# Patient Record
Sex: Female | Born: 1945
Health system: Southern US, Community
[De-identification: ages and names within clinical notes are randomized; demographics above are authoritative.]

## PROBLEM LIST (undated history)

## (undated) DIAGNOSIS — M1990 Unspecified osteoarthritis, unspecified site: Secondary | ICD-10-CM

## (undated) DIAGNOSIS — Z5189 Encounter for other specified aftercare: Secondary | ICD-10-CM

## (undated) DIAGNOSIS — J189 Pneumonia, unspecified organism: Secondary | ICD-10-CM

## (undated) DIAGNOSIS — F419 Anxiety disorder, unspecified: Secondary | ICD-10-CM

## (undated) DIAGNOSIS — K5730 Diverticulosis of large intestine without perforation or abscess without bleeding: Secondary | ICD-10-CM

## (undated) DIAGNOSIS — H04129 Dry eye syndrome of unspecified lacrimal gland: Secondary | ICD-10-CM

## (undated) DIAGNOSIS — G510 Bell's palsy: Secondary | ICD-10-CM

## (undated) DIAGNOSIS — Z780 Asymptomatic menopausal state: Secondary | ICD-10-CM

## (undated) DIAGNOSIS — I2510 Atherosclerotic heart disease of native coronary artery without angina pectoris: Secondary | ICD-10-CM

## (undated) DIAGNOSIS — N3010 Interstitial cystitis (chronic) without hematuria: Secondary | ICD-10-CM

## (undated) DIAGNOSIS — G4730 Sleep apnea, unspecified: Secondary | ICD-10-CM

## (undated) DIAGNOSIS — K219 Gastro-esophageal reflux disease without esophagitis: Secondary | ICD-10-CM

## (undated) DIAGNOSIS — K117 Disturbances of salivary secretion: Secondary | ICD-10-CM

## (undated) DIAGNOSIS — T7840XA Allergy, unspecified, initial encounter: Secondary | ICD-10-CM

## (undated) DIAGNOSIS — E119 Type 2 diabetes mellitus without complications: Secondary | ICD-10-CM

## (undated) DIAGNOSIS — M459 Ankylosing spondylitis of unspecified sites in spine: Secondary | ICD-10-CM

## (undated) DIAGNOSIS — N200 Calculus of kidney: Secondary | ICD-10-CM

## (undated) DIAGNOSIS — M7500 Adhesive capsulitis of unspecified shoulder: Secondary | ICD-10-CM

## (undated) DIAGNOSIS — M8580 Other specified disorders of bone density and structure, unspecified site: Secondary | ICD-10-CM

## (undated) DIAGNOSIS — Z90710 Acquired absence of both cervix and uterus: Secondary | ICD-10-CM

## (undated) DIAGNOSIS — I77810 Thoracic aortic ectasia: Secondary | ICD-10-CM

## (undated) DIAGNOSIS — Z87442 Personal history of urinary calculi: Secondary | ICD-10-CM

## (undated) DIAGNOSIS — I10 Essential (primary) hypertension: Secondary | ICD-10-CM

## (undated) DIAGNOSIS — I471 Supraventricular tachycardia, unspecified: Secondary | ICD-10-CM

## (undated) DIAGNOSIS — R7303 Prediabetes: Secondary | ICD-10-CM

## (undated) DIAGNOSIS — Z90722 Acquired absence of ovaries, bilateral: Secondary | ICD-10-CM

## (undated) DIAGNOSIS — Z860100 Personal history of colon polyps, unspecified: Secondary | ICD-10-CM

## (undated) DIAGNOSIS — J329 Chronic sinusitis, unspecified: Secondary | ICD-10-CM

## (undated) DIAGNOSIS — H269 Unspecified cataract: Secondary | ICD-10-CM

## (undated) DIAGNOSIS — G5600 Carpal tunnel syndrome, unspecified upper limb: Secondary | ICD-10-CM

## (undated) DIAGNOSIS — R32 Unspecified urinary incontinence: Secondary | ICD-10-CM

## (undated) DIAGNOSIS — Z1589 Genetic susceptibility to other disease: Secondary | ICD-10-CM

## (undated) DIAGNOSIS — I351 Nonrheumatic aortic (valve) insufficiency: Secondary | ICD-10-CM

## (undated) DIAGNOSIS — E785 Hyperlipidemia, unspecified: Secondary | ICD-10-CM

## (undated) DIAGNOSIS — R682 Dry mouth, unspecified: Secondary | ICD-10-CM

## (undated) DIAGNOSIS — K8681 Exocrine pancreatic insufficiency: Secondary | ICD-10-CM

## (undated) DIAGNOSIS — D126 Benign neoplasm of colon, unspecified: Secondary | ICD-10-CM

## (undated) DIAGNOSIS — E039 Hypothyroidism, unspecified: Secondary | ICD-10-CM

## (undated) DIAGNOSIS — K648 Other hemorrhoids: Secondary | ICD-10-CM

## (undated) DIAGNOSIS — N3090 Cystitis, unspecified without hematuria: Secondary | ICD-10-CM

## (undated) DIAGNOSIS — H5310 Unspecified subjective visual disturbances: Principal | ICD-10-CM

## (undated) DIAGNOSIS — Z9079 Acquired absence of other genital organ(s): Secondary | ICD-10-CM

## (undated) DIAGNOSIS — K5792 Diverticulitis of intestine, part unspecified, without perforation or abscess without bleeding: Secondary | ICD-10-CM

## (undated) HISTORY — DX: Supraventricular tachycardia, unspecified: I47.10

## (undated) HISTORY — PX: SHOULDER ARTHROSCOPY: SHX128

## (undated) HISTORY — DX: Interstitial cystitis (chronic) without hematuria: N30.10

## (undated) HISTORY — DX: Exocrine pancreatic insufficiency: K86.81

## (undated) HISTORY — DX: Type 2 diabetes mellitus without complications: E11.9

## (undated) HISTORY — DX: Atherosclerotic heart disease of native coronary artery without angina pectoris: I25.10

## (undated) HISTORY — DX: Anxiety disorder, unspecified: F41.9

## (undated) HISTORY — DX: Personal history of colon polyps, unspecified: Z86.0100

## (undated) HISTORY — DX: Hyperlipidemia, unspecified: E78.5

## (undated) HISTORY — DX: Essential (primary) hypertension: I10

## (undated) HISTORY — PX: TOTAL ABDOMINAL HYSTERECTOMY W/ BILATERAL SALPINGOOPHORECTOMY: SHX83

## (undated) HISTORY — DX: Allergy, unspecified, initial encounter: T78.40XA

## (undated) HISTORY — DX: Diverticulosis of large intestine without perforation or abscess without bleeding: K57.30

## (undated) HISTORY — DX: Personal history of colon polyps: Z86.010

## (undated) HISTORY — DX: Sleep apnea, unspecified: G47.30

## (undated) HISTORY — DX: Supraventricular tachycardia: I47.1

## (undated) HISTORY — DX: Encounter for other specified aftercare: Z51.89

## (undated) HISTORY — DX: Acquired absence of ovaries, bilateral: Z90.722

## (undated) HISTORY — DX: Acquired absence of both cervix and uterus: Z90.710

## (undated) HISTORY — DX: Acquired absence of other genital organ(s): Z90.79

## (undated) HISTORY — DX: Other hemorrhoids: K64.8

## (undated) HISTORY — DX: Carpal tunnel syndrome, unspecified upper limb: G56.00

## (undated) HISTORY — DX: Unspecified subjective visual disturbances: H53.10

## (undated) HISTORY — DX: Other specified disorders of bone density and structure, unspecified site: M85.80

## (undated) HISTORY — DX: Benign neoplasm of colon, unspecified: D12.6

## (undated) HISTORY — DX: Thoracic aortic ectasia: I77.810

## (undated) HISTORY — DX: Disturbances of salivary secretion: K11.7

## (undated) HISTORY — PX: PARTIAL HYSTERECTOMY: SHX80

## (undated) HISTORY — DX: Chronic sinusitis, unspecified: J32.9

## (undated) HISTORY — PX: COLONOSCOPY W/ POLYPECTOMY: SHX1380

## (undated) HISTORY — PX: OTHER SURGICAL HISTORY: SHX169

## (undated) HISTORY — DX: Unspecified cataract: H26.9

## (undated) HISTORY — PX: BLADDER SURGERY: SHX569

## (undated) HISTORY — DX: Gastro-esophageal reflux disease without esophagitis: K21.9

## (undated) HISTORY — DX: Asymptomatic menopausal state: Z78.0

## (undated) HISTORY — DX: Dry mouth, unspecified: R68.2

## (undated) HISTORY — DX: Nonrheumatic aortic (valve) insufficiency: I35.1

---

## 1998-08-03 ENCOUNTER — Encounter: Payer: Self-pay | Admitting: *Deleted

## 1998-08-03 ENCOUNTER — Ambulatory Visit (HOSPITAL_COMMUNITY): Admission: RE | Admit: 1998-08-03 | Discharge: 1998-08-03 | Payer: Self-pay | Admitting: *Deleted

## 1999-11-08 ENCOUNTER — Encounter: Payer: Self-pay | Admitting: Family Medicine

## 1999-11-08 ENCOUNTER — Ambulatory Visit (HOSPITAL_COMMUNITY): Admission: RE | Admit: 1999-11-08 | Discharge: 1999-11-08 | Payer: Self-pay | Admitting: Family Medicine

## 2000-01-25 ENCOUNTER — Encounter (INDEPENDENT_AMBULATORY_CARE_PROVIDER_SITE_OTHER): Payer: Self-pay | Admitting: *Deleted

## 2000-01-25 ENCOUNTER — Ambulatory Visit (HOSPITAL_BASED_OUTPATIENT_CLINIC_OR_DEPARTMENT_OTHER): Admission: RE | Admit: 2000-01-25 | Discharge: 2000-01-25 | Payer: Self-pay | Admitting: Orthopedic Surgery

## 2002-07-31 HISTORY — PX: FINGER SURGERY: SHX640

## 2003-08-03 ENCOUNTER — Encounter: Admission: RE | Admit: 2003-08-03 | Discharge: 2003-08-03 | Payer: Self-pay | Admitting: Family Medicine

## 2003-12-08 ENCOUNTER — Encounter: Payer: Self-pay | Admitting: Internal Medicine

## 2004-02-16 ENCOUNTER — Encounter: Admission: RE | Admit: 2004-02-16 | Discharge: 2004-02-16 | Payer: Self-pay | Admitting: Internal Medicine

## 2004-02-22 ENCOUNTER — Ambulatory Visit (HOSPITAL_COMMUNITY): Admission: RE | Admit: 2004-02-22 | Discharge: 2004-02-22 | Payer: Self-pay | Admitting: Gastroenterology

## 2004-03-04 ENCOUNTER — Other Ambulatory Visit: Admission: RE | Admit: 2004-03-04 | Discharge: 2004-03-04 | Payer: Self-pay | Admitting: Obstetrics and Gynecology

## 2004-04-01 ENCOUNTER — Encounter (INDEPENDENT_AMBULATORY_CARE_PROVIDER_SITE_OTHER): Payer: Self-pay | Admitting: *Deleted

## 2004-04-01 ENCOUNTER — Ambulatory Visit (HOSPITAL_COMMUNITY): Admission: RE | Admit: 2004-04-01 | Discharge: 2004-04-01 | Payer: Self-pay | Admitting: Urology

## 2004-04-01 ENCOUNTER — Ambulatory Visit (HOSPITAL_BASED_OUTPATIENT_CLINIC_OR_DEPARTMENT_OTHER): Admission: RE | Admit: 2004-04-01 | Discharge: 2004-04-01 | Payer: Self-pay | Admitting: Urology

## 2004-04-29 ENCOUNTER — Emergency Department (HOSPITAL_COMMUNITY): Admission: EM | Admit: 2004-04-29 | Discharge: 2004-04-29 | Payer: Self-pay | Admitting: Emergency Medicine

## 2004-05-01 ENCOUNTER — Emergency Department (HOSPITAL_COMMUNITY): Admission: EM | Admit: 2004-05-01 | Discharge: 2004-05-01 | Payer: Self-pay | Admitting: Emergency Medicine

## 2004-05-02 ENCOUNTER — Inpatient Hospital Stay (HOSPITAL_COMMUNITY): Admission: EM | Admit: 2004-05-02 | Discharge: 2004-05-06 | Payer: Self-pay | Admitting: Urology

## 2004-06-09 ENCOUNTER — Ambulatory Visit: Payer: Self-pay | Admitting: Internal Medicine

## 2004-08-13 ENCOUNTER — Ambulatory Visit: Payer: Self-pay | Admitting: Family Medicine

## 2004-10-01 ENCOUNTER — Ambulatory Visit: Payer: Self-pay | Admitting: Internal Medicine

## 2004-11-01 ENCOUNTER — Ambulatory Visit: Payer: Self-pay | Admitting: Internal Medicine

## 2004-11-22 ENCOUNTER — Ambulatory Visit: Payer: Self-pay | Admitting: Internal Medicine

## 2005-02-20 ENCOUNTER — Encounter: Admission: RE | Admit: 2005-02-20 | Discharge: 2005-02-20 | Payer: Self-pay | Admitting: Internal Medicine

## 2005-03-14 ENCOUNTER — Ambulatory Visit: Payer: Self-pay | Admitting: Internal Medicine

## 2005-03-16 ENCOUNTER — Other Ambulatory Visit: Admission: RE | Admit: 2005-03-16 | Discharge: 2005-03-16 | Payer: Self-pay | Admitting: Obstetrics and Gynecology

## 2005-03-17 ENCOUNTER — Ambulatory Visit: Payer: Self-pay | Admitting: Internal Medicine

## 2005-04-13 ENCOUNTER — Encounter: Admission: RE | Admit: 2005-04-13 | Discharge: 2005-07-12 | Payer: Self-pay | Admitting: Internal Medicine

## 2005-05-02 ENCOUNTER — Encounter: Payer: Self-pay | Admitting: Internal Medicine

## 2005-05-05 ENCOUNTER — Ambulatory Visit: Payer: Self-pay | Admitting: Internal Medicine

## 2005-05-15 ENCOUNTER — Ambulatory Visit: Payer: Self-pay | Admitting: Internal Medicine

## 2005-08-31 ENCOUNTER — Ambulatory Visit: Payer: Self-pay | Admitting: Internal Medicine

## 2005-11-21 ENCOUNTER — Ambulatory Visit: Payer: Self-pay | Admitting: Internal Medicine

## 2006-02-28 ENCOUNTER — Encounter: Admission: RE | Admit: 2006-02-28 | Discharge: 2006-02-28 | Payer: Self-pay | Admitting: Internal Medicine

## 2006-03-23 ENCOUNTER — Ambulatory Visit: Payer: Self-pay | Admitting: Internal Medicine

## 2006-05-23 ENCOUNTER — Ambulatory Visit: Payer: Self-pay | Admitting: Internal Medicine

## 2006-06-07 ENCOUNTER — Ambulatory Visit: Payer: Self-pay | Admitting: Internal Medicine

## 2006-06-19 ENCOUNTER — Ambulatory Visit: Payer: Self-pay | Admitting: Internal Medicine

## 2006-07-03 ENCOUNTER — Ambulatory Visit: Payer: Self-pay | Admitting: Internal Medicine

## 2006-07-23 ENCOUNTER — Ambulatory Visit: Payer: Self-pay | Admitting: Internal Medicine

## 2006-07-23 LAB — CONVERTED CEMR LAB
ALT: 32 units/L (ref 0–40)
AST: 27 units/L (ref 0–37)
BUN: 14 mg/dL (ref 6–23)
Calcium: 8.9 mg/dL (ref 8.4–10.5)
Cholesterol: 193 mg/dL (ref 0–200)
Glucose, Bld: 99 mg/dL (ref 70–99)
HCT: 39.7 % (ref 36.0–46.0)
Hemoglobin: 13.2 g/dL (ref 12.0–15.0)
Hgb A1c MFr Bld: 5.8 % (ref 4.6–6.0)
Lymphocytes Relative: 34.5 % (ref 12.0–46.0)
MCHC: 33.2 g/dL (ref 30.0–36.0)
MCV: 89.5 fL (ref 78.0–100.0)
Microalb Creat Ratio: 3.8 mg/g (ref 0.0–30.0)
Monocytes Absolute: 0.4 10*3/uL (ref 0.2–0.7)
Monocytes Relative: 9 % (ref 3.0–11.0)
Neutrophils Relative %: 52.5 % (ref 43.0–77.0)
Potassium: 4 meq/L (ref 3.5–5.1)
RDW: 12 % (ref 11.5–14.6)
Sodium: 144 meq/L (ref 135–145)
Total Bilirubin: 0.7 mg/dL (ref 0.3–1.2)
WBC: 4.8 10*3/uL (ref 4.5–10.5)

## 2006-08-16 ENCOUNTER — Ambulatory Visit: Payer: Self-pay | Admitting: Internal Medicine

## 2006-12-08 ENCOUNTER — Ambulatory Visit: Payer: Self-pay | Admitting: Family Medicine

## 2006-12-20 ENCOUNTER — Encounter: Payer: Self-pay | Admitting: Internal Medicine

## 2006-12-20 ENCOUNTER — Ambulatory Visit: Payer: Self-pay | Admitting: Internal Medicine

## 2007-01-25 ENCOUNTER — Ambulatory Visit: Payer: Self-pay | Admitting: Internal Medicine

## 2007-01-25 DIAGNOSIS — M5416 Radiculopathy, lumbar region: Secondary | ICD-10-CM | POA: Insufficient documentation

## 2007-01-28 ENCOUNTER — Encounter: Admission: RE | Admit: 2007-01-28 | Discharge: 2007-01-28 | Payer: Self-pay | Admitting: Internal Medicine

## 2007-01-28 ENCOUNTER — Telehealth (INDEPENDENT_AMBULATORY_CARE_PROVIDER_SITE_OTHER): Payer: Self-pay | Admitting: *Deleted

## 2007-02-04 ENCOUNTER — Encounter: Payer: Self-pay | Admitting: Internal Medicine

## 2007-02-05 ENCOUNTER — Encounter (INDEPENDENT_AMBULATORY_CARE_PROVIDER_SITE_OTHER): Payer: Self-pay | Admitting: *Deleted

## 2007-02-08 ENCOUNTER — Telehealth: Payer: Self-pay | Admitting: Internal Medicine

## 2007-03-05 ENCOUNTER — Encounter: Admission: RE | Admit: 2007-03-05 | Discharge: 2007-03-05 | Payer: Self-pay | Admitting: Internal Medicine

## 2007-04-05 ENCOUNTER — Encounter: Admission: RE | Admit: 2007-04-05 | Discharge: 2007-04-05 | Payer: Self-pay | Admitting: Obstetrics and Gynecology

## 2007-05-29 ENCOUNTER — Ambulatory Visit: Payer: Self-pay | Admitting: Internal Medicine

## 2007-06-03 ENCOUNTER — Telehealth (INDEPENDENT_AMBULATORY_CARE_PROVIDER_SITE_OTHER): Payer: Self-pay | Admitting: *Deleted

## 2007-06-05 ENCOUNTER — Telehealth (INDEPENDENT_AMBULATORY_CARE_PROVIDER_SITE_OTHER): Payer: Self-pay | Admitting: *Deleted

## 2007-06-26 ENCOUNTER — Ambulatory Visit: Payer: Self-pay | Admitting: Internal Medicine

## 2007-07-10 ENCOUNTER — Ambulatory Visit: Payer: Self-pay | Admitting: Internal Medicine

## 2007-07-10 LAB — CONVERTED CEMR LAB
Blood in Urine, dipstick: NEGATIVE
Ketones, urine, test strip: NEGATIVE
Nitrite: NEGATIVE
Protein, U semiquant: NEGATIVE
Urobilinogen, UA: 0.2

## 2007-08-21 ENCOUNTER — Ambulatory Visit: Payer: Self-pay | Admitting: Internal Medicine

## 2007-08-21 DIAGNOSIS — E1169 Type 2 diabetes mellitus with other specified complication: Secondary | ICD-10-CM | POA: Insufficient documentation

## 2007-08-21 DIAGNOSIS — I10 Essential (primary) hypertension: Secondary | ICD-10-CM | POA: Insufficient documentation

## 2007-08-21 DIAGNOSIS — M2550 Pain in unspecified joint: Secondary | ICD-10-CM | POA: Insufficient documentation

## 2007-08-21 DIAGNOSIS — I152 Hypertension secondary to endocrine disorders: Secondary | ICD-10-CM | POA: Insufficient documentation

## 2007-08-21 DIAGNOSIS — E785 Hyperlipidemia, unspecified: Secondary | ICD-10-CM | POA: Insufficient documentation

## 2007-08-21 DIAGNOSIS — E782 Mixed hyperlipidemia: Secondary | ICD-10-CM

## 2007-08-22 ENCOUNTER — Encounter (INDEPENDENT_AMBULATORY_CARE_PROVIDER_SITE_OTHER): Payer: Self-pay | Admitting: *Deleted

## 2007-09-03 ENCOUNTER — Encounter (INDEPENDENT_AMBULATORY_CARE_PROVIDER_SITE_OTHER): Payer: Self-pay | Admitting: *Deleted

## 2007-09-03 ENCOUNTER — Ambulatory Visit: Payer: Self-pay | Admitting: Internal Medicine

## 2007-09-25 ENCOUNTER — Ambulatory Visit: Payer: Self-pay | Admitting: Internal Medicine

## 2008-01-10 ENCOUNTER — Inpatient Hospital Stay (HOSPITAL_COMMUNITY): Admission: EM | Admit: 2008-01-10 | Discharge: 2008-01-10 | Payer: Self-pay | Admitting: Emergency Medicine

## 2008-01-10 ENCOUNTER — Ambulatory Visit: Payer: Self-pay | Admitting: Internal Medicine

## 2008-01-15 ENCOUNTER — Ambulatory Visit: Payer: Self-pay | Admitting: Internal Medicine

## 2008-01-15 DIAGNOSIS — M940 Chondrocostal junction syndrome [Tietze]: Secondary | ICD-10-CM | POA: Insufficient documentation

## 2008-03-05 ENCOUNTER — Encounter: Admission: RE | Admit: 2008-03-05 | Discharge: 2008-03-05 | Payer: Self-pay | Admitting: Obstetrics and Gynecology

## 2008-04-03 ENCOUNTER — Telehealth: Payer: Self-pay | Admitting: Internal Medicine

## 2008-04-03 ENCOUNTER — Telehealth: Payer: Self-pay | Admitting: Gastroenterology

## 2008-04-15 ENCOUNTER — Ambulatory Visit: Payer: Self-pay | Admitting: Internal Medicine

## 2008-04-21 ENCOUNTER — Ambulatory Visit: Payer: Self-pay | Admitting: Gastroenterology

## 2008-05-08 ENCOUNTER — Telehealth: Payer: Self-pay | Admitting: Gastroenterology

## 2008-05-12 ENCOUNTER — Encounter: Payer: Self-pay | Admitting: Gastroenterology

## 2008-05-12 ENCOUNTER — Ambulatory Visit: Payer: Self-pay | Admitting: Gastroenterology

## 2008-05-12 LAB — HM COLONOSCOPY

## 2008-05-14 ENCOUNTER — Encounter: Payer: Self-pay | Admitting: Gastroenterology

## 2008-05-15 ENCOUNTER — Telehealth (INDEPENDENT_AMBULATORY_CARE_PROVIDER_SITE_OTHER): Payer: Self-pay | Admitting: *Deleted

## 2008-06-02 ENCOUNTER — Telehealth: Payer: Self-pay | Admitting: Gastroenterology

## 2008-11-27 ENCOUNTER — Encounter: Payer: Self-pay | Admitting: Internal Medicine

## 2009-01-11 ENCOUNTER — Encounter: Payer: Self-pay | Admitting: Internal Medicine

## 2009-02-03 ENCOUNTER — Telehealth (INDEPENDENT_AMBULATORY_CARE_PROVIDER_SITE_OTHER): Payer: Self-pay | Admitting: *Deleted

## 2009-03-08 ENCOUNTER — Encounter: Admission: RE | Admit: 2009-03-08 | Discharge: 2009-03-08 | Payer: Self-pay | Admitting: Internal Medicine

## 2009-03-24 ENCOUNTER — Encounter: Payer: Self-pay | Admitting: Internal Medicine

## 2009-04-12 ENCOUNTER — Encounter: Payer: Self-pay | Admitting: Internal Medicine

## 2009-04-15 ENCOUNTER — Encounter: Payer: Self-pay | Admitting: Internal Medicine

## 2009-04-30 ENCOUNTER — Ambulatory Visit: Payer: Self-pay | Admitting: Internal Medicine

## 2009-04-30 DIAGNOSIS — K219 Gastro-esophageal reflux disease without esophagitis: Secondary | ICD-10-CM | POA: Insufficient documentation

## 2009-04-30 DIAGNOSIS — Z86010 Personal history of colonic polyps: Secondary | ICD-10-CM | POA: Insufficient documentation

## 2009-04-30 DIAGNOSIS — M8580 Other specified disorders of bone density and structure, unspecified site: Secondary | ICD-10-CM | POA: Insufficient documentation

## 2009-04-30 DIAGNOSIS — K5730 Diverticulosis of large intestine without perforation or abscess without bleeding: Secondary | ICD-10-CM | POA: Insufficient documentation

## 2009-05-04 ENCOUNTER — Encounter (INDEPENDENT_AMBULATORY_CARE_PROVIDER_SITE_OTHER): Payer: Self-pay | Admitting: *Deleted

## 2009-05-06 ENCOUNTER — Encounter: Admission: RE | Admit: 2009-05-06 | Discharge: 2009-05-06 | Payer: Self-pay | Admitting: Internal Medicine

## 2009-05-06 ENCOUNTER — Encounter: Payer: Self-pay | Admitting: Internal Medicine

## 2009-05-07 ENCOUNTER — Encounter (INDEPENDENT_AMBULATORY_CARE_PROVIDER_SITE_OTHER): Payer: Self-pay | Admitting: *Deleted

## 2009-05-11 ENCOUNTER — Ambulatory Visit: Payer: Self-pay | Admitting: Internal Medicine

## 2009-05-24 ENCOUNTER — Encounter: Payer: Self-pay | Admitting: Internal Medicine

## 2009-05-25 ENCOUNTER — Ambulatory Visit: Payer: Self-pay | Admitting: Internal Medicine

## 2009-05-25 DIAGNOSIS — R109 Unspecified abdominal pain: Secondary | ICD-10-CM | POA: Insufficient documentation

## 2009-05-27 ENCOUNTER — Telehealth (INDEPENDENT_AMBULATORY_CARE_PROVIDER_SITE_OTHER): Payer: Self-pay | Admitting: *Deleted

## 2009-05-27 ENCOUNTER — Encounter (INDEPENDENT_AMBULATORY_CARE_PROVIDER_SITE_OTHER): Payer: Self-pay | Admitting: *Deleted

## 2009-05-27 LAB — CONVERTED CEMR LAB
Basophils Relative: 0.9 % (ref 0.0–3.0)
HCT: 40.3 % (ref 36.0–46.0)
Hemoglobin: 13.8 g/dL (ref 12.0–15.0)
Lymphocytes Relative: 33.3 % (ref 12.0–46.0)
MCHC: 34.3 g/dL (ref 30.0–36.0)
MCV: 90.8 fL (ref 78.0–100.0)
Neutro Abs: 3.6 10*3/uL (ref 1.4–7.7)
RDW: 12.2 % (ref 11.5–14.6)

## 2009-05-31 ENCOUNTER — Encounter (INDEPENDENT_AMBULATORY_CARE_PROVIDER_SITE_OTHER): Payer: Self-pay | Admitting: *Deleted

## 2009-05-31 ENCOUNTER — Ambulatory Visit: Payer: Self-pay | Admitting: Internal Medicine

## 2009-05-31 LAB — CONVERTED CEMR LAB
OCCULT 2: NEGATIVE
OCCULT 3: NEGATIVE

## 2009-06-25 ENCOUNTER — Ambulatory Visit: Payer: Self-pay | Admitting: Internal Medicine

## 2009-07-01 ENCOUNTER — Telehealth: Payer: Self-pay | Admitting: Internal Medicine

## 2009-07-05 ENCOUNTER — Encounter: Payer: Self-pay | Admitting: Internal Medicine

## 2009-07-12 ENCOUNTER — Ambulatory Visit: Payer: Self-pay | Admitting: Internal Medicine

## 2009-07-12 DIAGNOSIS — M542 Cervicalgia: Secondary | ICD-10-CM | POA: Insufficient documentation

## 2009-07-13 ENCOUNTER — Encounter (INDEPENDENT_AMBULATORY_CARE_PROVIDER_SITE_OTHER): Payer: Self-pay | Admitting: *Deleted

## 2009-07-20 ENCOUNTER — Encounter: Payer: Self-pay | Admitting: Internal Medicine

## 2009-07-20 ENCOUNTER — Encounter: Admission: RE | Admit: 2009-07-20 | Discharge: 2009-07-29 | Payer: Self-pay | Admitting: Internal Medicine

## 2009-07-29 ENCOUNTER — Encounter: Admission: RE | Admit: 2009-07-29 | Discharge: 2009-09-09 | Payer: Self-pay | Admitting: Geriatric Medicine

## 2009-08-16 ENCOUNTER — Ambulatory Visit: Payer: Self-pay | Admitting: Internal Medicine

## 2009-08-16 DIAGNOSIS — M545 Low back pain, unspecified: Secondary | ICD-10-CM | POA: Insufficient documentation

## 2009-08-16 LAB — CONVERTED CEMR LAB
Blood in Urine, dipstick: NEGATIVE
Glucose, Urine, Semiquant: NEGATIVE
Protein, U semiquant: NEGATIVE
Specific Gravity, Urine: 1.02
Urobilinogen, UA: 0.2
pH: 6

## 2009-08-24 ENCOUNTER — Encounter: Admission: RE | Admit: 2009-08-24 | Discharge: 2009-09-09 | Payer: Self-pay | Admitting: Internal Medicine

## 2009-09-02 ENCOUNTER — Encounter: Payer: Self-pay | Admitting: Internal Medicine

## 2009-10-27 ENCOUNTER — Encounter: Payer: Self-pay | Admitting: Internal Medicine

## 2009-11-03 ENCOUNTER — Encounter: Payer: Self-pay | Admitting: Internal Medicine

## 2009-11-12 ENCOUNTER — Encounter: Admission: RE | Admit: 2009-11-12 | Discharge: 2009-11-12 | Payer: Self-pay | Admitting: Obstetrics and Gynecology

## 2009-11-12 ENCOUNTER — Ambulatory Visit: Payer: Self-pay | Admitting: Internal Medicine

## 2009-11-15 LAB — CONVERTED CEMR LAB: AST: 30 units/L (ref 0–37)

## 2009-12-03 ENCOUNTER — Ambulatory Visit: Payer: Self-pay | Admitting: Internal Medicine

## 2009-12-03 DIAGNOSIS — B353 Tinea pedis: Secondary | ICD-10-CM | POA: Insufficient documentation

## 2009-12-13 ENCOUNTER — Encounter: Payer: Self-pay | Admitting: Internal Medicine

## 2010-02-04 ENCOUNTER — Telehealth (INDEPENDENT_AMBULATORY_CARE_PROVIDER_SITE_OTHER): Payer: Self-pay | Admitting: *Deleted

## 2010-03-16 ENCOUNTER — Encounter: Admission: RE | Admit: 2010-03-16 | Discharge: 2010-03-16 | Payer: Self-pay | Admitting: Obstetrics and Gynecology

## 2010-04-28 ENCOUNTER — Encounter: Payer: Self-pay | Admitting: Internal Medicine

## 2010-05-03 ENCOUNTER — Ambulatory Visit: Payer: Self-pay | Admitting: Internal Medicine

## 2010-06-01 ENCOUNTER — Encounter: Payer: Self-pay | Admitting: Internal Medicine

## 2010-06-27 ENCOUNTER — Encounter: Payer: Self-pay | Admitting: Internal Medicine

## 2010-06-27 ENCOUNTER — Ambulatory Visit: Payer: Self-pay | Admitting: Internal Medicine

## 2010-06-27 DIAGNOSIS — K117 Disturbances of salivary secretion: Secondary | ICD-10-CM | POA: Insufficient documentation

## 2010-06-27 DIAGNOSIS — E559 Vitamin D deficiency, unspecified: Secondary | ICD-10-CM | POA: Insufficient documentation

## 2010-06-27 DIAGNOSIS — H04129 Dry eye syndrome of unspecified lacrimal gland: Secondary | ICD-10-CM | POA: Insufficient documentation

## 2010-06-27 DIAGNOSIS — M25519 Pain in unspecified shoulder: Secondary | ICD-10-CM | POA: Insufficient documentation

## 2010-06-27 LAB — CONVERTED CEMR LAB
Cholesterol, target level: 200 mg/dL
HDL goal, serum: 40 mg/dL
LDL Goal: 130 mg/dL

## 2010-06-29 LAB — CONVERTED CEMR LAB
ALT: 35 units/L (ref 0–35)
Alkaline Phosphatase: 80 units/L (ref 39–117)
BUN: 22 mg/dL (ref 6–23)
Bilirubin, Direct: 0.1 mg/dL (ref 0.0–0.3)
CO2: 28 meq/L (ref 19–32)
Calcium: 9.7 mg/dL (ref 8.4–10.5)
Hemoglobin: 12.9 g/dL (ref 12.0–15.0)
Lymphocytes Relative: 32.5 % (ref 12.0–46.0)
MCHC: 34.6 g/dL (ref 30.0–36.0)
Monocytes Absolute: 0.5 10*3/uL (ref 0.1–1.0)
Monocytes Relative: 7.6 % (ref 3.0–12.0)
Neutrophils Relative %: 56.4 % (ref 43.0–77.0)
Sed Rate: 15 mm/hr (ref 0–22)
Sodium: 143 meq/L (ref 135–145)
TSH: 3.08 microintl units/mL (ref 0.35–5.50)
Total Bilirubin: 0.6 mg/dL (ref 0.3–1.2)

## 2010-08-17 ENCOUNTER — Encounter: Payer: Self-pay | Admitting: Internal Medicine

## 2010-08-28 LAB — CONVERTED CEMR LAB
ALT: 63 units/L — ABNORMAL HIGH (ref 0–35)
AST: 45 units/L — ABNORMAL HIGH (ref 0–37)
Albumin: 4 g/dL (ref 3.5–5.2)
Albumin: 4.4 g/dL (ref 3.5–5.2)
Alkaline Phosphatase: 90 units/L (ref 39–117)
BUN: 15 mg/dL (ref 6–23)
BUN: 16 mg/dL (ref 6–23)
Bilirubin, Direct: 0.1 mg/dL (ref 0.0–0.3)
CO2: 29 meq/L (ref 19–32)
Calcium: 9.6 mg/dL (ref 8.4–10.5)
Chloride: 107 meq/L (ref 96–112)
Cholesterol: 209 mg/dL (ref 0–200)
Creatinine, Ser: 1.1 mg/dL (ref 0.4–1.2)
Direct LDL: 122.9 mg/dL
Eosinophils Absolute: 0.2 10*3/uL (ref 0.0–0.6)
Eosinophils Absolute: 0.2 10*3/uL (ref 0.0–0.7)
Eosinophils Relative: 2.5 % (ref 0.0–5.0)
GFR calc Af Amer: 81 mL/min
Glucose, Bld: 100 mg/dL — ABNORMAL HIGH (ref 70–99)
Glucose, Bld: 94 mg/dL (ref 70–99)
HCT: 39.3 % (ref 36.0–46.0)
HDL: 44.9 mg/dL (ref >39.0)
Hemoglobin: 13.1 g/dL (ref 12.0–15.0)
Hgb A1c MFr Bld: 5.8 % (ref 4.6–6.5)
Hgb A1c MFr Bld: 6.2 % — ABNORMAL HIGH (ref 4.6–6.0)
MCHC: 34.2 g/dL (ref 30.0–36.0)
Monocytes Absolute: 0.4 10*3/uL (ref 0.1–1.0)
Monocytes Relative: 6.6 % (ref 3.0–11.0)
Monocytes Relative: 7.1 % (ref 3.0–12.0)
Neutro Abs: 3.4 10*3/uL (ref 1.4–7.7)
Neutro Abs: 3.6 10*3/uL (ref 1.4–7.7)
Neutrophils Relative %: 61.6 % (ref 43.0–77.0)
Platelets: 260 10*3/uL (ref 150.0–400.0)
RDW: 11.9 % (ref 11.5–14.6)
RDW: 11.9 % (ref 11.5–14.6)
Rhuematoid fact SerPl-aCnc: 49.1 intl units/mL — ABNORMAL HIGH (ref 0.0–20.0)
TSH: 2.09 microintl units/mL (ref 0.35–5.50)
Total CHOL/HDL Ratio: 4.7
Total Protein: 7.1 g/dL (ref 6.0–8.3)
Total Protein: 7.9 g/dL (ref 6.0–8.3)
Triglycerides: 273 mg/dL (ref 0–149)
VLDL: 55 mg/dL — ABNORMAL HIGH (ref 0–40)

## 2010-09-01 NOTE — Letter (Signed)
Summary: [facility]   Imported By: Lanelle Bal 12/08/2009 14:03:28  _____________________________________________________________________  External Attachment:    Type:   Image     Comment:   External Document

## 2010-09-01 NOTE — Letter (Signed)
Summary: CMN for TENS Unit/[employer]  CMN for TENS Unit/Medical [facility]   Imported By: Lanelle Bal 12/21/2009 12:02:01  _____________________________________________________________________  External Attachment:    Type:   Image     Comment:   External Document

## 2010-09-01 NOTE — Letter (Signed)
Summary: [facility]   Imported By: Lanelle Bal 12/08/2009 13:11:35  _____________________________________________________________________  External Attachment:    Type:   Image     Comment:   External Document

## 2010-09-01 NOTE — Assessment & Plan Note (Signed)
Summary: YEARLY EXAM----WILL TRY TO FAST///SPH   Vital Signs:  Patient profile:   65 year old female Height:      62.5 inches Weight:      172.4 pounds BMI:     31.14 Temp:     97.6 degrees F oral Pulse rate:   60 / minute Resp:     14 per minute BP sitting:   128 / 82  (left arm) Cuff size:   large  Vitals Entered By: Shonna Chock CMA (June 27, 2010 1:07 PM) CC: CPX with fasting labs , Lipid Management, Shoulder pain   Primary Care Cadan Maggart:  Marga Melnick, MD  CC:  CPX with fasting labs , Lipid Management, and Shoulder pain.  History of Present Illness:      Here for Medicare AWV: 1.Risk factors based on Past M, S, F history:HTN; Dyslipidemia ; GERD; Osteopenia ( chart updated). New issue is  shoulder pain( actually recurring issue). 2.Physical Activities:walking irregularly  3.Depression/mood: positive responses to screening questions ( see scanned document) 4.Hearing::  intermittent issues especially in crowds(see responses) 5.ADL's: no limitations 6.Fall Risk: none 7.Home Safety: no issues 8.Height, weight, &visual acuity:wall chart read @ 6 ft 9.Counseling: POA & Living Will discussed 10.Labs ordered based on risk factors: see Orders 11. Referral Coordination: Audiology referral discussed; re-evaluation by Derm for eyelid & feet issues recommended 12.Care Plan: see Instructions 13. Cognitive Assessment: Orient ed X 3; memory & recall  excellent ;"WORLD " spelled backwards; mood & affect normal.       Hyperlipidemia Follow-Up   :The patient reports muscle aches, flushing, and fatigue, but denies itching, constipation, and diarrhea.  Dietary compliance has been good  to  fair ( "emotional eater").  Adjunctive measures currently used by the patient include ASA.  Lipids 04/28/2010  from Gyn reviewed  : LDL 119 (NMR goal = < 130. Shoulder Pain      The patient also presents with Shoulder pain, exacerbated in past month ; but originally in 1999  immediately post  oophorectomy & bladder repair.  The patient reports numbness, weakness, &  tingling in both hands and impaired  L shouder ROM, but denies locking, stiffness, swelling, and redness.   The pain began acutely and without injury other than possibly positioning for surgery.  The patient describes the pain as constant and aching.  The pain is better with NSAIDS.  The pain is worse with activity, specifically reaching in certain directions..    Lipid Management History:      Positive NCEP/ATP III risk factors include female age 71 years old or older and hypertension.  Negative NCEP/ATP III risk factors include no history of early menopause without estrogen hormone replacement, non-diabetic, no family history for ischemic heart disease, non-tobacco-user status, no ASHD (atherosclerotic heart disease), no prior stroke/TIA, no peripheral vascular disease, and no history of aortic aneurysm.     Preventive Screening-Counseling & Management  Alcohol-Tobacco     Alcohol drinks/day: 0     Smoking Status: never  Caffeine-Diet-Exercise     Caffeine use/day: tea 4 cups / day, usuay decaf     Does Patient Exercise: no  Hep-HIV-STD-Contraception     Dental Visit-last 6 months yes     Sun Exposure-Excessive: no  Safety-Violence-Falls     Seat Belt Use: yes     Smoke Detectors: yes      Blood Transfusions:  after 2001 and post cystoscopy UTI with severe hematuria 2005.        Travel History:  [place] 1990.    Current Medications (verified): 1)  Singulair 10 Mg Tabs (Montelukast Sodium) .... Take One Tablet By Mouth Daily 2)  Toprol Xl 50 Mg Tb24 (Metoprolol Succinate) .... Take One Half Tablet By Mouth Daily 3)  Azelastine Hcl 137 Mcg/spray Soln (Azelastine Hcl) .Marland Kitchen.. 1-2 Sprays Once Daily 4)  Aspirin Ec 81 Mg Tbec (Aspirin) .... Take 1 Tablet By Mouth Every Morning 5)  Furosemide 40 Mg  Tabs (Furosemide) .... 1/2 Tablet Daily 6)  Advil 200 Mg Caps (Ibuprofen) .... As Needed 7)  Citracal/vit D 1000units  .... 2 By Mouth Once Daily 8)  Vagifem 10 Mcg Tabs (Estradiol) .Marland Kitchen.. 1-2 X Weekly 9)  Align  Caps (Probiotic Product) .Marland Kitchen.. 1 By Mouth Once Daily As Needed 10)  Lidoderm 5 % Ptch (Lidocaine) .Marland Kitchen.. 12 Hours On, 12 Hours Off As Needed 11)  Protopic 0.1 % Oint (Tacrolimus) .... Apply To Eyelids As Needed 12)  Vitamin E 1000 Unit Caps (Vitamin E) .Marland Kitchen.. 1 By Mouth Once Daily 13)  Ketoconazole 2 % Crea (Ketoconazole) .... Apply Two Times A Day To Feet  Allergies: 1)  ! Phenergan 2)  ! * Nitro 3)  ! * Lisinopril 4)  ! * Elmiron 5)  ! Septra  Past History:  Past Medical History: Hypertension back pain with radiculopathy, PMH of + RA factor 2004 , Dr Jimmy Footman, Rheu allergies GERD Hyperlipidemia: NMR Lipoprofile 2005: LDL 138( 1393/501), HDL 49, TG 168. LDL goal = < 130.  [place] Study LDL goal = < 130. Osteopenia Colonic polyps, PMH  of Diverticulosis, colon Interstitial Cystitis ; DDD LS spine; Xerostomia  & Xeroophthalmia ; Vitamin D deficiency, Dr Arelia Sneddon  Past Surgical History: Hysterectomy with bilateral oophorectomy for dysfunctional menses bladder surgery for incontinence gravida  one para one colonoscopy: tics 10/2002, interstitial cystitis with clot in catheter elevated LFTs  with elmiron Colon polypectomy, adenomatous poyp   04/2008, Dr  Wendall Papa  Endoscopy 04/2008 : negative  Family History: Father: cirrhosis (non alcoholic),DM Mother: CAD,HTN, osteoporosis,asthma,dementia, thyroid disease,mini CVAs Siblings: sister colon cancer;2  P aunts:  renal cancer;CAD in maternal FH  Social History: Never Smoked No  specific diet married, one daughter, retired Runner, broadcasting/film/video, does not drink alcohol. Regular exercise-no Does Patient Exercise:  no Caffeine use/day:  tea 4 cups / day, usuay decaf Dental Care w/in 6 mos.:  yes Sun Exposure-Excessive:  no Seat Belt Use:  yes Blood Transfusions:  after 2001, post cystoscopy UTI with severe hematuria 2005  Review of Systems        The patient complains of incontinence.  The patient denies anorexia, fever, vision loss, decreased hearing, chest pain, syncope, dyspnea on exertion, peripheral edema, headaches, hemoptysis, abdominal pain, melena, hematochezia, severe indigestion/heartburn, hematuria, suspicious skin lesions, unusual weight change, abnormal bleeding, enlarged lymph nodes, and angioedema.         Occasional hoarseness. Weight varies ; "i'm an emtional eater". Dry  nocturanal cough as per husband. Xerostomia & xero-ophthalmia. Ophth exam negative.  Physical Exam  General:  ,well-nourished; alert,appropriate and cooperative throughout examination Head:  Normocephalic and atraumatic without obvious abnormalities. No  alopecia . Eyes:  No corneal or conjunctival inflammation noted. Perrla. Funduscopic exam benign, without hemorrhages, exudates or papilledema.  Ears:  External ear exam shows no significant lesions or deformities.  Otoscopic examination reveals clear canals, tympanic membranes are intact bilaterally without bulging, retraction, inflammation or discharge. Hearing is grossly normal bilaterally. Nose:  External nasal examination shows no deformity or inflammation. Nasal mucosa are pink  and moist without lesions or exudates. Mouth:  Oral mucosa and oropharynx without lesions or exudates.  Teeth in good repair. Neck:  No deformities, masses, or tenderness noted. Lungs:  Normal respiratory effort, chest expands symmetrically. Lungs are clear to auscultation, no crackles or wheezes. Heart:  normal rate, regular rhythm, no gallop, no rub, no JVD, no HJR, and grade 1 /6 systolic murmur.   Abdomen:  Bowel sounds positive,abdomen soft and non-tender without masses, organomegaly or hernias noted. Genitalia:  Julio Sicks, NP / Dr Arelia Sneddon( [facility] of [facility]) Msk:  No deformity or scoliosis noted of thoracic or lumbar spine.   Pulses:  R and L carotid,radial,dorsalis pedis and posterior tibial pulses are full and equal  bilaterally. Venous spiders Extremities:  No clubbing, cyanosis, edema, or deformity noted .  Pain with certain passive LUE positioning, especially elevation & posterior rotation Neurologic:  alert & oriented X3 and DTRs symmetrical and normal.   Skin:  Intact without suspicious lesions or rashes. Exfoliative changes R great toe Cervical Nodes:  No lymphadenopathy noted Axillary Nodes:  No palpable lymphadenopathy Psych:  memory intact for recent and remote, normally interactive, good eye contact, and not depressed appearing ( immaculate & well groomed)     Impression & Recommendations:  Problem # 1:  PREVENTIVE HEALTH CARE (ICD-V70.0)  Orders: Medicare -1st Annual Wellness Visit [phone])  Problem # 2:  SHOULDER PAIN, LEFT (ICD-719.41)  recurrence The following medications were removed from the medication list:    Tramadol Hcl 50 Mg Tabs (Tramadol hcl) .Marland Kitchen... 1 by mouth every 6 hours as needed Her updated medication list for this problem includes:    Aspirin Ec 81 Mg Tbec (Aspirin) .Marland Kitchen... Take 1 tablet by mouth every morning    Advil 200 Mg Caps (Ibuprofen) .Marland Kitchen... As needed  Orders: Orthopedic Referral (Ortho)  Problem # 3:  HYPERTENSION, ESSENTIAL NOS (ICD-401.9)  controlled Her updated medication list for this problem includes:    Toprol Xl 50 Mg Tb24 (Metoprolol succinate) .Marland Kitchen... Take one half tablet by mouth daily    Furosemide 40 Mg Tabs (Furosemide) .Marland Kitchen... 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 Take one tablet by mouth daily 2)  Toprol Xl 50 Mg Tb24 (Metoprolol succinate) .... Take one half tablet by mouth daily 3)  Azelastine Hcl 137 Mcg/spray Soln (Azelastine hcl) .Marland Kitchen.. 1-2 sprays once daily 4)  Aspirin Ec 81 Mg Tbec (Aspirin) .... Take 1 tablet by mouth every morning 5)  Furosemide 40 Mg Tabs (Furosemide) .... 1/2 tablet daily 6)  Advil 200 Mg Caps (Ibuprofen) .... As needed 7)  Citracal/vit D 1000units  .... 2 by mouth once daily 8)  Vagifem 10 Mcg Tabs (Estradiol) .Marland Kitchen.. 1-2 x weekly 9)  Align Caps (Probiotic product) .Marland Kitchen.. 1 by mouth once daily as needed 10)  Lidoderm 5 % Ptch (Lidocaine) .Marland Kitchen.. 12 hours on, 12 hours off as needed 11)  Protopic 0.1 % Oint (Tacrolimus) .... Apply to eyelids as needed 12)  Vitamin E 1000 Unit Caps (Vitamin e) .Marland KitchenMarland KitchenMarland Kitchen 1  by mouth once daily 13)  Ketoconazole 2 % Crea (Ketoconazole) .... Apply two times a day to feet  Other Orders: TLB-Hepatic/Liver Function Pnl (80076-HEPATIC) TLB-TSH (Thyroid Stimulating Hormone) (84443-TSH) Tdap => 35yrs IM (84696) Admin 1st Vaccine (29528)  Lipid Assessment/Plan:      Based on NCEP/ATP III, the patient's risk factor category is "0-1 risk factors".  The patient's lipid goals are as follows: Total cholesterol goal is 200; LDL cholesterol goal is 130; HDL cholesterol goal is 40; Triglyceride goal is 150.    Patient Instructions: 1)  Discuss your dry  eyes with Ophthalmologist. Verify status your POA & Living Will are in place. See Dermatologist  concerning  foot rash & eyelid changes. Consider Audiology evaluation. 2)  Check your Blood Pressure regularly. If it is above: 135/85 ON AVERAGE  you should make an appointment. Prescriptions: AZELASTINE HCL 137 MCG/SPRAY SOLN (AZELASTINE HCL) 1-2 sprays once daily  #3 months x 3   Entered and Authorized by:   Marga Melnick MD   Signed by:    Marga Melnick MD on 06/27/2010   Method used:   Print then Give to Patient   RxID:   [id number]    Orders Added: 1)  Medicare -1st Annual Wellness Visit [G0438] 2)  Est. Patient Level III [44034] 3)  Venipuncture [36415] 4)  TLB-BMP (Basic Metabolic Panel-BMET) [80048-METABOL] 5)  TLB-CBC Platelet - w/Differential [85025-CBCD] 6)  TLB-Hepatic/Liver Function Pnl [80076-HEPATIC] 7)  TLB-TSH (Thyroid Stimulating Hormone) [84443-TSH] 8)  TLB-Sedimentation Rate (ESR) [85652-ESR] 9)  EKG w/ Interpretation [93000] 10)  Specimen Handling [99000] 11)  Orthopedic Referral [Ortho] 12)  Tdap => 73yrs IM [90715] 13)  Admin 1st Vaccine [74259]   Immunizations Administered:  Tetanus Vaccine:    Vaccine Type: Tdap    Site: right deltoid    Mfr: GlaxoSmithKline    Dose: 0.5 ml    Route: [place]    Given by: Shonna Chock CMA    Exp. Date: 05/19/2012    Lot #: [id number]    VIS given: 06/17/08 version given June 27, 2010.   Immunizations Administered:  Tetanus Vaccine:    Vaccine Type: Tdap    Site: right deltoid    Mfr: GlaxoSmithKline    Dose: 0.5 ml    Route: [place]    Given by: Shonna Chock CMA    Exp. Date: 05/19/2012    Lot #: [id number]    VIS given: 06/17/08 version given June 27, 2010.

## 2010-09-01 NOTE — Progress Notes (Signed)
Summary: GENERIC REQUEST  Phone Note From Pharmacy   Caller: [facility] [address] Summary of Call: PHARMACY SENDS A FAX STATING THAT A NEW GENERIC IS AVAILABLE FOR ASTELIN [place] 137 MCG SPR MEDA. THEY ARE REQUESTING TO USE THE GENERIC. PLEASE ADVISE. PHONE [phone] / FAX [phone]. Initial call taken by: Lavell Islam,  February 04, 2010 3:18 PM  Follow-up for Phone Call        Generic OK X 1 year Follow-up by: Marga Melnick MD,  February 06, 2010 2:07 PM  Additional Follow-up for Phone Call Additional follow up Details #1::        I called the pharmacy and indicated per Dr.Hopper ok to fill generic x 1 year Additional Follow-up by: Shonna Chock,  February 07, 2010 3:26 PM    New/Updated Medications: AZELASTINE HCL 137 MCG/SPRAY SOLN (AZELASTINE HCL) 1-2 sprays once daily Prescriptions: AZELASTINE HCL 137 MCG/SPRAY SOLN (AZELASTINE HCL) 1-2 sprays once daily  #1 x 11   Entered by:   Shonna Chock   Authorized by:   Marga Melnick MD   Signed by:   Shonna Chock on 02/07/2010   Method used:   Historical   RxID:   [id number]

## 2010-09-01 NOTE — Letter (Signed)
Summary: CMN for TENS/[employer]  CMN for TENS/[employer]   Imported By: Lanelle Bal 09/16/2009 09:37:19  _____________________________________________________________________  External Attachment:    Type:   Image     Comment:   External Document

## 2010-09-01 NOTE — Letter (Signed)
Summary: [facility]   Imported By: Lanelle Bal 12/08/2009 14:00:42  _____________________________________________________________________  External Attachment:    Type:   Image     Comment:   External Document

## 2010-09-01 NOTE — Letter (Signed)
Summary: [facility]  [facility]   Imported By: Lanelle Bal 11/02/2009 09:39:08  _____________________________________________________________________  External Attachment:    Type:   Image     Comment:   External Document

## 2010-09-01 NOTE — Assessment & Plan Note (Signed)
Summary: FLU SHOT///SPH   Nurse Visit  CC: Flu shot./kb   Allergies: 1)  ! Phenergan 2)  ! * Nitro 3)  ! * Lisinopril 4)  ! * Elmiron 5)  ! Septra  Orders Added: 1)  Flu Vaccine 1yrs + MEDICARE PATIENTS [Q2039] 2)  Administration Flu vaccine - MCR [G0008]          Flu Vaccine Consent Questions     Do you have a history of severe allergic reactions to this vaccine? no    Any prior history of allergic reactions to egg and/or gelatin? no    Do you have a sensitivity to the preservative Thimersol? no    Do you have a past history of Guillan-Barre Syndrome? no    Do you currently have an acute febrile illness? no    Have you ever had a severe reaction to latex? no    Vaccine information given and explained to patient? yes    Are you currently pregnant? no    Lot Number:[id number]   Exp Date:01/28/2011   Site Given  Left Deltoid IM1

## 2010-09-01 NOTE — Letter (Signed)
Summary: Letter with Pap Results/[facility]  Letter with Pap Results/[facility]   Imported By: Lanelle Bal 07/02/2010 11:40:42  _____________________________________________________________________  External Attachment:    Type:   Image     Comment:   External Document

## 2010-09-01 NOTE — Letter (Signed)
Summary: [facility]   Imported By: Lanelle Bal 12/08/2009 14:02:13  _____________________________________________________________________  External Attachment:    Type:   Image     Comment:   External Document

## 2010-09-01 NOTE — Letter (Signed)
Summary: [facility]   Imported By: Lanelle Bal 12/08/2009 14:04:12  _____________________________________________________________________  External Attachment:    Type:   Image     Comment:   External Document

## 2010-09-01 NOTE — Letter (Signed)
Summary: [facility]  [facility]   Imported By: Lanelle Bal 11/12/2009 08:46:10  _____________________________________________________________________  External Attachment:    Type:   Image     Comment:   External Document

## 2010-09-01 NOTE — Letter (Signed)
Summary: Patient Questionnaire  Patient Questionnaire   Imported By: Lanelle Bal 08/19/2010 10:14:23  _____________________________________________________________________  External Attachment:    Type:   Image     Comment:   External Document

## 2010-09-01 NOTE — Assessment & Plan Note (Signed)
Summary: discuss TENS/cbs   Vital Signs:  Patient profile:   65 year old female Weight:      170.4 pounds Pulse rate:   60 / minute Resp:     15 per minute BP sitting:   124 / 70  (left arm) Cuff size:   large  Vitals Entered By: Shonna Chock (Dec 03, 2009 11:47 AM) CC: 1.) Discuss TENS  2.) Discuss Skin concerns-look at right foot Comments REVIEWED MED LIST, PATIENT AGREED DOSE AND INSTRUCTION CORRECT    Primary Care Provider:  Marga Melnick, MD  CC:  1.) Discuss TENS  2.) Discuss Skin concerns-look at right foot.  History of Present Illness: The TENS Unit has been effective , in that pain severity is decreased , "? 50 %".Home  TENS was recommended by Physical Therapy after assessment of response in  [facility]. She has seen Dr Ethelene Hal, PT(as noted) & Dr Avie Echevaria. Intermittent back pain to 8 level; but daily "twinges " @ 1 level.  Allergies: 1)  ! Phenergan 2)  ! * Nitro 3)  ! * Lisinopril 4)  ! * Elmiron 5)  ! Septra  Review of Systems GU:  Denies incontinence. Derm:  Complains of rash; Eyelid dermatitis treated by [facility]; separate rash on foot since 03/2009. Neuro:  Denies brief paralysis, numbness, tingling, and weakness.  Physical Exam  General:  well-nourished,in no acute distress; alert,appropriate and cooperative throughout examination Msk:  Sat up w/o help Pulses:  R and L dorsalis pedis and posterior tibial pulses are full and equal bilaterally Extremities:  No clubbing, cyanosis, edema. Good nail health.Negative SLR Neurologic:  strength normal in all extremities and DTRs symmetrical and normal.   Skin:  Mild fungal changes R foot   Impression & Recommendations:  Problem # 1:  LOW BACK PAIN, CHRONIC (ICD-724.2) TENS effective The following medications were removed from the medication list:    Cyclobenzaprine Hcl 5 Mg Tabs (Cyclobenzaprine hcl) .Marland Kitchen... 1 two times a day as needed & 1-2 at bedtime as needed for back Her updated  medication list for this problem includes:    Aspirin Ec 81 Mg Tbec (Aspirin) .Marland Kitchen... Take 1 tablet by mouth every morning    Advil 200 Mg Caps (Ibuprofen) .Marland Kitchen... As needed    Tramadol Hcl 50 Mg Tabs (Tramadol hcl) .Marland Kitchen... 1 by mouth every 6 hours as needed  Problem # 2:  DERMATOPHYTOSIS OF FOOT (ICD-110.4)  Complete Medication List: 1)  Singulair 10 Mg Tabs (Montelukast sodium) .... Take one tablet by mouth daily 2)  Toprol Xl 50 Mg Tb24 (Metoprolol succinate) .... Take one half tablet by mouth daily 3)  Astelin 137 Mcg/spray Soln (Azelastine hcl) .Marland Kitchen.. 1-2 sprays once daily 4)  Aspirin Ec 81 Mg Tbec (Aspirin) .... Take 1 tablet by mouth every morning 5)  Furosemide 40 Mg Tabs (Furosemide) .... 1/2 tablet daily 6)  Advil 200 Mg Caps (Ibuprofen) .... As needed 7)  Calcium 1200 1200-1000 Mg-unit Chew (Calcium carbonate-vit d-min) .Marland Kitchen.. 1 by mouth two times a day 8)  Estrace 0.1 Mg/gm Crea (Estradiol) .Marland Kitchen.. 1 gram twice weekly 9)  Vitamin D 1000 Unit Tabs (Cholecalciferol) .Marland Kitchen.. 1 by mouth once daily 10)  Tramadol Hcl 50 Mg Tabs (Tramadol hcl) .Marland Kitchen.. 1 by mouth every 6 hours as needed 11)  Align Caps (Probiotic product) .Marland Kitchen.. 1 by mouth once daily as needed 12)  Lidoderm 5 % Ptch (Lidocaine) .Marland Kitchen.. 12 hours on, 12 hours off  Patient Instructions: 1)  Use Nizoral  cream two times a day to rash & blow dry with hairdrier

## 2010-09-01 NOTE — Assessment & Plan Note (Signed)
Summary: backpain/kdc   Vital Signs:  Patient profile:   65 year old female Weight:      175.8 pounds Temp:     98.7 degrees F oral Pulse rate:   72 / minute Resp:     16 per minute BP sitting:   120 / 78  (left arm) Cuff size:   large  Vitals Entered By: Shonna Chock (August 16, 2009 2:08 PM) CC: Ongoing Back pain since Thanksgiving. More intense x 1 week Comments REVIEWED MED LIST, PATIENT AGREED DOSE AND INSTRUCTION CORRECT    Primary Care Provider:  Marga Melnick, MD  CC:  Ongoing Back pain since Thanksgiving. More intense x 1 week.  History of Present Illness: She is seeing Amy , a Physical Therapist , @ [facility] [address] [address] for neck pain with benefit. Two back exercises she Rxed  could not be started due to pain. Dr Christell Faith treated her 08/12/2009 w/o benefit, "actually worse". "Crooked " spine with spurs on Xrays @ Chiropracter's office. Chiropractry 2005 helped.  She  presents with throbbing ,constant  back pain since 08/10/2009. Dr Leta Jungling 06/25/2009 note reviewed; back pain improved with steroids by mouth but  intermittently recurrent  since 05/2009.  The patient reports occasional chills and rest pain, but denies fever, weakness, loss of sensation, fecal incontinence, urinary incontinence, urinary retention, and dysuria.  The pain is located in the right low back.  The pain began gradually in 05/2009 w/o injury or trauma.  The pain radiates to the left LS area.  The pain is made worse by exercises Rxed by Dr Christell Faith.  The pain is made better by NSAID medications & Tramadol.  Risk factors for serious underlying conditions include recurrence, duration of pain > 1 month, bedrest with no relief, age >= 50 years, and history of osteopenia.  Allergies: 1)  ! Phenergan 2)  ! * Nitro 3)  ! * Lisinopril 4)  ! * Elmiron 5)  ! Septra  Review of Systems GU:  Denies discharge, dysuria, and hematuria. Derm:  Denies lesion(s) and rash. Neuro:  Denies brief paralysis, numbness, tingling, and  weakness.  Physical Exam  General:  well-nourished,in no acute distress; alert,appropriate and cooperative throughout examination;mildly uncomfortable-appearing.   Abdomen:  Bowel sounds positive,abdomen soft and non-tender without masses, organomegaly or hernias noted. Msk:  Crawls up & down exam table Pulses:  R and L dorsalis pedis and posterior tibial pulses are full and equal bilaterally Extremities:  No clubbing, cyanosis, edema, or deformity noted with normal full range of motion of all joints.  Neg SLR to 90 degrees bilaterally Neurologic:  alert & oriented X3, strength normal in all extremities,heel & toe  gait normal, and DTR decreased @ R knee Skin:  Intact without suspicious lesions or rashes Cervical Nodes:  No lymphadenopathy noted Axillary Nodes:  No palpable lymphadenopathy   Impression & Recommendations:  Problem # 1:  LOW BACK PAIN, CHRONIC (ICD-724.2)  The following medications were removed from the medication list:    Flexeril 10 Mg Tabs (Cyclobenzaprine hcl) .Marland Kitchen... 1 by mouth two times a day as needed pain(never started)    Vicodin 5-500 Mg Tabs (Hydrocodone-acetaminophen) .Marland Kitchen... 1 by mouth q 4 hours as needed pain (never started) Her updated medication list for this problem includes:    Aspirin Ec 81 Mg Tbec (Aspirin) .Marland Kitchen... Take 1 tablet by mouth every morning    Advil 200 Mg Caps (Ibuprofen) .Marland Kitchen... As needed    Tramadol Hcl 50 Mg Tabs (Tramadol hcl) .Marland Kitchen... 1 by  mouth every 6 hours as needed    Cyclobenzaprine Hcl 5 Mg Tabs (Cyclobenzaprine hcl) .Marland Kitchen... 1 two times a day as needed & 1-2 at bedtime as needed for back  Orders: Orthopedic Referral (Ortho)  Problem # 2:  OSTEOPENIA (ICD-733.90) PMH of  Complete Medication List: 1)  Singulair 10 Mg Tabs (Montelukast sodium) .... Take one tablet by mouth daily 2)  Toprol Xl 50 Mg Tb24 (Metoprolol succinate) .... Take one half tablet by mouth daily 3)  Astelin 137 Mcg/spray Soln (Azelastine hcl) .Marland Kitchen.. 1-2 sprays once  daily 4)  Aspirin Ec 81 Mg Tbec (Aspirin) .... Take 1 tablet by mouth every morning 5)  Furosemide 40 Mg Tabs (Furosemide) .... 1/2 tablet daily 6)  Advil 200 Mg Caps (Ibuprofen) .... As needed 7)  Womens [employer] (multiple Vitamins-minerals)  .... Take 1 tablet by mouth once a day 8)  Calcium 1200 1200-1000 Mg-unit Chew (Calcium carbonate-vit d-min) .Marland Kitchen.. 1 by mouth two times a day 9)  Estrace 0.1 Mg/gm Crea (Estradiol) .Marland Kitchen.. 1 gram twice weekly 10)  Vitamin D 1000 Unit Tabs (Cholecalciferol) .Marland Kitchen.. 1 by mouth once daily 11)  Tramadol Hcl 50 Mg Tabs (Tramadol hcl) .Marland Kitchen.. 1 by mouth every 6 hours as needed 12)  Biotin 2500mg   .... 1 by mouth once daily 13)  Align Caps (Probiotic product) .Marland Kitchen.. 1 by mouth once daily as needed 14)  Cyclobenzaprine Hcl 5 Mg Tabs (Cyclobenzaprine hcl) .Marland Kitchen.. 1 two times a day as needed & 1-2 at bedtime as needed for back  Other Orders: UA Dipstick w/o Micro (manual) ([address])  Patient Instructions: 1)  Hold back exercises until seen. Prescriptions: TRAMADOL HCL 50 MG TABS (TRAMADOL HCL) 1 by mouth every 6 hours as needed  #60 x 1   Entered and Authorized by:   Marga Melnick MD   Signed by:   Marga Melnick MD on 08/16/2009   Method used:   Faxed to ...       [facility] (retail)       [address]       [place], [place]  [address]       Ph: [phone]       Fax: [phone]   RxID:   [id number] CYCLOBENZAPRINE HCL 5 MG TABS (CYCLOBENZAPRINE HCL) 1 two times a day as needed & 1-2 at bedtime as needed for back  #30 x 0   Entered and Authorized by:   Marga Melnick MD   Signed by:   Marga Melnick MD on 08/16/2009   Method used:   Faxed to ...       [facility] (retail)       [address] [place] [address]       [place], [place]  [address]       Ph: [phone]       Fax: [phone]   RxID:   [id number]   Laboratory Results   Urine Tests    Routine Urinalysis   Color: yellow Appearance: Clear Glucose: negative   (Normal  Range: Negative) Bilirubin: negative   (Normal Range: Negative) Ketone: negative   (Normal Range: Negative) Spec. Gravity: 1.020   (Normal Range: 1.003-1.035) Blood: negative   (Normal Range: Negative) pH: 6.0   (Normal Range: 5.0-8.0) Protein: negative   (Normal Range: Negative) Urobilinogen: 0.2   (Normal Range: 0-1) Nitrite: negative   (Normal Range: Negative) Leukocyte Esterace: negative   (Normal Range: Negative)

## 2010-09-01 NOTE — Letter (Signed)
Summary: [facility]   Imported By: Lanelle Bal 12/08/2009 14:01:30  _____________________________________________________________________  External Attachment:    Type:   Image     Comment:   External Document

## 2010-09-06 ENCOUNTER — Encounter: Payer: Self-pay | Admitting: Internal Medicine

## 2010-09-16 ENCOUNTER — Encounter: Payer: Self-pay | Admitting: Internal Medicine

## 2010-09-21 ENCOUNTER — Ambulatory Visit (INDEPENDENT_AMBULATORY_CARE_PROVIDER_SITE_OTHER): Payer: Medicare Other | Admitting: Internal Medicine

## 2010-09-21 ENCOUNTER — Encounter: Payer: Self-pay | Admitting: Internal Medicine

## 2010-09-21 DIAGNOSIS — H04129 Dry eye syndrome of unspecified lacrimal gland: Secondary | ICD-10-CM

## 2010-09-27 NOTE — Assessment & Plan Note (Signed)
Summary: ? about eye exam/cbs   Vital Signs:  Patient profile:   65 year old female Weight:      174 pounds Temp:     98.7 degrees F oral Pulse rate:   64 / minute Resp:     14 per minute BP sitting:   124 / 80  (left arm) Cuff size:   large  Vitals Entered By: Shonna Chock CMA (September 21, 2010 11:52 AM) CC: Patient was to to f/u with dermatology and eye doctor and then come back to see Dr.Lourine Alberico, per Dr.Kathyleen Radice   Primary Care Provider:  Marga Melnick, MD  CC:  Patient was to to f/u with dermatology and eye doctor and then come back to see Dr.Shaneese Tait and per Dr.Eagle Pitta.  History of Present Illness:    Dr Doristine Section, Optician  @ [facility] Rxed Loteprednol (topical steroid)  &  a regimen  for dry eyes ; this has not been started. 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 Both Rxed Doxycycline but @ doses of 50 mg & 100 mg. No PMH of Rosacea ,but Dr Stefanie Libel, Derm had Rxed multiple agents for "eyelid  dermatitis or eczema".  Current Medications (verified): 1)  Singulair 10 Mg Tabs (Montelukast Sodium) .... Take One Tablet By Mouth Daily 2)  Toprol Xl 50 Mg Tb24 (Metoprolol Succinate) .... Take One Half Tablet By Mouth Daily 3)  Aspirin Ec 81 Mg Tbec (Aspirin) .... Take 1 Tablet By Mouth Every Morning 4)  Furosemide 40 Mg  Tabs (Furosemide) .... 1/2 Tablet Daily 5)  Citracal/vit D 1000units .... 2 By Mouth Once Daily 6)  Vagifem 10 Mcg Tabs (Estradiol) .Marland Kitchen.. 1-2 X Weekly 7)  Align  Caps (Probiotic Product) .Marland Kitchen.. 1 By Mouth Once Daily As Needed 8)  Vitamin E 1000 Unit Caps (Vitamin E) .Marland Kitchen.. 1 By Mouth Once Daily 9)  Vitamin D3 2000 Unit Caps (Cholecalciferol) .Marland Kitchen.. 1 By Mouth Once Daily  Allergies: 1)  ! Phenergan 2)  ! * Nitro 3)  ! * Lisinopril 4)  ! * Elmiron 5)  ! Septra  Review of Systems General:  Denies chills, fever, sweats, and weight loss. Eyes:  Denies  discharge, double vision, eye pain, red eye, and vision loss-both eyes. MS:  Denies joint redness and joint swelling; Frozen L shoulder.  Physical Exam  General:  well-nourished,in no acute distress; alert,appropriate and cooperative throughout examination Eyes:  No corneal or conjunctival inflammation noted. EOMI. Perrla. Minimal nystagmus  with superior gaze. Vision grossly normal. 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 Take one tablet by mouth daily 2)  Toprol Xl 50 Mg Tb24 (Metoprolol succinate) .... Take one half tablet by mouth daily 3)  Aspirin Ec 81 Mg Tbec (Aspirin) .... Take 1 tablet by mouth every morning 4)  Furosemide 40 Mg Tabs (Furosemide) .... 1/2 tablet daily 5)  Citracal/vit D 1000units  .... 2 by mouth once daily 6)  Vagifem 10 Mcg Tabs (Estradiol) .Marland Kitchen.. 1-2 x weekly 7)  Align Caps (Probiotic product) .Marland Kitchen.. 1 by mouth once daily as needed 8)  Vitamin E 1000 Unit Caps (Vitamin e) .Marland Kitchen.. 1 by  mouth once daily 9)  Vitamin D3 2000 Unit Caps (Cholecalciferol) .Marland Kitchen.. 1 by mouth once daily  Patient Instructions: 1)  Try Doxycycline @ dose of 50 mg once daily ; increase to 100 mg once daily if response is partial.  Avoid significant sun exposure.Assess response to Lotemax 0.5%.   Orders Added: 1)  Est. Patient Level III [[address]109]

## 2010-10-06 ENCOUNTER — Encounter: Payer: Self-pay | Admitting: Internal Medicine

## 2010-10-06 ENCOUNTER — Ambulatory Visit (INDEPENDENT_AMBULATORY_CARE_PROVIDER_SITE_OTHER): Payer: Medicare Other | Admitting: Internal Medicine

## 2010-10-06 DIAGNOSIS — J019 Acute sinusitis, unspecified: Secondary | ICD-10-CM | POA: Insufficient documentation

## 2010-10-06 NOTE — Letter (Signed)
Summary: Geralynn Rile  [facility] [place] [facility]   Imported By: Maryln Gottron 09/27/2010 12:31:29  _____________________________________________________________________  External Attachment:    Type:   Image     Comment:   External Document

## 2010-10-06 NOTE — Letter (Signed)
Summary: Eye Exam/[facility]  Eye Exam/[facility]   Imported By: Maryln Gottron 09/27/2010 12:33:27  _____________________________________________________________________  External Attachment:    Type:   Image     Comment:   External Document

## 2010-10-11 NOTE — Assessment & Plan Note (Signed)
Summary: sinus inf since sunday///sph   Vital Signs:  Patient profile:   64 year old female Weight:      176.8 pounds BMI:     31.94 Temp:     98.2 degrees F oral Pulse rate:   64 / minute Resp:     13  per minute BP sitting:   130 / 82  (left arm) Cuff size:   large  Vitals Entered By: Shonna Chock CMA (October 06, 2010 3:12 PM) CC: 1.)  Sinus infection since Sunday  2.) Examine right foot , URI symptoms   Primary Care Provider:  Marga Melnick, MD  CC:  1.)  Sinus infection since Sunday  2.) Examine right foot  and URI symptoms.  History of Present Illness:    Onset 10/02/2010 as rhinitis alternating with congestion; she now  reports purulent nasal discharge and sore throat, but denies productive cough and earache.  The patient denies fever, dyspnea, and wheezing.  The patient also reports frontal  headache,bilateral facial pain and upper teeth  pain.  The patient denies the following risk factors for Strep sinusitis: tender adenopathy.  Rx: Neti pot , Singulair.  Current Medications (verified): 1)  Singulair 10 Mg Tabs (Montelukast Sodium) .... Take One Tablet By Mouth Daily 2)  Toprol Xl 50 Mg Tb24 (Metoprolol Succinate) .... Take One Half Tablet By Mouth Daily 3)  Aspirin Ec 81 Mg Tbec (Aspirin) .... Take 1 Tablet By Mouth Every Morning 4)  Furosemide 40 Mg  Tabs (Furosemide) .... 1/2 Tablet Daily 5)  Citracal/vit D 1000units .... 2 By Mouth Once Daily 6)  Vagifem 10 Mcg Tabs (Estradiol) .Marland Kitchen.. 1-2 X Weekly 7)  Align  Caps (Probiotic Product) .Marland Kitchen.. 1 By Mouth Once Daily As Needed 8)  Vitamin E 1000 Unit Caps (Vitamin E) .Marland Kitchen.. 1 By Mouth Once Daily 9)  Vitamin D3 2000 Unit Caps (Cholecalciferol) .Marland Kitchen.. 1 By Mouth Once Daily 10)  Doxycycline Hyclate 50 Mg Caps (Doxycycline Hyclate) .Marland Kitchen.. 1 By Mouth Once Daily 11)  Lotemax 0.5 % Susp (Loteprednol Etabonate) .Marland Kitchen.. 1 Drop in Each Eye 4 Times Daily 12)  Systane 0.4-0.3 % Soln (Polyethyl Glycol-Propyl Glycol) .Marland Kitchen.. 1 Drop in Both Eyes 4 X  Daily  Allergies: 1)  ! Phenergan 2)  ! * Nitro 3)  ! * Lisinopril 4)  ! * Elmiron 5)  ! Septra  Physical Exam  General:  well-nourished,in no acute distress; alert,appropriate and cooperative throughout examination Ears:  External ear exam shows no significant lesions or deformities.  Otoscopic examination reveals clear canals, tympanic membranes are intact bilaterally without bulging, retraction, inflammation or discharge. Hearing is grossly normal bilaterally. Nose:  External nasal examination shows no deformity or inflammation. Nasal mucosa are faintly erythematous without lesions or exudates. Hyponasal speech Mouth:  Oral mucosa and oropharynx without lesions or exudates.  Teeth in good repair. Lungs:  Normal respiratory effort, chest expands symmetrically. Lungs are clear to auscultation, no crackles or wheezes. Cervical Nodes:  No lymphadenopathy noted Axillary Nodes:  No palpable lymphadenopathy   Impression & Recommendations:  Problem # 1:  SINUSITIS- ACUTE-NOS (ICD-461.9)  Her updated medication list for this problem includes:    Doxycycline Hyclate 50 Mg Caps (Doxycycline hyclate) .Marland Kitchen... 1 by mouth once daily    Amoxicillin 500 Mg Caps (Amoxicillin) .Marland Kitchen... 1 three times a day    Fluticasone Propionate 50 Mcg/act Susp (Fluticasone propionate) .Marland Kitchen... 1 spray two times a day as needed  Orders: Prescription Created Electronically [id number])  Complete Medication List: 1)  Singulair 10 Mg Tabs (Montelukast sodium) .... Take one tablet by mouth daily 2)  Toprol Xl 50 Mg Tb24 (Metoprolol succinate) .... Take one half tablet by mouth daily 3)  Aspirin Ec 81 Mg Tbec (Aspirin) .... Take 1 tablet by mouth every morning 4)  Furosemide 40 Mg Tabs (Furosemide) .... 1/2 tablet daily 5)  Citracal/vit D 1000units  .... 2 by mouth once daily 6)  Vagifem 10 Mcg Tabs (Estradiol) .Marland Kitchen.. 1-2 x weekly 7)  Align Caps (Probiotic product) .Marland Kitchen.. 1 by mouth once daily as needed 8)  Vitamin E 1000 Unit  Caps (Vitamin e) .Marland Kitchen.. 1 by mouth once daily 9)  Vitamin D3 2000 Unit Caps (Cholecalciferol) .Marland Kitchen.. 1 by mouth once daily 10)  Doxycycline Hyclate 50 Mg Caps (Doxycycline hyclate) .Marland Kitchen.. 1 by mouth once daily 11)  Lotemax 0.5 % Susp (Loteprednol etabonate) .Marland Kitchen.. 1 drop in each eye 4 times daily 12)  Systane 0.4-0.3 % Soln (Polyethyl glycol-propyl glycol) .Marland Kitchen.. 1 drop in both eyes 4 x daily 13)  Amoxicillin 500 Mg Caps (Amoxicillin) .Marland Kitchen.. 1 three times a day 14)  Bactroban 2 % Oint (Mupirocin) .... Apply two times a day as needed 15)  Ketoconazole 2 % Crea (Ketoconazole) .... Apply once daily as needed 16)  Fluticasone Propionate 50 Mcg/act Susp (Fluticasone propionate) .Marland Kitchen.. 1 spray two times a day as needed  Patient Instructions: 1)  Drink as much fluid as you can tolerate for the next few days. Prescriptions: FLUTICASONE PROPIONATE 50 MCG/ACT SUSP (FLUTICASONE PROPIONATE) 1 spray two times a day as needed  #1 x 5   Entered and Authorized by:   Marga Melnick MD   Signed by:   Marga Melnick MD on 10/06/2010   Method used:   Electronically to        [facility] (retail)       [address]       [place], [place]  [address]       Ph: [phone]       Fax: [phone]   RxID:   [id number] KETOCONAZOLE 2 % CREA (KETOCONAZOLE) apply once daily as needed  #30 grams x 1   Entered and Authorized by:   Marga Melnick MD   Signed by:   Marga Melnick MD on 10/06/2010   Method used:   Electronically to        [facility] (retail)       [address]       [place], [place]  [address]       Ph: [phone]       Fax: [phone]   RxID:   [id number] BACTROBAN 2 % OINT (MUPIROCIN) apply two times a day as needed  #15 mg x 0   Entered and Authorized by:   Marga Melnick MD   Signed by:   Marga Melnick MD on 10/06/2010   Method used:   Electronically to        [facility] (retail)       [address]       [place], [place]  [address]        Ph: [phone]       Fax: [phone]   RxID:   [id number] AMOXICILLIN 500 MG CAPS (AMOXICILLIN) 1 three times a day  #30 x 0   Entered and Authorized by:   Marga Melnick MD   Signed by:   Marga Melnick MD on 10/06/2010  Method used:   Electronically to        [facility] (retail)       [address]       [place], [place]  [address]       Ph: [phone]       Fax: [phone]   RxID:   [id number]    Orders Added: 1)  Est. Patient Level III [[address]] 2)  Prescription Created Electronically [phone]

## 2010-10-18 NOTE — Letter (Signed)
Summary: [facility]   Imported By: Maryln Gottron 10/13/2010 10:35:37  _____________________________________________________________________  External Attachment:    Type:   Image     Comment:   External Document

## 2010-12-13 NOTE — Assessment & Plan Note (Signed)
[facility]                         GASTROENTEROLOGY OFFICE NOTE   NAME:[name], [name] AVERITT                       MRN:          [id number]  DATE:09/25/2007                            DOB:          1945-10-16    REFERRING PHYSICIAN:  Titus Dubin. Alwyn Ren, MD,FACP,FCCP   CHIEF COMPLAINT:  Dysphagia.   ASSESSMENT:  A 65 year old white woman who has regurgitation and what  sounds like possible dysphagia.  This is a problem that is more  frequent.  She has clearly had regurgitation and reflux problems in the  past.  She is not having a lot of classic heartburn, however.   Other problems include a history of colonoscopy 5 years ago with  diverticulosis and external hemorrhoids.  She had a somewhat fixed colon  on the left side, thought secondary to prior hysterectomy.  She has no  family history of colon cancer and no history of polyps.   She also has urgent defecation due to a lax sphincter at times, i.e.,  the incontinence would be an issue, as she has a sphincter defect.   RECOMMENDATIONS AND PLAN:  1. Schedule upper GI endoscopy and possible esophageal dilation to      evaluate the dysphagia.  2. Reschedule colonoscopy recall for 2014.  Current standard is to      have an every 10 year colonoscopy in a normal risk person, and she      fits that.  She did recently have Hemoccult testing that was      negative and  this could be continued annually, and when the immune      fecal occult blood testing starts, would to that annually.  3. She was told by me, as she was previously by Dr. Corinda Gubler, that a      colorectal surgeon would be the place to start if she wanted to      consider any type of operative repair of her lax anal sphincter.      She would need an anorectal manometry, etc., workup.  It is not      clear where this came from, though.  She was supposed to have  a      rectocele repair when she had a hysterectomy, but she developed      some sort of  femoral nerve injury and they had to delay the      rectocele repair for a year, and perhaps it was related to one of      those surgeries.  It seems possible.   History as above.  See my medical history form for full details.  She  also complains of gas and bloating and irritable bowel problems.  She  has occasional bright red blood on the tissue paper from known  hemorrhoids.  This is not new and it is rare.  Appetite and weight have  both increased.  She recently stopped Boniva due to a lot of aches and  pains, but no GI symptoms.  She had a CBC that was normal on January 21.  Her LFTs have been okay.   PAST MEDICAL  HISTORY:  1. Hypertension.  2. Dyslipidemia.  3. Sinusitis problems.  4. Allergies.  5. Abnormal LFTs thought secondary to medication in the past.      Question related to Elmiron.  This is resolved.  6. Obesity.  7. Prior hysterectomy.  8. Prior rectocele repair.  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  10.Diverticulosis and hemorrhoids on colonoscopy 2004.  11.Osteoporosis.   MEDICATIONS:  Listed and reviewed in the chart.  You can see that for  doses.  These include aspirin, Astelin nasal spray, Singulair; calcium  magnesium and zinc supplement; metoprolol, furosemide.  She is not on a  proton pump inhibitor at this time.   ALLERGIES/SIDE EFFECTS TO MEDICATIONS:  1. PHENERGAN.  2. NITROGLYCERIN.  3. LISINOPRIL.  4. ELMIRON.   FAMILY HISTORY:  Father had diabetes and liver disease.  Otherwise all  review is negative.  No colon cancer.   SOCIAL HISTORY:  She is married.  She lives with her husband.  He is a  retired Midwife in [employer].  She is teaching at [employer].  Occasional wine.  No tobacco or drugs.   REVIEW OF SYSTEMS:  Positive for fatigue.  She has some urinary  incontinence, pedal edema, cough, allergy problems.   PHYSICAL EXAMINATION:  Reveals a pleasant, well developed, overweight to  obese white woman.  She is  173 pounds.  Her pulse is 60 and regular.  Blood pressure 126/78.  Respirations are 18.  EYES:  Anicteric.  ENT:  Normal mouth, posterior pharynx.  NECK:  Supple without mass.  CHEST:  Clear and resonant.  HEART:  S1, S2.  No murmurs or gallops.  ABDOMEN:  Obese, soft and she had some slight intermittent, but not  persistent, right lower quadrant tenderness.  LYMPHATIC:  No neck adenopathy.  SKIN:  Warm, dry.  No acute rash.  PSYCHIATRIC:  She is alert and oriented x3.  LOWER EXTREMITIES:  Free of edema.  No cyanosis or clubbing.   I appreciate the opportunity to care for this patient.  I have reviewed  the electronic medical record from Dr. Frederik Pear office plus our previous  records as well.     Iva Boop, MD,FACG  Electronically Signed    CEG/MedQ  DD: 09/25/2007  DT: 09/26/2007  Job #: [id number]   cc:   Titus Dubin. Alwyn Ren, MD,FACP,FCCP

## 2010-12-13 NOTE — H&P (Signed)
[name], [name]                ACCOUNT NO.:  [id number]   MEDICAL RECORD NO.:  [id number]          PATIENT TYPE:  EMS   LOCATION:  ED                           FACILITY:  [facility]   PHYSICIAN:  Hollice Espy, M.D.DATE OF BIRTH:  1946/04/10   DATE OF ADMISSION:  01/09/2008  DATE OF DISCHARGE:                              HISTORY & PHYSICAL   PRIMARY CARE PHYSICIAN:  Titus Dubin. Alwyn Ren, MD,FACP,FCCP.   CHIEF COMPLAINTS:  Chest pain.   HISTORY OF PRESENT ILLNESS:  The patient is a 65 year old white female  with past medical history of hypertension who presents to the emergency  room after having 1 day episode of chest pain.  She had been previously  well until today when she started noticing some chest pressure and  aching over her left breast radiating to her back.  She also noted that  when she turned, she could feel it go up to her go up to her left  shoulder somewhat.  She became concerned when the symptoms persisted and  she came into the emergency room.  In the emergency room she got some  morphine which made her symptoms resolve.  Because of her age and  history of hypertension, it was felt best she come in for further  evaluation.   REVIEW OF SYSTEMS:  She otherwise denies any headaches, vision changes,  dysphagia, chest pain, palpitations, shortness of breath, wheeze, cough,  abdominal pain, hematuria, dysuria, constipation, diarrhea, focal  extremity numbness, weakness or pain.  Review of systems otherwise  negative.   PAST MEDICAL HISTORY:  Hypertension and seasonal rhinitis.   MEDICATIONS:  She is on:  1. Nasal spray.  2. Singulair 10.  3. Metoprolol 25.  4. Lasix 40.  5. Aspirin 81.   ALLERGIES:  She has allergies to PHENERGAN, NITROGLYCERIN, LISINOPRIL,  BACTRIM and ELMIRON.   SOCIAL HISTORY:  No tobacco, alcohol or drug use.   FAMILY HISTORY:  Noted for mom with CAD in her 14s.   PHYSICAL EXAMINATION:  VITAL SIGNS:  Temperature 98.1, heart rate 54,  blood pressure 115/63, respirations 16, oxygen saturation 98%.  GENERAL:  She is alert and oriented x3, in no apparent distress.  HEENT:  Normocephalic atraumatic.  Mucous membranes are moist.  NECK:  She has no carotid bruits.  HEART:  Regular rate and rhythm.  S1 and S2.  LUNGS:  Clear to auscultation bilaterally.  ABDOMEN:  Soft, nontender, nondistended.  Positive bowel sounds.  EXTREMITIES:  No clubbing, cyanosis or edema.   LABORATORY DATA:  Chest x-ray shows borderline heart size.  No active  disease.  CPK 84.4, MB 5.2, troponin 0.05.  D-dimer has been ordered  which is pending.  Second set of cardiac markers similar.   ASSESSMENT/PLAN:  1. Chest pain, multiple risk factors.  Check EKG.  Check two more sets      of enzymes to rule out and then will defer to primary as far as      other stress test, inpatient versus outpatient.  2. Hypertension.  Continue meds.  Hold beta blocker.  Hollice Espy, M.D.  Electronically Signed     SKK/MEDQ  D:  01/10/2008  T:  01/10/2008  Job:  [id number]   cc:   Titus Dubin. Alwyn Ren, MD,FACP,FCCP  [phone] [place]  [place] [address]

## 2010-12-13 NOTE — Discharge Summary (Signed)
[name], [name]                ACCOUNT NO.:  [id number]   MEDICAL RECORD NO.:  [id number]          PATIENT TYPE:  INP   LOCATION:  1410                         FACILITY:  [name]   PHYSICIAN:  Valerie A. Felicity [name], MDDATE OF BIRTH:  Mar 07, 1946   DATE OF ADMISSION:  01/09/2008  DATE OF DISCHARGE:  01/10/2008                               DISCHARGE SUMMARY   DISCHARGE DIAGNOSES:  1. Chest wall pain.  2. Hypertriglyceridemia.  3. Hypertension.   HISTORY OF PRESENT ILLNESS:  [name] is a very pleasant 65 year old  white female with past medical history of hypertension who presented to  the emergency room on day of admission reporting chest pain.  The  patient reports onset of pain that day described as chest pressure and  aching over left breast and left scapular area.  The patient also  reported pain worse upon movement with radiation to left shoulder with  any movement.  The patient did receive IV morphine during emergency room  stay which did relieve her pain.  Due to patient's age and history of  hypertension the patient was admitted for further evaluation of chest  pain and to rule out cardiac etiology.   PAST MEDICAL HISTORY:  1. Hypertension.  2. Hyperlipidemia.  3. Allergic rhinitis.   COURSE OF HOSPITALIZATION:  1. Chest wall pain.  The patient admitted for further cardiac workup      with risk factors including age and history of hypertension and      hyperlipidemia.  Cardiac point of care markers obtained in the      emergency room were negative.  Initial set of CK-MB and troponin      revealed a total CK of 255; otherwise, negative findings at time of      dictation.  Second set of cardiac enzymes are pending.  However,      there is a very low suspicion for cardiac etiology of pain as this      nurse practitioner is able to easily reproduce pain on exam      specifically in intercostal space concerning for costochondritis.      The patient denies any recent  shortness of breath or dyspnea on      exertion.  The pain she describes is a constant ache in left      anterior chest and in left scapular region only with movement and      deep inspiration.  At this time we will wait for second set of      cardiac enzymes.  However, if these enzymes are negative the      patient will be discharged home with outpatient followup with      primary care physician.  We will also try anti-inflammatory      treatment for suspected costochondritis.  2. Hypertriglyceridemia.  We will defer treatment of this problem to      the patient's primary care physician as lab work obtained in      January 2009 not repeated during this admission.   DISCHARGE MEDICATIONS:  1. Metoprolol 50 mg tab 1/2 tab p.o.  daily.  2. Singulair 10 mg p.o. daily.  3. Lasix 40 mg 1/2 tablet p.o. daily.  4. Aspirin 81 mg p.o. daily.  5. Astelin nasal spray p.o. daily.  6. Celebrex 200 mg p.o. b.i.d. x10 days.   DISPOSITION:  The patient is felt medically stable for discharge home.  Should second set of cardiac enzymes be negative the patient is  scheduled to follow up with her primary care physician, Dr. Marga Melnick, on June 17 at 10:45 a.m. at which time the patient to be  reassessed to determine need for cardiology referral.  The patient to  continue on low dose aspirin daily as prior to this admission.      Cordelia Pen, NP      [name] Rover. Felicity Coyer, MD  Electronically Signed    LE/MEDQ  D:  01/10/2008  T:  01/10/2008  Job:  [id number]   cc:   Titus Dubin. Alwyn Ren, MD,FACP,FCCP  [phone] [place]  [place] [address]

## 2010-12-16 NOTE — Consult Note (Signed)
[name], [name]                ACCOUNT NO.:  [id number]   MEDICAL RECORD NO.:  [id number]          PATIENT TYPE:  INP   LOCATION:  0347                         FACILITY:  [facility]   PHYSICIAN:  Iva Boop, M.D. LHCDATE OF BIRTH:  Jul 20, 1946   DATE OF CONSULTATION:  05/04/2004  DATE OF DISCHARGE:                                   CONSULTATION   REQUESTING PHYSICIAN:  Jamison Neighbor, M.D.   REASON FOR CONSULTATION:  Abnormal LFTs.   HISTORY:  This is a pleasant 65 year old white female who was admitted to  the hospital because of clot retention after a cystoscopy/bladder analysis  for interstitial cystitis.  She was found to have abnormal liver chemistries  this admission.  We are asked to evaluate.   She has a history of hypertension.  She has had a hysterectomy and bladder  suspension.  She has reflux disease.  Some constipation, irritable bowel  syndrome as well as diverticulosis and a fixed colon on colonoscopy in  April, 2004, performed by Dr. Victorino Dike.  Abdominal ultrasound in July,  2004 demonstrated moderate increased hepatic echodensity with slightly  uneven texture and a right renal cyst.  She has had some mildly elevated  liver chemistries throughout the last year or so, at least.  In February,  2005.  She had an ALT of 50 and an AST of 34.  In May, 2005, ALT was 51, AST  34.  Bilirubin and alkaline phosphatase have been normal.  In September,  2004, she had an ALT of 59 and an AST of 38.  Patient does not really have  any biliary colic symptoms or anything like that.  She had been started on  Elmiron within the last month ago for a diagnosis of interstitial cystitis.  Otherwise, she was on hydroxyzine, amitriptyline, aspirin, Toprol XL,  Micardis, Urocit, Astelin, folic acid, Citracal, and Boniva.   She is allergic to LISINOPRIL in that it causes a cough.  She has had  hypotension with NITROGLYCERIN.  She has had excessive sedation with  PHENERGAN.   SOCIAL  HISTORY:  The patient moved here after her husband retired from [employer] within the past year or so.  She has been around the world with her  husband, as he was stationed.  She had been in the [place].  She does  not really drink alcohol.  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   FAMILY HISTORY:  Father died of cirrhosis.  He had diabetes.  The patient  knows that it was non-alcoholic cirrhosis.  There is no other family  history of liver disease.  The patient has no obvious liver disease risk  factors otherwise.   REVIEW OF SYSTEMS:  Her energy level has been poor.  She has had the  bleeding, difficulty urinating, and a lot of pelvic pain and some  arthralgias.  All other systems appear negative at this time other than that  mentioned above.  Appetite is off somewhat.  There is some postprandial  abdominal pain at times but at times more  like reflux symptomatology with  epigastric pain and radiation up into the chest.   PHYSICAL EXAMINATION:  VITAL SIGNS:  Temperature 98.3, blood pressure 91/50,  pulse 60.  GENERAL:  A pleasant middle-aged woman in no acute distress.  HEENT:  Eyes anicteric.  Mouth:  Posterior pharynx is free of lesions.  NECK:  Supple.  No mass or thyromegaly.  LUNGS:  Clear.  HEART:  S1 and S2.  No gallops.  ABDOMEN:  Soft with some minimal lower quadrant tenderness and suprapubic  tenderness.  There is no hepatomegaly or mass.  No splenomegaly.  SKIN:  Without stigmata of chronic liver disease.   LAB DATA:  ALT is 292.  AST 191.  Alkaline phosphatase 181.  Albumin is 2.5.  BUN 5, creatinine 1.  Platelets 234.  Hemoglobin 8.9, hematocrit 26.3, white  count 3.7.  That was today.  Yesterday, alkaline phosphatase 182, ALT 339,  AST 274.   ASSESSMENT:  Transaminitis with mild elevation of alkaline phosphatase.  Most likely due to Elmiron.  I think she has underlying steatosis or  steatohepatitis causing her mild baseline abnormal  transaminases.  The other  possibilities include other medications, which seems unlikely.  Perhaps an  element of shock liver, although her creatinine has remained normal.  She  did have some mild hypotension.   RECOMMENDATIONS/PLAN:  1.  Ultrasound.  2.  Serologic testing with ANA, antismooth muscle antibody, ferritin, iron,      TIBC, coags.  Check an alpha-1 antitrypsin level and antimitochondrial      antibody.  Chronic hepatitis B and C serology as well.  Further plans pending these results.   At this point, unless any of these labs turn up anything, clinical followup  is advised.  She has indicated that she would like to follow up with me in  the office, and I would be happy to do so.  We will follow her while she is  in the hospital.  She will look in to whether or not she can determine  exactly what the cause of her father's death was, although it certainly  sounds like it was due to non-alcoholic fatty liver disease and cirrhosis  secondary to that.   I appreciated the opportunity to care for this patient.      CEG/MEDQ  D:  05/04/2004  T:  05/04/2004  Job:  [id number]   cc:   Jamison Neighbor, M.D.  [address]  [place]  [place] [address]  Fax: [phone]   Titus Dubin. Alwyn Ren, M.D. [facility]

## 2010-12-16 NOTE — H&P (Signed)
[name], [name]                ACCOUNT NO.:  [id number]   MEDICAL RECORD NO.:  [id number]          PATIENT TYPE:  INP   LOCATION:  0347                         FACILITY:  [facility]   PHYSICIAN:  Jamison Neighbor, M.D.  DATE OF BIRTH:  [birth year]   DATE OF ADMISSION:  05/02/2004  DATE OF DISCHARGE:                                HISTORY & PHYSICAL   HISTORY:  This 65 year old female had a recent cystoscopy and  hydrodistention on April 01, 2004. The patient had diagnosis of  interstitial cystitis made at that time. The patient did well and was seen  in the postoperative period approximately 3 weeks out and was told that she  had IC, and she was started on Elmiron, Atarax, and Elavil. The patient  developed a urinary tract infection and was given antibiotic coverage with  Septra. This past weekend, she ended up presenting to the emergency room  because of clot retention. We noted that she had had an episode of  catheterization x1. She called our office and told us she wanted to remove  the catheter and did not call back to tell us that she was not urinating,  and subsequently presented to the emergency room a second time, catheter was  inserted but she was never irrigated. I saw the patient this Monday on  May 02, 2004, and she was in clot retention. I changed her to a 24-French  __________ catheter, irrigated the bladder, but could not get all of the  clots due to the severity of her pain. In addition, we had concern about the  decrease in her blood pressure with systolic pressures of under 100. The  patient is being admitted for IV fluids, transfusions if necessary, clot  evacuation if necessary, and management of her hematuria. Past medical  history is remarkable for the recent diagnosis of interstitial cystitis. She  is also known to have hypertension.   MEDICATIONS:  1.  Hydroxyzine 25 mg at night.  2.  Amitriptyline 25 mg at night.  3.  Aspirin 1 daily that was  discontinued.  4.  Toprol-XL 25 mg 1/2 tablet daily.  5.  Micardis 80/12.5 1/2 tablet daily.  6.  Urised on a p.r.n. basis.  7.  Elmiron 2 b.i.d.  8.  Astelin nasal spray 2 spray each nostril daily.  9.  Folic acid 80 mg daily.  10. Citracal Plus D 1 to 2 tablets daily.  11. Boniva 150 mg once a month.   PAST MEDICAL HISTORY:  Past medical history is otherwise quite unremarkable  as noted above. She does have hypertension.   PAST SURGICAL HISTORY:  Her previous surgery includes the cystoscopy,  previous hysterectomy, bladder suspension, and excision for a biopsy of the  left index finger.   FAMILY HISTORY:  Noncontributory.   SOCIAL HISTORY:  Unremarkable. She does not use tobacco. Drinks very modest  amounts of alcohol.   REVIEW OF SYSTEMS:  Positive for constipation and diarrhea intermittently  because of probable irritable bowel syndrome, and of course, she has  urgency, frequency, and chronic pelvic pain.   PHYSICAL EXAMINATION:  VITAL SIGNS:  Temperature 99.5, pulse 65,  respirations 20, blood pressure 88/43.  GENERAL:  Well-developed, well-nourished female who is highly anxious,  somewhat pale, and clearly nervous about this hematuria.  HEENT:  Normocephalic, atraumatic. Cranial nerves II-XII grossly intact.  NECK:  Supple with no adenopathy or thyromegaly.  LUNGS:  Clear.  HEART:  Regular rate and rhythm with no murmurs, thrills, gallops, rubs, or  heaves.  ABDOMEN:  Soft. The patient has a little bit of tenderness just above the  pubic bone. She is not in retention at this time.  VAGINAL EXAMINATION:  Not performed.  EXTREMITIES:  No clubbing, cyanosis, or edema.  NEUROLOGICAL:  Appears grossly intact.   IMPRESSION:  Clot retention.   PLAN:  Plan is to admit for catheterization and possible clot evacuation in  the OR plus antibiotic therapy to treat her urinary tract infection.      RJE/MEDQ  D:  05/04/2004  T:  05/04/2004  Job:  [id number]

## 2010-12-16 NOTE — Op Note (Signed)
[place]  Patient:    [name], PIEHL                       MRN: [id number] Proc. Date: 01/25/00 Adm. Date:  [id number] Attending:  Milly Jakob Dictator:   Harvie Junior, M.D. CC:         Harvie Junior, M.D.                           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  PROCEDURE:  Excisional biopsy of lesion, left index finger.  SURGEON:  Dr. Luiz Blare.  ANESTHESIA:  MAC with a digital block.  HISTORY OF PRESENT ILLNESS:  This is a 65 year old female with a long history of having developed a lesion over her left index finger.  It was several times broke open in blood and blood fairly significantly, and she was able to control the bleeding, but because of non-disappearance of the lesion she came to be evaluated.  At that time we followed it conservatively for about six weeks.  There was no change in the lesion, and felt that excisional biopsy was most appropriate.  She is brought to the operating room for this procedure.  DESCRIPTION OF PROCEDURE:  The patient was brought to the operating room, and after adequate anesthesia was obtained with a MAC and a digital block, the patient was placed on the operating table.  The left hand was then prepped and draped in the usual sterile fashion.  Following this, the finger was exsanguinated and a tourniquet was applied to the finger.  The lesion was then ellipsed, care being taken to get down to the base of the lesion, and the skin was then undermined on both sides of this, and the skin was closed with two stitches.  The wound was then copiously irrigated and suctioned dry prior to closure, and a sterile compressive dressing was then applied, as well as a Coband dressing, and the patient  was taken to the recovery room where she was noted to be in satisfactory condition.  The lesion was sent to pathology for evaluation.  ESTIMATED BLOOD LOSS:  None. DD:  01/25/00 TD:  01/26/00 Job: [id number]

## 2010-12-16 NOTE — Discharge Summary (Signed)
[name], [name]                ACCOUNT NO.:  [id number]   MEDICAL RECORD NO.:  [id number]          PATIENT TYPE:  INP   LOCATION:  0347                         [facility]ACILITY[facility]  [facility]   PHYSICIAN:  Jamison Neighbor, M.D.  DATE OF BIRTH:  [birth year]   DATE OF ADMISSION:  05/02/2004  DATE OF DISCHARGE:  05/06/2004                                 DISCHARGE SUMMARY   DISCHARGE DIAGNOSES:  1.  Hemorrhagic cystitis.  2.  Interstitial cystitis.  3.  Hypotension.   PRINCIPAL PROCEDURE:  Cystoscopy and clot evacuation.   HISTORY OF PRESENT ILLNESS:  This is a 65 year old female status post  cystoscopy and hydrodistension in September of 2005.  The patient had  diagnosis of interstitial cystitis made at that time.  The patient recently  developed a problem with urinary tract infection and was started on Septra.  She developed clot retention and was admitted to the hospital for further  evaluation.   MEDICATIONS ON ADMISSION:  1.  Hydroxizine.  2.  Amitriptyline.  3.  Aspirin which has been discontinued.  4.  Toprol XL.  5.  Micardis.  6.  Urised.  7.  Elmiron.  8.  __________.  9.  Folic acid.  10. Citracal.  11. Boniva.   Her past medical history, past surgical history, family history, social  history and review of systems are well ___________ in the hospital chart.   The patient was found to have abnormal liver function test thought to  possibly be due to Elmiron, although the etiology of this remains unclear.  GI consultation was sought.  They felt the patient shoulder undergo  ultrasound and serologic testing with ANA, antismooth muscle antibodies,  ferritin, I&T, TIBC and coags along with Alpha I antitrypsin level and  antimitochondrial antibody as well as chronic hepatitis B and chronic C  serology.  They felt that the most likely reason that the patient had  elevation of her transaminase was due to Elmiron and this certainly is a  possibility.  We felt that while the patient  was in the hospital, that her  blood pressure was low probably because of blood loss.  We did check her mag  and it was only 29.3 but once we made the decision that the patient was  truly symptomatic, we felt that she should undergo a transfusion.  The  patient had low blood pressure, fatigue and low hematocrit.  She underwent  transfusion.  She did feel better afterwards.  She was continued on Cipro  for her UTI.  GI consultation was obtained and completed.  The patient did  have cystoscopy and clot evacuation and felt much better after the clots had  been removed.  The patient was ready for discharge on May 06, 2004, and  we felt she should go home on Cipro.  She does not need to be kept on  Elmiron until the GI situation is stable.  We may do installation therapy.  We did allow the patient to resume her Atarax and Elavil.  We thought that  she might want to talk to Dr. Alwyn Ren about  holding her antihypertensives.   The patient will follow up with [place] in two weeks' time and decision about  installation therapy will be made at that time.      RJE/MEDQ  D:  05/23/2004  T:  05/23/2004  Job:  [id number]

## 2010-12-16 NOTE — Op Note (Signed)
[name], [name]                ACCOUNT NO.:  [id number]   MEDICAL RECORD NO.:  [id number]          PATIENT TYPE:  INP   LOCATION:  0347                         FACILITY:  [facility]   PHYSICIAN:  Jamison Neighbor, M.D.  DATE OF BIRTH:  1946-06-09   DATE OF PROCEDURE:  05/04/2004  DATE OF DISCHARGE:                                 OPERATIVE REPORT   PREOPERATIVE DIAGNOSIS:  Clot retention.   POSTOPERATIVE DIAGNOSIS:  Clot retention.   PROCEDURE:  Cystoscopy and clot evacuation.   SURGEON:  Dr. Logan Bores.   ANESTHESIA:  General.   COMPLICATIONS:  None.   DRAINS:  None.   BRIEF HISTORY:  This 65 year old female underwent cystoscopy looking for  enterocele cystitis on April 01, 2004. The patient did quite well in the  early postoperative period and then was started on a combination medication  including Elmiron, Atarax, and Elavil.   This past weekend, the patient presented to the emergency room in extreme  pain. A day or two before that, he had been found to have an abnormal  urinalysis and positive culture and was started on Septra. When the patient  presented to the emergency room this past Saturday, she was not able to  urinate. A Foley catheter was inserted, and she was sent home. She  subsequently ended up presenting to our office because of an inability to  urinate. It should be noted that the patient had taken her catheter out one  time and did not contact us about inability to empty and returned to the  emergency room a second time. When the patient presented to my office to  formally see me this past Monday, we noted that she was in clot retention.  She could not tolerate clot evacuation in the office, even though we changed  her out to a 24-French catheter and were unable to successfully remove a  large number of clot. The patient has been in the hospital. She has had clot  evacuation done on the floor and did not tolerate this, and still has dark  bloody urine with  what appears to be primarily all old clot. We decided to  go ahead and remove the clot here in the operating room to allow her to get  rid of her catheter and hopefully stop her pain. The patient understands the  risks and benefits of the procedure and gave full informed consent.   PROCEDURE:  After successful induction of general anesthesia, the patient  was placed in a dorsal lithotomy position, prepped with Betadine, draped in  the usual sterile fashion. The resectoscope sheath was inserted, clot  evacuation was performed. The patient then underwent a cystoscopic  examination. This showed no bleeding from the bladder. There were no  bleeding sites. There was nothing to require fulguration. It clearly  appeared that this was old blood that had developed during the initial  episodes of hemorrhagic cystitis and had not clear adequately. The  Foley catheter was left out. We discussed with the patient the potential  benefit of getting some blood transfusion. She has agreed to go ahead  and do  this because she has been somewhat hypotensive. She will remain in the  hospital until tomorrow at which time she will likely be discharged.      RJE/MEDQ  D:  05/04/2004  T:  05/04/2004  Job:  [id number]   cc:   Titus Dubin. Alwyn Ren, M.D. [facility]

## 2010-12-16 NOTE — Op Note (Signed)
NAME:  [name], [name]                          ACCOUNT NO.:  [id number]   MEDICAL RECORD NO.:  [id number]                   PATIENT TYPE:  AMB   LOCATION:  [facility]                                 FACILITY:  [facility]   PHYSICIAN:  Jamison Neighbor, M.D.               DATE OF BIRTH:  1946/03/07   DATE OF PROCEDURE:  04/01/2004  DATE OF DISCHARGE:                                 OPERATIVE REPORT   SERVICE:  Urology.   PREOPERATIVE DIAGNOSES:  Interstitial cystitis.   POSTOPERATIVE DIAGNOSES:  Interstitial cystitis.   PROCEDURE:  Cystoscopy, urethral calibration, hydrodistention of the  bladder, bladder biopsy, Marcaine and Pyridium installation, Marcaine and  Kenalog injection.   SURGEON:  Jamison Neighbor, M.D.   ANESTHESIA:  General.   COMPLICATIONS:  None.   DRAINS:  None.   BRIEF HISTORY:  This 65 year old female has had problems with chronic pelvic  pain. She was referred for evaluation for possible interstitial cystitis.  The patient is status post bladder suspension surgery a number of years ago  in [facility] and also had a hysterectomy.  The patient has done nicely from  the standpoint of the stress incontinence but has had ongoing problems with  bladder pain and/or vaginal pain. The patient has longstanding dyspareunia  and because of the __________ of urgency, frequency, pain and dyspareunia,  it was thought that she needed to be evaluated for IC.  The patient  understands the risks and benefits of the procedure and gave full and  informed consent.   DESCRIPTION OF PROCEDURE:  After successful induction of general anesthesia,  the patient was placed in the dorsal lithotomy position, prepped with  Betadine and draped in the usual sterile fashion.  Careful bimanual  examination revealed an unremarkable bladder base, there was no evidence of  any over correction. She did not have any cystocele, rectocele or enterocele  to speak of. The urethra was calibrated and accepted a  53 [place] female  urethral sound with no evidence of stenosis or stricture. The cystoscope was  inserted, the bladder was carefully inspected and was free of any tumor or  stones. Both ureteral orifices were normal in configuration and location.  The patient did have a significant degree of squamous metaplasia but the  bladder was otherwise unremarkable. The bladder was distended at a pressure  of 100 cmH2O for 5 minutes and when the bladder was drained the bladder  capacity was only 600 mL. This compares unfavorably to a normal bladder  capacity of 1150 or an average for interstitial cystitis of 575. There were  glomerulations seen throughout the bladder consistent with this condition.  A biopsy was taken and sent for mast cell analysis. The biopsy site was  cauterized as was some of the squamous metaplasia in the adjacent area. The  bladder was drained, a mixture of Marcaine and Pyridium was left within the  bladder, Marcaine and  Kenalog were injected periurethrally. The patient  tolerated the procedure well and was taken to the recovery room in good  condition.  She was given intraoperative Toradol, Zofran as well as a B&O  suppository. She will be sent home with Lorcet 10, Pyridium plus and  doxycycline and return to [place] in followup. At  that point if she has not had a good response, she will be started on  installation therapy and if she has had a good therapeutic response will  start her on a combination of Elmiron and Atarax along with possible  additional medications.                                               Jamison Neighbor, M.D.    RJE/MEDQ  D:  04/01/2004  T:  04/02/2004  Job:  [id number]

## 2011-01-13 ENCOUNTER — Other Ambulatory Visit: Payer: Self-pay | Admitting: Internal Medicine

## 2011-02-13 ENCOUNTER — Encounter: Payer: Self-pay | Admitting: Internal Medicine

## 2011-02-13 ENCOUNTER — Telehealth: Payer: Self-pay | Admitting: *Deleted

## 2011-02-13 NOTE — Telephone Encounter (Signed)
PA has been initiated, awaiting fax form.

## 2011-02-15 NOTE — Telephone Encounter (Signed)
Form received and pending MD completion.

## 2011-02-16 ENCOUNTER — Telehealth: Payer: Self-pay | Admitting: *Deleted

## 2011-02-16 NOTE — Telephone Encounter (Signed)
Pt notes that this med was given years ago by [facility] for allergies. Pt notes that since she has been out of the med she has been taking Zyrtec. I asked the pt if she would like the pa done now or if she would like to continue Zyrtec. Pt notes that her sister sees an allergist at [facility] and she has the information to become a pt there. Pt thinks maybe she should have her allergy testing done again. Pt notes that she would like to wait until after her physical with [facility]. PA will not be done at this time.

## 2011-02-22 ENCOUNTER — Other Ambulatory Visit: Payer: Self-pay | Admitting: Geriatric Medicine

## 2011-02-22 DIAGNOSIS — Z1231 Encounter for screening mammogram for malignant neoplasm of breast: Secondary | ICD-10-CM

## 2011-03-22 ENCOUNTER — Other Ambulatory Visit: Payer: Self-pay | Admitting: Internal Medicine

## 2011-03-22 ENCOUNTER — Ambulatory Visit
Admission: RE | Admit: 2011-03-22 | Discharge: 2011-03-22 | Disposition: A | Discharge status: Home / Self Care | Payer: Medicare Other | Source: Ambulatory Visit | Attending: Geriatric Medicine | Admitting: Geriatric Medicine

## 2011-03-22 DIAGNOSIS — Z1231 Encounter for screening mammogram for malignant neoplasm of breast: Secondary | ICD-10-CM

## 2011-04-12 ENCOUNTER — Encounter: Payer: Self-pay | Admitting: Internal Medicine

## 2011-04-12 ENCOUNTER — Ambulatory Visit (INDEPENDENT_AMBULATORY_CARE_PROVIDER_SITE_OTHER): Payer: Medicare Other | Admitting: Internal Medicine

## 2011-04-12 VITALS — BP 118/84 | HR 58 | Temp 98.8°F | Wt 177.0 lb

## 2011-04-12 DIAGNOSIS — R109 Unspecified abdominal pain: Secondary | ICD-10-CM

## 2011-04-12 DIAGNOSIS — N390 Urinary tract infection, site not specified: Secondary | ICD-10-CM

## 2011-04-12 LAB — POCT URINALYSIS DIPSTICK
Ketones, UA: NEGATIVE
Nitrite, UA: POSITIVE
Urobilinogen, UA: 0.2
pH, UA: 5

## 2011-04-12 MED ORDER — NITROFURANTOIN MONOHYD MACRO 100 MG PO CAPS: 100.0000 mg | ORAL_CAPSULE | Freq: Two times a day (BID) | ORAL | Status: AC

## 2011-04-12 MED ORDER — TRAMADOL HCL 50 MG PO TABS: 50.0000 mg | ORAL_TABLET | Freq: Four times a day (QID) | ORAL | Status: DC | PRN

## 2011-04-12 NOTE — Patient Instructions (Signed)
Drink as  much water as you  can over the next 2- 3  days. Avoid spicy foods or alcohol.

## 2011-04-12 NOTE — Progress Notes (Signed)
  Subjective:    Patient ID: [name], female    DOB: [birth year], 65 y.o.   MRN: [id number]  HPI DYSURIA: Onset: 9/10    Worsening: yes  Symptoms Urgency: yes  Frequency: yes  Hesitancy: no  Hematuria: no  Flank Pain: yes  Fever:     Nausea/Vomiting: no  Discharge: no IRed Flags  : (Risk Factors for Complicated UTI) Recent Antibiotic Usage (last 30 days): yes, for root canal    More than 3 UTI's last 12 months: no  PMH of   Renal Disease/Calculi: no  Urinary Tract Abnormality: no   Instrumentation/Trauma: yes, Interstitial Cystitis diagnosed by  cystoscopy 2005  Review of Systems     Objective:   Physical Exam   She appears healthy well nourished; she is uncomfortable but in no acute distress  Skin is warm and dry no significant lesions.  She has no lymphadenopathy about the neck or axilla  She has no organomegaly or masses; there is no tenderness to palpation  She is able to lie back and sit up without help. Straight leg raising was normal bilaterally. She can extend her leg past 90.  Strength and deep tendon reflexes are within normal limits          Assessment & Plan:  #1 urinary tract infection with a small amount of blood and leukocytes; culture pending  Plan: See orders

## 2011-04-13 ENCOUNTER — Ambulatory Visit: Payer: Medicare Other | Admitting: Internal Medicine

## 2011-04-14 LAB — URINE CULTURE

## 2011-04-27 LAB — POCT I-STAT, CHEM 8
Creatinine, Ser: 1.2
Glucose, Bld: 96
Hemoglobin: 12.9
Sodium: 141
TCO2: 23

## 2011-04-27 LAB — POCT CARDIAC MARKERS
CKMB, poc: 3.3
Myoglobin, poc: 98.1
Troponin i, poc: <0.05

## 2011-04-27 LAB — TROPONIN I: Troponin I: <0.01

## 2011-04-27 LAB — CK TOTAL AND CKMB (NOT AT ARMC): Total CK: 255 — ABNORMAL HIGH

## 2011-04-27 LAB — D-DIMER, QUANTITATIVE: D-Dimer, Quant: 0.41

## 2011-05-09 ENCOUNTER — Ambulatory Visit (INDEPENDENT_AMBULATORY_CARE_PROVIDER_SITE_OTHER): Payer: Medicare Other

## 2011-05-09 DIAGNOSIS — Z23 Encounter for immunization: Secondary | ICD-10-CM

## 2011-07-11 ENCOUNTER — Ambulatory Visit (INDEPENDENT_AMBULATORY_CARE_PROVIDER_SITE_OTHER): Payer: Medicare Other | Admitting: Internal Medicine

## 2011-07-11 ENCOUNTER — Encounter: Payer: Self-pay | Admitting: Internal Medicine

## 2011-07-11 VITALS — BP 124/66 | HR 64 | Temp 98.4°F | Resp 14 | Ht 62.75 in | Wt 172.0 lb

## 2011-07-11 DIAGNOSIS — I10 Essential (primary) hypertension: Secondary | ICD-10-CM

## 2011-07-11 DIAGNOSIS — M899 Disorder of bone, unspecified: Secondary | ICD-10-CM

## 2011-07-11 DIAGNOSIS — M2550 Pain in unspecified joint: Secondary | ICD-10-CM

## 2011-07-11 DIAGNOSIS — E559 Vitamin D deficiency, unspecified: Secondary | ICD-10-CM

## 2011-07-11 DIAGNOSIS — Z0000 Encounter for general adult medical examination without abnormal findings: Secondary | ICD-10-CM

## 2011-07-11 DIAGNOSIS — Z86010 Personal history of colonic polyps: Secondary | ICD-10-CM

## 2011-07-11 DIAGNOSIS — R109 Unspecified abdominal pain: Secondary | ICD-10-CM

## 2011-07-11 DIAGNOSIS — M949 Disorder of cartilage, unspecified: Secondary | ICD-10-CM

## 2011-07-11 DIAGNOSIS — B369 Superficial mycosis, unspecified: Secondary | ICD-10-CM

## 2011-07-11 DIAGNOSIS — E782 Mixed hyperlipidemia: Secondary | ICD-10-CM

## 2011-07-11 LAB — CBC WITH DIFFERENTIAL/PLATELET
Basophils Relative: 0.9 % (ref 0.0–3.0)
Eosinophils Absolute: 0.2 10*3/uL (ref 0.0–0.7)
Eosinophils Relative: 3.5 % (ref 0.0–5.0)
Hemoglobin: 13 g/dL (ref 12.0–15.0)
Lymphocytes Relative: 31.4 % (ref 12.0–46.0)
MCHC: 34.5 g/dL (ref 30.0–36.0)
MCV: 87.6 fl (ref 78.0–100.0)
Monocytes Absolute: 0.4 10*3/uL (ref 0.1–1.0)
Neutro Abs: 2.8 10*3/uL (ref 1.4–7.7)
RBC: 4.32 Mil/uL (ref 3.87–5.11)

## 2011-07-11 LAB — BASIC METABOLIC PANEL
CO2: 28 mEq/L (ref 19–32)
Chloride: 104 mEq/L (ref 96–112)
Sodium: 140 mEq/L (ref 135–145)

## 2011-07-11 LAB — HEPATIC FUNCTION PANEL
AST: 36 U/L (ref 0–37)
Total Bilirubin: 0.7 mg/dL (ref 0.3–1.2)

## 2011-07-11 LAB — LIPID PANEL
HDL: 50.9 mg/dL (ref >39.00)
Total CHOL/HDL Ratio: 4

## 2011-07-11 LAB — TSH: TSH: 4.62 u[IU]/mL (ref 0.35–5.50)

## 2011-07-11 NOTE — Patient Instructions (Signed)
Preventive Health Care: Exercise  30-45  minutes a day, 3-4 days a week. Walking is especially valuable in preventing Osteoporosis. Eat a low-fat diet with lots of fruits and vegetables, up to 7-9 servings per day. Consume less than 30 grams of sugar per day from foods & drinks with High Fructose Corn Syrup as #1,2,3 or #4 on label. Eye Doctor - have an eye exam @ least annually

## 2011-07-11 NOTE — Progress Notes (Signed)
Subjective:    Patient ID: [name], female    DOB: 1946/01/24, 65 y.o.   MRN: [id number]  HPI Medicare Wellness Visit:  The following psychosocial & medical history were reviewed as required by Medicare.   Social history: caffeine: 4 cups tea intermittently , alcohol:  Rare wine ,  tobacco use : never  & exercise : not yet.   Home & personal  safety / fall risk: no issues, activities of daily living: no limitations , seatbelt use : yes , and smoke alarm employment : yes .  Power of Attorney/Living Will status : in place  Vision ( as recorded per Nurse) & Hearing  evaluation :  Ophth exam due 2/13; Restasis for dry eyes.Wall chart read & whisper heard @ 6 ft Orientation :oriented X 3 , memory & recall :good, spelling  testing: good ,and mood & affect : normal . Depression / anxiety: occasionally anxious Travel history : [place] , immunization status :Shingles needed , transfusion history:  2005 post bleeding from UTI post cystoscopy for interstitial cystitis (Dr Logan Bores) , and preventive health surveillance ( colonoscopies, BMD , etc as per protocol/ SOC):colonoscopy due ? (adenomatouspolyp 2009) , Dental care:  Seen every 6 mos . Chart reviewed &  Updated. Active issues reviewed & addressed.       Review of Systems HYPERTENSION: Disease Monitoring: Blood pressure range-120-137/?  Chest pain, palpitations- occasional minor palpitations      Dyspnea- some DOE, not exercising Medications: Compliance- yes  Lightheadedness,Syncope- occasional lightheadedness    Edema- no  FASTING HYPERGLYCEMIA, PMH of: Disease Monitoring: Blood Sugar ranges-not monitored  Polyuria/phagia/dipsia- no       Visual problems- no other than above Diet: no plan  HYPERLIPIDEMIA: Disease Monitoring: See symptoms for Hypertension Medications: Compliance- no meds     ROS:Abd pain, bowel changes- daily cramping lower  abd pain for years; Gyn has evaluated this. Dx: "bladder or colon stuff" . Last  colonoscopy 2009.  Muscle aches- no.  She is followed by rheumatologist, Dr. Dow Adolph. There's been no definite  diagnosis made. There is some question of possible chest x-ray change. Records will be requested from Dr. Nickola Major. Labs were performed at that office several months ago; apparently there was a transitory rise in liver function tests. She is unaware of exact details.  She has intermittent numbness of the right hand and a C6/7 distribution after sleeping.     Objective:   Physical Exam Gen.: Healthy and well-nourished in appearance. Alert, appropriate and cooperative throughout exam. Head: Normocephalic without obvious abnormalities  Eyes: No corneal or conjunctival inflammation noted. Pupils equal round reactive to light and accommodation.  Extraocular motion intact. Minor vertical nystagmus Ears: External  ear exam reveals no significant lesions or deformities. Canals clear .TMs normal. Nose: External nasal exam reveals no deformity or inflammation. Nasal mucosa are pink and moist. No lesions or exudates noted. Mouth: Oral mucosa and oropharynx reveal no lesions or exudates. Teeth in good repair. Neck: No deformities, masses, or tenderness noted. Range of motion &  Thyroid  normal. Lungs: Normal respiratory effort; chest expands symmetrically. Lungs are clear to auscultation without rales, wheezes, or increased work of breathing. Heart: Slow rate ; regular rhythm. Normal S1 and S2. No gallop, click, or rub. S 4 w/o murmur. Abdomen: Bowel sounds normal; abdomen soft and nontender. No masses, organomegaly or hernias noted. Genitalia: Julio Sicks NP/ Dr Arelia Sneddon   .  Musculoskeletal/extremities: Minor lordosis noted of  the thoracic  spine. No clubbing, cyanosis, edema, or deformity noted. Range of motion  normal .Tone & strength  normal.Joints normal. Nail health  ; fungal changes toenails Vascular: Carotid,  radial artery, dorsalis pedis and  posterior tibial pulses are full and equal. No bruits present. Neurologic: Alert and oriented x3. Deep tendon reflexes symmetrical and normal.          Skin: Intact without suspicious lesions or rashes. Lymph: No cervical, axillary lymphadenopathy present. Psych: Mood and affect are normal. Normally interactive                                                                                    Assessment & Plan:  #1 Medicare Wellness Exam; criteria met ; data entered #2 Problem List reviewed ; Assessment/ Recommendations made  She describes chronic lower abdominal pain for years; this may be related to interstitial cystitis or less likely irritable bowel. Urologic followup should be considered.  An adenomatous polyp was found in 2009; there is a family history of colon cancer. Follow up with gastroenterologist is recommended because of the type of polyp and family history.  There is a vague history of chest x-ray abnormality; this should be clarified with the rheumatologist.  Bone density should be monitored every 25 months.  C6/7 probable cervical radiculopathy related to sleeping. A memory foam cervical pillow would be recommended. If symptoms persist or progress nerve conduction/EMG could be performed.   Plan: see Orders

## 2011-07-12 ENCOUNTER — Encounter: Payer: Self-pay | Admitting: Gastroenterology

## 2011-07-12 LAB — VITAMIN D 25 HYDROXY (VIT D DEFICIENCY, FRACTURES): Vit D, 25-Hydroxy: 44 ng/mL (ref 30–89)

## 2011-07-13 NOTE — Assessment & Plan Note (Signed)
EKG revealed no significant hypertensive changes or ischemia. Computer EKG could not populated into Epic. EKG to be scanned scan into Epic

## 2011-07-14 ENCOUNTER — Other Ambulatory Visit: Payer: Self-pay | Admitting: Internal Medicine

## 2011-07-14 ENCOUNTER — Telehealth: Payer: Self-pay | Admitting: Internal Medicine

## 2011-07-14 DIAGNOSIS — M8580 Other specified disorders of bone density and structure, unspecified site: Secondary | ICD-10-CM

## 2011-07-14 NOTE — Telephone Encounter (Signed)
Patient calling, asks if her Bone Density has been scheduled.  There is no order entered, but she is due.  Her last was 04/2009 at [facility].  Will you please enter an Order, thank you.

## 2011-07-19 ENCOUNTER — Other Ambulatory Visit: Payer: Self-pay | Admitting: Internal Medicine

## 2011-07-19 DIAGNOSIS — M8580 Other specified disorders of bone density and structure, unspecified site: Secondary | ICD-10-CM

## 2011-07-20 NOTE — Telephone Encounter (Signed)
PATIENT APPT FOR DEXA IS 08-03-2011, PT IS AWARE.

## 2011-07-27 ENCOUNTER — Encounter: Payer: Self-pay | Admitting: Internal Medicine

## 2011-08-02 DIAGNOSIS — M7500 Adhesive capsulitis of unspecified shoulder: Secondary | ICD-10-CM | POA: Diagnosis not present

## 2011-08-02 DIAGNOSIS — M25519 Pain in unspecified shoulder: Secondary | ICD-10-CM | POA: Diagnosis not present

## 2011-08-03 ENCOUNTER — Encounter: Payer: Self-pay | Admitting: Internal Medicine

## 2011-08-03 ENCOUNTER — Ambulatory Visit
Admission: RE | Admit: 2011-08-03 | Discharge: 2011-08-03 | Disposition: A | Discharge status: Home / Self Care | Payer: Medicare Other | Source: Ambulatory Visit | Attending: Internal Medicine | Admitting: Internal Medicine

## 2011-08-03 ENCOUNTER — Ambulatory Visit (INDEPENDENT_AMBULATORY_CARE_PROVIDER_SITE_OTHER): Payer: Medicare Other | Admitting: Internal Medicine

## 2011-08-03 DIAGNOSIS — J209 Acute bronchitis, unspecified: Secondary | ICD-10-CM

## 2011-08-03 DIAGNOSIS — E782 Mixed hyperlipidemia: Secondary | ICD-10-CM

## 2011-08-03 DIAGNOSIS — J069 Acute upper respiratory infection, unspecified: Secondary | ICD-10-CM

## 2011-08-03 DIAGNOSIS — M899 Disorder of bone, unspecified: Secondary | ICD-10-CM | POA: Diagnosis not present

## 2011-08-03 DIAGNOSIS — R110 Nausea: Secondary | ICD-10-CM

## 2011-08-03 DIAGNOSIS — M8580 Other specified disorders of bone density and structure, unspecified site: Secondary | ICD-10-CM

## 2011-08-03 DIAGNOSIS — M949 Disorder of cartilage, unspecified: Secondary | ICD-10-CM | POA: Diagnosis not present

## 2011-08-03 DIAGNOSIS — Z780 Asymptomatic menopausal state: Secondary | ICD-10-CM | POA: Diagnosis not present

## 2011-08-03 MED ORDER — FLUTICASONE PROPIONATE 50 MCG/ACT NA SUSP: 1.0000 | Freq: Two times a day (BID) | NASAL | Status: DC | PRN

## 2011-08-03 MED ORDER — HYDROCODONE-HOMATROPINE 5-1.5 MG/5ML PO SYRP: 5.0000 mL | ORAL_SOLUTION | Freq: Four times a day (QID) | ORAL | Status: AC | PRN

## 2011-08-03 MED ORDER — TRIMETHOBENZAMIDE HCL 300 MG PO CAPS: 300.0000 mg | ORAL_CAPSULE | Freq: Three times a day (TID) | ORAL | Status: AC

## 2011-08-03 MED ORDER — DOXYCYCLINE HYCLATE 100 MG PO CAPS: 100.0000 mg | ORAL_CAPSULE | Freq: Two times a day (BID) | ORAL | Status: AC

## 2011-08-03 NOTE — Patient Instructions (Signed)
Stay on clear liquids for 48-72 hours or until nausea gone.This would include  jello, sherbert (NOT ice cream), Lipton's chicken noodle soup(NOT cream based soups),Gatorade Lite, flat Ginger ale (without High Fructose Corn Syrup),dry toast or crackers, baked potato.No milk , dairy or grease until nausea gone..Flonase 1 spray in each nostril twice a day as needed. Use the "crossover" technique as discussed

## 2011-08-03 NOTE — Progress Notes (Signed)
  Subjective:    Patient ID: [name], female    DOB: 1945/12/30, 66 y.o.   MRN: [id number]  HPI Respiratory tract infection Onset/symptoms:12/30 as progressive cough Exposures (illness/environmental/extrinsic):daughter ill Progression of symptoms:to head congestion with clear secretions Treatments/response:Neti pot Present symptoms: Fever/chills/sweats:chills, low grade fever @ night Frontal headache:yes Facial pain:yes  Sore throat:intermittent Dental pain:upper teeth Lymphadenopathy:no Wheezing/shortness of breath:some wheezing Cough/sputum/hemoptysis:NP            Review of Systems nausea with  present illness without vomiting     Objective:   Physical Exam General appearance:good health ;well nourished; no acute distress or increased work of breathing is present.  No  lymphadenopathy about the head, neck, or axilla noted.   Eyes: No conjunctival inflammation or lid edema is present. Ears:  External ear exam shows no significant lesions or deformities.  Otoscopic examination reveals clear canals, tympanic membranes are intact bilaterally without bulging, retraction, inflammation or discharge.  Nose:  External nasal examination shows no deformity or inflammation. Nasal mucosa are slightly edematous  without lesions or exudates. No septal dislocation or deviation.No obstruction to airflow.   Oral exam: Dental hygiene is good; lips and gums are healthy appearing.There is no oropharyngeal erythema or exudate noted.    Heart:  Normal rate and regular rhythm. S1 and S2 normal without gallop, murmur, click, rub or other extra sounds.   Lungs:Chest clear to auscultation; no wheezes, rhonchi,rales ,or rubs present.No increased work of breathing. Paroxysmal harsh coughing, nonproductive  Extremities:  No cyanosis, edema, or clubbing  noted    Skin: Warm & dry           Assessment & Plan:  #1 bronchitis, acute  #2 upper respiratory tract symptoms without  definitive rhinosinusitis  Plan: See recommendations and orders

## 2011-08-08 ENCOUNTER — Ambulatory Visit: Payer: Medicare Other | Admitting: Gastroenterology

## 2011-08-14 DIAGNOSIS — M25519 Pain in unspecified shoulder: Secondary | ICD-10-CM | POA: Diagnosis not present

## 2011-08-14 DIAGNOSIS — M7500 Adhesive capsulitis of unspecified shoulder: Secondary | ICD-10-CM | POA: Diagnosis not present

## 2011-08-17 DIAGNOSIS — M25519 Pain in unspecified shoulder: Secondary | ICD-10-CM | POA: Diagnosis not present

## 2011-08-17 DIAGNOSIS — M7500 Adhesive capsulitis of unspecified shoulder: Secondary | ICD-10-CM | POA: Diagnosis not present

## 2011-08-21 ENCOUNTER — Ambulatory Visit (INDEPENDENT_AMBULATORY_CARE_PROVIDER_SITE_OTHER): Payer: Medicare Other | Admitting: Gastroenterology

## 2011-08-21 ENCOUNTER — Encounter: Payer: Self-pay | Admitting: Gastroenterology

## 2011-08-21 DIAGNOSIS — Z800 Family history of malignant neoplasm of digestive organs: Secondary | ICD-10-CM

## 2011-08-21 DIAGNOSIS — R109 Unspecified abdominal pain: Secondary | ICD-10-CM | POA: Diagnosis not present

## 2011-08-21 NOTE — Progress Notes (Signed)
Review of pertinent gastrointestinal problems: 1. Sister had colon cancer 2. adenomatous colon polyps:  Colonoscopy 2004 was normal. Colonoscopy October 2009 found 3 polyps, only one of which was adenomatous. Recall colonoscopy at 5 year interval   HPI: This is a   very pleasant 66 year old woman whom I last saw over 3 years ago.  Whom I did colonoscopy 3 years ago.  Dr. Alwyn Ren told her to come here for lower abd cramping for about 2 years.  The cramping is low in the abd, intermittent.  Can last 2 days, can be mild at times.  Bilateral, across lower abd.  She does have interstical cystitis. Has been on align for constipation.  But for the past 2 weeks has been pretty loose.  Has not recently tried fiber supplements.  Does have to strain to move her bowels usually.  Usually gets annual stool cards but not this year.  She had hysterectomy in 1990s.  Vaginal.  Complicated by rectal bleeding.  Has very little anal tone.    Review of systems: Pertinent positive and negative review of systems were noted in the above HPI section. Complete review of systems was performed and was otherwise normal.    Past Medical History  Diagnosis Date  . Hypertension   . Allergy     seasonal  . GERD (gastroesophageal reflux disease)   . Hyperlipidemia     NMR 2005  . Osteopenia     [facility]  . Hx of colonic polyps   . Diverticulosis of colon   . Interstitial cystitis   . Vitamin D deficiency   . Xerostomia     and Xeroophthalmia    Past Surgical History  Procedure Date  . Total abdominal hysterectomy w/ bilateral salpingoophorectomy     dysfunctional menses  . Bladder surgery     for incontinence  . G 1 p 1   . Colonscopy     tics 10-2002  . Interstitial cystitis     with clot in catheter  . Elevated lfts     due to Elmiron  . Colonoscopy w/ polypectomy     adenomatous poyp 04-2008    Current Outpatient Prescriptions  Medication Sig Dispense Refill  . aspirin  81 MG tablet Take 81 mg by mouth daily.        . bifidobacterium infantis (ALIGN) capsule Take 1 capsule by mouth daily.        . Calcium Citrate-Vitamin D (CITRACAL + D PO) Take by mouth daily.        . cetirizine (ZYRTEC) 10 MG tablet Take 10 mg by mouth daily.        . Cholecalciferol (VITAMIN D3) 2000 UNITS TABS Take 2,000 Int'l Units by mouth daily.        . cycloSPORINE (RESTASIS) 0.05 % ophthalmic emulsion Place 1 drop into both eyes 2 (two) times daily.        . Estradiol (VAGIFEM) 10 MCG TABS Place vaginally. 2 x weekly       . fluticasone (FLONASE) 50 MCG/ACT nasal spray Place 1 spray into the nose 2 (two) times daily as needed for rhinitis.  16 g  2  . furosemide (LASIX) 40 MG tablet Take 40 mg by mouth daily. 1/2 tablet daily       . ibuprofen (ADVIL,MOTRIN) 800 MG tablet Take 800 mg by mouth. Up to three times daily       . ketoconazole (NIZORAL) 2 % cream Apply 1 application  topically daily.        . metoprolol (TOPROL-XL) 50 MG 24 hr tablet TAKE HALF TABLET BY MOUTH DAILY  45 tablet  1  . vitamin E 1000 UNIT capsule Take 1,000 Units by mouth daily.        . traMADol (ULTRAM) 50 MG tablet Take 1 tablet (50 mg total) by mouth every 6 (six) hours as needed for pain.  30 tablet  0    Allergies as of 08/21/2011 - Review Complete 08/21/2011  Allergen Reaction Noted  . Nitroglycerin Anaphylaxis 04/12/2011  . Elmiron  04/12/2011  . Pentosan polysulfate sodium    . Promethazine hcl    . Lisinopril    . Sulfamethoxazole w/trimethoprim      Family History  Problem Relation Age of Onset  . Coronary artery disease Mother   . Hypertension Mother   . Asthma Mother   . Osteoporosis Mother   . Dementia Mother   . Stroke Mother     mini cva  . Diabetes Father   . Cancer Sister     colon  . Cancer Paternal Aunt     2 aunts had renal cancer  . Coronary artery disease Maternal Grandfather   . Cirrhosis Father     non alcoholic  . Cancer Maternal Aunt     breast  . Diabetes  Paternal Aunt   . Diabetes Paternal Grandmother     History   Social History  . Marital Status: Married    Spouse Name: N/A    Number of Children: 1  . Years of Education: N/A   Occupational History  . retired    Social History Main Topics  . Smoking status: Never Smoker   . Smokeless tobacco: Not on file  . Alcohol Use: No  . Drug Use: No  . Sexually Active:    Other Topics Concern  . Not on file   Social History Narrative   Married       Physical Exam: BP 142/70  Pulse 72  Ht 5\' 2"  (1.575 m)  Wt 172 lb (78.019 kg)  BMI 31.46 kg/m2 Constitutional: generally well-appearing Psychiatric: alert and oriented x3 Eyes: extraocular movements intact Mouth: oral pharynx moist, no lesions Neck: supple no lymphadenopathy Cardiovascular: heart regular rate and rhythm Lungs: clear to auscultation bilaterally Abdomen: soft, nontender, nondistended, no obvious ascites, no peritoneal signs, normal bowel sounds Extremities: no lower extremity edema bilaterally Skin: no lesions on visible extremities    Assessment and plan: 66 y.o. female with  family history of colon cancer, personal history of colon polyps, lower abdomen cramping and irregular bowel habits  Her symptoms seem to be cramping from IBS-like trouble. She does have to strain to move her bowels. I recommended she try fiber supplements on a daily basis and call our office in 4 weeks to report on her symptoms. I do not think her symptoms are significant enough currently to warrant repeat colonoscopy which was last done about 3-1/2 years ago. She does not need further Hemoccult testing for screening purposes since she is on colonoscopy surveillance regimen.

## 2011-08-21 NOTE — Patient Instructions (Signed)
Please start taking citrucel (orange flavored) powder fiber supplement.  This may cause some bloating at first but that usually goes away. Begin with a small spoonful and work your way up to a large, heaping spoonful daily over a week. Call Dr. Christella Hartigan office in 4 weeks to report on your symptoms. You routine recall colonoscopy is to be 04/2013.

## 2011-08-22 ENCOUNTER — Encounter: Payer: Self-pay | Admitting: Internal Medicine

## 2011-08-22 DIAGNOSIS — M7500 Adhesive capsulitis of unspecified shoulder: Secondary | ICD-10-CM | POA: Diagnosis not present

## 2011-08-22 DIAGNOSIS — M25519 Pain in unspecified shoulder: Secondary | ICD-10-CM | POA: Diagnosis not present

## 2011-08-24 DIAGNOSIS — M25519 Pain in unspecified shoulder: Secondary | ICD-10-CM | POA: Diagnosis not present

## 2011-08-24 DIAGNOSIS — M7500 Adhesive capsulitis of unspecified shoulder: Secondary | ICD-10-CM | POA: Diagnosis not present

## 2011-08-28 DIAGNOSIS — M549 Dorsalgia, unspecified: Secondary | ICD-10-CM | POA: Diagnosis not present

## 2011-08-28 DIAGNOSIS — M2550 Pain in unspecified joint: Secondary | ICD-10-CM | POA: Diagnosis not present

## 2011-08-28 DIAGNOSIS — Z1589 Genetic susceptibility to other disease: Secondary | ICD-10-CM | POA: Diagnosis not present

## 2011-09-07 DIAGNOSIS — M7500 Adhesive capsulitis of unspecified shoulder: Secondary | ICD-10-CM | POA: Diagnosis not present

## 2011-09-07 DIAGNOSIS — M25519 Pain in unspecified shoulder: Secondary | ICD-10-CM | POA: Diagnosis not present

## 2011-09-11 ENCOUNTER — Other Ambulatory Visit: Payer: Self-pay | Admitting: Internal Medicine

## 2011-09-12 DIAGNOSIS — M25519 Pain in unspecified shoulder: Secondary | ICD-10-CM | POA: Diagnosis not present

## 2011-09-12 DIAGNOSIS — M7500 Adhesive capsulitis of unspecified shoulder: Secondary | ICD-10-CM | POA: Diagnosis not present

## 2011-09-14 DIAGNOSIS — M25519 Pain in unspecified shoulder: Secondary | ICD-10-CM | POA: Diagnosis not present

## 2011-09-14 DIAGNOSIS — M7500 Adhesive capsulitis of unspecified shoulder: Secondary | ICD-10-CM | POA: Diagnosis not present

## 2011-09-19 DIAGNOSIS — M7500 Adhesive capsulitis of unspecified shoulder: Secondary | ICD-10-CM | POA: Diagnosis not present

## 2011-09-19 DIAGNOSIS — M25519 Pain in unspecified shoulder: Secondary | ICD-10-CM | POA: Diagnosis not present

## 2011-09-21 ENCOUNTER — Other Ambulatory Visit: Payer: Self-pay | Admitting: Internal Medicine

## 2011-09-21 DIAGNOSIS — M25519 Pain in unspecified shoulder: Secondary | ICD-10-CM | POA: Diagnosis not present

## 2011-09-21 DIAGNOSIS — M7500 Adhesive capsulitis of unspecified shoulder: Secondary | ICD-10-CM | POA: Diagnosis not present

## 2011-09-21 NOTE — Telephone Encounter (Signed)
Left message to call office

## 2011-09-21 NOTE — Telephone Encounter (Signed)
Dr.Hopper please advise 

## 2011-09-21 NOTE — Telephone Encounter (Signed)
Options for respiratory  symptoms include the following:   Zicam Melts or Zinc lozenges ; vitamin C 2000 mg daily; & Echinacea for 4-7 days.   If an antibiotic is felt to be indicated; an appointment is necessary.  If antibiotics are prescribed inappropriately there is risk of resistant infection such as Staph and possible health  threatening condition such as colitis.These are the guidelines provided to physicians  by the American Medical Association an the  [facility]. Medical Board

## 2011-09-22 NOTE — Telephone Encounter (Signed)
Pt states that request was sent to wrong physician. Pt notes that she is on this med currently for another condition which require her to continue this med routinely.

## 2011-09-29 DIAGNOSIS — H04129 Dry eye syndrome of unspecified lacrimal gland: Secondary | ICD-10-CM | POA: Diagnosis not present

## 2011-09-29 DIAGNOSIS — H2510 Age-related nuclear cataract, unspecified eye: Secondary | ICD-10-CM | POA: Diagnosis not present

## 2011-10-01 ENCOUNTER — Encounter (HOSPITAL_BASED_OUTPATIENT_CLINIC_OR_DEPARTMENT_OTHER): Payer: Self-pay | Admitting: *Deleted

## 2011-10-01 ENCOUNTER — Emergency Department (INDEPENDENT_AMBULATORY_CARE_PROVIDER_SITE_OTHER): Payer: Medicare Other

## 2011-10-01 ENCOUNTER — Emergency Department (HOSPITAL_BASED_OUTPATIENT_CLINIC_OR_DEPARTMENT_OTHER)
Admission: EM | Admit: 2011-10-01 | Discharge: 2011-10-01 | Disposition: A | Discharge status: Home / Self Care | Payer: Medicare Other | Attending: Emergency Medicine | Admitting: Emergency Medicine

## 2011-10-01 DIAGNOSIS — M25439 Effusion, unspecified wrist: Secondary | ICD-10-CM | POA: Diagnosis not present

## 2011-10-01 DIAGNOSIS — S59919A Unspecified injury of unspecified forearm, initial encounter: Secondary | ICD-10-CM

## 2011-10-01 DIAGNOSIS — K219 Gastro-esophageal reflux disease without esophagitis: Secondary | ICD-10-CM | POA: Insufficient documentation

## 2011-10-01 DIAGNOSIS — M25539 Pain in unspecified wrist: Secondary | ICD-10-CM | POA: Diagnosis not present

## 2011-10-01 DIAGNOSIS — X58XXXA Exposure to other specified factors, initial encounter: Secondary | ICD-10-CM | POA: Diagnosis not present

## 2011-10-01 DIAGNOSIS — Z79899 Other long term (current) drug therapy: Secondary | ICD-10-CM | POA: Insufficient documentation

## 2011-10-01 DIAGNOSIS — S6990XA Unspecified injury of unspecified wrist, hand and finger(s), initial encounter: Secondary | ICD-10-CM

## 2011-10-01 DIAGNOSIS — M129 Arthropathy, unspecified: Secondary | ICD-10-CM | POA: Diagnosis not present

## 2011-10-01 DIAGNOSIS — S63509A Unspecified sprain of unspecified wrist, initial encounter: Secondary | ICD-10-CM | POA: Diagnosis not present

## 2011-10-01 DIAGNOSIS — S66919A Strain of unspecified muscle, fascia and tendon at wrist and hand level, unspecified hand, initial encounter: Secondary | ICD-10-CM

## 2011-10-01 DIAGNOSIS — E785 Hyperlipidemia, unspecified: Secondary | ICD-10-CM | POA: Insufficient documentation

## 2011-10-01 DIAGNOSIS — Z7982 Long term (current) use of aspirin: Secondary | ICD-10-CM | POA: Diagnosis not present

## 2011-10-01 DIAGNOSIS — I10 Essential (primary) hypertension: Secondary | ICD-10-CM | POA: Diagnosis not present

## 2011-10-01 DIAGNOSIS — S59909A Unspecified injury of unspecified elbow, initial encounter: Secondary | ICD-10-CM | POA: Diagnosis not present

## 2011-10-01 DIAGNOSIS — W230XXA Caught, crushed, jammed, or pinched between moving objects, initial encounter: Secondary | ICD-10-CM | POA: Insufficient documentation

## 2011-10-01 HISTORY — DX: Unspecified osteoarthritis, unspecified site: M19.90

## 2011-10-01 MED ORDER — HYDROCODONE-ACETAMINOPHEN 5-500 MG PO TABS: 1.0000 | ORAL_TABLET | Freq: Four times a day (QID) | ORAL | Status: AC | PRN

## 2011-10-01 NOTE — ED Provider Notes (Signed)
History     CSN: [id number]  Arrival date & time 10/01/11  1538   First MD Initiated Contact with Patient 10/01/11 1554      Chief Complaint  Patient presents with  . Wrist Pain    (Consider location/radiation/quality/duration/timing/severity/associated sxs/prior treatment) HPI Comments: Pt states that 2 days ago she got her wrist stuck between the couch and the wall and then yesterday it started to hurt with certain movement:pt denies any previous injury  Patient is a 66 y.o. female presenting with wrist pain. The history is provided by the patient. No language interpreter was used.  Wrist Pain This is a new problem. The current episode started yesterday. The problem occurs constantly. The problem has been unchanged. Pertinent negatives include no fever or joint swelling. The symptoms are aggravated by bending and twisting. She has tried immobilization for the symptoms.    Past Medical History  Diagnosis Date  . Hypertension   . Allergy     seasonal  . GERD (gastroesophageal reflux disease)   . Hyperlipidemia     NMR 2005  . Osteopenia     [facility]  . Hx of colonic polyps   . Diverticulosis of colon   . Interstitial cystitis   . Vitamin d deficiency   . Xerostomia     and Xeroophthalmia  . Arthritis     Past Surgical History  Procedure Date  . Total abdominal hysterectomy w/ bilateral salpingoophorectomy     dysfunctional menses  . Bladder surgery     for incontinence  . G 1 p 1   . Colonscopy     tics 10-2002  . Interstitial cystitis     with clot in catheter  . Elevated lfts     due to Elmiron  . 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 Coronary artery disease Mother   . Hypertension Mother   . Asthma Mother   . Osteoporosis Mother   . Dementia Mother   . Stroke Mother     mini cva  . Diabetes Father   . Cancer Sister     colon  . Cancer Paternal Aunt     2 aunts had renal cancer  . Coronary artery disease Maternal Grandfather   . Cirrhosis Father     non alcoholic  . Cancer Maternal Aunt     breast  . Diabetes Paternal Aunt   . Diabetes Paternal Grandmother     History  Substance Use Topics  . Smoking status: Never Smoker   . Smokeless tobacco: Not on file  . Alcohol Use: No    OB History    Grav Para Term Preterm Abortions TAB SAB Ect Mult Living                  Review of Systems  Constitutional: Negative for fever.  Musculoskeletal: Negative for joint swelling.  All other systems reviewed and are negative.    Allergies  Nitroglycerin; Elmiron; Pentosan polysulfate sodium; Promethazine hcl; Lisinopril; and Sulfamethoxazole w/trimethoprim  Home Medications   Current Outpatient Rx  Name Route Sig Dispense Refill  . ASPIRIN 81 MG PO TABS Oral Take 81 mg by mouth daily.      Marland Kitchen CITRACAL + D PO Oral Take 1 tablet by mouth 2 (two) times daily.     Marland Kitchen CETIRIZINE HCL 10 MG PO TABS Oral Take 10 mg by  mouth daily.      Marland Kitchen VITAMIN D3 2000 UNITS PO TABS Oral Take 2,000 Units by mouth daily.     . CYCLOSPORINE 0.05 % OP EMUL Both Eyes Place 1 drop into both eyes 2 (two) times daily.     Marland Kitchen DOXYCYCLINE MONOHYDRATE 50 MG PO CAPS Oral Take 50 mg by mouth daily.    Marland Kitchen ESTRADIOL 10 MCG VA TABS Vaginal Place 10 mcg vaginally 2 (two) times a week. Take on Monday and Thursday    . FUROSEMIDE 40 MG PO TABS Oral Take 20 mg by mouth daily.    . IBUPROFEN 800 MG PO TABS Oral Take 800 mg by mouth 3 (three) times daily as needed. For pain    . KETOCONAZOLE 2 % EX CREA Topical Apply 1 application topically daily.      Marland Kitchen METOPROLOL SUCCINATE ER 50 MG PO TB24 Oral Take 25 mg by mouth daily. Take with or immediately following a meal.    . VITAMIN E 1000 UNITS PO CAPS Oral Take 1,000 Units by mouth daily.        BP 141/76  Pulse 74  Temp(Src) 98.6 F (37 C) (Oral)  Resp 18  Ht 5\' 2"  (1.575 m)  Wt 170 lb (77.111 kg)  BMI 31.09 kg/m2  SpO2  97%  Physical Exam  Nursing note and vitals reviewed. Constitutional: She is oriented to person, place, and time. She appears well-developed and well-nourished.  HENT:  Head: Normocephalic and atraumatic.  Cardiovascular: Normal rate and regular rhythm.   Musculoskeletal: Normal range of motion.       Pt tender on the right lateral wrist with mild swelling noted:pt has full rom  Neurological: She is alert and oriented to person, place, and time.  Skin: Skin is warm and dry.  Psychiatric: She has a normal mood and affect.    ED Course  Procedures (including critical care time)  Labs Reviewed - No data to display Dg Wrist Complete Right  10/01/2011  *RADIOLOGY REPORT*  Clinical Data: Wrist pain.  Injury.  RIGHT WRIST - COMPLETE 3+ VIEW  Comparison: 11/29/2010  Findings: There are degenerative changes in the wrist, most notable at the first carpometacarpal joint.  Degenerative change is also seen at the interphalangeal joint of the thumb.  No evidence for acute fracture or subluxation.  No radiopaque foreign body or soft tissue gas.  IMPRESSION:  1.  Degenerative changes. 2. No evidence for acute  abnormality.  Original Report Authenticated By: Patterson Hammersmith, M.D.     1. Wrist strain       MDM  Pt splinted here for comfort:pt can treat symptomatically        Teressa Lower, NP 10/01/11 1719

## 2011-10-01 NOTE — ED Notes (Signed)
Pt states she got her right wrist stuck between the wall and the couch and could not get it out until she pulled on it forcefully. Now c/o pain to same. + radial pulse. Moves fingers. Feels touch. Cap refill < 3 sec.

## 2011-10-01 NOTE — Discharge Instructions (Signed)
Joint Sprain A sprain is a tear or stretch in the ligaments that hold a joint together. Severe sprains may need as long as 3-6 weeks of immobilization and/or exercises to heal completely. Sprained joints should be rested and protected. If not, they can become unstable and prone to re-injury. Proper treatment can reduce your pain, shorten the period of disability, and reduce the risk of repeated injuries. TREATMENT   Rest and elevate the injured joint to reduce pain and swelling.   Apply ice packs to the injury for 20-30 minutes every 2-3 hours for the next 2-3 days.   Keep the injury wrapped in a compression bandage or splint as long as the joint is painful or as instructed by your caregiver.   Do not use the injured joint until it is completely healed to prevent re-injury and chronic instability. Follow the instructions of your caregiver.   Long-term sprain management may require exercises and/or treatment by a physical therapist. Taping or special braces may help stabilize the joint until it is completely better.  SEEK MEDICAL CARE IF:   You develop increased pain or swelling of the joint.   You develop increasing redness and warmth of the joint.   You develop a fever.   It becomes stiff.   Your hand or foot gets cold or numb.  Document Released: 08/24/2004 Document Revised: 07/06/2011 Document Reviewed: 08/03/2008 [employer] Patient Information 2012 [employer]. 

## 2011-10-02 DIAGNOSIS — H1045 Other chronic allergic conjunctivitis: Secondary | ICD-10-CM | POA: Diagnosis not present

## 2011-10-02 DIAGNOSIS — H04129 Dry eye syndrome of unspecified lacrimal gland: Secondary | ICD-10-CM | POA: Diagnosis not present

## 2011-10-04 NOTE — ED Provider Notes (Signed)
Evaluation and management procedures were performed by the PA/NP under my supervision/collaboration.   [name] Vanderheiden D Alechia Lezama, MD 10/04/11 2035 

## 2011-10-10 ENCOUNTER — Other Ambulatory Visit: Payer: Self-pay | Admitting: Internal Medicine

## 2011-10-10 NOTE — Telephone Encounter (Signed)
Prescription sent to pharmacy.

## 2011-10-31 DIAGNOSIS — M2550 Pain in unspecified joint: Secondary | ICD-10-CM | POA: Diagnosis not present

## 2011-10-31 DIAGNOSIS — M3500 Sicca syndrome, unspecified: Secondary | ICD-10-CM | POA: Diagnosis not present

## 2011-10-31 DIAGNOSIS — M1990 Unspecified osteoarthritis, unspecified site: Secondary | ICD-10-CM | POA: Diagnosis not present

## 2011-10-31 DIAGNOSIS — Z79899 Other long term (current) drug therapy: Secondary | ICD-10-CM | POA: Diagnosis not present

## 2011-10-31 DIAGNOSIS — Z1589 Genetic susceptibility to other disease: Secondary | ICD-10-CM | POA: Diagnosis not present

## 2011-10-31 DIAGNOSIS — G5600 Carpal tunnel syndrome, unspecified upper limb: Secondary | ICD-10-CM | POA: Diagnosis not present

## 2011-11-01 DIAGNOSIS — N952 Postmenopausal atrophic vaginitis: Secondary | ICD-10-CM | POA: Diagnosis not present

## 2011-11-01 DIAGNOSIS — Z124 Encounter for screening for malignant neoplasm of cervix: Secondary | ICD-10-CM | POA: Diagnosis not present

## 2011-11-01 DIAGNOSIS — G4700 Insomnia, unspecified: Secondary | ICD-10-CM | POA: Diagnosis not present

## 2011-11-01 DIAGNOSIS — Z1212 Encounter for screening for malignant neoplasm of rectum: Secondary | ICD-10-CM | POA: Diagnosis not present

## 2011-11-15 DIAGNOSIS — G5600 Carpal tunnel syndrome, unspecified upper limb: Secondary | ICD-10-CM | POA: Diagnosis not present

## 2011-11-23 DIAGNOSIS — H2510 Age-related nuclear cataract, unspecified eye: Secondary | ICD-10-CM | POA: Diagnosis not present

## 2011-11-24 ENCOUNTER — Institutional Professional Consult (permissible substitution): Payer: Medicare Other | Admitting: Pulmonary Disease

## 2011-12-27 ENCOUNTER — Encounter: Payer: Self-pay | Admitting: Pulmonary Disease

## 2011-12-27 ENCOUNTER — Ambulatory Visit (INDEPENDENT_AMBULATORY_CARE_PROVIDER_SITE_OTHER): Payer: Medicare Other | Admitting: Pulmonary Disease

## 2011-12-27 VITALS — BP 120/78 | HR 49 | Temp 97.6°F | Ht 63.0 in | Wt 175.2 lb

## 2011-12-27 DIAGNOSIS — R0989 Other specified symptoms and signs involving the circulatory and respiratory systems: Secondary | ICD-10-CM

## 2011-12-27 DIAGNOSIS — R0609 Other forms of dyspnea: Secondary | ICD-10-CM | POA: Diagnosis not present

## 2011-12-27 DIAGNOSIS — R0683 Snoring: Secondary | ICD-10-CM

## 2011-12-27 NOTE — Progress Notes (Signed)
Chief Complaint  Patient presents with  . SLEEP CONSULT    Pt refered by Julio Sicks.    History of Present Illness: [name] is a 66 y.o. female for evaluation of sleep problems.  Her husband was recently diagnosed with sleep apnea.  He notices she has similar symptoms.  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  She snores, and her husband says she makes funny noises with her breathing.  She goes to bed at 12.  It can take some time for her to fall asleep.  She sleeps through the night after this.  She wakes up at 9 am.  She feels okay in the morning, but used to get better sleep before.  She is not using anything to help her sleep or stay awake.  She does get episodes of reflux at night.  She gets occasional allergies, and has been told she has a septal deviation of her nose.  She will also get an occasional croupy cough at night.  The patient denies sleep walking, sleep talking, bruxism, or nightmares.  There is no history of restless legs.  The patient denies sleep hallucinations, sleep paralysis, or cataplexy.  Epworth score is 3 out of 24.   Past Medical History  Diagnosis Date  . Hypertension   . Allergy     seasonal  . GERD (gastroesophageal reflux disease)   . Hyperlipidemia     NMR 2005  . Osteopenia     [facility]  . Hx of colonic polyps   . Diverticulosis of colon   . Interstitial cystitis   . Vitamin d deficiency   . Xerostomia     and Xeroophthalmia  . Arthritis     Past Surgical History  Procedure Date  . Total abdominal hysterectomy w/ bilateral salpingoophorectomy     dysfunctional menses  . Bladder surgery     for incontinence  . G 1 p 1   . Colonscopy     tics 10-2002  . Interstitial cystitis     with clot in catheter  . Elevated lfts     due to Elmiron  . 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 aspirin 81 MG tablet Take 81 mg by mouth daily.        . Calcium Citrate-Vitamin D (CITRACAL + D PO) Take 1 tablet by mouth 2 (two) times daily.       . cetirizine (ZYRTEC) 10 MG tablet Take 10 mg by mouth daily.        . Cholecalciferol (VITAMIN D3) 2000 UNITS TABS Take 2,000 Units by mouth daily.       . cycloSPORINE (RESTASIS) 0.05 % ophthalmic emulsion Place 1 drop into both eyes 2 (two) times daily.       Marland Kitchen doxycycline (MONODOX) 50 MG capsule Take 50 mg by mouth daily.      . Estradiol (VAGIFEM) 10 MCG TABS Place 10 mcg vaginally 2 (two) times a week. Take on Monday and Thursday      . furosemide (LASIX) 40 MG tablet Take 20 mg by mouth daily.      Marland Kitchen ibuprofen (ADVIL,MOTRIN) 800 MG tablet Take 800 mg by mouth 3 (three) times daily as needed. For pain      . ketoconazole (NIZORAL) 2 % cream Apply  1 application topically daily.        . metoprolol succinate (TOPROL-XL) 50 MG 24 hr tablet Take 25 mg by mouth daily. Take with or immediately following a meal.      . metoprolol succinate (TOPROL-XL) 50 MG 24 hr tablet TAKE HALF TABLET BY MOUTH DAILY  45 tablet  1  . vitamin E 1000 UNIT capsule Take 1,000 Units by mouth daily.          Allergies  Allergen Reactions  . Nitroglycerin Anaphylaxis    Hypotensive after NTG administration in ER during chest pain evaluation  . Pentosan Polysulfate Sodium     Elevated LFTs.......Marland Kitchen elmiron   . Pentosan Polysulfate Sodium   . Promethazine Hcl   . Lisinopril Cough  . Sulfamethoxazole W-Trimethoprim Nausea Only    family history includes Asthma in her mother; Cancer in her maternal aunt, paternal aunt, and sister; Cirrhosis in her father; Coronary artery disease in her maternal grandfather and mother; Dementia in her mother; Diabetes in her father, paternal aunt, and paternal grandmother; Hypertension in her mother; Osteoporosis in her mother; and Stroke in her mother.   reports that she has never smoked. She  has never used smokeless tobacco. She reports that she does not drink alcohol or use illicit drugs.  Review of Systems   Constitutional: Negative for fever and unexpected weight change.  HENT: Positive for sneezing, trouble swallowing and sinus pressure. Negative for ear pain, nosebleeds, congestion, sore throat, rhinorrhea, dental problem and postnasal drip.   Eyes: Negative for redness and itching.  Respiratory: Positive for shortness of breath. Negative for cough, chest tightness and wheezing.   Cardiovascular: Positive for leg swelling. Negative for palpitations.  Gastrointestinal: Negative for nausea and vomiting.  Genitourinary: Negative for dysuria.  Musculoskeletal: Positive for joint swelling.  Skin: Negative for rash.  Neurological: Negative for headaches.  Hematological: Does not bruise/bleed easily.  Psychiatric/Behavioral: Negative for dysphoric mood. The patient is not nervous/anxious.     Physical Exam: BP 120/78  Pulse 49  Temp(Src) 97.6 F (36.4 C) (Oral)  Ht 5\' 3"  (1.6 m)  Wt 175 lb 3.2 oz (79.47 kg)  BMI 31.04 kg/m2  SpO2 96% Body mass index is 31.04 kg/(m^2).   General - No distress HEENT - PERRLA, EOMI, deviated septum, no sinus tenderness, MP 4, enlarged tongue, no LAN Cardiac - s1s2 regular, no murmur Chest - no wheeze/rales Abdomen - soft, nontender Extremities - no edema Neurologic - normal strength, CN intact Skin - no rashes Psychiatric - normal mood, behavior  Assessment/Plan:  Outpatient Encounter Prescriptions as of 12/27/2011  Medication Sig Dispense Refill  . aspirin 81 MG tablet Take 81 mg by mouth daily.        . Calcium Citrate-Vitamin D (CITRACAL + D PO) Take 1 tablet by mouth 2 (two) times daily.       . cetirizine (ZYRTEC) 10 MG tablet Take 10 mg by mouth daily.        . Cholecalciferol (VITAMIN D3) 2000 UNITS TABS Take 2,000 Units by mouth daily.       . cycloSPORINE (RESTASIS) 0.05 % ophthalmic emulsion Place 1 drop into both eyes  2 (two) times daily.       Marland Kitchen doxycycline (MONODOX) 50 MG capsule Take 50 mg by mouth daily.      . Estradiol (VAGIFEM) 10 MCG TABS Place 10 mcg vaginally 2 (two) times a week. Take on Monday and Thursday      . furosemide (LASIX) 40 MG tablet  Take 20 mg by mouth daily.      Marland Kitchen ibuprofen (ADVIL,MOTRIN) 800 MG tablet Take 800 mg by mouth 3 (three) times daily as needed. For pain      . ketoconazole (NIZORAL) 2 % cream Apply 1 application topically daily.        . metoprolol succinate (TOPROL-XL) 50 MG 24 hr tablet Take 25 mg by mouth daily. Take with or immediately following a meal.      . metoprolol succinate (TOPROL-XL) 50 MG 24 hr tablet TAKE HALF TABLET BY MOUTH DAILY  45 tablet  1  . vitamin E 1000 UNIT capsule Take 1,000 Units by mouth daily.          Jacque Byron Pager:  [phone] 12/27/2011, 2:46 PM

## 2011-12-27 NOTE — Patient Instructions (Signed)
Will arrange for sleep study Will call to schedule follow up after sleep study reviewed 

## 2011-12-27 NOTE — Assessment & Plan Note (Signed)
She has snoring, sleep disruption, and daytime sleepiness.  She has history of HTN.  I am concerned she could have sleep apnea.  I have explained how sleep apnea can affect the patient's health.  Driving precautions and importance of weight loss were discussed.  Treatment options for sleep apnea were reviewed.  To further assess will arrange for in lab sleep study.   

## 2011-12-27 NOTE — Progress Notes (Deleted)
  Subjective:    Patient ID: AMIYRAH LAMERE, female    DOB: 07/11/1946, 66 y.o.   MRN: [id number]  HPI    Review of Systems  Constitutional: Negative for fever and unexpected weight change.  HENT: Positive for sneezing, trouble swallowing and sinus pressure. Negative for ear pain, nosebleeds, congestion, sore throat, rhinorrhea, dental problem and postnasal drip.   Eyes: Negative for redness and itching.  Respiratory: Positive for shortness of breath. Negative for cough, chest tightness and wheezing.   Cardiovascular: Positive for leg swelling. Negative for palpitations.  Gastrointestinal: Negative for nausea and vomiting.  Genitourinary: Negative for dysuria.  Musculoskeletal: Positive for joint swelling.  Skin: Negative for rash.  Neurological: Negative for headaches.  Hematological: Does not bruise/bleed easily.  Psychiatric/Behavioral: Negative for dysphoric mood. The patient is not nervous/anxious.        Objective:   Physical Exam        Assessment & Plan:

## 2012-01-02 ENCOUNTER — Encounter (HOSPITAL_BASED_OUTPATIENT_CLINIC_OR_DEPARTMENT_OTHER): Payer: Self-pay

## 2012-01-02 ENCOUNTER — Emergency Department (HOSPITAL_BASED_OUTPATIENT_CLINIC_OR_DEPARTMENT_OTHER)
Admission: EM | Admit: 2012-01-02 | Discharge: 2012-01-02 | Disposition: A | Discharge status: Home / Self Care | Payer: Medicare Other | Attending: Emergency Medicine | Admitting: Emergency Medicine

## 2012-01-02 ENCOUNTER — Emergency Department (HOSPITAL_BASED_OUTPATIENT_CLINIC_OR_DEPARTMENT_OTHER): Payer: Medicare Other

## 2012-01-02 DIAGNOSIS — R29810 Facial weakness: Secondary | ICD-10-CM | POA: Insufficient documentation

## 2012-01-02 DIAGNOSIS — I10 Essential (primary) hypertension: Secondary | ICD-10-CM | POA: Insufficient documentation

## 2012-01-02 DIAGNOSIS — E785 Hyperlipidemia, unspecified: Secondary | ICD-10-CM | POA: Insufficient documentation

## 2012-01-02 DIAGNOSIS — K219 Gastro-esophageal reflux disease without esophagitis: Secondary | ICD-10-CM | POA: Diagnosis not present

## 2012-01-02 DIAGNOSIS — M542 Cervicalgia: Secondary | ICD-10-CM | POA: Insufficient documentation

## 2012-01-02 DIAGNOSIS — R5383 Other fatigue: Secondary | ICD-10-CM | POA: Diagnosis not present

## 2012-01-02 DIAGNOSIS — G510 Bell's palsy: Secondary | ICD-10-CM | POA: Diagnosis not present

## 2012-01-02 DIAGNOSIS — R209 Unspecified disturbances of skin sensation: Secondary | ICD-10-CM | POA: Insufficient documentation

## 2012-01-02 DIAGNOSIS — R5381 Other malaise: Secondary | ICD-10-CM | POA: Diagnosis not present

## 2012-01-02 LAB — DIFFERENTIAL
Eosinophils Absolute: 0.1 10*3/uL (ref 0.0–0.7)
Eosinophils Relative: 1 % (ref 0–5)
Lymphs Abs: 1.6 10*3/uL (ref 0.7–4.0)
Monocytes Absolute: 0.6 10*3/uL (ref 0.1–1.0)
Monocytes Relative: 7 % (ref 3–12)

## 2012-01-02 LAB — BASIC METABOLIC PANEL
BUN: 17 mg/dL (ref 6–23)
Calcium: 10.4 mg/dL (ref 8.4–10.5)
Creatinine, Ser: 1.2 mg/dL — ABNORMAL HIGH (ref 0.50–1.10)
GFR calc non Af Amer: 46 mL/min — ABNORMAL LOW (ref >90)
Glucose, Bld: 111 mg/dL — ABNORMAL HIGH (ref 70–99)
Potassium: 3.8 mEq/L (ref 3.5–5.1)

## 2012-01-02 LAB — CBC
HCT: 38.1 % (ref 36.0–46.0)
Hemoglobin: 13.5 g/dL (ref 12.0–15.0)
MCH: 29.9 pg (ref 26.0–34.0)
MCV: 84.5 fL (ref 78.0–100.0)
RBC: 4.51 MIL/uL (ref 3.87–5.11)

## 2012-01-02 MED ORDER — PREDNISONE 10 MG PO TABS: 50.0000 mg | ORAL_TABLET | Freq: Every day | ORAL | Status: DC

## 2012-01-02 NOTE — ED Notes (Signed)
Pt reports onset of left sided facial droop and numbness this am at 0900.

## 2012-01-02 NOTE — ED Provider Notes (Addendum)
History     CSN: [id number]  Arrival date & time 01/02/12  1752   First MD Initiated Contact with Patient 01/02/12 1802      Chief Complaint  Patient presents with  . Facial Droop     HPI Patient awoke this morning and noticed she had left-sided facial weakness and numbness.  Patient denies speech problems or difficulty with extremity weakness or numbness.  Patient has no history of strokes in the past.  Patient does have cervical spine problems and has had some neck pain.  This pain is fairly chronic. Past Medical History  Diagnosis Date  . Hypertension   . Allergy     seasonal  . GERD (gastroesophageal reflux disease)   . Hyperlipidemia     NMR 2005  . Osteopenia     [facility]  . Hx of colonic polyps   . Diverticulosis of colon   . Interstitial cystitis   . Vitamin d deficiency   . Xerostomia     and Xeroophthalmia  . Arthritis     Past Surgical History  Procedure Date  . Total abdominal hysterectomy w/ bilateral salpingoophorectomy     dysfunctional menses  . Bladder surgery     for incontinence  . G 1 p 1   . Colonscopy     tics 10-2002  . Interstitial cystitis     with clot in catheter  . Elevated lfts     due to Elmiron  . 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 Coronary artery disease Mother   . Hypertension Mother   . Asthma Mother   . Osteoporosis Mother   . Dementia Mother   . Stroke Mother     mini cva  . Diabetes Father   . Cancer Sister     colon  . Cancer Paternal Aunt     2 aunts had renal cancer  . Coronary artery disease Maternal Grandfather   . Cirrhosis Father     non alcoholic  . Cancer Maternal Aunt     breast  . Diabetes Paternal Aunt   . Diabetes Paternal Grandmother     History  Substance Use Topics  . Smoking status: Never Smoker   . Smokeless tobacco: Never Used  . Alcohol Use: No    OB History    Grav Para Term Preterm  Abortions TAB SAB Ect Mult Living                  Review of Systems  All other systems reviewed and are negative.    Allergies  Nitroglycerin; Pentosan polysulfate sodium; Pentosan polysulfate sodium; Promethazine hcl; Lisinopril; and Sulfamethoxazole w-trimethoprim  Home Medications   Current Outpatient Rx  Name Route Sig Dispense Refill  . ASPIRIN 81 MG PO TABS Oral Take 81 mg by mouth daily.      Marland Kitchen CITRACAL + D PO Oral Take 1 tablet by mouth 2 (two) times daily.     Marland Kitchen CETIRIZINE HCL 10 MG PO TABS Oral Take 10 mg by mouth daily.      Marland Kitchen VITAMIN D3 2000 UNITS PO TABS Oral Take 2,000 Units by mouth daily.     . CYCLOSPORINE 0.05 % OP EMUL Both Eyes Place 1 drop into both eyes 2 (two) times daily.     Marland Kitchen DOXYCYCLINE MONOHYDRATE 50 MG PO CAPS Oral Take 50 mg by mouth daily.    Marland Kitchen  FUROSEMIDE 40 MG PO TABS Oral Take 20 mg by mouth daily.    Marland Kitchen KETOCONAZOLE 2 % EX CREA Topical Apply 1 application topically daily.      Marland Kitchen VITAMIN E 1000 UNITS PO CAPS Oral Take 1,000 Units by mouth daily.      Marland Kitchen ESTRADIOL 10 MCG VA TABS Vaginal Place 10 mcg vaginally 2 (two) times a week. Take on Monday and Thursday    . IBUPROFEN 800 MG PO TABS Oral Take 800 mg by mouth 3 (three) times daily as needed. For pain    . METOPROLOL SUCCINATE ER 50 MG PO TB24 Oral Take 25 mg by mouth daily. Take with or immediately following a meal.    . PREDNISONE 10 MG PO TABS Oral Take 5 tablets (50 mg total) by mouth daily. Take 5 tablets by mouth daily for 4 days.  Then 4 tablets for 2 days.  Then 3 tablets for 2 days.  Then 1 tablet for 2 days. 36 tablet 0    BP 174/82  Pulse 74  Temp(Src) 98.3 F (36.8 C) (Oral)  Resp 16  Ht 5\' 3"  (1.6 m)  Wt 174 lb (78.926 kg)  BMI 30.82 kg/m2  SpO2 100%  Physical Exam  Nursing note and vitals reviewed. Constitutional: She is oriented to person, place, and time. She appears well-developed and well-nourished. No distress.  HENT:  Head: Normocephalic and atraumatic.  Eyes: Pupils  are equal, round, and reactive to light.  Neck: Normal range of motion.  Cardiovascular: Normal rate and intact distal pulses.        Normal sinus rhythm Rate=76 Left axis deviation Nonspecific T wave abnormality Abnormal ECG Since last tracing Nonspecific T wave abnormality  Pulmonary/Chest: No respiratory distress.  Abdominal: Normal appearance. She exhibits no distension.  Musculoskeletal: Normal range of motion.  Neurological: She is alert and oriented to person, place, and time. She has normal strength. A cranial nerve deficit is present. She displays a negative Romberg sign. GCS eye subscore is 4. GCS verbal subscore is 5. GCS motor subscore is 6.       Pronator drift is negative.  No lower any weakness.   Skin: Skin is warm and dry. No rash noted.  Psychiatric: She has a normal mood and affect. Her behavior is normal.    ED Course  Procedures (including critical care time)   Labs Reviewed  CBC  DIFFERENTIAL  BASIC METABOLIC PANEL  SEDIMENTATION RATE  HEMOGLOBIN A1C   Ct Head Wo Contrast  01/02/2012  *RADIOLOGY REPORT*  Clinical Data: Headache and left-sided facial droop over the past 1 day.  CT HEAD WITHOUT CONTRAST  Technique:  Contiguous axial images were obtained from the base of the skull through the vertex without contrast.  Comparison: None.  Findings: Mild cortical and cerebellar atrophy. Ventricular system normal in size and appearance for age.  No mass lesion.  No midline shift.  No acute hemorrhage or hematoma.  No extra-axial fluid collections.  No evidence of acute infarction.  Physiologic calcifications in the basal ganglia bilaterally.  No skull fracture or other focal osseous abnormality involving the skull.  Visualized paranasal sinuses, mastoid air cells, and middle ear cavities well-aerated.  IMPRESSION:  1.  No acute intracranial abnormality. 2.  Mild cortical and cerebellar atrophy.  Original Report Authenticated By: Arnell Sieving, M.D.     1. Bell  palsy       MDM          Nelia Shi, MD  01/02/12 1902  Nelia Shi, MD 02/11/12 1447

## 2012-01-02 NOTE — ED Notes (Signed)
MD at bedside. 

## 2012-01-02 NOTE — Discharge Instructions (Signed)
Bell's Palsy  Bell's palsy is a condition in which the muscles on one side of the face cannot move (paralysis). This is because the nerves in the face are paralyzed. It is most often thought to be caused by a virus. The virus causes swelling of the nerve that controls movement on one side of the face. The nerve travels through a tight space surrounded by bone. When the nerve swells, it can be compressed by the bone. This results in damage to the protective covering around the nerve. This damage interferes with how the nerve communicates with the muscles of the face. As a result, it can cause weakness or paralysis of the facial muscles.   Injury (trauma), tumor, and surgery may cause Bell's palsy, but most of the time the cause is unknown. It is a relatively common condition. It starts suddenly (abrupt onset) with the paralysis usually ending within 2 days. Bell's palsy is not dangerous. But because the eye does not close properly, you may need care to keep the eye from getting dry. This can include splinting (to keep the eye shut) or moistening with artificial tears. Bell's palsy very seldom occurs on both sides of the face at the same time.  SYMPTOMS    Eyebrow sagging.   Drooping of the eyelid and corner of the mouth.   Inability to close one eye.   Loss of taste on the front of the tongue.   Sensitivity to loud noises.  TREATMENT   The treatment is usually non-surgical. If the patient is seen within the first 24 to 48 hours, a short course of steroids may be prescribed, in an attempt to shorten the length of the condition. Antiviral medicines may also be used with the steroids, but it is unclear if they are helpful.   You will need to protect your eye, if you cannot close it. The cornea (clear covering over your eye) will become dry and can be damaged. Artificial tears can be used to keep your eye moist. Glasses or an eye patch should be worn to protect your eye.  PROGNOSIS   Recovery is variable, ranging  from days to months. Although the problem usually goes away completely (about 80% of cases resolve), predicting the outcome is impossible. Most people improve within 3 weeks of when the symptoms began. Improvement may continue for 3 to 6 months. A small number of people have moderate to severe weakness that is permanent.   HOME CARE INSTRUCTIONS    If your caregiver prescribed medication to reduce swelling in the nerve, use as directed. Do not stop taking the medication unless directed by your caregiver.   Use moisturizing eye drops as needed to prevent drying of your eye, as directed by your caregiver.   Protect your eye, as directed by your caregiver.   Use facial massage and exercises, as directed by your caregiver.   Perform your normal activities, and get your normal rest.  SEEK IMMEDIATE MEDICAL CARE IF:    There is pain, redness or irritation in the eye.   You or your child has an oral temperature above 102 F (38.9 C), not controlled by medicine.  MAKE SURE YOU:    Understand these instructions.   Will watch your condition.   Will get help right away if you are not doing well or get worse.  Document Released: 07/17/2005 Document Revised: 07/06/2011 Document Reviewed: 07/26/2009  [employer] Patient Information 2012 [employer].

## 2012-01-02 NOTE — ED Notes (Signed)
Family at bedside. 

## 2012-01-03 LAB — HEMOGLOBIN A1C: Mean Plasma Glucose: 128 mg/dL — ABNORMAL HIGH (ref <117)

## 2012-01-09 ENCOUNTER — Encounter: Payer: Self-pay | Admitting: Internal Medicine

## 2012-01-09 ENCOUNTER — Ambulatory Visit (INDEPENDENT_AMBULATORY_CARE_PROVIDER_SITE_OTHER): Payer: Medicare Other | Admitting: Internal Medicine

## 2012-01-09 VITALS — BP 164/96 | HR 60 | Ht 62.5 in | Wt 171.0 lb

## 2012-01-09 DIAGNOSIS — I10 Essential (primary) hypertension: Secondary | ICD-10-CM | POA: Diagnosis not present

## 2012-01-09 DIAGNOSIS — G609 Hereditary and idiopathic neuropathy, unspecified: Secondary | ICD-10-CM | POA: Diagnosis not present

## 2012-01-09 DIAGNOSIS — G629 Polyneuropathy, unspecified: Secondary | ICD-10-CM

## 2012-01-09 DIAGNOSIS — R7309 Other abnormal glucose: Secondary | ICD-10-CM

## 2012-01-09 DIAGNOSIS — D649 Anemia, unspecified: Secondary | ICD-10-CM

## 2012-01-09 DIAGNOSIS — G589 Mononeuropathy, unspecified: Secondary | ICD-10-CM

## 2012-01-09 DIAGNOSIS — G510 Bell's palsy: Secondary | ICD-10-CM

## 2012-01-09 MED ORDER — AMLODIPINE BESYLATE 5 MG PO TABS: 5.0000 mg | ORAL_TABLET | Freq: Every day | ORAL | Status: DC

## 2012-01-09 NOTE — Patient Instructions (Signed)
Blood Pressure Goal  Ideally is an AVERAGE < 135/85. This AVERAGE should be calculated from @ least 5-7 BP readings taken @ different times of day on different days of week. You should not respond to isolated BP readings , but rather the AVERAGE for that week  Please try to go on My Chart within the next 24 hours to allow me to release the results directly to you.  

## 2012-01-09 NOTE — Progress Notes (Signed)
Subjective:    Patient ID: [name], female    DOB: 03-26-1946, 66 y.o.   MRN: [id number]  HPI  She awoke 01/02/12 and noted some facial asymmetry in the mirror. She had noted some sinus pressure on 6/2 and some sharp pain in left ear several days later.  She was seen in the emergency room and prednisone was prescribed. Labs were reviewed. Glucose was 128, creatinine 1.2, GFR 46. CBC and differential and sedimentation rate were normal. CAT scan revealed no acute intracranial abnormality.  Symptoms have remained essentially the same. She has been practicing I hygiene with hydration and nocturnal protection.  She has had some difficulty with enunciation and numbness in the tongue, but no other neurologic signs.  She has questions seen Dr. Angelyn Punt, Neurosurgeon at [facility] .    Review of Systems She attributes her blood pressure rise to concern over the Bell's palsy.  Blood pressure range: not checked  Chest pain: no  Dyspnea: no   Claudication:not usually   Medication compliance: no  Medication Side Effects  Lightheadedness: since steroids started  Urinary frequency: no   Edema: hands swell when walking   Preventitive Healthcare:  Exercise: walking 3X/ week 2 mpd   Diet Pattern: no  Salt Restriction: yes  She has a history of anemia and to B12 shots in college       Objective:   Physical Exam Gen.:  well-nourished in appearance. Alert, appropriate and cooperative throughout exam. Head: Normocephalic without obvious abnormalities  Eyes: No corneal or conjunctival inflammation noted. Pupils equal round reactive to light and accommodation.  Extraocular motion intact. Vision grossly normal.Asymmetry of was related to Bell's palsy. External  ear exam reveals no significant lesions or deformities. Canals clear .TMs normal. Hearing is grossly normal bilaterally. Nose: External nasal exam reveals no deformity or inflammation. Nasal mucosa are pink and moist. No lesions or  exudates noted.   Mouth: Oral mucosa and oropharynx reveal no lesions or exudates. Teeth in good repair. Neck: No deformities, masses, or tenderness noted.  Lungs: Normal respiratory effort; chest expands symmetrically. Lungs are clear to auscultation without rales, wheezes, or increased work of breathing. Heart: Normal rate and rhythm. Normal S1 and S2. No gallop, click, or rub. S 4 w/o  murmur. Abdomen: Bowel sounds normal; abdomen soft and nontender. No masses, organomegaly or hernias noted.                                                                       Musculoskeletal/extremities:  No clubbing, cyanosis, edema, or deformity noted. Tone & strength  normal.Joints normal. Nail health  good. Vascular: Carotid, radial artery, dorsalis pedis and  posterior tibial pulses are full and equal. No bruits present. Neurologic: Alert and oriented x3. Deep tendon reflexes symmetrical and normal. Classic stigmata of left-sided Bell's palsy.       Skin: Intact without suspicious lesions or rashes. Lymph: No cervical, axillary lymphadenopathy present. Psych: Mood and affect are normal. 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  #2 hypertension, uncontrolled. Probable exacerbation related to stress #1  Plan: Literature was reviewed; antivirals have not been shown to be of benefit when added to steroids. There is suggestion that acupuncture may be of benefit. A copy of the references was provided to her. Surgical decompression has not been shown to be of benefit. The literature suggests that B12 might have some benefit in treating Bell's palsy

## 2012-01-10 ENCOUNTER — Telehealth: Payer: Self-pay | Admitting: *Deleted

## 2012-01-10 ENCOUNTER — Other Ambulatory Visit: Payer: Self-pay | Admitting: Internal Medicine

## 2012-01-10 DIAGNOSIS — G510 Bell's palsy: Secondary | ICD-10-CM

## 2012-01-10 LAB — POTASSIUM: Potassium: 3.2 mEq/L — ABNORMAL LOW (ref 3.5–5.1)

## 2012-01-10 LAB — CREATININE, SERUM: Creatinine, Ser: 1.1 mg/dL (ref 0.4–1.2)

## 2012-01-10 NOTE — Telephone Encounter (Signed)
Pt came into the office & stated that she would like to be referred to Dr. Brooke Pace @ [facility] due to her Bell's Palsy. Please advise.

## 2012-01-12 DIAGNOSIS — G510 Bell's palsy: Secondary | ICD-10-CM | POA: Diagnosis not present

## 2012-01-12 DIAGNOSIS — G519 Disorder of facial nerve, unspecified: Secondary | ICD-10-CM | POA: Diagnosis not present

## 2012-01-16 DIAGNOSIS — H16109 Unspecified superficial keratitis, unspecified eye: Secondary | ICD-10-CM | POA: Diagnosis not present

## 2012-01-21 ENCOUNTER — Encounter (HOSPITAL_BASED_OUTPATIENT_CLINIC_OR_DEPARTMENT_OTHER): Payer: Medicare Other

## 2012-01-31 ENCOUNTER — Other Ambulatory Visit (INDEPENDENT_AMBULATORY_CARE_PROVIDER_SITE_OTHER): Payer: Medicare Other

## 2012-01-31 DIAGNOSIS — G510 Bell's palsy: Secondary | ICD-10-CM | POA: Diagnosis not present

## 2012-01-31 DIAGNOSIS — E876 Hypokalemia: Secondary | ICD-10-CM | POA: Diagnosis not present

## 2012-01-31 DIAGNOSIS — H903 Sensorineural hearing loss, bilateral: Secondary | ICD-10-CM | POA: Diagnosis not present

## 2012-01-31 DIAGNOSIS — G519 Disorder of facial nerve, unspecified: Secondary | ICD-10-CM | POA: Diagnosis not present

## 2012-02-23 ENCOUNTER — Other Ambulatory Visit: Payer: Self-pay | Admitting: Internal Medicine

## 2012-02-23 DIAGNOSIS — Z1231 Encounter for screening mammogram for malignant neoplasm of breast: Secondary | ICD-10-CM

## 2012-02-26 ENCOUNTER — Encounter: Payer: Self-pay | Admitting: Internal Medicine

## 2012-02-26 ENCOUNTER — Ambulatory Visit (INDEPENDENT_AMBULATORY_CARE_PROVIDER_SITE_OTHER): Payer: Medicare Other | Admitting: Internal Medicine

## 2012-02-26 VITALS — BP 136/78 | HR 54 | Temp 98.2°F | Wt 173.0 lb

## 2012-02-26 DIAGNOSIS — Z8669 Personal history of other diseases of the nervous system and sense organs: Secondary | ICD-10-CM | POA: Diagnosis not present

## 2012-02-26 DIAGNOSIS — I8310 Varicose veins of unspecified lower extremity with inflammation: Secondary | ICD-10-CM | POA: Diagnosis not present

## 2012-02-26 DIAGNOSIS — I872 Venous insufficiency (chronic) (peripheral): Secondary | ICD-10-CM

## 2012-02-26 DIAGNOSIS — F430 Acute stress reaction: Secondary | ICD-10-CM

## 2012-02-26 DIAGNOSIS — F439 Reaction to severe stress, unspecified: Secondary | ICD-10-CM

## 2012-02-26 MED ORDER — LORAZEPAM 1 MG PO TABS: ORAL_TABLET | ORAL | Status: DC

## 2012-02-26 MED ORDER — ZOSTER VACCINE LIVE 19400 UNT/0.65ML ~~LOC~~ SOLR: 0.6500 mL | Freq: Once | SUBCUTANEOUS | Status: DC

## 2012-02-26 NOTE — Patient Instructions (Addendum)
Apply Cort Aid OTC twice a day to the involved tissues.   Wear over the calf support hose if you are standing for prolonged periods of  time or working on your feet for prolonged periods of time.

## 2012-02-26 NOTE — Progress Notes (Signed)
  Subjective:    Patient ID: [name], female    DOB: 1945-12-24, 66 y.o.   MRN: [id number]  HPI  Her Bell's palsy has resolved completely following a course of acyclovir and prednisone from Dr. Suszanne Conners, ENT. She had been given a referral to [facility]; she did not pursue that course. She denies fever or sweats. She has occasional chills.  6 days ago she developed a nonpruritic rash at the left medial ankle after mowing the grass. 2 days ago a similar rash developed at the right medial ankle. Subjectively this has improved; she did use a cream twice    Review of Systems She denies significant headaches, numbness or tingling, limb weakness, or stool or urine incontinence. She has been under stress related to her husband's impending CNS surgery for an incidentally noted aneurysm for which craniotomy is planned this week.     Objective:   Physical Exam  She appears healthy and well-nourished  Pupils are small but equal. Extraocular motion is intact. She does have nonsustained vertical nystagmus. Light touch is normal bilaterally on the face. Field of vision is normal  She has no lymphadenopathy about the neck or axilla  Deep tendon reflexes are 1/2+ and equal at the knees and one half-1+ at the biceps tendon. Strength and tone are normal  Gait, Romberg testing, and finger-nose testing are all normal.  She has a faint, irregular, nonblanching rash at both ankles which appears bland . There is no evidence of cellulitis or vesicles.        Assessment & Plan:  #1 status post Bell's palsy; no residual neurologic deficit present  #2 medial malleolar area rash. This appears to be more vascular related than inflammatory.No clinical evidence of cellulitis  #3 exogenous stress  Plan: See orders & recommendations

## 2012-03-26 ENCOUNTER — Encounter: Payer: Self-pay | Admitting: Internal Medicine

## 2012-03-26 ENCOUNTER — Ambulatory Visit (INDEPENDENT_AMBULATORY_CARE_PROVIDER_SITE_OTHER): Payer: Medicare Other | Admitting: Internal Medicine

## 2012-03-26 VITALS — BP 130/72 | HR 70 | Temp 98.6°F | Wt 170.4 lb

## 2012-03-26 DIAGNOSIS — J3489 Other specified disorders of nose and nasal sinuses: Secondary | ICD-10-CM | POA: Diagnosis not present

## 2012-03-26 NOTE — Progress Notes (Signed)
  Subjective:    Patient ID: [name], female    DOB: 1946-05-24, 66 y.o.   MRN: [id number]  HPI Sinus Symptoms:                                                                                                                                      Onset: 03/21/12  as facial flushing with warmth to touch Chills as of 8/25 w/o fever, facial pain or frontal headache but nasal pain. Sore throat with cervical LA & dental pain.Clear nasal discharge. L ear pain which is chronic   Better with: NSAIDS  Her husband is @ [facility] post neurosurgical procedure; she is afraid of communicable illness      Review of Systems Cough: not significant  Change in vision: no        Objective:   Physical Exam General appearance:good health ;well nourished; no acute distress or increased work of breathing is present. Appears fatigued.  No  lymphadenopathy about the head, neck, or axilla noted.   Eyes: No conjunctival inflammation or lid edema is present. There is no scleral icterus. Vertical nystagmus  Ears:  External ear exam shows no significant lesions or deformities.  Otoscopic examination reveals clear canals, tympanic membranes are intact bilaterally without bulging, retraction, inflammation or discharge.  Nose:  External nasal examination shows no deformity or inflammation. Nasal mucosa are pink and moist without lesions or exudates. No septal dislocation or deviation.No obstruction to airflow.   Oral exam: Dental hygiene is good; lips and gums are healthy appearing.There is no oropharyngeal erythema or exudate noted.   Neck:  No deformities,  masses, or tenderness noted.   Supple with full range of motion without pain.   Heart:  Normal rate and regular rhythm.Accentuated S1 ; S2 normal without gallop, murmur, click, rub or other extra sounds.   Lungs:Chest clear to auscultation; no wheezes, rhonchi,rales ,or rubs present.No increased work of breathing.    Extremities:  No cyanosis, edema, or  clubbing  noted    Skin: Warm & dry w/o jaundice           Assessment & Plan:  #1 sinus pressure w/o rhinoconjunctivitis Plan: See orders and recommendations

## 2012-03-26 NOTE — Patient Instructions (Addendum)
Options for respiratory  symptoms include the following:   Zicam Melts or Zinc lozenges if throat sore ; vitamin C 2000 mg daily; & Echinacea for 4-7 days.   Plain Mucinex for thick secretions ;force NON dairy fluids . Use a Neti pot daily as needed for sinus congestion; going from open side to congested side . Nasal cleansing in the shower as discussed. Make sure that all residual soap is removed to prevent irritation. Nasonex  1 spray in each nostril twice a day as needed. Use the "crossover" technique as discussed. Plain Allegra 160 daily as needed for itchy eyes & sneezing.

## 2012-04-07 ENCOUNTER — Other Ambulatory Visit: Payer: Self-pay | Admitting: Internal Medicine

## 2012-04-08 ENCOUNTER — Ambulatory Visit: Payer: Medicare Other

## 2012-04-11 DIAGNOSIS — H00029 Hordeolum internum unspecified eye, unspecified eyelid: Secondary | ICD-10-CM | POA: Diagnosis not present

## 2012-04-12 ENCOUNTER — Other Ambulatory Visit: Payer: Self-pay | Admitting: Internal Medicine

## 2012-05-07 ENCOUNTER — Ambulatory Visit (INDEPENDENT_AMBULATORY_CARE_PROVIDER_SITE_OTHER): Payer: Medicare Other

## 2012-05-07 DIAGNOSIS — Z23 Encounter for immunization: Secondary | ICD-10-CM | POA: Diagnosis not present

## 2012-05-14 ENCOUNTER — Other Ambulatory Visit: Payer: Self-pay

## 2012-05-14 MED ORDER — MOMETASONE FUROATE 50 MCG/ACT NA SUSP: 2.0000 | Freq: Every day | NASAL | Status: DC

## 2012-05-14 NOTE — Telephone Encounter (Signed)
OK X 1; refill X 1 year

## 2012-05-14 NOTE — Telephone Encounter (Signed)
Pt states you gave her samples they worked great and she wants Rx. Last OV 03/26/12. PLz advise    MW

## 2012-05-16 ENCOUNTER — Ambulatory Visit
Admission: RE | Admit: 2012-05-16 | Discharge: 2012-05-16 | Disposition: A | Discharge status: Home / Self Care | Payer: Medicare Other | Source: Ambulatory Visit | Attending: Internal Medicine | Admitting: Internal Medicine

## 2012-05-16 DIAGNOSIS — Z1231 Encounter for screening mammogram for malignant neoplasm of breast: Secondary | ICD-10-CM | POA: Diagnosis not present

## 2012-05-21 DIAGNOSIS — G510 Bell's palsy: Secondary | ICD-10-CM | POA: Diagnosis not present

## 2012-05-21 DIAGNOSIS — J343 Hypertrophy of nasal turbinates: Secondary | ICD-10-CM | POA: Diagnosis not present

## 2012-05-21 DIAGNOSIS — J310 Chronic rhinitis: Secondary | ICD-10-CM | POA: Diagnosis not present

## 2012-05-27 ENCOUNTER — Ambulatory Visit (INDEPENDENT_AMBULATORY_CARE_PROVIDER_SITE_OTHER): Payer: Medicare Other | Admitting: Internal Medicine

## 2012-05-27 ENCOUNTER — Encounter: Payer: Self-pay | Admitting: Internal Medicine

## 2012-05-27 VITALS — BP 132/80 | HR 61 | Wt 171.6 lb

## 2012-05-27 DIAGNOSIS — Z23 Encounter for immunization: Secondary | ICD-10-CM

## 2012-05-27 DIAGNOSIS — F418 Other specified anxiety disorders: Secondary | ICD-10-CM

## 2012-05-27 DIAGNOSIS — F411 Generalized anxiety disorder: Secondary | ICD-10-CM | POA: Diagnosis not present

## 2012-05-27 DIAGNOSIS — I10 Essential (primary) hypertension: Secondary | ICD-10-CM | POA: Diagnosis not present

## 2012-05-27 LAB — TSH: TSH: 3.65 u[IU]/mL (ref 0.35–5.50)

## 2012-05-27 MED ORDER — ZOSTER VACCINE LIVE 19400 UNT/0.65ML ~~LOC~~ SOLR: 0.6500 mL | Freq: Once | SUBCUTANEOUS | Status: DC

## 2012-05-27 NOTE — Patient Instructions (Addendum)
Please review the mental health benefits available to you; this will be  on the back of your insurance card as a [id number].   Exercise  30-45  minutes a day, 3-4 days a week. Walking is especially valuable in preventing Osteoporosis. Blood Pressure Goal  Ideally is an AVERAGE < 135/85. This AVERAGE should be calculated from @ least 5-7 BP readings taken @ different times of day on different days of week. You should not respond to isolated BP readings , but rather the AVERAGE for that week.

## 2012-05-27 NOTE — Assessment & Plan Note (Signed)
Blood pressure monitor and encouraged; if the average is greater than 135/85 amlodipine 5 mg one half pill daily would be recommended

## 2012-05-27 NOTE — Addendum Note (Signed)
Addended by: Maurice Small on: 05/27/2012 09:21 AM   Modules accepted: Orders

## 2012-05-27 NOTE — Progress Notes (Signed)
Subjective:    Patient ID: [name], female    DOB: 05/11/1946, 66 y.o.   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 She did take lorazepam 1 mg one half every 8-12 hours as needed for the first 2 weeks on was hospitalized; but she felt it caused excessive drowsiness. During this time she was up for prolonged periods of time with markedly disturbed sleep pattern.   She is not depressed; she denies anhedonia per se. She is not having panic attacks. She does have some difficulty going to sleep at times and her sleep was disturbed as her husband gets up at least 4 times a night. She denies anorexia or excessive fatigue.  Review of Systems Neurologic signs/symptoms: No headache.Ongoing hand numbness and tingling w/o weakness. CTS had to be deelayed Endocrinologic signs and symptoms: No hoarseness, weight change, vision change,  bowel changes, skin/nail changes. Some increased hair loss.Some heat   Intolerance. No family history of mental health issues, alcoholism or drug abuse.  She describes some anxiety and adjustment issues after returning from being stationed in the [place] with her husband while he was in the service.  She has not been taking her amlodipine; blood pressures have been averaging in the 130s /?.         Objective:   Physical Exam Gen.: Healthy and well-nourished in appearance. Alert, appropriate and cooperative throughout exam.  Eyes: No corneal or conjunctival inflammation noted. No lid lag or proptosis Extraocular motion intact. Vision grossly normal.Slight unsustained vertical nystagmus  Neck: No deformities, masses, or tenderness noted.  Thyroid normal. Lungs: Normal respiratory effort; chest expands symmetrically. Lungs are clear to auscultation without rales, wheezes, or increased work of breathing. Heart: Normal rate and rhythm. Normal S1 and S2. No gallop, click, or rub. S4 w/o  murmur.                                                                                Musculoskeletal/extremities:  No clubbing, cyanosis, edema, or deformity noted. Tone & strength  normal.Joints normal. Nail health good; no onycholysis. Vascular: Carotid, radial artery, dorsalis pedis and  posterior tibial pulses are full and equal. Neurologic: Alert and oriented x3. Deep tendon reflexes symmetrical but 1/2+ @ knees.          Skin: Intact without suspicious lesions or rashes. Lymph: No cervical, axillary lymphadenopathy present. Psych: Mood and affect are normal. Normally interactive                                                                                         Assessment & Plan:  #1 anxiety, situational. Her response is appropriate. She has found lorazepam 0.5 mg excessively sedating. She'll be asked to take the fourth of the pill every  8-12 hours as needed for anxiety. Graduated exercise program recommended. I have given her the name of a counselor with whom to work

## 2012-06-17 DIAGNOSIS — G5600 Carpal tunnel syndrome, unspecified upper limb: Secondary | ICD-10-CM | POA: Diagnosis not present

## 2012-06-17 DIAGNOSIS — Z1589 Genetic susceptibility to other disease: Secondary | ICD-10-CM | POA: Diagnosis not present

## 2012-06-17 DIAGNOSIS — M2550 Pain in unspecified joint: Secondary | ICD-10-CM | POA: Diagnosis not present

## 2012-06-17 DIAGNOSIS — M1990 Unspecified osteoarthritis, unspecified site: Secondary | ICD-10-CM | POA: Diagnosis not present

## 2012-06-17 DIAGNOSIS — Z79899 Other long term (current) drug therapy: Secondary | ICD-10-CM | POA: Diagnosis not present

## 2012-07-01 DIAGNOSIS — H0019 Chalazion unspecified eye, unspecified eyelid: Secondary | ICD-10-CM | POA: Diagnosis not present

## 2012-07-19 ENCOUNTER — Ambulatory Visit (INDEPENDENT_AMBULATORY_CARE_PROVIDER_SITE_OTHER): Payer: Medicare Other

## 2012-07-19 DIAGNOSIS — Z2911 Encounter for prophylactic immunotherapy for respiratory syncytial virus (RSV): Secondary | ICD-10-CM | POA: Diagnosis not present

## 2012-07-19 DIAGNOSIS — Z0000 Encounter for general adult medical examination without abnormal findings: Secondary | ICD-10-CM

## 2012-09-24 ENCOUNTER — Other Ambulatory Visit: Payer: Self-pay | Admitting: Internal Medicine

## 2012-11-22 ENCOUNTER — Encounter: Payer: Self-pay | Admitting: Nurse Practitioner

## 2012-11-22 ENCOUNTER — Ambulatory Visit (INDEPENDENT_AMBULATORY_CARE_PROVIDER_SITE_OTHER): Payer: Medicare Other | Admitting: Nurse Practitioner

## 2012-11-22 ENCOUNTER — Ambulatory Visit (INDEPENDENT_AMBULATORY_CARE_PROVIDER_SITE_OTHER)
Admission: RE | Admit: 2012-11-22 | Discharge: 2012-11-22 | Disposition: A | Discharge status: Home / Self Care | Payer: Medicare Other | Source: Ambulatory Visit | Attending: Nurse Practitioner | Admitting: Nurse Practitioner

## 2012-11-22 VITALS — BP 140/80 | HR 72 | Resp 12

## 2012-11-22 DIAGNOSIS — S63509A Unspecified sprain of unspecified wrist, initial encounter: Secondary | ICD-10-CM | POA: Diagnosis not present

## 2012-11-22 DIAGNOSIS — S63502A Unspecified sprain of left wrist, initial encounter: Secondary | ICD-10-CM

## 2012-11-22 DIAGNOSIS — M25532 Pain in left wrist: Secondary | ICD-10-CM

## 2012-11-22 DIAGNOSIS — S59909A Unspecified injury of unspecified elbow, initial encounter: Secondary | ICD-10-CM | POA: Diagnosis not present

## 2012-11-22 DIAGNOSIS — M25539 Pain in unspecified wrist: Secondary | ICD-10-CM

## 2012-11-22 DIAGNOSIS — W010XXA Fall on same level from slipping, tripping and stumbling without subsequent striking against object, initial encounter: Secondary | ICD-10-CM

## 2012-11-22 NOTE — Progress Notes (Signed)
  Subjective:    Patient ID: [name], female    DOB: [birth year], 67 y.o.   MRN: [id number]  Wrist Pain  The pain is present in the left wrist. This is a new problem. The current episode started yesterday. There has been a history of trauma. Episode frequency: no pain, only bruising. The problem has been unchanged. The patient is experiencing no pain. Pertinent negatives include no numbness. Exacerbated by: tender to direct palpation over bruise. She has tried nothing for the symptoms. Improvement on treatment: using ice and elevation. osteopenia  Fall The accident occurred 12 to 24 hours ago. The fall occurred while walking. Distance fallen: while walking, tripped on unlevel ground. She landed on grass (slipped off concrete into grass). There was no blood loss. The point of impact was the left wrist, right wrist and right knee. The pain is present in the left wrist (scrape R knee). The patient is experiencing no pain. Pertinent negatives include no numbness. She has tried ice and elevation for the symptoms.      Review of Systems  Constitutional: Negative.   HENT: Negative for neck pain.        Pt has dry eye & mouth syndrome, follwed by opthalmologist and rheumatologist.  Musculoskeletal: Positive for joint swelling (slight swelling over L palm and 2" below wrist, volar aspect) and arthralgias (Pt has migratory arthralgis associated with inflammatory arthritis ).  Skin: Positive for color change (purple bruising noted over palm and 2"below wrist, volar aspect) and wound (scrape R knee, no active bleeding or laceration, mild abrasion L palm).  Neurological: Negative for tremors, weakness and numbness.  Hematological: Does not bruise/bleed easily.  Psychiatric/Behavioral: Negative.        Objective:   Physical Exam  Vitals reviewed. Constitutional: She is oriented to person, place, and time. She appears well-developed and well-nourished. No distress.  HENT:  Head: Normocephalic and  atraumatic.  Eyes: Conjunctivae are normal.  Cardiovascular: Normal rate and regular rhythm.   Musculoskeletal:       Left wrist: She exhibits swelling.       Arms:      Legs: No c/o R wrist, no bruising or abrasion  Neurological: She is alert and oriented to person, place, and time.  Skin: Skin is warm and dry.  Psychiatric: She has a normal mood and affect. Her behavior is normal. Thought content normal.          Assessment & Plan:  Soft tissue injury L wrist and hand. No fracture seen on xray, sent for over read. Thumb spica splint for 2-4 weeks as comfortable, ice for 10 mins several times daily for 1st 24 hours, then alternate heat and ice for comfort. Tylenol as directed on bottle for pain as needed not to exceed 3000 mg daily. Call for re-eval if increase in pain or swelling.

## 2012-11-22 NOTE — Patient Instructions (Signed)
Your xray of L wrist looks unremarkable for fracture. We will treat your wrist injury as a sprain or soft tissue injury. Soft tissue injury can take 4-8 weeks to heal completely. I recommend that you use your wrist splint for 2-4 weeks. You may use it for a shorter or longer period as is comfortable. Ice therapy in the 1st 24 hours is appropriate: 10-15 mins of ice 3-4 times daily. Then you may use alternate with heat therapy: 10 mins heat followed by 10 mins ice 3-4 times daily for several days or as provides comfort. Please call for re-evaluation if you experience increase in pain or swelling. You will notice change in color as the bruise heals.

## 2012-12-04 DIAGNOSIS — L723 Sebaceous cyst: Secondary | ICD-10-CM | POA: Diagnosis not present

## 2012-12-04 DIAGNOSIS — Z01419 Encounter for gynecological examination (general) (routine) without abnormal findings: Secondary | ICD-10-CM | POA: Diagnosis not present

## 2012-12-06 ENCOUNTER — Encounter (HOSPITAL_BASED_OUTPATIENT_CLINIC_OR_DEPARTMENT_OTHER): Payer: Self-pay

## 2012-12-06 ENCOUNTER — Emergency Department (HOSPITAL_BASED_OUTPATIENT_CLINIC_OR_DEPARTMENT_OTHER)
Admission: EM | Admit: 2012-12-06 | Discharge: 2012-12-06 | Disposition: A | Discharge status: Home / Self Care | Payer: Medicare Other | Attending: Emergency Medicine | Admitting: Emergency Medicine

## 2012-12-06 ENCOUNTER — Other Ambulatory Visit: Payer: Self-pay

## 2012-12-06 ENCOUNTER — Emergency Department (HOSPITAL_BASED_OUTPATIENT_CLINIC_OR_DEPARTMENT_OTHER): Payer: Medicare Other

## 2012-12-06 ENCOUNTER — Telehealth: Payer: Self-pay | Admitting: Internal Medicine

## 2012-12-06 DIAGNOSIS — Z79899 Other long term (current) drug therapy: Secondary | ICD-10-CM | POA: Diagnosis not present

## 2012-12-06 DIAGNOSIS — Z8669 Personal history of other diseases of the nervous system and sense organs: Secondary | ICD-10-CM | POA: Diagnosis not present

## 2012-12-06 DIAGNOSIS — M1990 Unspecified osteoarthritis, unspecified site: Secondary | ICD-10-CM | POA: Diagnosis not present

## 2012-12-06 DIAGNOSIS — R110 Nausea: Secondary | ICD-10-CM | POA: Diagnosis not present

## 2012-12-06 DIAGNOSIS — M129 Arthropathy, unspecified: Secondary | ICD-10-CM | POA: Diagnosis not present

## 2012-12-06 DIAGNOSIS — R079 Chest pain, unspecified: Secondary | ICD-10-CM | POA: Diagnosis not present

## 2012-12-06 DIAGNOSIS — Z860100 Personal history of colon polyps, unspecified: Secondary | ICD-10-CM | POA: Insufficient documentation

## 2012-12-06 DIAGNOSIS — Z8719 Personal history of other diseases of the digestive system: Secondary | ICD-10-CM | POA: Diagnosis not present

## 2012-12-06 DIAGNOSIS — I10 Essential (primary) hypertension: Secondary | ICD-10-CM | POA: Diagnosis not present

## 2012-12-06 DIAGNOSIS — M810 Age-related osteoporosis without current pathological fracture: Secondary | ICD-10-CM | POA: Insufficient documentation

## 2012-12-06 DIAGNOSIS — Z8739 Personal history of other diseases of the musculoskeletal system and connective tissue: Secondary | ICD-10-CM | POA: Diagnosis not present

## 2012-12-06 DIAGNOSIS — Z8639 Personal history of other endocrine, nutritional and metabolic disease: Secondary | ICD-10-CM | POA: Insufficient documentation

## 2012-12-06 DIAGNOSIS — Z87448 Personal history of other diseases of urinary system: Secondary | ICD-10-CM | POA: Diagnosis not present

## 2012-12-06 DIAGNOSIS — Z7982 Long term (current) use of aspirin: Secondary | ICD-10-CM | POA: Insufficient documentation

## 2012-12-06 DIAGNOSIS — R0789 Other chest pain: Secondary | ICD-10-CM | POA: Diagnosis not present

## 2012-12-06 DIAGNOSIS — Z862 Personal history of diseases of the blood and blood-forming organs and certain disorders involving the immune mechanism: Secondary | ICD-10-CM | POA: Insufficient documentation

## 2012-12-06 DIAGNOSIS — R0989 Other specified symptoms and signs involving the circulatory and respiratory systems: Secondary | ICD-10-CM | POA: Diagnosis not present

## 2012-12-06 HISTORY — DX: Unspecified osteoarthritis, unspecified site: M19.90

## 2012-12-06 HISTORY — DX: Bell's palsy: G51.0

## 2012-12-06 HISTORY — DX: Ankylosing spondylitis of unspecified sites in spine: M45.9

## 2012-12-06 HISTORY — DX: Dry eye syndrome of unspecified lacrimal gland: H04.129

## 2012-12-06 HISTORY — DX: Genetic susceptibility to other disease: Z15.89

## 2012-12-06 HISTORY — DX: Unspecified urinary incontinence: R32

## 2012-12-06 HISTORY — DX: Adhesive capsulitis of unspecified shoulder: M75.00

## 2012-12-06 HISTORY — DX: Cystitis, unspecified without hematuria: N30.90

## 2012-12-06 LAB — CBC
MCH: 30 pg (ref 26.0–34.0)
MCHC: 34.5 g/dL (ref 30.0–36.0)
MCV: 86.9 fL (ref 78.0–100.0)
Platelets: 225 10*3/uL (ref 150–400)
RBC: 4.13 MIL/uL (ref 3.87–5.11)

## 2012-12-06 LAB — BASIC METABOLIC PANEL
CO2: 26 mEq/L (ref 19–32)
Calcium: 9.7 mg/dL (ref 8.4–10.5)
Creatinine, Ser: 1 mg/dL (ref 0.50–1.10)
GFR calc non Af Amer: 57 mL/min — ABNORMAL LOW (ref >90)

## 2012-12-06 LAB — TROPONIN I: Troponin I: <0.3 ng/mL (ref <0.30)

## 2012-12-06 MED ORDER — IBUPROFEN 200 MG PO TABS
600.0000 mg | ORAL_TABLET | Freq: Once | ORAL | Status: AC
  Administered 2012-12-06: 600 mg via ORAL
  Filled 2012-12-06: qty 1

## 2012-12-06 MED ORDER — ASPIRIN 81 MG PO CHEW
324.0000 mg | CHEWABLE_TABLET | Freq: Once | ORAL | Status: DC
  Filled 2012-12-06: qty 4

## 2012-12-06 MED ORDER — ASPIRIN 81 MG PO CHEW
243.0000 mg | CHEWABLE_TABLET | Freq: Once | ORAL | Status: AC
  Administered 2012-12-06: 243 mg via ORAL

## 2012-12-06 MED ORDER — IBUPROFEN 600 MG PO TABS: 600.0000 mg | ORAL_TABLET | Freq: Three times a day (TID) | ORAL | Status: DC | PRN

## 2012-12-06 NOTE — ED Notes (Addendum)
Pt states she has left sided chest pain, denies SOB. Hx of HTN. Pts husband states she was working in the yard yesterday. Pt also states she had left sided HA yesterday. Pt states she has felt generalized weakness and some dizziness with sensation to vomit but was unable to vomit yesterday. Chest pain continues this morning. Took baby aspirin last night at 9:30pm.

## 2012-12-06 NOTE — ED Provider Notes (Signed)
History     CSN: [id number]  Arrival date & time 12/06/12  0932   First MD Initiated Contact with Patient 12/06/12 [phone]      Chief Complaint  Patient presents with  . Chest Pain    HPI Patient reports developing left-sided chest discomfort described as an ache which began several hours after working in the yard most of yesterday.  Had one episode of nausea and dry heaves.  No diaphoresis.  No significant shortness of breath.  No history of cardiac disease.  She does have a history of hypertension and questionable hyperlipidemia.  She does not smoke cigarettes.  No family history of heart disease.  The patient's pain and discomfort lasted most of the evening and she woke with ongoing pain all today.  Her pain was worse when she lays on her stomach last night when sleeping.  No pain with range of motion of her arms.  She denies new lower commit edema.  No new orthopnea.  She continues to sleep on one pillow.  She called her primary care physician for followup today who recommended that she come to the emergency department after a phone triage.   Past Medical History  Diagnosis Date  . Hypertension   . Allergy     seasonal  . GERD (gastroesophageal reflux disease)   . Hyperlipidemia     NMR 2005  . Osteopenia     [facility]  . Hx of colonic polyps   . Diverticulosis of colon   . Interstitial cystitis   . Vitamin D deficiency   . Xerostomia     and Xeroophthalmia  . Arthritis   . Dry eye syndrome   . Osteoarthritis   . Cystitis   . Incontinence   . Ankylosing spondylitis   . HLA B27 (HLA B27 positive)   . Frozen shoulder   . Bell palsy     Past Surgical History  Procedure Laterality Date  . Total abdominal hysterectomy w/ bilateral salpingoophorectomy      dysfunctional menses  . Bladder surgery      for incontinence  . G 1 p 1    . Colonscopy      tics 10-2002  . Interstitial cystitis      with clot in catheter  . Elevated lfts      due to  [place]  . Colonoscopy w/ polypectomy      adenomatous poyp 04-2008    Family History  Problem Relation Age of Onset  . Coronary artery disease Mother   . Hypertension Mother   . Asthma Mother   . Osteoporosis Mother   . Dementia Mother   . Stroke Mother     mini cva  . Diabetes Father   . Cancer Sister     colon  . Cancer Paternal Aunt     2 aunts had renal cancer  . Coronary artery disease Maternal Grandfather   . Cirrhosis Father     non alcoholic  . Cancer Maternal Aunt     breast  . Diabetes Paternal Aunt   . Diabetes Paternal Grandmother     History  Substance Use Topics  . Smoking status: Never Smoker   . Smokeless tobacco: Never Used  . Alcohol Use: No    OB History   Grav Para Term Preterm Abortions TAB SAB Ect Mult Living                  Review of Systems  All other systems reviewed and are negative.    Allergies  Nitroglycerin; Pentosan polysulfate sodium; Pentosan polysulfate sodium; Promethazine hcl; Sulfa antibiotics; Lisinopril; and Sulfamethoxazole w-trimethoprim  Home Medications   Current Outpatient Rx  Name  Route  Sig  Dispense  Refill  . aspirin 81 MG tablet   Oral   Take 81 mg by mouth daily.           . cetirizine (ZYRTEC) 10 MG tablet   Oral   Take 10 mg by mouth daily.           . cycloSPORINE (RESTASIS) 0.05 % ophthalmic emulsion   Both Eyes   Place 1 drop into both eyes 2 (two) times daily.          Marland Kitchen doxycycline (MONODOX) 50 MG capsule   Oral   Take 50 mg by mouth daily.         . furosemide (LASIX) 40 MG tablet   Oral   Take 20 mg by mouth daily.         . furosemide (LASIX) 40 MG tablet      TAKE HALF TABLET BY MOUTH DAILY   45 tablet   1   . ibuprofen (ADVIL,MOTRIN) 800 MG tablet   Oral   Take 800 mg by mouth 3 (three) times daily as needed. For pain         . ketoconazole (NIZORAL) 2 % cream      APPLY TO AFFECTED AREA EVERY DAY AS NEEDED   30 g   0   . LORazepam (ATIVAN) 1 MG  tablet      One half-1 every 8-12 hours as needed only for stress   20 tablet   1   . metoprolol succinate (TOPROL-XL) 50 MG 24 hr tablet      TAKE HALF TABLET BY MOUTH DAILY   45 tablet   1   . mometasone (NASONEX) 50 MCG/ACT nasal spray   Nasal   Place 2 sprays into the nose daily.   17 g   12   . zoster vaccine live, PF, (ZOSTAVAX) 40981 UNT/0.65ML injection   Subcutaneous   Inject 19,400 Units into the skin once.   1 each   0     BP 152/82  Pulse 62  Temp(Src) 98.7 F (37.1 C) (Oral)  Resp 18  SpO2 98%  Physical Exam  Nursing note and vitals reviewed. Constitutional: She is oriented to person, place, and time. She appears well-developed and well-nourished. No distress.  HENT:  Head: Normocephalic and atraumatic.  Eyes: EOM are normal.  Neck: Normal range of motion.  Cardiovascular: Normal rate, regular rhythm and normal heart sounds.   Pulmonary/Chest: Effort normal and breath sounds normal.  Abdominal: Soft. She exhibits no distension. There is no tenderness.  Musculoskeletal: Normal range of motion.  Neurological: She is alert and oriented to person, place, and time.  Skin: Skin is warm and dry.  Psychiatric: She has a normal mood and affect. Judgment normal.    ED Course  Procedures (including critical care time)  Date: 12/06/2012  Rate: 60  Rhythm: normal sinus rhythm  QRS Axis: normal  Intervals: normal  ST/T Wave abnormalities: normal  Conduction Disutrbances: none  Narrative Interpretation:   Old EKG Reviewed: No significant changes noted     Labs Reviewed  CBC - Abnormal; Notable for the following:    HCT 35.9 (*)    All other components within normal limits  BASIC METABOLIC PANEL - Abnormal; Notable  for the following:    Potassium 3.3 (*)    Glucose, Bld 110 (*)    GFR calc non Af Amer 57 (*)    GFR calc Af Amer 66 (*)    All other components within normal limits  TROPONIN I   Dg Chest Port 1 View  12/06/2012  *RADIOLOGY REPORT*   Clinical Data: Left-sided chest pain  PORTABLE CHEST - 1 VIEW  Comparison: Prior chest x-ray 11/29/2010  Findings: Lower inspiratory volumes results in mild crowding of the pulmonary vasculature.  There is mild pulmonary vascular congestion without overt edema.  Cardiac and mediastinal contours remain within normal limits.  No pneumothorax, focal consolidation or large pleural effusion.  No acute osseous abnormality.  IMPRESSION: Mild pulmonary vascular congestion without overt edema.   Original Report Authenticated By: Malachy Moan, M.D.    I personally reviewed the imaging tests through PACS system I reviewed available ER/hospitalization records through the EMR   1. Chest pain       MDM  Constant pain with a negative troponin normal EKG.  Doubt ACS.  Doubt pulmonary embolism.  Much of this sounds like it may be more musculoskeletal in nature after working in the yard.  She will be referred outpatient cardiology.  She understands return to the ER for new or worsening symptoms.         Lyanne Co, MD 12/06/12 [phone]

## 2012-12-06 NOTE — Telephone Encounter (Signed)
Noted per MD protocol. Ok to close encounter.

## 2012-12-06 NOTE — Telephone Encounter (Signed)
Patient Information:  Caller Name: Deborh  Phone: [phone]  Patient: [name]  Gender: Female  DOB: Feb 12, 1946  Age: 67 Years  PCP: Marga Melnick  Office Follow Up:  Does the office need to follow up with this patient?: No  Instructions For The Office: N/A   Symptoms  Reason For Call & Symptoms: Patient calling, had nausea that occured last night suddenly.  She was not able to vomit but did have a bm later.  Then developed a cold sweat and then chest pain, under the left breast.  Continues to have the intermittent chest pain.  She had difficulty describing how the pain feels "it is just there".  She did take a baby ASA last night.  Denies any other physical c/o's.  No shortness of breath.  Reviewed Health History In EMR: Yes  Reviewed Medications In EMR: Yes  Reviewed Allergies In EMR: Yes  Reviewed Surgeries / Procedures: Yes  Date of Onset of Symptoms: 12/05/2012  Treatments Tried: baby ASA  Treatments Tried Worked: No  Guideline(s) Used:  Chest Pain  Disposition Per Guideline:   Call EMS 911 Now  Reason For Disposition Reached:   Chest pain lasting longer than 5 minutes and ANY of the following:  Over 54 years old Over 62 years old and at least one cardiac risk factor (i.e., high blood pressure, diabetes, high cholesterol, obesity, smoker or strong family history of heart disease) Pain is crushing, pressure-like, or heavy  Took nitroglycerin and chest pain was not relieved History of heart disease (i.e., angina, heart attack, bypass surgery, angioplasty, CHF)  Advice Given:  N/A  Patient Refused Recommendation:  Patient Will Go To ED  Will have a neighbor drive her to the ED.

## 2012-12-10 ENCOUNTER — Encounter: Payer: Self-pay | Admitting: Internal Medicine

## 2012-12-10 ENCOUNTER — Ambulatory Visit (INDEPENDENT_AMBULATORY_CARE_PROVIDER_SITE_OTHER): Payer: Medicare Other | Admitting: Internal Medicine

## 2012-12-10 VITALS — BP 130/84 | HR 64 | Temp 97.6°F | Wt 174.0 lb

## 2012-12-10 DIAGNOSIS — R0789 Other chest pain: Secondary | ICD-10-CM | POA: Diagnosis not present

## 2012-12-10 DIAGNOSIS — E876 Hypokalemia: Secondary | ICD-10-CM

## 2012-12-10 DIAGNOSIS — D649 Anemia, unspecified: Secondary | ICD-10-CM | POA: Diagnosis not present

## 2012-12-10 LAB — CBC WITH DIFFERENTIAL/PLATELET
Basophils Absolute: 0 10*3/uL (ref 0.0–0.1)
HCT: 35.4 % — ABNORMAL LOW (ref 36.0–46.0)
Lymphs Abs: 1.8 10*3/uL (ref 0.7–4.0)
MCV: 86.6 fl (ref 78.0–100.0)
Monocytes Absolute: 0.4 10*3/uL (ref 0.1–1.0)
Monocytes Relative: 7.8 % (ref 3.0–12.0)
Platelets: 243 10*3/uL (ref 150.0–400.0)
RDW: 13 % (ref 11.5–14.6)

## 2012-12-10 LAB — BASIC METABOLIC PANEL
CO2: 26 mEq/L (ref 19–32)
Calcium: 9 mg/dL (ref 8.4–10.5)
Creatinine, Ser: 1 mg/dL (ref 0.4–1.2)
Glucose, Bld: 92 mg/dL (ref 70–99)

## 2012-12-10 LAB — IBC PANEL: Iron: 81 ug/dL (ref 42–145)

## 2012-12-10 NOTE — Progress Notes (Signed)
Subjective:    Patient ID: [name], female    DOB: Oct 11, 1945, 67 y.o.   MRN: [id number]  HPI   The emergency room notes 12/06/12 were reviewed. Her chest pain was atypical. She was not having chest pain with  high level of physical exercise she was doing in her yard but she did experience this later associated with nausea. She does describe profuse sweating while working outside.  Her potassium was 3.3; hematocrit 35.9.  EKG 01/02/12 was compared with the 12/06/12 EKG. Is essentially no change. There may be minor T. voltage changes in the V. leads. This may relate to the potassium.  She is on no medications which should cause low potassium.  Since 5/9 she's had intermittent chest discomfort at rest but less severe than that which occurred with exercise.  Remotely she had costochondritis after performing such activity.      Review of Systems She denies significant epistaxis, hemoptysis, hematuria, melena, or rectal bleeding.She has no unexplained weight loss; but she has chronic mid abdominal pain which has been evaluated in detail by Gyn. She has no abnormal bruising or bleeding. She has no difficulty stopping bleeding with injury.  She's also had some intermittent sensation that food is not going down well without frank dysphagia.  She describes increased stress related to family health issues and wonders if this is playing a role in her symptoms.     Objective:   Physical Exam Gen.: Healthy and well-nourished in appearance. Alert, appropriate and cooperative throughout exam. Eyes: No corneal or conjunctival inflammation noted. No icterus Mouth: Oral mucosa and oropharynx reveal no lesions or exudates. Teeth in good repair. Neck: No deformities, masses, or tenderness noted. Lungs: Normal respiratory effort; chest expands symmetrically. Lungs are clear to auscultation without rales, wheezes, or increased work of breathing.  Chest: There is no clinical costochondritis at this  time Heart: Normal rate and rhythm. Normal S1 and S2. No gallop, click, or rub. Grade 1/2 over 6 systolic murmur. Abdomen: Bowel sounds normal; abdomen soft and nontender. No masses, organomegaly or hernias noted. Musculoskeletal/extremities: No clubbing, cyanosis, edema, or significant extremity  deformity noted. Tone & strength  Normal. Joints normal . Nail health good. Able to lie down & sit up w/o help. Negative SLR bilaterally Vascular: Carotid, radial artery, dorsalis pedis and  posterior tibial pulses are full and equal. No bruits present. Neurologic: Alert and oriented x3.       Skin: Intact without suspicious lesions or rashes. Lymph: No cervical, axillary lymphadenopathy present. Psych: Mood and affect are normal. Normally interactive                                                                                        Assessment & Plan:  #1 atypical chest pain; she is continuing to have the discomfort intermittently at rest. The minor T changes may be related to #2  #2 hypokalemia  #3 mild anemia. This is in the context of possible dysphagia variant.  Plan: I do not recommend continuing exercise at this level without cardiology assessment.  Gatorade-Lite will be recommended along with other measures to maintain potassium.  Anemia workup will  be pursued. This could be related to silent reflux , gastritis& esophagitis.

## 2012-12-10 NOTE — Patient Instructions (Addendum)
Drink Gatorade Lite instead of water if having excessive sweating. This will prevent loss of essential  Electrolytes. Anemia is present; please complete stool cards ,B12 & folate levels will be checked.  Please review the record and make any corrections; share this with all medical staff seen.

## 2012-12-16 DIAGNOSIS — Z79899 Other long term (current) drug therapy: Secondary | ICD-10-CM | POA: Diagnosis not present

## 2012-12-16 DIAGNOSIS — M3500 Sicca syndrome, unspecified: Secondary | ICD-10-CM | POA: Diagnosis not present

## 2012-12-16 DIAGNOSIS — Z1589 Genetic susceptibility to other disease: Secondary | ICD-10-CM | POA: Diagnosis not present

## 2012-12-16 DIAGNOSIS — M1990 Unspecified osteoarthritis, unspecified site: Secondary | ICD-10-CM | POA: Diagnosis not present

## 2012-12-16 DIAGNOSIS — M2550 Pain in unspecified joint: Secondary | ICD-10-CM | POA: Diagnosis not present

## 2012-12-16 DIAGNOSIS — G5600 Carpal tunnel syndrome, unspecified upper limb: Secondary | ICD-10-CM | POA: Diagnosis not present

## 2012-12-25 ENCOUNTER — Encounter: Payer: Medicare Other | Admitting: Internal Medicine

## 2013-01-03 ENCOUNTER — Ambulatory Visit (INDEPENDENT_AMBULATORY_CARE_PROVIDER_SITE_OTHER): Payer: Medicare Other | Admitting: Cardiovascular Disease

## 2013-01-03 ENCOUNTER — Encounter: Payer: Self-pay | Admitting: Cardiovascular Disease

## 2013-01-03 VITALS — BP 152/90 | HR 53 | Ht 62.5 in | Wt 171.1 lb

## 2013-01-03 DIAGNOSIS — R079 Chest pain, unspecified: Secondary | ICD-10-CM

## 2013-01-03 DIAGNOSIS — E782 Mixed hyperlipidemia: Secondary | ICD-10-CM | POA: Diagnosis not present

## 2013-01-03 DIAGNOSIS — I10 Essential (primary) hypertension: Secondary | ICD-10-CM

## 2013-01-03 NOTE — Assessment & Plan Note (Signed)
Cholesterol is at goal.  Continue current dose of statin and diet Rx.  No myalgias or side effects.  F/U  LFT's in 6 months. Lab Results  Component Value Date   LDLCALC 112* 07/23/2006

## 2013-01-03 NOTE — Addendum Note (Signed)
**Note De-Identified [place] Obfuscation** Addended by: Demetrios Loll on: 01/03/2013 09:58 AM   Modules accepted: Orders

## 2013-01-03 NOTE — Progress Notes (Signed)
Patient ID: [name], female   DOB: 1945-10-22, 67 y.o.   MRN: [id number] Patient seen in ER 5/9.  Patient reports developing left-sided chest discomfort described as an ache which began several hours after working in the yard most of 5/8  Had one episode of nausea and dry heaves. No diaphoresis. No significant shortness of breath. No history of cardiac disease. She does have a history of hypertension and questionable hyperlipidemia. She does not smoke cigarettes. No family history of heart disease. The patient's pain and discomfort lasted most of the evening and she woke with ongoing pain all 5/9. Her pain was worse when she lays on her stomach last night when sleeping. No pain with range of motion of her arms. She denies new lower commit edema. No new orthopnea. She continues to sleep on one pillow. Primary care sent her to ER  R/O  CXR with ? Mild vascular congestion.  ECG no acute changes but abnormal  CRF;s HTN and elevated lipids    ROS: Denies fever, malais, weight loss, blurry vision, decreased visual acuity, cough, sputum, SOB, hemoptysis, pleuritic pain, palpitaitons, heartburn, abdominal pain, melena, lower extremity edema, claudication, or rash.  All other systems reviewed and negative   General: Affect appropriate Healthy:  appears stated age HEENT: normal Neck supple with no adenopathy JVP normal no bruits no thyromegaly Lungs clear with no wheezing and good diaphragmatic motion Heart:  S1/S2 no murmur,rub, gallop or click PMI normal Abdomen: benighn, BS positve, no tenderness, no AAA no bruit.  No HSM or HJR Distal pulses intact with no bruits No edema Neuro non-focal Skin warm and dry No muscular weakness  Medications Current Outpatient Prescriptions  Medication Sig Dispense Refill  . aspirin 81 MG tablet Take 81 mg by mouth daily.        . cetirizine (ZYRTEC) 10 MG tablet Take 10 mg by mouth daily.        . cycloSPORINE (RESTASIS) 0.05 % ophthalmic emulsion Place 1  drop into both eyes 2 (two) times daily.       Marland Kitchen doxycycline (MONODOX) 50 MG capsule Take 50 mg by mouth daily.      . furosemide (LASIX) 40 MG tablet TAKE HALF TABLET BY MOUTH DAILY  45 tablet  1  . ibuprofen (ADVIL,MOTRIN) 600 MG tablet Take 1 tablet (600 mg total) by mouth every 8 (eight) hours as needed for pain.  15 tablet  0  . ketoconazole (NIZORAL) 2 % cream APPLY TO AFFECTED AREA EVERY DAY AS NEEDED  30 g  0  . metoprolol succinate (TOPROL-XL) 50 MG 24 hr tablet TAKE HALF TABLET BY MOUTH DAILY  45 tablet  1  . mometasone (NASONEX) 50 MCG/ACT nasal spray Place 2 sprays into the nose daily.  17 g  12   No current facility-administered medications for this visit.    Allergies Nitroglycerin; Pentosan polysulfate sodium; Pentosan polysulfate sodium; Promethazine hcl; Sulfa antibiotics; Lisinopril; and Sulfamethoxazole w-trimethoprim  Family History: Family History  Problem Relation Age of Onset  . Coronary artery disease Mother   . Hypertension Mother   . Asthma Mother   . Osteoporosis Mother   . Dementia Mother   . Stroke Mother     mini cva  . Diabetes Father   . Cancer Sister     colon  . Cancer Paternal Aunt     2 aunts had renal cancer  . Coronary artery disease Maternal Grandfather   . Cirrhosis Father     non alcoholic  .  Cancer Maternal Aunt     breast  . Diabetes Paternal Aunt   . Diabetes Paternal Grandmother     Social History: History   Social History  . Marital Status: Married    Spouse Name: N/A    Number of Children: 1  . Years of Education: N/A   Occupational History  . retired    Social History Main Topics  . Smoking status: Never Smoker   . Smokeless tobacco: Never Used  . Alcohol Use: No  . Drug Use: No  . Sexually Active: Not on file   Other Topics Concern  . Not on file   Social History Narrative   Married          Electrocardiogram:  5/9  SR rate 60 poor R wave progression lateral T wave changes   Assessment and  Plan

## 2013-01-03 NOTE — Patient Instructions (Addendum)
Your physician has requested that you have en exercise stress myoview. For further information please visit [email or web address] Please follow instruction sheet, as given.  Your physician recommends that you schedule a follow-up appointment in: as needed, we will contact you with results of test.

## 2013-01-03 NOTE — Assessment & Plan Note (Signed)
Some white coat component K low in ER And Dr Alwyn Ren may change to K sparing diuretic

## 2013-01-03 NOTE — Assessment & Plan Note (Signed)
HTN with abnormal ECG and chest pain  F/U stress myovue.  Continue ASA

## 2013-01-07 ENCOUNTER — Encounter: Payer: Self-pay | Admitting: Cardiovascular Disease

## 2013-01-08 ENCOUNTER — Ambulatory Visit (HOSPITAL_COMMUNITY): Payer: Medicare Other | Attending: Cardiology | Admitting: Radiology

## 2013-01-08 VITALS — BP 148/77 | Ht 62.5 in | Wt 170.0 lb

## 2013-01-08 DIAGNOSIS — R5381 Other malaise: Secondary | ICD-10-CM | POA: Insufficient documentation

## 2013-01-08 DIAGNOSIS — R079 Chest pain, unspecified: Secondary | ICD-10-CM | POA: Diagnosis not present

## 2013-01-08 DIAGNOSIS — R002 Palpitations: Secondary | ICD-10-CM | POA: Insufficient documentation

## 2013-01-08 DIAGNOSIS — R0609 Other forms of dyspnea: Secondary | ICD-10-CM | POA: Insufficient documentation

## 2013-01-08 DIAGNOSIS — R0602 Shortness of breath: Secondary | ICD-10-CM | POA: Diagnosis not present

## 2013-01-08 DIAGNOSIS — R5383 Other fatigue: Secondary | ICD-10-CM | POA: Insufficient documentation

## 2013-01-08 DIAGNOSIS — R0989 Other specified symptoms and signs involving the circulatory and respiratory systems: Secondary | ICD-10-CM | POA: Insufficient documentation

## 2013-01-08 DIAGNOSIS — I10 Essential (primary) hypertension: Secondary | ICD-10-CM | POA: Diagnosis not present

## 2013-01-08 DIAGNOSIS — E785 Hyperlipidemia, unspecified: Secondary | ICD-10-CM | POA: Diagnosis not present

## 2013-01-08 DIAGNOSIS — R110 Nausea: Secondary | ICD-10-CM | POA: Diagnosis not present

## 2013-01-08 DIAGNOSIS — Z8249 Family history of ischemic heart disease and other diseases of the circulatory system: Secondary | ICD-10-CM | POA: Insufficient documentation

## 2013-01-08 DIAGNOSIS — R61 Generalized hyperhidrosis: Secondary | ICD-10-CM | POA: Insufficient documentation

## 2013-01-08 DIAGNOSIS — R0789 Other chest pain: Secondary | ICD-10-CM | POA: Diagnosis not present

## 2013-01-08 MED ORDER — TECHNETIUM TC 99M SESTAMIBI GENERIC - CARDIOLITE
10.0000 | Freq: Once | INTRAVENOUS | Status: AC | PRN
  Administered 2013-01-08: 10 via INTRAVENOUS

## 2013-01-08 MED ORDER — TECHNETIUM TC 99M SESTAMIBI GENERIC - CARDIOLITE
30.0000 | Freq: Once | INTRAVENOUS | Status: AC | PRN
  Administered 2013-01-08: 30 via INTRAVENOUS

## 2013-01-08 NOTE — Progress Notes (Signed)
[facility] SITE 3 NUCLEAR MED [address] [place], [place] [address] [phone]    Cardiology Nuclear Med Study  [name] is a 67 y.o. female     MRN : [id number]     DOB: 03/18/1946  Procedure Date: 01/08/2013  Nuclear Med Background Indication for Stress Test:  Evaluation for Ischemia and [facility] ED- 12/06/12 CP,Nausea;Enzymes negative History:  No prior known history of CAD, 10-2002 Myocardial Perfusion Study-poor exercise tolerance,no ischemia or ischemia, EF=73%,  Cardiac Risk Factors: Family History - CAD, Hypertension and Lipids  Symptoms: Chest Pain and Chest Pressure with/without exertion (last occurrence 4 to 5 days ago),  Diaphoresis, DOE, Fatigue, Nausea and Palpitations   Nuclear Pre-Procedure Caffeine/Decaff Intake:  None NPO After: 8:00pm   Lungs:  clear O2 Sat: 97%% on room air. IV 0.9% NS with Angio Cath:  22g  IV Site: R Antecubital  IV Started by:  Bonnita Levan, RN  Chest Size (in):  38 Cup Size: C  Height: 5' 2.5" (1.588 m)  Weight:  170 lb (77.111 kg)  BMI:  Body mass index is 30.58 kg/(m^2). Tech Comments:  Patient held Toprol x 24 hrs    Nuclear Med Study 1 or 2 day study: 1 day  Stress Test Type:  Stress  Reading MD: Willa Rough, MD  Order Authorizing Provider:  Charlton Haws, MD  Resting Radionuclide: Technetium 90m Sestamibi  Resting Radionuclide Dose: 11.0 mCi   Stress Radionuclide:  Technetium 56m Sestamibi  Stress Radionuclide Dose: 33.0 mCi           Stress Protocol Rest HR: 50 Stress HR: 131  Rest BP: 148/77 Stress BP: 188/75  Exercise Time (min): 5:01 METS: 7.0   Predicted Max HR: 152 bpm % Max HR: 86.18 bpm Rate Pressure Product: 38756   Dose of Adenosine (mg):  n/a Dose of Lexiscan: n/a mg  Dose of Atropine (mg): n/a Dose of Dobutamine: n/a mcg/kg/min (at max HR)  Stress Test Technologist: Irean Hong, RN  Nuclear Technologist:  Domenic Polite, CNMT     Rest Procedure:  Myocardial perfusion imaging was  performed at rest 45 minutes following the intravenous administration of Technetium 57m Sestamibi. Rest ECG: sinus bradycardia with nonspecific ST-T wave changes.  Stress Procedure:  The patient exercised on the treadmill utilizing the Bruce Protocol for 5:01 minutes, RPE=17. The patient stopped due to DOE and chest pain 2/10,second duration.  Technetium 64m Sestamibi was injected at peak exercise and myocardial perfusion imaging was performed after a brief delay. Stress ECG: No significant change from baseline ECG  QPS Raw Data Images:  Normal; no motion artifact; normal heart/lung ratio. Stress Images:  Normal homogeneous uptake in all areas of the myocardium. Rest Images:  Normal homogeneous uptake in all areas of the myocardium. Subtraction (SDS):  No evidence of ischemia. Transient Ischemic Dilatation (Normal <1.22):  1.03 Lung/Heart Ratio (Normal <0.45):  0.19  Quantitative Gated Spect Images QGS EDV:  69 ml QGS ESV:  21 ml  Impression Exercise Capacity:  There is a limited exercise capacity.  BP Response:  Normal blood pressure response. Clinical Symptoms:  Patient had shortness of breath with exercise. There was some chest discomfort in recovery. O2 sat was 95% in stage I and 94% in stage II of the treadmill. ECG Impression:  No significant ST segment change suggestive of ischemia. Comparison with Prior Nuclear Study: there is no change in the images since 2004  Overall Impression:  Normal stress nuclear study. There is no  scar or ischemia. The patient has limited exercise capacity.  LV Ejection Fraction: 70%.  LV Wall Motion:  Normal Wall Motion.  Willa Rough, MD

## 2013-01-13 ENCOUNTER — Encounter: Payer: Self-pay | Admitting: Cardiovascular Disease

## 2013-01-13 DIAGNOSIS — H2510 Age-related nuclear cataract, unspecified eye: Secondary | ICD-10-CM | POA: Diagnosis not present

## 2013-01-13 DIAGNOSIS — H04129 Dry eye syndrome of unspecified lacrimal gland: Secondary | ICD-10-CM | POA: Diagnosis not present

## 2013-01-14 ENCOUNTER — Telehealth: Payer: Self-pay | Admitting: Internal Medicine

## 2013-01-14 NOTE — Telephone Encounter (Signed)
Patient called stating she has sent stool cards twice and she would like to know if they have been received.

## 2013-01-14 NOTE — Telephone Encounter (Signed)
Per Sheena stool cards received.  Left message to call office to inform Pt

## 2013-01-15 ENCOUNTER — Other Ambulatory Visit (INDEPENDENT_AMBULATORY_CARE_PROVIDER_SITE_OTHER): Payer: Medicare Other

## 2013-01-15 DIAGNOSIS — Z1289 Encounter for screening for malignant neoplasm of other sites: Secondary | ICD-10-CM | POA: Diagnosis not present

## 2013-01-15 DIAGNOSIS — Z0000 Encounter for general adult medical examination without abnormal findings: Secondary | ICD-10-CM | POA: Diagnosis not present

## 2013-01-15 LAB — HEMOCCULT GUIAC POC 1CARD (OFFICE)

## 2013-01-15 NOTE — Telephone Encounter (Signed)
I spoke with Charlean Sanfilippo (lab tech)- report is being processed now, all slide negative. I called patient and left message informing her report was negative.

## 2013-01-16 ENCOUNTER — Telehealth: Payer: Self-pay | Admitting: Cardiovascular Disease

## 2013-01-16 NOTE — Telephone Encounter (Signed)
PT AWARE OF MYOVIEW RESULTS./CY 

## 2013-01-16 NOTE — Telephone Encounter (Signed)
Follow UP   Pt calling in following up on test results. Please call.

## 2013-01-22 ENCOUNTER — Telehealth: Payer: Self-pay | Admitting: Cardiovascular Disease

## 2013-01-22 NOTE — Telephone Encounter (Signed)
Pt Signed ROI,Stress Test from 2014 & 2004 Mailed to Pt Home Address  01/22/13/KM

## 2013-03-05 ENCOUNTER — Encounter: Payer: Self-pay | Admitting: Gastroenterology

## 2013-03-14 ENCOUNTER — Encounter: Payer: Self-pay | Admitting: Gastroenterology

## 2013-03-28 ENCOUNTER — Other Ambulatory Visit: Payer: Self-pay | Admitting: Internal Medicine

## 2013-03-28 NOTE — Telephone Encounter (Signed)
Rx sent to the pharmacy by e-script.//AB/CMA 

## 2013-04-07 ENCOUNTER — Other Ambulatory Visit: Payer: Self-pay

## 2013-04-07 DIAGNOSIS — Z1231 Encounter for screening mammogram for malignant neoplasm of breast: Secondary | ICD-10-CM

## 2013-05-01 ENCOUNTER — Other Ambulatory Visit (INDEPENDENT_AMBULATORY_CARE_PROVIDER_SITE_OTHER): Payer: Medicare Other | Admitting: *Deleted

## 2013-05-01 DIAGNOSIS — Z23 Encounter for immunization: Secondary | ICD-10-CM | POA: Diagnosis not present

## 2013-05-01 NOTE — Telephone Encounter (Signed)
Patient here with her spouse for appt to see provider and requested flu vaccine.

## 2013-05-15 ENCOUNTER — Other Ambulatory Visit: Payer: Self-pay | Admitting: Internal Medicine

## 2013-05-15 ENCOUNTER — Ambulatory Visit (HOSPITAL_BASED_OUTPATIENT_CLINIC_OR_DEPARTMENT_OTHER)
Admission: RE | Admit: 2013-05-15 | Discharge: 2013-05-15 | Disposition: A | Discharge status: Home / Self Care | Payer: Medicare Other | Source: Ambulatory Visit | Attending: Internal Medicine | Admitting: Internal Medicine

## 2013-05-15 ENCOUNTER — Encounter: Payer: Self-pay | Admitting: Internal Medicine

## 2013-05-15 ENCOUNTER — Ambulatory Visit (INDEPENDENT_AMBULATORY_CARE_PROVIDER_SITE_OTHER): Payer: Medicare Other | Admitting: Internal Medicine

## 2013-05-15 VITALS — BP 154/81 | HR 63 | Temp 98.2°F | Wt 169.8 lb

## 2013-05-15 DIAGNOSIS — R079 Chest pain, unspecified: Secondary | ICD-10-CM

## 2013-05-15 DIAGNOSIS — R918 Other nonspecific abnormal finding of lung field: Secondary | ICD-10-CM | POA: Diagnosis not present

## 2013-05-15 DIAGNOSIS — M79661 Pain in right lower leg: Secondary | ICD-10-CM

## 2013-05-15 DIAGNOSIS — I517 Cardiomegaly: Secondary | ICD-10-CM | POA: Insufficient documentation

## 2013-05-15 DIAGNOSIS — M79609 Pain in unspecified limb: Secondary | ICD-10-CM | POA: Insufficient documentation

## 2013-05-15 DIAGNOSIS — I10 Essential (primary) hypertension: Secondary | ICD-10-CM | POA: Diagnosis not present

## 2013-05-15 DIAGNOSIS — R5381 Other malaise: Secondary | ICD-10-CM | POA: Diagnosis not present

## 2013-05-15 DIAGNOSIS — J189 Pneumonia, unspecified organism: Secondary | ICD-10-CM | POA: Insufficient documentation

## 2013-05-15 DIAGNOSIS — F411 Generalized anxiety disorder: Secondary | ICD-10-CM | POA: Diagnosis not present

## 2013-05-15 LAB — BASIC METABOLIC PANEL
CO2: 28 mEq/L (ref 19–32)
Calcium: 9.8 mg/dL (ref 8.4–10.5)
Chloride: 102 mEq/L (ref 96–112)
Potassium: 3.8 mEq/L (ref 3.5–5.1)
Sodium: 140 mEq/L (ref 135–145)

## 2013-05-15 LAB — CBC WITH DIFFERENTIAL/PLATELET
Basophils Relative: 0.7 % (ref 0.0–3.0)
Eosinophils Absolute: 0.1 10*3/uL (ref 0.0–0.7)
Hemoglobin: 13.5 g/dL (ref 12.0–15.0)
Lymphocytes Relative: 32.2 % (ref 12.0–46.0)
MCHC: 34 g/dL (ref 30.0–36.0)
MCV: 87.2 fl (ref 78.0–100.0)
Neutro Abs: 3.8 10*3/uL (ref 1.4–7.7)
RBC: 4.56 Mil/uL (ref 3.87–5.11)

## 2013-05-15 LAB — D-DIMER, QUANTITATIVE: D-Dimer, Quant: 0.42 ug/mL-FEU (ref 0.00–0.48)

## 2013-05-15 MED ORDER — SERTRALINE HCL 50 MG PO TABS: ORAL_TABLET | ORAL | Status: DC

## 2013-05-15 MED ORDER — AZITHROMYCIN 250 MG PO TABS: ORAL_TABLET | ORAL | Status: DC

## 2013-05-15 NOTE — Patient Instructions (Signed)
Order for x-rays entered into  the computer; these will be performed at [facility]. No appointment is necessary.    

## 2013-05-15 NOTE — Progress Notes (Signed)
Subjective:    Patient ID: [name], female    DOB: 1946-06-27, 67 y.o.   MRN: [id number]  HPI Her symptom complex began 05/14/13 approximately 10 PM as a sharp pain in the right inferior calf while she was sitting. This lasted approximately 30 minutes. She took a baby aspirin at 10 PM and again at 11:15 PM.  After lying down she felt as if there were a weight on her chest without pressure. This was also associated with sweats. She has had sweats for months intermittently unlike prior hot flashes  Also last evening she had muscle weakness especially in the legs. She felt as if they would buckle were she to stand.  She is having blurred vision which is unrelated. She has an appointment to see Dr. Nile Riggs 10/21. She's also had some difficulty swallowing intermittently.  There's a question of excess snoring. She had a sleep apnea evaluation scheduled in 2013 but this was postponed because of her husbands health issues  She does admit to anxiety and stress as she is the primary caregiver for her husband who has had a complicated course since he had CNS surgery.    Review of Systems  She has not had chest pain per se, dyspnea, or hemoptysis. She's also had no syncope.  There has been no change in heart rate, edema, or paroxysmal nocturnal dyspnea.  She's noted no change in hair, skin or nails except fungal toenail changes. She also has no abnormal bruising or bleeding or an enlarged lymph nodes.  No melena or rectal bleeding     Objective:   Physical Exam Gen.: Healthy and well-nourished in appearance. Alert, appropriate and cooperative throughout exam.  Head: Normocephalic without obvious abnormalities Eyes: No corneal or conjunctival inflammation noted. Pupils equal round reactive to light and accommodation.  Extraocular motion intact. Vertical nystagmus  Nose: External nasal exam reveals no deformity or inflammation. Nasal mucosa are pink and moist. No lesions or exudates  noted.  Mouth: Oral mucosa and oropharynx reveal no lesions or exudates. Teeth in good repair. Neck: No deformities, masses, or tenderness noted. Range of motion & Thyroid normal Lungs: Normal respiratory effort; chest expands symmetrically. Lungs are clear to auscultation without rales, wheezes, or increased work of breathing. Heart: Normal rate and rhythm. Normal S1 and S2. No gallop, click, or rub.S3 w/o murmur. Abdomen: Bowel sounds normal; abdomen soft and nontender. No masses, organomegaly or hernias noted.                          Musculoskeletal/extremities:  No clubbing, cyanosis, edema, or significant extremity  deformity noted. Range of motion normal .Tone & strength  Normal. Joints normal . Nail health good. Able to lie down & sit up w/o help. Homan's ? + R calfVascular: Carotid, radial artery, dorsalis pedis and  posterior tibial pulses are full and equal. No bruits present. Neurologic: Alert and oriented x3. Deep tendon reflexes symmetrical and normal.          Skin: Intact without suspicious lesions or rashes. Lymph: No cervical, axillary lymphadenopathy present. Psych: Mood and affect are normal. Normally interactive  Assessment & Plan:  #1 right calf pain  #2 atypical chest pain; no ischemic EKG changes  #3 fatigue  #4 vertical nystagmus  Plan: See orders

## 2013-05-19 ENCOUNTER — Ambulatory Visit
Admission: RE | Admit: 2013-05-19 | Discharge: 2013-05-19 | Disposition: A | Discharge status: Home / Self Care | Payer: Medicare Other | Source: Ambulatory Visit

## 2013-05-19 DIAGNOSIS — Z1231 Encounter for screening mammogram for malignant neoplasm of breast: Secondary | ICD-10-CM | POA: Diagnosis not present

## 2013-05-19 DIAGNOSIS — H04129 Dry eye syndrome of unspecified lacrimal gland: Secondary | ICD-10-CM | POA: Diagnosis not present

## 2013-05-26 ENCOUNTER — Ambulatory Visit (HOSPITAL_BASED_OUTPATIENT_CLINIC_OR_DEPARTMENT_OTHER)
Admission: RE | Admit: 2013-05-26 | Discharge: 2013-05-26 | Disposition: A | Discharge status: Home / Self Care | Payer: Medicare Other | Source: Ambulatory Visit | Attending: Internal Medicine | Admitting: Internal Medicine

## 2013-05-26 DIAGNOSIS — J189 Pneumonia, unspecified organism: Secondary | ICD-10-CM | POA: Diagnosis not present

## 2013-05-26 DIAGNOSIS — I10 Essential (primary) hypertension: Secondary | ICD-10-CM | POA: Insufficient documentation

## 2013-06-02 ENCOUNTER — Other Ambulatory Visit: Payer: Self-pay | Admitting: Internal Medicine

## 2013-06-02 DIAGNOSIS — M1990 Unspecified osteoarthritis, unspecified site: Secondary | ICD-10-CM | POA: Diagnosis not present

## 2013-06-02 DIAGNOSIS — M2550 Pain in unspecified joint: Secondary | ICD-10-CM | POA: Diagnosis not present

## 2013-06-02 DIAGNOSIS — R894 Abnormal immunological findings in specimens from other organs, systems and tissues: Secondary | ICD-10-CM | POA: Diagnosis not present

## 2013-06-02 DIAGNOSIS — Z1589 Genetic susceptibility to other disease: Secondary | ICD-10-CM | POA: Diagnosis not present

## 2013-06-02 NOTE — Telephone Encounter (Signed)
Nasonex refill sent to pharmacy

## 2013-06-11 ENCOUNTER — Other Ambulatory Visit: Payer: Self-pay | Admitting: Internal Medicine

## 2013-06-11 NOTE — Telephone Encounter (Signed)
Furosemide refilled. 

## 2013-06-13 ENCOUNTER — Other Ambulatory Visit: Payer: Medicare Other | Admitting: Gastroenterology

## 2013-06-16 DIAGNOSIS — G5600 Carpal tunnel syndrome, unspecified upper limb: Secondary | ICD-10-CM | POA: Diagnosis not present

## 2013-06-19 ENCOUNTER — Ambulatory Visit (AMBULATORY_SURGERY_CENTER): Payer: Medicare Other | Admitting: *Deleted

## 2013-06-19 VITALS — Ht 62.5 in | Wt 174.4 lb

## 2013-06-19 DIAGNOSIS — Z86010 Personal history of colonic polyps: Secondary | ICD-10-CM

## 2013-06-19 MED ORDER — MOVIPREP 100 G PO SOLR: ORAL | Status: DC

## 2013-06-19 NOTE — Progress Notes (Signed)
No allergies to eggs or soy. No problems with anesthesia.  

## 2013-07-01 ENCOUNTER — Other Ambulatory Visit: Payer: Self-pay | Admitting: Orthopedic Surgery

## 2013-07-04 ENCOUNTER — Encounter: Payer: Self-pay | Admitting: Gastroenterology

## 2013-07-04 ENCOUNTER — Ambulatory Visit (AMBULATORY_SURGERY_CENTER): Payer: Medicare Other | Admitting: Gastroenterology

## 2013-07-04 VITALS — BP 141/64 | HR 51 | Temp 97.3°F | Resp 17 | Ht 62.0 in | Wt 174.0 lb

## 2013-07-04 DIAGNOSIS — Z86010 Personal history of colonic polyps: Secondary | ICD-10-CM

## 2013-07-04 DIAGNOSIS — Z800 Family history of malignant neoplasm of digestive organs: Secondary | ICD-10-CM | POA: Diagnosis not present

## 2013-07-04 DIAGNOSIS — K5730 Diverticulosis of large intestine without perforation or abscess without bleeding: Secondary | ICD-10-CM

## 2013-07-04 DIAGNOSIS — D126 Benign neoplasm of colon, unspecified: Secondary | ICD-10-CM

## 2013-07-04 MED ORDER — SODIUM CHLORIDE 0.9 % IV SOLN: 500.0000 mL | INTRAVENOUS | Status: DC

## 2013-07-04 NOTE — Progress Notes (Signed)
Patient did not experience any of the following events: a burn prior to discharge; a fall within the facility; wrong site/side/patient/procedure/implant event; or a hospital transfer or hospital admission upon discharge from the facility. (G8907) Patient did not have preoperative order for IV antibiotic SSI prophylaxis. (G8918)  

## 2013-07-04 NOTE — Op Note (Signed)
[facility] [address] [place], [address]   COLONOSCOPY PROCEDURE REPORT  PATIENT: [name], [name].  MR#: [id number] BIRTHDATE: 11/23/1945 , 67  yrs. old GENDER: Female ENDOSCOPIST: Rachael Fee, MD PROCEDURE DATE:  07/04/2013 PROCEDURE:   Colonoscopy with snare polypectomy First Screening Colonoscopy - Avg.  risk and is 50 yrs.  old or older - No.  Prior Negative Screening - Now for repeat screening. N/A  History of Adenoma - Now for follow-up colonoscopy & has been > or = to 3 yrs.  Yes hx of adenoma.  Has been 3 or more years since last colonoscopy.  Polyps Removed Today? Yes. ASA CLASS:   Class II INDICATIONS:adenomatous polyps removed 2009, also sister had colon cancer. MEDICATIONS: Fentanyl 50 mcg IV, Versed 7 mg IV, and These medications were titrated to patient response per physician's verbal order DESCRIPTION OF PROCEDURE:   After the risks benefits and alternatives of the procedure were thoroughly explained, informed consent was obtained.  A digital rectal exam revealed no abnormalities of the rectum.   The LB [id number]-H190 [id number]  endoscope was introduced through the anus and advanced to the cecum, which was identified by both the appendix and ileocecal valve. No adverse events experienced.   The quality of the prep was excellent.  The instrument was then slowly withdrawn as the colon was fully examined.  COLON FINDINGS: Nine polyps were found, removed and all were sent to pathology.  These were all sessile, located throughout the colon (ascending, transverse, descending and sigmoid), measuring 3-57mm across, all removed with cold snare.  There were a few diverticulum in the left colon.  The examination was otherwise normal. Retroflexed views revealed no abnormalities. The time to cecum=4 minutes 30 seconds.  Withdrawal time=13 minutes 56 seconds.  The scope was withdrawn and the procedure completed. COMPLICATIONS: There were no  complications.  ENDOSCOPIC IMPRESSION: Nine polyps were found, removed and all were sent to pathology. There were a few diverticulum in the left colon. The examination was otherwise normal.  RECOMMENDATIONS: If the polyp(s) removed today are proven to be adenomatous (pre-cancerous) polyps, you will need a colonoscopy in 3 years. You will receive a letter within 1-2 weeks with the results of your biopsy as well as final recommendations.  Please call my office if you have not received a letter after 3 weeks.   eSigned:  Rachael Fee, MD 07/04/2013 9:23 AM   cc: Marga Melnick, MD

## 2013-07-04 NOTE — Patient Instructions (Signed)
YOU HAD AN ENDOSCOPIC PROCEDURE TODAY AT THE [facility]: Refer to the procedure report that was given to you for any specific questions about what was found during the examination.  If the procedure report does not answer your questions, please call your gastroenterologist to clarify.  If you requested that your care partner not be given the details of your procedure findings, then the procedure report has been included in a sealed envelope for you to review at your convenience later.  YOU SHOULD EXPECT: Some feelings of bloating in the abdomen. Passage of more gas than usual.  Walking can help get rid of the air that was put into your GI tract during the procedure and reduce the bloating. If you had a lower endoscopy (such as a colonoscopy or flexible sigmoidoscopy) you may notice spotting of blood in your stool or on the toilet paper. If you underwent a bowel prep for your procedure, then you may not have a normal bowel movement for a few days.  DIET: Your first meal following the procedure should be a light meal and then it is ok to progress to your normal diet.  A half-sandwich or bowl of soup is an example of a good first meal.  Heavy or fried foods are harder to digest and may make you feel nauseous or bloated.  Likewise meals heavy in dairy and vegetables can cause extra gas to form and this can also increase the bloating.  Drink plenty of fluids but you should avoid alcoholic beverages for 24 hours.  ACTIVITY: Your care partner should take you home directly after the procedure.  You should plan to take it easy, moving slowly for the rest of the day.  You can resume normal activity the day after the procedure however you should NOT DRIVE or use heavy machinery for 24 hours (because of the sedation medicines used during the test).    SYMPTOMS TO REPORT IMMEDIATELY: A gastroenterologist can be reached at any hour.  During normal business hours, 8:30 AM to 5:00 PM Monday through Friday,  call [phone].  After hours and on weekends, please call the GI answering service at [phone] who will take a message and have the physician on call contact you.   Following lower endoscopy (colonoscopy or flexible sigmoidoscopy):  Excessive amounts of blood in the stool  Significant tenderness or worsening of abdominal pains  Swelling of the abdomen that is new, acute  Fever of 100F or higher  FOLLOW UP: If any biopsies were taken you will be contacted by phone or by letter within the next 1-3 weeks.  Call your gastroenterologist if you have not heard about the biopsies in 3 weeks.  Our staff will call the home number listed on your records the next business day following your procedure to check on you and address any questions or concerns that you may have at that time regarding the information given to you following your procedure. This is a courtesy call and so if there is no answer at the home number and we have not heard from you through the emergency physician on call, we will assume that you have returned to your regular daily activities without incident.  SIGNATURES/CONFIDENTIALITY: You and/or your care partner have signed paperwork which will be entered into your electronic medical record.  These signatures attest to the fact that that the information above on your After Visit Summary has been reviewed and is understood.  Full responsibility of the confidentiality of this   discharge information lies with you and/or your care-partner.   Information on polyps & diverticulosis given to you today    

## 2013-07-07 ENCOUNTER — Telehealth: Payer: Self-pay

## 2013-07-07 NOTE — Telephone Encounter (Signed)
  Follow up Call-  Call back number 07/04/2013  Post procedure Call Back phone  # [phone]  Permission to leave phone message Yes     Patient questions:  Do you have a fever, pain , or abdominal swelling? no Pain Score  0 *  Have you tolerated food without any problems? yes  Have you been able to return to your normal activities? yes  Do you have any questions about your discharge instructions: Diet   no Medications  no Follow up visit  no  Do you have questions or concerns about your Care? no  Actions: * If pain score is 4 or above: No action needed, pain <4.

## 2013-07-08 ENCOUNTER — Encounter (HOSPITAL_BASED_OUTPATIENT_CLINIC_OR_DEPARTMENT_OTHER): Payer: Self-pay | Admitting: *Deleted

## 2013-07-08 ENCOUNTER — Encounter (HOSPITAL_BASED_OUTPATIENT_CLINIC_OR_DEPARTMENT_OTHER)
Admission: RE | Admit: 2013-07-08 | Discharge: 2013-07-08 | Disposition: A | Discharge status: Home / Self Care | Payer: Medicare Other | Source: Ambulatory Visit | Attending: Orthopedic Surgery | Admitting: Orthopedic Surgery

## 2013-07-08 DIAGNOSIS — Z01812 Encounter for preprocedural laboratory examination: Secondary | ICD-10-CM | POA: Insufficient documentation

## 2013-07-08 DIAGNOSIS — Z01818 Encounter for other preprocedural examination: Secondary | ICD-10-CM | POA: Insufficient documentation

## 2013-07-08 LAB — BASIC METABOLIC PANEL
BUN: 12 mg/dL (ref 6–23)
CO2: 29 mEq/L (ref 19–32)
Calcium: 9.4 mg/dL (ref 8.4–10.5)
Chloride: 104 mEq/L (ref 96–112)
Creatinine, Ser: 0.9 mg/dL (ref 0.50–1.10)
GFR calc Af Amer: 74 mL/min — ABNORMAL LOW (ref >90)
GFR calc non Af Amer: 64 mL/min — ABNORMAL LOW (ref >90)
Glucose, Bld: 114 mg/dL — ABNORMAL HIGH (ref 70–99)
Potassium: 3.1 mEq/L — ABNORMAL LOW (ref 3.5–5.1)
Sodium: 143 mEq/L (ref 135–145)

## 2013-07-08 NOTE — Progress Notes (Signed)
Pt will come for bmet-she has been caring for husband at home post cva She is supposed to have a sleep study-her pcp is aware-not done yet

## 2013-07-09 ENCOUNTER — Other Ambulatory Visit: Payer: Self-pay | Admitting: *Deleted

## 2013-07-09 MED ORDER — MOMETASONE FUROATE 50 MCG/ACT NA SUSP: NASAL | Status: DC

## 2013-07-09 MED ORDER — METOPROLOL SUCCINATE ER 50 MG PO TB24: ORAL_TABLET | ORAL | Status: DC

## 2013-07-09 MED ORDER — FUROSEMIDE 40 MG PO TABS: ORAL_TABLET | ORAL | Status: DC

## 2013-07-10 ENCOUNTER — Ambulatory Visit (HOSPITAL_BASED_OUTPATIENT_CLINIC_OR_DEPARTMENT_OTHER): Admission: RE | Admit: 2013-07-10 | Payer: Medicare Other | Source: Ambulatory Visit | Admitting: Orthopedic Surgery

## 2013-07-10 SURGERY — CARPAL TUNNEL RELEASE
Anesthesia: General | Laterality: Left

## 2013-07-14 ENCOUNTER — Encounter: Payer: Self-pay | Admitting: Gastroenterology

## 2013-07-16 ENCOUNTER — Telehealth: Payer: Self-pay | Admitting: Internal Medicine

## 2013-07-16 ENCOUNTER — Other Ambulatory Visit: Payer: Self-pay | Admitting: *Deleted

## 2013-07-16 MED ORDER — MOMETASONE FUROATE 50 MCG/ACT NA SUSP: NASAL | Status: DC

## 2013-07-16 MED ORDER — FUROSEMIDE 40 MG PO TABS: ORAL_TABLET | ORAL | Status: DC

## 2013-07-16 MED ORDER — METOPROLOL SUCCINATE ER 50 MG PO TB24: ORAL_TABLET | ORAL | Status: DC

## 2013-07-16 NOTE — Telephone Encounter (Signed)
Patient called and stated that her prescriptions were suppose to [facility].  1)Metoprolol 2)Furosemide 3)Nasonex

## 2013-07-16 NOTE — Telephone Encounter (Signed)
Medication refills sent to [facility]. JG//CMA

## 2013-08-14 ENCOUNTER — Emergency Department (HOSPITAL_BASED_OUTPATIENT_CLINIC_OR_DEPARTMENT_OTHER): Payer: Medicare Other

## 2013-08-14 ENCOUNTER — Emergency Department (HOSPITAL_BASED_OUTPATIENT_CLINIC_OR_DEPARTMENT_OTHER)
Admission: EM | Admit: 2013-08-14 | Discharge: 2013-08-14 | Disposition: A | Discharge status: Home / Self Care | Payer: Medicare Other | Attending: Emergency Medicine | Admitting: Emergency Medicine

## 2013-08-14 ENCOUNTER — Encounter (HOSPITAL_BASED_OUTPATIENT_CLINIC_OR_DEPARTMENT_OTHER): Payer: Self-pay | Admitting: Emergency Medicine

## 2013-08-14 DIAGNOSIS — N201 Calculus of ureter: Secondary | ICD-10-CM | POA: Diagnosis not present

## 2013-08-14 DIAGNOSIS — IMO0002 Reserved for concepts with insufficient information to code with codable children: Secondary | ICD-10-CM | POA: Diagnosis not present

## 2013-08-14 DIAGNOSIS — Z860100 Personal history of colon polyps, unspecified: Secondary | ICD-10-CM | POA: Insufficient documentation

## 2013-08-14 DIAGNOSIS — Z8669 Personal history of other diseases of the nervous system and sense organs: Secondary | ICD-10-CM | POA: Insufficient documentation

## 2013-08-14 DIAGNOSIS — Z8719 Personal history of other diseases of the digestive system: Secondary | ICD-10-CM | POA: Insufficient documentation

## 2013-08-14 DIAGNOSIS — I10 Essential (primary) hypertension: Secondary | ICD-10-CM | POA: Insufficient documentation

## 2013-08-14 DIAGNOSIS — Z862 Personal history of diseases of the blood and blood-forming organs and certain disorders involving the immune mechanism: Secondary | ICD-10-CM | POA: Diagnosis not present

## 2013-08-14 DIAGNOSIS — N200 Calculus of kidney: Secondary | ICD-10-CM | POA: Diagnosis not present

## 2013-08-14 DIAGNOSIS — M1990 Unspecified osteoarthritis, unspecified site: Secondary | ICD-10-CM | POA: Insufficient documentation

## 2013-08-14 DIAGNOSIS — Z7982 Long term (current) use of aspirin: Secondary | ICD-10-CM | POA: Diagnosis not present

## 2013-08-14 DIAGNOSIS — Z8639 Personal history of other endocrine, nutritional and metabolic disease: Secondary | ICD-10-CM | POA: Insufficient documentation

## 2013-08-14 DIAGNOSIS — F411 Generalized anxiety disorder: Secondary | ICD-10-CM | POA: Insufficient documentation

## 2013-08-14 DIAGNOSIS — R300 Dysuria: Secondary | ICD-10-CM | POA: Insufficient documentation

## 2013-08-14 DIAGNOSIS — Z79899 Other long term (current) drug therapy: Secondary | ICD-10-CM | POA: Insufficient documentation

## 2013-08-14 LAB — URINE MICROSCOPIC-ADD ON

## 2013-08-14 LAB — URINALYSIS, ROUTINE W REFLEX MICROSCOPIC
Bilirubin Urine: NEGATIVE
GLUCOSE, UA: NEGATIVE mg/dL
KETONES UR: NEGATIVE mg/dL
Nitrite: NEGATIVE
PROTEIN: NEGATIVE mg/dL
Specific Gravity, Urine: 1.018 (ref 1.005–1.030)
UROBILINOGEN UA: 0.2 mg/dL (ref 0.0–1.0)
pH: 6 (ref 5.0–8.0)

## 2013-08-14 MED ORDER — TAMSULOSIN HCL 0.4 MG PO CAPS: 0.4000 mg | ORAL_CAPSULE | Freq: Every day | ORAL | Status: DC

## 2013-08-14 MED ORDER — HYDROCODONE-ACETAMINOPHEN 5-325 MG PO TABS: 1.0000 | ORAL_TABLET | ORAL | Status: DC | PRN

## 2013-08-14 MED ORDER — TAMSULOSIN HCL 0.4 MG PO CAPS
0.4000 mg | ORAL_CAPSULE | Freq: Once | ORAL | Status: AC
  Administered 2013-08-14: 0.4 mg via ORAL
  Filled 2013-08-14: qty 1

## 2013-08-14 MED ORDER — SODIUM CHLORIDE 0.9 % IV SOLN
INTRAVENOUS | Status: DC
  Administered 2013-08-14: 08:00:00 via INTRAVENOUS

## 2013-08-14 MED ORDER — NAPROXEN 250 MG PO TABS
500.0000 mg | ORAL_TABLET | Freq: Once | ORAL | Status: AC
  Administered 2013-08-14: 500 mg via ORAL
  Filled 2013-08-14: qty 2

## 2013-08-14 MED ORDER — NAPROXEN 500 MG PO TABS: 500.0000 mg | ORAL_TABLET | Freq: Two times a day (BID) | ORAL | Status: DC

## 2013-08-14 MED ORDER — ONDANSETRON HCL 4 MG PO TABS: 4.0000 mg | ORAL_TABLET | Freq: Four times a day (QID) | ORAL | Status: DC

## 2013-08-14 NOTE — ED Provider Notes (Signed)
CSN: [id number]     Arrival date & time 08/14/13  4196 History   First MD Initiated Contact with Patient 08/14/13 [phone]     Chief Complaint  Patient presents with  . Flank Pain   (Consider location/radiation/quality/duration/timing/severity/associated sxs/prior Treatment) HPI This is a 68 year old female with a two-day history of episodic left flank pain. The episodes typically last about 30 minutes. There is no trigger for them. Nothing makes the pain better or worse. Specifically the pain does not change with movement, palpation or breathing. She has been having some very mild burning with urination. She denies fever, chills, nausea, vomiting or hematuria.  Past Medical History  Diagnosis Date  . Hypertension   . Allergy     seasonal  . GERD (gastroesophageal reflux disease)   . Hyperlipidemia     NMR 2005  . Osteopenia     [facility]  . Hx of colonic polyps   . Diverticulosis of colon   . Interstitial cystitis   . Vitamin D deficiency   . Xerostomia     and Xeroophthalmia  . Arthritis   . Dry eye syndrome   . Osteoarthritis   . Cystitis   . Incontinence   . Ankylosing spondylitis   . HLA B27 (HLA B27 positive)   . Frozen shoulder   . Bell palsy   . Anxiety    Past Surgical History  Procedure Laterality Date  . Total abdominal hysterectomy w/ bilateral salpingoophorectomy      dysfunctional menses  . Bladder surgery      for incontinence  . G 1 p 1    . Colonscopy      tics 10-2002  . Interstitial cystitis      with clot in catheter  . Elevated lfts      due to [place]  . Colonoscopy w/ polypectomy      adenomatous poyp 04-2008  . Finger surgery Left 2004    2nd finger   Family History  Problem Relation Age of Onset  . Coronary artery disease Mother   . Hypertension Mother   . Asthma Mother   . Osteoporosis Mother   . Dementia Mother   . Stroke Mother     mini cva  . Diabetes Father   . Cirrhosis Father     non alcoholic  .  Cancer Sister     colon  . Colon cancer Sister 78  . Cancer Paternal Aunt     2 aunts had renal cancer  . Coronary artery disease Maternal Grandfather   . Cancer Maternal Aunt     breast  . Diabetes Paternal Aunt   . Diabetes Paternal Grandmother    History  Substance Use Topics  . Smoking status: Never Smoker   . Smokeless tobacco: Never Used  . Alcohol Use: No   OB History   Grav Para Term Preterm Abortions TAB SAB Ect Mult Living                 Review of Systems  All other systems reviewed and are negative.    Allergies  Nitroglycerin; Pentosan polysulfate sodium; Promethazine hcl; Lisinopril; and Sulfamethoxazole-trimethoprim  Home Medications   Current Outpatient Rx  Name  Route  Sig  Dispense  Refill  . aspirin 81 MG tablet   Oral   Take 81 mg by mouth daily.           . cetirizine (ZYRTEC) 10 MG tablet   Oral  Take 10 mg by mouth daily.           . Cholecalciferol (VITAMIN D-3) 5000 UNITS TABS   Oral   Take by mouth daily.         . cycloSPORINE (RESTASIS) 0.05 % ophthalmic emulsion   Both Eyes   Place 1 drop into both eyes 2 (two) times daily.          . furosemide (LASIX) 40 MG tablet      Take 1/2 tablet by mouth   every day   45 tablet   1   . ibuprofen (ADVIL,MOTRIN) 600 MG tablet   Oral   Take 1 tablet (600 mg total) by mouth every 8 (eight) hours as needed for pain.   15 tablet   0   . ketoconazole (NIZORAL) 2 % cream      APPLY TO AFFECTED AREA EVERY DAY AS NEEDED   30 g   0   . metoprolol succinate (TOPROL-XL) 50 MG 24 hr tablet      Take half tablet by mouth daily   45 tablet   1   . mometasone (NASONEX) 50 MCG/ACT nasal spray      Use 2 sprays each nostril daily   17 g   11   . Polyethyl Glycol-Propyl Glycol (SYSTANE OP)   Ophthalmic   Apply to eye as needed.         . vitamin E 400 UNIT capsule   Oral   Take 400 Units by mouth daily.          BP 131/76  Pulse 61  Temp(Src) 98.1 F (36.7 C)  (Oral)  Resp 19  SpO2 99%  Physical Exam General: Well-developed, well-nourished female in no acute distress; appearance consistent with age of record HENT: normocephalic; atraumatic Eyes: pupils equal, round and reactive to light; extraocular muscles intact Neck: supple Heart: regular rate and rhythm Lungs: clear to auscultation bilaterally Abdomen: soft; nondistended; nontender; no masses or hepatosplenomegaly; bowel sounds present GU: No CVA tenderness Extremities: No deformity; full range of motion; pulses normal Neurologic: Awake, alert and oriented; motor function intact in all extremities and symmetric; no facial droop Skin: Warm and dry Psychiatric: Normal mood and affect    ED Course  Procedures (including critical care time)    MDM   Nursing notes and vitals signs, including pulse oximetry, reviewed.  Summary of this visit's results, reviewed by myself:  Labs:  Results for orders placed during the hospital encounter of 08/14/13 (from the past 24 hour(s))  URINALYSIS, ROUTINE W REFLEX MICROSCOPIC     Status: Abnormal   Collection Time    08/14/13  7:22 AM      Result Value Range   Color, Urine YELLOW  YELLOW   APPearance CLOUDY (*) CLEAR   Specific Gravity, Urine 1.018  1.005 - 1.030   pH 6.0  5.0 - 8.0   Glucose, UA NEGATIVE  NEGATIVE mg/dL   Hgb urine dipstick LARGE (*) NEGATIVE   Bilirubin Urine NEGATIVE  NEGATIVE   Ketones, ur NEGATIVE  NEGATIVE mg/dL   Protein, ur NEGATIVE  NEGATIVE mg/dL   Urobilinogen, UA 0.2  0.0 - 1.0 mg/dL   Nitrite NEGATIVE  NEGATIVE   Leukocytes, UA SMALL (*) NEGATIVE  URINE MICROSCOPIC-ADD ON     Status: Abnormal   Collection Time    08/14/13  7:22 AM      Result Value Range   Squamous Epithelial / LPF FEW (*) RARE   WBC,  UA 3-6  <3 WBC/hpf   RBC / HPF TOO NUMEROUS TO COUNT  <3 RBC/hpf   Bacteria, UA MANY (*) RARE    7:58 AM Dr. Leonides Schanz to follow up on CT results and make disposition.      Wynetta Fines,  MD 08/14/13 [phone]

## 2013-08-14 NOTE — Discharge Instructions (Signed)
Kidney Stones Kidney stones (urolithiasis) are deposits that form inside your kidneys. The intense pain is caused by the stone moving through the urinary tract. When the stone moves, the ureter goes into spasm around the stone. The stone is usually passed in the urine.  CAUSES   A disorder that makes certain neck glands produce too much parathyroid hormone (primary hyperparathyroidism).  A buildup of uric acid crystals, similar to gout in your joints.  Narrowing (stricture) of the ureter.  A kidney obstruction present at birth (congenital obstruction).  Previous surgery on the kidney or ureters.  Numerous kidney infections. SYMPTOMS   Feeling sick to your stomach (nauseous).  Throwing up (vomiting).  Blood in the urine (hematuria).  Pain that usually spreads (radiates) to the groin.  Frequency or urgency of urination. DIAGNOSIS   Taking a history and physical exam.  Blood or urine tests.  CT scan.  Occasionally, an examination of the inside of the urinary bladder (cystoscopy) is performed. TREATMENT   Observation.  Increasing your fluid intake.  Extracorporeal shock wave lithotripsy This is a noninvasive procedure that uses shock waves to break up kidney stones.  Surgery may be needed if you have severe pain or persistent obstruction. There are various surgical procedures. Most of the procedures are performed with the use of small instruments. Only small incisions are needed to accommodate these instruments, so recovery time is minimized. The size, location, and chemical composition are all important variables that will determine the proper choice of action for you. Talk to your health care provider to better understand your situation so that you will minimize the risk of injury to yourself and your kidney.  HOME CARE INSTRUCTIONS   Drink enough water and fluids to keep your urine clear or pale yellow. This will help you to pass the stone or stone fragments.  Strain  all urine through the provided strainer. Keep all particulate matter and stones for your health care provider to see. The stone causing the pain may be as small as a grain of salt. It is very important to use the strainer each and every time you pass your urine. The collection of your stone will allow your health care provider to analyze it and verify that a stone has actually passed. The stone analysis will often identify what you can do to reduce the incidence of recurrences.  Only take over-the-counter or prescription medicines for pain, discomfort, or fever as directed by your health care provider.  Make a follow-up appointment with your health care provider as directed.  Get follow-up X-rays if required. The absence of pain does not always mean that the stone has passed. It may have only stopped moving. If the urine remains completely obstructed, it can cause loss of kidney function or even complete destruction of the kidney. It is your responsibility to make sure X-rays and follow-ups are completed. Ultrasounds of the kidney can show blockages and the status of the kidney. Ultrasounds are not associated with any radiation and can be performed easily in a matter of minutes. SEEK MEDICAL CARE IF:  You experience pain that is progressive and unresponsive to any pain medicine you have been prescribed. SEEK IMMEDIATE MEDICAL CARE IF:   Pain cannot be controlled with the prescribed medicine.  You have a fever or shaking chills.  The severity or intensity of pain increases over 18 hours and is not relieved by pain medicine.  You develop a new onset of abdominal pain.  You feel faint or pass  out.  You are unable to urinate. MAKE SURE YOU:   Understand these instructions.  Will watch your condition.  Will get help right away if you are not doing well or get worse. Document Released: 07/17/2005 Document Revised: 03/19/2013 Document Reviewed: 12/18/2012 [facility] Patient Information 2014  [place].  Diet for Kidney Stones Kidney stones are small, hard masses that form inside your kidneys. They are made up of salts and minerals and often form when high levels build up in the urine. The minerals can then start to build up, crystalize, and stick together to form stones. There are several different types of kidney stones. The following types of stones may be influenced by dietary factors:   Calcium Oxalate Stones. An oxalate is a salt found in certain foods. Within the body, calcium can combine with oxalates to form calcium oxalate stones, which can be excreted in the urine in high amounts. This is the most common type of kidney stone.  Calcium Phosphate Stones. These stones may occur when the pH of the urine becomes too high, or less acidic, from too much calcium being excreted in the urine. The pH is a measure of how acidic or basic a substance is.  Uric Acid Stones. This type of stone occurs when the pH of the urine becomes too low, or very acidic, because substances called purines build up in the urine. Purines are found in animal proteins. When the urine is highly concentrated with acid, uric acid kidney stones can form.  Other risk factors for kidney stones include genetics, environment, and being overweight. Your caregiver may ask you to follow specific diet guidelines based on the type of stone you have to lessen the chances of your body making more kidney stones.  GENERAL GUIDELINES FOR ALL TYPES OF STONES  Drink plenty of fluid. Drink 12 16 cups of fluid a day, drinking mainly water.This is the most important thing you can do to prevent the formation of future kidney stones.  Maintain a healthy weight. Your caregiver or dietitian can help you determine what a healthy weight is for you. If you are overweight, weight loss may help prevent the formation of future kidney stones.  Eat a diet adequate in animal protein. Too much animal protein can contribute to the formation  of stones. Your dietitian can help you determine how much protein you should be eating. Avoid low carbohydrate, high protein diets.  Follow a balanced eating approach. The DASH diet, which stands for "Dietary Approaches to Stop Hypertension," is an effective meal plan for reducing stone formation. This diet is high in fruits, vegetables, dairy, and whole grains and low in animal protein. Ask your caregiver or dietitian for information about the DASH diet. ADDITIONAL DIET GUIDELINES FOR CALCIUM STONES Avoid foods high in salt. This includes table salt, salt seasonings, MSG, soy sauce, cured and processed meats, salted crackers and snack foods, fast food, and canned soups and foods. Ask your caregiver or dietitian for information about reducing sodium in your diet or following the low sodium diet.  Ensure adequate calcium intake. Use the following table for calcium guidelines:  Men 81 years old and younger  1000 mg/day.  Men 46 years old and older  1500 mg/day.  Women 51 70 years old  1000 mg/day.  Women 50 years and older  1500 mg/day. Your dietitian can help you determine if you are getting enough calcium in your diet. Foods that are high in calcium include dairy products, broccoli, cheese, yogurt, and  pudding. If you need to take a calcium supplement, take it only in the form of calcium citrate.  Avoid foods high in oxalate. Be sure that any supplements you take do not contain more than 500 mg of vitamin C. Vitamin C is converted into oxalate in the body. You do not need to avoid fruits and vegetables high in vitamin C.   Grains: High-fiber or bran cereal, whole-wheat bread, grits, barley, buckwheat, amaranth, pretzels, and fruitcake.  Vegetables: Dried beans, wax beans, dark leafy greens, eggplant, leeks, okra, parsley, rutabaga, tomato paste, watercress, zucchini, and escarole.  Fruit: Dried apricots, red currants, figs, kiwi, and rhubarb.  Meat and Meat Substitutes: Soybeans and foods made  from soy (soyburger, miso), dried beans, peanut butter.  Milk: Chocolate milk mixes and soymilk.  Fats and Oils: Nuts (peanuts, almonds, pecans, cashews, hazelnuts) and nut butters, sesame seeds, and tDahini paste.  Condiments/Miscellaneous: Chocolate, carob, marmalade, poppy seeds, instant iced tea, and juice from high-oxalate fruits.  Document Released: 11/11/2010 Document Revised: 01/16/2012 Document Reviewed: 01/01/2012 [facility] Patient Information 2014 [place].  Urine Strainer This strainer is used to catch or filter out any stones found in your urine. Place the strainer under your urine stream. Save any stones or objects that you find in your urine. Place them in a plastic or glass container to show your caregiver. The stones vary in size - some can be very small, so make sure you check the strainer carefully. Your caregiver may send the stone to the lab. When the results are back, your caregiver may recommend medicines or diet changes.  Document Released: 04/21/2004 Document Revised: 10/09/2011 Document Reviewed: 05/29/2008 [facility] Patient Information 2014 [place].

## 2013-08-14 NOTE — ED Notes (Addendum)
Patient states she has been experiencing L flank pain since Tuesday & pain when urinating, comes in waves that will last for approx 30 minutes

## 2013-08-14 NOTE — ED Provider Notes (Signed)
8:27 AM  Assumed care from Dr. Florina Ou.  Pt is a 68yo F with flank pain that started yesterday. She does have a history of interstitial cystitis. She has too numerous to count RBCs in her urine and small leukocytes but also many squamous cells. Suspect dirty catch rather than true infection. Her CT scan shows a 2 mm left kidney stone at the UVJ. Patient has had her pain controlled with naproxen in the emergency department. She is hemodynamically stable. We'll discharge home with urology followup, prescription for pain medication and anti-inflammatories, antiemetics, Flomax. Have given strict return precautions. Patient and husband at bedside verbalize understanding and are comfortable and pleased with plan.  [place], DO 08/14/13 [phone]

## 2013-08-15 LAB — URINE CULTURE
CULTURE: NO GROWTH
Colony Count: NO GROWTH

## 2013-08-19 DIAGNOSIS — L739 Follicular disorder, unspecified: Secondary | ICD-10-CM | POA: Diagnosis not present

## 2013-08-19 DIAGNOSIS — L821 Other seborrheic keratosis: Secondary | ICD-10-CM | POA: Diagnosis not present

## 2013-08-19 DIAGNOSIS — D239 Other benign neoplasm of skin, unspecified: Secondary | ICD-10-CM | POA: Diagnosis not present

## 2013-08-26 ENCOUNTER — Telehealth: Payer: Self-pay | Admitting: Gastroenterology

## 2013-08-26 NOTE — Telephone Encounter (Signed)
Pt had colonoscopy on 07/04/13 and has since had problems with bleeding hemorrhoids.  Pt has not tried any OTC hemorrhoid meds, she thought they would go away on their own.  Pt will try prep H and sitz baths, she was advised to keep the area clean and dry and avoid constipation.  She will call back by Friday if she has no relief.  She wanted to know if she should try a prescription suppository instead of the OTC prep H.  I will forward to Dr Ardis Hughs for review

## 2013-08-26 NOTE — Telephone Encounter (Signed)
Pt aware and will call on Friday if no better

## 2013-08-26 NOTE — Telephone Encounter (Signed)
Definitely trial of OTC first.

## 2013-09-01 ENCOUNTER — Encounter (HOSPITAL_BASED_OUTPATIENT_CLINIC_OR_DEPARTMENT_OTHER): Payer: Self-pay | Admitting: Emergency Medicine

## 2013-09-01 ENCOUNTER — Encounter (HOSPITAL_COMMUNITY): Payer: Medicare Other | Admitting: Registered Nurse

## 2013-09-01 ENCOUNTER — Emergency Department (HOSPITAL_COMMUNITY): Payer: Medicare Other | Admitting: Registered Nurse

## 2013-09-01 ENCOUNTER — Ambulatory Visit (HOSPITAL_BASED_OUTPATIENT_CLINIC_OR_DEPARTMENT_OTHER)
Admission: EM | Admit: 2013-09-01 | Discharge: 2013-09-01 | Disposition: A | Discharge status: Home / Self Care | Payer: Medicare Other | Attending: Urology | Admitting: Urology

## 2013-09-01 ENCOUNTER — Encounter (HOSPITAL_COMMUNITY)
Admission: EM | Disposition: A | Discharge status: Home / Self Care | Payer: Self-pay | Source: Home / Self Care | Attending: Emergency Medicine

## 2013-09-01 ENCOUNTER — Emergency Department (HOSPITAL_BASED_OUTPATIENT_CLINIC_OR_DEPARTMENT_OTHER): Payer: Medicare Other

## 2013-09-01 DIAGNOSIS — M949 Disorder of cartilage, unspecified: Secondary | ICD-10-CM | POA: Diagnosis not present

## 2013-09-01 DIAGNOSIS — E785 Hyperlipidemia, unspecified: Secondary | ICD-10-CM | POA: Insufficient documentation

## 2013-09-01 DIAGNOSIS — K219 Gastro-esophageal reflux disease without esophagitis: Secondary | ICD-10-CM | POA: Insufficient documentation

## 2013-09-01 DIAGNOSIS — M459 Ankylosing spondylitis of unspecified sites in spine: Secondary | ICD-10-CM | POA: Insufficient documentation

## 2013-09-01 DIAGNOSIS — K560 Paralytic ileus: Secondary | ICD-10-CM

## 2013-09-01 DIAGNOSIS — N201 Calculus of ureter: Secondary | ICD-10-CM | POA: Diagnosis not present

## 2013-09-01 DIAGNOSIS — M129 Arthropathy, unspecified: Secondary | ICD-10-CM | POA: Insufficient documentation

## 2013-09-01 DIAGNOSIS — Z888 Allergy status to other drugs, medicaments and biological substances status: Secondary | ICD-10-CM | POA: Insufficient documentation

## 2013-09-01 DIAGNOSIS — M7500 Adhesive capsulitis of unspecified shoulder: Secondary | ICD-10-CM | POA: Insufficient documentation

## 2013-09-01 DIAGNOSIS — I10 Essential (primary) hypertension: Secondary | ICD-10-CM | POA: Diagnosis not present

## 2013-09-01 DIAGNOSIS — N135 Crossing vessel and stricture of ureter without hydronephrosis: Secondary | ICD-10-CM | POA: Diagnosis not present

## 2013-09-01 DIAGNOSIS — E559 Vitamin D deficiency, unspecified: Secondary | ICD-10-CM | POA: Diagnosis not present

## 2013-09-01 DIAGNOSIS — N200 Calculus of kidney: Secondary | ICD-10-CM

## 2013-09-01 DIAGNOSIS — M899 Disorder of bone, unspecified: Secondary | ICD-10-CM | POA: Diagnosis not present

## 2013-09-01 DIAGNOSIS — D72829 Elevated white blood cell count, unspecified: Secondary | ICD-10-CM | POA: Diagnosis not present

## 2013-09-01 DIAGNOSIS — R509 Fever, unspecified: Secondary | ICD-10-CM | POA: Diagnosis not present

## 2013-09-01 DIAGNOSIS — Z87440 Personal history of urinary (tract) infections: Secondary | ICD-10-CM | POA: Diagnosis not present

## 2013-09-01 DIAGNOSIS — N390 Urinary tract infection, site not specified: Secondary | ICD-10-CM

## 2013-09-01 DIAGNOSIS — R109 Unspecified abdominal pain: Secondary | ICD-10-CM | POA: Diagnosis not present

## 2013-09-01 HISTORY — PX: CYSTOSCOPY WITH RETROGRADE PYELOGRAM, URETEROSCOPY AND STENT PLACEMENT: SHX5789

## 2013-09-01 LAB — URINALYSIS, ROUTINE W REFLEX MICROSCOPIC
Bilirubin Urine: NEGATIVE
Glucose, UA: NEGATIVE mg/dL
Ketones, ur: 15 mg/dL — AB
Nitrite: POSITIVE — AB
PROTEIN: 100 mg/dL — AB
Specific Gravity, Urine: 1.019 (ref 1.005–1.030)
Urobilinogen, UA: 1 mg/dL (ref 0.0–1.0)
pH: 7 (ref 5.0–8.0)

## 2013-09-01 LAB — CBC WITH DIFFERENTIAL/PLATELET
BASOS ABS: 0 10*3/uL (ref 0.0–0.1)
BASOS PCT: 0 % (ref 0–1)
EOS ABS: 0 10*3/uL (ref 0.0–0.7)
Eosinophils Relative: 0 % (ref 0–5)
HCT: 35.6 % — ABNORMAL LOW (ref 36.0–46.0)
Hemoglobin: 11.8 g/dL — ABNORMAL LOW (ref 12.0–15.0)
Lymphocytes Relative: 4 % — ABNORMAL LOW (ref 12–46)
Lymphs Abs: 0.4 10*3/uL — ABNORMAL LOW (ref 0.7–4.0)
MCH: 29.9 pg (ref 26.0–34.0)
MCHC: 33.1 g/dL (ref 30.0–36.0)
MCV: 90.1 fL (ref 78.0–100.0)
MONOS PCT: 4 % (ref 3–12)
Monocytes Absolute: 0.5 10*3/uL (ref 0.1–1.0)
NEUTROS PCT: 92 % — AB (ref 43–77)
Neutro Abs: 10.7 10*3/uL — ABNORMAL HIGH (ref 1.7–7.7)
PLATELETS: 164 10*3/uL (ref 150–400)
RBC: 3.95 MIL/uL (ref 3.87–5.11)
RDW: 12.7 % (ref 11.5–15.5)
WBC: 11.6 10*3/uL — ABNORMAL HIGH (ref 4.0–10.5)

## 2013-09-01 LAB — URINE MICROSCOPIC-ADD ON

## 2013-09-01 LAB — BASIC METABOLIC PANEL
BUN: 18 mg/dL (ref 6–23)
CALCIUM: 9.3 mg/dL (ref 8.4–10.5)
CO2: 23 mEq/L (ref 19–32)
Chloride: 104 mEq/L (ref 96–112)
Creatinine, Ser: 1.3 mg/dL — ABNORMAL HIGH (ref 0.50–1.10)
GFR calc Af Amer: 47 mL/min — ABNORMAL LOW (ref >90)
GFR, EST NON AFRICAN AMERICAN: 41 mL/min — AB (ref >90)
GLUCOSE: 161 mg/dL — AB (ref 70–99)
Potassium: 4.1 mEq/L (ref 3.7–5.3)
Sodium: 141 mEq/L (ref 137–147)

## 2013-09-01 SURGERY — CYSTOURETEROSCOPY, WITH RETROGRADE PYELOGRAM AND STENT INSERTION
Anesthesia: General | Site: Ureter | Laterality: Left

## 2013-09-01 MED ORDER — ONDANSETRON HCL 4 MG/2ML IJ SOLN
INTRAMUSCULAR | Status: AC
  Filled 2013-09-01: qty 2

## 2013-09-01 MED ORDER — LIDOCAINE HCL (CARDIAC) 20 MG/ML IV SOLN
INTRAVENOUS | Status: AC
  Filled 2013-09-01: qty 5

## 2013-09-01 MED ORDER — PROPOFOL 10 MG/ML IV BOLUS
INTRAVENOUS | Status: DC | PRN
  Administered 2013-09-01: 160 mg via INTRAVENOUS

## 2013-09-01 MED ORDER — BELLADONNA ALKALOIDS-OPIUM 16.2-60 MG RE SUPP
RECTAL | Status: DC | PRN
  Administered 2013-09-01: 1 via RECTAL

## 2013-09-01 MED ORDER — BELLADONNA ALKALOIDS-OPIUM 16.2-60 MG RE SUPP
RECTAL | Status: AC
  Filled 2013-09-01: qty 1

## 2013-09-01 MED ORDER — ONDANSETRON HCL 4 MG/2ML IJ SOLN
INTRAMUSCULAR | Status: DC | PRN
  Administered 2013-09-01: 4 mg via INTRAVENOUS

## 2013-09-01 MED ORDER — LIDOCAINE HCL 2 % EX GEL
CUTANEOUS | Status: DC | PRN
  Administered 2013-09-01: 1 via URETHRAL

## 2013-09-01 MED ORDER — ONDANSETRON HCL 4 MG/2ML IJ SOLN
4.0000 mg | Freq: Once | INTRAMUSCULAR | Status: AC
  Administered 2013-09-01: 4 mg via INTRAVENOUS
  Filled 2013-09-01: qty 2

## 2013-09-01 MED ORDER — CIPROFLOXACIN IN D5W 400 MG/200ML IV SOLN
INTRAVENOUS | Status: AC
  Filled 2013-09-01: qty 200

## 2013-09-01 MED ORDER — SODIUM CHLORIDE 0.9 % IR SOLN
Status: DC | PRN
  Administered 2013-09-01: 3000 mL

## 2013-09-01 MED ORDER — PHENAZOPYRIDINE HCL 100 MG PO TABS: 100.0000 mg | ORAL_TABLET | Freq: Three times a day (TID) | ORAL | Status: DC | PRN

## 2013-09-01 MED ORDER — LACTATED RINGERS IV SOLN
INTRAVENOUS | Status: DC | PRN
  Administered 2013-09-01: 15:00:00 via INTRAVENOUS

## 2013-09-01 MED ORDER — CIPROFLOXACIN HCL 500 MG PO TABS: 500.0000 mg | ORAL_TABLET | Freq: Two times a day (BID) | ORAL | Status: DC

## 2013-09-01 MED ORDER — CEFTRIAXONE SODIUM 1 G IJ SOLR
INTRAMUSCULAR | Status: AC
  Administered 2013-09-01: 11:00:00
  Filled 2013-09-01: qty 10

## 2013-09-01 MED ORDER — MIDAZOLAM HCL 5 MG/5ML IJ SOLN
INTRAMUSCULAR | Status: DC | PRN
  Administered 2013-09-01: 1 mg via INTRAVENOUS

## 2013-09-01 MED ORDER — LACTATED RINGERS IV SOLN
INTRAVENOUS | Status: DC
  Administered 2013-09-01: 17:00:00 via INTRAVENOUS

## 2013-09-01 MED ORDER — CIPROFLOXACIN IN D5W 400 MG/200ML IV SOLN
400.0000 mg | Freq: Once | INTRAVENOUS | Status: DC
  Filled 2013-09-01: qty 200

## 2013-09-01 MED ORDER — FENTANYL CITRATE 0.05 MG/ML IJ SOLN
INTRAMUSCULAR | Status: AC
  Filled 2013-09-01: qty 2

## 2013-09-01 MED ORDER — MORPHINE SULFATE 2 MG/ML IJ SOLN
1.0000 mg | INTRAMUSCULAR | Status: DC | PRN
  Administered 2013-09-01: 2 mg via INTRAVENOUS
  Filled 2013-09-01: qty 1

## 2013-09-01 MED ORDER — LIDOCAINE HCL 2 % EX GEL
CUTANEOUS | Status: AC
  Filled 2013-09-01: qty 10

## 2013-09-01 MED ORDER — SENNOSIDES-DOCUSATE SODIUM 8.6-50 MG PO TABS: 1.0000 | ORAL_TABLET | Freq: Two times a day (BID) | ORAL | Status: DC

## 2013-09-01 MED ORDER — MIDAZOLAM HCL 2 MG/2ML IJ SOLN
INTRAMUSCULAR | Status: AC
Start: 2013-09-01 — End: 2013-09-01
  Filled 2013-09-01: qty 2

## 2013-09-01 MED ORDER — PROPOFOL 10 MG/ML IV BOLUS
INTRAVENOUS | Status: AC
  Filled 2013-09-01: qty 20

## 2013-09-01 MED ORDER — OXYBUTYNIN CHLORIDE 5 MG PO TABS: 5.0000 mg | ORAL_TABLET | Freq: Four times a day (QID) | ORAL | Status: DC | PRN

## 2013-09-01 MED ORDER — DEXAMETHASONE SODIUM PHOSPHATE 10 MG/ML IJ SOLN
INTRAMUSCULAR | Status: DC | PRN
  Administered 2013-09-01: 10 mg via INTRAVENOUS

## 2013-09-01 MED ORDER — SODIUM CHLORIDE 0.9 % IV BOLUS (SEPSIS)
500.0000 mL | Freq: Once | INTRAVENOUS | Status: AC
  Administered 2013-09-01: 500 mL via INTRAVENOUS

## 2013-09-01 MED ORDER — DEXAMETHASONE SODIUM PHOSPHATE 10 MG/ML IJ SOLN
INTRAMUSCULAR | Status: AC
  Filled 2013-09-01: qty 1

## 2013-09-01 MED ORDER — LIDOCAINE HCL (CARDIAC) 20 MG/ML IV SOLN
INTRAVENOUS | Status: DC | PRN
  Administered 2013-09-01: 50 mg via INTRAVENOUS

## 2013-09-01 MED ORDER — FENTANYL CITRATE 0.05 MG/ML IJ SOLN: 25.0000 ug | INTRAMUSCULAR | Status: DC | PRN

## 2013-09-01 MED ORDER — DEXTROSE 5 % IV SOLN
1.0000 g | Freq: Once | INTRAVENOUS | Status: AC
  Administered 2013-09-01: 1 g via INTRAVENOUS

## 2013-09-01 MED ORDER — OXYCODONE-ACETAMINOPHEN 5-325 MG PO TABS: 1.0000 | ORAL_TABLET | ORAL | Status: DC | PRN

## 2013-09-01 MED ORDER — IOHEXOL 300 MG/ML  SOLN
INTRAMUSCULAR | Status: DC | PRN
  Administered 2013-09-01: 5 mL via URETHRAL

## 2013-09-01 MED ORDER — SODIUM CHLORIDE 0.9 % IR SOLN
Status: DC | PRN
  Administered 2013-09-01: 1000 mL

## 2013-09-01 MED ORDER — HYOSCYAMINE SULFATE 0.125 MG PO TABS: 0.1250 mg | ORAL_TABLET | ORAL | Status: DC | PRN

## 2013-09-01 MED ORDER — MIDAZOLAM HCL 2 MG/2ML IJ SOLN
INTRAMUSCULAR | Status: AC
  Filled 2013-09-01: qty 2

## 2013-09-01 SURGICAL SUPPLY — 26 items
BAG URO CATCHER STRL LF (DRAPE) IMPLANT
BASKET LASER NITINOL 1.9FR (BASKET) IMPLANT
BASKET STNLS GEMINI 4WIRE 3FR (BASKET) IMPLANT
BASKET ZERO TIP NITINOL 2.4FR (BASKET) IMPLANT
CATH CLEAR GEL 3F BACKSTOP (CATHETERS) IMPLANT
CATH URET 5FR 28IN CONE TIP (BALLOONS)
CATH URET 5FR 28IN OPEN ENDED (CATHETERS) ×2 IMPLANT
CATH URET 5FR 70CM CONE TIP (BALLOONS) IMPLANT
CATH URET DUAL LUMEN 6-10FR 50 (CATHETERS) IMPLANT
CLOTH BEACON ORANGE TIMEOUT ST (SAFETY) ×2 IMPLANT
DRAPE CAMERA CLOSED 9X96 (DRAPES) ×2 IMPLANT
FIBER LASER FLEXIVA 200 (UROLOGICAL SUPPLIES) IMPLANT
FIBER LASER FLEXIVA 365 (UROLOGICAL SUPPLIES) IMPLANT
GLOVE BIOGEL M 7.0 STRL (GLOVE) ×2 IMPLANT
GOWN STRL REUS W/TWL LRG LVL3 (GOWN DISPOSABLE) ×2 IMPLANT
GUIDEWIRE ANG ZIPWIRE 038X150 (WIRE) IMPLANT
GUIDEWIRE STR DUAL SENSOR (WIRE) ×2 IMPLANT
IV NS IRRIG 3000ML ARTHROMATIC (IV SOLUTION) ×2 IMPLANT
PACK CYSTO (CUSTOM PROCEDURE TRAY) ×2 IMPLANT
SCRUB PCMX 4 OZ (MISCELLANEOUS) ×2 IMPLANT
SHEATH ACCESS URETERAL 38CM (SHEATH) IMPLANT
SHEATH URET ACCESS 12FR/35CM (UROLOGICAL SUPPLIES) IMPLANT
SHEATH URET ACCESS 12FR/55CM (UROLOGICAL SUPPLIES) IMPLANT
STENT POLARIS 5FRX22 (STENTS) ×2 IMPLANT
SYRINGE IRR TOOMEY STRL 70CC (SYRINGE) IMPLANT
TUBING CONNECTING 10 (TUBING) ×2 IMPLANT

## 2013-09-01 NOTE — ED Notes (Signed)
Pt reports nausea that started this morning.  States that she woke up with 'the shakes'.  A/O x 4.  No distress noted.  Denies pain.

## 2013-09-01 NOTE — ED Provider Notes (Signed)
CSN: [id number]     Arrival date & time 09/01/13  0809 History   First MD Initiated Contact with Patient 09/01/13 [phone]     Chief Complaint  Patient presents with  . Nausea    HPI Patient awoke this morning with chills followed by temperature 102.  Felt nauseated.  Recently treated for kidney stone.  Urine culture done at that time was negative.  Patient has previous history of interstitial cystitis. Past Medical History  Diagnosis Date  . Hypertension   . Allergy     seasonal  . GERD (gastroesophageal reflux disease)   . Hyperlipidemia     NMR 2005  . Osteopenia     [facility]  . Hx of colonic polyps   . Diverticulosis of colon   . Interstitial cystitis   . Vitamin D deficiency   . Xerostomia     and Xeroophthalmia  . Arthritis   . Dry eye syndrome   . Osteoarthritis   . Cystitis   . Incontinence   . Ankylosing spondylitis   . HLA B27 (HLA B27 positive)   . Frozen shoulder   . Bell palsy   . Anxiety    Past Surgical History  Procedure Laterality Date  . Total abdominal hysterectomy w/ bilateral salpingoophorectomy      dysfunctional menses  . Bladder surgery      for incontinence  . G 1 p 1    . Colonscopy      tics 10-2002  . Interstitial cystitis      with clot in catheter  . Elevated lfts      due to [place]  . Colonoscopy w/ polypectomy      adenomatous poyp 04-2008  . Finger surgery Left 2004    2nd finger   Family History  Problem Relation Age of Onset  . Coronary artery disease Mother   . Hypertension Mother   . Asthma Mother   . Osteoporosis Mother   . Dementia Mother   . Stroke Mother     mini cva  . Diabetes Father   . Cirrhosis Father     non alcoholic  . Cancer Sister     colon  . Colon cancer Sister 28  . Cancer Paternal Aunt     2 aunts had renal cancer  . Coronary artery disease Maternal Grandfather   . Cancer Maternal Aunt     breast  . Diabetes Paternal Aunt   . Diabetes Paternal Grandmother    History   Substance Use Topics  . Smoking status: Never Smoker   . Smokeless tobacco: Never Used  . Alcohol Use: No   OB History   Grav Para Term Preterm Abortions TAB SAB Ect Mult Living                 Review of Systems  Constitutional: Positive for fever and chills.  All other systems reviewed and are negative.    Allergies  Nitroglycerin; Pentosan polysulfate sodium; Promethazine hcl; Lisinopril; and Sulfamethoxazole-trimethoprim  Home Medications   Current Outpatient Rx  Name  Route  Sig  Dispense  Refill  . aspirin 81 MG tablet   Oral   Take 81 mg by mouth daily.           . cetirizine (ZYRTEC) 10 MG tablet   Oral   Take 10 mg by mouth daily.           . Cholecalciferol (VITAMIN D-3) 5000 UNITS TABS  Oral   Take by mouth daily.         . cycloSPORINE (RESTASIS) 0.05 % ophthalmic emulsion   Both Eyes   Place 1 drop into both eyes 2 (two) times daily.          . furosemide (LASIX) 40 MG tablet      Take 1/2 tablet by mouth   every day   45 tablet   1   . HYDROcodone-acetaminophen (NORCO/VICODIN) 5-325 MG per tablet   Oral   Take 1 tablet by mouth every 4 (four) hours as needed.   15 tablet   0   . ibuprofen (ADVIL,MOTRIN) 600 MG tablet   Oral   Take 1 tablet (600 mg total) by mouth every 8 (eight) hours as needed for pain.   15 tablet   0   . ketoconazole (NIZORAL) 2 % cream      APPLY TO AFFECTED AREA EVERY DAY AS NEEDED   30 g   0   . metoprolol succinate (TOPROL-XL) 50 MG 24 hr tablet      Take half tablet by mouth daily   45 tablet   1   . mometasone (NASONEX) 50 MCG/ACT nasal spray      Use 2 sprays each nostril daily   17 g   11   . naproxen (NAPROSYN) 500 MG tablet   Oral   Take 1 tablet (500 mg total) by mouth 2 (two) times daily with a meal. As needed for pain   30 tablet   0   . ondansetron (ZOFRAN) 4 MG tablet   Oral   Take 1 tablet (4 mg total) by mouth every 6 (six) hours.   12 tablet   0   . Polyethyl  Glycol-Propyl Glycol (SYSTANE OP)   Ophthalmic   Apply to eye as needed.         . tamsulosin (FLOMAX) 0.4 MG CAPS capsule   Oral   Take 1 capsule (0.4 mg total) by mouth daily.   30 capsule   0   . vitamin E 400 UNIT capsule   Oral   Take 400 Units by mouth daily.          BP 114/55  Pulse 88  Temp(Src) 97.4 F (36.3 C) (Oral)  Resp 18  Ht 5' 2"  (1.575 m)  Wt 175 lb (79.379 kg)  BMI 32.00 kg/m2  SpO2 98% Physical Exam  Nursing note and vitals reviewed. Constitutional: She is oriented to person, place, and time. She appears well-developed and well-nourished. No distress.  HENT:  Head: Normocephalic and atraumatic.  Eyes: Pupils are equal, round, and reactive to light.  Neck: Normal range of motion.  Cardiovascular: Normal rate and intact distal pulses.   Pulmonary/Chest: No respiratory distress.  Abdominal: Normal appearance. She exhibits no distension. There is no tenderness. There is no rebound and no guarding.  Musculoskeletal: Normal range of motion.  Neurological: She is alert and oriented to person, place, and time. No cranial nerve deficit.  Skin: Skin is warm and dry. No rash noted.  Psychiatric: She has a normal mood and affect. Her behavior is normal.    ED Course  Procedures (including critical care time) Medications  cefTRIAXone (ROCEPHIN) 1 g in dextrose 5 % 50 mL IVPB (1 g Intravenous New Bag/Given 09/01/13 0956)  cefTRIAXone (ROCEPHIN) 1 G injection (not administered)  sodium chloride 0.9 % bolus 500 mL (500 mLs Intravenous New Bag/Given 09/01/13 0849)  ondansetron (ZOFRAN) injection 4 mg (4  mg Intravenous Given 09/01/13 0849)    Labs Review Labs Reviewed  BASIC METABOLIC PANEL - Abnormal; Notable for the following:    Glucose, Bld 161 (*)    Creatinine, Ser 1.30 (*)    GFR calc non Af Amer 41 (*)    GFR calc Af Amer 47 (*)    All other components within normal limits  CBC WITH DIFFERENTIAL - Abnormal; Notable for the following:    WBC 11.6 (*)     Hemoglobin 11.8 (*)    HCT 35.6 (*)    Neutrophils Relative % 92 (*)    Neutro Abs 10.7 (*)    Lymphocytes Relative 4 (*)    Lymphs Abs 0.4 (*)    All other components within normal limits  URINALYSIS, ROUTINE W REFLEX MICROSCOPIC - Abnormal; Notable for the following:    APPearance CLOUDY (*)    Hgb urine dipstick MODERATE (*)    Ketones, ur 15 (*)    Protein, ur 100 (*)    Nitrite POSITIVE (*)    Leukocytes, UA LARGE (*)    All other components within normal limits  URINE MICROSCOPIC-ADD ON - Abnormal; Notable for the following:    Squamous Epithelial / LPF FEW (*)    Bacteria, UA MANY (*)    All other components within normal limits  URINE CULTURE   Imaging Review Dg Abd 1 View  09/01/2013   CLINICAL DATA:  Abdominal pain .  EXAM: ABDOMEN - 1 VIEW  COMPARISON:  CT 08/14/2013.  FINDINGS: Several nondilated air-filled loops of small bowel are noted. The colonic gas pattern is nonspecific. This is nonspecific gas pattern, developing adynamic ileus cannot be excluded. No free air. The tiny calcific densities in the pelvis most consistent with phleboliths. Punctate calcification left lower pelvis could represent previously identified left UVJ stone.   IMPRESSION:  1. Cannot exclude moderate developing adynamic ileus.  2. Tiny calcific densities in pelvis most consistent with phleboliths. Tiny punctate calcification left lower pelvis could represent the previously identified tiny left UVJ stone .    Electronically Signed   By: Marcello Moores  Register   On: 09/01/2013 09:03      MDM   1. Urinary tract infection   2. Nephrolithiasis   3. Adynamic ileus     I discussed the case with Dr. Jasmine December [facility].  He wants patient to come to [facility] emergency room and he will be paged when she gets a period  Dot Lanes, MD 09/01/13 [phone]

## 2013-09-01 NOTE — ED Notes (Signed)
Dr Jasmine December at bedside

## 2013-09-01 NOTE — ED Notes (Signed)
Patient placed on [facility]. Patient SPO2 88-90% on RA.

## 2013-09-01 NOTE — ED Notes (Signed)
Patient requested to change into hospital gown.

## 2013-09-01 NOTE — Preoperative (Addendum)
Beta Blockers   Reason not to administer Beta Blockers:Took metoprolol 08/31/2013 in am. Will treat if needed intraop after induction of general anesthesia.

## 2013-09-01 NOTE — Anesthesia Procedure Notes (Signed)
Procedure Name: LMA Insertion Date/Time: 09/01/2013 3:44 PM Performed by: Ofilia Neas Pre-anesthesia Checklist: Patient identified, Patient being monitored, Timeout performed, Emergency Drugs available and Suction available Patient Re-evaluated:Patient Re-evaluated prior to inductionOxygen Delivery Method: Circle system utilized Preoxygenation: Pre-oxygenation with 100% oxygen Intubation Type: IV induction LMA: LMA with gastric port inserted LMA Size: 4.0 Number of attempts: 1 Placement Confirmation: positive ETCO2 Tube secured with: Tape Dental Injury: Teeth and Oropharynx as per pre-operative assessment

## 2013-09-01 NOTE — ED Notes (Signed)
Exact stop times on below listed medications are unknown. Patient arrived to our facility with SL PIV

## 2013-09-01 NOTE — ED Notes (Signed)
Patient arrived with family POV from [facility]. Patient to be seen by Dr Jasmine December, who was paged on patient arrival. Patient c/o lower bad pain 3/10. VSS at this time. PIV in place on arrival, flushed without difficulty. Patient laying bed, eyes closed, NAD noted.

## 2013-09-01 NOTE — Transfer of Care (Signed)
Immediate Anesthesia Transfer of Care Note  Patient: [name]  Procedure(s) Performed: Procedure(s): CYSTOSCOPY WITH RETROGRADE PYELOGRAM, AND LEFT STENT PLACEMENT (Left)  Patient Location: PACU  Anesthesia Type:General  Level of Consciousness: awake, oriented, patient cooperative, lethargic and responds to stimulation  Airway & Oxygen Therapy: Patient Spontanous Breathing and Patient connected to face mask oxygen  Post-op Assessment: Report given to PACU RN, Post -op Vital signs reviewed and stable and Patient moving all extremities  Post vital signs: Reviewed and stable  Complications: No apparent anesthesia complications

## 2013-09-01 NOTE — Anesthesia Preprocedure Evaluation (Addendum)
Anesthesia Evaluation  Patient identified by MRN, date of birth, ID band Patient awake    Reviewed: Allergy & Precautions, H&P , NPO status , Patient's Chart, lab work & pertinent test results, reviewed documented beta blocker date and time   Airway Mallampati: II TM Distance: >3 FB Neck ROM: full    Dental no notable dental hx. (+) Teeth Intact and Dental Advisory Given   Pulmonary neg pulmonary ROS,  breath sounds clear to auscultation  Pulmonary exam normal       Cardiovascular Exercise Tolerance: Good hypertension, Pt. on home beta blockers Rhythm:regular Rate:Normal     Neuro/Psych negative neurological ROS  negative psych ROS   GI/Hepatic negative GI ROS, Neg liver ROS, GERD-  Controlled,  Endo/Other  negative endocrine ROS  Renal/GU negative Renal ROS  negative genitourinary   Musculoskeletal   Abdominal   Peds  Hematology negative hematology ROS (+)   Anesthesia Other Findings   Reproductive/Obstetrics negative OB ROS                          Anesthesia Physical Anesthesia Plan  ASA: II  Anesthesia Plan: General   Post-op Pain Management:    Induction: Intravenous  Airway Management Planned: LMA  Additional Equipment:   Intra-op Plan:   Post-operative Plan:   Informed Consent: I have reviewed the patients History and Physical, chart, labs and discussed the procedure including the risks, benefits and alternatives for the proposed anesthesia with the patient or authorized representative who has indicated his/her understanding and acceptance.   Dental Advisory Given  Plan Discussed with: CRNA and Surgeon  Anesthesia Plan Comments:         Anesthesia Quick Evaluation

## 2013-09-01 NOTE — Op Note (Signed)
Urology Operative Report  Date of Procedure: 09/01/13  Surgeon: Rolan Bucco, MD Assistant:  None  Preoperative Diagnosis: Left ureter stone. Postoperative Diagnosis:  Same  Procedure(s): Cystoscopy Left ureteroscopy Left retrograde pyelogram with interpretation Left ureter stent placement (5 x 24 Polaris without tethering string)  Estimated blood loss: Minimal  Specimen: None  Drains: None  Complications: None  Findings: Negative bladder tumors. Negative filling defects in the ureter. Negative hydronephrosis or hydroureter the left side. There was return of debris with placement of a retrograde pyelogram which could represent a small stone. Negative stone or obstruction on left ureteroscopy.  History of present illness: Patient presents to the ER in chamber 2015 with left flank pain. She's not have a left distal ureter stone. She presented again today with fever and continued discomfort. She elected to proceed to the operating room for left ureter stent placement and possible left ureteroscopy for stone.   Procedure in detail: After informed consent was obtained, the patient was taken to the operating room. They were placed in the supine position. SCDs were turned on and in place. IV antibiotics were infused, and general anesthesia was induced. A timeout was performed in which the correct patient, surgical site, and procedure were identified and agreed upon by the team.  The patient was placed in a dorsolithotomy position, making sure to pad all pertinent neurovascular pressure points. The genitals were prepped and draped in the usual sterile fashion.  A rigid cystoscope was made through the urethra and into the bladder. The bladder was drained. It was then fully distended and evaluated in a systematic fashion with a 12 and 70 lens to visualize the entire surface of the bladder. This was negative for bladder tumors. Attention was turned the right ureter orifice. Clear urine  was draining from the side. Attention was turned the left ureter orifice. Clear urine was draining from the side.  I then performed a left retrograde pyelogram by cannulating the left ureter orifice with a 5 French ureter catheter and injecting 5 cc of Omnipaque. This was negative for filling defects, hydroureter, or hydronephrosis. This side also drained well. There was return of some small amount of debris which could have represented a smaller stone seen on CT scan. To be sure that there was no shucking stone I then elected to perform a left ureteroscopy.  I placed a sensor wire through the semirigid ureteroscope and placed this up the left ureter and into the left renal pelvis. I then guided the the rigid ureteroscope into the left distal ureter. There was no obstruction or stone noted. I then withdrew the ureter scope. I elected to leave a left ureter stent.  I loaded the sensor wire through the cystoscope and then placed a 5 x 24 Polaris stent without the tethering strings. This was deployed with a good curl in the left renal pelvis and the loops in the bladder.  The bladder was drained, I placed 10 cc of lidocaine jelly to the urethra, and a B. and O. suppository into the rectum. She's placed back in a supine position and anesthesia was reversed. She was taken to the [facility] in stable condition.    All counts were correct at the end of the case.  She'll be discharged home on 10 days of ciprofloxacin. She will followup with me next week for cystoscopy and stent removal.

## 2013-09-01 NOTE — ED Notes (Addendum)
Contacted [facility] spoke with Marcello Moores at [phone]  for transport to [place] er per Dr. Audie Pinto  Request. JD

## 2013-09-01 NOTE — ED Notes (Signed)
Family contact # for during procedure [name] (daughter) [phone]

## 2013-09-01 NOTE — ED Notes (Signed)
Requested Dr Jasmine December (urology) be paged as per orders from Dr Audie Pinto.

## 2013-09-01 NOTE — Anesthesia Postprocedure Evaluation (Signed)
  Anesthesia Post-op Note  Patient: [name]  Procedure(s) Performed: Procedure(s) (LRB): CYSTOSCOPY WITH RETROGRADE PYELOGRAM, AND LEFT STENT PLACEMENT (Left)  Patient Location: PACU  Anesthesia Type: General  Level of Consciousness: awake and alert   Airway and Oxygen Therapy: Patient Spontanous Breathing  Post-op Pain: mild  Post-op Assessment: Post-op Vital signs reviewed, Patient's Cardiovascular Status Stable, Respiratory Function Stable, Patent Airway and No signs of Nausea or vomiting  Last Vitals:  Filed Vitals:   09/01/13 1738  BP: 111/62  Pulse: 58  Temp: 37.4 C  Resp: 12    Post-op Vital Signs: stable   Complications: No apparent anesthesia complications

## 2013-09-01 NOTE — Discharge Instructions (Signed)
Kidney Stones Kidney stones (urolithiasis) are solid masses that form inside your kidneys. The intense pain is caused by the stone moving through the kidney, ureter, bladder, and urethra (urinary tract). When the stone moves, the ureter starts to spasm around the stone. The stone is usually passed in your pee (urine).  HOME CARE  Drink enough fluids to keep your pee clear or pale yellow. This helps to get the stone out.  Strain all pee through the provided strainer. Do not pee without peeing through the strainer, not even once. If you pee the stone out, catch it in the strainer. The stone may be as small as a grain of salt. Take this to your doctor. This will help your doctor figure out what you can do to try to prevent more kidney stones.  Only take medicine as told by your doctor.  Follow up with your doctor as told.  Get follow-up X-rays as told by your doctor. GET HELP IF: You have pain that gets worse even if you have been taking pain medicine. GET HELP RIGHT AWAY IF:   Your pain does not get better with medicine.  You have a fever or shaking chills.  Your pain increases and gets worse over 18 hours.  You have new belly (abdominal) pain.  You feel faint or pass out.  You are unable to pee. MAKE SURE YOU:   Understand these instructions.  Will watch your condition.  Will get help right away if you are not doing well or get worse. Document Released: 01/03/2008 Document Revised: 03/19/2013 Document Reviewed: 12/18/2012 [employer] Patient Information 2014 [employer].  

## 2013-09-01 NOTE — H&P (Signed)
H&P  Requesting provider:  Dr. Leonard Schwartz  CC: Left ureter stone  HPI: 68 year old female previous patient of Dr. Amalia Hailey to establish care of Dr. gradient 2011 presents to the ER with left flank pain. She presented ER 08/14/13 where she was found to have a left distal ureter stone. At that time she did not have a urinary tract infection. Saturday she began to develop chills and abdominal pain. Earlier this morning she developed a fever to 101.9. She presented to the ER where she is found to have leukocytosis, but no significant fever. She a KUB which cannot rule out passage of stone versus retained stone. The stone was 2 mm in size. Located in the left distal ureter. Nothing makes it better or worse.  We discussed the risk of urine tract infection. We discussed management options including placement of ureter stent, ureteroscopy, or stent and an interval ureteroscopy. We have discussed the risk and benefits as well as side effects which are listed below. Today I discussed with the family that it is possible he could do ureteroscopy depending on what I see in the operating room. Discussed the side effects of stent discomfort.  Today we discussed the management of urinary stones. These options include observation, ureteroscopy, shockwave lithotripsy, and PCNL. We discussed which options are relevant to these particular stones. We discussed the natural history of stones as well as the complications of untreated stones and the impact on quality of life without treatment as well as with each of the above listed treatments. We also discussed the efficacy of each treatment in its ability to clear the stone burden. With any of these management options I discussed the signs and symptoms of infection and the need for emergent treatment should these be experienced. For each option we discussed the ability of each procedure to clear the patient of their stone burden.  For observation I described the risks which  include but are not limited to silent renal damage, life-threatening infection, need for emergent surgery, failure to pass stone, and pain.  For ureteroscopy I described the risks which include heart attack, stroke, pulmonary embolus, death, bleeding, infection, damage to contiguous structures, positioning injury, ureteral stricture, ureteral avulsion, ureteral injury, need for ureteral stent, inability to perform ureteroscopy, need for an interval procedure, inability to clear stone burden, stent discomfort and pain.  For shockwave lithotripsy I described the risks which include arrhythmia, kidney contusion, kidney hemorrhage, need for transfusion, long-term risk of diabetes or hypertension, back discomfort, flank ecchymosis, flank abrasion, inability to break up stone, inability to pass stone fragments, Steinstrasse, infection associated with obstructing stones, need for different surgical procedure, need for repeat shockwave lithotripsy, and death.      PMH: Past Medical History  Diagnosis Date  . Hypertension   . Allergy     seasonal  . GERD (gastroesophageal reflux disease)   . Hyperlipidemia     NMR 2005  . Osteopenia     [facility]  . Hx of colonic polyps   . Diverticulosis of colon   . Interstitial cystitis   . Vitamin D deficiency   . Xerostomia     and Xeroophthalmia  . Arthritis   . Dry eye syndrome   . Osteoarthritis   . Cystitis   . Incontinence   . Ankylosing spondylitis   . HLA B27 (HLA B27 positive)   . Frozen shoulder   . Bell palsy   . Anxiety  PSH: Past Surgical History  Procedure Laterality Date  . Total abdominal hysterectomy w/ bilateral salpingoophorectomy      dysfunctional menses  . Bladder surgery      for incontinence  . G 1 p 1    . Colonscopy      tics 10-2002  . Interstitial cystitis      with clot in catheter  . Elevated lfts      due to Elmiron  . Colonoscopy w/ polypectomy      adenomatous poyp 04-2008   . Finger surgery Left 2004    2nd finger    Allergies: Allergies  Allergen Reactions  . Nitroglycerin Anaphylaxis    Hypotensive after NTG administration in ER during chest pain evaluation  . Pentosan Polysulfate Sodium     Elevated LFTs.......Marland Kitchen elmiron   . Promethazine Hcl     "jittery on the inside"  . Lisinopril Cough  . Sulfamethoxazole-Trimethoprim Nausea Only    Medications:  (Not in a hospital admission)   Social History: History   Social History  . Marital Status: Married    Spouse Name: N/A    Number of Children: 1  . Years of Education: N/A   Occupational History  . retired    Social History Main Topics  . Smoking status: Never Smoker   . Smokeless tobacco: Never Used  . Alcohol Use: No  . Drug Use: No  . Sexual Activity: Not on file   Other Topics Concern  . Not on file   Social History Narrative   Married          Family History: Family History  Problem Relation Age of Onset  . Coronary artery disease Mother   . Hypertension Mother   . Asthma Mother   . Osteoporosis Mother   . Dementia Mother   . Stroke Mother     mini cva  . Diabetes Father   . Cirrhosis Father     non alcoholic  . Cancer Sister     colon  . Colon cancer Sister 79  . Cancer Paternal Aunt     2 aunts had renal cancer  . Coronary artery disease Maternal Grandfather   . Cancer Maternal Aunt     breast  . Diabetes Paternal Aunt   . Diabetes Paternal Grandmother     Review of Systems: Positive: Chills, urgency. Negative: SOB, chest pain, or itching.  A further 10 point review of systems was negative except what is listed in the HPI.  Physical Exam: Filed Vitals:   09/01/13 1407  BP: 95/44  Pulse: 63  Temp:   Resp:     General: No acute distress.  Awake. Head:  Normocephalic.  Atraumatic. ENT:  EOMI.  Mucous membranes moist Neck:  Supple.  No lymphadenopathy. CV:  S1 present. S2 present. Regular rate. Pulmonary: Equal effort bilaterally.  Clear to  auscultation bilaterally. Abdomen: Soft.  Non- tender to palpation. Skin:  Normal turgor.  No visible rash. Extremity: No gross deformity of bilateral upper extremities.  No gross deformity of    bilateral lower extremities. Neurologic: Alert. Appropriate mood.   Studies:  Recent Labs     09/01/13  0845  HGB  11.8*  WBC  11.6*  PLT  164    Recent Labs     09/01/13  0845  NA  141  K  4.1  CL  104  CO2  23  BUN  18  CREATININE  1.30*  CALCIUM  9.3  GFRNONAA  41*  GFRAA  47*     No results found for this basename: PT, INR, APTT,  in the last 72 hours   No components found with this basename: ABG,     Assessment:  Left distal ureter stone. Fever.  Plan: To OR for cystoscopy, possible left ureteroscopy with laser lithotripsy, possible left retrograde pyelogram, left ureter stent placement.    Pager: [phone]    CC: Dr. Audie Pinto

## 2013-09-02 ENCOUNTER — Encounter (HOSPITAL_COMMUNITY): Payer: Self-pay | Admitting: Urology

## 2013-09-02 ENCOUNTER — Telehealth: Payer: Self-pay | Admitting: *Deleted

## 2013-09-02 NOTE — Telephone Encounter (Signed)
Message copied by Harl Bowie on Tue Sep 02, 2013  4:41 PM ------      Message from: Hendricks Limes      Created: Tue Sep 02, 2013  8:17 AM       Please verify UTI has been treated ------

## 2013-09-02 NOTE — Telephone Encounter (Signed)
LMOM @ (4:42pm) asking the pt to RTC regarding message below.//AB/CMA

## 2013-09-03 LAB — URINE CULTURE: Colony Count: >=100000

## 2013-09-03 NOTE — Telephone Encounter (Signed)
Spoke with the pt and she stated that she has been treated for the UTI, and now she's been treated for kidney stones.//AB/CMA

## 2013-09-09 DIAGNOSIS — N201 Calculus of ureter: Secondary | ICD-10-CM | POA: Diagnosis not present

## 2013-09-11 ENCOUNTER — Ambulatory Visit (INDEPENDENT_AMBULATORY_CARE_PROVIDER_SITE_OTHER): Payer: Medicare Other | Admitting: Internal Medicine

## 2013-09-11 ENCOUNTER — Encounter: Payer: Self-pay | Admitting: Internal Medicine

## 2013-09-11 VITALS — BP 128/82 | HR 60 | Temp 98.4°F | Resp 14 | Ht 62.0 in | Wt 168.2 lb

## 2013-09-11 DIAGNOSIS — R109 Unspecified abdominal pain: Secondary | ICD-10-CM

## 2013-09-11 DIAGNOSIS — R102 Pelvic and perineal pain: Secondary | ICD-10-CM

## 2013-09-11 DIAGNOSIS — N3010 Interstitial cystitis (chronic) without hematuria: Secondary | ICD-10-CM | POA: Diagnosis not present

## 2013-09-11 NOTE — Progress Notes (Signed)
Pre visit review using our clinic review tool, if applicable. No additional management support is needed unless otherwise documented below in the visit note/SLS  

## 2013-09-11 NOTE — Progress Notes (Signed)
Subjective:    Patient ID: [name], female    DOB: [birth year], 68 y.o.   MRN: [id number]  HPI She presents with  constant abdominal cramping pain up to level 5 from the infraumbilical area to the suprapubic area since 08/14/13. Naproxen helped but Percocet was more beneficial for the pain  She had a urinary tract infection the context of renal calculi; stent was placed 09/01/13 in the left ureter. The pain was a level 9 while the stent was in place.That operative note was reviewed. The stent was removed 2/10. Pain has decreased back to level 5 with stent removal  She remains on Cipro; urine culture 09/01/13 revealed Escherichia coli over 100,000 organisms. This was indeed sensitive to Cipro.  She describes anorexia with these events  She has a past history of interstitial cystitis.The pain is similar to that with IC but worse in intensity    Review of Systems  She denies fever, chills, or sweats  She has no dysuria, pyuria, or hematuria.  She has no diarrhea, melena, rectal bleeding.  No rash or skin change @ site of pain.        Objective:   Physical Exam General appearance is one of good health and nourishment w/o distress.  Eyes: No conjunctival inflammation or scleral icterus is present.  Oral exam: Dental hygiene is good; lips and gums are healthy appearing.There is no oropharyngeal erythema or exudate noted.   Heart:  Normal rate and regular rhythm. S1 and S2 normal without gallop, murmur, click, rub or other extra sounds     Lungs:Chest clear to auscultation; no wheezes, rhonchi,rales ,or rubs present.No increased work of breathing.   Abdomen: bowel sounds normal, soft and non-tender without masses, organomegaly or hernias noted.  No guarding or rebound . No tenderness over the flanks to percussion  Musculoskeletal: Able to lie flat and sit up without help. Negative straight leg raising bilaterally. Gait normal  Skin:Warm & dry.  Intact without suspicious lesions  or rashes ; no jaundice or tenting  Lymphatic: No lymphadenopathy is noted about the head, neck, axilla  Assessment & Plan:  #1 suprapubic pain , probably IC as major component Plan: Urology F/U with repeat C&S after Cipro completed

## 2013-09-11 NOTE — Patient Instructions (Signed)
I recommend an  Interstitial Cystitis Specialist  consultation to determine optimal therapy if symptoms persist after Cipro completed.

## 2013-09-12 ENCOUNTER — Ambulatory Visit: Payer: Medicare Other | Admitting: Internal Medicine

## 2013-09-19 ENCOUNTER — Other Ambulatory Visit: Payer: Medicare Other

## 2013-09-19 ENCOUNTER — Other Ambulatory Visit: Payer: Self-pay | Admitting: *Deleted

## 2013-09-19 DIAGNOSIS — N390 Urinary tract infection, site not specified: Secondary | ICD-10-CM | POA: Diagnosis not present

## 2013-09-20 LAB — URINE CULTURE
Colony Count: NO GROWTH
ORGANISM ID, BACTERIA: NO GROWTH

## 2013-10-24 DIAGNOSIS — L600 Ingrowing nail: Secondary | ICD-10-CM | POA: Diagnosis not present

## 2013-10-24 DIAGNOSIS — B351 Tinea unguium: Secondary | ICD-10-CM | POA: Diagnosis not present

## 2013-10-24 DIAGNOSIS — B353 Tinea pedis: Secondary | ICD-10-CM | POA: Diagnosis not present

## 2013-11-05 DIAGNOSIS — D1801 Hemangioma of skin and subcutaneous tissue: Secondary | ICD-10-CM | POA: Diagnosis not present

## 2013-11-05 DIAGNOSIS — L821 Other seborrheic keratosis: Secondary | ICD-10-CM | POA: Diagnosis not present

## 2013-11-05 DIAGNOSIS — D239 Other benign neoplasm of skin, unspecified: Secondary | ICD-10-CM | POA: Diagnosis not present

## 2013-11-05 DIAGNOSIS — L739 Follicular disorder, unspecified: Secondary | ICD-10-CM | POA: Diagnosis not present

## 2013-11-05 DIAGNOSIS — L608 Other nail disorders: Secondary | ICD-10-CM | POA: Diagnosis not present

## 2013-11-12 ENCOUNTER — Encounter: Payer: Self-pay | Admitting: Pulmonary Disease

## 2013-11-12 ENCOUNTER — Ambulatory Visit (INDEPENDENT_AMBULATORY_CARE_PROVIDER_SITE_OTHER): Payer: Medicare Other | Admitting: Pulmonary Disease

## 2013-11-12 VITALS — BP 142/80 | HR 62 | Ht 62.5 in | Wt 168.0 lb

## 2013-11-12 DIAGNOSIS — G4733 Obstructive sleep apnea (adult) (pediatric): Secondary | ICD-10-CM

## 2013-11-12 DIAGNOSIS — R0609 Other forms of dyspnea: Secondary | ICD-10-CM | POA: Diagnosis not present

## 2013-11-12 DIAGNOSIS — R0683 Snoring: Secondary | ICD-10-CM

## 2013-11-12 DIAGNOSIS — R0989 Other specified symptoms and signs involving the circulatory and respiratory systems: Secondary | ICD-10-CM

## 2013-11-12 NOTE — Assessment & Plan Note (Signed)
She has snoring, sleep disruption, and daytime sleepiness.  She has history of HTN.  I am concerned she could have sleep apnea.  I have explained how sleep apnea can affect the patient's health.  Driving precautions and importance of weight loss were discussed.  Treatment options for sleep apnea were reviewed.  To further assess will arrange for in lab sleep study.

## 2013-11-12 NOTE — Patient Instructions (Signed)
Will arrange for sleep study Will call to arrange for follow up after sleep study reviewed 

## 2013-11-12 NOTE — Progress Notes (Deleted)
   Subjective:    Patient ID: [name], female    DOB: 10-29-1945, 68 y.o.   MRN: [id number]  HPI    Review of Systems  Constitutional: Negative for fever, chills, diaphoresis, activity change, appetite change, fatigue and unexpected weight change.  HENT: Negative for congestion, dental problem, ear discharge, ear pain, facial swelling, hearing loss, mouth sores, nosebleeds, postnasal drip, rhinorrhea, sinus pressure, sneezing, sore throat, tinnitus, trouble swallowing and voice change.   Eyes: Negative for photophobia, discharge, itching and visual disturbance.  Respiratory: Positive for cough and shortness of breath. Negative for apnea, choking, chest tightness, wheezing and stridor.   Cardiovascular: Negative for chest pain, palpitations and leg swelling.  Gastrointestinal: Positive for abdominal pain. Negative for nausea, vomiting, constipation, blood in stool and abdominal distention.  Genitourinary: Negative for dysuria, urgency, frequency, hematuria, flank pain, decreased urine volume and difficulty urinating.  Musculoskeletal: Negative for arthralgias, back pain, gait problem, joint swelling, myalgias, neck pain and neck stiffness.  Skin: Negative for color change, pallor and rash.  Neurological: Negative for dizziness, tremors, seizures, syncope, speech difficulty, weakness, light-headedness, numbness and headaches.  Hematological: Negative for adenopathy. Does not bruise/bleed easily.  Psychiatric/Behavioral: Negative for confusion, sleep disturbance and agitation. The patient is not nervous/anxious.        Objective:   Physical Exam        Assessment & Plan:

## 2013-11-12 NOTE — Progress Notes (Signed)
Chief Complaint  Patient presents with  . Sleep Consult    re-establish. Epworth Score: 5.    History of Present Illness: [name] is a 68 y.o. female for evaluation of sleep problems.  I last saw her in 2013.  She was to have sleep study then, but had to defer this because of her husbands health issues.  She continues to have sleep issues.  She feels tired all the time.  She still snores.  She is concerned she could have sleep apnea.  She also sometimes has trouble falling asleep.  She gets seasonal allergies, and this makes it more difficult for her to fall asleep.  She gets drowsy after eating, and can fall asleep when watching TV.  She will grind her teeth at night.  There was concern recently after surgery that she had trouble with her breathing while asleep.  She goes to sleep between 1030 and 1130 pm.  She falls asleep quickly on most nights.  She wakes up one time to use the bathroom.  She gets out of bed at 9 am.  She feels tired in the morning.  She denies morning headache.  She does not use anything to help her fall sleep or stay awake.  She denies sleep walking, sleep talking, or nightmares.  There is no history of restless legs.  She denies sleep hallucinations, sleep paralysis, or cataplexy.  The Epworth score is 5 out of 24.  [name]  has a past medical history of Hypertension; Allergy; GERD (gastroesophageal reflux disease); Hyperlipidemia; Osteopenia; colonic polyps; Diverticulosis of colon; Interstitial cystitis; Vitamin D deficiency; Xerostomia; Arthritis; Dry eye syndrome; Osteoarthritis; Cystitis; Incontinence; Ankylosing spondylitis; HLA B27 (HLA B27 positive); Frozen shoulder; Bell palsy; and Anxiety.  ANDRES ESCANDON  has past surgical history that includes Total abdominal hysterectomy w/ bilateral salpingoophorectomy; Bladder surgery; G 1 P 1; colonscopy; interstitial cystitis; elevated LFTs; Colonoscopy w/ polypectomy; Finger surgery (Left, 2004); and  Cystoscopy with retrograde pyelogram, ureteroscopy and stent placement (Left, 09/01/2013).  Prior to Admission medications   Medication Sig Start Date End Date Taking? Authorizing Provider  aspirin 81 MG tablet Take 81 mg by mouth daily.     Yes Historical Provider, MD  cetirizine (ZYRTEC) 10 MG tablet Take 10 mg by mouth daily.     Yes Historical Provider, MD  Cholecalciferol (VITAMIN D-3) 5000 UNITS TABS Take by mouth daily.   Yes Historical Provider, MD  ciprofloxacin (CIPRO) 500 MG tablet Take 1 tablet (500 mg total) by mouth 2 (two) times daily. 09/01/13  Yes Molli Hazard, MD  cycloSPORINE (RESTASIS) 0.05 % ophthalmic emulsion Place 1 drop into both eyes 2 (two) times daily.    Yes Historical Provider, MD  furosemide (LASIX) 40 MG tablet Take 1/2 tablet by mouth   every day 07/16/13  Yes Hendricks Limes, MD  ketoconazole (NIZORAL) 2 % cream APPLY TO AFFECTED AREA EVERY DAY AS NEEDED 04/12/12  Yes Hendricks Limes, MD  metoprolol succinate (TOPROL-XL) 50 MG 24 hr tablet Take half tablet by mouth daily 07/16/13  Yes Hendricks Limes, MD  mometasone (NASONEX) 50 MCG/ACT nasal spray Use 2 sprays each nostril daily 07/16/13  Yes Hendricks Limes, MD  naproxen (NAPROSYN) 500 MG tablet Take 1 tablet (500 mg total) by mouth 2 (two) times daily with a meal. As needed for pain 08/14/13  Yes Kristen N Ward, DO  ondansetron (ZOFRAN) 4 MG tablet Take 1 tablet (4 mg total) by mouth every 6 (  six) hours. 08/14/13  Yes Kristen N Ward, DO  Polyethyl Glycol-Propyl Glycol (SYSTANE OP) Apply to eye as needed.   Yes Historical Provider, MD  senna-docusate (SENOKOT S) 8.6-50 MG per tablet Take 1 tablet by mouth 2 (two) times daily. 09/01/13  Yes Molli Hazard, MD  tamsulosin (FLOMAX) 0.4 MG CAPS capsule Take 1 capsule (0.4 mg total) by mouth daily. 08/14/13  Yes Kristen N Ward, DO  vitamin E 400 UNIT capsule Take 400 Units by mouth daily.   Yes Historical Provider, MD    Allergies  Allergen Reactions  .  Nitroglycerin Anaphylaxis    Hypotensive after NTG administration in ER during chest pain evaluation  . Pentosan Polysulfate Sodium     Elevated LFTs.......Marland Kitchen elmiron   . Promethazine Hcl     "jittery on the inside"  . Lisinopril Cough  . Sulfamethoxazole-Trimethoprim Nausea Only    Her family history includes Asthma in her mother; Cancer in her maternal aunt, paternal aunt, and sister; Cirrhosis in her father; Colon cancer (age of onset: 13) in her sister; Coronary artery disease in her maternal grandfather and mother; Dementia in her mother; Diabetes in her father, paternal aunt, and paternal grandmother; Hypertension in her mother; Osteoporosis in her mother; Stroke in her mother.  She  reports that she has never smoked. She has never used smokeless tobacco. She reports that she does not drink alcohol or use illicit drugs.  Review of Systems  Constitutional: Negative for fever, chills, diaphoresis, activity change, appetite change, fatigue and unexpected weight change.  HENT: Negative for congestion, dental problem, ear discharge, ear pain, facial swelling, hearing loss, mouth sores, nosebleeds, postnasal drip, rhinorrhea, sinus pressure, sneezing, sore throat, tinnitus, trouble swallowing and voice change.   Eyes: Negative for photophobia, discharge, itching and visual disturbance.  Respiratory: Positive for cough and shortness of breath. Negative for apnea, choking, chest tightness, wheezing and stridor.   Cardiovascular: Negative for chest pain, palpitations and leg swelling.  Gastrointestinal: Positive for abdominal pain. Negative for nausea, vomiting, constipation, blood in stool and abdominal distention.  Genitourinary: Negative for dysuria, urgency, frequency, hematuria, flank pain, decreased urine volume and difficulty urinating.  Musculoskeletal: Negative for arthralgias, back pain, gait problem, joint swelling, myalgias, neck pain and neck stiffness.  Skin: Negative for color  change, pallor and rash.  Neurological: Negative for dizziness, tremors, seizures, syncope, speech difficulty, weakness, light-headedness, numbness and headaches.  Hematological: Negative for adenopathy. Does not bruise/bleed easily.  Psychiatric/Behavioral: Negative for confusion, sleep disturbance and agitation. The patient is not nervous/anxious.    Physical Exam:  General - No distress ENT - No sinus tenderness, no oral exudate, no LAN, no thyromegaly, TM clear, pupils equal/reactive Cardiac - s1s2 regular, no murmur, pulses symmetric Chest - No wheeze/rales/dullness, good air entry, normal respiratory excursion Back - No focal tenderness Abd - Soft, non-tender, no organomegaly, + bowel sounds Ext - No edema Neuro - Normal strength, cranial nerves intact Skin - No rashes Psych - Normal mood, and behavior  Assessment/plan:  Chesley Mires, M.D. Pager [phone]

## 2013-12-01 DIAGNOSIS — M25519 Pain in unspecified shoulder: Secondary | ICD-10-CM | POA: Diagnosis not present

## 2013-12-01 DIAGNOSIS — M19019 Primary osteoarthritis, unspecified shoulder: Secondary | ICD-10-CM | POA: Diagnosis not present

## 2013-12-01 DIAGNOSIS — M1990 Unspecified osteoarthritis, unspecified site: Secondary | ICD-10-CM | POA: Diagnosis not present

## 2013-12-01 DIAGNOSIS — M2550 Pain in unspecified joint: Secondary | ICD-10-CM | POA: Diagnosis not present

## 2013-12-01 DIAGNOSIS — R894 Abnormal immunological findings in specimens from other organs, systems and tissues: Secondary | ICD-10-CM | POA: Diagnosis not present

## 2013-12-01 DIAGNOSIS — Z1589 Genetic susceptibility to other disease: Secondary | ICD-10-CM | POA: Diagnosis not present

## 2013-12-04 ENCOUNTER — Other Ambulatory Visit: Payer: Medicare Other

## 2013-12-04 ENCOUNTER — Ambulatory Visit (INDEPENDENT_AMBULATORY_CARE_PROVIDER_SITE_OTHER): Payer: Medicare Other | Admitting: Internal Medicine

## 2013-12-04 ENCOUNTER — Encounter: Payer: Self-pay | Admitting: Internal Medicine

## 2013-12-04 VITALS — BP 148/90 | HR 62 | Ht 62.5 in | Wt 169.0 lb

## 2013-12-04 DIAGNOSIS — J309 Allergic rhinitis, unspecified: Secondary | ICD-10-CM | POA: Diagnosis not present

## 2013-12-04 DIAGNOSIS — H1045 Other chronic allergic conjunctivitis: Secondary | ICD-10-CM | POA: Diagnosis not present

## 2013-12-04 DIAGNOSIS — J302 Other seasonal allergic rhinitis: Secondary | ICD-10-CM

## 2013-12-04 DIAGNOSIS — H109 Unspecified conjunctivitis: Secondary | ICD-10-CM

## 2013-12-04 DIAGNOSIS — J3089 Other allergic rhinitis: Principal | ICD-10-CM

## 2013-12-04 DIAGNOSIS — H1010 Acute atopic conjunctivitis, unspecified eye: Secondary | ICD-10-CM

## 2013-12-04 NOTE — Patient Instructions (Signed)
Order lab- Allergy profile, Food IgE profile  Dx Allergic rhinitis and conjunctivitis

## 2013-12-04 NOTE — Progress Notes (Signed)
12/04/13- 67 yoF never smoker Self referral for allergy consult.  Pt has not had an allergy test since 1970's, is interested in both environmental and food allergy testing.   Being followed by Dr Halford Chessman for OSA She questions allergic rhinitis. Seasonal head congestion, eyes itching, sneezing and postnasal drip are worst in early spring and in the fall. House dust is also a trigger but she blames pollens. Allergy skin testing in the 1970s and was on allergy vaccine for one year at that time. No ENT surgery and never diagnosed with asthma although she has heard little wheezing a few times. Foods- beans and pork cause stomach ache. She eats mostly chicken and fish. 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 No skin allergy problems but she chooses hypoallergenic products. Insect stings cause large local reactions. No problems with latex, contrast dye or aspirin. No history of nasal polyps. Environment-house-no mold, no basement, no pets, no smokers. Family-2 sisters had asthma on allergy vaccine, mother had asthma, daughter has mild asthma.  Prior to Admission medications   Medication Sig Start Date End Date Taking? Authorizing Provider  aspirin 81 MG tablet Take 81 mg by mouth daily.     Yes Historical Provider, MD  cetirizine (ZYRTEC) 10 MG tablet Take 10 mg by mouth daily.     Yes Historical Provider, MD  Cholecalciferol (VITAMIN D-3) 5000 UNITS TABS Take by mouth daily.   Yes Historical Provider, MD  cycloSPORINE (RESTASIS) 0.05 % ophthalmic emulsion Place 1 drop into both eyes 2 (two) times daily.    Yes Historical Provider, MD  metoprolol succinate (TOPROL-XL) 50 MG 24 hr tablet Take half tablet by mouth daily 07/16/13  Yes Hendricks Limes, MD  vitamin E 400 UNIT capsule Take 400 Units by mouth daily.   Yes Historical Provider, MD  furosemide (LASIX) 40 MG tablet Take 1/2 tablet by mouth   every day 07/16/13   Hendricks Limes, MD  mometasone (NASONEX) 50 MCG/ACT  nasal spray Use 2 sprays each nostril daily 07/16/13   Hendricks Limes, MD   Past Medical History  Diagnosis Date  . Hypertension   . Allergy     seasonal  . GERD (gastroesophageal reflux disease)   . Hyperlipidemia     NMR 2005  . Osteopenia     [facility]  . Hx of colonic polyps   . Diverticulosis of colon   . Interstitial cystitis   . Vitamin D deficiency   . Xerostomia     and Xeroophthalmia  . Arthritis   . Dry eye syndrome   . Osteoarthritis   . Cystitis   . Incontinence   . Ankylosing spondylitis   . HLA B27 (HLA B27 positive)   . Frozen shoulder   . Bell palsy   . Anxiety    Past Surgical History  Procedure Laterality Date  . Total abdominal hysterectomy w/ bilateral salpingoophorectomy      dysfunctional menses  . Bladder surgery      for incontinence  . G 1 p 1    . Colonscopy      tics 10-2002  . Interstitial cystitis      with clot in catheter  . Elevated lfts      due to Elmiron  . Colonoscopy w/ polypectomy      adenomatous poyp 04-2008  . Finger surgery Left 2004    2nd finger  . Cystoscopy with retrograde pyelogram, ureteroscopy and  stent placement Left 09/01/2013    Procedure: CYSTOSCOPY WITH RETROGRADE PYELOGRAM, AND LEFT STENT PLACEMENT;  Surgeon: Molli Hazard, MD;  Location: [facility];  Service: Urology;  Laterality: Left;   Family History  Problem Relation Age of Onset  . Coronary artery disease Mother   . Hypertension Mother   . Asthma Mother   . Osteoporosis Mother   . Dementia Mother   . Stroke Mother     mini cva  . Diabetes Father   . Cirrhosis Father     non alcoholic  . Cancer Sister     colon  . Colon cancer Sister 27  . Cancer Paternal Aunt     2 aunts had renal cancer  . Coronary artery disease Maternal Grandfather   . Cancer Maternal Aunt     breast  . Diabetes Paternal Aunt   . Diabetes Paternal Grandmother    History   Social History  . Marital Status: Married    Spouse Name: N/A     Number of Children: 1  . Years of Education: N/A   Occupational History  . retired    Social History Main Topics  . Smoking status: Never Smoker   . Smokeless tobacco: Never Used  . Alcohol Use: No  . Drug Use: No  . Sexual Activity: Not on file   Other Topics Concern  . Not on file   Social History Narrative   Married         ROS-see HPI Constitutional:   No-   weight loss, night sweats, fevers, chills, fatigue, lassitude. HEENT:   No-  headaches, difficulty swallowing, tooth/dental problems, sore throat,       +sneezing,+ itching, ear ache, +nasal congestion, +post nasal drip,  CV:  No-   chest pain, orthopnea, PND, swelling in lower extremities, anasarca,                                              dizziness, palpitations Resp: No-   shortness of breath with exertion or at rest.              No-   productive cough,  No non-productive cough,  No- coughing up of blood.              No-   change in color of mucus.  No- wheezing.   Skin: No-   rash or lesions. GI:  No-   heartburn, indigestion, abdominal pain, nausea, vomiting, diarrhea,                 change in bowel habits, loss of appetite GU: No-   dysuria, change in color of urine, no urgency or frequency.  No- flank pain. MS:  No-   joint pain or swelling.  No- decreased range of motion.  No- back pain. Neuro-     nothing unusual Psych:  No- change in mood or affect. No depression or anxiety.  No memory loss.  OBJ- Physical Exam General- Alert, Oriented, Affect-appropriate, Distress- none acute Skin- rash-none, lesions- none, excoriation- none Lymphadenopathy- none Head- atraumatic            Eyes- Gross vision intact, PERRLA, conjunctivae and secretions clear            Ears- Hearing, canals-normal            Nose- +Turbinate edema, no-Septal dev, mucus,  polyps, erosion, perforation             Throat- Mallampati IV , mucosa clear , drainage- none, tonsils- atrophic Neck- flexible , trachea midline, no  stridor , thyroid nl, carotid no bruit Chest - symmetrical excursion , unlabored           Heart/CV- RRR , no murmur , no gallop  , no rub, nl s1 s2                           - JVD- none , edema- none, stasis changes- none, varices- none           Lung- clear to P&A, wheeze- none, cough- none , dullness-none, rub- none           Chest wall-  Abd- tender-no, distended-no, bowel sounds-present, HSM- no Br/ Gen/ Rectal- Not done, not indicated Extrem- cyanosis- none, clubbing, none, atrophy- none, strength- nl Neuro- grossly intact to observation

## 2013-12-05 LAB — ALLERGEN FOOD PROFILE SPECIFIC IGE
Apple: 0.44 kU/L — ABNORMAL HIGH
Chicken IgE: <0.1 kU/L
Egg White IgE: <0.1 kU/L
Fish Cod: <0.1 kU/L
IgE (Immunoglobulin E), Serum: 169.9 IU/mL (ref 0.0–180.0)
Milk IgE: <0.1 kU/L
Orange: <0.1 kU/L
Peanut IgE: 0.19 kU/L — ABNORMAL HIGH
Shrimp IgE: <0.1 kU/L
Tomato IgE: 0.13 kU/L — ABNORMAL HIGH
Tuna IgE: <0.1 kU/L

## 2013-12-05 LAB — ALLERGY FULL PROFILE
ALLERGEN, D PTERNOYSSINUS, D1: 5.86 kU/L — AB
Allergen,Goose feathers, e70: <0.1 kU/L
Aspergillus fumigatus, m3: <0.1 kU/L
BOX ELDER: 0.69 kU/L — AB
Bermuda Grass: <0.1 kU/L
CAT DANDER: 0.14 kU/L — AB
Candida Albicans: <0.1 kU/L
Common Ragweed: 0.48 kU/L — ABNORMAL HIGH
Curvularia lunata: <0.1 kU/L
D. farinae: 9.15 kU/L — ABNORMAL HIGH
Dog Dander: <0.1 kU/L
G005 Rye, Perennial: <0.1 kU/L
Goldenrod: <0.1 kU/L
HOUSE DUST HOLLISTER: 1.14 kU/L — AB
Helminthosporium halodes: <0.1 kU/L
Oak: 0.71 kU/L — ABNORMAL HIGH
Plantain: <0.1 kU/L
Stemphylium Botryosum: <0.1 kU/L
Timothy Grass: <0.1 kU/L

## 2013-12-06 DIAGNOSIS — H1010 Acute atopic conjunctivitis, unspecified eye: Secondary | ICD-10-CM | POA: Insufficient documentation

## 2013-12-06 DIAGNOSIS — J302 Other seasonal allergic rhinitis: Secondary | ICD-10-CM | POA: Insufficient documentation

## 2013-12-06 DIAGNOSIS — J3089 Other allergic rhinitis: Principal | ICD-10-CM

## 2013-12-06 NOTE — Assessment & Plan Note (Signed)
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 Treat with over-the-counter antihistamine as needed for now

## 2013-12-06 NOTE — Assessment & Plan Note (Signed)
Educated dry eye versus allergy Plan-systemic antihistamine, lubricant eyedrops

## 2013-12-08 DIAGNOSIS — R3915 Urgency of urination: Secondary | ICD-10-CM | POA: Diagnosis not present

## 2013-12-08 DIAGNOSIS — R32 Unspecified urinary incontinence: Secondary | ICD-10-CM | POA: Diagnosis not present

## 2013-12-08 DIAGNOSIS — R350 Frequency of micturition: Secondary | ICD-10-CM | POA: Diagnosis not present

## 2013-12-10 DIAGNOSIS — M62838 Other muscle spasm: Secondary | ICD-10-CM | POA: Diagnosis not present

## 2013-12-10 DIAGNOSIS — M6281 Muscle weakness (generalized): Secondary | ICD-10-CM | POA: Diagnosis not present

## 2013-12-10 DIAGNOSIS — R32 Unspecified urinary incontinence: Secondary | ICD-10-CM | POA: Diagnosis not present

## 2013-12-16 ENCOUNTER — Ambulatory Visit (INDEPENDENT_AMBULATORY_CARE_PROVIDER_SITE_OTHER): Payer: Medicare Other | Admitting: Internal Medicine

## 2013-12-16 ENCOUNTER — Other Ambulatory Visit (INDEPENDENT_AMBULATORY_CARE_PROVIDER_SITE_OTHER): Payer: Medicare Other

## 2013-12-16 ENCOUNTER — Encounter: Payer: Self-pay | Admitting: Internal Medicine

## 2013-12-16 VITALS — BP 138/88 | HR 63 | Temp 97.2°F | Wt 166.0 lb

## 2013-12-16 DIAGNOSIS — R799 Abnormal finding of blood chemistry, unspecified: Secondary | ICD-10-CM | POA: Diagnosis not present

## 2013-12-16 DIAGNOSIS — G43809 Other migraine, not intractable, without status migrainosus: Secondary | ICD-10-CM | POA: Diagnosis not present

## 2013-12-16 DIAGNOSIS — H04129 Dry eye syndrome of unspecified lacrimal gland: Secondary | ICD-10-CM | POA: Diagnosis not present

## 2013-12-16 DIAGNOSIS — H5340 Unspecified visual field defects: Secondary | ICD-10-CM | POA: Diagnosis not present

## 2013-12-16 DIAGNOSIS — H43819 Vitreous degeneration, unspecified eye: Secondary | ICD-10-CM | POA: Diagnosis not present

## 2013-12-16 DIAGNOSIS — R7309 Other abnormal glucose: Secondary | ICD-10-CM | POA: Insufficient documentation

## 2013-12-16 DIAGNOSIS — R7989 Other specified abnormal findings of blood chemistry: Secondary | ICD-10-CM | POA: Insufficient documentation

## 2013-12-16 LAB — BASIC METABOLIC PANEL
BUN: 18 mg/dL (ref 6–23)
CHLORIDE: 106 meq/L (ref 96–112)
CO2: 26 mEq/L (ref 19–32)
Calcium: 9.6 mg/dL (ref 8.4–10.5)
Creatinine, Ser: 1.1 mg/dL (ref 0.4–1.2)
GFR: 52.77 mL/min — ABNORMAL LOW (ref >60.00)
Glucose, Bld: 98 mg/dL (ref 70–99)
POTASSIUM: 4.1 meq/L (ref 3.5–5.1)
Sodium: 139 mEq/L (ref 135–145)

## 2013-12-16 LAB — CBC WITH DIFFERENTIAL/PLATELET
Basophils Absolute: 0 10*3/uL (ref 0.0–0.1)
Basophils Relative: 0.4 % (ref 0.0–3.0)
EOS PCT: 2.3 % (ref 0.0–5.0)
Eosinophils Absolute: 0.1 10*3/uL (ref 0.0–0.7)
HEMATOCRIT: 38.7 % (ref 36.0–46.0)
Hemoglobin: 13.1 g/dL (ref 12.0–15.0)
LYMPHS ABS: 2 10*3/uL (ref 0.7–4.0)
Lymphocytes Relative: 31.1 % (ref 12.0–46.0)
MCHC: 33.9 g/dL (ref 30.0–36.0)
MCV: 87.5 fl (ref 78.0–100.0)
MONOS PCT: 7.9 % (ref 3.0–12.0)
Monocytes Absolute: 0.5 10*3/uL (ref 0.1–1.0)
NEUTROS ABS: 3.7 10*3/uL (ref 1.4–7.7)
Neutrophils Relative %: 58.3 % (ref 43.0–77.0)
Platelets: 245 10*3/uL (ref 150.0–400.0)
RBC: 4.43 Mil/uL (ref 3.87–5.11)
RDW: 13.1 % (ref 11.5–15.5)
WBC: 6.4 10*3/uL (ref 4.0–10.5)

## 2013-12-16 LAB — HEMOGLOBIN A1C: Hgb A1c MFr Bld: 6 % (ref 4.6–6.5)

## 2013-12-16 LAB — SEDIMENTATION RATE: SED RATE: 10 mm/h (ref 0–22)

## 2013-12-16 NOTE — Progress Notes (Signed)
Pre visit review using our clinic review tool, if applicable. No additional management support is needed unless otherwise documented below in the visit note. 

## 2013-12-16 NOTE — Progress Notes (Signed)
Subjective:    Patient ID: [name], female    DOB: 09-27-1945, 68 y.o.   MRN: [id number]  HPI   Symptoms began 12/14/13 as blurring of vision in the lateral aspect of her left eye and in a quarter of the medial field of her right eye. This was described as a "cloud". Symptoms lasted 11:30 AM-1 PM. She was watching a sermon on TV and could not see the left side of minister's face  She also described a pattern of " < and> " symbols in areas of deficit.  There was no neurologic or cardiologic prodrome prior to vision changes  Since yesterday she says she has noted a sweet taste in her mouth  She has no history of migraines. She has been on 81 mg of aspirin prophylactically  In February of this year she did have mildly elevated creatinine of 1.3; a nonfasting glucose was 163. Her last A1c was 6.3% in   Review of Systems  She specifically denied were headache, limb weakness, tingling, or numbness prior to the event  She also noted no chest pain, dyspnea, or change in heart rhythm or rate.  She denies polyuria, polyphagia or polydipsia. There's been no significant change in weight.  She has no active constitutional symptoms.     Objective:   Physical Exam   Gen.: Healthy and well-nourished in appearance. Alert, appropriate and cooperative throughout exam. Appears younger than stated age  Head: Normocephalic without obvious abnormalities  Eyes: No corneal or conjunctival inflammation noted. Pupils equal round reactive to light and accommodation. Extraocular motion intact. Field of Vision grossly normal . Ears: External  ear exam reveals no significant lesions or deformities. Canals clear .TMs normal. Hearing is grossly normal bilaterally. Nose: External nasal exam reveals no deformity or inflammation. Nasal mucosa are pink and moist. No lesions or exudates noted.   Mouth: Oral mucosa and oropharynx reveal no lesions or exudates. Teeth in good repair. Neck: No deformities,  masses, or tenderness noted. Range of motion & Thyroid normal Lungs: Normal respiratory effort; chest expands symmetrically. Lungs are clear to auscultation without rales, wheezes, or increased work of breathing. Heart: Normal rate and rhythm. Normal S1 and S2. No gallop, click, or rub. No murmur. Abdomen: Bowel sounds normal; abdomen soft and nontender. No masses, organomegaly or hernias noted.                               Musculoskeletal/extremities: No deformity or scoliosis noted of  the thoracic or lumbar spine. No clubbing, cyanosis, edema, or significant extremity  deformity noted. Range of motion normal .Tone & strength normal. Hand joints reveal  minor PIP osteoarthritic changes.  Fingernail  health good. Able to lie down & sit up w/o help. Negative SLR bilaterally Vascular: Carotid, radial artery, dorsalis pedis and  posterior tibial pulses are full and equal. No bruits present. Neurologic: Alert and oriented x3. Deep tendon reflexes symmetrical and normal. She has unsustained nystagmus with lateral gaze in either direction as well as unsustained vertical nystagmus looking superiorly.  Gait normal  including heel & toe walking . Rhomberg & finger to nose  negative    Skin: Intact without suspicious lesions or rashes. Lymph: No cervical, axillary lymphadenopathy present. Psych: Mood and affect are normal. Normally interactive  Assessment & Plan:  #1 visual field deficit. No murmurs or carotid bruits. No neuro deficit. #2 hyperglycemia #3 elevated creatinine See orders

## 2013-12-16 NOTE — Patient Instructions (Signed)
Your next office appointment will be determined based upon review of your pending labs . Those instructions will be transmitted to you through My Chart .  Followup as needed for your acute issue. Please report any significant change in your symptoms. 

## 2013-12-17 ENCOUNTER — Telehealth: Payer: Self-pay | Admitting: Internal Medicine

## 2013-12-17 ENCOUNTER — Telehealth: Payer: Self-pay

## 2013-12-17 ENCOUNTER — Other Ambulatory Visit: Payer: Self-pay | Admitting: Internal Medicine

## 2013-12-17 DIAGNOSIS — R894 Abnormal immunological findings in specimens from other organs, systems and tissues: Secondary | ICD-10-CM | POA: Diagnosis not present

## 2013-12-17 DIAGNOSIS — H539 Unspecified visual disturbance: Secondary | ICD-10-CM

## 2013-12-17 DIAGNOSIS — Z1589 Genetic susceptibility to other disease: Secondary | ICD-10-CM | POA: Diagnosis not present

## 2013-12-17 DIAGNOSIS — M1990 Unspecified osteoarthritis, unspecified site: Secondary | ICD-10-CM | POA: Diagnosis not present

## 2013-12-17 DIAGNOSIS — M2550 Pain in unspecified joint: Secondary | ICD-10-CM | POA: Diagnosis not present

## 2013-12-17 NOTE — Telephone Encounter (Signed)
Pt saw Dr. Jerrye Bushy PA today for rheumatology.  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  She needs a referral to see Dr. Floyde Parkins.  That office has an appt on June 2 if she can get the referral.  [facility].

## 2013-12-17 NOTE — Telephone Encounter (Signed)
Lab results have been faxed.

## 2013-12-17 NOTE — Telephone Encounter (Signed)
Message copied by Shelly Coss on Wed Dec 17, 2013  8:19 AM ------      Message from: Hendricks Limes      Created: Wed Dec 17, 2013  6:04 AM       Please FAX lab results to Dr Rutherford Guys ------

## 2013-12-18 DIAGNOSIS — N3946 Mixed incontinence: Secondary | ICD-10-CM | POA: Diagnosis not present

## 2013-12-20 DIAGNOSIS — N390 Urinary tract infection, site not specified: Secondary | ICD-10-CM | POA: Diagnosis not present

## 2013-12-26 ENCOUNTER — Encounter (HOSPITAL_BASED_OUTPATIENT_CLINIC_OR_DEPARTMENT_OTHER): Payer: Medicare Other

## 2013-12-30 ENCOUNTER — Ambulatory Visit (INDEPENDENT_AMBULATORY_CARE_PROVIDER_SITE_OTHER): Payer: Medicare Other | Admitting: Neurology

## 2013-12-30 ENCOUNTER — Encounter: Payer: Self-pay | Admitting: Neurology

## 2013-12-30 VITALS — BP 175/81 | HR 54 | Ht 63.0 in | Wt 167.0 lb

## 2013-12-30 DIAGNOSIS — H5310 Unspecified subjective visual disturbances: Secondary | ICD-10-CM

## 2013-12-30 DIAGNOSIS — G459 Transient cerebral ischemic attack, unspecified: Secondary | ICD-10-CM | POA: Diagnosis not present

## 2013-12-30 HISTORY — DX: Unspecified subjective visual disturbances: H53.10

## 2013-12-30 NOTE — Progress Notes (Signed)
Reason for visit: Visual disturbance  [name] is a 68 y.o. female  History of present illness:  [name] is a 68 year old right-handed white female with a history of a transient visual disturbance that occurred on 12/14/2013. The patient indicates that she was watching TV at the time, and she had onset of blurring of vision of the left eye in the left homonymous visual field with some involvement of the right eye off to the left as well. The patient had some haziness, and a rainbow of colors that was subtle, but present. She also had geometric figures with the > and < signs that were within the visual field disturbance. This episode lasted about 45 minutes to 1 full hour, and then resolved, unassociated with any headache. The patient indicates that she does not have a history of migraine headache, and she has never had visual disturbances previously. The patient did not have any numbness or weakness of the face, arms, or legs, and no confusion or speech changes. This episode has not recurred since. She has been taking low-dose aspirin at the time of onset of symptoms. She is sent to this office for further evaluation.  Past Medical History  Diagnosis Date  . Hypertension   . Allergy     seasonal  . GERD (gastroesophageal reflux disease)   . Hyperlipidemia     NMR 2005  . Osteopenia     [facility]  . Hx of colonic polyps   . Diverticulosis of colon   . Interstitial cystitis   . Vitamin D deficiency   . Xerostomia     and Xeroophthalmia  . Arthritis   . Dry eye syndrome   . Osteoarthritis   . Cystitis   . Incontinence   . Ankylosing spondylitis   . HLA B27 (HLA B27 positive)   . Frozen shoulder   . Bell palsy   . Anxiety   . Osteopenia   . Subjective visual disturbance of both eyes 12/30/2013    Past Surgical History  Procedure Laterality Date  . Total abdominal hysterectomy w/ bilateral salpingoophorectomy      dysfunctional menses  .  Bladder surgery      for incontinence  . G 1 p 1    . Colonscopy      tics 10-2002  . Interstitial cystitis      with clot in catheter  . Elevated lfts      due to Elmiron  . Colonoscopy w/ polypectomy      adenomatous poyp 04-2008  . Finger surgery Left 2004    2nd finger  . Cystoscopy with retrograde pyelogram, ureteroscopy and stent placement Left 09/01/2013    Procedure: CYSTOSCOPY WITH RETROGRADE PYELOGRAM, AND LEFT STENT PLACEMENT;  Surgeon: Molli Hazard, MD;  Location: [facility];  Service: Urology;  Laterality: Left;    Family History  Problem Relation Age of Onset  . Coronary artery disease Mother   . Hypertension Mother   . Asthma Mother   . Osteoporosis Mother   . Dementia Mother   . Stroke Mother     mini cva  . Diabetes Father   . Cirrhosis Father     non alcoholic  . Cancer Sister     colon  . Colon cancer Sister 35  . Cancer Paternal Aunt     2 aunts had renal cancer  . Coronary artery disease Maternal Grandfather   . Cancer Maternal Aunt  breast  . Diabetes Paternal Aunt   . Diabetes Paternal Grandmother     Social history:  reports that she has never smoked. She has never used smokeless tobacco. She reports that she does not drink alcohol or use illicit drugs.  Medications:  Current Outpatient Prescriptions on File Prior to Visit  Medication Sig Dispense Refill  . aspirin 81 MG tablet Take 81 mg by mouth daily.        . cetirizine (ZYRTEC) 10 MG tablet Take 10 mg by mouth daily.        . Cholecalciferol (VITAMIN D-3) 5000 UNITS TABS Take by mouth daily.      . cycloSPORINE (RESTASIS) 0.05 % ophthalmic emulsion Place 1 drop into both eyes 2 (two) times daily.       . furosemide (LASIX) 40 MG tablet Take 1/2 tablet by mouth   every day  45 tablet  1  . metoprolol succinate (TOPROL-XL) 50 MG 24 hr tablet Take half tablet by mouth daily  45 tablet  1  . vitamin E 400 UNIT capsule Take 400 Units by mouth daily.       No current  facility-administered medications on file prior to visit.      Allergies  Allergen Reactions  . Nitroglycerin Anaphylaxis    Hypotensive after NTG administration in ER during chest pain evaluation  . Pentosan Polysulfate Sodium     Elevated LFTs.......Marland Kitchen elmiron   . Promethazine Hcl     "jittery on the inside"  . Lisinopril Cough  . Sulfamethoxazole-Trimethoprim Nausea Only    ROS:  Out of a complete 14 system review of symptoms, the patient complains only of the following symptoms, and all other reviewed systems are negative.  Weight gain  Hearing loss Blurred vision, double vision, loss of vision Snoring Incontinence Urination problems Feeling hot Joint pain, joint swelling, muscle cramps, achy muscles, arthritis Allergies, runny nose, skin sensitivity Numbness, dizziness Anxiety, insomnia, decreased energy Restless legs  Blood pressure 175/81, pulse 54, height $RemoveBe'5\' 3"'nrbExxxot$  (1.6 m), weight 167 lb (75.751 kg).  Physical Exam  General: The patient is alert and cooperative at the time of the examination.The patient is minimally obese.   Eyes: Pupils are equal, round, and reactive to light. Discs are flat bilaterally.  Neck: The neck is supple, no carotid bruits are noted.  Respiratory: The respiratory examination is clear.  Cardiovascular: The cardiovascular examination reveals a regular rate and rhythm, no obvious murmurs or rubs are noted.  Skin: Extremities are without significant edema.  Neurologic Exam  Mental status: The patient is alert and oriented x 3 at the time of the examination. The patient has apparent normal recent and remote memory, with an apparently normal attention span and concentration ability.  Cranial nerves: Facial symmetry is present. There is good sensation of the face to pinprick and soft touch bilaterally. The strength of the facial muscles and the muscles to head turning and shoulder shrug are normal bilaterally. Speech is well enunciated, no  aphasia or dysarthria is noted. Extraocular movements are full. Visual fields are full. The tongue is midline, and the patient has symmetric elevation of the soft palate. No obvious hearing deficits are noted.  Motor: The motor testing reveals 5 over 5 strength of all 4 extremities. Good symmetric motor tone is noted throughout.  Sensory: Sensory testing is intact to pinprick, soft touch, vibration sensation, and position sense on all 4 extremities. No evidence of extinction is noted.  Coordination: Cerebellar testing reveals good finger-nose-finger and  heel-to-shin bilaterally.  Gait and station: Gait is normal. Tandem gait is normal. Romberg is negative. No drift is seen.  Reflexes: Deep tendon reflexes are symmetric and normal bilaterally. Toes are downgoing bilaterally.   Assessment/Plan:  One. Transient subjective visual disturbance  The patient describes a left homonymous visual field event associated geometric shapes and colors. The patient likely suffered a migraine equivalent event, but she does not have a prior history of migraine. She will undergo MRI evaluation of the brain, and MRA of the head. A carotid doppler study will be done. If the above studies are unremarkable, no further workup will be undertaken. She is to continue aspirin therapy.   Jill Alexanders MD 12/30/2013 7:24 PM  [facility] [address] [place], [place] [address]  Phone [phone] Fax [phone]

## 2013-12-30 NOTE — Patient Instructions (Signed)
Visual Disturbances  You have had a disturbance in your vision. This may be caused by various conditions, such as:   Migraines. Migraine headaches are often preceded by a disturbance in vision. Blind spots or light flashes are followed by a headache. This type of visual disturbance is temporary. It does not damage the eye.   Glaucoma. This is caused by increased pressure in the eye. Symptoms include haziness, blurred vision, or seeing rainbow colored circles when looking at bright lights. Partial or complete visual loss can occur. You may or may not experience eye pain. Visual loss may be gradual or sudden and is irreversible. Glaucoma is the leading cause of blindness.   Retina problems. Vision will be reduced if the retina becomes detached or if there is a circulation problem as with diabetes, high blood pressure, or a mini-stroke. Symptoms include seeing "floaters," flashes of light, or shadows, as if a curtain has fallen over your eye.   Optic nerve problems. The main nerve in your eye can be damaged by redness, soreness, and swelling (inflammation), poor circulation, drugs, and toxins.  It is very important to have a complete exam done by a specialist to determine the exact cause of your eye problem. The specialist may recommend medicines or surgery, depending on the cause of the problem. This can help prevent further loss of vision or reduce the risk of having a stroke. Contact the caregiver to whom you have been referred and arrange for follow-up care right away.  SEEK IMMEDIATE MEDICAL CARE IF:    Your vision gets worse.   You develop severe headaches.   You have any weakness or numbness in the face, arms, or legs.   You have any trouble speaking or walking.  Document Released: 08/24/2004 Document Revised: 10/09/2011 Document Reviewed: 12/15/2009  [employer] Patient Information 2014 [employer].

## 2013-12-31 DIAGNOSIS — N390 Urinary tract infection, site not specified: Secondary | ICD-10-CM | POA: Diagnosis not present

## 2014-01-01 ENCOUNTER — Other Ambulatory Visit: Payer: Self-pay | Admitting: Internal Medicine

## 2014-01-02 ENCOUNTER — Ambulatory Visit (HOSPITAL_BASED_OUTPATIENT_CLINIC_OR_DEPARTMENT_OTHER): Payer: Medicare Other | Attending: Pulmonary Disease

## 2014-01-02 VITALS — Ht 63.0 in | Wt 165.0 lb

## 2014-01-02 DIAGNOSIS — G4733 Obstructive sleep apnea (adult) (pediatric): Secondary | ICD-10-CM | POA: Diagnosis not present

## 2014-01-02 DIAGNOSIS — Z79899 Other long term (current) drug therapy: Secondary | ICD-10-CM | POA: Insufficient documentation

## 2014-01-02 DIAGNOSIS — Z7982 Long term (current) use of aspirin: Secondary | ICD-10-CM | POA: Insufficient documentation

## 2014-01-02 DIAGNOSIS — R0989 Other specified symptoms and signs involving the circulatory and respiratory systems: Secondary | ICD-10-CM | POA: Insufficient documentation

## 2014-01-02 DIAGNOSIS — R0609 Other forms of dyspnea: Secondary | ICD-10-CM | POA: Diagnosis not present

## 2014-01-05 ENCOUNTER — Ambulatory Visit (INDEPENDENT_AMBULATORY_CARE_PROVIDER_SITE_OTHER): Payer: Medicare Other | Admitting: Internal Medicine

## 2014-01-05 ENCOUNTER — Encounter: Payer: Self-pay | Admitting: Internal Medicine

## 2014-01-05 VITALS — BP 112/70 | HR 62 | Ht 63.0 in | Wt 169.6 lb

## 2014-01-05 DIAGNOSIS — J309 Allergic rhinitis, unspecified: Secondary | ICD-10-CM | POA: Diagnosis not present

## 2014-01-05 DIAGNOSIS — J302 Other seasonal allergic rhinitis: Secondary | ICD-10-CM

## 2014-01-05 DIAGNOSIS — J3089 Other allergic rhinitis: Principal | ICD-10-CM

## 2014-01-05 DIAGNOSIS — H1045 Other chronic allergic conjunctivitis: Secondary | ICD-10-CM

## 2014-01-05 DIAGNOSIS — K219 Gastro-esophageal reflux disease without esophagitis: Secondary | ICD-10-CM

## 2014-01-05 DIAGNOSIS — G459 Transient cerebral ischemic attack, unspecified: Secondary | ICD-10-CM | POA: Diagnosis not present

## 2014-01-05 DIAGNOSIS — Z91018 Allergy to other foods: Secondary | ICD-10-CM | POA: Diagnosis not present

## 2014-01-05 DIAGNOSIS — H1010 Acute atopic conjunctivitis, unspecified eye: Secondary | ICD-10-CM

## 2014-01-05 MED ORDER — AZELASTINE-FLUTICASONE 137-50 MCG/ACT NA SUSP: 1.0000 | Freq: Every day | NASAL | Status: DC

## 2014-01-05 NOTE — Progress Notes (Signed)
12/04/13- 67 yoF never smoker Self referral for allergy consult.  Pt has not had an allergy test since 1970's, is interested in both environmental and food allergy testing.   Being followed by Dr Halford Chessman for OSA She questions allergic rhinitis. Seasonal head congestion, eyes itching, sneezing and postnasal drip are worst in early spring and in the fall. House dust is also a trigger but she blames pollens. Allergy skin testing in the 1970s and was on allergy vaccine for one year at that time. No ENT surgery and never diagnosed with asthma although she has heard little wheezing a few times. Foods- beans and pork cause stomach ache. She eats mostly chicken and fish. 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 No skin allergy problems but she chooses hypoallergenic products. Insect stings cause large local reactions. No problems with latex, contrast dye or aspirin. No history of nasal polyps. Environment-house-no mold, no basement, no pets, no smokers. Family-2 sisters had asthma /on allergy vaccine, mother had asthma, daughter has mild asthma.  01/05/14- 67 yoF never smoker Self referral for allergy consult.  Pt has not had an allergy test since 1970's, is interested in both environmental and food allergy testing.  Followed for Dr Halford Chessman for OSA FOLLOWS FOR: Pt states she has an increase in rhinorrhea, sneezing, cough and worsens at night when lying down. C/o mild midsternal chest discomfort. Denies SOB. Allergy Profile: 12/04/2013-total IgE 169.9 with increased levels for dust mites, cat and some grass weed and tree pollens Food allergy profile-elevated for peanut, tomato, apple  ROS-see HPI Constitutional:   No-   weight loss, night sweats, fevers, chills, fatigue, lassitude. HEENT:   No-  headaches, difficulty swallowing, tooth/dental problems, sore throat,       +sneezing,+ itching, ear ache, +nasal congestion, +post nasal drip,  CV:  No-   chest pain, orthopnea, PND,  swelling in lower extremities, anasarca,                                              dizziness, palpitations Resp: No-   shortness of breath with exertion or at rest.              No-   productive cough,  No non-productive cough,  No- coughing up of blood.              No-   change in color of mucus.  No- wheezing.   Skin: No-   rash or lesions. GI:  No-   heartburn, indigestion, abdominal pain, nausea, vomiting,  GU:  MS:  No-   joint pain or swelling.  . Neuro-     nothing unusual Psych:  No- change in mood or affect. No depression or anxiety.  No memory loss.  OBJ- Physical Exam General- Alert, Oriented, Affect-appropriate, Distress- none acute Skin- rash-none, lesions- none, excoriation- none Lymphadenopathy- none Head- atraumatic            Eyes- Gross vision intact, PERRLA, conjunctivae and secretions clear            Ears- Hearing, canals-normal            Nose- +Turbinate edema, Septal dev+ , mucus, polyps, erosion, perforation             Throat- Mallampati IV , mucosa clear , drainage- none, tonsils- atrophic Neck- flexible , trachea midline,  no stridor , thyroid nl, carotid no bruit Chest - symmetrical excursion , unlabored           Heart/CV- RRR , no murmur , no gallop  , no rub, nl s1 s2                           - JVD- none , edema- none, stasis changes- none, varices- none           Lung- clear to P&A, wheeze- none, cough- none , dullness-none, rub- none           Chest wall-  Abd- Br/ Gen/ Rectal- Not done, not indicated Extrem- cyanosis- none, clubbing, none, atrophy- none, strength- nl Neuro- grossly intact to observation

## 2014-01-05 NOTE — Patient Instructions (Signed)
Avoid foods that seem to cause problems. The food allergy tests can serve as a watch list  Sample Dymista nasal spray  1-2 puffs each nostril once daily at bedtime

## 2014-01-06 ENCOUNTER — Telehealth: Payer: Self-pay | Admitting: Pulmonary Disease

## 2014-01-06 DIAGNOSIS — G4733 Obstructive sleep apnea (adult) (pediatric): Secondary | ICD-10-CM | POA: Diagnosis not present

## 2014-01-06 NOTE — Telephone Encounter (Signed)
PSG 01/02/14 >> AHI 5.1 SpO2 low 77%.  REM AHI 19.  Will have my nurse schedule ROV to review results.

## 2014-01-06 NOTE — Sleep Study (Signed)
[place]  NAME: [name] DATE OF BIRTH:  November 20, 1945 MEDICAL RECORD NUMBER [id number]  LOCATION: [facility]  PHYSICIAN: Chesley Mires, M.D. DATE OF STUDY: 01/02/2014  SLEEP STUDY TYPE: Polysomnogram               REFERRING PHYSICIAN: Chesley Mires, MD  INDICATION FOR STUDY:  [name] is a 68 y.o. female who presents to the sleep lab for evaluation of hypersomnia with obstructive sleep apnea.  She reports snoring, sleep disruption, apnea, and daytime sleepiness.  EPWORTH SLEEPINESS SCORE: 8. HEIGHT: 5\' 3"  (160 cm)  WEIGHT: 165 lb (74.844 kg)    Body mass index is 29.24 kg/(m^2).  NECK SIZE: 14 in.  MEDICATIONS:  Current Outpatient Prescriptions on File Prior to Visit  Medication Sig Dispense Refill  . aspirin 81 MG tablet Take 81 mg by mouth daily.        . cetirizine (ZYRTEC) 10 MG tablet Take 10 mg by mouth daily.        . Cholecalciferol (VITAMIN D-3) 5000 UNITS TABS Take by mouth daily.      . cycloSPORINE (RESTASIS) 0.05 % ophthalmic emulsion Place 1 drop into both eyes 2 (two) times daily.       . furosemide (LASIX) 40 MG tablet TAKE ONE-HALF (1/2) TABLET EVERY DAY  45 tablet  1  . metoprolol succinate (TOPROL-XL) 50 MG 24 hr tablet Take half tablet by mouth daily  45 tablet  1  . vitamin E 400 UNIT capsule Take 400 Units by mouth daily.       No current facility-administered medications on file prior to visit.    SLEEP ARCHITECTURE:  Total recording time: 401 minutes.  Total sleep time was: 355 minutes.  Sleep efficiency: 88.5%.  Sleep latency: 7.5 minutes.  REM latency: 99 minutes.  Stage N1: 8.3%.  Stage N2: 74.8%.  Stage N3: 0%.  Stage R:  16.9%.  Supine sleep: 355 minutes.  Non-supine sleep: 0 minutes.  CARDIAC DATA:  Average heart rate: 66 beats per minute. Rhythm strip: sinus rhythm.  RESPIRATORY DATA: Average respiratory rate: 16. Snoring: loud. Average AHI: 5.1.   Apnea index: 1.9.  Hypopnea index:  3.2. Obstructive apnea index: 1.9.  Central apnea index: 0.  Mixed apnea index: 0. REM AHI: 19.  NREM AHI: 2.2. Supine AHI: 4.5. Non-supine AHI: 0.  MOVEMENT/PARASOMNIA:  Periodic limb movement: 0.  Period limb movements with arousals: 0. Restroom trips: one.  OXYGEN DATA:  Baseline oxygenation: 91%. Lowest SaO2: 77%. Time spent below SaO2 90%: 87.2 minutes. Supplemental oxygen used: none.  IMPRESSION/ RECOMMENDATION:   This study shows mild obstructive sleep apnea with an AHI of 5.1 and SaO2 low of 77%.  She did have significant REM effect.   Additional therapies include weight loss, CPAP, oral appliance, or surgical evaluation.   Chesley Mires, M.D. Diplomate, Tax adviser of Sleep Medicine  ELECTRONICALLY SIGNED ON:  01/06/2014, 5:14 PM [place] PH: [phone]   FX: [phone] [place]

## 2014-01-07 NOTE — Telephone Encounter (Signed)
Pt scheduled for appt with VS 01/14/14 at 1:30 to discuss sleep study results.   Nothing further needed.

## 2014-01-08 DIAGNOSIS — N3946 Mixed incontinence: Secondary | ICD-10-CM | POA: Diagnosis not present

## 2014-01-08 DIAGNOSIS — M62838 Other muscle spasm: Secondary | ICD-10-CM | POA: Diagnosis not present

## 2014-01-08 DIAGNOSIS — M6281 Muscle weakness (generalized): Secondary | ICD-10-CM | POA: Diagnosis not present

## 2014-01-08 DIAGNOSIS — R279 Unspecified lack of coordination: Secondary | ICD-10-CM | POA: Diagnosis not present

## 2014-01-10 ENCOUNTER — Ambulatory Visit
Admission: RE | Admit: 2014-01-10 | Discharge: 2014-01-10 | Disposition: A | Discharge status: Home / Self Care | Payer: Medicare Other | Source: Ambulatory Visit | Attending: Neurology | Admitting: Neurology

## 2014-01-10 DIAGNOSIS — H5310 Unspecified subjective visual disturbances: Secondary | ICD-10-CM

## 2014-01-10 DIAGNOSIS — G459 Transient cerebral ischemic attack, unspecified: Secondary | ICD-10-CM

## 2014-01-12 ENCOUNTER — Telehealth: Payer: Self-pay | Admitting: Neurology

## 2014-01-12 NOTE — Telephone Encounter (Signed)
I called patient. MRI the brain shows minimal small vessel changes, MRA of head is unremarkable. The patient is on low-dose aspirin. She is to stay on this. Carotid Doppler studies are pending, I will call when I get the results.   MRI head 01/11/14:  Impression   Abnormal MRI scan of the brain showing mild generalized  cerebral atrophy and changes of chronic microvascular ischemia.    MRA head 01/13/2014:  IMPRESSION: Normal MRA of the brain showing no significant stenosis of the medium and large size intracranial vessels. 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

## 2014-01-14 ENCOUNTER — Encounter: Payer: Self-pay | Admitting: Pulmonary Disease

## 2014-01-14 ENCOUNTER — Ambulatory Visit (INDEPENDENT_AMBULATORY_CARE_PROVIDER_SITE_OTHER): Payer: Medicare Other | Admitting: Pulmonary Disease

## 2014-01-14 VITALS — BP 138/86 | HR 64 | Ht 63.0 in | Wt 170.8 lb

## 2014-01-14 DIAGNOSIS — G4733 Obstructive sleep apnea (adult) (pediatric): Secondary | ICD-10-CM | POA: Diagnosis not present

## 2014-01-14 NOTE — Patient Instructions (Signed)
Will arrange for referral to Dr. Oneal Grout to get fitted for oral appliance to treat obstructive sleep apnea Follow up in 6 months

## 2014-01-14 NOTE — Assessment & Plan Note (Signed)
She has mild sleep apnea, but moderate in REM sleep.  I have reviewed the recent sleep study results with the patient.  We discussed how sleep apnea can affect various health problems including risks for hypertension, cardiovascular disease, and diabetes.  We also discussed how sleep disruption can increase risks for accident, such as while driving.  Weight loss as a means of improving sleep apnea was also reviewed.  Additional treatment options discussed were CPAP therapy, oral appliance, and surgical intervention.  She would like to pursue oral appliance.  Will arrange for referral to Dr. Oneal Grout.

## 2014-01-14 NOTE — Progress Notes (Signed)
Chief Complaint  Patient presents with  . Follow-up    Review sleep study.    History of Present Illness: [name] is a 68 y.o. female with OSA.  She is here to review her sleep study.  This showed mild sleep apnea.  TESTS: PSG 01/02/14 >> AHI 5.1 SpO2 low 77%. REM AHI 19.   [name]  has a past medical history of Hypertension; Allergy; GERD (gastroesophageal reflux disease); Hyperlipidemia; Osteopenia; colonic polyps; Diverticulosis of colon; Interstitial cystitis; Vitamin D deficiency; Xerostomia; Arthritis; Dry eye syndrome; Osteoarthritis; Cystitis; Incontinence; Ankylosing spondylitis; HLA B27 (HLA B27 positive); Frozen shoulder; Bell palsy; Anxiety; Osteopenia; and Subjective visual disturbance of both eyes (12/30/2013).  [name]  has past surgical history that includes Total abdominal hysterectomy w/ bilateral salpingoophorectomy; Bladder surgery; G 1 P 1; colonscopy; interstitial cystitis; elevated LFTs; Colonoscopy w/ polypectomy; Finger surgery (Left, 2004); and Cystoscopy with retrograde pyelogram, ureteroscopy and stent placement (Left, 09/01/2013).  Prior to Admission medications   Medication Sig Start Date End Date Taking? Authorizing Provider  aspirin 81 MG tablet Take 81 mg by mouth daily.     Yes Historical Provider, MD  cetirizine (ZYRTEC) 10 MG tablet Take 10 mg by mouth daily.     Yes Historical Provider, MD  Cholecalciferol (VITAMIN D-3) 5000 UNITS TABS Take by mouth daily.   Yes Historical Provider, MD  cycloSPORINE (RESTASIS) 0.05 % ophthalmic emulsion Place 1 drop into both eyes 2 (two) times daily.    Yes Historical Provider, MD  ibuprofen (ADVIL,MOTRIN) 800 MG tablet Take 800 mg by mouth 3 (three) times daily as needed.   Yes Historical Provider, MD  metoprolol succinate (TOPROL-XL) 50 MG 24 hr tablet Take half tablet by mouth daily 07/16/13  Yes Hendricks Limes, MD  vitamin E 400 UNIT capsule Take 400 Units by mouth daily.   Yes Historical  Provider, MD  furosemide (LASIX) 40 MG tablet TAKE ONE-HALF (1/2) TABLET EVERY DAY 01/01/14   Hendricks Limes, MD    Allergies  Allergen Reactions  . Nitroglycerin Anaphylaxis    Hypotensive after NTG administration in ER during chest pain evaluation  . Pentosan Polysulfate Sodium     Elevated LFTs.......Marland Kitchen elmiron   . Promethazine Hcl     "jittery on the inside"  . Lisinopril Cough  . Sulfamethoxazole-Trimethoprim Nausea Only     Physical Exam:  General - No distress ENT - No sinus tenderness, no oral exudate, no LAN Cardiac - s1s2 regular, no murmur Chest - No wheeze/rales/dullness Back - No focal tenderness Abd - Soft, non-tender Ext - No edema Neuro - Normal strength Skin - No rashes Psych - normal mood, and behavior   Assessment/Plan:  Chesley Mires, MD [place] [facility] Pager:  [phone]

## 2014-01-15 ENCOUNTER — Ambulatory Visit (INDEPENDENT_AMBULATORY_CARE_PROVIDER_SITE_OTHER): Payer: Medicare Other

## 2014-01-15 DIAGNOSIS — H5310 Unspecified subjective visual disturbances: Secondary | ICD-10-CM | POA: Diagnosis not present

## 2014-01-20 ENCOUNTER — Telehealth: Payer: Self-pay | Admitting: Neurology

## 2014-01-20 NOTE — Telephone Encounter (Signed)
I called the patient. The carotid doppler study is normal. I'll see the patient back if needed. The visual disturbance reported by the patient likely represent migraine.

## 2014-01-22 DIAGNOSIS — H2510 Age-related nuclear cataract, unspecified eye: Secondary | ICD-10-CM | POA: Diagnosis not present

## 2014-01-22 DIAGNOSIS — H26019 Infantile and juvenile cortical, lamellar, or zonular cataract, unspecified eye: Secondary | ICD-10-CM | POA: Diagnosis not present

## 2014-01-22 DIAGNOSIS — H04129 Dry eye syndrome of unspecified lacrimal gland: Secondary | ICD-10-CM | POA: Diagnosis not present

## 2014-01-24 ENCOUNTER — Other Ambulatory Visit: Payer: Self-pay | Admitting: Internal Medicine

## 2014-01-29 ENCOUNTER — Telehealth: Payer: Self-pay | Admitting: Neurology

## 2014-01-29 ENCOUNTER — Ambulatory Visit (INDEPENDENT_AMBULATORY_CARE_PROVIDER_SITE_OTHER): Payer: Medicare Other | Admitting: Internal Medicine

## 2014-01-29 ENCOUNTER — Encounter: Payer: Self-pay | Admitting: Internal Medicine

## 2014-01-29 VITALS — BP 134/90 | HR 90 | Temp 98.0°F | Wt 169.2 lb

## 2014-01-29 DIAGNOSIS — R609 Edema, unspecified: Secondary | ICD-10-CM

## 2014-01-29 DIAGNOSIS — M7580 Other shoulder lesions, unspecified shoulder: Secondary | ICD-10-CM

## 2014-01-29 DIAGNOSIS — M25819 Other specified joint disorders, unspecified shoulder: Secondary | ICD-10-CM | POA: Diagnosis not present

## 2014-01-29 DIAGNOSIS — N200 Calculus of kidney: Secondary | ICD-10-CM

## 2014-01-29 DIAGNOSIS — G459 Transient cerebral ischemic attack, unspecified: Secondary | ICD-10-CM

## 2014-01-29 DIAGNOSIS — I10 Essential (primary) hypertension: Secondary | ICD-10-CM | POA: Diagnosis not present

## 2014-01-29 DIAGNOSIS — M7542 Impingement syndrome of left shoulder: Secondary | ICD-10-CM

## 2014-01-29 MED ORDER — TRAMADOL HCL 50 MG PO TABS: 50.0000 mg | ORAL_TABLET | Freq: Three times a day (TID) | ORAL | Status: DC | PRN

## 2014-01-29 MED ORDER — METOPROLOL SUCCINATE ER 50 MG PO TB24: ORAL_TABLET | ORAL | Status: DC

## 2014-01-29 NOTE — Progress Notes (Signed)
Pre visit review using our clinic review tool, if applicable. No additional management support is needed unless otherwise documented below in the visit note. 

## 2014-01-29 NOTE — Telephone Encounter (Signed)
Spoke with patient and shared Dr Tobey Grim message(01/12/14) of MRI results, she verbalized understanding and said that she did not get his message unless it was on her cell VM, said that she will check and if she has additional questions, will call back

## 2014-01-29 NOTE — Progress Notes (Signed)
   Subjective:    Patient ID: [name], female    DOB: 10/08/1945, 68 y.o.   MRN: [id number]  HPI  She had renal calculi in January and she's been seeing a urologist; he will be leaving soon. She will establish with Dr. Risa Grill.  To date she has not actually passed a stone.  She stopped her furosemide questioning that this might increase her risk for renal calculi. Off furosemide she has had intermittent swelling of ankles. She does not avoid salt.  She's having daily "twinges" in her back which have variable duration & severity of 3-6 on 10 scale.  She's also continues to drink tea.  She's had some nausea the last 2 days intermittently.  She also needs a refill for her beta blocker.   Review of Systems  She denies dysuria, pyuria, hematuria, frequency, nocturia or polyuria. Unexplained weight loss, abdominal pain, significant dyspepsia, dysphagia, melena, rectal bleeding, or persistently small caliber stools are denied.   She has been taking 800 mg ibuprofen up to 3 times a day for her left shoulder pain. She questions whether this might have a negative impact on her kidney function.     Objective:   Physical Exam General appearance is one of good health and nourishment w/o distress.  Eyes: No conjunctival inflammation or scleral icterus is present.  Oral exam: Dental hygiene is good; lips and gums are healthy appearing.There is no oropharyngeal erythema or exudate noted.   Heart:  Normal rate and regular rhythm. S1 and S2 normal without gallop, murmur, click, rub or other extra sounds     Lungs:Chest clear to auscultation; no wheezes, rhonchi,rales ,or rubs present.No increased work of breathing.   Abdomen: bowel sounds normal, soft and non-tender without masses, organomegaly or hernias noted.  No guarding or rebound . Slight tenderness over the flanks to percussion  Musculoskeletal: Able to lie flat and sit up without help. Negative straight leg raising bilaterally.  Gait normal  Skin:Warm & dry.  Intact without suspicious lesions or rashes ; no jaundice or tenting  Lymphatic: No lymphadenopathy is noted about the head, neck, axilla             Assessment & Plan:  #1 renal calculi #2 edema #3 hypertension #4 shoulder impingement syndrome  Plan: Tramadol prescribed the shoulder pain to reduce potential long-term gastric or cardiac risk related to nonsteroidals . This was discussed

## 2014-01-29 NOTE — Progress Notes (Signed)
   Subjective:    Patient ID: [name], female    DOB: 03-06-1946, 68 y.o.   MRN: [id number]  HPI Patient is here today to follow-up on medications and discuss seeing a nephrologist. She had kidney stones back in Jan. And took her self off of furosemide because she was reading the labels. She has noticed some intermittent swelling of her ankles and wonders if she should restart. She is having some nausea that started last night after dinner and has persisted today. She has "twinges and squeezing pain" in bilateral flank areas.   She has not been avoiding salt or tea. She is having CVA tenderness on the left. She states the "squeezing" is 3-6/10 in her bilateral flanks.   Review of Systems Denies dysuria, hematuria, pyuria Denies diarrhea/vomiting, GI pain/tenderness/discomfort. Denies chest pains, SOB, DOE    Objective:   Physical Exam  Constitutional: She is oriented to person, place, and time. She appears well-nourished. No distress.  HENT:  Head: Normocephalic.  Mouth/Throat: No oropharyngeal exudate.  Eyes: Conjunctivae and EOM are normal. Pupils are equal, round, and reactive to light.  Neck: Normal range of motion. Neck supple. No thyromegaly present.  Cardiovascular: Regular rhythm, normal heart sounds and intact distal pulses.  Exam reveals no gallop and no friction rub.   No murmur heard. Pulmonary/Chest: Effort normal and breath sounds normal. No respiratory distress. She has no wheezes. She exhibits no tenderness.  Abdominal: Soft. Bowel sounds are normal. She exhibits no distension and no mass. There is no tenderness. There is no rebound and no guarding.  Musculoskeletal: Normal range of motion. She exhibits edema. She exhibits no tenderness.  Ankles bilaterally non-pitting mild  Neurological: She is alert and oriented to person, place, and time. She has normal reflexes. She displays normal reflexes. No cranial nerve deficit. She exhibits normal muscle tone. Coordination  normal.  Skin: Skin is warm and dry. No rash noted. She is not diaphoretic. No erythema. No pallor.  Psychiatric: She has a normal mood and affect. Her behavior is normal. Judgment and thought content normal.          Assessment & Plan:

## 2014-01-29 NOTE — Telephone Encounter (Signed)
Patient calling to state that she can view her MRI results online and got nervous because she saw the word "abnormal" and hasn't heard anything from Dr. Jannifer Franklin yet. Please return call to patient and advise.

## 2014-01-29 NOTE — Patient Instructions (Signed)
Use an anti-inflammatory cream such as Aspercreme or Zostrix cream twice a day to the affected area as needed. In lieu of this warm moist compresses or  hot water bottle can be used. Do not apply ice .  Avoid ingestion of  excess salt/sodium.Cook with pepper & other spices . Use the salt substitute "No Salt" OR the Mrs Deliah Boston products to season food @ the table. Avoid foods which taste salty or "vinegary" as their sodium content will be high.

## 2014-01-30 DIAGNOSIS — N200 Calculus of kidney: Secondary | ICD-10-CM | POA: Insufficient documentation

## 2014-02-02 MED ORDER — METOPROLOL SUCCINATE ER 50 MG PO TB24: ORAL_TABLET | ORAL | Status: DC

## 2014-02-02 NOTE — Addendum Note (Signed)
Addended by: Roma Schanz R on: 02/02/2014 08:13 AM   Modules accepted: Orders

## 2014-02-18 DIAGNOSIS — R1031 Right lower quadrant pain: Secondary | ICD-10-CM | POA: Diagnosis not present

## 2014-03-06 DIAGNOSIS — Z91018 Allergy to other foods: Secondary | ICD-10-CM | POA: Insufficient documentation

## 2014-03-06 NOTE — Assessment & Plan Note (Signed)
Compared dry eye with allergic symptoms. Discussed available eyedrop products

## 2014-03-06 NOTE — Assessment & Plan Note (Signed)
Plan-educated dust and pollen environmental precautions, sample Dymista

## 2014-03-06 NOTE — Assessment & Plan Note (Signed)
Strongly suggestive history that cough is worse lying down. Emphasis on reflux precautions

## 2014-03-06 NOTE — Assessment & Plan Note (Signed)
Watch relationship to her GERD symptoms and possible eosinophilic esophagitis Avoid only those foods which cause symptoms. We agreed to wait on EpiPen for now

## 2014-03-10 DIAGNOSIS — R109 Unspecified abdominal pain: Secondary | ICD-10-CM | POA: Diagnosis not present

## 2014-03-10 DIAGNOSIS — N3010 Interstitial cystitis (chronic) without hematuria: Secondary | ICD-10-CM | POA: Diagnosis not present

## 2014-03-10 DIAGNOSIS — N393 Stress incontinence (female) (male): Secondary | ICD-10-CM | POA: Diagnosis not present

## 2014-03-17 DIAGNOSIS — F411 Generalized anxiety disorder: Secondary | ICD-10-CM | POA: Diagnosis not present

## 2014-03-24 ENCOUNTER — Encounter: Payer: Self-pay | Admitting: Internal Medicine

## 2014-03-24 ENCOUNTER — Ambulatory Visit (INDEPENDENT_AMBULATORY_CARE_PROVIDER_SITE_OTHER): Payer: Medicare Other | Admitting: Internal Medicine

## 2014-03-24 VITALS — BP 138/88 | HR 69 | Temp 98.2°F | Wt 172.5 lb

## 2014-03-24 DIAGNOSIS — M899 Disorder of bone, unspecified: Secondary | ICD-10-CM | POA: Diagnosis not present

## 2014-03-24 DIAGNOSIS — M949 Disorder of cartilage, unspecified: Secondary | ICD-10-CM | POA: Diagnosis not present

## 2014-03-24 DIAGNOSIS — J310 Chronic rhinitis: Secondary | ICD-10-CM

## 2014-03-24 NOTE — Progress Notes (Signed)
Pre visit review using our clinic review tool, if applicable. No additional management support is needed unless otherwise documented below in the visit note. 

## 2014-03-24 NOTE — Patient Instructions (Signed)
Zicam Melts or Zinc lozenges as per package label for sore throat . Complementary options include  vitamin C 2000 mg daily; & Echinacea for 4-7 days. Report persistent or progressive fever; discolored nasal or chest secretions; or frontal headache or facial  pain.      Plain Mucinex (NOT D) for thick secretions ;force NON dairy fluids .   Nasal cleansing in the shower as discussed with lather of mild shampoo.After 10 seconds wash off lather while  exhaling through nostrils. Make sure that all residual soap is removed to prevent irritation.  Flonase OR Nasacort AQ 1 spray in each nostril twice a day as needed. Use the "crossover" technique into opposite nostril spraying toward opposite ear @ 45 degree angle, not straight up into nostril.  Use a Neti pot daily only  as needed for significant sinus congestion; going from open side to congested side . Plain Allegra (NOT D )  160 daily , Loratidine 10 mg , OR Zyrtec 10 mg @ bedtime  as needed for itchy eyes & sneezing.

## 2014-03-24 NOTE — Progress Notes (Signed)
   Subjective:    Patient ID: [name], female    DOB: 1946/06/26, 68 y.o.   MRN: [id number]  HPI   Symptoms began approximately one week ago as aching in her ears, bridge of the nose, and maxillary sinus areas. She describes popping in her ears and congestion. She's had some chills and sweats. She questions enlarged cervical lymphadenopathy.  She has minor cough which is nonproductive and not associated with wheezing shortness of breath.  She's had some nausea for which she took Zofran several times. She questions some food dysphagia.  She also questions whether she is due for bone density ; she believes her last study was in January 2013.    Review of Systems Frontal headache, nasal purulence, dental pain,  or otic discharge denied. No fever , chills or sweats.  She is not having significant extrinsic symptoms.       Objective:   Physical Exam General appearance:good health ;well nourished; no acute distress or increased work of breathing is present.  No  lymphadenopathy about the head, neck, or axilla noted.   Eyes: No conjunctival inflammation or lid edema is present. There is no scleral icterus.  Ears:  External ear exam shows no significant lesions or deformities.  Otoscopic examination reveals clear canals, tympanic membranes are intact bilaterally without bulging, retraction, inflammation or discharge.  Nose:  External nasal examination shows no deformity or inflammation. Nasal mucosa are pink and moist without lesions or exudates. No septal dislocation or deviation.No obstruction to airflow.   Oral exam: Dental hygiene is good; lips and gums are healthy appearing.There is no oropharyngeal erythema or exudate noted.   Neck:  No deformities, thyromegaly, masses, or tenderness noted.   Supple with full range of motion without pain.   Heart:  Normal rate and regular rhythm. S1 and S2 normal without gallop, murmur, click, rub or other extra sounds.   Lungs:Chest clear to  auscultation; no wheezes, rhonchi,rales ,or rubs present.No increased work of breathing.    Extremities:  No cyanosis, edema, or clubbing  noted    Skin: Warm & dry w/o jaundice or tenting.         Assessment & Plan:  #1 non allergic rhinitis The [facility] criteria for pharyngitis which include fever, pharyngeal exudate, tender cervical  lymphadenopathy , and absence of cough were negative  See orders & AVS

## 2014-04-08 ENCOUNTER — Encounter: Payer: Self-pay | Admitting: Internal Medicine

## 2014-04-08 ENCOUNTER — Ambulatory Visit (INDEPENDENT_AMBULATORY_CARE_PROVIDER_SITE_OTHER): Payer: Medicare Other | Admitting: Internal Medicine

## 2014-04-08 VITALS — BP 130/82 | HR 69 | Temp 98.7°F | Resp 15 | Ht 62.0 in | Wt 170.0 lb

## 2014-04-08 DIAGNOSIS — I10 Essential (primary) hypertension: Secondary | ICD-10-CM

## 2014-04-08 DIAGNOSIS — Z9079 Acquired absence of other genital organ(s): Secondary | ICD-10-CM

## 2014-04-08 DIAGNOSIS — G5603 Carpal tunnel syndrome, bilateral upper limbs: Secondary | ICD-10-CM

## 2014-04-08 DIAGNOSIS — Z90710 Acquired absence of both cervix and uterus: Secondary | ICD-10-CM

## 2014-04-08 DIAGNOSIS — N201 Calculus of ureter: Secondary | ICD-10-CM

## 2014-04-08 DIAGNOSIS — G5600 Carpal tunnel syndrome, unspecified upper limb: Secondary | ICD-10-CM

## 2014-04-08 DIAGNOSIS — J3089 Other allergic rhinitis: Secondary | ICD-10-CM

## 2014-04-08 DIAGNOSIS — R32 Unspecified urinary incontinence: Secondary | ICD-10-CM | POA: Diagnosis not present

## 2014-04-08 DIAGNOSIS — E782 Mixed hyperlipidemia: Secondary | ICD-10-CM

## 2014-04-08 DIAGNOSIS — J309 Allergic rhinitis, unspecified: Secondary | ICD-10-CM

## 2014-04-08 DIAGNOSIS — Z90722 Acquired absence of ovaries, bilateral: Secondary | ICD-10-CM

## 2014-04-08 DIAGNOSIS — J302 Other seasonal allergic rhinitis: Secondary | ICD-10-CM

## 2014-04-08 NOTE — Progress Notes (Signed)
Subjective:    Patient ID: [name], female    DOB: 05/19/1946, 68 y.o.   MRN: [id number]  HPI [name] is here new pt for first visit .   She is a retired Oncologist at [employer]  She has extensive PMH indluding,  HTN, hyperlipidemia, OSA, allergic rhintis, DJD,  GERD, bilateral  carpal tunnel,  Osteopenia, diverticulosis,  Right renal calculus,  Urinary incontinence and dry eye syndrome.    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   She is also wondering if she should take Zoloft .  Her gyn NP prescirbed Zoloft 25 mg but she has not filled this.   She deneis depressed mood.  She describes she has trouble sleeping through the night  Her husband has been very ill over last two years, had a an elective surgery complicated by MI and seizure disorder.     She describes herself as "worrying a lot" .   The reason she does not sleep well is that she is hyprvigilant, when her husbaned moves in the bed, she will wake up.   No S/H ideation /plan/ or intent.   She really does not want to take RX meds for this  She also intends to have the [place] vaginal treatment at [facility]    Allergies  Allergen Reactions  . Nitroglycerin Anaphylaxis    Hypotensive after NTG administration in ER during chest pain evaluation  . Pentosan Polysulfate Sodium     Elevated LFTs.......Marland Kitchen elmiron   . Promethazine Hcl     "jittery on the inside"  . Lisinopril Cough  . Sulfamethoxazole-Trimethoprim Nausea Only   Past Medical History  Diagnosis Date  . Hypertension   . Allergy     seasonal  . GERD (gastroesophageal reflux disease)   . Hyperlipidemia     NMR 2005  . Osteopenia     [facility]  . Hx of colonic polyps   . Diverticulosis of colon   . Interstitial cystitis   . Vitamin D deficiency   . Xerostomia     and  Xeroophthalmia  . Arthritis   . Dry eye syndrome   . Osteoarthritis   . Cystitis   . Incontinence   . Ankylosing spondylitis   . HLA B27 (HLA B27 positive)   . Frozen shoulder   . Bell palsy   . Anxiety   . Osteopenia   . Subjective visual disturbance of both eyes 12/30/2013  . Menopause   . Sleep apnea   . CTS (carpal tunnel syndrome)    Past Surgical History  Procedure Laterality Date  . Total abdominal hysterectomy w/ bilateral salpingoophorectomy      dysfunctional menses  . Bladder surgery      for incontinence  . G 1 p 1    . Colonscopy      tics 10-2002  . Interstitial cystitis      with clot in catheter  . Elevated lfts      due to Elmiron  . Colonoscopy w/ polypectomy      adenomatous poyp 04-2008  . Finger surgery Left 2004    2nd finger  . Cystoscopy with retrograde pyelogram, ureteroscopy and stent placement Left 09/01/2013    Procedure: CYSTOSCOPY WITH RETROGRADE PYELOGRAM, AND LEFT STENT PLACEMENT;  Surgeon:  Molli Hazard, MD;  Location: [facility];  Service: Urology;  Laterality: Left;  . Abdominal hysterectomy     History   Social History  . Marital Status: Married    Spouse Name: N/A    Number of Children: 1  . Years of Education: BS   Occupational History  . retired   . teacher     Kindergarten   Social History Main Topics  . Smoking status: Never Smoker   . Smokeless tobacco: Never Used  . Alcohol Use: No  . Drug Use: No  . Sexual Activity: Not on file   Other Topics Concern  . Not on file   Social History Narrative   Married         Family History  Problem Relation Age of Onset  . Coronary artery disease Mother   . Hypertension Mother   . Asthma Mother   . Osteoporosis Mother   . Dementia Mother   . Stroke Mother     mini cva  . Diabetes Father   . Cirrhosis Father     non alcoholic  . Cancer Sister     colon  . Colon cancer Sister 43  . Cancer Paternal Aunt     2 aunts had renal cancer  . Coronary artery disease  Maternal Grandfather   . Cancer Maternal Aunt     breast  . Diabetes Paternal Aunt   . Diabetes Paternal Grandmother    Patient Active Problem List   Diagnosis Date Noted  . Food allergy 03/06/2014  . Renal calculi 01/30/2014  . OSA (obstructive sleep apnea) 01/14/2014  . Subjective visual disturbance of both eyes 12/30/2013  . Visual changes 12/17/2013  . Elevated serum creatinine 12/16/2013  . Other abnormal glucose 12/16/2013  . Seasonal and perennial allergic rhinitis 12/06/2013  . Seasonal allergic conjunctivitis 12/06/2013  . Pneumonia, primary atypical 05/15/2013  . Chest pain 01/03/2013  . History of Bell's palsy 02/26/2012  . VITAMIN D DEFICIENCY 06/27/2010  . 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  . DRY MOUTH 06/27/2010  . SHOULDER PAIN, LEFT 06/27/2010  . LOW BACK PAIN, CHRONIC 08/16/2009  . ABDOMINAL PAIN, SUPRAPUBIC 05/25/2009  . GERD 04/30/2009  . DIVERTICULOSIS, COLON 04/30/2009  . OSTEOPENIA 04/30/2009  . COLONIC POLYPS, HX OF 04/30/2009  . COSTOCHONDRITIS 01/15/2008  . HYPERLIPIDEMIA 08/21/2007  . HYPERTENSION, ESSENTIAL NOS 08/21/2007  . ARTHRALGIA 08/21/2007  . BACK PAIN WITH RADICULOPATHY 01/25/2007   Current Outpatient Prescriptions on File Prior to Visit  Medication Sig Dispense Refill  . aspirin 81 MG tablet Take 81 mg by mouth daily.        Marland Kitchen BIOTIN PO Take 2,500 mg by mouth daily.      . Calcium Citrate (CITRACAL PO) Take by mouth. Citracal slow release 1200/D1000      . cetirizine (ZYRTEC) 10 MG tablet Take 10 mg by mouth daily.        . Cholecalciferol (VITAMIN D-3) 5000 UNITS TABS Take by mouth daily.      . cycloSPORINE (RESTASIS) 0.05 % ophthalmic emulsion Place 1 drop into both eyes 2 (two) times daily.       Marland Kitchen ibuprofen (ADVIL,MOTRIN) 800 MG tablet Take 800 mg by mouth 3 (three) times daily as needed.      . metoprolol succinate (TOPROL-XL) 50 MG 24 hr tablet TAKE ONE-HALF (1/2) TABLET DAILY  45 tablet  3  . traMADol (ULTRAM) 50 MG tablet Take 1  tablet (50 mg total) by mouth every 8 (eight) hours  as needed.  30 tablet  1  . vitamin E 400 UNIT capsule Take 400 Units by mouth daily.       No current facility-administered medications on file prior to visit.      Review of Systems    see HPI Objective:   Physical Exam Physical Exam  Nursing note and vitals reviewed.  Constitutional: She is oriented to person, place, and time. She appears well-developed and well-nourished.  HENT:  Head: Normocephalic and atraumatic.  Cardiovascular: Normal rate and regular rhythm. Exam reveals no gallop and no friction rub.  No murmur heard.  Pulmonary/Chest: Breath sounds normal. She has no wheezes. She has no rales.  Neurological: She is alert and oriented to person, place, and time.  Skin: Skin is warm and dry.  Psychiatric: She has a normal mood and affect. Her behavior is normal.              Assessment & Plan:  HTN:  Ok to stay off Lasix as it was used for edema only  Urinary incontinence/renal calculus  Followed by [facility]   Anxiety and sleep disturbance:  Her sleep/wake cycle is interrupted when her husband wakes up.   She really does not want RX meds.  Ok to try melatonin OTC  Can taper up to two 3 mg tablets.   TAke at dinner.   OSA/Allergic rhinitis  Followed by pulmonologist  Osteopenia    Elevated TG's   Will check at next lab draw  Carpal tunnel bilateral   GERD  On no meds now   Schedule CPE  Advised flu vaccine.  She declines today   See me prn

## 2014-04-12 ENCOUNTER — Encounter: Payer: Self-pay | Admitting: Internal Medicine

## 2014-04-12 DIAGNOSIS — N201 Calculus of ureter: Secondary | ICD-10-CM | POA: Insufficient documentation

## 2014-04-12 DIAGNOSIS — Z90710 Acquired absence of both cervix and uterus: Secondary | ICD-10-CM

## 2014-04-12 DIAGNOSIS — G5602 Carpal tunnel syndrome, left upper limb: Secondary | ICD-10-CM | POA: Insufficient documentation

## 2014-04-12 DIAGNOSIS — Z9079 Acquired absence of other genital organ(s): Secondary | ICD-10-CM | POA: Insufficient documentation

## 2014-04-12 DIAGNOSIS — Z90722 Acquired absence of ovaries, bilateral: Secondary | ICD-10-CM

## 2014-04-12 HISTORY — DX: Acquired absence of other genital organ(s): Z90.79

## 2014-04-12 HISTORY — DX: Acquired absence of both cervix and uterus: Z90.710

## 2014-04-20 ENCOUNTER — Other Ambulatory Visit: Payer: Self-pay

## 2014-04-20 DIAGNOSIS — Z1231 Encounter for screening mammogram for malignant neoplasm of breast: Secondary | ICD-10-CM

## 2014-04-28 ENCOUNTER — Ambulatory Visit
Admission: RE | Admit: 2014-04-28 | Discharge: 2014-04-28 | Disposition: A | Discharge status: Home / Self Care | Payer: Medicare Other | Source: Ambulatory Visit | Attending: Internal Medicine | Admitting: Internal Medicine

## 2014-04-28 DIAGNOSIS — Z780 Asymptomatic menopausal state: Secondary | ICD-10-CM | POA: Diagnosis not present

## 2014-04-28 DIAGNOSIS — M949 Disorder of cartilage, unspecified: Secondary | ICD-10-CM | POA: Diagnosis not present

## 2014-04-28 DIAGNOSIS — M899 Disorder of bone, unspecified: Secondary | ICD-10-CM

## 2014-04-30 ENCOUNTER — Encounter: Payer: Self-pay | Admitting: Gastroenterology

## 2014-05-07 ENCOUNTER — Ambulatory Visit: Payer: Medicare Other | Admitting: Internal Medicine

## 2014-05-13 ENCOUNTER — Encounter: Payer: Self-pay | Admitting: Internal Medicine

## 2014-05-20 ENCOUNTER — Ambulatory Visit
Admission: RE | Admit: 2014-05-20 | Discharge: 2014-05-20 | Disposition: A | Discharge status: Home / Self Care | Payer: Medicare Other | Source: Ambulatory Visit

## 2014-05-20 DIAGNOSIS — Z1231 Encounter for screening mammogram for malignant neoplasm of breast: Secondary | ICD-10-CM

## 2014-06-01 DIAGNOSIS — Z791 Long term (current) use of non-steroidal anti-inflammatories (NSAID): Secondary | ICD-10-CM | POA: Diagnosis not present

## 2014-06-01 DIAGNOSIS — M159 Polyosteoarthritis, unspecified: Secondary | ICD-10-CM | POA: Diagnosis not present

## 2014-06-01 DIAGNOSIS — M2550 Pain in unspecified joint: Secondary | ICD-10-CM | POA: Diagnosis not present

## 2014-06-01 DIAGNOSIS — R768 Other specified abnormal immunological findings in serum: Secondary | ICD-10-CM | POA: Diagnosis not present

## 2014-06-08 ENCOUNTER — Encounter: Payer: Self-pay | Admitting: *Deleted

## 2014-07-02 ENCOUNTER — Other Ambulatory Visit (INDEPENDENT_AMBULATORY_CARE_PROVIDER_SITE_OTHER): Payer: Medicare Other

## 2014-07-02 ENCOUNTER — Encounter: Payer: Self-pay | Admitting: Physician Assistant

## 2014-07-02 ENCOUNTER — Ambulatory Visit (INDEPENDENT_AMBULATORY_CARE_PROVIDER_SITE_OTHER): Payer: Medicare Other | Admitting: Physician Assistant

## 2014-07-02 ENCOUNTER — Telehealth: Payer: Self-pay | Admitting: Gastroenterology

## 2014-07-02 VITALS — BP 136/84 | HR 64 | Ht 62.0 in | Wt 170.5 lb

## 2014-07-02 DIAGNOSIS — G8929 Other chronic pain: Secondary | ICD-10-CM

## 2014-07-02 DIAGNOSIS — K5732 Diverticulitis of large intestine without perforation or abscess without bleeding: Secondary | ICD-10-CM

## 2014-07-02 DIAGNOSIS — R1031 Right lower quadrant pain: Secondary | ICD-10-CM

## 2014-07-02 DIAGNOSIS — K648 Other hemorrhoids: Secondary | ICD-10-CM

## 2014-07-02 LAB — URINALYSIS
Bilirubin Urine: NEGATIVE
Hgb urine dipstick: NEGATIVE
Ketones, ur: NEGATIVE
Leukocytes, UA: NEGATIVE
NITRITE: NEGATIVE
PH: 5.5 (ref 5.0–8.0)
Specific Gravity, Urine: <=1.005 — AB (ref 1.000–1.030)
TOTAL PROTEIN, URINE-UPE24: NEGATIVE
Urine Glucose: NEGATIVE
Urobilinogen, UA: 0.2 (ref 0.0–1.0)

## 2014-07-02 LAB — CBC WITH DIFFERENTIAL/PLATELET
BASOS PCT: 0.4 % (ref 0.0–3.0)
Basophils Absolute: 0 10*3/uL (ref 0.0–0.1)
EOS PCT: 2.6 % (ref 0.0–5.0)
Eosinophils Absolute: 0.2 10*3/uL (ref 0.0–0.7)
HCT: 38.7 % (ref 36.0–46.0)
Hemoglobin: 12.9 g/dL (ref 12.0–15.0)
Lymphocytes Relative: 31 % (ref 12.0–46.0)
Lymphs Abs: 2.3 10*3/uL (ref 0.7–4.0)
MCHC: 33.3 g/dL (ref 30.0–36.0)
MCV: 87.4 fl (ref 78.0–100.0)
Monocytes Absolute: 0.5 10*3/uL (ref 0.1–1.0)
Monocytes Relative: 7.3 % (ref 3.0–12.0)
NEUTROS PCT: 58.7 % (ref 43.0–77.0)
Neutro Abs: 4.4 10*3/uL (ref 1.4–7.7)
PLATELETS: 250 10*3/uL (ref 150.0–400.0)
RBC: 4.43 Mil/uL (ref 3.87–5.11)
RDW: 12.8 % (ref 11.5–15.5)
WBC: 7.5 10*3/uL (ref 4.0–10.5)

## 2014-07-02 MED ORDER — MENTHOL-ZINC OXIDE 0.44-20.6 % EX OINT: TOPICAL_OINTMENT | CUTANEOUS | Status: DC

## 2014-07-02 MED ORDER — METRONIDAZOLE 500 MG PO TABS: 500.0000 mg | ORAL_TABLET | Freq: Three times a day (TID) | ORAL | Status: DC

## 2014-07-02 MED ORDER — HYDROCORTISONE ACETATE 25 MG RE SUPP: RECTAL | Status: DC

## 2014-07-02 MED ORDER — CIPROFLOXACIN HCL 500 MG PO TABS: 500.0000 mg | ORAL_TABLET | Freq: Two times a day (BID) | ORAL | Status: DC

## 2014-07-02 NOTE — Progress Notes (Signed)
Patient ID: [name], female   DOB: May 10, 1946, 68 y.o.   MRN: [id number]     History of Present Illness: [name] is a 68 year old female with a history of hypertension, obstructive sleep apnea, seasonal and perennial allergic rhinitis, diverticulosis, GERD, carpal tunnel syndrome, lower back pain with radiculopathy, costochondritis, osteopenia, renal calculi, ankylosing spondylitis, who presents today with a 2 month history of vague lower abdominal pain that has become more pronounced over the past week and a half and is localized to the left lower quadrant and suprapubic area.  Initially her pain was throughout the lower abdomen. She has a history of interstitial cystitis and thought perhaps her pain was from that but she was not having a lot of urinary frequency or dysuria. Her pain was a constant ache that would occasionally cause her to [place] over and at night she would have to get in the fetal position to be comfortable. Her pain was sometimes exacerbated with ingestion of meals, and she has not noticed any alleviation of her pain with urination, defecation, or passage of gas. Since Thanksgiving her pain seems to be more in the suprapubic and left lower quadrant. Her stools have been loose and thin since Thanksgiving. She has had no bright red blood per rectum and no tarry stools but she does have some rectal discomfort. She often feels as if she has not completed a bowel movement and will have some anal seepage. Her appetite has been good. Her weight has been stable. She has no nausea or vomiting. She did feel as if she were warm for several nights last week but did not record a fever. Her pain does not radiate. She had a colonoscopy in December 2014 that revealed some left colon diverticuli.   Past Medical History  Diagnosis Date  . Hypertension   . Allergy     seasonal  . GERD (gastroesophageal reflux disease)   . Hyperlipidemia     NMR 2005  . Osteopenia     [facility]  . Hx of colonic polyps   . Diverticulosis of colon   . Interstitial cystitis   . Vitamin D deficiency   . Xerostomia     and Xeroophthalmia  . Arthritis   . Dry eye syndrome   . Osteoarthritis   . Cystitis   . Incontinence   . Ankylosing spondylitis   . HLA B27 (HLA B27 positive)   . Frozen shoulder   . Bell palsy   . Anxiety   . Osteopenia   . Subjective visual disturbance of both eyes 12/30/2013  . Menopause   . Sleep apnea   . CTS (carpal tunnel syndrome)     Past Surgical History  Procedure Laterality Date  . Total abdominal hysterectomy w/ bilateral salpingoophorectomy      dysfunctional menses  . Bladder surgery      for incontinence  . G 1 p 1    . Colonscopy      tics 10-2002  . Interstitial cystitis      with clot in catheter  . Elevated lfts      due to Elmiron  . Colonoscopy w/ polypectomy      adenomatous poyp 04-2008  . Finger surgery Left 2004    2nd finger  . Cystoscopy with retrograde pyelogram, ureteroscopy and stent placement Left 09/01/2013    Procedure: CYSTOSCOPY WITH RETROGRADE PYELOGRAM, AND LEFT STENT PLACEMENT;  Surgeon: Molli Hazard, MD;  Location: [facility];  Service: Urology;  Laterality: Left;  . Abdominal hysterectomy     Family History  Problem Relation Age of Onset  . Coronary artery disease Mother   . Hypertension Mother   . Asthma Mother   . Osteoporosis Mother   . Dementia Mother   . Stroke Mother     mini cva  . Diabetes Father   . Cirrhosis Father     non alcoholic  . Cancer Sister     colon  . Colon cancer Sister 55  . Cancer Paternal Aunt     2 aunts had renal cancer  . Coronary artery disease Maternal Grandfather   . Cancer Maternal Aunt     breast  . Diabetes Paternal Aunt   . Diabetes Paternal Grandmother    History  Substance Use Topics  . Smoking status: Never Smoker   . Smokeless tobacco: Never Used  . Alcohol Use: No   Current Outpatient Prescriptions  Medication Sig Dispense Refill    . aspirin 81 MG tablet Take 81 mg by mouth daily.      Marland Kitchen BIOTIN PO Take 2,500 mg by mouth daily.    . Calcium Citrate (CITRACAL PO) Take by mouth. Citracal slow release 1200/D1000    . cetirizine (ZYRTEC) 10 MG tablet Take 10 mg by mouth daily.      . Cholecalciferol (VITAMIN D-3) 5000 UNITS TABS Take by mouth daily.    . cycloSPORINE (RESTASIS) 0.05 % ophthalmic emulsion Place 1 drop into both eyes 2 (two) times daily.     . hyoscyamine (LEVSIN) 0.125 MG/5ML ELIX Take 0.125 mg by mouth. Pt taking generic form: Oscimin SL tabs 0.125 mg    . ibuprofen (ADVIL,MOTRIN) 800 MG tablet Take 800 mg by mouth 3 (three) times daily as needed.    . metoprolol succinate (TOPROL-XL) 50 MG 24 hr tablet TAKE ONE-HALF (1/2) TABLET DAILY 45 tablet 3  . Polyethyl Glycol-Propyl Glycol (SYSTANE OP) Apply 1 drop to eye as needed. Apply to both eyes as needed    . traMADol (ULTRAM) 50 MG tablet Take 1 tablet (50 mg total) by mouth every 8 (eight) hours as needed. 30 tablet 1  . vitamin E 400 UNIT capsule Take 400 Units by mouth daily.    . ciprofloxacin (CIPRO) 500 MG tablet Take 1 tablet (500 mg total) by mouth 2 (two) times daily. 14 tablet 0  . hydrocortisone (ANUSOL-HC) 25 MG suppository Use 1 suppository rectally twice a day for 10 days 24 suppository 0  . Menthol-Zinc Oxide (CALMOSEPTINE) 0.44-20.6 % OINT Apply 2-3 times daily to the perianal area for 2 weeks. 1 Tube 0  . metroNIDAZOLE (FLAGYL) 500 MG tablet Take 1 tablet (500 mg total) by mouth 3 (three) times daily. 21 tablet 0   No current facility-administered medications for this visit.   Allergies  Allergen Reactions  . Nitroglycerin Anaphylaxis    Hypotensive after NTG administration in ER during chest pain evaluation  . Pentosan Polysulfate Sodium     Elevated LFTs.......Marland Kitchen elmiron   . Bactrim [Sulfamethoxazole-Trimethoprim]   . Promethazine Hcl     "jittery on the inside"  . Lisinopril Cough  . Sulfamethoxazole-Trimethoprim Nausea Only       Review of Systems: Gen: Denies any fever, chills, sweats, anorexia, fatigue, weakness, malaise, weight loss, and sleep disorder CV: Denies chest pain, angina, palpitations, syncope, orthopnea, PND, peripheral edema, and claudication. Resp: Denies dyspnea at rest, dyspnea with exercise, cough, sputum, wheezing, coughing up blood, and pleurisy. GI: Denies vomiting blood,  jaundice, and fecal incontinence.   Denies dysphagia or odynophagia. GU : Denies urinary burning, blood in urine, urinary frequency, urinary hesitancy, nocturnal urination, and urinary incontinence. MS: Denies joint pain, limitation of movement, and swelling, stiffness, low back pain, extremity pain. Denies muscle weakness, cramps, atrophy.  Derm: Denies rash, itching, dry skin, hives, moles, warts, or unhealing ulcers.  Psych: Denies depression, anxiety, memory loss, suicidal ideation, hallucinations, paranoia, and confusion. Heme: Denies bruising, bleeding, and enlarged lymph nodes. Neuro:  Denies any headaches, dizziness, paresthesia Endo:  Denies any problems with DM, thyroid, adrenal   Studies:  Images     Show images for CT Abdomen Pelvis Wo Contrast     Study Result     CLINICAL DATA: Left flank pain.  EXAM: CT ABDOMEN AND PELVIS WITHOUT CONTRAST  TECHNIQUE: Multidetector CT imaging of the abdomen and pelvis was performed following the standard protocol without intravenous contrast.  COMPARISON: Pelvic MRI and 04/05/2007  FINDINGS: The lung bases are clear without focal nodule, mass, or airspace disease. The heart size is normal. No significant pleural or pericardial effusion is present.  The liver and spleen are within normal limits. The stomach, duodenum, and pancreas are within normal limits. Come bowel duct and gallbladder are unremarkable. The adrenal glands are normal bilaterally. The right kidney and ureter are normal. A 2 mm obstructing stone is evident near the left UVJ. The  ureter is slightly dilated above this level. The more distal ureter is difficult to discretely tract.  The urinary bladder is otherwise within normal limits. Focal densities below the urinary bladder are likely related to Bartholin's gland cysts. The rectosigmoid colon demonstrates mild diverticular change without evidence for inflammation to suggest diverticulitis. The more proximal colon is within normal limits. The appendix is visualized and normal. Small bowel is unremarkable. No significant adenopathy is present.  The bone windows are unremarkable.  IMPRESSION: 1. The least 1 obstructing stone in the left ureter is evident at the level of the pelvic inlet with mild dilation of more proximally ureter. 2. A 2 mm stone is present within the urinary bladder near the UVJ. This could represent a recently passed stone or persistent obstruction of the most distal ureter. 3. No other significant nephrolithiasis is evident. 4. Status post hysterectomy and probable bilateral oophorectomy.   Electronically Signed  By: Lawrence Santiago M.D.  On: 08/14/2013 08:15  PROCEDURES: Colonoscopy 07/04/2013 endoscopic impression: 9 polyps were found, removed and sent to pathology. There were a few diverticulum in the left colon.  Physical Exam: General: Pleasant, well developed , pleasant female in no acute distress Head: Normocephalic and atraumatic Eyes:  sclerae anicteric, conjunctiva pink  Ears: Normal auditory acuity Lungs: Clear throughout to auscultation Heart: Regular rate and rhythm Abdomen: Soft, non distended, tender LLQ and suprapubic area, no rebound or guarding No masses, no hepatomegaly. Normal bowel sounds Rectal:brown stool, hem neg. Internal hemorrhoids. Small crack in sckin/excoriated area in intergl;uteal fold with no s/s infection Musculoskeletal: Symmetrical with no gross deformities  Extremities: No edema  Neurological: Alert oriented x 4, grossly  nonfocal Psychological:  Alert and cooperative. Normal mood and affect  Assessment and Recommendations: #1 left lower quadrant abdominal pain. She has some mild left-sided diverticular disease noted on colonoscopy. She may have had an area of colon spasm started bothering her a couple months ago but since Thanksgiving her pain has intensified. We will empirically treat her for diverticulitis with Cipro 500 mg twice a day for 7 days along with Flagyl 500 mg  3 times a day for 7 days she will adhere to a low residue diet. A UA will be obtained today prior to her starting her antibiotics to be sure she does not have an underlying UTI. She has hyoscyamine at home that she was advised she can use on an as-needed basis for cramping.  #2. Hemorrhoids. She will be given a trial of Anusol HC suppositories 1 per rectum twice a day for 10 days. She has been advised to use Tucks wipes after bowel movements.  She will follow up in 5-6 weeks, sooner as needed. She's been instructed to call us if she fails to note any improvement or if she has any additional concerns.        Kaysha Parsell, Deloris Ping 07/02/2014, Pager [phone]

## 2014-07-02 NOTE — Telephone Encounter (Signed)
Pt states she has been having abdominal cramping below her belly button and that it is difficult to go to sleep sometimes. States that some nights it wakes her up. States it has been happening for about 2 months. Pt would like to be seen. Pt scheduled to see Lori Hvozdovic, PA-C 07/02/14@2 :15pm. Pt aware of appt.

## 2014-07-02 NOTE — Patient Instructions (Signed)
Your physician has requested that you go to the basement for the following lab work before leaving today:  CBC, UA  We have sent the following medications to your pharmacy for you to pick up at your convenience:  Anusol HC suppositories, Cipro, Flagyl, Calmoseptine ointment  You may use Tucks wipes.  As discussed with Cecille Rubin, try a calcium supplement with magnesium.  You have been given information on a low residue diet.  Please follow up with Cecille Rubin in 5-6 weeks.

## 2014-07-03 NOTE — Progress Notes (Signed)
I agree with the above note, plan 

## 2014-07-08 ENCOUNTER — Ambulatory Visit (INDEPENDENT_AMBULATORY_CARE_PROVIDER_SITE_OTHER): Payer: Medicare Other | Admitting: Internal Medicine

## 2014-07-08 ENCOUNTER — Other Ambulatory Visit (INDEPENDENT_AMBULATORY_CARE_PROVIDER_SITE_OTHER): Payer: Medicare Other

## 2014-07-08 ENCOUNTER — Encounter: Payer: Self-pay | Admitting: Internal Medicine

## 2014-07-08 VITALS — BP 110/84 | HR 70 | Temp 98.3°F | Wt 168.5 lb

## 2014-07-08 DIAGNOSIS — Z86010 Personal history of colonic polyps: Secondary | ICD-10-CM

## 2014-07-08 DIAGNOSIS — K5730 Diverticulosis of large intestine without perforation or abscess without bleeding: Secondary | ICD-10-CM | POA: Diagnosis not present

## 2014-07-08 DIAGNOSIS — N3010 Interstitial cystitis (chronic) without hematuria: Secondary | ICD-10-CM

## 2014-07-08 DIAGNOSIS — R1030 Lower abdominal pain, unspecified: Secondary | ICD-10-CM | POA: Diagnosis not present

## 2014-07-08 DIAGNOSIS — R5383 Other fatigue: Secondary | ICD-10-CM | POA: Diagnosis not present

## 2014-07-08 LAB — HEPATIC FUNCTION PANEL
ALBUMIN: 4.5 g/dL (ref 3.5–5.2)
ALT: 40 U/L — AB (ref 0–35)
AST: 38 U/L — ABNORMAL HIGH (ref 0–37)
Alkaline Phosphatase: 74 U/L (ref 39–117)
Bilirubin, Direct: 0.1 mg/dL (ref 0.0–0.3)
Total Bilirubin: 0.6 mg/dL (ref 0.2–1.2)
Total Protein: 7.9 g/dL (ref 6.0–8.3)

## 2014-07-08 LAB — TSH: TSH: 5.86 u[IU]/mL — ABNORMAL HIGH (ref 0.35–4.50)

## 2014-07-08 LAB — T4, FREE: Free T4: 0.77 ng/dL (ref 0.60–1.60)

## 2014-07-08 LAB — SEDIMENTATION RATE: SED RATE: 15 mm/h (ref 0–22)

## 2014-07-08 NOTE — Patient Instructions (Signed)
Your next office appointment will be determined based upon review of your pending labs . Those instructions will be transmitted to you through My Chart .  Followup as needed for your acute issue. Please report any significant change in your symptoms. 

## 2014-07-08 NOTE — Progress Notes (Signed)
Pre visit review using our clinic review tool, if applicable. No additional management support is needed unless otherwise documented below in the visit note. 

## 2014-07-08 NOTE — Progress Notes (Signed)
   Subjective:    Patient ID: CHIRSTY [name], female    DOB: Dec 13, 1945, 68 y.o.   MRN: [id number]  HPI She is here with the chronic abdominal pain which is described as cramping and present 24/7 since 2005. It suprapubic and nonradiating  She's had an extensive evaluation; her Gynecologist performed an ultrasound which was negative.  She saw gastroenterology 12/3. Cipro and metronidazole were recommended. She did not take these for fear of possible adverse effects, mainly seizures  Her colonoscopy in 2014 did reveal 9 polyps; she is to have follow-up in 2017.CBC & dif WNL this month.  She has a diagnosis of interstitial cystitis and has been followed by Dr. Amalia Hailey in the past. She subsequently saw his partners [facility]. She was placed on one agent which caused elevated liver function test. She was found to have renal calculi. Cystoscopy was apparently negative.  These symptoms are in the context of stress over the last 28 months related to her husband,s severe multi systems health problems.  She describes fatigue and sleepiness after meals.     Review of Systems    She questions having rosacea manifested as erythema of eyelids. She's questioning the need for doxycycline..  She does have some dyspepsia without dysphagia.  She has no anorexia or hematemesis.  Weight is stable.  She's had chills and sweats without fever. She does not have dysuria or hematuria. She also denies pyuria.       Objective:   Physical Exam General appearance is one of good health and nourishment w/o distress.  Eyes: No conjunctival inflammation or scleral icterus is present.  Oral exam: Dental hygiene is good; lips and gums are healthy appearing.There is no oropharyngeal erythema or exudate noted.   Heart:  Normal rate and regular rhythm. S1 and S2 normal without gallop, murmur, click, rub or other extra sounds     Lungs:Chest clear to auscultation; no wheezes, rhonchi,rales ,or rubs  present.No increased work of breathing.   Abdomen: bowel sounds normal, soft and non-tender without masses, organomegaly or hernias noted.  No guarding or rebound . No tenderness over the flanks to percussion  Musculoskeletal: Able to lie flat and sit up without help. Negative straight leg raising bilaterally. Minor crepitus L knee.Gait normal  Skin:Warm & dry.  Intact without suspicious lesions or rashes ; no jaundice or tenting  Lymphatic: No lymphadenopathy is noted about the head, neck, axilla                Assessment & Plan:  #1 chronic abdominal pain #2 IC #3 stress #4 fatigue See  Orders & AVS If labs WNL ;I recommend F/U with Dr Amalia Hailey and Psychology referral Marya Amsler)

## 2014-07-09 ENCOUNTER — Other Ambulatory Visit: Payer: Self-pay | Admitting: Internal Medicine

## 2014-07-09 DIAGNOSIS — R7989 Other specified abnormal findings of blood chemistry: Secondary | ICD-10-CM

## 2014-07-09 DIAGNOSIS — R945 Abnormal results of liver function studies: Principal | ICD-10-CM

## 2014-07-09 DIAGNOSIS — N3010 Interstitial cystitis (chronic) without hematuria: Secondary | ICD-10-CM | POA: Insufficient documentation

## 2014-07-09 MED ORDER — LEVOTHYROXINE SODIUM 25 MCG PO TABS: 25.0000 ug | ORAL_TABLET | Freq: Every day | ORAL | Status: DC

## 2014-07-10 ENCOUNTER — Encounter: Payer: Self-pay | Admitting: Internal Medicine

## 2014-07-10 ENCOUNTER — Other Ambulatory Visit: Payer: Self-pay | Admitting: Internal Medicine

## 2014-07-10 DIAGNOSIS — F4323 Adjustment disorder with mixed anxiety and depressed mood: Secondary | ICD-10-CM

## 2014-07-22 ENCOUNTER — Ambulatory Visit (INDEPENDENT_AMBULATORY_CARE_PROVIDER_SITE_OTHER): Payer: Medicare Other | Admitting: Licensed Clinical Social Worker

## 2014-07-22 DIAGNOSIS — F330 Major depressive disorder, recurrent, mild: Secondary | ICD-10-CM

## 2014-08-12 ENCOUNTER — Encounter: Payer: Self-pay | Admitting: Internal Medicine

## 2014-08-12 ENCOUNTER — Ambulatory Visit (INDEPENDENT_AMBULATORY_CARE_PROVIDER_SITE_OTHER): Payer: Medicare Other | Admitting: Licensed Clinical Social Worker

## 2014-08-12 DIAGNOSIS — F330 Major depressive disorder, recurrent, mild: Secondary | ICD-10-CM | POA: Diagnosis not present

## 2014-08-21 ENCOUNTER — Ambulatory Visit: Payer: Medicare Other | Admitting: Licensed Clinical Social Worker

## 2014-08-26 DIAGNOSIS — L308 Other specified dermatitis: Secondary | ICD-10-CM | POA: Diagnosis not present

## 2014-08-26 DIAGNOSIS — M545 Low back pain: Secondary | ICD-10-CM | POA: Diagnosis not present

## 2014-08-28 ENCOUNTER — Ambulatory Visit (INDEPENDENT_AMBULATORY_CARE_PROVIDER_SITE_OTHER): Payer: Medicare Other | Admitting: Family

## 2014-08-28 ENCOUNTER — Encounter: Payer: Self-pay | Admitting: Family

## 2014-08-28 VITALS — BP 150/92 | HR 54 | Temp 97.9°F | Resp 18 | Ht 62.5 in | Wt 171.0 lb

## 2014-08-28 DIAGNOSIS — S39012A Strain of muscle, fascia and tendon of lower back, initial encounter: Secondary | ICD-10-CM | POA: Diagnosis not present

## 2014-08-28 NOTE — Patient Instructions (Signed)
Thank you for choosing [facility].  Summary/Instructions:  If your symptoms worsen or fail to improve, please contact our office for further instruction, or in case of emergency go directly to the emergency room at the closest medical facility.   Low Back Strain with Rehab A strain is an injury in which a tendon or muscle is torn. The muscles and tendons of the lower back are vulnerable to strains. However, these muscles and tendons are very strong and require a great force to be injured. Strains are classified into three categories. Grade 1 strains cause pain, but the tendon is not lengthened. Grade 2 strains include a lengthened ligament, due to the ligament being stretched or partially ruptured. With grade 2 strains there is still function, although the function may be decreased. Grade 3 strains involve a complete tear of the tendon or muscle, and function is usually impaired. SYMPTOMS   Pain in the lower back.  Pain that affects one side more than the other.  Pain that gets worse with movement and may be felt in the hip, buttocks, or back of the thigh.  Muscle spasms of the muscles in the back.  Swelling along the muscles of the back.  Loss of strength of the back muscles.  Crackling sound (crepitation) when the muscles are touched. CAUSES  Lower back strains occur when a force is placed on the muscles or tendons that is greater than they can handle. Common causes of injury include:  Prolonged overuse of the muscle-tendon units in the lower back, usually from incorrect posture.  A single violent injury or force applied to the back. RISK INCREASES WITH:  Sports that involve twisting forces on the spine or a lot of bending at the waist (football, rugby, weightlifting, bowling, golf, tennis, speed skating, racquetball, swimming, running, gymnastics, diving).  Poor strength and flexibility.  Failure to warm up properly before activity.  Family history of lower back pain  or disk disorders.  Previous back injury or surgery (especially fusion).  Poor posture with lifting, especially heavy objects.  Prolonged sitting, especially with poor posture. PREVENTION   Learn and use proper posture when sitting or lifting (maintain proper posture when sitting, lift using the knees and legs, not at the waist).  Warm up and stretch properly before activity.  Allow for adequate recovery between workouts.  Maintain physical fitness:  Strength, flexibility, and endurance.  Cardiovascular fitness. PROGNOSIS  If treated properly, lower back strains usually heal within 6 weeks. RELATED COMPLICATIONS   Recurring symptoms, resulting in a chronic problem.  Chronic inflammation, scarring, and partial muscle-tendon tear.  Delayed healing or resolution of symptoms.  Prolonged disability. TREATMENT  Treatment first involves the use of ice and medicine, to reduce pain and inflammation. The use of strengthening and stretching exercises may help reduce pain with activity. These exercises may be performed at home or with a therapist. Severe injuries may require referral to a therapist for further evaluation and treatment, such as ultrasound. Your caregiver may advise that you wear a back brace or corset, to help reduce pain and discomfort. Often, prolonged bed rest results in greater harm then benefit. Corticosteroid injections may be recommended. However, these should be reserved for the most serious cases. It is important to avoid using your back when lifting objects. At night, sleep on your back on a firm mattress with a pillow placed under your knees. If non-surgical treatment is unsuccessful, surgery may be needed.  MEDICATION   If pain medicine is needed, nonsteroidal  anti-inflammatory medicines (aspirin and ibuprofen), or other minor pain relievers (acetaminophen), are often advised.  Do not take pain medicine for 7 days before surgery.  Prescription pain relievers may  be given, if your caregiver thinks they are needed. Use only as directed and only as much as you need.  Ointments applied to the skin may be helpful.  Corticosteroid injections may be given by your caregiver. These injections should be reserved for the most serious cases, because they may only be given a certain number of times. HEAT AND COLD  Cold treatment (icing) should be applied for 10 to 15 minutes every 2 to 3 hours for inflammation and pain, and immediately after activity that aggravates your symptoms. Use ice packs or an ice massage.  Heat treatment may be used before performing stretching and strengthening activities prescribed by your caregiver, physical therapist, or athletic trainer. Use a heat pack or a warm water soak. SEEK MEDICAL CARE IF:   Symptoms get worse or do not improve in 2 to 4 weeks, despite treatment.  You develop numbness, weakness, or loss of bowel or bladder function.  New, unexplained symptoms develop. (Drugs used in treatment may produce side effects.) EXERCISES  RANGE OF MOTION (ROM) AND STRETCHING EXERCISES - Low Back Strain Most people with lower back pain will find that their symptoms get worse with excessive bending forward (flexion) or arching at the lower back (extension). The exercises which will help resolve your symptoms will focus on the opposite motion.  Your physician, physical therapist or athletic trainer will help you determine which exercises will be most helpful to resolve your lower back pain. Do not complete any exercises without first consulting with your caregiver. Discontinue any exercises which make your symptoms worse until you speak to your caregiver.  If you have pain, numbness or tingling which travels down into your buttocks, leg or foot, the goal of the therapy is for these symptoms to move closer to your back and eventually resolve. Sometimes, these leg symptoms will get better, but your lower back pain may worsen. This is typically  an indication of progress in your rehabilitation. Be very alert to any changes in your symptoms and the activities in which you participated in the 24 hours prior to the change. Sharing this information with your caregiver will allow him/her to most efficiently treat your condition.  These exercises may help you when beginning to rehabilitate your injury. Your symptoms may resolve with or without further involvement from your physician, physical therapist or athletic trainer. While completing these exercises, remember:  Restoring tissue flexibility helps normal motion to return to the joints. This allows healthier, less painful movement and activity.  An effective stretch should be held for at least 30 seconds.  A stretch should never be painful. You should only feel a gentle lengthening or release in the stretched tissue. FLEXION RANGE OF MOTION AND STRETCHING EXERCISES: STRETCH - Flexion, Single Knee to Chest   Lie on a firm bed or floor with both legs extended in front of you.  Keeping one leg in contact with the floor, bring your opposite knee to your chest. Hold your leg in place by either grabbing behind your thigh or at your knee.  Pull until you feel a gentle stretch in your lower back. Hold __________ seconds.  Slowly release your grasp and repeat the exercise with the opposite side. Repeat __________ times. Complete this exercise __________ times per day.  STRETCH - Flexion, Double Knee to Chest  Lie on a firm bed or floor with both legs extended in front of you.  Keeping one leg in contact with the floor, bring your opposite knee to your chest.  Tense your stomach muscles to support your back and then lift your other knee to your chest. Hold your legs in place by either grabbing behind your thighs or at your knees.  Pull both knees toward your chest until you feel a gentle stretch in your lower back. Hold __________ seconds.  Tense your stomach muscles and slowly return one  leg at a time to the floor. Repeat __________ times. Complete this exercise __________ times per day.  STRETCH - Low Trunk Rotation  Lie on a firm bed or floor. Keeping your legs in front of you, bend your knees so they are both pointed toward the ceiling and your feet are flat on the floor.  Extend your arms out to the side. This will stabilize your upper body by keeping your shoulders in contact with the floor.  Gently and slowly drop both knees together to one side until you feel a gentle stretch in your lower back. Hold for __________ seconds.  Tense your stomach muscles to support your lower back as you bring your knees back to the starting position. Repeat the exercise to the other side. Repeat __________ times. Complete this exercise __________ times per day  EXTENSION RANGE OF MOTION AND FLEXIBILITY EXERCISES: STRETCH - Extension, Prone on Elbows   Lie on your stomach on the floor, a bed will be too soft. Place your palms about shoulder width apart and at the height of your head.  Place your elbows under your shoulders. If this is too painful, stack pillows under your chest.  Allow your body to relax so that your hips drop lower and make contact more completely with the floor.  Hold this position for __________ seconds.  Slowly return to lying flat on the floor. Repeat __________ times. Complete this exercise __________ times per day.  RANGE OF MOTION - Extension, Prone Press Ups  Lie on your stomach on the floor, a bed will be too soft. Place your palms about shoulder width apart and at the height of your head.  Keeping your back as relaxed as possible, slowly straighten your elbows while keeping your hips on the floor. You may adjust the placement of your hands to maximize your comfort. As you gain motion, your hands will come more underneath your shoulders.  Hold this position __________ seconds.  Slowly return to lying flat on the floor. Repeat __________ times. Complete  this exercise __________ times per day.  RANGE OF MOTION- Quadruped, Neutral Spine   Assume a hands and knees position on a firm surface. Keep your hands under your shoulders and your knees under your hips. You may place padding under your knees for comfort.  Drop your head and point your tail bone toward the ground below you. This will round out your lower back like an angry cat. Hold this position for __________ seconds.  Slowly lift your head and release your tail bone so that your back sags into a large arch, like an old horse.  Hold this position for __________ seconds.  Repeat this until you feel limber in your lower back.  Now, find your "sweet spot." This will be the most comfortable position somewhere between the two previous positions. This is your neutral spine. Once you have found this position, tense your stomach muscles to support your lower back.  Hold this position for __________ seconds. Repeat __________ times. Complete this exercise __________ times per day.  STRENGTHENING EXERCISES - Low Back Strain These exercises may help you when beginning to rehabilitate your injury. These exercises should be done near your "sweet spot." This is the neutral, low-back arch, somewhere between fully rounded and fully arched, that is your least painful position. When performed in this safe range of motion, these exercises can be used for people who have either a flexion or extension based injury. These exercises may resolve your symptoms with or without further involvement from your physician, physical therapist or athletic trainer. While completing these exercises, remember:   Muscles can gain both the endurance and the strength needed for everyday activities through controlled exercises.  Complete these exercises as instructed by your physician, physical therapist or athletic trainer. Increase the resistance and repetitions only as guided.  You may experience muscle soreness or fatigue,  but the pain or discomfort you are trying to eliminate should never worsen during these exercises. If this pain does worsen, stop and make certain you are following the directions exactly. If the pain is still present after adjustments, discontinue the exercise until you can discuss the trouble with your caregiver. STRENGTHENING - Deep Abdominals, Pelvic Tilt  Lie on a firm bed or floor. Keeping your legs in front of you, bend your knees so they are both pointed toward the ceiling and your feet are flat on the floor.  Tense your lower abdominal muscles to press your lower back into the floor. This motion will rotate your pelvis so that your tail bone is scooping upwards rather than pointing at your feet or into the floor.  With a gentle tension and even breathing, hold this position for __________ seconds. Repeat __________ times. Complete this exercise __________ times per day.  STRENGTHENING - Abdominals, Crunches   Lie on a firm bed or floor. Keeping your legs in front of you, bend your knees so they are both pointed toward the ceiling and your feet are flat on the floor. Cross your arms over your chest.  Slightly tip your chin down without bending your neck.  Tense your abdominals and slowly lift your trunk high enough to just clear your shoulder blades. Lifting higher can put excessive stress on the lower back and does not further strengthen your abdominal muscles.  Control your return to the starting position. Repeat __________ times. Complete this exercise __________ times per day.  STRENGTHENING - Quadruped, Opposite UE/LE Lift   Assume a hands and knees position on a firm surface. Keep your hands under your shoulders and your knees under your hips. You may place padding under your knees for comfort.  Find your neutral spine and gently tense your abdominal muscles so that you can maintain this position. Your shoulders and hips should form a rectangle that is parallel with the floor and  is not twisted.  Keeping your trunk steady, lift your right hand no higher than your shoulder and then your left leg no higher than your hip. Make sure you are not holding your breath. Hold this position __________ seconds.  Continuing to keep your abdominal muscles tense and your back steady, slowly return to your starting position. Repeat with the opposite arm and leg. Repeat __________ times. Complete this exercise __________ times per day.  STRENGTHENING - Lower Abdominals, Double Knee Lift  Lie on a firm bed or floor. Keeping your legs in front of you, bend your knees so they are both  pointed toward the ceiling and your feet are flat on the floor.  Tense your abdominal muscles to brace your lower back and slowly lift both of your knees until they come over your hips. Be certain not to hold your breath.  Hold __________ seconds. Using your abdominal muscles, return to the starting position in a slow and controlled manner. Repeat __________ times. Complete this exercise __________ times per day.  POSTURE AND BODY MECHANICS CONSIDERATIONS - Low Back Strain Keeping correct posture when sitting, standing or completing your activities will reduce the stress put on different body tissues, allowing injured tissues a chance to heal and limiting painful experiences. The following are general guidelines for improved posture. Your physician or physical therapist will provide you with any instructions specific to your needs. While reading these guidelines, remember:  The exercises prescribed by your provider will help you have the flexibility and strength to maintain correct postures.  The correct posture provides the best environment for your joints to work. All of your joints have less wear and tear when properly supported by a spine with good posture. This means you will experience a healthier, less painful body.  Correct posture must be practiced with all of your activities, especially prolonged  sitting and standing. Correct posture is as important when doing repetitive low-stress activities (typing) as it is when doing a single heavy-load activity (lifting). RESTING POSITIONS Consider which positions are most painful for you when choosing a resting position. If you have pain with flexion-based activities (sitting, bending, stooping, squatting), choose a position that allows you to rest in a less flexed posture. You would want to avoid curling into a fetal position on your side. If your pain worsens with extension-based activities (prolonged standing, working overhead), avoid resting in an extended position such as sleeping on your stomach. Most people will find more comfort when they rest with their spine in a more neutral position, neither too rounded nor too arched. Lying on a non-sagging bed on your side with a pillow between your knees, or on your back with a pillow under your knees will often provide some relief. Keep in mind, being in any one position for a prolonged period of time, no matter how correct your posture, can still lead to stiffness. PROPER SITTING POSTURE In order to minimize stress and discomfort on your spine, you must sit with correct posture. Sitting with good posture should be effortless for a healthy body. Returning to good posture is a gradual process. Many people can work toward this most comfortably by using various supports until they have the flexibility and strength to maintain this posture on their own. When sitting with proper posture, your ears will fall over your shoulders and your shoulders will fall over your hips. You should use the back of the chair to support your upper back. Your lower back will be in a neutral position, just slightly arched. You may place a small pillow or folded towel at the base of your lower back for support.  When working at a desk, create an environment that supports good, upright posture. Without extra support, muscles tire, which  leads to excessive strain on joints and other tissues. Keep these recommendations in mind: CHAIR:  A chair should be able to slide under your desk when your back makes contact with the back of the chair. This allows you to work closely.  The chair's height should allow your eyes to be level with the upper part of your monitor and your  hands to be slightly lower than your elbows. BODY POSITION  Your feet should make contact with the floor. If this is not possible, use a foot rest.  Keep your ears over your shoulders. This will reduce stress on your neck and lower back. INCORRECT SITTING POSTURES  If you are feeling tired and unable to assume a healthy sitting posture, do not slouch or slump. This puts excessive strain on your back tissues, causing more damage and pain. Healthier options include:  Using more support, like a lumbar pillow.  Switching tasks to something that requires you to be upright or walking.  Talking a brief walk.  Lying down to rest in a neutral-spine position. PROLONGED STANDING WHILE SLIGHTLY LEANING FORWARD  When completing a task that requires you to lean forward while standing in one place for a long time, place either foot up on a stationary 2-4 inch high object to help maintain the best posture. When both feet are on the ground, the lower back tends to lose its slight inward curve. If this curve flattens (or becomes too large), then the back and your other joints will experience too much stress, tire more quickly, and can cause pain. CORRECT STANDING POSTURES Proper standing posture should be assumed with all daily activities, even if they only take a few moments, like when brushing your teeth. As in sitting, your ears should fall over your shoulders and your shoulders should fall over your hips. You should keep a slight tension in your abdominal muscles to brace your spine. Your tailbone should point down to the ground, not behind your body, resulting in an  over-extended swayback posture.  INCORRECT STANDING POSTURES  Common incorrect standing postures include a forward head, locked knees and/or an excessive swayback. WALKING Walk with an upright posture. Your ears, shoulders and hips should all line-up. PROLONGED ACTIVITY IN A FLEXED POSITION When completing a task that requires you to bend forward at your waist or lean over a low surface, try to find a way to stabilize 3 out of 4 of your limbs. You can place a hand or elbow on your thigh or rest a knee on the surface you are reaching across. This will provide you more stability so that your muscles do not fatigue as quickly. By keeping your knees relaxed, or slightly bent, you will also reduce stress across your lower back. CORRECT LIFTING TECHNIQUES DO :   Assume a wide stance. This will provide you more stability and the opportunity to get as close as possible to the object which you are lifting.  Tense your abdominals to brace your spine. Bend at the knees and hips. Keeping your back locked in a neutral-spine position, lift using your leg muscles. Lift with your legs, keeping your back straight.  Test the weight of unknown objects before attempting to lift them.  Try to keep your elbows locked down at your sides in order get the best strength from your shoulders when carrying an object.  Always ask for help when lifting heavy or awkward objects. INCORRECT LIFTING TECHNIQUES DO NOT:   Lock your knees when lifting, even if it is a small object.  Bend and twist. Pivot at your feet or move your feet when needing to change directions.  Assume that you can safely pick up even a paper clip without proper posture. Document Released: 07/17/2005 Document Revised: 10/09/2011 Document Reviewed: 10/29/2008 [facility] Patient Information 2015 [place], [place]. This information is not intended to replace advice given to you by  your health care provider. Make sure you discuss any questions you have with  your health care provider.

## 2014-08-28 NOTE — Progress Notes (Signed)
Subjective:    Patient ID: [name], female    DOB: [birth year], 69 y.o.   MRN: [id number]  Chief Complaint  Patient presents with  . Back Pain    left lower back pain x1 week, yesterday and the day before it was worse it hurt when breathing and she has been checked for kidney stones and none were found    HPI:   [name] is a 69 y.o. female who presents today for an office visit.   Associated symptoms of left lower back pain have been going on for a week. Was seen yesterday in urology and x-ray results showed no kidney stone. She was given an pain injection as well. Pain at worst is rated 9/10 but is currently 4/10. Movement exacerbates the pain and resting improves it.  Has taken ibuprofen 800 mg last evening with unknown effect because she fell asleep. Denies any changes in bowel or bladder habits.    Allergies  Allergen Reactions  . Nitroglycerin Anaphylaxis    Hypotensive after NTG administration in ER during chest pain evaluation  . Pentosan Polysulfate Sodium     Elevated LFTs.......Marland Kitchen elmiron   . Bactrim [Sulfamethoxazole-Trimethoprim]   . Promethazine Hcl     "jittery on the inside"  . Lisinopril Cough  . Sulfamethoxazole-Trimethoprim Nausea Only    Current Outpatient Prescriptions on File Prior to Visit  Medication Sig Dispense Refill  . aspirin 81 MG tablet Take 81 mg by mouth daily.      Marland Kitchen BIOTIN PO Take 2,500 mg by mouth daily.    . Calcium Citrate (CITRACAL PO) Take by mouth. Citracal slow release 1200/D1000    . cetirizine (ZYRTEC) 10 MG tablet Take 10 mg by mouth daily.      . Cholecalciferol (VITAMIN D-3) 5000 UNITS TABS Take by mouth daily.    . cycloSPORINE (RESTASIS) 0.05 % ophthalmic emulsion Place 1 drop into both eyes 2 (two) times daily.     . hyoscyamine (LEVSIN) 0.125 MG/5ML ELIX Take 0.125 mg by mouth. Pt taking generic form: Oscimin SL tabs 0.125 mg    . ibuprofen (ADVIL,MOTRIN) 800 MG tablet Take 800 mg by mouth 3 (three) times daily as  needed.    Marland Kitchen levothyroxine (LEVOTHROID) 25 MCG tablet Take 1 tablet (25 mcg total) by mouth daily before breakfast. 90 tablet 0  . Menthol-Zinc Oxide (CALMOSEPTINE) 0.44-20.6 % OINT Apply 2-3 times daily to the perianal area for 2 weeks. 1 Tube 0  . metoprolol succinate (TOPROL-XL) 50 MG 24 hr tablet TAKE ONE-HALF (1/2) TABLET DAILY 45 tablet 3  . Polyethyl Glycol-Propyl Glycol (SYSTANE OP) Apply 1 drop to eye as needed. Apply to both eyes as needed    . traMADol (ULTRAM) 50 MG tablet Take 1 tablet (50 mg total) by mouth every 8 (eight) hours as needed. 30 tablet 1  . vitamin E 400 UNIT capsule Take 400 Units by mouth daily.     No current facility-administered medications on file prior to visit.    Review of Systems  Constitutional: Negative for fever and chills.  Genitourinary: Negative for dysuria, urgency, frequency and hematuria.  Musculoskeletal: Positive for back pain.  Neurological: Negative for numbness.      Objective:    BP 150/92 mmHg  Pulse 54  Temp(Src) 97.9 F (36.6 C) (Oral)  Resp 18  Ht 5' 2.5" (1.588 m)  Wt 171 lb (77.565 kg)  BMI 30.76 kg/m2  SpO2 96% Nursing note and vital signs reviewed.  Physical  Exam  Constitutional: She is oriented to person, place, and time. She appears well-developed and well-nourished. No distress.  Cardiovascular: Normal rate, regular rhythm, normal heart sounds and intact distal pulses.   Pulmonary/Chest: Effort normal and breath sounds normal.  Musculoskeletal:  No obvious deformity, discoloration, or edema noted of lower back. No palpable tenderness elicited. Patient indicates pain with forward flexion in the left paraspinal musculature. Range of motion is full and appropriate. Distal pulses and sensation are intact and appropriate. Negative straight leg raise. Reflexes are intact and appropriate.  Neurological: She is alert and oriented to person, place, and time.  Skin: Skin is warm and dry.  Psychiatric: She has a normal mood  and affect. Her behavior is normal. Judgment and thought content normal.       Assessment & Plan:

## 2014-08-28 NOTE — Assessment & Plan Note (Addendum)
Symptoms and exam consistent with low back strain. Continue previously prescribed 800 mg ibuprofen for pain relief. Recommend heating 2-3 times per day for 20 minutes as needed. May also use OTC products like thermacare and icy/hot for symptom relief. Stretch in all directions multiple times daily as discussed. Follow-up if symptoms fail to improve.

## 2014-08-28 NOTE — Progress Notes (Signed)
Pre visit review using our clinic review tool, if applicable. No additional management support is needed unless otherwise documented below in the visit note. 

## 2014-09-01 ENCOUNTER — Ambulatory Visit (INDEPENDENT_AMBULATORY_CARE_PROVIDER_SITE_OTHER): Payer: Medicare Other | Admitting: Licensed Clinical Social Worker

## 2014-09-01 DIAGNOSIS — F330 Major depressive disorder, recurrent, mild: Secondary | ICD-10-CM | POA: Diagnosis not present

## 2014-09-03 ENCOUNTER — Ambulatory Visit: Payer: Medicare Other | Admitting: Licensed Clinical Social Worker

## 2014-09-03 DIAGNOSIS — H2513 Age-related nuclear cataract, bilateral: Secondary | ICD-10-CM | POA: Diagnosis not present

## 2014-09-03 DIAGNOSIS — H524 Presbyopia: Secondary | ICD-10-CM | POA: Diagnosis not present

## 2014-09-03 DIAGNOSIS — H52223 Regular astigmatism, bilateral: Secondary | ICD-10-CM | POA: Diagnosis not present

## 2014-09-03 DIAGNOSIS — H5213 Myopia, bilateral: Secondary | ICD-10-CM | POA: Diagnosis not present

## 2014-09-09 ENCOUNTER — Encounter: Payer: Self-pay | Admitting: Internal Medicine

## 2014-09-11 ENCOUNTER — Ambulatory Visit (INDEPENDENT_AMBULATORY_CARE_PROVIDER_SITE_OTHER): Payer: Medicare Other | Admitting: Licensed Clinical Social Worker

## 2014-09-11 DIAGNOSIS — F330 Major depressive disorder, recurrent, mild: Secondary | ICD-10-CM | POA: Diagnosis not present

## 2014-09-18 ENCOUNTER — Other Ambulatory Visit (INDEPENDENT_AMBULATORY_CARE_PROVIDER_SITE_OTHER): Payer: Medicare Other

## 2014-09-18 DIAGNOSIS — R7989 Other specified abnormal findings of blood chemistry: Secondary | ICD-10-CM | POA: Diagnosis not present

## 2014-09-18 DIAGNOSIS — R946 Abnormal results of thyroid function studies: Secondary | ICD-10-CM

## 2014-09-18 DIAGNOSIS — R945 Abnormal results of liver function studies: Secondary | ICD-10-CM

## 2014-09-18 LAB — HEPATIC FUNCTION PANEL
ALT: 29 U/L (ref 0–35)
AST: 25 U/L (ref 0–37)
Albumin: 4 g/dL (ref 3.5–5.2)
Alkaline Phosphatase: 87 U/L (ref 39–117)
BILIRUBIN TOTAL: 0.4 mg/dL (ref 0.2–1.2)
Bilirubin, Direct: 0.1 mg/dL (ref 0.0–0.3)
Total Protein: 7 g/dL (ref 6.0–8.3)

## 2014-09-18 LAB — TSH: TSH: 2.05 u[IU]/mL (ref 0.35–4.50)

## 2014-09-22 ENCOUNTER — Encounter: Payer: Self-pay | Admitting: Internal Medicine

## 2014-09-22 ENCOUNTER — Ambulatory Visit (INDEPENDENT_AMBULATORY_CARE_PROVIDER_SITE_OTHER): Payer: Medicare Other | Admitting: Internal Medicine

## 2014-09-22 VITALS — BP 140/88 | HR 65 | Temp 98.2°F | Resp 16 | Ht 63.0 in | Wt 172.8 lb

## 2014-09-22 DIAGNOSIS — R946 Abnormal results of thyroid function studies: Secondary | ICD-10-CM

## 2014-09-22 DIAGNOSIS — M7542 Impingement syndrome of left shoulder: Secondary | ICD-10-CM

## 2014-09-22 DIAGNOSIS — G5601 Carpal tunnel syndrome, right upper limb: Secondary | ICD-10-CM

## 2014-09-22 DIAGNOSIS — R7989 Other specified abnormal findings of blood chemistry: Secondary | ICD-10-CM

## 2014-09-22 DIAGNOSIS — T300 Burn of unspecified body region, unspecified degree: Secondary | ICD-10-CM

## 2014-09-22 DIAGNOSIS — R945 Abnormal results of liver function studies: Secondary | ICD-10-CM

## 2014-09-22 DIAGNOSIS — H11441 Conjunctival cysts, right eye: Secondary | ICD-10-CM | POA: Diagnosis not present

## 2014-09-22 DIAGNOSIS — H05811 Cyst of right orbit: Secondary | ICD-10-CM | POA: Diagnosis not present

## 2014-09-22 MED ORDER — LEVOTHYROXINE SODIUM 25 MCG PO TABS: 25.0000 ug | ORAL_TABLET | Freq: Every day | ORAL | Status: DC

## 2014-09-22 NOTE — Assessment & Plan Note (Addendum)
No change in L thyroxine dose ; recheck TSH  in 6 mos

## 2014-09-22 NOTE — Assessment & Plan Note (Signed)
Trial of wrist splint; surgery if no better

## 2014-09-22 NOTE — Patient Instructions (Signed)
Minimal Blood Pressure Goal= AVERAGE < 140/90;  Ideal is an AVERAGE < 135/85. This AVERAGE should be calculated from @ least 5-7 BP readings taken @ different times of day on different days of week. You should not respond to isolated BP readings , but rather the AVERAGE for that week .Please bring your  blood pressure cuff to office visits to verify that it is reliable.It  can also be checked against the blood pressure device at the pharmacy. Finger or wrist cuffs are not dependable; an arm cuff is.  Please see Dr. Gershon Crane if the mucocele fails to resolve or if it increases in size or is associated with redness or purulence.  Wear a wrist splint overnight on the R hand as we discussed

## 2014-09-22 NOTE — Assessment & Plan Note (Signed)
Resolved

## 2014-09-22 NOTE — Assessment & Plan Note (Signed)
Continue drops Ophth F/U if no better

## 2014-09-22 NOTE — Progress Notes (Signed)
Pre visit review using our clinic review tool, if applicable. No additional management support is needed unless otherwise documented below in the visit note. 
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
## 2014-09-23 ENCOUNTER — Encounter: Payer: Medicare Other | Admitting: Internal Medicine

## 2014-09-23 ENCOUNTER — Telehealth: Payer: Self-pay

## 2014-09-23 NOTE — Assessment & Plan Note (Signed)
Follow up with Orthopedist after assessing response of CTS to wrist splint trial

## 2014-09-23 NOTE — Telephone Encounter (Signed)
Dr Linna Darner,   Is it Dr Marya Amsler social worker with [facility]?  When I look up that name that is what I am bringing up?

## 2014-09-23 NOTE — Telephone Encounter (Signed)
-----   Message from Hendricks Limes, MD sent at 09/23/2014  6:19 AM EST ----- Please FAX office visit 09/22/14 results to Dr Marya Amsler, Psychology

## 2014-09-23 NOTE — Telephone Encounter (Signed)
Recent office note and labs have been faxed to [phone]

## 2014-09-23 NOTE — Telephone Encounter (Signed)
yes

## 2014-09-25 ENCOUNTER — Ambulatory Visit (INDEPENDENT_AMBULATORY_CARE_PROVIDER_SITE_OTHER): Payer: Medicare Other | Admitting: Licensed Clinical Social Worker

## 2014-09-25 DIAGNOSIS — F330 Major depressive disorder, recurrent, mild: Secondary | ICD-10-CM | POA: Diagnosis not present

## 2014-10-12 ENCOUNTER — Ambulatory Visit (INDEPENDENT_AMBULATORY_CARE_PROVIDER_SITE_OTHER): Payer: Medicare Other | Admitting: Licensed Clinical Social Worker

## 2014-10-12 DIAGNOSIS — F330 Major depressive disorder, recurrent, mild: Secondary | ICD-10-CM | POA: Diagnosis not present

## 2014-10-19 ENCOUNTER — Encounter: Payer: Self-pay | Admitting: Internal Medicine

## 2014-10-26 ENCOUNTER — Other Ambulatory Visit: Payer: Self-pay

## 2014-10-26 ENCOUNTER — Ambulatory Visit (INDEPENDENT_AMBULATORY_CARE_PROVIDER_SITE_OTHER): Payer: Medicare Other | Admitting: Licensed Clinical Social Worker

## 2014-10-26 DIAGNOSIS — R7989 Other specified abnormal findings of blood chemistry: Secondary | ICD-10-CM

## 2014-10-26 DIAGNOSIS — F330 Major depressive disorder, recurrent, mild: Secondary | ICD-10-CM | POA: Diagnosis not present

## 2014-10-26 MED ORDER — LEVOTHYROXINE SODIUM 25 MCG PO TABS: 25.0000 ug | ORAL_TABLET | Freq: Every day | ORAL | Status: DC

## 2014-10-30 ENCOUNTER — Telehealth: Payer: Self-pay | Admitting: Gastroenterology

## 2014-10-30 NOTE — Telephone Encounter (Signed)
Lower abd pain, she is having loose stools, no blood or mucous, does not relieve the pain.  Sore to touch in the lower abd area.  Pt last saw Cecille Rubin and does not want to wait to see Dr Ardis Hughs so she request to see Cecille Rubin again.  Appt scheduled for 11/02/14

## 2014-11-02 ENCOUNTER — Encounter: Payer: Self-pay | Admitting: Physician Assistant

## 2014-11-02 ENCOUNTER — Ambulatory Visit (INDEPENDENT_AMBULATORY_CARE_PROVIDER_SITE_OTHER): Payer: Medicare Other | Admitting: Physician Assistant

## 2014-11-02 ENCOUNTER — Other Ambulatory Visit (INDEPENDENT_AMBULATORY_CARE_PROVIDER_SITE_OTHER): Payer: Medicare Other

## 2014-11-02 VITALS — BP 112/64 | HR 70 | Ht 62.5 in | Wt 169.0 lb

## 2014-11-02 DIAGNOSIS — K529 Noninfective gastroenteritis and colitis, unspecified: Secondary | ICD-10-CM

## 2014-11-02 DIAGNOSIS — R197 Diarrhea, unspecified: Secondary | ICD-10-CM

## 2014-11-02 LAB — CBC WITH DIFFERENTIAL/PLATELET
Basophils Absolute: 0 10*3/uL (ref 0.0–0.1)
Basophils Relative: 0.5 % (ref 0.0–3.0)
EOS ABS: 0.2 10*3/uL (ref 0.0–0.7)
Eosinophils Relative: 2.7 % (ref 0.0–5.0)
HEMATOCRIT: 35.9 % — AB (ref 36.0–46.0)
Hemoglobin: 12.4 g/dL (ref 12.0–15.0)
LYMPHS PCT: 22.7 % (ref 12.0–46.0)
Lymphs Abs: 1.7 10*3/uL (ref 0.7–4.0)
MCHC: 34.4 g/dL (ref 30.0–36.0)
MCV: 85.5 fl (ref 78.0–100.0)
MONOS PCT: 7.1 % (ref 3.0–12.0)
Monocytes Absolute: 0.5 10*3/uL (ref 0.1–1.0)
Neutro Abs: 5.1 10*3/uL (ref 1.4–7.7)
Neutrophils Relative %: 67 % (ref 43.0–77.0)
Platelets: 280 10*3/uL (ref 150.0–400.0)
RBC: 4.2 Mil/uL (ref 3.87–5.11)
RDW: 13 % (ref 11.5–15.5)
WBC: 7.6 10*3/uL (ref 4.0–10.5)

## 2014-11-02 MED ORDER — SACCHAROMYCES BOULARDII 250 MG PO CAPS: 250.0000 mg | ORAL_CAPSULE | Freq: Two times a day (BID) | ORAL | Status: DC

## 2014-11-02 NOTE — Progress Notes (Signed)
Patient ID: [name], female   DOB: 1945/09/03, 69 y.o.   MRN: [id number]     History of Present Illness: [name] is a pleasant 69 year old female with a history of obstructive sleep apnea, allergic rhinitis, hypertension, diverticulosis, GERD, carpal tunnel syndrome, lower back pain with radiculopathy, osteopenia, costochondritis, renal calculi, ankylosing spondylitis, and IBS. She was last seen here in December with left lower quadrant abdominal pain. She had had a colonoscopy in December 2014 that revealed some left colon diverticuli .At her last visit she was empirically given treatment for diverticulitis with Cipro and Flagyl however she never filled the prescriptions because she read on the package insert that Cipro can in rare instances causeseizures and she didn't want to have seizure. Her pain seemed to get better on its own and she felt fairly well until Wednesday, March 30 when she ate at the [employer] in [place]. She had multiple fried foods macaroni and cheese amongst other healthy items and 30 minutes thereafter began to develop gas, cramping, lower abdominal pain and diarrhea. That night she felt chills and felt clammy. Thursday she had to stay in bed all day and had lower abdominal cramps and diarrhea. Friday she did not feel well and continued to have loose bowels over the weekend she continued to have intermittent lower abdominal cramping with loose bowels. She has had no bright red blood per rectum or melena. This morning she had a very thin half inch in diameter long soft streaming stool per her description. She continues to have lower abdominal pain that she rates as a 6 on a scale of 1-10. She has no nausea or vomiting. She has had no measurable fever.   Past Medical History  Diagnosis Date  . Hypertension   . Allergy     seasonal  . GERD (gastroesophageal reflux disease)   . Hyperlipidemia     NMR 2005  . Osteopenia     [facility]  . Hx of colonic polyps   . Diverticulosis of colon   . Interstitial cystitis   . Vitamin D deficiency   . Xerostomia     and Xeroophthalmia  . Arthritis   . Dry eye syndrome   . Osteoarthritis   . Cystitis   . Incontinence   . Ankylosing spondylitis   . HLA B27 (HLA B27 positive)   . Frozen shoulder   . Bell palsy   . Anxiety   . Osteopenia   . Subjective visual disturbance of both eyes 12/30/2013  . Menopause   . Sleep apnea   . CTS (carpal tunnel syndrome)     Past Surgical History  Procedure Laterality Date  . Total abdominal hysterectomy w/ bilateral salpingoophorectomy      dysfunctional menses  . Bladder surgery      for incontinence  . G 1 p 1    . Colonscopy      tics 10-2002  . Interstitial cystitis      with clot in catheter  . Elevated lfts      due to Elmiron  . Colonoscopy w/ polypectomy      adenomatous poyp 04-2008  . Finger surgery Left 2004    2nd finger  . Cystoscopy with retrograde pyelogram, ureteroscopy and stent placement Left 09/01/2013    Procedure: CYSTOSCOPY WITH RETROGRADE PYELOGRAM, AND LEFT STENT PLACEMENT;  Surgeon: Molli Hazard, MD;  Location: [facility];  Service: Urology;  Laterality: Left;  . Abdominal  hysterectomy     Family History  Problem Relation Age of Onset  . Coronary artery disease Mother   . Hypertension Mother   . Asthma Mother   . Osteoporosis Mother   . Dementia Mother   . Stroke Mother     mini cva  . Diabetes Father   . Cirrhosis Father     non alcoholic  . Cancer Sister     colon  . Colon cancer Sister 71  . Cancer Paternal Aunt     2 aunts had renal cancer  . Coronary artery disease Maternal Grandfather   . Cancer Maternal Aunt     breast  . Diabetes Paternal Aunt   . Diabetes Paternal Grandmother    History  Substance Use Topics  . Smoking status: Never Smoker   . Smokeless tobacco: Never Used  . Alcohol Use: No   Current Outpatient Prescriptions  Medication Sig Dispense Refill    . aspirin 81 MG tablet Take 81 mg by mouth daily.      Marland Kitchen BIOTIN PO Take 2,500 mg by mouth daily.    . cetirizine (ZYRTEC) 10 MG tablet Take 10 mg by mouth daily.      . Cholecalciferol (VITAMIN D-3) 5000 UNITS TABS Take 5,000 Units by mouth 2 (two) times daily.     . cycloSPORINE (RESTASIS) 0.05 % ophthalmic emulsion Place 1 drop into both eyes 2 (two) times daily.     . hyoscyamine (LEVSIN) 0.125 MG/5ML ELIX Take 0.125 mg by mouth every 4 (four) hours as needed. Pt taking generic form: Oscimin SL tabs 0.125 mg    . ibuprofen (ADVIL,MOTRIN) 800 MG tablet Take 800 mg by mouth 3 (three) times daily as needed.    Marland Kitchen levothyroxine (LEVOTHROID) 25 MCG tablet Take 1 tablet (25 mcg total) by mouth daily before breakfast. 90 tablet 1  . metoprolol succinate (TOPROL-XL) 50 MG 24 hr tablet TAKE ONE-HALF (1/2) TABLET DAILY 45 tablet 3  . Polyethyl Glycol-Propyl Glycol (SYSTANE OP) Apply 1 drop to eye as needed. Apply to both eyes as needed    . vitamin E 400 UNIT capsule Take 400 Units by mouth daily.     No current facility-administered medications for this visit.   Allergies  Allergen Reactions  . Nitroglycerin Anaphylaxis    Hypotensive after NTG administration in ER during chest pain evaluation  . Pentosan Polysulfate Sodium     Elevated LFTs.......Marland Kitchen elmiron   . Bactrim [Sulfamethoxazole-Trimethoprim]   . Promethazine Hcl     "jittery on the inside"  . Lisinopril Cough  . Sulfamethoxazole-Trimethoprim Nausea Only      Review of Systems: Gen: Denies any fever, chills, sweats, anorexia, fatigue, weakness, malaise, weight loss, and sleep disorder CV: Denies chest pain, angina, palpitations, syncope, orthopnea, PND, peripheral edema, and claudication. Resp: Denies dyspnea at rest, dyspnea with exercise, cough, sputum, wheezing, coughing up blood, and pleurisy. GI: Denies vomiting blood, jaundice, and fecal incontinence.   Denies dysphagia or odynophagia. GU : Denies urinary burning, blood in  urine, urinary frequency, urinary hesitancy, nocturnal urination, and urinary incontinence. MS: Denies joint pain, limitation of movement, and swelling, stiffness, low back pain, extremity pain. Denies muscle weakness, cramps, atrophy.  Derm: Denies rash, itching, dry skin, hives, moles, warts, or unhealing ulcers.  Psych: Denies depression, anxiety, memory loss, suicidal ideation, hallucinations, paranoia, and confusion. Heme: Denies bruising, bleeding, and enlarged lymph nodes. Neuro:  Denies any headaches, dizziness, paresthesia Endo:  Denies any problems with DM, thyroid, adrenal  Physical Exam: General: Pleasant, well developed  female in no acute distress Head: Normocephalic and atraumatic Eyes:  sclerae anicteric, conjunctiva pink  Ears: Normal auditory acuity Lungs: Clear throughout to auscultation Heart: Regular rate and rhythm Abdomen: Soft, non distended, mild to moderate tenderness LLQ and suprapubic area. No masses, no hepatomegaly. Normal bowel sounds, no rebound, no guarding, no CVAT Musculoskeletal: Symmetrical with no gross deformities  Extremities: No edema  Neurological: Alert oriented x 4, grossly nonfocal Psychological:  Alert and cooperative. Normal mood and affect  Assessment and Recommendations: 69 year old female with a 5 day history of lower abdominal pain after eating at a buffet's diarrheal restaurant here for evaluation. She may have a gastroenteritis that has been prominent recently in the community, or possibly poisoning. A stool culture, and GI pathogen panel will be obtained. She has been instructed to adhere to a clear liquid diet for 24 hours and then a brat diet for 48 hours then advance slowly as tolerated she will try Flora store 250 mg 1 by mouth twice a day 28 days. A CBC will be obtained today she has been instructed to call us in 3 days if her symptoms are not improving. We have offered an empiric trial of an antibiotic however the patient is  afraid that any antibiotic she may given will cause seizures so she prefers not to take any antibiotics.        Llewellyn Choplin, Deloris Ping 11/02/2014,

## 2014-11-02 NOTE — Patient Instructions (Addendum)
Your physician has requested that you go to the basement for the lab work before leaving today.  Today you have been given a clear liquid diet booklet to use for 24 hours, then use the BRAT diet (bananas, rice, applesauce, toast) for 48 hours then advance slowly as tolerated.   Purchase Florastor 250 mg and take one pill twice a day for 28 days.  Also sent rx in which might save you some money.  Please purchase benefiber and take 1 tablespoon daily.  Call us in 3 days if not improving please.     I appreciate the opportunity to care for you.

## 2014-11-03 NOTE — Progress Notes (Signed)
i agree with the above note, plan 

## 2014-11-04 ENCOUNTER — Other Ambulatory Visit: Payer: Medicare Other

## 2014-11-04 DIAGNOSIS — K529 Noninfective gastroenteritis and colitis, unspecified: Secondary | ICD-10-CM

## 2014-11-04 DIAGNOSIS — R197 Diarrhea, unspecified: Secondary | ICD-10-CM | POA: Diagnosis not present

## 2014-11-05 LAB — OVA AND PARASITE EXAMINATION: OP: NONE SEEN

## 2014-11-06 LAB — GASTROINTESTINAL PATHOGEN PANEL PCR
C. difficile Tox A/B, PCR: NEGATIVE
Campylobacter, PCR: NEGATIVE
Cryptosporidium, PCR: NEGATIVE
E coli (ETEC) LT/ST PCR: NEGATIVE
E coli (STEC) stx1/stx2, PCR: NEGATIVE
E coli 0157, PCR: NEGATIVE
GIARDIA LAMBLIA, PCR: NEGATIVE
Norovirus, PCR: NEGATIVE
Rotavirus A, PCR: NEGATIVE
SALMONELLA, PCR: NEGATIVE
SHIGELLA, PCR: NEGATIVE

## 2014-11-08 LAB — STOOL CULTURE

## 2014-11-09 ENCOUNTER — Ambulatory Visit (INDEPENDENT_AMBULATORY_CARE_PROVIDER_SITE_OTHER): Payer: Medicare Other | Admitting: Licensed Clinical Social Worker

## 2014-11-09 DIAGNOSIS — F330 Major depressive disorder, recurrent, mild: Secondary | ICD-10-CM

## 2014-11-27 ENCOUNTER — Ambulatory Visit (INDEPENDENT_AMBULATORY_CARE_PROVIDER_SITE_OTHER): Payer: Medicare Other | Admitting: Nurse Practitioner

## 2014-11-27 ENCOUNTER — Encounter: Payer: Self-pay | Admitting: Nurse Practitioner

## 2014-11-27 VITALS — BP 142/78 | HR 63 | Temp 97.8°F | Ht 62.5 in | Wt 168.0 lb

## 2014-11-27 DIAGNOSIS — R110 Nausea: Secondary | ICD-10-CM | POA: Diagnosis not present

## 2014-11-27 DIAGNOSIS — J029 Acute pharyngitis, unspecified: Secondary | ICD-10-CM | POA: Diagnosis not present

## 2014-11-27 LAB — POCT RAPID STREP A (OFFICE): Rapid Strep A Screen: NEGATIVE

## 2014-11-27 MED ORDER — ONDANSETRON 8 MG PO TBDP: 8.0000 mg | ORAL_TABLET | Freq: Two times a day (BID) | ORAL | Status: DC

## 2014-11-27 NOTE — Progress Notes (Signed)
Pre visit review using our clinic review tool, if applicable. No additional management support is needed unless otherwise documented below in the visit note. 

## 2014-11-27 NOTE — Patient Instructions (Addendum)
You seem to have a flu-like illness. The average duration is 5-10 days. Treatment is largely symptom management. If your throat culture is pos, I will start antibiotic.  For sinus congestion, start daily sinus rinses (neilmed Sinus Rinse).  For sore throat use benzocaine throat lozenges or spray.  For aches & fever take ibuprophen as directed, as needed. Avoid tylenol/acetaminophenproducts as your liver enzymes have been transiently elevated in past. For nausea take ondansetron as needed. Sip fluids every hour. Rest. If you are not feeling better in 10 days or develop chest pain, call us for re-evaluation. Feel better!    Influenza A (H1N1) H1N1 formerly called "swine flu" is a new influenza virus causing sickness in people. The H1N1 virus is different from seasonal influenza viruses. However, the H1N1 symptoms are similar to seasonal influenza and it is spread from person to person. You may be at higher risk for serious problems if you have underlying serious medical conditions. The CDC and the [facility] are following reported cases around the world. CAUSES   The flu is thought to spread mainly person-to-person through coughing or sneezing of infected people.  A person may become infected by touching something with the virus on it and then touching their mouth or nose. SYMPTOMS   Fever.  Headache.  Tiredness.  Cough.  Sore throat.  Runny or stuffy nose.  Body aches.  Diarrhea and vomiting These symptoms are referred to as "flu-like symptoms." A lot of different illnesses, including the common cold, may have similar symptoms. DIAGNOSIS   There are tests that can tell if you have the H1N1 virus.  Confirmed cases of H1N1 will be reported to the state or local health department.  A doctor's exam may be needed to tell whether you have an infection that is a complication of the flu. HOME CARE INSTRUCTIONS   Stay informed. Visit the [facility] website for current  recommendations. Visit [employer]. You may also call 1-800-CDC-INFO (682) 498-3636).  Get help early if you develop any of the above symptoms.  If you are at high risk from complications of the flu, talk to your caregiver as soon as you develop flu-like symptoms. Those at higher risk for complications include:  People 65 years or older.  People with chronic medical conditions.  Pregnant women.  Young children.  Your caregiver may recommend antiviral medicine to help treat the flu.  If you get the flu, get plenty of rest, drink enough water and fluids to keep your urine clear or pale yellow, and avoid using alcohol or tobacco.  You may take over-the-counter medicine to relieve the symptoms of the flu if your caregiver approves. (Never give aspirin to children or teenagers who have flu-like symptoms, particularly fever). TREATMENT  If you do get sick, antiviral drugs are available. These drugs can make your illness milder and make you feel better faster. Treatment should start soon after illness starts. It is only effective if taken within the first day of becoming ill. Only your caregiver can prescribe antiviral medication.  PREVENTION   Cover your nose and mouth with a tissue or your arm when you cough or sneeze. Throw the tissue away.  Wash your hands often with soap and warm water, especially after you cough or sneeze. Alcohol-based cleaners are also effective against germs.  Avoid touching your eyes, nose or mouth. This is one way germs spread.  Try to avoid contact with sick people. Follow public health advice regarding school closures. Avoid crowds.  Stay home  if you get sick. Limit contact with others to keep from infecting them. People infected with the H1N1 virus may be able to infect others anywhere from 1 day before feeling sick to 5-7 days after getting flu symptoms.  An H1N1 vaccine is available to help protect against the virus. In addition to the H1N1  vaccine, you will need to be vaccinated for seasonal influenza. The H1N1 and seasonal vaccines may be given on the same day. The CDC especially recommends the H1N1 vaccine for:  Pregnant women.  People who live with or care for children younger than 16 months of age.  Health care and emergency services personnel.  Persons between the ages of 89 months through 40 years of age.  People from ages 18 through 78 years who are at higher risk for H1N1 because of chronic health disorders or immune system problems. FACEMASKS In community and home settings, the use of facemasks and N95 respirators are not normally recommended. In certain circumstances, a facemask or N95 respirator may be used for persons at increased risk of severe illness from influenza. Your caregiver can give additional recommendations for facemask use. IN CHILDREN, EMERGENCY WARNING SIGNS THAT NEED URGENT MEDICAL CARE:  Fast breathing or trouble breathing.  Bluish skin color.  Not drinking enough fluids.  Not waking up or not interacting normally.  Being so fussy that the child does not want to be held.  Your child has an oral temperature above 102 F (38.9 C), not controlled by medicine.  Your baby is older than 3 months with a rectal temperature of 102 F (38.9 C) or higher.  Your baby is 41 months old or younger with a rectal temperature of 100.4 F (38 C) or higher.  Flu-like symptoms improve but then return with fever and worse cough. IN ADULTS, EMERGENCY WARNING SIGNS THAT NEED URGENT MEDICAL CARE:  Difficulty breathing or shortness of breath.  Pain or pressure in the chest or abdomen.  Sudden dizziness.  Confusion.  Severe or persistent vomiting.  Bluish color.  You have a oral temperature above 102 F (38.9 C), not controlled by medicine.  Flu-like symptoms improve but return with fever and worse cough. SEEK IMMEDIATE MEDICAL CARE IF:  You or someone you know is experiencing any of the above  symptoms. When you arrive at the emergency center, report that you think you have the flu. You may be asked to wear a mask and/or sit in a secluded area to protect others from getting sick. MAKE SURE YOU:   Understand these instructions.  Will watch your condition.  Will get help right away if you are not doing well or get worse. Some of this information courtesy of the CDC.  Document Released: 01/03/2008 Document Revised: 10/09/2011 Document Reviewed: 01/03/2008 [facility] Patient Information 2014 [place], [place].You likely have flu. The average duration is 5-10 days. Start tamiflu. Treatment symptoms as follows: For sinus congestion, start daily sinus rinses (neilmed Sinus Rinse) & 30 mg to 60 mg pseudoephedrine twice daily.  For sore throat use benzocaine throat lozenges or spray.  For aches & fever alternate tylenol & ibuprophen every 4-6 hours.  For cough, you may use a spoonful of honey thinned with lemon juice or hot tea. Use cough syrup at night.  Sip fluids every hour. Rest. If you are not feeling better in 2 weeks or develop fever or chest pain, call us for re-evaluation. Feel better!   Influenza A (H1N1) H1N1 formerly called "swine flu" is a new influenza virus  causing sickness in people. The H1N1 virus is different from seasonal influenza viruses. However, the H1N1 symptoms are similar to seasonal influenza and it is spread from person to person. You may be at higher risk for serious problems if you have underlying serious medical conditions. The CDC and the [facility] are following reported cases around the world. CAUSES   The flu is thought to spread mainly person-to-person through coughing or sneezing of infected people.  A person may become infected by touching something with the virus on it and then touching their mouth or nose. SYMPTOMS   Fever.  Headache.  Tiredness.  Cough.  Sore throat.  Runny or stuffy nose.  Body aches.  Diarrhea and  vomiting These symptoms are referred to as "flu-like symptoms." A lot of different illnesses, including the common cold, may have similar symptoms. DIAGNOSIS   There are tests that can tell if you have the H1N1 virus.  Confirmed cases of H1N1 will be reported to the state or local health department.  A doctor's exam may be needed to tell whether you have an infection that is a complication of the flu. HOME CARE INSTRUCTIONS   Stay informed. Visit the [facility] website for current recommendations. Visit [employer]. You may also call 1-800-CDC-INFO 403-282-6285).  Get help early if you develop any of the above symptoms.  If you are at high risk from complications of the flu, talk to your caregiver as soon as you develop flu-like symptoms. Those at higher risk for complications include:  People 65 years or older.  People with chronic medical conditions.  Pregnant women.  Young children.  Your caregiver may recommend antiviral medicine to help treat the flu.  If you get the flu, get plenty of rest, drink enough water and fluids to keep your urine clear or pale yellow, and avoid using alcohol or tobacco.  You may take over-the-counter medicine to relieve the symptoms of the flu if your caregiver approves. (Never give aspirin to children or teenagers who have flu-like symptoms, particularly fever). TREATMENT  If you do get sick, antiviral drugs are available. These drugs can make your illness milder and make you feel better faster. Treatment should start soon after illness starts. It is only effective if taken within the first day of becoming ill. Only your caregiver can prescribe antiviral medication.  PREVENTION   Cover your nose and mouth with a tissue or your arm when you cough or sneeze. Throw the tissue away.  Wash your hands often with soap and warm water, especially after you cough or sneeze. Alcohol-based cleaners are also effective against germs.  Avoid touching your  eyes, nose or mouth. This is one way germs spread.  Try to avoid contact with sick people. Follow public health advice regarding school closures. Avoid crowds.  Stay home if you get sick. Limit contact with others to keep from infecting them. People infected with the H1N1 virus may be able to infect others anywhere from 1 day before feeling sick to 5-7 days after getting flu symptoms.  An H1N1 vaccine is available to help protect against the virus. In addition to the H1N1 vaccine, you will need to be vaccinated for seasonal influenza. The H1N1 and seasonal vaccines may be given on the same day. The CDC especially recommends the H1N1 vaccine for:  Pregnant women.  People who live with or care for children younger than 32 months of age.  Health care and emergency services personnel.  Persons between the ages of  6 months through 15 years of age.  People from ages 54 through 76 years who are at higher risk for H1N1 because of chronic health disorders or immune system problems. FACEMASKS In community and home settings, the use of facemasks and N95 respirators are not normally recommended. In certain circumstances, a facemask or N95 respirator may be used for persons at increased risk of severe illness from influenza. Your caregiver can give additional recommendations for facemask use. IN CHILDREN, EMERGENCY WARNING SIGNS THAT NEED URGENT MEDICAL CARE:  Fast breathing or trouble breathing.  Bluish skin color.  Not drinking enough fluids.  Not waking up or not interacting normally.  Being so fussy that the child does not want to be held.  Your child has an oral temperature above 102 F (38.9 C), not controlled by medicine.  Your baby is older than 3 months with a rectal temperature of 102 F (38.9 C) or higher.  Your baby is 46 months old or younger with a rectal temperature of 100.4 F (38 C) or higher.  Flu-like symptoms improve but then return with fever and worse cough. IN ADULTS,  EMERGENCY WARNING SIGNS THAT NEED URGENT MEDICAL CARE:  Difficulty breathing or shortness of breath.  Pain or pressure in the chest or abdomen.  Sudden dizziness.  Confusion.  Severe or persistent vomiting.  Bluish color.  You have a oral temperature above 102 F (38.9 C), not controlled by medicine.  Flu-like symptoms improve but return with fever and worse cough. SEEK IMMEDIATE MEDICAL CARE IF:  You or someone you know is experiencing any of the above symptoms. When you arrive at the emergency center, report that you think you have the flu. You may be asked to wear a mask and/or sit in a secluded area to protect others from getting sick. MAKE SURE YOU:   Understand these instructions.  Will watch your condition.  Will get help right away if you are not doing well or get worse. Some of this information courtesy of the CDC.  Document Released: 01/03/2008 Document Revised: 10/09/2011 Document Reviewed: 01/03/2008 [facility] Patient Information 2014 [place], [place].

## 2014-11-27 NOTE — Progress Notes (Signed)
   Subjective:    Patient ID: [name], female    DOB: October 13, 1945, 69 y.o.   MRN: [id number]  Sinusitis This is a new problem. The current episode started in the past 7 days. The problem has been gradually improving since onset. There has been no fever (chills & diaphroesis). She is experiencing no pain. Associated symptoms include chills, congestion, ear pain, sinus pressure and a sore throat. Pertinent negatives include no coughing, headaches, hoarse voice or sneezing. (Nausea) Past treatments include acetaminophen. The treatment provided mild relief.      Review of Systems  Constitutional: Positive for chills.  HENT: Positive for congestion, ear pain, sinus pressure and sore throat. Negative for hoarse voice and sneezing.   Respiratory: Negative for cough and wheezing.   Neurological: Negative for headaches.       Objective:   Physical Exam  Constitutional: She is oriented to person, place, and time. She appears well-developed and well-nourished. No distress.  HENT:  Head: Normocephalic and atraumatic.  Right Ear: External ear normal.  Left Ear: External ear normal.  Mouth/Throat: Oropharynx is clear and moist. No oropharyngeal exudate.  Eyes: Conjunctivae are normal. Right eye exhibits no discharge. Left eye exhibits no discharge.  Neck: Neck supple. No thyromegaly present.  Cardiovascular: Normal rate, regular rhythm and normal heart sounds.   No murmur heard. Pulmonary/Chest: Effort normal and breath sounds normal.  Lymphadenopathy:    She has cervical adenopathy (tender, but no discrete nodules).  Neurological: She is alert and oriented to person, place, and time.  Skin: Skin is warm and dry.  Psychiatric: She has a normal mood and affect. Her behavior is normal. Thought content normal.  Vitals reviewed.         Assessment & Plan:  1. Sore throat Likely viral - Upper Respiratory Culture - POCT rapid strep A NEG Symptom management  2. Nausea without  vomiting - ondansetron (ZOFRAN-ODT) 8 MG disintegrating tablet; Take 1 tablet (8 mg total) by mouth every 12 (twelve) hours.  Dispense: 20 tablet; Refill: 0  F/u PRN

## 2014-11-30 ENCOUNTER — Telehealth: Payer: Self-pay | Admitting: Nurse Practitioner

## 2014-11-30 ENCOUNTER — Ambulatory Visit (INDEPENDENT_AMBULATORY_CARE_PROVIDER_SITE_OTHER): Payer: Medicare Other | Admitting: Licensed Clinical Social Worker

## 2014-11-30 DIAGNOSIS — F330 Major depressive disorder, recurrent, mild: Secondary | ICD-10-CM

## 2014-11-30 DIAGNOSIS — M2550 Pain in unspecified joint: Secondary | ICD-10-CM | POA: Diagnosis not present

## 2014-11-30 DIAGNOSIS — R768 Other specified abnormal immunological findings in serum: Secondary | ICD-10-CM | POA: Diagnosis not present

## 2014-11-30 DIAGNOSIS — M159 Polyosteoarthritis, unspecified: Secondary | ICD-10-CM | POA: Diagnosis not present

## 2014-11-30 DIAGNOSIS — Z791 Long term (current) use of non-steroidal anti-inflammatories (NSAID): Secondary | ICD-10-CM | POA: Diagnosis not present

## 2014-11-30 LAB — CULTURE, UPPER RESPIRATORY: ORGANISM ID, BACTERIA: NORMAL

## 2014-11-30 NOTE — Telephone Encounter (Signed)
LMOVM for patient to return call concerning lab results.  

## 2014-11-30 NOTE — Telephone Encounter (Signed)
pls call pt: Strep culture neg. Viral respiratory/sore throat illness. Continue with comfort measures as discussed. 

## 2014-11-30 NOTE — Telephone Encounter (Signed)
Patient called back and I informed her of results.

## 2014-12-04 DIAGNOSIS — G5602 Carpal tunnel syndrome, left upper limb: Secondary | ICD-10-CM | POA: Diagnosis not present

## 2014-12-04 DIAGNOSIS — M1811 Unilateral primary osteoarthritis of first carpometacarpal joint, right hand: Secondary | ICD-10-CM | POA: Diagnosis not present

## 2014-12-04 DIAGNOSIS — M47812 Spondylosis without myelopathy or radiculopathy, cervical region: Secondary | ICD-10-CM | POA: Diagnosis not present

## 2014-12-04 DIAGNOSIS — G5601 Carpal tunnel syndrome, right upper limb: Secondary | ICD-10-CM | POA: Diagnosis not present

## 2014-12-04 DIAGNOSIS — M1812 Unilateral primary osteoarthritis of first carpometacarpal joint, left hand: Secondary | ICD-10-CM | POA: Diagnosis not present

## 2014-12-11 DIAGNOSIS — G5602 Carpal tunnel syndrome, left upper limb: Secondary | ICD-10-CM | POA: Diagnosis not present

## 2014-12-11 DIAGNOSIS — G5601 Carpal tunnel syndrome, right upper limb: Secondary | ICD-10-CM | POA: Diagnosis not present

## 2014-12-11 DIAGNOSIS — M47812 Spondylosis without myelopathy or radiculopathy, cervical region: Secondary | ICD-10-CM | POA: Diagnosis not present

## 2014-12-14 ENCOUNTER — Other Ambulatory Visit: Payer: Self-pay | Admitting: Orthopedic Surgery

## 2014-12-17 ENCOUNTER — Encounter (HOSPITAL_BASED_OUTPATIENT_CLINIC_OR_DEPARTMENT_OTHER): Payer: Self-pay | Admitting: *Deleted

## 2014-12-17 ENCOUNTER — Encounter: Payer: Self-pay | Admitting: Internal Medicine

## 2014-12-17 ENCOUNTER — Ambulatory Visit (INDEPENDENT_AMBULATORY_CARE_PROVIDER_SITE_OTHER): Payer: Medicare Other | Admitting: Internal Medicine

## 2014-12-17 ENCOUNTER — Telehealth: Payer: Self-pay | Admitting: Internal Medicine

## 2014-12-17 ENCOUNTER — Other Ambulatory Visit (INDEPENDENT_AMBULATORY_CARE_PROVIDER_SITE_OTHER): Payer: Medicare Other

## 2014-12-17 VITALS — BP 138/80 | HR 64 | Temp 97.8°F | Wt 170.5 lb

## 2014-12-17 DIAGNOSIS — D649 Anemia, unspecified: Secondary | ICD-10-CM

## 2014-12-17 DIAGNOSIS — R002 Palpitations: Secondary | ICD-10-CM

## 2014-12-17 DIAGNOSIS — Z638 Other specified problems related to primary support group: Secondary | ICD-10-CM

## 2014-12-17 DIAGNOSIS — R7989 Other specified abnormal findings of blood chemistry: Secondary | ICD-10-CM | POA: Diagnosis not present

## 2014-12-17 DIAGNOSIS — F439 Reaction to severe stress, unspecified: Secondary | ICD-10-CM

## 2014-12-17 LAB — CBC WITH DIFFERENTIAL/PLATELET
BASOS PCT: 0.7 % (ref 0.0–3.0)
Basophils Absolute: 0 10*3/uL (ref 0.0–0.1)
EOS PCT: 3.8 % (ref 0.0–5.0)
Eosinophils Absolute: 0.2 10*3/uL (ref 0.0–0.7)
HEMATOCRIT: 36.4 % (ref 36.0–46.0)
HEMOGLOBIN: 12.5 g/dL (ref 12.0–15.0)
Lymphocytes Relative: 32.8 % (ref 12.0–46.0)
Lymphs Abs: 1.8 10*3/uL (ref 0.7–4.0)
MCHC: 34.3 g/dL (ref 30.0–36.0)
MCV: 86 fl (ref 78.0–100.0)
MONO ABS: 0.5 10*3/uL (ref 0.1–1.0)
Monocytes Relative: 9.7 % (ref 3.0–12.0)
NEUTROS ABS: 2.9 10*3/uL (ref 1.4–7.7)
Neutrophils Relative %: 53 % (ref 43.0–77.0)
Platelets: 245 10*3/uL (ref 150.0–400.0)
RBC: 4.23 Mil/uL (ref 3.87–5.11)
RDW: 12.9 % (ref 11.5–15.5)
WBC: 5.6 10*3/uL (ref 4.0–10.5)

## 2014-12-17 LAB — BASIC METABOLIC PANEL
BUN: 16 mg/dL (ref 6–23)
CO2: 30 meq/L (ref 19–32)
CREATININE: 1.04 mg/dL (ref 0.40–1.20)
Calcium: 9.7 mg/dL (ref 8.4–10.5)
Chloride: 105 mEq/L (ref 96–112)
GFR: 55.54 mL/min — ABNORMAL LOW (ref >60.00)
Glucose, Bld: 106 mg/dL — ABNORMAL HIGH (ref 70–99)
Potassium: 3.8 mEq/L (ref 3.5–5.1)
Sodium: 140 mEq/L (ref 135–145)

## 2014-12-17 LAB — MAGNESIUM: Magnesium: 2 mg/dL (ref 1.5–2.5)

## 2014-12-17 LAB — IBC PANEL
Iron: 89 ug/dL (ref 42–145)
SATURATION RATIOS: 25.3 % (ref 20.0–50.0)
TRANSFERRIN: 251 mg/dL (ref 212.0–360.0)

## 2014-12-17 NOTE — Telephone Encounter (Signed)
[place] [facility]  Need last labs Faxed over for pt appts   And a the current EKG if she has had one  Fax number [phone] Lattie Haw number [phone]

## 2014-12-17 NOTE — Patient Instructions (Addendum)
  Please stay off the thyroid and recheck full thyroid function test in 10-12 weeks. Avoid stimulants :decongestants, diet pills, nicotine, caffeine( coffee, tea,cola, chocolate) to excess to prevent  palpitations

## 2014-12-17 NOTE — Progress Notes (Signed)
Requested Labs and EKG from Dr. Gwyndolyn Saxon Hopper's office. Bring all medications

## 2014-12-17 NOTE — Telephone Encounter (Signed)
Labs faxed, ekg was in 2014, did not fax ekg

## 2014-12-17 NOTE — Progress Notes (Signed)
   Subjective:    Patient ID: [name], female    DOB: Sep 25, 1945, 69 y.o.   MRN: [id number]  HPI She has had intermittent "fluttering" in her throat & chest which may occur 2-3 times per week. She says she can feel and hear her heartbeat. She does have some associated nausea but no pain.  With the symptoms she feels as if she is not getting enough air. She thought the 25 g Synthroid living causing her symptoms and stopped it 2 days ago. She did not have any of these symptoms yesterday.  TSH was 5.86 on 07/08/14; on the low-dose thyroid 25 g the TSH was 2.05 on 09/18/14.    At times she felt that she hds trouble with articulation of words.She "looked up symptoms of stroke and heart attack". She is under great stress related to her husband's mental status and change in moods since his neurosurgery.We have discussed seeing a Psychologist or taking an SSRI ;but she has been resistant to this.   On 11/02/14 she had mild reduction in the hematocrit to 35.9. Indices were normochromic, normocytic.No bleeding dyscrasias reported.  Review of Systems She also describes diffuse arthralgias & also cramping in her thigh.. She also describes difficulty getting to sleep. She is concerned about possible growth over the right eye. Epistaxis, hemoptysis, hematuria, melena, or rectal bleeding denied. No unexplained weight loss, significant dyspepsia,dysphagia, or abdominal pain.  There is no abnormal bruising , bleeding, or difficulty stopping bleeding with injury.     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  She has minimal lid lag.  She has unsustained vertical nystagmus with extraocular motion.  She has minimal crepitus of the knees. Deep tendon reflexes of the knees are 0-1/2+.  General appearance :adequately nourished; in no distress. Eyes: No conjunctival inflammation or scleral icterus is  present. Oral exam:  Lips and gums are healthy appearing.There is no oropharyngeal erythema or exudate noted. Dental hygiene is good. Heart:  Normal rate and regular rhythm. S1 and S2 normal without gallop, murmur, click, rub or other extra sounds   Lungs:Chest clear to auscultation; no wheezes, rhonchi,rales ,or rubs present.No increased work of breathing.  Abdomen: bowel sounds normal, soft and non-tender without masses, organomegaly or hernias noted.  No guarding or rebound.  Vascular : all pulses equal ; no bruits present. Skin:Warm & dry.  Intact without suspicious lesions or rashes ; no tenting or jaundice  Lymphatic: No lymphadenopathy is noted about the head, neck, axilla Neuro: Strength, tone & DTRs normal.        Assessment & Plan:  #1 palpitations  #2 anemia  #3 exogenous stress  Plan: See orders

## 2014-12-17 NOTE — Progress Notes (Signed)
Pre visit review using our clinic review tool, if applicable. No additional management support is needed unless otherwise documented below in the visit note. 

## 2014-12-18 ENCOUNTER — Other Ambulatory Visit (INDEPENDENT_AMBULATORY_CARE_PROVIDER_SITE_OTHER): Payer: Medicare Other

## 2014-12-18 ENCOUNTER — Telehealth: Payer: Self-pay

## 2014-12-18 DIAGNOSIS — R739 Hyperglycemia, unspecified: Secondary | ICD-10-CM

## 2014-12-18 LAB — HEMOGLOBIN A1C: HEMOGLOBIN A1C: 5.9 % (ref 4.6–6.5)

## 2014-12-18 NOTE — Telephone Encounter (Signed)
Request to add a1c faxed to lab

## 2014-12-18 NOTE — Telephone Encounter (Signed)
-----   Message from Hendricks Limes, MD sent at 12/18/2014  6:57 AM EDT ----- Please add A1c (R73.9)

## 2014-12-21 ENCOUNTER — Ambulatory Visit (INDEPENDENT_AMBULATORY_CARE_PROVIDER_SITE_OTHER): Payer: Medicare Other | Admitting: Licensed Clinical Social Worker

## 2014-12-21 DIAGNOSIS — F330 Major depressive disorder, recurrent, mild: Secondary | ICD-10-CM | POA: Diagnosis not present

## 2014-12-22 ENCOUNTER — Ambulatory Visit (HOSPITAL_BASED_OUTPATIENT_CLINIC_OR_DEPARTMENT_OTHER): Payer: Medicare Other | Admitting: Anesthesiology

## 2014-12-22 ENCOUNTER — Encounter (HOSPITAL_BASED_OUTPATIENT_CLINIC_OR_DEPARTMENT_OTHER): Payer: Self-pay | Admitting: Orthopedic Surgery

## 2014-12-22 ENCOUNTER — Ambulatory Visit (HOSPITAL_BASED_OUTPATIENT_CLINIC_OR_DEPARTMENT_OTHER)
Admission: RE | Admit: 2014-12-22 | Discharge: 2014-12-22 | Disposition: A | Discharge status: Home / Self Care | Payer: Medicare Other | Source: Ambulatory Visit | Attending: Orthopedic Surgery | Admitting: Orthopedic Surgery

## 2014-12-22 ENCOUNTER — Encounter (HOSPITAL_BASED_OUTPATIENT_CLINIC_OR_DEPARTMENT_OTHER)
Admission: RE | Disposition: A | Discharge status: Home / Self Care | Payer: Self-pay | Source: Ambulatory Visit | Attending: Orthopedic Surgery

## 2014-12-22 DIAGNOSIS — M47892 Other spondylosis, cervical region: Secondary | ICD-10-CM | POA: Insufficient documentation

## 2014-12-22 DIAGNOSIS — M13841 Other specified arthritis, right hand: Secondary | ICD-10-CM | POA: Insufficient documentation

## 2014-12-22 DIAGNOSIS — M13842 Other specified arthritis, left hand: Secondary | ICD-10-CM | POA: Diagnosis not present

## 2014-12-22 DIAGNOSIS — G4730 Sleep apnea, unspecified: Secondary | ICD-10-CM | POA: Diagnosis not present

## 2014-12-22 DIAGNOSIS — G5602 Carpal tunnel syndrome, left upper limb: Secondary | ICD-10-CM | POA: Diagnosis not present

## 2014-12-22 DIAGNOSIS — E039 Hypothyroidism, unspecified: Secondary | ICD-10-CM | POA: Insufficient documentation

## 2014-12-22 DIAGNOSIS — Z79899 Other long term (current) drug therapy: Secondary | ICD-10-CM | POA: Diagnosis not present

## 2014-12-22 DIAGNOSIS — G510 Bell's palsy: Secondary | ICD-10-CM | POA: Insufficient documentation

## 2014-12-22 DIAGNOSIS — M109 Gout, unspecified: Secondary | ICD-10-CM | POA: Diagnosis not present

## 2014-12-22 DIAGNOSIS — H04129 Dry eye syndrome of unspecified lacrimal gland: Secondary | ICD-10-CM | POA: Diagnosis not present

## 2014-12-22 DIAGNOSIS — M8580 Other specified disorders of bone density and structure, unspecified site: Secondary | ICD-10-CM | POA: Diagnosis not present

## 2014-12-22 DIAGNOSIS — K219 Gastro-esophageal reflux disease without esophagitis: Secondary | ICD-10-CM | POA: Insufficient documentation

## 2014-12-22 DIAGNOSIS — E559 Vitamin D deficiency, unspecified: Secondary | ICD-10-CM | POA: Diagnosis not present

## 2014-12-22 DIAGNOSIS — G5601 Carpal tunnel syndrome, right upper limb: Secondary | ICD-10-CM | POA: Insufficient documentation

## 2014-12-22 DIAGNOSIS — I10 Essential (primary) hypertension: Secondary | ICD-10-CM | POA: Diagnosis not present

## 2014-12-22 DIAGNOSIS — Z7982 Long term (current) use of aspirin: Secondary | ICD-10-CM | POA: Insufficient documentation

## 2014-12-22 HISTORY — DX: Hypothyroidism, unspecified: E03.9

## 2014-12-22 HISTORY — DX: Calculus of kidney: N20.0

## 2014-12-22 HISTORY — PX: CARPAL TUNNEL RELEASE: SHX101

## 2014-12-22 LAB — POCT HEMOGLOBIN-HEMACUE: HEMOGLOBIN: 12.9 g/dL (ref 12.0–15.0)

## 2014-12-22 SURGERY — CARPAL TUNNEL RELEASE
Anesthesia: Regional | Site: Wrist | Laterality: Left

## 2014-12-22 MED ORDER — PROPOFOL INFUSION 10 MG/ML OPTIME
INTRAVENOUS | Status: DC | PRN
  Administered 2014-12-22: 50 ug/kg/min via INTRAVENOUS

## 2014-12-22 MED ORDER — LIDOCAINE HCL (PF) 0.5 % IJ SOLN
INTRAMUSCULAR | Status: DC | PRN
  Administered 2014-12-22: 30 mL via INTRAVENOUS

## 2014-12-22 MED ORDER — CHLORHEXIDINE GLUCONATE 4 % EX LIQD: 60.0000 mL | Freq: Once | CUTANEOUS | Status: DC

## 2014-12-22 MED ORDER — 0.9 % SODIUM CHLORIDE (POUR BTL) OPTIME
TOPICAL | Status: DC | PRN
  Administered 2014-12-22: 300 mL

## 2014-12-22 MED ORDER — CEFAZOLIN SODIUM-DEXTROSE 2-3 GM-% IV SOLR: 2.0000 g | INTRAVENOUS | Status: DC

## 2014-12-22 MED ORDER — GLYCOPYRROLATE 0.2 MG/ML IJ SOLN: 0.2000 mg | Freq: Once | INTRAMUSCULAR | Status: DC | PRN

## 2014-12-22 MED ORDER — FENTANYL CITRATE (PF) 100 MCG/2ML IJ SOLN: 50.0000 ug | INTRAMUSCULAR | Status: DC | PRN

## 2014-12-22 MED ORDER — LACTATED RINGERS IV SOLN
INTRAVENOUS | Status: DC
  Administered 2014-12-22: 11:00:00 via INTRAVENOUS

## 2014-12-22 MED ORDER — ONDANSETRON HCL 4 MG/2ML IJ SOLN
INTRAMUSCULAR | Status: DC | PRN
  Administered 2014-12-22: 4 mg via INTRAVENOUS

## 2014-12-22 MED ORDER — FENTANYL CITRATE (PF) 100 MCG/2ML IJ SOLN
INTRAMUSCULAR | Status: AC
  Filled 2014-12-22: qty 2

## 2014-12-22 MED ORDER — MIDAZOLAM HCL 2 MG/2ML IJ SOLN: 1.0000 mg | INTRAMUSCULAR | Status: DC | PRN

## 2014-12-22 MED ORDER — TRAMADOL HCL 50 MG PO TABS: 50.0000 mg | ORAL_TABLET | Freq: Four times a day (QID) | ORAL | Status: DC | PRN

## 2014-12-22 MED ORDER — FENTANYL CITRATE (PF) 100 MCG/2ML IJ SOLN
INTRAMUSCULAR | Status: DC | PRN
  Administered 2014-12-22: 100 ug via INTRAVENOUS

## 2014-12-22 MED ORDER — BUPIVACAINE HCL (PF) 0.25 % IJ SOLN
INTRAMUSCULAR | Status: DC | PRN
  Administered 2014-12-22: 7 mL

## 2014-12-22 MED ORDER — CEFAZOLIN SODIUM-DEXTROSE 2-3 GM-% IV SOLR
2.0000 g | INTRAVENOUS | Status: AC
  Administered 2014-12-22: 2 g via INTRAVENOUS

## 2014-12-22 SURGICAL SUPPLY — 36 items
BLADE SURG 15 STRL LF DISP TIS (BLADE) ×1 IMPLANT
BLADE SURG 15 STRL SS (BLADE) ×1
BNDG COHESIVE 3X5 TAN STRL LF (GAUZE/BANDAGES/DRESSINGS) ×2 IMPLANT
BNDG ESMARK 4X9 LF (GAUZE/BANDAGES/DRESSINGS) IMPLANT
BNDG GAUZE ELAST 4 BULKY (GAUZE/BANDAGES/DRESSINGS) ×2 IMPLANT
CHLORAPREP W/TINT 26ML (MISCELLANEOUS) ×2 IMPLANT
CORDS BIPOLAR (ELECTRODE) ×2 IMPLANT
COVER BACK TABLE 60X90IN (DRAPES) ×2 IMPLANT
COVER MAYO STAND STRL (DRAPES) ×2 IMPLANT
CUFF TOURNIQUET SINGLE 18IN (TOURNIQUET CUFF) ×2 IMPLANT
DRAPE EXTREMITY T 121X128X90 (DRAPE) ×2 IMPLANT
DRAPE SURG 17X23 STRL (DRAPES) ×2 IMPLANT
DRSG PAD ABDOMINAL 8X10 ST (GAUZE/BANDAGES/DRESSINGS) ×2 IMPLANT
GAUZE SPONGE 4X4 12PLY STRL (GAUZE/BANDAGES/DRESSINGS) ×2 IMPLANT
GAUZE XEROFORM 1X8 LF (GAUZE/BANDAGES/DRESSINGS) ×2 IMPLANT
GLOVE BIO SURGEON STRL SZ8.5 (GLOVE) ×2 IMPLANT
GLOVE BIOGEL PI IND STRL 7.0 (GLOVE) ×2 IMPLANT
GLOVE BIOGEL PI IND STRL 8.5 (GLOVE) ×2 IMPLANT
GLOVE BIOGEL PI INDICATOR 7.0 (GLOVE) ×2
GLOVE BIOGEL PI INDICATOR 8.5 (GLOVE) ×2
GLOVE ECLIPSE 6.5 STRL STRAW (GLOVE) ×4 IMPLANT
GLOVE SURG ORTHO 8.0 STRL STRW (GLOVE) ×2 IMPLANT
GOWN STRL REUS W/ TWL LRG LVL3 (GOWN DISPOSABLE) ×1 IMPLANT
GOWN STRL REUS W/ TWL XL LVL3 (GOWN DISPOSABLE) ×1 IMPLANT
GOWN STRL REUS W/TWL LRG LVL3 (GOWN DISPOSABLE) ×1
GOWN STRL REUS W/TWL XL LVL3 (GOWN DISPOSABLE) ×3 IMPLANT
NEEDLE PRECISIONGLIDE 27X1.5 (NEEDLE) ×2 IMPLANT
NS IRRIG 1000ML POUR BTL (IV SOLUTION) ×2 IMPLANT
PACK BASIN DAY SURGERY FS (CUSTOM PROCEDURE TRAY) ×2 IMPLANT
STOCKINETTE 4X48 STRL (DRAPES) ×2 IMPLANT
SUT ETHILON 4 0 PS 2 18 (SUTURE) ×2 IMPLANT
SUT VICRYL 4-0 PS2 18IN ABS (SUTURE) IMPLANT
SYR BULB 3OZ (MISCELLANEOUS) ×2 IMPLANT
SYR CONTROL 10ML LL (SYRINGE) ×2 IMPLANT
TOWEL OR 17X24 6PK STRL BLUE (TOWEL DISPOSABLE) ×2 IMPLANT
UNDERPAD 30X30 (UNDERPADS AND DIAPERS) ×2 IMPLANT

## 2014-12-22 NOTE — Anesthesia Postprocedure Evaluation (Signed)
  Anesthesia Post-op Note  Patient: [name]  Procedure(s) Performed: Procedure(s) (LRB): LEFT CARPAL TUNNEL RELEASE (Left)  Patient Location: PACU  Anesthesia Type: Bier block  Level of Consciousness: awake and alert   Airway and Oxygen Therapy: Patient Spontanous Breathing  Post-op Pain: mild  Post-op Assessment: Post-op Vital signs reviewed, Patient's Cardiovascular Status Stable, Respiratory Function Stable, Patent Airway and No signs of Nausea or vomiting  Last Vitals:  Filed Vitals:   12/22/14 1310  BP: 139/69  Pulse: 53  Temp: 36.5 C  Resp: 16    Post-op Vital Signs: stable   Complications: No apparent anesthesia complications

## 2014-12-22 NOTE — Transfer of Care (Signed)
Immediate Anesthesia Transfer of Care Note  Patient: [name]  Procedure(s) Performed: Procedure(s): LEFT CARPAL TUNNEL RELEASE (Left)  Patient Location: PACU  Anesthesia Type:Bier block  Level of Consciousness: awake, alert , oriented and patient cooperative  Airway & Oxygen Therapy: Patient Spontanous Breathing and Patient connected to face mask oxygen  Post-op Assessment: Report given to RN and Post -op Vital signs reviewed and stable  Post vital signs: Reviewed and stable  Last Vitals:  Filed Vitals:   12/22/14 1027  BP: 163/77  Pulse: 58  Temp: 36.6 C  Resp: 20    Complications: No apparent anesthesia complications

## 2014-12-22 NOTE — Brief Op Note (Signed)
12/22/2014  12:06 PM  PATIENT:  [name]  69 y.o. female  PRE-OPERATIVE DIAGNOSIS:  LEFT CARPAL TUNNEL SYNDROME  POST-OPERATIVE DIAGNOSIS:  LEFT CARPAL TUNNEL SYNDROME  PROCEDURE:  Procedure(s): LEFT CARPAL TUNNEL RELEASE (Left)  SURGEON:  Surgeon(s) and Role:    * Daryll Brod, MD - Primary  PHYSICIAN ASSISTANT:   ASSISTANTS: none   ANESTHESIA:   local and regional  EBL:  Total I/O In: 500 [I.V.:500] Out: -   BLOOD ADMINISTERED:none  DRAINS: none   LOCAL MEDICATIONS USED:  BUPIVICAINE   SPECIMEN:  No Specimen  DISPOSITION OF SPECIMEN:  N/A  COUNTS:  YES  TOURNIQUET:   Total Tourniquet Time Documented: Forearm (Left) - 20 minutes Total: Forearm (Left) - 20 minutes   DICTATION: .Other Dictation: Dictation Number [id number]  PLAN OF CARE: Discharge to home after PACU  PATIENT DISPOSITION:  PACU - hemodynamically stable.

## 2014-12-22 NOTE — Op Note (Signed)
Dictation Number [id number]

## 2014-12-22 NOTE — Discharge Instructions (Addendum)

## 2014-12-22 NOTE — Anesthesia Preprocedure Evaluation (Signed)
Anesthesia Evaluation  Patient identified by MRN, date of birth, ID band Patient awake    Reviewed: Allergy & Precautions, NPO status , Patient's Chart, lab work & pertinent test results  Airway Mallampati: II  TM Distance: >3 FB Neck ROM: Full    Dental no notable dental hx.    Pulmonary sleep apnea ,  breath sounds clear to auscultation  Pulmonary exam normal       Cardiovascular hypertension, Pt. on medications and Pt. on home beta blockers Normal cardiovascular examRhythm:Regular Rate:Normal     Neuro/Psych negative neurological ROS  negative psych ROS   GI/Hepatic negative GI ROS, Neg liver ROS,   Endo/Other  Hypothyroidism   Renal/GU negative Renal ROS  negative genitourinary   Musculoskeletal negative musculoskeletal ROS (+)   Abdominal   Peds negative pediatric ROS (+)  Hematology negative hematology ROS (+)   Anesthesia Other Findings   Reproductive/Obstetrics negative OB ROS                             Anesthesia Physical Anesthesia Plan  ASA: II  Anesthesia Plan: Bier Block   Post-op Pain Management:    Induction: Intravenous  Airway Management Planned:   Additional Equipment:   Intra-op Plan:   Post-operative Plan: Extubation in OR  Informed Consent: I have reviewed the patients History and Physical, chart, labs and discussed the procedure including the risks, benefits and alternatives for the proposed anesthesia with the patient or authorized representative who has indicated his/her understanding and acceptance.   Dental advisory given  Plan Discussed with: CRNA and Surgeon  Anesthesia Plan Comments:         Anesthesia Quick Evaluation

## 2014-12-22 NOTE — H&P (Signed)
[name] is a 69 year-old right-hand dominant female who has bilateral carpal tunnel syndromes. She had nerve conductions done three years ago revealing a motor delay of 6.1/left and 7.5/right;  sensory delay of 3/left and 4.6 right with an amplitude diminution to 10/left and 6/right.  She continues to complain of numbness and tingling, right much greater than left, virtually constant.  Driving increase this along with doing her hair and wearing a wrist splint.  She has no history of injury to the hand or neck. She is awakened several nights a week. She has history of thyroid problems, borderline diabetes, history of arthritis and history of gout. She complains of a constant, aching pain, feels that she would like to proceed to have these released.  She was originally scheduled, but her husband had a stroke necessitating her delaying any surgical intervention.She has had her nerve conductions done by Dr. Zebedee Iba and this reveals a significant deterioration on her studies to a motor delay of 7.6 on the left, 9.6 on the right, sensory delay of 2.8 on the left and 4.1 on the right, amplitude diminution to 11 on the left and 5.3 on the right.   ALLERGIES:     Phenergan, Elmiron, nitroglycerin, Lisinopril and AMZ/TMP DS 800/160. MEDICATIONS:      Bayer aspirin, metoprolol succinate, levothyroxine sodium, Restasis eye drops,      Zyrtec, Oscimin Sensation is intact tabs, biotin, vitamin E, vitamin D, Systane eye      drops and Florastar. SURGICAL HISTORY:     Hysterectomy, bladder repair. FAMILY MEDICAL HISTORY:   Positive for diabetes, high blood pressure and arthritis. SOCIAL HISTORY:     She does not smoke or drink.   She is married, retired Pharmacist, hospital. REVIEW OF SYSTEMS:      Positive for glasses, cataracts, high blood pressure, pneumonia, kidney disease,      nervousness, otherwise negative.    She does have history of Bell's palsy and easy      Bleeding. [name] is an 69 y.o. female.   Chief  Complaint: CTS left HPI: see above  Past Medical History  Diagnosis Date  . Hypertension   . Allergy     seasonal  . GERD (gastroesophageal reflux disease)   . Hyperlipidemia     NMR 2005  . Osteopenia     [facility]  . Hx of colonic polyps   . Diverticulosis of colon   . Interstitial cystitis   . Vitamin D deficiency   . Xerostomia     and Xeroophthalmia  . Arthritis   . Dry eye syndrome   . Osteoarthritis   . Cystitis   . Incontinence   . Ankylosing spondylitis   . HLA B27 (HLA B27 positive)   . Frozen shoulder   . Bell palsy   . Anxiety   . Osteopenia   . Subjective visual disturbance of both eyes 12/30/2013  . Menopause   . Sleep apnea   . CTS (carpal tunnel syndrome)   . Hypothyroidism   . Kidney stone     Past Surgical History  Procedure Laterality Date  . Total abdominal hysterectomy w/ bilateral salpingoophorectomy      dysfunctional menses  . Bladder surgery      for incontinence  . G 1 p 1    . Colonscopy      tics 10-2002  . Interstitial cystitis      with clot in catheter  . Elevated lfts  due to Elmiron  . Colonoscopy w/ polypectomy      adenomatous poyp 04-2008  . Finger surgery Left 2004    2nd finger  . Cystoscopy with retrograde pyelogram, ureteroscopy and stent placement Left 09/01/2013    Procedure: CYSTOSCOPY WITH RETROGRADE PYELOGRAM, AND LEFT STENT PLACEMENT;  Surgeon: Molli Hazard, MD;  Location: [facility];  Service: Urology;  Laterality: Left;  . Abdominal hysterectomy      Family History  Problem Relation Age of Onset  . Coronary artery disease Mother   . Hypertension Mother   . Asthma Mother   . Osteoporosis Mother   . Dementia Mother   . Stroke Mother     mini cva  . Diabetes Father   . Cirrhosis Father     non alcoholic  . Cancer Sister     colon  . Colon cancer Sister 15  . Cancer Paternal Aunt     2 aunts had renal cancer  . Coronary artery disease Maternal Grandfather   . Cancer  Maternal Aunt     breast  . Diabetes Paternal Aunt   . Diabetes Paternal Grandmother    Social History:  reports that she has never smoked. She has never used smokeless tobacco. She reports that she drinks alcohol. She reports that she does not use illicit drugs.  Allergies:  Allergies  Allergen Reactions  . Nitroglycerin Anaphylaxis    Hypotensive after NTG administration in ER during chest pain evaluation  . Pentosan Polysulfate Sodium     Elevated LFTs.......Marland Kitchen elmiron   . Bactrim [Sulfamethoxazole-Trimethoprim]   . Promethazine Hcl     "jittery on the inside"  . Lisinopril Cough  . Sulfamethoxazole-Trimethoprim Nausea Only    No prescriptions prior to admission    No results found for this or any previous visit (from the past 48 hour(s)).  No results found.   Pertinent items are noted in HPI.  Height _0  (1.575 m), weight 76.204 kg (168 lb).  General appearance: alert, cooperative and appears stated age Head: Normocephalic, without obvious abnormality Neck: no JVD Resp: clear to auscultation bilaterally Cardio: regular rate and rhythm, S1, S2 normal, no murmur, click, rub or gallop GI: soft, non-tender; bowel sounds normal; no masses,  no organomegaly Extremities: numbness left hand Pulses: 2+ and symmetric Skin: Skin color, texture, turgor normal. No rashes or lesions Neurologic: Grossly normal Incision/Wound: na  Assessment/Plan RADIOGRAPHS:   X-rays of her cervical spine reveal degenerative changes at multiple levels, foraminal encroachment multiple levels.  X-rays of her hand reveal Eaton stage IV CMC arthritis bilaterally.    DIAGNOSIS:      CMC arthritis, cervical spondylosis, carpal tunnel syndrome.     PLAN:We have discussed the possibility of surgical intervention with her which she would like to proceed to have done. Pre, peri and post op care are discussed along with risks and complications. Patient is aware there is no guarantee with surgery,  possibility of infection, injury to arteries, nerves, and tendons, incomplete relief and dystrophy. She is scheduled for left carpal tunnel release as an outpatient under regional anesthesia at her request.   Alpheus Stiff R 12/22/2014, 9:46 AM

## 2014-12-23 ENCOUNTER — Encounter (HOSPITAL_BASED_OUTPATIENT_CLINIC_OR_DEPARTMENT_OTHER): Payer: Self-pay | Admitting: Orthopedic Surgery

## 2014-12-23 NOTE — Op Note (Signed)
[name], [name]               ACCOUNT NO.:  [id number]  MEDICAL RECORD NO.:  [id number]  LOCATION:                                 FACILITY:  PHYSICIAN:  Daryll Brod, M.D.            DATE OF BIRTH:  DATE OF PROCEDURE:  12/22/2014 DATE OF DISCHARGE:                              OPERATIVE REPORT   PREOPERATIVE DIAGNOSIS:  Carpal tunnel syndrome, left hand.  POSTOPERATIVE DIAGNOSIS:  Carpal tunnel syndrome, left hand.  PROCEDURE:  Decompression, left median nerve.  SURGEON:  Daryll Brod, MD  ANESTHESIA:  Forearm-based IV regional with local infiltration.  ANESTHESIOLOGIST:  Cleon Dew. Rose, MD  HISTORY:  The patient is a 69 year old female with a history of bilateral carpal tunnel syndrome.  Nerve conduction is positive.  She has not responded to conservative treatment.  She has elected to undergo surgical release.  Pre, peri, and postoperative course have been discussed along with risks and complications.  She is aware there is no guarantee with surgery, possibility of infection, recurrence of injury to arteries, nerves, tendons, incomplete relief of symptoms, and dystrophy.  In the preoperative area, the patient is seen, the extremity marked by both patient and surgeon.  Antibiotic given.  PROCEDURE IN DETAIL:  The patient was brought to the operating room, where forearm-based IV regional anesthetic was carried out without difficulty.  She was prepped using ChloraPrep, supine position, left arm free.  A 3-minute dry time was allowed.  Time-out taken, confirming the patient and procedure.  A longitudinal incision was made in the left palm, carried down through subcutaneous tissues.  Bleeders were electrocauterized.  Palmar fascia was split.  Superficial palmar arch identified.  A large hypothenar musculature was noted.  This had to be dissected free by blunt and sharp dissection.  The dissection was carried proximally through the flexor retinaculum protecting the  median nerve and ulnar nerve radially and ulnarly.  A right angle and Sewell retractor were placed between the skin and forearm fascia.  The fascia was released for approximately 3 cm proximal to the wrist crease under direct vision.  Canal was explored.  Air compression to the nerve was apparent.  Motor branch entered into muscle.  No further lesions were identified.  The wound was copiously irrigated with saline, and the skin closed with interrupted 4-0 nylon sutures.  Local infiltration with 0.25% bupivacaine without epinephrine was given, approximately 7 mL was used.  Sterile compressive dressing with the fingers free was applied.  On deflation of the tourniquet, all fingers immediately pinked.  She was taken to the recovery room for observation in satisfactory condition.  She will be discharged home to return to the [facility] in 1 week on Ultram.          ______________________________ Daryll Brod, M.D.     GK/MEDQ  D:  12/22/2014  T:  12/23/2014  Job:  [id number]

## 2015-01-05 DIAGNOSIS — G5602 Carpal tunnel syndrome, left upper limb: Secondary | ICD-10-CM | POA: Diagnosis not present

## 2015-01-11 ENCOUNTER — Ambulatory Visit (INDEPENDENT_AMBULATORY_CARE_PROVIDER_SITE_OTHER): Payer: Medicare Other | Admitting: Licensed Clinical Social Worker

## 2015-01-11 DIAGNOSIS — F330 Major depressive disorder, recurrent, mild: Secondary | ICD-10-CM

## 2015-01-12 DIAGNOSIS — M6281 Muscle weakness (generalized): Secondary | ICD-10-CM | POA: Diagnosis not present

## 2015-01-12 DIAGNOSIS — M25632 Stiffness of left wrist, not elsewhere classified: Secondary | ICD-10-CM | POA: Diagnosis not present

## 2015-01-12 DIAGNOSIS — G5602 Carpal tunnel syndrome, left upper limb: Secondary | ICD-10-CM | POA: Diagnosis not present

## 2015-01-12 DIAGNOSIS — M79642 Pain in left hand: Secondary | ICD-10-CM | POA: Diagnosis not present

## 2015-01-20 ENCOUNTER — Encounter: Payer: Self-pay | Admitting: Internal Medicine

## 2015-01-21 DIAGNOSIS — M25632 Stiffness of left wrist, not elsewhere classified: Secondary | ICD-10-CM | POA: Diagnosis not present

## 2015-01-21 DIAGNOSIS — M79642 Pain in left hand: Secondary | ICD-10-CM | POA: Diagnosis not present

## 2015-01-21 DIAGNOSIS — M6281 Muscle weakness (generalized): Secondary | ICD-10-CM | POA: Diagnosis not present

## 2015-01-21 DIAGNOSIS — G5602 Carpal tunnel syndrome, left upper limb: Secondary | ICD-10-CM | POA: Diagnosis not present

## 2015-01-28 DIAGNOSIS — G5602 Carpal tunnel syndrome, left upper limb: Secondary | ICD-10-CM | POA: Diagnosis not present

## 2015-01-28 DIAGNOSIS — M6281 Muscle weakness (generalized): Secondary | ICD-10-CM | POA: Diagnosis not present

## 2015-01-28 DIAGNOSIS — M79642 Pain in left hand: Secondary | ICD-10-CM | POA: Diagnosis not present

## 2015-01-28 DIAGNOSIS — M25632 Stiffness of left wrist, not elsewhere classified: Secondary | ICD-10-CM | POA: Diagnosis not present

## 2015-02-03 ENCOUNTER — Ambulatory Visit (INDEPENDENT_AMBULATORY_CARE_PROVIDER_SITE_OTHER): Payer: Medicare Other | Admitting: Licensed Clinical Social Worker

## 2015-02-03 DIAGNOSIS — F330 Major depressive disorder, recurrent, mild: Secondary | ICD-10-CM

## 2015-02-04 DIAGNOSIS — M25632 Stiffness of left wrist, not elsewhere classified: Secondary | ICD-10-CM | POA: Diagnosis not present

## 2015-02-04 DIAGNOSIS — G5602 Carpal tunnel syndrome, left upper limb: Secondary | ICD-10-CM | POA: Diagnosis not present

## 2015-02-04 DIAGNOSIS — M79642 Pain in left hand: Secondary | ICD-10-CM | POA: Diagnosis not present

## 2015-02-04 DIAGNOSIS — M6281 Muscle weakness (generalized): Secondary | ICD-10-CM | POA: Diagnosis not present

## 2015-02-11 DIAGNOSIS — H25013 Cortical age-related cataract, bilateral: Secondary | ICD-10-CM | POA: Diagnosis not present

## 2015-02-11 DIAGNOSIS — H04123 Dry eye syndrome of bilateral lacrimal glands: Secondary | ICD-10-CM | POA: Diagnosis not present

## 2015-02-11 DIAGNOSIS — H2513 Age-related nuclear cataract, bilateral: Secondary | ICD-10-CM | POA: Diagnosis not present

## 2015-02-12 ENCOUNTER — Other Ambulatory Visit: Payer: Self-pay | Admitting: Internal Medicine

## 2015-02-16 DIAGNOSIS — M19012 Primary osteoarthritis, left shoulder: Secondary | ICD-10-CM | POA: Diagnosis not present

## 2015-02-16 DIAGNOSIS — M25519 Pain in unspecified shoulder: Secondary | ICD-10-CM | POA: Insufficient documentation

## 2015-02-16 DIAGNOSIS — M25512 Pain in left shoulder: Secondary | ICD-10-CM | POA: Diagnosis not present

## 2015-02-23 DIAGNOSIS — F419 Anxiety disorder, unspecified: Secondary | ICD-10-CM | POA: Diagnosis not present

## 2015-02-23 DIAGNOSIS — N952 Postmenopausal atrophic vaginitis: Secondary | ICD-10-CM | POA: Diagnosis not present

## 2015-02-23 DIAGNOSIS — Z6831 Body mass index (BMI) 31.0-31.9, adult: Secondary | ICD-10-CM | POA: Diagnosis not present

## 2015-02-23 DIAGNOSIS — Z01419 Encounter for gynecological examination (general) (routine) without abnormal findings: Secondary | ICD-10-CM | POA: Diagnosis not present

## 2015-02-24 ENCOUNTER — Ambulatory Visit (INDEPENDENT_AMBULATORY_CARE_PROVIDER_SITE_OTHER): Payer: Medicare Other | Admitting: Licensed Clinical Social Worker

## 2015-02-24 DIAGNOSIS — F330 Major depressive disorder, recurrent, mild: Secondary | ICD-10-CM

## 2015-02-25 ENCOUNTER — Ambulatory Visit (INDEPENDENT_AMBULATORY_CARE_PROVIDER_SITE_OTHER): Payer: Medicare Other | Admitting: Internal Medicine

## 2015-02-25 ENCOUNTER — Encounter: Payer: Self-pay | Admitting: Internal Medicine

## 2015-02-25 ENCOUNTER — Other Ambulatory Visit (INDEPENDENT_AMBULATORY_CARE_PROVIDER_SITE_OTHER): Payer: Medicare Other

## 2015-02-25 VITALS — BP 142/90 | HR 61 | Temp 97.9°F | Resp 16 | Wt 170.0 lb

## 2015-02-25 DIAGNOSIS — R5382 Chronic fatigue, unspecified: Secondary | ICD-10-CM

## 2015-02-25 DIAGNOSIS — M791 Myalgia: Secondary | ICD-10-CM | POA: Diagnosis not present

## 2015-02-25 DIAGNOSIS — M609 Myositis, unspecified: Secondary | ICD-10-CM

## 2015-02-25 DIAGNOSIS — IMO0001 Reserved for inherently not codable concepts without codable children: Secondary | ICD-10-CM

## 2015-02-25 DIAGNOSIS — E162 Hypoglycemia, unspecified: Secondary | ICD-10-CM | POA: Diagnosis not present

## 2015-02-25 DIAGNOSIS — Z23 Encounter for immunization: Secondary | ICD-10-CM | POA: Diagnosis not present

## 2015-02-25 DIAGNOSIS — R42 Dizziness and giddiness: Secondary | ICD-10-CM | POA: Diagnosis not present

## 2015-02-25 DIAGNOSIS — F4323 Adjustment disorder with mixed anxiety and depressed mood: Secondary | ICD-10-CM

## 2015-02-25 DIAGNOSIS — E161 Other hypoglycemia: Secondary | ICD-10-CM

## 2015-02-25 DIAGNOSIS — R609 Edema, unspecified: Secondary | ICD-10-CM

## 2015-02-25 DIAGNOSIS — R61 Generalized hyperhidrosis: Secondary | ICD-10-CM

## 2015-02-25 LAB — HEPATIC FUNCTION PANEL
ALBUMIN: 4.4 g/dL (ref 3.5–5.2)
ALT: 32 U/L (ref 0–35)
AST: 21 U/L (ref 0–37)
Alkaline Phosphatase: 93 U/L (ref 39–117)
BILIRUBIN DIRECT: 0.1 mg/dL (ref 0.0–0.3)
TOTAL PROTEIN: 7.6 g/dL (ref 6.0–8.3)
Total Bilirubin: 0.5 mg/dL (ref 0.2–1.2)

## 2015-02-25 LAB — TSH: TSH: 2.91 u[IU]/mL (ref 0.35–4.50)

## 2015-02-25 LAB — CK: Total CK: 141 U/L (ref 7–177)

## 2015-02-25 NOTE — Progress Notes (Signed)
Pre visit review using our clinic review tool, if applicable. No additional management support is needed unless otherwise documented below in the visit note. 

## 2015-02-25 NOTE — Progress Notes (Signed)
   Subjective:    Patient ID: [name], female    DOB: [birth year], 69 y.o.   MRN: [id number]  HPI She describes essentially constant lightheadedness and dizziness. When she closes her eyes she describes a sensation of floating backwards. Upon standing she feels as if she is moving in a circle. She denies specific symptoms of benign positional vertigo or postural hypotension. There is no associated vertigo.   She has paroxysmal profuse sweating over the head and neck. She describes her scalp as actually wet. She had a total abdominal hysterectomy and bilateral salpingo-oophorectomy in her early 54s. She was on hormonal replacement therapy but this was discontinued She is unsure why.  She describes edema of her hands, legs, ankles, and eyes. She describes generalized fatigue.  Within 30-60 minutes of eating she feels sleepy and w/o energy. She has some nausea but not any vomiting.  Her Gyn Rxed Sertraline; she is seeing a Retail banker. She is under a great deal of stress due to her husband's co-morbidities.  She recently had carpal to surgery on the left hand. She's been treated for left shoulder impingement syndrome. Glucosamine supplement was added. She also had a steroid shot for glenohumeral arthritis.  She had extensive labs 12/17/14. Glucose was 106; A1c was 5.9%. GFR was slightly reduced at 55.54. The remainder the chemistries, electrolytes, and CBC were normal. TSH was 2.05 in February of this year. At that time liver function tests were normal.She is requesting TSH recheck.  Review of Systems  Denied were any change in heart rhythm or rate prior to or with the symptoms. There was no associated chest pain or shortness of breath .  Also specifically denied were headache, limb weakness, tingling, or numbness. No seizure activity noted.  Unexplained weight loss, abdominal pain, significant dyspepsia, dysphagia, melena, rectal bleeding, or persistently small caliber stools are  denied.  She describes pain and cramping thighs and calves.     Objective:   Physical Exam   Pertinent or positive findings include:Neurologic exam : Cn 2-7 intact Strength equal & normal in upper & lower extremities Able to walk on heels and toes.   Balance normal  Romberg normal, finger to nose normal. Deep tendon reflexes are 1/2+ at the knees. She has minor crepitus of the knees.  General appearance :adequately nourished; in no distress.  Eyes: No conjunctival inflammation or scleral icterus is present.  Oral exam:  Lips and gums are healthy appearing.There is no oropharyngeal erythema or exudate noted. Dental hygiene is good.  Heart:  Normal rate and regular rhythm. S1 and S2 normal without gallop, murmur, click, rub or other extra sounds    Lungs:Chest clear to auscultation; no wheezes, rhonchi,rales ,or rubs present.No increased work of breathing.   Abdomen: bowel sounds normal, soft and non-tender without masses, organomegaly or hernias noted.  No guarding or rebound.   Vascular : all pulses equal ; no bruits present.  Skin:Warm & dry.  Intact without suspicious lesions or rashes ; no tenting or jaundice   Lymphatic: No lymphadenopathy is noted about the head, neck, axilla.         Assessment & Plan:  #1 generalized dizziness  #2 possible postprandial hypoglycemia  #3 fatigue  #4 edema, not documented  #5 myalgias  #6 paroxysmal diaphoresis  #7anxiety; seeing counsellor & on SSRI.The pathophysiology of neurotransmitter deficiency was discussed along with the benefits and potential adverse effects of SSRI therapy.  See orders and after visit summary

## 2015-02-25 NOTE — Patient Instructions (Addendum)
Perform isometric exercise of calves  ( while seated go up on toes to count of 5 & then onto heels for 5 count). Repeat  4- 5 times prior to standing if you've been seated or supine for any significant period of time as BP drops with such positions.   Your post meal symptoms could indicate "reactive hypoglycemia", low sugars due to the pancreas excreting excess insulin in response to glucose elevations from " hyperglycemic carbs"  in diet. Eat a low-fat diet with lots of fruits and vegetables, up to 7-9 servings per day. Consume less than 30 grams of sugar per day from foods & drinks with High Fructose Corn Sugar as #1,2,3 or # 4 on label.  She wanted to

## 2015-02-26 ENCOUNTER — Telehealth: Payer: Self-pay | Admitting: Emergency Medicine

## 2015-02-26 NOTE — Telephone Encounter (Signed)
Informed pt to come get 24 hr urine container today, if she is unable to she can come Monday. Pt was instructed to start specimen collection after sweating episode

## 2015-02-26 NOTE — Telephone Encounter (Signed)
-----   Message from Hendricks Limes, MD sent at 02/26/2015  5:48 AM EDT ----- Please ask her to pick up receptacle to collect 24 hour urine for stress hormones the next time she has severe sweating . We need ALL urine for 24 hours after the sweating.

## 2015-03-01 ENCOUNTER — Other Ambulatory Visit: Payer: Medicare Other

## 2015-03-01 ENCOUNTER — Telehealth: Payer: Self-pay | Admitting: Emergency Medicine

## 2015-03-01 DIAGNOSIS — R61 Generalized hyperhidrosis: Secondary | ICD-10-CM | POA: Diagnosis not present

## 2015-03-01 DIAGNOSIS — R42 Dizziness and giddiness: Secondary | ICD-10-CM | POA: Diagnosis not present

## 2015-03-01 DIAGNOSIS — F4323 Adjustment disorder with mixed anxiety and depressed mood: Secondary | ICD-10-CM

## 2015-03-01 NOTE — Telephone Encounter (Signed)
Spoke with pt, instructed pt to call pharm and find out the the price of Zoloft brand rx.

## 2015-03-05 LAB — CATECHOLAMINES, FRACTIONATED, URINE, 24 HOUR
CALCULATED TOTAL (E+ NE): 70 ug/(24.h) (ref 26–121)
Creatinine, Urine mg/day-CATEUR: 1.09 g/(24.h) (ref 0.63–2.50)
DOPAMINE, 24 HR URINE: 187 ug/(24.h) (ref 52–480)
NOREPINEPHRINE, 24 HR UR: 70 ug/(24.h) (ref 15–100)
Total Volume - CF 24Hr U: 900 mL

## 2015-03-05 LAB — METANEPHRINES, URINE, 24 HOUR
METANEPH TOTAL UR: 373 ug/(24.h) (ref 224–832)
Metanephrines, Ur: 30 mcg/24 h — ABNORMAL LOW (ref 90–315)
Normetanephrine, 24H Ur: 343 mcg/24 h (ref 122–676)

## 2015-03-09 ENCOUNTER — Encounter: Payer: Self-pay | Admitting: Internal Medicine

## 2015-03-17 ENCOUNTER — Ambulatory Visit: Payer: Medicare Other | Admitting: Licensed Clinical Social Worker

## 2015-03-24 ENCOUNTER — Ambulatory Visit (INDEPENDENT_AMBULATORY_CARE_PROVIDER_SITE_OTHER): Payer: Medicare Other | Admitting: Licensed Clinical Social Worker

## 2015-03-24 DIAGNOSIS — F330 Major depressive disorder, recurrent, mild: Secondary | ICD-10-CM

## 2015-04-22 DIAGNOSIS — R319 Hematuria, unspecified: Secondary | ICD-10-CM | POA: Diagnosis not present

## 2015-04-22 DIAGNOSIS — R350 Frequency of micturition: Secondary | ICD-10-CM | POA: Diagnosis not present

## 2015-04-22 DIAGNOSIS — I10 Essential (primary) hypertension: Secondary | ICD-10-CM | POA: Diagnosis not present

## 2015-04-27 DIAGNOSIS — R102 Pelvic and perineal pain: Secondary | ICD-10-CM | POA: Diagnosis not present

## 2015-04-27 DIAGNOSIS — R350 Frequency of micturition: Secondary | ICD-10-CM | POA: Diagnosis not present

## 2015-04-28 ENCOUNTER — Ambulatory Visit (INDEPENDENT_AMBULATORY_CARE_PROVIDER_SITE_OTHER): Payer: Medicare Other | Admitting: Licensed Clinical Social Worker

## 2015-04-28 DIAGNOSIS — F330 Major depressive disorder, recurrent, mild: Secondary | ICD-10-CM

## 2015-04-30 ENCOUNTER — Other Ambulatory Visit: Payer: Self-pay | Admitting: Internal Medicine

## 2015-05-06 ENCOUNTER — Telehealth: Payer: Self-pay

## 2015-05-06 NOTE — Telephone Encounter (Signed)
This is your patient and she is requesting to change providers. She wants to see Dr Hilarie Fredrickson as her husband is also trying to establish with Dr Hilarie Fredrickson. Please advise. Thank you.

## 2015-05-07 NOTE — Telephone Encounter (Signed)
That is ok with me if it is OK with Dr. Hilarie Fredrickson

## 2015-05-07 NOTE — Telephone Encounter (Signed)
Dr Hilarie Fredrickson would you like to except this pt? She is a current Dr Ardis Hughs pt. Dr Ardis Hughs has reviewed chart and has left it up to you. Please advise. Thank-you.

## 2015-05-10 NOTE — Telephone Encounter (Signed)
ok 

## 2015-05-12 NOTE — Telephone Encounter (Signed)
Pt notified and aware. Will call after the first of the year to schedule an appointment to establish care.

## 2015-05-18 ENCOUNTER — Encounter: Payer: Self-pay | Admitting: Internal Medicine

## 2015-05-18 ENCOUNTER — Telehealth: Payer: Self-pay | Admitting: Internal Medicine

## 2015-05-18 NOTE — Telephone Encounter (Signed)
Pt is due for a hep c screening, can the order be put in?  She is wanting to come on Friday?

## 2015-05-18 NOTE — Telephone Encounter (Signed)
Please advise 

## 2015-05-19 ENCOUNTER — Other Ambulatory Visit: Payer: Self-pay | Admitting: Internal Medicine

## 2015-05-19 DIAGNOSIS — Z1159 Encounter for screening for other viral diseases: Secondary | ICD-10-CM

## 2015-05-19 NOTE — Telephone Encounter (Signed)
Spoke with pt to inform.  

## 2015-05-19 NOTE — Telephone Encounter (Signed)
Order entered

## 2015-05-20 DIAGNOSIS — M7592 Shoulder lesion, unspecified, left shoulder: Secondary | ICD-10-CM | POA: Diagnosis not present

## 2015-05-20 DIAGNOSIS — M19019 Primary osteoarthritis, unspecified shoulder: Secondary | ICD-10-CM | POA: Diagnosis not present

## 2015-05-21 ENCOUNTER — Other Ambulatory Visit: Payer: Medicare Other

## 2015-05-21 ENCOUNTER — Telehealth: Payer: Self-pay

## 2015-05-21 ENCOUNTER — Ambulatory Visit (INDEPENDENT_AMBULATORY_CARE_PROVIDER_SITE_OTHER): Payer: Medicare Other

## 2015-05-21 DIAGNOSIS — Z23 Encounter for immunization: Secondary | ICD-10-CM | POA: Diagnosis not present

## 2015-05-21 DIAGNOSIS — Z1159 Encounter for screening for other viral diseases: Secondary | ICD-10-CM | POA: Diagnosis not present

## 2015-05-21 NOTE — Telephone Encounter (Signed)
Patient called to educate on Medicare Wellness apt. LVM for the patient to call back to educate and schedule for wellness visit prior to exam date of 11/2 at 1:30pm

## 2015-05-22 LAB — HEPATITIS C ANTIBODY: HCV Ab: NEGATIVE

## 2015-05-24 NOTE — Telephone Encounter (Signed)
fup regarding AWV and [name] stated that her spouse was admitted to the hospital for test and otherwise if he is fine she can come but does not know now. Told her it was optional but had time on Friday if she can come but otherwise can just see Dr. Linna Darner this year.

## 2015-06-02 ENCOUNTER — Encounter: Payer: Self-pay | Admitting: Internal Medicine

## 2015-06-02 ENCOUNTER — Ambulatory Visit (INDEPENDENT_AMBULATORY_CARE_PROVIDER_SITE_OTHER): Payer: Medicare Other | Admitting: Internal Medicine

## 2015-06-02 VITALS — BP 138/98 | HR 65 | Ht 63.0 in | Wt 173.0 lb

## 2015-06-02 DIAGNOSIS — R42 Dizziness and giddiness: Secondary | ICD-10-CM

## 2015-06-02 DIAGNOSIS — E559 Vitamin D deficiency, unspecified: Secondary | ICD-10-CM

## 2015-06-02 DIAGNOSIS — I10 Essential (primary) hypertension: Secondary | ICD-10-CM

## 2015-06-02 DIAGNOSIS — Z86010 Personal history of colonic polyps: Secondary | ICD-10-CM | POA: Diagnosis not present

## 2015-06-02 DIAGNOSIS — E782 Mixed hyperlipidemia: Secondary | ICD-10-CM

## 2015-06-02 NOTE — Patient Instructions (Addendum)
Minimal Blood Pressure Goal= AVERAGE < 140/90;  Ideal is an AVERAGE < 135/85. This AVERAGE should be calculated from @ least 5-7 BP readings taken @ different times of day on different days of week. You should not respond to isolated BP readings , but rather the AVERAGE for that week .Please bring your  blood pressure cuff to office visits to verify that it is reliable.It  can also be checked against the blood pressure device at the pharmacy. Finger or wrist cuffs are not dependable; an arm cuff is.   Your next office appointment will be determined based upon review of your pending labs . Those written interpretation of the lab results and instructions will be transmitted to you by My Chart Critical results will be called.   Perform isometric exercise of calves  ( while seated go up on toes to count of 5 & then onto heels for 5 count). Repeat  4- 5 times prior to standing if you've been seated or supine for any significant period of time as BP drops with such positions.   Followup as needed for any active or acute issue. Please report any significant change in your symptoms.

## 2015-06-02 NOTE — Assessment & Plan Note (Signed)
Blood pressure goals reviewed. BMET 

## 2015-06-02 NOTE — Progress Notes (Signed)
Pre visit review using our clinic review tool, if applicable. No additional management support is needed unless otherwise documented below in the visit note. 

## 2015-06-02 NOTE — Assessment & Plan Note (Signed)
CBC

## 2015-06-02 NOTE — Progress Notes (Signed)
   Subjective:    Patient ID: [name], female    DOB: 1945-12-02, 69 y.o.   MRN: [id number]  HPI The patient is here to assess status of active health conditions.  PMH, FH, & Social History reviewed & updated.No change in [place] as recorded.  She is on a modified heart healthy diet. She does not add salt at the table. She is not exercising. She's been compliant with her beta blocker. Blood pressures range 130 to 140 over the 80s.  She does describe intermittent lightheadedness without any specific cardiac or neurologic prodrome. She denies benign positional vertigo or definite postural hypotension.  She's stayed off her thyroid since her TSH was 2.91 on 02/25/15 off thyroid for at least 2 months.  Colonoscopy was in 2014. She states these are done every 3 years as she's had adenomatous polyps and her sisters had colon cancer.  Review of Systems She continues to have urinary incontinence. She is followed by [facility].  She's had some "peaking" of her nails.  Chest pain, palpitations, tachycardia, exertional dyspnea, paroxysmal nocturnal dyspnea, claudication or edema are absent. No unexplained weight loss, abdominal pain, significant dyspepsia, dysphagia, melena, rectal bleeding, or persistently small caliber stools. Dysuria, pyuria, hematuria, frequency, nocturia or polyuria are denied. Change in hair or skin denied. No bowel changes of constipation or diarrhea. No intolerance to heat or cold.     Objective:   Physical Exam  Pertinent or positive findings include: She has unsustained nystagmus with extraocular motion laterally. She has minor crepitus of the knees. General appearance :adequately nourished; in no distress.  Eyes: No conjunctival inflammation or scleral icterus is present.  Oral exam:  Lips and gums are healthy appearing.There is no oropharyngeal erythema or exudate noted. Dental hygiene is good.  Heart:  Normal rate and regular rhythm. S1 and S2 normal  without gallop, murmur, click, rub or other extra sounds    Lungs:Chest clear to auscultation; no wheezes, rhonchi,rales ,or rubs present.No increased work of breathing.   Abdomen: bowel sounds normal, soft and non-tender without masses, organomegaly or hernias noted.  No guarding or rebound.   Vascular : all pulses equal ; no bruits present.  Skin:Warm & dry.  Intact without suspicious lesions or rashes ; no tenting or jaundice   Lymphatic: No lymphadenopathy is noted about the head, neck, axilla.   Neuro: Strength, tone & DTRs normal.      Assessment & Plan:  #1 See Current Assessment & Plan in Problem List under specific Diagnosis.  #2 lightheadedness without cardiac or neurologic prodrome. No definite postural hypotension or benign positional vertigo by history.

## 2015-06-02 NOTE — Assessment & Plan Note (Signed)
Lipids, LFTs, TSH  

## 2015-06-02 NOTE — Assessment & Plan Note (Signed)
Vitamin D level 

## 2015-06-03 ENCOUNTER — Other Ambulatory Visit (INDEPENDENT_AMBULATORY_CARE_PROVIDER_SITE_OTHER): Payer: Medicare Other

## 2015-06-03 DIAGNOSIS — I10 Essential (primary) hypertension: Secondary | ICD-10-CM | POA: Diagnosis not present

## 2015-06-03 DIAGNOSIS — E782 Mixed hyperlipidemia: Secondary | ICD-10-CM | POA: Diagnosis not present

## 2015-06-03 DIAGNOSIS — Z86010 Personal history of colonic polyps: Secondary | ICD-10-CM

## 2015-06-03 DIAGNOSIS — E559 Vitamin D deficiency, unspecified: Secondary | ICD-10-CM | POA: Diagnosis not present

## 2015-06-03 LAB — LIPID PANEL
CHOL/HDL RATIO: 4
Cholesterol: 180 mg/dL (ref 0–200)
HDL: 50.7 mg/dL (ref >39.00)
LDL CALC: 98 mg/dL (ref 0–99)
NONHDL: 128.93
TRIGLYCERIDES: 154 mg/dL — AB (ref 0.0–149.0)
VLDL: 30.8 mg/dL (ref 0.0–40.0)

## 2015-06-03 LAB — CBC WITH DIFFERENTIAL/PLATELET
BASOS ABS: 0 10*3/uL (ref 0.0–0.1)
BASOS PCT: 0.5 % (ref 0.0–3.0)
EOS ABS: 0.2 10*3/uL (ref 0.0–0.7)
Eosinophils Relative: 3.3 % (ref 0.0–5.0)
HEMATOCRIT: 38.8 % (ref 36.0–46.0)
Hemoglobin: 13 g/dL (ref 12.0–15.0)
LYMPHS PCT: 28.3 % (ref 12.0–46.0)
Lymphs Abs: 1.7 10*3/uL (ref 0.7–4.0)
MCHC: 33.4 g/dL (ref 30.0–36.0)
MCV: 87.7 fl (ref 78.0–100.0)
MONO ABS: 0.5 10*3/uL (ref 0.1–1.0)
Monocytes Relative: 8.5 % (ref 3.0–12.0)
NEUTROS ABS: 3.5 10*3/uL (ref 1.4–7.7)
Neutrophils Relative %: 59.4 % (ref 43.0–77.0)
PLATELETS: 255 10*3/uL (ref 150.0–400.0)
RBC: 4.43 Mil/uL (ref 3.87–5.11)
RDW: 12.8 % (ref 11.5–15.5)
WBC: 5.9 10*3/uL (ref 4.0–10.5)

## 2015-06-03 LAB — HEPATIC FUNCTION PANEL
ALK PHOS: 84 U/L (ref 39–117)
ALT: 28 U/L (ref 0–35)
AST: 27 U/L (ref 0–37)
Albumin: 4.1 g/dL (ref 3.5–5.2)
BILIRUBIN DIRECT: 0.1 mg/dL (ref 0.0–0.3)
BILIRUBIN TOTAL: 0.7 mg/dL (ref 0.2–1.2)
TOTAL PROTEIN: 7.3 g/dL (ref 6.0–8.3)

## 2015-06-03 LAB — BASIC METABOLIC PANEL
BUN: 21 mg/dL (ref 6–23)
CHLORIDE: 107 meq/L (ref 96–112)
CO2: 27 mEq/L (ref 19–32)
Calcium: 9.8 mg/dL (ref 8.4–10.5)
Creatinine, Ser: 0.97 mg/dL (ref 0.40–1.20)
GFR: 60.11 mL/min (ref >60.00)
Glucose, Bld: 102 mg/dL — ABNORMAL HIGH (ref 70–99)
POTASSIUM: 4.2 meq/L (ref 3.5–5.1)
SODIUM: 141 meq/L (ref 135–145)

## 2015-06-03 LAB — TSH: TSH: 1.54 u[IU]/mL (ref 0.35–4.50)

## 2015-06-03 LAB — VITAMIN D 25 HYDROXY (VIT D DEFICIENCY, FRACTURES): VITD: 77.84 ng/mL (ref 30.00–100.00)

## 2015-06-30 DIAGNOSIS — M94212 Chondromalacia, left shoulder: Secondary | ICD-10-CM | POA: Diagnosis not present

## 2015-06-30 DIAGNOSIS — M19012 Primary osteoarthritis, left shoulder: Secondary | ICD-10-CM | POA: Diagnosis not present

## 2015-06-30 DIAGNOSIS — M7502 Adhesive capsulitis of left shoulder: Secondary | ICD-10-CM | POA: Diagnosis not present

## 2015-06-30 DIAGNOSIS — G8918 Other acute postprocedural pain: Secondary | ICD-10-CM | POA: Diagnosis not present

## 2015-06-30 DIAGNOSIS — S46112A Strain of muscle, fascia and tendon of long head of biceps, left arm, initial encounter: Secondary | ICD-10-CM | POA: Diagnosis not present

## 2015-06-30 DIAGNOSIS — M7542 Impingement syndrome of left shoulder: Secondary | ICD-10-CM | POA: Diagnosis not present

## 2015-06-30 DIAGNOSIS — M24112 Other articular cartilage disorders, left shoulder: Secondary | ICD-10-CM | POA: Diagnosis not present

## 2015-06-30 DIAGNOSIS — S43492A Other sprain of left shoulder joint, initial encounter: Secondary | ICD-10-CM | POA: Diagnosis not present

## 2015-07-02 DIAGNOSIS — M25612 Stiffness of left shoulder, not elsewhere classified: Secondary | ICD-10-CM | POA: Diagnosis not present

## 2015-07-02 DIAGNOSIS — Z9889 Other specified postprocedural states: Secondary | ICD-10-CM | POA: Diagnosis not present

## 2015-07-06 ENCOUNTER — Encounter: Payer: Self-pay | Admitting: Neurology

## 2015-07-06 ENCOUNTER — Ambulatory Visit (INDEPENDENT_AMBULATORY_CARE_PROVIDER_SITE_OTHER): Payer: Medicare Other | Admitting: Neurology

## 2015-07-06 ENCOUNTER — Encounter: Payer: Self-pay | Admitting: Internal Medicine

## 2015-07-06 VITALS — BP 142/72 | HR 66 | Resp 16 | Ht 63.0 in | Wt 175.0 lb

## 2015-07-06 DIAGNOSIS — H5310 Unspecified subjective visual disturbances: Secondary | ICD-10-CM | POA: Diagnosis not present

## 2015-07-06 DIAGNOSIS — M25612 Stiffness of left shoulder, not elsewhere classified: Secondary | ICD-10-CM | POA: Diagnosis not present

## 2015-07-06 DIAGNOSIS — Z9889 Other specified postprocedural states: Secondary | ICD-10-CM | POA: Insufficient documentation

## 2015-07-06 NOTE — Progress Notes (Signed)
Reason for visit: Visual disturbance  [name] is an 69 y.o. female  History of present illness:  [name] is a 69 year old right-handed white female with a history of a transient visual disturbance that occurred in June 2015 associated with a left homonymous visual field disturbance associated with bright colors, and geometric figures that came on for about an hour, then disappeared, and was unassociated with headache. The patient underwent MRI evaluation of the brain that showed minimal white matter changes, and mild atrophy. The patient underwent a carotid Doppler study that was unremarkable. She has been on aspirin therapy. She has not had any recurrence of symptoms until 06/25/2015. The patient had another typical event that was associated with the left visual field disturbance. This was very similar to what she had described previously. She had a third episode on 07/01/2015. The patient has had shoulder surgery on the left on November 30, and she had taken hydrocodone for pain on the day of the event. She got up to go the bathroom, felt slightly unsteady, and staggered backwards. She then noted that the left leg felt heavy, she was able to get back to bed, but she felt that she was dragging the left leg. The left leg felt weak for about 1 hour, then cleared. The patient had another visual field event with a left homonymous visual field again associated with geometric figures and bright colored lights. This cleared after about 1 hour, the patient has had no residual deficits. She comes back to the office today for an evaluation.  Past Medical History  Diagnosis Date  . Hypertension   . Allergy     seasonal  . GERD (gastroesophageal reflux disease)   . Hyperlipidemia     NMR 2005  . Osteopenia     [facility]  . Hx of colonic polyps   . Diverticulosis of colon   . Interstitial cystitis   . Vitamin D deficiency   . Xerostomia     and Xeroophthalmia  .  Arthritis   . Dry eye syndrome   . Osteoarthritis   . Cystitis   . Incontinence   . Ankylosing spondylitis ([place])   . HLA B27 (HLA B27 positive)   . Frozen shoulder   . Bell palsy   . Anxiety   . Osteopenia   . Subjective visual disturbance of both eyes 12/30/2013  . Menopause   . Sleep apnea   . CTS (carpal tunnel syndrome)   . Hypothyroidism   . Kidney stone     Past Surgical History  Procedure Laterality Date  . Total abdominal hysterectomy w/ bilateral salpingoophorectomy      dysfunctional menses  . Bladder surgery      for incontinence  . G 1 p 1    . Colonscopy      tics 10-2002  . Interstitial cystitis      with clot in catheter  . Elevated lfts      due to Elmiron  . Colonoscopy w/ polypectomy      adenomatous poyp 04-2008  . Finger surgery Left 2004    2nd finger  . Cystoscopy with retrograde pyelogram, ureteroscopy and stent placement Left 09/01/2013    Procedure: CYSTOSCOPY WITH RETROGRADE PYELOGRAM, AND LEFT STENT PLACEMENT;  Surgeon: Molli Hazard, MD;  Location: [facility];  Service: Urology;  Laterality: Left;  . Abdominal hysterectomy    . Carpal tunnel release Left 12/22/2014    Procedure: LEFT  CARPAL TUNNEL RELEASE;  Surgeon: Daryll Brod, MD;  Location: [facility];  Service: Orthopedics;  Laterality: Left;    Family History  Problem Relation Age of Onset  . Coronary artery disease Mother   . Hypertension Mother   . Asthma Mother   . Osteoporosis Mother   . Dementia Mother   . Stroke Mother     mini cva  . Diabetes Father   . Cirrhosis Father     non alcoholic  . Cancer Sister     colon  . Colon cancer Sister 66  . Cancer Paternal Aunt     2 aunts had renal cancer  . Coronary artery disease Maternal Grandfather   . Cancer Maternal Aunt     breast  . Diabetes Paternal Aunt   . Diabetes Paternal Grandmother     Social history:  reports that she has never smoked. She has never used smokeless tobacco. She reports that she  drinks alcohol. She reports that she does not use illicit drugs.    Allergies  Allergen Reactions  . Nitroglycerin Anaphylaxis    Hypotensive after NTG administration in ER during chest pain evaluation  . Pentosan Polysulfate Sodium     Elevated LFTs.......Marland Kitchen elmiron   . Bactrim [Sulfamethoxazole-Trimethoprim]   . Promethazine Hcl     "jittery on the inside"  . Lisinopril Cough  . Sulfamethoxazole-Trimethoprim Nausea Only    Medications:  Prior to Admission medications   Medication Sig Start Date End Date Taking? Authorizing Provider  aspirin 81 MG tablet Take 81 mg by mouth daily.     Yes Historical Provider, MD  BIOTIN PO Take 2,500 mg by mouth daily.   Yes Historical Provider, MD  cetirizine (ZYRTEC) 10 MG tablet Take 10 mg by mouth daily.     Yes Historical Provider, MD  Cholecalciferol (VITAMIN D-3) 5000 UNITS TABS Take 5,000 Units by mouth 2 (two) times daily.    Yes Historical Provider, MD  hyoscyamine (LEVSIN) 0.125 MG/5ML ELIX Take 0.125 mg by mouth every 4 (four) hours as needed. Pt taking generic form: Oscimin SL tabs 0.125 mg   Yes Historical Provider, MD  metoprolol succinate (TOPROL-XL) 50 MG 24 hr tablet TAKE ONE-HALF (1/2) TABLET DAILY 02/12/15  Yes Hendricks Limes, MD  Misc Natural Products (COSAMIN ASU ADVANCED FORMULA PO) Take 2 each by mouth daily.   Yes Historical Provider, MD  Naproxen Sodium (ALEVE) 220 MG CAPS Take 1 each by mouth as needed.   Yes Historical Provider, MD  Polyethyl Glycol-Propyl Glycol (SYSTANE OP) Apply 1 drop to eye as needed. Apply to both eyes as needed   Yes Historical Provider, MD  vitamin E 400 UNIT capsule Take 400 Units by mouth daily.   Yes Historical Provider, MD  levothyroxine (SYNTHROID, LEVOTHROID) 25 MCG tablet TAKE 1 TABLET DAILY BEFORE BREAKFAST Patient not taking: Reported on 06/02/2015 04/30/15   Hendricks Limes, MD  ondansetron (ZOFRAN-ODT) 8 MG disintegrating tablet Take 1 tablet (8 mg total) by mouth every 12 (twelve)  hours. Patient not taking: Reported on 06/02/2015 11/27/14   Irene Pap, NP    ROS:  Out of a complete 14 system review of symptoms, the patient complains only of the following symptoms, and all other reviewed systems are negative.  Visual disturbance Leg weakness  Blood pressure 142/72, pulse 66, resp. rate 16, height 5' 3"  (1.6 m), weight 175 lb (79.379 kg).  Physical Exam  General: The patient is alert and cooperative at the time of  the examination.  Skin: No significant peripheral edema is noted.   Neurologic Exam  Mental status: The patient is alert and oriented x 3 at the time of the examination. The patient has apparent normal recent and remote memory, with an apparently normal attention span and concentration ability.   Cranial nerves: Facial symmetry is present. Speech is normal, no aphasia or dysarthria is noted. Extraocular movements are full. Visual fields are full.  Motor: The patient has good strength in all 4 extremities.  Sensory examination: Soft touch sensation is symmetric on the face, arms, and legs.  Coordination: The patient has good finger-nose-finger and heel-to-shin bilaterally.  Gait and station: The patient has a normal gait. Tandem gait is normal. Romberg is negative. No drift is seen.  Reflexes: Deep tendon reflexes are symmetric.   Assessment/Plan:  1. Left visual field disturbance, likely migrainous  The patient reports 2 recent episodes of her typical left visual field disturbance. However, with the last event she had some left leg weakness lasting about one hour. We will recheck a MRI of the brain, compared to the prior study done in June 2015. The patient is to remain on aspirin for now. She will follow-up through this office on an as-needed basis. The episodes of visual disturbance again likely are related to migraine equivalent.  Jill Alexanders MD 07/06/2015 6:52 PM  [facility] [address] [place], [place] [address]  Phone [phone] Fax [phone]

## 2015-07-06 NOTE — Patient Instructions (Addendum)
   We will recheck MRI of the brain.   Visual Disturbances You have had a disturbance in your vision. This may be caused by various conditions, such as:  Migraines. Migraine headaches are often preceded by a disturbance in vision. Blind spots or light flashes are followed by a headache. This type of visual disturbance is temporary. It does not damage the eye.  Glaucoma. This is caused by increased pressure in the eye. Symptoms include haziness, blurred vision, or seeing rainbow colored circles when looking at bright lights. Partial or complete visual loss can occur. You may or may not experience eye pain. Visual loss may be gradual or sudden and is irreversible. Glaucoma is the leading cause of blindness.  Retina problems. Vision will be reduced if the retina becomes detached or if there is a circulation problem as with diabetes, high blood pressure, or a mini-stroke. Symptoms include seeing "floaters," flashes of light, or shadows, as if a curtain has fallen over your eye.  Optic nerve problems. The main nerve in your eye can be damaged by redness, soreness, and swelling (inflammation), poor circulation, drugs, and toxins. It is very important to have a complete exam done by a specialist to determine the exact cause of your eye problem. The specialist may recommend medicines or surgery, depending on the cause of the problem. This can help prevent further loss of vision or reduce the risk of having a stroke. Contact the caregiver to whom you have been referred and arrange for follow-up care right away. SEEK IMMEDIATE MEDICAL CARE IF:   Your vision gets worse.  You develop severe headaches.  You have any weakness or numbness in the face, arms, or legs.  You have any trouble speaking or walking.   This information is not intended to replace advice given to you by your health care provider. Make sure you discuss any questions you have with your health care provider.   Document Released:  08/24/2004 Document Revised: 10/09/2011 Document Reviewed: 12/24/2013 Elsevier Interactive Patient Education [employer].

## 2015-07-08 ENCOUNTER — Other Ambulatory Visit: Payer: Self-pay

## 2015-07-08 DIAGNOSIS — Z1231 Encounter for screening mammogram for malignant neoplasm of breast: Secondary | ICD-10-CM

## 2015-07-08 NOTE — Telephone Encounter (Signed)
FYI: for you Hop - Pt spouse contacted, instructions given to titrate down of prednisone and to have labs redrawn. Pt and spouse have appts to continue care with Dr. Quay Burow.

## 2015-07-09 DIAGNOSIS — Z9889 Other specified postprocedural states: Secondary | ICD-10-CM | POA: Diagnosis not present

## 2015-07-09 DIAGNOSIS — M25612 Stiffness of left shoulder, not elsewhere classified: Secondary | ICD-10-CM | POA: Diagnosis not present

## 2015-07-12 DIAGNOSIS — M25612 Stiffness of left shoulder, not elsewhere classified: Secondary | ICD-10-CM | POA: Diagnosis not present

## 2015-07-12 DIAGNOSIS — Z9889 Other specified postprocedural states: Secondary | ICD-10-CM | POA: Diagnosis not present

## 2015-07-15 DIAGNOSIS — M25612 Stiffness of left shoulder, not elsewhere classified: Secondary | ICD-10-CM | POA: Diagnosis not present

## 2015-07-15 DIAGNOSIS — Z9889 Other specified postprocedural states: Secondary | ICD-10-CM | POA: Diagnosis not present

## 2015-07-17 ENCOUNTER — Ambulatory Visit
Admission: RE | Admit: 2015-07-17 | Discharge: 2015-07-17 | Disposition: A | Discharge status: Home / Self Care | Payer: Medicare Other | Source: Ambulatory Visit | Attending: Neurology | Admitting: Neurology

## 2015-07-17 DIAGNOSIS — H5310 Unspecified subjective visual disturbances: Secondary | ICD-10-CM | POA: Diagnosis not present

## 2015-07-18 ENCOUNTER — Telehealth: Payer: Self-pay | Admitting: Neurology

## 2015-07-18 NOTE — Telephone Encounter (Signed)
I called the patient. MRI of the brain shows minimal WM changes. No change from 01/11/15.   MRI brain 07/17/15:  IMPRESSION: This MRI of the brain without contrast shows the following: 1. Mild generalized cortical atrophy and scattered T2 hyperintense foci consistent with mild chronic microvascular ischemic change 2. There are no acute findings.  No change for 01/10/14 scan.

## 2015-07-19 DIAGNOSIS — M25612 Stiffness of left shoulder, not elsewhere classified: Secondary | ICD-10-CM | POA: Diagnosis not present

## 2015-07-19 DIAGNOSIS — Z9889 Other specified postprocedural states: Secondary | ICD-10-CM | POA: Diagnosis not present

## 2015-07-22 DIAGNOSIS — M25612 Stiffness of left shoulder, not elsewhere classified: Secondary | ICD-10-CM | POA: Diagnosis not present

## 2015-07-22 DIAGNOSIS — Z9889 Other specified postprocedural states: Secondary | ICD-10-CM | POA: Diagnosis not present

## 2015-07-28 DIAGNOSIS — Z9889 Other specified postprocedural states: Secondary | ICD-10-CM | POA: Diagnosis not present

## 2015-07-28 DIAGNOSIS — M25612 Stiffness of left shoulder, not elsewhere classified: Secondary | ICD-10-CM | POA: Diagnosis not present

## 2015-07-30 DIAGNOSIS — Z9889 Other specified postprocedural states: Secondary | ICD-10-CM | POA: Diagnosis not present

## 2015-07-30 DIAGNOSIS — M25612 Stiffness of left shoulder, not elsewhere classified: Secondary | ICD-10-CM | POA: Diagnosis not present

## 2015-08-03 DIAGNOSIS — M7502 Adhesive capsulitis of left shoulder: Secondary | ICD-10-CM | POA: Diagnosis not present

## 2015-08-03 DIAGNOSIS — M19012 Primary osteoarthritis, left shoulder: Secondary | ICD-10-CM | POA: Insufficient documentation

## 2015-08-03 DIAGNOSIS — Z9889 Other specified postprocedural states: Secondary | ICD-10-CM | POA: Diagnosis not present

## 2015-08-04 DIAGNOSIS — M7502 Adhesive capsulitis of left shoulder: Secondary | ICD-10-CM | POA: Diagnosis not present

## 2015-08-04 DIAGNOSIS — G8918 Other acute postprocedural pain: Secondary | ICD-10-CM | POA: Diagnosis not present

## 2015-08-05 DIAGNOSIS — Z9889 Other specified postprocedural states: Secondary | ICD-10-CM | POA: Diagnosis not present

## 2015-08-05 DIAGNOSIS — M25612 Stiffness of left shoulder, not elsewhere classified: Secondary | ICD-10-CM | POA: Diagnosis not present

## 2015-08-06 ENCOUNTER — Ambulatory Visit: Payer: Self-pay | Admitting: Internal Medicine

## 2015-08-06 DIAGNOSIS — M25612 Stiffness of left shoulder, not elsewhere classified: Secondary | ICD-10-CM | POA: Diagnosis not present

## 2015-08-06 DIAGNOSIS — Z9889 Other specified postprocedural states: Secondary | ICD-10-CM | POA: Diagnosis not present

## 2015-08-12 ENCOUNTER — Ambulatory Visit
Admission: RE | Admit: 2015-08-12 | Discharge: 2015-08-12 | Disposition: A | Discharge status: Home / Self Care | Payer: Medicare Other | Source: Ambulatory Visit

## 2015-08-12 DIAGNOSIS — Z1231 Encounter for screening mammogram for malignant neoplasm of breast: Secondary | ICD-10-CM

## 2015-08-12 DIAGNOSIS — M25612 Stiffness of left shoulder, not elsewhere classified: Secondary | ICD-10-CM | POA: Diagnosis not present

## 2015-08-12 DIAGNOSIS — Z9889 Other specified postprocedural states: Secondary | ICD-10-CM | POA: Diagnosis not present

## 2015-08-13 DIAGNOSIS — M25612 Stiffness of left shoulder, not elsewhere classified: Secondary | ICD-10-CM | POA: Diagnosis not present

## 2015-08-13 DIAGNOSIS — Z9889 Other specified postprocedural states: Secondary | ICD-10-CM | POA: Diagnosis not present

## 2015-08-13 MED FILL — ONDANSETRON HCL 4 MG TABLET: 4 | 5 days supply | Qty: 20 | Fill #0

## 2015-08-13 MED FILL — traMADol HCL 50 MG TABS: 50 | 23 days supply | Qty: 90 | Fill #0

## 2015-08-17 DIAGNOSIS — M25612 Stiffness of left shoulder, not elsewhere classified: Secondary | ICD-10-CM | POA: Diagnosis not present

## 2015-08-17 DIAGNOSIS — M7502 Adhesive capsulitis of left shoulder: Secondary | ICD-10-CM | POA: Diagnosis not present

## 2015-08-17 DIAGNOSIS — Z9889 Other specified postprocedural states: Secondary | ICD-10-CM | POA: Diagnosis not present

## 2015-08-19 DIAGNOSIS — M25612 Stiffness of left shoulder, not elsewhere classified: Secondary | ICD-10-CM | POA: Diagnosis not present

## 2015-08-19 DIAGNOSIS — M7502 Adhesive capsulitis of left shoulder: Secondary | ICD-10-CM | POA: Diagnosis not present

## 2015-08-19 DIAGNOSIS — Z9889 Other specified postprocedural states: Secondary | ICD-10-CM | POA: Diagnosis not present

## 2015-08-20 DIAGNOSIS — Z9889 Other specified postprocedural states: Secondary | ICD-10-CM | POA: Diagnosis not present

## 2015-08-20 DIAGNOSIS — M25612 Stiffness of left shoulder, not elsewhere classified: Secondary | ICD-10-CM | POA: Diagnosis not present

## 2015-08-23 DIAGNOSIS — M7502 Adhesive capsulitis of left shoulder: Secondary | ICD-10-CM | POA: Diagnosis not present

## 2015-08-23 DIAGNOSIS — Z9889 Other specified postprocedural states: Secondary | ICD-10-CM | POA: Diagnosis not present

## 2015-08-23 DIAGNOSIS — M25612 Stiffness of left shoulder, not elsewhere classified: Secondary | ICD-10-CM | POA: Diagnosis not present

## 2015-08-25 DIAGNOSIS — M25612 Stiffness of left shoulder, not elsewhere classified: Secondary | ICD-10-CM | POA: Diagnosis not present

## 2015-08-25 DIAGNOSIS — M7502 Adhesive capsulitis of left shoulder: Secondary | ICD-10-CM | POA: Diagnosis not present

## 2015-08-25 DIAGNOSIS — Z9889 Other specified postprocedural states: Secondary | ICD-10-CM | POA: Diagnosis not present

## 2015-08-27 DIAGNOSIS — M7502 Adhesive capsulitis of left shoulder: Secondary | ICD-10-CM | POA: Diagnosis not present

## 2015-08-27 DIAGNOSIS — Z9889 Other specified postprocedural states: Secondary | ICD-10-CM | POA: Diagnosis not present

## 2015-08-27 DIAGNOSIS — M25612 Stiffness of left shoulder, not elsewhere classified: Secondary | ICD-10-CM | POA: Diagnosis not present

## 2015-08-31 DIAGNOSIS — M25512 Pain in left shoulder: Secondary | ICD-10-CM | POA: Diagnosis not present

## 2015-08-31 DIAGNOSIS — M25612 Stiffness of left shoulder, not elsewhere classified: Secondary | ICD-10-CM | POA: Diagnosis not present

## 2015-08-31 DIAGNOSIS — M7502 Adhesive capsulitis of left shoulder: Secondary | ICD-10-CM | POA: Diagnosis not present

## 2015-08-31 DIAGNOSIS — Z9889 Other specified postprocedural states: Secondary | ICD-10-CM | POA: Diagnosis not present

## 2015-08-31 DIAGNOSIS — M19012 Primary osteoarthritis, left shoulder: Secondary | ICD-10-CM | POA: Diagnosis not present

## 2015-08-31 DIAGNOSIS — G8929 Other chronic pain: Secondary | ICD-10-CM | POA: Diagnosis not present

## 2015-09-01 ENCOUNTER — Ambulatory Visit: Payer: Medicare Other | Admitting: Licensed Clinical Social Worker

## 2015-09-02 DIAGNOSIS — M7502 Adhesive capsulitis of left shoulder: Secondary | ICD-10-CM | POA: Diagnosis not present

## 2015-09-02 DIAGNOSIS — M25612 Stiffness of left shoulder, not elsewhere classified: Secondary | ICD-10-CM | POA: Diagnosis not present

## 2015-09-02 DIAGNOSIS — M19012 Primary osteoarthritis, left shoulder: Secondary | ICD-10-CM | POA: Diagnosis not present

## 2015-09-02 DIAGNOSIS — Z9889 Other specified postprocedural states: Secondary | ICD-10-CM | POA: Diagnosis not present

## 2015-09-03 DIAGNOSIS — M25612 Stiffness of left shoulder, not elsewhere classified: Secondary | ICD-10-CM | POA: Diagnosis not present

## 2015-09-03 DIAGNOSIS — Z9889 Other specified postprocedural states: Secondary | ICD-10-CM | POA: Diagnosis not present

## 2015-09-03 DIAGNOSIS — M19012 Primary osteoarthritis, left shoulder: Secondary | ICD-10-CM | POA: Diagnosis not present

## 2015-09-03 DIAGNOSIS — M7502 Adhesive capsulitis of left shoulder: Secondary | ICD-10-CM | POA: Diagnosis not present

## 2015-09-07 DIAGNOSIS — Z9889 Other specified postprocedural states: Secondary | ICD-10-CM | POA: Diagnosis not present

## 2015-09-07 DIAGNOSIS — M19012 Primary osteoarthritis, left shoulder: Secondary | ICD-10-CM | POA: Diagnosis not present

## 2015-09-07 DIAGNOSIS — M25612 Stiffness of left shoulder, not elsewhere classified: Secondary | ICD-10-CM | POA: Diagnosis not present

## 2015-09-08 ENCOUNTER — Encounter: Payer: Self-pay | Admitting: Nurse Practitioner

## 2015-09-08 ENCOUNTER — Ambulatory Visit (INDEPENDENT_AMBULATORY_CARE_PROVIDER_SITE_OTHER): Payer: Medicare Other | Admitting: Nurse Practitioner

## 2015-09-08 VITALS — BP 150/82 | HR 65 | Temp 98.1°F | Ht 63.0 in | Wt 169.8 lb

## 2015-09-08 DIAGNOSIS — J019 Acute sinusitis, unspecified: Secondary | ICD-10-CM | POA: Insufficient documentation

## 2015-09-08 DIAGNOSIS — J0140 Acute pansinusitis, unspecified: Secondary | ICD-10-CM

## 2015-09-08 MED ORDER — PREDNISONE 10 MG PO TABS: ORAL_TABLET | ORAL | Status: DC

## 2015-09-08 MED FILL — predniSONE 10 MG TABS: 10 | 6 days supply | Qty: 21 | Fill #0

## 2015-09-08 NOTE — Progress Notes (Signed)
Patient ID: [name], female    DOB: 02-Oct-1945  Age: 70 y.o. MRN: [id number]  CC: Nasal Congestion; Cough; and Ear Fullness   HPI [name] presents for CC of nasal congestion, cough, and ear fullness x 1.25 months.   1) Pt reports persistent since Jan. Rhinorrhea- clear, wheezing, non-productive cough Ear pressure L>R ear  Maxillary and frontal pressure, L> R side slightly more   Treatment to date: Tylenol 500 mg two tablets every 6 hrs Amoxicillin from dentist  Neti-pot   Sick contacts: Denies   History Nile has a past medical history of Hypertension; Allergy; GERD (gastroesophageal reflux disease); Hyperlipidemia; Osteopenia; colonic polyps; Diverticulosis of colon; Interstitial cystitis; Vitamin D deficiency; Xerostomia; Arthritis; Dry eye syndrome; Osteoarthritis; Cystitis; Incontinence; Ankylosing spondylitis ([place]); HLA B27 (HLA B27 positive); Frozen shoulder; Bell palsy; Anxiety; Osteopenia; Subjective visual disturbance of both eyes (12/30/2013); Menopause; Sleep apnea; CTS (carpal tunnel syndrome); Hypothyroidism; and Kidney stone.   She has past surgical history that includes Total abdominal hysterectomy w/ bilateral salpingoophorectomy; Bladder surgery; G 1 P 1; colonscopy; interstitial cystitis; elevated LFTs; Colonoscopy w/ polypectomy; Finger surgery (Left, 2004); Cystoscopy with retrograde pyelogram, ureteroscopy and stent placement (Left, 09/01/2013); Abdominal hysterectomy; and Carpal tunnel release (Left, 12/22/2014).   Her family history includes Asthma in her mother; Cancer in her maternal aunt, paternal aunt, and sister; Cirrhosis in her father; Colon cancer (age of onset: 21) in her sister; Coronary artery disease in her maternal grandfather and mother; Dementia in her mother; Diabetes in her father, paternal aunt, and paternal grandmother; Hypertension in her mother; Osteoporosis in her mother; Stroke in her mother.She reports that she has never smoked. She has  never used smokeless tobacco. She reports that she drinks alcohol. She reports that she does not use illicit drugs.  Outpatient Prescriptions Prior to Visit  Medication Sig Dispense Refill  . aspirin 81 MG tablet Take 81 mg by mouth daily.      Marland Kitchen BIOTIN PO Take 2,500 mg by mouth daily.    . cetirizine (ZYRTEC) 10 MG tablet Take 10 mg by mouth daily.      . Cholecalciferol (VITAMIN D-3) 5000 UNITS TABS Take 5,000 Units by mouth 2 (two) times daily.     . hyoscyamine (LEVSIN) 0.125 MG/5ML ELIX Take 0.125 mg by mouth every 4 (four) hours as needed. Pt taking generic form: Oscimin SL tabs 0.125 mg    . metoprolol succinate (TOPROL-XL) 50 MG 24 hr tablet TAKE ONE-HALF (1/2) TABLET DAILY 45 tablet 2  . Misc Natural Products (COSAMIN ASU ADVANCED FORMULA PO) Take 2 each by mouth daily.    . Naproxen Sodium (ALEVE) 220 MG CAPS Take 1 each by mouth as needed.    . ondansetron (ZOFRAN-ODT) 8 MG disintegrating tablet Take 1 tablet (8 mg total) by mouth every 12 (twelve) hours. 20 tablet 0  . Polyethyl Glycol-Propyl Glycol (SYSTANE OP) Apply 1 drop to eye as needed. Apply to both eyes as needed    . vitamin E 400 UNIT capsule Take 400 Units by mouth daily.    Marland Kitchen levothyroxine (SYNTHROID, LEVOTHROID) 25 MCG tablet TAKE 1 TABLET DAILY BEFORE BREAKFAST 90 tablet 1   No facility-administered medications prior to visit.    ROS Review of Systems  Constitutional: Negative for fever, chills, diaphoresis and fatigue.  HENT: Positive for congestion, ear pain, postnasal drip, rhinorrhea, sinus pressure and sneezing. Negative for ear discharge and sore throat.   Eyes: Negative for visual disturbance.  Respiratory: Positive for cough. Negative  for chest tightness, shortness of breath and wheezing.   Cardiovascular: Negative for chest pain, palpitations and leg swelling.  Gastrointestinal: Negative for nausea, vomiting, diarrhea and rectal pain.  Skin: Negative for rash.  Neurological: Positive for headaches.  Negative for dizziness.    Objective:  BP 150/82 mmHg  Pulse 65  Temp(Src) 98.1 F (36.7 C) (Oral)  Ht 5' 3"  (1.6 m)  Wt 169 lb 12 oz (76.998 kg)  BMI 30.08 kg/m2  SpO2 98%  Physical Exam  Constitutional: She is oriented to person, place, and time. She appears well-developed and well-nourished. No distress.  HENT:  Head: Normocephalic and atraumatic.  Right Ear: External ear normal.  Left Ear: External ear normal.  Mouth/Throat: Oropharynx is clear and moist. No oropharyngeal exudate.  TM's bulging slightly, but clear   Eyes: EOM are normal. Pupils are equal, round, and reactive to light. Right eye exhibits no discharge. Left eye exhibits no discharge. No scleral icterus.  Neck: Normal range of motion. Neck supple.  Cardiovascular: Normal rate, regular rhythm and normal heart sounds.  Exam reveals no gallop and no friction rub.   No murmur heard. Pulmonary/Chest: Effort normal and breath sounds normal. No respiratory distress. She has no wheezes. She has no rales. She exhibits no tenderness.  Lymphadenopathy:    She has no cervical adenopathy.  Neurological: She is alert and oriented to person, place, and time.  Skin: Skin is warm and dry. No rash noted. She is not diaphoretic.  Psychiatric: She has a normal mood and affect. Her behavior is normal. Judgment and thought content normal.   Assessment & Plan:   There are no diagnoses linked to this encounter. I have discontinued Ms. Tadesse's levothyroxine. I am also having her start on predniSONE. Additionally, I am having her maintain her aspirin, cetirizine, Vitamin D-3, vitamin E, BIOTIN PO, hyoscyamine, Polyethyl Glycol-Propyl Glycol (SYSTANE OP), ondansetron, metoprolol succinate, Misc Natural Products (COSAMIN ASU ADVANCED FORMULA PO), Naproxen Sodium, OSCIMIN, and ibuprofen.  Meds ordered this encounter  Medications  . OSCIMIN 0.125 MG SL tablet    Sig:   . ibuprofen (ADVIL,MOTRIN) 800 MG tablet    Sig: Take 800 mg by  mouth.  . predniSONE (DELTASONE) 10 MG tablet    Sig: Take 6 tablets by mouth on day 1 then decrease by 1 tablet each day until gone.    Dispense:  21 tablet    Refill:  0    Order Specific Question:  Supervising Provider    Answer:  Crecencio Mc [[id number]]     Follow-up: Return if symptoms worsen or fail to improve.

## 2015-09-08 NOTE — Assessment & Plan Note (Signed)
New onset No sign of bacterial infection Prednisone taper to decrease inflammation given  Instructions for taking done verbally and on AVS Delsym or Robitussin plain and benadryl at night encouraged  FU prn worsening/failure to improve.

## 2015-09-08 NOTE — Patient Instructions (Addendum)
Prednisone with breakfast or lunch at the latest.  6 tablets on day 1, 5 tablets on day 2, 4 tablets on day 3, 3 tablets on day 4, 2 tablets day 5, 1 tablet on day 6...done! Take tablets all together not spaced out Don't take with NSAIDs (Ibuprofen, Aleve, Naproxen, Meloxicam ect...)  Follow up with your new doctor if not helpful in 7 days.

## 2015-09-09 ENCOUNTER — Ambulatory Visit (INDEPENDENT_AMBULATORY_CARE_PROVIDER_SITE_OTHER): Payer: Medicare Other | Admitting: Licensed Clinical Social Worker

## 2015-09-09 DIAGNOSIS — Z9889 Other specified postprocedural states: Secondary | ICD-10-CM | POA: Diagnosis not present

## 2015-09-09 DIAGNOSIS — F330 Major depressive disorder, recurrent, mild: Secondary | ICD-10-CM | POA: Diagnosis not present

## 2015-09-09 DIAGNOSIS — M25612 Stiffness of left shoulder, not elsewhere classified: Secondary | ICD-10-CM | POA: Diagnosis not present

## 2015-09-09 DIAGNOSIS — M19012 Primary osteoarthritis, left shoulder: Secondary | ICD-10-CM | POA: Diagnosis not present

## 2015-09-15 DIAGNOSIS — M19012 Primary osteoarthritis, left shoulder: Secondary | ICD-10-CM | POA: Diagnosis not present

## 2015-09-15 DIAGNOSIS — Z9889 Other specified postprocedural states: Secondary | ICD-10-CM | POA: Diagnosis not present

## 2015-09-15 DIAGNOSIS — M25612 Stiffness of left shoulder, not elsewhere classified: Secondary | ICD-10-CM | POA: Diagnosis not present

## 2015-09-16 ENCOUNTER — Ambulatory Visit (INDEPENDENT_AMBULATORY_CARE_PROVIDER_SITE_OTHER): Payer: Medicare Other | Admitting: Licensed Clinical Social Worker

## 2015-09-16 DIAGNOSIS — F330 Major depressive disorder, recurrent, mild: Secondary | ICD-10-CM

## 2015-09-17 ENCOUNTER — Ambulatory Visit (INDEPENDENT_AMBULATORY_CARE_PROVIDER_SITE_OTHER): Payer: Medicare Other | Admitting: Nurse Practitioner

## 2015-09-17 ENCOUNTER — Encounter: Payer: Self-pay | Admitting: Nurse Practitioner

## 2015-09-17 VITALS — BP 182/102 | HR 62 | Temp 98.0°F | Resp 16 | Wt 169.0 lb

## 2015-09-17 DIAGNOSIS — I10 Essential (primary) hypertension: Secondary | ICD-10-CM | POA: Diagnosis not present

## 2015-09-17 MED ORDER — AMLODIPINE BESYLATE 2.5 MG PO TABS: 2.5000 mg | ORAL_TABLET | Freq: Every day | ORAL | Status: DC

## 2015-09-17 MED ORDER — METOPROLOL SUCCINATE ER 50 MG PO TB24: 50.0000 mg | ORAL_TABLET | Freq: Every day | ORAL | Status: DC

## 2015-09-17 MED FILL — AMLODIPINE BESYLATE 2.5 MG: 2.5 | 30 days supply | Qty: 30 | Fill #0

## 2015-09-17 MED FILL — METOPROLOL SUCC ER 50 MG TA: 50 | 30 days supply | Qty: 30 | Fill #0

## 2015-09-17 NOTE — Progress Notes (Signed)
Pre visit review using our clinic review tool, if applicable. No additional management support is needed unless otherwise documented below in the visit note. 

## 2015-09-17 NOTE — Patient Instructions (Addendum)
Things to write down: Blood pressures while on Metoprolol XL 50 mg (whole tablet) Add the amlodipine 2.5 mg 1 tablet after 3 days on Metoprolol XL if your blood pressure is above or at 140/90.  Pay attention to vision, headaches, feet swelling  Follow up with me next week

## 2015-09-17 NOTE — Assessment & Plan Note (Signed)
BP Readings from Last 3 Encounters:  09/17/15 182/102  09/08/15 150/82  07/06/15 142/72   Increasing Advised to write down BP Called in brand metoprolol XL 50 mg (was on 25 mg) Try this for a few days then add Amlodipine 2.5 mg 1 tablet daily for BP > or = to 140/90. FU next week

## 2015-09-17 NOTE — Progress Notes (Signed)
Patient ID: [name], female    DOB: 1946/05/02  Age: 69 y.o. MRN: [id number]  CC: Hypertension   HPI [name] presents for CC of HA, BP elevated, shoulder pain x 10 days.   1) HA and blurred vision (see this in her history) Eye exam recently  Saw Dr. Jannifer [name] in Dec.  Constant shoulder pain after a procedure in Nov. 2016 (left shoulder arthroscopy done by Dr. Ronnie [name]) completed PT  Shoulder pain- achy and constant   BP medication- Toprol-XL and allergic to Lisinopril    Pt reports she has pain meds- but it knocks her out and she has to help her husband  History [name] has a past medical history of Hypertension; Allergy; GERD (gastroesophageal reflux disease); Hyperlipidemia; Osteopenia; colonic polyps; Diverticulosis of colon; Interstitial cystitis; Vitamin D deficiency; Xerostomia; Arthritis; Dry eye syndrome; Osteoarthritis; Cystitis; Incontinence; Ankylosing spondylitis ([place]); HLA B27 (HLA B27 positive); Frozen shoulder; Bell palsy; Anxiety; Osteopenia; Subjective visual disturbance of both eyes (12/30/2013); Menopause; Sleep apnea; CTS (carpal tunnel syndrome); Hypothyroidism; and Kidney stone.   She has past surgical history that includes Total abdominal hysterectomy w/ bilateral salpingoophorectomy; Bladder surgery; G 1 P 1; colonscopy; interstitial cystitis; elevated LFTs; Colonoscopy w/ polypectomy; Finger surgery (Left, 2004); Cystoscopy with retrograde pyelogram, ureteroscopy and stent placement (Left, 09/01/2013); Abdominal hysterectomy; Carpal tunnel release (Left, 12/22/2014); and Shoulder arthroscopy (Left).   Her family history includes Asthma in her mother; Cancer in her maternal aunt, paternal aunt, and sister; Cirrhosis in her father; Colon cancer (age of onset: 75) in her sister; Coronary artery disease in her maternal grandfather and mother; Dementia in her mother; Diabetes in her father, paternal aunt, and paternal grandmother; Hypertension in her mother; Osteoporosis in  her mother; Stroke in her mother.She reports that she has never smoked. She has never used smokeless tobacco. She reports that she drinks alcohol. She reports that she does not use illicit drugs.  Outpatient Prescriptions Prior to Visit  Medication Sig Dispense Refill  . aspirin 81 MG tablet Take 81 mg by mouth daily.      . cetirizine (ZYRTEC) 10 MG tablet Take 10 mg by mouth daily.      . Cholecalciferol (VITAMIN D-3) 5000 UNITS TABS Take 5,000 Units by mouth 2 (two) times daily.     Marland Kitchen ibuprofen (ADVIL,MOTRIN) 800 MG tablet Take 800 mg by mouth.    . Misc Natural Products (COSAMIN ASU ADVANCED FORMULA PO) Take 2 each by mouth daily.    . Naproxen Sodium (ALEVE) 220 MG CAPS Take 1 each by mouth as needed.    . ondansetron (ZOFRAN-ODT) 8 MG disintegrating tablet Take 1 tablet (8 mg total) by mouth every 12 (twelve) hours. 20 tablet 0  . OSCIMIN 0.125 MG SL tablet     . Polyethyl Glycol-Propyl Glycol (SYSTANE OP) Apply 1 drop to eye as needed. Apply to both eyes as needed    . vitamin E 400 UNIT capsule Take 400 Units by mouth daily.    . metoprolol succinate (TOPROL-XL) 50 MG 24 hr tablet TAKE ONE-HALF (1/2) TABLET DAILY 45 tablet 2  . BIOTIN PO Take 2,500 mg by mouth daily.    . hyoscyamine (LEVSIN) 0.125 MG/5ML ELIX Take 0.125 mg by mouth every 4 (four) hours as needed. Pt taking generic form: Oscimin SL tabs 0.125 mg    . predniSONE (DELTASONE) 10 MG tablet Take 6 tablets by mouth on day 1 then decrease by 1 tablet each day until gone. 21 tablet 0  No facility-administered medications prior to visit.    ROS Review of Systems  Constitutional: Negative for fever, chills, diaphoresis and fatigue.  Eyes: Positive for visual disturbance. Negative for photophobia, pain, discharge, redness and itching.  Respiratory: Negative for chest tightness, shortness of breath and wheezing.   Cardiovascular: Negative for chest pain, palpitations and leg swelling.  Gastrointestinal: Negative for nausea,  vomiting and diarrhea.  Musculoskeletal: Positive for arthralgias.  Skin: Negative for rash.  Neurological: Positive for headaches. Negative for dizziness, facial asymmetry and light-headedness.    Objective:  BP 182/102 mmHg  Pulse 62  Temp(Src) 98 F (36.7 C) (Oral)  Resp 16  Wt 169 lb (76.658 kg)  SpO2 97%  Physical Exam  Constitutional: She is oriented to person, place, and time. She appears well-developed and well-nourished. No distress.  HENT:  Head: Normocephalic and atraumatic.  Right Ear: External ear normal.  Left Ear: External ear normal.  Eyes: Conjunctivae and EOM are normal. Pupils are equal, round, and reactive to light. Right eye exhibits no discharge. Left eye exhibits no discharge. No scleral icterus.  Cardiovascular: Normal rate, regular rhythm and normal heart sounds.  Exam reveals no gallop and no friction rub.   No murmur heard. Pulmonary/Chest: Effort normal and breath sounds normal. No respiratory distress. She has no wheezes. She has no rales. She exhibits no tenderness.  Neurological: She is alert and oriented to person, place, and time.  Skin: Skin is warm and dry. No rash noted. She is not diaphoretic.  Psychiatric: She has a normal mood and affect. Her behavior is normal. Judgment and thought content normal.   Assessment & Plan:   There are no diagnoses linked to this encounter. I have discontinued [name]'s BIOTIN PO, hyoscyamine, metoprolol succinate, and predniSONE. I am also having her start on metoprolol succinate and amLODipine. Additionally, I am having her maintain her aspirin, cetirizine, Vitamin D-3, vitamin E, Polyethyl Glycol-Propyl Glycol (SYSTANE OP), ondansetron, Misc Natural Products (COSAMIN ASU ADVANCED FORMULA PO), Naproxen Sodium, OSCIMIN, and ibuprofen.  Meds ordered this encounter  Medications  . metoprolol succinate (TOPROL-XL) 50 MG 24 hr tablet    Sig: Take 1 tablet (50 mg total) by mouth daily. Take with or immediately  following a meal.    Dispense:  30 tablet    Refill:  0    BRAND NAME per pt    Order Specific Question:  Supervising Provider    Answer:  [name] [[id number]]  . amLODipine (NORVASC) 2.5 MG tablet    Sig: Take 1 tablet (2.5 mg total) by mouth daily.    Dispense:  30 tablet    Refill:  0    Order Specific Question:  Supervising Provider    Answer:  Crecencio Mc [[id number]]     Follow-up: Return in about 1 week (around 09/24/2015) for Follow up next week for HTN (wed or Friday).

## 2015-09-22 DIAGNOSIS — Z9889 Other specified postprocedural states: Secondary | ICD-10-CM | POA: Diagnosis not present

## 2015-09-22 DIAGNOSIS — M25612 Stiffness of left shoulder, not elsewhere classified: Secondary | ICD-10-CM | POA: Diagnosis not present

## 2015-09-22 DIAGNOSIS — M19012 Primary osteoarthritis, left shoulder: Secondary | ICD-10-CM | POA: Diagnosis not present

## 2015-09-27 ENCOUNTER — Ambulatory Visit (INDEPENDENT_AMBULATORY_CARE_PROVIDER_SITE_OTHER): Payer: Medicare Other | Admitting: Licensed Clinical Social Worker

## 2015-09-27 DIAGNOSIS — F330 Major depressive disorder, recurrent, mild: Secondary | ICD-10-CM | POA: Diagnosis not present

## 2015-09-29 ENCOUNTER — Encounter: Payer: Self-pay | Admitting: Internal Medicine

## 2015-09-29 ENCOUNTER — Ambulatory Visit (INDEPENDENT_AMBULATORY_CARE_PROVIDER_SITE_OTHER): Payer: Medicare Other | Admitting: Internal Medicine

## 2015-09-29 ENCOUNTER — Other Ambulatory Visit (INDEPENDENT_AMBULATORY_CARE_PROVIDER_SITE_OTHER): Payer: Medicare Other

## 2015-09-29 VITALS — BP 144/90 | HR 63 | Temp 98.3°F | Resp 16 | Wt 167.0 lb

## 2015-09-29 DIAGNOSIS — R002 Palpitations: Secondary | ICD-10-CM

## 2015-09-29 DIAGNOSIS — I10 Essential (primary) hypertension: Secondary | ICD-10-CM | POA: Diagnosis not present

## 2015-09-29 DIAGNOSIS — G4733 Obstructive sleep apnea (adult) (pediatric): Secondary | ICD-10-CM

## 2015-09-29 DIAGNOSIS — R252 Cramp and spasm: Secondary | ICD-10-CM

## 2015-09-29 DIAGNOSIS — R739 Hyperglycemia, unspecified: Secondary | ICD-10-CM | POA: Diagnosis not present

## 2015-09-29 DIAGNOSIS — L989 Disorder of the skin and subcutaneous tissue, unspecified: Secondary | ICD-10-CM

## 2015-09-29 DIAGNOSIS — M19012 Primary osteoarthritis, left shoulder: Secondary | ICD-10-CM | POA: Diagnosis not present

## 2015-09-29 DIAGNOSIS — Z9889 Other specified postprocedural states: Secondary | ICD-10-CM | POA: Diagnosis not present

## 2015-09-29 DIAGNOSIS — R7303 Prediabetes: Secondary | ICD-10-CM | POA: Insufficient documentation

## 2015-09-29 DIAGNOSIS — R0789 Other chest pain: Secondary | ICD-10-CM

## 2015-09-29 DIAGNOSIS — M25612 Stiffness of left shoulder, not elsewhere classified: Secondary | ICD-10-CM | POA: Diagnosis not present

## 2015-09-29 LAB — CBC WITH DIFFERENTIAL/PLATELET
Basophils Absolute: 0 K/uL (ref 0.0–0.1)
Basophils Relative: 0.3 % (ref 0.0–3.0)
Eosinophils Absolute: 0.2 K/uL (ref 0.0–0.7)
Eosinophils Relative: 3.1 % (ref 0.0–5.0)
HCT: 38.5 % (ref 36.0–46.0)
Hemoglobin: 12.9 g/dL (ref 12.0–15.0)
Lymphocytes Relative: 25.6 % (ref 12.0–46.0)
Lymphs Abs: 1.6 K/uL (ref 0.7–4.0)
MCHC: 33.4 g/dL (ref 30.0–36.0)
MCV: 86.5 fl (ref 78.0–100.0)
Monocytes Absolute: 0.5 K/uL (ref 0.1–1.0)
Monocytes Relative: 7.3 % (ref 3.0–12.0)
Neutro Abs: 4.1 K/uL (ref 1.4–7.7)
Neutrophils Relative %: 63.7 % (ref 43.0–77.0)
Platelets: 270 K/uL (ref 150.0–400.0)
RBC: 4.46 Mil/uL (ref 3.87–5.11)
RDW: 14.2 % (ref 11.5–15.5)
WBC: 6.4 K/uL (ref 4.0–10.5)

## 2015-09-29 LAB — COMPREHENSIVE METABOLIC PANEL
ALBUMIN: 4.6 g/dL (ref 3.5–5.2)
ALK PHOS: 84 U/L (ref 39–117)
ALT: 23 U/L (ref 0–35)
AST: 21 U/L (ref 0–37)
BUN: 17 mg/dL (ref 6–23)
CHLORIDE: 105 meq/L (ref 96–112)
CO2: 27 mEq/L (ref 19–32)
Calcium: 10 mg/dL (ref 8.4–10.5)
Creatinine, Ser: 1.07 mg/dL (ref 0.40–1.20)
GFR: 53.63 mL/min — AB (ref >60.00)
Glucose, Bld: 103 mg/dL — ABNORMAL HIGH (ref 70–99)
POTASSIUM: 4.1 meq/L (ref 3.5–5.1)
SODIUM: 140 meq/L (ref 135–145)
TOTAL PROTEIN: 7.8 g/dL (ref 6.0–8.3)
Total Bilirubin: 0.5 mg/dL (ref 0.2–1.2)

## 2015-09-29 LAB — HEMOGLOBIN A1C: Hgb A1c MFr Bld: 6.2 % (ref 4.6–6.5)

## 2015-09-29 LAB — MAGNESIUM: Magnesium: 2 mg/dL (ref 1.5–2.5)

## 2015-09-29 MED ORDER — METOPROLOL SUCCINATE ER 50 MG PO TB24: 50.0000 mg | ORAL_TABLET | Freq: Every day | ORAL | Status: DC

## 2015-09-29 NOTE — Assessment & Plan Note (Signed)
EKG is stable She had a nuclear stress test 2014, which was normal Her chest pain is reproducible No further evaluation at this time She has an appointment later this month for follow-up and we will reevaluate at that time She will pay more attention to the chest pain and see if it's related to her shoulder pain, which is chronic and there is any relation to activity

## 2015-09-29 NOTE — Patient Instructions (Signed)
  We have reviewed your prior records including labs and tests today.  Test(s) ordered today. Your results will be released to [place] (or called to you) after review, usually within 72hours after test completion. If any changes need to be made, you will be notified at that same time.   Medications reviewed and updated.  No changes recommended at this time.  Your prescription(s) have been submitted to your pharmacy. Please take as directed and contact our office if you believe you are having problem(s) with the medication(s).

## 2015-09-29 NOTE — Progress Notes (Signed)
Pre visit review using our clinic review tool, if applicable. No additional management support is needed unless otherwise documented below in the visit note. 

## 2015-09-29 NOTE — Assessment & Plan Note (Signed)
Blood pressure much better controlled at home since increasing metoprolol to 50 mg daily. Never took the amlodipine and since her blood pressure is well controlled at home we will discontinue that Continue metoprolol 50 mg daily only Continue to monitor at home Blood pressure slightly elevated here today She will follow-up later this month to recheck it Check blood work, CMP

## 2015-09-29 NOTE — Assessment & Plan Note (Signed)
Increasing-in legs She does not drink many fluids throughout the day and advised increasing her fluid intake She is currently not exercising regularly and advised increasing her activity may help Trial of magnesium supplementation Check blood work today She will follow-up at the end of the month

## 2015-09-29 NOTE — Progress Notes (Signed)
Subjective:    Patient ID: [name], female    DOB: 04-Mar-1946, 70 y.o.   MRN: [id number]  HPI She is here for for elevated blood pressure.    Hypertension: She is taking her medication daily. She is compliant with a low sodium diet. She feels a flutter in her throat not her chest about once a day.  She has chest pain on the left side of her chest that comes a couple of times a day.  She denies any relation to activity.  She has chronic left shoulder pain and is following with orthopedics. She denies edema, shortness of breath and regular headaches. She does monitor her blood pressure at home 130-180/80-100's.  She saw Lorane Gell and her metoprolol was increased from 25 mg daily to 50 mg daily and her BP has been better.  Her BP at home over the past week or so was 116/74, 124/74, 118/77. She was prescribed amlodipine and has never taken it.  She has been less active recently with her chronic shoulder pain. She denies any other changes in lifestyle.  Leg cramps:  She gets leg cramps in her legs almost daily. They can wake her up.  She does not drink a lot of water during the day.  She is less active now due to chronic left shoulder pain.  She denies any relation to medications or supplements.  She has a red mark on her left arm for months.  It itches.  She has tried lotion, neosporin and it has not helped.  She has never tried a steroid cream.  Medications and allergies reviewed with patient and updated if appropriate.  Patient Active Problem List   Diagnosis Date Noted  . Acute sinusitis 09/08/2015  . History of arthroscopy of shoulder 07/06/2015  . Pain in shoulder 02/16/2015  . Impingement syndrome of left shoulder region 09/22/2014  . Interstitial cystitis 07/09/2014  . Elevated LFTs 07/09/2014  . Elevated TSH 07/09/2014  . Diverticulosis of colon without hemorrhage 07/08/2014  . S/P total hysterectomy and bilateral salpingo-oophorectomy 04/12/2014  . Ureteral calculus, right  04/12/2014  . Carpal tunnel syndrome 04/12/2014  . Food allergy 03/06/2014  . Renal calculi 01/30/2014  . OSA (obstructive sleep apnea) 01/14/2014  . Subjective visual disturbance of both eyes 12/30/2013  . Visual changes 12/17/2013  . Elevated serum creatinine 12/16/2013  . Other abnormal glucose 12/16/2013  . Seasonal and perennial allergic rhinitis 12/06/2013  . Seasonal allergic conjunctivitis 12/06/2013  . Pneumonia, primary atypical 05/15/2013  . Chest pain 01/03/2013  . History of Bell's palsy 02/26/2012  . Vitamin D deficiency 06/27/2010  . 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  . DRY MOUTH 06/27/2010  . LOW BACK PAIN, CHRONIC 08/16/2009  . ABDOMINAL PAIN, SUPRAPUBIC 05/25/2009  . GERD 04/30/2009  . DIVERTICULOSIS, COLON 04/30/2009  . OSTEOPENIA 04/30/2009  . History of colonic polyps 04/30/2009  . COSTOCHONDRITIS 01/15/2008  . HYPERLIPIDEMIA 08/21/2007  . Essential hypertension 08/21/2007  . ARTHRALGIA 08/21/2007  . BACK PAIN WITH RADICULOPATHY 01/25/2007    Current Outpatient Prescriptions on File Prior to Visit  Medication Sig Dispense Refill  . aspirin 81 MG tablet Take 81 mg by mouth daily.      . cetirizine (ZYRTEC) 10 MG tablet Take 10 mg by mouth daily.      . Cholecalciferol (VITAMIN D-3) 5000 UNITS TABS Take 5,000 Units by mouth 2 (two) times daily.     Marland Kitchen ibuprofen (ADVIL,MOTRIN) 800 MG tablet Take 800 mg by mouth.    Marland Kitchen  metoprolol succinate (TOPROL-XL) 50 MG 24 hr tablet Take 1 tablet (50 mg total) by mouth daily. Take with or immediately following a meal. 30 tablet 0  . Misc Natural Products (COSAMIN ASU ADVANCED FORMULA PO) Take 2 each by mouth daily.    . Naproxen Sodium (ALEVE) 220 MG CAPS Take 1 each by mouth as needed.    . ondansetron (ZOFRAN-ODT) 8 MG disintegrating tablet Take 1 tablet (8 mg total) by mouth every 12 (twelve) hours. 20 tablet 0  . OSCIMIN 0.125 MG SL tablet     . Polyethyl Glycol-Propyl Glycol (SYSTANE OP) Apply 1 drop to eye as needed. Apply  to both eyes as needed    . vitamin E 400 UNIT capsule Take 400 Units by mouth daily.    Marland Kitchen amLODipine (NORVASC) 2.5 MG tablet Take 1 tablet (2.5 mg total) by mouth daily. (Patient not taking: Reported on 09/29/2015) 30 tablet 0   No current facility-administered medications on file prior to visit.    Past Medical History  Diagnosis Date  . Hypertension   . Allergy     seasonal  . GERD (gastroesophageal reflux disease)   . Hyperlipidemia     NMR 2005  . Osteopenia     [facility]  . Hx of colonic polyps   . Diverticulosis of colon   . Interstitial cystitis   . Vitamin D deficiency   . Xerostomia     and Xeroophthalmia  . Arthritis   . Dry eye syndrome   . Osteoarthritis   . Cystitis   . Incontinence   . Ankylosing spondylitis ([place])   . HLA B27 (HLA B27 positive)   . Frozen shoulder   . Bell palsy   . Anxiety   . Osteopenia   . Subjective visual disturbance of both eyes 12/30/2013  . Menopause   . Sleep apnea   . CTS (carpal tunnel syndrome)   . Hypothyroidism   . Kidney stone     Past Surgical History  Procedure Laterality Date  . Total abdominal hysterectomy w/ bilateral salpingoophorectomy      dysfunctional menses  . Bladder surgery      for incontinence  . G 1 p 1    . Colonscopy      tics 10-2002  . Interstitial cystitis      with clot in catheter  . Elevated lfts      due to Elmiron  . Colonoscopy w/ polypectomy      adenomatous poyp 04-2008  . Finger surgery Left 2004    2nd finger  . Cystoscopy with retrograde pyelogram, ureteroscopy and stent placement Left 09/01/2013    Procedure: CYSTOSCOPY WITH RETROGRADE PYELOGRAM, AND LEFT STENT PLACEMENT;  Surgeon: Molli Hazard, MD;  Location: [facility];  Service: Urology;  Laterality: Left;  . Abdominal hysterectomy    . Carpal tunnel release Left 12/22/2014    Procedure: LEFT CARPAL TUNNEL RELEASE;  Surgeon: Daryll Brod, MD;  Location: [facility];  Service: Orthopedics;   Laterality: Left;  . Shoulder arthroscopy Left     Social History   Social History  . Marital Status: Married    Spouse Name: N/A  . Number of Children: 1  . Years of Education: BS   Occupational History  . retired   . teacher     Kindergarten   Social History Main Topics  . Smoking status: Never Smoker   . Smokeless tobacco: Never Used  . Alcohol Use:  Yes     Comment: 1 glass of wine a years  . Drug Use: No  . Sexual Activity: Not Currently   Other Topics Concern  . Not on file   Social History Narrative   Married          Family History  Problem Relation Age of Onset  . Coronary artery disease Mother   . Hypertension Mother   . Asthma Mother   . Osteoporosis Mother   . Dementia Mother   . Stroke Mother     mini cva  . Diabetes Father   . Cirrhosis Father     non alcoholic  . Cancer Sister     colon  . Colon cancer Sister 50  . Cancer Paternal Aunt     2 aunts had renal cancer  . Coronary artery disease Maternal Grandfather   . Cancer Maternal Aunt     breast  . Diabetes Paternal Aunt   . Diabetes Paternal Grandmother     Review of Systems  Constitutional: Negative for fever.  Respiratory: Positive for shortness of breath (allergy related). Negative for cough and wheezing.   Cardiovascular: Positive for chest pain, palpitations and leg swelling (occasional ankles).  Musculoskeletal:       Muscle cramps  Skin: Negative for rash.       One circular area of redness left posterior arm that is itchy  Neurological: Positive for light-headedness. Negative for dizziness and headaches.       Objective:   Filed Vitals:   09/29/15 0934  BP: 144/90  Pulse: 63  Temp: 98.3 F (36.8 C)  Resp: 16   Filed Weights   09/29/15 0934  Weight: 167 lb (75.751 kg)   Body mass index is 29.59 kg/(m^2).   Physical Exam Constitutional: Appears well-developed and well-nourished. No distress.  Neck: Neck supple. No tracheal deviation present. No thyromegaly  present.  No carotid bruit. No cervical adenopathy.   Cardiovascular: Normal rate, regular rhythm and normal heart sounds.   No murmur heard.  No edema Chest wall: reproducible pain and left-sided chest:   Pulmonary/Chest: Effort normal and breath sounds normal. No respiratory distress. No wheezes.  Skin: single regular and posterior left upper arm is slightly raised, no other concerning skin lesions         Assessment & Plan:   Itchy skin lesion-advised that she can try over-the-counter steroid cream  See Problem List for Assessment and Plan of chronic medical problems.   Follow-up later this month to recheck blood pressure and review blood work

## 2015-10-05 ENCOUNTER — Other Ambulatory Visit: Payer: Self-pay | Admitting: Orthopedic Surgery

## 2015-10-05 DIAGNOSIS — M25512 Pain in left shoulder: Secondary | ICD-10-CM

## 2015-10-05 DIAGNOSIS — R531 Weakness: Secondary | ICD-10-CM

## 2015-10-05 DIAGNOSIS — M24812 Other specific joint derangements of left shoulder, not elsewhere classified: Secondary | ICD-10-CM

## 2015-10-06 DIAGNOSIS — M19012 Primary osteoarthritis, left shoulder: Secondary | ICD-10-CM | POA: Diagnosis not present

## 2015-10-06 DIAGNOSIS — Z9889 Other specified postprocedural states: Secondary | ICD-10-CM | POA: Diagnosis not present

## 2015-10-06 DIAGNOSIS — M25612 Stiffness of left shoulder, not elsewhere classified: Secondary | ICD-10-CM | POA: Diagnosis not present

## 2015-10-06 MED FILL — diazePAM 10 MG TABS: 10 | 1 days supply | Qty: 1 | Fill #0

## 2015-10-07 ENCOUNTER — Ambulatory Visit
Admission: RE | Admit: 2015-10-07 | Discharge: 2015-10-07 | Disposition: A | Discharge status: Home / Self Care | Payer: Medicare Other | Source: Ambulatory Visit | Attending: Orthopedic Surgery | Admitting: Orthopedic Surgery

## 2015-10-07 DIAGNOSIS — R531 Weakness: Secondary | ICD-10-CM

## 2015-10-07 DIAGNOSIS — M25512 Pain in left shoulder: Secondary | ICD-10-CM | POA: Diagnosis not present

## 2015-10-07 DIAGNOSIS — M24812 Other specific joint derangements of left shoulder, not elsewhere classified: Secondary | ICD-10-CM

## 2015-10-11 ENCOUNTER — Ambulatory Visit (INDEPENDENT_AMBULATORY_CARE_PROVIDER_SITE_OTHER): Payer: Medicare Other | Admitting: Licensed Clinical Social Worker

## 2015-10-11 DIAGNOSIS — F330 Major depressive disorder, recurrent, mild: Secondary | ICD-10-CM | POA: Diagnosis not present

## 2015-10-12 DIAGNOSIS — G4730 Sleep apnea, unspecified: Secondary | ICD-10-CM | POA: Diagnosis not present

## 2015-10-12 DIAGNOSIS — R0683 Snoring: Secondary | ICD-10-CM | POA: Diagnosis not present

## 2015-10-12 DIAGNOSIS — G4709 Other insomnia: Secondary | ICD-10-CM | POA: Diagnosis not present

## 2015-10-13 DIAGNOSIS — M25612 Stiffness of left shoulder, not elsewhere classified: Secondary | ICD-10-CM | POA: Diagnosis not present

## 2015-10-13 DIAGNOSIS — M19012 Primary osteoarthritis, left shoulder: Secondary | ICD-10-CM | POA: Diagnosis not present

## 2015-10-13 DIAGNOSIS — Z9889 Other specified postprocedural states: Secondary | ICD-10-CM | POA: Diagnosis not present

## 2015-10-19 ENCOUNTER — Ambulatory Visit (INDEPENDENT_AMBULATORY_CARE_PROVIDER_SITE_OTHER): Payer: Medicare Other | Admitting: Family Medicine

## 2015-10-19 ENCOUNTER — Encounter: Payer: Self-pay | Admitting: Family Medicine

## 2015-10-19 VITALS — BP 102/69 | HR 64 | Temp 98.6°F | Resp 20 | Wt 165.2 lb

## 2015-10-19 DIAGNOSIS — M546 Pain in thoracic spine: Secondary | ICD-10-CM | POA: Insufficient documentation

## 2015-10-19 MED ORDER — BACLOFEN 10 MG PO TABS: 10.0000 mg | ORAL_TABLET | Freq: Three times a day (TID) | ORAL | Status: DC

## 2015-10-19 MED ORDER — NAPROXEN 500 MG PO TABS: 500.0000 mg | ORAL_TABLET | Freq: Two times a day (BID) | ORAL | Status: DC

## 2015-10-19 MED FILL — NAPROXEN 500 MG TABLET: 500 | 15 days supply | Qty: 30 | Fill #0

## 2015-10-19 MED FILL — BACLOFEN 10 MG TABLET: 10 | 10 days supply | Qty: 30 | Fill #0

## 2015-10-19 NOTE — Progress Notes (Signed)
Patient ID: [name], female   DOB: 01-06-1946, 70 y.o.   MRN: [id number]    [name] , 07/06/1946, 70 y.o., female MRN: [id number]  CC: thoracic back pain Subjective: Pt presents for an acute OV with complaints of thoracic back pain  of 1 week ago  duration.  Associated symptoms include catch feeling with deep inspiration. Pt denies increase in activity, or injury. She has had a left shoulder surgery jan 16, PT ended last week. She will need shoulder replacement. Pt denies numbness or tingling. She denies radiation of pain.  Pt feels symptoms are worse with movement and better with sitting still. Pt has tried nothing to ease their symptoms.  Pt has h/o ankylosing spondylosis.   Allergies  Allergen Reactions  . Nitroglycerin Anaphylaxis    Hypotensive after NTG administration in ER during chest pain evaluation  . Pentosan Polysulfate Sodium     Elevated LFTs.......Marland Kitchen elmiron   . Bactrim [Sulfamethoxazole-Trimethoprim]   . Promethazine Hcl     "jittery on the inside"  . Lisinopril Cough  . Sulfamethoxazole-Trimethoprim Nausea Only   Social History  Substance Use Topics  . Smoking status: Never Smoker   . Smokeless tobacco: Never Used  . Alcohol Use: Yes     Comment: 1 glass of wine a years   Past Medical History  Diagnosis Date  . Hypertension   . Allergy     seasonal  . GERD (gastroesophageal reflux disease)   . Hyperlipidemia     NMR 2005  . Osteopenia     [facility]  . Hx of colonic polyps   . Diverticulosis of colon   . Interstitial cystitis   . Vitamin D deficiency   . Xerostomia     and Xeroophthalmia  . Arthritis   . Dry eye syndrome   . Osteoarthritis   . Cystitis   . Incontinence   . Ankylosing spondylitis ([place])   . HLA B27 (HLA B27 positive)   . Frozen shoulder   . Bell palsy   . Anxiety   . Osteopenia   . Subjective visual disturbance of both eyes 12/30/2013  . Menopause   . Sleep apnea   . CTS (carpal tunnel syndrome)     . Hypothyroidism   . Kidney stone    Past Surgical History  Procedure Laterality Date  . Total abdominal hysterectomy w/ bilateral salpingoophorectomy      dysfunctional menses  . Bladder surgery      for incontinence  . G 1 p 1    . Colonscopy      tics 10-2002  . Interstitial cystitis      with clot in catheter  . Elevated lfts      due to Elmiron  . Colonoscopy w/ polypectomy      adenomatous poyp 04-2008  . Finger surgery Left 2004    2nd finger  . Cystoscopy with retrograde pyelogram, ureteroscopy and stent placement Left 09/01/2013    Procedure: CYSTOSCOPY WITH RETROGRADE PYELOGRAM, AND LEFT STENT PLACEMENT;  Surgeon: Molli Hazard, MD;  Location: [facility];  Service: Urology;  Laterality: Left;  . Abdominal hysterectomy    . Carpal tunnel release Left 12/22/2014    Procedure: LEFT CARPAL TUNNEL RELEASE;  Surgeon: Daryll Brod, MD;  Location: [facility];  Service: Orthopedics;  Laterality: Left;  . Shoulder arthroscopy Left    Family History  Problem Relation Age of Onset  . Coronary artery disease Mother   .  Hypertension Mother   . Asthma Mother   . Osteoporosis Mother   . Dementia Mother   . Stroke Mother     mini cva  . Diabetes Father   . Cirrhosis Father     non alcoholic  . Cancer Sister     colon  . Colon cancer Sister 25  . Cancer Paternal Aunt     2 aunts had renal cancer  . Coronary artery disease Maternal Grandfather   . Cancer Maternal Aunt     breast  . Diabetes Paternal Aunt   . Diabetes Paternal Grandmother      Medication List       This list is accurate as of: 10/19/15  4:20 PM.  Always use your most recent med list.               ALEVE 220 MG Caps  Generic drug:  Naproxen Sodium  Take 1 each by mouth as needed.     aspirin 81 MG tablet  Take 81 mg by mouth daily.     cetirizine 10 MG tablet  Commonly known as:  ZYRTEC  Take 10 mg by mouth daily.     COSAMIN ASU ADVANCED FORMULA PO  Take 2 each by mouth  daily.     ibuprofen 800 MG tablet  Commonly known as:  ADVIL,MOTRIN  Take 800 mg by mouth.     metoprolol succinate 50 MG 24 hr tablet  Commonly known as:  TOPROL-XL  Take 1 tablet (50 mg total) by mouth daily. Take with or immediately following a meal.     ondansetron 8 MG disintegrating tablet  Commonly known as:  ZOFRAN-ODT  Take 1 tablet (8 mg total) by mouth every 12 (twelve) hours.     OSCIMIN 0.125 MG SL tablet  Generic drug:  hyoscyamine     SYSTANE OP  Apply 1 drop to eye as needed. Apply to both eyes as needed     Vitamin D-3 5000 UNITS Tabs  Take 5,000 Units by mouth 2 (two) times daily.     vitamin E 400 UNIT capsule  Take 400 Units by mouth daily.         ROS: Negative, with the exception of above mentioned in HPI   Objective:  BP 102/69 mmHg  Pulse 64  Temp(Src) 98.6 F (37 C)  Resp 20  Wt 165 lb 4 oz (74.957 kg)  SpO2 98% Body mass index is 29.28 kg/(m^2). Gen: Afebrile. No acute distress. Nontoxic in appearance, well developed, well nourished, caucasian female.  HENT: AT. [place]. MMM, no oral lesions.  Eyes:Pupils Equal Round Reactive to light, Extraocular movements intact,  Conjunctiva without redness, discharge or icterus. Chest: CTAB, no wheeze or crackles. Good air movement, normal resp effort.  MSK: no erythema, no soft tissue swelling. No bone/muscle tenderness. Discomfort with left SB and rotation. Ext normal, flexion normal until returning to neutral. neurovascularly intact.  Skin: no rashes, purpura or petechiae.  Neuro: Normal gait. PERLA. EOMi. Alert. Oriented x3 Assessment/Plan: [name] is a 70 y.o. female present for acute OV for  1. Midline thoracic back pain - possibly muscle strain vs bone spur with her history of Ankylosing spondylosis.  - baclofen (LIORESAL) 10 MG tablet; Take 1 tablet (10 mg total) by mouth 3 (three) times daily.  Dispense: 30 each; Refill: 0 - naproxen (NAPROSYN) 500 MG tablet; Take 1 tablet (500 mg total)  by mouth 2 (two) times daily with a meal.  Dispense: 30 tablet;  Refill: 0 - tramadol for severe pain, pt has script at home. - Naproxen BID with meals for 7 days. Then as needed. No other NSAIDS while using.  - Heat , ice therapy instructions given on proper technique.  - Baclofen prescribed, pt may not take because she is the caregiver to her husband.  - If not resolved in 3 weeks, would want to image  electronically signed by:  Howard Pouch, DO  [place]     \

## 2015-10-19 NOTE — Patient Instructions (Signed)
Baclofen prescribed, naproxen scheduled every 12 hours for 1 week.  Tramadol for severe pain only.  Heat/ice, no more than 15 minutes, never on direct skin.   3-4 weeks follow up, if no improved, would want to image.

## 2015-10-25 ENCOUNTER — Ambulatory Visit: Payer: Medicare Other | Admitting: Licensed Clinical Social Worker

## 2015-10-28 ENCOUNTER — Ambulatory Visit (INDEPENDENT_AMBULATORY_CARE_PROVIDER_SITE_OTHER): Payer: Medicare Other | Admitting: Internal Medicine

## 2015-10-28 ENCOUNTER — Encounter: Payer: Self-pay | Admitting: Internal Medicine

## 2015-10-28 VITALS — BP 140/88 | HR 54 | Temp 98.2°F | Resp 16 | Wt 165.0 lb

## 2015-10-28 DIAGNOSIS — R739 Hyperglycemia, unspecified: Secondary | ICD-10-CM

## 2015-10-28 DIAGNOSIS — M25512 Pain in left shoulder: Secondary | ICD-10-CM

## 2015-10-28 DIAGNOSIS — E782 Mixed hyperlipidemia: Secondary | ICD-10-CM

## 2015-10-28 DIAGNOSIS — R32 Unspecified urinary incontinence: Secondary | ICD-10-CM | POA: Insufficient documentation

## 2015-10-28 DIAGNOSIS — I10 Essential (primary) hypertension: Secondary | ICD-10-CM | POA: Diagnosis not present

## 2015-10-28 DIAGNOSIS — R7303 Prediabetes: Secondary | ICD-10-CM | POA: Diagnosis not present

## 2015-10-28 MED ORDER — BLOOD GLUCOSE MONITOR KIT: PACK | Status: DC

## 2015-10-28 NOTE — Assessment & Plan Note (Signed)
a1c 6.2% Increase exercise Low sugar/carb diet Work on weight loss Monitor a1c every 6 months

## 2015-10-28 NOTE — Assessment & Plan Note (Signed)
BP very well controlled at home Continue current medications Continue to monitor at home Increase exercise

## 2015-10-28 NOTE — Patient Instructions (Addendum)
Normal fasting sugar: 70-99 Sugar of 126 and higher is the diabetic range   All other Health Maintenance issues reviewed.   All recommended immunizations and age-appropriate screenings are up-to-date or discussed.  No immunizations administered today.   Medications reviewed and updated.  No changes recommended at this time.   Please followup in 6 months for a physical

## 2015-10-28 NOTE — Progress Notes (Signed)
Pre visit review using our clinic review tool, if applicable. No additional management support is needed unless otherwise documented below in the visit note. 

## 2015-10-28 NOTE — Progress Notes (Signed)
Subjective:    Patient ID: [name], female    DOB: [birth year], 70 y.o.   MRN: [id number]  HPI She is here for follow up.  Hypertension: She is taking her medication daily. She is compliant with a low sodium diet.  She denies chest pain, palpitations, edema, shortness of breath and regular headaches. She is not exercising regularly.  She does monitor her blood pressure at home and it is well controlled.  It has improved since increasing her medication.   Prediabetes:  She is not exercising regularly.  She is not always compliant with a low sugar/carb diet, but has cut back since her last visit.  Left shoulder pain:  She has chronic shoulder pain.  She does follow with orthopedics.  She was told she needs to have shoulder replacement, but wants to avoid it.  She has been taking aleve or advil and it helps.    She is tired all the time even after she sleeps.  She does have sleep apnea that is mild.  She has not needed any treatment for it.  She does have a repeat sleep study to re-evaluate.  Her shoulder pain wakes up at night as well.    She feels dizzy at times.   It occurs randomly and is more of an unbalanced feeling.  She denies lightheadedness.    Medications and allergies reviewed with patient and updated if appropriate.  Patient Active Problem List   Diagnosis Date Noted  . Thoracic back pain 10/19/2015  . Muscle cramps 09/29/2015  . Prediabetes 09/29/2015  . Acute sinusitis 09/08/2015  . History of arthroscopy of shoulder 07/06/2015  . Pain in shoulder 02/16/2015  . Impingement syndrome of left shoulder region 09/22/2014  . Interstitial cystitis 07/09/2014  . Elevated LFTs 07/09/2014  . Elevated TSH 07/09/2014  . Diverticulosis of colon without hemorrhage 07/08/2014  . S/P total hysterectomy and bilateral salpingo-oophorectomy 04/12/2014  . Ureteral calculus, right 04/12/2014  . Carpal tunnel syndrome 04/12/2014  . Food allergy 03/06/2014  . Renal calculi  01/30/2014  . OSA (obstructive sleep apnea) 01/14/2014  . Subjective visual disturbance of both eyes 12/30/2013  . Visual changes 12/17/2013  . Elevated serum creatinine 12/16/2013  . Other abnormal glucose 12/16/2013  . Seasonal and perennial allergic rhinitis 12/06/2013  . Seasonal allergic conjunctivitis 12/06/2013  . Pneumonia, primary atypical 05/15/2013  . Chest pain 01/03/2013  . History of Bell's palsy 02/26/2012  . Vitamin D deficiency 06/27/2010  . 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  . DRY MOUTH 06/27/2010  . LOW BACK PAIN, CHRONIC 08/16/2009  . ABDOMINAL PAIN, SUPRAPUBIC 05/25/2009  . GERD 04/30/2009  . DIVERTICULOSIS, COLON 04/30/2009  . OSTEOPENIA 04/30/2009  . History of colonic polyps 04/30/2009  . COSTOCHONDRITIS 01/15/2008  . HYPERLIPIDEMIA 08/21/2007  . Essential hypertension 08/21/2007  . ARTHRALGIA 08/21/2007  . BACK PAIN WITH RADICULOPATHY 01/25/2007    Current Outpatient Prescriptions on File Prior to Visit  Medication Sig Dispense Refill  . aspirin 81 MG tablet Take 81 mg by mouth daily.      . baclofen (LIORESAL) 10 MG tablet Take 1 tablet (10 mg total) by mouth 3 (three) times daily. 30 each 0  . cetirizine (ZYRTEC) 10 MG tablet Take 10 mg by mouth daily.      . Cholecalciferol (VITAMIN D-3) 5000 UNITS TABS Take 5,000 Units by mouth 2 (two) times daily.     Marland Kitchen ibuprofen (ADVIL,MOTRIN) 800 MG tablet Take 800 mg by mouth.    . metoprolol  succinate (TOPROL-XL) 50 MG 24 hr tablet Take 1 tablet (50 mg total) by mouth daily. Take with or immediately following a meal. 90 tablet 1  . Misc Natural Products (COSAMIN ASU ADVANCED FORMULA PO) Take 2 each by mouth daily.    . naproxen (NAPROSYN) 500 MG tablet Take 1 tablet (500 mg total) by mouth 2 (two) times daily with a meal. 30 tablet 0  . ondansetron (ZOFRAN-ODT) 8 MG disintegrating tablet Take 1 tablet (8 mg total) by mouth every 12 (twelve) hours. 20 tablet 0  . OSCIMIN 0.125 MG SL tablet     . Polyethyl  Glycol-Propyl Glycol (SYSTANE OP) Apply 1 drop to eye as needed. Apply to both eyes as needed    . vitamin E 400 UNIT capsule Take 400 Units by mouth daily.     No current facility-administered medications on file prior to visit.    Past Medical History  Diagnosis Date  . Hypertension   . Allergy     seasonal  . GERD (gastroesophageal reflux disease)   . Hyperlipidemia     NMR 2005  . Osteopenia     [facility]  . Hx of colonic polyps   . Diverticulosis of colon   . Interstitial cystitis   . Vitamin D deficiency   . Xerostomia     and Xeroophthalmia  . Arthritis   . Dry eye syndrome   . Osteoarthritis   . Cystitis   . Incontinence   . Ankylosing spondylitis ([place])   . HLA B27 (HLA B27 positive)   . Frozen shoulder   . Bell palsy   . Anxiety   . Osteopenia   . Subjective visual disturbance of both eyes 12/30/2013  . Menopause   . Sleep apnea   . CTS (carpal tunnel syndrome)   . Hypothyroidism   . Kidney stone     Past Surgical History  Procedure Laterality Date  . Total abdominal hysterectomy w/ bilateral salpingoophorectomy      dysfunctional menses  . Bladder surgery      for incontinence  . G 1 p 1    . Colonscopy      tics 10-2002  . Interstitial cystitis      with clot in catheter  . Elevated lfts      due to Elmiron  . Colonoscopy w/ polypectomy      adenomatous poyp 04-2008  . Finger surgery Left 2004    2nd finger  . Cystoscopy with retrograde pyelogram, ureteroscopy and stent placement Left 09/01/2013    Procedure: CYSTOSCOPY WITH RETROGRADE PYELOGRAM, AND LEFT STENT PLACEMENT;  Surgeon: Molli Hazard, MD;  Location: [facility];  Service: Urology;  Laterality: Left;  . Abdominal hysterectomy    . Carpal tunnel release Left 12/22/2014    Procedure: LEFT CARPAL TUNNEL RELEASE;  Surgeon: Daryll Brod, MD;  Location: [facility];  Service: Orthopedics;  Laterality: Left;  . Shoulder arthroscopy Left     Social  History   Social History  . Marital Status: Married    Spouse Name: N/A  . Number of Children: 1  . Years of Education: BS   Occupational History  . retired   . teacher     Kindergarten   Social History Main Topics  . Smoking status: Never Smoker   . Smokeless tobacco: Never Used  . Alcohol Use: Yes     Comment: 1 glass of wine a years  . Drug Use: No  .  Sexual Activity: Not Currently   Other Topics Concern  . Not on file   Social History Narrative   Married          Family History  Problem Relation Age of Onset  . Coronary artery disease Mother   . Hypertension Mother   . Asthma Mother   . Osteoporosis Mother   . Dementia Mother   . Stroke Mother     mini cva  . Diabetes Father   . Cirrhosis Father     non alcoholic  . Cancer Sister     colon  . Colon cancer Sister 65  . Cancer Paternal Aunt     2 aunts had renal cancer  . Coronary artery disease Maternal Grandfather   . Cancer Maternal Aunt     breast  . Diabetes Paternal Aunt   . Diabetes Paternal Grandmother     Review of Systems  Constitutional: Positive for fatigue. Negative for fever.  HENT: Positive for postnasal drip.   Eyes: Positive for visual disturbance.  Respiratory: Positive for cough (allergy related, pnd). Negative for shortness of breath and wheezing.   Cardiovascular: Positive for chest pain (occasional) and palpitations (occasional at night when she first lays down). Negative for leg swelling.  Gastrointestinal: Negative for abdominal pain.       No gerd  Neurological: Positive for dizziness (occasional, random occuring) and light-headedness (rare). Negative for headaches.       Objective:   Filed Vitals:   10/28/15 0804  BP: 140/88  Pulse: 54  Temp: 98.2 F (36.8 C)  Resp: 16   Filed Weights   10/28/15 0804  Weight: 165 lb (74.844 kg)   Body mass index is 29.24 kg/(m^2).   Physical Exam Constitutional: Appears well-developed and well-nourished. No distress.    Neck: Neck supple. No tracheal deviation present. No thyromegaly present.  No carotid bruit. No cervical adenopathy.   Cardiovascular: Normal rate, regular rhythm and normal heart sounds.   No murmur heard.  No edema Pulmonary/Chest: Effort normal and breath sounds normal. No respiratory distress. No wheezes.  Psych: normal mood and affect       Assessment & Plan:   See Problem List for Assessment and Plan of chronic medical problems.  F/u 6 months for a wellness visit - f/u

## 2015-10-28 NOTE — Assessment & Plan Note (Signed)
Not on medication Will monitor  Increase exercise Work on weight loss

## 2015-10-28 NOTE — Assessment & Plan Note (Addendum)
Aleve or advil - discussed concerns with long term use - benefits outweigh the risks Will continue nsaids  Following with ortho

## 2015-11-04 DIAGNOSIS — M19012 Primary osteoarthritis, left shoulder: Secondary | ICD-10-CM | POA: Diagnosis not present

## 2015-11-04 DIAGNOSIS — Z79899 Other long term (current) drug therapy: Secondary | ICD-10-CM | POA: Diagnosis not present

## 2015-11-04 DIAGNOSIS — Z888 Allergy status to other drugs, medicaments and biological substances status: Secondary | ICD-10-CM | POA: Diagnosis not present

## 2015-11-04 DIAGNOSIS — I10 Essential (primary) hypertension: Secondary | ICD-10-CM | POA: Diagnosis not present

## 2015-11-04 DIAGNOSIS — Z7982 Long term (current) use of aspirin: Secondary | ICD-10-CM | POA: Diagnosis not present

## 2015-11-08 ENCOUNTER — Ambulatory Visit (INDEPENDENT_AMBULATORY_CARE_PROVIDER_SITE_OTHER): Payer: Medicare Other | Admitting: Family Medicine

## 2015-11-08 ENCOUNTER — Encounter: Payer: Self-pay | Admitting: Family Medicine

## 2015-11-08 VITALS — BP 120/80 | HR 60 | Ht 62.0 in | Wt 165.0 lb

## 2015-11-08 DIAGNOSIS — E559 Vitamin D deficiency, unspecified: Secondary | ICD-10-CM

## 2015-11-08 DIAGNOSIS — M459 Ankylosing spondylitis of unspecified sites in spine: Secondary | ICD-10-CM | POA: Diagnosis not present

## 2015-11-08 DIAGNOSIS — M546 Pain in thoracic spine: Secondary | ICD-10-CM | POA: Diagnosis not present

## 2015-11-08 MED ORDER — MELOXICAM 15 MG PO TABS: 15.0000 mg | ORAL_TABLET | Freq: Every day | ORAL | Status: DC

## 2015-11-08 MED ORDER — VITAMIN D (ERGOCALCIFEROL) 1.25 MG (50000 UNIT) PO CAPS: 50000.0000 [IU] | ORAL_CAPSULE | ORAL | Status: DC

## 2015-11-08 MED FILL — VIT D2 1.25 MG (50,000 UNIT: 1.25 MG | 84 days supply | Qty: 12 | Fill #0

## 2015-11-08 MED FILL — MELOXICAM 15 MG TABLET: 15 | 30 days supply | Qty: 30 | Fill #0

## 2015-11-08 NOTE — Assessment & Plan Note (Signed)
Likely secondary to ankylosing spondylosis. We'll continue to monitor closely. Do not feel that further x-rays are necessary at this time. Follow-up in 4 weeks. We discussed posturing or not asked.

## 2015-11-08 NOTE — Progress Notes (Signed)
[name] [address] [place], [place] [address] Phone: [phone] Subjective:    I'm seeing this patient by the request  of:  Unice Cobble, MD   CC: multiple joint pains especially back  BPZ:WCHENIDPOE [name] is a 70 y.o. female coming in with complaint of *back pain. Patient has had a history previously of ankylosing spondylitis. Positive HLA-B27. States that from time to time she feels like she has swelling in her back. Seems to last for approximately 2 weeks since last go away. Patient states it does respond over-the-counter anti-inflammatory's. We discussed icing regimen when she has not done regularly. States that it seems to be happening more frequently. Has severe arthritic changes in her shoulders bilaterally and she continues to monitor closely. States though that no radiation dose down to her legs on a regular basis but can occur infrequently. No weakness. Sometimes painful enough to stop her from activities.     Past Medical History  Diagnosis Date  . Hypertension   . Allergy     seasonal  . GERD (gastroesophageal reflux disease)   . Hyperlipidemia     NMR 2005  . Osteopenia     [facility]  . Hx of colonic polyps   . Diverticulosis of colon   . Interstitial cystitis   . Vitamin D deficiency   . Xerostomia     and Xeroophthalmia  . Arthritis   . Dry eye syndrome   . Osteoarthritis   . Cystitis   . Incontinence   . Ankylosing spondylitis ([place])   . HLA B27 (HLA B27 positive)   . Frozen shoulder   . Bell palsy   . Anxiety   . Osteopenia   . Subjective visual disturbance of both eyes 12/30/2013  . Menopause   . Sleep apnea   . CTS (carpal tunnel syndrome)   . Hypothyroidism   . Kidney stone    Past Surgical History  Procedure Laterality Date  . Total abdominal hysterectomy w/ bilateral salpingoophorectomy      dysfunctional menses  . Bladder surgery      for incontinence  . G 1 p 1    .  Colonscopy      tics 10-2002  . Interstitial cystitis      with clot in catheter  . Elevated lfts      due to Elmiron  . Colonoscopy w/ polypectomy      adenomatous poyp 04-2008  . Finger surgery Left 2004    2nd finger  . Cystoscopy with retrograde pyelogram, ureteroscopy and stent placement Left 09/01/2013    Procedure: CYSTOSCOPY WITH RETROGRADE PYELOGRAM, AND LEFT STENT PLACEMENT;  Surgeon: Molli Hazard, MD;  Location: [facility];  Service: Urology;  Laterality: Left;  . Abdominal hysterectomy    . Carpal tunnel release Left 12/22/2014    Procedure: LEFT CARPAL TUNNEL RELEASE;  Surgeon: Daryll Brod, MD;  Location: [facility];  Service: Orthopedics;  Laterality: Left;  . Shoulder arthroscopy Left    Social History   Social History  . Marital Status: Married    Spouse Name: N/A  . Number of Children: 1  . Years of Education: BS   Occupational History  . retired   . teacher     Kindergarten   Social History Main Topics  . Smoking status: Never Smoker   . Smokeless tobacco: Never Used  . Alcohol Use: Yes     Comment: 1  glass of wine a years  . Drug Use: No  . Sexual Activity: Not Currently   Other Topics Concern  . None   Social History Narrative   Married         Allergies  Allergen Reactions  . Nitroglycerin Anaphylaxis    Hypotensive after NTG administration in ER during chest pain evaluation  . Pentosan Polysulfate Sodium     Elevated LFTs.......Marland Kitchen elmiron   . Bactrim [Sulfamethoxazole-Trimethoprim]   . Promethazine Hcl     "jittery on the inside"  . Lisinopril Cough  . Sulfamethoxazole-Trimethoprim Nausea Only   Family History  Problem Relation Age of Onset  . Coronary artery disease Mother   . Hypertension Mother   . Asthma Mother   . Osteoporosis Mother   . Dementia Mother   . Stroke Mother     mini cva  . Diabetes Father   . Cirrhosis Father     non alcoholic  . Cancer Sister     colon  . Colon cancer Sister 86  . Cancer  Paternal Aunt     2 aunts had renal cancer  . Coronary artery disease Maternal Grandfather   . Cancer Maternal Aunt     breast  . Diabetes Paternal Aunt   . Diabetes Paternal Grandmother     Past medical history, social, surgical and family history all reviewed in electronic medical record.  No pertanent information unless stated regarding to the chief complaint.   Review of Systems: No headache, visual changes, nausea, vomiting, diarrhea, constipation, dizziness, abdominal pain, skin rash, fevers, chills, night sweats, weight loss, swollen lymph nodes, body aches, joint swelling, muscle aches, chest pain, shortness of breath, mood changes.   Objective Blood pressure 120/80, pulse 60, height _0  (1.575 m), weight 165 lb (74.844 kg), SpO2 97 %.  General: No apparent distress alert and oriented x3 mood and affect normal, dressed appropriately.  HEENT: Pupils equal, extraocular movements intact  Respiratory: Patient's speak in full sentences and does not appear short of breath  Cardiovascular: No lower extremity edema, non tender, no erythema  Skin: Warm dry intact with no signs of infection or rash on extremities or on axial skeleton.  Abdomen: Soft nontender  Neuro: Cranial nerves II through XII are intact, neurovascularly intact in all extremities with 2+ DTRs and 2+ pulses.  Lymph: No lymphadenopathy of posterior or anterior cervical chain or axillae bilaterally.  Gait normal with good balance and coordination.  MSK:  Non tender with full range of motion and good stability and symmetric strength and tone of shoulders, elbows, wrist, hip, knee and ankles bilaterally.  Mild arthritic changes of multiple joints. Patient does have some pain in many different areas. Over the soft tissue as well as somewhat over the back. Back Exam:  Inspection: Unremarkable  Motion: Flexion 35 deg, Extension 15 deg, Side Bending to 35 deg bilaterally,  Rotation to 35 deg bilaterally  SLR laying: Negative    XSLR laying: Negative  Palpable tenderness: tenderness to palpation in the paraspinal musculature of the thoracic and lumbar as well as sacrum FABER: bilateral. Sensory change: Gross sensation intact to all lumbar and sacral dermatomes.  Reflexes: 2+ at both patellar tendons, 2+ at achilles tendons, Babinski's downgoing.  Strength at foot  Plantar-flexion: 5/5 Dorsi-flexion: 5/5 Eversion: 5/5 Inversion: 5/5  Leg strength  Quad: 5/5 Hamstring: 5/5 Hip flexor: 5/5 Hip abductors: 3/5       Impression and Recommendations:     This case required medical decision making  of moderate complexity.      Note: This dictation was prepared with Dragon dictation along with smaller phrase technology. Any transcriptional errors that result from this process are unintentional.

## 2015-11-08 NOTE — Assessment & Plan Note (Signed)
Given prescription for once weekly for the next 12 weeks. We will monitor. Worsening symptoms or any type of kidney stones we may need to discontinue. Patient did elected try.

## 2015-11-08 NOTE — Patient Instructions (Signed)
Good to see you.  Ice 20 minutes 2 times daily. Usually after activity and before bed. Meloxicam daily for 10 days then as needed Stay active!!!! Once weekly vitamin D for next 12 weeks Take tylenol 500 mg three times a day is the best evidence based medicine we have for arthritis.  Fish oil 2 grams daily.  Tumeric 500mg  twice daily. Tart cherry extract any dose at night  Water aerobics and cycling with low resistance are the best two types of exercise for arthritis. Come back and see me in 4 weeks.

## 2015-11-08 NOTE — Assessment & Plan Note (Signed)
HLA B27 +, daughter positive as well Discuss OTC meds, icing, we discussed the importance of core strength. We discussed which activities to avoid. We discussed the importance of shoes. Patient is going to try to make these different changes. We discussed if any worsening symptoms that rheumatology may be necessary. Description for anti-inflammatory given for any breakthrough. Warned the potential side effects. Follow-up again in 4 weeks to see how she is responding.

## 2015-11-08 NOTE — Progress Notes (Signed)
Pre visit review using our clinic review tool, if applicable. No additional management support is needed unless otherwise documented below in the visit note. 

## 2015-12-06 ENCOUNTER — Ambulatory Visit (INDEPENDENT_AMBULATORY_CARE_PROVIDER_SITE_OTHER): Payer: Medicare Other | Admitting: Family Medicine

## 2015-12-06 ENCOUNTER — Telehealth: Payer: Self-pay

## 2015-12-06 ENCOUNTER — Other Ambulatory Visit: Payer: Self-pay

## 2015-12-06 VITALS — BP 118/76 | HR 66 | Wt 163.0 lb

## 2015-12-06 DIAGNOSIS — M25512 Pain in left shoulder: Secondary | ICD-10-CM

## 2015-12-06 DIAGNOSIS — M459 Ankylosing spondylitis of unspecified sites in spine: Secondary | ICD-10-CM

## 2015-12-06 MED ORDER — MELOXICAM 15 MG PO TABS: 15.0000 mg | ORAL_TABLET | Freq: Every day | ORAL | Status: DC

## 2015-12-06 MED FILL — MELOXICAM 15 MG TABLET: 15 | 90 days supply | Qty: 90 | Fill #0

## 2015-12-06 NOTE — Assessment & Plan Note (Signed)
Patient has a positive HLA-B27. Has responded well to conservative therapy. Encourage patient to continue the same medications at this time. We did discuss about patient having some mild cramping what else could be beneficial. We discussed the icing regimen. We discussed the importance of proper shoewear. Patient knows if any exacerbating factors and patient can come back. Patient otherwise will continue this we'll follow-up with her in 2 months.  Spent  25 minutes with patient face-to-face and had greater than 50% of counseling including as described above in assessment and plan.

## 2015-12-06 NOTE — Telephone Encounter (Signed)
Sent rx in to pharmacy 

## 2015-12-06 NOTE — Telephone Encounter (Signed)
meloxicam (MOBIC) 15 MG tablet [id number]  Patient said Dr. Tamala Julian was going to give her 73 supply and 2 refills of this medication. But Pharmacy didn't have anything. Please follow up

## 2015-12-06 NOTE — Progress Notes (Signed)
[name] [address] [place], [place] [address] Phone: [phone] Subjective:    I'm seeing this patient by the request  of:  Binnie Rail, MD   CC: Follow-up ankle since spondylitis  GYI:RSWNIOEVOJ JOYCELYN [name] is a 70 y.o. female coming in with complaint of *back pain. Patient has had a history previously of ankylosing spondylitis. Positive HLA-B27. States that from time to time she feels like she has swelling in her back.  Patient was seen previously and was diagnosed with more of arthritic changes. We do not see any significant inflammation of the moment but with patient history of HLA-B27 anything patient has flares that are secondary to an ankle since spondylitis. Patient has been doing the over-the-counter medications, icing protocol, as well as doing some the exercises. Patient states since last time we seen her she's only had one day where she had an exacerbation where her back was severe enough that stops her from activities. States that this does not seem to be a constant thing. Did take the meloxicam regularly which was have been beneficial as well as a vitamin D supplementation. Patient is been doing the icing protocol and intermittently. Does feel about 30-40% better overall. Optimistic about her now.     Past Medical History  Diagnosis Date  . Hypertension   . Allergy     seasonal  . GERD (gastroesophageal reflux disease)   . Hyperlipidemia     NMR 2005  . Osteopenia     [facility]  . Hx of colonic polyps   . Diverticulosis of colon   . Interstitial cystitis   . Vitamin D deficiency   . Xerostomia     and Xeroophthalmia  . Arthritis   . Dry eye syndrome   . Osteoarthritis   . Cystitis   . Incontinence   . Ankylosing spondylitis ([place])   . HLA B27 (HLA B27 positive)   . Frozen shoulder   . Bell palsy   . Anxiety   . Osteopenia   . Subjective visual disturbance of both eyes 12/30/2013  . Menopause     . Sleep apnea   . CTS (carpal tunnel syndrome)   . Hypothyroidism   . Kidney stone    Past Surgical History  Procedure Laterality Date  . Total abdominal hysterectomy w/ bilateral salpingoophorectomy      dysfunctional menses  . Bladder surgery      for incontinence  . G 1 p 1    . Colonscopy      tics 10-2002  . Interstitial cystitis      with clot in catheter  . Elevated lfts      due to Elmiron  . Colonoscopy w/ polypectomy      adenomatous poyp 04-2008  . Finger surgery Left 2004    2nd finger  . Cystoscopy with retrograde pyelogram, ureteroscopy and stent placement Left 09/01/2013    Procedure: CYSTOSCOPY WITH RETROGRADE PYELOGRAM, AND LEFT STENT PLACEMENT;  Surgeon: Molli Hazard, MD;  Location: [facility];  Service: Urology;  Laterality: Left;  . Abdominal hysterectomy    . Carpal tunnel release Left 12/22/2014    Procedure: LEFT CARPAL TUNNEL RELEASE;  Surgeon: Daryll Brod, MD;  Location: [place];  Service: Orthopedics;  Laterality: Left;  . Shoulder arthroscopy Left    Social History   Social History  . Marital Status: Married    Spouse Name: N/A  . Number  of Children: 1  . Years of Education: BS   Occupational History  . retired   . teacher     Kindergarten   Social History Main Topics  . Smoking status: Never Smoker   . Smokeless tobacco: Never Used  . Alcohol Use: Yes     Comment: 1 glass of wine a years  . Drug Use: No  . Sexual Activity: Not Currently   Other Topics Concern  . Not on file   Social History Narrative   Married         Allergies  Allergen Reactions  . Nitroglycerin Anaphylaxis    Hypotensive after NTG administration in ER during chest pain evaluation  . Pentosan Polysulfate Sodium     Elevated LFTs.......Marland Kitchen elmiron   . Bactrim [Sulfamethoxazole-Trimethoprim]   . Promethazine Hcl     "jittery on the inside"  . Lisinopril Cough  . Sulfamethoxazole-Trimethoprim Nausea Only   Family History  Problem  Relation Age of Onset  . Coronary artery disease Mother   . Hypertension Mother   . Asthma Mother   . Osteoporosis Mother   . Dementia Mother   . Stroke Mother     mini cva  . Diabetes Father   . Cirrhosis Father     non alcoholic  . Cancer Sister     colon  . Colon cancer Sister 69  . Cancer Paternal Aunt     2 aunts had renal cancer  . Coronary artery disease Maternal Grandfather   . Cancer Maternal Aunt     breast  . Diabetes Paternal Aunt   . Diabetes Paternal Grandmother     Past medical history, social, surgical and family history all reviewed in electronic medical record.  No pertanent information unless stated regarding to the chief complaint.   Review of Systems: No headache, visual changes, nausea, vomiting, diarrhea, constipation, dizziness, abdominal pain, skin rash, fevers, chills, night sweats, weight loss, swollen lymph nodes, body aches, joint swelling, muscle aches, chest pain, shortness of breath, mood changes.   Objective Blood pressure 118/76, pulse 66, weight 163 lb (73.936 kg).  General: No apparent distress alert and oriented x3 mood and affect normal, dressed appropriately.  HEENT: Pupils equal, extraocular movements intact  Respiratory: Patient's speak in full sentences and does not appear short of breath  Cardiovascular: No lower extremity edema, non tender, no erythema  Skin: Warm dry intact with no signs of infection or rash on extremities or on axial skeleton.  Abdomen: Soft nontender  Neuro: Cranial nerves II through XII are intact, neurovascularly intact in all extremities with 2+ DTRs and 2+ pulses.  Lymph: No lymphadenopathy of posterior or anterior cervical chain or axillae bilaterally.  Gait normal with good balance and coordination.  MSK:  Non tender with full range of motion and good stability and symmetric strength and tone of shoulders, elbows, wrist, hip, knee and ankles bilaterally.  Mild arthritic changes of multiple joints. She is not  as tender as she was previously. Back Exam:  Inspection: Unremarkable  Motion: Flexion 35 deg, Extension 15 deg, Side Bending to 35 deg bilaterally,  Rotation to 35 deg bilaterally  SLR laying: Negative  XSLR laying: Negative  Palpable tenderness: Continued mild tenderness of the paraspinal musculature of the lumbar spine FABER: bilateral. Sensory change: Gross sensation intact to all lumbar and sacral dermatomes.  Reflexes: 2+ at both patellar tendons, 2+ at achilles tendons, Babinski's downgoing.  Strength at foot  Plantar-flexion: 5/5 Dorsi-flexion: 5/5 Eversion: 5/5 Inversion: 5/5  Leg strength  Quad: 5/5 Hamstring: 5/5 Hip flexor: 5/5 Hip abductors: 3/5       Impression and Recommendations:     This case required medical decision making of moderate complexity.      Note: This dictation was prepared with Dragon dictation along with smaller phrase technology. Any transcriptional errors that result from this process are unintentional.

## 2015-12-06 NOTE — Patient Instructions (Signed)
Good to see you  Ice 20 minutes 2 times daily. Usually after activity and before bed. pennsaid pinkie amount topically 2 times daily as needed.  COntinue the vitamins Stay active Iron 65mg  daily with 500mg  of vitamin C and if constipation occurs then do only 3 times a week I will see you again in 6-8 weeks.

## 2015-12-07 ENCOUNTER — Telehealth: Payer: Self-pay | Admitting: Family Medicine

## 2015-12-07 NOTE — Telephone Encounter (Signed)
Left detailed msg on pt's vmail advising her that the dr. Tamala Julian decided not to do an [place]. The order just needs to be cancelled & she will not be charged.

## 2015-12-07 NOTE — Telephone Encounter (Signed)
Patient states she looked at my chart and it states she had an ultrasound yesterday with Dr. Tamala Julian.  Patient states she did not have ultrasound done.  Can you please follow up in regards?

## 2015-12-16 MED FILL — FREESTYLE LITE METER: 30 days supply | Qty: 1 | Fill #0

## 2015-12-16 MED FILL — FREESTYLE LANCETS: 25 days supply | Qty: 100 | Fill #0

## 2015-12-16 MED FILL — FREESTYLE LITE TEST STRIP: 25 days supply | Qty: 100 | Fill #0

## 2015-12-18 DIAGNOSIS — G4733 Obstructive sleep apnea (adult) (pediatric): Secondary | ICD-10-CM | POA: Diagnosis not present

## 2015-12-18 DIAGNOSIS — G4730 Sleep apnea, unspecified: Secondary | ICD-10-CM | POA: Diagnosis not present

## 2015-12-18 DIAGNOSIS — G4700 Insomnia, unspecified: Secondary | ICD-10-CM | POA: Diagnosis not present

## 2015-12-18 DIAGNOSIS — R0683 Snoring: Secondary | ICD-10-CM | POA: Diagnosis not present

## 2015-12-21 DIAGNOSIS — G4733 Obstructive sleep apnea (adult) (pediatric): Secondary | ICD-10-CM | POA: Diagnosis not present

## 2015-12-24 ENCOUNTER — Telehealth: Payer: Self-pay | Admitting: Internal Medicine

## 2015-12-24 NOTE — Telephone Encounter (Signed)
Dr. Hilarie Fredrickson has approved switch from Dr. Ardis Hughs per 05/2015 telephone note.

## 2015-12-24 NOTE — Telephone Encounter (Signed)
Pt states she has been having BRB from rectum, not sure if hemorrhoids or blood in stool. Pt scheduled to see Amy Esterwood PA 01/03/16@10 :30am. Pt aware of appt.

## 2015-12-30 DIAGNOSIS — G4733 Obstructive sleep apnea (adult) (pediatric): Secondary | ICD-10-CM | POA: Diagnosis not present

## 2016-01-03 ENCOUNTER — Encounter: Payer: Self-pay | Admitting: Physician Assistant

## 2016-01-03 ENCOUNTER — Other Ambulatory Visit: Payer: Self-pay | Admitting: *Deleted

## 2016-01-03 ENCOUNTER — Other Ambulatory Visit (INDEPENDENT_AMBULATORY_CARE_PROVIDER_SITE_OTHER): Payer: Medicare Other

## 2016-01-03 ENCOUNTER — Ambulatory Visit (INDEPENDENT_AMBULATORY_CARE_PROVIDER_SITE_OTHER): Payer: Medicare Other | Admitting: Physician Assistant

## 2016-01-03 VITALS — BP 130/78 | HR 68 | Ht 61.5 in | Wt 165.0 lb

## 2016-01-03 DIAGNOSIS — Z86010 Personal history of colonic polyps: Secondary | ICD-10-CM | POA: Diagnosis not present

## 2016-01-03 DIAGNOSIS — R109 Unspecified abdominal pain: Secondary | ICD-10-CM

## 2016-01-03 DIAGNOSIS — K625 Hemorrhage of anus and rectum: Secondary | ICD-10-CM

## 2016-01-03 DIAGNOSIS — R1032 Left lower quadrant pain: Secondary | ICD-10-CM | POA: Diagnosis not present

## 2016-01-03 DIAGNOSIS — Z800 Family history of malignant neoplasm of digestive organs: Secondary | ICD-10-CM

## 2016-01-03 LAB — CBC WITH DIFFERENTIAL/PLATELET
BASOS ABS: 0 10*3/uL (ref 0.0–0.1)
Basophils Relative: 0.5 % (ref 0.0–3.0)
EOS ABS: 0.2 10*3/uL (ref 0.0–0.7)
Eosinophils Relative: 2.7 % (ref 0.0–5.0)
HEMATOCRIT: 38.1 % (ref 36.0–46.0)
Hemoglobin: 12.8 g/dL (ref 12.0–15.0)
LYMPHS PCT: 26.1 % (ref 12.0–46.0)
Lymphs Abs: 1.7 10*3/uL (ref 0.7–4.0)
MCHC: 33.5 g/dL (ref 30.0–36.0)
MCV: 87.1 fl (ref 78.0–100.0)
Monocytes Absolute: 0.5 10*3/uL (ref 0.1–1.0)
Monocytes Relative: 8.5 % (ref 3.0–12.0)
NEUTROS ABS: 4 10*3/uL (ref 1.4–7.7)
NEUTROS PCT: 62.2 % (ref 43.0–77.0)
PLATELETS: 254 10*3/uL (ref 150.0–400.0)
RBC: 4.37 Mil/uL (ref 3.87–5.11)
RDW: 13.2 % (ref 11.5–15.5)
WBC: 6.4 10*3/uL (ref 4.0–10.5)

## 2016-01-03 MED ORDER — NA SULFATE-K SULFATE-MG SULF 17.5-3.13-1.6 GM/177ML PO SOLN: 1.0000 | Freq: Once | ORAL | Status: DC

## 2016-01-03 MED ORDER — HYOSCYAMINE SULFATE 0.125 MG SL SUBL: 0.1250 mg | SUBLINGUAL_TABLET | SUBLINGUAL | Status: DC | PRN

## 2016-01-03 MED FILL — SUPREP BOWEL PREP KIT: 17.5-3.13-1 | 1 days supply | Qty: 354 | Fill #0

## 2016-01-03 NOTE — Progress Notes (Signed)
Patient ID: [name], female   DOB: Dec 21, 1945, 70 y.o.   MRN: [id number]   Subjective:    Patient ID: [name], female    DOB: Mar 11, 1946, 70 y.o.   MRN: [id number]  HPI  Shawn is a pleasant 70 year old female known previously to Dr. Ardis Hughs who is changing care to Dr. Hilarie Fredrickson. She has history of multiple adenomatous colon polyps and last underwent colonoscopy in December 2014. She was found to have 9 polyps and a few left-sided diverticuli. There were no internal hemorrhoids noted. Path showed 6 tubular adenomas, and the other polyps were hyperplastic. She was recommended for follow-up in 3 years. Of note , patient also has family history of colon cancer in her sister. She comes in today with complaints of seeing blood in her stools off and on over the past 4-6 weeks. She says she can't tell if the blood is actually mixed in with a bowel movement or on the stool. She is also been having new sharp "twisting" pains in her lower abdomen prior to having a bowel movement. She says this eases off after a bowel movement. She has never had this in the past. She also had an increase in abdominal gas. She says on a few occasions she has noticed some blood on the tissue when she wipes even after urination. She does have an external hemorrhoid but is not sure that this is what is causing her bleeding. She has been taking some NSAIDs periodically for shoulder pain but not on a daily basis. Other medical problems include hypertension and ankylosing spondylitis.  Review of Systems Pertinent positive and negative review of systems were noted in the above HPI section.  All other review of systems was otherwise negative.  Outpatient Encounter Prescriptions as of 01/03/2016  Medication Sig  . aspirin 81 MG tablet Take 81 mg by mouth daily.    . blood glucose meter kit and supplies KIT Dispense based on patient and insurance preference. Use up to four times daily as directed. prediabetes  . cetirizine (ZYRTEC) 10  MG tablet Take 10 mg by mouth daily.    Marland Kitchen ibuprofen (ADVIL,MOTRIN) 800 MG tablet Take 800 mg by mouth.  . meloxicam (MOBIC) 15 MG tablet Take 1 tablet (15 mg total) by mouth daily.  . metoprolol succinate (TOPROL-XL) 50 MG 24 hr tablet Take 1 tablet (50 mg total) by mouth daily. Take with or immediately following a meal.  . Misc Natural Products (COSAMIN ASU ADVANCED FORMULA PO) Take 2 each by mouth daily.  . OSCIMIN 0.125 MG SL tablet   . Polyethyl Glycol-Propyl Glycol (SYSTANE OP) Apply 1 drop to eye as needed. Apply to both eyes as needed  . Vitamin D, Ergocalciferol, (DRISDOL) 50000 units CAPS capsule Take 1 capsule (50,000 Units total) by mouth every 7 (seven) days.  . vitamin E 400 UNIT capsule Take 400 Units by mouth daily.  . hyoscyamine (LEVSIN/SL) 0.125 MG SL tablet Place 1 tablet (0.125 mg total) under the tongue every 4 (four) hours as needed.  . Na Sulfate-K Sulfate-Mg Sulf 17.5-3.13-1.6 GM/180ML SOLN Take 1 kit by mouth once.  . naproxen (NAPROSYN) 500 MG tablet Take 1 tablet (500 mg total) by mouth 2 (two) times daily with a meal. (Patient not taking: Reported on 01/03/2016)   No facility-administered encounter medications on file as of 01/03/2016.   Allergies  Allergen Reactions  . Nitroglycerin Anaphylaxis    Hypotensive after NTG administration in ER during chest pain evaluation  . Pentosan  Polysulfate Sodium     Elevated LFTs.......Marland Kitchen elmiron   . Bactrim [Sulfamethoxazole-Trimethoprim]   . Promethazine Hcl     "jittery on the inside"  . Lisinopril Cough  . Sulfamethoxazole-Trimethoprim Nausea Only   Patient Active Problem List   Diagnosis Date Noted  . Ankylosing spondylitis ([place]) 11/08/2015  . Incontinence 10/28/2015  . Thoracic back pain 10/19/2015  . Muscle cramps 09/29/2015  . Prediabetes 09/29/2015  . History of arthroscopy of shoulder 07/06/2015  . Pain in shoulder 02/16/2015  . Impingement syndrome of left shoulder region 09/22/2014  . Interstitial cystitis  07/09/2014  . Elevated LFTs 07/09/2014  . Diverticulosis of colon without hemorrhage 07/08/2014  . S/P total hysterectomy and bilateral salpingo-oophorectomy 04/12/2014  . Ureteral calculus, right 04/12/2014  . Carpal tunnel syndrome 04/12/2014  . Food allergy 03/06/2014  . Renal calculi 01/30/2014  . OSA (obstructive sleep apnea), moderate 01/14/2014  . Subjective visual disturbance of both eyes 12/30/2013  . Visual changes 12/17/2013  . Elevated serum creatinine 12/16/2013  . Seasonal and perennial allergic rhinitis 12/06/2013  . Seasonal allergic conjunctivitis 12/06/2013  . Pneumonia, primary atypical 05/15/2013  . Chest pain 01/03/2013  . History of Bell's palsy 02/26/2012  . Vitamin D deficiency 06/27/2010  . 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  . DRY MOUTH 06/27/2010  . LOW BACK PAIN, CHRONIC 08/16/2009  . ABDOMINAL PAIN, SUPRAPUBIC 05/25/2009  . GERD 04/30/2009  . DIVERTICULOSIS, COLON 04/30/2009  . OSTEOPENIA 04/30/2009  . History of colonic polyps 04/30/2009  . COSTOCHONDRITIS 01/15/2008  . HYPERLIPIDEMIA 08/21/2007  . Essential hypertension 08/21/2007  . ARTHRALGIA 08/21/2007  . BACK PAIN WITH RADICULOPATHY 01/25/2007   Social History   Social History  . Marital Status: Married    Spouse Name: N/A  . Number of Children: 1  . Years of Education: BS   Occupational History  . retired   . teacher     Kindergarten   Social History Main Topics  . Smoking status: Never Smoker   . Smokeless tobacco: Never Used  . Alcohol Use: Yes     Comment: 1 glass of wine a years  . Drug Use: No  . Sexual Activity: Not Currently   Other Topics Concern  . Not on file   Social History Narrative   Married          Ms. Broom's family history includes Asthma in her mother; Cancer in her maternal aunt, paternal aunt, and sister; Cirrhosis in her father; Colon cancer (age of onset: 56) in her sister; Coronary artery disease in her maternal grandfather and mother; Dementia in her  mother; Diabetes in her father, paternal aunt, and paternal grandmother; Hypertension in her mother; Osteoporosis in her mother; Stroke in her mother.      Objective:    Filed Vitals:   01/03/16 1023  BP: 130/78  Pulse: 68    Physical Exam Well-developed older female in no acute distress, pleasant blood pressure 130/78 pulse 68 height 5 foot 1 weight 165, BMI 30. HEENT ;nontraumatic normocephalic EOMI PERRLA sclera anicteric, Cardiovascular; regular rate and rhythm with S1-S2 no murmur rub or gallop, Pulmonary ;clear bilaterally, Abdomen; soft, nontender there is no palpable mass or hepatosplenomegaly bowel sounds are present, Rectal ;exam not done, Extremities; no clubbing cyanosis or edema skin warm and dry, Neuropsych; mood and affect appropriate    Assessment & Plan:   #27 70 year old female with 4-6 week history of intermittent bright red blood per rectum possibly mixed with bowel movement and new lower abdominal pain preceding  bowel movements. Etiology is not clear, will need to rule out occult colon lesion #2 history of multiple adenomatous colon polyps-last colonoscopy December 2014 was recommended for 3 year interval follow-up #3 family history of colon cancer in patient's sister #4 mild diverticulosis #5 hypertension #6 ankylosing spondylitis  Plan; patient has Levsin sublingual at home but has not been using this regularly. She will try Levsin sublingual 3 times a day over the next week or so and if helpful will continue She will be scheduled for colonoscopy with Dr. Hilarie Fredrickson. Procedure discussed in detail with patient including risks and benefits and she is agreeable to proceed Check CBC today   Jurnei Latini S Laiya Wisby PA-C 01/03/2016   Cc: Binnie Rail, MD   Addendum: Reviewed and agree with initial management. Jerene Bears, MD

## 2016-01-03 NOTE — Patient Instructions (Addendum)
We sent a prescripton for Levsin LS. We also sent a prescription for Suprep.    You have been scheduled for a colonoscopy. Please follow written instructions given to you at your visit today.  Please pick up your prep supplies at the pharmacy within the next 1-3 days. If you use inhalers (even only as needed), please bring them with you on the day of your procedure. Your physician has requested that you go to www.startemmi.com and enter the access code given to you at your visit today. This web site gives a general overview about your procedure. However, you should still follow specific instructions given to you by our office regarding your preparation for the procedure.

## 2016-01-17 ENCOUNTER — Encounter: Payer: Self-pay | Admitting: Family Medicine

## 2016-01-17 ENCOUNTER — Ambulatory Visit (INDEPENDENT_AMBULATORY_CARE_PROVIDER_SITE_OTHER): Payer: Medicare Other | Admitting: Family Medicine

## 2016-01-17 VITALS — BP 136/82 | HR 58 | Ht 61.5 in | Wt 167.0 lb

## 2016-01-17 DIAGNOSIS — M129 Arthropathy, unspecified: Secondary | ICD-10-CM

## 2016-01-17 DIAGNOSIS — M19012 Primary osteoarthritis, left shoulder: Secondary | ICD-10-CM

## 2016-01-17 DIAGNOSIS — M459 Ankylosing spondylitis of unspecified sites in spine: Secondary | ICD-10-CM

## 2016-01-17 MED ORDER — DICLOFENAC SODIUM 2 % TD SOLN: 2.0000 "application " | Freq: Two times a day (BID) | TRANSDERMAL | Status: DC

## 2016-01-17 NOTE — Patient Instructions (Addendum)
Good to see you  I am sorry for the bad news  The shoulder is all arthritis and will be a balancing act.  ICe is your friend.  Tylenol scheduled 3 times a day no matter what After the colonoscopy take the meloxicam daily for 2 weeks then 3 times a week thereafter Also after colonoscopy continue the vitamins.  Refilled your vitamin D pennsaid pinkie amount topically 2 times daily as needed.  We can inject the shoulder but it will be temporary always You can look into PRP injection but do not know how much help it will be  I am here if you have questions and I would check in with me again in 2 months or so.

## 2016-01-17 NOTE — Progress Notes (Signed)
[name] [name], [place] [address] Phone: [phone] Subjective:    I'm seeing this patient by the request  of:  [name] Rail, MD   CC: Follow-up ankle since spondylitis  DQQ:[name] is a 70 y.o. female coming in with complaint of *back pain. Patient has had a history previously of ankylosing spondylitis. Positive HLA-B27. States that from time to time she feels like she has swelling in her back.  Patient was seen previously and was diagnosed with more of arthritic changes. We do not see any significant inflammation of the moment but with patient history of HLA-B27 anything patient has flares that are secondary to an ankylosing  spondylitis. Patient has been doing the over-the-counter medications, icing protocol, as well as doing some the exercises. Patient states since last time we seen her she's only had one day where she had an exacerbation where her back was severe enough that stops her from activities. States that this does not seem to be a constant thing. Did take the meloxicam regularly which was have been beneficial as well as a vitamin D supplementation.  Patient was doing 30-40% better. States that her back still seems to be doing relatively stable.  Completed more of her left shoulder. Has known arthritic changes in this left shoulder. Partial tearing of the rotator cuff as well. This was seen and independently visualized by me on an MRI in 2017. Patient does not want have shoulder replacement surgery. Concern could she continues to have limitation of range of motion. Would like to avoid another injection if possible.     Past Medical History  Diagnosis Date  . Hypertension   . Allergy     seasonal  . GERD (gastroesophageal reflux disease)   . Hyperlipidemia     NMR 2005  . Osteopenia     [facility]  . Hx of colonic polyps   . Diverticulosis of colon   . Interstitial cystitis   .  Vitamin D deficiency   . Xerostomia     and Xeroophthalmia  . Arthritis   . Dry eye syndrome   . Osteoarthritis   . Cystitis   . Incontinence   . Ankylosing spondylitis ([place])   . HLA B27 (HLA B27 positive)   . Frozen shoulder   . Bell palsy   . Anxiety   . Osteopenia   . Subjective visual disturbance of both eyes 12/30/2013  . Menopause   . Sleep apnea   . CTS (carpal tunnel syndrome)   . Hypothyroidism   . Kidney stone    Past Surgical History  Procedure Laterality Date  . Total abdominal hysterectomy w/ bilateral salpingoophorectomy      dysfunctional menses  . Bladder surgery      for incontinence  . G 1 p 1    . Colonscopy      tics 10-2002  . Interstitial cystitis      with clot in catheter  . Elevated lfts      due to Elmiron  . Colonoscopy w/ polypectomy      adenomatous poyp 04-2008  . Finger surgery Left 2004    2nd finger  . Cystoscopy with retrograde pyelogram, ureteroscopy and stent placement Left 09/01/2013    Procedure: CYSTOSCOPY WITH RETROGRADE PYELOGRAM, AND LEFT STENT PLACEMENT;  Surgeon: Molli Hazard, MD;  Location: [facility];  Service: Urology;  Laterality: Left;  . Abdominal hysterectomy    .  Carpal tunnel release Left 12/22/2014    Procedure: LEFT CARPAL TUNNEL RELEASE;  Surgeon: Daryll Brod, MD;  Location: [facility];  Service: Orthopedics;  Laterality: Left;  . Shoulder arthroscopy Left    Social History   Social History  . Marital Status: Married    Spouse Name: N/A  . Number of Children: 1  . Years of Education: BS   Occupational History  . retired   . teacher     Kindergarten   Social History Main Topics  . Smoking status: Never Smoker   . Smokeless tobacco: Never Used  . Alcohol Use: Yes     Comment: 1 glass of wine a years  . Drug Use: No  . Sexual Activity: Not Currently   Other Topics Concern  . None   Social History Narrative   Married         Allergies  Allergen Reactions  . Nitroglycerin  Anaphylaxis    Hypotensive after NTG administration in ER during chest pain evaluation  . Pentosan Polysulfate Sodium     Elevated LFTs.......[name] elmiron   . Bactrim [Sulfamethoxazole-Trimethoprim]   . Promethazine Hcl     "jittery on the inside"  . Lisinopril Cough  . Sulfamethoxazole-Trimethoprim Nausea Only   Family History  Problem Relation Age of Onset  . Coronary artery disease Mother   . Hypertension Mother   . Asthma Mother   . Osteoporosis Mother   . Dementia Mother   . Stroke Mother     mini cva  . Diabetes Father   . Cirrhosis Father     non alcoholic  . Cancer Sister     colon  . Colon cancer Sister 63  . Cancer Paternal Aunt     2 aunts had renal cancer  . Coronary artery disease Maternal Grandfather   . Cancer Maternal Aunt     breast  . Diabetes Paternal Aunt   . Diabetes Paternal Grandmother     Past medical history, social, surgical and family history all reviewed in electronic medical record.  No pertanent information unless stated regarding to the chief complaint.   Review of Systems: No headache, visual changes, nausea, vomiting, diarrhea, constipation, dizziness, abdominal pain, skin rash, fevers, chills, night sweats, weight loss, swollen lymph nodes, body aches, joint swelling, muscle aches, chest pain, shortness of breath, mood changes.   Objective Blood pressure 136/82, pulse 58, height 5' 1.5" (1.562 m), weight 167 lb (75.751 kg), SpO2 97 %.  General: No apparent distress alert and oriented x3 mood and affect normal, dressed appropriately.  HEENT: Pupils equal, extraocular movements intact  Respiratory: Patient's speak in full sentences and does not appear short of breath  Cardiovascular: No lower extremity edema, non tender, no erythema  Skin: Warm dry intact with no signs of infection or rash on extremities or on axial skeleton.  Abdomen: Soft nontender  Neuro: Cranial nerves II through XII are intact, neurovascularly intact in all extremities  with 2+ DTRs and 2+ pulses.  Lymph: No lymphadenopathy of posterior or anterior cervical chain or axillae bilaterally.  Gait normal with good balance and coordination.  MSK:  Non tender with full range of motion and good stability and symmetric strength and tone of  elbows, wrist, hip, knee and ankles bilaterally.  Mild arthritic changes of multiple joints. She is not as tender as she was previously. Shoulder: Left Inspection reveals atrophy of the left shoulder today. Does have some mild limitation in range of motion. Patient told he has  internal range of motion to the lateral hip, forward flexion of 150. Rotator cuff strength 3 out of 5 compared to the contralateral side Positive impingement on the left side with Neer's and Hawkins Speeds and Yergason's tests normal. Positive labral pathology. Weakness of scapula noted Positive painful arc Contralateral shoulder unremarkable    Back Exam:  Inspection: Unremarkable  Motion: Flexion 35 deg, Extension 15 deg, Side Bending to 35 deg bilaterally,  Rotation to 35 deg bilaterally  SLR laying: Negative  XSLR laying: Negative  Palpable tenderness: Continued mild tenderness of the paraspinal musculature of the lumbar spine FABER: bilateral. Sensory change: Gross sensation intact to all lumbar and sacral dermatomes.  Reflexes: 2+ at both patellar tendons, 2+ at achilles tendons, Babinski's downgoing.  Strength at foot  Plantar-flexion: 5/5 Dorsi-flexion: 5/5 Eversion: 5/5 Inversion: 5/5  Leg strength  Quad: 5/5 Hamstring: 5/5 Hip flexor: 5/5 Hip abductors: 3/5 no significant change in exam today on back    Impression and Recommendations:     This case required medical decision making of moderate complexity.      Note: This dictation was prepared with Dragon dictation along with smaller phrase technology. Any transcriptional errors that result from this process are unintentional.

## 2016-01-17 NOTE — Assessment & Plan Note (Signed)
Patient has fairly significant arthritic changes of the shoulder. We discussed with patient at great length. We discussed due to this I do not feel that any surgical intervention would be beneficial. Patient would need to have a shoulder replacement for the healing curative aspect of the pain but this would not help her functionality. We discussed can repeat injections which patient declined. We discussed continuing the home exercises and keep trying to work on the range of motion. Patient declined formal physical therapy at this moment. Patient will come back again if worsening symptoms occur that patient would like to have an injection in the shoulder.

## 2016-01-17 NOTE — Assessment & Plan Note (Signed)
Seems stable at this time. No significant change. We discussed with patient about seeing a rheumatologist more regular basis. Discussed possible different medications. Patient has had significant amount of arthritic changes that are likely osteo-but possibly some of the inflammatory as well. Patient is concerned about taking different medications a more regular basis. Does not want to be doing that on a regular basis. Patient will continue with some of the vitamin supplementations. Refilled vitamin D supplementation. Follow-up again with me again in 2 months.

## 2016-01-17 NOTE — Progress Notes (Signed)
Pre visit review using our clinic review tool, if applicable. No additional management support is needed unless otherwise documented below in the visit note. 

## 2016-01-27 ENCOUNTER — Encounter: Payer: Self-pay | Admitting: Internal Medicine

## 2016-01-27 ENCOUNTER — Ambulatory Visit (AMBULATORY_SURGERY_CENTER): Payer: Medicare Other | Admitting: Internal Medicine

## 2016-01-27 VITALS — BP 119/62 | HR 49 | Temp 99.1°F | Resp 13 | Ht 61.0 in | Wt 165.0 lb

## 2016-01-27 DIAGNOSIS — D125 Benign neoplasm of sigmoid colon: Secondary | ICD-10-CM

## 2016-01-27 DIAGNOSIS — D127 Benign neoplasm of rectosigmoid junction: Secondary | ICD-10-CM

## 2016-01-27 DIAGNOSIS — D123 Benign neoplasm of transverse colon: Secondary | ICD-10-CM | POA: Diagnosis not present

## 2016-01-27 DIAGNOSIS — I10 Essential (primary) hypertension: Secondary | ICD-10-CM | POA: Diagnosis not present

## 2016-01-27 DIAGNOSIS — R195 Other fecal abnormalities: Secondary | ICD-10-CM | POA: Diagnosis not present

## 2016-01-27 DIAGNOSIS — Z860100 Personal history of colon polyps, unspecified: Secondary | ICD-10-CM

## 2016-01-27 DIAGNOSIS — G4733 Obstructive sleep apnea (adult) (pediatric): Secondary | ICD-10-CM | POA: Diagnosis not present

## 2016-01-27 DIAGNOSIS — D122 Benign neoplasm of ascending colon: Secondary | ICD-10-CM

## 2016-01-27 DIAGNOSIS — Z800 Family history of malignant neoplasm of digestive organs: Secondary | ICD-10-CM | POA: Diagnosis not present

## 2016-01-27 DIAGNOSIS — K625 Hemorrhage of anus and rectum: Secondary | ICD-10-CM

## 2016-01-27 MED ORDER — SODIUM CHLORIDE 0.9 % IV SOLN: 500.0000 mL | INTRAVENOUS | Status: DC

## 2016-01-27 NOTE — Progress Notes (Signed)
Called to room to assist during endoscopic procedure.  Patient ID and intended procedure confirmed with present staff. Received instructions for my participation in the procedure from the performing physician.  

## 2016-01-27 NOTE — Op Note (Signed)
[name] Patient Name: [name] Procedure Date: 01/27/2016 2:06 PM MRN: [id number] Endoscopist: Jerene Bears , MD Age: 70 Referring MD:  Date of Birth: May 04, 1946 Gender: Female Account #: [id number] Procedure:                Colonoscopy Indications:              High risk colon cancer surveillance: Personal                            history of multiple (3 or more) adenomas, Last                            colonoscopy: December 2014, rectal bleeding Medicines:                Total IV Anesthesia (TIVA) Procedure:                Pre-Anesthesia Assessment:                           - Prior to the procedure, a History and Physical                            was performed, and patient medications and                            allergies were reviewed. The patient's tolerance of                            previous anesthesia was also reviewed. The risks                            and benefits of the procedure and the sedation                            options and risks were discussed with the patient.                            All questions were answered, and informed consent                            was obtained. Prior Anticoagulants: The patient has                            taken no previous anticoagulant or antiplatelet                            agents. ASA Grade Assessment: II - A patient with                            mild systemic disease. After reviewing the risks                            and benefits, the patient was deemed in  satisfactory condition to undergo the procedure.                           After obtaining informed consent, the colonoscope                            was passed under direct vision. Throughout the                            procedure, the patient's blood pressure, pulse, and                            oxygen saturations were monitored continuously. The                            Model PCF-H190L  [id number]) scope was introduced                            through the anus and advanced to the the cecum,                            identified by appendiceal orifice and ileocecal                            valve. The colonoscopy was performed without                            difficulty. The patient tolerated the procedure                            well. The quality of the bowel preparation was                            good. The ileocecal valve, appendiceal orifice, and                            rectum were photographed. Scope In: 2:18:58 PM Scope Out: 2:41:08 PM Scope Withdrawal Time: 0 hours 19 minutes 6 seconds  Total Procedure Duration: 0 hours 22 minutes 10 seconds  Findings:                 The perianal exam findings include skin tags.                           A 5 mm polyp was found in the ascending colon. The                            polyp was sessile. The polyp was removed with a                            cold snare. Resection and retrieval were complete.                           A 3 mm polyp was found in the  transverse colon. The                            polyp was sessile. The polyp was removed with a                            cold biopsy forceps. Resection and retrieval were                            complete.                           Two sessile polyps were found in the transverse                            colon. The polyps were 4 to 6 mm in size. These                            polyps were removed with a cold snare. Resection                            and retrieval were complete.                           Two sessile polyps were found in the sigmoid colon.                            The polyps were 4 to 6 mm in size. These polyps                            were removed with a cold snare. Resection and                            retrieval were complete.                           A 8 mm polyp was found in the recto-sigmoid colon.                             The polyp was sessile. The polyp was removed with a                            hot snare. Resection and retrieval were complete.                           Multiple diverticula were found in the sigmoid                            colon.                           Internal hemorrhoids were found during  retroflexion. The hemorrhoids were small. Complications:            No immediate complications. Estimated Blood Loss:     Estimated blood loss was minimal. Impression:               - One 5 mm polyp in the ascending colon, removed                            with a cold snare. Resected and retrieved.                           - One 3 mm polyp in the transverse colon, removed                            with a cold biopsy forceps. Resected and retrieved.                           - Two 4 to 6 mm polyps in the transverse colon,                            removed with a cold snare. Resected and retrieved.                           - Two 4 to 6 mm polyps in the sigmoid colon,                            removed with a cold snare. Resected and retrieved.                           - One 8 mm polyp at the recto-sigmoid colon,                            removed with a hot snare. Resected and retrieved.                           - Diverticulosis in the sigmoid colon.                           - Internal hemorrhoids. Recommendation:           - Patient has a contact number available for                            emergencies. The signs and symptoms of potential                            delayed complications were discussed with the                            patient. Return to normal activities tomorrow.                            Written discharge instructions were provided to the  patient.                           - Resume previous diet.                           - Continue present medications.                           - No aspirin, ibuprofen,  naproxen, or other                            non-steroidal anti-inflammatory drugs for 2 weeks                            after polyp removal.                           - Await pathology results.                           - Repeat colonoscopy is recommended for                            surveillance. The colonoscopy date will be                            determined after pathology results from today's                            exam become available for review. Jerene Bears, MD 01/27/2016 2:48:09 PM This report has been signed electronically.

## 2016-01-27 NOTE — Progress Notes (Signed)
Report to PACU, RN, vss, BBS= Clear.  

## 2016-01-27 NOTE — Patient Instructions (Signed)
YOU HAD AN ENDOSCOPIC PROCEDURE TODAY AT [facility]:   Refer to the procedure report that was given to you for any specific questions about what was found during the examination.  If the procedure report does not answer your questions, please call your gastroenterologist to clarify.  If you requested that your care partner not be given the details of your procedure findings, then the procedure report has been included in a sealed envelope for you to review at your convenience later.  YOU SHOULD EXPECT: Some feelings of bloating in the abdomen. Passage of more gas than usual.  Walking can help get rid of the air that was put into your GI tract during the procedure and reduce the bloating. If you had a lower endoscopy (such as a colonoscopy or flexible sigmoidoscopy) you may notice spotting of blood in your stool or on the toilet paper. If you underwent a bowel prep for your procedure, you may not have a normal bowel movement for a few days.  Please Note:  You might notice some irritation and congestion in your nose or some drainage.  This is from the oxygen used during your procedure.  There is no need for concern and it should clear up in a day or so.  SYMPTOMS TO REPORT IMMEDIATELY:   Following lower endoscopy (colonoscopy or flexible sigmoidoscopy):  Excessive amounts of blood in the stool  Significant tenderness or worsening of abdominal pains  Swelling of the abdomen that is new, acute  Fever of 100F or higher    For urgent or emergent issues, a gastroenterologist can be reached at any hour by calling [phone].   DIET: Your first meal following the procedure should be a small meal and then it is ok to progress to your normal diet. Heavy or fried foods are harder to digest and may make you feel nauseous or bloated.  Likewise, meals heavy in dairy and vegetables can increase bloating.  Drink plenty of fluids but you should avoid alcoholic beverages for 24  hours.  ACTIVITY:  You should plan to take it easy for the rest of today and you should NOT DRIVE or use heavy machinery until tomorrow (because of the sedation medicines used during the test).    FOLLOW UP: Our staff will call the number listed on your records the next business day following your procedure to check on you and address any questions or concerns that you may have regarding the information given to you following your procedure. If we do not reach you, we will leave a message.  However, if you are feeling well and you are not experiencing any problems, there is no need to return our call.  We will assume that you have returned to your regular daily activities without incident.  If any biopsies were taken you will be contacted by phone or by letter within the next 1-3 weeks.  Please call us at [phone] if you have not heard about the biopsies in 3 weeks.    SIGNATURES/CONFIDENTIALITY: You and/or your care partner have signed paperwork which will be entered into your electronic medical record.  These signatures attest to the fact that that the information above on your After Visit Summary has been reviewed and is understood.  Full responsibility of the confidentiality of this discharge information lies with you and/or your care-partner.   No Aspirin,ibuprofen,naproxen,or non-steroidal anti-inflammatory drugs for 2 weeks,resume remainder of medication. Information given on polyps,diverticulosis,hemorrhoids and high fiber diet.

## 2016-01-28 ENCOUNTER — Telehealth: Payer: Self-pay

## 2016-01-28 NOTE — Telephone Encounter (Signed)
  Follow up Call-  Call back number 01/27/2016 07/04/2013  Post procedure Call Back phone  # [phone]  Permission to leave phone message Yes Yes     Patient questions:  Do you have a fever, pain , or abdominal swelling? No. Pain Score  0 *  Have you tolerated food without any problems? Yes.    Have you been able to return to your normal activities? Yes.    Do you have any questions about your discharge instructions: Diet   No. Medications  No. Follow up visit  No.  Do you have questions or concerns about your Care? No.  Actions: * If pain score is 4 or above: No action needed, pain <4.

## 2016-02-04 ENCOUNTER — Encounter: Payer: Self-pay | Admitting: Internal Medicine

## 2016-02-11 ENCOUNTER — Encounter: Payer: Self-pay | Admitting: Family Medicine

## 2016-02-12 MED ORDER — VITAMIN D (ERGOCALCIFEROL) 1.25 MG (50000 UNIT) PO CAPS: 50000.0000 [IU] | ORAL_CAPSULE | ORAL | Status: DC

## 2016-02-14 DIAGNOSIS — H25013 Cortical age-related cataract, bilateral: Secondary | ICD-10-CM | POA: Diagnosis not present

## 2016-02-14 DIAGNOSIS — H2513 Age-related nuclear cataract, bilateral: Secondary | ICD-10-CM | POA: Diagnosis not present

## 2016-02-14 MED FILL — VIT D2 1.25 MG (50,000 UNIT: 1.25 MG | 84 days supply | Qty: 12 | Fill #0

## 2016-02-24 DIAGNOSIS — N3281 Overactive bladder: Secondary | ICD-10-CM | POA: Diagnosis not present

## 2016-02-24 DIAGNOSIS — N819 Female genital prolapse, unspecified: Secondary | ICD-10-CM | POA: Diagnosis not present

## 2016-02-24 DIAGNOSIS — Z1329 Encounter for screening for other suspected endocrine disorder: Secondary | ICD-10-CM | POA: Diagnosis not present

## 2016-02-24 DIAGNOSIS — Z01419 Encounter for gynecological examination (general) (routine) without abnormal findings: Secondary | ICD-10-CM | POA: Diagnosis not present

## 2016-02-24 DIAGNOSIS — Z6831 Body mass index (BMI) 31.0-31.9, adult: Secondary | ICD-10-CM | POA: Diagnosis not present

## 2016-02-24 DIAGNOSIS — N3945 Continuous leakage: Secondary | ICD-10-CM | POA: Diagnosis not present

## 2016-02-24 DIAGNOSIS — L639 Alopecia areata, unspecified: Secondary | ICD-10-CM | POA: Diagnosis not present

## 2016-02-24 MED FILL — SERTRALINE HCL 50 MG TABLET: 50 | 33 days supply | Qty: 30 | Fill #0

## 2016-02-29 DIAGNOSIS — G4733 Obstructive sleep apnea (adult) (pediatric): Secondary | ICD-10-CM | POA: Diagnosis not present

## 2016-03-12 NOTE — Progress Notes (Signed)
[name] [address] [place], [place] [address] Phone: [phone] Subjective:    I'm seeing this patient by the request  of:  Binnie Rail, MD   CC: Follow-up ankyloing spondylitis  PYP:[id number]  [name] is a 70 y.o. female coming in with complaint of back pain. Patient has had a history previously of ankylosing spondylitis. Positive HLA-B27.    Patient was seen previously and was diagnosed with more of arthritic changes. We do not see any significant inflammation of the moment but with patient history of HLA-B27 anything patient has flares that are secondary to an ankylosing  spondylitis. Patient has been doing the over-the-counter medications, icing protocol, as well as doing some the exercises.Paatient was doing 30% better at last exam.  Patient states still painful but not worsening.  Awaiting rheumatology.   Complained last time more of more of her left shoulder. Has known arthritic changes in this left shoulder. Partial tearing of the rotator cuff as well. This was seen and independently visualized by me on an MRI in 2017. Patient does not want have shoulder replacement surgery. Patient continues to have pain. An states that it only hurts her with certain movements. Patient can do daily activities.     Past Medical History:  Diagnosis Date  . Allergy    seasonal  . Ankylosing spondylitis ([place])   . Anxiety   . Arthritis   . Bell palsy   . CTS (carpal tunnel syndrome)   . Cystitis   . Diverticulosis of colon   . Dry eye syndrome   . Frozen shoulder   . GERD (gastroesophageal reflux disease)   . HLA B27 (HLA B27 positive)   . Hx of colonic polyps   . Hyperlipidemia    NMR 2005  . Hypertension   . Hypothyroidism   . Incontinence   . Interstitial cystitis   . Kidney stone   . Menopause   . Osteoarthritis   . Osteopenia    [facility] [address]  . Osteopenia   . Sleep apnea   . Subjective visual disturbance of  both eyes 12/30/2013  . Vitamin D deficiency   . Xerostomia    and Xeroophthalmia   Past Surgical History:  Procedure Laterality Date  . ABDOMINAL HYSTERECTOMY    . BLADDER SURGERY     for incontinence  . CARPAL TUNNEL RELEASE Left 12/22/2014   Procedure: LEFT CARPAL TUNNEL RELEASE;  Surgeon: Daryll Brod, MD;  Location: [facility];  Service: Orthopedics;  Laterality: Left;  . COLONOSCOPY W/ POLYPECTOMY     adenomatous poyp 04-2008  . colonscopy     tics 10-2002  . CYSTOSCOPY WITH RETROGRADE PYELOGRAM, URETEROSCOPY AND STENT PLACEMENT Left 09/01/2013   Procedure: CYSTOSCOPY WITH RETROGRADE PYELOGRAM, AND LEFT STENT PLACEMENT;  Surgeon: Molli Hazard, MD;  Location: [facility];  Service: Urology;  Laterality: Left;  . elevated LFTs     due to Elmiron  . FINGER SURGERY Left 2004   2nd finger  . G 1 P 1    . interstitial cystitis     with clot in catheter  . SHOULDER ARTHROSCOPY Left   . TOTAL ABDOMINAL HYSTERECTOMY W/ BILATERAL SALPINGOOPHORECTOMY     dysfunctional menses   Social History   Social History  . Marital status: Married    Spouse name: N/A  . Number of children: 1  . Years of education: BS   Occupational History  . retired   .  teacher     Kindergarten   Social History Main Topics  . Smoking status: Never Smoker  . Smokeless tobacco: Never Used  . Alcohol use Yes     Comment: 1 glass of wine a years  . Drug use: No  . Sexual activity: Not Currently   Other Topics Concern  . None   Social History Narrative   Married         Allergies  Allergen Reactions  . Nitroglycerin Anaphylaxis    Hypotensive after NTG administration in ER during chest pain evaluation  . Pentosan Polysulfate Sodium     Elevated LFTs.......Marland Kitchen elmiron   . Bactrim [Sulfamethoxazole-Trimethoprim]   . Promethazine Hcl     "jittery on the inside"  . Lisinopril Cough  . Sulfamethoxazole-Trimethoprim Nausea Only   Family History  Problem Relation Age of Onset  .  Coronary artery disease Mother   . Hypertension Mother   . Asthma Mother   . Osteoporosis Mother   . Dementia Mother   . Stroke Mother     mini cva  . Diabetes Father   . Cirrhosis Father     non alcoholic  . Cancer Sister     colon  . Colon cancer Sister 25  . Cancer Paternal Aunt     2 aunts had renal cancer  . Coronary artery disease Maternal Grandfather   . Cancer Maternal Aunt     breast  . Diabetes Paternal Aunt   . Diabetes Paternal Grandmother     Past medical history, social, surgical and family history all reviewed in electronic medical record.  No pertanent information unless stated regarding to the chief complaint.   Review of Systems: No headache, visual changes, nausea, vomiting, diarrhea, constipation, dizziness, abdominal pain, skin rash, fevers, chills, night sweats, weight loss, swollen lymph nodes, body aches, joint swelling, muscle aches, chest pain, shortness of breath, mood changes.   Objective  Blood pressure 118/80, pulse (!) 50, SpO2 98 %.  General: No apparent distress alert and oriented x3 mood and affect normal, dressed appropriately.  HEENT: Pupils equal, extraocular movements intact  Respiratory: Patient's speak in full sentences and does not appear short of breath  Cardiovascular: No lower extremity edema, non tender, no erythema  Skin: Warm dry intact with no signs of infection or rash on extremities or on axial skeleton.  Abdomen: Soft nontender  Neuro: Cranial nerves II through XII are intact, neurovascularly intact in all extremities with 2+ DTRs and 2+ pulses.  Lymph: No lymphadenopathy of posterior or anterior cervical chain or axillae bilaterally.  Gait normal with good balance and coordination.  MSK:  Non tender with full range of motion and good stability and symmetric strength and tone of  elbows, wrist, hip, knee and ankles bilaterally.  Mild arthritic changes of multiple joints. She is not as tender as she was previously. Shoulder:  Left Inspection reveals atrophy . Does have some mild limitation in range of motion. Patient told he has internal range of motion to the lateral hip, forward flexion of 155. Mild improvement overall.  Rotator cuff strength 3+ out of 5 compared to the contralateral side Positive impingement on the left side with Neer's and Hawkins Speeds and Yergason's tests normal. Positive labral pathology. Weakness of scapula noted Positive painful arc Contralateral shoulder unremarkable Mild improvement.   Back Exam:  Inspection: Unremarkable  Motion: Flexion 35 deg, Extension 15 deg, Side Bending to 35 deg bilaterally,  Rotation to 35 deg bilaterally  SLR laying: Negative  XSLR laying: Negative  Palpable tenderness: Continued mild tenderness of the paraspinal musculature of the lumbar spine FABER: bilateral. Sensory change: Gross sensation intact to all lumbar and sacral dermatomes.  Reflexes: 2+ at both patellar tendons, 2+ at achilles tendons, Babinski's downgoing.  Strength at foot  Plantar-flexion: 5/5 Dorsi-flexion: 5/5 Eversion: 5/5 Inversion: 5/5  Leg strength  Quad: 5/5 Hamstring: 5/5 Hip flexor: 5/5 Hip abductors: 3/5 no significant change in exam today on back.     Impression and Recommendations:     This case required medical decision making of moderate complexity.      Note: This dictation was prepared with Dragon dictation along with smaller phrase technology. Any transcriptional errors that result from this process are unintentional.

## 2016-03-13 ENCOUNTER — Encounter: Payer: Self-pay | Admitting: Family Medicine

## 2016-03-13 ENCOUNTER — Ambulatory Visit (INDEPENDENT_AMBULATORY_CARE_PROVIDER_SITE_OTHER): Payer: Medicare Other | Admitting: Family Medicine

## 2016-03-13 VITALS — BP 118/80 | HR 50

## 2016-03-13 DIAGNOSIS — M129 Arthropathy, unspecified: Secondary | ICD-10-CM

## 2016-03-13 DIAGNOSIS — M19012 Primary osteoarthritis, left shoulder: Secondary | ICD-10-CM

## 2016-03-13 DIAGNOSIS — M7542 Impingement syndrome of left shoulder: Secondary | ICD-10-CM

## 2016-03-13 DIAGNOSIS — E559 Vitamin D deficiency, unspecified: Secondary | ICD-10-CM | POA: Diagnosis not present

## 2016-03-13 DIAGNOSIS — M459 Ankylosing spondylitis of unspecified sites in spine: Secondary | ICD-10-CM

## 2016-03-13 NOTE — Assessment & Plan Note (Signed)
Continues to be difficult.  Patient continues to have pain. Patient does not want an injection at this point. We discussed that we can do this if worsening symptoms. Patient will be talking a rheumatology to see if the ankles spondylitis could be contribute in. Patient will come back and see me again on an as-needed basis.

## 2016-03-13 NOTE — Assessment & Plan Note (Signed)
Encourage patient to continue vitamin D supplementation. We will do once weekly, refill sent into day.

## 2016-03-13 NOTE — Assessment & Plan Note (Signed)
Discuss again at length. Patient does have a positive HLA-B27 from an outside facility. Has been sent to rheumatology. Patient will continue with the vitamin D supplementation. We discussed icing regimen. We discussed the meloxicam scheduled every 72 hours if needed. Patient come back and see me again on an as-needed basis otherwise.

## 2016-03-13 NOTE — Patient Instructions (Signed)
Great to see you  Ice 20 minutes 2 times daily. Usually after activity and before bed. Keep hands within peripheral vision.  We can always do an injection if needed.  Rheumatology will likely be helpful.  Stay active Malena Catholic at [place] [facility] See me again when you need me.

## 2016-03-15 DIAGNOSIS — N8111 Cystocele, midline: Secondary | ICD-10-CM | POA: Diagnosis not present

## 2016-03-22 DIAGNOSIS — R748 Abnormal levels of other serum enzymes: Secondary | ICD-10-CM | POA: Diagnosis not present

## 2016-03-22 DIAGNOSIS — R768 Other specified abnormal immunological findings in serum: Secondary | ICD-10-CM | POA: Diagnosis not present

## 2016-03-22 DIAGNOSIS — M545 Low back pain: Secondary | ICD-10-CM | POA: Diagnosis not present

## 2016-03-22 DIAGNOSIS — R5383 Other fatigue: Secondary | ICD-10-CM | POA: Diagnosis not present

## 2016-03-22 DIAGNOSIS — Z1589 Genetic susceptibility to other disease: Secondary | ICD-10-CM | POA: Diagnosis not present

## 2016-03-22 DIAGNOSIS — M25551 Pain in right hip: Secondary | ICD-10-CM | POA: Diagnosis not present

## 2016-03-27 ENCOUNTER — Other Ambulatory Visit: Payer: Self-pay | Admitting: Internal Medicine

## 2016-04-04 ENCOUNTER — Encounter: Payer: Self-pay | Admitting: Internal Medicine

## 2016-04-05 NOTE — Telephone Encounter (Signed)
Ok for her to drop off notes - will need a couple of days to review.   Make appt a 30 min appt if possible...Marland KitchenMarland Kitchen

## 2016-04-05 NOTE — Telephone Encounter (Signed)
Please advise,

## 2016-04-13 ENCOUNTER — Ambulatory Visit (INDEPENDENT_AMBULATORY_CARE_PROVIDER_SITE_OTHER): Payer: Medicare Other

## 2016-04-13 DIAGNOSIS — Z23 Encounter for immunization: Secondary | ICD-10-CM

## 2016-04-25 DIAGNOSIS — L03116 Cellulitis of left lower limb: Secondary | ICD-10-CM | POA: Diagnosis not present

## 2016-04-25 MED FILL — CEPHALEXIN 500 MG CAPSULE: 500 | 10 days supply | Qty: 30 | Fill #0

## 2016-05-02 ENCOUNTER — Other Ambulatory Visit: Payer: Self-pay | Admitting: Internal Medicine

## 2016-05-02 ENCOUNTER — Encounter: Payer: Self-pay | Admitting: Internal Medicine

## 2016-05-02 ENCOUNTER — Ambulatory Visit (INDEPENDENT_AMBULATORY_CARE_PROVIDER_SITE_OTHER): Payer: Medicare Other | Admitting: Internal Medicine

## 2016-05-02 ENCOUNTER — Other Ambulatory Visit (INDEPENDENT_AMBULATORY_CARE_PROVIDER_SITE_OTHER): Payer: Medicare Other

## 2016-05-02 VITALS — BP 134/86 | HR 56 | Temp 97.9°F | Resp 16 | Ht 61.0 in | Wt 167.0 lb

## 2016-05-02 DIAGNOSIS — M8580 Other specified disorders of bone density and structure, unspecified site: Secondary | ICD-10-CM | POA: Diagnosis not present

## 2016-05-02 DIAGNOSIS — R7303 Prediabetes: Secondary | ICD-10-CM

## 2016-05-02 DIAGNOSIS — M7542 Impingement syndrome of left shoulder: Secondary | ICD-10-CM

## 2016-05-02 DIAGNOSIS — E782 Mixed hyperlipidemia: Secondary | ICD-10-CM | POA: Diagnosis not present

## 2016-05-02 DIAGNOSIS — Z13820 Encounter for screening for osteoporosis: Secondary | ICD-10-CM

## 2016-05-02 DIAGNOSIS — I10 Essential (primary) hypertension: Secondary | ICD-10-CM

## 2016-05-02 DIAGNOSIS — Z0000 Encounter for general adult medical examination without abnormal findings: Secondary | ICD-10-CM

## 2016-05-02 DIAGNOSIS — Z780 Asymptomatic menopausal state: Secondary | ICD-10-CM

## 2016-05-02 DIAGNOSIS — G4733 Obstructive sleep apnea (adult) (pediatric): Secondary | ICD-10-CM

## 2016-05-02 LAB — CBC WITH DIFFERENTIAL/PLATELET
BASOS PCT: 0.5 % (ref 0.0–3.0)
Basophils Absolute: 0 10*3/uL (ref 0.0–0.1)
EOS PCT: 2.6 % (ref 0.0–5.0)
Eosinophils Absolute: 0.2 10*3/uL (ref 0.0–0.7)
HCT: 37.1 % (ref 36.0–46.0)
Hemoglobin: 12.8 g/dL (ref 12.0–15.0)
LYMPHS ABS: 1.6 10*3/uL (ref 0.7–4.0)
Lymphocytes Relative: 27.6 % (ref 12.0–46.0)
MCHC: 34.5 g/dL (ref 30.0–36.0)
MCV: 87.6 fl (ref 78.0–100.0)
MONO ABS: 0.5 10*3/uL (ref 0.1–1.0)
Monocytes Relative: 8 % (ref 3.0–12.0)
NEUTROS ABS: 3.5 10*3/uL (ref 1.4–7.7)
NEUTROS PCT: 61.3 % (ref 43.0–77.0)
Platelets: 265 10*3/uL (ref 150.0–400.0)
RBC: 4.24 Mil/uL (ref 3.87–5.11)
RDW: 12.6 % (ref 11.5–15.5)
WBC: 5.8 10*3/uL (ref 4.0–10.5)

## 2016-05-02 LAB — LIPID PANEL
Cholesterol: 193 mg/dL (ref 0–200)
HDL: 57.2 mg/dL (ref >39.00)
NONHDL: 135.38
TRIGLYCERIDES: 206 mg/dL — AB (ref 0.0–149.0)
Total CHOL/HDL Ratio: 3
VLDL: 41.2 mg/dL — AB (ref 0.0–40.0)

## 2016-05-02 LAB — COMPREHENSIVE METABOLIC PANEL
ALT: 26 U/L (ref 0–35)
AST: 26 U/L (ref 0–37)
Albumin: 4.2 g/dL (ref 3.5–5.2)
Alkaline Phosphatase: 78 U/L (ref 39–117)
BUN: 16 mg/dL (ref 6–23)
CHLORIDE: 103 meq/L (ref 96–112)
CO2: 27 mEq/L (ref 19–32)
Calcium: 9.5 mg/dL (ref 8.4–10.5)
Creatinine, Ser: 1 mg/dL (ref 0.40–1.20)
GFR: 57.89 mL/min — AB (ref >60.00)
GLUCOSE: 96 mg/dL (ref 70–99)
POTASSIUM: 4.2 meq/L (ref 3.5–5.1)
SODIUM: 140 meq/L (ref 135–145)
Total Bilirubin: 0.5 mg/dL (ref 0.2–1.2)
Total Protein: 7.5 g/dL (ref 6.0–8.3)

## 2016-05-02 LAB — LDL CHOLESTEROL, DIRECT: Direct LDL: 114 mg/dL

## 2016-05-02 LAB — HEMOGLOBIN A1C: HEMOGLOBIN A1C: 5.9 % (ref 4.6–6.5)

## 2016-05-02 LAB — TSH: TSH: 3.87 u[IU]/mL (ref 0.35–4.50)

## 2016-05-02 NOTE — Progress Notes (Signed)
Subjective:    Patient ID: [name], female    DOB: Aug 22, 1945, 70 y.o.   MRN: [id number]  HPI Here for medicare wellness exam.   I have personally reviewed and have noted 1.The patient's medical and social history 2.Their use of alcohol, tobacco or illicit drugs 3.Their current medications and supplements 4.The patient's functional ability including ADL's, fall risks, home safety              risks and hearing or visual impairment. 5.Diet and physical activities 6.Evidence for depression or mood disorders 7.Care team reviewed  - Rheumatology - Dr Trudie Reed, Gyn - Kennis Carina, eye - Dr Gershon Crane   Are there smokers in your home (other than you)? No  Risk Factors Exercise: occasionally walks, some yard work Dietary issues discussed: eats sweets, emotional eater - has a lot of stress  Cardiac risk factors: advanced age, hypertension, hyperlipidemia, and obesity (BMI >= 30 kg/m2).  Depression Screen  Have you felt down, depressed or hopeless? No  Have you felt little interest or pleasure in doing things?  No  Activities of Daily Living In your present state of health, do you have any difficulty performing the following activities?:  Driving? No Managing money?  No Feeding yourself? No Getting from bed to chair? No Climbing a flight of stairs? No Preparing food and eating?: No Bathing or showering? No Getting dressed: No Getting to/using the toilet? No Moving around from place to place: No In the past year have you fallen or had a near fall?: No   Are you sexually active?  No  Do you have more than one partner?  N/A  Hearing Difficulties:  Do you often ask people to speak up or repeat themselves? No Do you experience ringing or noises in your ears? No Do you have difficulty understanding soft or whispered voices? No Vision:              Any change in vision:  no             Up to date with eye exam:  yes  Memory:  Do you feel that you have a problem with memory? No  Do you often misplace items? No  Do you feel safe at home?  Yes  Cognitive Testing  Alert, Orientated? Yes  Normal Appearance? Yes  Recall of three objects?  Yes  Can perform simple calculations? Yes  Displays appropriate judgment? Yes  Can read the correct time from a watch face? Yes   Advanced Directives have been discussed with the patient? Yes   Medications and allergies reviewed with patient and updated if appropriate.  Patient Active Problem List   Diagnosis Date Noted  . Arthritis of left shoulder region 01/17/2016  . Ankylosing spondylitis ([place]) 11/08/2015  . Incontinence 10/28/2015  . Thoracic back pain 10/19/2015  . Muscle cramps 09/29/2015  . Prediabetes 09/29/2015  . History of arthroscopy of shoulder 07/06/2015  . Pain in shoulder 02/16/2015  . Impingement syndrome of left shoulder region 09/22/2014  . Interstitial cystitis 07/09/2014  . Elevated LFTs 07/09/2014  . Diverticulosis of colon without hemorrhage 07/08/2014  . S/P total hysterectomy and bilateral salpingo-oophorectomy 04/12/2014  . Ureteral calculus, right 04/12/2014  . Carpal tunnel syndrome 04/12/2014  . Food allergy 03/06/2014  . Renal calculi 01/30/2014  . OSA (obstructive sleep apnea), moderate 01/14/2014  . Subjective visual disturbance of both eyes 12/30/2013  . Visual changes 12/17/2013  . Elevated serum creatinine 12/16/2013  . Seasonal and  perennial allergic rhinitis 12/06/2013  . Seasonal allergic conjunctivitis 12/06/2013  . Pneumonia, primary atypical 05/15/2013  . Chest pain 01/03/2013  . History of Bell's palsy 02/26/2012  . Vitamin D deficiency 06/27/2010  . 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  . DRY MOUTH 06/27/2010  . LOW BACK PAIN, CHRONIC 08/16/2009  . ABDOMINAL PAIN, SUPRAPUBIC 05/25/2009  . GERD 04/30/2009  . DIVERTICULOSIS, COLON 04/30/2009  . OSTEOPENIA 04/30/2009  . History of colonic polyps 04/30/2009  .  COSTOCHONDRITIS 01/15/2008  . HYPERLIPIDEMIA 08/21/2007  . Essential hypertension 08/21/2007  . ARTHRALGIA 08/21/2007  . BACK PAIN WITH RADICULOPATHY 01/25/2007    Current Outpatient Prescriptions on File Prior to Visit  Medication Sig Dispense Refill  . AMBULATORY NON FORMULARY MEDICATION Medication Name: Tumeric 1000 mg BID    . AMBULATORY NON FORMULARY MEDICATION Medication Name: Cherry extract 1000 mg nightly    . aspirin 81 MG tablet Take 81 mg by mouth daily.      . blood glucose meter kit and supplies KIT Dispense based on patient and insurance preference. Use up to four times daily as directed. prediabetes 1 each 0  . cetirizine (ZYRTEC) 10 MG tablet Take 10 mg by mouth daily.      . Diclofenac Sodium ([name]) 2 % SOLN Place 2 application onto the skin 2 (two) times daily. 112 g 3  . hyoscyamine (LEVSIN/SL) 0.125 MG SL tablet Place 1 tablet (0.125 mg total) under the tongue every 4 (four) hours as needed. 90 tablet 0  . ibuprofen (ADVIL,MOTRIN) 800 MG tablet Take 800 mg by mouth.    . meloxicam (MOBIC) 15 MG tablet Take 1 tablet (15 mg total) by mouth daily. 90 tablet 2  . Misc Natural Products (COSAMIN ASU ADVANCED FORMULA PO) Take 2 each by mouth daily.    . Omega-3 Fatty Acids (FISH OIL BURP-LESS) 1000 MG CAPS Takes 1000 mg BID 60 capsule 0  . OSCIMIN 0.125 MG SL tablet     . Polyethyl Glycol-Propyl Glycol (SYSTANE OP) Apply 1 drop to eye as needed. Apply to both eyes as needed    . TOPROL XL 50 MG 24 hr tablet TAKE 1 TABLET DAILY. TAKE WITH OR IMMEDIATELY FOLLOWING A MEAL 90 tablet 3  . Vitamin D, Ergocalciferol, (DRISDOL) 50000 units CAPS capsule Take 1 capsule (50,000 Units total) by mouth every 7 (seven) days. 12 capsule 0  . vitamin E 400 UNIT capsule Take 400 Units by mouth daily.     No current facility-administered medications on file prior to visit.     Past Medical History:  Diagnosis Date  . Allergy    seasonal  . Ankylosing spondylitis ([place])   . Anxiety   .  Arthritis   . Bell palsy   . CTS (carpal tunnel syndrome)   . Cystitis   . Diverticulosis of colon   . Dry eye syndrome   . Frozen shoulder   . GERD (gastroesophageal reflux disease)   . HLA B27 (HLA B27 positive)   . Hx of colonic polyps   . Hyperlipidemia    NMR 2005  . Hypertension   . Hypothyroidism   . Incontinence   . Interstitial cystitis   . Kidney stone   . Menopause   . Osteoarthritis   . Osteopenia    [facility] [address]  . Osteopenia   . Sleep apnea   . Subjective visual disturbance of both eyes 12/30/2013  . Vitamin D deficiency   . Xerostomia    and  Xeroophthalmia    Past Surgical History:  Procedure Laterality Date  . ABDOMINAL HYSTERECTOMY    . BLADDER SURGERY     for incontinence  . CARPAL TUNNEL RELEASE Left 12/22/2014   Procedure: LEFT CARPAL TUNNEL RELEASE;  Surgeon: Daryll Brod, MD;  Location: [facility];  Service: Orthopedics;  Laterality: Left;  . COLONOSCOPY W/ POLYPECTOMY     adenomatous poyp 04-2008  . colonscopy     tics 10-2002  . CYSTOSCOPY WITH RETROGRADE PYELOGRAM, URETEROSCOPY AND STENT PLACEMENT Left 09/01/2013   Procedure: CYSTOSCOPY WITH RETROGRADE PYELOGRAM, AND LEFT STENT PLACEMENT;  Surgeon: Molli Hazard, MD;  Location[facility];  Service: Urology;  Laterality: Left;  . elevated LFTs     due to Elmiron  . FINGER SURGERY Left 2004   2nd finger  . G 1 P 1    . interstitial cystitis     with clot in catheter  . SHOULDER ARTHROSCOPY Left   . TOTAL ABDOMINAL HYSTERECTOMY W/ BILATERAL SALPINGOOPHORECTOMY     dysfunctional menses    Social History   Social History  . Marital status: Married    Spouse name: N/A  . Number of children: 1  . Years of education: BS   Occupational History  . retired   . teacher     Kindergarten   Social History Main Topics  . Smoking status: Never Smoker  . Smokeless tobacco: Never Used  . Alcohol use Yes     Comment: 1 glass of wine a years  . Drug use: No    . Sexual activity: Not Currently   Other Topics Concern  . Not on file   Social History Narrative   Married          Family History  Problem Relation Age of Onset  . Coronary artery disease Mother   . Hypertension Mother   . Asthma Mother   . Osteoporosis Mother   . Dementia Mother   . Stroke Mother     mini cva  . Diabetes Father   . Cirrhosis Father     non alcoholic  . Cancer Sister     colon  . Colon cancer Sister 61  . Cancer Paternal Aunt     2 aunts had renal cancer  . Coronary artery disease Maternal Grandfather   . Cancer Maternal Aunt     breast  . Diabetes Paternal Aunt   . Diabetes Paternal Grandmother     Review of Systems  Constitutional: Positive for diaphoresis (intermittent sweating). Negative for chills and fever.  HENT: Negative for hearing loss and tinnitus.   Eyes: Negative for visual disturbance.  Respiratory: Negative for cough, shortness of breath and wheezing.   Cardiovascular: Positive for chest pain (last week with palpation on left side, one episode of sharp chest pain that lasted less than minute), palpitations (occ in throat) and leg swelling (occ).  Gastrointestinal: Positive for anal bleeding (hemorrhoidal), constipation, diarrhea (bowels alternate) and nausea (occ after eating). Negative for abdominal pain and blood in stool.       GERD occ  Genitourinary: Negative for dysuria and hematuria.       Incontnence  Musculoskeletal: Positive for arthralgias.  Neurological: Positive for light-headedness. Negative for headaches.  Psychiatric/Behavioral: Positive for dysphoric mood (low level). The patient is nervous/anxious (low level).        Objective:   Vitals:   05/02/16 0804  BP: 134/86  Pulse: (!) 56  Resp: 16  Temp: 97.9 F (36.6 C)  Filed Weights   05/02/16 0804  Weight: 167 lb (75.8 kg)   Body mass index is 31.55 kg/m.   Physical Exam Constitutional: She appears well-developed and well-nourished. No distress.   HENT:  Head: Normocephalic and atraumatic.  Right Ear: External ear normal. Normal ear canal and TM Left Ear: External ear normal.  Normal ear canal and TM Mouth/Throat: Oropharynx is clear and moist.  Eyes: Conjunctivae and EOM are normal.  Neck: Neck supple. No tracheal deviation present. No thyromegaly present.  No carotid bruit  Cardiovascular: Normal rate, regular rhythm and normal heart sounds.   No murmur heard.  No edema. Pulmonary/Chest: Effort normal and breath sounds normal. No respiratory distress. She has no wheezes. She has no rales.  Breast: deferred to Gyn Abdominal: Soft. She exhibits no distension. There is no tenderness.  Lymphadenopathy: She has no cervical adenopathy.  Skin: Skin is warm and dry. She is not diaphoretic.  Psychiatric: She has a normal mood and affect. Her behavior is normal.         Assessment & Plan:   Wellness Exam: Immunizations Up to date  Colonoscopy  Up to date  Mammogram  Up to date  Dexa - will order dexa Gyn Up to date  Eye exam  Up to date  Hearing loss   none Memory concerns/difficulties  none Independent of ADLs --  fully Stressed the importance of regular exercise   Patient received copy of preventative screening tests/immunizations recommended for the next 5-10 years.   See Problem List for Assessment and Plan of chronic medical problems.   Follow up in 6 months

## 2016-05-02 NOTE — Patient Instructions (Addendum)
  [name] , Thank you for taking time to come for your Medicare Wellness Visit. I appreciate your ongoing commitment to your health goals. Please review the following plan we discussed and let me know if I can assist you in the future.   These are the goals we discussed: Goals    Start regular exercise, work on weight loss      This is a list of the screening recommended for you and due dates:  Health Maintenance  Topic Date Due  . DEXA scan (bone density measurement)  04/28/2016  . Mammogram  08/11/2017  . Colon Cancer Screening  01/27/2019  . Tetanus Vaccine  06/27/2020  . Flu Shot  Completed  . Shingles Vaccine  Completed  .  Hepatitis C: One time screening is recommended by Center for Disease Control  (CDC) for  adults born from [birth year] through 1967.   Completed  . Pneumonia vaccines  Completed     Test(s) ordered today. Your results will be released to [facility] (or called to you) after review, usually within 72hours after test completion. If any changes need to be made, you will be notified at that same time.  All other Health Maintenance issues reviewed.   All recommended immunizations and age-appropriate screenings are up-to-date or discussed.  No immunizations administered today.   Medications reviewed and updated.  No changes recommended at this time.  Your prescription(s) have been submitted to your pharmacy. Please take as directed and contact our office if you believe you are having problem(s) with the medication(s)  A dexa was ordered.    Please followup in 6 months

## 2016-05-02 NOTE — Assessment & Plan Note (Signed)
Uses CPAP most nights

## 2016-05-02 NOTE — Assessment & Plan Note (Signed)
BP well controlled Current regimen effective and well tolerated Continue current medications at current doses Basic labs

## 2016-05-02 NOTE — Progress Notes (Signed)
Pre visit review using our clinic review tool, if applicable. No additional management support is needed unless otherwise documented below in the visit note. 

## 2016-05-02 NOTE — Assessment & Plan Note (Addendum)
Chronic pain and dec ROM Will try PT - she will let me know where she wants to go

## 2016-05-02 NOTE — Assessment & Plan Note (Signed)
Check a1c Stressed regular exercise and decreased sweets

## 2016-05-02 NOTE — Assessment & Plan Note (Signed)
Check lipids 

## 2016-05-03 ENCOUNTER — Other Ambulatory Visit: Payer: Self-pay | Admitting: Emergency Medicine

## 2016-05-03 MED ORDER — DICLOFENAC SODIUM 2 % TD SOLN
2.0000 | Freq: Two times a day (BID) | TRANSDERMAL | 3 refills | Status: DC
Start: 2016-05-03 — End: 2016-05-16

## 2016-05-07 ENCOUNTER — Encounter: Payer: Self-pay | Admitting: Internal Medicine

## 2016-05-16 ENCOUNTER — Ambulatory Visit (INDEPENDENT_AMBULATORY_CARE_PROVIDER_SITE_OTHER): Payer: Medicare Other | Admitting: Internal Medicine

## 2016-05-16 ENCOUNTER — Encounter: Payer: Self-pay | Admitting: Internal Medicine

## 2016-05-16 VITALS — BP 154/78 | HR 71 | Temp 98.1°F | Resp 16 | Wt 170.0 lb

## 2016-05-16 DIAGNOSIS — I10 Essential (primary) hypertension: Secondary | ICD-10-CM | POA: Diagnosis not present

## 2016-05-16 DIAGNOSIS — R42 Dizziness and giddiness: Secondary | ICD-10-CM

## 2016-05-16 MED ORDER — DICLOFENAC SODIUM 2 % TD SOLN: 2.0000 "application " | Freq: Two times a day (BID) | TRANSDERMAL | 3 refills | Status: DC

## 2016-05-16 NOTE — Assessment & Plan Note (Signed)
BP elevated here today Increase Toprol back to 50 mg daily Advised her to monitor her BP and HR at home and let me know her readings.

## 2016-05-16 NOTE — Patient Instructions (Addendum)
  Medications reviewed and updated.  Changes include increasing the toprol back up to 50 mg daily.   Monitor your blood pressure and heart rate at home and let me know your numbers.

## 2016-05-16 NOTE — Progress Notes (Signed)
Pre visit review using our clinic review tool, if applicable. No additional management support is needed unless otherwise documented below in the visit note. 

## 2016-05-16 NOTE — Assessment & Plan Note (Signed)
Resolved No evidence of BPPV No neurological deficits and symptoms resolved BP and HR normal when symptomatic - does not seem to be cardiac in nature ? Migraine monitor for now - if symptoms recur will refer for further evaluation

## 2016-05-16 NOTE — Progress Notes (Signed)
Subjective:    Patient ID: [name], female    DOB: 06-14-1946, 70 y.o.   MRN: [id number]  HPI She is here for an acute visit.   Two weeks ago she had another episode of lightheadedness and dizziness.  It felt like her body was spinning.  It lasted the whole day.  It persisted despite sitting, standing or laying down.  It was not influenced by head movements.    She denied any chest pain at the time.  She has occasional fluttering in her throat, but did not have that at the time.  She had a transient chest pressure, but no pain.  She denies headaches, SOB or headache at that time.  She woke up the next morning and was ok. She has had some intermittent dizziness since, but nothing that lasted that long.    Her daughter checked her BP and HR on the day of the severe dizziness/lightheadedness and it was normal.       She has a history of occular migraines. She had an MRI of her brain last year.  Two years ago she had an MRA of her brain and carotid US.   She feels fine today and has no complaints.   She does not drink enough fluids and is under a lot of stress related to her husbands health problems.   She was concerned some of her symptoms may have been related to her blood pressure medication-the Toprol. She started taking half pill daily. She has not checked her blood pressure over the last few days.  Medications and allergies reviewed with patient and updated if appropriate.  Patient Active Problem List   Diagnosis Date Noted  . Arthritis of left shoulder region 01/17/2016  . Ankylosing spondylitis ([place]) 11/08/2015  . Incontinence 10/28/2015  . Thoracic back pain 10/19/2015  . Muscle cramps 09/29/2015  . Prediabetes 09/29/2015  . History of arthroscopy of shoulder 07/06/2015  . Impingement syndrome of left shoulder region 09/22/2014  . Interstitial cystitis 07/09/2014  . Diverticulosis of colon without hemorrhage 07/08/2014  . S/P total hysterectomy and bilateral  salpingo-oophorectomy 04/12/2014  . Ureteral calculus, right 04/12/2014  . Carpal tunnel syndrome 04/12/2014  . Food allergy 03/06/2014  . Renal calculi 01/30/2014  . OSA (obstructive sleep apnea), moderate 01/14/2014  . Subjective visual disturbance of both eyes 12/30/2013  . Visual changes 12/17/2013  . Elevated serum creatinine 12/16/2013  . Seasonal and perennial allergic rhinitis 12/06/2013  . Seasonal allergic conjunctivitis 12/06/2013  . Pneumonia, primary atypical 05/15/2013  . History of Bell's palsy 02/26/2012  . Vitamin D deficiency 06/27/2010  . 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  . DRY MOUTH 06/27/2010  . LOW BACK PAIN, CHRONIC 08/16/2009  . ABDOMINAL PAIN, SUPRAPUBIC 05/25/2009  . GERD 04/30/2009  . DIVERTICULOSIS, COLON 04/30/2009  . Osteopenia 04/30/2009  . History of colonic polyps 04/30/2009  . COSTOCHONDRITIS 01/15/2008  . HYPERLIPIDEMIA 08/21/2007  . Essential hypertension 08/21/2007  . ARTHRALGIA 08/21/2007  . BACK PAIN WITH RADICULOPATHY 01/25/2007    Current Outpatient Prescriptions on File Prior to Visit  Medication Sig Dispense Refill  . aspirin 81 MG tablet Take 81 mg by mouth daily.      . blood glucose meter kit and supplies KIT Dispense based on patient and insurance preference. Use up to four times daily as directed. prediabetes 1 each 0  . cetirizine (ZYRTEC) 10 MG tablet Take 10 mg by mouth daily.      Marland Kitchen ibuprofen (ADVIL,MOTRIN) 800 MG tablet  Take 800 mg by mouth.    . meloxicam (MOBIC) 15 MG tablet Take 1 tablet (15 mg total) by mouth daily. 90 tablet 2  . Misc Natural Products (COSAMIN ASU ADVANCED FORMULA PO) Take 2 each by mouth daily.    . Omega-3 Fatty Acids (FISH OIL BURP-LESS) 1000 MG CAPS Takes 1000 mg BID 60 capsule 0  . OSCIMIN 0.125 MG SL tablet     . Polyethyl Glycol-Propyl Glycol (SYSTANE OP) Apply 1 drop to eye as needed. Apply to both eyes as needed    . TOPROL XL 50 MG 24 hr tablet TAKE 1 TABLET DAILY. TAKE WITH OR IMMEDIATELY  FOLLOWING A MEAL 90 tablet 3  . vitamin E 400 UNIT capsule Take 400 Units by mouth daily.     No current facility-administered medications on file prior to visit.     Past Medical History:  Diagnosis Date  . Allergy    seasonal  . Ankylosing spondylitis ([place])   . Anxiety   . Arthritis   . Bell palsy   . CTS (carpal tunnel syndrome)   . Cystitis   . Diverticulosis of colon   . Dry eye syndrome   . Frozen shoulder   . GERD (gastroesophageal reflux disease)   . HLA B27 (HLA B27 positive)   . Hx of colonic polyps   . Hyperlipidemia    NMR 2005  . Hypertension   . Hypothyroidism   . Incontinence   . Interstitial cystitis   . Kidney stone   . Menopause   . Osteoarthritis   . Osteopenia    [facility] [address]  . Osteopenia   . Sleep apnea   . Subjective visual disturbance of both eyes 12/30/2013  . Vitamin D deficiency   . Xerostomia    and Xeroophthalmia    Past Surgical History:  Procedure Laterality Date  . ABDOMINAL HYSTERECTOMY    . BLADDER SURGERY     for incontinence  . CARPAL TUNNEL RELEASE Left 12/22/2014   Procedure: LEFT CARPAL TUNNEL RELEASE;  Surgeon: Daryll Brod, MD;  Location: [facility];  Service: Orthopedics;  Laterality: Left;  . COLONOSCOPY W/ POLYPECTOMY     adenomatous poyp 04-2008  . colonscopy     tics 10-2002  . CYSTOSCOPY WITH RETROGRADE PYELOGRAM, URETEROSCOPY AND STENT PLACEMENT Left 09/01/2013   Procedure: CYSTOSCOPY WITH RETROGRADE PYELOGRAM, AND LEFT STENT PLACEMENT;  Surgeon: Molli Hazard, MD;  Location: [facility];  Service: Urology;  Laterality: Left;  . elevated LFTs     due to Elmiron  . FINGER SURGERY Left 2004   2nd finger  . G 1 P 1    . interstitial cystitis     with clot in catheter  . SHOULDER ARTHROSCOPY Left   . TOTAL ABDOMINAL HYSTERECTOMY W/ BILATERAL SALPINGOOPHORECTOMY     dysfunctional menses    Social History   Social History  . Marital status: Married    Spouse name: N/A  .  Number of children: 1  . Years of education: BS   Occupational History  . retired   . teacher     Kindergarten   Social History Main Topics  . Smoking status: Never Smoker  . Smokeless tobacco: Never Used  . Alcohol use Yes     Comment: 1 glass of wine a years  . Drug use: No  . Sexual activity: Not Currently   Other Topics Concern  . Not on file   Social History Narrative  Married          Family History  Problem Relation Age of Onset  . Coronary artery disease Mother   . Hypertension Mother   . Asthma Mother   . Osteoporosis Mother   . Dementia Mother   . Stroke Mother     mini cva  . Diabetes Father   . Cirrhosis Father     non alcoholic  . Cancer Sister     colon  . Colon cancer Sister 59  . Cancer Paternal Aunt     2 aunts had renal cancer  . Coronary artery disease Maternal Grandfather   . Cancer Maternal Aunt     breast  . Diabetes Paternal Aunt   . Diabetes Paternal Grandmother     Review of Systems  Constitutional: Negative for fever.  HENT: Positive for sinus pressure (allergy related). Negative for congestion, ear pain and sore throat.   Eyes: Negative for visual disturbance.  Respiratory: Positive for cough (allergy related).   Cardiovascular: Positive for palpitations (occasional) and leg swelling (mild). Negative for chest pain.  Neurological: Positive for dizziness and light-headedness. Negative for weakness (no new weakness), numbness and headaches.       Objective:   Vitals:   05/16/16 1621  BP: (!) 174/82  Pulse: 71  Resp: 16  Temp: 98.1 F (36.7 C)   Filed Weights   05/16/16 1621  Weight: 170 lb (77.1 kg)   Body mass index is 32.12 kg/m.   Physical Exam  Constitutional: She is oriented to person, place, and time. She appears well-developed and well-nourished. No distress.  HENT:  Head: Normocephalic and atraumatic.  Right Ear: External ear normal.  Left Ear: External ear normal.  Mouth/Throat: Oropharynx is clear  and moist.  Eyes: Conjunctivae and EOM are normal.  Neck: Neck supple. No tracheal deviation present. No thyromegaly present.  Cardiovascular: Normal rate, regular rhythm and normal heart sounds.   No murmur heard. Pulmonary/Chest: Effort normal and breath sounds normal. No respiratory distress. She has no wheezes. She has no rales.  Musculoskeletal: She exhibits no edema.  Lymphadenopathy:    She has no cervical adenopathy.  Neurological: She is alert and oriented to person, place, and time. No cranial nerve deficit.  Normal sensation and strength all extremities  Skin: Skin is warm and dry. She is not diaphoretic.  Psychiatric: She has a normal mood and affect. Her behavior is normal.          Assessment & Plan:   See Problem List for Assessment and Plan of chronic medical problems.

## 2016-05-17 ENCOUNTER — Telehealth: Payer: Self-pay | Admitting: Emergency Medicine

## 2016-05-17 NOTE — Telephone Encounter (Signed)
Pt called and asked that you call her back about the medication/samples she was suppose to [facility] about. Please follow up thanks.

## 2016-05-17 NOTE — Telephone Encounter (Signed)
LVM for pt to call back.

## 2016-05-17 NOTE — Telephone Encounter (Signed)
Spoke with pt

## 2016-06-03 ENCOUNTER — Other Ambulatory Visit: Payer: Self-pay | Admitting: Internal Medicine

## 2016-06-03 MED ORDER — DICLOFENAC SODIUM 2 % TD SOLN: 2.0000 "application " | Freq: Two times a day (BID) | TRANSDERMAL | 3 refills | Status: DC

## 2016-06-08 ENCOUNTER — Telehealth: Payer: Self-pay | Admitting: *Deleted

## 2016-06-08 NOTE — Telephone Encounter (Signed)
Left msg on triage stating needing clinical information on [name] that was sent to [facility]...[name]

## 2016-06-08 NOTE — Telephone Encounter (Signed)
Clinical information faxed to [place] - 12/2015 OV with Dr Tamala Julian

## 2016-06-20 ENCOUNTER — Ambulatory Visit
Admission: RE | Admit: 2016-06-20 | Discharge: 2016-06-20 | Disposition: A | Discharge status: Home / Self Care | Payer: Medicare Other | Source: Ambulatory Visit | Attending: Internal Medicine | Admitting: Internal Medicine

## 2016-06-20 DIAGNOSIS — Z780 Asymptomatic menopausal state: Secondary | ICD-10-CM | POA: Diagnosis not present

## 2016-06-20 DIAGNOSIS — M810 Age-related osteoporosis without current pathological fracture: Secondary | ICD-10-CM | POA: Diagnosis not present

## 2016-06-20 DIAGNOSIS — Z13820 Encounter for screening for osteoporosis: Secondary | ICD-10-CM

## 2016-06-20 DIAGNOSIS — M8580 Other specified disorders of bone density and structure, unspecified site: Secondary | ICD-10-CM

## 2016-06-21 ENCOUNTER — Encounter: Payer: Self-pay | Admitting: Internal Medicine

## 2016-06-21 ENCOUNTER — Telehealth: Payer: Self-pay | Admitting: Internal Medicine

## 2016-06-21 DIAGNOSIS — M810 Age-related osteoporosis without current pathological fracture: Secondary | ICD-10-CM | POA: Insufficient documentation

## 2016-06-26 ENCOUNTER — Telehealth: Payer: Self-pay | Admitting: Internal Medicine

## 2016-06-26 NOTE — Telephone Encounter (Signed)
Ok to wait until first available or can wait if she really wants to until her next appt.

## 2016-06-26 NOTE — Telephone Encounter (Signed)
Are you okay with fitting pt in or is it okay to wait for pt to come in first available.

## 2016-06-26 NOTE — Telephone Encounter (Signed)
Patient states she called last week and someone to get in touch with you about scheduling her a follow up with Dr. Quay Burow on her diagnosis of osteoporosis.  Did not see a phone note with this information.  Can you please follow up with getting a time for patient to come in.

## 2016-06-26 NOTE — Telephone Encounter (Signed)
Tammy, will you please call pt to schedule first available for Bone Density follow-up

## 2016-06-27 NOTE — Telephone Encounter (Signed)
I made her appointment. She called again.

## 2016-06-28 ENCOUNTER — Encounter: Payer: Self-pay | Admitting: Internal Medicine

## 2016-06-28 ENCOUNTER — Ambulatory Visit (INDEPENDENT_AMBULATORY_CARE_PROVIDER_SITE_OTHER): Payer: Medicare Other | Admitting: Internal Medicine

## 2016-06-28 VITALS — BP 136/82 | HR 59 | Temp 98.1°F | Resp 16 | Ht 61.0 in | Wt 172.0 lb

## 2016-06-28 DIAGNOSIS — E559 Vitamin D deficiency, unspecified: Secondary | ICD-10-CM

## 2016-06-28 DIAGNOSIS — R42 Dizziness and giddiness: Secondary | ICD-10-CM

## 2016-06-28 DIAGNOSIS — G5602 Carpal tunnel syndrome, left upper limb: Secondary | ICD-10-CM | POA: Diagnosis not present

## 2016-06-28 DIAGNOSIS — M810 Age-related osteoporosis without current pathological fracture: Secondary | ICD-10-CM

## 2016-06-28 DIAGNOSIS — R1011 Right upper quadrant pain: Secondary | ICD-10-CM

## 2016-06-28 NOTE — Progress Notes (Signed)
Subjective:    Patient ID: [name], female    DOB: [birth year], 70 y.o.   MRN: [id number]  HPI She is here for follow up of her recent diagnosis of osteoporosis.  Osteoporosis:  She has osteoporosis in her Left Forearm, 3.9%, and Left Femoral Neck, -2.2%.  There was significant decline in her bone density since two years ago.  She is taking vitamin D daily and some calcium.  She is not currently exercising.  She was on boniva in the past but is unsure for how long.  She denies a history of pathological fractures.   Dizziness: She has intermittent dizziness, which is not new.  It is mild, but still there.   Mild renal insufficiency: She is concerned about her kidney function, which we have discussed in the past.    RUQ pain:  She has seen an occasional "bump" and had discomfort in the RUQ.  This has occurred a few times and she has noticed it in the evening after dinner.  She denies true pain.  She denies GERD, nausea, association with food or bowel movements.   Medications and allergies reviewed with patient and updated if appropriate.  Patient Active Problem List   Diagnosis Date Noted  . Osteoporosis 06/21/2016  . Dizziness 05/16/2016  . Arthritis of left shoulder region 01/17/2016  . Ankylosing spondylitis ([place]) 11/08/2015  . Incontinence 10/28/2015  . Thoracic back pain 10/19/2015  . Muscle cramps 09/29/2015  . Prediabetes 09/29/2015  . History of arthroscopy of shoulder 07/06/2015  . Impingement syndrome of left shoulder region 09/22/2014  . Interstitial cystitis 07/09/2014  . Diverticulosis of colon without hemorrhage 07/08/2014  . S/P total hysterectomy and bilateral salpingo-oophorectomy 04/12/2014  . Ureteral calculus, right 04/12/2014  . Carpal tunnel syndrome 04/12/2014  . Food allergy 03/06/2014  . Renal calculi 01/30/2014  . OSA (obstructive sleep apnea), moderate 01/14/2014  . Subjective visual disturbance of both eyes 12/30/2013  . Visual changes  12/17/2013  . Elevated serum creatinine 12/16/2013  . Seasonal and perennial allergic rhinitis 12/06/2013  . Seasonal allergic conjunctivitis 12/06/2013  . Pneumonia, primary atypical 05/15/2013  . History of Bell's palsy 02/26/2012  . Vitamin D deficiency 06/27/2010  . 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  . DRY MOUTH 06/27/2010  . LOW BACK PAIN, CHRONIC 08/16/2009  . ABDOMINAL PAIN, SUPRAPUBIC 05/25/2009  . GERD 04/30/2009  . DIVERTICULOSIS, COLON 04/30/2009  . Osteopenia 04/30/2009  . History of colonic polyps 04/30/2009  . COSTOCHONDRITIS 01/15/2008  . HYPERLIPIDEMIA 08/21/2007  . Essential hypertension 08/21/2007  . ARTHRALGIA 08/21/2007  . BACK PAIN WITH RADICULOPATHY 01/25/2007    Current Outpatient Prescriptions on File Prior to Visit  Medication Sig Dispense Refill  . aspirin 81 MG tablet Take 81 mg by mouth daily.      . blood glucose meter kit and supplies KIT Dispense based on patient and insurance preference. Use up to four times daily as directed. prediabetes 1 each 0  . cetirizine (ZYRTEC) 10 MG tablet Take 10 mg by mouth daily.      . Diclofenac Sodium ([name]) 2 % SOLN Place 2 application onto the skin 2 (two) times daily. 112 g 3  . ibuprofen (ADVIL,MOTRIN) 800 MG tablet Take 800 mg by mouth.    . meloxicam (MOBIC) 15 MG tablet Take 1 tablet (15 mg total) by mouth daily. 90 tablet 2  . Misc Natural Products (COSAMIN ASU ADVANCED FORMULA PO) Take 2 each by mouth daily.    . Omega-3 Fatty Acids (  FISH OIL BURP-LESS) 1000 MG CAPS Takes 1000 mg BID 60 capsule 0  . OSCIMIN 0.125 MG SL tablet     . Polyethyl Glycol-Propyl Glycol (SYSTANE OP) Apply 1 drop to eye as needed. Apply to both eyes as needed    . TOPROL XL 50 MG 24 hr tablet TAKE 1 TABLET DAILY. TAKE WITH OR IMMEDIATELY FOLLOWING A MEAL 90 tablet 3  . vitamin E 400 UNIT capsule Take 400 Units by mouth daily.     No current facility-administered medications on file prior to visit.     Past Medical History:    Diagnosis Date  . Allergy    seasonal  . Ankylosing spondylitis ([place])   . Anxiety   . Arthritis   . Bell palsy   . CTS (carpal tunnel syndrome)   . Cystitis   . Diverticulosis of colon   . Dry eye syndrome   . Frozen shoulder   . GERD (gastroesophageal reflux disease)   . HLA B27 (HLA B27 positive)   . Hx of colonic polyps   . Hyperlipidemia    NMR 2005  . Hypertension   . Hypothyroidism   . Incontinence   . Interstitial cystitis   . Kidney stone   . Menopause   . Osteoarthritis   . Osteopenia    [facility] [address]  . Osteopenia   . Sleep apnea   . Subjective visual disturbance of both eyes 12/30/2013  . Vitamin D deficiency   . Xerostomia    and Xeroophthalmia    Past Surgical History:  Procedure Laterality Date  . ABDOMINAL HYSTERECTOMY    . BLADDER SURGERY     for incontinence  . CARPAL TUNNEL RELEASE Left 12/22/2014   Procedure: LEFT CARPAL TUNNEL RELEASE;  Surgeon: Daryll Brod, MD;  Location: [facility];  Service: Orthopedics;  Laterality: Left;  . COLONOSCOPY W/ POLYPECTOMY     adenomatous poyp 04-2008  . colonscopy     tics 10-2002  . CYSTOSCOPY WITH RETROGRADE PYELOGRAM, URETEROSCOPY AND STENT PLACEMENT Left 09/01/2013   Procedure: CYSTOSCOPY WITH RETROGRADE PYELOGRAM, AND LEFT STENT PLACEMENT;  Surgeon: Molli Hazard, MD;  Location: [facility];  Service: Urology;  Laterality: Left;  . elevated LFTs     due to Elmiron  . FINGER SURGERY Left 2004   2nd finger  . G 1 P 1    . interstitial cystitis     with clot in catheter  . SHOULDER ARTHROSCOPY Left   . TOTAL ABDOMINAL HYSTERECTOMY W/ BILATERAL SALPINGOOPHORECTOMY     dysfunctional menses    Social History   Social History  . Marital status: Married    Spouse name: N/A  . Number of children: 1  . Years of education: BS   Occupational History  . retired   . teacher     Kindergarten   Social History Main Topics  . Smoking status: Never Smoker  . Smokeless  tobacco: Never Used  . Alcohol use Yes     Comment: 1 glass of wine a years  . Drug use: No  . Sexual activity: Not Currently   Other Topics Concern  . None   Social History Narrative   Married          Family History  Problem Relation Age of Onset  . Coronary artery disease Mother   . Hypertension Mother   . Asthma Mother   . Osteoporosis Mother   . Dementia Mother   . Stroke  Mother     mini cva  . Diabetes Father   . Cirrhosis Father     non alcoholic  . Cancer Sister     colon  . Colon cancer Sister 37  . Cancer Paternal Aunt     2 aunts had renal cancer  . Coronary artery disease Maternal Grandfather   . Cancer Maternal Aunt     breast  . Diabetes Paternal Aunt   . Diabetes Paternal Grandmother     Review of Systems  Constitutional: Negative for fever.  Gastrointestinal: Negative for abdominal pain (occ discomfort in RUQ, visible lump when standing), blood in stool, constipation, diarrhea and nausea.       No gerd  Neurological: Positive for dizziness (mild, intermittent). Negative for headaches.       Objective:   Vitals:   06/28/16 1611  BP: 136/82  Pulse: (!) 59  Resp: 16  Temp: 98.1 F (36.7 C)   Filed Weights   06/28/16 1611  Weight: 172 lb (78 kg)   Body mass index is 32.5 kg/m.   Physical Exam  Constitutional: She appears well-developed and well-nourished. No distress.  Abdominal: Soft. Bowel sounds are normal. She exhibits no distension and no mass. There is no tenderness. There is no rebound and no guarding.  Skin: Skin is warm and dry. No rash noted. She is not diaphoretic. No erythema.          Assessment & Plan:    See Problem List for Assessment and Plan of chronic medical problems.   30 minutes were spent face-to-face with the patient, over 50% of which was spent counseling regarding her new diagnosis of osteoporosis including etiology and treatment options

## 2016-06-28 NOTE — Patient Instructions (Signed)

## 2016-06-29 ENCOUNTER — Other Ambulatory Visit: Payer: Self-pay | Admitting: Orthopedic Surgery

## 2016-06-29 ENCOUNTER — Encounter: Payer: Self-pay | Admitting: Internal Medicine

## 2016-06-29 DIAGNOSIS — M810 Age-related osteoporosis without current pathological fracture: Secondary | ICD-10-CM | POA: Diagnosis not present

## 2016-06-29 DIAGNOSIS — R1011 Right upper quadrant pain: Secondary | ICD-10-CM | POA: Insufficient documentation

## 2016-06-29 DIAGNOSIS — M859 Disorder of bone density and structure, unspecified: Secondary | ICD-10-CM | POA: Diagnosis not present

## 2016-06-29 DIAGNOSIS — N289 Disorder of kidney and ureter, unspecified: Secondary | ICD-10-CM | POA: Insufficient documentation

## 2016-06-29 DIAGNOSIS — F419 Anxiety disorder, unspecified: Secondary | ICD-10-CM | POA: Diagnosis not present

## 2016-06-29 DIAGNOSIS — M818 Other osteoporosis without current pathological fracture: Secondary | ICD-10-CM | POA: Diagnosis not present

## 2016-06-29 DIAGNOSIS — Z1329 Encounter for screening for other suspected endocrine disorder: Secondary | ICD-10-CM | POA: Diagnosis not present

## 2016-06-29 NOTE — Assessment & Plan Note (Signed)
Reviewed dexa and dec in density over last two years Reviewed calcium and vitamin D intake - will check vitamin D level with next labs Stressed regular exercise - walking, plus weights/yoga/pilates for UE's Discussed treatment of OP, specifically fosamax and actonel She will consider treatment and let me know

## 2016-06-29 NOTE — Assessment & Plan Note (Signed)
Taking 5000 units daily Will check level with next blood work

## 2016-06-29 NOTE — Assessment & Plan Note (Signed)
Few episodes of discomfort and lump in RUQ Mild in nature Exam in normal No concerning symptoms Monitor for now

## 2016-06-29 NOTE — Assessment & Plan Note (Signed)
Still having some intermittent dizziness Can consider neuro referral, possible PT if persists

## 2016-07-13 ENCOUNTER — Other Ambulatory Visit: Payer: Self-pay | Admitting: Obstetrics and Gynecology

## 2016-07-13 DIAGNOSIS — Z1231 Encounter for screening mammogram for malignant neoplasm of breast: Secondary | ICD-10-CM

## 2016-08-03 DIAGNOSIS — M810 Age-related osteoporosis without current pathological fracture: Secondary | ICD-10-CM | POA: Diagnosis not present

## 2016-08-07 DIAGNOSIS — M810 Age-related osteoporosis without current pathological fracture: Secondary | ICD-10-CM | POA: Diagnosis not present

## 2016-08-08 ENCOUNTER — Ambulatory Visit (HOSPITAL_BASED_OUTPATIENT_CLINIC_OR_DEPARTMENT_OTHER): Admit: 2016-08-08 | Payer: Medicare Other | Admitting: Orthopedic Surgery

## 2016-08-08 ENCOUNTER — Encounter (HOSPITAL_BASED_OUTPATIENT_CLINIC_OR_DEPARTMENT_OTHER): Payer: Self-pay

## 2016-08-08 SURGERY — CARPAL TUNNEL RELEASE
Anesthesia: Regional | Laterality: Right

## 2016-08-16 ENCOUNTER — Ambulatory Visit: Payer: Self-pay

## 2016-08-29 DIAGNOSIS — M810 Age-related osteoporosis without current pathological fracture: Secondary | ICD-10-CM | POA: Diagnosis not present

## 2016-08-29 MED FILL — FORTEO 600 MCG/2.4 ML PEN I: 600 | 30 days supply | Qty: 2 | Fill #0

## 2016-08-31 DIAGNOSIS — G4733 Obstructive sleep apnea (adult) (pediatric): Secondary | ICD-10-CM | POA: Diagnosis not present

## 2016-09-01 DIAGNOSIS — Z1231 Encounter for screening mammogram for malignant neoplasm of breast: Secondary | ICD-10-CM | POA: Diagnosis not present

## 2016-09-08 ENCOUNTER — Ambulatory Visit (INDEPENDENT_AMBULATORY_CARE_PROVIDER_SITE_OTHER): Payer: Medicare Other | Admitting: Family Medicine

## 2016-09-08 ENCOUNTER — Encounter: Payer: Self-pay | Admitting: Family Medicine

## 2016-09-08 VITALS — BP 114/75 | HR 50 | Temp 98.1°F | Resp 20 | Wt 169.8 lb

## 2016-09-08 DIAGNOSIS — R1030 Lower abdominal pain, unspecified: Secondary | ICD-10-CM | POA: Diagnosis not present

## 2016-09-08 LAB — POC URINALSYSI DIPSTICK (AUTOMATED)
Bilirubin, UA: NEGATIVE
Glucose, UA: NEGATIVE
KETONES UA: NEGATIVE
LEUKOCYTES UA: NEGATIVE
NITRITE UA: NEGATIVE
PH UA: 5.5
RBC UA: NEGATIVE
Spec Grav, UA: 1.025
Urobilinogen, UA: 0.2

## 2016-09-08 NOTE — Patient Instructions (Signed)
This is likely a virus and needs to run its course.  Rest, hydrate and use over the counter medications for symptoms.

## 2016-09-08 NOTE — Progress Notes (Signed)
[name] , Jun 14, 1946, 71 y.o., female MRN: [id number] Patient Care Team    Relationship Specialty Notifications Start End  Binnie Rail, MD PCP - General Internal Medicine  11/09/15     CC: Abd pain Subjective: Pt presents for an acute OV with complaints of abd pain  of 1 week duration.  Associated symptoms include occasional chill, low grade fever, not feeling well, abd cramping, possibly urinary frequency, mild weakness. She denies URI sx, diarrhea, nausea, vomit or dysuria.   Allergies  Allergen Reactions  . Nitroglycerin Anaphylaxis    Hypotensive after NTG administration in ER during chest pain evaluation  . Pentosan Polysulfate Sodium     Elevated LFTs.......Marland Kitchen elmiron   . Bactrim [Sulfamethoxazole-Trimethoprim]   . Promethazine Hcl     "jittery on the inside"  . Lisinopril Cough  . Sulfamethoxazole-Trimethoprim Nausea Only   Social History  Substance Use Topics  . Smoking status: Never Smoker  . Smokeless tobacco: Never Used  . Alcohol use Yes     Comment: 1 glass of wine a years   Past Medical History:  Diagnosis Date  . Allergy    seasonal  . Ankylosing spondylitis ([place])   . Anxiety   . Arthritis   . Bell palsy   . CTS (carpal tunnel syndrome)   . Cystitis   . Diverticulosis of colon   . Dry eye syndrome   . Frozen shoulder   . GERD (gastroesophageal reflux disease)   . HLA B27 (HLA B27 positive)   . Hx of colonic polyps   . Hyperlipidemia    NMR 2005  . Hypertension   . Hypothyroidism   . Incontinence   . Interstitial cystitis   . Kidney stone   . Menopause   . Osteoarthritis   . Osteopenia    [facility] [address]  . Osteopenia   . S/P total hysterectomy and bilateral salpingo-oophorectomy 04/12/2014  . Sleep apnea   . Subjective visual disturbance of both eyes 12/30/2013  . Vitamin D deficiency   . Xerostomia    and Xeroophthalmia   Past Surgical History:  Procedure Laterality Date  . ABDOMINAL HYSTERECTOMY    .  BLADDER SURGERY     for incontinence  . CARPAL TUNNEL RELEASE Left 12/22/2014   Procedure: LEFT CARPAL TUNNEL RELEASE;  Surgeon: Daryll Brod, MD;  Location: [facility];  Service: Orthopedics;  Laterality: Left;  . COLONOSCOPY W/ POLYPECTOMY     adenomatous poyp 04-2008  . colonscopy     tics 10-2002  . CYSTOSCOPY WITH RETROGRADE PYELOGRAM, URETEROSCOPY AND STENT PLACEMENT Left 09/01/2013   Procedure: CYSTOSCOPY WITH RETROGRADE PYELOGRAM, AND LEFT STENT PLACEMENT;  Surgeon: Molli Hazard, MD;  Location: [facility];  Service: Urology;  Laterality: Left;  . elevated LFTs     due to Elmiron  . FINGER SURGERY Left 2004   2nd finger  . G 1 P 1    . interstitial cystitis     with clot in catheter  . SHOULDER ARTHROSCOPY Left   . TOTAL ABDOMINAL HYSTERECTOMY W/ BILATERAL SALPINGOOPHORECTOMY     dysfunctional menses   Family History  Problem Relation Age of Onset  . Coronary artery disease Mother   . Hypertension Mother   . Asthma Mother   . Osteoporosis Mother   . Dementia Mother   . Stroke Mother     mini cva  . Diabetes Father   . Cirrhosis Father  non alcoholic  . Cancer Sister     colon  . Colon cancer Sister 28  . Cancer Paternal Aunt     2 aunts had renal cancer  . Coronary artery disease Maternal Grandfather   . Cancer Maternal Aunt     breast  . Diabetes Paternal Aunt   . Diabetes Paternal Grandmother    Allergies as of 09/08/2016      Reactions   Nitroglycerin Anaphylaxis   Hypotensive after NTG administration in ER during chest pain evaluation   Pentosan Polysulfate Sodium    Elevated LFTs.......Marland Kitchen elmiron    Bactrim [sulfamethoxazole-trimethoprim]    Promethazine Hcl    "jittery on the inside"   Lisinopril Cough   Sulfamethoxazole-trimethoprim Nausea Only      Medication List       Accurate as of 09/08/16  1:38 PM. Always use your most recent med list.          aspirin 81 MG tablet Take 81 mg by mouth daily.   blood glucose meter kit  and supplies Kit Dispense based on patient and insurance preference. Use up to four times daily as directed. prediabetes   cetirizine 10 MG tablet Commonly known as:  ZYRTEC Take 10 mg by mouth daily.   COSAMIN ASU ADVANCED FORMULA PO Take 2 each by mouth daily.   Diclofenac Sodium 2 % Soln Commonly known as:  [name] Place 2 application onto the skin 2 (two) times daily.   FISH OIL BURP-LESS 1000 MG Caps Takes 1000 mg BID   ibuprofen 800 MG tablet Commonly known as:  ADVIL,MOTRIN Take 800 mg by mouth.   meloxicam 15 MG tablet Commonly known as:  MOBIC Take 1 tablet (15 mg total) by mouth daily.   OSCIMIN 0.125 MG Subl Generic drug:  Hyoscyamine Sulfate SL   SYSTANE OP Apply 1 drop to eye as needed. Apply to both eyes as needed   TOPROL XL 50 MG 24 hr tablet Generic drug:  metoprolol succinate TAKE 1 TABLET DAILY. TAKE WITH OR IMMEDIATELY FOLLOWING A MEAL   vitamin B-12 1000 MCG tablet Commonly known as:  CYANOCOBALAMIN Take by mouth.   vitamin C 1000 MG tablet Take by mouth.   Vitamin D3 10000 units capsule Take by mouth.   vitamin E 400 UNIT capsule Take by mouth.   vitamin E 400 UNIT capsule Take 400 Units by mouth daily.       Results for orders placed or performed in visit on 09/08/16 (from the past 24 hour(s))  POCT Urinalysis Dipstick (Automated)     Status: Abnormal   Collection Time: 09/08/16  1:36 PM  Result Value Ref Range   Color, UA yellow    Clarity, UA clear    Glucose, UA Negative    Bilirubin, UA Negative    Ketones, UA Negative    Spec Grav, UA 1.025    Blood, UA Negative    pH, UA 5.5    Protein, UA trace    Urobilinogen, UA 0.2    Nitrite, UA Negative    Leukocytes, UA Negative Negative   No results found.   ROS: Negative, with the exception of above mentioned in HPI   Objective:  BP 114/75 (BP Location: Left Arm, Patient Position: Sitting, Cuff Size: Normal)   Pulse (!) 50   Temp 98.1 F (36.7 C)   Resp 20   Wt  169 lb 12 oz (77 kg)   SpO2 98%   BMI 32.07 kg/m  Body mass index  is 32.07 kg/m. Gen: Afebrile. No acute distress. Nontoxic in appearance, well developed, well nourished.  HENT: AT. [place]. Bilateral TM visualized wnl. MMM, no oral lesions. Bilateral nares wnl. Throat without erythema or exudates. No cough.  Eyes:Pupils Equal Round Reactive to light, Extraocular movements intact,  Conjunctiva without redness, discharge or icterus. Neck/lymp/endocrine: Supple,no lymphadenopathy CV: RRR  Chest: CTAB, no wheeze or crackles. Good air movement, normal resp effort.  Abd: Soft. obese. NTND. BS present. no Masses palpated. No rebound or guarding.  Skin: no rashes, purpura or petechiae.  Neuro:  Normal gait. PERLA. EOMi. Alert. Oriented x3   Results for orders placed or performed in visit on 09/08/16 (from the past 24 hour(s))  POCT Urinalysis Dipstick (Automated)     Status: Abnormal   Collection Time: 09/08/16  1:36 PM  Result Value Ref Range   Color, UA yellow    Clarity, UA clear    Glucose, UA Negative    Bilirubin, UA Negative    Ketones, UA Negative    Spec Grav, UA 1.025    Blood, UA Negative    pH, UA 5.5    Protein, UA trace    Urobilinogen, UA 0.2    Nitrite, UA Negative    Leukocytes, UA Negative Negative     Assessment/Plan: [name] is a 71 y.o. female present for acute OV for  Lower abdominal pain/viral - likely viral. Rest, hydrate, OTC therapy - POCT Urinalysis Dipstick (Automated) - F/u PRN   electronically signed by:  Howard Pouch, DO  [place]

## 2016-09-11 ENCOUNTER — Ambulatory Visit (INDEPENDENT_AMBULATORY_CARE_PROVIDER_SITE_OTHER): Payer: Medicare Other | Admitting: Nurse Practitioner

## 2016-09-11 ENCOUNTER — Encounter: Payer: Self-pay | Admitting: Nurse Practitioner

## 2016-09-11 ENCOUNTER — Ambulatory Visit (INDEPENDENT_AMBULATORY_CARE_PROVIDER_SITE_OTHER)
Admission: RE | Admit: 2016-09-11 | Discharge: 2016-09-11 | Disposition: A | Discharge status: Home / Self Care | Payer: Medicare Other | Source: Ambulatory Visit | Attending: Nurse Practitioner | Admitting: Nurse Practitioner

## 2016-09-11 ENCOUNTER — Other Ambulatory Visit: Payer: Self-pay | Admitting: Nurse Practitioner

## 2016-09-11 VITALS — BP 122/82 | HR 55 | Temp 98.6°F | Ht 61.0 in | Wt 175.0 lb

## 2016-09-11 DIAGNOSIS — M25472 Effusion, left ankle: Secondary | ICD-10-CM

## 2016-09-11 DIAGNOSIS — M25572 Pain in left ankle and joints of left foot: Secondary | ICD-10-CM | POA: Diagnosis not present

## 2016-09-11 DIAGNOSIS — S99912A Unspecified injury of left ankle, initial encounter: Secondary | ICD-10-CM

## 2016-09-11 DIAGNOSIS — S8252XA Displaced fracture of medial malleolus of left tibia, initial encounter for closed fracture: Secondary | ICD-10-CM | POA: Diagnosis not present

## 2016-09-11 MED ORDER — MELOXICAM 15 MG PO TABS: 15.0000 mg | ORAL_TABLET | Freq: Every day | ORAL | 2 refills | Status: DC | PRN

## 2016-09-11 NOTE — Progress Notes (Signed)
Patient received education resource, including the self-management goal and tool. Patient verbalized understanding. 

## 2016-09-11 NOTE — Progress Notes (Signed)
Subjective:  Patient ID: [name], female    DOB: April 05, 1946  Age: 71 y.o. MRN: [id number]  CC: Foot Injury (left shoulder/left foot sore (cant move it up/down only side way) had accident yesterday. took tylanol. )   Foot Injury   The incident occurred 2 days ago. The incident occurred at home. The injury mechanism was a twisting injury. The pain is present in the right foot and right ankle. The quality of the pain is described as aching and burning. The pain is severe. The pain has been constant since onset. Associated symptoms include an inability to bear weight and a loss of motion. Pertinent negatives include no loss of sensation, muscle weakness, numbness or tingling. She reports no foreign bodies present. The symptoms are aggravated by movement, palpation and weight bearing. She has tried elevation and ice for the symptoms. The treatment provided mild relief.    Outpatient Medications Prior to Visit  Medication Sig Dispense Refill  . Ascorbic Acid (VITAMIN C) 1000 MG tablet Take by mouth.    Marland Kitchen aspirin 81 MG tablet Take 81 mg by mouth daily.      . blood glucose meter kit and supplies KIT Dispense based on patient and insurance preference. Use up to four times daily as directed. prediabetes 1 each 0  . cetirizine (ZYRTEC) 10 MG tablet Take 10 mg by mouth daily.      . Cholecalciferol (VITAMIN D3) 10000 units capsule Take by mouth.    . Omega-3 Fatty Acids (FISH OIL BURP-LESS) 1000 MG CAPS Takes 1000 mg BID 60 capsule 0  . OSCIMIN 0.125 MG SL tablet     . Polyethyl Glycol-Propyl Glycol (SYSTANE OP) Apply 1 drop to eye as needed. Apply to both eyes as needed    . TOPROL XL 50 MG 24 hr tablet TAKE 1 TABLET DAILY. TAKE WITH OR IMMEDIATELY FOLLOWING A MEAL 90 tablet 3  . vitamin B-12 (CYANOCOBALAMIN) 1000 MCG tablet Take by mouth.    . vitamin E 400 UNIT capsule Take 400 Units by mouth daily.    . Diclofenac Sodium ([name]) 2 % SOLN Place 2 application onto the skin 2 (two) times  daily. 112 g 3  . ibuprofen (ADVIL,MOTRIN) 800 MG tablet Take 800 mg by mouth.    . meloxicam (MOBIC) 15 MG tablet Take 1 tablet (15 mg total) by mouth daily. 90 tablet 2  . Misc Natural Products (COSAMIN ASU ADVANCED FORMULA PO) Take 2 each by mouth daily.    . vitamin E 400 UNIT capsule Take by mouth.     No facility-administered medications prior to visit.     ROS See HPI  Objective:  BP 122/82   Pulse (!) 55   Temp 98.6 F (37 C)   Ht _0  (1.549 m)   Wt 175 lb (79.4 kg)   SpO2 96%   BMI 33.07 kg/m   BP Readings from Last 3 Encounters:  09/11/16 122/82  09/08/16 114/75  06/28/16 136/82    Wt Readings from Last 3 Encounters:  09/11/16 175 lb (79.4 kg)  09/08/16 169 lb 12 oz (77 kg)  06/28/16 172 lb (78 kg)    Physical Exam  Constitutional: She is oriented to person, place, and time.  Cardiovascular: Normal rate.   Pulmonary/Chest: Effort normal.  Musculoskeletal: She exhibits edema and tenderness.       Right ankle: She exhibits decreased range of motion and swelling. She exhibits no ecchymosis, no deformity, no laceration and normal pulse. Tenderness.  Lateral malleolus and AITFL tenderness found. No medial malleolus, no CF ligament, no posterior TFL, no head of 5th metatarsal and no proximal fibula tenderness found. Achilles tendon normal.  Neurological: She is alert and oriented to person, place, and time.    Lab Results  Component Value Date   WBC 5.8 05/02/2016   HGB 12.8 05/02/2016   HCT 37.1 05/02/2016   PLT 265.0 05/02/2016   GLUCOSE 96 05/02/2016   CHOL 193 05/02/2016   TRIG 206.0 (H) 05/02/2016   HDL 57.20 05/02/2016   LDLDIRECT 114.0 05/02/2016   LDLCALC 98 06/03/2015   ALT 26 05/02/2016   AST 26 05/02/2016   NA 140 05/02/2016   K 4.2 05/02/2016   CL 103 05/02/2016   CREATININE 1.00 05/02/2016   BUN 16 05/02/2016   CO2 27 05/02/2016   TSH 3.87 05/02/2016   HGBA1C 5.9 05/02/2016   MICROALBUR 0.8 07/23/2006    Dg Bone  Density  Addendum Date: 08/09/2016   ADDENDUM REPORT: 08/09/2016 15:03 ADDENDUM: I have reviewed positioning of the left hip on current study and compared to prior study from 2015. Positioning is felt to be appropriate for each study and essentially identical between the studies. Electronically Signed   By: Lowella Grip III M.D.   On: 08/09/2016 15:03   Addendum Date: 08/09/2016   ADDENDUM REPORT: 08/09/2016 10:44 ADDENDUM: On the previous study of April 28, 2014, the patient had a left femoral neck measurement of measured bone mineral density value was 0.960 g per square cm. The measured value of the left femoral neck on the current study is 0.734 g per square cm. This value is felt to be significantly decreased currently compared to the most recent prior study. Lumbar value was not measured due to degenerative type change felt to spur a sleeve elevated value. The left radius ultra distal measurement on the current study is 0.304 g per square cm with a T-score value of -3.6. The measured value in the mid radial region currently is 0.5 40 g per square cm with a T-score value of -3.9. The current study has a right femoral neck bone density measured value of 0.784 g per square cm with a T-score of -1.8. The total left proximal femur measurement on the left is 0.841 with a T-score -1.3. The total right proximal femur value on the current study 0.885 g per square cm with a T-score of -1.0. Electronically Signed   By: Lowella Grip III M.D.   On: 08/09/2016 10:44   Result Date: 08/09/2016 EXAM: DUAL X-RAY ABSORPTIOMETRY (DXA) FOR BONE MINERAL DENSITY IMPRESSION: Referring Physician:  Billey Gosling PATIENT: Name: [name], [name] Patient ID: [id number] Birth Date: [birth year] Height: 62.3 in. Sex: Female Measured: 06/20/2016 Weight: 170.0 lbs. Indications: Advanced Age, Bilateral Ovariectomy (65.51), Caucasian, Estrogen Deficient, History of Osteopenia, Low Calcium Intake (269.3), Postmenopausal Fractures:  None Treatments: Vitamin D (E933.5) ASSESSMENT: The BMD measured at Forearm Radius 33% is 0.540 g/cm2 with a T-score of -3.9. This patient is considered osteoporotic according to [facility]) criteria. Lumbar spine was not utilized due to advanced degenerative changes. There has been a statistically significant decrease in BMD of left hip since prior exam dated 04/28/2014. Site Region Measured Date Measured Age YA BMD Significant CHANGE T-score Left Forearm Radius 33% 06/20/2016 72.3 -3.9 0.540 g/cm2 DualFemur Neck Left 06/20/2016 72.3 -2.2 0.734 g/cm2 * World Health Organization [facility]) criteria for post-menopausal, Caucasian Women: Normal       T-score at or above -1 SD  Osteopenia   T-score between -1 and -2.5 SD Osteoporosis T-score at or below -2.5 SD RECOMMENDATION: [facility] recommends that FDA-approved medical therapies be considered in postmenopausal women and men age 63 or older with a: 1. Hip or vertebral (clinical or morphometric) fracture. 2. T-score of <-2.5 at the spine or hip. 3. Ten-year fracture probability by FRAX of 3% or greater for hip fracture or 20% or greater for major osteoporotic fracture. All treatment decisions require clinical judgment and consideration of individual patient factors, including patient preferences, co-morbidities, previous drug use, risk factors not captured in the FRAX model (e.g. falls, vitamin D deficiency, increased bone turnover, interval significant decline in bone density) and possible under - or over-estimation of fracture risk by FRAX. All patients should ensure an adequate intake of dietary calcium (1200 mg/d) and vitamin D (800 IU daily) unless contraindicated. FOLLOW-UP: People with diagnosed cases of osteoporosis or at high risk for fracture should have regular bone mineral density tests. For patients eligible for Medicare, routine testing is allowed once every 2 years. The testing frequency can be increased to one year  for patients who have rapidly progressing disease, those who are receiving or discontinuing medical therapy to restore bone mass, or have additional risk factors. I have reviewed this report, and agree with the above findings. [facility] Electronically Signed: By: Lowella Grip III M.D. On: 06/20/2016 09:06    Assessment & Plan:   Aileana was seen today for foot injury.  Diagnoses and all orders for this visit:  Left ankle injury, initial encounter -     Cancel: DG Ankle Complete Right; Future -     AMB referral to orthopedics -     meloxicam (MOBIC) 15 MG tablet; Take 1 tablet (15 mg total) by mouth daily as needed for pain. With food  Pain and swelling of ankle, left -     Cancel: DG Ankle Complete Right; Future -     AMB referral to orthopedics -     meloxicam (MOBIC) 15 MG tablet; Take 1 tablet (15 mg total) by mouth daily as needed for pain. With food   I have discontinued Ms. Heitmeyer's ibuprofen and Diclofenac Sodium. I have also changed her meloxicam. Additionally, I am having her maintain her aspirin, cetirizine, vitamin E, Polyethyl Glycol-Propyl Glycol (SYSTANE OP), Misc Natural Products (COSAMIN ASU ADVANCED FORMULA PO), OSCIMIN, blood glucose meter kit and supplies, FISH OIL BURP-LESS, TOPROL XL, vitamin C, Vitamin D3, vitamin B-12, vitamin E, Calcium Carbonate-Vit D-Min (CALCIUM 1200 PO), and acetaminophen.  Meds ordered this encounter  Medications  . Calcium Carbonate-Vit D-Min (CALCIUM 1200 PO)    Sig: Take 1,200 mg by mouth 2 (two) times daily.  Marland Kitchen acetaminophen (TYLENOL) 500 MG tablet    Sig: Take by mouth.  . meloxicam (MOBIC) 15 MG tablet    Sig: Take 1 tablet (15 mg total) by mouth daily as needed for pain. With food    Dispense:  90 tablet    Refill:  2    Order Specific Question:   Supervising Provider    Answer:   Cassandria Anger [[id number]]    Follow-up: Return if symptoms worsen or fail to improve.  Wilfred Lacy, NP

## 2016-09-11 NOTE — Patient Instructions (Signed)
Ankle Sprain Introduction An ankle sprain is a stretch or tear in one of the tough tissues (ligaments) in your ankle. Follow these instructions at home:  Rest your ankle.  Take over-the-counter and prescription medicines only as told by your doctor.  For 2-3 days, keep your ankle higher than the level of your heart (elevated) as much as possible.  If directed, put ice on the area:  Put ice in a plastic bag.  Place a towel between your skin and the bag.  Leave the ice on for 20 minutes, 2-3 times a day.  If you were given a brace:  Wear it as told.  Take it off to shower or bathe.  Try not to move your ankle much, but wiggle your toes from time to time. This helps to prevent swelling.  If you were given an elastic bandage (dressing):  Take it off when you shower or bathe.  Try not to move your ankle much, but wiggle your toes from time to time. This helps to prevent swelling.  Adjust the bandage to make it more comfortable if it feels too tight.  Loosen the bandage if you lose feeling in your foot, your foot tingles, or your foot gets cold and blue.  If you have crutches, use them as told by your doctor. Continue to use them until you can walk without feeling pain in your ankle. Contact a doctor if:  Your bruises or swelling are quickly getting worse.  Your pain does not get better after you take medicine. Get help right away if:  You cannot feel your toes or foot.  Your toes or your foot looks blue.  You have very bad pain that gets worse. This information is not intended to replace advice given to you by your health care provider. Make sure you discuss any questions you have with your health care provider. Document Released: 01/03/2008 Document Revised: 12/23/2015 Document Reviewed: 02/16/2015  2017 [employer]  

## 2016-09-12 DIAGNOSIS — M810 Age-related osteoporosis without current pathological fracture: Secondary | ICD-10-CM | POA: Diagnosis not present

## 2016-09-13 ENCOUNTER — Ambulatory Visit: Payer: Self-pay

## 2016-09-13 ENCOUNTER — Ambulatory Visit (INDEPENDENT_AMBULATORY_CARE_PROVIDER_SITE_OTHER): Payer: Medicare Other | Admitting: Family Medicine

## 2016-09-13 ENCOUNTER — Encounter: Payer: Self-pay | Admitting: Family Medicine

## 2016-09-13 VITALS — BP 142/102 | HR 62 | Ht 63.0 in | Wt 172.0 lb

## 2016-09-13 DIAGNOSIS — M24472 Recurrent dislocation, left ankle: Secondary | ICD-10-CM | POA: Diagnosis not present

## 2016-09-13 DIAGNOSIS — M25572 Pain in left ankle and joints of left foot: Secondary | ICD-10-CM

## 2016-09-13 NOTE — Patient Instructions (Signed)
Good to see you  Wear brace daily for 2 weeks Exercises 3 times a week.  Ice 10 minutes at night  Make sure you are doing 5000 IU daily of vitamin D but we could always do the once weekly vitamin D See me again in 2-3 weeks if not perfect .

## 2016-09-13 NOTE — Progress Notes (Signed)
[name] [address] [place], [place] [address] Phone: [phone] Subjective:    I'm seeing this patient by the request  of: Binnie Rail, MD    CC: left ankle pain   HXT:[id number]  [name] is a 71 y.o. female coming in with complaint of Left ankle pain. Patient tripped over a rug. Patient was sent and didn't have pain x-rays done of this ankle. Patient's x-ray and show a tiny avulsion fracture of the lateral malleolus and some over-the-counter medial malleolus is. Seems all to me and not new. Patient states on the pain seems to be on the lateral aspect the ankle. Had some swelling. Mild bruising. An states that she has been able to angulate just fine. States that there is a soreness. No numbness. Rates the severity pain is 4 out of 10.     Past Medical History:  Diagnosis Date  . Allergy    seasonal  . Ankylosing spondylitis ([place])   . Anxiety   . Arthritis   . Bell palsy   . CTS (carpal tunnel syndrome)   . Cystitis   . Diverticulosis of colon   . Dry eye syndrome   . Frozen shoulder   . GERD (gastroesophageal reflux disease)   . HLA B27 (HLA B27 positive)   . Hx of colonic polyps   . Hyperlipidemia    NMR 2005  . Hypertension   . Hypothyroidism   . Incontinence   . Interstitial cystitis   . Kidney stone   . Menopause   . Osteoarthritis   . Osteopenia    [facility] [address]  . Osteopenia   . S/P total hysterectomy and bilateral salpingo-oophorectomy 04/12/2014  . Sleep apnea   . Subjective visual disturbance of both eyes 12/30/2013  . Vitamin D deficiency   . Xerostomia    and Xeroophthalmia   Past Surgical History:  Procedure Laterality Date  . ABDOMINAL HYSTERECTOMY    . BLADDER SURGERY     for incontinence  . CARPAL TUNNEL RELEASE Left 12/22/2014   Procedure: LEFT CARPAL TUNNEL RELEASE;  Surgeon: Daryll Brod, MD;  Location: [facility];  Service: Orthopedics;  Laterality: Left;  .  COLONOSCOPY W/ POLYPECTOMY     adenomatous poyp 04-2008  . colonscopy     tics 10-2002  . CYSTOSCOPY WITH RETROGRADE PYELOGRAM, URETEROSCOPY AND STENT PLACEMENT Left 09/01/2013   Procedure: CYSTOSCOPY WITH RETROGRADE PYELOGRAM, AND LEFT STENT PLACEMENT;  Surgeon: Molli Hazard, MD;  Location: [facility];  Service: Urology;  Laterality: Left;  . elevated LFTs     due to Elmiron  . FINGER SURGERY Left 2004   2nd finger  . G 1 P 1    . interstitial cystitis     with clot in catheter  . SHOULDER ARTHROSCOPY Left   . TOTAL ABDOMINAL HYSTERECTOMY W/ BILATERAL SALPINGOOPHORECTOMY     dysfunctional menses   Social History   Social History  . Marital status: Married    Spouse name: N/A  . Number of children: 1  . Years of education: BS   Occupational History  . retired   . teacher     Kindergarten   Social History Main Topics  . Smoking status: Never Smoker  . Smokeless tobacco: Never Used  . Alcohol use Yes     Comment: 1 glass of wine a years  . Drug use: No  . Sexual activity: Not Currently   Other  Topics Concern  . Not on file   Social History Narrative   Married         Allergies  Allergen Reactions  . Nitroglycerin Anaphylaxis    Hypotensive after NTG administration in ER during chest pain evaluation  . Pentosan Polysulfate Sodium     Elevated LFTs.......Marland Kitchen elmiron   . Bactrim [Sulfamethoxazole-Trimethoprim]   . Promethazine Hcl     "jittery on the inside"  . Lisinopril Cough  . Sulfamethoxazole-Trimethoprim Nausea Only   Family History  Problem Relation Age of Onset  . Coronary artery disease Mother   . Hypertension Mother   . Asthma Mother   . Osteoporosis Mother   . Dementia Mother   . Stroke Mother     mini cva  . Diabetes Father   . Cirrhosis Father     non alcoholic  . Cancer Sister     colon  . Colon cancer Sister 65  . Cancer Paternal Aunt     2 aunts had renal cancer  . Coronary artery disease Maternal Grandfather   . Cancer Maternal  Aunt     breast  . Diabetes Paternal Aunt   . Diabetes Paternal Grandmother     Past medical history, social, surgical and family history all reviewed in electronic medical record.  No pertanent information unless stated regarding to the chief complaint.   Review of Systems:Review of systems updated and as accurate as of 09/13/16  No headache, visual changes, nausea, vomiting, diarrhea, constipation, dizziness, abdominal pain, skin rash, fevers, chills, night sweats, weight loss, swollen lymph nodes,chest pain, shortness of breath, mood changes.  Positive body ache and muscle pain  Objective  There were no vitals taken for this visit. Systems examined below as of 09/13/16   General: No apparent distress alert and oriented x3 mood and affect normal, dressed appropriately.  HEENT: Pupils equal, extraocular movements intact  Respiratory: Patient's speak in full sentences and does not appear short of breath  Cardiovascular: No lower extremity edema, non tender, no erythema  Skin: Warm dry intact with no signs of infection or rash on extremities or on axial skeleton.  Abdomen: Soft nontender  Neuro: Cranial nerves II through XII are intact, neurovascularly intact in all extremities with 2+ DTRs and 2+ pulses.  Lymph: No lymphadenopathy of posterior or anterior cervical chain or axillae bilaterally.  Gait mild antalgic gait  MSK:  Non tender with full range of motion and good stability and symmetric strength and tone of shoulders, elbows, wrist, hip, knees bilaterally. Arthritic changes of multiple joints Ankle: Left Trace swelling over the lateral aspect the ankle Range of motion is full in all directions. Mild pain with resisted eversion Strength is 5/5 in all directions. Once again mild pain over the eversion of the ankle Stable lateral and medial ligaments; squeeze test and kleiger test unremarkable; Talar dome nontender; No pain at base of 5th MT; No tenderness over cuboid; No  tenderness over N spot or navicular prominence No tenderness on posterior aspects of lateral and medial malleolus Positive subluxation of the peroneal tendon. After manipulation seemed to go into the right area. Negative tarsal tunnel tinel's Able to walk 4 steps. Contralateral ankle unremarkable  MSK US performed of: left ankle This study was ordered, performed, and interpreted by Charlann Boxer D.O.  Foot/Ankle:   All structures visualized.   Talar dome unremarkable  Ankle mortise trace effusion Peroneal tendon hypoechoic changes and now within the area.  Posterior tibialis, flexor hallucis longus, and flexor digitorum  longus tendons unremarkable on long and transverse views without sheath effusions.   IMPRESSION:  Peroneal tendinitis with overlying cyst and mild ankle effusion     Impression and Recommendations:     This case required medical decision making of moderate complexity.      Note: This dictation was prepared with Dragon dictation along with smaller phrase technology. Any transcriptional errors that result from this process are unintentional.

## 2016-09-13 NOTE — Assessment & Plan Note (Signed)
Patient did have more of a peroneal subluxation. Some manipulation done today with some improvement. We discussed bracing, home exercises, patient work with Product/process development scientist. I expect patient to do well with conservative therapy at follow-up again in 3-4 weeks.

## 2016-09-14 ENCOUNTER — Ambulatory Visit (INDEPENDENT_AMBULATORY_CARE_PROVIDER_SITE_OTHER): Payer: Medicare Other | Admitting: Orthopaedic Surgery

## 2016-09-21 ENCOUNTER — Encounter: Payer: Self-pay | Admitting: Cardiology

## 2016-09-21 ENCOUNTER — Ambulatory Visit (INDEPENDENT_AMBULATORY_CARE_PROVIDER_SITE_OTHER): Payer: Medicare Other | Admitting: Cardiology

## 2016-09-21 VITALS — BP 124/64 | HR 56 | Ht 63.0 in | Wt 173.0 lb

## 2016-09-21 DIAGNOSIS — E782 Mixed hyperlipidemia: Secondary | ICD-10-CM

## 2016-09-21 DIAGNOSIS — R079 Chest pain, unspecified: Secondary | ICD-10-CM | POA: Insufficient documentation

## 2016-09-21 DIAGNOSIS — I10 Essential (primary) hypertension: Secondary | ICD-10-CM | POA: Diagnosis not present

## 2016-09-21 DIAGNOSIS — R072 Precordial pain: Secondary | ICD-10-CM

## 2016-09-21 MED ORDER — OLMESARTAN MEDOXOMIL-HCTZ 20-12.5 MG PO TABS: 1.0000 | ORAL_TABLET | Freq: Every day | ORAL | 2 refills | Status: DC

## 2016-09-21 NOTE — Patient Instructions (Signed)
Medication Instructions:   STOP TAKING TOPROL XL NOW  START TAKING BENICAR-HCTZ 20-12.5 MG ONCE DAILY    Labwork:  PRIOR TOO YOUR 2 MONTH FOLLOW-UP APPT WITH DR Meda Coffee TO CHECK--CMET AND LIPIDS--PLEASE COME FASTING TO THIS LAB APPOINTMENT     Testing/Procedures:  Your physician has requested that you have an echocardiogram. Echocardiography is a painless test that uses sound waves to create images of your heart. It provides your doctor with information about the size and shape of your heart and how well your heart's chambers and valves are working. This procedure takes approximately one hour. There are no restrictions for this procedure.    Follow-Up:  2 MONTHS WITH DR NELSON---PLEASE HAVE YOUR LABS DONE A WEEK PRIOR TOO THIS APPOINTMENT       If you need a refill on your cardiac medications before your next appointment, please call your pharmacy.

## 2016-09-21 NOTE — Progress Notes (Signed)
Cardiology Office Note    Date:  09/21/2016   ID:  [name], [name] Aug 27, 1945, MRN [id number]  PCP:  Binnie Rail, MD  Cardiologist:  Ena Dawley, MD   Chief complain: chest pain  History of Present Illness:  [name] is a 71 y.o. female with prior history of hypertension, hyperlipidemia who was previously seen in our office in 2014 for concerns of chest pain and underwent an exercise nuclear stress test that showed LVEF of 70% and no prior infarct or ischemia. She is coming 3 years later for occasional left-sided chest pain not related to exertion. She has been treated with Toprol for high blood pressure. She complains of fatigue and occasional dizziness. She also has occasional lower extremity edema. Denies any orthopnea or proximal nocturnal dyspnea. 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 She has never had an echocardiogram to evaluate for aortic regurgitation or ascending aortic aneurysm. She denies any palpitations or syncope.  Past Medical History:  Diagnosis Date  . Allergy    seasonal  . Ankylosing spondylitis ([place])   . Anxiety   . Arthritis   . Bell palsy   . CTS (carpal tunnel syndrome)   . Cystitis   . Diverticulosis of colon   . Dry eye syndrome   . Frozen shoulder   . GERD (gastroesophageal reflux disease)   . HLA B27 (HLA B27 positive)   . Hx of colonic polyps   . Hyperlipidemia    NMR 2005  . Hypertension   . Hypothyroidism   . Incontinence   . Interstitial cystitis   . Kidney stone   . Menopause   . Osteoarthritis   . Osteopenia    [facility] [address]  . Osteopenia   . S/P total hysterectomy and bilateral salpingo-oophorectomy 04/12/2014  . Sleep apnea   . Subjective visual disturbance of both eyes 12/30/2013  . Vitamin D deficiency   . Xerostomia    and Xeroophthalmia    Past Surgical History:  Procedure Laterality Date  . ABDOMINAL HYSTERECTOMY    . BLADDER SURGERY      for incontinence  . CARPAL TUNNEL RELEASE Left 12/22/2014   Procedure: LEFT CARPAL TUNNEL RELEASE;  Surgeon: Daryll Brod, MD;  Location: [facility];  Service: Orthopedics;  Laterality: Left;  . COLONOSCOPY W/ POLYPECTOMY     adenomatous poyp 04-2008  . colonscopy     tics 10-2002  . CYSTOSCOPY WITH RETROGRADE PYELOGRAM, URETEROSCOPY AND STENT PLACEMENT Left 09/01/2013   Procedure: CYSTOSCOPY WITH RETROGRADE PYELOGRAM, AND LEFT STENT PLACEMENT;  Surgeon: Molli Hazard, MD;  [facility];  Service: Urology;  Laterality: Left;  . elevated LFTs     due to Elmiron  . FINGER SURGERY Left 2004   2nd finger  . G 1 P 1    . interstitial cystitis     with clot in catheter  . SHOULDER ARTHROSCOPY Left   . TOTAL ABDOMINAL HYSTERECTOMY W/ BILATERAL SALPINGOOPHORECTOMY     dysfunctional menses    Current Medications: Outpatient Medications Prior to Visit  Medication Sig Dispense Refill  . acetaminophen (TYLENOL) 500 MG tablet Take by mouth.    . Ascorbic Acid (VITAMIN C) 1000 MG tablet Take by mouth.    Marland Kitchen aspirin 81 MG tablet Take 81 mg by mouth daily.      . blood glucose meter kit and supplies KIT Dispense based on patient and insurance  preference. Use up to four times daily as directed. prediabetes 1 each 0  . Calcium Carbonate-Vit D-Min (CALCIUM 1200 PO) Take 1,200 mg by mouth 2 (two) times daily.    . cetirizine (ZYRTEC) 10 MG tablet Take 10 mg by mouth daily.      . meloxicam (MOBIC) 15 MG tablet Take 1 tablet (15 mg total) by mouth daily as needed for pain. With food 90 tablet 2  . Misc Natural Products (COSAMIN ASU ADVANCED FORMULA PO) Take 2 each by mouth daily.    . Omega-3 Fatty Acids (FISH OIL BURP-LESS) 1000 MG CAPS Takes 1000 mg BID 60 capsule 0  . OSCIMIN 0.125 MG SL tablet     . Polyethyl Glycol-Propyl Glycol (SYSTANE OP) Apply 1 drop to eye as needed. Apply to both eyes as needed    . vitamin B-12 (CYANOCOBALAMIN) 1000 MCG tablet Take by mouth.    .  vitamin E 400 UNIT capsule Take 400 Units by mouth daily.    . TOPROL XL 50 MG 24 hr tablet TAKE 1 TABLET DAILY. TAKE WITH OR IMMEDIATELY FOLLOWING A MEAL 90 tablet 3  . Cholecalciferol (VITAMIN D3) 10000 units capsule Take by mouth.    . vitamin E 400 UNIT capsule Take by mouth.     No facility-administered medications prior to visit.      Allergies:   Nitroglycerin; Pentosan polysulfate sodium; Bactrim [sulfamethoxazole-trimethoprim]; Promethazine hcl; Lisinopril; and Sulfamethoxazole-trimethoprim   Social History   Social History  . Marital status: Married    Spouse name: N/A  . Number of children: 1  . Years of education: BS   Occupational History  . retired   . teacher     Kindergarten   Social History Main Topics  . Smoking status: Never Smoker  . Smokeless tobacco: Never Used  . Alcohol use Yes     Comment: 1 glass of wine a years  . Drug use: No  . Sexual activity: Not Currently   Other Topics Concern  . None   Social History Narrative   Married           Family History:  The patient's family history includes Asthma in her mother; Cancer in her maternal aunt, paternal aunt, and sister; Cirrhosis in her father; Colon cancer (age of onset: 46) in her sister; Coronary artery disease in her maternal grandfather and mother; Dementia in her mother; Diabetes in her father, paternal aunt, and paternal grandmother; Hypertension in her mother; Osteoporosis in her mother; Stroke in her mother.   ROS:   Please see the history of present illness.    ROS All other systems reviewed and are negative.   PHYSICAL EXAM:   VS:  BP 124/64   Pulse (!) 56   Ht 5' 3"  (1.6 m)   Wt 173 lb (78.5 kg)   BMI 30.65 kg/m    GEN: Well nourished, well developed, in no acute distress  HEENT: normal  Neck: no JVD, carotid bruits, or masses Cardiac: RRR; no murmurs, rubs, or gallops,no edema  Respiratory:  clear to auscultation bilaterally, normal work of breathing GI: soft, nontender,  nondistended, + BS MS: no deformity or atrophy  Skin: warm and dry, no rash Neuro:  Alert and Oriented x 3, Strength and sensation are intact Psych: euthymic mood, full affect  Wt Readings from Last 3 Encounters:  09/21/16 173 lb (78.5 kg)  09/13/16 172 lb (78 kg)  09/11/16 175 lb (79.4 kg)      Studies/Labs Reviewed:  EKG:  EKG is ordered today.  The ekg ordered today demonstrates sinus bradycardia, 56 bpm, left axis deviation, nonspecific T wave abnormalities.  Recent Labs: 09/29/2015: Magnesium 2.0 05/02/2016: ALT 26; BUN 16; Creatinine, Ser 1.00; Hemoglobin 12.8; Platelets 265.0; Potassium 4.2; Sodium 140; TSH 3.87   Lipid Panel    Component Value Date/Time   CHOL 193 05/02/2016 0919   TRIG 206.0 (H) 05/02/2016 0919   TRIG 181 (H) 07/23/2006 1101   HDL 57.20 05/02/2016 0919   CHOLHDL 3 05/02/2016 0919   VLDL 41.2 (H) 05/02/2016 0919   LDLCALC 98 06/03/2015 1128   LDLDIRECT 114.0 05/02/2016 0919   Additional studies/ records that were reviewed today include:     ASSESSMENT:    1. Essential hypertension   2. HYPERLIPIDEMIA   3. Precordial pain     PLAN:  In order of problems listed above:  1. Hypertension - the patient brings diary and her blood pressure goes up to 150s, I would discontinue Toprol as she has significant fatigue but also feels tired and sometimes dizzy. I will start Benicar with 40 mg of olmesartan and 12.5 mg of hydrochlorothiazide. We will check an echocardiogram to evaluate for degree of LVH. 2. Hyperlipidemia - patient's LDL and triglycerides are elevated, she wants to try conservative management and recheck her lipids when she comes back in 2 months. 3. Chest pain - negative stress test in the past and her pain sounds atypical, no ischemic workup right now. 4. HLA B gene positivity - associated ankylosing spondylitis, we will check echocardiogram to evaluate for possible ascending aortic aneurysm and aortic regurgitation.  Medication  Adjustments/Labs and Tests Ordered: Current medicines are reviewed at length with the patient today.  Concerns regarding medicines are outlined above.  Medication changes, Labs and Tests ordered today are listed in the Patient Instructions below. Patient Instructions  Medication Instructions:   STOP TAKING TOPROL XL NOW  START TAKING BENICAR-HCTZ 20-12.5 MG ONCE DAILY    Labwork:  PRIOR TOO YOUR 2 MONTH FOLLOW-UP APPT WITH DR Meda Coffee TO CHECK--CMET AND LIPIDS--PLEASE COME FASTING TO THIS LAB APPOINTMENT     Testing/Procedures:  Your physician has requested that you have an echocardiogram. Echocardiography is a painless test that uses sound waves to create images of your heart. It provides your doctor with information about the size and shape of your heart and how well your heart's chambers and valves are working. This procedure takes approximately one hour. There are no restrictions for this procedure.    Follow-Up:  2 MONTHS WITH DR Dezmin Kittelson---PLEASE HAVE YOUR LABS DONE A WEEK PRIOR TOO THIS APPOINTMENT       If you need a refill on your cardiac medications before your next appointment, please call your pharmacy.      Signed, Ena Dawley, MD  09/21/2016 12:52 PM    [address], [place], [place]  [address] Phone: [phone]; Fax: [phone]

## 2016-09-22 ENCOUNTER — Telehealth: Payer: Self-pay

## 2016-09-22 NOTE — Telephone Encounter (Signed)
**Note De-Identified  Obfuscation** Approvedtoday  [id number];Review Type:Prior Auth;Coverage Start Date:08/23/2016;Coverage End Date:07/30/2098;  PA for Olmesartan Medoxomil-HCTZ has been approved from 08/23/16-07/30/98 from [employer].

## 2016-09-27 MED FILL — OLMESARTAN-HCTZ 20-12.5 MG: 20-12.5 | 30 days supply | Qty: 30 | Fill #0

## 2016-09-28 ENCOUNTER — Ambulatory Visit: Payer: Self-pay | Admitting: Family Medicine

## 2016-10-03 ENCOUNTER — Ambulatory Visit (INDEPENDENT_AMBULATORY_CARE_PROVIDER_SITE_OTHER): Payer: Medicare Other | Admitting: Family Medicine

## 2016-10-03 ENCOUNTER — Encounter: Payer: Self-pay | Admitting: Family Medicine

## 2016-10-03 VITALS — BP 118/82 | HR 78 | Ht 63.0 in | Wt 171.0 lb

## 2016-10-03 DIAGNOSIS — M24472 Recurrent dislocation, left ankle: Secondary | ICD-10-CM | POA: Diagnosis not present

## 2016-10-03 DIAGNOSIS — M25572 Pain in left ankle and joints of left foot: Secondary | ICD-10-CM | POA: Diagnosis not present

## 2016-10-03 NOTE — Progress Notes (Signed)
[name] [address], [place] [address] Phone: [phone] Subjective:    I'm seeing this patient by the request  of: Binnie Rail, MD    CC: left ankle pain f/u  [id number]  [name] is a 71 y.o. female coming in with complaint of Left ankle pain. Found to have a very small lateral avulsion fracture that seemed to be healing. Patient though did have a reactive peroneal tendinitis. Patient was to increase activity as tolerated as long as patient wearing a lateral stabilizing brace. Patient states She is making improvement. Has been wearing the brace daily. Patient has not been doing the exercises. Has had difficulty with this previously. Patient states that the swelling is improved. Mild discomfort on the contralateral ankle. No true injury though.     Past Medical History:  Diagnosis Date  . Allergy    seasonal  . Ankylosing spondylitis ([place])   . Anxiety   . Arthritis   . Bell palsy   . CTS (carpal tunnel syndrome)   . Cystitis   . Diverticulosis of colon   . Dry eye syndrome   . Frozen shoulder   . GERD (gastroesophageal reflux disease)   . HLA B27 (HLA B27 positive)   . Hx of colonic polyps   . Hyperlipidemia    NMR 2005  . Hypertension   . Hypothyroidism   . Incontinence   . Interstitial cystitis   . Kidney stone   . Menopause   . Osteoarthritis   . Osteopenia    [facility] [address]  . Osteopenia   . S/P total hysterectomy and bilateral salpingo-oophorectomy 04/12/2014  . Sleep apnea   . Subjective visual disturbance of both eyes 12/30/2013  . Vitamin D deficiency   . Xerostomia    and Xeroophthalmia   Past Surgical History:  Procedure Laterality Date  . ABDOMINAL HYSTERECTOMY    . BLADDER SURGERY     for incontinence  . CARPAL TUNNEL RELEASE Left 12/22/2014   Procedure: LEFT CARPAL TUNNEL RELEASE;  Surgeon: Daryll Brod, MD;  Location: [facility];  Service: Orthopedics;   Laterality: Left;  . COLONOSCOPY W/ POLYPECTOMY     adenomatous poyp 04-2008  . colonscopy     tics 10-2002  . CYSTOSCOPY WITH RETROGRADE PYELOGRAM, URETEROSCOPY AND STENT PLACEMENT Left 09/01/2013   Procedure: CYSTOSCOPY WITH RETROGRADE PYELOGRAM, AND LEFT STENT PLACEMENT;  Surgeon: Molli Hazard, MD;  [facility];  Service: Urology;  Laterality: Left;  . elevated LFTs     due to Elmiron  . FINGER SURGERY Left 2004   2nd finger  . G 1 P 1    . interstitial cystitis     with clot in catheter  . SHOULDER ARTHROSCOPY Left   . TOTAL ABDOMINAL HYSTERECTOMY W/ BILATERAL SALPINGOOPHORECTOMY     dysfunctional menses   Social History   Social History  . Marital status: Married    Spouse name: N/A  . Number of children: 1  . Years of education: BS   Occupational History  . retired   . teacher     Kindergarten   Social History Main Topics  . Smoking status: Never Smoker  . Smokeless tobacco: Never Used  . Alcohol use Yes     Comment: 1 glass of wine a years  . Drug use: No  . Sexual activity: Not Currently   Other Topics Concern  . None   Social History  Narrative   Married         Allergies  Allergen Reactions  . Nitroglycerin Anaphylaxis    Hypotensive after NTG administration in ER during chest pain evaluation  . Pentosan Polysulfate Sodium     Elevated LFTs.......Marland Kitchen elmiron   . Bactrim [Sulfamethoxazole-Trimethoprim]   . Promethazine Hcl     "jittery on the inside"  . Lisinopril Cough  . Sulfamethoxazole-Trimethoprim Nausea Only    Unknown reaction per pt   Family History  Problem Relation Age of Onset  . Coronary artery disease Mother   . Hypertension Mother   . Asthma Mother   . Osteoporosis Mother   . Dementia Mother   . Stroke Mother     mini cva  . Diabetes Father   . Cirrhosis Father     non alcoholic  . Cancer Sister     colon  . Colon cancer Sister 31  . Cancer Paternal Aunt     2 aunts had renal cancer  . Coronary artery disease  Maternal Grandfather   . Cancer Maternal Aunt     breast  . Diabetes Paternal Aunt   . Diabetes Paternal Grandmother     Past medical history, social, surgical and family history all reviewed in electronic medical record.  No pertanent information unless stated regarding to the chief complaint.   Review of Systems: No headache, visual changes, nausea, vomiting, diarrhea, constipation, dizziness, abdominal pain, skin rash, fevers, chills, night sweats, weight loss, swollen lymph nodes, chest pain, shortness of breath, mood changes.   Positive body ache and muscle pain  Objective  Blood pressure 118/82, pulse 78, height _0  (1.6 m), weight 171 lb (77.6 kg).   Systems examined below as of 10/03/16 General: NAD A&O x3 mood, affect normal  HEENT: Pupils equal, extraocular movements intact no nystagmus Respiratory: not short of breath at rest or with speaking Cardiovascular: No lower extremity edema, non tender Skin: Warm dry intact with no signs of infection or rash on extremities or on axial skeleton. Abdomen: Soft nontender, no masses Neuro: Cranial nerves  intact, neurovascularly intact in all extremities with 2+ DTRs and 2+ pulses. Lymph: No lymphadenopathy appreciated today   Gait improvement in gait MSK:  Non tender with full range of motion and good stability and symmetric strength and tone of shoulders, elbows, wrist, hip, knees bilaterally. Arthritic changes of multiple joints Ankle: Left Swelling has resolved Range of motion is full in all directions. Mild pain with resisted eversion still present but improved Strength is 5/5 in all directions. Once again mild pain over the eversion of the ankle Stable lateral and medial ligaments; squeeze test and kleiger test unremarkable; Talar dome nontender; No pain at base of 5th MT; No tenderness over cuboid; No tenderness over N spot or navicular prominence No tenderness on posterior aspects of lateral and medial malleolus No  subluxation of the peroneal tendons and very minimal tenderness to palpation.. Negative tarsal tunnel tinel's Able to walk 4 steps. Contralateral ankle unremarkable       Impression and Recommendations:     This case required medical decision making of moderate complexity.

## 2016-10-03 NOTE — Patient Instructions (Signed)
Good to see you  [name] is your friend.  Stay active.  Wear brace only with a lot of activity  PT will be calling you  See me again in 6 weeks.

## 2016-10-03 NOTE — Assessment & Plan Note (Signed)
Improvement at this time. Patient will start decreasing the wear the brace. Start with formal physical therapy with patient being noncompliant with the home exercises. I do believe the patient is an increased risk of having the same thing on the contralateral side. We discussed icing regimen and home exercises. Patient will increase activity as tolerated. Follow-up again in 6 weeks if not completely resolved.

## 2016-10-08 ENCOUNTER — Encounter: Payer: Self-pay | Admitting: Cardiology

## 2016-10-09 ENCOUNTER — Ambulatory Visit: Payer: Medicare Other | Attending: Family Medicine | Admitting: Physical Therapy

## 2016-10-09 DIAGNOSIS — R2689 Other abnormalities of gait and mobility: Secondary | ICD-10-CM | POA: Diagnosis not present

## 2016-10-09 DIAGNOSIS — R262 Difficulty in walking, not elsewhere classified: Secondary | ICD-10-CM | POA: Diagnosis not present

## 2016-10-09 DIAGNOSIS — M25572 Pain in left ankle and joints of left foot: Secondary | ICD-10-CM | POA: Diagnosis not present

## 2016-10-09 NOTE — Therapy (Signed)
[facility] [address]  [place], [place], [address] Phone: [phone]   Fax:  [phone]  Physical Therapy Evaluation  Patient Details  Name: [name] MRN: [id number] Date of Birth: 1945-11-12 Referring Provider: Hulan Saas, MD  Encounter Date: 10/09/2016      [name] End of Session - 10/09/16 1105    Visit Number 1   Number of Visits 12   Date for [name] Re-Evaluation 11/24/16   Authorization Type Medicare & Tricare - [name] only   [name] Start Time 1018   [name] Stop Time 1105   [name] Time Calculation (min) 47 min   Activity Tolerance Patient tolerated treatment well   Behavior During Therapy [facility] for tasks assessed/performed      Past Medical History:  Diagnosis Date  . Allergy    seasonal  . Ankylosing spondylitis ([place])   . Anxiety   . Arthritis   . Bell palsy   . CTS (carpal tunnel syndrome)   . Cystitis   . Diverticulosis of colon   . Dry eye syndrome   . Frozen shoulder   . GERD (gastroesophageal reflux disease)   . HLA B27 (HLA B27 positive)   . Hx of colonic polyps   . Hyperlipidemia    NMR 2005  . Hypertension   . Hypothyroidism   . Incontinence   . Interstitial cystitis   . Kidney stone   . Menopause   . Osteoarthritis   . Osteopenia    [facility] [address]  . Osteopenia   . S/P total hysterectomy and bilateral salpingo-oophorectomy 04/12/2014  . Sleep apnea   . Subjective visual disturbance of both eyes 12/30/2013  . Vitamin D deficiency   . Xerostomia    and Xeroophthalmia    Past Surgical History:  Procedure Laterality Date  . ABDOMINAL HYSTERECTOMY    . BLADDER SURGERY     for incontinence  . CARPAL TUNNEL RELEASE Left 12/22/2014   Procedure: LEFT CARPAL TUNNEL RELEASE;  Surgeon: Daryll Brod, MD;  Location: [facility];  Service: Orthopedics;  Laterality: Left;  . COLONOSCOPY W/ POLYPECTOMY     adenomatous poyp 04-2008  . colonscopy     tics 10-2002  .  CYSTOSCOPY WITH RETROGRADE PYELOGRAM, URETEROSCOPY AND STENT PLACEMENT Left 09/01/2013   Procedure: CYSTOSCOPY WITH RETROGRADE PYELOGRAM, AND LEFT STENT PLACEMENT;  Surgeon: Molli Hazard, MD;  [facility];  Service: Urology;  Laterality: Left;  . elevated LFTs     due to Elmiron  . FINGER SURGERY Left 2004   2nd finger  . G 1 P 1    . interstitial cystitis     with clot in catheter  . SHOULDER ARTHROSCOPY Left   . TOTAL ABDOMINAL HYSTERECTOMY W/ BILATERAL SALPINGOOPHORECTOMY     dysfunctional menses    There were no vitals filed for this visit.       Subjective Assessment - 10/09/16 1022    Subjective [name] reports she injured her L ankle when she tripped over a rolled rug on 09/09/16. [name] reports x-rays showed h/o avulsion fracture of undetermined age. Currently wearing air cast lateral ankle splint. Reports she has come to [name] due limited compliance with MD prescribed HEP and MD felt she would do better with more motivation.   Pertinent History chronic L shoulder pain due to OA & h/o frozen shoulder - needs TSA but unable to have shoulder    Diagnostic tests 09/11/16 L  ankle x-ray: Tiny avulsion fracture fragments noted along the distal aspect of the medial malleolus. Diffuse soft tissue swelling. 09/13/16 L ankle [place]: Peroneal tendinitis with overlying cyst and mild ankle effusion.   Patient Stated Goals "To learn how to do more exercises to make my ankle stronger so I don't reinjure it."   Currently in Pain? No/denies   Pain Score 0-No pain  least 0/10, avg/worst 5/10   Pain Location Ankle   Pain Orientation Left   Pain Descriptors / Indicators Sore   Pain Type Acute pain   Pain Onset More than a month ago   Pain Frequency Intermittent   Aggravating Factors  nothing specific other than maybe climbing stairs   Pain Relieving Factors massage, ice initially after injury   Effect of Pain on Daily Activities no changes in daily routine            [name] Assessment -  10/09/16 1018      Assessment   Medical Diagnosis L ankle pain   Referring Provider Hulan Saas, MD   Onset Date/Surgical Date 09/09/16   Next MD Visit 11/14/16   Prior Therapy therapy for L shoudler multiple times     Precautions   Required Braces or Orthoses Other Brace/Splint   Other Brace/Splint air cast lateral ankle splint     Balance Screen   Has the patient fallen in the past 6 months Yes   How many times? 1  at time of injury   Has the patient had a decrease in activity level because of a fear of falling?  No   Is the patient reluctant to leave their home because of a fear of falling?  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)   Leisure reading, music, TV, working in the yard     Observation/Other Assessments   Focus on Therapeutic Outcomes (FOTO)  Ankle 54% (46% limitation); predicted 63% (37% limitation)     Posture/Postural Control   Posture/Postural Control Postural limitations   Posture Comments pes planus     ROM / Strength   AROM / PROM / Strength AROM;PROM;Strength     AROM   AROM Assessment Site Ankle   Right/Left Ankle Right;Left   Right Ankle Dorsiflexion 19   Right Ankle Plantar Flexion 54   Right Ankle Inversion 32   Right Ankle Eversion 16   Left Ankle Dorsiflexion 14   Left Ankle Plantar Flexion 60   Left Ankle Inversion 32   Left Ankle Eversion 19     PROM   Overall PROM  Within functional limits for  tasks performed     Strength   Strength Assessment Site Hip;Knee;Ankle   Right/Left Hip Right;Left   Right Hip Flexion 4/5   Right Hip Extension 4-/5   Right Hip External Rotation  4-/5   Right Hip Internal Rotation 4-/5   Right Hip ABduction 4-/5   Right  Hip ADduction 3+/5   Left Hip Flexion 4/5   Left Hip Extension 4-/5   Left Hip External Rotation 4-/5   Left Hip Internal Rotation 4-/5   Left Hip ABduction 4/5   Left Hip ADduction 3+/5   Right/Left Knee Right;Left   Right Knee Flexion 4+/5   Right Knee Extension 5/5   Left Knee Flexion 4+/5   Left Knee Extension 5/5   Right/Left Ankle Right;Left   Right Ankle Dorsiflexion 4+/5   Right Ankle Plantar Flexion 5/5   Right Ankle Inversion 4+/5   Right Ankle Eversion 4+/5   Left Ankle Dorsiflexion 4/5   Left Ankle Plantar Flexion 5/5   Left Ankle Inversion 4-/5   Left Ankle Eversion 4-/5                           [name] Education - 10/09/16 1100    Education provided Yes   Education Details [name] eval findings & POC; [name] to bring MD prescribed HEP to next visit   Person(s) Educated Patient   Methods Explanation   Comprehension Verbalized understanding          [name] Short Term Goals - 10/09/16 1108      [name] SHORT TERM GOAL #1   Title Independent with initial HEP by 10/27/16   Status New           [name] Long Term Goals - 10/09/16 1108      [name] LONG TERM GOAL #1   Title Independent with advanced HEP as indicated by 11/24/16   Status New     [name] LONG TERM GOAL #2   Title L ankle strength >/= 4+/5 for improved ankle stability by 11/24/16   Status New     [name] LONG TERM GOAL #3   Title B proximal LE strength >/= 4/5 for improved balance by 11/24/16   Status New     [name] LONG TERM GOAL #4   Title [name] will ambulate with normal gait pattern on all surfaces w/o brace or limitation due to ankle pain or instability by 11/24/16   Status New               Plan - 10/09/16 1107    Clinical Impression  Statement [place] is 71 y/o female who presents to OP [name] for L ankle pain ~1 month s/p fall with L ankle sprain.  Imaging shortly following injury revealed tiny avulsion fracture fragments noted along the distal aspect of the medial malleolus and diffuse soft tissue swelling per x-ray and peroneal tendinitis with overlying cyst and mild ankle effusion per [place]. [name] denies pain at time of eval, but reports pain up to 5/10 intermittently, with pain typically of short duration. Assessment reveals mildly limited L ankle DF relative to R with mildly excessive PF ROM and inversion/eversion essentially equivalent R vs L.  L ankle mildy weaker than R with proximal LE weakness also noted. [name] continues to rely on L ankle air cast for ambulation and notes increased pain with stair negotiation. [name] demonstrates good potential to benefit from skilled [name] for L ankle strengthening and proprioceptive training along with proximal strengthening to restore normal function and gait pattern and reduce the risk for further injury, with manual therapy and modalities PRN for pain.   Rehab Potential Excellent   Clinical Impairments Affecting Rehab Potential chronic L shoulder pain   [name] Frequency 2x / week   [name] Duration 6 weeks   [name] Treatment/Interventions  Patient/family education;Therapeutic exercise;Neuromuscular re-education;Balance training;Therapeutic activities;Functional mobility training;Gait training;Stair training;Manual techniques;Taping;Electrical Stimulation;Cryotherapy;Vasopneumatic Device;Moist Heat;Ultrasound;Iontophoresis 31m/ml Dexamethasone;ADLs/Self Care Home Management   [name] Next Visit Plan Review MD prescribed HEP and update as indicated; L ankle ROM, strengthening & proprioceptive training; Manual therapy & modalities PRN   Consulted and Agree with Plan of Care Patient      Patient will benefit from skilled therapeutic intervention in order to improve the following deficits and impairments:  Decreased strength,  Decreased range of motion, Pain, Impaired flexibility, Decreased balance, Decreased activity tolerance  Visit Diagnosis: Pain in left ankle and joints of left foot  Other abnormalities of gait and mobility  Difficulty in walking, not elsewhere classified      G-Codes - [id number]    Functional Assessment Tool Used (Outpatient Only) Ankle FOTO = 54% (46% limitation)   Functional Limitation Mobility: Walking and moving around   Mobility: Walking and Moving Around Current Status (([id number] At least 40 percent but less than 60 percent impaired, limited or restricted   Mobility: Walking and Moving Around Goal Status [phone] At least 20 percent but less than 40 percent impaired, limited or restricted       Problem List Patient Active Problem List   Diagnosis Date Noted  . Chest pain 09/21/2016  . 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 Renal insufficiency 06/29/2016  . RUQ discomfort 06/29/2016  . Osteoporosis 06/21/2016  . Dizziness 05/16/2016  . Arthritis of left shoulder region 01/17/2016  . [place] 11/08/2015  . Incontinence 10/28/2015  . Thoracic back pain 10/19/2015  . Muscle cramps 09/29/2015  . Prediabetes 09/29/2015  . 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 Impingement syndrome of left shoulder region 09/22/2014  . Interstitial cystitis 07/09/2014  . Diverticulosis of colon without hemorrhage 07/08/2014  . Carpal tunnel syndrome of left wrist 04/12/2014  . Food allergy 03/06/2014  . Renal calculi 01/30/2014  . OSA (obstructive sleep apnea), moderate 01/14/2014  . Visual changes 12/17/2013  . Seasonal and perennial allergic rhinitis 12/06/2013  . Seasonal allergic conjunctivitis 12/06/2013  . Pneumonia, primary atypical 05/15/2013  . History of Bell's palsy 02/26/2012  . Vitamin D deficiency 06/27/2010  . 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  . DRY MOUTH  06/27/2010  . ABDOMINAL PAIN, SUPRAPUBIC 05/25/2009  . GERD 04/30/2009  . DIVERTICULOSIS, COLON 04/30/2009  . History of colonic polyps 04/30/2009  . COSTOCHONDRITIS 01/15/2008  . HYPERLIPIDEMIA 08/21/2007  . Essential hypertension 08/21/2007  . ARTHRALGIA 08/21/2007  . Chronic lumbar radiculopathy 01/25/2007    [name], [name] 304-05-2017 5:03 PM  [facility] [place] [address]Phone: [phone]  Fax:  [phone] Name: [name] SEANMRN: [id number] of Birth: 7Oct 18, 1947

## 2016-10-10 ENCOUNTER — Other Ambulatory Visit (HOSPITAL_COMMUNITY): Payer: Self-pay

## 2016-10-12 ENCOUNTER — Emergency Department (HOSPITAL_BASED_OUTPATIENT_CLINIC_OR_DEPARTMENT_OTHER)
Admission: EM | Admit: 2016-10-12 | Discharge: 2016-10-13 | Disposition: A | Discharge status: Home / Self Care | Payer: Medicare Other | Attending: Emergency Medicine | Admitting: Emergency Medicine

## 2016-10-12 ENCOUNTER — Encounter (HOSPITAL_BASED_OUTPATIENT_CLINIC_OR_DEPARTMENT_OTHER): Payer: Self-pay

## 2016-10-12 ENCOUNTER — Ambulatory Visit: Payer: Medicare Other | Admitting: Physical Therapy

## 2016-10-12 ENCOUNTER — Emergency Department (HOSPITAL_BASED_OUTPATIENT_CLINIC_OR_DEPARTMENT_OTHER): Payer: Medicare Other

## 2016-10-12 DIAGNOSIS — Z7982 Long term (current) use of aspirin: Secondary | ICD-10-CM | POA: Insufficient documentation

## 2016-10-12 DIAGNOSIS — R079 Chest pain, unspecified: Secondary | ICD-10-CM | POA: Diagnosis not present

## 2016-10-12 DIAGNOSIS — R262 Difficulty in walking, not elsewhere classified: Secondary | ICD-10-CM | POA: Diagnosis not present

## 2016-10-12 DIAGNOSIS — R2689 Other abnormalities of gait and mobility: Secondary | ICD-10-CM

## 2016-10-12 DIAGNOSIS — M25572 Pain in left ankle and joints of left foot: Secondary | ICD-10-CM

## 2016-10-12 DIAGNOSIS — E039 Hypothyroidism, unspecified: Secondary | ICD-10-CM | POA: Insufficient documentation

## 2016-10-12 DIAGNOSIS — I10 Essential (primary) hypertension: Secondary | ICD-10-CM | POA: Insufficient documentation

## 2016-10-12 DIAGNOSIS — R110 Nausea: Secondary | ICD-10-CM | POA: Insufficient documentation

## 2016-10-12 DIAGNOSIS — R0789 Other chest pain: Secondary | ICD-10-CM | POA: Diagnosis not present

## 2016-10-12 DIAGNOSIS — Z79899 Other long term (current) drug therapy: Secondary | ICD-10-CM | POA: Diagnosis not present

## 2016-10-12 DIAGNOSIS — R42 Dizziness and giddiness: Secondary | ICD-10-CM | POA: Insufficient documentation

## 2016-10-12 HISTORY — DX: Diverticulitis of intestine, part unspecified, without perforation or abscess without bleeding: K57.92

## 2016-10-12 LAB — CBC
HEMATOCRIT: 35.1 % — AB (ref 36.0–46.0)
Hemoglobin: 11.9 g/dL — ABNORMAL LOW (ref 12.0–15.0)
MCH: 30.2 pg (ref 26.0–34.0)
MCHC: 33.9 g/dL (ref 30.0–36.0)
MCV: 89.1 fL (ref 78.0–100.0)
PLATELETS: 237 10*3/uL (ref 150–400)
RBC: 3.94 MIL/uL (ref 3.87–5.11)
RDW: 12.5 % (ref 11.5–15.5)
WBC: 6.7 10*3/uL (ref 4.0–10.5)

## 2016-10-12 LAB — BASIC METABOLIC PANEL
Anion gap: 10 (ref 5–15)
BUN: 20 mg/dL (ref 6–20)
CALCIUM: 9.4 mg/dL (ref 8.9–10.3)
CO2: 23 mmol/L (ref 22–32)
Chloride: 103 mmol/L (ref 101–111)
Creatinine, Ser: 1.11 mg/dL — ABNORMAL HIGH (ref 0.44–1.00)
GFR calc Af Amer: 56 mL/min — ABNORMAL LOW (ref >60)
GFR, EST NON AFRICAN AMERICAN: 48 mL/min — AB (ref >60)
GLUCOSE: 142 mg/dL — AB (ref 65–99)
POTASSIUM: 3.4 mmol/L — AB (ref 3.5–5.1)
SODIUM: 136 mmol/L (ref 135–145)

## 2016-10-12 LAB — TROPONIN I

## 2016-10-12 NOTE — ED Notes (Signed)
Patient ambulatory to the restroom.

## 2016-10-12 NOTE — Therapy (Addendum)
[facility] [address]  [place], [place], [address] Phone: [phone]   Fax:  [phone]  Physical Therapy Treatment  Patient Details  Name: [name] MRN: [id number] Date of Birth: [birth year] Referring Provider: Hulan Saas, MD  Encounter Date: 10/12/2016      PT End of Session - 10/12/16 0845    Visit Number 2   Number of Visits 12   Date for PT Re-Evaluation 11/24/16   Authorization Type Medicare & Tricare - PT only   PT Start Time 0845   PT Stop Time 0931   PT Time Calculation (min) 46 min   Activity Tolerance Patient tolerated treatment well   Behavior During Therapy [facility] for tasks assessed/performed      Past Medical History:  Diagnosis Date  . Allergy    seasonal  . Ankylosing spondylitis ([place])   . Anxiety   . Arthritis   . Bell palsy   . CTS (carpal tunnel syndrome)   . Cystitis   . Diverticulosis of colon   . Dry eye syndrome   . Frozen shoulder   . GERD (gastroesophageal reflux disease)   . HLA B27 (HLA B27 positive)   . Hx of colonic polyps   . Hyperlipidemia    NMR 2005  . Hypertension   . Hypothyroidism   . Incontinence   . Interstitial cystitis   . Kidney stone   . Menopause   . Osteoarthritis   . Osteopenia    [facility] [address]  . Osteopenia   . S/P total hysterectomy and bilateral salpingo-oophorectomy 04/12/2014  . Sleep apnea   . Subjective visual disturbance of both eyes 12/30/2013  . Vitamin D deficiency   . Xerostomia    and Xeroophthalmia    Past Surgical History:  Procedure Laterality Date  . ABDOMINAL HYSTERECTOMY    . BLADDER SURGERY     for incontinence  . CARPAL TUNNEL RELEASE Left 12/22/2014   Procedure: LEFT CARPAL TUNNEL RELEASE;  Surgeon: Daryll Brod, MD;  Location: [facility];  Service: Orthopedics;  Laterality: Left;  . COLONOSCOPY W/ POLYPECTOMY     adenomatous poyp 04-2008  . colonscopy     tics 10-2002  .  CYSTOSCOPY WITH RETROGRADE PYELOGRAM, URETEROSCOPY AND STENT PLACEMENT Left 09/01/2013   Procedure: CYSTOSCOPY WITH RETROGRADE PYELOGRAM, AND LEFT STENT PLACEMENT;  Surgeon: Molli Hazard, MD;  [facility];  Service: Urology;  Laterality: Left;  . elevated LFTs     due to [place]  . FINGER SURGERY Left 2004   2nd finger  . G 1 P 1    . interstitial cystitis     with clot in catheter  . SHOULDER ARTHROSCOPY Left   . TOTAL ABDOMINAL HYSTERECTOMY W/ BILATERAL SALPINGOOPHORECTOMY     dysfunctional menses    There were no vitals filed for this visit.      Subjective Assessment - 10/12/16 0849    Subjective Pt w/o new issues or complaints since eval.   Pertinent History chronic L shoulder pain due to OA & h/o frozen shoulder - needs TSA but unable to have shoulder    Patient Stated Goals "To learn how to do more exercises to make my ankle stronger so I don't reinjure it."   Currently in Pain? No/denies   Pain Onset More than a month ago  [facility] Adult PT Treatment/Exercise - 10/12/16 0845      Exercises   Exercises Knee/Hip;Ankle     Knee/Hip Exercises: Supine   Bridges Both;10 reps   Bridges with Clamshell Both;10 reps  + hip ABD isometric with green TB   Other Supine Knee/Hip Exercises hooklying unilateral hip ABD/ER 10x3"     Knee/Hip Exercises: Sidelying   Clams B clam with green TB 10x3"     Ankle Exercises: Stretches   Soleus Stretch 30 seconds;2 reps   Soleus Stretch Limitations Lt - standing at wall    Gastroc Stretch 30 seconds;2 reps   Gastroc Stretch Limitations Lt - standing at wall     Ankle Exercises: Aerobic   Stationary Bike lvl 1 x 4'     Ankle Exercises: Standing   Heel Raises 10 reps;3 seconds   Heel Raises Limitations at back of chair     Ankle Exercises: Supine   T-Band Seated 4 way R ankle with red TB 2x10 reps each, 3" hold                PT Education - 10/12/16 0930    Education  provided Yes   Education Details HEP review and update   Person(s) Educated Patient   Methods Explanation;Demonstration;Handout   Comprehension Verbalized understanding;Returned demonstration;Need further instruction          PT Short Term Goals - 10/12/16 0850      PT SHORT TERM GOAL #1   Title Independent with initial HEP by 10/27/16   Status On-going           PT Long Term Goals - 10/12/16 0850      PT LONG TERM GOAL #1   Title Independent with advanced HEP as indicated by 11/24/16   Status On-going     PT LONG TERM GOAL #2   Title L ankle strength >/= 4+/5 for improved ankle stability by 11/24/16   Status On-going     PT LONG TERM GOAL #3   Title B proximal LE strength >/= 4/5 for improved balance by 11/24/16   Status On-going     PT LONG TERM GOAL #4   Title Pt will ambulate with normal gait pattern on all surfaces w/o brace or limitation due to ankle pain or instability by 11/24/16   Status On-going               Plan - 10/12/16 0845    Clinical Impression Statement Reviewed MD prescribed HEP and provided clarification or modification of technique as indicated with pt reporting increased ease of HEP completion after review. Added initial proximal strengthening and heelcord stretching with HEP updated accordingly.   Rehab Potential Excellent   Clinical Impairments Affecting Rehab Potential chronic L shoulder pain   PT Treatment/Interventions Patient/family education;Therapeutic exercise;Neuromuscular re-education;Balance training;Therapeutic activities;Functional mobility training;Gait training;Stair training;Manual techniques;Taping;Electrical Stimulation;Cryotherapy;Vasopneumatic Device;Moist Heat;Ultrasound;Iontophoresis 24m/ml Dexamethasone;ADLs/Self Care Home Management   PT Next Visit Plan L ankle ROM, strengthening & proprioceptive training; Proximal strengthening; Manual therapy & modalities PRN   Consulted and Agree with Plan of Care Patient       Patient will benefit from skilled therapeutic intervention in order to improve the following deficits and impairments:  Decreased strength, Decreased range of motion, Pain, Impaired flexibility, Decreased balance, Decreased activity tolerance  Visit Diagnosis: Pain in left ankle and joints of left foot  Other abnormalities of gait and mobility  Difficulty in walking, not elsewhere classified     Problem List Patient Active Problem List  Diagnosis Date Noted  . Chest pain 09/21/2016  . 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 Renal insufficiency 06/29/2016  . RUQ discomfort 06/29/2016  . Osteoporosis 06/21/2016  . Dizziness 05/16/2016  . Arthritis of left shoulder region 01/17/2016  . Ankylosing spondylitis ([place]) 11/08/2015  . Incontinence 10/28/2015  . Thoracic back pain 10/19/2015  . Muscle cramps 09/29/2015  . Prediabetes 09/29/2015  . 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 Impingement syndrome of left shoulder region 09/22/2014  . Interstitial cystitis 07/09/2014  . Diverticulosis of colon without hemorrhage 07/08/2014  . Carpal tunnel syndrome of left wrist 04/12/2014  . Food allergy 03/06/2014  . Renal calculi 01/30/2014  . OSA (obstructive sleep apnea), moderate 01/14/2014  . Visual changes 12/17/2013  . Seasonal and perennial allergic rhinitis 12/06/2013  . Seasonal allergic conjunctivitis 12/06/2013  . Pneumonia, primary atypical 05/15/2013  . History of Bell's palsy 02/26/2012  . Vitamin D deficiency 06/27/2010  . 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  . DRY MOUTH 06/27/2010  . ABDOMINAL PAIN, SUPRAPUBIC 05/25/2009  . GERD 04/30/2009  . DIVERTICULOSIS, COLON 04/30/2009  . History of colonic polyps 04/30/2009  . COSTOCHONDRITIS 01/15/2008  . HYPERLIPIDEMIA 08/21/2007  . Essential hypertension 08/21/2007  . ARTHRALGIA 08/21/2007  . Chronic lumbar radiculopathy  01/25/2007    Percival Spanish, PT, MPT 2016/10/19, 1:09 PM  [facility] [address]  [place], [place], [address] Phone: [phone]   Fax:  [phone]  Name: [name] MRN: [id number] Date of Birth: [birth year]  PHYSICAL THERAPY DISCHARGE SUMMARY  Visits from Start of Care: 2  Current functional level related to goals / functional outcomes:   Unable to formally assess as pt returned for only 1 visit following eval and has not returned in >30 days.   Remaining deficits:   As above.   Education / Equipment:   Initial HEP   G-Codes - 10-19-16    Functional Assessment Tool Used (Outpatient Only) Ankle FOTO = 54% (46% limitation)   Functional Limitation Mobility: Walking and moving around   Mobility: Walking and Moving Around Goal Status [phone]) At least 20 percent but less than 40 percent impaired, limited or restricted   Mobility: Walking and Moving Around Discharge Status [phone]) At least 40 percent but less than 60 percent impaired, limited or restricted   Plan: Patient agrees to discharge.  Patient goals were not met. Patient is being discharged due to not returning since the last visit.  ?????    Percival Spanish, PT, MPT 11/21/16, 10:14 AM  [facility] [place], [place], [address] Phone: [phone]   Fax:  [phone]

## 2016-10-12 NOTE — ED Triage Notes (Addendum)
c/o CP x today-pt states MD adjusting her BP meds x approx 2 weeks-pt NAD-steady gait

## 2016-10-12 NOTE — ED Notes (Signed)
Back from xray

## 2016-10-12 NOTE — ED Provider Notes (Signed)
[facility] DEPT [facility] Provider Note   CSN: [id number] Arrival date & time: 10/12/16  2124  By signing my name below, I, [name], attest that this documentation has been prepared under the direction and in the presence of [name] Decamp, PA-C.  Electronically Signed: Reola [name], ED Scribe. 10/12/16. 10:31 PM.  History   Chief Complaint Chief Complaint  Patient presents with  . Chest Pain   The history is provided by the patient. No language interpreter was used.    HPI Comments: [name] is a 71 y.o. female with a h/o HTN/HLD, who presents to the Emergency Department complaining of constant, waxing and waning left-sided chest pain beginning approximately 2.5 hours ago. Per pt, she was sitting tonight when she had an acute onset of lightheadedness and nausea. Both of these have resolved, however, shortly following she had a new onset of left-sided chest pain which she rates as 3-4/10. She describes the pain as sharp and her pain is non-exertional. Pt took 65m Asprin following the onset of her pain without relief. Per pt, she has experienced similar pain in the past but she is unsure of what this is related to. Additionally, she states her daily Olmesartan/HCTZ medications were recently altered by her Cardiologist during a visit on 09/21/16 and thinks her chest pain may be related to this. No recent medication changes otherwise. No Hx of PE/DVT, recent long travel, surgery, fracture, prolonged immobilization, or exogenous estrogen use. Per prior chart review, she also had a nuclear stress test last performed in 2014 for concerns of chest pain which showed LVEF of 70% and no prior infarct or ischemia. Pt additionally has an echocardiogram scheduled for 10/24/16. She denies vomiting, diaphoresis, cough, congestion, fever, abdominal pain, or any other associated symptoms.   PCP: SBinnie Rail MD Cardiologist: [name] Dawley MD   Past Medical History:  Diagnosis Date    . Allergy    seasonal  . [place]   . Anxiety   . Arthritis   . Bell palsy   . CTS (carpal tunnel syndrome)   . Cystitis   . Diverticulitis   . Diverticulosis of colon   . Dry eye syndrome   . Dry eye syndrome   . Frozen shoulder   . GERD (gastroesophageal reflux disease)   . HLA B27 (HLA B27 positive)   . Hx of colonic polyps   . Hyperlipidemia    NMR 2005  . Hypertension   . Hypothyroidism   . Incontinence   . Interstitial cystitis   . Kidney stone   . Menopause   . Osteoarthritis   . Osteopenia    [facility] [address]  . Osteopenia   . S/P total hysterectomy and bilateral salpingo-oophorectomy 04/12/2014  . Sleep apnea   . Subjective visual disturbance of both eyes 12/30/2013  . Vitamin D deficiency   . Xerostomia    and Xeroophthalmia   Patient Active Problem List   Diagnosis Date Noted  . Chest pain 09/21/2016  . 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 Renal insufficiency 06/29/2016  . RUQ discomfort 06/29/2016  . Osteoporosis 06/21/2016  . Dizziness 05/16/2016  . Arthritis of left shoulder region 01/17/2016  . [place]itis [place] 11/08/2015  . Incontinence 10/28/2015  . Thoracic back pain 10/19/2015  . Muscle cramps 09/29/2015  . Prediabetes 09/29/2015  . 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 Impingement syndrome  of left shoulder region 09/22/2014  . Interstitial cystitis 07/09/2014  . Diverticulosis of colon without hemorrhage 07/08/2014  . Carpal tunnel syndrome of left wrist 04/12/2014  . Food allergy 03/06/2014  . Renal calculi 01/30/2014  . OSA (obstructive sleep apnea), moderate 01/14/2014  . Visual changes 12/17/2013  . Seasonal and perennial allergic rhinitis 12/06/2013  . Seasonal allergic conjunctivitis 12/06/2013  . Pneumonia, primary atypical 05/15/2013  . History of Bell's palsy 02/26/2012  .  Vitamin D deficiency 06/27/2010  . 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  . DRY MOUTH 06/27/2010  . ABDOMINAL PAIN, SUPRAPUBIC 05/25/2009  . GERD 04/30/2009  . DIVERTICULOSIS, COLON 04/30/2009  . History of colonic polyps 04/30/2009  . COSTOCHONDRITIS 01/15/2008  . HYPERLIPIDEMIA 08/21/2007  . Essential hypertension 08/21/2007  . ARTHRALGIA 08/21/2007  . Chronic lumbar radiculopathy 01/25/2007   Past Surgical History:  Procedure Laterality Date  . ABDOMINAL HYSTERECTOMY    . BLADDER SURGERY     for incontinence  . CARPAL TUNNEL RELEASE Left 12/22/2014   Procedure: LEFT CARPAL TUNNEL RELEASE;  Surgeon: Daryll Brod, MD;  Location: [facility];  Service: Orthopedics;  Laterality: Left;  . COLONOSCOPY W/ POLYPECTOMY     adenomatous poyp 04-2008  . colonscopy     tics 10-2002  . CYSTOSCOPY WITH RETROGRADE PYELOGRAM, URETEROSCOPY AND STENT PLACEMENT Left 09/01/2013   Procedure: CYSTOSCOPY WITH RETROGRADE PYELOGRAM, AND LEFT STENT PLACEMENT;  Surgeon: Molli Hazard, MD;  Location: [facility];  Service: Urology;  Laterality: Left;  . elevated LFTs     due to [place]  . FINGER SURGERY Left 2004   2nd finger  . G 1 P 1    . interstitial cystitis     with clot in catheter  . SHOULDER ARTHROSCOPY Left   . TOTAL ABDOMINAL HYSTERECTOMY W/ BILATERAL SALPINGOOPHORECTOMY     dysfunctional menses   OB History    No data available     Home Medications    Prior to Admission medications   Medication Sig Start Date End Date Taking? Authorizing Provider  acetaminophen (TYLENOL) 500 MG tablet Take by mouth.    Historical Provider, MD  Ascorbic Acid (VITAMIN C) 1000 MG tablet Take by mouth.    Historical Provider, MD  aspirin 81 MG tablet Take 81 mg by mouth daily.      Historical Provider, MD  blood glucose meter kit and supplies KIT Dispense based on patient and insurance preference. Use up to four times daily as directed. prediabetes 10/28/15   [name] Rail, MD  Calcium Carbonate-Vit  D-Min (CALCIUM 1200 PO) Take 1,200 mg by mouth 2 (two) times daily.    Historical Provider, MD  cetirizine (ZYRTEC) 10 MG tablet Take 10 mg by mouth daily.      Historical Provider, MD  Cholecalciferol (VITAMIN D3) 5000 units TABS Take 5,000 mg by mouth daily.    Historical Provider, MD  Diclofenac Sodium 2 % SOLN Place onto the skin as directed for pain as needed.    Historical Provider, MD  meloxicam (MOBIC) 15 MG tablet Take 1 tablet (15 mg total) by mouth daily as needed for pain. With food 09/11/16   Flossie Buffy, NP  Misc Natural Products (COSAMIN ASU ADVANCED FORMULA PO) Take 2 each by mouth daily.    Historical Provider, MD  olmesartan-hydrochlorothiazide (BENICAR HCT) 20-12.5 MG tablet Take 1 tablet by mouth daily. 09/21/16   Dorothy Spark, MD  Omega-3 Fatty Acids (FISH OIL BURP-LESS) 1000 MG CAPS Takes 1000 mg BID  01/03/16   Amy S Esterwood, PA-C  OSCIMIN 0.125 MG SL tablet  08/12/15   Historical Provider, MD  Polyethyl Glycol-Propyl Glycol (SYSTANE OP) Apply 1 drop to eye as needed. Apply to both eyes as needed    Historical Provider, MD  vitamin B-12 (CYANOCOBALAMIN) 1000 MCG tablet Take by mouth.    Historical Provider, MD  vitamin E 400 UNIT capsule Take 400 Units by mouth daily.    Historical Provider, MD   Family History Family History  Problem Relation Age of Onset  . Coronary artery disease Mother   . Hypertension Mother   . Asthma Mother   . Osteoporosis Mother   . Dementia Mother   . Stroke Mother     mini cva  . Diabetes Father   . Cirrhosis Father     non alcoholic  . Cancer Sister     colon  . Colon cancer Sister 46  . Cancer Paternal Aunt     2 aunts had renal cancer  . Coronary artery disease Maternal Grandfather   . Cancer Maternal Aunt     breast  . Diabetes Paternal Aunt   . Diabetes Paternal Grandmother    Social History Social History  Substance Use Topics  . Smoking status: Never Smoker  . Smokeless tobacco: Never Used  . Alcohol use Yes       Comment: occ   Allergies   Nitroglycerin; Pentosan polysulfate sodium; Bactrim [sulfamethoxazole-trimethoprim]; Promethazine hcl; Lisinopril; and Sulfamethoxazole-trimethoprim  Review of Systems Review of Systems  Constitutional: Negative for diaphoresis and fever.  Cardiovascular: Positive for chest pain.  Gastrointestinal: Positive for nausea. Negative for abdominal pain and vomiting.  Neurological: Positive for light-headedness.  All other systems reviewed and are negative.  Physical Exam Updated Vital Signs BP (!) 156/91 (BP Location: Left Arm)   Pulse 76   Temp 98.3 F (36.8 C) (Oral)   Resp 20   SpO2 100%   Physical Exam  Constitutional: She is oriented to person, place, and time. She appears well-developed and well-nourished. No distress.  HENT:  Head: Normocephalic and atraumatic.  Right Ear: Tympanic membrane, external ear and ear canal normal.  Left Ear: Tympanic membrane, external ear and ear canal normal.  Nose: Nose normal.  Mouth/Throat: Uvula is midline, oropharynx is clear and moist and mucous membranes are normal. No trismus in the jaw. No oropharyngeal exudate, posterior oropharyngeal erythema or tonsillar abscesses.  Eyes: Conjunctivae and EOM are normal. Pupils are equal, round, and reactive to light.  Neck: Normal range of motion. Neck supple. No tracheal deviation present.  Cardiovascular: Normal rate, regular rhythm, S1 normal, S2 normal, normal heart sounds, intact distal pulses and normal pulses.   No murmur heard. Pulmonary/Chest: Effort normal and breath sounds normal. No respiratory distress. She has no decreased breath sounds. She has no wheezes. She has no rhonchi. She has no rales. She exhibits tenderness.  Mild TTP to left anterior chest wall.   Abdominal: Soft. Normal appearance and bowel sounds are normal. She exhibits no distension. There is no tenderness.  Musculoskeletal: Normal range of motion.  Neurological: She is alert and oriented  to person, place, and time.  Skin: Skin is warm and dry. No pallor.  Psychiatric: She has a normal mood and affect. Her speech is normal and behavior is normal. Thought content normal.  Nursing note and vitals reviewed.  ED Treatments / Results  DIAGNOSTIC STUDIES: Oxygen Saturation is 100% on RA, normal by my interpretation.   COORDINATION OF CARE: 10:27  PM-Discussed next steps with pt. Pt verbalized understanding and is agreeable with the plan.   Labs (all labs ordered are listed, but only abnormal results are displayed) Labs Reviewed  BASIC METABOLIC PANEL - Abnormal; Notable for the following:       Result Value   Potassium 3.4 (*)    Glucose, Bld 142 (*)    Creatinine, Ser 1.11 (*)    GFR calc non Af Amer 48 (*)    GFR calc Af Amer 56 (*)    All other components within normal limits  CBC - Abnormal; Notable for the following:    Hemoglobin 11.9 (*)    HCT 35.1 (*)    All other components within normal limits  TROPONIN I  TROPONIN I   EKG  EKG Interpretation  Date/Time:  Thursday October 12 2016 21:29:53 EDT Ventricular Rate:  76 PR Interval:  168 QRS Duration: 88 QT Interval:  398 QTC Calculation: 447 R Axis:   -56 Text Interpretation:  Normal sinus rhythm Left anterior fascicular block Cannot rule out Anterior infarct , age undetermined Abnormal ECG bradycardia improved from previous, no other significant change Confirmed by LITTLE MD, RACHEL [phone]) on 10/12/2016 10:23:37 PM      Radiology Dg Chest 2 View  Result Date: 10/12/2016 CLINICAL DATA:  Generalized chest pain for 3 hours. History of hypertension and GE reflux disease. EXAM: CHEST  2 VIEW COMPARISON:  05/26/2013 FINDINGS: Motion artifact limits evaluation. Heart size and pulmonary vascularity are normal. No focal airspace disease or consolidation in the lungs. No blunting of costophrenic angles. No pneumothorax. Tortuous aorta. Degenerative changes in the spine. IMPRESSION: No active cardiopulmonary  disease. Electronically Signed   By: Lucienne Capers M.D.   On: 10/12/2016 23:14    Procedures Procedures   Medications Ordered in ED Medications - No data to display  Initial Impression / Assessment and Plan / ED Course  I have reviewed the triage vital signs and the nursing notes.  Pertinent labs & imaging results that were available during my care of the patient were reviewed by me and considered in my medical decision making (see chart for details).  I have reviewed and evaluated the relevant laboratory values. I have reviewed and evaluated the relevant imaging studies. I have interpreted the relevant EKG. I have reviewed the relevant previous healthcare records. I obtained HPI from historian. Patient discussed with supervising physician  ED Course: cardiac r/o   Assessment: Pt is a 71 y.o. female presents with left sided CP with onset 2.5 hours ago. Pressure sensation. Hx same. No diaphoresis. No emesis. Mild nausea. Seen by Cardiology for same on 09-21-16. Did not feel chest pain was of cardiac etiology. Pain is atypical with negative stress and no ischemic work up at that time. Pt believes this sensation is related to BP medication change recently from Toprol to Olmesartan/HCTZ. Patient is to be discharged with recommendation to follow up with PCP in regards to today's hospital visit. Chest pain is not likely of cardiac or pulmonary etiology d/t presentation, perc negative, VSS, no tracheal deviation, no JVD or new murmur, RRR, breath sounds equal bilaterally, EKG without acute abnormalities, negative troponin x2, and negative CXR. Pt has been advised start anti inflammatories and return to the ED is CP becomes exertional, associated with diaphoresis or nausea, radiates to left jaw/arm, worsens or becomes concerning in any way. Pt appears reliable for follow up and is agreeable to discharge. Patient is in no acute distress. Vital Signs are stable. Patient  is able to ambulate. Patient able  to tolerate PO.    Disposition/Plan:  DC Home Additional Verbal discharge instructions given and discussed with patient.  Pt Instructed to f/u with PCP in the next week for evaluation and treatment of symptoms. Return precautions given Pt acknowledges and agrees with plan  Supervising Physician Sharlett Iles, MD  Final Clinical Impressions(s) / ED Diagnoses   Final diagnoses:  Chest pain, unspecified type   New Prescriptions New Prescriptions   No medications on file   I personally performed the services described in this documentation, which was scribed in my presence. The recorded information has been reviewed and is accurate.    [name] Decamp, PA-C 10/13/16 [place], MD 10/13/16 [phone]

## 2016-10-13 ENCOUNTER — Telehealth: Payer: Self-pay | Admitting: Cardiology

## 2016-10-13 DIAGNOSIS — R0789 Other chest pain: Secondary | ICD-10-CM | POA: Diagnosis not present

## 2016-10-13 LAB — TROPONIN I

## 2016-10-13 MED ORDER — METHOCARBAMOL 500 MG PO TABS: 500.0000 mg | ORAL_TABLET | Freq: Four times a day (QID) | ORAL | 0 refills | Status: DC | PRN

## 2016-10-13 MED FILL — METHOCARBAMOL 500 MG TABLET: 500 | 5 days supply | Qty: 20 | Fill #0

## 2016-10-13 NOTE — Telephone Encounter (Signed)
[name], can you schedule earlier? I will potentially have an added clinic on Tuesday 10/17/16.

## 2016-10-13 NOTE — Telephone Encounter (Signed)
Patient was in ED yesterday and was sent home after all her testing. ED doctor advised, "Pt Instructed to f/u with PCP in the next week for evaluation and treatment of symptoms." Informed patient of follow up as advised. Patient has appointment with her PCP on 10/23/16. Patient was not sure if she needed to see Dr. Meda Coffee sooner than 11/29/16. Patient is having an echo on 10/24/16. Informed patient to keep her appointment for echo. Will forward to Dr. Meda Coffee to review chart and see if patient needs to be seen earlier. Patient agree to plan.

## 2016-10-13 NOTE — Discharge Instructions (Signed)
Please read and follow all provided instructions.  Your diagnoses today include:  1. Chest pain, unspecified type     Tests performed today include: An EKG of your heart A chest x-ray Cardiac enzymes - a blood test for heart muscle damage Blood counts and electrolytes Vital signs. See below for your results today.   Medications prescribed:   Take any prescribed medications only as directed.  Follow-up instructions: Please follow-up with your primary care provider as soon as you can for further evaluation of your symptoms.   Return instructions:  SEEK IMMEDIATE MEDICAL ATTENTION IF: You have severe chest pain, especially if the pain is crushing or pressure-like and spreads to the arms, back, neck, or jaw, or if you have sweating, nausea (feeling sick to your stomach), or shortness of breath. THIS IS AN EMERGENCY. Don't wait to see if the pain will go away. Get medical help at once. Call 911 or [phone] (operator). DO NOT drive yourself to the hospital.  Your chest pain gets worse and does not go away with rest.  You have an attack of chest pain lasting longer than usual, despite rest and treatment with the medications your caregiver has prescribed.  You wake from sleep with chest pain or shortness of breath. You feel dizzy or faint. You have chest pain not typical of your usual pain for which you originally saw your caregiver.  You have any other emergent concerns regarding your health.  Additional Information: Chest pain comes from many different causes. Your caregiver has diagnosed you as having chest pain that is not specific for one problem, but does not require admission.  You are at low risk for an acute heart condition or other serious illness.   Your vital signs today were: BP 121/66 (BP Location: Right Arm)    Pulse 77    Temp 98.3 F (36.8 C) (Oral)    Resp 19    SpO2 97%  If your blood pressure (BP) was elevated above 135/85 this visit, please have this repeated by your doctor  within one month. --------------

## 2016-10-13 NOTE — Telephone Encounter (Signed)
New Message  Pt call requesting to speak with RN. Pt states she was released from the hospital and wanted to f/u. Pt does not think she needs a f/u appt. But would like to speak with RN to be sure. Please call back to discuss

## 2016-10-16 ENCOUNTER — Encounter: Payer: Self-pay | Admitting: *Deleted

## 2016-10-16 DIAGNOSIS — M8588 Other specified disorders of bone density and structure, other site: Secondary | ICD-10-CM | POA: Diagnosis not present

## 2016-10-16 DIAGNOSIS — M810 Age-related osteoporosis without current pathological fracture: Secondary | ICD-10-CM | POA: Diagnosis not present

## 2016-10-16 DIAGNOSIS — N958 Other specified menopausal and perimenopausal disorders: Secondary | ICD-10-CM | POA: Diagnosis not present

## 2016-10-16 DIAGNOSIS — M816 Localized osteoporosis [Lequesne]: Secondary | ICD-10-CM | POA: Diagnosis not present

## 2016-10-17 ENCOUNTER — Ambulatory Visit (INDEPENDENT_AMBULATORY_CARE_PROVIDER_SITE_OTHER): Payer: Medicare Other | Admitting: Cardiology

## 2016-10-17 ENCOUNTER — Encounter: Payer: Self-pay | Admitting: Cardiology

## 2016-10-17 VITALS — BP 124/86 | HR 81 | Ht 62.0 in | Wt 172.0 lb

## 2016-10-17 DIAGNOSIS — I10 Essential (primary) hypertension: Secondary | ICD-10-CM

## 2016-10-17 DIAGNOSIS — Z1589 Genetic susceptibility to other disease: Secondary | ICD-10-CM

## 2016-10-17 DIAGNOSIS — M810 Age-related osteoporosis without current pathological fracture: Secondary | ICD-10-CM | POA: Diagnosis not present

## 2016-10-17 DIAGNOSIS — M150 Primary generalized (osteo)arthritis: Secondary | ICD-10-CM | POA: Diagnosis not present

## 2016-10-17 DIAGNOSIS — R0609 Other forms of dyspnea: Secondary | ICD-10-CM | POA: Diagnosis not present

## 2016-10-17 DIAGNOSIS — E669 Obesity, unspecified: Secondary | ICD-10-CM | POA: Diagnosis not present

## 2016-10-17 DIAGNOSIS — R072 Precordial pain: Secondary | ICD-10-CM

## 2016-10-17 DIAGNOSIS — M2550 Pain in unspecified joint: Secondary | ICD-10-CM | POA: Diagnosis not present

## 2016-10-17 DIAGNOSIS — R0600 Dyspnea, unspecified: Secondary | ICD-10-CM

## 2016-10-17 DIAGNOSIS — Z6831 Body mass index (BMI) 31.0-31.9, adult: Secondary | ICD-10-CM | POA: Diagnosis not present

## 2016-10-17 MED ORDER — METOPROLOL SUCCINATE ER 25 MG PO TB24: 25.0000 mg | ORAL_TABLET | Freq: Every day | ORAL | 3 refills | Status: DC

## 2016-10-17 MED ORDER — HYDROCHLOROTHIAZIDE 12.5 MG PO CAPS: 12.5000 mg | ORAL_CAPSULE | Freq: Every day | ORAL | 3 refills | Status: DC

## 2016-10-17 MED FILL — METOPROLOL SUCC ER 25 MG TA: 25 | 90 days supply | Qty: 90 | Fill #0

## 2016-10-17 MED FILL — HYDROCHLOROTHIAZIDE 12.5 MG: 12.5 | 90 days supply | Qty: 90 | Fill #0

## 2016-10-17 NOTE — Telephone Encounter (Signed)
Called to speak with the pt and Daughter [name] (on [place]) answered and informed that the pt is currently at her PCP appt, as advised on discharge from the ER yesterday.  Informed the pts Daughter that Dr Meda Coffee wanted me to add her in to be seen this afternoon, for follow-up from ER visit.  Offered the pts Daughter an appt for the pt to see Dr Meda Coffee today at 3:45 pm.  Per the pts Daughter, she agreed to this time and stated that she will endorse this to the pt, upon returning home from her PCP appt.  Daughter stated that she will call the office back, if this appt time is of conflict.  Daughter gracious for all the assistance provided.

## 2016-10-17 NOTE — Patient Instructions (Signed)
Medication Instructions:   STOP TAKING BENICAR NOW  START TAKING TOPROL XL 25 MG ONCE DAILY  START TAKING HYDROCHLOROTHIAZIDE 12.5 MG ONCE DAILY     Testing/Procedures:  CORONARY CTA FOR DR NELSON TO READ      Follow-Up:  Your physician wants you to follow-up in: [place] will receive a reminder letter in the mail two months in advance. If you don't receive a letter, please call our office to schedule the follow-up appointment.        If you need a refill on your cardiac medications before your next appointment, please call your pharmacy.

## 2016-10-17 NOTE — Progress Notes (Signed)
Cardiology Office Note    Date:  10/17/2016   ID:  [name], [name] [birth year], MRN [id number]  PCP:  Binnie Rail, MD  Cardiologist:  Ena Dawley, MD   Chief complain: chest pain  History of Present Illness:  [name] is a 71 y.o. female with prior history of hypertension, hyperlipidemia who was previously seen in our office in 2014 for concerns of chest pain and underwent an exercise nuclear stress test that showed LVEF of 70% and no prior infarct or ischemia. She is coming 3 years later for occasional left-sided chest pain not related to exertion. She has been treated with Toprol for high blood pressure. She complains of fatigue and occasional dizziness. She also has occasional lower extremity edema. Denies any orthopnea or proximal nocturnal dyspnea. 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 She has never had an echocardiogram to evaluate for aortic regurgitation or ascending aortic aneurysm. She denies any palpitations or syncope.  10/17/2016 - the patient is coming after 4 weeks, she has visited ER on 10/12/16 with dizziness and chest pain and was discharged home after ACS was ruled out. She states that she feels dizzy ever since she started Benicar. She brings her BP diary and BP in the range 96-150, but most in 120-130.  She has also been experiencing DOE after walking 1 flight of stair associated with left sided chest pain that is pressure like and has no radiation. She has occassional LE edema.    Past Medical History:  Diagnosis Date  . Allergy    seasonal  . Ankylosing spondylitis ([place])   . Anxiety   . Arthritis   . Bell palsy   . CTS (carpal tunnel syndrome)   . Cystitis   . Diverticulitis   . Diverticulosis of colon   . Dry eye syndrome   . Frozen shoulder   . GERD (gastroesophageal reflux disease)   . HLA B27 (HLA B27 positive)   . Hx of colonic polyps   . Hyperlipidemia    NMR 2005  . Hypertension     . Hypothyroidism   . Incontinence   . Internal hemorrhoids   . Interstitial cystitis   . Kidney stone   . Menopause   . Osteoarthritis   . Osteopenia    [facility]  . S/P total hysterectomy and bilateral salpingo-oophorectomy 04/12/2014  . Sleep apnea   . Subjective visual disturbance of both eyes 12/30/2013  . Tubular adenoma of colon   . Vitamin D deficiency   . Xerostomia    and Xeroophthalmia    Past Surgical History:  Procedure Laterality Date  . ABDOMINAL HYSTERECTOMY    . BLADDER SURGERY     for incontinence  . CARPAL TUNNEL RELEASE Left 12/22/2014   Procedure: LEFT CARPAL TUNNEL RELEASE;  Surgeon: Daryll Brod, MD;  Location: [facility];  Service: Orthopedics;  Laterality: Left;  . COLONOSCOPY W/ POLYPECTOMY     adenomatous poyp 04-2008  . colonscopy     tics 10-2002  . CYSTOSCOPY WITH RETROGRADE PYELOGRAM, URETEROSCOPY AND STENT PLACEMENT Left 09/01/2013   Procedure: CYSTOSCOPY WITH RETROGRADE PYELOGRAM, AND LEFT STENT PLACEMENT;  Surgeon: Molli Hazard, MD;  Location: [facility];  Service: Urology;  Laterality: Left;  . elevated LFTs     due to Elmiron  . FINGER SURGERY Left 2004   2nd finger  . G 1 P 1    .  interstitial cystitis     with clot in catheter  . SHOULDER ARTHROSCOPY Left   . TOTAL ABDOMINAL HYSTERECTOMY W/ BILATERAL SALPINGOOPHORECTOMY     dysfunctional menses    Current Medications: Outpatient Medications Prior to Visit  Medication Sig Dispense Refill  . acetaminophen (TYLENOL) 500 MG tablet Take by mouth.    . Ascorbic Acid (VITAMIN C) 1000 MG tablet Take by mouth.    Marland Kitchen aspirin 81 MG tablet Take 81 mg by mouth daily.      . blood glucose meter kit and supplies KIT Dispense based on patient and insurance preference. Use up to four times daily as directed. prediabetes 1 each 0  . Calcium Carbonate-Vit D-Min (CALCIUM 1200 PO) Take 1,200 mg by mouth 2 (two) times daily.    . cetirizine (ZYRTEC) 10 MG tablet  Take 10 mg by mouth daily.      . Cholecalciferol (VITAMIN D3) 5000 units TABS Take 5,000 mg by mouth daily.    . Diclofenac Sodium 2 % SOLN Place onto the skin as directed for pain as needed.    . meloxicam (MOBIC) 15 MG tablet Take 1 tablet (15 mg total) by mouth daily as needed for pain. With food 90 tablet 2  . methocarbamol (ROBAXIN) 500 MG tablet Take 1 tablet (500 mg total) by mouth every 6 (six) hours as needed for muscle spasms. 20 tablet 0  . Misc Natural Products (COSAMIN ASU ADVANCED FORMULA PO) Take 2 each by mouth daily.    . Omega-3 Fatty Acids (FISH OIL BURP-LESS) 1000 MG CAPS Takes 1000 mg BID 60 capsule 0  . OSCIMIN 0.125 MG SL tablet     . Polyethyl Glycol-Propyl Glycol (SYSTANE OP) Apply 1 drop to eye as needed. Apply to both eyes as needed    . vitamin B-12 (CYANOCOBALAMIN) 1000 MCG tablet Take by mouth.    . vitamin E 400 UNIT capsule Take 400 Units by mouth daily.    Marland Kitchen olmesartan-hydrochlorothiazide (BENICAR HCT) 20-12.5 MG tablet Take 1 tablet by mouth daily. 30 tablet 2   No facility-administered medications prior to visit.      Allergies:   Nitroglycerin; Pentosan polysulfate sodium; Bactrim [sulfamethoxazole-trimethoprim]; Promethazine hcl; Lisinopril; and Sulfamethoxazole-trimethoprim   Social History   Social History  . Marital status: Married    Spouse name: N/A  . Number of children: 1  . Years of education: BS   Occupational History  . retired   . teacher     Kindergarten   Social History Main Topics  . Smoking status: Never Smoker  . Smokeless tobacco: Never Used  . Alcohol use Yes     Comment: occ  . Drug use: No  . Sexual activity: Not Currently   Other Topics Concern  . None   Social History Narrative   Married           Family History:  The patient's family history includes Asthma in her mother; Cancer in her maternal aunt, paternal aunt, and sister; Cirrhosis in her father; Colon cancer (age of onset: 28) in her sister; Coronary  artery disease in her maternal grandfather and mother; Dementia in her mother; Diabetes in her father, paternal aunt, and paternal grandmother; Hypertension in her mother; Osteoporosis in her mother; Stroke in her mother.   ROS:   Please see the history of present illness.    ROS All other systems reviewed and are negative.   PHYSICAL EXAM:   VS:  BP 124/86   Pulse 81  Ht 5' 2" (1.575 m)   Wt 172 lb (78 kg)   SpO2 98%   BMI 31.46 kg/m    GEN: Well nourished, well developed, in no acute distress  HEENT: normal  Neck: no JVD, carotid bruits, or masses Cardiac: RRR; no murmurs, rubs, or gallops,no edema  Respiratory:  clear to auscultation bilaterally, normal work of breathing GI: soft, nontender, nondistended, + BS MS: no deformity or atrophy  Skin: warm and dry, no rash Neuro:  Alert and Oriented x 3, Strength and sensation are intact Psych: euthymic mood, full affect  Wt Readings from Last 3 Encounters:  10/17/16 172 lb (78 kg)  10/03/16 171 lb (77.6 kg)  09/21/16 173 lb (78.5 kg)      Studies/Labs Reviewed:   EKG:  EKG is ordered today.  The ekg ordered today demonstrates sinus bradycardia, 56 bpm, left axis deviation, nonspecific T wave abnormalities.  Recent Labs: 05/02/2016: ALT 26; TSH 3.87 10/12/2016: BUN 20; Creatinine, Ser 1.11; Hemoglobin 11.9; Platelets 237; Potassium 3.4; Sodium 136   Lipid Panel    Component Value Date/Time   CHOL 193 05/02/2016 0919   TRIG 206.0 (H) 05/02/2016 0919   TRIG 181 (H) 07/23/2006 1101   HDL 57.20 05/02/2016 0919   CHOLHDL 3 05/02/2016 0919   VLDL 41.2 (H) 05/02/2016 0919   LDLCALC 98 06/03/2015 1128   LDLDIRECT 114.0 05/02/2016 0919   Additional studies/ records that were reviewed today include:   ECG performed today was personally reviewed and showed NSR, LAD, poor R wave progression in the anterior leads, unchanged from prior   ASSESSMENT:    1. Essential hypertension   2. Precordial pain   3. DOE (dyspnea on  exertion)   4. HLA B27 (HLA B27 positive)     PLAN:  In order of problems listed above:  1. Hypertension - she tolearted Toprol better, we will d/c Benicar and restart Toprol XL but 25 mg po daily. We will check an echocardiogram to evaluate for degree of LVH. 2. Hyperlipidemia - patient's LDL and triglycerides are elevated, she wants to try conservative management and recheck her lipids when she comes back in 2 months. 3. Chest pain - negative stress test in the past, we will schedule a calcium score and coronary CTA 4. HLA B gene positivity - associated ankylosing spondylitis, we will check echocardiogram to evaluate for possible ascending aortic aneurysm and aortic regurgitation.  Medication Adjustments/Labs and Tests Ordered: Current medicines are reviewed at length with the patient today.  Concerns regarding medicines are outlined above.  Medication changes, Labs and Tests ordered today are listed in the Patient Instructions below. Patient Instructions  Medication Instructions:   STOP TAKING BENICAR NOW  START TAKING TOPROL XL 25 MG ONCE DAILY  START TAKING HYDROCHLOROTHIAZIDE 12.5 MG ONCE DAILY     Testing/Procedures:  CORONARY CTA FOR DR Dewayne Severe TO READ      Follow-Up:  Your physician wants you to follow-up in: [place] will receive a reminder letter in the mail two months in advance. If you don't receive a letter, please call our office to schedule the follow-up appointment.        If you need a refill on your cardiac medications before your next appointment, please call your pharmacy.      Signed, Ena Dawley, MD  10/17/2016 10:42 PM    [address], [place], [place]  [address] Phone: [phone]; Fax: [phone]

## 2016-10-18 ENCOUNTER — Ambulatory Visit: Payer: Medicare Other | Admitting: Physical Therapy

## 2016-10-20 ENCOUNTER — Ambulatory Visit: Payer: Medicare Other | Admitting: Physical Therapy

## 2016-10-23 ENCOUNTER — Encounter: Payer: Self-pay | Admitting: Cardiology

## 2016-10-23 ENCOUNTER — Inpatient Hospital Stay: Payer: Self-pay | Admitting: Internal Medicine

## 2016-10-24 ENCOUNTER — Other Ambulatory Visit: Payer: Self-pay

## 2016-10-24 ENCOUNTER — Telehealth: Payer: Self-pay | Admitting: *Deleted

## 2016-10-24 ENCOUNTER — Ambulatory Visit: Payer: Medicare Other | Admitting: Physical Therapy

## 2016-10-24 ENCOUNTER — Ambulatory Visit (HOSPITAL_COMMUNITY): Payer: Medicare Other | Attending: Cardiology

## 2016-10-24 DIAGNOSIS — I10 Essential (primary) hypertension: Secondary | ICD-10-CM | POA: Diagnosis not present

## 2016-10-24 DIAGNOSIS — E782 Mixed hyperlipidemia: Secondary | ICD-10-CM | POA: Insufficient documentation

## 2016-10-24 DIAGNOSIS — I340 Nonrheumatic mitral (valve) insufficiency: Secondary | ICD-10-CM | POA: Diagnosis not present

## 2016-10-24 DIAGNOSIS — I071 Rheumatic tricuspid insufficiency: Secondary | ICD-10-CM | POA: Insufficient documentation

## 2016-10-24 DIAGNOSIS — R072 Precordial pain: Secondary | ICD-10-CM

## 2016-10-24 DIAGNOSIS — I351 Nonrheumatic aortic (valve) insufficiency: Secondary | ICD-10-CM | POA: Diagnosis not present

## 2016-10-24 NOTE — Telephone Encounter (Signed)
-----   Message from Armando Gang sent at 10/23/2016 11:40 AM EDT ----- Regarding: RE: cardiac CTA -11-03-16 @ 11:30   nelson to read    ----- Message ----- From: Megan Salon McVey Sent: 10/17/2016   4:40 PM To: Nuala Alpha, LPN, Armando Gang, # Subject: cardiac CTA                                    Ivin Booty patient needs cardiac CTA for Dr Meda Coffee to read,    I sent to precert for approval

## 2016-10-25 ENCOUNTER — Telehealth: Payer: Self-pay | Admitting: Cardiology

## 2016-10-25 NOTE — Telephone Encounter (Signed)
The pt has been given her Echo results. She verbalized understanding.

## 2016-10-25 NOTE — Telephone Encounter (Signed)
New Message  Pt voiced returning nurses call about echo.  Please f/u

## 2016-10-26 ENCOUNTER — Ambulatory Visit: Payer: Medicare Other | Admitting: Physical Therapy

## 2016-10-27 ENCOUNTER — Ambulatory Visit: Payer: Self-pay | Admitting: Physician Assistant

## 2016-10-28 ENCOUNTER — Encounter: Payer: Self-pay | Admitting: Physical Therapy

## 2016-10-30 ENCOUNTER — Ambulatory Visit: Payer: Medicare Other | Admitting: Physical Therapy

## 2016-10-30 NOTE — Progress Notes (Signed)
Subjective:    Patient ID: [name], female    DOB: 08-12-1945, 71 y.o.   MRN: [id number]  HPI She is here for follow up.  She has rhinorrhea with clear discharge.  She has PND.    Prediabetes:  She is compliant with a low sugar/carbohydrate diet.  She is exercising regularly.  Hypertension: She is taking her medication daily. She is compliant with a low sodium diet.  She denies chest pain, palpitations, edema, shortness of breath and regular headaches. She is not exercising regularly.  She does not monitor her blood pressure at home.    Chest pain:  She did go to the ED for chest pain 10/12/16.  She was ruled out for ACS.  She has followed up with cardiology.  Her BP medication was changed.  She denies chest pain since the ED.    Hyperlipidemia: She is not taking any medication and wants to try lifestyle changes.  She will have her cholesterol rechecked in a couple of months and will follow up with cardiology.   She is compliant with a low fat/cholesterol diet. She is not exercising regularly.   Osteoporosis:  She has discussed this with her gyn  And she recommended Forteo.  She is not walking regularly.  She is not doing any upper body weights/resistance.     Medications and allergies reviewed with patient and updated if appropriate.  Patient Active Problem List   Diagnosis Date Noted  . Chest pain 09/21/2016  . 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 Renal insufficiency 06/29/2016  . RUQ discomfort 06/29/2016  . Osteoporosis 06/21/2016  . Dizziness 05/16/2016  . Arthritis of left shoulder region 01/17/2016  . Ankylosing spondylitis ([place]) 11/08/2015  . Incontinence 10/28/2015  . Thoracic back pain 10/19/2015  . Muscle cramps 09/29/2015  . Prediabetes 09/29/2015  . 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 Impingement syndrome of left shoulder region 09/22/2014  . Interstitial  cystitis 07/09/2014  . Diverticulosis of colon without hemorrhage 07/08/2014  . Carpal tunnel syndrome of left wrist 04/12/2014  . Food allergy 03/06/2014  . Renal calculi 01/30/2014  . OSA (obstructive sleep apnea), moderate 01/14/2014  . Visual changes 12/17/2013  . Seasonal and perennial allergic rhinitis 12/06/2013  . Seasonal allergic conjunctivitis 12/06/2013  . Pneumonia, primary atypical 05/15/2013  . History of Bell's palsy 02/26/2012  . Vitamin D deficiency 06/27/2010  . 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  . DRY MOUTH 06/27/2010  . ABDOMINAL PAIN, SUPRAPUBIC 05/25/2009  . GERD 04/30/2009  . DIVERTICULOSIS, COLON 04/30/2009  . History of colonic polyps 04/30/2009  . COSTOCHONDRITIS 01/15/2008  . HYPERLIPIDEMIA 08/21/2007  . Essential hypertension 08/21/2007  . ARTHRALGIA 08/21/2007  . Chronic lumbar radiculopathy 01/25/2007    Current Outpatient Prescriptions on File Prior to Visit  Medication Sig Dispense Refill  . acetaminophen (TYLENOL) 500 MG tablet Take by mouth.    . Ascorbic Acid (VITAMIN C) 1000 MG tablet Take by mouth.    Marland Kitchen aspirin 81 MG tablet Take 81 mg by mouth daily.      . blood glucose meter kit and supplies KIT Dispense based on patient and insurance preference. Use up to four times daily as directed. prediabetes 1 each 0  . Calcium Carbonate-Vit D-Min (CALCIUM 1200 PO) Take 1,200 mg by mouth 2 (two) times daily.    . cetirizine (ZYRTEC) 10 MG tablet Take 10 mg by mouth daily.      Marland Kitchen  Cholecalciferol (VITAMIN D3) 5000 units TABS Take 5,000 mg by mouth daily.    . Diclofenac Sodium 2 % SOLN Place onto the skin as directed for pain as needed.    . hydrochlorothiazide (MICROZIDE) 12.5 MG capsule Take 1 capsule (12.5 mg total) by mouth daily. 90 capsule 3  . meloxicam (MOBIC) 15 MG tablet Take 1 tablet (15 mg total) by mouth daily as needed for pain. With food 90 tablet 2  . metoprolol succinate (TOPROL XL) 25 MG 24 hr tablet Take 1 tablet (25 mg total) by mouth  daily. 90 tablet 3  . Misc Natural Products (COSAMIN ASU ADVANCED FORMULA PO) Take 2 each by mouth daily.    . Omega-3 Fatty Acids (FISH OIL BURP-LESS) 1000 MG CAPS Takes 1000 mg BID 60 capsule 0  . Polyethyl Glycol-Propyl Glycol (SYSTANE OP) Apply 1 drop to eye as needed. Apply to both eyes as needed    . vitamin B-12 (CYANOCOBALAMIN) 1000 MCG tablet Take by mouth.    . vitamin E 400 UNIT capsule Take 400 Units by mouth daily.    . methocarbamol (ROBAXIN) 500 MG tablet Take 1 tablet (500 mg total) by mouth every 6 (six) hours as needed for muscle spasms. (Patient not taking: Reported on 10/31/2016) 20 tablet 0  . OSCIMIN 0.125 MG SL tablet      No current facility-administered medications on file prior to visit.     Past Medical History:  Diagnosis Date  . Allergy    seasonal  . Ankylosing spondylitis ([place])   . Anxiety   . Arthritis   . Bell palsy   . CTS (carpal tunnel syndrome)   . Cystitis   . Diverticulitis   . Diverticulosis of colon   . Dry eye syndrome   . Frozen shoulder   . GERD (gastroesophageal reflux disease)   . HLA B27 (HLA B27 positive)   . Hx of colonic polyps   . Hyperlipidemia    NMR 2005  . Hypertension   . Hypothyroidism   . Incontinence   . Internal hemorrhoids   . Interstitial cystitis   . Kidney stone   . Menopause   . Osteoarthritis   . Osteopenia    [facility]  . S/P total hysterectomy and bilateral salpingo-oophorectomy 04/12/2014  . Sleep apnea   . Subjective visual disturbance of both eyes 12/30/2013  . Tubular adenoma of colon   . Vitamin D deficiency   . Xerostomia    and Xeroophthalmia    Past Surgical History:  Procedure Laterality Date  . ABDOMINAL HYSTERECTOMY    . BLADDER SURGERY     for incontinence  . CARPAL TUNNEL RELEASE Left 12/22/2014   Procedure: LEFT CARPAL TUNNEL RELEASE;  Surgeon: Daryll Brod, MD;  Location: [facility];  Service: Orthopedics;  Laterality: Left;  . COLONOSCOPY W/  POLYPECTOMY     adenomatous poyp 04-2008  . colonscopy     tics 10-2002  . CYSTOSCOPY WITH RETROGRADE PYELOGRAM, URETEROSCOPY AND STENT PLACEMENT Left 09/01/2013   Procedure: CYSTOSCOPY WITH RETROGRADE PYELOGRAM, AND LEFT STENT PLACEMENT;  Surgeon: Molli Hazard, MD;  Location: [facility];  Service: Urology;  Laterality: Left;  . elevated LFTs     due to Elmiron  . FINGER SURGERY Left 2004   2nd finger  . G 1 P 1    . interstitial cystitis     with clot in catheter  . SHOULDER ARTHROSCOPY Left   . TOTAL ABDOMINAL  HYSTERECTOMY W/ BILATERAL SALPINGOOPHORECTOMY     dysfunctional menses    Social History   Social History  . Marital status: Married    Spouse name: N/A  . Number of children: 1  . Years of education: BS   Occupational History  . retired   . teacher     Kindergarten   Social History Main Topics  . Smoking status: Never Smoker  . Smokeless tobacco: Never Used  . Alcohol use Yes     Comment: occ  . Drug use: No  . Sexual activity: Not Currently   Other Topics Concern  . Not on file   Social History Narrative   Married          Family History  Problem Relation Age of Onset  . Coronary artery disease Mother   . Hypertension Mother   . Asthma Mother   . Osteoporosis Mother   . Dementia Mother   . Stroke Mother     mini cva  . Diabetes Father   . Cirrhosis Father     non alcoholic  . Cancer Sister     colon  . Colon cancer Sister 2  . Cancer Paternal Aunt     2 aunts had renal cancer  . Coronary artery disease Maternal Grandfather   . Cancer Maternal Aunt     breast  . Diabetes Paternal Aunt   . Diabetes Paternal Grandmother     Review of Systems  Constitutional: Negative for fever.  HENT: Positive for postnasal drip and rhinorrhea. Negative for sinus pain and sinus pressure.   Respiratory: Negative for cough, shortness of breath and wheezing.   Cardiovascular: Negative for chest pain, palpitations and leg swelling.  Gastrointestinal:  Positive for nausea (last 2-3 weeks). Negative for abdominal pain.       Gerd  Neurological: Negative for light-headedness and headaches.       Objective:   Vitals:   10/31/16 0811  BP: 124/78  Pulse: 61  Temp: 98.4 F (36.9 C)   There were no vitals filed for this visit. There is no height or weight on file to calculate BMI.  Wt Readings from Last 3 Encounters:  10/17/16 172 lb (78 kg)  10/03/16 171 lb (77.6 kg)  09/21/16 173 lb (78.5 kg)     Physical Exam Constitutional: Appears well-developed and well-nourished. No distress.  HENT:  Head: Normocephalic and atraumatic.  Neck: Neck supple. No tracheal deviation present. No thyromegaly present.  No cervical lymphadenopathy Cardiovascular: Normal rate, regular rhythm and normal heart sounds.   No murmur heard. No carotid bruit .  No edema Pulmonary/Chest: Effort normal and breath sounds normal. No respiratory distress. No has no wheezes. No rales.  Skin: Skin is warm and dry. Not diaphoretic.  Psychiatric: Normal mood and affect. Behavior is normal.       Assessment & Plan:    See Problem List for Assessment and Plan of chronic medical problems.

## 2016-10-31 ENCOUNTER — Encounter: Payer: Self-pay | Admitting: Internal Medicine

## 2016-10-31 ENCOUNTER — Other Ambulatory Visit (INDEPENDENT_AMBULATORY_CARE_PROVIDER_SITE_OTHER): Payer: Medicare Other

## 2016-10-31 ENCOUNTER — Ambulatory Visit (INDEPENDENT_AMBULATORY_CARE_PROVIDER_SITE_OTHER): Payer: Medicare Other | Admitting: Internal Medicine

## 2016-10-31 VITALS — BP 124/78 | HR 61 | Temp 98.4°F

## 2016-10-31 VITALS — BP 100/68 | HR 76 | Ht 61.5 in | Wt 170.2 lb

## 2016-10-31 DIAGNOSIS — Z86010 Personal history of colonic polyps: Secondary | ICD-10-CM | POA: Diagnosis not present

## 2016-10-31 DIAGNOSIS — R7303 Prediabetes: Secondary | ICD-10-CM | POA: Diagnosis not present

## 2016-10-31 DIAGNOSIS — M810 Age-related osteoporosis without current pathological fracture: Secondary | ICD-10-CM

## 2016-10-31 DIAGNOSIS — K589 Irritable bowel syndrome without diarrhea: Secondary | ICD-10-CM

## 2016-10-31 DIAGNOSIS — K219 Gastro-esophageal reflux disease without esophagitis: Secondary | ICD-10-CM

## 2016-10-31 DIAGNOSIS — I10 Essential (primary) hypertension: Secondary | ICD-10-CM

## 2016-10-31 DIAGNOSIS — R072 Precordial pain: Secondary | ICD-10-CM | POA: Diagnosis not present

## 2016-10-31 LAB — COMPREHENSIVE METABOLIC PANEL
ALK PHOS: 73 U/L (ref 39–117)
ALT: 30 U/L (ref 0–35)
AST: 25 U/L (ref 0–37)
Albumin: 4.5 g/dL (ref 3.5–5.2)
BUN: 18 mg/dL (ref 6–23)
CHLORIDE: 102 meq/L (ref 96–112)
CO2: 27 mEq/L (ref 19–32)
Calcium: 9.8 mg/dL (ref 8.4–10.5)
Creatinine, Ser: 1.1 mg/dL (ref 0.40–1.20)
GFR: 51.79 mL/min — AB (ref >60.00)
Glucose, Bld: 115 mg/dL — ABNORMAL HIGH (ref 70–99)
POTASSIUM: 3.8 meq/L (ref 3.5–5.1)
Sodium: 138 mEq/L (ref 135–145)
TOTAL PROTEIN: 7.6 g/dL (ref 6.0–8.3)
Total Bilirubin: 0.5 mg/dL (ref 0.2–1.2)

## 2016-10-31 LAB — HEMOGLOBIN A1C: Hgb A1c MFr Bld: 6.4 % (ref 4.6–6.5)

## 2016-10-31 LAB — LIPID PANEL
CHOLESTEROL: 192 mg/dL (ref 0–200)
HDL: 49.5 mg/dL (ref >39.00)
LDL CALC: 105 mg/dL — AB (ref 0–99)
NonHDL: 142.06
TRIGLYCERIDES: 185 mg/dL — AB (ref 0.0–149.0)
Total CHOL/HDL Ratio: 4
VLDL: 37 mg/dL (ref 0.0–40.0)

## 2016-10-31 LAB — VITAMIN B12: Vitamin B-12: >1500 pg/mL — ABNORMAL HIGH (ref 211–911)

## 2016-10-31 MED ORDER — ONDANSETRON 4 MG PO TBDP: 4.0000 mg | ORAL_TABLET | Freq: Three times a day (TID) | ORAL | 0 refills | Status: DC | PRN

## 2016-10-31 MED ORDER — RANITIDINE HCL 150 MG PO TABS: 150.0000 mg | ORAL_TABLET | Freq: Two times a day (BID) | ORAL | 5 refills | Status: DC

## 2016-10-31 MED FILL — ONDANSETRON ODT 4 MG TABLET: 4 | 6 days supply | Qty: 20 | Fill #0

## 2016-10-31 MED FILL — raNITIdine HCL 150 MG TABS: 150 | 30 days supply | Qty: 60 | Fill #0

## 2016-10-31 NOTE — Patient Instructions (Addendum)
Your physician has requested that you go to the basement for the following lab work before leaving today: B12, intrinsic factor antibody  We have sent the following medications to your pharmacy for you to pick up at your convenience: Ranitidine 150 mg twice daily  You have been scheduled for an appointment with Dr Christianne Dolin on Friday, Dec 08, 2016 @ 8:15 am. Please arrive no later than 8 am. Be sure to bring your new patient paperwork (this will be mailed to your home address), your insurance cards and a current list of your medications including medication name, dose and number of times per day each medication is taken. Dr Thayer Ohm office is located at: [address] . [place]  Ph [phone]   Please follow up with Dr Hilarie Fredrickson in 6 months.  If you are age 58 or older, your body mass index should be between 23-30. Your Body mass index is 31.65 kg/m. If this is out of the aforementioned range listed, please consider follow up with your Primary Care Provider.  If you are age 50 or younger, your body mass index should be between 19-25. Your Body mass index is 31.65 kg/m. If this is out of the aformentioned range listed, please consider follow up with your Primary Care Provider.

## 2016-10-31 NOTE — Assessment & Plan Note (Signed)
discussed medication options - she is considering forteo vs a bisphosphonate She has seen her gyn and has discussed this

## 2016-10-31 NOTE — Assessment & Plan Note (Signed)
Resolved Has seen cardiology

## 2016-10-31 NOTE — Progress Notes (Signed)
Subjective:    Patient ID: [name], female    DOB: Oct 03, 1945, 71 y.o.   MRN: [id number]  HPI [name] is a 71 year old female with a past medical history of adenomatous colon polyps, colonic diverticulosis, GERD and IBS who is here for follow-up. She was last seen at the time of her surveillance colonoscopy in June 2017. She is here alone today. She also has a history of HLA-B27 positive ankylosing spondylitis, arthritis, hyperlipidemia, hypertension, hypothyroidism, vitamin D and osteoporosis.  She reports that she has been having frequent indigestion recently which is worse if she eats beef and pork. This is also associated with belching. Occasionally she feels that food will stop at her sternal notch. This is intermittent and not reproducible based on what she eats. This can involve both solids and liquids. It has not occurred recently. She denies nausea and vomiting. She is not currently taking an acid reflux medication.  She reports that she has chronic long-standing nearly daily abdominal crampy discomfort. She is having one to 2 bowel movements per day. Bowel movements have been softer and more regular since taking fish oil. Without fish oil her stool can initially be hard followed by more normal. She denies blood in her stool and melena. In the past she tried hyoscyamine without much benefit.  She requests B12 testing. She started oral B12 when her daughter was diagnosed with B12 deficiency and pernicious anemia.  She also has discussed progression from osteopenia to osteoporosis with to gynecologist recently and has gotten somewhat mixed opinions regarding therapy for osteoporosis. She has asking for an opinion with an osteoporosis doctor.  We reviewed colonoscopy findings today together. Colonoscopy was performed on 01/27/2016. 7 polyps removed ranging in size from 3-8 mm. These were tubular adenomas. There were multiple diverticula in the sigmoid and internal  hemorrhoids.  Review of Systems As per history of present illness, otherwise negative  Current Medications, Allergies, Past Medical History, Past Surgical History, Family History and Social History were reviewed in [employer] record.     Objective:   Physical Exam BP 100/68 (BP Location: Left Arm, Patient Position: Sitting, Cuff Size: Normal)   Pulse 76   Ht 5' 1.5" (1.562 m) Comment: height measured without shoes  Wt 170 lb 4 oz (77.2 kg)   BMI 31.65 kg/m  Constitutional: Well-developed and well-nourished. No distress. HEENT: Normocephalic and atraumatic.   Conjunctivae are normal.  No scleral icterus. Neck: Neck supple. Trachea midline. Cardiovascular: Normal rate, regular rhythm and intact distal pulses. No M/R/G Pulmonary/chest: Effort normal and breath sounds normal. No wheezing, rales or rhonchi. Abdominal: Soft, nontender, nondistended. Bowel sounds active throughout.   Extremities: no clubbing, cyanosis, or edema Lymphadenopathy: No cervical adenopathy noted. Neurological: Alert and oriented to person place and time. Skin: Skin is warm and dry.   Psychiatric: Normal mood and affect. Behavior is normal.  CBC    Component Value Date/Time   WBC 6.7 10/12/2016 2156   RBC 3.94 10/12/2016 2156   HGB 11.9 (L) 10/12/2016 2156   HCT 35.1 (L) 10/12/2016 2156   PLT 237 10/12/2016 2156   MCV 89.1 10/12/2016 2156   MCH 30.2 10/12/2016 2156   MCHC 33.9 10/12/2016 2156   RDW 12.5 10/12/2016 2156   LYMPHSABS 1.6 05/02/2016 0919   MONOABS 0.5 05/02/2016 0919   EOSABS 0.2 05/02/2016 0919   BASOSABS 0.0 05/02/2016 0919   CMP     Component Value Date/Time   NA 138 10/31/2016 0903  K 3.8 10/31/2016 0903   CL 102 10/31/2016 0903   CO2 27 10/31/2016 0903   GLUCOSE 115 (H) 10/31/2016 0903   GLUCOSE 99 07/23/2006 1101   BUN 18 10/31/2016 0903   CREATININE 1.10 10/31/2016 0903   CALCIUM 9.8 10/31/2016 0903   PROT 7.6 10/31/2016 0903   ALBUMIN 4.5  10/31/2016 0903   AST 25 10/31/2016 0903   ALT 30 10/31/2016 0903   ALKPHOS 73 10/31/2016 0903   BILITOT 0.5 10/31/2016 0903   GFRNONAA 48 (L) 10/12/2016 2156   GFRAA 56 (L) 10/12/2016 2156      Assessment & Plan:  71 year old female with a past medical history of adenomatous colon polyps, colonic diverticulosis, GERD and IBS who is here for follow-up.  1. GERD/indigestion -- not currently on therapy. I recommended ranitidine 150 mg twice a day. I chose H2 blocker over PPI given her osteoporosis and concern thereof. Escalate depending on symptoms. If there is any residual dysphagia after adequate trial of acid suppression and upper endoscopy is recommended. She is aware of this recommendation.  2. IBS -- chronic and long-standing. She manages this with dietary choices and avoiding known dietary triggers. We discussed a trial of a different antispasmodic but she declines at present. Continuing fish oil will also likely help avoid constipation.  3. Family history of pernicious anemia -- check B12 level today. She is on oral supplementation at this point.  4. History of colon polyps -- adenomatous, multiple. Surveillance colonoscopy recommended in 3 years which be June 2020.  5. Osteoporosis  -- endocrinology referral recommended to discuss metabolic bone disease and osteoporosis and treatment thereof.  Will refer to Dr. Cruzita Lederer.  25 minutes spent with the patient today. Greater than 50% was spent in counseling and coordination of care with the patient

## 2016-10-31 NOTE — Progress Notes (Signed)
Pre visit review using our clinic review tool, if applicable. No additional management support is needed unless otherwise documented below in the visit note. 

## 2016-10-31 NOTE — Assessment & Plan Note (Signed)
Check a1c Low sugar / carb diet Stressed regular exercise, weight loss  

## 2016-10-31 NOTE — Assessment & Plan Note (Signed)
BP well controlled Current regimen effective and well tolerated Continue current medications at current doses  

## 2016-10-31 NOTE — Patient Instructions (Signed)
  Test(s) ordered today. Your results will be released to MyChart (or called to you) after review, usually within 72hours after test completion. If any changes need to be made, you will be notified at that same time.  Medications reviewed and updated.  No changes recommended at this time.    Please followup in 6 months   

## 2016-11-01 ENCOUNTER — Ambulatory Visit: Payer: Medicare Other | Admitting: Physical Therapy

## 2016-11-02 ENCOUNTER — Encounter: Payer: Self-pay | Admitting: Internal Medicine

## 2016-11-02 LAB — INTRINSIC FACTOR ANTIBODIES: Intrinsic Factor: NEGATIVE

## 2016-11-03 ENCOUNTER — Ambulatory Visit (HOSPITAL_COMMUNITY)
Admission: RE | Admit: 2016-11-03 | Discharge: 2016-11-03 | Disposition: A | Discharge status: Home / Self Care | Payer: Medicare Other | Source: Ambulatory Visit | Attending: Cardiology | Admitting: Cardiology

## 2016-11-03 ENCOUNTER — Encounter (HOSPITAL_COMMUNITY): Payer: Self-pay

## 2016-11-03 DIAGNOSIS — I700 Atherosclerosis of aorta: Secondary | ICD-10-CM | POA: Diagnosis not present

## 2016-11-03 DIAGNOSIS — R072 Precordial pain: Secondary | ICD-10-CM | POA: Diagnosis not present

## 2016-11-03 DIAGNOSIS — R079 Chest pain, unspecified: Secondary | ICD-10-CM | POA: Diagnosis not present

## 2016-11-03 DIAGNOSIS — K760 Fatty (change of) liver, not elsewhere classified: Secondary | ICD-10-CM | POA: Insufficient documentation

## 2016-11-03 DIAGNOSIS — R918 Other nonspecific abnormal finding of lung field: Secondary | ICD-10-CM | POA: Insufficient documentation

## 2016-11-03 MED ORDER — NITROGLYCERIN 0.4 MG SL SUBL
SUBLINGUAL_TABLET | SUBLINGUAL | Status: AC
  Filled 2016-11-03: qty 1

## 2016-11-03 MED ORDER — IOPAMIDOL (ISOVUE-370) INJECTION 76%
INTRAVENOUS | Status: AC
  Administered 2016-11-03: 80 mL
  Filled 2016-11-03: qty 100

## 2016-11-03 MED ORDER — NITROGLYCERIN 0.4 MG SL SUBL
0.4000 mg | SUBLINGUAL_TABLET | SUBLINGUAL | Status: DC | PRN
  Administered 2016-11-03: 0.4 mg via SUBLINGUAL

## 2016-11-03 NOTE — Progress Notes (Signed)
Pt tolerated NTG pill well. CT heart test complete

## 2016-11-03 NOTE — Progress Notes (Signed)
Pt chart states she is allergic to NTG- states it makes her Hypotensive. Spoke wwith Dr Meda Coffee and she wants pt to be given one NTG for Ct heart test; she is aware of allergy. Pt agrees to have procedure and NTG. Pt reassuued that we will treat her if becomes hypotensive.

## 2016-11-06 ENCOUNTER — Ambulatory Visit: Payer: Medicare Other | Admitting: Physical Therapy

## 2016-11-07 ENCOUNTER — Telehealth: Payer: Self-pay | Admitting: *Deleted

## 2016-11-07 DIAGNOSIS — I7090 Unspecified atherosclerosis: Secondary | ICD-10-CM | POA: Insufficient documentation

## 2016-11-07 MED ORDER — ROSUVASTATIN CALCIUM 5 MG PO TABS: 5.0000 mg | ORAL_TABLET | Freq: Every day | ORAL | 1 refills | Status: DC

## 2016-11-07 MED FILL — ROSUVASTATIN CALCIUM 5 MG T: 5 | 30 days supply | Qty: 30 | Fill #0

## 2016-11-07 NOTE — Telephone Encounter (Signed)
-----   Message from Dorothy Spark, MD sent at 11/05/2016  1:47 PM EDT ----- The patient has evidence of atherosclerotic plaque that is mild - nonobstructive. But will need to be started on statin - I would start rosuvastatin 5 mg po daily and check lipids and CMP in 1 month.

## 2016-11-07 NOTE — Telephone Encounter (Signed)
Notified the pt that per Dr Meda Coffee, her coronary ct showed evidence of atherosclerotic plaque that is mild, but nonobstructive.  Informed the pt that Dr Meda Coffee recommends that we start her on rosuvastatin 5 mg po daily, and recheck cmet and lipids in one month.  Confirmed the pharmacy of choice with the pt.  Informed the pt that I will call in a month supply to her local pharmacy, to make sure she tolerates this appropriately.  Informed the pt that if this med is tolerated, then she should notify our office when further refills are needed, then we can send this to her mail order service for 90 days thereafter.  Scheduled the pt a lab appt for 12/07/16 to recheck cmet and lipids.  Pt is aware to come fasting to this lab appt.  Pt verbalized understanding and agrees with this plan.

## 2016-11-08 ENCOUNTER — Ambulatory Visit: Payer: Medicare Other | Admitting: Physical Therapy

## 2016-11-10 ENCOUNTER — Encounter: Payer: Self-pay | Admitting: Internal Medicine

## 2016-11-13 NOTE — Progress Notes (Signed)
[name] [place],  [id number] Phone: [phone] Subjective:    I'm seeing this patient by the request  of: Binnie Rail, MD    CC: left ankle pain f/u  [id number]  [name] is a 71 y.o. female coming in with complaint of Left ankle pain. Found to have a very small lateral avulsion fracture that seemed to be healing. Patient at last follow-up was doing significantly better. Was only wearing an Aircast intermittently. Patient states all better No pain. No numbness. Overall making improvement.       Past Medical History:  Diagnosis Date  . Allergy    seasonal  . Ankylosing spondylitis ([place])   . Anxiety   . Arthritis   . Bell palsy   . CTS (carpal tunnel syndrome)   . Cystitis   . Diverticulitis   . Diverticulosis of colon   . Dry eye syndrome   . Frozen shoulder   . GERD (gastroesophageal reflux disease)   . HLA B27 (HLA B27 positive)   . Hx of colonic polyps   . Hyperlipidemia    NMR 2005  . Hypertension   . Hypothyroidism   . Incontinence   . Internal hemorrhoids   . Interstitial cystitis   . Kidney stone   . Menopause   . Osteoarthritis   . Osteopenia    [facility]  . S/P total hysterectomy and bilateral salpingo-oophorectomy 04/12/2014  . Sleep apnea   . Subjective visual disturbance of both eyes 12/30/2013  . Tubular adenoma of colon   . Vitamin D deficiency   . Xerostomia    and Xeroophthalmia   Past Surgical History:  Procedure Laterality Date  . ABDOMINAL HYSTERECTOMY    . BLADDER SURGERY     for incontinence  . CARPAL TUNNEL RELEASE Left 12/22/2014   Procedure: LEFT CARPAL TUNNEL RELEASE;  Surgeon: Daryll Brod, MD;  Location: [facility];  Service: Orthopedics;  Laterality: Left;  . COLONOSCOPY W/ POLYPECTOMY     adenomatous poyp 04-2008  . colonscopy     tics 10-2002  . CYSTOSCOPY WITH RETROGRADE PYELOGRAM, URETEROSCOPY AND STENT PLACEMENT Left  09/01/2013   Procedure: CYSTOSCOPY WITH RETROGRADE PYELOGRAM, AND LEFT STENT PLACEMENT;  Surgeon: Molli Hazard, MD;  Location: [facility];  Service: Urology;  Laterality: Left;  . elevated LFTs     due to Elmiron  . FINGER SURGERY Left 2004   2nd finger  . G 1 P 1    . interstitial cystitis     with clot in catheter  . SHOULDER ARTHROSCOPY Left   . TOTAL ABDOMINAL HYSTERECTOMY W/ BILATERAL SALPINGOOPHORECTOMY     dysfunctional menses   Social History   Social History  . Marital status: Married    Spouse name: N/A  . Number of children: 1  . Years of education: BS   Occupational History  . retired   . teacher     Kindergarten   Social History Main Topics  . Smoking status: Never Smoker  . Smokeless tobacco: Never Used  . Alcohol use Yes     Comment: occ  . Drug use: No  . Sexual activity: Not Currently   Other Topics Concern  . None   Social History Narrative   Married         Allergies  Allergen Reactions  . Nitroglycerin Anaphylaxis    Hypotensive after NTG administration in ER during chest pain  evaluation  . Pentosan Polysulfate Sodium     Elevated LFTs.......Marland Kitchen elmiron   . Bactrim [Sulfamethoxazole-Trimethoprim]   . Promethazine Hcl     "jittery on the inside"  . Lisinopril Cough  . Sulfamethoxazole-Trimethoprim Nausea Only    Unknown reaction per pt   Family History  Problem Relation Age of Onset  . Coronary artery disease Mother   . Hypertension Mother   . Asthma Mother   . Osteoporosis Mother   . Dementia Mother   . Stroke Mother     mini cva  . Diabetes Father   . Cirrhosis Father     non alcoholic  . Cancer Sister     colon  . Colon cancer Sister 63  . Cancer Paternal Aunt     2 aunts had renal cancer  . Coronary artery disease Maternal Grandfather   . Cancer Maternal Aunt     breast  . Diabetes Paternal Aunt   . Diabetes Paternal Grandmother     Past medical history, social, surgical and family history all reviewed in  electronic medical record.  No pertanent information unless stated regarding to the chief complaint.   Review of Systems: No headache, visual changes, nausea, vomiting, diarrhea, constipation, dizziness, abdominal pain, skin rash, fevers, chills, night sweats, weight loss, swollen lymph nodes, continued positive body and muscle aches  Objective  Blood pressure 124/80, pulse 60, height 5' 2"  (1.575 m), weight 173 lb (78.5 kg).   Systems examined below as of 11/14/16 General: NAD A&O x3 mood, affect normal  HEENT: Pupils equal, extraocular movements intact no nystagmus Respiratory: not short of breath at rest or with speaking Cardiovascular: No lower extremity edema, non tender Skin: Warm dry intact with no signs of infection or rash on extremities or on axial skeleton. Abdomen: Soft nontender, no masses Neuro: Cranial nerves  intact, neurovascularly intact in all extremities with 2+ DTRs and 2+ pulses. Lymph: No lymphadenopathy appreciated today  Gait normal with good balance and coordination.  MSK: Non tender with full range of motion and good stability and symmetric strength and tone of shoulders, elbows, wrist,  knee hips bilaterally.   Ankle: Left No swelling noted Range of motion is full in all directions.  Strength is 5/5 in all directions. Once again mild pain over the eversion of the ankle Stable lateral and medial ligaments; squeeze test and kleiger test unremarkable; Talar dome nontender; No pain at base of 5th MT; No tenderness over cuboid; No tenderness over N spot or navicular prominence No tenderness on posterior aspects of lateral and medial malleolus No subluxation of the peroneal tendons and very minimal tenderness to palpation.. Negative tarsal tunnel tinel's Able to walk 4 steps. Contralateral ankle unremarkable   Impression and Recommendations:     This case required medical decision making of moderate complexity.

## 2016-11-14 ENCOUNTER — Encounter: Payer: Self-pay | Admitting: Family Medicine

## 2016-11-14 ENCOUNTER — Ambulatory Visit (INDEPENDENT_AMBULATORY_CARE_PROVIDER_SITE_OTHER): Payer: Medicare Other | Admitting: Family Medicine

## 2016-11-14 DIAGNOSIS — M24472 Recurrent dislocation, left ankle: Secondary | ICD-10-CM

## 2016-11-14 NOTE — Assessment & Plan Note (Signed)
Stable. Continue conservative therapy otherwise. We discussed icing regimen. Use brace as needed. Follow-up as needed.

## 2016-11-14 NOTE — Patient Instructions (Signed)
Great to see you!   

## 2016-11-16 DIAGNOSIS — M810 Age-related osteoporosis without current pathological fracture: Secondary | ICD-10-CM | POA: Diagnosis not present

## 2016-11-16 DIAGNOSIS — Z8262 Family history of osteoporosis: Secondary | ICD-10-CM | POA: Diagnosis not present

## 2016-11-16 DIAGNOSIS — Z9181 History of falling: Secondary | ICD-10-CM | POA: Insufficient documentation

## 2016-11-16 DIAGNOSIS — K219 Gastro-esophageal reflux disease without esophagitis: Secondary | ICD-10-CM | POA: Diagnosis not present

## 2016-11-21 DIAGNOSIS — M810 Age-related osteoporosis without current pathological fracture: Secondary | ICD-10-CM | POA: Diagnosis not present

## 2016-11-24 ENCOUNTER — Other Ambulatory Visit: Payer: Self-pay

## 2016-11-29 ENCOUNTER — Ambulatory Visit: Payer: Self-pay | Admitting: Cardiology

## 2016-12-04 ENCOUNTER — Telehealth: Payer: Self-pay | Admitting: Internal Medicine

## 2016-12-04 MED ORDER — CIPROFLOXACIN HCL 500 MG PO TABS: 500.0000 mg | ORAL_TABLET | Freq: Two times a day (BID) | ORAL | 0 refills | Status: DC

## 2016-12-04 MED ORDER — METRONIDAZOLE 500 MG PO TABS: 500.0000 mg | ORAL_TABLET | Freq: Three times a day (TID) | ORAL | 0 refills | Status: DC

## 2016-12-04 MED FILL — CIPROFLOXACIN HCL 500 MG TA: 500 | 7 days supply | Qty: 14 | Fill #0

## 2016-12-04 MED FILL — metroNIDAZOLE 500 MG TABS: 500 | 7 days supply | Qty: 21 | Fill #0

## 2016-12-04 NOTE — Telephone Encounter (Signed)
Pt scheduled to see Ellouise Newer PA 12/13/16@1 :45pm. Appt letter mailed to pt.

## 2016-12-04 NOTE — Telephone Encounter (Signed)
Spoke with pt and she is aware. Scripts sent to pharmacy. 

## 2016-12-04 NOTE — Telephone Encounter (Signed)
She may have sigmoid diverticulitis given her history of diverticulosis Would begin Cipro 500 mg twice a day and metronidazole 500 3 times a day both for 7 days Advanced practitioner visit to ensure improvement If worsening she needs to let us know or go to the ER

## 2016-12-04 NOTE — Telephone Encounter (Signed)
Pt states Friday evening she started having LLQ abd pain. States it doubled her over. She reports that she stayed in the bed Saturday and Sunday, the pain is still there this morning. Now she states that area is tender to touch. She has had no nausea or fever. Please advise.

## 2016-12-07 ENCOUNTER — Other Ambulatory Visit: Payer: Medicare Other | Admitting: *Deleted

## 2016-12-07 DIAGNOSIS — I7090 Unspecified atherosclerosis: Secondary | ICD-10-CM

## 2016-12-07 LAB — LIPID PANEL
Chol/HDL Ratio: 2.1 ratio (ref 0.0–4.4)
Cholesterol, Total: 113 mg/dL (ref 100–199)
HDL: 54 mg/dL (ref >39)
LDL Calculated: 44 mg/dL (ref 0–99)
Triglycerides: 74 mg/dL (ref 0–149)
VLDL Cholesterol Cal: 15 mg/dL (ref 5–40)

## 2016-12-07 LAB — COMPREHENSIVE METABOLIC PANEL
ALT: 14 IU/L (ref 0–32)
AST: 21 IU/L (ref 0–40)
Albumin/Globulin Ratio: 1.4 (ref 1.2–2.2)
Albumin: 4.2 g/dL (ref 3.5–4.8)
Alkaline Phosphatase: 76 IU/L (ref 39–117)
BUN/Creatinine Ratio: 18 (ref 12–28)
BUN: 20 mg/dL (ref 8–27)
Bilirubin Total: 0.5 mg/dL (ref 0.0–1.2)
CO2: 24 mmol/L (ref 18–29)
Calcium: 9.8 mg/dL (ref 8.7–10.3)
Chloride: 95 mmol/L — ABNORMAL LOW (ref 96–106)
Creatinine, Ser: 1.12 mg/dL — ABNORMAL HIGH (ref 0.57–1.00)
GFR calc Af Amer: 57 mL/min/{1.73_m2} — ABNORMAL LOW (ref >59)
GFR calc non Af Amer: 49 mL/min/{1.73_m2} — ABNORMAL LOW (ref >59)
Globulin, Total: 2.9 g/dL (ref 1.5–4.5)
Glucose: 111 mg/dL — ABNORMAL HIGH (ref 65–99)
Potassium: 3.9 mmol/L (ref 3.5–5.2)
Sodium: 138 mmol/L (ref 134–144)
Total Protein: 7.1 g/dL (ref 6.0–8.5)

## 2016-12-08 ENCOUNTER — Encounter: Payer: Self-pay | Admitting: Internal Medicine

## 2016-12-08 ENCOUNTER — Ambulatory Visit (INDEPENDENT_AMBULATORY_CARE_PROVIDER_SITE_OTHER): Payer: Medicare Other | Admitting: Internal Medicine

## 2016-12-08 VITALS — BP 122/72 | HR 67 | Ht 63.0 in | Wt 167.0 lb

## 2016-12-08 DIAGNOSIS — M810 Age-related osteoporosis without current pathological fracture: Secondary | ICD-10-CM

## 2016-12-08 MED ORDER — FUROSEMIDE 20 MG PO TABS: 20.0000 mg | ORAL_TABLET | Freq: Every day | ORAL | 3 refills | Status: DC

## 2016-12-08 MED ORDER — ALENDRONATE SODIUM 70 MG PO TABS: 70.0000 mg | ORAL_TABLET | ORAL | 11 refills | Status: DC

## 2016-12-08 MED FILL — ALENDRONATE NA 70 MG TAB: 70 | 28 days supply | Qty: 4 | Fill #0

## 2016-12-08 MED FILL — FUROSEMIDE 20 MG TABLET: 20 | 90 days supply | Qty: 90 | Fill #0

## 2016-12-08 NOTE — Progress Notes (Addendum)
Patient ID: [name], female   DOB: 15-Oct-1945, 71 y.o.   MRN: [id number]    HPI  CINCERE DEPREY is a 71 y.o.-year-old female, referred by her Dr. Hilarie Fredrickson, for management of osteoporosis.  Pt was dx with OP in 2017.  I reviewed pt's DEXA scans: Date L1-L4 T score FN T score 33% distal Radius Ultra distal left radius   06/20/2016 (Lunar)  n/a RFN: -1.8 LFN: -2.2 -3.9  -3.6   04/28/2014 (Hologic)  L1, L3, L4: -0.8 (+5.7%*)  RFN: n/a LFN: -1.1 n/a n/a  08/03/2011 (Hologic)  L1, L3, L4: -1.3 (+1.9%)  RFN: n/a LFN: -1.5 n/a n/a   She had a avulsion fracture of the left ankle several years ago.  She does have dizziness, but no vertigo/orthostasis/poor vision. No falls.  Previous OP treatments:  - Boniva ~ for 3 years - "years ago" - Forteo - started 11/21/2016  + distant h/o vitamin D deficiency. Reviewed available vit D levels: 11/16/2016: vit D 67 Lab Results  Component Value Date   VD25OH 77.84 06/03/2015   VD25OH 44 07/11/2011   VD25OH 30 04/30/2009   On HCTZ - started recently.  Pt is on vitamin D 5000 IU day + calcium 600 mg now, but decreased from 1200 mg on 11/16/2016. She also eats dairy and green, leafy, vegetables.   No weight bearing exercises.   She does not take high vitamin A doses. Few steroid tapers. She also had steroid inj x2 in L shoulder.  Menopause was at 71 y/o.   Pt does have a FH of osteoporosis: mother.  Pt had 2x slightly elevated calcium earlier this year, w/o a suppressed PTH; + h/o kidney stones x 1 (2015) - had to have a stent. 11/16/2016: PTH 43, calcium 10.6 (8.5-10.5) 08/07/2016: PTH 64.3 (12-72), calcium 10.3 (8.5-10.2) Lab Results  Component Value Date   CALCIUM 9.8 12/07/2016   CALCIUM 9.8 10/31/2016   CALCIUM 9.4 10/12/2016   CALCIUM 9.5 05/02/2016   CALCIUM 10.0 09/29/2015   CALCIUM 9.8 06/03/2015   CALCIUM 9.7 12/17/2014   CALCIUM 9.6 12/16/2013   CALCIUM 9.3 09/01/2013   CALCIUM 9.4 07/08/2013   Of note, she had a  24-hour urine calcium that was normal, at 156.2 performed by OB/GYN.  No h/o thyrotoxicosis. 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 Last BUN/Cr: Lab Results  Component Value Date   BUN 20 12/07/2016   CREATININE 1.12 (H) 12/07/2016   ROS: Constitutional: + Weight gain, + fatigue, + chills, no nocturia Eyes: + blurry vision, no xerophthalmia ENT: no sore throat, no nodules palpated in throat, no dysphagia/odynophagia, no hoarseness Cardiovascular: no CP/SOB/palpitations/leg swelling Respiratory: no cough/SOB Gastrointestinal: no N/V/D/+ C/+ heartburn Musculoskeletal: + both:muscle/joint aches Skin: + Rash, + hair loss Neurological: no tremors/numbness/tingling/dizziness Psychiatric: no depression/anxiety  Past Medical History:  Diagnosis Date  . Allergy    seasonal  . Ankylosing spondylitis ([place])   . Anxiety   . Arthritis   . Bell palsy   . CTS (carpal tunnel syndrome)   . Cystitis   . Diverticulitis   . Diverticulosis of colon   . Dry eye syndrome   . Frozen shoulder   . GERD (gastroesophageal reflux disease)   . HLA B27 (HLA B27 positive)   . Hx of colonic  polyps   . Hyperlipidemia    NMR 2005  . Hypertension   . Hypothyroidism   . Incontinence   . Internal hemorrhoids   . Interstitial cystitis   . Kidney stone   . Menopause   . Osteoarthritis   . Osteopenia    [facility]  . S/P total hysterectomy and bilateral salpingo-oophorectomy 04/12/2014  . Sleep apnea   . Subjective visual disturbance of both eyes 12/30/2013  . Tubular adenoma of colon   . Vitamin D deficiency   . Xerostomia    and Xeroophthalmia   Past Surgical History:  Procedure Laterality Date  . ABDOMINAL HYSTERECTOMY    . BLADDER SURGERY     for incontinence  . CARPAL TUNNEL RELEASE Left 12/22/2014    Procedure: LEFT CARPAL TUNNEL RELEASE;  Surgeon: Daryll Brod, MD;  Location: [facility];  Service: Orthopedics;  Laterality: Left;  . COLONOSCOPY W/ POLYPECTOMY     adenomatous poyp 04-2008  . colonscopy     tics 10-2002  . CYSTOSCOPY WITH RETROGRADE PYELOGRAM, URETEROSCOPY AND STENT PLACEMENT Left 09/01/2013   Procedure: CYSTOSCOPY WITH RETROGRADE PYELOGRAM, AND LEFT STENT PLACEMENT;  Surgeon: Molli Hazard, MD;  Location: [facility];  Service: Urology;  Laterality: Left;  . elevated LFTs     due to Elmiron  . FINGER SURGERY Left 2004   2nd finger  . G 1 P 1    . interstitial cystitis     with clot in catheter  . SHOULDER ARTHROSCOPY Left   . TOTAL ABDOMINAL HYSTERECTOMY W/ BILATERAL SALPINGOOPHORECTOMY     dysfunctional menses   Social History   Social History  . Marital status: Married    Spouse name: N/A  . Number of children: 1  . Years of education: BS   Occupational History  . 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 teacher     Kindergarten   Social History Main Topics  . Smoking status: Never Smoker  . Smokeless tobacco: Never Used  . Alcohol use Yes     Comment: occ  . Drug use: No  . Sexual activity: Not Currently   Other Topics Concern  . Not on file   Social History Narrative   Married         Current Outpatient Prescriptions on File Prior to Visit  Medication Sig Dispense Refill  . acetaminophen (TYLENOL) 500 MG tablet Take by mouth.    . Ascorbic Acid (VITAMIN C) 1000 MG tablet Take by mouth.    Marland Kitchen aspirin 81 MG tablet Take 81 mg by mouth daily.      . blood glucose meter kit and supplies KIT Dispense based on patient and insurance preference. Use up to four times daily as directed. prediabetes 1 each 0  . Calcium Carbonate-Vit D-Min (CALCIUM 1200 PO) Take 1,200 mg by mouth 2 (two) times daily.    . cetirizine (ZYRTEC) 10 MG tablet Take 10 mg by mouth  daily.      . Cholecalciferol (VITAMIN D3) 5000 units TABS Take 5,000 mg by mouth daily.    . ciprofloxacin (CIPRO) 500 MG tablet Take 1 tablet (500 mg total) by mouth 2 (two) times daily. 14 tablet 0  . Diclofenac Sodium 2 % SOLN Place onto the skin as directed for pain as needed.    . meloxicam (MOBIC) 15 MG tablet Take 1  tablet (15 mg total) by mouth daily as needed for pain. With food 90 tablet 2  . metoprolol succinate (TOPROL XL) 25 MG 24 hr tablet Take 1 tablet (25 mg total) by mouth daily. 90 tablet 3  . metroNIDAZOLE (FLAGYL) 500 MG tablet Take 1 tablet (500 mg total) by mouth 3 (three) times daily. 21 tablet 0  . Omega-3 Fatty Acids (FISH OIL BURP-LESS) 1000 MG CAPS Takes 1000 mg BID 60 capsule 0  . Polyethyl Glycol-Propyl Glycol (SYSTANE OP) Apply 1 drop to eye as needed. Apply to both eyes as needed    . ranitidine (ZANTAC) 150 MG tablet Take 1 tablet (150 mg total) by mouth 2 (two) times daily. 60 tablet 5  . rosuvastatin (CRESTOR) 5 MG tablet Take 1 tablet (5 mg total) by mouth daily. 30 tablet 1  . vitamin B-12 (CYANOCOBALAMIN) 1000 MCG tablet Take by mouth.    . vitamin E 400 UNIT capsule Take 400 Units by mouth daily.    . Misc Natural Products (COSAMIN ASU ADVANCED FORMULA PO) Take 2 each by mouth daily.    . ondansetron (ZOFRAN ODT) 4 MG disintegrating tablet Take 1 tablet (4 mg total) by mouth every 8 (eight) hours as needed for nausea or vomiting. (Patient not taking: Reported on 12/08/2016) 20 tablet 0   No current facility-administered medications on file prior to visit.    Allergies  Allergen Reactions  . Nitroglycerin Anaphylaxis    Hypotensive after NTG administration in ER during chest pain evaluation  . Pentosan Polysulfate Sodium     Elevated LFTs.......Marland Kitchen elmiron   . Bactrim [Sulfamethoxazole-Trimethoprim]   . Promethazine Hcl     "jittery on the inside"  . Lisinopril Cough  . Sulfamethoxazole-Trimethoprim Nausea Only    Unknown reaction per pt   Family  History  Problem Relation Age of Onset  . Coronary artery disease Mother   . Hypertension Mother   . Asthma Mother   . Osteoporosis Mother   . Dementia Mother   . Stroke Mother        mini cva  . Diabetes Father   . Cirrhosis Father        non alcoholic  . Cancer Sister        colon  . Colon cancer Sister 53  . Cancer Paternal Aunt        2 aunts had renal cancer  . Coronary artery disease Maternal Grandfather   . Cancer Maternal Aunt        breast  . Diabetes Paternal Aunt   . Diabetes Paternal Grandmother     PE: BP 122/72 (BP Location: Left Arm, Patient Position: Sitting)   Pulse 67   Ht 5' 3"  (1.6 m)   Wt 167 lb (75.8 kg)   SpO2 97%   BMI 29.58 kg/m  Wt Readings from Last 3 Encounters:  11/14/16 173 lb (78.5 kg)  10/31/16 170 lb 4 oz (77.2 kg)  10/17/16 172 lb (78 kg)   Constitutional: overweight, in NAD. No kyphosis. Eyes: PERRLA, EOMI, no exophthalmos ENT: moist mucous membranes, no thyromegaly, no cervical lymphadenopathy Cardiovascular: RRR, No MRG Respiratory: CTA B Gastrointestinal: abdomen soft, NT, ND, BS+ Musculoskeletal: no deformities, strength intact in all 4 Skin: moist, warm, no rashes Neurological: no tremor with outstretched hands, DTR normal in all 4  Assessment: 1. Osteoporosis  2. Hypercalcemia  Plan: 1. Osteoporosis - Possibly postmenopausal, but she also has FH of OP; she also has been on steroid injections and prednisone courses in  the past; she also has a history of hypercalcemia (? Poss. Mild HPTH). - Discussed about increased risk of fracture, depending on the T score, greatly increased when the T score is lower than -2.5, but it is actually a continuum and -2.5 should not be regarded as an absolute threshold. We reviewed her last 3 DEXA scan reports and images together, and I explained that based on the T scores in her left radius on last DEXA scan, she has an increased risk for fractures. However, we discussed that the last DEXA  scan was run on a different machine compared to the previous and for reason or the other the spine was not analyzed. On the previous exophthalmos, the radius was not analyzed. She mentions that she had another DEXA scan performed at [facility] more recently and we sent a release of information request to obtain the report. - She has been on Boniva for 3 years in the past which she tolerated well, but had to take acid reflux medications. As of now, she was recently started on teriparatide, which she does not tolerate very well due to dizziness, headache and constipation. We discussed that all of these are known possible side effects were teriparatide. As she has a history of mild hypercalcemia without suppressed PTH, we need to stop teriparatide now until we investigate further the hypercalcemia. I did explain that stopping teriparatide abruptly is not indicated so we'll bridge her with Fosamax until the investigation for her hypercalcemia is complete. If the investigation returns normal, we can either continue with the teriparatide (which she is not keen to) or we can switch to Prolia. We discussed about the different medication classes, benefits and side effects (including atypical fractures and ONJ - no dental workup in progress or planned). Pt was given reading information about Prolia, and I explained the mechanism of action and expected benefits.  - we reviewed her dietary and supplemental calcium and vitamin D intake. I recommended to make sure she gets 1000-1200 mg of calcium daily but we'll go ahead and stop her calcium supplement since she had hypercalcemia. We reviewed her most recent vit D level and this was normal thought advised her to continue current supplementation with 5000 units vitamin D daily - given her specific instructions about food sources for Calcium and Vitamin D - see pt instructions  - discussed fall precautions   - given handout from [facility]:  weight bearing exercises - advised to do this every day or at least 5/7 days - we discussed about maintaining a good amount of protein in her diet. The recommended daily protein intake is ~0.8 g per kilogram per day. I advised her to try to aim for this amount, since a diet low in proteins can exacerbate osteoporosis. Also, avoid smoking or >2 drinks of alcohol a day. - will check a new DEXA scan in 2 years after starting Teriparatide or Prolia -  I explained that the first indication that the treatment is working is her not having fractures. DEXA scan changes are secondary: unchanged or slightly higher T-scores are desirable - will see pt back in 3 months mostly for follow-up on her hypercalcemia  2. Hypercalcemia - Patient had 2 instances of elevated calcium level before starting teriparatide, in the presence of an on suppressed PTH - We discussed about investigating this further to see if she has primary hyperparathyroidism. The fact that her 33% distal radius bone density is the lowest and the fact that she  has had kidney stones would go along with this diagnosis. - Since hypercalcemia/hyperparathyroidism is a contraindication to Forteo I advised her to stop this right away - Will also need to stop HCTZ and switched to furosemide to avoid further hypercalcemia  - Will also stop her calcium supplements but advised her to get approximately 1000-1200 mg calcium from the diet (given list of possible dietary sources) - I would then have her come back in 2 weeks to check the following:  Orders Placed This Encounter  Procedures  . Vitamin D 1,25 dihydroxy  . Phosphorus  . Parathyroid hormone, intact (no Ca)  . Magnesium  . BASIC METABOLIC PANEL WITH GFR  . Calcium, ionized  - We discussed about the fact that she has primary hyperparathyroidism, especially in the presence of osteoporosis, I would suggest parathyroidectomy. She agrees with the referral to surgery in that case.  - time spent with the  patient: 1 hour, of which >50% was spent in obtaining information about her symptoms, reviewing her previous labs, evaluations, and treatments, counseling her about her conditions (please see the discussed topics above), and developing a plan to further investigate it; she had a number of questions which I addressed.  Addendum:  L1-L4 T score FN T score 33% distal Radius Ultra distal left radius   10/16/2016 (Hologic)  -0.5 RFN: -1.6 LFN: -1.9 -3.2 -1.9   Scores are better, but still low at 33% distal radius. Continue with plan above.   Philemon Kingdom, MD PhD [facility] Endocrinology

## 2016-12-08 NOTE — Patient Instructions (Signed)
Please come back for labs in 2 weeks.  Please stop HCTZ and start Furosemide 20 mg daily.  Stop Forteo for now. Start Fosamax 70 mg once a week.  Stop calcium but make sure you get 1000-1200 mg daily.  Please come back for a follow-up appointment in 3 months.  How Can I Prevent Falls? Men and women with osteoporosis need to take care not to fall down. Falls can break bones. Some reasons people fall are: Poor vision  Poor balance  Certain diseases that affect how you walk  Some types of medicine, such as sleeping pills.  Some tips to help prevent falls outdoors are: Use a cane or walker  Wear rubber-soled shoes so you don't slip  Walk on grass when sidewalks are slippery  In winter, put salt or kitty litter on icy sidewalks.  Some ways to help prevent falls indoors are: Keep rooms free of clutter, especially on floors  Use plastic or carpet runners on slippery floors  Wear low-heeled shoes that provide good support  Do not walk in socks, stockings, or slippers  Be sure carpets and area rugs have skid-proof backs or are tacked to the floor  Be sure stairs are well lit and have rails on both sides  Put grab bars on bathroom walls near tub, shower, and toilet  Use a rubber bath mat in the shower or tub  Keep a flashlight next to your bed  Use a sturdy step stool with a handrail and wide steps  Add more lights in rooms (and night lights) Buy a cordless phone to keep with you so that you don't have to rush to the phone       when it rings and so that you can call for help if you fall.   (adapted from http://www.niams.NightlifePreviews.se)  Dietary sources of calcium and vitamin D:  Calcium content (mg) - http://www.niams.MoviePins.co.za  Fortified oatmeal, 1 packet 350  Sardines, canned in oil, with edible bones, 3 oz. 324  Cheddar cheese, 1 oz. shredded 306  Milk, nonfat, 1 cup 302  Milkshake, 1 cup 300  Yogurt,  plain, low-fat, 1 cup 300  Soybeans, cooked, 1 cup 261  Tofu, firm, with calcium,  cup 204  Orange juice, fortified with calcium, 6 oz. 200-260 (varies)  Salmon, canned, with edible bones, 3 oz. 181  Pudding, instant, made with 2% milk,  cup 153  Baked beans, 1 cup [place], 1% milk fat, 1 cup 138  Spaghetti, lasagna, 1 cup 125  Frozen yogurt, vanilla, soft-serve,  cup 103  Ready-to-eat cereal, fortified with calcium, 1 cup 100-1,000 (varies)  Cheese pizza, 1 slice 761  Fortified waffles, 2 100  Turnip greens, boiled,  cup 99  Broccoli, raw, 1 cup 90  Ice cream, vanilla,  cup 85  Soy or rice milk, fortified with calcium, 1 cup 80-500 (varies)   Vitamin D content (International Units, IU) - https://www.ars.usda.gov Cod liver oil, 1 tablespoon 1,360  Swordfish, cooked, 3 oz 566  Salmon (sockeye), cooked, 3 oz 447  Tuna fish, canned in water, drained, 3 oz 154  Orange juice fortified with vitamin D, 1 cup (check product labels, as amount of added vitamin D varies) 137  Milk, nonfat, reduced fat, and whole, vitamin D-fortified, 1 cup 115-124  Yogurt, fortified with 20% of the daily value for vitamin D, 6 oz 80  Margarine, fortified, 1 tablespoon 60  Sardines, canned in oil, drained, 2 sardines 46  Liver, beef, cooked, 3 oz 42  Egg, 1 large (vitamin D is found in yolk) 41  Ready-to-eat cereal, fortified with 10% of the daily value for vitamin D, 0.75-1 cup  40  Cheese, Swiss, 1 oz 6   Exercise for Strong Bones (from [place]) There are two types of exercises that are important for building and maintaining bone density:  weight-bearing and muscle-strengthening exercises. Weight-bearing Exercises These exercises include activities that make you move against gravity while staying upright. Weight-bearing exercises can be high-impact or low-impact. High-impact weight-bearing exercises help build bones and keep them strong. If you have broken a bone  due to osteoporosis or are at risk of breaking a bone, you may need to avoid high-impact exercises. If youre not sure, you should check with your healthcare provider. Examples of high-impact weight-bearing exercises are:  Dancing  Doing high-impact aerobics  Hiking  Jogging/running  Jumping Rope  Stair climbing  Tennis Low-impact weight-bearing exercises can also help keep bones strong and are a safe alternative if you cannot do high-impact exercises. Examples of low-impact weight-bearing exercises are:  Using elliptical training machines  Doing low-impact aerobics  Using stair-step machines  Fast walking on a treadmill or outside Muscle-Strengthening Exercises These exercises include activities where you move your body, a weight or some other resistance against gravity. They are also known as resistance exercises and include:  Lifting weights  Using elastic exercise bands  Using weight machines  Lifting your own body weight  Functional movements, such as standing and rising up on your toes Yoga and Pilates can also improve strength, balance and flexibility. However, certain positions may not be safe for people with osteoporosis or those at increased risk of broken bones. For example, exercises that have you bend forward may increase the chance of breaking a bone in the spine. A physical therapist should be able to help you learn which exercises are safe and appropriate for you. Non-Impact Exercises Non-impact exercises can help you to improve balance, posture and how well you move in everyday activities. These exercises can also help to increase muscle strength and decrease the risk of falls and broken bones. Some of these exercises include:  Balance exercises that strengthen your legs and test your balance, such as Tai Chi, can decrease your risk of falls.  Posture exercises that improve your posture and reduce rounded or sloping shoulders can help you decrease the  chance of breaking a bone, especially in the spine.  Functional exercises that improve how well you move can help you with everyday activities and decrease your chance of falling and breaking a bone. For example, if you have trouble getting up from a chair or climbing stairs, you should do these activities as exercises. A physical therapist can teach you balance, posture and functional exercises. Starting a New Exercise Program If you havent exercised regularly for a while, check with your healthcare provider before beginning a new exercise program--particularly if you have health problems such as heart disease, diabetes or high blood pressure. If youre at high risk of breaking a bone, you should work with a physical therapist to develop a safe exercise program. Once you have your healthcare providers approval, start slowly. If youve already broken bones in the spine because of osteoporosis, be very careful to avoid activities that require reaching down, bending forward, rapid twisting motions, heavy lifting and those that increase your chance of a fall. As you get started, your muscles may feel sore for a day or two after you exercise. If soreness lasts  longer, you may be working too hard and need to ease up. Exercises should be done in a pain-free range of motion. How Much Exercise Do You Need? Weight-bearing exercises 30 minutes on most days of the week. Do a 30-minutesession or multiple sessions spread out throughout the day. The benefits to your bones are the same.   Muscle-strengthening exercises Two to three days per week. If you dont have much time for strengthening/resistance training, do small amounts at a time. You can do just one body part each day. For example do arms one day, legs the next and trunk the next. You can also spread these exercises out during your normal day.  Balance, posture and functional exercises Every day or as often as needed. You may want to focus on one area more  than the others. If you have fallen or lose your balance, spend time doing balance exercises. If you are getting rounded shoulders, work more on posture exercises. If you have trouble climbing stairs or getting up from the couch, do more functional exercises. You can also perform these exercises at one time or spread them during your day. Work with a phyiscal therapist to learn the right exercises for you.    Denosumab: Patient drug information (Up-to-date) Copyright [phone] [place] rights reserved.  Brand Names: [place]  ProliaDelton See What is this drug used for?  It is used to treat soft, brittle bones (osteoporosis).  It is used for bone growth.  It is used when treating some cancers.  It may be given to you for other reasons. Talk with the doctor. What do I need to tell my doctor BEFORE I take this drug?  All products:  If you have an allergy to denosumab or any other part of this drug.  If you are allergic to any drugs like this one, any other drugs, foods, or other substances. Tell your doctor about the allergy and what signs you had, like rash; hives; itching; shortness of breath; wheezing; cough; swelling of face, lips, tongue, or throat; or any other signs.  If you have low calcium levels.  Prolia:  If you are pregnant or may be pregnant. Do not take this drug if you are pregnant.  This is not a list of all drugs or health problems that interact with this drug.  Tell your doctor and pharmacist about all of your drugs (prescription or OTC, natural products, vitamins) and health problems. You must check to make sure that it is safe for you to take this drug with all of your drugs and health problems. Do not start, stop, or change the dose of any drug without checking with your doctor. What are some things I need to know or do while I take this drug?  All products:  Tell dentists, surgeons, and other doctors that you use this drug.  This drug may raise the chance  of a broken leg. Talk with your doctor.  Have your blood work checked. Talk with your doctor.  Have a bone density test. Talk with your doctor.  Take calcium and vitamin D as you were told by your doctor.  Have a dental exam before starting this drug.  Take good care of your teeth. See a dentist often.  If you smoke, talk with your doctor.  Do not give to a child. Talk with your doctor.  Tell your doctor if you are breast-feeding. You will need to talk about any risks to your baby.  Delton See:  This drug may cause harm to the unborn baby if you take it while you are pregnant. If you get pregnant while taking this drug, call your doctor right away.  Prolia:  Very bad infections have been reported with use of this drug. If you have any infection, are taking antibiotics now or in the recent past, or have many infections, talk with your doctor.  You may have more chance of getting an infection. Wash hands often. Stay away from people with infections, colds, or flu.  Use birth control that you can trust to prevent pregnancy while taking this drug.  If you are a man and your sex partner is pregnant or gets pregnant at any time while you are being treated, talk with your doctor. What are some side effects that I need to call my doctor about right away?  WARNING/CAUTION: Even though it may be rare, some people may have very bad and sometimes deadly side effects when taking a drug. Tell your doctor or get medical help right away if you have any of the following signs or symptoms that may be related to a very bad side effect:  All products:  Signs of an allergic reaction, like rash; hives; itching; red, swollen, blistered, or peeling skin with or without fever; wheezing; tightness in the chest or throat; trouble breathing or talking; unusual hoarseness; or swelling of the mouth, face, lips, tongue, or throat.  Signs of low calcium levels like muscle cramps or spasms, numbness and tingling, or  seizures.  Mouth sores.  Any new or strange groin, hip, or thigh pain.  This drug may cause jawbone problems. The chance may be higher the longer you take this drug. The chance may be higher if you have cancer, dental problems, dentures that do not fit well, anemia, blood clotting problems, or an infection. The chance may also be higher if you are having dental work or if you are getting chemo, some steroid drugs, or radiation. Call your doctor right away if you have jaw swelling or pain.  Xgeva:  Not hungry.  Muscle pain or weakness.  Seizures.  Shortness of breath.  Prolia:  Signs of infection. These include a fever of 100.1F (38C) or higher, chills, very bad sore throat, ear or sinus pain, cough, more sputum or change in color of sputum, pain with passing urine, mouth sores, wound that will not heal, or anal itching or pain.  Signs of a pancreas problem (pancreatitis) like very bad stomach pain, very bad back pain, or very bad upset stomach or throwing up.  Chest pain.  A heartbeat that does not feel normal.  Very bad skin irritation.  Feeling very tired or weak.  Bladder pain or pain when passing urine or change in how much urine is passed.  Passing urine often.  Swelling in the arms or legs. What are some other side effects of this drug?  All drugs may cause side effects. However, many people have no side effects or only have minor side effects. Call your doctor or get medical help if any of these side effects or any other side effects bother you or do not go away:  Xgeva:  Feeling tired or weak.  Headache.  Upset stomach or throwing up.  Loose stools (diarrhea).  Cough.  Prolia:  Back pain.  Muscle or joint pain.  Sore throat.  Runny nose.  Pain in arms or legs.  These are not all of the side effects that may occur. If you have  questions about side effects, call your doctor. Call your doctor for medical advice about side effects.  You may report  side effects to your national health agency. How is this drug best taken?  Use this drug as ordered by your doctor. Read and follow the dosing on the label closely.  It is given as a shot into the fatty part of the skin. What do I do if I miss a dose?  Call the doctor to find out what to do. How do I store and/or throw out this drug?  This drug will be given to you in a hospital or doctor's office. You will not store it at home.  Keep all drugs out of the reach of children and pets.  Check with your pharmacist about how to throw out unused drugs.  General drug facts  If your symptoms or health problems do not get better or if they become worse, call your doctor.  Do not share your drugs with others and do not take anyone else's drugs.  Keep a list of all your drugs (prescription, natural products, vitamins, OTC) with you. Give this list to your doctor.  Talk with the doctor before starting any new drug, including prescription or OTC, natural products, or vitamins.  Some drugs may have another patient information leaflet. If you have any questions about this drug, please talk with your doctor, pharmacist, or other health care provider.  If you think there has been an overdose, call your poison control center or get medical care right away. Be ready to tell or show what was taken, how much, and when it happened.

## 2016-12-09 ENCOUNTER — Ambulatory Visit (INDEPENDENT_AMBULATORY_CARE_PROVIDER_SITE_OTHER): Payer: Medicare Other | Admitting: Internal Medicine

## 2016-12-09 ENCOUNTER — Encounter: Payer: Self-pay | Admitting: Internal Medicine

## 2016-12-09 VITALS — BP 122/78 | HR 70 | Temp 97.9°F | Ht 63.0 in | Wt 167.1 lb

## 2016-12-09 DIAGNOSIS — R1032 Left lower quadrant pain: Secondary | ICD-10-CM

## 2016-12-09 DIAGNOSIS — R197 Diarrhea, unspecified: Secondary | ICD-10-CM | POA: Diagnosis not present

## 2016-12-09 DIAGNOSIS — R5383 Other fatigue: Secondary | ICD-10-CM | POA: Diagnosis not present

## 2016-12-09 DIAGNOSIS — M810 Age-related osteoporosis without current pathological fracture: Secondary | ICD-10-CM | POA: Diagnosis not present

## 2016-12-09 LAB — POCT URINALYSIS DIPSTICK
BILIRUBIN UA: NEGATIVE
Glucose, UA: NEGATIVE
Ketones, UA: NEGATIVE
Leukocytes, UA: NEGATIVE
NITRITE UA: NEGATIVE
PH UA: 6 (ref 5.0–8.0)
PROTEIN UA: NEGATIVE
RBC UA: NEGATIVE
Spec Grav, UA: >=1.03 — AB (ref 1.010–1.025)
UROBILINOGEN UA: 0.2 U/dL

## 2016-12-09 NOTE — Patient Instructions (Signed)
It is possible that the  Antibiotic could cause diarrhea also .   But stay   On antibiotic  Cause the  Story does sound like  diverticulitis and your exam is reassuring.  And pain is improved .   Dont think the forteo is doing this  .    Add  Probiotic   culturelle or fluoirastor ( sp)    That may help with diarrhea .  Avoid  immodium at this time if possible .   If you get fever  Serious pain or blood  Contact emergent care or ED evaluation  Keep appt with  GI next week.   Call them in interim  If needed as they should be able to see  These notes in EPIC.     Diverticulitis Diverticulitis is inflammation or infection of small pouches in your colon that form when you have a condition called diverticulosis. The pouches in your colon are called diverticula. Your colon, or large intestine, is where water is absorbed and stool is formed. Complications of diverticulitis can include:  Bleeding.  Severe infection.  Severe pain.  Perforation of your colon.  Obstruction of your colon. What are the causes? Diverticulitis is caused by bacteria. Diverticulitis happens when stool becomes trapped in diverticula. This allows bacteria to grow in the diverticula, which can lead to inflammation and infection. What increases the risk? People with diverticulosis are at risk for diverticulitis. Eating a diet that does not include enough fiber from fruits and vegetables may make diverticulitis more likely to develop. What are the signs or symptoms? Symptoms of diverticulitis may include:  Abdominal pain and tenderness. The pain is normally located on the left side of the abdomen, but may occur in other areas.  Fever and chills.  Bloating.  Cramping.  Nausea.  Vomiting.  Constipation.  Diarrhea.  Blood in your stool. How is this diagnosed? Your health care provider will ask you about your medical history and do a physical exam. You may need to have tests done because many medical  conditions can cause the same symptoms as diverticulitis. Tests may include:  Blood tests.  Urine tests.  Imaging tests of the abdomen, including X-rays and CT scans. When your condition is under control, your health care provider may recommend that you have a colonoscopy. A colonoscopy can show how severe your diverticula are and whether something else is causing your symptoms. How is this treated? Most cases of diverticulitis are mild and can be treated at home. Treatment may include:  Taking over-the-counter pain medicines.  Following a clear liquid diet.  Taking antibiotic medicines by mouth for 7-10 days. More severe cases may be treated at a hospital. Treatment may include:  Not eating or drinking.  Taking prescription pain medicine.  Receiving antibiotic medicines through an IV tube.  Receiving fluids and nutrition through an IV tube.  Surgery. Follow these instructions at home:  Follow your health care provider's instructions carefully.  Follow a full liquid diet or other diet as directed by your health care provider. After your symptoms improve, your health care provider may tell you to change your diet. He or she may recommend you eat a high-fiber diet. Fruits and vegetables are good sources of fiber. Fiber makes it easier to pass stool.  Take fiber supplements or probiotics as directed by your health care provider.  Only take medicines as directed by your health care provider.  Keep all your follow-up appointments. Contact a health care provider if:  Your  pain does not improve.  You have a hard time eating food.  Your bowel movements do not return to normal. Get help right away if:  Your pain becomes worse.  Your symptoms do not get better.  Your symptoms suddenly get worse.  You have a fever.  You have repeated vomiting.  You have bloody or black, tarry stools. This information is not intended to replace advice given to you by your health care  provider. Make sure you discuss any questions you have with your health care provider. Document Released: 04/26/2005 Document Revised: 12/23/2015 Document Reviewed: 06/11/2013 Elsevier Interactive Patient Education  2017 [employer].

## 2016-12-09 NOTE — Progress Notes (Signed)
Chief Complaint  Patient presents with  . Diarrhea    Since Wednesday. Started Cipro and Flagyl on Tuesday.     HPI: [name] 71 y.o.      Patient comes in today for [facility] Saturday clinic for   problem evaluation.   Under care  Gi for poss diverticulitis  rx over the phone for  llq pains doubled over and  Bloating  Gi    meds are flagyl and cipro   Says   dont feel good in general.  And after constipation  Now has diarrhea and wants to make sure all ok .   Baseline bid    bm... then with pain  Had 5 day  w no bm  And took  mirilax  X 1    About 3 days ago. Only one bm next day then   Loose next day and now watery diarrhea  Soon after .  Last 24  Hours  3 x this am .  No blood .  No fever  Feels lots of fatigue  More than usual .  lwer abd pain is much better   Has fu appt in office GI this week .   Asks if forteo could do this  Seen  Specialist at [facility]   ROS: See pertinent positives and negatives per HPI. No cp sob  Hx of renal ston in past  No dysuria no vomiting  Chills rash   Past Medical History:  Diagnosis Date  . Allergy    seasonal  . Ankylosing spondylitis ([place])   . Anxiety   . Arthritis   . Bell palsy   . CTS (carpal tunnel syndrome)   . Cystitis   . Diverticulitis   . Diverticulosis of colon   . Dry eye syndrome   . Frozen shoulder   . GERD (gastroesophageal reflux disease)   . HLA B27 (HLA B27 positive)   . Hx of colonic polyps   . Hyperlipidemia    NMR 2005  . Hypertension   . Hypothyroidism   . Incontinence   . Internal hemorrhoids   . Interstitial cystitis   . Kidney stone   . Menopause   . Osteoarthritis   . Osteopenia    [facility]  . S/P total hysterectomy and bilateral salpingo-oophorectomy 04/12/2014  . Sleep apnea   . Subjective visual disturbance of both eyes 12/30/2013  . Tubular adenoma of colon   . Vitamin D deficiency   . Xerostomia    and Xeroophthalmia    Family History  Problem Relation Age of Onset   . Coronary artery disease Mother   . Hypertension Mother   . Asthma Mother   . Osteoporosis Mother   . Dementia Mother   . Stroke Mother        mini cva  . Diabetes Father   . Cirrhosis Father        non alcoholic  . Cancer Sister        colon  . Colon cancer Sister 22  . Cancer Paternal Aunt        2 aunts had renal cancer  . Coronary artery disease Maternal Grandfather   . Cancer Maternal Aunt        breast  . Diabetes Paternal Aunt   . Diabetes Paternal Grandmother     Social History   Social History  . Marital status: Married    Spouse name: N/A  . Number of children:  1  . Years of education: BS   Occupational History  . retired   . teacher     Kindergarten   Social History Main Topics  . Smoking status: Never Smoker  . Smokeless tobacco: Never Used  . Alcohol use Yes     Comment: occ  . Drug use: No  . Sexual activity: Not Currently   Other Topics Concern  . Not on file   Social History Narrative   Married          Outpatient Medications Prior to Visit  Medication Sig Dispense Refill  . acetaminophen (TYLENOL) 500 MG tablet Take by mouth.    . Ascorbic Acid (VITAMIN C) 1000 MG tablet Take by mouth.    Marland Kitchen aspirin 81 MG tablet Take 81 mg by mouth daily.      . blood glucose meter kit and supplies KIT Dispense based on patient and insurance preference. Use up to four times daily as directed. prediabetes 1 each 0  . Calcium Carbonate-Vit D-Min (CALCIUM 1200 PO) Take 1,200 mg by mouth 2 (two) times daily.    . cetirizine (ZYRTEC) 10 MG tablet Take 10 mg by mouth daily.      . Cholecalciferol (VITAMIN D3) 5000 units TABS Take 5,000 mg by mouth daily.    . ciprofloxacin (CIPRO) 500 MG tablet Take 1 tablet (500 mg total) by mouth 2 (two) times daily. 14 tablet 0  . Diclofenac Sodium 2 % SOLN Place onto the skin as directed for pain as needed.    . furosemide (LASIX) 20 MG tablet Take 1 tablet (20 mg total) by mouth daily. 90 tablet 3  . meloxicam (MOBIC)  15 MG tablet Take 1 tablet (15 mg total) by mouth daily as needed for pain. With food 90 tablet 2  . metoprolol succinate (TOPROL XL) 25 MG 24 hr tablet Take 1 tablet (25 mg total) by mouth daily. 90 tablet 3  . metroNIDAZOLE (FLAGYL) 500 MG tablet Take 1 tablet (500 mg total) by mouth 3 (three) times daily. 21 tablet 0  . Misc Natural Products (COSAMIN ASU ADVANCED FORMULA PO) Take 2 each by mouth daily.    . Omega-3 Fatty Acids (FISH OIL BURP-LESS) 1000 MG CAPS Takes 1000 mg BID 60 capsule 0  . ondansetron (ZOFRAN ODT) 4 MG disintegrating tablet Take 1 tablet (4 mg total) by mouth every 8 (eight) hours as needed for nausea or vomiting. 20 tablet 0  . Polyethyl Glycol-Propyl Glycol (SYSTANE OP) Apply 1 drop to eye as needed. Apply to both eyes as needed    . ranitidine (ZANTAC) 150 MG tablet Take 1 tablet (150 mg total) by mouth 2 (two) times daily. 60 tablet 5  . rosuvastatin (CRESTOR) 5 MG tablet Take 1 tablet (5 mg total) by mouth daily. 30 tablet 1  . Teriparatide, Recombinant, 600 MCG/2.4ML SOLN Inject into the skin.    Marland Kitchen vitamin B-12 (CYANOCOBALAMIN) 1000 MCG tablet Take by mouth.    . vitamin E 400 UNIT capsule Take 400 Units by mouth daily.    Marland Kitchen alendronate (FOSAMAX) 70 MG tablet Take 1 tablet (70 mg total) by mouth every 7 (seven) days. Take with a full glass of water on an empty stomach. (Patient not taking: Reported on 12/09/2016) 4 tablet 11   No facility-administered medications prior to visit.      EXAM:  BP 122/78   Pulse 70   Temp 97.9 F (36.6 C) (Oral)   Ht 5' 3"  (1.6 m)  Wt 167 lb 1.3 oz (75.8 kg)   SpO2 98%   BMI 29.60 kg/m   Body mass index is 29.6 kg/m.  GENERAL: vitals reviewed and listed above, alert, oriented, appears well hydrated and in no acute distress HEENT: atraumatic, conjunctiva  clear, no obvious abnormalities on inspection of external nose and ears OP : no lesion edema or exudate  NECK: no obvious masses on inspection palpation  LUNGS: clear to  auscultation bilaterally, no wheezes, rales or rhonchi, good air movement CV: HRRR, no clubbing cyanosis or  peripheral edema nl cap refill  Abdomen:  Sof,t normal bowel sounds without hepatosplenomegaly, no guarding rebound or masses no CVA tenderness MS: moves all extremities without noticeable focal  abnormality PSYCH: pleasant and cooperative, no obvious depression or anxiety ua clear  ASSESSMENT AND PLAN:  Discussed the following assessment and plan:  LLQ pain - poss diverticulitisunder rx  per dr Hilarie Fredrickson - Plan: POCT Urinalysis Dipstick  Other fatigue  Diarrhea in adult patient  Osteoporosis without current pathological fracture, unspecified osteoporosis type - on forteo Sx c/w diverticulitis at onset and now with diarrhea after constipation  At this time has 2 more days of meds and to finish if tolerated  . And keep fu  GI  Who ordered these meds but can call then  On Monday  For advice if needed  Reviewed alarm findings  Exam is reassuring today but is much better as far as the  Pain goes .   Expectant management.  -Patient advised to return or notify health care team  if symptoms worsen ,persist or new concerns arise.  Patient Instructions  It is possible that the  Antibiotic could cause diarrhea also .   But stay   On antibiotic  Cause the  Story does sound like  diverticulitis and your exam is reassuring.  And pain is improved .   Dont think the forteo is doing this  .    Add  Probiotic   culturelle or fluoirastor ( sp)    That may help with diarrhea .  Avoid  immodium at this time if possible .   If you get fever  Serious pain or blood  Contact emergent care or ED evaluation  Keep appt with  GI next week.   Call them in interim  If needed as they should be able to see  These notes in EPIC.     Diverticulitis Diverticulitis is inflammation or infection of small pouches in your colon that form when you have a condition called diverticulosis. The pouches in your colon are  called diverticula. Your colon, or large intestine, is where water is absorbed and stool is formed. Complications of diverticulitis can include:  Bleeding.  Severe infection.  Severe pain.  Perforation of your colon.  Obstruction of your colon. What are the causes? Diverticulitis is caused by bacteria. Diverticulitis happens when stool becomes trapped in diverticula. This allows bacteria to grow in the diverticula, which can lead to inflammation and infection. What increases the risk? People with diverticulosis are at risk for diverticulitis. Eating a diet that does not include enough fiber from fruits and vegetables may make diverticulitis more likely to develop. What are the signs or symptoms? Symptoms of diverticulitis may include:  Abdominal pain and tenderness. The pain is normally located on the left side of the abdomen, but may occur in other areas.  Fever and chills.  Bloating.  Cramping.  Nausea.  Vomiting.  Constipation.  Diarrhea.  Blood in your  stool. How is this diagnosed? Your health care provider will ask you about your medical history and do a physical exam. You may need to have tests done because many medical conditions can cause the same symptoms as diverticulitis. Tests may include:  Blood tests.  Urine tests.  Imaging tests of the abdomen, including X-rays and CT scans. When your condition is under control, your health care provider may recommend that you have a colonoscopy. A colonoscopy can show how severe your diverticula are and whether something else is causing your symptoms. How is this treated? Most cases of diverticulitis are mild and can be treated at home. Treatment may include:  Taking over-the-counter pain medicines.  Following a clear liquid diet.  Taking antibiotic medicines by mouth for 7-10 days. More severe cases may be treated at a hospital. Treatment may include:  Not eating or drinking.  Taking prescription pain  medicine.  Receiving antibiotic medicines through an IV tube.  Receiving fluids and nutrition through an IV tube.  Surgery. Follow these instructions at home:  Follow your health care provider's instructions carefully.  Follow a full liquid diet or other diet as directed by your health care provider. After your symptoms improve, your health care provider may tell you to change your diet. He or she may recommend you eat a high-fiber diet. Fruits and vegetables are good sources of fiber. Fiber makes it easier to pass stool.  Take fiber supplements or probiotics as directed by your health care provider.  Only take medicines as directed by your health care provider.  Keep all your follow-up appointments. Contact a health care provider if:  Your pain does not improve.  You have a hard time eating food.  Your bowel movements do not return to normal. Get help right away if:  Your pain becomes worse.  Your symptoms do not get better.  Your symptoms suddenly get worse.  You have a fever.  You have repeated vomiting.  You have bloody or black, tarry stools. This information is not intended to replace advice given to you by your health care provider. Make sure you discuss any questions you have with your health care provider. Document Released: 04/26/2005 Document Revised: 12/23/2015 Document Reviewed: 06/11/2013 Elsevier Interactive Patient Education  2017 Sikes K. Panosh M.D.

## 2016-12-11 MED FILL — ROSUVASTATIN CALCIUM 5 MG T: 5 | 30 days supply | Qty: 30 | Fill #1

## 2016-12-11 MED FILL — raNITIdine HCL 150 MG TABS: 150 | 30 days supply | Qty: 60 | Fill #1

## 2016-12-13 ENCOUNTER — Ambulatory Visit (INDEPENDENT_AMBULATORY_CARE_PROVIDER_SITE_OTHER): Payer: Medicare Other | Admitting: Physician Assistant

## 2016-12-13 ENCOUNTER — Encounter: Payer: Self-pay | Admitting: Physician Assistant

## 2016-12-13 VITALS — BP 110/72 | Ht 63.0 in | Wt 168.5 lb

## 2016-12-13 DIAGNOSIS — R110 Nausea: Secondary | ICD-10-CM

## 2016-12-13 DIAGNOSIS — K5732 Diverticulitis of large intestine without perforation or abscess without bleeding: Secondary | ICD-10-CM | POA: Diagnosis not present

## 2016-12-13 MED ORDER — HYOSCYAMINE SULFATE 0.125 MG SL SUBL: 0.1250 mg | SUBLINGUAL_TABLET | SUBLINGUAL | 1 refills | Status: DC | PRN

## 2016-12-13 MED FILL — OSCIMIN SL 0.125 MG TABLET: 0.125 | 3 days supply | Qty: 15 | Fill #0

## 2016-12-13 NOTE — Patient Instructions (Signed)
We have given you a high and low fiber diet handout. Please strive to have 25-30 grams of fiber daily.

## 2016-12-13 NOTE — Progress Notes (Addendum)
Chief Complaint: Diverticulitis  HPI:   [name] is a 71 year old Caucasian female with a past medical history as listed below, who regularly follows with Dr. Hilarie Fredrickson and presents to clinic today to discuss her recent diagnosis of diverticulitis.   Per chart review patient started with left lower quadrant abdominal pain which "doubled her over" in 12/04/16 and reports staying in bed all weekend. After review of symptoms, Dr. Hilarie Fredrickson discussed diverticulitis and sent in a prescription for Cipro and Flagyl for 7 days. Of note patient had a recent colonoscopy 01/27/16 with 7 polyps removed ranging in size from 3-8 mm which were tubular adenomas as well as multiple diverticula in the sigmoid colon and internal hemorrhoids.   Today, the patient presents to clinic and tells me that a week ago Friday she started with a left lower quadrant abdominal pain which "doubled me over", this left her in bed over the weekend and seemed to spread across her lower abdomen. After starting the antibiotics as prescribed above she has had a decrease in pain noting that this is now a 3/10. She does have one more day of Flagyl left because she missed a couple of doses but has finished her Cipro as of today. She does have occasional nausea ever since starting the antibiotics and notes that she has been bloated after eating. She has also developed some loose stool after starting antibiotics and did have some chills initially, but never a fever. Everything has seemed to be getting better since starting the antibiotics and she denies ever seeing bright red blood in her stool or having episodes of vomiting. Patient does describe that she has chronic constipation for which she has been using "fish oil tablets", which seem to give her stools twice a day and this was working for her to have a bowel movement every day, even before onset of symptoms, she denies a change in diet before onset of symptoms or becoming dehydrated. She has multiple  questions regarding the diagnosis of diverticulitis today.   Patient also describes other medical concerns including fatigue which has seemed to worsen during this episode and medicine she is taking for osteoporosis.   Patient denies fever, blood in her stool, melena, weight loss, anorexia, heartburn, reflux or symptoms that awaken her at night.  Past Medical History:  Diagnosis Date  . Allergy    seasonal  . Ankylosing spondylitis ([place])   . Anxiety   . Arthritis   . Bell palsy   . CTS (carpal tunnel syndrome)   . Cystitis   . Diverticulitis   . Diverticulosis of colon   . Dry eye syndrome   . Frozen shoulder   . GERD (gastroesophageal reflux disease)   . HLA B27 (HLA B27 positive)   . Hx of colonic polyps   . Hyperlipidemia    NMR 2005  . Hypertension   . Hypothyroidism   . Incontinence   . Internal hemorrhoids   . Interstitial cystitis   . Kidney stone   . Menopause   . Osteoarthritis   . Osteopenia    [facility]  . S/P total hysterectomy and bilateral salpingo-oophorectomy 04/12/2014  . Sleep apnea   . Subjective visual disturbance of both eyes 12/30/2013  . Tubular adenoma of colon   . Vitamin D deficiency   . Xerostomia    and Xeroophthalmia    Past Surgical History:  Procedure Laterality Date  . ABDOMINAL HYSTERECTOMY    . BLADDER SURGERY  for incontinence  . CARPAL TUNNEL RELEASE Left 12/22/2014   Procedure: LEFT CARPAL TUNNEL RELEASE;  Surgeon: Daryll Brod, MD;  Location: [facility];  Service: Orthopedics;  Laterality: Left;  . COLONOSCOPY W/ POLYPECTOMY     adenomatous poyp 04-2008  . colonscopy     tics 10-2002  . CYSTOSCOPY WITH RETROGRADE PYELOGRAM, URETEROSCOPY AND STENT PLACEMENT Left 09/01/2013   Procedure: CYSTOSCOPY WITH RETROGRADE PYELOGRAM, AND LEFT STENT PLACEMENT;  Surgeon: Molli Hazard, MD;  Location: [facility];  Service: Urology;  Laterality: Left;  . elevated LFTs     due to [place]  . FINGER  SURGERY Left 2004   2nd finger  . G 1 P 1    . interstitial cystitis     with clot in catheter  . SHOULDER ARTHROSCOPY Left   . TOTAL ABDOMINAL HYSTERECTOMY W/ BILATERAL SALPINGOOPHORECTOMY     dysfunctional menses    Current Outpatient Prescriptions  Medication Sig Dispense Refill  . acetaminophen (TYLENOL) 500 MG tablet Take by mouth.    . Ascorbic Acid (VITAMIN C) 1000 MG tablet Take by mouth.    Marland Kitchen aspirin 81 MG tablet Take 81 mg by mouth daily.      . blood glucose meter kit and supplies KIT Dispense based on patient and insurance preference. Use up to four times daily as directed. prediabetes 1 each 0  . Calcium Carbonate-Vit D-Min (CALCIUM 1200 PO) Take 1,200 mg by mouth 2 (two) times daily.    . cetirizine (ZYRTEC) 10 MG tablet Take 10 mg by mouth daily.      . Cholecalciferol (VITAMIN D3) 5000 units TABS Take 5,000 mg by mouth daily.    . Diclofenac Sodium 2 % SOLN Place onto the skin as directed for pain as needed.    . furosemide (LASIX) 20 MG tablet Take 1 tablet (20 mg total) by mouth daily. 90 tablet 3  . meloxicam (MOBIC) 15 MG tablet Take 1 tablet (15 mg total) by mouth daily as needed for pain. With food 90 tablet 2  . metoprolol succinate (TOPROL XL) 25 MG 24 hr tablet Take 1 tablet (25 mg total) by mouth daily. 90 tablet 3  . metroNIDAZOLE (FLAGYL) 500 MG tablet Take 1 tablet (500 mg total) by mouth 3 (three) times daily. 21 tablet 0  . Misc Natural Products (COSAMIN ASU ADVANCED FORMULA PO) Take 2 each by mouth daily.    . Omega-3 Fatty Acids (FISH OIL BURP-LESS) 1000 MG CAPS Takes 1000 mg BID 60 capsule 0  . ondansetron (ZOFRAN ODT) 4 MG disintegrating tablet Take 1 tablet (4 mg total) by mouth every 8 (eight) hours as needed for nausea or vomiting. 20 tablet 0  . Polyethyl Glycol-Propyl Glycol (SYSTANE OP) Apply 1 drop to eye as needed. Apply to both eyes as needed    . ranitidine (ZANTAC) 150 MG tablet Take 1 tablet (150 mg total) by mouth 2 (two) times daily. 60  tablet 5  . rosuvastatin (CRESTOR) 5 MG tablet Take 1 tablet (5 mg total) by mouth daily. 30 tablet 1  . Teriparatide, Recombinant, 600 MCG/2.4ML SOLN Inject into the skin.    Marland Kitchen vitamin B-12 (CYANOCOBALAMIN) 1000 MCG tablet Take by mouth.    . vitamin E 400 UNIT capsule Take 400 Units by mouth daily.    . hyoscyamine (LEVSIN SL) 0.125 MG SL tablet Place 1 tablet (0.125 mg total) under the tongue every 4 (four) hours as needed. 15 tablet 1   No current facility-administered medications  for this visit.     Allergies as of 12/13/2016 - Review Complete 12/13/2016  Allergen Reaction Noted  . Nitroglycerin Anaphylaxis 04/12/2011  . Pentosan polysulfate sodium  04/12/2011  . Bactrim [sulfamethoxazole-trimethoprim]  04/12/2014  . Promethazine hcl    . Lisinopril Cough   . Sulfamethoxazole-trimethoprim Nausea Only 09/12/2016    Family History  Problem Relation Age of Onset  . Coronary artery disease Mother   . Hypertension Mother   . Asthma Mother   . Osteoporosis Mother   . Dementia Mother   . Stroke Mother        mini cva  . Diabetes Father   . Cirrhosis Father        non alcoholic  . Cancer Sister        colon  . Colon cancer Sister 44  . Cancer Paternal Aunt        2 aunts had renal cancer  . Coronary artery disease Maternal Grandfather   . Cancer Maternal Aunt        breast  . Diabetes Paternal Aunt   . Diabetes Paternal Grandmother     Social History   Social History  . Marital status: Married    Spouse name: N/A  . Number of children: 1  . Years of education: BS   Occupational History  . retired   . teacher     Kindergarten   Social History Main Topics  . Smoking status: Never Smoker  . Smokeless tobacco: Never Used  . Alcohol use Yes     Comment: occ  . Drug use: No  . Sexual activity: Not Currently   Other Topics Concern  . Not on file   Social History Narrative   Married          Review of Systems:    Constitutional: No weight loss or  fever Cardiovascular: No chest pain   Respiratory: No SOB  Gastrointestinal: See HPI and otherwise negative   Physical Exam:  Vital signs: BP 110/72   Ht 5' 3" (1.6 m)   Wt 168 lb 8 oz (76.4 kg)   BMI 29.85 kg/m   Constitutional:   Pleasant overweight Caucasian female appears to be in NAD, Well developed, Well nourished, alert and cooperative Head:  Normocephalic and atraumatic. Eyes:   PEERL, EOMI. No icterus. Conjunctiva pink. Ears:  Normal auditory acuity. Neck:  Supple Throat: Oral cavity and pharynx without inflammation, swelling or lesion.  Respiratory: Respirations even and unlabored. Lungs clear to auscultation bilaterally.   No wheezes, crackles, or rhonchi.  Cardiovascular: Normal S1, S2. No MRG. Regular rate and rhythm. No peripheral edema, cyanosis or pallor.  Gastrointestinal:  Soft, nondistended, mild LLQ abdominal tenderness to midline No rebound or guarding. Normal bowel sounds. No appreciable masses or hepatomegaly. Rectal:  Not performed.  Msk:  Symmetrical without gross deformities. Without edema, no deformity or joint abnormality.  Neurologic:  Alert and  oriented x4;  grossly normal neurologically.  Skin:   Dry and intact without significant lesions or rashes. Psychiatric:  Demonstrates good judgement and reason without abnormal affect or behaviors.  MOST RECENT LABS AND IMAGING: CBC    Component Value Date/Time   WBC 6.7 10/12/2016 2156   RBC 3.94 10/12/2016 2156   HGB 11.9 (L) 10/12/2016 2156   HCT 35.1 (L) 10/12/2016 2156   PLT 237 10/12/2016 2156   MCV 89.1 10/12/2016 2156   MCH 30.2 10/12/2016 2156   MCHC 33.9 10/12/2016 2156   RDW 12.5 10/12/2016 2156  LYMPHSABS 1.6 05/02/2016 0919   MONOABS 0.5 05/02/2016 0919   EOSABS 0.2 05/02/2016 0919   BASOSABS 0.0 05/02/2016 0919    CMP     Component Value Date/Time   NA 138 12/07/2016 0741   K 3.9 12/07/2016 0741   CL 95 (L) 12/07/2016 0741   CO2 24 12/07/2016 0741   GLUCOSE 111 (H) 12/07/2016  0741   GLUCOSE 115 (H) 10/31/2016 0903   GLUCOSE 99 07/23/2006 1101   BUN 20 12/07/2016 0741   CREATININE 1.12 (H) 12/07/2016 0741   CALCIUM 9.8 12/07/2016 0741   PROT 7.1 12/07/2016 0741   ALBUMIN 4.2 12/07/2016 0741   AST 21 12/07/2016 0741   ALT 14 12/07/2016 0741   ALKPHOS 76 12/07/2016 0741   BILITOT 0.5 12/07/2016 0741   GFRNONAA 49 (L) 12/07/2016 0741   GFRAA 57 (L) 12/07/2016 0741    Assessment: 1. Diverticulitis: Patient with textbook left lower quadrant abdominal pain which started a week ago Friday, now finishing antibiotics after week of Cipro and Flagyl and symptoms have eased but not completely gone away, discussed this diagnosis in detail and answered patient's questions 2. Nausea: Likely related to antibiotics as this started after taking them  Plan: 1. Discussed the diagnosis of diverticulitis and what this means for the patient. Recommendations to try and prevent this in the future for a high-fiber diet, 25-35 g per day with the use of fiber supplement and/or through her diet as well as staying well-hydrated with a 6-8 8 ounce glasses of water per day. 2. Discussed that the patient should be on a low fiber/ low residue diet at this time and for at least another week until she has decrease of her left lower quadrant abdominal pain. We did discuss that she should titrate up in fiber in her diet after this point according to symptoms. 3. Prescribed hyoscyamine sulfate 0.125 mg every 4-6 hours as needed for abdominal cramping as a patient does have a history of IBS and increase in sensitivity already 4. Patient to finish off her antibiotics 5. Patient to call/return to clinic if she has an increased or worsening in pain or symptoms. If patient does call, would recommend a CT the abdomen and pelvis with contrast for further eval 6. Patient to follow in clinic with Dr. Hilarie Fredrickson as needed in the future  Ellouise Newer, PA-C [facility] 12/13/2016, 3:28  PM   Addendum: Reviewed and agree with management. Pyrtle, Lajuan Lines, MD   Cc: Binnie Rail, MD

## 2016-12-20 ENCOUNTER — Encounter: Payer: Self-pay | Admitting: Internal Medicine

## 2017-01-02 ENCOUNTER — Encounter: Payer: Self-pay | Admitting: Internal Medicine

## 2017-01-02 ENCOUNTER — Ambulatory Visit (INDEPENDENT_AMBULATORY_CARE_PROVIDER_SITE_OTHER): Payer: Medicare Other | Admitting: Internal Medicine

## 2017-01-02 VITALS — BP 128/70 | HR 72 | Temp 98.2°F | Resp 16 | Wt 168.0 lb

## 2017-01-02 DIAGNOSIS — B370 Candidal stomatitis: Secondary | ICD-10-CM | POA: Diagnosis not present

## 2017-01-02 MED ORDER — NYSTATIN 100000 UNIT/ML MT SUSP: 5.0000 mL | Freq: Four times a day (QID) | OROMUCOSAL | 0 refills | Status: DC

## 2017-01-02 NOTE — Patient Instructions (Signed)
Take the medication as prescribe.    Oral Thrush, Adult Oral thrush, also called oral candidiasis, is a fungal infection that develops in the mouth and throat and on the tongue. It causes white patches to form on the mouth and tongue. Ritta Slot is most common in older adults, but it can occur at any age. Many cases of thrush are mild, but this infection can also be serious. Ritta Slot can be a repeated (recurrent) problem for certain people who have a weak body defense system (immune system). The weakness can be caused by chronic illnesses, or by taking medicines that limit the body's ability to fight infection. If a person has difficulty fighting infection, the fungus that causes thrush can spread through the body. This can cause life-threatening blood or organ infections. What are the causes? This condition is caused by a fungus (yeast) called Candida albicans.  This fungus is normally present in small amounts in the mouth and on other mucous membranes. It usually causes no harm.  If conditions are present that allow the fungus to grow without control, it invades surrounding tissues and becomes an infection.  Other Candida species can also lead to thrush (rare).  What increases the risk? This condition is more likely to develop in:  People with a weakened immune system.  Older adults.  People with HIV (human immunodeficiency virus).  People with diabetes.  People with dry mouth (xerostomia).  Pregnant women.  People with poor dental care, especially people who have false teeth.  People who use antibiotic medicines.  What are the signs or symptoms? Symptoms of this condition can vary from mild and moderate to severe and persistent. Symptoms may include:  A burning feeling in the mouth and throat. This can occur at the start of a thrush infection.  White patches that stick to the mouth and tongue. The tissue around the patches may be red, raw, and painful. If rubbed (during tooth  brushing, for example), the patches and the tissue of the mouth may bleed easily.  A bad taste in the mouth or difficulty tasting foods.  A cottony feeling in the mouth.  Pain during eating and swallowing.  Poor appetite.  Cracking at the corners of the mouth.  How is this diagnosed? This condition is diagnosed based on:  Physical exam. Your health care provider will look in your mouth.  Health history. Your health care provider will ask you questions about your health.  How is this treated? This condition is treated with medicines called antifungals, which prevent the growth of fungi. These medicines are either applied directly to the affected area (topical) or swallowed (oral). The treatment will depend on the severity of the condition. Mild thrush Mild cases of thrush may clear up with the use of an antifungal mouth rinse or lozenges. Treatment usually lasts about 14 days. Moderate to severe thrush  More severe thrush infections that have spread to the esophagus are treated with an oral antifungal medicine. A topical antifungal medicine may also be used.  For some severe infections, treatment may need to continue for more than 14 days.  Oral antifungal medicines are rarely used during pregnancy because they may be harmful to the unborn child. If you are pregnant, talk with your health care provider about options for treatment. Persistent or recurrent thrush For cases of thrush that do not go away or keep coming back:  Treatment may be needed twice as long as the symptoms last.  Treatment will include both oral and topical  antifungal medicines.  People with a weakened immune system can take an antifungal medicine on a continuous basis to prevent thrush infections.  It is important to treat conditions that make a person more likely to get thrush, such as diabetes or HIV. Follow these instructions at home: Medicines  Take over-the-counter and prescription medicines only as  told by your health care provider.  Talk with your health care provider about an over-the-counter medicine called gentian violet, which kills bacteria and fungi. Relieving soreness and discomfort To help reduce the discomfort of thrush:  Drink cold liquids such as water or iced tea.  Try flavored ice treats or frozen juices.  Eat foods that are easy to swallow, such as gelatin, ice cream, or custard.  Try drinking from a straw if the patches in your mouth are painful.  General instructions  Eat plain, unflavored yogurt as directed by your health care provider. Check the label to make sure the yogurt contains live cultures. This yogurt can help healthy bacteria to grow in the mouth and can stop the growth of the fungus that causes thrush.  If you wear dentures, remove the dentures before going to bed, brush them vigorously, and soak them in a cleaning solution as directed by your health care provider.  Rinse your mouth with a warm salt-water mixture several times a day. To make a salt-water mixture, completely dissolve 1/2-1 tsp of salt in 1 cup of warm water. Contact a health care provider if:  Your symptoms are getting worse or are not improving within 7 days of starting treatment.  You have symptoms of a spreading infection, such as white patches on the skin outside of the mouth. This information is not intended to replace advice given to you by your health care provider. Make sure you discuss any questions you have with your health care provider. Document Released: 04/11/2004 Document Revised: 04/10/2016 Document Reviewed: 04/10/2016 Elsevier Interactive Patient Education  2017 [employer].

## 2017-01-02 NOTE — Progress Notes (Signed)
Subjective:    Patient ID: [name], female    DOB: 1945/11/30, 71 y.o.   MRN: [id number]  HPI She is here for an acute visit.   The beginning of may she had a flare of diverticulitis and was prescribed flagyl and cipro.    ? Thrush:  Her tongue looks orange at times.  She has some mild sore throat - like it is swollen or not going to be able to swallow.  Her tongue feels different.  Her tongue looks a little different.      Medications and allergies reviewed with patient and updated if appropriate.  Patient Active Problem List   Diagnosis Date Noted  . Atherosclerotic plaque 11/07/2016  . Chest pain 09/21/2016  . 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 Renal insufficiency 06/29/2016  . RUQ discomfort 06/29/2016  . Osteoporosis 06/21/2016  . Dizziness 05/16/2016  . Arthritis of left shoulder region 01/17/2016  . Ankylosing spondylitis ([place]) 11/08/2015  . Incontinence 10/28/2015  . Thoracic back pain 10/19/2015  . Muscle cramps 09/29/2015  . Prediabetes 09/29/2015  . 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 Impingement syndrome of left shoulder region 09/22/2014  . Interstitial cystitis 07/09/2014  . Diverticulosis of colon without hemorrhage 07/08/2014  . Carpal tunnel syndrome of left wrist 04/12/2014  . Food allergy 03/06/2014  . Renal calculi 01/30/2014  . OSA (obstructive sleep apnea), moderate 01/14/2014  . Visual changes 12/17/2013  . Seasonal and perennial allergic rhinitis 12/06/2013  . Seasonal allergic conjunctivitis 12/06/2013  . Pneumonia, primary atypical 05/15/2013  . History of Bell's palsy 02/26/2012  . Vitamin D deficiency 06/27/2010  . 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  . DRY MOUTH 06/27/2010  . ABDOMINAL PAIN, SUPRAPUBIC 05/25/2009  . GERD 04/30/2009  . DIVERTICULOSIS, COLON 04/30/2009  . History of colonic polyps 04/30/2009  . COSTOCHONDRITIS  01/15/2008  . HYPERLIPIDEMIA 08/21/2007  . Essential hypertension 08/21/2007  . ARTHRALGIA 08/21/2007  . Chronic lumbar radiculopathy 01/25/2007    Current Outpatient Prescriptions on File Prior to Visit  Medication Sig Dispense Refill  . acetaminophen (TYLENOL) 500 MG tablet Take by mouth.    . Ascorbic Acid (VITAMIN C) 1000 MG tablet Take by mouth.    Marland Kitchen aspirin 81 MG tablet Take 81 mg by mouth daily.      . blood glucose meter kit and supplies KIT Dispense based on patient and insurance preference. Use up to four times daily as directed. prediabetes 1 each 0  . Calcium Carbonate-Vit D-Min (CALCIUM 1200 PO) Take 1,200 mg by mouth 2 (two) times daily.    . cetirizine (ZYRTEC) 10 MG tablet Take 10 mg by mouth daily.      . Cholecalciferol (VITAMIN D3) 5000 units TABS Take 5,000 mg by mouth daily.    . Diclofenac Sodium 2 % SOLN Place onto the skin as directed for pain as needed.    . furosemide (LASIX) 20 MG tablet Take 1 tablet (20 mg total) by mouth daily. 90 tablet 3  . hyoscyamine (LEVSIN SL) 0.125 MG SL tablet Place 1 tablet (0.125 mg total) under the tongue every 4 (four) hours as needed. 15 tablet 1  . meloxicam (MOBIC) 15 MG tablet Take 1 tablet (15 mg total) by mouth daily as needed for pain. With food 90 tablet 2  . metoprolol succinate (TOPROL XL) 25 MG 24 hr tablet Take 1 tablet (25 mg total) by mouth daily. 90 tablet  3  . Misc Natural Products (COSAMIN ASU ADVANCED FORMULA PO) Take 2 each by mouth daily.    . Omega-3 Fatty Acids (FISH OIL BURP-LESS) 1000 MG CAPS Takes 1000 mg BID 60 capsule 0  . ondansetron (ZOFRAN ODT) 4 MG disintegrating tablet Take 1 tablet (4 mg total) by mouth every 8 (eight) hours as needed for nausea or vomiting. 20 tablet 0  . Polyethyl Glycol-Propyl Glycol (SYSTANE OP) Apply 1 drop to eye as needed. Apply to both eyes as needed    . ranitidine (ZANTAC) 150 MG tablet Take 1 tablet (150 mg total) by mouth 2 (two) times daily. 60 tablet 5  . rosuvastatin  (CRESTOR) 5 MG tablet Take 1 tablet (5 mg total) by mouth daily. 30 tablet 1  . Teriparatide, Recombinant, 600 MCG/2.4ML SOLN Inject into the skin.    Marland Kitchen vitamin B-12 (CYANOCOBALAMIN) 1000 MCG tablet Take by mouth.    . vitamin E 400 UNIT capsule Take 400 Units by mouth daily.     No current facility-administered medications on file prior to visit.     Past Medical History:  Diagnosis Date  . Allergy    seasonal  . Ankylosing spondylitis ([place])   . Anxiety   . Arthritis   . Bell palsy   . CTS (carpal tunnel syndrome)   . Cystitis   . Diverticulitis   . Diverticulosis of colon   . Dry eye syndrome   . Frozen shoulder   . GERD (gastroesophageal reflux disease)   . HLA B27 (HLA B27 positive)   . Hx of colonic polyps   . Hyperlipidemia    NMR 2005  . Hypertension   . Hypothyroidism   . Incontinence   . Internal hemorrhoids   . Interstitial cystitis   . Kidney stone   . Menopause   . Osteoarthritis   . Osteopenia    [facility]  . S/P total hysterectomy and bilateral salpingo-oophorectomy 04/12/2014  . Sleep apnea   . Subjective visual disturbance of both eyes 12/30/2013  . Tubular adenoma of colon   . Vitamin D deficiency   . Xerostomia    and Xeroophthalmia    Past Surgical History:  Procedure Laterality Date  . ABDOMINAL HYSTERECTOMY    . BLADDER SURGERY     for incontinence  . CARPAL TUNNEL RELEASE Left 12/22/2014   Procedure: LEFT CARPAL TUNNEL RELEASE;  Surgeon: Daryll Brod, MD;  Location: [place];  Service: Orthopedics;  Laterality: Left;  . COLONOSCOPY W/ POLYPECTOMY     adenomatous poyp 04-2008  . colonscopy     tics 10-2002  . CYSTOSCOPY WITH RETROGRADE PYELOGRAM, URETEROSCOPY AND STENT PLACEMENT Left 09/01/2013   Procedure: CYSTOSCOPY WITH RETROGRADE PYELOGRAM, AND LEFT STENT PLACEMENT;  Surgeon: Molli Hazard, MD;  Location: [facility];  Service: Urology;  Laterality: Left;  . elevated LFTs     due to Elmiron  .  FINGER SURGERY Left 2004   2nd finger  . G 1 P 1    . interstitial cystitis     with clot in catheter  . SHOULDER ARTHROSCOPY Left   . TOTAL ABDOMINAL HYSTERECTOMY W/ BILATERAL SALPINGOOPHORECTOMY     dysfunctional menses    Social History   Social History  . Marital status: Married    Spouse name: N/A  . Number of children: 1  . Years of education: BS   Occupational History  . retired   . teacher     Kindergarten  Social History Main Topics  . Smoking status: Never Smoker  . Smokeless tobacco: Never Used  . Alcohol use Yes     Comment: occ  . Drug use: No  . Sexual activity: Not Currently   Other Topics Concern  . Not on file   Social History Narrative   Married          Family History  Problem Relation Age of Onset  . Coronary artery disease Mother   . Hypertension Mother   . Asthma Mother   . Osteoporosis Mother   . Dementia Mother   . Stroke Mother        mini cva  . Diabetes Father   . Cirrhosis Father        non alcoholic  . Cancer Sister        colon  . Colon cancer Sister 67  . Cancer Paternal Aunt        2 aunts had renal cancer  . Coronary artery disease Maternal Grandfather   . Cancer Maternal Aunt        breast  . Diabetes Paternal Aunt   . Diabetes Paternal Grandmother     Review of Systems  Constitutional: Negative for chills and fever.  HENT: Positive for sore throat. Negative for mouth sores and trouble swallowing (swallowig feels different, tighter).        White coating on tongue       Objective:   Vitals:   01/02/17 1051  BP: 128/70  Pulse: 72  Resp: 16  Temp: 98.2 F (36.8 C)   Filed Weights   01/02/17 1051  Weight: 168 lb (76.2 kg)   Body mass index is 29.76 kg/m.  Wt Readings from Last 3 Encounters:  01/02/17 168 lb (76.2 kg)  12/13/16 168 lb 8 oz (76.4 kg)  12/09/16 167 lb 1.3 oz (75.8 kg)     Physical Exam  Constitutional: She appears well-developed and well-nourished. No distress.  HENT:  Head:  Normocephalic and atraumatic.  Neck: Neck supple. No tracheal deviation present. No thyromegaly present.  Tongue with coating  Lymphadenopathy:    She has no cervical adenopathy.  Skin: She is not diaphoretic.        Assessment & Plan:   See Problem List for Assessment and Plan of chronic medical problems.

## 2017-01-03 MED FILL — NYSTATIN 100,000 UNITS/ML S: 100000 | 10 days supply | Qty: 200 | Fill #0

## 2017-01-05 DIAGNOSIS — M810 Age-related osteoporosis without current pathological fracture: Secondary | ICD-10-CM | POA: Diagnosis not present

## 2017-01-13 NOTE — Progress Notes (Signed)
Subjective:    Patient ID: [name], female    DOB: 01-Mar-1946, 71 y.o.   MRN: [id number]  HPI The patient is here for follow up of her thrush.  She has been using nystatin swish and spit for 10 days.  She still has a metallic taste in her mouth.  Foods do not taste normal.  Even bottled water has a metallic taste.  Her tongue looks better, but her other symptoms are not better.  She does not taste sweet things, salty things, but does taste sour foods.  She has not lost weight.   She also has several symptoms and she is not sure if they are related to the taste or taking forteo.  She did stop forteo and she feel better.  She is not having increased bone pain she was having when on forteo.   Other symptoms she has are fatigue, sweats, chills, joint pain, headaches and feeling foggy.  She also has a few other symptoms and is unsure if it is related.   She feels there is a "film" like feeling at the base of her tongue.  She denies dysphagia.   Medications and allergies reviewed with patient and updated if appropriate.  Patient Active Problem List   Diagnosis Date Noted  . Atherosclerotic plaque 11/07/2016  . Chest pain 09/21/2016  . 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 Renal insufficiency 06/29/2016  . RUQ discomfort 06/29/2016  . Osteoporosis 06/21/2016  . Dizziness 05/16/2016  . Arthritis of left shoulder region 01/17/2016  . Ankylosing spondylitis ([place]) 11/08/2015  . Incontinence 10/28/2015  . Thoracic back pain 10/19/2015  . Muscle cramps 09/29/2015  . Prediabetes 09/29/2015  . 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 Impingement syndrome of left shoulder region 09/22/2014  . Interstitial cystitis 07/09/2014  . Diverticulosis of colon without hemorrhage 07/08/2014  . Carpal tunnel syndrome of left wrist 04/12/2014  . Food allergy 03/06/2014  . Renal calculi 01/30/2014  .  OSA (obstructive sleep apnea), moderate 01/14/2014  . Visual changes 12/17/2013  . Seasonal and perennial allergic rhinitis 12/06/2013  . Seasonal allergic conjunctivitis 12/06/2013  . Pneumonia, primary atypical 05/15/2013  . History of Bell's palsy 02/26/2012  . Vitamin D deficiency 06/27/2010  . 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  . DRY MOUTH 06/27/2010  . ABDOMINAL PAIN, SUPRAPUBIC 05/25/2009  . GERD 04/30/2009  . DIVERTICULOSIS, COLON 04/30/2009  . History of colonic polyps 04/30/2009  . COSTOCHONDRITIS 01/15/2008  . HYPERLIPIDEMIA 08/21/2007  . Essential hypertension 08/21/2007  . ARTHRALGIA 08/21/2007  . Chronic lumbar radiculopathy 01/25/2007    Current Outpatient Prescriptions on File Prior to Visit  Medication Sig Dispense Refill  . acetaminophen (TYLENOL) 500 MG tablet Take by mouth.    . Ascorbic Acid (VITAMIN C) 1000 MG tablet Take by mouth.    Marland Kitchen aspirin 81 MG tablet Take 81 mg by mouth daily.      . blood glucose meter kit and supplies KIT Dispense based on patient and insurance preference. Use up to four times daily as directed. prediabetes 1 each 0  . Calcium Carbonate-Vit D-Min (CALCIUM 1200 PO) Take 1,200 mg by mouth 2 (two) times daily.    . cetirizine (ZYRTEC) 10 MG tablet Take 10 mg by mouth daily.      . Cholecalciferol (VITAMIN D3) 5000 units TABS Take 5,000 mg by mouth daily.    . Diclofenac Sodium 2 % SOLN Place onto the  skin as directed for pain as needed.    . furosemide (LASIX) 20 MG tablet Take 1 tablet (20 mg total) by mouth daily. 90 tablet 3  . hyoscyamine (LEVSIN SL) 0.125 MG SL tablet Place 1 tablet (0.125 mg total) under the tongue every 4 (four) hours as needed. 15 tablet 1  . meloxicam (MOBIC) 15 MG tablet Take 1 tablet (15 mg total) by mouth daily as needed for pain. With food 90 tablet 2  . metoprolol succinate (TOPROL XL) 25 MG 24 hr tablet Take 1 tablet (25 mg total) by mouth daily. 90 tablet 3  . Misc Natural Products (COSAMIN ASU ADVANCED FORMULA  PO) Take 2 each by mouth daily.    Marland Kitchen nystatin (MYCOSTATIN) 100000 UNIT/ML suspension Take 5 mLs (500,000 Units total) by mouth 4 (four) times daily. Use for 10 days 200 mL 0  . Omega-3 Fatty Acids (FISH OIL BURP-LESS) 1000 MG CAPS Takes 1000 mg BID 60 capsule 0  . ondansetron (ZOFRAN ODT) 4 MG disintegrating tablet Take 1 tablet (4 mg total) by mouth every 8 (eight) hours as needed for nausea or vomiting. 20 tablet 0  . Polyethyl Glycol-Propyl Glycol (SYSTANE OP) Apply 1 drop to eye as needed. Apply to both eyes as needed    . ranitidine (ZANTAC) 150 MG tablet Take 1 tablet (150 mg total) by mouth 2 (two) times daily. 60 tablet 5  . rosuvastatin (CRESTOR) 5 MG tablet Take 1 tablet (5 mg total) by mouth daily. 30 tablet 1  . Teriparatide, Recombinant, 600 MCG/2.4ML SOLN Inject into the skin.    Marland Kitchen vitamin B-12 (CYANOCOBALAMIN) 1000 MCG tablet Take by mouth.    . vitamin E 400 UNIT capsule Take 400 Units by mouth daily.     No current facility-administered medications on file prior to visit.     Past Medical History:  Diagnosis Date  . Allergy    seasonal  . Ankylosing spondylitis ([place])   . Anxiety   . Arthritis   . Bell palsy   . CTS (carpal tunnel syndrome)   . Cystitis   . Diverticulitis   . Diverticulosis of colon   . Dry eye syndrome   . Frozen shoulder   . GERD (gastroesophageal reflux disease)   . HLA B27 (HLA B27 positive)   . Hx of colonic polyps   . Hyperlipidemia    NMR 2005  . Hypertension   . Hypothyroidism   . Incontinence   . Internal hemorrhoids   . Interstitial cystitis   . Kidney stone   . Menopause   . Osteoarthritis   . Osteopenia    BMD done [facility] , [facility]  . S/P total hysterectomy and bilateral salpingo-oophorectomy 04/12/2014  . Sleep apnea   . Subjective visual disturbance of both eyes 12/30/2013  . Tubular adenoma of colon   . Vitamin D deficiency   . Xerostomia    and Xeroophthalmia    Past Surgical History:  Procedure Laterality  Date  . ABDOMINAL HYSTERECTOMY    . BLADDER SURGERY     for incontinence  . CARPAL TUNNEL RELEASE Left 12/22/2014   Procedure: LEFT CARPAL TUNNEL RELEASE;  Surgeon: Daryll Brod, MD;  Location: [place];  Service: Orthopedics;  Laterality: Left;  . COLONOSCOPY W/ POLYPECTOMY     adenomatous poyp 04-2008  . colonscopy     tics 10-2002  . CYSTOSCOPY WITH RETROGRADE PYELOGRAM, URETEROSCOPY AND STENT PLACEMENT Left 09/01/2013   Procedure: CYSTOSCOPY WITH RETROGRADE PYELOGRAM, AND  LEFT STENT PLACEMENT;  Surgeon: Molli Hazard, MD;  Location: [facility];  Service: Urology;  Laterality: Left;  . elevated LFTs     due to Elmiron  . FINGER SURGERY Left 2004   2nd finger  . G 1 P 1    . interstitial cystitis     with clot in catheter  . SHOULDER ARTHROSCOPY Left   . TOTAL ABDOMINAL HYSTERECTOMY W/ BILATERAL SALPINGOOPHORECTOMY     dysfunctional menses    Social History   Social History  . Marital status: Married    Spouse name: N/A  . Number of children: 1  . Years of education: BS   Occupational History  . retired   . teacher     Kindergarten   Social History Main Topics  . Smoking status: Never Smoker  . Smokeless tobacco: Never Used  . Alcohol use Yes     Comment: occ  . Drug use: No  . Sexual activity: Not Currently   Other Topics Concern  . Not on file   Social History Narrative   Married          Family History  Problem Relation Age of Onset  . Coronary artery disease Mother   . Hypertension Mother   . Asthma Mother   . Osteoporosis Mother   . Dementia Mother   . Stroke Mother        mini cva  . Diabetes Father   . Cirrhosis Father        non alcoholic  . Cancer Sister        colon  . Colon cancer Sister 51  . Cancer Paternal Aunt        2 aunts had renal cancer  . Coronary artery disease Maternal Grandfather   . Cancer Maternal Aunt        breast  . Diabetes Paternal Aunt   . Diabetes Paternal Grandmother     Review of Systems    Constitutional: Positive for chills (occasionally), diaphoresis (occasionally) and fatigue. Negative for fever.  HENT: Positive for postnasal drip and sore throat (minimal amount in upper esophagus). Negative for congestion, sinus pain, sinus pressure and trouble swallowing.        Feeling of something on base of tongue  Gastrointestinal:       No gerd  Musculoskeletal: Positive for arthralgias (worse with forteo).       Leg cramps  Skin: Negative for rash.  Neurological: Positive for headaches (occasional).  Psychiatric/Behavioral:       More foggy       Objective:   Vitals:   01/15/17 1328  BP: (!) 148/84  Pulse: 63  Resp: 16  Temp: 98 F (36.7 C)   Wt Readings from Last 3 Encounters:  01/15/17 172 lb (78 kg)  01/02/17 168 lb (76.2 kg)  12/13/16 168 lb 8 oz (76.4 kg)   Body mass index is 30.47 kg/m.   Physical Exam    Constitutional: Appears well-developed and well-nourished. No distress.  HENT:  Head: Normocephalic and atraumatic.  Neck: Neck supple. No tracheal deviation present. No thyromegaly present.  No cervical lymphadenopathy. tongue normal appearing without coating. No oral lesions.  Cardiovascular: Normal rate, regular rhythm and normal heart sounds.   No murmur heard. No carotid bruit .  No edema Pulmonary/Chest: Effort normal and breath sounds normal. No respiratory distress. No has no wheezes. No rales.  Skin: Skin is warm and dry. Not diaphoretic.  No rash.  Psychiatric: Normal mood and  affect. Behavior is normal.      Assessment & Plan:    See Problem List for Assessment and Plan of chronic medical problems.

## 2017-01-15 ENCOUNTER — Encounter: Payer: Self-pay | Admitting: Internal Medicine

## 2017-01-15 ENCOUNTER — Ambulatory Visit (INDEPENDENT_AMBULATORY_CARE_PROVIDER_SITE_OTHER): Payer: Medicare Other | Admitting: Internal Medicine

## 2017-01-15 ENCOUNTER — Other Ambulatory Visit (INDEPENDENT_AMBULATORY_CARE_PROVIDER_SITE_OTHER): Payer: Medicare Other

## 2017-01-15 VITALS — BP 148/84 | HR 63 | Temp 98.0°F | Resp 16 | Wt 172.0 lb

## 2017-01-15 DIAGNOSIS — R438 Other disturbances of smell and taste: Secondary | ICD-10-CM | POA: Diagnosis not present

## 2017-01-15 DIAGNOSIS — R5383 Other fatigue: Secondary | ICD-10-CM

## 2017-01-15 DIAGNOSIS — I10 Essential (primary) hypertension: Secondary | ICD-10-CM | POA: Diagnosis not present

## 2017-01-15 DIAGNOSIS — R432 Parageusia: Secondary | ICD-10-CM | POA: Diagnosis not present

## 2017-01-15 DIAGNOSIS — R252 Cramp and spasm: Secondary | ICD-10-CM

## 2017-01-15 LAB — CBC WITH DIFFERENTIAL/PLATELET
BASOS ABS: 0.1 10*3/uL (ref 0.0–0.1)
Basophils Relative: 0.8 % (ref 0.0–3.0)
EOS ABS: 0.2 10*3/uL (ref 0.0–0.7)
Eosinophils Relative: 2.7 % (ref 0.0–5.0)
HEMATOCRIT: 37.2 % (ref 36.0–46.0)
HEMOGLOBIN: 12.4 g/dL (ref 12.0–15.0)
LYMPHS PCT: 28.3 % (ref 12.0–46.0)
Lymphs Abs: 2 10*3/uL (ref 0.7–4.0)
MCHC: 33.5 g/dL (ref 30.0–36.0)
MCV: 88.5 fl (ref 78.0–100.0)
MONO ABS: 0.6 10*3/uL (ref 0.1–1.0)
Monocytes Relative: 8.8 % (ref 3.0–12.0)
Neutro Abs: 4.2 10*3/uL (ref 1.4–7.7)
Neutrophils Relative %: 59.4 % (ref 43.0–77.0)
PLATELETS: 269 10*3/uL (ref 150.0–400.0)
RBC: 4.2 Mil/uL (ref 3.87–5.11)
RDW: 13.6 % (ref 11.5–15.5)
WBC: 7.1 10*3/uL (ref 4.0–10.5)

## 2017-01-15 LAB — COMPREHENSIVE METABOLIC PANEL
ALT: 26 U/L (ref 0–35)
AST: 24 U/L (ref 0–37)
Albumin: 4.3 g/dL (ref 3.5–5.2)
Alkaline Phosphatase: 72 U/L (ref 39–117)
BILIRUBIN TOTAL: 0.3 mg/dL (ref 0.2–1.2)
BUN: 16 mg/dL (ref 6–23)
CALCIUM: 10.4 mg/dL (ref 8.4–10.5)
CHLORIDE: 102 meq/L (ref 96–112)
CO2: 30 meq/L (ref 19–32)
CREATININE: 1.04 mg/dL (ref 0.40–1.20)
GFR: 55.22 mL/min — ABNORMAL LOW (ref >60.00)
Glucose, Bld: 91 mg/dL (ref 70–99)
Potassium: 4.1 mEq/L (ref 3.5–5.1)
SODIUM: 138 meq/L (ref 135–145)
Total Protein: 7.3 g/dL (ref 6.0–8.3)

## 2017-01-15 LAB — TSH: TSH: 3.48 u[IU]/mL (ref 0.35–4.50)

## 2017-01-15 LAB — MAGNESIUM: MAGNESIUM: 2.1 mg/dL (ref 1.5–2.5)

## 2017-01-15 NOTE — Patient Instructions (Addendum)
  Test(s) ordered today. Your results will be released to [place] (or called to you) after review, usually within 72hours after test completion. If any changes need to be made, you will be notified at that same time.   Medications reviewed and updated.  No changes recommended at this time.   A referral was ordered for ENT.

## 2017-01-15 NOTE — Assessment & Plan Note (Addendum)
Going on for a few weeks - not sure when is started - she was on cipro/flagyl for diverticulitis, but she is unsure if it was related to that and has persisted No big improvement with nystatin ? sjogren's - check labs Unlikely vitamin def - taking B12 Will refer to ENT ? Likely not related to forteo

## 2017-01-15 NOTE — Assessment & Plan Note (Signed)
?   Cause Check blood work  Refer to ENT

## 2017-01-15 NOTE — Assessment & Plan Note (Signed)
Elevated here today, but better controlled at home Continue to monitor at home No change in medication

## 2017-01-15 NOTE — Assessment & Plan Note (Signed)
?   Related to decreased taste/metallic taste Check labs

## 2017-01-15 NOTE — Assessment & Plan Note (Signed)
Cmp, magnesium

## 2017-01-16 LAB — ANA: Anti Nuclear Antibody(ANA): NEGATIVE

## 2017-01-17 DIAGNOSIS — Z658 Other specified problems related to psychosocial circumstances: Secondary | ICD-10-CM | POA: Insufficient documentation

## 2017-01-17 DIAGNOSIS — K219 Gastro-esophageal reflux disease without esophagitis: Secondary | ICD-10-CM | POA: Diagnosis not present

## 2017-01-17 DIAGNOSIS — Z9181 History of falling: Secondary | ICD-10-CM | POA: Diagnosis not present

## 2017-01-17 DIAGNOSIS — Z8262 Family history of osteoporosis: Secondary | ICD-10-CM | POA: Diagnosis not present

## 2017-01-17 DIAGNOSIS — Z5181 Encounter for therapeutic drug level monitoring: Secondary | ICD-10-CM | POA: Diagnosis not present

## 2017-01-17 DIAGNOSIS — R5383 Other fatigue: Secondary | ICD-10-CM | POA: Insufficient documentation

## 2017-01-17 DIAGNOSIS — M810 Age-related osteoporosis without current pathological fracture: Secondary | ICD-10-CM | POA: Diagnosis not present

## 2017-01-17 MED FILL — METOPROLOL SUCC ER 25 MG TA: 25 | 90 days supply | Qty: 90 | Fill #1

## 2017-01-17 MED FILL — HYDROCHLOROTHIAZIDE 12.5 MG: 12.5 | 90 days supply | Qty: 90 | Fill #1

## 2017-01-18 ENCOUNTER — Encounter: Payer: Self-pay | Admitting: Internal Medicine

## 2017-01-18 ENCOUNTER — Other Ambulatory Visit: Payer: Self-pay | Admitting: Internal Medicine

## 2017-01-18 DIAGNOSIS — K117 Disturbances of salivary secretion: Secondary | ICD-10-CM

## 2017-01-19 ENCOUNTER — Other Ambulatory Visit: Payer: Medicare Other

## 2017-01-19 DIAGNOSIS — K117 Disturbances of salivary secretion: Secondary | ICD-10-CM

## 2017-01-22 LAB — SJOGRENS SYNDROME-B EXTRACTABLE NUCLEAR ANTIBODY: SSB (LA) (ENA) ANTIBODY, IGG: NEGATIVE

## 2017-01-22 LAB — SJOGRENS SYNDROME-A EXTRACTABLE NUCLEAR ANTIBODY: SSA (Ro) (ENA) Antibody, IgG: <1

## 2017-01-23 ENCOUNTER — Encounter: Payer: Self-pay | Admitting: Internal Medicine

## 2017-01-24 NOTE — Progress Notes (Signed)
Cardiology Office Note:    Date:  01/25/2017   ID:  [name], DOB 04/28/1946, MRN [id number]  PCP:  Binnie Rail, MD  Cardiologist:  Conni Elliot  Referring MD: Binnie Rail, MD   Chief Complaint  Patient presents with  . Follow-up    Medication questions    History of Present Illness:    [name] is a 71 y.o. female with a hx of Hypertension, hyperlipidemia, ankylosing spondylitis with HLA-B27 gene positivity, GERD, sleep apnea on CPAP.  The patient was seen in March of this year by Dr. Meda Coffee with complaints of dizziness that she related to being on Benicar as well as dyspnea on exertion and left-sided chest pain. A coronary CTA 11/05/16 showed a coronary calcium score of 65 which is at the 56 percentile for age and sex, and moderate CAD in the RCA/PDA and narrowing of the mid to distal LAD, evidence of mild nonobstructive CAD. Morphology was also normal. Rosuvastatin 5 mg daily was initiated and a follow-up lipid panel showed an LDL of 44, down from 105 and normal liver function. Benicar was discontinued and metoprolol used in place as she has tolerated that well in the past.   Today she is here with complaints of altered taste and metallic taste and she wanders if her heart meds can be attributing. She has altered taste in that things do not taste like they should like Coke does not taste like Coke and water tastes metallic (even bottled water). She has discussed this with her PCP and her osteoporosis practitioner who has stopped her Forteo briefly to see if it makes a difference which so far has not. She first began to notice this in April after a bout of diverticulitis for which she was given a course of Cipro and Flagyl. She has good oral hygiene and regular dental care. She gets dry mouth related to CPAP.   An echocardiogram on 10/24/16 showed normal LV systolic function, grade 1 diastolic dysfunction, mildly dilated aortic root and mild aortic insufficiency.  Past  Medical History:  Diagnosis Date  . Allergy    seasonal  . Ankylosing spondylitis ([place])   . Anxiety   . Arthritis   . Bell palsy   . CTS (carpal tunnel syndrome)   . Cystitis   . Diverticulitis   . Diverticulosis of colon   . Dry eye syndrome   . Frozen shoulder   . GERD (gastroesophageal reflux disease)   . HLA B27 (HLA B27 positive)   . Hx of colonic polyps   . Hyperlipidemia    NMR 2005  . Hypertension   . Hypothyroidism   . Incontinence   . Internal hemorrhoids   . Interstitial cystitis   . Kidney stone   . Menopause   . Osteoarthritis   . Osteopenia    [facility]  . S/P total hysterectomy and bilateral salpingo-oophorectomy 04/12/2014  . Sleep apnea   . Subjective visual disturbance of both eyes 12/30/2013  . Tubular adenoma of colon   . Vitamin D deficiency   . Xerostomia    and Xeroophthalmia    Past Surgical History:  Procedure Laterality Date  . ABDOMINAL HYSTERECTOMY    . BLADDER SURGERY     for incontinence  . CARPAL TUNNEL RELEASE Left 12/22/2014   Procedure: LEFT CARPAL TUNNEL RELEASE;  Surgeon: Daryll Brod, MD;  Location: [place];  Service: Orthopedics;  Laterality: Left;  . COLONOSCOPY W/  POLYPECTOMY     adenomatous poyp 04-2008  . colonscopy     tics 10-2002  . CYSTOSCOPY WITH RETROGRADE PYELOGRAM, URETEROSCOPY AND STENT PLACEMENT Left 09/01/2013   Procedure: CYSTOSCOPY WITH RETROGRADE PYELOGRAM, AND LEFT STENT PLACEMENT;  Surgeon: Molli Hazard, MD;  Location: [facility];  Service: Urology;  Laterality: Left;  . elevated LFTs     due to Elmiron  . FINGER SURGERY Left 2004   2nd finger  . G 1 P 1    . interstitial cystitis     with clot in catheter  . SHOULDER ARTHROSCOPY Left   . TOTAL ABDOMINAL HYSTERECTOMY W/ BILATERAL SALPINGOOPHORECTOMY     dysfunctional menses    Current Medications: Current Meds  Medication Sig  . acetaminophen (TYLENOL) 500 MG tablet Take 500 mg by mouth every 4 (four)  hours as needed for mild pain.   . Ascorbic Acid (VITAMIN C) 1000 MG tablet Take 1,000 mg by mouth daily.   Marland Kitchen aspirin 81 MG tablet Take 81 mg by mouth daily.    . blood glucose meter kit and supplies KIT Dispense based on patient and insurance preference. Use up to four times daily as directed. prediabetes  . Calcium Carbonate-Vit D-Min (CALCIUM 1200 PO) Take 1,200 mg by mouth 2 (two) times daily.  . cetirizine (ZYRTEC) 10 MG tablet Take 10 mg by mouth daily.    . Cholecalciferol (VITAMIN D3) 5000 units TABS Take 5,000 mg by mouth daily.  . Diclofenac Sodium 2 % SOLN Place onto the skin as directed for pain as needed.  . hyoscyamine (LEVSIN SL) 0.125 MG SL tablet Place 1 tablet (0.125 mg total) under the tongue every 4 (four) hours as needed.  . meloxicam (MOBIC) 15 MG tablet Take 1 tablet (15 mg total) by mouth daily as needed for pain. With food  . metoprolol succinate (TOPROL XL) 25 MG 24 hr tablet Take 1 tablet (25 mg total) by mouth daily.  . Misc Natural Products (COSAMIN ASU ADVANCED FORMULA PO) Take 2 each by mouth daily.  . Omega-3 Fatty Acids (FISH OIL BURP-LESS) 1000 MG CAPS Takes 1000 mg BID  . ondansetron (ZOFRAN ODT) 4 MG disintegrating tablet Take 1 tablet (4 mg total) by mouth every 8 (eight) hours as needed for nausea or vomiting.  Vladimir Faster Glycol-Propyl Glycol (SYSTANE OP) Apply 1 drop to eye as needed. Apply to both eyes as needed  . rosuvastatin (CRESTOR) 5 MG tablet Take 1 tablet (5 mg total) by mouth daily.  . vitamin B-12 (CYANOCOBALAMIN) 1000 MCG tablet Take 1,000 mcg by mouth daily.   . vitamin E 400 UNIT capsule Take 400 Units by mouth daily.  . [DISCONTINUED] furosemide (LASIX) 20 MG tablet Take 1 tablet (20 mg total) by mouth daily.  . [DISCONTINUED] hydrochlorothiazide (MICROZIDE) 12.5 MG capsule Take 12.5 mg by mouth daily.  . [DISCONTINUED] nystatin (MYCOSTATIN) 100000 UNIT/ML suspension Take 5 mLs (500,000 Units total) by mouth 4 (four) times daily. Use for 10  days  . [DISCONTINUED] ranitidine (ZANTAC) 150 MG tablet Take 1 tablet (150 mg total) by mouth 2 (two) times daily.  . [DISCONTINUED] Teriparatide, Recombinant, 600 MCG/2.4ML SOLN Inject into the skin.     Allergies:   Nitroglycerin; Pentosan polysulfate sodium; Bactrim [sulfamethoxazole-trimethoprim]; Promethazine hcl; Lisinopril; and Sulfamethoxazole-trimethoprim   Social History   Social History  . Marital status: Married    Spouse name: N/A  . Number of children: 1  . Years of education: BS   Occupational History  . retired   .  teacher     Kindergarten   Social History Main Topics  . Smoking status: Never Smoker  . Smokeless tobacco: Never Used  . Alcohol use Yes     Comment: occ  . Drug use: No  . Sexual activity: Not Currently   Other Topics Concern  . None   Social History Narrative   Married           Family History: The patient's family history includes Asthma in her mother; Cancer in her maternal aunt, paternal aunt, and sister; Cirrhosis in her father; Colon cancer (age of onset: 35) in her sister; Coronary artery disease in her maternal grandfather and mother; Dementia in her mother; Diabetes in her father, paternal aunt, and paternal grandmother; Hypertension in her mother; Osteoporosis in her mother; Stroke in her mother. ROS:   Please see the history of present illness.     All other systems reviewed and are negative.  EKGs/Labs/Other Studies Reviewed:    The following studies were reviewed today:  Echocardiogram 10/24/16 Study Conclusions  - Left ventricle: The cavity size was normal. Wall thickness was   increased in a pattern of mild LVH. Systolic function was normal.   The estimated ejection fraction was in the range of 60% to 65%.   Wall motion was normal; there were no regional wall motion   abnormalities. Doppler parameters are consistent with abnormal   left ventricular relaxation (grade 1 diastolic dysfunction). - Aortic valve: There was  mild regurgitation. - Aortic root: The aortic root was mildly dilated.  Impressions: - Normal LV systolic function; grade 1 diastolic dysfunction;   sclerotic aortic valve with mild AI; mildly dilated aortic root.  CT coronary angiography and calcium score 11/05/16  IMPRESSION: 1. Coronary calcium score of 56. This was 62 percentile for age and sex matched control. 2. Normal coronary origin with right dominance. 3. At least moderate CAD in RCA/PDA and narrowing of the mid to distal LAD. We will send the study for further evaluation with CT FFR.   EKG:  EKG is not ordered today.    Recent Labs: 01/15/2017: ALT 26; BUN 16; Creatinine, Ser 1.04; Hemoglobin 12.4; Magnesium 2.1; Platelets 269.0; Potassium 4.1; Sodium 138; TSH 3.48   Recent Lipid Panel    Component Value Date/Time   CHOL 113 12/07/2016 0741   TRIG 74 12/07/2016 0741   TRIG 181 (H) 07/23/2006 1101   HDL 54 12/07/2016 0741   CHOLHDL 2.1 12/07/2016 0741   CHOLHDL 4 10/31/2016 0903   VLDL 37.0 10/31/2016 0903   LDLCALC 44 12/07/2016 0741   LDLDIRECT 114.0 05/02/2016 0919    Physical Exam:    VS:  BP 118/74   Pulse (!) 58   Ht 5' 2"  (1.575 m)   Wt 181 lb 12.8 oz (82.5 kg)   SpO2 98%   BMI 33.25 kg/m     Wt Readings from Last 3 Encounters:  01/25/17 181 lb 12.8 oz (82.5 kg)  01/15/17 172 lb (78 kg)  01/02/17 168 lb (76.2 kg)     GEN:  Well nourished, well developed in no acute distress HEENT: Normal NECK: No JVD; No carotid bruits LYMPHATICS: No lymphadenopathy CARDIAC: RRR, no murmurs, rubs, gallops RESPIRATORY:  Clear to auscultation without rales, wheezing or rhonchi  ABDOMEN: Soft, non-tender, non-distended MUSCULOSKELETAL:  No edema; No deformity  SKIN: Warm and dry NEUROLOGIC:  Alert and oriented x 3 PSYCHIATRIC:  Normal affect   ASSESSMENT:    1. Disordered taste   2. Essential (primary)  hypertension   3. Hyperlipidemia, unspecified hyperlipidemia type    PLAN:    In order of  problems listed above:  1. Altered taste -Medications reviewed with the in house pharmacist and no meds were identified that would be likely to cause altered taste. In a reviewed of medications on UpToDate Hydrochlorothiazide does have listed [place] listed as a possible side effect. She is presumably taking this for hypertension. She will stop the HCTZ for 3-4 weeks and if her symptoms resolve she will stay off. If no change, she can resume it.  -We discussed other possible causes of taste distortion including dental issues and neurologic issues. Also possibly dry mouth related to CPAP. She can use biotene mouth rinse as needed.   2. Hypertension -BP is well controlled. Pt will stop HCTZ as above. If not able to resume HCTZ, and BP becomes elevated, consider alternate antihypertensive.   3. Hyperlipidemia -Rosuvastatin 5 mg daily was initiated in April with a reduction of LDL from 105 to 44. The patient stopped the rosuvastatin in her quest to sove her taste problem. This did not help. She is advised to resumed and we discussed the benefits.  HLA-B27 gene positivity- echocardiogram in March 2018 mildly dilated aortic root with mild aortic insufficiency.  Medication Adjustments/Labs and Tests Ordered: Current medicines are reviewed at length with the patient today.  Concerns regarding medicines are outlined above. Labs and tests ordered and medication changes are outlined in the patient instructions below:  Patient Instructions  Medication Instructions:  Your physician recommends that you continue on your current medications as directed except to stop the hydrochlorothiazide to see if taste returns to normal. If taste does not improve after 3-4 weeks can resume the hydrochlorothiazide as this is not likely the culprit. Please refer to the Current Medication list given to you today.   Labwork: None ordered  Testing/Procedures: None ordered  Follow-Up: Your physician recommends that you  schedule a follow-up appointment in: [place]   Any Other Special Instructions Will Be Listed Below (If Applicable).    If you need a refill on your cardiac medications before your next appointment, please call your pharmacy.      Signed, Daune Perch, NP  01/25/2017 10:17 AM    [place]

## 2017-01-25 ENCOUNTER — Encounter: Payer: Self-pay | Admitting: Cardiology

## 2017-01-25 ENCOUNTER — Ambulatory Visit (INDEPENDENT_AMBULATORY_CARE_PROVIDER_SITE_OTHER): Payer: Medicare Other | Admitting: Cardiology

## 2017-01-25 VITALS — BP 118/74 | HR 58 | Ht 62.0 in | Wt 181.8 lb

## 2017-01-25 DIAGNOSIS — I10 Essential (primary) hypertension: Secondary | ICD-10-CM | POA: Diagnosis not present

## 2017-01-25 DIAGNOSIS — R439 Unspecified disturbances of smell and taste: Secondary | ICD-10-CM | POA: Diagnosis not present

## 2017-01-25 DIAGNOSIS — E785 Hyperlipidemia, unspecified: Secondary | ICD-10-CM | POA: Diagnosis not present

## 2017-01-25 NOTE — Patient Instructions (Addendum)
Medication Instructions:  Your physician recommends that you continue on your current medications as directed except to stop the hydrochlorothiazide to see if taste returns to normal. If taste does not improve after 3-4 weeks can resume the hydrochlorothiazide as this is not likely the culprit. Please refer to the Current Medication list given to you today.   Labwork: None ordered  Testing/Procedures: None ordered  Follow-Up: Your physician recommends that you schedule a follow-up appointment in: [place]   Any Other Special Instructions Will Be Listed Below (If Applicable).    If you need a refill on your cardiac medications before your next appointment, please call your pharmacy.

## 2017-01-26 ENCOUNTER — Encounter: Payer: Self-pay | Admitting: Family Medicine

## 2017-01-26 ENCOUNTER — Ambulatory Visit (INDEPENDENT_AMBULATORY_CARE_PROVIDER_SITE_OTHER): Payer: Medicare Other | Admitting: Family Medicine

## 2017-01-26 VITALS — BP 119/79 | HR 57 | Temp 98.2°F | Resp 20 | Wt 170.8 lb

## 2017-01-26 DIAGNOSIS — R438 Other disturbances of smell and taste: Secondary | ICD-10-CM

## 2017-01-26 DIAGNOSIS — K219 Gastro-esophageal reflux disease without esophagitis: Secondary | ICD-10-CM

## 2017-01-26 MED ORDER — OMEPRAZOLE 40 MG PO CPDR: 40.0000 mg | DELAYED_RELEASE_CAPSULE | Freq: Every day | ORAL | 0 refills | Status: DC

## 2017-01-26 MED FILL — OMEPRAZOLE DR 40 MG CAPSULE: 40 | 30 days supply | Qty: 30 | Fill #0

## 2017-01-26 NOTE — Progress Notes (Signed)
[name] , 03-10-1946, 71 y.o., female MRN: [id number] Patient Care Team    Relationship Specialty Notifications Start End  Binnie Rail, MD PCP - General Internal Medicine  11/09/15     Chief Complaint  Patient presents with  . metallic taste in mouth    has seen her PCP ,cardiology, and her Osteoporosis Dr     Subjective: Pt presents for an OV with complaints of metallic taste in her mouth of 2-3 months duration.  Associated symptoms include mild hoarseness. She has seen her cardiologist, PCP and Rheumatologist concerning problem. She is scheduled with an ENT at the end of July. Her doctors did not feel forteo would be contributing and that is the only even somewhat new medication. She endorses feeling like there is something stuck behind her tongue at times. She is on zantac. She use to be on omeprazole for esophagitis by EGD 2009. She reports she has not been on that for a long time. She denies heart burn. 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  Cardiology also stopped Microzide temporarily.  Mobic is on her med list but she has not taken in a very long time.  She was tested for Sjogren, which was negative. She does endorse dry mouth secondary to CPAP use.   Depression screen [facility] 2/9 06/02/2015 04/08/2014  Decreased Interest 0 0  Down, Depressed, Hopeless 1 0  PHQ - 2 Score 1 0  Some recent data might be hidden    Allergies  Allergen Reactions  . Nitroglycerin Anaphylaxis    Hypotensive after NTG administration in ER during chest pain evaluation  . Pentosan Polysulfate Sodium     Elevated LFTs.......Marland Kitchen elmiron   . Bactrim [Sulfamethoxazole-Trimethoprim]   . Promethazine Hcl     "jittery on the inside"  . Lisinopril Cough  . Sulfamethoxazole-Trimethoprim Nausea Only    Unknown reaction per pt   Social History  Substance Use Topics  . Smoking status: Never Smoker  . Smokeless tobacco: Never Used  . Alcohol use Yes     Comment: occ   Past  Medical History:  Diagnosis Date  . Allergy    seasonal  . Ankylosing spondylitis ([place])   . Anxiety   . Arthritis   . Bell palsy   . CTS (carpal tunnel syndrome)   . Cystitis   . Diverticulitis   . Diverticulosis of colon   . Dry eye syndrome   . Frozen shoulder   . GERD (gastroesophageal reflux disease)   . HLA B27 (HLA B27 positive)   . Hx of colonic polyps   . Hyperlipidemia    NMR 2005  . Hypertension   . Hypothyroidism   . Incontinence   . Internal hemorrhoids   . Interstitial cystitis   . Kidney stone   . Menopause   . Osteoarthritis   . Osteopenia    BMD done [facility]  . S/P total hysterectomy and bilateral salpingo-oophorectomy 04/12/2014  . Sleep apnea   . Subjective visual disturbance of both eyes 12/30/2013  . Tubular adenoma of colon   . Vitamin D deficiency   . Xerostomia    and Xeroophthalmia   Past Surgical History:  Procedure Laterality Date  . ABDOMINAL HYSTERECTOMY    . BLADDER SURGERY     for incontinence  . CARPAL TUNNEL RELEASE Left 12/22/2014   Procedure: LEFT CARPAL TUNNEL RELEASE;  Surgeon: Daryll Brod, MD;  Location: [place];  Service: Orthopedics;  Laterality: Left;  . COLONOSCOPY W/ POLYPECTOMY     adenomatous poyp 04-2008  . colonscopy     tics 10-2002  . CYSTOSCOPY WITH RETROGRADE PYELOGRAM, URETEROSCOPY AND STENT PLACEMENT Left 09/01/2013   Procedure: CYSTOSCOPY WITH RETROGRADE PYELOGRAM, AND LEFT STENT PLACEMENT;  Surgeon: Molli Hazard, MD;  [facility];  Service: Urology;  Laterality: Left;  . elevated LFTs     due to Elmiron  . FINGER SURGERY Left 2004   2nd finger  . G 1 P 1    . interstitial cystitis     with clot in catheter  . SHOULDER ARTHROSCOPY Left   . TOTAL ABDOMINAL HYSTERECTOMY W/ BILATERAL SALPINGOOPHORECTOMY     dysfunctional menses   Family History  Problem Relation Age of Onset  . Coronary artery disease Mother   . Hypertension Mother   . Asthma Mother   .  Osteoporosis Mother   . Dementia Mother   . Stroke Mother        mini cva  . Diabetes Father   . Cirrhosis Father        non alcoholic  . Cancer Sister        colon  . Colon cancer Sister 40  . Cancer Paternal Aunt        2 aunts had renal cancer  . Coronary artery disease Maternal Grandfather   . Cancer Maternal Aunt        breast  . Diabetes Paternal Aunt   . Diabetes Paternal Grandmother    Allergies as of 01/26/2017      Reactions   Nitroglycerin Anaphylaxis   Hypotensive after NTG administration in ER during chest pain evaluation   Pentosan Polysulfate Sodium    Elevated LFTs.......Marland Kitchen elmiron    Bactrim [sulfamethoxazole-trimethoprim]    Promethazine Hcl    "jittery on the inside"   Lisinopril Cough   Sulfamethoxazole-trimethoprim Nausea Only   Unknown reaction per pt      Medication List       Accurate as of 01/26/17  2:51 PM. Always use your most recent med list.          acetaminophen 500 MG tablet Commonly known as:  TYLENOL Take 500 mg by mouth every 4 (four) hours as needed for mild pain.   aspirin 81 MG tablet Take 81 mg by mouth daily.   blood glucose meter kit and supplies Kit Dispense based on patient and insurance preference. Use up to four times daily as directed. prediabetes   CALCIUM 1200 PO Take 1,200 mg by mouth 2 (two) times daily.   cetirizine 10 MG tablet Commonly known as:  ZYRTEC Take 10 mg by mouth daily.   COSAMIN ASU ADVANCED FORMULA PO Take 2 each by mouth daily.   Diclofenac Sodium 2 % Soln Place onto the skin as directed for pain as needed.   FISH OIL BURP-LESS 1000 MG Caps Takes 1000 mg BID   hyoscyamine 0.125 MG SL tablet Commonly known as:  LEVSIN SL Place 1 tablet (0.125 mg total) under the tongue every 4 (four) hours as needed.   meloxicam 15 MG tablet Commonly known as:  MOBIC Take 1 tablet (15 mg total) by mouth daily as needed for pain. With food   metoprolol succinate 25 MG 24 hr tablet Commonly known as:   TOPROL XL Take 1 tablet (25 mg total) by mouth daily.   ondansetron 4 MG disintegrating tablet Commonly known as:  ZOFRAN ODT Take 1 tablet (4 mg  total) by mouth every 8 (eight) hours as needed for nausea or vomiting.   rosuvastatin 5 MG tablet Commonly known as:  CRESTOR Take 1 tablet (5 mg total) by mouth daily.   SYSTANE OP Apply 1 drop to eye as needed. Apply to both eyes as needed   vitamin B-12 1000 MCG tablet Commonly known as:  CYANOCOBALAMIN Take 1,000 mcg by mouth daily.   vitamin C 1000 MG tablet Take 1,000 mg by mouth daily.   Vitamin D3 5000 units Tabs Take 5,000 mg by mouth daily.   vitamin E 400 UNIT capsule Take 400 Units by mouth daily.       All past medical history, surgical history, allergies, family history, immunizations andmedications were updated in the EMR today and reviewed under the history and medication portions of their EMR.     ROS: Negative, with the exception of above mentioned in HPI   Objective:  BP 119/79 (BP Location: Right Arm, Patient Position: Sitting, Cuff Size: Normal)   Pulse (!) 57   Temp 98.2 F (36.8 C)   Resp 20   Wt 170 lb 12 oz (77.5 kg)   SpO2 97%   BMI 31.23 kg/m  Body mass index is 31.23 kg/m. Gen: Afebrile. No acute distress. Nontoxic in appearance, well developed, well nourished.  HENT: AT. [place]. Bilateral TM visualized mild allergy signs, no erythema or bulging.  MMM, no oral lesions. Bilateral nares without erythema or swelling. Throat without erythema or exudates. No cough, hoarseness present.  Eyes:Pupils Equal Round Reactive to light, Extraocular movements intact,  Conjunctiva without redness, discharge or icterus. CV: RRR  Chest: CTAB, no wheeze or crackles. Good air movement, normal resp effort.  Neuro: Normal gait. PERLA. EOMi. Alert. Oriented x3  No exam data present No results found. No results found for this or any previous visit (from the past 24 hour(s)).  Assessment/Plan: HANH KERTESZ is a  71 y.o. female present for OV for  Gastroesophageal reflux disease without esophagitis Metallic taste - discussed multiple different possibilities.  - keep ENT appt at the end of the month.  - start prilosec 40 mg QD (2009 esophagitis), metallic taste and hoarseness.  - start daily biotin rinse.  - GERD diet and behavior modifications.  - omeprazole (PRILOSEC) 40 MG capsule; Take 1 capsule (40 mg total) by mouth daily.  Dispense: 30 capsule; Refill: 0 - F/U 1 month     Reviewed expectations re: course of current medical issues.  Discussed self-management of symptoms.  Outlined signs and symptoms indicating need for more acute intervention.  Patient verbalized understanding and all questions were answered.  Patient received an After-Visit Summary.     Note is dictated utilizing voice recognition software. Although note has been proof read prior to signing, occasional typographical errors still can be missed. If any questions arise, please do not hesitate to call for verification.   electronically signed by:  Howard Pouch, DO  [place]

## 2017-01-26 NOTE — Patient Instructions (Signed)
Start omprezole (prilosec) every day for 1 month. Follow up then to see if improving.  Watch for food triggers. Do not lay flat after eating (3-4 hours).  Use Biotin. Start zyrtec nightly.    Food Choices for Gastroesophageal Reflux Disease, Adult When you have gastroesophageal reflux disease (GERD), the foods you eat and your eating habits are very important. Choosing the right foods can help ease your discomfort. What guidelines do I need to follow?  Choose fruits, vegetables, whole grains, and low-fat dairy products.  Choose low-fat meat, fish, and poultry.  Limit fats such as oils, salad dressings, butter, nuts, and avocado.  Keep a food diary. This helps you identify foods that cause symptoms.  Avoid foods that cause symptoms. These may be different for everyone.  Eat small meals often instead of 3 large meals a day.  Eat your meals slowly, in a place where you are relaxed.  Limit fried foods.  Cook foods using methods other than frying.  Avoid drinking alcohol.  Avoid drinking large amounts of liquids with your meals.  Avoid bending over or lying down until 2-3 hours after eating. What foods are not recommended? These are some foods and drinks that may make your symptoms worse: Vegetables Tomatoes. Tomato juice. Tomato and spaghetti sauce. Chili peppers. Onion and garlic. Horseradish. Fruits Oranges, grapefruit, and lemon (fruit and juice). Meats High-fat meats, fish, and poultry. This includes hot dogs, ribs, ham, sausage, salami, and bacon. Dairy Whole milk and chocolate milk. Sour cream. Cream. Butter. Ice cream. Cream cheese. Drinks Coffee and tea. Bubbly (carbonated) drinks or energy drinks. Condiments Hot sauce. Barbecue sauce. Sweets/Desserts Chocolate and cocoa. Donuts. Peppermint and spearmint. Fats and Oils High-fat foods. This includes [place] fries and potato chips. Other Vinegar. Strong spices. This includes black pepper, white pepper, red pepper,  cayenne, curry powder, cloves, ginger, and chili powder. The items listed above may not be a complete list of foods and drinks to avoid. Contact your dietitian for more information. This information is not intended to replace advice given to you by your health care provider. Make sure you discuss any questions you have with your health care provider. Document Released: 01/16/2012 Document Revised: 12/23/2015 Document Reviewed: 05/21/2013 Elsevier Interactive Patient Education  2017 [employer].

## 2017-02-07 ENCOUNTER — Ambulatory Visit: Payer: Self-pay | Admitting: Nurse Practitioner

## 2017-02-11 NOTE — Progress Notes (Signed)
Subjective:    Patient ID: [name], female    DOB: [birth year], 71 y.o.   MRN: [id number]  HPI She is here for an acute visit.   Not able to taste:  She was referred to ENT, and has an appointment tomorrow.  Her symptoms started in March, she thinks.  She saw me in April and did not complain much of this.  She was on Cipro/Flagyl for diverticulitis in the beginning of May.  She started Forteo in May.  She was here complaining of her change in taste 01/02/17.  I treated her for possible thrush with nystatin and there was no change in her symptoms.  Water tastes metallic. She has a metallic taste in her mouth.  Food does not taste normal.  She can tell a candy is sour but not cherry flavor.  She tastes some salt.  A tomato does not taste like a tomato.  She still has a feeling of something in her throat.   She has a rash on her neck.   It started about 6 days ago.  It started on the left side of her neck and was itching.  It spread to her whole neck.  She has not been outside.  She denies wearing a necklace.  She denies new products.  She has not put anything on the rash.  It has gotten better - less red.   She is getting a break out of red bumps on her face.  Occasionally they come to a head.  She also sometimes gets white bumps under her skin on her face.  She denies a history acne. She is unsure if this is related to her other symptoms.   Fatigue: she continues to have fatigue.  She can fall asleep at any time and some time just needs to lay down.    She still has fatigue.  She sometimes has to just sit down, lay down or go to sleep.   In talking with the Np that is a OP specialist she felt she was anxious due to the stress of caring for her husband.  She recommended considering medication.   Medications and allergies reviewed with patient and updated if appropriate.  Patient Active Problem List   Diagnosis Date Noted  . Fatigue 01/15/2017  . Decreased sense of taste 01/15/2017  .  Metallic taste 42/87/6811  . Atherosclerotic plaque 11/07/2016  . Chest pain 09/21/2016  . 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 Renal insufficiency 06/29/2016  . RUQ discomfort 06/29/2016  . Osteoporosis 06/21/2016  . Dizziness 05/16/2016  . Arthritis of left shoulder region 01/17/2016  . Ankylosing spondylitis ([place]) 11/08/2015  . Incontinence 10/28/2015  . Thoracic back pain 10/19/2015  . Leg cramps 09/29/2015  . Prediabetes 09/29/2015  . 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 Impingement syndrome of left shoulder region 09/22/2014  . Interstitial cystitis 07/09/2014  . Diverticulosis of colon without hemorrhage 07/08/2014  . Carpal tunnel syndrome of left wrist 04/12/2014  . Food allergy 03/06/2014  . Renal calculi 01/30/2014  . OSA (obstructive sleep apnea), moderate 01/14/2014  . Visual changes 12/17/2013  . Seasonal and perennial allergic rhinitis 12/06/2013  . Seasonal allergic conjunctivitis 12/06/2013  . Pneumonia, primary atypical 05/15/2013  . History of Bell's palsy 02/26/2012  . Vitamin D deficiency 06/27/2010  . 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  . DRY MOUTH 06/27/2010  . ABDOMINAL PAIN, SUPRAPUBIC 05/25/2009  .  GERD 04/30/2009  . DIVERTICULOSIS, COLON 04/30/2009  . History of colonic polyps 04/30/2009  . COSTOCHONDRITIS 01/15/2008  . HYPERLIPIDEMIA 08/21/2007  . Essential hypertension 08/21/2007  . ARTHRALGIA 08/21/2007  . Chronic lumbar radiculopathy 01/25/2007    Current Outpatient Prescriptions on File Prior to Visit  Medication Sig Dispense Refill  . acetaminophen (TYLENOL) 500 MG tablet Take 500 mg by mouth every 4 (four) hours as needed for mild pain.     . Ascorbic Acid (VITAMIN C) 1000 MG tablet Take 1,000 mg by mouth daily.     Marland Kitchen aspirin 81 MG tablet Take 81 mg by mouth daily.      . blood glucose meter kit and supplies KIT Dispense based on patient and  insurance preference. Use up to four times daily as directed. prediabetes 1 each 0  . Calcium Carbonate-Vit D-Min (CALCIUM 1200 PO) Take 600 mg by mouth 2 (two) times daily.     . cetirizine (ZYRTEC) 10 MG tablet Take 10 mg by mouth daily.      . Cholecalciferol (VITAMIN D3) 5000 units TABS Take 5,000 mg by mouth daily.    . Diclofenac Sodium 2 % SOLN Place onto the skin as directed for pain as needed.    . hyoscyamine (LEVSIN SL) 0.125 MG SL tablet Place 1 tablet (0.125 mg total) under the tongue every 4 (four) hours as needed. 15 tablet 1  . meloxicam (MOBIC) 15 MG tablet Take 1 tablet (15 mg total) by mouth daily as needed for pain. With food 90 tablet 2  . metoprolol succinate (TOPROL XL) 25 MG 24 hr tablet Take 1 tablet (25 mg total) by mouth daily. 90 tablet 3  . Misc Natural Products (COSAMIN ASU ADVANCED FORMULA PO) Take 2 each by mouth daily.    . Omega-3 Fatty Acids (FISH OIL BURP-LESS) 1000 MG CAPS Takes 1000 mg BID 60 capsule 0  . omeprazole (PRILOSEC) 40 MG capsule Take 1 capsule (40 mg total) by mouth daily. 30 capsule 0  . ondansetron (ZOFRAN ODT) 4 MG disintegrating tablet Take 1 tablet (4 mg total) by mouth every 8 (eight) hours as needed for nausea or vomiting. 20 tablet 0  . Polyethyl Glycol-Propyl Glycol (SYSTANE OP) Apply 1 drop to eye as needed. Apply to both eyes as needed    . vitamin B-12 (CYANOCOBALAMIN) 1000 MCG tablet Take 1,000 mcg by mouth daily.     . vitamin E 400 UNIT capsule Take 400 Units by mouth daily.    . rosuvastatin (CRESTOR) 5 MG tablet Take 1 tablet (5 mg total) by mouth daily. 30 tablet 1   No current facility-administered medications on file prior to visit.     Past Medical History:  Diagnosis Date  . Allergy    seasonal  . Ankylosing spondylitis ([place])   . Anxiety   . Arthritis   . Bell palsy   . CTS (carpal tunnel syndrome)   . Cystitis   . Diverticulitis   . Diverticulosis of colon   . Dry eye syndrome   . Frozen shoulder   . GERD  (gastroesophageal reflux disease)   . HLA B27 (HLA B27 positive)   . Hx of colonic polyps   . Hyperlipidemia    NMR 2005  . Hypertension   . Hypothyroidism   . Incontinence   . Internal hemorrhoids   . Interstitial cystitis   . Kidney stone   . Menopause   . Osteoarthritis   . Osteopenia    [facility]  [address]  . S/P total hysterectomy and bilateral salpingo-oophorectomy 04/12/2014  . Sleep apnea   . Subjective visual disturbance of both eyes 12/30/2013  . Tubular adenoma of colon   . Vitamin D deficiency   . Xerostomia    and Xeroophthalmia    Past Surgical History:  Procedure Laterality Date  . ABDOMINAL HYSTERECTOMY    . BLADDER SURGERY     for incontinence  . CARPAL TUNNEL RELEASE Left 12/22/2014   Procedure: LEFT CARPAL TUNNEL RELEASE;  Surgeon: Daryll Brod, MD;  Location: [facility];  Service: Orthopedics;  Laterality: Left;  . COLONOSCOPY W/ POLYPECTOMY     adenomatous poyp 04-2008  . colonscopy     tics 10-2002  . CYSTOSCOPY WITH RETROGRADE PYELOGRAM, URETEROSCOPY AND STENT PLACEMENT Left 09/01/2013   Procedure: CYSTOSCOPY WITH RETROGRADE PYELOGRAM, AND LEFT STENT PLACEMENT;  Surgeon: Molli Hazard, MD;  Location: [facility];  Service: Urology;  Laterality: Left;  . elevated LFTs     due to [place]  . FINGER SURGERY Left 2004   2nd finger  . G 1 P 1    . interstitial cystitis     with clot in catheter  . SHOULDER ARTHROSCOPY Left   . TOTAL ABDOMINAL HYSTERECTOMY W/ BILATERAL SALPINGOOPHORECTOMY     dysfunctional menses    Social History   Social History  . Marital status: Married    Spouse name: N/A  . Number of children: 1  . Years of education: BS   Occupational History  . retired   . teacher     Kindergarten   Social History Main Topics  . Smoking status: Never Smoker  . Smokeless tobacco: Never Used  . Alcohol use Yes     Comment: occ  . Drug use: No  . Sexual activity: Not Currently   Other Topics Concern  .  Not on file   Social History Narrative   Married          Family History  Problem Relation Age of Onset  . Coronary artery disease Mother   . Hypertension Mother   . Asthma Mother   . Osteoporosis Mother   . Dementia Mother   . Stroke Mother        mini cva  . Diabetes Father   . Cirrhosis Father        non alcoholic  . Cancer Sister        colon  . Colon cancer Sister 65  . Cancer Paternal Aunt        2 aunts had renal cancer  . Coronary artery disease Maternal Grandfather   . Cancer Maternal Aunt        breast  . Diabetes Paternal Aunt   . Diabetes Paternal Grandmother     Review of Systems  Constitutional: Negative for chills and fever.  HENT: Positive for congestion (clear nasal mucus), ear pain (occasionally ear pain), sinus pain (aches all the times), trouble swallowing (occasionally, trouble swallowing water or food - does not get stuck) and voice change (sometimes). Negative for mouth sores, postnasal drip, sinus pressure and sore throat.        Dry mouth  Respiratory: Positive for cough (at night). Negative for shortness of breath and wheezing.   Skin: Positive for rash.  Neurological: Positive for light-headedness (sometimes) and headaches (occasional). Negative for dizziness.  Psychiatric/Behavioral: Negative for dysphoric mood. The patient is nervous/anxious.        Objective:   Vitals:   02/12/17 1300  BP: (!) 154/82  Pulse: 80  Resp: 16  Temp: 98.1 F (36.7 C)   Filed Weights   02/12/17 1300  Weight: 173 lb (78.5 kg)   Body mass index is 31.64 kg/m.  Wt Readings from Last 3 Encounters:  02/12/17 173 lb (78.5 kg)  01/26/17 170 lb 12 oz (77.5 kg)  01/25/17 181 lb 12.8 oz (82.5 kg)     Physical Exam GENERAL APPEARANCE: Appears stated age, well appearing, NAD EYES: conjunctiva clear, no icterus HEENT: bilateral tympanic membranes and ear canals normal, oropharynx with no erythema, some white coating of her tongue, no thyromegaly, trachea  midline, no cervical or supraclavicular lymphadenopathy LUNGS: Clear to auscultation without wheeze or crackles, unlabored breathing, good air entry bilaterally HEART: Normal S1,S2 without murmurs EXTREMITIES: Without clubbing, cyanosis, or edema SKIN: mildly erythematous rash that is scaly around neck - much more faded that when it first appeared ( pictures from phone )        Assessment & Plan:   45 minutes were spent face-to-face with the patient, over 50% of which was spent reviewing symptoms and counseling regarding anxiety treatments and possible treatments for current taste dysfunction - we ultimately decided that since she was seeing ENT tomorrow we  Should hold off on treatment until she is evaluated by them - may need rheum referral, further evaluation of fatigue - she will follow up with me via mychart after her ENT visit     See Problem List for Assessment and Plan of chronic medical problems.

## 2017-02-12 ENCOUNTER — Ambulatory Visit (INDEPENDENT_AMBULATORY_CARE_PROVIDER_SITE_OTHER): Payer: Medicare Other | Admitting: Internal Medicine

## 2017-02-12 ENCOUNTER — Encounter: Payer: Self-pay | Admitting: Internal Medicine

## 2017-02-12 VITALS — BP 124/78 | HR 72 | Ht 62.0 in | Wt 172.2 lb

## 2017-02-12 VITALS — BP 154/82 | HR 80 | Temp 98.1°F | Resp 16 | Wt 173.0 lb

## 2017-02-12 DIAGNOSIS — R21 Rash and other nonspecific skin eruption: Secondary | ICD-10-CM

## 2017-02-12 DIAGNOSIS — R198 Other specified symptoms and signs involving the digestive system and abdomen: Secondary | ICD-10-CM

## 2017-02-12 DIAGNOSIS — R0989 Other specified symptoms and signs involving the circulatory and respiratory systems: Secondary | ICD-10-CM

## 2017-02-12 DIAGNOSIS — R109 Unspecified abdominal pain: Secondary | ICD-10-CM | POA: Diagnosis not present

## 2017-02-12 DIAGNOSIS — Z8719 Personal history of other diseases of the digestive system: Secondary | ICD-10-CM | POA: Diagnosis not present

## 2017-02-12 DIAGNOSIS — K219 Gastro-esophageal reflux disease without esophagitis: Secondary | ICD-10-CM

## 2017-02-12 DIAGNOSIS — F32A Depression, unspecified: Secondary | ICD-10-CM | POA: Insufficient documentation

## 2017-02-12 DIAGNOSIS — R438 Other disturbances of smell and taste: Secondary | ICD-10-CM | POA: Diagnosis not present

## 2017-02-12 DIAGNOSIS — R5383 Other fatigue: Secondary | ICD-10-CM

## 2017-02-12 DIAGNOSIS — F458 Other somatoform disorders: Secondary | ICD-10-CM | POA: Diagnosis not present

## 2017-02-12 DIAGNOSIS — F419 Anxiety disorder, unspecified: Secondary | ICD-10-CM | POA: Diagnosis not present

## 2017-02-12 DIAGNOSIS — R432 Parageusia: Secondary | ICD-10-CM

## 2017-02-12 MED ORDER — OMEPRAZOLE 40 MG PO CPDR: 40.0000 mg | DELAYED_RELEASE_CAPSULE | Freq: Every day | ORAL | 2 refills | Status: DC

## 2017-02-12 NOTE — Patient Instructions (Addendum)
  Medications reviewed and updated.  No changes recommended at this time.     

## 2017-02-12 NOTE — Progress Notes (Signed)
Subjective:    Patient ID: [name], female    DOB: [birth year], 70 y.o.   MRN: [id number]  HPI Briasia Flinders is a 71 year old female with a past medical history of adenomatous colon polyps, colonic diverticulosis, GERD and IBS is here for follow-up. She was last seen by me in April 2018 and by Ellouise Newer, PA-C on 12/13/2016. After her last visit she was treated for diverticulitis with Cipro and Flagyl. She is here alone today. She also has history of HLA-B27 positive ankylosing spondylitis, arthritis, hyperlipidemia, hypertension, hypothyroidism, vitamin D efficiency and osteoporosis.  She reports that her doubling over left-sided abdominal pain has improved dramatically. She still has an intermittent low level of left lower quadrant discomfort which is present on most days. This is crampy in nature. Levsin can help with this pain at times. Bowel habits have remained fairly regular without blood in her stool or melena.  She was changed from ranitidine to omeprazole by primary care several weeks ago though she only started omeprazole 3 days ago. This was changed for a metallic taste in her mouth as well as hoarseness. There was question of uncontrolled reflux. She had previously been using ranitidine 150 twice a day. Today she describes that the bad taste in her mouth continues. Food has lost its usual taste for her. She's also reporting globus sensation feeling that there is something present behind her tongue. She has an ENT visit scheduled for this symptom also. She denies dysphagia and odynophagia. She denies traditional heartburn. She's having some nausea without vomiting, nausea is worse at night. There are times that she will feel a sour belch.  No fevers or chills.  She does feel extremely tired and exhausted. Primary care is working these symptoms up further.  She sees the osteoporosis clinic at [facility] in her [place] is being held currently. She reports it is their opinion that she is  having stress reactions related to being the primary caregiver for her husband.   Review of Systems As per history of present illness, otherwise negative  Current Medications, Allergies, Past Medical History, Past Surgical History, Family History and Social History were reviewed in [employer] record.     Objective:   Physical Exam There were no vitals taken for this visit. Constitutional: Well-developed and well-nourished. No distress. HEENT: Normocephalic and atraumatic. Oropharynx is clear and moist. Conjunctivae are normal.  No scleral icterus. Neck: Neck supple. Trachea midline. Cardiovascular: Normal rate, regular rhythm and intact distal pulses. No M/R/G Pulmonary/chest: Effort normal and breath sounds normal. No wheezing, rales or rhonchi. Abdominal: Soft, Mild left abdominal tenderness without rebound or guarding, nondistended. Bowel sounds active throughout. There are no masses palpable. No hepatosplenomegaly. Extremities: no clubbing, cyanosis, or edema Neurological: Alert and oriented to person place and time. Skin: Skin is warm and dry. Psychiatric: Normal mood and affect. Behavior is normal.  CBC    Component Value Date/Time   WBC 7.1 01/15/2017 1418   RBC 4.20 01/15/2017 1418   HGB 12.4 01/15/2017 1418   HCT 37.2 01/15/2017 1418   PLT 269.0 01/15/2017 1418   MCV 88.5 01/15/2017 1418   MCH 30.2 10/12/2016 2156   MCHC 33.5 01/15/2017 1418   RDW 13.6 01/15/2017 1418   LYMPHSABS 2.0 01/15/2017 1418   MONOABS 0.6 01/15/2017 1418   EOSABS 0.2 01/15/2017 1418   BASOSABS 0.1 01/15/2017 1418   CMP     Component Value Date/Time   NA 138 01/15/2017 1418   NA 138  12/07/2016 0741   K 4.1 01/15/2017 1418   CL 102 01/15/2017 1418   CO2 30 01/15/2017 1418   GLUCOSE 91 01/15/2017 1418   GLUCOSE 99 07/23/2006 1101   BUN 16 01/15/2017 1418   BUN 20 12/07/2016 0741   CREATININE 1.04 01/15/2017 1418   CALCIUM 10.4 01/15/2017 1418   PROT 7.3  01/15/2017 1418   PROT 7.1 12/07/2016 0741   ALBUMIN 4.3 01/15/2017 1418   ALBUMIN 4.2 12/07/2016 0741   AST 24 01/15/2017 1418   ALT 26 01/15/2017 1418   ALKPHOS 72 01/15/2017 1418   BILITOT 0.3 01/15/2017 1418   BILITOT 0.5 12/07/2016 0741   GFRNONAA 49 (L) 12/07/2016 0741   GFRAA 57 (L) 12/07/2016 0741       Assessment & Plan:  71 year old female with a past medical history of adenomatous colon polyps, colonic diverticulosis, GERD and IBS is here for follow-up.  1. Left-sided abdominal pain/diverticulosis/diverticulitis May 2018 -- she was treated for presumed diverticulitis with Cipro and Flagyl which she completed. Her pain is considerably better but she is still having some intermittent left-sided discomfort. For this reason for going to perform CT scan of the abdomen and pelvis with contrast. If this is unremarkable, query segmental colitis associated with diverticulitis. Could consider mesalamine if not improving.  2. Globus sensation/history of GERD/altered taste/hoarseness -- Flagyl, taken in May for diverticulitis, can cause metallic taste in the mouth but this is usually limited to during administration of the medicine. This may be a reflux symptom. Agree with an adequate trial of PPI. She has only had 3 days to this point. She will hold the ranitidine for now. After one month of omeprazole 40 mg daily if globus sensation persists, I would recommend upper endoscopy. She will contact me on or around 03/14/2017 with an update on her symptoms. If persistent proceed to upper endoscopy.  Finally I will try her on probiotic VSL 3,1 capsule daily.   Follow-up in 3-4 months, sooner needed 25 minutes spent with the patient today. Greater than 50% was spent in counseling and coordination of care with the patient

## 2017-02-12 NOTE — Patient Instructions (Signed)
Continue omeprazole 40 mg daily 30 mg before breakfast.  If your horeseness or globus sensation persists past 03/14/17, call our office. We may need to perform endoscopy.  Please take VSL #3, 1 tablet daily.  ----------------------------------------------------------------------------------------------------------------------------------------------- You have been scheduled for a CT scan of the abdomen and pelvis at [facility] ([address]---this is in the same building as [facility]).   You are scheduled on 02/13/17 at 4:00 pm. You should arrive 15 minutes prior to your appointment time for registration. Please follow the written instructions below on the day of your exam:  WARNING: IF YOU ARE ALLERGIC TO IODINE/X-RAY DYE, PLEASE NOTIFY RADIOLOGY IMMEDIATELY AT [phone]! YOU WILL BE GIVEN A 13 HOUR PREMEDICATION PREP.  1) Do not eat or drink anything after 12:00 pm (4 hours prior to your test) 2) You have been given 2 bottles of oral contrast to drink. The solution may taste better if refrigerated, but do NOT add ice or any other liquid to this solution. Shake well before drinking.    Drink 1 bottle of contrast @ 2:00 pm (2 hours prior to your exam)  Drink 1 bottle of contrast @ 3:00 pm (1 hour prior to your exam)  You may take any medications as prescribed with a small amount of water except for the following: Metformin, Glucophage, Glucovance, Avandamet, Riomet, Fortamet, Actoplus Met, Janumet, Glumetza or Metaglip. The above medications must be held the day of the exam AND 48 hours after the exam.  The purpose of you drinking the oral contrast is to aid in the visualization of your intestinal tract. The contrast solution may cause some diarrhea. Before your exam is started, you will be given a small amount of fluid to drink. Depending on your individual set of symptoms, you may also receive an intravenous injection of x-ray contrast/dye. Plan on being at [facility] for 30 minutes or longer, depending on the type of exam you are having performed.  This test typically takes 30-45 minutes to complete.  If you have any questions regarding your exam or if you need to reschedule, you may call the CT department at [phone] between the hours of 8:00 am and 5:00 pm, Monday-Friday.  ________________________________________________________________________  If you are age 65 or older, your body mass index should be between 23-30. Your Body mass index is 31.5 kg/m. If this is out of the aforementioned range listed, please consider follow up with your Primary Care Provider.  If you are age 64 or younger, your body mass index should be between 19-25. Your Body mass index is 31.5 kg/m. If this is out of the aformentioned range listed, please consider follow up with your Primary Care Provider.    

## 2017-02-12 NOTE — Assessment & Plan Note (Signed)
To see ENT tomorrow

## 2017-02-12 NOTE — Assessment & Plan Note (Signed)
Discussed possible medication that may help - discussed side effects She will think about it - can consider sertraline or cymbalta

## 2017-02-12 NOTE — Assessment & Plan Note (Signed)
Likely allergic/dermatitis Unlikely related to taste issues Getting better - will hold off on treatment - can try otc cortisone 1% cream

## 2017-02-12 NOTE — Assessment & Plan Note (Signed)
Worse, unsure of cause and unsure if related to taste and other symptoms Blood work from last few months reveal no cause Will see what ENT thinks about taste She denies depression On cpap

## 2017-02-12 NOTE — Assessment & Plan Note (Signed)
Has appt with ENT tomorrow ? Thrush - that did not improve with 10 days of nystatin vs chronnic sinus infection, unlikely vitamin def, unlikely neuropathy, no recent dental work Will hold off on start additional treatment since she is seeing ENT tomorrow - will await Dr Pollie Friar evaluation

## 2017-02-13 ENCOUNTER — Telehealth: Payer: Self-pay | Admitting: Internal Medicine

## 2017-02-13 ENCOUNTER — Ambulatory Visit (INDEPENDENT_AMBULATORY_CARE_PROVIDER_SITE_OTHER)
Admission: RE | Admit: 2017-02-13 | Discharge: 2017-02-13 | Disposition: A | Discharge status: Home / Self Care | Payer: Medicare Other | Source: Ambulatory Visit | Attending: Internal Medicine | Admitting: Internal Medicine

## 2017-02-13 ENCOUNTER — Other Ambulatory Visit: Payer: Self-pay | Admitting: Cardiology

## 2017-02-13 DIAGNOSIS — K5730 Diverticulosis of large intestine without perforation or abscess without bleeding: Secondary | ICD-10-CM | POA: Diagnosis not present

## 2017-02-13 DIAGNOSIS — R109 Unspecified abdominal pain: Secondary | ICD-10-CM

## 2017-02-13 DIAGNOSIS — J310 Chronic rhinitis: Secondary | ICD-10-CM | POA: Diagnosis not present

## 2017-02-13 DIAGNOSIS — R490 Dysphonia: Secondary | ICD-10-CM | POA: Diagnosis not present

## 2017-02-13 DIAGNOSIS — I7090 Unspecified atherosclerosis: Secondary | ICD-10-CM

## 2017-02-13 DIAGNOSIS — R1313 Dysphagia, pharyngeal phase: Secondary | ICD-10-CM | POA: Diagnosis not present

## 2017-02-13 MED ORDER — IOPAMIDOL (ISOVUE-300) INJECTION 61%: 100.0000 mL | Freq: Once | INTRAVENOUS | Status: DC | PRN

## 2017-02-13 MED FILL — ROSUVASTATIN CALCIUM 5 MG T: 5 | 30 days supply | Qty: 30 | Fill #0

## 2017-02-13 NOTE — Telephone Encounter (Signed)
Pt wanted to make sure Dr. Hilarie Fredrickson didn't want her to have a CT of the chest also. Confirmed with Dr. Hilarie Fredrickson that he only wanted her to have A/P. If she continues to have globus sensation/hoarseness she would need an egd as OV note states. Pt aware.

## 2017-02-14 ENCOUNTER — Encounter: Payer: Self-pay | Admitting: Internal Medicine

## 2017-02-14 MED FILL — TRIAMCINOLONE ACETONIDE 55: 55 | 30 days supply | Qty: 17 | Fill #0

## 2017-02-14 MED FILL — FLUTICASONE PROP 50 MCG SPR: 50 | 30 days supply | Qty: 16 | Fill #0

## 2017-02-16 ENCOUNTER — Other Ambulatory Visit: Payer: Self-pay

## 2017-02-16 MED ORDER — MESALAMINE 1.2 G PO TBEC: 2.4000 g | DELAYED_RELEASE_TABLET | Freq: Every day | ORAL | 1 refills | Status: DC

## 2017-02-16 MED FILL — LIALDA 1.2 GM TABLET SA: 1.2 | 30 days supply | Qty: 60 | Fill #0

## 2017-02-20 DIAGNOSIS — R5383 Other fatigue: Secondary | ICD-10-CM | POA: Diagnosis not present

## 2017-02-20 DIAGNOSIS — K219 Gastro-esophageal reflux disease without esophagitis: Secondary | ICD-10-CM | POA: Diagnosis not present

## 2017-02-20 DIAGNOSIS — R439 Unspecified disturbances of smell and taste: Secondary | ICD-10-CM | POA: Diagnosis not present

## 2017-02-20 DIAGNOSIS — Z9181 History of falling: Secondary | ICD-10-CM | POA: Diagnosis not present

## 2017-02-20 DIAGNOSIS — M810 Age-related osteoporosis without current pathological fracture: Secondary | ICD-10-CM | POA: Diagnosis not present

## 2017-02-20 DIAGNOSIS — Z8262 Family history of osteoporosis: Secondary | ICD-10-CM | POA: Diagnosis not present

## 2017-02-20 DIAGNOSIS — R21 Rash and other nonspecific skin eruption: Secondary | ICD-10-CM | POA: Diagnosis not present

## 2017-02-20 DIAGNOSIS — M25519 Pain in unspecified shoulder: Secondary | ICD-10-CM | POA: Diagnosis not present

## 2017-02-20 DIAGNOSIS — Z729 Problem related to lifestyle, unspecified: Secondary | ICD-10-CM | POA: Diagnosis not present

## 2017-02-22 ENCOUNTER — Encounter: Payer: Self-pay | Admitting: Neurology

## 2017-02-22 ENCOUNTER — Ambulatory Visit (INDEPENDENT_AMBULATORY_CARE_PROVIDER_SITE_OTHER): Payer: Medicare Other | Admitting: Neurology

## 2017-02-22 VITALS — BP 140/82 | HR 69 | Ht 62.0 in | Wt 172.5 lb

## 2017-02-22 DIAGNOSIS — R439 Unspecified disturbances of smell and taste: Secondary | ICD-10-CM

## 2017-02-22 NOTE — Patient Instructions (Signed)
   Call our office in 3 months if there is no improvement in taste.

## 2017-02-22 NOTE — Progress Notes (Signed)
Reason for visit: Altered taste  [name] is an 71 y.o. female  History of present illness:  [name] is a 71 year old right-handed white female who was seen previously 2 years ago for migraine-type events. The patient comes in today for a new problem. The patient claims that over the last 6 or 8 weeks she has noted a gradual change in her ability to taste food. In May 2018, she had an episode of diverticulitis, she was placed on Flagyl and Cipro combination for 10 days. The patient has had some gradual change in taste sensation since that time, seemingly more significant over the last 3 weeks. The patient indicates that water has a metallic taste to it. She has difficulty discerning the taste of certain other foods, but she may have base sensation of salty, sour, sweet, or bitter. The patient has been seen by an ENT physician, no evidence of sinusitis was noted. The patient denies any recent history of head trauma. The patient denies any other symptoms of numbness or weakness on the arms or legs, she denies any balance changes. She does not smoke cigarettes. She returns for further evaluation. The patient denies any recent viral illness or upper respiratory tract symptoms. She has no sinus drainage or allergy symptoms.  Past Medical History:  Diagnosis Date  . Allergy    seasonal  . Ankylosing spondylitis ([place])   . Anxiety   . Arthritis   . Bell palsy   . CTS (carpal tunnel syndrome)   . Cystitis   . Diverticulitis   . Diverticulosis of colon   . Dry eye syndrome   . Frozen shoulder   . GERD (gastroesophageal reflux disease)   . HLA B27 (HLA B27 positive)   . Hx of colonic polyps   . Hyperlipidemia    NMR 2005  . Hypertension   . Hypothyroidism   . Incontinence   . Internal hemorrhoids   . Interstitial cystitis   . Kidney stone   . Menopause   . Osteoarthritis   . Osteopenia    BMD done [facility] , [facility]  . S/P total hysterectomy and bilateral  salpingo-oophorectomy 04/12/2014  . Sleep apnea   . Subjective visual disturbance of both eyes 12/30/2013  . Tubular adenoma of colon   . Vitamin D deficiency   . Xerostomia    and Xeroophthalmia    Past Surgical History:  Procedure Laterality Date  . ABDOMINAL HYSTERECTOMY    . BLADDER SURGERY     for incontinence  . CARPAL TUNNEL RELEASE Left 12/22/2014   Procedure: LEFT CARPAL TUNNEL RELEASE;  Surgeon: Daryll Brod, MD;  Location: [place];  Service: Orthopedics;  Laterality: Left;  . COLONOSCOPY W/ POLYPECTOMY     adenomatous poyp 04-2008  . colonscopy     tics 10-2002  . CYSTOSCOPY WITH RETROGRADE PYELOGRAM, URETEROSCOPY AND STENT PLACEMENT Left 09/01/2013   Procedure: CYSTOSCOPY WITH RETROGRADE PYELOGRAM, AND LEFT STENT PLACEMENT;  Surgeon: Molli Hazard, MD;  Location[facility];  Service: Urology;  Laterality: Left;  . elevated LFTs     due to Elmiron  . FINGER SURGERY Left 2004   2nd finger  . G 1 P 1    . interstitial cystitis     with clot in catheter  . SHOULDER ARTHROSCOPY Left   . TOTAL ABDOMINAL HYSTERECTOMY W/ BILATERAL SALPINGOOPHORECTOMY     dysfunctional menses    Family History  Problem Relation Age of Onset  . Coronary  artery disease Mother   . Hypertension Mother   . Asthma Mother   . Osteoporosis Mother   . Dementia Mother   . Stroke Mother        mini cva  . Diabetes Father   . Cirrhosis Father        non alcoholic  . Cancer Sister        colon  . Colon cancer Sister 65  . Cancer Paternal Aunt        2 aunts had renal cancer  . Coronary artery disease Maternal Grandfather   . Cancer Maternal Aunt        breast  . Diabetes Paternal Aunt   . Diabetes Paternal Grandmother     Social history:  reports that she has never smoked. She has never used smokeless tobacco. She reports that she drinks alcohol. She reports that she does not use drugs.    Allergies  Allergen Reactions  . Nitroglycerin Anaphylaxis    Hypotensive  after NTG administration in ER during chest pain evaluation  . Pentosan Polysulfate Sodium     Elevated LFTs.......Marland Kitchen elmiron   . Bactrim [Sulfamethoxazole-Trimethoprim]   . Promethazine Hcl     "jittery on the inside"  . Lisinopril Cough  . Sulfamethoxazole-Trimethoprim Nausea Only    Unknown reaction per pt    Medications:  Prior to Admission medications   Medication Sig Start Date End Date Taking? Authorizing Provider  acetaminophen (TYLENOL) 500 MG tablet Take 500 mg by mouth every 4 (four) hours as needed for mild pain.    Yes [provider]  Ascorbic Acid (VITAMIN C) 1000 MG tablet Take 1,000 mg by mouth daily.    Yes [provider]  aspirin 81 MG tablet Take 81 mg by mouth daily.     Yes [provider]  blood glucose meter kit and supplies KIT Dispense based on patient and insurance preference. Use up to four times daily as directed. prediabetes 10/28/15  Yes Burns, Claudina Lick, MD  Calcium Carbonate-Vit D-Min (CALCIUM 1200 PO) Take 600 mg by mouth daily.    Yes [provider]  cetirizine (ZYRTEC) 10 MG tablet Take 10 mg by mouth daily.     Yes [provider]  Cholecalciferol (VITAMIN D3) 5000 units TABS Take 5,000 mg by mouth daily.   Yes [provider]  Diclofenac Sodium 2 % SOLN Place onto the skin as directed for pain as needed.   Yes [provider]  hyoscyamine (LEVSIN SL) 0.125 MG SL tablet Place 1 tablet (0.125 mg total) under the tongue every 4 (four) hours as needed. 12/13/16  Yes Levin Erp, PA  MAGNESIUM PO Take 1 capsule by mouth daily. With vitamin B6   Yes [provider]  meloxicam (MOBIC) 15 MG tablet Take 1 tablet (15 mg total) by mouth daily as needed for pain. With food 09/11/16  Yes Nche, Charlene Brooke, NP  mesalamine (LIALDA) 1.2 g EC tablet Take 2 tablets (2.4 g total) by mouth daily with breakfast. 02/16/17  Yes Pyrtle, Lajuan Lines, MD  metoprolol succinate (TOPROL XL) 25 MG 24 hr  tablet Take 1 tablet (25 mg total) by mouth daily. 10/17/16  Yes Dorothy Spark, MD  Misc Natural Products (COSAMIN ASU ADVANCED FORMULA PO) Take 2 each by mouth daily.   Yes [provider]  Omega-3 Fatty Acids (FISH OIL BURP-LESS) 1000 MG CAPS Takes 1000 mg BID 01/03/16  Yes Esterwood, Amy S, PA-C  omeprazole (PRILOSEC) 40 MG  capsule Take 1 capsule (40 mg total) by mouth daily. 02/12/17  Yes Pyrtle, Lajuan Lines, MD  ondansetron (ZOFRAN ODT) 4 MG disintegrating tablet Take 1 tablet (4 mg total) by mouth every 8 (eight) hours as needed for nausea or vomiting. 10/31/16  Yes Burns, Claudina Lick, MD  Polyethyl Glycol-Propyl Glycol (SYSTANE OP) Apply 1 drop to eye as needed. Apply to both eyes as needed   Yes [provider]  Probiotic Product (PROBIOTIC PO) Take 1 Dose by mouth daily.   Yes [provider]  rosuvastatin (CRESTOR) 5 MG tablet TAKE 1 TABLET (5 MG TOTAL) BY MOUTH DAILY. 02/13/17 05/14/17 Yes Dorothy Spark, MD  vitamin B-12 (CYANOCOBALAMIN) 1000 MCG tablet Take 1,000 mcg by mouth daily.    Yes [provider]  vitamin E 400 UNIT capsule Take 400 Units by mouth daily.   Yes [provider]    ROS:  Out of a complete 14 system review of symptoms, the patient complains only of the following symptoms, and all other reviewed systems are negative.  Fatigue Daytime sleepiness Skin rash  Blood pressure 140/82, pulse 69, height 5' 2"  (1.575 m), weight 172 lb 8 oz (78.2 kg), SpO2 98 %.  Physical Exam  General: The patient is alert and cooperative at the time of the examination.  Skin: No significant peripheral edema is noted.   Neurologic Exam  Mental status: The patient is alert and oriented x 3 at the time of the examination. The patient has apparent normal recent and remote memory, with an apparently normal attention span and concentration ability.   Cranial nerves: Facial symmetry is present. Speech is normal, no aphasia or dysarthria is  noted. Extraocular movements are full. Visual fields are full.  Motor: The patient has good strength in all 4 extremities.  Sensory examination: Soft touch sensation is symmetric on the face, arms, and legs.  Coordination: The patient has good finger-nose-finger and heel-to-shin bilaterally.  Gait and station: The patient has a normal gait. Tandem gait is normal. Romberg is negative. No drift is seen.  Reflexes: Deep tendon reflexes are symmetric.   Assessment/Plan:  1. Taste alteration  2. Migraine headache  At this time, the most likely culprit of the alteration in taste may be the use of metronidazole, the addition of Cipro may have increased the chances of taste alteration. The patient does report a metallic taste sensation that is common with metronidazole. It appears that the patient may be getting worse even after cessation of the antibiotics, it is possible there may be a "coasting phenomenon". I have recommended that the patient go on a multivitamin with zinc and selenium, she is to contact our office if the symptoms are not improving after 3 months, if this is the case I may pursue MRI evaluation of the brain. The patient will follow-up if needed.  Jill Alexanders MD 02/22/2017 3:17 PM  [facility] [address] [place], [place] [address]  Phone [phone] Fax [phone]

## 2017-02-28 DIAGNOSIS — G4733 Obstructive sleep apnea (adult) (pediatric): Secondary | ICD-10-CM | POA: Diagnosis not present

## 2017-03-19 MED FILL — OMEPRAZOLE DR 40 MG CAPSULE: 40 | 30 days supply | Qty: 30 | Fill #0

## 2017-03-19 MED FILL — ROSUVASTATIN CALCIUM 5 MG T: 5 | 30 days supply | Qty: 30 | Fill #1

## 2017-03-20 ENCOUNTER — Ambulatory Visit: Payer: Self-pay | Admitting: Internal Medicine

## 2017-03-20 MED FILL — MELOXICAM 15 MG TABLET: 15 | 90 days supply | Qty: 90 | Fill #0

## 2017-03-26 DIAGNOSIS — Z1272 Encounter for screening for malignant neoplasm of vagina: Secondary | ICD-10-CM | POA: Diagnosis not present

## 2017-03-26 DIAGNOSIS — Z6832 Body mass index (BMI) 32.0-32.9, adult: Secondary | ICD-10-CM | POA: Diagnosis not present

## 2017-03-26 DIAGNOSIS — Z124 Encounter for screening for malignant neoplasm of cervix: Secondary | ICD-10-CM | POA: Diagnosis not present

## 2017-03-26 DIAGNOSIS — M816 Localized osteoporosis [Lequesne]: Secondary | ICD-10-CM | POA: Diagnosis not present

## 2017-03-27 ENCOUNTER — Encounter: Payer: Self-pay | Admitting: Family Medicine

## 2017-03-27 ENCOUNTER — Ambulatory Visit (INDEPENDENT_AMBULATORY_CARE_PROVIDER_SITE_OTHER): Payer: Medicare Other | Admitting: Family Medicine

## 2017-03-27 DIAGNOSIS — M25512 Pain in left shoulder: Secondary | ICD-10-CM | POA: Diagnosis not present

## 2017-03-27 MED ORDER — GABAPENTIN 100 MG PO CAPS: 200.0000 mg | ORAL_CAPSULE | Freq: Every day | ORAL | 3 refills | Status: DC

## 2017-03-27 MED FILL — GABAPENTIN 100 MG CAPSULE: 100 | 30 days supply | Qty: 60 | Fill #0

## 2017-03-27 NOTE — Assessment & Plan Note (Signed)
Patient given injection today and tolerated the procedure well. Started gabapentin to cover for any cervical radiculopathy and neck of be contributing and can also help with chronic pain. Discussed with patient about the other over-the-counter medications as well as vitamin D supplementation. Discussed the possibility of shoulder replacement again in patient will consider. Follow-up again in 4 weeks

## 2017-03-27 NOTE — Progress Notes (Signed)
[name] [place] [employer], [place] [address] Phone: [phone] Subjective:    I'm seeing this patient by the request  of:    CC:   [id number]  [name] is a 71 y.o. female coming in with complaint of Left shoulder pain. Has known osteophytic changes of the shoulder. Would need to potentially placement but has been trying to avoid it. Worsening symptoms at the time. States that the muscles from the neck to the shoulder has become increasingly tight. Waking her up at night. Rates the severity of pain is 8 out of 10. States that it can wake her up at night. Has tried topical anti-inflammatories. Does not want anymore injections into the shoulder itself but no significant improvement with the last one.     Past Medical History:  Diagnosis Date  . Allergy    seasonal  . Ankylosing spondylitis ([place])   . Anxiety   . Arthritis   . Bell palsy   . CTS (carpal tunnel syndrome)   . Cystitis   . Diverticulitis   . Diverticulosis of colon   . Dry eye syndrome   . Frozen shoulder   . GERD (gastroesophageal reflux disease)   . HLA B27 (HLA B27 positive)   . Hx of colonic polyps   . Hyperlipidemia    NMR 2005  . Hypertension   . Hypothyroidism   . Incontinence   . Internal hemorrhoids   . Interstitial cystitis   . Kidney stone   . Menopause   . Osteoarthritis   . Osteopenia    BMD done [facility]  . S/P total hysterectomy and bilateral salpingo-oophorectomy 04/12/2014  . Sleep apnea   . Subjective visual disturbance of both eyes 12/30/2013  . Tubular adenoma of colon   . Vitamin D deficiency   . Xerostomia    and Xeroophthalmia   Past Surgical History:  Procedure Laterality Date  . ABDOMINAL HYSTERECTOMY    . BLADDER SURGERY     for incontinence  . CARPAL TUNNEL RELEASE Left 12/22/2014   Procedure: LEFT CARPAL TUNNEL RELEASE;  Surgeon: Daryll Brod, MD;  Location: [place];  Service: Orthopedics;   Laterality: Left;  . COLONOSCOPY W/ POLYPECTOMY     adenomatous poyp 04-2008  . colonscopy     tics 10-2002  . CYSTOSCOPY WITH RETROGRADE PYELOGRAM, URETEROSCOPY AND STENT PLACEMENT Left 09/01/2013   Procedure: CYSTOSCOPY WITH RETROGRADE PYELOGRAM, AND LEFT STENT PLACEMENT;  Surgeon: Molli Hazard, MD;  [facility];  Service: Urology;  Laterality: Left;  . elevated LFTs     due to Elmiron  . FINGER SURGERY Left 2004   2nd finger  . G 1 P 1    . interstitial cystitis     with clot in catheter  . SHOULDER ARTHROSCOPY Left   . TOTAL ABDOMINAL HYSTERECTOMY W/ BILATERAL SALPINGOOPHORECTOMY     dysfunctional menses   Social History   Social History  . Marital status: Married    Spouse name: N/A  . Number of children: 1  . Years of education: BS   Occupational History  . retired   . teacher     Kindergarten   Social History Main Topics  . Smoking status: Never Smoker  . Smokeless tobacco: Never Used  . Alcohol use Yes     Comment: occ  . Drug use: No  . Sexual activity: Not Currently   Other Topics Concern  . None  Social History Narrative   Lives   Caffeine use:    Married         Allergies  Allergen Reactions  . Nitroglycerin Anaphylaxis    Hypotensive after NTG administration in ER during chest pain evaluation  . Pentosan Polysulfate Sodium     Elevated LFTs.......Marland Kitchen elmiron   . Bactrim [Sulfamethoxazole-Trimethoprim]   . Promethazine Hcl     "jittery on the inside"  . Lisinopril Cough  . Sulfamethoxazole-Trimethoprim Nausea Only    Unknown reaction per pt   Family History  Problem Relation Age of Onset  . Coronary artery disease Mother   . Hypertension Mother   . Asthma Mother   . Osteoporosis Mother   . Dementia Mother   . Stroke Mother        mini cva  . Diabetes Father   . Cirrhosis Father        non alcoholic  . Cancer Sister        colon  . Colon cancer Sister 78  . Cancer Paternal Aunt        2 aunts had renal cancer  .  Coronary artery disease Maternal Grandfather   . Cancer Maternal Aunt        breast  . Diabetes Paternal Aunt   . Diabetes Paternal Grandmother      Past medical history, social, surgical and family history all reviewed in electronic medical record.  No pertanent information unless stated regarding to the chief complaint.   Review of Systems:Review of systems updated and as accurate as of 03/27/17  No headache, visual changes, nausea, vomiting, diarrhea, constipation, dizziness, abdominal pain, skin rash, fevers, chills, night sweats, weight loss, swollen lymph nodes, body aches, joint swelling, muscle aches, chest pain, shortness of breath, mood changes.   Objective  Blood pressure (!) 140/94, pulse (!) 56, weight 174 lb (78.9 kg). Systems examined below as of 03/27/17   General: No apparent distress alert and oriented x3 mood and affect normal, dressed appropriately.  HEENT: Pupils equal, extraocular movements intact  Respiratory: Patient's speak in full sentences and does not appear short of breath  Cardiovascular: No lower extremity edema, non tender, no erythema  Skin: Warm dry intact with no signs of infection or rash on extremities or on axial skeleton.  Abdomen: Soft nontender  Neuro: Cranial nerves II through XII are intact, neurovascularly intact in all extremities with 2+ DTRs and 2+ pulses.  Lymph: No lymphadenopathy of posterior or anterior cervical chain or axillae bilaterally.  Gait normal with good balance and coordination.  MSK:  Non tender with full range of motion and good stability and symmetric strength and tone of , elbows, wrist, hip, knee and ankles bilaterally. Arthritic changes of multiple  Left shoulder has significant crepitus and weakness. Tender to palpation. Limited range of motion in all planes. Significant tightness of the left trapezius. Multiple trigger points noted. Neck shows decreased range of motion lacking the last 10 of extension  Procedure  note After verbal consent patient was prepped with alcohol swabs and with a 25-gauge half-inch she was injected in for specific trigger points in the left trapezius muscle. Total of 3 mL of 0.5% Marcaine and 1 mL of Kenalog 40 mg/dL given. No blood loss. Post injection instructions given.  Impression and Recommendations:     This case required medical decision making of moderate complexity.      Note: This dictation was prepared with Dragon dictation along with smaller phrase technology. Any transcriptional errors that  result from this process are unintentional.

## 2017-03-27 NOTE — Progress Notes (Signed)
[name] [address] [place], [place] [address] Phone: [phone] Subjective:    I'm seeing this patient by the request  of:    CC:   [id number]  [name] is a 71 y.o. female coming in with complaint of left shoulder pain.   Onset-  Location Duration-  Character- Aggravating factors- Reliving factors-  Therapies tried-  Severity-     Past Medical History:  Diagnosis Date  . Allergy    seasonal  . Ankylosing spondylitis ([place])   . Anxiety   . Arthritis   . Bell palsy   . CTS (carpal tunnel syndrome)   . Cystitis   . Diverticulitis   . Diverticulosis of colon   . Dry eye syndrome   . Frozen shoulder   . GERD (gastroesophageal reflux disease)   . HLA B27 (HLA B27 positive)   . Hx of colonic polyps   . Hyperlipidemia    NMR 2005  . Hypertension   . Hypothyroidism   . Incontinence   . Internal hemorrhoids   . Interstitial cystitis   . Kidney stone   . Menopause   . Osteoarthritis   . Osteopenia    BMD done [facility] , [facility]  . S/P total hysterectomy and bilateral salpingo-oophorectomy 04/12/2014  . Sleep apnea   . Subjective visual disturbance of both eyes 12/30/2013  . Tubular adenoma of colon   . Vitamin D deficiency   . Xerostomia    and Xeroophthalmia   Past Surgical History:  Procedure Laterality Date  . ABDOMINAL HYSTERECTOMY    . BLADDER SURGERY     for incontinence  . CARPAL TUNNEL RELEASE Left 12/22/2014   Procedure: LEFT CARPAL TUNNEL RELEASE;  Surgeon: Daryll Brod, MD;  Location: [place];  Service: Orthopedics;  Laterality: Left;  . COLONOSCOPY W/ POLYPECTOMY     adenomatous poyp 04-2008  . colonscopy     tics 10-2002  . CYSTOSCOPY WITH RETROGRADE PYELOGRAM, URETEROSCOPY AND STENT PLACEMENT Left 09/01/2013   Procedure: CYSTOSCOPY WITH RETROGRADE PYELOGRAM, AND LEFT STENT PLACEMENT;  Surgeon: Molli Hazard, MD;  [facility];  Service: Urology;  Laterality: Left;  .  elevated LFTs     due to Elmiron  . FINGER SURGERY Left 2004   2nd finger  . G 1 P 1    . interstitial cystitis     with clot in catheter  . SHOULDER ARTHROSCOPY Left   . TOTAL ABDOMINAL HYSTERECTOMY W/ BILATERAL SALPINGOOPHORECTOMY     dysfunctional menses   Social History   Social History  . Marital status: Married    Spouse name: N/A  . Number of children: 1  . Years of education: BS   Occupational History  . retired   . teacher     Kindergarten   Social History Main Topics  . Smoking status: Never Smoker  . Smokeless tobacco: Never Used  . Alcohol use Yes     Comment: occ  . Drug use: No  . Sexual activity: Not Currently   Other Topics Concern  . Not on file   Social History Narrative   Lives   Caffeine use:    Married         Allergies  Allergen Reactions  . Nitroglycerin Anaphylaxis    Hypotensive after NTG administration in ER during chest pain evaluation  . Pentosan Polysulfate Sodium     Elevated LFTs.......Marland Kitchen elmiron   . Bactrim [Sulfamethoxazole-Trimethoprim]   .  Promethazine Hcl     "jittery on the inside"  . Lisinopril Cough  . Sulfamethoxazole-Trimethoprim Nausea Only    Unknown reaction per pt   Family History  Problem Relation Age of Onset  . Coronary artery disease Mother   . Hypertension Mother   . Asthma Mother   . Osteoporosis Mother   . Dementia Mother   . Stroke Mother        mini cva  . Diabetes Father   . Cirrhosis Father        non alcoholic  . Cancer Sister        colon  . Colon cancer Sister 45  . Cancer Paternal Aunt        2 aunts had renal cancer  . Coronary artery disease Maternal Grandfather   . Cancer Maternal Aunt        breast  . Diabetes Paternal Aunt   . Diabetes Paternal Grandmother      Past medical history, social, surgical and family history all reviewed in electronic medical record.  No pertanent information unless stated regarding to the chief complaint.   Review of Systems:Review of systems  updated and as accurate as of 03/27/17  No headache, visual changes, nausea, vomiting, diarrhea, constipation, dizziness, abdominal pain, skin rash, fevers, chills, night sweats, weight loss, swollen lymph nodes, body aches, joint swelling, muscle aches, chest pain, shortness of breath, mood changes.   Objective  There were no vitals taken for this visit. Systems examined below as of 03/27/17   General: No apparent distress alert and oriented x3 mood and affect normal, dressed appropriately.  HEENT: Pupils equal, extraocular movements intact  Respiratory: Patient's speak in full sentences and does not appear short of breath  Cardiovascular: No lower extremity edema, non tender, no erythema  Skin: Warm dry intact with no signs of infection or rash on extremities or on axial skeleton.  Abdomen: Soft nontender  Neuro: Cranial nerves II through XII are intact, neurovascularly intact in all extremities with 2+ DTRs and 2+ pulses.  Lymph: No lymphadenopathy of posterior or anterior cervical chain or axillae bilaterally.  Gait normal with good balance and coordination.  MSK:  Non tender with full range of motion and good stability and symmetric strength and tone of shoulders, elbows, wrist, hip, knee and ankles bilaterally.     Impression and Recommendations:     This case required medical decision making of moderate complexity.      Note: This dictation was prepared with Dragon dictation along with smaller phrase technology. Any transcriptional errors that result from this process are unintentional.

## 2017-03-27 NOTE — Patient Instructions (Addendum)
Good to see you  We did trigger injection Ice is your friend.  Gabapentin 100-200mg  nightly.   Yes you can do the pennsaid and meloxicam  [facility] or [facility] for your husband.  Also discuss with surgeon about inpatient rehab for you and can get home health for husband.  See me again in 4 weeks.

## 2017-04-11 ENCOUNTER — Telehealth: Payer: Self-pay | Admitting: Cardiology

## 2017-04-11 DIAGNOSIS — I7090 Unspecified atherosclerosis: Secondary | ICD-10-CM

## 2017-04-11 NOTE — Telephone Encounter (Signed)
Copied from 01/25/17 office visit with Janine Hammond,NP:   Altered taste -Medications reviewed with the in house pharmacist and no meds were identified that would be likely to cause altered taste. In a reviewed of medications on UpToDate Hydrochlorothiazide does have listed Ageusia listed as a possible side effect. She is presumably taking this for hypertension. She will stop the HCTZ for 3-4 weeks and if her symptoms resolve she will stay off. If no change, she can resume it.  -We discussed other possible causes of taste distortion including dental issues and neurologic issues. Also possibly dry mouth related to CPAP. She can use biotene mouth rinse as needed.   04/11/17--I spoke with pt. Pt states stopping HCTZ has not helped her altered taste, she has been to several doctors since stopping HCTZ and has not been able to find a reason for altered taste. Pt is requesting to stop rosuvastatin to see if that will help.  Pt advised to go ahead and stop rosuvastatin, call in 30 days and let us know altered taste improves off rosuvastatin.    Pt advised I will forward to Dr Meda Coffee for review.

## 2017-04-11 NOTE — Telephone Encounter (Signed)
New Message     Pt c/o medication issue:  1. Name of Medication:  rosuvastatin (CRESTOR) 5 MG tablet TAKE 1 TABLET (5 MG TOTAL) BY MOUTH DAILY.     2. How are you currently taking this medication (dosage and times per day)?  1x a day   3. Are you having a reaction (difficulty breathing--STAT)? Yes   4. What is your medication issue?   Since May of this year she has lost the ability to taste, it started with a metallic taste in her mouth and now she has no taste , can she stop taking it until December to see if her sense of taste comes back,  Please advise

## 2017-04-12 NOTE — Telephone Encounter (Signed)
I agree with the plan, Thank you KN

## 2017-04-13 MED FILL — METOPROLOL SUCC ER 25 MG TA: 25 | 90 days supply | Qty: 90 | Fill #2

## 2017-04-13 MED FILL — ROSUVASTATIN CALCIUM 5 MG T: 5 | 30 days supply | Qty: 30 | Fill #2

## 2017-04-13 MED FILL — OMEPRAZOLE DR 40 MG CAPSULE: 40 | 30 days supply | Qty: 30 | Fill #1

## 2017-04-13 NOTE — Telephone Encounter (Signed)
Pt aware Dr Meda Coffee agreed with plan to stop rosuvastatin, call in 30 days to report if altered taste has improved off rosuvastatin.

## 2017-04-19 ENCOUNTER — Ambulatory Visit: Payer: Self-pay | Admitting: Internal Medicine

## 2017-04-19 DIAGNOSIS — H04123 Dry eye syndrome of bilateral lacrimal glands: Secondary | ICD-10-CM | POA: Diagnosis not present

## 2017-04-19 DIAGNOSIS — H2513 Age-related nuclear cataract, bilateral: Secondary | ICD-10-CM | POA: Diagnosis not present

## 2017-04-19 DIAGNOSIS — H25013 Cortical age-related cataract, bilateral: Secondary | ICD-10-CM | POA: Diagnosis not present

## 2017-04-25 ENCOUNTER — Ambulatory Visit (INDEPENDENT_AMBULATORY_CARE_PROVIDER_SITE_OTHER): Payer: Medicare Other | Admitting: Internal Medicine

## 2017-04-25 ENCOUNTER — Encounter: Payer: Self-pay | Admitting: Internal Medicine

## 2017-04-25 ENCOUNTER — Ambulatory Visit (INDEPENDENT_AMBULATORY_CARE_PROVIDER_SITE_OTHER): Payer: Medicare Other | Admitting: Family Medicine

## 2017-04-25 ENCOUNTER — Encounter: Payer: Self-pay | Admitting: Family Medicine

## 2017-04-25 VITALS — BP 114/72 | HR 74 | Ht 62.5 in | Wt 173.0 lb

## 2017-04-25 DIAGNOSIS — Z86010 Personal history of colonic polyps: Secondary | ICD-10-CM

## 2017-04-25 DIAGNOSIS — M25512 Pain in left shoulder: Secondary | ICD-10-CM | POA: Diagnosis not present

## 2017-04-25 DIAGNOSIS — K219 Gastro-esophageal reflux disease without esophagitis: Secondary | ICD-10-CM | POA: Diagnosis not present

## 2017-04-25 DIAGNOSIS — K5730 Diverticulosis of large intestine without perforation or abscess without bleeding: Secondary | ICD-10-CM

## 2017-04-25 DIAGNOSIS — K589 Irritable bowel syndrome without diarrhea: Secondary | ICD-10-CM

## 2017-04-25 MED ORDER — OMEPRAZOLE 40 MG PO CPDR: 40.0000 mg | DELAYED_RELEASE_CAPSULE | Freq: Every day | ORAL | 5 refills | Status: DC

## 2017-04-25 MED ORDER — VSL#3 PO CAPS: ORAL_CAPSULE | ORAL | 5 refills | Status: DC

## 2017-04-25 NOTE — Patient Instructions (Addendum)
We have sent the following medications to your pharmacy for you to pick up at your convenience: Omeprazole 40 mg daily VSL#3 1 capsule daily  Please follow up with Dr Hilarie Fredrickson in 6 months.  If you are age 73 or older, your body mass index should be between 23-30. Your Body mass index is 31.14 kg/m. If this is out of the aforementioned range listed, please consider follow up with your Primary Care Provider.  If you are age 29 or younger, your body mass index should be between 19-25. Your Body mass index is 31.14 kg/m. If this is out of the aformentioned range listed, please consider follow up with your Primary Care Provider.

## 2017-04-25 NOTE — Assessment & Plan Note (Signed)
Patient given repeated injections. Patient does have known arthritic changes of the shoulder and will likely need a replacement at some point. We discussed icing regimen and home exercises. Discussed which activities to do a which ones to avoid. Patient will come back and see me again this continues to help.

## 2017-04-25 NOTE — Progress Notes (Signed)
Subjective:    Patient ID: [name], female    DOB: 11-09-1945, 71 y.o.   MRN: [id number]  HPI Laquitha Heslin is a 71 year old female with a past medical history of adenomatous colon polyps, colonic diverticulitis, GERD, IBS who is here for follow-up. She was last seen on 02/12/2017. She also has history of HLA-B27 positive ankylosing spondylitis, arthritis, hypertension, hyperlipidemia, hypothyroidism, osteoporosis and vitamin D deficiency. She is here alone today.  She reports that overall she is feeling considerably better. She had a CT scan performed to evaluate ongoing left lower quadrant and left-sided abdominal pain after being treated for diverticulitis. This scan was performed after her last visit with me and I have reviewed this test today. We discussed together. This showed diverticulosis without diverticulitis. We started her on VSL 3 which he has been taking daily. This pain has resolved. Bowel movements have been mostly regular. She's having some occasional hemorrhoidal red blood with wiping but no blood in stool or melena. She does occasionally have issues controlling stools and reports this was previously evaluated with anorectal manometry, which she reports was not totally normal. She never took the mesalamine which we had considered for possible segmental colitis associated with diverticulosis.  From a reflux perspective her globus sensation has completely resolved. She is using omeprazole 40 mg daily. No further nausea. Sour belching has resolved. No further metallic taste in her mouth. She has stopped Crestor on advice from cardiology.  She recently increased her vitamin D from 5000 units daily to 10,000 units daily. She did this on her own. Review of Systems As per history of present illness, otherwise negative  Current Medications, Allergies, Past Medical History, Past Surgical History, Family History and Social History were reviewed in [employer]  record.     Objective:   Physical Exam Ht 5' 2.5" (1.588 m)   Wt 173 lb (78.5 kg)   BMI 31.14 kg/m  Constitutional: Well-developed and well-nourished. No distress. HEENT: Normocephalic and atraumatic.Conjunctivae are normal.  No scleral icterus. Neurological: Alert and oriented to person place and time. Skin: Skin is warm and dry. Psychiatric: Normal mood and affect. Behavior is normal.  CBC    Component Value Date/Time   WBC 7.1 01/15/2017 1418   RBC 4.20 01/15/2017 1418   HGB 12.4 01/15/2017 1418   HCT 37.2 01/15/2017 1418   PLT 269.0 01/15/2017 1418   MCV 88.5 01/15/2017 1418   MCH 30.2 10/12/2016 2156   MCHC 33.5 01/15/2017 1418   RDW 13.6 01/15/2017 1418   LYMPHSABS 2.0 01/15/2017 1418   MONOABS 0.6 01/15/2017 1418   EOSABS 0.2 01/15/2017 1418   BASOSABS 0.1 01/15/2017 1418   CMP     Component Value Date/Time   NA 138 01/15/2017 1418   NA 138 12/07/2016 0741   K 4.1 01/15/2017 1418   CL 102 01/15/2017 1418   CO2 30 01/15/2017 1418   GLUCOSE 91 01/15/2017 1418   GLUCOSE 99 07/23/2006 1101   BUN 16 01/15/2017 1418   BUN 20 12/07/2016 0741   CREATININE 1.04 01/15/2017 1418   CALCIUM 10.4 01/15/2017 1418   PROT 7.3 01/15/2017 1418   PROT 7.1 12/07/2016 0741   ALBUMIN 4.3 01/15/2017 1418   ALBUMIN 4.2 12/07/2016 0741   AST 24 01/15/2017 1418   ALT 26 01/15/2017 1418   ALKPHOS 72 01/15/2017 1418   BILITOT 0.3 01/15/2017 1418   BILITOT 0.5 12/07/2016 0741   GFRNONAA 49 (L) 12/07/2016 0741   GFRAA 57 (L)  12/07/2016 0741       Assessment & Plan:  71 year old female with a past medical history of adenomatous colon polyps, colonic diverticulitis, GERD, IBS who is here for follow-up.  1. Left-sided abdominal pain/IBS/history of diverticulitis -- CT scan reviewed which was reassuring. She has improved with VSL 3. No further abdominal pain. Continue probiotic daily, VSL 3.Continue high-fiber diet  2. GERD with globus sensation -- improved with resolution of  symptoms on omeprazole. We'll continue omeprazole 40 mg daily.  3.Internal hemorrhoids -- intermittent bleeding. We discussed banding which she would be a candidate for the future. She wishes to consider this and will let us know if she is interested.  4. History of colon polyps -- history of multiple tubular adenomas. Surveillance colonoscopy is recommended in June 2020  5.  Vitamin D deficiency -- I encouraged her not to increase her oral vitamin D intake without direction from her physician. She will return to 5000 international units daily and I recommended her vitamin D be checked when she follows up with her prescribing physician. Vitamin D is fat-soluble and therefore can be over supplemented.  25 minutes spent with the patient today. Greater than 50% was spent in counseling and coordination of care with the patient 6-12 month ROV

## 2017-04-25 NOTE — Progress Notes (Signed)
[name] [address] [place], [place] [address] Phone: [phone] Subjective:    I'm seeing this patient by the request  of:    CC:   [id number]  [name] is a 71 y.o. female coming in for follow up for left shoulder pain. She did have some relief from the injection but it did wear off. These injections were triggerpoints last time. She said that it has been hard to sleep on the left side. She said that her ankle is also bothering her. The pain has been coming and going in her ankle. She is wearing an aircast for support today.      Past Medical History:  Diagnosis Date  . Allergy    seasonal  . Ankylosing spondylitis ([place])   . Anxiety   . Arthritis   . Bell palsy   . CTS (carpal tunnel syndrome)   . Cystitis   . Diverticulitis   . Diverticulosis of colon   . Dry eye syndrome   . Frozen shoulder   . GERD (gastroesophageal reflux disease)   . HLA B27 (HLA B27 positive)   . Hx of colonic polyps   . Hyperlipidemia    NMR 2005  . Hypertension   . Hypothyroidism   . Incontinence   . Internal hemorrhoids   . Interstitial cystitis   . Kidney stone   . Menopause   . Osteoarthritis   . Osteopenia    [facility]  . S/P total hysterectomy and bilateral salpingo-oophorectomy 04/12/2014  . Sleep apnea   . Subjective visual disturbance of both eyes 12/30/2013  . Tubular adenoma of colon   . Vitamin D deficiency   . Xerostomia    and Xeroophthalmia   Past Surgical History:  Procedure Laterality Date  . ABDOMINAL HYSTERECTOMY    . BLADDER SURGERY     for incontinence  . CARPAL TUNNEL RELEASE Left 12/22/2014   Procedure: LEFT CARPAL TUNNEL RELEASE;  Surgeon: Daryll Brod, MD;  Location: [facility];  Service: Orthopedics;  Laterality: Left;  . COLONOSCOPY W/ POLYPECTOMY     adenomatous poyp 04-2008  . colonscopy     tics 10-2002  . CYSTOSCOPY WITH RETROGRADE PYELOGRAM, URETEROSCOPY AND STENT  PLACEMENT Left 09/01/2013   Procedure: CYSTOSCOPY WITH RETROGRADE PYELOGRAM, AND LEFT STENT PLACEMENT;  Surgeon: Molli Hazard, MD;  Location: [facility];  Service: Urology;  Laterality: Left;  . elevated LFTs     due to Elmiron  . FINGER SURGERY Left 2004   2nd finger  . G 1 P 1    . interstitial cystitis     with clot in catheter  . SHOULDER ARTHROSCOPY Left   . TOTAL ABDOMINAL HYSTERECTOMY W/ BILATERAL SALPINGOOPHORECTOMY     dysfunctional menses   Social History   Social History  . Marital status: Married    Spouse name: N/A  . Number of children: 1  . Years of education: BS   Occupational History  . retired   . teacher     Kindergarten   Social History Main Topics  . Smoking status: Never Smoker  . Smokeless tobacco: Never Used  . Alcohol use Yes     Comment: occ  . Drug use: No  . Sexual activity: Not Currently   Other Topics Concern  . None   Social History Narrative   Lives   Caffeine use:    Married  Allergies  Allergen Reactions  . Nitroglycerin Anaphylaxis    Hypotensive after NTG administration in ER during chest pain evaluation  . Pentosan Polysulfate Sodium     Elevated LFTs.......Marland Kitchen elmiron   . Bactrim [Sulfamethoxazole-Trimethoprim]   . Promethazine Hcl     "jittery on the inside"  . Lisinopril Cough  . Sulfamethoxazole-Trimethoprim Nausea Only    Unknown reaction per pt   Family History  Problem Relation Age of Onset  . Coronary artery disease Mother   . Hypertension Mother   . Asthma Mother   . Osteoporosis Mother   . Dementia Mother   . Stroke Mother        mini cva  . Diabetes Father   . Cirrhosis Father        non alcoholic  . Cancer Sister        colon  . Colon cancer Sister 50  . Cancer Paternal Aunt        2 aunts had renal cancer  . Coronary artery disease Maternal Grandfather   . Cancer Maternal Aunt        breast  . Diabetes Paternal Aunt   . Diabetes Paternal Grandmother      Past medical history,  social, surgical and family history all reviewed in electronic medical record.  No pertanent information unless stated regarding to the chief complaint.   Review of Systems:Review of systems updated and as accurate as of 04/25/17  No headache, visual changes, nausea, vomiting, diarrhea, constipation, dizziness, abdominal pain, skin rash, fevers, chills, night sweats, weight loss, swollen lymph nodes, body aches, joint swelling, muscle aches, chest pain, shortness of breath, mood changes.   Objective  Blood pressure 110/74, pulse 66, height 5' 2.5" (1.588 m), weight 172 lb (78 kg), SpO2 98 %. Systems examined below as of 04/25/17   General: No apparent distress alert and oriented x3 mood and affect normal, dressed appropriately.  HEENT: Pupils equal, extraocular movements intact  Respiratory: Patient's speak in full sentences and does not appear short of breath  Cardiovascular: No lower extremity edema, non tender, no erythema  Skin: Warm dry intact with no signs of infection or rash on extremities or on axial skeleton.  Abdomen: Soft nontender  Neuro: Cranial nerves II through XII are intact, neurovascularly intact in all extremities with 2+ DTRs and 2+ pulses.  Lymph: No lymphadenopathy of posterior or anterior cervical chain or axillae bilaterally.  Gait normal with good balance and coordination.  MSK:  Mild tender with full range of motion and good stability and symmetric strength and tone of  elbows, wrist, hip, knee and ankles bilaterally. Arthritic changes of multiple joints  Left Shoulder still has significant crepitus. Patient does have weakness with 3 out of 5 strength compared to the contralateral side. Significant trigger point still noted in the left trapezius.   After verbal consent patient was prepped with alcohol swab and with a 25-gauge half-inch she was injected in 4 distinct her points in the left trapezius with a total of 3 mL of 0.5% Marcaine and 1 mL of Kenalog 40 mg/dL. No  blood loss. Band-Aid placed. Post injection instructions given.  Impression and Recommendations:     This case required medical decision making of moderate complexity.      Note: This dictation was prepared with Dragon dictation along with smaller phrase technology. Any transcriptional errors that result from this process are unintentional.

## 2017-04-25 NOTE — Patient Instructions (Signed)
Good to see you  I am sorry I don't have a good answer.  Keep trucking along.  The ankle you are doing everything right.  See me again when you need me.

## 2017-05-02 ENCOUNTER — Other Ambulatory Visit (INDEPENDENT_AMBULATORY_CARE_PROVIDER_SITE_OTHER): Payer: Medicare Other

## 2017-05-02 ENCOUNTER — Encounter: Payer: Self-pay | Admitting: Internal Medicine

## 2017-05-02 ENCOUNTER — Ambulatory Visit (INDEPENDENT_AMBULATORY_CARE_PROVIDER_SITE_OTHER): Payer: Medicare Other | Admitting: Internal Medicine

## 2017-05-02 VITALS — BP 152/84 | HR 64 | Temp 97.8°F | Resp 16 | Wt 171.0 lb

## 2017-05-02 DIAGNOSIS — N1831 Chronic kidney disease, stage 3a: Secondary | ICD-10-CM | POA: Insufficient documentation

## 2017-05-02 DIAGNOSIS — Z23 Encounter for immunization: Secondary | ICD-10-CM

## 2017-05-02 DIAGNOSIS — E119 Type 2 diabetes mellitus without complications: Secondary | ICD-10-CM | POA: Insufficient documentation

## 2017-05-02 DIAGNOSIS — R7303 Prediabetes: Secondary | ICD-10-CM | POA: Diagnosis not present

## 2017-05-02 DIAGNOSIS — R252 Cramp and spasm: Secondary | ICD-10-CM | POA: Diagnosis not present

## 2017-05-02 DIAGNOSIS — I10 Essential (primary) hypertension: Secondary | ICD-10-CM

## 2017-05-02 DIAGNOSIS — E782 Mixed hyperlipidemia: Secondary | ICD-10-CM | POA: Diagnosis not present

## 2017-05-02 DIAGNOSIS — R439 Unspecified disturbances of smell and taste: Secondary | ICD-10-CM | POA: Diagnosis not present

## 2017-05-02 DIAGNOSIS — M810 Age-related osteoporosis without current pathological fracture: Secondary | ICD-10-CM

## 2017-05-02 LAB — LIPID PANEL
CHOL/HDL RATIO: 3
Cholesterol: 227 mg/dL — ABNORMAL HIGH (ref 0–200)
HDL: 65.7 mg/dL (ref >39.00)
LDL CALC: 129 mg/dL — AB (ref 0–99)
NONHDL: 161.02
TRIGLYCERIDES: 160 mg/dL — AB (ref 0.0–149.0)
VLDL: 32 mg/dL (ref 0.0–40.0)

## 2017-05-02 LAB — COMPREHENSIVE METABOLIC PANEL
ALK PHOS: 76 U/L (ref 39–117)
ALT: 30 U/L (ref 0–35)
AST: 22 U/L (ref 0–37)
Albumin: 4.4 g/dL (ref 3.5–5.2)
BUN: 20 mg/dL (ref 6–23)
CHLORIDE: 102 meq/L (ref 96–112)
CO2: 29 meq/L (ref 19–32)
Calcium: 10 mg/dL (ref 8.4–10.5)
Creatinine, Ser: 1.22 mg/dL — ABNORMAL HIGH (ref 0.40–1.20)
GFR: 45.89 mL/min — AB (ref >60.00)
Glucose, Bld: 105 mg/dL — ABNORMAL HIGH (ref 70–99)
POTASSIUM: 4.1 meq/L (ref 3.5–5.1)
Sodium: 140 mEq/L (ref 135–145)
TOTAL PROTEIN: 7.8 g/dL (ref 6.0–8.3)
Total Bilirubin: 0.5 mg/dL (ref 0.2–1.2)

## 2017-05-02 LAB — HEMOGLOBIN A1C: Hgb A1c MFr Bld: 6.5 % (ref 4.6–6.5)

## 2017-05-02 LAB — MAGNESIUM: Magnesium: 2.1 mg/dL (ref 1.5–2.5)

## 2017-05-02 NOTE — Assessment & Plan Note (Signed)
Taking forteo - having some side effects - muscle cramping, foggy thinking May stop medication - best alternative is prolia

## 2017-05-02 NOTE — Assessment & Plan Note (Signed)
Taste is improving off of Crestor

## 2017-05-02 NOTE — Assessment & Plan Note (Signed)
Check a1c Low sugar / carb diet Stressed regular exercise   

## 2017-05-02 NOTE — Progress Notes (Signed)
Subjective:    Patient ID: [name], female    DOB: [birth year], 71 y.o.   MRN: [id number]  HPI The patient is here for follow up.  Hypertension: She is taking her medication daily. She is compliant with a low sodium diet.  She denies chest pain, palpitations, edema, shortness of breath and regular headaches. She is not exercising regularly.  She does not monitor her blood pressure at home.    Prediabetes:  She is compliant with a low sugar/carbohydrate diet.  She is not exercising regularly.  Hyperlipidemia: She is taking her medication daily. She is compliant with a low fat/cholesterol diet. She is not exercising regularly. She denies myalgias.   Osteoporosis:  She is taking the forteo.  She is having muscle aches in her legs.  She has noticed some foggy thinking after the injection.  She is not exercising regularly.    Decreased taste:  She stopped the crestor and her taste has improved.  Cardiology was ok with her being off of it for one month to see if it improved.    Medications and allergies reviewed with patient and updated if appropriate.  Patient Active Problem List   Diagnosis Date Noted  . Trigger point of left shoulder region 03/27/2017  . Taste disorder 02/22/2017  . Anxiety 02/12/2017  . Rash and nonspecific skin eruption 02/12/2017  . Fatigue 01/15/2017  . Decreased sense of taste 01/15/2017  . Metallic taste 08/81/1031  . Atherosclerotic plaque 11/07/2016  . Chest pain 09/21/2016  . 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 Renal insufficiency 06/29/2016  . RUQ discomfort 06/29/2016  . Osteoporosis 06/21/2016  . Dizziness 05/16/2016  . Arthritis of left shoulder region 01/17/2016  . Ankylosing spondylitis ([place]) 11/08/2015  . Incontinence 10/28/2015  . Thoracic back pain 10/19/2015  . Leg cramps 09/29/2015  . Prediabetes 09/29/2015  . 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 Impingement syndrome of  left shoulder region 09/22/2014  . Interstitial cystitis 07/09/2014  . Diverticulosis of colon without hemorrhage 07/08/2014  . Carpal tunnel syndrome of left wrist 04/12/2014  . Food allergy 03/06/2014  . Renal calculi 01/30/2014  . OSA (obstructive sleep apnea), moderate 01/14/2014  . Visual changes 12/17/2013  . Seasonal and perennial allergic rhinitis 12/06/2013  . Seasonal allergic conjunctivitis 12/06/2013  . History of Bell's palsy 02/26/2012  . Vitamin D deficiency 06/27/2010  . 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  . DRY MOUTH 06/27/2010  . ABDOMINAL PAIN, SUPRAPUBIC 05/25/2009  . GERD 04/30/2009  . DIVERTICULOSIS, COLON 04/30/2009  . History of colonic polyps 04/30/2009  . COSTOCHONDRITIS 01/15/2008  . HYPERLIPIDEMIA 08/21/2007  . Essential hypertension 08/21/2007  . ARTHRALGIA 08/21/2007  . Chronic lumbar radiculopathy 01/25/2007    Current Outpatient Prescriptions on File Prior to Visit  Medication Sig Dispense Refill  . acetaminophen (TYLENOL) 500 MG tablet Take 500 mg by mouth every 4 (four) hours as needed for mild pain.     . Ascorbic Acid (VITAMIN C) 1000 MG tablet Take 1,000 mg by mouth daily.     Marland Kitchen aspirin 81 MG tablet Take 81 mg by mouth daily.      . blood glucose meter kit and supplies KIT Dispense based on patient and insurance preference. Use up to four times daily as directed. prediabetes 1 each 0  . Calcium Carbonate-Vit D-Min (CALCIUM 1200 PO) Take 600 mg by mouth daily.     . cetirizine (ZYRTEC) 10 MG tablet Take 10 mg  by mouth daily.      . Cholecalciferol (VITAMIN D3) 5000 units TABS Take 5,000 mg by mouth daily.    . Diclofenac Sodium 2 % SOLN Place onto the skin as directed for pain as needed.    . gabapentin (NEURONTIN) 100 MG capsule Take 2 capsules (200 mg total) by mouth at bedtime. 60 capsule 3  . hyoscyamine (LEVSIN SL) 0.125 MG SL tablet Place 1 tablet (0.125 mg total) under the tongue every 4 (four) hours as needed. 15 tablet 1  . MAGNESIUM PO Take  1 capsule by mouth daily. With vitamin B6    . meloxicam (MOBIC) 15 MG tablet Take 1 tablet (15 mg total) by mouth daily as needed for pain. With food 90 tablet 2  . metoprolol succinate (TOPROL XL) 25 MG 24 hr tablet Take 1 tablet (25 mg total) by mouth daily. 90 tablet 3  . Misc Natural Products (COSAMIN ASU ADVANCED FORMULA PO) Take 2 each by mouth daily.    . Omega-3 Fatty Acids (FISH OIL BURP-LESS) 1000 MG CAPS Takes 1000 mg BID 60 capsule 0  . omeprazole (PRILOSEC) 40 MG capsule Take 1 capsule (40 mg total) by mouth daily. 30 capsule 5  . Polyethyl Glycol-Propyl Glycol (SYSTANE OP) Apply 1 drop to eye as needed. Apply to both eyes as needed    . Probiotic Product (VSL#3) CAPS Take 1 capsule by mouth once daily 30 capsule 5  . Teriparatide, Recombinant, (FORTEO [place]) Inject into the skin.    Marland Kitchen vitamin B-12 (CYANOCOBALAMIN) 1000 MCG tablet Take 1,000 mcg by mouth daily.     . vitamin E 400 UNIT capsule Take 400 Units by mouth daily.     No current facility-administered medications on file prior to visit.     Past Medical History:  Diagnosis Date  . Allergy    seasonal  . Ankylosing spondylitis ([place])   . Anxiety   . Arthritis   . Bell palsy   . CTS (carpal tunnel syndrome)   . Cystitis   . Diverticulitis   . Diverticulosis of colon   . Dry eye syndrome   . Frozen shoulder   . GERD (gastroesophageal reflux disease)   . HLA B27 (HLA B27 positive)   . Hx of colonic polyps   . Hyperlipidemia    NMR 2005  . Hypertension   . Hypothyroidism   . Incontinence   . Internal hemorrhoids   . Interstitial cystitis   . Kidney stone   . Menopause   . Osteoarthritis   . Osteopenia    [facility]MD done [facility]  . S/P total hysterectomy and bilateral salpingo-oophorectomy 04/12/2014  . Sleep apnea   . Subjective visual disturbance of both eyes 12/30/2013  . Tubular adenoma of colon   . Vitamin D deficiency   . Xerostomia    and Xeroophthalmia    Past Surgical History:    Procedure Laterality Date  . ABDOMINAL HYSTERECTOMY    . BLADDER SURGERY     for incontinence  . CARPAL TUNNEL RELEASE Left 12/22/2014   Procedure: LEFT CARPAL TUNNEL RELEASE;  Surgeon: Daryll Brod, MD;  Location: [facility];  Service: Orthopedics;  Laterality: Left;  . COLONOSCOPY W/ POLYPECTOMY     adenomatous poyp 04-2008  . colonscopy     tics 10-2002  . CYSTOSCOPY WITH RETROGRADE PYELOGRAM, URETEROSCOPY AND STENT PLACEMENT Left 09/01/2013   Procedure: CYSTOSCOPY WITH RETROGRADE PYELOGRAM, AND LEFT STENT PLACEMENT;  Surgeon: Molli Hazard, MD;  Location: [facility];  Service: Urology;  Laterality: Left;  . elevated LFTs     due to Elmiron  . FINGER SURGERY Left 2004   2nd finger  . G 1 P 1    . interstitial cystitis     with clot in catheter  . SHOULDER ARTHROSCOPY Left   . TOTAL ABDOMINAL HYSTERECTOMY W/ BILATERAL SALPINGOOPHORECTOMY     dysfunctional menses    Social History   Social History  . Marital status: Married    Spouse name: N/A  . Number of children: 1  . Years of education: BS   Occupational History  . retired   . teacher     Kindergarten   Social History Main Topics  . Smoking status: Never Smoker  . Smokeless tobacco: Never Used  . Alcohol use Yes     Comment: occ  . Drug use: No  . Sexual activity: Not Currently   Other Topics Concern  . None   Social History Narrative   Lives   Caffeine use:    Married          Family History  Problem Relation Age of Onset  . Coronary artery disease Mother   . Hypertension Mother   . Asthma Mother   . Osteoporosis Mother   . Dementia Mother   . Stroke Mother        mini cva  . Diabetes Father   . Cirrhosis Father        non alcoholic  . Cancer Sister        colon  . Colon cancer Sister 39  . Cancer Paternal Aunt        2 aunts had renal cancer  . Coronary artery disease Maternal Grandfather   . Cancer Maternal Aunt        breast  . Diabetes Paternal Aunt   . Diabetes  Paternal Grandmother     Review of Systems  Constitutional: Negative for chills and fever.  Respiratory: Positive for cough. Negative for shortness of breath and wheezing.   Cardiovascular: Negative for chest pain (sharp pain on right side last night lasted 10-15 sec - occurred twice), palpitations and leg swelling.  Musculoskeletal: Positive for myalgias (in legs - ache/cramping at night - ? forteo).  Neurological: Positive for light-headedness (occ) and headaches (rarely).       Objective:   Vitals:   05/02/17 0828  BP: (!) 152/84  Pulse: 64  Resp: 16  Temp: 97.8 F (36.6 C)  SpO2: 98%   Wt Readings from Last 3 Encounters:  05/02/17 171 lb (77.6 kg)  04/25/17 172 lb (78 kg)  04/25/17 173 lb (78.5 kg)   Body mass index is 30.78 kg/m.   Physical Exam    Constitutional: Appears well-developed and well-nourished. No distress.  HENT:  Head: Normocephalic and atraumatic.  Neck: Neck supple. No tracheal deviation present. No thyromegaly present.  No cervical lymphadenopathy Cardiovascular: Normal rate, regular rhythm and normal heart sounds.   No murmur heard. No carotid bruit .  No edema Pulmonary/Chest: Effort normal and breath sounds normal. No respiratory distress. No has no wheezes. No rales.  Skin: Skin is warm and dry. Not diaphoretic.  Psychiatric: Normal mood and affect. Behavior is normal.      Assessment & Plan:    See Problem List for Assessment and Plan of chronic medical problems.

## 2017-05-02 NOTE — Assessment & Plan Note (Signed)
Bp at home has been well controlled  BP Readings from Last 3 Encounters:  05/02/17 (!) 152/84  04/25/17 110/74  04/25/17 114/72   BP well controlled Current regimen effective and well tolerated Continue current medications at current doses cmp

## 2017-05-02 NOTE — Assessment & Plan Note (Signed)
Check lipid panel  Continue daily statin Regular exercise and healthy diet encouraged  

## 2017-05-02 NOTE — Assessment & Plan Note (Signed)
Experiencing leg cramping-possibly related to Forteo Will check CMP, magnesium Discussed natural remedies Increase water intake Start regular exercise

## 2017-05-02 NOTE — Patient Instructions (Addendum)
  Test(s) ordered today. Your results will be released to MyChart (or called to you) after review, usually within 72hours after test completion. If any changes need to be made, you will be notified at that same time.  All other Health Maintenance issues reviewed.   All recommended immunizations and age-appropriate screenings are up-to-date or discussed.  Flu immunization administered today.   Medications reviewed and updated.  No changes recommended at this time.   Please followup in 6 months    

## 2017-05-07 MED FILL — OMEPRAZOLE DR 40 MG CAPSULE: 40 | 30 days supply | Qty: 30 | Fill #0

## 2017-05-11 ENCOUNTER — Telehealth: Payer: Self-pay | Admitting: Cardiology

## 2017-05-11 MED ORDER — ROSUVASTATIN CALCIUM 5 MG PO TABS: 5.0000 mg | ORAL_TABLET | Freq: Every day | ORAL | 2 refills | Status: DC

## 2017-05-11 NOTE — Telephone Encounter (Signed)
Pt is calling back, as instructed by Dr Meda Coffee a month ago, to report if her "altered taste sensation" from crestor improved.  Pt states that Dr Meda Coffee advised her to stop crestor for 30 days and call the office back to report if her ability to taste improved or not.  Per the pt, she states it actually made no difference at all.  Pt states that it's not  crestor causing this issue. Pt also wanted to let Dr Meda Coffee know she had her labs checked about 9 days ago by Dr Quay Burow, and she can visualize them in her chart, to better help her advise on crestor dosing.  Pt had a full panel of labs done.  Pt would like to go back on a statin. Informed the pt that Dr Meda Coffee is out of the office today, but I will route this message to her for further review and recommendation, and follow-up with the pt shortly thereafter.  Pt verbalized understanding and agrees with this plan.

## 2017-05-11 NOTE — Telephone Encounter (Signed)
New Message  Pt c/o medication issue:  1. Name of Medication: Crestor   2. How are you currently taking this medication (dosage and times per day)?5mg   3. Are you having a reaction (difficulty breathing--STAT)? No   4. What is your medication issue? Per pt would like to know if she needs to resume taking this medication.  Pt would also like to know if she can get orders for lab work put in today and complete them today. Please call back to discuss

## 2017-05-11 NOTE — Telephone Encounter (Signed)
Rosuvastatin 5 mg po daily

## 2017-05-11 NOTE — Telephone Encounter (Signed)
Notified the pt that per Dr Meda Coffee, we will restart her rosuvastatin 5 mg po daily.  Confirmed the pharmacy of choice.  Placed a note to the pharmacy to not fill this med until pt calls for further refills.  Pt verbalized understanding and agrees with this plan.

## 2017-05-20 ENCOUNTER — Encounter (HOSPITAL_BASED_OUTPATIENT_CLINIC_OR_DEPARTMENT_OTHER): Payer: Self-pay | Admitting: Emergency Medicine

## 2017-05-20 ENCOUNTER — Emergency Department (HOSPITAL_BASED_OUTPATIENT_CLINIC_OR_DEPARTMENT_OTHER)
Admission: EM | Admit: 2017-05-20 | Discharge: 2017-05-21 | Disposition: A | Discharge status: Home / Self Care | Payer: Medicare Other | Attending: Emergency Medicine | Admitting: Emergency Medicine

## 2017-05-20 ENCOUNTER — Emergency Department (HOSPITAL_BASED_OUTPATIENT_CLINIC_OR_DEPARTMENT_OTHER): Payer: Medicare Other

## 2017-05-20 DIAGNOSIS — M79605 Pain in left leg: Secondary | ICD-10-CM | POA: Insufficient documentation

## 2017-05-20 DIAGNOSIS — I10 Essential (primary) hypertension: Secondary | ICD-10-CM | POA: Diagnosis not present

## 2017-05-20 DIAGNOSIS — Z7982 Long term (current) use of aspirin: Secondary | ICD-10-CM | POA: Diagnosis not present

## 2017-05-20 DIAGNOSIS — R0789 Other chest pain: Secondary | ICD-10-CM | POA: Diagnosis not present

## 2017-05-20 DIAGNOSIS — R110 Nausea: Secondary | ICD-10-CM | POA: Diagnosis not present

## 2017-05-20 DIAGNOSIS — E119 Type 2 diabetes mellitus without complications: Secondary | ICD-10-CM | POA: Insufficient documentation

## 2017-05-20 DIAGNOSIS — Z79899 Other long term (current) drug therapy: Secondary | ICD-10-CM | POA: Insufficient documentation

## 2017-05-20 DIAGNOSIS — K5730 Diverticulosis of large intestine without perforation or abscess without bleeding: Secondary | ICD-10-CM | POA: Diagnosis not present

## 2017-05-20 DIAGNOSIS — E039 Hypothyroidism, unspecified: Secondary | ICD-10-CM | POA: Diagnosis not present

## 2017-05-20 DIAGNOSIS — M79662 Pain in left lower leg: Secondary | ICD-10-CM | POA: Diagnosis not present

## 2017-05-20 DIAGNOSIS — R0989 Other specified symptoms and signs involving the circulatory and respiratory systems: Secondary | ICD-10-CM | POA: Diagnosis not present

## 2017-05-20 LAB — BASIC METABOLIC PANEL
ANION GAP: 8 (ref 5–15)
BUN: 23 mg/dL — ABNORMAL HIGH (ref 6–20)
CALCIUM: 9.8 mg/dL (ref 8.9–10.3)
CHLORIDE: 106 mmol/L (ref 101–111)
CO2: 24 mmol/L (ref 22–32)
Creatinine, Ser: 1.27 mg/dL — ABNORMAL HIGH (ref 0.44–1.00)
GFR calc non Af Amer: 41 mL/min — ABNORMAL LOW (ref >60)
GFR, EST AFRICAN AMERICAN: 47 mL/min — AB (ref >60)
GLUCOSE: 117 mg/dL — AB (ref 65–99)
Potassium: 3.8 mmol/L (ref 3.5–5.1)
Sodium: 138 mmol/L (ref 135–145)

## 2017-05-20 LAB — CBC WITH DIFFERENTIAL/PLATELET
BASOS PCT: 0 %
Basophils Absolute: 0 10*3/uL (ref 0.0–0.1)
Eosinophils Absolute: 0.1 10*3/uL (ref 0.0–0.7)
Eosinophils Relative: 1 %
HEMATOCRIT: 34.6 % — AB (ref 36.0–46.0)
HEMOGLOBIN: 11.7 g/dL — AB (ref 12.0–15.0)
LYMPHS ABS: 1.7 10*3/uL (ref 0.7–4.0)
LYMPHS PCT: 18 %
MCH: 29.7 pg (ref 26.0–34.0)
MCHC: 33.8 g/dL (ref 30.0–36.0)
MCV: 87.8 fL (ref 78.0–100.0)
MONO ABS: 0.8 10*3/uL (ref 0.1–1.0)
MONOS PCT: 8 %
NEUTROS ABS: 6.9 10*3/uL (ref 1.7–7.7)
NEUTROS PCT: 73 %
Platelets: 236 10*3/uL (ref 150–400)
RBC: 3.94 MIL/uL (ref 3.87–5.11)
RDW: 12.9 % (ref 11.5–15.5)
WBC: 9.5 10*3/uL (ref 4.0–10.5)

## 2017-05-20 LAB — TROPONIN I: Troponin I: <0.03 ng/mL (ref <0.03)

## 2017-05-20 MED ORDER — FENTANYL CITRATE (PF) 100 MCG/2ML IJ SOLN
50.0000 ug | Freq: Once | INTRAMUSCULAR | Status: AC
  Administered 2017-05-20: 50 ug via INTRAVENOUS
  Filled 2017-05-20: qty 2

## 2017-05-20 MED ORDER — SODIUM CHLORIDE 0.9 % IV BOLUS (SEPSIS)
1000.0000 mL | Freq: Once | INTRAVENOUS | Status: AC
  Administered 2017-05-20: 1000 mL via INTRAVENOUS

## 2017-05-20 MED ORDER — HYDROMORPHONE HCL 1 MG/ML IJ SOLN
1.0000 mg | Freq: Once | INTRAMUSCULAR | Status: AC
  Administered 2017-05-20: 1 mg via INTRAVENOUS
  Filled 2017-05-20: qty 1

## 2017-05-20 MED ORDER — ONDANSETRON HCL 4 MG/2ML IJ SOLN
4.0000 mg | Freq: Once | INTRAMUSCULAR | Status: AC
  Administered 2017-05-20: 4 mg via INTRAVENOUS
  Filled 2017-05-20: qty 2

## 2017-05-20 MED ORDER — IOPAMIDOL (ISOVUE-370) INJECTION 76%
100.0000 mL | Freq: Once | INTRAVENOUS | Status: AC | PRN
  Administered 2017-05-20: 100 mL via INTRAVENOUS

## 2017-05-20 NOTE — ED Notes (Signed)
Pain increased, IVF bolus complete, rigors present, "shaking, but not cold". VSS. EDP notified.

## 2017-05-20 NOTE — ED Provider Notes (Signed)
[facility] Provider Note  CSN: [id number] Arrival date & time: 05/20/17 2158  Chief Complaint(s) Leg Pain and Hypertension  HPI [name] is a 71 y.o. female with an extensive past medical history listed below who presents to the emergency department with sudden onset of left lower extremity pain that began 3-1/2 hours prior to arrival.  Patient reports that she was at her normal state of health, until the pain started.  She had been walking up the stairs several times today but reports once the pain started she was unable to move her leg due to the pain.  Pain is exacerbated with movement of the leg and palpation of the leg.  She is also endorsing generalized discomfort, stating "I just do not feel well."  She describes a sensation as a floating  and had spinning sensation.  She denies any overt chest pain, shortness of breath, nausea, lightheadedness, visual changes, focal weakness.  No other alleviating or aggravating factors.  She denies any other physical complaints.  HPI  Past Medical History Past Medical History:  Diagnosis Date  . Allergy    seasonal  . Ankylosing spondylitis ([place])   . Anxiety   . Arthritis   . Bell palsy   . CTS (carpal tunnel syndrome)   . Cystitis   . Diverticulitis   . Diverticulosis of colon   . Dry eye syndrome   . Frozen shoulder   . GERD (gastroesophageal reflux disease)   . HLA B27 (HLA B27 positive)   . Hx of colonic polyps   . Hyperlipidemia    NMR 2005  . Hypertension   . Hypothyroidism   . Incontinence   . Internal hemorrhoids   . Interstitial cystitis   . Kidney stone   . Menopause   . Osteoarthritis   . Osteopenia    [facility]  . S/P total hysterectomy and bilateral salpingo-oophorectomy 04/12/2014  . Sleep apnea   . Subjective visual disturbance of both eyes 12/30/2013  . Tubular adenoma of colon   . Vitamin D deficiency   . Xerostomia    and Xeroophthalmia   Patient  Active Problem List   Diagnosis Date Noted  . Prediabetes 05/02/2017  . Diabetes ([place]) 05/02/2017  . Trigger point of left shoulder region 03/27/2017  . Taste disorder 02/22/2017  . Anxiety 02/12/2017  . Rash and nonspecific skin eruption 02/12/2017  . Fatigue 01/15/2017  . Decreased sense of taste 01/15/2017  . Metallic taste 76/73/4193  . Atherosclerotic plaque 11/07/2016  . Chest pain 09/21/2016  . 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 Renal insufficiency 06/29/2016  . RUQ discomfort 06/29/2016  . Osteoporosis 06/21/2016  . Dizziness 05/16/2016  . Arthritis of left shoulder region 01/17/2016  . Ankylosing spondylitis ([place]) 11/08/2015  . Incontinence 10/28/2015  . Thoracic back pain 10/19/2015  . Leg cramping 09/29/2015  . 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 Impingement syndrome of left shoulder region 09/22/2014  . Interstitial cystitis 07/09/2014  . Diverticulosis of colon without hemorrhage 07/08/2014  . Carpal tunnel syndrome of left wrist 04/12/2014  . Food allergy 03/06/2014  . Renal calculi 01/30/2014  . OSA (obstructive sleep apnea), moderate 01/14/2014  . Visual changes 12/17/2013  . Seasonal and perennial allergic rhinitis 12/06/2013  . Seasonal allergic conjunctivitis 12/06/2013  . History of Bell's palsy 02/26/2012  . Vitamin D deficiency 06/27/2010  . 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  . DRY  MOUTH 06/27/2010  . ABDOMINAL PAIN, SUPRAPUBIC 05/25/2009  . GERD 04/30/2009  . DIVERTICULOSIS, COLON 04/30/2009  . History of colonic polyps 04/30/2009  . COSTOCHONDRITIS 01/15/2008  . HYPERLIPIDEMIA 08/21/2007  . Essential hypertension 08/21/2007  . ARTHRALGIA 08/21/2007  . Chronic lumbar radiculopathy 01/25/2007   Home Medication(s) Prior to Admission medications   Medication Sig Start Date End Date Taking? Authorizing Provider  acetaminophen (TYLENOL) 500 MG tablet Take 500 mg by mouth every 4 (four) hours  as needed for mild pain.     [provider]  Ascorbic Acid (VITAMIN C) 1000 MG tablet Take 1,000 mg by mouth daily.     [provider]  aspirin 81 MG tablet Take 81 mg by mouth daily.      [provider]  blood glucose meter kit and supplies KIT Dispense based on patient and insurance preference. Use up to four times daily as directed. prediabetes 10/28/15   Binnie Rail, MD  Calcium Carbonate-Vit D-Min (CALCIUM 1200 PO) Take 600 mg by mouth daily.     [provider]  cetirizine (ZYRTEC) 10 MG tablet Take 10 mg by mouth daily.      [provider]  Cholecalciferol (VITAMIN D3) 5000 units TABS Take 5,000 mg by mouth daily.    [provider]  Diclofenac Sodium 2 % SOLN Place onto the skin as directed for pain as needed.    [provider]  gabapentin (NEURONTIN) 100 MG capsule Take 2 capsules (200 mg total) by mouth at bedtime. 03/27/17   Lyndal Pulley, DO  hyoscyamine (LEVSIN SL) 0.125 MG SL tablet Place 1 tablet (0.125 mg total) under the tongue every 4 (four) hours as needed. 12/13/16   Levin Erp, PA  MAGNESIUM PO Take 1 capsule by mouth daily. With vitamin B6    [provider]  meloxicam (MOBIC) 15 MG tablet Take 1 tablet (15 mg total) by mouth daily as needed for pain. With food 09/11/16   Nche, Charlene Brooke, NP  metoprolol succinate (TOPROL XL) 25 MG 24 hr tablet Take 1 tablet (25 mg total) by mouth daily. 10/17/16   Dorothy Spark, MD  Misc Natural Products (COSAMIN ASU ADVANCED FORMULA PO) Take 2 each by mouth daily.    [provider]  Omega-3 Fatty Acids (FISH OIL BURP-LESS) 1000 MG CAPS Takes 1000 mg BID 01/03/16   Esterwood, Amy S, PA-C  omeprazole (PRILOSEC) 40 MG capsule Take 1 capsule (40 mg total) by mouth daily. 04/25/17   Pyrtle, Lajuan Lines, MD  Polyethyl Glycol-Propyl Glycol (SYSTANE OP) Apply 1 drop to eye as needed. Apply to both eyes as needed    [provider]  Probiotic  Product (VSL#3) CAPS Take 1 capsule by mouth once daily 04/25/17   Pyrtle, Lajuan Lines, MD  rosuvastatin (CRESTOR) 5 MG tablet Take 1 tablet (5 mg total) by mouth daily. 05/11/17 08/09/17  Dorothy Spark, MD  Teriparatide, Recombinant, (FORTEO [place]) Inject into the skin.    [provider]  vitamin B-12 (CYANOCOBALAMIN) 1000 MCG tablet Take 1,000 mcg by mouth daily.     [provider]  vitamin E 400 UNIT capsule Take 400 Units by mouth daily.    [provider]  Past Surgical History Past Surgical History:  Procedure Laterality Date  . ABDOMINAL HYSTERECTOMY    . BLADDER SURGERY     for incontinence  . CARPAL TUNNEL RELEASE Left 12/22/2014   Procedure: LEFT CARPAL TUNNEL RELEASE;  Surgeon: Daryll Brod, MD;  Location: [place];  Service: Orthopedics;  Laterality: Left;  . COLONOSCOPY W/ POLYPECTOMY     adenomatous poyp 04-2008  . colonscopy     tics 10-2002  . CYSTOSCOPY WITH RETROGRADE PYELOGRAM, URETEROSCOPY AND STENT PLACEMENT Left 09/01/2013   Procedure: CYSTOSCOPY WITH RETROGRADE PYELOGRAM, AND LEFT STENT PLACEMENT;  Surgeon: Molli Hazard, MD;  Location: [facility];  Service: Urology;  Laterality: Left;  . elevated LFTs     due to Elmiron  . FINGER SURGERY Left 2004   2nd finger  . G 1 P 1    . interstitial cystitis     with clot in catheter  . SHOULDER ARTHROSCOPY Left   . TOTAL ABDOMINAL HYSTERECTOMY W/ BILATERAL SALPINGOOPHORECTOMY     dysfunctional menses   Family History Family History  Problem Relation Age of Onset  . Coronary artery disease Mother   . Hypertension Mother   . Asthma Mother   . Osteoporosis Mother   . Dementia Mother   . Stroke Mother        mini cva  . Diabetes Father   . Cirrhosis Father        non alcoholic  . Cancer Sister        colon  . Colon cancer Sister 81  . Cancer  Paternal Aunt        2 aunts had renal cancer  . Coronary artery disease Maternal Grandfather   . Cancer Maternal Aunt        breast  . Diabetes Paternal Aunt   . Diabetes Paternal Grandmother     Social History Social History  Substance Use Topics  . Smoking status: Never Smoker  . Smokeless tobacco: Never Used  . Alcohol use Yes     Comment: occ   Allergies Nitroglycerin; Pentosan polysulfate sodium; Bactrim [sulfamethoxazole-trimethoprim]; Promethazine hcl; Lisinopril; and Sulfamethoxazole-trimethoprim  Review of Systems Review of Systems All other systems are reviewed and are negative for acute change except as noted in the HPI  Physical Exam Vital Signs  I have reviewed the triage vital signs BP 139/66   Pulse (!) 48   Temp 97.9 F (36.6 C) (Oral)   Resp 20   SpO2 94%   Physical Exam  Constitutional: She is oriented to person, place, and time. She appears well-developed and well-nourished. No distress.  HENT:  Head: Normocephalic and atraumatic.  Nose: Nose normal.  Eyes: Pupils are equal, round, and reactive to light. Conjunctivae and EOM are normal. Right eye exhibits no discharge. Left eye exhibits no discharge. No scleral icterus.  Neck: Normal range of motion. Neck supple.  Cardiovascular: Normal rate and regular rhythm.  Exam reveals no gallop and no friction rub.   No murmur heard. Pulses:      Dorsalis pedis pulses are 2+ on the right side, and 1+ on the left side.       Posterior tibial pulses are 2+ on the right side, and 1+ on the left side.  Pulmonary/Chest: Effort normal and breath sounds normal. No stridor. No respiratory distress. She has no rales.  Abdominal: Soft. She exhibits no distension. There is no tenderness.  Musculoskeletal: She exhibits no edema.       Left knee: Tenderness found.  Medial joint line tenderness: to popliteal region.       Left upper leg: She exhibits tenderness.       Left lower leg: She exhibits tenderness (to calf).        Legs: Neurological: She is alert and oriented to person, place, and time.  Mental Status:  Alert and oriented to person, place, and time.  Attention and concentration normal.  Speech clear.  Recent memory is intact  Cranial Nerves:  II Visual Fields: Intact to confrontation. Visual fields intact. III, IV, VI: Pupils equal and reactive to light and near. Full eye movement with lateral nystagmus  V Facial Sensation: Normal. No weakness of masticatory muscles  VII: No facial weakness or asymmetry  VIII Auditory Acuity: Grossly normal  IX/X: The uvula is midline; the palate elevates symmetrically  XI: Normal sternocleidomastoid and trapezius strength  XII: The tongue is midline. No atrophy or fasciculations.   Motor System: Muscle Strength: 5/5 and symmetric in the upper and lower extremities. No pronation or drift.  Muscle Tone: Tone and muscle bulk are normal in the upper and lower extremities.   Reflexes: DTRs: 1+ and symmetrical in all four extremities. Coordination:  No tremor.  Sensation: Intact to light touch, and pinprick. Gait: deferred  HINTS: Nystagmus: lateral  Head impulse. normal Skew: normal   Skin: Skin is warm and dry. No rash noted. She is not diaphoretic. No erythema.  Psychiatric: She has a normal mood and affect.  Vitals reviewed.   ED Results and Treatments Labs (all labs ordered are listed, but only abnormal results are displayed) Labs Reviewed  CBC WITH DIFFERENTIAL/PLATELET - Abnormal; Notable for the following:       Result Value   Hemoglobin 11.7 (*)    HCT 34.6 (*)    All other components within normal limits  BASIC METABOLIC PANEL  TROPONIN I                                                                                                                         EKG  EKG Interpretation  Date/Time:    Ventricular Rate:    PR Interval:    QRS Duration:   QT Interval:    QTC Calculation:   R Axis:     Text Interpretation:         Radiology No results found. Pertinent labs & imaging results that were available during my care of the patient were reviewed by me and considered in my medical decision making (see chart for details).  Medications Ordered in ED Medications  fentaNYL (SUBLIMAZE) injection 50 mcg (50 mcg Intravenous Given 05/20/17 2256)  sodium chloride 0.9 % bolus 1,000 mL (1,000 mLs Intravenous New Bag/Given 05/20/17 2256)  Procedures Procedures  (including critical care time)  Medical Decision Making / ED Course I have reviewed the nursing notes for this encounter and the patient's prior records (if available in EHR or on provided paperwork).    Concern for aortic dissection vs arterial occlusion vs DVT vs muscle spasms (though less likely).   Getting CT angio of chest/abd/pelvis and lower extremity with screening labs. Given fentanyl for pain.  Patient care turned over to Dr Betsey Holiday at [place]. Patient case and results discussed in detail; please see their note for further ED managment.      Final Clinical Impression(s) / ED Diagnoses Final diagnoses:  Chest discomfort  Left leg pain  Decreased pedal pulses     This chart was dictated using voice recognition software.  Despite best efforts to proofread,  errors can occur which can change the documentation meaning.   Fatima Blank, MD 05/20/17 [phone]

## 2017-05-20 NOTE — ED Notes (Signed)
EDP at [facility] 

## 2017-05-20 NOTE — ED Notes (Signed)
"  feel better", calmer, rigors have abated, remains alert, NAD, calm, interactive, placed on O2 [place] 2L, pain 3/10, to CT.

## 2017-05-20 NOTE — ED Notes (Signed)
Alert, NAD, calm, interactive, resps e/u, speaking in clear complete sentences, no dyspnea noted, skin W&D, VSS, c/o L leg pain, don't feel right and some sob/heaviness, (denies: other pain, NVD,  dizziness, numbness, tingling, fever,  or visual changes). EDP into room. Family at [facility].

## 2017-05-20 NOTE — ED Triage Notes (Addendum)
PT presents with c/o pain to left lower leg and hypertension. Pt took her bp at home 163/96. PT reports she feels nausea and dry mouth.

## 2017-05-21 ENCOUNTER — Ambulatory Visit (HOSPITAL_BASED_OUTPATIENT_CLINIC_OR_DEPARTMENT_OTHER)
Admit: 2017-05-21 | Discharge: 2017-05-21 | Disposition: A | Discharge status: Home / Self Care | Payer: Medicare Other | Attending: Emergency Medicine | Admitting: Emergency Medicine

## 2017-05-21 DIAGNOSIS — M79662 Pain in left lower leg: Secondary | ICD-10-CM | POA: Diagnosis not present

## 2017-05-21 DIAGNOSIS — K5730 Diverticulosis of large intestine without perforation or abscess without bleeding: Secondary | ICD-10-CM | POA: Diagnosis not present

## 2017-05-21 DIAGNOSIS — M79605 Pain in left leg: Secondary | ICD-10-CM | POA: Diagnosis not present

## 2017-05-21 MED ORDER — ONDANSETRON HCL 4 MG PO TABS: 4.0000 mg | ORAL_TABLET | Freq: Four times a day (QID) | ORAL | 0 refills | Status: DC

## 2017-05-21 MED ORDER — ONDANSETRON HCL 4 MG/2ML IJ SOLN
4.0000 mg | Freq: Once | INTRAMUSCULAR | Status: AC
  Administered 2017-05-21: 4 mg via INTRAVENOUS

## 2017-05-21 MED ORDER — ONDANSETRON HCL 4 MG/2ML IJ SOLN
INTRAMUSCULAR | Status: AC
  Filled 2017-05-21: qty 2

## 2017-05-21 MED ORDER — TRAMADOL HCL 50 MG PO TABS: 50.0000 mg | ORAL_TABLET | Freq: Four times a day (QID) | ORAL | 0 refills | Status: DC | PRN

## 2017-05-21 MED ORDER — ACETAMINOPHEN 325 MG PO TABS
650.0000 mg | ORAL_TABLET | Freq: Once | ORAL | Status: AC
  Administered 2017-05-21: 650 mg via ORAL
  Filled 2017-05-21: qty 2

## 2017-05-21 MED FILL — ONDANSETRON HCL 4 MG TABLET: 4 | 3 days supply | Qty: 12 | Fill #0

## 2017-05-21 NOTE — ED Notes (Signed)
Back from CT, returned to monitor, nauseated and diaphoretic, remains SB (lower than previously), Dr. Leonette Monarch in to see, re-assess and update. Nausea abated. "feels better after cool wet mouth swabs used". LS CTA, throat unremarkable, no rash. Remains on O2 4L, h/o OSA, dilaudid and fentanyl previously given.

## 2017-05-21 NOTE — ED Notes (Signed)
Imaging at [facility] to schedule [place] later today

## 2017-05-21 NOTE — ED Notes (Signed)
PMS intact before and after. Pt tolerated well. All questions answered. 

## 2017-05-21 NOTE — ED Notes (Signed)
Dr. Betsey Holiday at [facility] updating pt/family on results and plan.

## 2017-05-21 NOTE — ED Notes (Signed)
Nausea returned. Dr. Betsey Holiday at [facility]. VSS.

## 2017-05-21 NOTE — ED Provider Notes (Signed)
Patient signed out to me to follow-up on CT scan.  Patient seen for sudden onset of left leg pain followed by dizziness and not feeling well.  Aortic dissection was considered.  Patient had CT angiography of chest, abdomen, pelvis, leg.  This has been read and reviewed.  No acute abnormality noted.  Examination reveals focal tenderness in the popliteal fossa of the left leg.  No masses palpated.  She has normal distal neurologic and vascular function.  Venous phase not identified well, still cannot rule out DVT, although feel unlikely.  With left popliteal fossa pain and tenderness, normal arteries, question Baker's cyst, possible rupture.  Will schedule for outpatient ultrasound to rule out DVT.  Patient has no calf tenderness or swelling, feel pretest probability is low, no empiric Lovenox as duplex will be performed this morning.   Orpah Greek, MD 05/21/17 [phone]

## 2017-05-21 NOTE — ED Notes (Addendum)
Resting comfortably, on monitor, NAD, calm, skin W&D, VSS, daughter arrives to [facility], husband present.

## 2017-05-22 ENCOUNTER — Ambulatory Visit (INDEPENDENT_AMBULATORY_CARE_PROVIDER_SITE_OTHER): Payer: Medicare Other | Admitting: Family Medicine

## 2017-05-22 ENCOUNTER — Encounter: Payer: Self-pay | Admitting: Family Medicine

## 2017-05-22 ENCOUNTER — Ambulatory Visit: Payer: Self-pay

## 2017-05-22 VITALS — BP 130/82 | HR 50 | Ht 62.0 in | Wt 171.0 lb

## 2017-05-22 DIAGNOSIS — M659 Synovitis and tenosynovitis, unspecified: Secondary | ICD-10-CM | POA: Diagnosis not present

## 2017-05-22 DIAGNOSIS — M79605 Pain in left leg: Secondary | ICD-10-CM

## 2017-05-22 MED ORDER — METHYLPREDNISOLONE ACETATE 80 MG/ML IJ SUSP
80.0000 mg | Freq: Once | INTRAMUSCULAR | Status: AC
  Administered 2017-05-22: 80 mg via INTRAMUSCULAR

## 2017-05-22 MED ORDER — KETOROLAC TROMETHAMINE 60 MG/2ML IM SOLN
60.0000 mg | Freq: Once | INTRAMUSCULAR | Status: AC
  Administered 2017-05-22: 60 mg via INTRAMUSCULAR

## 2017-05-22 MED ORDER — PREDNISONE 50 MG PO TABS: 50.0000 mg | ORAL_TABLET | Freq: Every day | ORAL | 0 refills | Status: DC

## 2017-05-22 MED FILL — predniSONE 50 MG TABS: 50 | 5 days supply | Qty: 5 | Fill #0

## 2017-05-22 NOTE — Assessment & Plan Note (Signed)
Patient does have more of a synovitis of the knee. We discussed with patient at great length. Patient declined an injection today but we'll do a first of prednisone with patient having ankylosing spondylosis. We discussed icing regimen, home exercises, which activities to do a which ones to avoid. Patient will come back and see me again in 2 weeks to make sure this is resolved. If not we'll consider injection.

## 2017-05-22 NOTE — Patient Instructions (Addendum)
Good to see you  [name] is your friend.  pennsaid pinkie amount topically 2 times daily as needed.   Prednisone daily for 5 days starting tomorrow.  2 injections today  See me again in 10-14 days in case we need to inject it.

## 2017-05-22 NOTE — Progress Notes (Signed)
[name] [address], [place] [address] Phone: [phone] Subjective:     CC: Left knee pain  TDH:[id number]  [name] is a 71 y.o. female coming in with complaint of left leg pain. She says she had pain in the back of her Left knee on Sunday. TTP. Extension is painful. She couldn't walk after leaving the emergency room. Patient will she was in the emergency room did have a Doppler done. No clot noted. Patient was found to have a small Baker cyst. Patient was put in a knee immobilizer which seemed to make it worse. Patient states that the pain seems to be resolving over the course last 10-12 hours but still somewhat painful. Does have known arthritic changes of the knee  Onset- Sunday Location- Posterior knee Duration-  Character- aching sensation with a sharp pain more on the popliteal fossa Aggravating factors- Walking, stairs Reliving factors- Pain meds from the emergency room  Severity-was 9 out of 10 initially     Past Medical History:  Diagnosis Date  . Allergy    seasonal  . Ankylosing spondylitis ([place])   . Anxiety   . Arthritis   . Bell palsy   . CTS (carpal tunnel syndrome)   . Cystitis   . Diverticulitis   . Diverticulosis of colon   . Dry eye syndrome   . Frozen shoulder   . GERD (gastroesophageal reflux disease)   . HLA B27 (HLA B27 positive)   . Hx of colonic polyps   . Hyperlipidemia    NMR 2005  . Hypertension   . Hypothyroidism   . Incontinence   . Internal hemorrhoids   . Interstitial cystitis   . Kidney stone   . Menopause   . Osteoarthritis   . Osteopenia    [facility]  . S/P total hysterectomy and bilateral salpingo-oophorectomy 04/12/2014  . Sleep apnea   . Subjective visual disturbance of both eyes 12/30/2013  . Tubular adenoma of colon   . Vitamin D deficiency   . Xerostomia    and Xeroophthalmia   Past Surgical History:  Procedure Laterality Date  . ABDOMINAL  HYSTERECTOMY    . BLADDER SURGERY     for incontinence  . CARPAL TUNNEL RELEASE Left 12/22/2014   Procedure: LEFT CARPAL TUNNEL RELEASE;  Surgeon: Daryll Brod, MD;  Location: [place];  Service: Orthopedics;  Laterality: Left;  . COLONOSCOPY W/ POLYPECTOMY     adenomatous poyp 04-2008  . colonscopy     tics 10-2002  . CYSTOSCOPY WITH RETROGRADE PYELOGRAM, URETEROSCOPY AND STENT PLACEMENT Left 09/01/2013   Procedure: CYSTOSCOPY WITH RETROGRADE PYELOGRAM, AND LEFT STENT PLACEMENT;  Surgeon: Molli Hazard, MD;  [facility];  Service: Urology;  Laterality: Left;  . elevated LFTs     due to Elmiron  . FINGER SURGERY Left 2004   2nd finger  . G 1 P 1    . interstitial cystitis     with clot in catheter  . SHOULDER ARTHROSCOPY Left   . TOTAL ABDOMINAL HYSTERECTOMY W/ BILATERAL SALPINGOOPHORECTOMY     dysfunctional menses   Social History   Social History  . Marital status: Married    Spouse name: N/A  . Number of children: 1  . Years of education: BS   Occupational History  . retired   . teacher     Kindergarten   Social History Main Topics  . Smoking status: Never  Smoker  . Smokeless tobacco: Never Used  . Alcohol use Yes     Comment: occ  . Drug use: No  . Sexual activity: Not Currently   Other Topics Concern  . Not on file   Social History Narrative   Lives   Caffeine use:    Married         Allergies  Allergen Reactions  . Nitroglycerin Anaphylaxis    Hypotensive after NTG administration in ER during chest pain evaluation  . Pentosan Polysulfate Sodium     Elevated LFTs.......Marland Kitchen elmiron   . Bactrim [Sulfamethoxazole-Trimethoprim]   . Promethazine Hcl     "jittery on the inside"  . Lisinopril Cough  . Sulfamethoxazole-Trimethoprim Nausea Only    Unknown reaction per pt   Family History  Problem Relation Age of Onset  . Coronary artery disease Mother   . Hypertension Mother   . Asthma Mother   . Osteoporosis Mother   .  Dementia Mother   . Stroke Mother        mini cva  . Diabetes Father   . Cirrhosis Father        non alcoholic  . Cancer Sister        colon  . Colon cancer Sister 70  . Cancer Paternal Aunt        2 aunts had renal cancer  . Coronary artery disease Maternal Grandfather   . Cancer Maternal Aunt        breast  . Diabetes Paternal Aunt   . Diabetes Paternal Grandmother      Past medical history, social, surgical and family history all reviewed in electronic medical record.  No pertanent information unless stated regarding to the chief complaint.   Review of Systems:Review of systems updated and as accurate as of 05/22/17  No headache, visual changes, nausea, vomiting, diarrhea, constipation, dizziness, abdominal pain, skin rash, fevers, chills, night sweats, weight loss, swollen lymph nodes, chest pain, shortness of breath, mood changes. Positive muscle aches and joint swelling  Objective  There were no vitals taken for this visit. Systems examined below as of 05/22/17   General: No apparent distress alert and oriented x3 mood and affect normal, dressed appropriately.  HEENT: Pupils equal, extraocular movements intact  Respiratory: Patient's speak in full sentences and does not appear short of breath  Cardiovascular: No lower extremity edema, non tender, no erythema  Skin: Warm dry intact with no signs of infection or rash on extremities or on axial skeleton.  Abdomen: Soft nontender  Neuro: Cranial nerves II through XII are intact, neurovascularly intact in all extremities with 2+ DTRs and 2+ pulses.  Lymph: No lymphadenopathy of posterior or anterior cervical chain or axillae bilaterally.  Gait normal with good balance and coordination.  MSK:  Non tender with full range of motion and good stability and symmetric strength and tone of shoulders, elbows, wrist, hip, and ankles bilaterally.  Knee: Left Mild valgus to find a noted. Palpation reveals more pain in the popliteal  fossa ROM shows mild limitation lacking the last 5 of extension and last 10 of flexion Ligaments with solid consistent endpoints including ACL, PCL, LCL, MCL. Negative Mcmurray's, Apley's, and Thessalonian tests. painful patellar compression. Patellar glide with moderate crepitus. Patellar and quadriceps tendons unremarkable. Hamstring and quadriceps strength is normal.  Contralateral knee unremarkable.  MSK US performed of: Left knee This study was ordered, performed, and interpreted by Charlann Boxer D.O.  Knee: Mild to not moderate narrowing of all tricompartment areas.  Baker's cyst noted the patient seems to have a hole in it at the moment. Seems to be compressible. Severe synovitis noted.  IMPRESSION:  Ruptured Baker's cyst with synovitis. Moderate arthritis    Impression and Recommendations:     This case required medical decision making of moderate complexity.      Note: This dictation was prepared with Dragon dictation along with smaller phrase technology. Any transcriptional errors that result from this process are unintentional.

## 2017-05-23 DIAGNOSIS — N952 Postmenopausal atrophic vaginitis: Secondary | ICD-10-CM | POA: Diagnosis not present

## 2017-05-23 DIAGNOSIS — N3946 Mixed incontinence: Secondary | ICD-10-CM | POA: Diagnosis not present

## 2017-05-23 DIAGNOSIS — N8111 Cystocele, midline: Secondary | ICD-10-CM | POA: Diagnosis not present

## 2017-05-23 DIAGNOSIS — N398 Other specified disorders of urinary system: Secondary | ICD-10-CM | POA: Diagnosis not present

## 2017-05-23 MED FILL — ESTRADIOL 0.1 MG/GM CREA: 0.1 | 90 days supply | Qty: 43 | Fill #0

## 2017-05-28 ENCOUNTER — Other Ambulatory Visit: Payer: Self-pay | Admitting: Emergency Medicine

## 2017-05-28 MED ORDER — ZOSTER VAC RECOMB ADJUVANTED 50 MCG/0.5ML IM SUSR
0.5000 mL | Freq: Once | INTRAMUSCULAR | 1 refills | Status: AC
Start: 2017-05-28 — End: 2017-05-28

## 2017-05-30 ENCOUNTER — Telehealth: Payer: Self-pay | Admitting: Cardiology

## 2017-05-30 NOTE — Telephone Encounter (Signed)
New message   Patient concerned that medication may need to be increased.   Pt c/o BP issue:  1. What are your last 5 BP readings? 137/79, 150/82, 118/67, 152/85, 166/84 2. Are you having any other symptoms (ex. Dizziness, headache, blurred vision, passed out)? dizziness 3. What is your medication issue? Patient thinks maybe medication should be adjusted.

## 2017-05-30 NOTE — Telephone Encounter (Signed)
Pt called because yesterday her BP was 152/82 in th afternoon. Pt states that she may need to adjust her medication. This morning pt took her BP 137/79 prior taken her BP medication. Pt states she was somehow dyzziness. Pt was made aware that a BP of 137/79 probable  would not make you dizzy. Pt states that she just now planning to take her Metoprolol 25 mg which is her daily medication. On the last O/V with Dr. Meda Coffee pt was prescribed   Toprol XL 25 mg and Hydrochlorothiazide 12.5 mg once daily. 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 Pt states that she has an appointment with Dr. Meda Coffee in December 2018 she will wait to see about any BP medications adjustments then.

## 2017-06-02 NOTE — Progress Notes (Signed)
[name] [place],  [address] Phone: [phone] Subjective:      CC: Knee pain follow-up  [name]:[id number]  [name] is a 71 y.o. female coming in with complaint of knee pain. Left knee had what appeared to be more of a Baker cyst rupture. Has known degenerative changes and has even had more of a synovitis of the left knee. Patient elected try conservative therapy with bracing, icing, topical anti-inflammatories. Patient states overall improving.  Not having as much pain.  States that has not had any more of the swelling.       Past Medical History:  Diagnosis Date  . Allergy    seasonal  . Ankylosing spondylitis ([place])   . Anxiety   . Arthritis   . Bell palsy   . CTS (carpal tunnel syndrome)   . Cystitis   . Diverticulitis   . Diverticulosis of colon   . Dry eye syndrome   . Frozen shoulder   . GERD (gastroesophageal reflux disease)   . HLA B27 (HLA B27 positive)   . Hx of colonic polyps   . Hyperlipidemia    NMR 2005  . Hypertension   . Hypothyroidism   . Incontinence   . Internal hemorrhoids   . Interstitial cystitis   . Kidney stone   . Menopause   . Osteoarthritis   . Osteopenia    [facility]  . S/P total hysterectomy and bilateral salpingo-oophorectomy 04/12/2014  . Sleep apnea   . Subjective visual disturbance of both eyes 12/30/2013  . Tubular adenoma of colon   . Vitamin D deficiency   . Xerostomia    and Xeroophthalmia   Past Surgical History:  Procedure Laterality Date  . ABDOMINAL HYSTERECTOMY    . BLADDER SURGERY     for incontinence  . COLONOSCOPY W/ POLYPECTOMY     adenomatous poyp 04-2008  . colonscopy     tics 10-2002  . elevated LFTs     due to Elmiron  . FINGER SURGERY Left 2004   2nd finger  . G 1 P 1    . interstitial cystitis     with clot in catheter  . SHOULDER ARTHROSCOPY Left   . TOTAL ABDOMINAL HYSTERECTOMY W/ BILATERAL SALPINGOOPHORECTOMY     dysfunctional menses   Social History   Socioeconomic History  . Marital status: Married    Spouse name: None  . Number of children: 1  . Years of education: BS  . Highest education level: None  Social Needs  . Financial resource strain: None  . Food insecurity - worry: None  . Food insecurity - inability: None  . Transportation needs - medical: None  . Transportation needs - non-medical: None  Occupational History  . Occupation: retired  . Occupation: Pharmacist, hospital    Comment: Kindergarten  Tobacco Use  . Smoking status: Never Smoker  . Smokeless tobacco: Never Used  Substance and Sexual Activity  . Alcohol use: Yes    Comment: occ  . Drug use: No  . Sexual activity: Not Currently  Other Topics Concern  . None  Social History Narrative   Lives   Caffeine use:    Married      Allergies  Allergen Reactions  . Nitroglycerin Anaphylaxis    Hypotensive after NTG administration in ER during chest pain evaluation  . Pentosan Polysulfate Sodium     Elevated LFTs.......Marland Kitchen elmiron   . Bactrim [Sulfamethoxazole-Trimethoprim]   .  Promethazine Hcl     "jittery on the inside"  . Lisinopril Cough  . Sulfamethoxazole-Trimethoprim Nausea Only    Unknown reaction per pt   Family History  Problem Relation Age of Onset  . Coronary artery disease Mother   . Hypertension Mother   . Asthma Mother   . Osteoporosis Mother   . Dementia Mother   . Stroke Mother        mini cva  . Diabetes Father   . Cirrhosis Father        non alcoholic  . Cancer Sister        colon  . Colon cancer Sister 12  . Cancer Paternal Aunt        2 aunts had renal cancer  . Coronary artery disease Maternal Grandfather   . Cancer Maternal Aunt        breast  . Diabetes Paternal Aunt   . Diabetes Paternal Grandmother      Past medical history, social, surgical and family history all reviewed in electronic medical record.  No pertanent information unless stated regarding to the chief complaint.    Review of Systems:Review of systems updated and as accurate as of 06/04/17  No headache, visual changes, nausea, vomiting, diarrhea, constipation, dizziness, abdominal pain, skin rash, fevers, chills, night sweats, weight loss, swollen lymph nodes, body aches, joint swelling,chest pain, shortness of breath, mood changes.  No muscle aches  Objective  Blood pressure 124/80, pulse 63, height 5' 2"  (1.575 m), weight 165 lb (74.8 kg), SpO2 97 %. Systems examined below as of 06/04/17   General: No apparent distress alert and oriented x3 mood and affect normal, dressed appropriately.  HEENT: Pupils equal, extraocular movements intact  Respiratory: Patient's speak in full sentences and does not appear short of breath  Cardiovascular: No lower extremity edema, non tender, no erythema  Skin: Warm dry intact with no signs of infection or rash on extremities or on axial skeleton.  Abdomen: Soft nontender  Neuro: Cranial nerves II through XII are intact, neurovascularly intact in all extremities with 2+ DTRs and 2+ pulses.  Lymph: No lymphadenopathy of posterior or anterior cervical chain or axillae bilaterally.  Gait mild antalgic gait MSK:  Non tender with full range of motion and good stability and symmetric strength and tone of shoulders, elbows, wrist, hip, and ankles bilaterally.  Knee: Left valgus deformity noted.  Abnormal thigh to calf ratio.  Tender to palpation over medial and PF joint line.  ROM full in flexion and extension and lower leg rotation. instability with valgus force.  painful patellar compression. Patellar glide with moderate crepitus. Patellar and quadriceps tendons unremarkable. Hamstring and quadriceps strength is normal. Contralateral knee shows mild arthritic changes as well.   Impression and Recommendations:     This case required medical decision making of moderate complexity.      Note: This dictation was prepared with Dragon dictation along with smaller  phrase technology. Any transcriptional errors that result from this process are unintentional.

## 2017-06-04 ENCOUNTER — Ambulatory Visit: Payer: Self-pay | Admitting: Family Medicine

## 2017-06-04 ENCOUNTER — Encounter: Payer: Self-pay | Admitting: Family Medicine

## 2017-06-04 ENCOUNTER — Ambulatory Visit (INDEPENDENT_AMBULATORY_CARE_PROVIDER_SITE_OTHER): Payer: Medicare Other | Admitting: Family Medicine

## 2017-06-04 DIAGNOSIS — M659 Synovitis and tenosynovitis, unspecified: Secondary | ICD-10-CM | POA: Diagnosis not present

## 2017-06-04 MED ORDER — VITAMIN D (ERGOCALCIFEROL) 1.25 MG (50000 UNIT) PO CAPS: 50000.0000 [IU] | ORAL_CAPSULE | ORAL | 0 refills | Status: DC

## 2017-06-04 MED FILL — VIT D2 1.25 MG (50,000 UNIT: 1.25 MG | 84 days supply | Qty: 12 | Fill #0

## 2017-06-04 NOTE — Patient Instructions (Signed)
Good to see you  Ice is your friend Once weekly vitamin D for 12 weeks.  Stay active.  Lets keep it going otherwise See me again in 6-8 weeks.

## 2017-06-04 NOTE — Assessment & Plan Note (Signed)
Resolved at this time.  Continue to monitor.  Likely the underlying arthritis noncontributory from time to time.  Patient declined anything else significantly more at the moment.  Patient will follow up with me 6-8 weeks.

## 2017-06-11 MED FILL — SHINGRIX 50 MCG SUS: 50 | 1 days supply | Qty: 1 | Fill #0

## 2017-06-26 MED FILL — ROSUVASTATIN CALCIUM 5 MG T: 5 | 30 days supply | Qty: 30 | Fill #3

## 2017-06-26 MED FILL — PREVIDENT 5000 1.1% DRY MOU: 1.1 | 30 days supply | Qty: 100 | Fill #0

## 2017-06-26 MED FILL — OMEPRAZOLE DR 40 MG CAPSULE: 40 | 30 days supply | Qty: 30 | Fill #1

## 2017-07-14 NOTE — Progress Notes (Signed)
[name] [place],  [address] Phone: [phone] Subjective:    I'm seeing this patient by the request  of:    CC: Left knee pain  PHX:[id number]  [name] is a 71 y.o. female coming in with complaint of left knee pain.  Patient was found to have more of a synovitis of the left knee.  Patient does have an autoimmune disease.  Patient states overall doing well.  Patient has not noticed any hurting as much.  Continues to have some difficulty      Past Medical History:  Diagnosis Date  . Allergy    seasonal  . Ankylosing spondylitis ([place])   . Anxiety   . Arthritis   . Bell palsy   . CTS (carpal tunnel syndrome)   . Cystitis   . Diverticulitis   . Diverticulosis of colon   . Dry eye syndrome   . Frozen shoulder   . GERD (gastroesophageal reflux disease)   . HLA B27 (HLA B27 positive)   . Hx of colonic polyps   . Hyperlipidemia    NMR 2005  . Hypertension   . Hypothyroidism   . Incontinence   . Internal hemorrhoids   . Interstitial cystitis   . Kidney stone   . Menopause   . Osteoarthritis   . Osteopenia    [facility]  . S/P total hysterectomy and bilateral salpingo-oophorectomy 04/12/2014  . Sleep apnea   . Subjective visual disturbance of both eyes 12/30/2013  . Tubular adenoma of colon   . Vitamin D deficiency   . Xerostomia    and Xeroophthalmia   Past Surgical History:  Procedure Laterality Date  . ABDOMINAL HYSTERECTOMY    . BLADDER SURGERY     for incontinence  . CARPAL TUNNEL RELEASE Left 12/22/2014   Procedure: LEFT CARPAL TUNNEL RELEASE;  Surgeon: Daryll Brod, MD;  Location: [facility];  Service: Orthopedics;  Laterality: Left;  . COLONOSCOPY W/ POLYPECTOMY     adenomatous poyp 04-2008  . colonscopy     tics 10-2002  . CYSTOSCOPY WITH RETROGRADE PYELOGRAM, URETEROSCOPY AND STENT PLACEMENT Left 09/01/2013   Procedure: CYSTOSCOPY WITH RETROGRADE PYELOGRAM, AND  LEFT STENT PLACEMENT;  Surgeon: Molli Hazard, MD;  Location: [facility];  Service: Urology;  Laterality: Left;  . elevated LFTs     due to Elmiron  . FINGER SURGERY Left 2004   2nd finger  . G 1 P 1    . interstitial cystitis     with clot in catheter  . SHOULDER ARTHROSCOPY Left   . TOTAL ABDOMINAL HYSTERECTOMY W/ BILATERAL SALPINGOOPHORECTOMY     dysfunctional menses   Social History   Socioeconomic History  . Marital status: Married    Spouse name: None  . Number of children: 1  . Years of education: BS  . Highest education level: None  Social Needs  . Financial resource strain: None  . Food insecurity - worry: None  . Food insecurity - inability: None  . Transportation needs - medical: None  . Transportation needs - non-medical: None  Occupational History  . Occupation: retired  . Occupation: Pharmacist, hospital    Comment: Kindergarten  Tobacco Use  . Smoking status: Never Smoker  . Smokeless tobacco: Never Used  Substance and Sexual Activity  . Alcohol use: Yes    Comment: occ  . Drug use: No  . Sexual activity: Not Currently  Other Topics  Concern  . None  Social History Narrative   Lives   Caffeine use:    Married      Allergies  Allergen Reactions  . Nitroglycerin Anaphylaxis    Hypotensive after NTG administration in ER during chest pain evaluation  . Pentosan Polysulfate Sodium     Elevated LFTs.......Marland Kitchen elmiron   . Bactrim [Sulfamethoxazole-Trimethoprim]   . Promethazine Hcl     "jittery on the inside"  . Lisinopril Cough  . Sulfamethoxazole-Trimethoprim Nausea Only    Unknown reaction per pt   Family History  Problem Relation Age of Onset  . Coronary artery disease Mother   . Hypertension Mother   . Asthma Mother   . Osteoporosis Mother   . Dementia Mother   . Stroke Mother        mini cva  . Diabetes Father   . Cirrhosis Father        non alcoholic  . Cancer Sister        colon  . Colon cancer Sister 20  . Cancer Paternal Aunt        2  aunts had renal cancer  . Coronary artery disease Maternal Grandfather   . Cancer Maternal Aunt        breast  . Diabetes Paternal Aunt   . Diabetes Paternal Grandmother      Past medical history, social, surgical and family history all reviewed in electronic medical record.  No pertanent information unless stated regarding to the chief complaint.   Review of Systems:Review of systems updated and as accurate as of 07/16/17  No headache, visual changes, nausea, vomiting, diarrhea, constipation, dizziness, abdominal pain, skin rash, fevers, chills, night sweats, weight loss, swollen lymph nodes, body aches, joint swelling,  chest pain, shortness of breath, mood changes.  Positive muscle aches  Objective  Blood pressure 124/80, pulse (!) 56, height 5' 3"  (1.6 m), weight 165 lb (74.8 kg), SpO2 97 %. Systems examined below as of 07/16/17   General: No apparent distress alert and oriented x3 mood and affect normal, dressed appropriately.  HEENT: Pupils equal, extraocular movements intact  Respiratory: Patient's speak in full sentences and does not appear short of breath  Cardiovascular: No lower extremity edema, non tender, no erythema  Skin: Warm dry intact with no signs of infection or rash on extremities or on axial skeleton.  Abdomen: Soft nontender  Neuro: Cranial nerves II through XII are intact, neurovascularly intact in all extremities with 2+ DTRs and 2+ pulses.  Lymph: No lymphadenopathy of posterior or anterior cervical chain or axillae bilaterally.  Gait normal with good balance and coordination.  MSK:  Non tender with full range of motion and good stability and symmetric strength and tone of shoulders, elbows, wrist, hip, and ankles bilaterally.  Arthritic changes in multiple joints Knee: Left valgus deformity noted.  Abnormal thigh to calf ratio.  Tender to palpation over medial and PF joint line.  ROM full in flexion and extension and lower leg rotation. instability with  valgus force.  painful patellar compression. Patellar glide with moderate crepitus. Patellar and quadriceps tendons unremarkable. Hamstring and quadriceps strength is normal. Contralateral knee shows mild arthritic changes as well    Impression and Recommendations:     This case required medical decision making of moderate complexity.      Note: This dictation was prepared with Dragon dictation along with smaller phrase technology. Any transcriptional errors that result from this process are unintentional.

## 2017-07-16 ENCOUNTER — Encounter: Payer: Self-pay | Admitting: Family Medicine

## 2017-07-16 ENCOUNTER — Ambulatory Visit (INDEPENDENT_AMBULATORY_CARE_PROVIDER_SITE_OTHER): Payer: Medicare Other | Admitting: Family Medicine

## 2017-07-16 DIAGNOSIS — M659 Synovitis and tenosynovitis, unspecified: Secondary | ICD-10-CM | POA: Diagnosis not present

## 2017-07-16 MED ORDER — VITAMIN D (ERGOCALCIFEROL) 1.25 MG (50000 UNIT) PO CAPS: 50000.0000 [IU] | ORAL_CAPSULE | ORAL | 0 refills | Status: DC

## 2017-07-16 NOTE — Assessment & Plan Note (Signed)
Resolved at this time.  Likely more of a patella femoral subluxation.  Discussed with patient about the underlying synovitis again.  No significant findings of any inflammation at this time.  We discussed icing regimen and home exercises.  Discussed which activities to do which wants to avoid.  Patient will follow up with me again in 65months

## 2017-07-16 NOTE — Patient Instructions (Signed)
Good to see you  Alvera Singh is your friend.  Stay active Refilled vitamin D  Lets keep checking in every 2 months Happy New Year!

## 2017-07-23 MED FILL — OMEPRAZOLE DR 40 MG CAPSULE: 40 | 30 days supply | Qty: 30 | Fill #2

## 2017-07-23 MED FILL — METOPROLOL SUCC ER 25 MG TA: 25 | 90 days supply | Qty: 90 | Fill #3

## 2017-07-23 MED FILL — ROSUVASTATIN CALCIUM 5 MG T: 5 | 30 days supply | Qty: 30 | Fill #4

## 2017-07-30 ENCOUNTER — Ambulatory Visit (INDEPENDENT_AMBULATORY_CARE_PROVIDER_SITE_OTHER): Payer: Medicare Other | Admitting: Cardiology

## 2017-07-30 ENCOUNTER — Encounter: Payer: Self-pay | Admitting: Cardiology

## 2017-07-30 VITALS — BP 124/74 | HR 56 | Ht 63.0 in | Wt 166.0 lb

## 2017-07-30 DIAGNOSIS — Z1589 Genetic susceptibility to other disease: Secondary | ICD-10-CM | POA: Diagnosis not present

## 2017-07-30 DIAGNOSIS — R42 Dizziness and giddiness: Secondary | ICD-10-CM | POA: Diagnosis not present

## 2017-07-30 DIAGNOSIS — I2510 Atherosclerotic heart disease of native coronary artery without angina pectoris: Secondary | ICD-10-CM | POA: Diagnosis not present

## 2017-07-30 DIAGNOSIS — E785 Hyperlipidemia, unspecified: Secondary | ICD-10-CM | POA: Diagnosis not present

## 2017-07-30 DIAGNOSIS — I10 Essential (primary) hypertension: Secondary | ICD-10-CM

## 2017-07-30 NOTE — Progress Notes (Signed)
Cardiology Office Note    Date:  08/01/2017   ID:  HENLI HEY, DOB [birth year], MRN [id number]  PCP:  [name] Rail, MD  Cardiologist:  [name] Dawley, MD   Chief complain: 6 months follow up  History of Present Illness:  [name] is a 71 y.o. female with prior history of hypertension, hyperlipidemia who was previously seen in our office in 2014 for concerns of chest pain and underwent an exercise nuclear stress test that showed LVEF of 70% and no prior infarct or ischemia. She is coming 3 years later for occasional left-sided chest pain not related to exertion. She has been treated with Toprol for high blood pressure. She complains of fatigue and occasional dizziness. She also has occasional lower extremity edema. Denies any orthopnea or proximal nocturnal dyspnea. 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 She has never had an echocardiogram to evaluate for aortic regurgitation or ascending aortic aneurysm. She denies any palpitations or syncope.  10/17/2016 - the patient is coming after 4 weeks, she has visited ER on 10/12/16 with dizziness and chest pain and was discharged home after ACS was ruled out. She states that she feels dizzy ever since she started Benicar. She brings her BP diary and BP in the range 96-150, but most in 120-130.  She has also been experiencing DOE after walking 1 flight of stair associated with left sided chest pain that is pressure like and has no radiation. She has occassional LE edema.   07/30/2017 - 6 months follow up, the patient feels well, denies chest pain or SOB, also denies palpitations, or syncope. No claudications She has been experiencing dizziness, not just orthostatic, but also when is standing for longer time, no falls. She is tolerating medications well including statin.  Past Medical History:  Diagnosis Date  . Allergy    seasonal  . Ankylosing spondylitis ([place])   . Anxiety   .  Arthritis   . Bell palsy   . CTS (carpal tunnel syndrome)   . Cystitis   . Diverticulitis   . Diverticulosis of colon   . Dry eye syndrome   . Frozen shoulder   . GERD (gastroesophageal reflux disease)   . HLA B27 (HLA B27 positive)   . Hx of colonic polyps   . Hyperlipidemia    NMR 2005  . Hypertension   . Hypothyroidism   . Incontinence   . Internal hemorrhoids   . Interstitial cystitis   . Kidney stone   . Menopause   . Osteoarthritis   . Osteopenia    [facility]  . S/P total hysterectomy and bilateral salpingo-oophorectomy 04/12/2014  . Sleep apnea   . Subjective visual disturbance of both eyes 12/30/2013  . Tubular adenoma of colon   . Vitamin D deficiency   . Xerostomia    and Xeroophthalmia    Past Surgical History:  Procedure Laterality Date  . ABDOMINAL HYSTERECTOMY    . BLADDER SURGERY     for incontinence  . CARPAL TUNNEL RELEASE Left 12/22/2014   Procedure: LEFT CARPAL TUNNEL RELEASE;  Surgeon: Daryll Brod, MD;  Location: [facility];  Service: Orthopedics;  Laterality: Left;  . COLONOSCOPY W/ POLYPECTOMY     adenomatous poyp 04-2008  . colonscopy     tics 10-2002  . CYSTOSCOPY WITH RETROGRADE PYELOGRAM, URETEROSCOPY AND STENT PLACEMENT Left 09/01/2013   Procedure: CYSTOSCOPY WITH RETROGRADE PYELOGRAM, AND LEFT  STENT PLACEMENT;  Surgeon: Molli Hazard, MD;  Location: [facility];  Service: Urology;  Laterality: Left;  . elevated LFTs     due to Elmiron  . FINGER SURGERY Left 2004   2nd finger  . G 1 P 1    . interstitial cystitis     with clot in catheter  . SHOULDER ARTHROSCOPY Left   . TOTAL ABDOMINAL HYSTERECTOMY W/ BILATERAL SALPINGOOPHORECTOMY     dysfunctional menses    Current Medications: Outpatient Medications Prior to Visit  Medication Sig Dispense Refill  . acetaminophen (TYLENOL) 500 MG tablet Take 500 mg by mouth every 4 (four) hours as needed for mild pain.     . Ascorbic Acid (VITAMIN C) 1000 MG  tablet Take 1,000 mg by mouth daily.     Marland Kitchen aspirin 81 MG tablet Take 81 mg by mouth daily.      Marland Kitchen BIOTIN PO Take 1 tablet by mouth daily.    . blood glucose meter kit and supplies KIT Dispense based on patient and insurance preference. Use up to four times daily as directed. prediabetes 1 each 0  . Calcium Carbonate-Vit D-Min (CALCIUM 1200 PO) Take 600 mg by mouth daily.     . cetirizine (ZYRTEC) 10 MG tablet Take 10 mg by mouth daily.      . Diclofenac Sodium 2 % SOLN Place onto the skin as directed for pain as needed.    . hyoscyamine (LEVSIN SL) 0.125 MG SL tablet Place 1 tablet (0.125 mg total) under the tongue every 4 (four) hours as needed. 15 tablet 1  . MAGNESIUM PO Take 1 capsule by mouth daily. With vitamin B6    . meloxicam (MOBIC) 15 MG tablet Take 1 tablet (15 mg total) by mouth daily as needed for pain. With food 90 tablet 2  . metoprolol succinate (TOPROL XL) 25 MG 24 hr tablet Take 1 tablet (25 mg total) by mouth daily. 90 tablet 3  . Misc Natural Products (COSAMIN ASU ADVANCED FORMULA PO) Take 2 each by mouth daily.    . Omega-3 Fatty Acids (FISH OIL BURP-LESS) 1000 MG CAPS Takes 1000 mg BID 60 capsule 0  . omeprazole (PRILOSEC) 40 MG capsule Take 1 capsule (40 mg total) by mouth daily. 30 capsule 5  . ondansetron (ZOFRAN) 4 MG tablet Take 1 tablet (4 mg total) by mouth every 6 (six) hours. 12 tablet 0  . Polyethyl Glycol-Propyl Glycol (SYSTANE OP) Apply 1 drop to eye as needed. Apply to both eyes as needed    . Probiotic Product (VSL#3) CAPS Take 1 capsule by mouth once daily 30 capsule 5  . rosuvastatin (CRESTOR) 5 MG tablet Take 1 tablet (5 mg total) by mouth daily. 90 tablet 2  . Teriparatide, Recombinant, (FORTEO [place]) Inject into the skin.    Marland Kitchen vitamin B-12 (CYANOCOBALAMIN) 1000 MCG tablet Take 1,000 mcg by mouth daily.     . Vitamin D, Ergocalciferol, (DRISDOL) 50000 units CAPS capsule Take 1 capsule (50,000 Units total) by mouth every 7 (seven) days. 12 capsule 0  .  vitamin E 400 UNIT capsule Take 400 Units by mouth daily.    . Cholecalciferol (VITAMIN D3) 5000 units TABS Take 5,000 mg by mouth daily.    Marland Kitchen gabapentin (NEURONTIN) 100 MG capsule Take 2 capsules (200 mg total) by mouth at bedtime. 60 capsule 3  . predniSONE (DELTASONE) 50 MG tablet Take 1 tablet (50 mg total) by mouth daily. 5 tablet 0  . traMADol (ULTRAM) 50  MG tablet Take 1 tablet (50 mg total) by mouth every 6 (six) hours as needed. 15 tablet 0   No facility-administered medications prior to visit.      Allergies:   Nitroglycerin; Pentosan polysulfate sodium; Sulfamethoxazole-trimethoprim; Bactrim [sulfamethoxazole-trimethoprim]; Promethazine hcl; and Lisinopril   Social History   Socioeconomic History  . Marital status: Married    Spouse name: None  . Number of children: 1  . Years of education: BS  . Highest education level: None  Social Needs  . Financial resource strain: None  . Food insecurity - worry: None  . Food insecurity - inability: None  . Transportation needs - medical: None  . Transportation needs - non-medical: None  Occupational History  . Occupation: retired  . Occupation: Pharmacist, hospital    Comment: Kindergarten  Tobacco Use  . Smoking status: Never Smoker  . Smokeless tobacco: Never Used  Substance and Sexual Activity  . Alcohol use: Yes    Comment: occ  . Drug use: No  . Sexual activity: Not Currently  Other Topics Concern  . None  Social History Narrative   Lives   Caffeine use:    Married        Family History:  The patient's family history includes Asthma in her mother; Cancer in her maternal aunt, paternal aunt, and sister; Cirrhosis in her father; Colon cancer (age of onset: 65) in her sister; Coronary artery disease in her maternal grandfather and mother; Dementia in her mother; Diabetes in her father, paternal aunt, and paternal grandmother; Hypertension in her mother; Osteoporosis in her mother; Stroke in her mother.   ROS:   Please see the  history of present illness.    ROS All other systems reviewed and are negative.   PHYSICAL EXAM:   VS:  BP 124/74   Pulse (!) 56   Ht 5' 3"  (1.6 m)   Wt 166 lb (75.3 kg)   BMI 29.41 kg/m    GEN: Well nourished, well developed, in no acute distress  HEENT: normal  Neck: no JVD, carotid bruits, or masses Cardiac: RRR; no murmurs, rubs, or gallops,no edema  Respiratory:  clear to auscultation bilaterally, normal work of breathing GI: soft, nontender, nondistended, + BS MS: no deformity or atrophy  Skin: warm and dry, no rash Neuro:  Alert and Oriented x 3, Strength and sensation are intact Psych: euthymic mood, full affect  Wt Readings from Last 3 Encounters:  07/30/17 166 lb (75.3 kg)  07/16/17 165 lb (74.8 kg)  06/04/17 165 lb (74.8 kg)    Studies/Labs Reviewed:   EKG:  EKG is ordered today.  The ekg ordered today demonstrates sinus bradycardia, 56 bpm, left axis deviation, nonspecific T wave abnormalities.  Recent Labs: 01/15/2017: TSH 3.48 05/02/2017: ALT 30; Magnesium 2.1 05/20/2017: BUN 23; Creatinine, Ser 1.27; Hemoglobin 11.7; Platelets 236; Potassium 3.8; Sodium 138   Lipid Panel    Component Value Date/Time   CHOL 227 (H) 05/02/2017 0923   CHOL 113 12/07/2016 0741   TRIG 160.0 (H) 05/02/2017 0923   TRIG 181 (H) 07/23/2006 1101   HDL 65.70 05/02/2017 0923   HDL 54 12/07/2016 0741   CHOLHDL 3 05/02/2017 0923   VLDL 32.0 05/02/2017 0923   LDLCALC 129 (H) 05/02/2017 0923   LDLCALC 44 12/07/2016 0741   LDLDIRECT 114.0 05/02/2016 0919   Additional studies/ records that were reviewed today include:   ECG performed today 07/30/2017 was personally reviewed and showed NSR, LAD, poor R wave progression in the anterior leads,  unchanged from prior   ASSESSMENT:    1. Dizziness   2. CAD in native artery   3. Hypertension, unspecified type   4. Hyperlipidemia, unspecified hyperlipidemia type     PLAN:  In order of problems listed above:  1. Dizziness - order  carotid US, advised to try vestibular exercises, ENT physician excluded ear infection 2. Hypertension - controlled, there is mild to moderate concentric LVH, LVEF 60-65% 3. Hyperlipidemia - patient's LDL and triglycerides are elevated, together with moderate CAD on coronary CTA she was started on rosuvastatin 5 mg po daily, she is tolerating it well, we will repeat lipids and LFTs now. 4. Chest pain - negative stress test in the past, calcium score and coronary CTA showed coronary calcium score of 56. This was 46 percentile for age and sex matched control. Moderate CAD in RCA/PDA, CT FFR was normal. 5. HLA B gene positivity - associated ankylosing spondylitis, echo in 09/2016 showed no ascending aortic aneurysm but mild aortic regurgitation. We will follow every other year.  Check CMP, CBC, lipids, TSH now, follow up in 6 months.  Medication Adjustments/Labs and Tests Ordered: Current medicines are reviewed at length with the patient today.  Concerns regarding medicines are outlined above.  Medication changes, Labs and Tests ordered today are listed in the Patient Instructions below. Patient Instructions  Medication Instructions:  Your provider recommends that you continue on your current medications as directed. Please refer to the Current Medication list given to you today.    Labwork: Your provider recommends that you return for FASTING lab work.   Testing/Procedures: Your physician has requested that you have a carotid duplex. This test is an ultrasound of the carotid arteries in your neck. It looks at blood flow through these arteries that supply the brain with blood. Allow one hour for this exam. There are no restrictions or special instructions.  Follow-Up: Your provider wants you to follow-up in: 6 months with [name]. You will receive a reminder letter in the mail two months in advance. If you don't receive a letter, please call our office to schedule the follow-up appointment.     Any Other Special Instructions Will Be Listed Below (If Applicable).     If you need a refill on your cardiac medications before your next appointment, please call your pharmacy.      Signed, [name] Dawley, MD  08/01/2017 10:05 PM    [address], [place], [place]  [address] Phone: [phone]; Fax: [phone]

## 2017-07-30 NOTE — Patient Instructions (Signed)
Medication Instructions:  Your provider recommends that you continue on your current medications as directed. Please refer to the Current Medication list given to you today.    Labwork: Your provider recommends that you return for FASTING lab work.   Testing/Procedures: Your physician has requested that you have a carotid duplex. This test is an ultrasound of the carotid arteries in your neck. It looks at blood flow through these arteries that supply the brain with blood. Allow one hour for this exam. There are no restrictions or special instructions.  Follow-Up: Your provider wants you to follow-up in: 6 months with Dr. Meda Coffee. You will receive a reminder letter in the mail two months in advance. If you don't receive a letter, please call our office to schedule the follow-up appointment.    Any Other Special Instructions Will Be Listed Below (If Applicable).     If you need a refill on your cardiac medications before your next appointment, please call your pharmacy.

## 2017-07-31 HISTORY — PX: COLONOSCOPY: SHX174

## 2017-08-03 ENCOUNTER — Ambulatory Visit (HOSPITAL_COMMUNITY)
Admission: RE | Admit: 2017-08-03 | Discharge: 2017-08-03 | Disposition: A | Discharge status: Home / Self Care | Payer: Medicare Other | Source: Ambulatory Visit | Attending: Cardiovascular Disease | Admitting: Cardiovascular Disease

## 2017-08-03 DIAGNOSIS — R42 Dizziness and giddiness: Secondary | ICD-10-CM | POA: Diagnosis not present

## 2017-08-03 NOTE — Addendum Note (Signed)
Addended by: Marlis Edelson C on: 08/03/2017 02:40 PM   Modules accepted: Orders

## 2017-08-06 ENCOUNTER — Other Ambulatory Visit: Payer: Medicare Other | Admitting: *Deleted

## 2017-08-06 DIAGNOSIS — E785 Hyperlipidemia, unspecified: Secondary | ICD-10-CM

## 2017-08-06 DIAGNOSIS — I2510 Atherosclerotic heart disease of native coronary artery without angina pectoris: Secondary | ICD-10-CM | POA: Diagnosis not present

## 2017-08-06 DIAGNOSIS — R42 Dizziness and giddiness: Secondary | ICD-10-CM | POA: Diagnosis not present

## 2017-08-06 DIAGNOSIS — I10 Essential (primary) hypertension: Secondary | ICD-10-CM | POA: Diagnosis not present

## 2017-08-06 LAB — HEPATIC FUNCTION PANEL
ALT: 24 IU/L (ref 0–32)
AST: 23 IU/L (ref 0–40)
Albumin: 4.4 g/dL (ref 3.5–4.8)
Alkaline Phosphatase: 79 IU/L (ref 39–117)
Bilirubin Total: 0.5 mg/dL (ref 0.0–1.2)
Bilirubin, Direct: 0.16 mg/dL (ref 0.00–0.40)
Total Protein: 6.8 g/dL (ref 6.0–8.5)

## 2017-08-06 LAB — LIPID PANEL
Chol/HDL Ratio: 2.2 ratio (ref 0.0–4.4)
Cholesterol, Total: 125 mg/dL (ref 100–199)
HDL: 56 mg/dL (ref >39)
LDL Calculated: 43 mg/dL (ref 0–99)
Triglycerides: 129 mg/dL (ref 0–149)
VLDL Cholesterol Cal: 26 mg/dL (ref 5–40)

## 2017-08-06 LAB — BASIC METABOLIC PANEL
BUN/Creatinine Ratio: 13 (ref 12–28)
BUN: 15 mg/dL (ref 8–27)
CO2: 23 mmol/L (ref 20–29)
Calcium: 10 mg/dL (ref 8.7–10.3)
Chloride: 102 mmol/L (ref 96–106)
Creatinine, Ser: 1.19 mg/dL — ABNORMAL HIGH (ref 0.57–1.00)
GFR calc Af Amer: 52 mL/min/{1.73_m2} — ABNORMAL LOW (ref >59)
GFR calc non Af Amer: 45 mL/min/{1.73_m2} — ABNORMAL LOW (ref >59)
Glucose: 115 mg/dL — ABNORMAL HIGH (ref 65–99)
Potassium: 4.4 mmol/L (ref 3.5–5.2)
Sodium: 141 mmol/L (ref 134–144)

## 2017-08-17 MED FILL — SHINGRIX 50 MCG SUS: 50 | 1 days supply | Qty: 1 | Fill #1

## 2017-08-20 MED FILL — ROSUVASTATIN CALCIUM 5 MG T: 5 | 30 days supply | Qty: 30 | Fill #5

## 2017-08-20 MED FILL — VIT D2 1.25 MG (50,000 UNIT: 1.25 MG | 84 days supply | Qty: 12 | Fill #0

## 2017-09-01 ENCOUNTER — Encounter: Payer: Self-pay | Admitting: Internal Medicine

## 2017-09-12 DIAGNOSIS — M19041 Primary osteoarthritis, right hand: Secondary | ICD-10-CM | POA: Diagnosis not present

## 2017-09-12 DIAGNOSIS — M19012 Primary osteoarthritis, left shoulder: Secondary | ICD-10-CM | POA: Diagnosis not present

## 2017-09-12 DIAGNOSIS — M459 Ankylosing spondylitis of unspecified sites in spine: Secondary | ICD-10-CM | POA: Diagnosis not present

## 2017-09-12 DIAGNOSIS — M810 Age-related osteoporosis without current pathological fracture: Secondary | ICD-10-CM | POA: Diagnosis not present

## 2017-09-12 DIAGNOSIS — Z1589 Genetic susceptibility to other disease: Secondary | ICD-10-CM | POA: Diagnosis not present

## 2017-09-12 DIAGNOSIS — M19042 Primary osteoarthritis, left hand: Secondary | ICD-10-CM | POA: Diagnosis not present

## 2017-09-12 MED FILL — DICLOFENAC SODIUM 1% GEL: 1 | 25 days supply | Qty: 100 | Fill #0

## 2017-09-15 NOTE — Progress Notes (Signed)
[name] [address], [place] [address] Phone: [phone] Subjective:    I'm seeing this patient by the request  of:    CC: Wrist pain  IRJ:[id number]  [name] is a 72 y.o. female coming in with complaint of left hand pain. States she was working in the yard and the next day she felt throbbing pain in her wrist. Yard work consisted of raking and picking up bags of leaves.  Onset- Friday Location- Distal wrist Duration-  Character- Throbbing Aggravating factors- limited ROM, sleeping Reliving factors- Tylenol Therapies tried-  Severity-initially 9 out of 10 in severity but now much less     Past Medical History:  Diagnosis Date  . Allergy    seasonal  . Ankylosing spondylitis ([place])   . Anxiety   . Arthritis   . Bell palsy   . CTS (carpal tunnel syndrome)   . Cystitis   . Diverticulitis   . Diverticulosis of colon   . Dry eye syndrome   . Frozen shoulder   . GERD (gastroesophageal reflux disease)   . HLA B27 (HLA B27 positive)   . Hx of colonic polyps   . Hyperlipidemia    NMR 2005  . Hypertension   . Hypothyroidism   . Incontinence   . Internal hemorrhoids   . Interstitial cystitis   . Kidney stone   . Menopause   . Osteoarthritis   . Osteopenia    [facility] done [facility]  . S/P total hysterectomy and bilateral salpingo-oophorectomy 04/12/2014  . Sleep apnea   . Subjective visual disturbance of both eyes 12/30/2013  . Tubular adenoma of colon   . Vitamin D deficiency   . Xerostomia    and Xeroophthalmia   Past Surgical History:  Procedure Laterality Date  . ABDOMINAL HYSTERECTOMY    . BLADDER SURGERY     for incontinence  . CARPAL TUNNEL RELEASE Left 12/22/2014   Procedure: LEFT CARPAL TUNNEL RELEASE;  Surgeon: Daryll Brod, MD;  Location: [facility];  Service: Orthopedics;  Laterality: Left;  . COLONOSCOPY W/ POLYPECTOMY     adenomatous poyp 04-2008  . colonscopy     tics  10-2002  . CYSTOSCOPY WITH RETROGRADE PYELOGRAM, URETEROSCOPY AND STENT PLACEMENT Left 09/01/2013   Procedure: CYSTOSCOPY WITH RETROGRADE PYELOGRAM, AND LEFT STENT PLACEMENT;  Surgeon: Molli Hazard, MD;  [facility];  Service: Urology;  Laterality: Left;  . elevated LFTs     due to Elmiron  . FINGER SURGERY Left 2004   2nd finger  . G 1 P 1    . interstitial cystitis     with clot in catheter  . SHOULDER ARTHROSCOPY Left   . TOTAL ABDOMINAL HYSTERECTOMY W/ BILATERAL SALPINGOOPHORECTOMY     dysfunctional menses   Social History   Socioeconomic History  . Marital status: Married    Spouse name: None  . Number of children: 1  . Years of education: BS  . Highest education level: None  Social Needs  . Financial resource strain: None  . Food insecurity - worry: None  . Food insecurity - inability: None  . Transportation needs - medical: None  . Transportation needs - non-medical: None  Occupational History  . Occupation: retired  . Occupation: Pharmacist, hospital    Comment: Kindergarten  Tobacco Use  . Smoking status: Never Smoker  . Smokeless tobacco: Never Used  Substance and Sexual Activity  . Alcohol use: Yes  Comment: occ  . Drug use: No  . Sexual activity: Not Currently  Other Topics Concern  . None  Social History Narrative   Lives   Caffeine use:    Married      Allergies  Allergen Reactions  . Nitroglycerin Anaphylaxis    Hypotensive after NTG administration in ER during chest pain evaluation Pt states she almost died,  Pt states in a procedure done in 10/2016 she had no reaction to this medication, so not sure if it is an actual allergy.   . Pentosan Polysulfate Sodium     Elevated LFTs.......Marland Kitchen elmiron   . Sulfamethoxazole-Trimethoprim Nausea Only    Unknown reaction per pt, Pt thinks she may have felt sick to her stomach.   . Bactrim [Sulfamethoxazole-Trimethoprim]   . Promethazine Hcl     "jittery on the inside"  . Lisinopril Cough   Family  History  Problem Relation Age of Onset  . Coronary artery disease Mother   . Hypertension Mother   . Asthma Mother   . Osteoporosis Mother   . Dementia Mother   . Stroke Mother        mini cva  . Diabetes Father   . Cirrhosis Father        non alcoholic  . Cancer Sister        colon  . Colon cancer Sister 95  . Cancer Paternal Aunt        2 aunts had renal cancer  . Coronary artery disease Maternal Grandfather   . Cancer Maternal Aunt        breast  . Diabetes Paternal Aunt   . Diabetes Paternal Grandmother      Past medical history, social, surgical and family history all reviewed in electronic medical record.  No pertanent information unless stated regarding to the chief complaint.   Review of Systems:Review of systems updated and as accurate as of 09/17/17  No headache, visual changes, nausea, vomiting, diarrhea, constipation, dizziness, abdominal pain, skin rash, fevers, chills, night sweats, weight loss, swollen lymph nodes, chest pain, shortness of breath, mood changes.  Positive body aches, muscle aches, joint swelling  Objective  Blood pressure 124/80, pulse 67, height 5' 2"  (1.575 m), weight 166 lb (75.3 kg), SpO2 98 %. Systems examined below as of 09/17/17   General: No apparent distress alert and oriented x3 mood and affect normal, dressed appropriately.  HEENT: Pupils equal, extraocular movements intact  Respiratory: Patient's speak in full sentences and does not appear short of breath  Cardiovascular: No lower extremity edema, non tender, no erythema  Skin: Warm dry intact with no signs of infection or rash on extremities or on axial skeleton.  Abdomen: Soft nontender  Neuro: Cranial nerves II through XII are intact, neurovascularly intact in all extremities with 2+ DTRs and 2+ pulses.  Lymph: No lymphadenopathy of posterior or anterior cervical chain or axillae bilaterally.  Gait normal with good balance and coordination.  MSK:  Non tender with full range of  motion and good stability and symmetric strength and tone of shoulders, elbows,  hip, knee and ankles bilaterally.  Mild to moderate arthritic changes noted Wrist: The left Inspection reveals mild diffuse swelling of the dorsal aspect of the wrist ROM smooth and normal with good flexion and extension and ulnar/radial deviation that is symmetrical with opposite wrist.  Arthritic changes noted Palpation diffuse discomfort noted No snuffbox tenderness. No tenderness over Canal of [place]. Strength 5/5 in all directions without pain. Negative Finkelstein, tinel's and  phalens. Negative Watson's test. Contralateral wrist unremarkable except for arthritic changes  MSK US performed of: Left wrist  this study was ordered, performed, and interpreted by Charlann Boxer D.O.  Wrist: All extensor compartments show hypoechoic changes with increasing Doppler flow.  True tear appreciated.  Trace effusion of multiple joints noted.  IMPRESSION: Possible wrist sprain versus osteoarthritic flare    Impression and Recommendations:     This case required medical decision making of moderate complexity.      Note: This dictation was prepared with Dragon dictation along with smaller phrase technology. Any transcriptional errors that result from this process are unintentional.

## 2017-09-17 ENCOUNTER — Encounter: Payer: Self-pay | Admitting: Internal Medicine

## 2017-09-17 ENCOUNTER — Ambulatory Visit (INDEPENDENT_AMBULATORY_CARE_PROVIDER_SITE_OTHER): Payer: Medicare Other | Admitting: Family Medicine

## 2017-09-17 ENCOUNTER — Encounter: Payer: Self-pay | Admitting: Family Medicine

## 2017-09-17 ENCOUNTER — Ambulatory Visit: Payer: Self-pay

## 2017-09-17 ENCOUNTER — Ambulatory Visit (INDEPENDENT_AMBULATORY_CARE_PROVIDER_SITE_OTHER): Payer: Medicare Other | Admitting: Internal Medicine

## 2017-09-17 VITALS — BP 124/80 | HR 67 | Ht 62.0 in | Wt 166.0 lb

## 2017-09-17 VITALS — BP 125/65 | HR 65 | Wt 165.6 lb

## 2017-09-17 DIAGNOSIS — M25532 Pain in left wrist: Secondary | ICD-10-CM | POA: Diagnosis not present

## 2017-09-17 DIAGNOSIS — M79642 Pain in left hand: Secondary | ICD-10-CM

## 2017-09-17 DIAGNOSIS — I2510 Atherosclerotic heart disease of native coronary artery without angina pectoris: Secondary | ICD-10-CM

## 2017-09-17 DIAGNOSIS — E559 Vitamin D deficiency, unspecified: Secondary | ICD-10-CM

## 2017-09-17 DIAGNOSIS — M810 Age-related osteoporosis without current pathological fracture: Secondary | ICD-10-CM | POA: Diagnosis not present

## 2017-09-17 MED FILL — ROSUVASTATIN CALCIUM 5 MG T: 5 | 30 days supply | Qty: 30 | Fill #6

## 2017-09-17 NOTE — Patient Instructions (Signed)
Good to see you  Ice is your friend Wear the brace daily for at least a week and may help at night as well  use the pennsaid Try to lift only underhand for a week Read about prolia  [employer] orthotics "total support" online would be great  See me again in 4 weeks

## 2017-09-17 NOTE — Patient Instructions (Addendum)
Please hold Biotin for 1 week and come back for labs.  Please stop Calcium.  Read the following books: Dr. Alyssa Grove - Program for Reversing Diabetes Rip Cyd Silence - The Engine 2 diet   Please return in 6 months.

## 2017-09-17 NOTE — Progress Notes (Addendum)
Patient ID: [name], female   DOB: Dec 10, 1945, 72 y.o.   MRN: [id number]    HPI  [name] is a 72 y.o.-year-old female, initially referred by her Dr. Hilarie Fredrickson, returning for follow-up for osteoporosis, Vitamin D deficiency, hypercalcemia.  Last visit 9 months ago.  Reviewed and addended history: Pt was dx with OP in 2017.  I reviewed pt's DEXA scans - in 2018, T-Scores are better, but still low at 33% distal radius.    L1-L4 T score FN T score 33% distal Radius Ultra distal left radius   10/16/2016 (Hologic)  -0.5 RFN: -1.6 LFN: -1.9 -3.2 -1.9   Previously: Date L1-L4 T score FN T score 33% distal Radius Ultra distal left radius   06/20/2016 (Lunar)  n/a RFN: -1.8 LFN: -2.2 -3.9  -3.6   04/28/2014 (Hologic)  L1, L3, L4: -0.8 (+5.7%*)  RFN: n/a LFN: -1.1 n/a n/a  08/03/2011 (Hologic)  L1, L3, L4: -1.3 (+1.9%)  RFN: n/a LFN: -1.5 n/a n/a   She has a history of avulsion fracture of the left ankle several years ago.  She has some dizziness, but no vertigo/orthostasis/poor vision.  No history of falls.  Previous OP treatments:  - Boniva ~ for 3 years - "years ago" - Forteo-started 11/21/2016 >> stopped at last visit (11/2016) due to history of hypercalcemia >> she feels much better!  She has a distant history of vitamin D deficiency.  More recently, levels have been normal: 11/16/2016: vit D 67 Lab Results  Component Value Date   VD25OH 77.84 06/03/2015   VD25OH 44 07/11/2011   VD25OH 30 04/30/2009   She was on HCTZ at last visit, which was stopped.  We switched to Lasix >> but she is not on this today...  She is on vitamin D 50,000 units weekly. She was on calcium 1200 mg on 11/16/2016, now off.  No weightbearing exercises.  She does not take high vitamin A doses.  She has few steroid tapers and 2 injections in left shoulder in the past  Menopause was at 72 years old.   Pt does have a FH of osteoporosis: mother.  Hypercalcemia: Pt had 2x slightly elevated  calcium in 2018, without a suppressed PTH   She also has a h/o kidney stones x 1 (2015) -she had to have a stent placed.  Reviewed pertinent labs: Lab Results  Component Value Date   CALCIUM 10.0 08/06/2017   CALCIUM 9.8 05/20/2017   CALCIUM 10.0 05/02/2017   CALCIUM 10.4 01/15/2017   CALCIUM 9.8 12/07/2016   CALCIUM 9.8 10/31/2016   CALCIUM 9.4 10/12/2016   CALCIUM 9.5 05/02/2016   CALCIUM 10.0 09/29/2015   CALCIUM 9.8 06/03/2015  11/16/2016: PTH 43, calcium 10.6 (8.5-10.5) 08/07/2016: PTH 64.3 (12-72), calcium 10.3 (8.5-10.2)  Of note, she had a 24-hour urine calcium that was normal, at 156.2 performed by OB/GYN.  No history of thyrotoxicosis. 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 Last BUN/Cr: Lab Results  Component Value Date   BUN 15 08/06/2017   CREATININE 1.19 (H) 08/06/2017   She is on Biotin 2500 mcg daily.  ROS: Constitutional: no weight gain/no weight loss, + fatigue, no subjective hyperthermia, no subjective hypothermia Eyes: no blurry vision, no xerophthalmia ENT: no sore throat, no nodules palpated in throat, no dysphagia,  no odynophagia, no hoarseness Cardiovascular: no CP/no SOB/no palpitations/no leg swelling Respiratory: no cough/no SOB/no wheezing Gastrointestinal: no N/no V/no D/no C/no acid reflux Musculoskeletal: + muscle aches/+ joint aches Skin:+ rash neck (unclear from Forteo >> now resolved), + hair loss Neurological: no tremors/no numbness/no tingling/no dizziness  I reviewed pt's medications, allergies, PMH, social hx, family hx, and changes were documented in the history of present illness. Otherwise, unchanged from my initial visit note. Started Crestor.  Past Medical History:  Diagnosis Date  . Allergy    seasonal  . Ankylosing spondylitis ([place])   . Anxiety   . Arthritis    . Bell palsy   . CTS (carpal tunnel syndrome)   . Cystitis   . Diverticulitis   . Diverticulosis of colon   . Dry eye syndrome   . Frozen shoulder   . GERD (gastroesophageal reflux disease)   . HLA B27 (HLA B27 positive)   . Hx of colonic polyps   . Hyperlipidemia    NMR 2005  . Hypertension   . Hypothyroidism   . Incontinence   . Internal hemorrhoids   . Interstitial cystitis   . Kidney stone   . Menopause   . Osteoarthritis   . Osteopenia    [facility]  . S/P total hysterectomy and bilateral salpingo-oophorectomy 04/12/2014  . Sleep apnea   . Subjective visual disturbance of both eyes 12/30/2013  . Tubular adenoma of colon   . Vitamin D deficiency   . Xerostomia    and Xeroophthalmia   Past Surgical History:  Procedure Laterality Date  . ABDOMINAL HYSTERECTOMY    . BLADDER SURGERY     for incontinence  . CARPAL TUNNEL RELEASE Left 12/22/2014   Procedure: LEFT CARPAL TUNNEL RELEASE;  Surgeon: Daryll Brod, MD;  Location: [facility];  Service: Orthopedics;  Laterality: Left;  . COLONOSCOPY W/ POLYPECTOMY     adenomatous poyp 04-2008  . colonscopy     tics 10-2002  . CYSTOSCOPY WITH RETROGRADE PYELOGRAM, URETEROSCOPY AND STENT PLACEMENT Left 09/01/2013   Procedure: CYSTOSCOPY WITH RETROGRADE PYELOGRAM, AND LEFT STENT PLACEMENT;  Surgeon: Molli Hazard, MD;  Location: [facility];  Service: Urology;  Laterality: Left;  . elevated LFTs     due to Elmiron  . FINGER SURGERY Left 2004   2nd finger  . G 1 P 1    . interstitial cystitis     with clot in catheter  . SHOULDER ARTHROSCOPY Left   . TOTAL ABDOMINAL HYSTERECTOMY W/ BILATERAL SALPINGOOPHORECTOMY     dysfunctional menses   Social History   Social History  . Marital status: Married    Spouse name: N/A  . Number of children: 1  . Years of education: BS   Occupational History  . 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 teacher     Kindergarten   Social History Main Topics  . Smoking status: Never Smoker  . Smokeless tobacco: Never Used  . Alcohol use Yes     Comment: occ  . Drug use: No  . Sexual activity: Not Currently   Other Topics Concern  . Not on file   Social History Narrative   Married         Current Outpatient Medications on File Prior to Visit  Medication Sig Dispense Refill  . acetaminophen (TYLENOL) 500 MG tablet Take 500  mg by mouth every 4 (four) hours as needed for mild pain.     . Ascorbic Acid (VITAMIN C) 1000 MG tablet Take 1,000 mg by mouth daily.     Marland Kitchen aspirin 81 MG tablet Take 81 mg by mouth daily.      Marland Kitchen BIOTIN PO Take 1 tablet by mouth daily.    . blood glucose meter kit and supplies KIT Dispense based on patient and insurance preference. Use up to four times daily as directed. prediabetes 1 each 0  . Calcium Carbonate-Vit D-Min (CALCIUM 1200 PO) Take 600 mg by mouth daily.     . cetirizine (ZYRTEC) 10 MG tablet Take 10 mg by mouth daily.      . Diclofenac Sodium 2 % SOLN Place onto the skin as directed for pain as needed.    . hyoscyamine (LEVSIN SL) 0.125 MG SL tablet Place 1 tablet (0.125 mg total) under the tongue every 4 (four) hours as needed. 15 tablet 1  . MAGNESIUM PO Take 1 capsule by mouth daily. With vitamin B6    . meloxicam (MOBIC) 15 MG tablet Take 1 tablet (15 mg total) by mouth daily as needed for pain. With food 90 tablet 2  . metoprolol succinate (TOPROL XL) 25 MG 24 hr tablet Take 1 tablet (25 mg total) by mouth daily. 90 tablet 3  . Misc Natural Products (COSAMIN ASU ADVANCED FORMULA PO) Take 2 each by mouth daily.    . Omega-3 Fatty Acids (FISH OIL BURP-LESS) 1000 MG CAPS Takes 1000 mg BID 60 capsule 0  . omeprazole (PRILOSEC) 40 MG capsule Take 1 capsule (40 mg total) by mouth daily. 30 capsule 5  . ondansetron (ZOFRAN) 4 MG tablet Take 1 tablet (4 mg total) by mouth every 6 (six) hours. 12 tablet 0  . Polyethyl  Glycol-Propyl Glycol (SYSTANE OP) Apply 1 drop to eye as needed. Apply to both eyes as needed    . Probiotic Product (VSL#3) CAPS Take 1 capsule by mouth once daily 30 capsule 5  . rosuvastatin (CRESTOR) 5 MG tablet Take 1 tablet (5 mg total) by mouth daily. 90 tablet 2  . Teriparatide, Recombinant, (FORTEO [place]) Inject into the skin.    Marland Kitchen vitamin B-12 (CYANOCOBALAMIN) 1000 MCG tablet Take 1,000 mcg by mouth daily.     . Vitamin D, Ergocalciferol, (DRISDOL) 50000 units CAPS capsule Take 1 capsule (50,000 Units total) by mouth every 7 (seven) days. 12 capsule 0  . vitamin E 400 UNIT capsule Take 400 Units by mouth daily.     No current facility-administered medications on file prior to visit.    Allergies  Allergen Reactions  . Nitroglycerin Anaphylaxis    Hypotensive after NTG administration in ER during chest pain evaluation Pt states she almost died,  Pt states in a procedure done in 10/2016 she had no reaction to this medication, so not sure if it is an actual allergy.   . Pentosan Polysulfate Sodium     Elevated LFTs.......Marland Kitchen elmiron   . Sulfamethoxazole-Trimethoprim Nausea Only    Unknown reaction per pt, Pt thinks she may have felt sick to her stomach.   . Bactrim [Sulfamethoxazole-Trimethoprim]   . Promethazine Hcl     "jittery on the inside"  . Lisinopril Cough   Family History  Problem Relation Age of Onset  . Coronary artery disease Mother   . Hypertension Mother   . Asthma Mother   . Osteoporosis Mother   . Dementia Mother   . Stroke Mother  mini cva  . Diabetes Father   . Cirrhosis Father        non alcoholic  . Cancer Sister        colon  . Colon cancer Sister 72  . Cancer Paternal Aunt        2 aunts had renal cancer  . Coronary artery disease Maternal Grandfather   . Cancer Maternal Aunt        breast  . Diabetes Paternal Aunt   . Diabetes Paternal Grandmother     PE: BP 125/65 (BP Location: Left Arm, Patient Position: Sitting, Cuff Size: Normal)    Pulse 65   Wt 165 lb 9.6 oz (75.1 kg)   BMI 30.29 kg/m  Wt Readings from Last 3 Encounters:  09/17/17 165 lb 9.6 oz (75.1 kg)  09/17/17 166 lb (75.3 kg)  07/30/17 166 lb (75.3 kg)   Constitutional: overweight, in NAD Eyes: PERRLA, EOMI, no exophthalmos ENT: moist mucous membranes, no thyromegaly, no cervical lymphadenopathy Cardiovascular: RRR, No MRG Respiratory: CTA B Gastrointestinal: abdomen soft, NT, ND, BS+ Musculoskeletal: no deformities, strength intact in all 4 Skin: moist, warm, no rashes Neurological: no tremor with outstretched hands, DTR normal in all 4  Assessment: 1. Osteoporosis  2. Hypercalcemia  3. Vit D def  Plan: 1. Osteoporosis -Possibly postmenopausal, but she also has family history of osteoporosis and has been on steroid injections and prednisone courses.  It is possible that she also has mild primary hyperparathyroidism. Her latest calcium levels were normal after we stopped HCTZ. She is still on calcium 600 mg daily >> advised to stop.  -We reviewed together her latest bone density scan, that was improved although a direct comparison could not be done since the last 2 bone density scans were checked on different machines. -She was previously on Boniva for 3 years which she tolerated well but had to take acid reflux medications.  At last visit, she was recently started on teriparatide, but she was not tolerating this well, due to dizziness, headache, and constipation.  This could all have been side effects to teriparatide.  Also, as she had a history of hypercalcemia without a suppressed PTH, I advised her to stop the medication until we investigated further her hypercalcemia.  The plan was to bridge her with Fosamax until the investigation of her hypercalcemia was complete.  However, she did not return for labs.  We decided to possibly continue with Prolia after the results returned.  -At this visit, we again discussed about the different medication classes,  benefits, side effects (including atypical fractures and ONJ - no dental workup in progress or planned).  -We plan to check another DXA scan in 2 years after starting treatment >> we decided for Prolia - I explained that the first indication that the treatment is working is her not having fractures. DXA scan changes are secondary: unchanged or slightly higher T-scores are desirable - will see pt back in 6 months.  2. Hypercalcemia -Patient had 2 instances of elevated calcium level before starting teriparatide, and the presence of an nonsuppressed PTH >> At last visit, I advised her to stop Forteo (teriparatide) since hypercalcemia/hyperparathyroidism is a contraindication to this.  We planned to check her for primary hyperparathyroidism.  The fact that her 33% distal radius bone density was the lowest and the fact that she had kidney stones would make the diagnosis more probable.  At last visit, we also stopped HCTZ and switch to furosemide to avoid further hypercalcemia and permit  hyperparathyroidism investigation.  I advised her to come back in 2 weeks for further investigation  We did discuss that if she had primary hyperparathyroidism, especially in the presence of osteoporosis and kidney stones, I would suggest parathyroidectomy.  She agreed to a referral to surgery in that case.  However, she did not come back for the below labs: Orders   Procedures  . Vitamin D 1,25 dihydroxy  . Phosphorus  . Parathyroid hormone, intact (no Ca)  . Magnesium  . BASIC METABOLIC PANEL WITH GFR  . Calcium, ionized  -However, reviewing no recent calcium levels, these have normalized, so it is possible that her hypercalcemia was also contributed to by her HCTZ and calcium supplements -At this visit, we planned to check the above tests  - but she is on Biotin >> will stop for 1 week as this can affect PTH level  3. H/o Vitamin D deficiency -Most recent vitamin D levels have been normal -now on 50,000 units  weekly -We will recheck level in 1 week  - time spent with the patient: 40 min, of which >50% was spent in obtaining information about her symptoms, reviewing her previous labs, evaluations, and treatments, counseling her about her conditions (please see the discussed topics above), and developing a plan to further investigate and treat them; she had a number of questions which I addressed.  Component     Latest Ref Rng & Units 09/24/2017  Glucose     65 - 99 mg/dL 102 (H)  BUN     7 - 25 mg/dL 19  Creatinine     0.60 - 0.93 mg/dL 1.06 (H)  GFR, Est Non African American     > OR = 60 mL/min/1.40m 52 (L)  GFR, Est African American     > OR = 60 mL/min/1.77m60  BUN/Creatinine Ratio     6 - 22 (calc) 18  Sodium     135 - 146 mmol/L 142  Potassium     3.5 - 5.3 mmol/L 4.4  Chloride     98 - 110 mmol/L 108  CO2     20 - 32 mmol/L 25  Calcium     8.6 - 10.4 mg/dL 9.5  Magnesium     1.5 - 2.5 mg/dL 2.0  Calcium Ionized     4.8 - 5.6 mg/dL 5.4  PTH, Intact     15 - 65 pg/mL 36  Phosphorus     2.3 - 4.6 mg/dL 3.8   Labs are normal except slight increase in sugars and creatinine, but this is improved.c/w before. Will start a PA for Prolia.  Vitamin D 1, 25 (OH) Total     18 - 72 pg/mL 41  Vitamin D3 1, 25 (OH)     pg/mL 21  Vitamin D2 1, 25 (OH)     pg/mL 20  Calcitriol level also normal.  CrPhilemon KingdomMD PhD [facility]

## 2017-09-17 NOTE — Assessment & Plan Note (Signed)
Acute on chronic wrist discomfort.  Likely secondary to more of a tendinitis, as well as underlying exacerbation of arthritis.  I do believe the patient does have an autoimmune osteoarthritic changes as well.  We discussed icing regimen, bracing, home exercises given.  Follow-up again in 4 weeks

## 2017-09-24 ENCOUNTER — Other Ambulatory Visit (INDEPENDENT_AMBULATORY_CARE_PROVIDER_SITE_OTHER): Payer: Medicare Other

## 2017-09-24 LAB — PHOSPHORUS: Phosphorus: 3.8 mg/dL (ref 2.3–4.6)

## 2017-09-24 LAB — MAGNESIUM: Magnesium: 2 mg/dL (ref 1.5–2.5)

## 2017-09-24 MED FILL — OMEPRAZOLE DR 40 MG CAPSULE: 40 | 30 days supply | Qty: 30 | Fill #3

## 2017-09-24 NOTE — Addendum Note (Signed)
Addended by: Kaylyn Lim I on: 09/24/2017 10:06 AM   Modules accepted: Orders

## 2017-09-24 NOTE — Addendum Note (Signed)
Addended by: Kaylyn Lim I on: 09/24/2017 10:05 AM   Modules accepted: Orders

## 2017-09-25 LAB — PARATHYROID HORMONE, INTACT (NO CA): PTH: 36 pg/mL (ref 15–65)

## 2017-09-25 LAB — SPECIMEN STATUS REPORT

## 2017-09-26 LAB — BASIC METABOLIC PANEL WITH GFR
BUN / CREAT RATIO: 18 (calc) (ref 6–22)
BUN: 19 mg/dL (ref 7–25)
CHLORIDE: 108 mmol/L (ref 98–110)
CO2: 25 mmol/L (ref 20–32)
Calcium: 9.5 mg/dL (ref 8.6–10.4)
Creat: 1.06 mg/dL — ABNORMAL HIGH (ref 0.60–0.93)
GFR, EST AFRICAN AMERICAN: 60 mL/min/{1.73_m2} (ref >=60)
GFR, Est Non African American: 52 mL/min/{1.73_m2} — ABNORMAL LOW (ref >=60)
Glucose, Bld: 102 mg/dL — ABNORMAL HIGH (ref 65–99)
POTASSIUM: 4.4 mmol/L (ref 3.5–5.3)
Sodium: 142 mmol/L (ref 135–146)

## 2017-09-26 LAB — VITAMIN D 1,25 DIHYDROXY
VITAMIN D2 1, 25 (OH): 20 pg/mL
Vitamin D 1, 25 (OH)2 Total: 41 pg/mL (ref 18–72)
Vitamin D3 1, 25 (OH)2: 21 pg/mL

## 2017-09-26 LAB — CALCIUM, IONIZED: Calcium, Ion: 5.4 mg/dL (ref 4.8–5.6)

## 2017-10-03 DIAGNOSIS — R350 Frequency of micturition: Secondary | ICD-10-CM | POA: Diagnosis not present

## 2017-10-03 DIAGNOSIS — N3945 Continuous leakage: Secondary | ICD-10-CM | POA: Diagnosis not present

## 2017-10-03 DIAGNOSIS — N951 Menopausal and female climacteric states: Secondary | ICD-10-CM | POA: Diagnosis not present

## 2017-10-03 DIAGNOSIS — R351 Nocturia: Secondary | ICD-10-CM | POA: Diagnosis not present

## 2017-10-09 ENCOUNTER — Encounter: Payer: Self-pay | Admitting: Internal Medicine

## 2017-10-09 ENCOUNTER — Ambulatory Visit (INDEPENDENT_AMBULATORY_CARE_PROVIDER_SITE_OTHER): Payer: Medicare Other | Admitting: Internal Medicine

## 2017-10-09 VITALS — BP 120/72 | HR 60 | Ht 62.0 in | Wt 163.8 lb

## 2017-10-09 DIAGNOSIS — R198 Other specified symptoms and signs involving the digestive system and abdomen: Secondary | ICD-10-CM

## 2017-10-09 DIAGNOSIS — K580 Irritable bowel syndrome with diarrhea: Secondary | ICD-10-CM

## 2017-10-09 DIAGNOSIS — I2510 Atherosclerotic heart disease of native coronary artery without angina pectoris: Secondary | ICD-10-CM

## 2017-10-09 DIAGNOSIS — K219 Gastro-esophageal reflux disease without esophagitis: Secondary | ICD-10-CM | POA: Diagnosis not present

## 2017-10-09 DIAGNOSIS — R140 Abdominal distension (gaseous): Secondary | ICD-10-CM | POA: Diagnosis not present

## 2017-10-09 DIAGNOSIS — R151 Fecal smearing: Secondary | ICD-10-CM

## 2017-10-09 MED ORDER — RIFAXIMIN 550 MG PO TABS: 550.0000 mg | ORAL_TABLET | Freq: Three times a day (TID) | ORAL | 0 refills | Status: DC

## 2017-10-09 NOTE — Progress Notes (Signed)
Subjective:    Patient ID: [name], female    DOB: [birth year], 72 y.o.   MRN: [id number]  HPI [name] is a 72 year old female with a history of adenomatous colon polyps, colonic diverticulosis and diverticulitis, IBS, GERD who is here for follow-up.  Last seen 04/25/2017.  Here alone today.  Also history of HLA-B27 ankylosing spondylitis, arthritis, hypertension, hyperlipidemia, hypothyroidism, osteoporosis and vitamin D deficiency.  She made follow-up appointment to discuss issues with loose stools, abdominal bloating, and intermittent fecal incontinence.  This is been worse particularly in the last 3 weeks perhaps as much as a month.  She reports that she has had increasing stool frequency which is often loose.  Can go 3 or 4 times per day.  At times she will have some fecal seepage and smearing during sleep with no warning to get up for bowel movement.  She says at times she has loose stools almost as if she were prepping for colonoscopy.  She reports increasing borborygmi and occasional abdominal bloating.  She also finds it hard to clean after bowel movement and reports feeling the need to "keep wiping because there is fecal matter in the anal area".  She has not had blood in her stool or melena.  No abdominal pain.  She has continued align daily as a probiotic and has been on her daily PPI which has been controlling her reflux symptoms.  She is using omeprazole 40 mg daily.  No further nausea or sour belching.  Her last colonoscopy was performed on 01/27/2016.  This revealed perianal skin tags, 7 polyps which were removed the largest being 8 mm.  Multiple diverticula in the sigmoid colon and internal hemorrhoids which were small.  These polyps were adenomatous.  She did start high-dose vitamin D about a month ago.  Her last cross-sectional imaging was in July 2018 which showed no acute findings.  Diverticulosis without inflammation and a moderate cystocele.  She has been seen by Dr.  Lorenza Cambridge at [facility] rheumatology.  Her opinion was that of noninflammatory osteoarthritis rather than ankylosing spondylitis.  She recommended Voltaren gel.  Review of Systems As per HPI, otherwise negative  Current Medications, Allergies, Past Medical History, Past Surgical History, Family History and Social History were reviewed in [employer] record.     Objective:   Physical Exam BP 120/72   Pulse 60   Ht 5' 2"  (1.575 m)   Wt 163 lb 12.8 oz (74.3 kg)   BMI 29.96 kg/m  Constitutional: Well-developed and well-nourished. No distress. HEENT: Normocephalic and atraumatic. Conjunctivae are normal.  No scleral icterus. Neck: Neck supple. Trachea midline. Cardiovascular: Normal rate, regular rhythm and intact distal pulses.  Pulmonary/chest: Effort normal and breath sounds normal.  Abdominal: Soft, nontender, nondistended. Bowel sounds active throughout.  Rectal: Normal external exam, nontender, no masses, very soft brown stool in the vault, heme-negative, decreased rectal tone Extremities: no clubbing, cyanosis, or edema Neurological: Alert and oriented to person place and time. Skin: Skin is warm and dry. Psychiatric: Normal mood and affect. Behavior is normal.      Assessment & Plan:  72 year old female with a history of adenomatous colon polyps, colonic diverticulosis and diverticulitis, IBS, GERD who is here for follow-up.  1. Loose stools/fecal incontinence and fecal smearing/IBS/borborygmi and bloating --symptoms most consistent with flare of IBS.  No recent antibiotics and findings not consistent with infectious etiology.  Reassurance provided.  Rifaximin 550 mg 3 times daily times 14 days.  Hold align while on rifaximin.  Follow-up with me in 2 months and if no improvement we can consider further evaluation including repeat colonoscopy.  Regarding fecal smearing there is likely a component of pelvic floor dysfunction.  We will address this more  wants bowel movements improved.  Consider anorectal manometry and pelvic floor physical therapy.  2.  GERD --well-controlled with omeprazole.  Continue omeprazole 40 mg daily  25 minutes spent with the patient today. Greater than 50% was spent in counseling and coordination of care with the patient

## 2017-10-09 NOTE — Patient Instructions (Addendum)
We have sent the following medications to your pharmacy for you to pick up at your convenience: Xifaxan 550 mg three times daily x 14 days  Hold Align for now.  Continue your omeprazole.  Please follow up with Dr Hilarie Fredrickson in May or June 2019.  If you are age 74 or older, your body mass index should be between 23-30. Your Body mass index is 29.96 kg/m. If this is out of the aforementioned range listed, please consider follow up with your Primary Care Provider.  If you are age 14 or younger, your body mass index should be between 19-25. Your Body mass index is 29.96 kg/m. If this is out of the aformentioned range listed, please consider follow up with your Primary Care Provider.

## 2017-10-12 ENCOUNTER — Telehealth: Payer: Self-pay | Admitting: *Deleted

## 2017-10-12 NOTE — Telephone Encounter (Signed)
Pyrtle ordered the Xifaxan , not me - on review of note would try to get her enough samples to take a 2 week course three times daily

## 2017-10-12 NOTE — Telephone Encounter (Signed)
[employer] (through [employer]) has denied coverage for Xifaxan 550 three times daily for patient because she has not yet had failure, intolerance, or contraindication to at least one tricyclic antidepressant to relieve abdominal pain (I.e. Amitriptyline, despiramine, doxepine, imipramine, nortriptyline).  Please advise.Marland KitchenMarland KitchenMarland Kitchen

## 2017-10-14 NOTE — Progress Notes (Signed)
[name] [address] [place], [place] [address] Phone: [phone] Subjective:     CC: Left shoulder pain  TWS:[id number]  [name] is a 72 y.o. female coming in with complaint of wrist pain which has improved since last visit. She is complaining of her left shoulder pain in which she said it feels like the bone is breaking. Denies any radiating symptoms into the hand but is in constant pain in her shoulder.   Known arthritic changes in the left shoulder.  Patient is having worsening pain.  States that there is not a time where she does not notices her shoulder.  With any type of lifting or movement causes severe pain at the moment.  9 out of 10.     Past Medical History:  Diagnosis Date  . Allergy    seasonal  . Ankylosing spondylitis ([place])   . Anxiety   . Arthritis   . Bell palsy   . CTS (carpal tunnel syndrome)   . Cystitis   . Diverticulitis   . Diverticulosis of colon   . Dry eye syndrome   . Frozen shoulder   . GERD (gastroesophageal reflux disease)   . HLA B27 (HLA B27 positive)   . Hx of colonic polyps   . Hyperlipidemia    NMR 2005  . Hypertension   . Hypothyroidism   . Incontinence   . Internal hemorrhoids   . Interstitial cystitis   . Kidney stone   . Menopause   . Osteoarthritis   . Osteopenia    [facility]  . S/P total hysterectomy and bilateral salpingo-oophorectomy 04/12/2014  . Sleep apnea   . Subjective visual disturbance of both eyes 12/30/2013  . Tubular adenoma of colon   . Vitamin D deficiency   . Xerostomia    and Xeroophthalmia   Past Surgical History:  Procedure Laterality Date  . ABDOMINAL HYSTERECTOMY    . BLADDER SURGERY     for incontinence  . CARPAL TUNNEL RELEASE Left 12/22/2014   Procedure: LEFT CARPAL TUNNEL RELEASE;  Surgeon: Daryll Brod, MD;  Location: [facility];  Service: Orthopedics;  Laterality: Left;  . COLONOSCOPY W/ POLYPECTOMY     adenomatous  poyp 04-2008  . colonscopy     tics 10-2002  . CYSTOSCOPY WITH RETROGRADE PYELOGRAM, URETEROSCOPY AND STENT PLACEMENT Left 09/01/2013   Procedure: CYSTOSCOPY WITH RETROGRADE PYELOGRAM, AND LEFT STENT PLACEMENT;  Surgeon: Molli Hazard, MD;  Location: [facility];  Service: Urology;  Laterality: Left;  . elevated LFTs     due to Elmiron  . FINGER SURGERY Left 2004   2nd finger  . G 1 P 1    . interstitial cystitis     with clot in catheter  . SHOULDER ARTHROSCOPY Left   . TOTAL ABDOMINAL HYSTERECTOMY W/ BILATERAL SALPINGOOPHORECTOMY     dysfunctional menses   Social History   Socioeconomic History  . Marital status: Married    Spouse name: None  . Number of children: 1  . Years of education: BS  . Highest education level: None  Social Needs  . Financial resource strain: None  . Food insecurity - worry: None  . Food insecurity - inability: None  . Transportation needs - medical: None  . Transportation needs - non-medical: None  Occupational History  . Occupation: retired  . Occupation: Pharmacist, hospital    Comment: Kindergarten  Tobacco Use  . Smoking status: Never Smoker  .  Smokeless tobacco: Never Used  Substance and Sexual Activity  . Alcohol use: Yes    Comment: occ  . Drug use: No  . Sexual activity: Not Currently  Other Topics Concern  . None  Social History Narrative   Lives   Caffeine use:    Married      Allergies  Allergen Reactions  . Nitroglycerin Anaphylaxis    Hypotensive after NTG administration in ER during chest pain evaluation Pt states she almost died,  Pt states in a procedure done in 10/2016 she had no reaction to this medication, so not sure if it is an actual allergy.   . Pentosan Polysulfate Sodium     Elevated LFTs.......Marland Kitchen elmiron   . Sulfamethoxazole-Trimethoprim Nausea Only    Unknown reaction per pt, Pt thinks she may have felt sick to her stomach.   . Bactrim [Sulfamethoxazole-Trimethoprim]   . Promethazine Hcl     "jittery on the inside"    . Lisinopril Cough   Family History  Problem Relation Age of Onset  . Coronary artery disease Mother   . Hypertension Mother   . Asthma Mother   . Osteoporosis Mother   . Dementia Mother   . Stroke Mother        mini cva  . Diabetes Father   . Cirrhosis Father        non alcoholic  . Cancer Sister        colon  . Colon cancer Sister 39  . Cancer Paternal Aunt        2 aunts had renal cancer  . Coronary artery disease Maternal Grandfather   . Cancer Maternal Aunt        breast  . Diabetes Paternal Aunt   . Diabetes Paternal Grandmother      Past medical history, social, surgical and family history all reviewed in electronic medical record.  No pertanent information unless stated regarding to the chief complaint.   Review of Systems:Review of systems updated and as accurate as of 10/15/17  No headache, visual changes, nausea, vomiting, diarrhea, constipation, dizziness, abdominal pain, skin rash, fevers, chills, night sweats, weight loss, swollen lymph nodes, body aches, joint swelling,  chest pain, shortness of breath, mood changes.  Positive muscle aches  Objective  Blood pressure 124/82, pulse 67, height 5' 2" (1.575 m), weight 166 lb (75.3 kg), SpO2 97 %. Systems examined below as of 10/15/17   General: No apparent distress alert and oriented x3 mood and affect normal, dressed appropriately.  HEENT: Pupils equal, extraocular movements intact  Respiratory: Patient's speak in full sentences and does not appear short of breath  Cardiovascular: No lower extremity edema, non tender, no erythema  Skin: Warm dry intact with no signs of infection or rash on extremities or on axial skeleton.  Abdomen: Soft nontender  Neuro: Cranial nerves II through XII are intact, neurovascularly intact in all extremities with 2+ DTRs and 2+ pulses.  Lymph: No lymphadenopathy of posterior or anterior cervical chain or axillae bilaterally.  Gait normal with good balance and coordination.  MSK:   Non tender with full range of motion and good stability and symmetric strength and tone of  elbows, wrist, hip, knee and ankles bilaterally.   Mild arthritic changes of multiple joints Patient's left shoulder has decreasing range of motion in all planes with crepitus noted.  Patient has only active forward flexion of 70 degrees.  3-5 strength compared to the contralateral side  Procedure: Real-time Ultrasound Guided Injection of left glenohumeral  joint Device: GE Logiq E  Ultrasound guided injection is preferred based studies that show increased duration, increased effect, greater accuracy, decreased procedural pain, increased response rate with ultrasound guided versus blind injection.  Verbal informed consent obtained.  Time-out conducted.  Noted no overlying erythema, induration, or other signs of local infection.  Skin prepped in a sterile fashion.  Local anesthesia: Topical Ethyl chloride.  With sterile technique and under real time ultrasound guidance:  Joint visualized.  21g 2 inch needle inserted posterior approach. Pictures taken for needle placement. Patient did have injection of 2 cc of 0.5% Marcaine, and 1cc of Kenalog 40 mg/dL. Completed without difficulty  Pain immediately resolved suggesting accurate placement of the medication.  Advised to call if fevers/chills, erythema, induration, drainage, or persistent bleeding.  Images permanently stored and available for review in the ultrasound unit.  Impression: Technically successful ultrasound guided injection.     Impression and Recommendations:     This case required medical decision making of moderate complexity.      Note: This dictation was prepared with Dragon dictation along with smaller phrase technology. Any transcriptional errors that result from this process are unintentional.

## 2017-10-15 ENCOUNTER — Ambulatory Visit (INDEPENDENT_AMBULATORY_CARE_PROVIDER_SITE_OTHER): Payer: Medicare Other | Admitting: Family Medicine

## 2017-10-15 ENCOUNTER — Encounter: Payer: Self-pay | Admitting: Family Medicine

## 2017-10-15 ENCOUNTER — Ambulatory Visit: Payer: Self-pay

## 2017-10-15 VITALS — BP 124/82 | HR 67 | Ht 62.0 in | Wt 166.0 lb

## 2017-10-15 DIAGNOSIS — G8929 Other chronic pain: Secondary | ICD-10-CM

## 2017-10-15 DIAGNOSIS — I2510 Atherosclerotic heart disease of native coronary artery without angina pectoris: Secondary | ICD-10-CM | POA: Diagnosis not present

## 2017-10-15 DIAGNOSIS — M25512 Pain in left shoulder: Secondary | ICD-10-CM | POA: Diagnosis not present

## 2017-10-15 DIAGNOSIS — M19012 Primary osteoarthritis, left shoulder: Secondary | ICD-10-CM | POA: Diagnosis not present

## 2017-10-15 NOTE — Telephone Encounter (Signed)
Patient has been advised that her insurance wants her to try tricyclic antidepressant prior to approving Xifaxan, however this is not appropriate for her. Therefore, we will wait until we get samples of xifaxan and will provide those for her at that time. She verbalizes understanding.

## 2017-10-15 NOTE — Patient Instructions (Signed)
Good to see you  Alvera Singh is your friend.  Malena Catholic would be someone to look into  I can repeat injection every 10 weeks See you soon.

## 2017-10-15 NOTE — Assessment & Plan Note (Signed)
Patient given injection.  Tolerated the procedure well.  Discussed icing regimen and home exercises.  Discussed which activities to do which wants to avoid.  Increase activity slowly over the course the next several days.  Follow-up again 10 weeks

## 2017-10-15 NOTE — Telephone Encounter (Signed)
Left voicemail for patient to call back. We currently have no samples of Xifaxan but I will save samples as we receive them until I get enough for her to have a full course.

## 2017-10-15 NOTE — Telephone Encounter (Signed)
Patient calling to state medication xifaxan has still not been approved and wants to know what to do.

## 2017-10-18 DIAGNOSIS — Z1231 Encounter for screening mammogram for malignant neoplasm of breast: Secondary | ICD-10-CM | POA: Diagnosis not present

## 2017-10-18 LAB — HM MAMMOGRAPHY

## 2017-10-19 ENCOUNTER — Encounter: Payer: Self-pay | Admitting: Internal Medicine

## 2017-10-22 ENCOUNTER — Other Ambulatory Visit: Payer: Self-pay | Admitting: Cardiology

## 2017-10-22 DIAGNOSIS — R072 Precordial pain: Secondary | ICD-10-CM

## 2017-10-22 DIAGNOSIS — I10 Essential (primary) hypertension: Secondary | ICD-10-CM

## 2017-10-22 MED FILL — raNITIdine HCL 150 MG TABS: 150 | 30 days supply | Qty: 60 | Fill #2

## 2017-10-22 MED FILL — ROSUVASTATIN CALCIUM 5 MG T: 5 | 30 days supply | Qty: 30 | Fill #7

## 2017-10-23 MED FILL — METOPROLOL SUCCINATE ER 25: 25 | 90 days supply | Qty: 90 | Fill #0

## 2017-10-24 NOTE — Telephone Encounter (Signed)
Left voicemail advising patient that I have 1/2 course of samples right now and will let her know as soon as I obtain the other half of those samples. Explained that I have not forgotten about her but that I have to share samples with the entire office when they come in. Asked that she call back with further questions should she have any.

## 2017-10-24 NOTE — Telephone Encounter (Signed)
Pt is wanting to follow up about Xifaxin samples.  Best call back [phone].

## 2017-10-30 NOTE — Telephone Encounter (Signed)
I have spoken to patient to advise that I have full 42 tablet sample course of Xifaxan at front desk waiting for pick up at this time. She verbalizes understanding.

## 2017-10-31 NOTE — Progress Notes (Signed)
Subjective:    Patient ID: [name], female    DOB: 1946/06/04, 72 y.o.   MRN: [id number]  HPI The patient is here for follow up.  Lightheadedness/dizziness - balance problems:  She sometimes feels lightheaded or unbalanced.  She thinks this started after starting the crestor. She tried changing the crestor from daily to three days a week and is taking it in the evening.    Hypertension: She is taking her medication daily. She is compliant with a low sodium diet.  She denies chest pain, palpitations, shortness of breath and regular headaches. She is not exercising regularly.       Hyperlipidemia: She is taking her medication daily. She is compliant with a low fat/cholesterol diet. She is not exercising regularly. She denies myalgias.   Diabetes: She is taking her medication daily as prescribed. She is more compliant with a diabetic diet. She is not exercising regularly. She checks her feet daily and denies foot lesions. She is up-to-date with an ophthalmology examination.   Osteoporosis:  She is not currently taking any medication for her bones.  She is taking calcium and vitamin d.   She is active, but not exercising regularly.  She did not tolerate Forteo and does not want to try 1 of the other medications.   Medications and allergies reviewed with patient and updated if appropriate.  Patient Active Problem List   Diagnosis Date Noted  . Hypercalcemia 09/17/2017  . Left wrist pain 09/17/2017  . Synovitis of left knee 05/22/2017  . Diabetes ([place]) 05/02/2017  . Trigger point of left shoulder region 03/27/2017  . Taste disorder 02/22/2017  . Anxiety 02/12/2017  . Caregiver with fatigue 01/17/2017  . Medication monitoring encounter 01/17/2017  . Psychosocial stressors 01/17/2017  . Fatigue 01/15/2017  . Decreased sense of taste 01/15/2017  . Metallic taste 78/29/5621  . At high risk for falls 11/16/2016  . Family history of osteoporosis 11/16/2016  . Atherosclerotic plaque  11/07/2016  . Chest pain 09/21/2016  . 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 Renal insufficiency 06/29/2016  . RUQ discomfort 06/29/2016  . Osteoporosis 06/21/2016  . Dizziness 05/16/2016  . Arthritis of left shoulder region 01/17/2016  . Ankylosing spondylitis ([place]) 11/08/2015  . Incontinence 10/28/2015  . Thoracic back pain 10/19/2015  . Leg cramping 09/29/2015  . 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 Impingement syndrome of left shoulder region 09/22/2014  . Interstitial cystitis 07/09/2014  . Diverticulosis of colon without hemorrhage 07/08/2014  . Carpal tunnel syndrome of left wrist 04/12/2014  . Food allergy 03/06/2014  . Renal calculi 01/30/2014  . OSA (obstructive sleep apnea), moderate 01/14/2014  . Visual changes 12/17/2013  . Seasonal and perennial allergic rhinitis 12/06/2013  . Seasonal allergic conjunctivitis 12/06/2013  . History of Bell's palsy 02/26/2012  . Vitamin D deficiency 06/27/2010  . 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  . DRY MOUTH 06/27/2010  . ABDOMINAL PAIN, SUPRAPUBIC 05/25/2009  . Gastroesophageal reflux disease 04/30/2009  . DIVERTICULOSIS, COLON 04/30/2009  . History of colonic polyps 04/30/2009  . COSTOCHONDRITIS 01/15/2008  . HYPERLIPIDEMIA 08/21/2007  . Essential hypertension 08/21/2007  . ARTHRALGIA 08/21/2007  . Chronic lumbar radiculopathy 01/25/2007    Current Outpatient Medications on File Prior to Visit  Medication Sig Dispense Refill  . acetaminophen (TYLENOL) 500 MG tablet Take 500 mg by mouth every 4 (four) hours as needed for mild pain.     . Ascorbic Acid (VITAMIN C) 1000 MG  tablet Take 1,000 mg by mouth daily.     Marland Kitchen aspirin 81 MG tablet Take 81 mg by mouth daily.      Marland Kitchen BIOTIN PO Take 1 tablet by mouth daily.    . blood glucose meter kit and supplies KIT Dispense based on patient and insurance preference. Use up to four times daily as directed. prediabetes 1 each 0  .  cetirizine (ZYRTEC) 10 MG tablet Take 10 mg by mouth daily.      . Diclofenac Sodium 2 % SOLN Place onto the skin as directed for pain as needed.    . hyoscyamine (LEVSIN SL) 0.125 MG SL tablet Place 1 tablet (0.125 mg total) under the tongue every 4 (four) hours as needed. 15 tablet 1  . MAGNESIUM PO Take 1 capsule by mouth daily. With vitamin B6    . meloxicam (MOBIC) 15 MG tablet Take 1 tablet (15 mg total) by mouth daily as needed for pain. With food 90 tablet 2  . metoprolol succinate (TOPROL-XL) 25 MG 24 hr tablet TAKE 1 TABLET (25 MG TOTAL) BY MOUTH DAILY. 90 tablet 2  . Misc Natural Products (COSAMIN ASU ADVANCED FORMULA PO) Take 2 each by mouth daily.    . Omega-3 Fatty Acids (FISH OIL BURP-LESS) 1000 MG CAPS Takes 1000 mg BID 60 capsule 0  . omeprazole (PRILOSEC) 40 MG capsule Take 1 capsule (40 mg total) by mouth daily. 30 capsule 5  . ondansetron (ZOFRAN) 4 MG tablet Take 1 tablet (4 mg total) by mouth every 6 (six) hours. 12 tablet 0  . Polyethyl Glycol-Propyl Glycol (SYSTANE OP) Apply 1 drop to eye as needed. Apply to both eyes as needed    . rifaximin (XIFAXAN) 550 MG TABS tablet Take 1 tablet (550 mg total) by mouth 3 (three) times daily. 42 tablet 0  . vitamin B-12 (CYANOCOBALAMIN) 1000 MCG tablet Take 1,000 mcg by mouth daily.     . vitamin E 400 UNIT capsule Take 400 Units by mouth daily.    . rosuvastatin (CRESTOR) 5 MG tablet Take 1 tablet (5 mg total) by mouth daily. 90 tablet 2   No current facility-administered medications on file prior to visit.     Past Medical History:  Diagnosis Date  . Allergy    seasonal  . Ankylosing spondylitis ([place])   . Anxiety   . Arthritis   . Bell palsy   . CTS (carpal tunnel syndrome)   . Cystitis   . Diverticulitis   . Diverticulosis of colon   . Dry eye syndrome   . Frozen shoulder   . GERD (gastroesophageal reflux disease)   . HLA B27 (HLA B27 positive)   . Hx of colonic polyps   . Hyperlipidemia    NMR 2005  .  Hypertension   . Hypothyroidism   . Incontinence   . Internal hemorrhoids   . Interstitial cystitis   . Kidney stone   . Menopause   . Osteoarthritis   . Osteopenia    [facility]  . S/P total hysterectomy and bilateral salpingo-oophorectomy 04/12/2014  . Sleep apnea   . Subjective visual disturbance of both eyes 12/30/2013  . Tubular adenoma of colon   . Vitamin D deficiency   . Xerostomia    and Xeroophthalmia    Past Surgical History:  Procedure Laterality Date  . ABDOMINAL HYSTERECTOMY    . BLADDER SURGERY     for incontinence  . CARPAL TUNNEL RELEASE Left 12/22/2014  Procedure: LEFT CARPAL TUNNEL RELEASE;  Surgeon: Daryll Brod, MD;  Location: [facility];  Service: Orthopedics;  Laterality: Left;  . COLONOSCOPY W/ POLYPECTOMY     adenomatous poyp 04-2008  . colonscopy     tics 10-2002  . CYSTOSCOPY WITH RETROGRADE PYELOGRAM, URETEROSCOPY AND STENT PLACEMENT Left 09/01/2013   Procedure: CYSTOSCOPY WITH RETROGRADE PYELOGRAM, AND LEFT STENT PLACEMENT;  Surgeon: Molli Hazard, MD;  Location[facility];  Service: Urology;  Laterality: Left;  . elevated LFTs     due to Elmiron  . FINGER SURGERY Left 2004   2nd finger  . G 1 P 1    . interstitial cystitis     with clot in catheter  . SHOULDER ARTHROSCOPY Left   . TOTAL ABDOMINAL HYSTERECTOMY W/ BILATERAL SALPINGOOPHORECTOMY     dysfunctional menses    Social History   Socioeconomic History  . Marital status: Married    Spouse name: Not on file  . Number of children: 1  . Years of education: BS  . Highest education level: Not on file  Occupational History  . Occupation: retired  . Occupation: Pharmacist, hospital    Comment: Kindergarten  Social Needs  . Financial resource strain: Not on file  . Food insecurity:    Worry: Not on file    Inability: Not on file  . Transportation needs:    Medical: Not on file    Non-medical: Not on file  Tobacco Use  . Smoking status: Never Smoker  .  Smokeless tobacco: Never Used  Substance and Sexual Activity  . Alcohol use: Yes    Comment: occ  . Drug use: No  . Sexual activity: Not Currently  Lifestyle  . Physical activity:    Days per week: Not on file    Minutes per session: Not on file  . Stress: Not on file  Relationships  . Social connections:    Talks on phone: Not on file    Gets together: Not on file    Attends religious service: Not on file    Active member of club or organization: Not on file    Attends meetings of clubs or organizations: Not on file    Relationship status: Not on file  Other Topics Concern  . Not on file  Social History Narrative   Lives   Caffeine use:    Married       Family History  Problem Relation Age of Onset  . Coronary artery disease Mother   . Hypertension Mother   . Asthma Mother   . Osteoporosis Mother   . Dementia Mother   . Stroke Mother        mini cva  . Diabetes Father   . Cirrhosis Father        non alcoholic  . Cancer Sister        colon  . Colon cancer Sister 61  . Cancer Paternal Aunt        2 aunts had renal cancer  . Coronary artery disease Maternal Grandfather   . Cancer Maternal Aunt        breast  . Diabetes Paternal Aunt   . Diabetes Paternal Grandmother     Review of Systems  Constitutional: Negative for chills and fever.  Respiratory: Positive for cough (sometimes with allergies). Negative for shortness of breath and wheezing.   Cardiovascular: Positive for leg swelling (sometimes). Negative for chest pain and palpitations.  Neurological: Positive for dizziness and light-headedness. Negative for headaches (  occ).       Objective:   Vitals:   11/01/17 0923  BP: 128/82  Pulse: 62  Resp: 16  Temp: 98 F (36.7 C)  SpO2: 96%   BP Readings from Last 3 Encounters:  11/01/17 128/82  10/15/17 124/82  10/09/17 120/72   Wt Readings from Last 3 Encounters:  11/01/17 159 lb (72.1 kg)  10/15/17 166 lb (75.3 kg)  10/09/17 163 lb 12.8 oz (74.3  kg)   Body mass index is 29.08 kg/m.   Physical Exam    Constitutional: Appears well-developed and well-nourished. No distress.  HENT:  Head: Normocephalic and atraumatic.  Neck: Neck supple. No tracheal deviation present. No thyromegaly present.  No cervical lymphadenopathy Cardiovascular: Normal rate, regular rhythm and normal heart sounds.   No murmur heard. No carotid bruit .  No edema Pulmonary/Chest: Effort normal and breath sounds normal. No respiratory distress. No has no wheezes. No rales.  Skin: Skin is warm and dry. Not diaphoretic.  Psychiatric: Normal mood and affect. Behavior is normal.   Diabetic Foot Exam - Simple   Simple Foot Form Diabetic Foot exam was performed with the following findings:  Yes 11/01/2017 10:12 AM  Visual Inspection No deformities, no ulcerations, no other skin breakdown bilaterally:  Yes Sensation Testing Intact to touch and monofilament testing bilaterally:  Yes Pulse Check Posterior Tibialis and Dorsalis pulse intact bilaterally:  Yes Comments       Assessment & Plan:    See Problem List for Assessment and Plan of chronic medical problems.

## 2017-10-31 NOTE — Patient Instructions (Addendum)

## 2017-11-01 ENCOUNTER — Encounter: Payer: Self-pay | Admitting: Internal Medicine

## 2017-11-01 ENCOUNTER — Other Ambulatory Visit (INDEPENDENT_AMBULATORY_CARE_PROVIDER_SITE_OTHER): Payer: Medicare Other

## 2017-11-01 ENCOUNTER — Ambulatory Visit (INDEPENDENT_AMBULATORY_CARE_PROVIDER_SITE_OTHER): Payer: Medicare Other | Admitting: Internal Medicine

## 2017-11-01 VITALS — BP 128/82 | HR 62 | Temp 98.0°F | Resp 16 | Ht 62.0 in | Wt 159.0 lb

## 2017-11-01 DIAGNOSIS — I2510 Atherosclerotic heart disease of native coronary artery without angina pectoris: Secondary | ICD-10-CM | POA: Diagnosis not present

## 2017-11-01 DIAGNOSIS — E782 Mixed hyperlipidemia: Secondary | ICD-10-CM

## 2017-11-01 DIAGNOSIS — E119 Type 2 diabetes mellitus without complications: Secondary | ICD-10-CM

## 2017-11-01 DIAGNOSIS — M810 Age-related osteoporosis without current pathological fracture: Secondary | ICD-10-CM | POA: Diagnosis not present

## 2017-11-01 DIAGNOSIS — I10 Essential (primary) hypertension: Secondary | ICD-10-CM

## 2017-11-01 LAB — CBC WITH DIFFERENTIAL/PLATELET
BASOS ABS: 0.1 10*3/uL (ref 0.0–0.1)
Basophils Relative: 1 % (ref 0.0–3.0)
EOS ABS: 0.2 10*3/uL (ref 0.0–0.7)
Eosinophils Relative: 3.4 % (ref 0.0–5.0)
HCT: 38.7 % (ref 36.0–46.0)
Hemoglobin: 12.9 g/dL (ref 12.0–15.0)
LYMPHS ABS: 1.7 10*3/uL (ref 0.7–4.0)
Lymphocytes Relative: 30.8 % (ref 12.0–46.0)
MCHC: 33.3 g/dL (ref 30.0–36.0)
MCV: 87.8 fl (ref 78.0–100.0)
MONO ABS: 0.4 10*3/uL (ref 0.1–1.0)
MONOS PCT: 8.2 % (ref 3.0–12.0)
NEUTROS ABS: 3.1 10*3/uL (ref 1.4–7.7)
NEUTROS PCT: 56.6 % (ref 43.0–77.0)
PLATELETS: 238 10*3/uL (ref 150.0–400.0)
RBC: 4.41 Mil/uL (ref 3.87–5.11)
RDW: 13.4 % (ref 11.5–15.5)
WBC: 5.4 10*3/uL (ref 4.0–10.5)

## 2017-11-01 LAB — COMPREHENSIVE METABOLIC PANEL
ALT: 23 U/L (ref 0–35)
AST: 23 U/L (ref 0–37)
Albumin: 4.2 g/dL (ref 3.5–5.2)
Alkaline Phosphatase: 84 U/L (ref 39–117)
BILIRUBIN TOTAL: 0.5 mg/dL (ref 0.2–1.2)
BUN: 18 mg/dL (ref 6–23)
CALCIUM: 9.7 mg/dL (ref 8.4–10.5)
CO2: 28 meq/L (ref 19–32)
CREATININE: 1.09 mg/dL (ref 0.40–1.20)
Chloride: 104 mEq/L (ref 96–112)
GFR: 52.19 mL/min — ABNORMAL LOW (ref >60.00)
Glucose, Bld: 101 mg/dL — ABNORMAL HIGH (ref 70–99)
Potassium: 4.3 mEq/L (ref 3.5–5.1)
Sodium: 139 mEq/L (ref 135–145)
TOTAL PROTEIN: 7.3 g/dL (ref 6.0–8.3)

## 2017-11-01 LAB — MICROALBUMIN / CREATININE URINE RATIO
Creatinine,U: 74.4 mg/dL
Microalb Creat Ratio: 0.9 mg/g (ref 0.0–30.0)
Microalb, Ur: <0.7 mg/dL (ref 0.0–1.9)

## 2017-11-01 LAB — LIPID PANEL
CHOLESTEROL: 138 mg/dL (ref 0–200)
HDL: 54.2 mg/dL (ref >39.00)
LDL Cholesterol: 55 mg/dL (ref 0–99)
NONHDL: 84.27
Total CHOL/HDL Ratio: 3
Triglycerides: 148 mg/dL (ref 0.0–149.0)
VLDL: 29.6 mg/dL (ref 0.0–40.0)

## 2017-11-01 LAB — HEMOGLOBIN A1C: Hgb A1c MFr Bld: 6.4 % (ref 4.6–6.5)

## 2017-11-01 NOTE — Assessment & Plan Note (Signed)
Did not tolerate Forteo Does not want to try an alternative Taking calcium and vitamin D not exercising regularly-encourage more regular exercise

## 2017-11-01 NOTE — Assessment & Plan Note (Signed)
Check lipid panel  Continue daily statin - trying to see what she can tolerate Regular exercise and healthy diet encouraged

## 2017-11-01 NOTE — Assessment & Plan Note (Signed)
BP well controlled Current regimen effective and well tolerated Continue current medications at current doses cmp  

## 2017-11-01 NOTE — Assessment & Plan Note (Signed)
Diet controlled Increase exercise Keep working on improving diet - low sugar/carb diet Check a1c, urine micro

## 2017-11-22 NOTE — Progress Notes (Addendum)
Subjective:   [name] is a 72 y.o. female who presents for Medicare Annual (Subsequent) preventive examination.  Review of Systems:  No ROS.  Medicare Wellness Visit. Additional risk factors are reflected in the social history.  Cardiac Risk Factors include: advanced age (>58mn, >>61women);diabetes mellitus;dyslipidemia;hypertension Sleep patterns: feels rested on waking, gets up 1-2 times nightly to void and sleeps 7 hours nightly.    Home Safety/Smoke Alarms: Feels safe in home. Smoke alarms in place.  Living environment; residence and Firearm Safety: 1-story house/ trailer, no firearms. Lives with husband, no needs for DME, good support system Seat Belt Safety/Bike Helmet: Wears seat belt.    Objective:     Vitals: BP (!) 154/92   Pulse (!) 54   Resp 18   Ht 5' 2"  (1.575 m)   Wt 164 lb (74.4 kg)   SpO2 98%   BMI 30.00 kg/m   Body mass index is 30 kg/m.  Advanced Directives 11/23/2017 05/20/2017 10/12/2016 10/09/2016 12/22/2014 12/17/2014 01/02/2014  Does Patient Have a Medical Advance Directive? No No No No No No Patient does not have advance directive;Patient would not like information  Type of Advance Directive - - - - - Living will -  Does patient want to make changes to medical advance directive? Yes (ED - Information included in AVS) - - - - - -  Would patient like information on creating a medical advance directive? - No - Patient declined - Yes (MAU/Ambulatory/Procedural Areas - Information given) No - patient declined information - -    Tobacco Social History   Tobacco Use  Smoking Status Never Smoker  Smokeless Tobacco Never Used     Counseling given: Not Answered  Past Medical History:  Diagnosis Date  . Allergy    seasonal  . [place]   . Anxiety   . Arthritis   . Bell palsy   . CTS (carpal tunnel syndrome)   . Cystitis   . Diverticulitis   . Diverticulosis of colon   . Dry eye syndrome   . Frozen shoulder   . GERD  (gastroesophageal reflux disease)   . HLA B27 (HLA B27 positive)   . Hx of colonic polyps   . Hyperlipidemia    NMR 2005  . Hypertension   . Hypothyroidism   . Incontinence   . Internal hemorrhoids   . Interstitial cystitis   . Kidney stone   . Menopause   . Osteoarthritis   . Osteopenia    [facility] . S/P total hysterectomy and bilateral salpingo-oophorectomy 04/12/2014  . Sleep apnea    c-pap  . Subjective visual disturbance of both eyes 12/30/2013  . Tubular adenoma of colon   . Vitamin D deficiency   . Xerostomia    and Xeroophthalmia   Past Surgical History:  Procedure Laterality Date  . ABDOMINAL HYSTERECTOMY    . BLADDER SURGERY     for incontinence  . CARPAL TUNNEL RELEASE Left 12/22/2014   Procedure: LEFT CARPAL TUNNEL RELEASE;  Surgeon: GDaryll Brod MD;  [place]  Service: Orthopedics;  Laterality: Left;  . COLONOSCOPY W/ POLYPECTOMY     adenomatous poyp 04-2008  . colonscopy     tics 10-2002  . CYSTOSCOPY WITH RETROGRADE PYELOGRAM, URETEROSCOPY AND STENT PLACEMENT Left 09/01/2013   Procedure: CYSTOSCOPY WITH RETROGRADE PYELOGRAM, AND LEFT STENT PLACEMENT;  Surgeon: DMolli Hazard MD;  [facility];  Service: Urology;  Laterality: Left;  .  elevated LFTs     due to Elmiron  . FINGER SURGERY Left 2004   2nd finger  . G 1 P 1    . interstitial cystitis     with clot in catheter  . SHOULDER ARTHROSCOPY Left   . TOTAL ABDOMINAL HYSTERECTOMY W/ BILATERAL SALPINGOOPHORECTOMY     dysfunctional menses   Family History  Problem Relation Age of Onset  . Coronary artery disease Mother   . Hypertension Mother   . Asthma Mother   . Osteoporosis Mother   . Dementia Mother   . Stroke Mother        mini cva  . Diabetes Father   . Cirrhosis Father        non alcoholic  . Cancer Sister        colon  . Colon cancer Sister 25  . Cancer Paternal Aunt        2 aunts had renal cancer  . Coronary artery  disease Maternal Grandfather   . Cancer Maternal Aunt        breast  . Diabetes Paternal Aunt   . Diabetes Paternal Grandmother    Social History   Socioeconomic History  . Marital status: Married    Spouse name: Not on file  . Number of children: 1  . Years of education: BS  . Highest education level: Not on file  Occupational History  . Occupation: retired  . Occupation: Pharmacist, hospital    Comment: Kindergarten  Social Needs  . Financial resource strain: Not hard at all  . Food insecurity:    Worry: Never true    Inability: Never true  . Transportation needs:    Medical: No    Non-medical: No  Tobacco Use  . Smoking status: Never Smoker  . Smokeless tobacco: Never Used  Substance and Sexual Activity  . Alcohol use: Not Currently    Comment:      . Drug use: No  . Sexual activity: Not Currently  Lifestyle  . Physical activity:    Days per week: 0 days    Minutes per session: 0 min  . Stress: Rather much  Relationships  . Social connections:    Talks on phone: More than three times a week    Gets together: More than three times a week    Attends religious service: Not on file    Active member of club or organization: Not on file    Attends meetings of clubs or organizations: Not on file    Relationship status: Married  Other Topics Concern  . Not on file  Social History Narrative   Lives   Caffeine use:    Married       Outpatient Encounter Medications as of 11/23/2017  Medication Sig  . acetaminophen (TYLENOL) 500 MG tablet Take 500 mg by mouth every 4 (four) hours as needed for mild pain.   . Ascorbic Acid (VITAMIN C) 1000 MG tablet Take 1,000 mg by mouth daily.   Marland Kitchen aspirin 81 MG tablet Take 81 mg by mouth daily.    Marland Kitchen BIOTIN PO Take 1 tablet by mouth daily.  . blood glucose meter kit and supplies KIT Dispense based on patient and insurance preference. Use up to four times daily as directed. prediabetes  . cetirizine (ZYRTEC) 10 MG tablet Take 10 mg by mouth  daily.    . Diclofenac Sodium 2 % SOLN Place onto the skin as directed for pain as needed.  . hyoscyamine (LEVSIN SL) 0.125  MG SL tablet Place 1 tablet (0.125 mg total) under the tongue every 4 (four) hours as needed.  Marland Kitchen MAGNESIUM PO Take 1 capsule by mouth daily. With vitamin B6  . meloxicam (MOBIC) 15 MG tablet Take 1 tablet (15 mg total) by mouth daily as needed for pain. With food  . metoprolol succinate (TOPROL-XL) 25 MG 24 hr tablet TAKE 1 TABLET (25 MG TOTAL) BY MOUTH DAILY.  Marland Kitchen Omega-3 Fatty Acids (FISH OIL BURP-LESS) 1000 MG CAPS Takes 1000 mg BID  . omeprazole (PRILOSEC) 40 MG capsule Take 1 capsule (40 mg total) by mouth daily.  Vladimir Faster Glycol-Propyl Glycol (SYSTANE OP) Apply 1 drop to eye as needed. Apply to both eyes as needed  . vitamin B-12 (CYANOCOBALAMIN) 1000 MCG tablet Take 1,000 mcg by mouth daily.   . vitamin E 400 UNIT capsule Take 400 Units by mouth daily.  . rosuvastatin (CRESTOR) 5 MG tablet Take 1 tablet (5 mg total) by mouth daily.  . [DISCONTINUED] Misc Natural Products (COSAMIN ASU ADVANCED FORMULA PO) Take 2 each by mouth daily.  . [DISCONTINUED] rifaximin (XIFAXAN) 550 MG TABS tablet Take 1 tablet (550 mg total) by mouth 3 (three) times daily. (Patient not taking: Reported on 11/23/2017)   No facility-administered encounter medications on file as of 11/23/2017.     Activities of Daily Living In your present state of health, do you have any difficulty performing the following activities: 11/23/2017  Hearing? N  Vision? N  Difficulty concentrating or making decisions? N  Walking or climbing stairs? N  Dressing or bathing? N  Doing errands, shopping? N  Preparing Food and eating ? N  Using the Toilet? N  In the past six months, have you accidently leaked urine? N  Do you have problems with loss of bowel control? N  Managing your Medications? N  Managing your Finances? N  Housekeeping or managing your Housekeeping? N  Some recent data might be hidden     Patient Care Team: Binnie Rail, MD as PCP - General (Internal Medicine) Dorothy Spark, MD as PCP - Cardiology (Cardiology) Pyrtle, Lajuan Lines, MD as Consulting Physician (Gastroenterology) Lyndal Pulley, DO as Consulting Physician (Family Medicine) Rutherford Guys, MD as Consulting Physician (Ophthalmology)    Assessment:   This is a routine wellness examination for [name]. Physical assessment deferred to PCP.  Exercise Activities and Dietary recommendations Current Exercise Habits: The patient does not participate in regular exercise at present, Exercise limited by: orthopedic condition(s)  Diet (meal preparation, eat out, water intake, caffeinated beverages, dairy products, fruits and vegetables): in general, a "healthy" diet  , well balanced   Reviewed heart healthy and diabetic diet, encouraged patient to increase daily water intake. Relevant patient education assigned to patient using Emmi. Diet education was attached to patient's AVS.  Goals    . Patient Stated     Increase my physical activity by walking more and exercise at home. Eat healthier by monitoring cholesterol, sugar and carbohydrates. Take a few minutes daily to do something just for me.       Fall Risk Fall Risk  11/23/2017 05/02/2017 06/02/2015 04/08/2014  Falls in the past year? No No No No    Depression Screen PHQ 2/9 Scores 11/23/2017 05/02/2017 06/02/2015 04/08/2014  PHQ - 2 Score 2 0 1 0  PHQ- 9 Score 3 - - -     Cognitive Function       Ad8 score reviewed for issues:  Issues making decisions: no  Less interest in hobbies / activities: no  Repeats questions, stories (family complaining): no  Trouble using ordinary gadgets (microwave, computer, phone):no  Forgets the month or year: no  Mismanaging finances: no  Remembering appts: no  Daily problems with thinking and/or memory: no Ad8 score is= 0  Immunization History  Administered Date(s) Administered  . Influenza Split 05/09/2011,  05/07/2012  . Influenza Whole 08/01/2003, 05/29/2007, 04/15/2008, 04/30/2009, 05/03/2010  . Influenza, High Dose Seasonal PF 05/01/2013, 04/18/2014, 04/13/2016, 05/02/2017  . Influenza,inj,Quad PF,6+ Mos 05/21/2015  . Pneumococcal Conjugate-13 02/25/2015  . Pneumococcal Polysaccharide-23 07/31/2005, 05/27/2012  . Td 06/27/2010  . Zoster 07/19/2012  . Zoster Recombinat (Shingrix) 06/15/2017, 08/20/2017   Screening Tests Health Maintenance  Topic Date Due  . INFLUENZA VACCINE  02/28/2018  . OPHTHALMOLOGY EXAM  04/19/2018  . HEMOGLOBIN A1C  05/03/2018  . DEXA SCAN  10/17/2018  . FOOT EXAM  11/02/2018  . URINE MICROALBUMIN  11/02/2018  . COLONOSCOPY  01/27/2019  . MAMMOGRAM  10/19/2019  . TETANUS/TDAP  06/27/2020  . Hepatitis C Screening  Completed  . PNA vac Low Risk Adult  Completed      Plan:     I have personally reviewed and noted the following in the patient's chart:   . Medical and social history . Use of alcohol, tobacco or illicit drugs  . Current medications and supplements . Functional ability and status . Nutritional status . Physical activity . Advanced directives . List of other physicians . Vitals . Screenings to include cognitive, depression, and falls . Referrals and appointments  In addition, I have reviewed and discussed with patient certain preventive protocols, quality metrics, and best practice recommendations. A written personalized care plan for preventive services as well as general preventive health recommendations were provided to patient.     Michiel Cowboy, RN  11/23/2017   Medical screening examination/treatment/procedure(s) were performed by the Wellness Coach, RN. As primary care provider I was immediately available for consulation/collaboration. I agree with above documentation. Caesar Chestnut, NP

## 2017-11-23 ENCOUNTER — Ambulatory Visit (INDEPENDENT_AMBULATORY_CARE_PROVIDER_SITE_OTHER): Payer: Medicare Other | Admitting: *Deleted

## 2017-11-23 VITALS — BP 154/92 | HR 54 | Resp 18 | Ht 62.0 in | Wt 164.0 lb

## 2017-11-23 DIAGNOSIS — Z0000 Encounter for general adult medical examination without abnormal findings: Secondary | ICD-10-CM | POA: Diagnosis not present

## 2017-11-23 NOTE — Patient Instructions (Addendum)
Continue doing brain stimulating activities (puzzles, reading, adult coloring books, staying active) to keep memory sharp.   Continue to eat heart healthy diet (full of fruits, vegetables, whole grains, lean protein, water--limit salt, fat, and sugar intake) and increase physical activity as tolerated.   [name] , Thank you for taking time to come for your Medicare Wellness Visit. I appreciate your ongoing commitment to your health goals. Please review the following plan we discussed and let me know if I can assist you in the future.   These are the goals we discussed: Goals    . Patient Stated     Increase my physical activity by walking more and exercise at home. Eat healthier by monitoring cholesterol, sugar and carbohydrates. Take a few minutes daily to do something just for me.       This is a list of the screening recommended for you and due dates:  Health Maintenance  Topic Date Due  . Flu Shot  02/28/2018  . Eye exam for diabetics  04/19/2018  . Hemoglobin A1C  05/03/2018  . DEXA scan (bone density measurement)  10/17/2018  . Complete foot exam   11/02/2018  . Urine Protein Check  11/02/2018  . Colon Cancer Screening  01/27/2019  . Mammogram  10/19/2019  . Tetanus Vaccine  06/27/2020  .  Hepatitis C: One time screening is recommended by Center for Disease Control  (CDC) for  adults born from [birth year] through 1967.   Completed  . Pneumonia vaccines  Completed

## 2017-11-24 ENCOUNTER — Encounter: Payer: Self-pay | Admitting: *Deleted

## 2017-12-17 MED FILL — ROSUVASTATIN CALCIUM 5 MG T: 5 | 30 days supply | Qty: 30 | Fill #8

## 2017-12-18 ENCOUNTER — Ambulatory Visit: Payer: Self-pay | Admitting: Internal Medicine

## 2017-12-21 ENCOUNTER — Encounter: Payer: Self-pay | Admitting: Internal Medicine

## 2017-12-21 ENCOUNTER — Other Ambulatory Visit: Payer: Self-pay

## 2017-12-21 DIAGNOSIS — R197 Diarrhea, unspecified: Secondary | ICD-10-CM

## 2017-12-21 MED ORDER — DIPHENOXYLATE-ATROPINE 2.5-0.025 MG PO TABS: 1.0000 | ORAL_TABLET | Freq: Four times a day (QID) | ORAL | 0 refills | Status: DC | PRN

## 2017-12-21 NOTE — Telephone Encounter (Signed)
Would recommend that she come for stool studies (GI path panel, fecal leukocytes, ova and parasite) Can offer lomotil 1 QIDPRN to help control diarrhea while we are waiting on stool testing

## 2017-12-23 NOTE — Progress Notes (Signed)
[name] [address], [place] [address] Phone: [phone] Subjective:     CC: Shoulder pain  ERX:[id number]  [name] is a 72 y.o. female coming in with complaint of left shoulder pain. She had an injection last visit which helped but the pain is increasing again. She notes that she had an improvment in her range of motion following the last injection.  Patient's last injection was 10 weeks ago.  Feels like she has near pain resolution for approximately 6 weeks at this point is started to return again.  Now waking her up at night and affecting daily activities such as even dressing.  Known arthritic changes.  She also continues to have "twinges" of pain in the left knee where she had a baker's cyst previously.  Nothing severe just wants to be aware.  Has had a ruptured Baker's cyst previously.     Past Medical History:  Diagnosis Date  . Allergy    seasonal  . Ankylosing spondylitis ([place])   . Anxiety   . Arthritis   . Bell palsy   . CTS (carpal tunnel syndrome)   . Cystitis   . Diverticulitis   . Diverticulosis of colon   . Dry eye syndrome   . Frozen shoulder   . GERD (gastroesophageal reflux disease)   . HLA B27 (HLA B27 positive)   . Hx of colonic polyps   . Hyperlipidemia    NMR 2005  . Hypertension   . Hypothyroidism   . Incontinence   . Internal hemorrhoids   . Interstitial cystitis   . Kidney stone   . Menopause   . Osteoarthritis   . Osteopenia    [facility] done [facility]  . S/P total hysterectomy and bilateral salpingo-oophorectomy 04/12/2014  . Sleep apnea    c-pap  . Subjective visual disturbance of both eyes 12/30/2013  . Tubular adenoma of colon   . Vitamin D deficiency   . Xerostomia    and Xeroophthalmia   Past Surgical History:  Procedure Laterality Date  . ABDOMINAL HYSTERECTOMY    . BLADDER SURGERY     for incontinence  . CARPAL TUNNEL RELEASE Left 12/22/2014   Procedure: LEFT CARPAL  TUNNEL RELEASE;  Surgeon: Daryll Brod, MD;  Location: [facility];  Service: Orthopedics;  Laterality: Left;  . COLONOSCOPY W/ POLYPECTOMY     adenomatous poyp 04-2008  . colonscopy     tics 10-2002  . CYSTOSCOPY WITH RETROGRADE PYELOGRAM, URETEROSCOPY AND STENT PLACEMENT Left 09/01/2013   Procedure: CYSTOSCOPY WITH RETROGRADE PYELOGRAM, AND LEFT STENT PLACEMENT;  Surgeon: Molli Hazard, MD;  [facility];  Service: Urology;  Laterality: Left;  . elevated LFTs     due to Elmiron  . FINGER SURGERY Left 2004   2nd finger  . G 1 P 1    . interstitial cystitis     with clot in catheter  . SHOULDER ARTHROSCOPY Left   . TOTAL ABDOMINAL HYSTERECTOMY W/ BILATERAL SALPINGOOPHORECTOMY     dysfunctional menses   Social History   Socioeconomic History  . Marital status: Married    Spouse name: Not on file  . Number of children: 1  . Years of education: BS  . Highest education level: Not on file  Occupational History  . Occupation: retired  . Occupation: Pharmacist, hospital    Comment: Kindergarten  Social Needs  . Financial resource strain: Not hard at all  . Food insecurity:  Worry: Never true    Inability: Never true  . Transportation needs:    Medical: No    Non-medical: No  Tobacco Use  . Smoking status: Never Smoker  . Smokeless tobacco: Never Used  Substance and Sexual Activity  . Alcohol use: Not Currently    Comment:      . Drug use: No  . Sexual activity: Not Currently  Lifestyle  . Physical activity:    Days per week: 0 days    Minutes per session: 0 min  . Stress: Rather much  Relationships  . Social connections:    Talks on phone: More than three times a week    Gets together: More than three times a week    Attends religious service: Not on file    Active member of club or organization: Not on file    Attends meetings of clubs or organizations: Not on file    Relationship status: Married  Other Topics Concern  . Not on file  Social History  Narrative   Lives   Caffeine use:    Married      Allergies  Allergen Reactions  . Nitroglycerin Anaphylaxis    Hypotensive after NTG administration in ER during chest pain evaluation Pt states she almost died,  Pt states in a procedure done in 10/2016 she had no reaction to this medication, so not sure if it is an actual allergy.   . Pentosan Polysulfate Sodium     Elevated LFTs.......Marland Kitchen elmiron   . Sulfamethoxazole-Trimethoprim Nausea Only    Unknown reaction per pt, Pt thinks she may have felt sick to her stomach.   . Bactrim [Sulfamethoxazole-Trimethoprim]   . Promethazine Hcl     "jittery on the inside"  . Lisinopril Cough   Family History  Problem Relation Age of Onset  . Coronary artery disease Mother   . Hypertension Mother   . Asthma Mother   . Osteoporosis Mother   . Dementia Mother   . Stroke Mother        mini cva  . Diabetes Father   . Cirrhosis Father        non alcoholic  . Cancer Sister        colon  . Colon cancer Sister 42  . Cancer Paternal Aunt        2 aunts had renal cancer  . Coronary artery disease Maternal Grandfather   . Cancer Maternal Aunt        breast  . Diabetes Paternal Aunt   . Diabetes Paternal Grandmother      Past medical history, social, surgical and family history all reviewed in electronic medical record.  No pertanent information unless stated regarding to the chief complaint.   Review of Systems:Review of systems updated and as accurate as of 12/25/17  No headache, visual changes, nausea, vomiting, diarrhea, constipation, dizziness, abdominal pain, skin rash, fevers, chills, night sweats, weight loss, swollen lymph nodes, body aches, joint swelling, chest pain, shortness of breath, mood changes.  Positive muscle aches  Objective  Blood pressure 122/86, pulse 60, height 5' 2"  (1.575 m), weight 166 lb (75.3 kg), SpO2 98 %. Systems examined below as of 12/25/17   General: No apparent distress alert and oriented x3 mood and  affect normal, dressed appropriately.  HEENT: Pupils equal, extraocular movements intact  Respiratory: Patient's speak in full sentences and does not appear short of breath  Cardiovascular: No lower extremity edema, non tender, no erythema  Skin: Warm dry intact with  no signs of infection or rash on extremities or on axial skeleton.  Abdomen: Soft nontender  Neuro: Cranial nerves II through XII are intact, neurovascularly intact in all extremities with 2+ DTRs and 2+ pulses.  Lymph: No lymphadenopathy of posterior or anterior cervical chain or axillae bilaterally.  Gait mild antalgic MSK:  tender with full range of motion and good stability and symmetric strength and tone of  elbows, wrist, hip, knee and ankles bilaterally.  Left shoulder examination the patient does have limited range of motion in all planes by 5 to 10 degrees.  Crepitus noted.  Strength 4 out of 5 compared to contralateral side.  Positive impingement signs.  After informed written and verbal consent, patient was seated on exam table. Left shoulder was prepped with alcohol swab and utilizing posterior approach, patient's right glenohumeral space was injected with 4:1  marcaine 0.5%: Kenalog 25m/dL. Patient tolerated the procedure well without immediate complications.     Impression and Recommendations:     This case required medical decision making of moderate complexity.      Note: This dictation was prepared with Dragon dictation along with smaller phrase technology. Any transcriptional errors that result from this process are unintentional.

## 2017-12-25 ENCOUNTER — Other Ambulatory Visit: Payer: Medicare Other

## 2017-12-25 ENCOUNTER — Ambulatory Visit (INDEPENDENT_AMBULATORY_CARE_PROVIDER_SITE_OTHER): Payer: Medicare Other | Admitting: Family Medicine

## 2017-12-25 ENCOUNTER — Encounter: Payer: Self-pay | Admitting: Family Medicine

## 2017-12-25 DIAGNOSIS — I2510 Atherosclerotic heart disease of native coronary artery without angina pectoris: Secondary | ICD-10-CM | POA: Diagnosis not present

## 2017-12-25 DIAGNOSIS — R197 Diarrhea, unspecified: Secondary | ICD-10-CM | POA: Diagnosis not present

## 2017-12-25 DIAGNOSIS — M19012 Primary osteoarthritis, left shoulder: Secondary | ICD-10-CM | POA: Diagnosis not present

## 2017-12-25 NOTE — Assessment & Plan Note (Signed)
Injected left shoulder again today.  Worsening symptoms.  Patient failed all other conservative therapy.  Does not want any surgical intervention.  Continues to try to stay active.  Has responded well to injections previously.  Continue same medications.  Given trial of topical anti-inflammatories.  Follow-up again in 10 weeks

## 2017-12-25 NOTE — Patient Instructions (Signed)
Good to see you  [name] is your friend.  Stay hydrated  Keep hands within peripheral vision  Stay active though  Continue the vitamins if they are helping  See me again in 10 weeks

## 2017-12-31 ENCOUNTER — Encounter: Payer: Self-pay | Admitting: Cardiology

## 2017-12-31 ENCOUNTER — Ambulatory Visit (INDEPENDENT_AMBULATORY_CARE_PROVIDER_SITE_OTHER): Payer: Medicare Other | Admitting: Cardiology

## 2017-12-31 VITALS — BP 140/80 | HR 46 | Ht 62.0 in | Wt 164.4 lb

## 2017-12-31 DIAGNOSIS — I10 Essential (primary) hypertension: Secondary | ICD-10-CM

## 2017-12-31 DIAGNOSIS — E785 Hyperlipidemia, unspecified: Secondary | ICD-10-CM | POA: Diagnosis not present

## 2017-12-31 DIAGNOSIS — I2510 Atherosclerotic heart disease of native coronary artery without angina pectoris: Secondary | ICD-10-CM

## 2017-12-31 DIAGNOSIS — R7989 Other specified abnormal findings of blood chemistry: Secondary | ICD-10-CM | POA: Diagnosis not present

## 2017-12-31 DIAGNOSIS — Z1589 Genetic susceptibility to other disease: Secondary | ICD-10-CM

## 2017-12-31 DIAGNOSIS — H04123 Dry eye syndrome of bilateral lacrimal glands: Secondary | ICD-10-CM | POA: Diagnosis not present

## 2017-12-31 LAB — TSH: TSH: 7.12 u[IU]/mL — ABNORMAL HIGH (ref 0.450–4.500)

## 2017-12-31 MED ORDER — HYDROCHLOROTHIAZIDE 25 MG PO TABS: 25.0000 mg | ORAL_TABLET | Freq: Every day | ORAL | 2 refills | Status: DC

## 2017-12-31 MED FILL — HYDROCHLOROTHIAZIDE 25 MG T: 25 | 90 days supply | Qty: 90 | Fill #0

## 2017-12-31 NOTE — Progress Notes (Signed)
Cardiology Office Note    Date:  12/31/2017   ID:  [name], DOB [birth year], MRN [id number]  PCP:  Binnie Rail, MD  Cardiologist:  Ena Dawley, MD   Chief complain: 6 months follow up  History of Present Illness:  [name] is a 72 y.o. female with prior history of hypertension, hyperlipidemia who was previously seen in our office in 2014 for concerns of chest pain and underwent an exercise nuclear stress test that showed LVEF of 70% and no prior infarct or ischemia. She is coming 3 years later for occasional left-sided chest pain not related to exertion. She has been treated with Toprol for high blood pressure. She complains of fatigue and occasional dizziness. She also has occasional lower extremity edema. Denies any orthopnea or proximal nocturnal dyspnea. 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 She has never had an echocardiogram to evaluate for aortic regurgitation or ascending aortic aneurysm. She denies any palpitations or syncope.  10/17/2016 - the patient is coming after 4 weeks, she has visited ER on 10/12/16 with dizziness and chest pain and was discharged home after ACS was ruled out. She states that she feels dizzy ever since she started Benicar. She brings her BP diary and BP in the range 96-150, but most in 120-130.  She has also been experiencing DOE after walking 1 flight of stair associated with left sided chest pain that is pressure like and has no radiation. She has occassional LE edema.   07/30/2017 - 6 months follow up, the patient feels well, denies chest pain or SOB, also denies palpitations, or syncope. No claudications She has been experiencing dizziness, not just orthostatic, but also when is standing for longer time, no falls. She is tolerating medications well including statin.  December 31, 2017 -this is 6 months follow-up, the patient is doing well, she denies any chest pain shortness of breath, no  palpitations, occasional dizziness.  No presyncope or syncope.  She has been tolerating Crestor.  She gets lower extremity edema on her days.  Past Medical History:  Diagnosis Date  . Allergy    seasonal  . Ankylosing spondylitis ([place])   . Anxiety   . Arthritis   . Bell palsy   . CTS (carpal tunnel syndrome)   . Cystitis   . Diverticulitis   . Diverticulosis of colon   . Dry eye syndrome   . Frozen shoulder   . GERD (gastroesophageal reflux disease)   . HLA B27 (HLA B27 positive)   . Hx of colonic polyps   . Hyperlipidemia    NMR 2005  . Hypertension   . Hypothyroidism   . Incontinence   . Internal hemorrhoids   . Interstitial cystitis   . Kidney stone   . Menopause   . Osteoarthritis   . Osteopenia    [facility]  . S/P total hysterectomy and bilateral salpingo-oophorectomy 04/12/2014  . Sleep apnea    c-pap  . Subjective visual disturbance of both eyes 12/30/2013  . Tubular adenoma of colon   . Vitamin D deficiency   . Xerostomia    and Xeroophthalmia    Past Surgical History:  Procedure Laterality Date  . ABDOMINAL HYSTERECTOMY    . BLADDER SURGERY     for incontinence  . CARPAL TUNNEL RELEASE Left 12/22/2014   Procedure: LEFT CARPAL TUNNEL RELEASE;  Surgeon: Daryll Brod, MD;  Location: [facility];  Service: Orthopedics;  Laterality: Left;  . COLONOSCOPY W/ POLYPECTOMY     adenomatous poyp 04-2008  . colonscopy     tics 10-2002  . CYSTOSCOPY WITH RETROGRADE PYELOGRAM, URETEROSCOPY AND STENT PLACEMENT Left 09/01/2013   Procedure: CYSTOSCOPY WITH RETROGRADE PYELOGRAM, AND LEFT STENT PLACEMENT;  Surgeon: Molli Hazard, MD;  Location: [facility];  Service: Urology;  Laterality: Left;  . elevated LFTs     due to [place]  . FINGER SURGERY Left 2004   2nd finger  . G 1 P 1    . interstitial cystitis     with clot in catheter  . SHOULDER ARTHROSCOPY Left   . TOTAL ABDOMINAL HYSTERECTOMY W/ BILATERAL SALPINGOOPHORECTOMY      dysfunctional menses    Current Medications: Outpatient Medications Prior to Visit  Medication Sig Dispense Refill  . acetaminophen (TYLENOL) 500 MG tablet Take 500 mg by mouth every 4 (four) hours as needed for mild pain.     . Ascorbic Acid (VITAMIN C) 1000 MG tablet Take 1,000 mg by mouth daily.     Marland Kitchen aspirin 81 MG tablet Take 81 mg by mouth daily.      Marland Kitchen BIOTIN PO Take 1 tablet by mouth daily.    . blood glucose meter kit and supplies KIT Dispense based on patient and insurance preference. Use up to four times daily as directed. prediabetes 1 each 0  . cetirizine (ZYRTEC) 10 MG tablet Take 10 mg by mouth daily.      . Diclofenac Sodium 2 % SOLN Place onto the skin as directed for pain as needed.    . diphenoxylate-atropine (LOMOTIL) 2.5-0.025 MG tablet Take 1 tablet by mouth 4 (four) times daily as needed for diarrhea or loose stools. 30 tablet 0  . hyoscyamine (LEVSIN SL) 0.125 MG SL tablet Place 1 tablet (0.125 mg total) under the tongue every 4 (four) hours as needed. 15 tablet 1  . MAGNESIUM PO Take 1 capsule by mouth daily. With vitamin B6    . meloxicam (MOBIC) 15 MG tablet Take 1 tablet (15 mg total) by mouth daily as needed for pain. With food 90 tablet 2  . Omega-3 Fatty Acids (FISH OIL BURP-LESS) 1000 MG CAPS Takes 1000 mg BID 60 capsule 0  . omeprazole (PRILOSEC) 40 MG capsule Take 1 capsule (40 mg total) by mouth daily. 30 capsule 5  . Polyethyl Glycol-Propyl Glycol (SYSTANE OP) Apply 1 drop to eye as needed. Apply to both eyes as needed    . vitamin B-12 (CYANOCOBALAMIN) 1000 MCG tablet Take 1,000 mcg by mouth daily.     . vitamin E 400 UNIT capsule Take 400 Units by mouth daily.    . metoprolol succinate (TOPROL-XL) 25 MG 24 hr tablet TAKE 1 TABLET (25 MG TOTAL) BY MOUTH DAILY. 90 tablet 2  . rosuvastatin (CRESTOR) 5 MG tablet Take 1 tablet (5 mg total) by mouth daily. 90 tablet 2   No facility-administered medications prior to visit.      Allergies:   Nitroglycerin;  Pentosan polysulfate sodium; Sulfamethoxazole-trimethoprim; Bactrim [sulfamethoxazole-trimethoprim]; Promethazine hcl; and Lisinopril   Social History   Socioeconomic History  . Marital status: Married    Spouse name: Not on file  . Number of children: 1  . Years of education: BS  . Highest education level: Not on file  Occupational History  . Occupation: retired  . Occupation: Pharmacist, hospital    Comment: Kindergarten  Social Needs  . Financial resource strain: Not hard at all  .  Food insecurity:    Worry: Never true    Inability: Never true  . Transportation needs:    Medical: No    Non-medical: No  Tobacco Use  . Smoking status: Never Smoker  . Smokeless tobacco: Never Used  Substance and Sexual Activity  . Alcohol use: Not Currently    Comment:      . Drug use: No  . Sexual activity: Not Currently  Lifestyle  . Physical activity:    Days per week: 0 days    Minutes per session: 0 min  . Stress: Rather much  Relationships  . Social connections:    Talks on phone: More than three times a week    Gets together: More than three times a week    Attends religious service: Not on file    Active member of club or organization: Not on file    Attends meetings of clubs or organizations: Not on file    Relationship status: Married  Other Topics Concern  . Not on file  Social History Narrative   Lives   Caffeine use:    Married        Family History:  The patient's family history includes Asthma in her mother; Cancer in her maternal aunt, paternal aunt, and sister; Cirrhosis in her father; Colon cancer (age of onset: 5) in her sister; Coronary artery disease in her maternal grandfather and mother; Dementia in her mother; Diabetes in her father, paternal aunt, and paternal grandmother; Hypertension in her mother; Osteoporosis in her mother; Stroke in her mother.   ROS:   Please see the history of present illness.    ROS All other systems reviewed and are negative.   PHYSICAL  EXAM:   VS:  BP 140/80   Pulse (!) 46   Ht 5' 2"  (1.575 m)   Wt 164 lb 6.4 oz (74.6 kg)   SpO2 98%   BMI 30.07 kg/m    GEN: Well nourished, well developed, in no acute distress  HEENT: normal  Neck: no JVD, carotid bruits, or masses Cardiac: RRR; no murmurs, rubs, or gallops,no edema  Respiratory:  clear to auscultation bilaterally, normal work of breathing GI: soft, nontender, nondistended, + BS MS: no deformity or atrophy  Skin: warm and dry, no rash Neuro:  Alert and Oriented x 3, Strength and sensation are intact Psych: euthymic mood, full affect  Wt Readings from Last 3 Encounters:  12/31/17 164 lb 6.4 oz (74.6 kg)  12/25/17 166 lb (75.3 kg)  11/23/17 164 lb (74.4 kg)    Studies/Labs Reviewed:   EKG:  EKG is ordered today.  The ekg ordered today demonstrates sinus bradycardia, 56 bpm, left axis deviation, nonspecific T wave abnormalities.  Recent Labs: 01/15/2017: TSH 3.48 09/24/2017: Magnesium 2.0 11/01/2017: ALT 23; BUN 18; Creatinine, Ser 1.09; Hemoglobin 12.9; Platelets 238.0; Potassium 4.3; Sodium 139   Lipid Panel    Component Value Date/Time   CHOL 138 11/01/2017 1034   CHOL 125 08/06/2017 0924   TRIG 148.0 11/01/2017 1034   TRIG 181 (H) 07/23/2006 1101   HDL 54.20 11/01/2017 1034   HDL 56 08/06/2017 0924   CHOLHDL 3 11/01/2017 1034   VLDL 29.6 11/01/2017 1034   LDLCALC 55 11/01/2017 1034   LDLCALC 43 08/06/2017 0924   LDLDIRECT 114.0 05/02/2016 0919   Additional studies/ records that were reviewed today include:   ECG performed today 12/31/2017 shows sinus bradycardia 46 bpm, left anterior fascicular block, heart rate slower than previously otherwise unchanged, this  was personally reviewed.   ASSESSMENT:    1. CAD in native artery   2. Hyperlipidemia, unspecified hyperlipidemia type   3. HLA B27 (HLA B27 positive)   4. Essential (primary) hypertension     PLAN:  In order of problems listed above:  1. Dizziness - normal carotis arteries on carotid  US, I will discontinue her metoprolol as her heart rate continues to decrease, I will check her TSH. 2. Hypertension - controlled, there is mild to moderate concentric LVH, LVEF 60-65%, I will discontinue Toprol-XL as she is bradycardic, start hydrochlorothiazide 25 mg daily as she has intermittent lower extremity edema.  We will bring her in 2 weeks to the blood pressure clinic and if her blood pressure remains elevated we will add losartan 25 mg daily. 3. Hyperlipidemia - patient's LDL and triglycerides are elevated, together with moderate CAD on coronary CTA she was started on rosuvastatin 5 mg po daily, she is tolerating it well, we will repeat lipids and LFTs now.  We will continue. 4. Chest pain - negative stress test in the past, calcium score and coronary CTA showed coronary calcium score of 56. This was 49 percentile for age and sex matched control. Moderate CAD in RCA/PDA, CT FFR was normal. 5. HLA B gene positivity - associated ankylosing spondylitis, echo in 09/2016 showed no ascending aortic aneurysm but mild aortic regurgitation. We will follow every other year.  Follow-up with blood pressure clinic in 2 weeks, follow-up with [place] in 6 months.  Medication Adjustments/Labs and Tests Ordered: Current medicines are reviewed at length with the patient today.  Concerns regarding medicines are outlined above.  Medication changes, Labs and Tests ordered today are listed in the Patient Instructions below. Patient Instructions  Medication Instructions:   STOP TAKING TOPROL XL NOW  START TAKING HYDROCHLOROTHIAZIDE 25 MG ONCE DAILY    Labwork:  TODAY--TSH     Follow-Up:  Your physician wants you to follow-up in: [place] will receive a reminder letter in the mail two months in advance. If you don't receive a letter, please call our office to schedule the follow-up appointment.        If you need a refill on your cardiac medications before your next appointment,  please call your pharmacy.      Signed, Ena Dawley, MD  12/31/2017 9:30 AM    [facility] [address], [place], [place]  [address] Phone: [phone]; Fax: [phone]

## 2017-12-31 NOTE — Patient Instructions (Addendum)
Medication Instructions:   STOP TAKING TOPROL XL NOW  START TAKING HYDROCHLOROTHIAZIDE 25 MG ONCE DAILY    Labwork:  TODAY--TSH     Follow-Up:  BP CLINIC IN 2 WEEKS TO SEE OUR PHARMACIST    Your physician wants you to follow-up in: [place] will receive a reminder letter in the mail two months in advance. If you don't receive a letter, please call our office to schedule the follow-up appointment.        If you need a refill on your cardiac medications before your next appointment, please call your pharmacy.

## 2018-01-01 MED FILL — RESTASIS 0.05% EYE EMULSION: 0.05 | 30 days supply | Qty: 60 | Fill #0

## 2018-01-03 LAB — GASTROINTESTINAL PATHOGEN PANEL PCR
C. DIFFICILE TOX A/B, PCR: UNDETERMINED — AB
CRYPTOSPORIDIUM, PCR: UNDETERMINED — AB
Campylobacter, PCR: UNDETERMINED — AB
E COLI (STEC) STX1/STX2, PCR: UNDETERMINED — AB
E coli (ETEC) LT/ST PCR: UNDETERMINED — AB
E coli 0157, PCR: UNDETERMINED — AB
Giardia lamblia, PCR: UNDETERMINED — AB
Norovirus, PCR: UNDETERMINED — AB
Rotavirus A, PCR: UNDETERMINED — AB
Salmonella, PCR: UNDETERMINED — AB
Shigella, PCR: UNDETERMINED — AB

## 2018-01-03 LAB — OVA AND PARASITE EXAMINATION
CONCENTRATE RESULT: NONE SEEN
MICRO NUMBER:: 90640367
SPECIMEN QUALITY:: ADEQUATE
TRICHROME RESULT: NONE SEEN

## 2018-01-03 LAB — FECAL LEUKOCYTES

## 2018-01-07 ENCOUNTER — Other Ambulatory Visit: Payer: Self-pay

## 2018-01-07 DIAGNOSIS — R197 Diarrhea, unspecified: Secondary | ICD-10-CM

## 2018-01-08 ENCOUNTER — Other Ambulatory Visit: Payer: Medicare Other

## 2018-01-08 DIAGNOSIS — R197 Diarrhea, unspecified: Secondary | ICD-10-CM

## 2018-01-09 ENCOUNTER — Other Ambulatory Visit: Payer: Self-pay | Admitting: Orthopedic Surgery

## 2018-01-09 DIAGNOSIS — G5601 Carpal tunnel syndrome, right upper limb: Secondary | ICD-10-CM | POA: Diagnosis not present

## 2018-01-10 ENCOUNTER — Encounter: Payer: Self-pay | Admitting: Internal Medicine

## 2018-01-10 ENCOUNTER — Ambulatory Visit (INDEPENDENT_AMBULATORY_CARE_PROVIDER_SITE_OTHER): Payer: Medicare Other | Admitting: Internal Medicine

## 2018-01-10 ENCOUNTER — Other Ambulatory Visit (INDEPENDENT_AMBULATORY_CARE_PROVIDER_SITE_OTHER): Payer: Medicare Other

## 2018-01-10 ENCOUNTER — Telehealth: Payer: Self-pay | Admitting: *Deleted

## 2018-01-10 VITALS — BP 128/82 | HR 72 | Ht 62.0 in | Wt 161.0 lb

## 2018-01-10 DIAGNOSIS — K529 Noninfective gastroenteritis and colitis, unspecified: Secondary | ICD-10-CM | POA: Diagnosis not present

## 2018-01-10 DIAGNOSIS — R197 Diarrhea, unspecified: Secondary | ICD-10-CM

## 2018-01-10 DIAGNOSIS — I2510 Atherosclerotic heart disease of native coronary artery without angina pectoris: Secondary | ICD-10-CM | POA: Diagnosis not present

## 2018-01-10 DIAGNOSIS — K219 Gastro-esophageal reflux disease without esophagitis: Secondary | ICD-10-CM | POA: Diagnosis not present

## 2018-01-10 DIAGNOSIS — R159 Full incontinence of feces: Secondary | ICD-10-CM

## 2018-01-10 DIAGNOSIS — R152 Fecal urgency: Secondary | ICD-10-CM

## 2018-01-10 DIAGNOSIS — K580 Irritable bowel syndrome with diarrhea: Secondary | ICD-10-CM | POA: Diagnosis not present

## 2018-01-10 LAB — CBC WITH DIFFERENTIAL/PLATELET
BASOS ABS: 0.1 10*3/uL (ref 0.0–0.1)
Basophils Relative: 0.9 % (ref 0.0–3.0)
EOS ABS: 0.2 10*3/uL (ref 0.0–0.7)
Eosinophils Relative: 2 % (ref 0.0–5.0)
HCT: 37 % (ref 36.0–46.0)
HEMOGLOBIN: 12.6 g/dL (ref 12.0–15.0)
LYMPHS ABS: 1.8 10*3/uL (ref 0.7–4.0)
Lymphocytes Relative: 23.3 % (ref 12.0–46.0)
MCHC: 34.1 g/dL (ref 30.0–36.0)
MCV: 87.7 fl (ref 78.0–100.0)
MONO ABS: 0.7 10*3/uL (ref 0.1–1.0)
Monocytes Relative: 8.6 % (ref 3.0–12.0)
NEUTROS PCT: 65.2 % (ref 43.0–77.0)
Neutro Abs: 5.1 10*3/uL (ref 1.4–7.7)
Platelets: 272 10*3/uL (ref 150.0–400.0)
RBC: 4.22 Mil/uL (ref 3.87–5.11)
RDW: 13.4 % (ref 11.5–15.5)
WBC: 7.8 10*3/uL (ref 4.0–10.5)

## 2018-01-10 LAB — COMPREHENSIVE METABOLIC PANEL
ALT: 21 U/L (ref 0–35)
AST: 22 U/L (ref 0–37)
Albumin: 4.6 g/dL (ref 3.5–5.2)
Alkaline Phosphatase: 91 U/L (ref 39–117)
BUN: 22 mg/dL (ref 6–23)
CHLORIDE: 100 meq/L (ref 96–112)
CO2: 29 meq/L (ref 19–32)
Calcium: 10.1 mg/dL (ref 8.4–10.5)
Creatinine, Ser: 1.11 mg/dL (ref 0.40–1.20)
GFR: 51.08 mL/min — AB (ref >60.00)
Glucose, Bld: 104 mg/dL — ABNORMAL HIGH (ref 70–99)
POTASSIUM: 3.5 meq/L (ref 3.5–5.1)
Sodium: 139 mEq/L (ref 135–145)
Total Bilirubin: 0.5 mg/dL (ref 0.2–1.2)
Total Protein: 7.8 g/dL (ref 6.0–8.3)

## 2018-01-10 LAB — IGA: IGA: 349 mg/dL (ref 68–378)

## 2018-01-10 MED ORDER — SUPREP BOWEL PREP KIT 17.5-3.13-1.6 GM/177ML PO SOLN: 1.0000 | ORAL | 0 refills | Status: DC

## 2018-01-10 MED ORDER — PANCRELIPASE (LIP-PROT-AMYL) 20000-63000 UNITS PO CPEP: 3.0000 | ORAL_CAPSULE | Freq: Three times a day (TID) | ORAL | 0 refills | Status: DC

## 2018-01-10 MED FILL — SUPREP BOWEL PREP KIT: 17.5-3.13-1 | 1 days supply | Qty: 354 | Fill #0

## 2018-01-10 NOTE — Telephone Encounter (Signed)
Patient states that she is taking vitamin D 5,000 units daily. Dr Hilarie Fredrickson indicates that this dosage is fine. Patient also indicates that she forgot to let Dr Hilarie Fredrickson know that she has been taking Magnesium for leg cramps but just stopped on 01/01/18 after her cardiologist explained that it may cause change in her bowels. I advised that indeed this can cause diarrhea and she should absolutely avoid magnesium. She states that she has noticed that her bowels have gotten somewhat better already since this change.

## 2018-01-10 NOTE — Telephone Encounter (Signed)
-----   Message from Jerene Bears, MD sent at 01/10/2018 11:12 AM EDT ----- Please contact pt to see what dose of Vit D she is taking daily In the past she increased her dose to 10,000 U daily.  I think perhaps Vit D at too high of a dose could contribute to her loose stools/gi trouble

## 2018-01-10 NOTE — Patient Instructions (Addendum)
You have been scheduled for a colonoscopy. Please follow written instructions given to you at your visit today.  Please pick up your prep supplies at the pharmacy within the next 1-3 days. If you use inhalers (even only as needed), please bring them with you on the day of your procedure. Your physician has requested that you go to www.startemmi.com and enter the access code given to you at your visit today. This web site gives a general overview about your procedure. However, you should still follow specific instructions given to you by our office regarding your preparation for the procedure.  You have been given a FODMAP diet to look over and follow.  Your provider has requested that you go to the basement level for lab work before leaving today. Press "B" on the elevator. The lab is located at the first door on the left as you exit the elevator.  Please use your lomotil as needed (you should have this as home).  We have given you samples of the following medication to take: Zenpep 20,000 units- Take 3 capsules with each meal, 1-2 capsules with each snack.  Let us know in the next 5 days or so how the Zenpep is working for the diarrhea.  Discontinue the prilosec (omeprazole) for now.  If you are age 68 or older, your body mass index should be between 23-30. Your Body mass index is 29.45 kg/m. If this is out of the aforementioned range listed, please consider follow up with your Primary Care Provider.  If you are age 16 or younger, your body mass index should be between 19-25. Your Body mass index is 29.45 kg/m. If this is out of the aformentioned range listed, please consider follow up with your Primary Care Provider.

## 2018-01-10 NOTE — Progress Notes (Signed)
Subjective:    Patient ID: [name], female    DOB: [birth year], 72 y.o.   MRN: [id number]  HPI [name] is a 72 year old female with a history of IBS, colonic diverticulosis with history of diverticulitis, history of adenomatous colon polyps, GERD who is here for follow-up.  She is here alone today and was last seen on 10/09/2017.  She also has a history of HLA-B27 ankylosing spondylitis, arthritis, hypertension, hyperlipidemia, hypothyroidism, osteoporosis and vitamin D deficiency.  Patient reports she is continued to struggle with intermittent loose stools, episodes of fecal incontinence, at times crampy lower abdominal pain predominantly left-sided.  She states that she has become afraid to sleep because she is worried about having accidents of fecal incontinence.  She is having foul-smelling gas and also foul-smelling stool.  She has been off of her probiotic.  She is unsure of what diet to eat because some foods tend to trigger her loose stools such as salads.  She is occasionally seeing some red blood with wiping which she has attributed to hemorrhoids.  One episode in May she had diffuse sweating which lasted more than an hour.  She denies having flushing symptom and diaphoresis on a regular basis.  Her weight has been stable.  No known fever.  She is off of her Prilosec.  Heartburn is well controlled except when she eats products like beef.  She is feeling frustrated with her loose stools, the unpredictability of her stooling and also inability to control bowel movements.  She has not tried Lomotil.  She submitted stool samples and her GI pathogen panel was indeterminate.  Fecal leukocytes were negative and ova and parasite was negative.  Symptoms have been present for greater than 3 months time.  She did not benefit after taking a course of rifaximin.  I performed a CT scan of her abdomen pelvis with contrast last summer 2018 for ongoing left lower quadrant abdominal pain and this was  unremarkable.  Review of Systems As per HPI, otherwise negative  Current Medications, Allergies, Past Medical History, Past Surgical History, Family History and Social History were reviewed in [employer] record.     Objective:   Physical Exam BP 128/82   Pulse 72   Ht 5' 2"  (1.575 m)   Wt 161 lb (73 kg)   BMI 29.45 kg/m  Constitutional: Well-developed and well-nourished. No distress. HEENT: Normocephalic and atraumatic Conjunctivae are normal.  No scleral icterus. Neck: Neck supple. Trachea midline. Cardiovascular: Normal rate, regular rhythm and intact distal pulses.  Pulmonary/chest: Effort normal and breath sounds normal. No wheezing, rales or rhonchi. Abdominal: Soft, mild left lower quadrant tenderness without rebound or guarding, nondistended. Bowel sounds active throughout. There are no masses palpable. No hepatosplenomegaly. Extremities: no clubbing, cyanosis, or edema Neurological: Alert and oriented to person place and time. Skin: Skin is warm and dry. Psychiatric: Normal mood and affect. Behavior is normal.  CBC    Component Value Date/Time   WBC 5.4 11/01/2017 1034   RBC 4.41 11/01/2017 1034   HGB 12.9 11/01/2017 1034   HCT 38.7 11/01/2017 1034   PLT 238.0 11/01/2017 1034   MCV 87.8 11/01/2017 1034   MCH 29.7 05/20/2017 2243   MCHC 33.3 11/01/2017 1034   RDW 13.4 11/01/2017 1034   LYMPHSABS 1.7 11/01/2017 1034   MONOABS 0.4 11/01/2017 1034   EOSABS 0.2 11/01/2017 1034   BASOSABS 0.1 11/01/2017 1034   CMP     Component Value Date/Time   NA 139  11/01/2017 1034   NA 141 08/06/2017 0924   K 4.3 11/01/2017 1034   CL 104 11/01/2017 1034   CO2 28 11/01/2017 1034   GLUCOSE 101 (H) 11/01/2017 1034   GLUCOSE 99 07/23/2006 1101   BUN 18 11/01/2017 1034   BUN 15 08/06/2017 0924   CREATININE 1.09 11/01/2017 1034   CREATININE 1.06 (H) 09/24/2017 1006   CALCIUM 9.7 11/01/2017 1034   PROT 7.3 11/01/2017 1034   PROT 6.8 08/06/2017 0924    ALBUMIN 4.2 11/01/2017 1034   ALBUMIN 4.4 08/06/2017 0924   AST 23 11/01/2017 1034   ALT 23 11/01/2017 1034   ALKPHOS 84 11/01/2017 1034   BILITOT 0.5 11/01/2017 1034   BILITOT 0.5 08/06/2017 0924   GFRNONAA 52 (L) 09/24/2017 1006   GFRAA 60 09/24/2017 1006   Fecal WBC neg Ova and parasite neg GI pathogen indeterminate, repeat pending  CT ABDOMEN AND PELVIS WITH CONTRAST   TECHNIQUE: Multidetector CT imaging of the abdomen and pelvis was performed using the standard protocol following bolus administration of intravenous contrast.   CONTRAST:  100 cc Isovue-300   COMPARISON:  08/14/2013   FINDINGS: Lower chest: The lung bases are grossly clear. No pleural effusions or pleural lesion. The heart is normal in size. No pericardial effusion. The distal esophagus is grossly normal. Small hiatal hernia   Hepatobiliary: No focal hepatic lesions or intrahepatic biliary dilatation. The portal and hepatic veins are patent. The gallbladder is normal. No common bile duct dilatation.   Pancreas: No mass, inflammation or ductal dilatation.   Spleen: Normal size.  No focal lesions.   Adrenals/Urinary Tract: The adrenal glands, kidneys and bladder are unremarkable. Right-sided parapelvic cysts is noted.   Stomach/Bowel: The stomach, duodenum, small bowel and colon are unremarkable. No acute inflammatory process, mass lesions or obstructive findings. There is fairly extensive sigmoid diverticulosis without findings for acute diverticulitis. The terminal ileum is normal. The appendix is normal.   Vascular/Lymphatic: Small scattered mesenteric and retroperitoneal lymph nodes but no mass or adenopathy. The aorta and branch vessels are patent. The major venous structures are patent.   Reproductive: Status post hysterectomy. Evidence of pelvic relaxation with moderate cystocele.   Other: No pelvic mass or adenopathy. No free pelvic fluid collections. No inguinal mass or adenopathy. No  abdominal wall hernia or subcutaneous lesions.   Musculoskeletal: No significant bony findings.   IMPRESSION: 1. No acute abdominal/pelvic findings, mass lesions or adenopathy. 2. Sigmoid diverticulosis without findings for acute diverticulitis. 3. Status post hysterectomy with pelvic relaxation and moderate cystocele.     Electronically Signed   By: Marijo Sanes M.D.   On: 02/14/2017 09:40       Assessment & Plan:  72 year old female with a history of IBS, colonic diverticulosis with history of diverticulitis, history of adenomatous colon polyps, GERD who is here for follow-up.  1.  IBS with loose stool/fecal urgency/history of diverticulosis and diverticulitis --no evidence for diverticulitis based on clinical exam.  She did not respond to rifaximin.  Symptoms are causing some considerable stress for her which is understandable.  At this point I recommended the following: --Repeat colonoscopy; rule out microscopic colitis, segmental colitis; risks, benefits and alternatives discussed and she is agreeable and wishes to proceed. --Follow-up results of GI pathogen panel which should result in the next few days --CBC, CMP repeat celiac panel --24-hour urine 5 HIAA to exclude carcinoid syndrome (one episode of flushing and diaphoresis, though this has not persisted) --FODMAP diet --Trial of Zenpep 20,000  units 3 capsules with meals, 1-2 with snacks.  Notify me after 5 to 7 days as to if this was beneficial --On reviewing notes, I noticed that she increased her vitamin D to 10,000 units daily.  I do not know if she is still taking this high dose of vitamin D but it may cause GI upset.  I will have Dottie contact her to see what dose she is taking  2 GERD --off of Prilosec for now.  She is using diet to control her reflux symptoms currently with success  25 minutes spent with the patient today. Greater than 50% was spent in counseling and coordination of care with the patient

## 2018-01-11 LAB — TISSUE TRANSGLUTAMINASE, IGA: (tTG) Ab, IgA: 1 U/mL

## 2018-01-12 LAB — T4, FREE: Free T4: 1.16 ng/dL (ref 0.82–1.77)

## 2018-01-12 LAB — T3, FREE: T3, Free: 3.2 pg/mL (ref 2.0–4.4)

## 2018-01-12 LAB — SPECIMEN STATUS REPORT

## 2018-01-14 LAB — GASTROINTESTINAL PATHOGEN PANEL PCR
C. difficile Tox A/B, PCR: NOT DETECTED
CAMPYLOBACTER, PCR: NOT DETECTED
Cryptosporidium, PCR: NOT DETECTED
E COLI (STEC) STX1/STX2, PCR: NOT DETECTED
E coli (ETEC) LT/ST PCR: NOT DETECTED
E coli 0157, PCR: NOT DETECTED
Giardia lamblia, PCR: NOT DETECTED
NOROVIRUS, PCR: NOT DETECTED
ROTAVIRUS, PCR: NOT DETECTED
SALMONELLA, PCR: NOT DETECTED
SHIGELLA, PCR: NOT DETECTED

## 2018-01-15 ENCOUNTER — Telehealth: Payer: Self-pay | Admitting: Internal Medicine

## 2018-01-15 ENCOUNTER — Telehealth: Payer: Self-pay

## 2018-01-15 NOTE — Telephone Encounter (Signed)
Spoke with patient who would like to be seen for back and left hip pain. Unsure of cause of pain. Unable to move around the house a lot. Had to cancel colonoscopy. Has tried ice and Tylenol to alleviate pain. Will come in tomorrow to see Dr. Tamala Julian.

## 2018-01-15 NOTE — Telephone Encounter (Signed)
Patient was advised of negative stool studies. She states that she needs to hold off on colonoscopy for tomorrow due to some left sided hip pain she started having yesterday evening. Pain precludes her from moving her legs in bending position or laying on her left side a this time, both of which would be needed for colonoscopy. She does have an appointment with Dr Lois Huxley for tomorrow to hopefully get some help with the hip pain. She has been rescheduled for colonoscopy on 01/23/18 at 900 am. She verbalizes understanding.

## 2018-01-15 NOTE — Progress Notes (Signed)
[name] [place], [place] [address] Phone: [phone] Subjective:     CC: Left hip pain  JJK:[id number]  [name] is a 72 y.o. female coming in with complaint of left hip pain. Patient has been having pain for 1 week. Pain in the hip started out as "twinges" of pain but as pain progressed patient notes not being able to stand up straight on Sunday. Pain does radiate down the leg.  Patient states it was severe.  Had difficulty getting out of the bed 2 days ago.      Past Medical History:  Diagnosis Date  . Allergy    seasonal  . Ankylosing spondylitis ([place])   . Anxiety   . Arthritis   . Bell palsy   . CTS (carpal tunnel syndrome)   . Cystitis   . Diverticulitis   . Diverticulosis of colon   . Dry eye syndrome   . Frozen shoulder   . GERD (gastroesophageal reflux disease)   . HLA B27 (HLA B27 positive)   . Hx of colonic polyps   . Hyperlipidemia    NMR 2005  . Hypertension   . Hypothyroidism   . Incontinence   . Internal hemorrhoids   . Interstitial cystitis   . Kidney stone   . Menopause   . Osteoarthritis   . Osteopenia    [facility]  . S/P total hysterectomy and bilateral salpingo-oophorectomy 04/12/2014  . Sleep apnea    c-pap  . Subjective visual disturbance of both eyes 12/30/2013  . Tubular adenoma of colon   . Vitamin D deficiency   . Xerostomia    and Xeroophthalmia   Past Surgical History:  Procedure Laterality Date  . ABDOMINAL HYSTERECTOMY    . BLADDER SURGERY     for incontinence  . CARPAL TUNNEL RELEASE Left 12/22/2014   Procedure: LEFT CARPAL TUNNEL RELEASE;  Surgeon: Daryll Brod, MD;  Location: [facility];  Service: Orthopedics;  Laterality: Left;  . COLONOSCOPY W/ POLYPECTOMY     adenomatous poyp 04-2008  . colonscopy     tics 10-2002  . CYSTOSCOPY WITH RETROGRADE PYELOGRAM, URETEROSCOPY AND STENT PLACEMENT Left 09/01/2013   Procedure: CYSTOSCOPY WITH  RETROGRADE PYELOGRAM, AND LEFT STENT PLACEMENT;  Surgeon: Molli Hazard, MD;  Location: [facility];  Service: Urology;  Laterality: Left;  . elevated LFTs     due to Elmiron  . FINGER SURGERY Left 2004   2nd finger  . G 1 P 1    . interstitial cystitis     with clot in catheter  . SHOULDER ARTHROSCOPY Left   . TOTAL ABDOMINAL HYSTERECTOMY W/ BILATERAL SALPINGOOPHORECTOMY     dysfunctional menses   Social History   Socioeconomic History  . Marital status: Married    Spouse name: Not on file  . Number of children: 1  . Years of education: BS  . Highest education level: Not on file  Occupational History  . Occupation: retired  . Occupation: Pharmacist, hospital    Comment: Kindergarten  Social Needs  . Financial resource strain: Not hard at all  . Food insecurity:    Worry: Never true    Inability: Never true  . Transportation needs:    Medical: No    Non-medical: No  Tobacco Use  . Smoking status: Never Smoker  . Smokeless tobacco: Never Used  Substance and Sexual Activity  . Alcohol use: Not Currently  Comment:      . Drug use: No  . Sexual activity: Not Currently  Lifestyle  . Physical activity:    Days per week: 0 days    Minutes per session: 0 min  . Stress: Rather much  Relationships  . Social connections:    Talks on phone: More than three times a week    Gets together: More than three times a week    Attends religious service: Not on file    Active member of club or organization: Not on file    Attends meetings of clubs or organizations: Not on file    Relationship status: Married  Other Topics Concern  . Not on file  Social History Narrative   Lives   Caffeine use:    Married      Allergies  Allergen Reactions  . Nitroglycerin Anaphylaxis    Hypotensive after NTG administration in ER during chest pain evaluation Pt states she almost died,  Pt states in a procedure done in 10/2016 she had no reaction to this medication, so not sure if it is an actual  allergy.   . Pentosan Polysulfate Sodium     Elevated LFTs.......Marland Kitchen elmiron   . Sulfamethoxazole-Trimethoprim Nausea Only    Unknown reaction per pt, Pt thinks she may have felt sick to her stomach.   . Bactrim [Sulfamethoxazole-Trimethoprim]   . Promethazine Hcl     "jittery on the inside"  . Lisinopril Cough   Family History  Problem Relation Age of Onset  . Coronary artery disease Mother   . Hypertension Mother   . Asthma Mother   . Osteoporosis Mother   . Dementia Mother   . Stroke Mother        mini cva  . Diabetes Father   . Cirrhosis Father        non alcoholic  . Cancer Sister        colon  . Colon cancer Sister 62  . Cancer Paternal Aunt        2 aunts had renal cancer  . Coronary artery disease Maternal Grandfather   . Cancer Maternal Aunt        breast  . Diabetes Paternal Aunt   . Diabetes Paternal Grandmother      Past medical history, social, surgical and family history all reviewed in electronic medical record.  No pertanent information unless stated regarding to the chief complaint.   Review of Systems:Review of systems updated and as accurate as of 01/16/18  No headache, visual changes, nausea, vomiting, diarrhea, constipation, dizziness, abdominal pain, skin rash, fevers, chills, night sweats, weight loss, swollen lymph nodes, body aches, joint swelling, chest pain, shortness of breath, mood changes.  Positive muscle aches  Objective  Blood pressure 124/82, pulse 68, height 5' 2" (1.575 m), weight 159 lb (72.1 kg), SpO2 98 %. Systems examined below as of 01/16/18   General: No apparent distress alert and oriented x3 mood and affect normal, dressed appropriately.  HEENT: Pupils equal, extraocular movements intact  Respiratory: Patient's speak in full sentences and does not appear short of breath  Cardiovascular: No lower extremity edema, non tender, no erythema  Skin: Warm dry intact with no signs of infection or rash on extremities or on axial  skeleton.  Abdomen: Soft nontender  Neuro: Cranial nerves II through XII are intact, neurovascularly intact in all extremities with 2+ DTRs and 2+ pulses.  Lymph: No lymphadenopathy of posterior or anterior cervical chain or axillae bilaterally.  Gait mild  antalgic MSK:  tender with mild limited range of motion and good stability and symmetric strength and tone of shoulders, elbows, wrist, hip, knee and ankles bilaterally.   Patient back exam shows loss of lordosis.  Tenderness to palpation over the left sacroiliac joint.  Negative straight leg test.  Some tightness with Corky Sox test.  Patient neurovascular intact distally with 5 out of 5 strength of the feet bilaterally.  Good capillary refill.     Impression and Recommendations:     This case required medical decision making of moderate complexity.      Note: This dictation was prepared with Dragon dictation along with smaller phrase technology. Any transcriptional errors that result from this process are unintentional.

## 2018-01-16 ENCOUNTER — Encounter: Payer: Self-pay | Admitting: Internal Medicine

## 2018-01-16 ENCOUNTER — Ambulatory Visit (INDEPENDENT_AMBULATORY_CARE_PROVIDER_SITE_OTHER)
Admission: RE | Admit: 2018-01-16 | Discharge: 2018-01-16 | Disposition: A | Discharge status: Home / Self Care | Payer: Medicare Other | Source: Ambulatory Visit | Attending: Family Medicine | Admitting: Family Medicine

## 2018-01-16 ENCOUNTER — Ambulatory Visit (INDEPENDENT_AMBULATORY_CARE_PROVIDER_SITE_OTHER): Payer: Medicare Other | Admitting: Family Medicine

## 2018-01-16 VITALS — BP 124/82 | HR 68 | Ht 62.0 in | Wt 159.0 lb

## 2018-01-16 DIAGNOSIS — M25552 Pain in left hip: Secondary | ICD-10-CM

## 2018-01-16 DIAGNOSIS — M48061 Spinal stenosis, lumbar region without neurogenic claudication: Secondary | ICD-10-CM | POA: Diagnosis not present

## 2018-01-16 DIAGNOSIS — M5416 Radiculopathy, lumbar region: Secondary | ICD-10-CM | POA: Diagnosis not present

## 2018-01-16 DIAGNOSIS — I2510 Atherosclerotic heart disease of native coronary artery without angina pectoris: Secondary | ICD-10-CM

## 2018-01-16 DIAGNOSIS — M47816 Spondylosis without myelopathy or radiculopathy, lumbar region: Secondary | ICD-10-CM | POA: Diagnosis not present

## 2018-01-16 MED ORDER — GABAPENTIN 100 MG PO CAPS: 100.0000 mg | ORAL_CAPSULE | Freq: Every day | ORAL | 3 refills | Status: DC

## 2018-01-16 MED ORDER — PREDNISONE 20 MG PO TABS: 40.0000 mg | ORAL_TABLET | Freq: Every day | ORAL | 0 refills | Status: DC

## 2018-01-16 MED FILL — predniSONE 20 MG TABS: 20 | 5 days supply | Qty: 10 | Fill #0

## 2018-01-16 MED FILL — GABAPENTIN 100 MG CAPSULE: 100 | 30 days supply | Qty: 30 | Fill #0

## 2018-01-16 NOTE — Patient Instructions (Signed)
I am sorry you are hurting Likely coming from your back  Gabapentin 100mg  at night- you will not have side effects I am pretty sure Prednisone daily for 5 days  Xrays downstairs Lets make sure this calms down, you are ok to do the colonoscopy next week  See me again in 3-4 weeks or call us sooner

## 2018-01-16 NOTE — Assessment & Plan Note (Signed)
Has had difficulty with lumbar radiculopathy previously.  Seems to be having exacerbation.  X-rays ordered today to further evaluate for any bony abnormality that could be contributing.  Gabapentin given but patient has been noncompliant on multiple medications over the course of time.  We also discussed formal physical therapy which patient declined.  Patient will do some icing regimen.  Discussed anti-inflammatories.  Follow-up again in 4 weeks

## 2018-01-18 MED FILL — ROSUVASTATIN CALCIUM 5 MG T: 5 | 30 days supply | Qty: 30 | Fill #9

## 2018-01-21 ENCOUNTER — Ambulatory Visit (INDEPENDENT_AMBULATORY_CARE_PROVIDER_SITE_OTHER): Payer: Medicare Other | Admitting: Pharmacist

## 2018-01-21 ENCOUNTER — Encounter: Payer: Self-pay | Admitting: Pharmacist

## 2018-01-21 VITALS — BP 128/72 | HR 58

## 2018-01-21 DIAGNOSIS — I10 Essential (primary) hypertension: Secondary | ICD-10-CM | POA: Diagnosis not present

## 2018-01-21 DIAGNOSIS — I2510 Atherosclerotic heart disease of native coronary artery without angina pectoris: Secondary | ICD-10-CM | POA: Diagnosis not present

## 2018-01-21 NOTE — Patient Instructions (Addendum)
BMET today  Return for a follow up appointment as scheduled with Dr. Meda Coffee   Your blood pressure goal is less than 130/80  Check your blood pressure at home daily (if able) and keep record of the readings.  Take your BP meds as follows: CONTINUE hydrochlorothiazide 25mg  daily.   If pressures drop below 110/60 sitting please call as we may need to adjust medications  [phone]   Bring all of your meds, your BP cuff and your record of home blood pressures to your next appointment.  Exercise as you're able, try to walk approximately 30 minutes per day.  Keep salt intake to a minimum, especially watch canned and prepared boxed foods.  Eat more fresh fruits and vegetables and fewer canned items.  Avoid eating in fast food restaurants.    HOW TO TAKE YOUR BLOOD PRESSURE: . Rest 5 minutes before taking your blood pressure. .  Don't smoke or drink caffeinated beverages for at least 30 minutes before. . Take your blood pressure before (not after) you eat. . Sit comfortably with your back supported and both feet on the floor (don't cross your legs). . Elevate your arm to heart level on a table or a desk. . Use the proper sized cuff. It should fit smoothly and snugly around your bare upper arm. There should be enough room to slip a fingertip under the cuff. The bottom edge of the cuff should be 1 inch above the crease of the elbow. . Ideally, take 3 measurements at one sitting and record the average.

## 2018-01-21 NOTE — Progress Notes (Signed)
Patient ID: [name]                 DOB: Nov 10, 1945                      MRN: [id number]     HPI: [name] is a 72 y.o. female patient of Dr. Meda Coffee who presents today for hypertension evaluation. PMH significant for hypertension, hyperlipidemia who was previously seen in our office in 2014 for concerns of chest pain and underwent an exercise nuclear stress test that showed LVEF of 70% and no prior infarct or ischemia. At her most recent OV her Toprol was stopped due to bradycardia and dizziness. She was started on HCTZ for BP control and intermittent lower edema. BMET 10 days later was stable. It was discussed to add losartan today if pressures not controlled.   She presents today for discussion of blood pressure. I have previously met this patient when I was managing her husbands blood pressure. She has several questions about medication unrelated to her heart and I deferred most of these questions to their respective specialties. She does report today that she is feeling better off the Toprol. She states she has occasionally gotten dizzy upon standing or after standing for long periods. She did just finish a steroid course. She is working to lose weight. She did have leg pains a few days after starting the HCTZ, but this has improved.    Current HTN meds:  Hydrochlorothiazide 78m daily in the morning  Previously tried: lisinopril, Toprol - bradycardia   BP goal: <130/80  Family History: Asthma in her mother; Cancer in her maternal aunt, paternal aunt, and sister; Cirrhosis in her father; Colon cancer (age of onset: 669 in her sister; Coronary artery disease in her maternal grandfather and mother; Dementia in her mother; Diabetes in her father, paternal aunt, and paternal grandmother; Hypertension in her mother; Osteoporosis in her mother; Stroke in her mother.   Social History: denies tobacco and alcohol  Diet: Most meals from home, when does eat out from [employer]  Does not eat salad due to GI complications. She has tried to increase her water intake. She does drink sweet and unsweet tea. She does drink cola, but has tried to cut back.   Exercise: She has been working in the yard prior to her back issues.   Home BP readings: 120s/70-80s  Wt Readings from Last 3 Encounters:  01/16/18 159 lb (72.1 kg)  01/10/18 161 lb (73 kg)  12/31/17 164 lb 6.4 oz (74.6 kg)   BP Readings from Last 3 Encounters:  01/21/18 128/72  01/16/18 124/82  01/10/18 128/82   Pulse Readings from Last 3 Encounters:  01/21/18 (!) 58  01/16/18 68  01/10/18 72    Renal function: Estimated Creatinine Clearance: 37.9 mL/min (A) (by C-G formula based on SCr of 1.23 mg/dL (H)).  Past Medical History:  Diagnosis Date  . Allergy    seasonal  . [place]   . Anxiety   . Arthritis   . Bell palsy   . CTS (carpal tunnel syndrome)   . Cystitis   . Diverticulitis   . Diverticulosis of colon   . Dry eye syndrome   . Frozen shoulder   . GERD (gastroesophageal reflux disease)   . HLA B27 (HLA B27 positive)   . Hx of colonic polyps   . Hyperlipidemia    NMR 2005  . Hypertension   . Hypothyroidism   .  Incontinence   . Internal hemorrhoids   . Interstitial cystitis   . Kidney stone   . Menopause   . Osteoarthritis   . Osteopenia    [facility] done [facility]  . S/P total hysterectomy and bilateral salpingo-oophorectomy 04/12/2014  . Sleep apnea    c-pap  . Subjective visual disturbance of both eyes 12/30/2013  . Tubular adenoma of colon   . Vitamin D deficiency   . Xerostomia    and Xeroophthalmia    Current Outpatient Medications on File Prior to Visit  Medication Sig Dispense Refill  . acetaminophen (TYLENOL) 500 MG tablet Take 500 mg by mouth every 4 (four) hours as needed for mild pain.     . Ascorbic Acid (VITAMIN C) 1000 MG tablet Take 1,000 mg by mouth daily.     Marland Kitchen aspirin 81 MG tablet Take 81 mg by mouth daily.      Marland Kitchen BIOTIN PO  Take 1 tablet by mouth daily.    . blood glucose meter kit and supplies KIT Dispense based on patient and insurance preference. Use up to four times daily as directed. prediabetes 1 each 0  . cetirizine (ZYRTEC) 10 MG tablet Take 10 mg by mouth daily.      . cycloSPORINE (RESTASIS) 0.05 % ophthalmic emulsion Place 1 drop into both eyes 2 times daily for 30 days.    . Diclofenac Sodium 2 % SOLN Place onto the skin as directed for pain as needed.    . hydrochlorothiazide (HYDRODIURIL) 25 MG tablet Take 1 tablet (25 mg total) by mouth daily. 90 tablet 2  . meloxicam (MOBIC) 15 MG tablet Take 1 tablet (15 mg total) by mouth daily as needed for pain. With food 90 tablet 2  . Pancrelipase, Lip-Prot-Amyl, (ZENPEP) 20000-63000 units CPEP Take 3 capsules by mouth 3 (three) times daily with meals. And 1-2 capsules with each snack 1 capsule 0  . Polyethyl Glycol-Propyl Glycol (SYSTANE OP) Apply 1 drop to eye as needed. Apply to both eyes as needed    . rosuvastatin (CRESTOR) 5 MG tablet Take 1 tablet (5 mg total) by mouth daily. 90 tablet 2  . vitamin B-12 (CYANOCOBALAMIN) 1000 MCG tablet Take 1,000 mcg by mouth daily.     . vitamin E 400 UNIT capsule Take 400 Units by mouth daily.    . diphenoxylate-atropine (LOMOTIL) 2.5-0.025 MG tablet Take 1 tablet by mouth 4 (four) times daily as needed for diarrhea or loose stools. (Patient not taking: Reported on 01/21/2018) 30 tablet 0  . gabapentin (NEURONTIN) 100 MG capsule Take 1 capsule (100 mg total) by mouth at bedtime. (Patient not taking: Reported on 01/21/2018) 30 capsule 3  . hyoscyamine (LEVSIN SL) 0.125 MG SL tablet Place 1 tablet (0.125 mg total) under the tongue every 4 (four) hours as needed. (Patient not taking: Reported on 01/21/2018) 15 tablet 1  . Omega-3 Fatty Acids (FISH OIL BURP-LESS) 1000 MG CAPS Takes 1000 mg BID 60 capsule 0  . SUPREP BOWEL PREP KIT 17.5-3.13-1.6 GM/177ML SOLN Take 1 kit by mouth as directed. For colonoscopy prep 2 Bottle 0   No  current facility-administered medications on file prior to visit.     Allergies  Allergen Reactions  . Nitroglycerin Anaphylaxis    Hypotensive after NTG administration in ER during chest pain evaluation Pt states she almost died,  Pt states in a procedure done in 10/2016 she had no reaction to this medication, so not sure if it is an actual  allergy.   . Pentosan Polysulfate Sodium     Elevated LFTs.......Marland Kitchen elmiron   . Sulfamethoxazole-Trimethoprim Nausea Only    Unknown reaction per pt, Pt thinks she may have felt sick to her stomach.   . Bactrim [Sulfamethoxazole-Trimethoprim]   . Promethazine Hcl     "jittery on the inside"  . Lisinopril Cough    Blood pressure 128/72, pulse (!) 58.  Seated BP - 132/82  Standing BP - 118/76  Assessment/Plan: Hypertension: BP today is at goal. Her pressures do drop significantly upon standing; thus will need to be careful about driving pressures too low as this could contribute to increased dizziness and falls. Will continue current regimen for now, though did advise that if pressures drop much lower to call as we may need to decrease HCTZ dose to prevent issues with orthostatics.   Thank you, Lelan Pons. Patterson Hammersmith, [place]  01/22/2018 7:34 AM  ADDENDUM: BMET with slightly elevated BUN. Will advise to increase fluid intake (water) and follow up BMET in 2 weeks. Potassium is WNL. Spoke with pt and advised. She will come for repeat BMET on 02/04/18.

## 2018-01-22 ENCOUNTER — Telehealth: Payer: Self-pay | Admitting: Pharmacist

## 2018-01-22 LAB — BASIC METABOLIC PANEL
BUN/Creatinine Ratio: 23 (ref 12–28)
BUN: 28 mg/dL — ABNORMAL HIGH (ref 8–27)
CO2: 26 mmol/L (ref 20–29)
Calcium: 10.3 mg/dL (ref 8.7–10.3)
Chloride: 99 mmol/L (ref 96–106)
Creatinine, Ser: 1.23 mg/dL — ABNORMAL HIGH (ref 0.57–1.00)
GFR calc Af Amer: 50 mL/min/{1.73_m2} — ABNORMAL LOW (ref >59)
GFR calc non Af Amer: 44 mL/min/{1.73_m2} — ABNORMAL LOW (ref >59)
Glucose: 151 mg/dL — ABNORMAL HIGH (ref 65–99)
Potassium: 3.8 mmol/L (ref 3.5–5.2)
Sodium: 138 mmol/L (ref 134–144)

## 2018-01-22 NOTE — Telephone Encounter (Signed)
Patient's daughter [name] called clinic with pt reports of lightheadedness today. She states she hasn't been able to eat today due to colonoscopy tomorrow. She normally takes her medications with food as well. She has not been able to check her BP yet today. Advised pt to skip her HCTZ tomorrow before her colonoscopy but to resume again once she can eat after her colonoscopy. She will monitor BP readings and call if she remains lightheaded once she resumes HCTZ and is eating normally. Suspect fasting today has contributed to symptoms. Pt's daughter verbalized understanding.

## 2018-01-23 ENCOUNTER — Ambulatory Visit (AMBULATORY_SURGERY_CENTER): Payer: Medicare Other | Admitting: Internal Medicine

## 2018-01-23 ENCOUNTER — Other Ambulatory Visit: Payer: Self-pay

## 2018-01-23 ENCOUNTER — Telehealth: Payer: Self-pay | Admitting: *Deleted

## 2018-01-23 ENCOUNTER — Encounter: Payer: Self-pay | Admitting: Internal Medicine

## 2018-01-23 VITALS — BP 144/78 | HR 53 | Temp 98.6°F | Resp 11 | Ht 62.0 in | Wt 159.0 lb

## 2018-01-23 DIAGNOSIS — I10 Essential (primary) hypertension: Secondary | ICD-10-CM | POA: Diagnosis not present

## 2018-01-23 DIAGNOSIS — K529 Noninfective gastroenteritis and colitis, unspecified: Secondary | ICD-10-CM

## 2018-01-23 DIAGNOSIS — D123 Benign neoplasm of transverse colon: Secondary | ICD-10-CM

## 2018-01-23 DIAGNOSIS — R197 Diarrhea, unspecified: Secondary | ICD-10-CM | POA: Diagnosis not present

## 2018-01-23 DIAGNOSIS — G4733 Obstructive sleep apnea (adult) (pediatric): Secondary | ICD-10-CM | POA: Diagnosis not present

## 2018-01-23 DIAGNOSIS — K635 Polyp of colon: Secondary | ICD-10-CM

## 2018-01-23 MED ORDER — SODIUM CHLORIDE 0.9 % IV SOLN: 500.0000 mL | Freq: Once | INTRAVENOUS | Status: DC

## 2018-01-23 MED ORDER — PANCRELIPASE (LIP-PROT-AMYL) 36000-114000 UNITS PO CPEP: 72000.0000 [IU] | ORAL_CAPSULE | Freq: Three times a day (TID) | ORAL | 3 refills | Status: DC

## 2018-01-23 MED ORDER — PANCRELIPASE (LIP-PROT-AMYL) 20000-63000 UNITS PO CPEP: 2.0000 | ORAL_CAPSULE | Freq: Three times a day (TID) | ORAL | 3 refills | Status: DC

## 2018-01-23 MED FILL — CREON DR 36,000 UNITS CAP: 36000 | 20 days supply | Qty: 160 | Fill #0

## 2018-01-23 NOTE — Progress Notes (Signed)
Report given to PACU, vss 

## 2018-01-23 NOTE — Addendum Note (Signed)
Addended by: Larina Bras on: 01/23/2018 03:01 PM   Modules accepted: Orders

## 2018-01-23 NOTE — Telephone Encounter (Signed)
Ok to substitute Creon (highest lipase dose) for Zenpep. Let pt know this substitute will be fine 2 capsules with meals, 1 with snacks

## 2018-01-23 NOTE — Telephone Encounter (Signed)
Patient's insurance will cover Creon, not Zenpep. Please advise.

## 2018-01-23 NOTE — Patient Instructions (Signed)
YOU HAD AN ENDOSCOPIC PROCEDURE TODAY AT [facility]:   Refer to the procedure report that was given to you for any specific questions about what was found during the examination.  If the procedure report does not answer your questions, please call your gastroenterologist to clarify.  If you requested that your care partner not be given the details of your procedure findings, then the procedure report has been included in a sealed envelope for you to review at your convenience later.  YOU SHOULD EXPECT: Some feelings of bloating in the abdomen. Passage of more gas than usual.  Walking can help get rid of the air that was put into your GI tract during the procedure and reduce the bloating. If you had a lower endoscopy (such as a colonoscopy or flexible sigmoidoscopy) you may notice spotting of blood in your stool or on the toilet paper. If you underwent a bowel prep for your procedure, you may not have a normal bowel movement for a few days.  Please Note:  You might notice some irritation and congestion in your nose or some drainage.  This is from the oxygen used during your procedure.  There is no need for concern and it should clear up in a day or so.  SYMPTOMS TO REPORT IMMEDIATELY:   Following lower endoscopy (colonoscopy or flexible sigmoidoscopy):  Excessive amounts of blood in the stool  Significant tenderness or worsening of abdominal pains  Swelling of the abdomen that is new, acute  Fever of 100F or higher  For urgent or emergent issues, a gastroenterologist can be reached at any hour by calling [phone].   DIET:  We do recommend a small meal at first, but then you may proceed to your regular diet.  Drink plenty of fluids but you should avoid alcoholic beverages for 24 hours.  ACTIVITY:  You should plan to take it easy for the rest of today and you should NOT DRIVE or use heavy machinery until tomorrow (because of the sedation medicines used during the test).     FOLLOW UP: Our staff will call the number listed on your records the next business day following your procedure to check on you and address any questions or concerns that you may have regarding the information given to you following your procedure. If we do not reach you, we will leave a message.  However, if you are feeling well and you are not experiencing any problems, there is no need to return our call.  We will assume that you have returned to your regular daily activities without incident.  If any biopsies were taken you will be contacted by phone or by letter within the next 1-3 weeks.  Please call us at [phone] if you have not heard about the biopsies in 3 weeks.   SIGNATURES/CONFIDENTIALITY: You and/or your care partner have signed paperwork which will be entered into your electronic medical record.  These signatures attest to the fact that that the information above on your After Visit Summary has been reviewed and is understood.  Full responsibility of the confidentiality of this discharge information lies with you and/or your care-partner.  Await pathology  Please read over handouts about polyps, diverticulosis and hemorrhoids  Dr. Vena Rua office nurse will call to set up a follow up office appointment for 3 months  Continue with Zenpep- 2 capsules with meals and 1 with snacks  Continue your normal medications

## 2018-01-23 NOTE — Telephone Encounter (Signed)
Rx sent 

## 2018-01-23 NOTE — Op Note (Signed)
[facility] Patient Name: [name] Procedure Date: 01/23/2018 8:56 AM MRN: [id number] Endoscopist: Jerene Bears , MD Age: 72 Referring MD:  Date of Birth: 1946/02/26 Gender: Female Account #: [id number] Procedure:                Colonoscopy Indications:              Chronic diarrhea, Clinically significant diarrhea                            of unexplained origin Medicines:                Monitored Anesthesia Care Procedure:                Pre-Anesthesia Assessment:                           - Prior to the procedure, a History and Physical                            was performed, and patient medications and                            allergies were reviewed. The patient's tolerance of                            previous anesthesia was also reviewed. The risks                            and benefits of the procedure and the sedation                            options and risks were discussed with the patient.                            All questions were answered, and informed consent                            was obtained. Prior Anticoagulants: The patient has                            taken no previous anticoagulant or antiplatelet                            agents. ASA Grade Assessment: II - A patient with                            mild systemic disease. After reviewing the risks                            and benefits, the patient was deemed in                            satisfactory condition to undergo the procedure.  After obtaining informed consent, the colonoscope                            was passed under direct vision. Throughout the                            procedure, the patient's blood pressure, pulse, and                            oxygen saturations were monitored continuously. The                            Model PCF-H190DL [id number]) scope was introduced                            through the anus and advanced to the  terminal                            ileum. The colonoscopy was performed without                            difficulty. The patient tolerated the procedure                            well. The quality of the bowel preparation was                            good. The terminal ileum, ileocecal valve,                            appendiceal orifice, and rectum were photographed. Scope In: 9:09:31 AM Scope Out: 9:28:03 AM Scope Withdrawal Time: 0 hours 13 minutes 13 seconds  Total Procedure Duration: 0 hours 18 minutes 32 seconds  Findings:                 The digital rectal exam was normal.                           The terminal ileum appeared normal.                           Two sessile polyps were found in the hepatic                            flexure. The polyps were 2 to 3 mm in size. These                            polyps were removed with a cold snare. Resection                            and retrieval were complete.                           Multiple small and large-mouthed diverticula were  found in the sigmoid colon.                           Internal hemorrhoids were found during                            retroflexion. The hemorrhoids were small.                           The exam was otherwise without abnormality.                           Biopsies for histology were taken with a cold                            forceps from the right colon and left colon for                            evaluation of microscopic colitis. Complications:            No immediate complications. Estimated Blood Loss:     Estimated blood loss was minimal. Impression:               - The examined portion of the ileum was normal.                           - Two 2 to 3 mm polyps at the hepatic flexure,                            removed with a cold snare. Resected and retrieved.                           - Diverticulosis in the sigmoid colon.                           - Small  internal hemorrhoids.                           - The examination was otherwise normal.                           - Biopsies were taken with a cold forceps from the                            right colon and left colon for evaluation of                            microscopic colitis. Recommendation:           - Patient has a contact number available for                            emergencies. The signs and symptoms of potential                            delayed  complications were discussed with the                            patient. Return to normal activities tomorrow.                            Written discharge instructions were provided to the                            patient.                           - Resume previous diet.                           - Continue present medications. Given excellent                            response to Zenpep, please continue with 2 capsules                            with meals and 1 with snacks.                           - Await pathology results.                           - Repeat colonoscopy is recommended. The                            colonoscopy date will be determined after pathology                            results from today's exam become available for                            review.                           -- Office follow-up in about 3 months. Jerene Bears, MD 01/23/2018 9:33:04 AM This report has been signed electronically.

## 2018-01-23 NOTE — Telephone Encounter (Signed)
I have spoken to patient to advise that insurance prefers Creon over Zenpep. Rx sent for Creon. She verbalizes understanding.

## 2018-01-23 NOTE — Telephone Encounter (Signed)
-----   Message from Jerene Bears, MD sent at 01/23/2018  9:36 AM EDT ----- zenpep worked great Continue same strength 2 with meals 1 with snacks (give enough for 8 daily) JMP

## 2018-01-23 NOTE — Progress Notes (Signed)
Called to room to assist during endoscopic procedure.  Patient ID and intended procedure confirmed with present staff. Received instructions for my participation in the procedure from the performing physician.  

## 2018-01-24 ENCOUNTER — Telehealth: Payer: Self-pay | Admitting: *Deleted

## 2018-01-24 ENCOUNTER — Encounter: Payer: Self-pay | Admitting: Internal Medicine

## 2018-01-24 NOTE — Telephone Encounter (Signed)
Phone f/u completed

## 2018-01-24 NOTE — Telephone Encounter (Signed)
  Follow up Call-  Call back number 01/23/2018 01/27/2016  Post procedure Call Back phone  # [phone]  Permission to leave phone message Yes Yes  Some recent data might be hidden     Patient questions:  Do you have a fever, pain , or abdominal swelling? No. Pain Score  0 *  Have you tolerated food without any problems? Yes.    Have you been able to return to your normal activities? Yes.    Do you have any questions about your discharge instructions: Diet   No. Medications  Yes.   Follow up visit  No.  Do you have questions or concerns about your Care? No.  Actions: * If pain score is 4 or above: No action needed, pain <4.

## 2018-01-28 ENCOUNTER — Encounter: Payer: Self-pay | Admitting: Internal Medicine

## 2018-01-30 ENCOUNTER — Telehealth: Payer: Self-pay | Admitting: Pharmacist

## 2018-01-30 NOTE — Telephone Encounter (Signed)
Pt presented today for husband's visit with Dr. Meda Coffee. She states she has been feeling dizzy and poorly since restarting HCTZ. She was off of the medication for a few days post colonoscopy as she forgot to take it. Her pressures have all been below goal 130/80. Will stop HCTZ for now and have her continue to monitor. She will call in about 2 weeks to report on pressures.

## 2018-02-04 ENCOUNTER — Other Ambulatory Visit: Payer: Medicare Other | Admitting: *Deleted

## 2018-02-04 DIAGNOSIS — I10 Essential (primary) hypertension: Secondary | ICD-10-CM

## 2018-02-05 LAB — BASIC METABOLIC PANEL
BUN/Creatinine Ratio: 16 (ref 12–28)
BUN: 17 mg/dL (ref 8–27)
CO2: 25 mmol/L (ref 20–29)
Calcium: 9.9 mg/dL (ref 8.7–10.3)
Chloride: 108 mmol/L — ABNORMAL HIGH (ref 96–106)
Creatinine, Ser: 1.04 mg/dL — ABNORMAL HIGH (ref 0.57–1.00)
GFR calc Af Amer: 62 mL/min/{1.73_m2} (ref >59)
GFR calc non Af Amer: 53 mL/min/{1.73_m2} — ABNORMAL LOW (ref >59)
Glucose: 132 mg/dL — ABNORMAL HIGH (ref 65–99)
Potassium: 4.4 mmol/L (ref 3.5–5.2)
Sodium: 145 mmol/L — ABNORMAL HIGH (ref 134–144)

## 2018-02-11 MED FILL — CREON DR 36,000 UNITS CAP: 36000 | 20 days supply | Qty: 160 | Fill #1

## 2018-02-12 MED FILL — ROSUVASTATIN CALCIUM 5 MG T: 5 | 30 days supply | Qty: 30 | Fill #10

## 2018-02-12 NOTE — Telephone Encounter (Signed)
Pt called to report BP over the last few days. She reports that pressures have been as below over last 2 weeks:   99/70  122/80 HR 90 125/76 HR 65 100/69 HR 80 126/73 HR 67, standing 112/79 128/80 HR 76 131/82 HR 66 126/73 HR 64 123/81 HR 75 126/87 HR 79 108/42 HR 77 105/72 HR 85 114/82 HR 74 126/75 HR 72    In review of pressures MOST are at goal less than 130/80. Will remain off HCTZ. Did advised to continue to monitor and call if trend up. Encouraged her to continue to eat healthy and watch sodium intake. She will call if she has any issues with pressures or additional questions.

## 2018-02-13 ENCOUNTER — Ambulatory Visit (INDEPENDENT_AMBULATORY_CARE_PROVIDER_SITE_OTHER): Payer: Medicare Other | Admitting: Family Medicine

## 2018-02-13 ENCOUNTER — Encounter: Payer: Self-pay | Admitting: Family Medicine

## 2018-02-13 ENCOUNTER — Ambulatory Visit: Payer: Self-pay

## 2018-02-13 VITALS — BP 140/82 | HR 71 | Ht 62.0 in | Wt 161.0 lb

## 2018-02-13 DIAGNOSIS — M25512 Pain in left shoulder: Secondary | ICD-10-CM | POA: Diagnosis not present

## 2018-02-13 DIAGNOSIS — I2510 Atherosclerotic heart disease of native coronary artery without angina pectoris: Secondary | ICD-10-CM

## 2018-02-13 DIAGNOSIS — M19012 Primary osteoarthritis, left shoulder: Secondary | ICD-10-CM | POA: Diagnosis not present

## 2018-02-13 DIAGNOSIS — R5383 Other fatigue: Secondary | ICD-10-CM | POA: Diagnosis not present

## 2018-02-13 DIAGNOSIS — G8929 Other chronic pain: Secondary | ICD-10-CM

## 2018-02-13 DIAGNOSIS — M5416 Radiculopathy, lumbar region: Secondary | ICD-10-CM

## 2018-02-13 NOTE — Progress Notes (Signed)
[name] [place], [place] [address] Phone: [phone] Subjective:     CC: Hip pain  BUL:[id number]  [name] is a 72 y.o. female coming in with complaint of hip pain. Patient was on prednisone which helped decrease her pain. She has been using pennsaid to help with lower back pain. She does have radiating pain down the left leg.  Would state that it has improved but still not pain-free.  Patient also is having pain in the left shoulder and cervical spine. Has seemed to have gotten worse since last visit. Has not been doing any yard work to exacerbate her pain. Has had history of carpal tunnel but does describe a tingling into her hand. Has known rotator cuff arthropathy and has been 4 months since we have done the injection.     Past Medical History:  Diagnosis Date  . Allergy    seasonal  . Ankylosing spondylitis ([place])   . Anxiety   . Arthritis   . Bell palsy   . CTS (carpal tunnel syndrome)   . Cystitis   . Diverticulitis   . Diverticulosis of colon   . Dry eye syndrome   . Frozen shoulder   . GERD (gastroesophageal reflux disease)   . HLA B27 (HLA B27 positive)   . Hx of colonic polyps   . Hyperlipidemia    NMR 2005  . Hypertension   . Hypothyroidism   . Incontinence   . Internal hemorrhoids   . Interstitial cystitis   . Kidney stone   . Menopause   . Osteoarthritis   . Osteopenia    [facility]  . S/P total hysterectomy and bilateral salpingo-oophorectomy 04/12/2014  . Sleep apnea    c-pap  . Subjective visual disturbance of both eyes 12/30/2013  . Tubular adenoma of colon   . Vitamin D deficiency   . Xerostomia    and Xeroophthalmia   Past Surgical History:  Procedure Laterality Date  . ABDOMINAL HYSTERECTOMY    . BLADDER SURGERY     for incontinence  . CARPAL TUNNEL RELEASE Left 12/22/2014   Procedure: LEFT CARPAL TUNNEL RELEASE;  Surgeon: Daryll Brod, MD;  Location: [facility];  Service: Orthopedics;  Laterality: Left;  . COLONOSCOPY W/ POLYPECTOMY     adenomatous poyp 04-2008  . colonscopy     tics 10-2002  . CYSTOSCOPY WITH RETROGRADE PYELOGRAM, URETEROSCOPY AND STENT PLACEMENT Left 09/01/2013   Procedure: CYSTOSCOPY WITH RETROGRADE PYELOGRAM, AND LEFT STENT PLACEMENT;  Surgeon: Molli Hazard, MD;  Location: [facility];  Service: Urology;  Laterality: Left;  . elevated LFTs     due to Elmiron  . FINGER SURGERY Left 2004   2nd finger  . G 1 P 1    . interstitial cystitis     with clot in catheter  . SHOULDER ARTHROSCOPY Left   . TOTAL ABDOMINAL HYSTERECTOMY W/ BILATERAL SALPINGOOPHORECTOMY     dysfunctional menses   Social History   Socioeconomic History  . Marital status: Married    Spouse name: Not on file  . Number of children: 1  . Years of education: BS  . Highest education level: Not on file  Occupational History  . Occupation: retired  . Occupation: Pharmacist, hospital    Comment: Kindergarten  Social Needs  . Financial resource strain: Not hard at all  . Food insecurity:    Worry: Never true    Inability: Never  true  . Transportation needs:    Medical: No    Non-medical: No  Tobacco Use  . Smoking status: Never Smoker  . Smokeless tobacco: Never Used  Substance and Sexual Activity  . Alcohol use: Not Currently    Comment:      . Drug use: No  . Sexual activity: Not Currently  Lifestyle  . Physical activity:    Days per week: 0 days    Minutes per session: 0 min  . Stress: Rather much  Relationships  . Social connections:    Talks on phone: More than three times a week    Gets together: More than three times a week    Attends religious service: Not on file    Active member of club or organization: Not on file    Attends meetings of clubs or organizations: Not on file    Relationship status: Married  Other Topics Concern  . Not on file  Social History Narrative   Lives   Caffeine use:    Married      Allergies    Allergen Reactions  . Nitroglycerin Anaphylaxis    Hypotensive after NTG administration in ER during chest pain evaluation Pt states she almost died,  Pt states in a procedure done in 10/2016 she had no reaction to this medication, so not sure if it is an actual allergy.   . Pentosan Polysulfate Sodium     Elevated LFTs.......Marland Kitchen elmiron   . Sulfamethoxazole-Trimethoprim Nausea Only    Unknown reaction per pt, Pt thinks she may have felt sick to her stomach.   . Bactrim [Sulfamethoxazole-Trimethoprim]   . Promethazine Hcl     "jittery on the inside"  . Lisinopril Cough   Family History  Problem Relation Age of Onset  . Coronary artery disease Mother   . Hypertension Mother   . Asthma Mother   . Osteoporosis Mother   . Dementia Mother   . Stroke Mother        mini cva  . Diabetes Father   . Cirrhosis Father        non alcoholic  . Cancer Sister        colon  . Colon cancer Sister 78  . Cancer Paternal Aunt        2 aunts had renal cancer  . Coronary artery disease Maternal Grandfather   . Cancer Maternal Aunt        breast  . Diabetes Paternal Aunt   . Diabetes Paternal Grandmother   . Esophageal cancer Neg Hx   . Stomach cancer Neg Hx      Past medical history, social, surgical and family history all reviewed in electronic medical record.  No pertanent information unless stated regarding to the chief complaint.   Review of Systems:Review of systems updated and as accurate as of 02/13/18  No headache, visual changes, nausea, vomiting, diarrhea, constipation, dizziness, abdominal pain, skin rash, fevers, chills, night sweats, weight loss, swollen lymph nodes,, chest pain, shortness of breath, mood changes.  Positive muscle aches, body aches  Objective  Blood pressure 140/82, pulse 71, height 5' 2"  (1.575 m), weight 161 lb (73 kg), SpO2 98 %. Systems examined below as of 02/13/18   General: No apparent distress alert and oriented x3 mood and affect normal, dressed  appropriately.  HEENT: Pupils equal, extraocular movements intact  Respiratory: Patient's speak in full sentences and does not appear short of breath  Cardiovascular: No lower extremity edema, non tender, no erythema  Skin: Warm dry intact with no signs of infection or rash on extremities or on axial skeleton.  Abdomen: Soft nontender  Neuro: Cranial nerves II through XII are intact, neurovascularly intact in all extremities with  and 2+ pulses.  Lymph: No lymphadenopathy of posterior or anterior cervical chain or axillae bilaterally.  Gait antalgic MSK:  Non tender with full range of motion and good stability and symmetric strength and tone of shoulders, elbows, wrist, hip, knee and ankles bilaterally.  Arthritic changes of multiple joints  Left shoulder exam shows muscle atrophy noted.  Does have crepitus with range of motion.  4 out of 5 strength compared to the contralateral side.  Positive drop arm and empty can sign. Contralateral shoulder show some mild impingement and diffuse tenderness  Back exam social patient has a positive straight leg test on the left side.  4 out of 5 strength is symmetric to the contralateral side of the lower extremities.  Deep tendon reflexes does have 1+ of the Achilles compared to the contralateral side.  4 out of 5 strength of the hip girdle on the left compared to the right.  Procedure: Real-time Ultrasound Guided Injection of left glenohumeral joint Device: GE Logiq E  Ultrasound guided injection is preferred based studies that show increased duration, increased effect, greater accuracy, decreased procedural pain, increased response rate with ultrasound guided versus blind injection.  Verbal informed consent obtained.  Time-out conducted.  Noted no overlying erythema, induration, or other signs of local infection.  Skin prepped in a sterile fashion.  Local anesthesia: Topical Ethyl chloride.  With sterile technique and under real time ultrasound  guidance:  Joint visualized.  21g 2 inch needle inserted posterior approach. Pictures taken for needle placement. Patient did have injection of 2 cc of 0.5% Marcaine, and 1cc of Kenalog 40 mg/dL. Completed without difficulty  Pain immediately resolved suggesting accurate placement of the medication.  Advised to call if fevers/chills, erythema, induration, drainage, or persistent bleeding.  Images permanently stored and available for review in the ultrasound unit.  Impression: Technically successful ultrasound guided injection.   Impression and Recommendations:     This case required medical decision making of moderate complexity.      Note: This dictation was prepared with Dragon dictation along with smaller phrase technology. Any transcriptional errors that result from this process are unintentional.

## 2018-02-13 NOTE — Assessment & Plan Note (Signed)
Injected again today.  Patient should be having a replacement at some point in the near future.  We discussed icing regimen and home exercise.  Patient has been putting off surgery due to her being the main caregiver for her ailing husband.  Follow-up again with me in 4 weeks

## 2018-02-13 NOTE — Assessment & Plan Note (Signed)
Mild worsening symptoms at this point.  We discussed the possibility of an MRI which patient declined.  Home exercises given today.  Discussed icing regimen.  We discussed that if worsening symptoms an MRI and epidurals would be necessary.  Patient wants to hold on that at this moment.  Declined formal physical therapy.  Following up again in 4 to 6 weeks

## 2018-02-13 NOTE — Assessment & Plan Note (Signed)
Discussed with patient again at great length.  We discussed with patient that I do feel that the patient needs to take more time for her.  Patient has had difficulty with her having significant fatigue secondary to continuing to have pain.

## 2018-02-13 NOTE — Patient Instructions (Signed)
Good to see you  Lets watch the back but MRI would eb next step and injections Injected the shoulder  Tylenol 3 times a day no matter what  See me again in 6 weeks otherwise

## 2018-02-26 ENCOUNTER — Ambulatory Visit (HOSPITAL_BASED_OUTPATIENT_CLINIC_OR_DEPARTMENT_OTHER): Admit: 2018-02-26 | Payer: Medicare Other | Admitting: Orthopedic Surgery

## 2018-02-26 ENCOUNTER — Encounter (HOSPITAL_BASED_OUTPATIENT_CLINIC_OR_DEPARTMENT_OTHER): Payer: Self-pay

## 2018-02-26 SURGERY — CARPAL TUNNEL RELEASE
Anesthesia: Regional | Laterality: Right

## 2018-03-04 ENCOUNTER — Encounter: Payer: Self-pay | Admitting: Internal Medicine

## 2018-03-04 ENCOUNTER — Ambulatory Visit (INDEPENDENT_AMBULATORY_CARE_PROVIDER_SITE_OTHER): Payer: Medicare Other | Admitting: Internal Medicine

## 2018-03-04 VITALS — BP 132/86 | HR 76 | Temp 98.2°F | Resp 16 | Wt 161.0 lb

## 2018-03-04 DIAGNOSIS — B351 Tinea unguium: Secondary | ICD-10-CM

## 2018-03-04 DIAGNOSIS — S90421A Blister (nonthermal), right great toe, initial encounter: Secondary | ICD-10-CM | POA: Diagnosis not present

## 2018-03-04 DIAGNOSIS — I2510 Atherosclerotic heart disease of native coronary artery without angina pectoris: Secondary | ICD-10-CM

## 2018-03-04 MED ORDER — GLUCOSE BLOOD VI STRP: ORAL_STRIP | 3 refills | Status: DC

## 2018-03-04 MED ORDER — CICLOPIROX 8 % EX SOLN: Freq: Every day | CUTANEOUS | 5 refills | Status: DC

## 2018-03-04 MED ORDER — FREESTYLE LANCETS MISC: 3 refills | Status: DC

## 2018-03-04 MED FILL — FREESTYLE LANCETS: 90 days supply | Qty: 100 | Fill #0

## 2018-03-04 MED FILL — CICLOPIROX 8% SOLUTION: 8 | 30 days supply | Qty: 7 | Fill #0

## 2018-03-04 MED FILL — FREESTYLE LITE TEST STRIP: 50 days supply | Qty: 50 | Fill #0

## 2018-03-04 NOTE — Assessment & Plan Note (Signed)
Discussed options for treatment Will try topical - ciclopirox

## 2018-03-04 NOTE — Progress Notes (Signed)
Subjective:    Patient ID: [name], female    DOB: 12/31/1945, 72 y.o.   MRN: [id number]  HPI The patient is here for an acute visit.  Right large toe blister on medial aspect of toe:  It was fluid filled and intermittently drains on her own.  The fluid has a small amount of blood in.  She denies trauma or wearing flip flops.  She put betadine on it and neosporin on it.  She denies fevers.  It is slightly red around it.     Medications and allergies reviewed with patient and updated if appropriate.  Patient Active Problem List   Diagnosis Date Noted  . Hypercalcemia 09/17/2017  . Left wrist pain 09/17/2017  . Synovitis of left knee 05/22/2017  . Diabetes ([place]) 05/02/2017  . Trigger point of left shoulder region 03/27/2017  . Taste disorder 02/22/2017  . Anxiety 02/12/2017  . Caregiver with fatigue 01/17/2017  . Psychosocial stressors 01/17/2017  . Fatigue 01/15/2017  . Decreased sense of taste 01/15/2017  . Metallic taste 02/63/7858  . At high risk for falls 11/16/2016  . Family history of osteoporosis 11/16/2016  . Atherosclerotic plaque 11/07/2016  . 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 Renal insufficiency 06/29/2016  . Osteoporosis 06/21/2016  . Dizziness 05/16/2016  . Arthritis of left shoulder region 01/17/2016  . Ankylosing spondylitis ([place]) 11/08/2015  . Incontinence 10/28/2015  . Thoracic back pain 10/19/2015  . Leg cramping 09/29/2015  . 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 Impingement syndrome of left shoulder region 09/22/2014  . Interstitial cystitis 07/09/2014  . Diverticulosis of colon without hemorrhage 07/08/2014  . Carpal tunnel syndrome of left wrist 04/12/2014  . Food allergy 03/06/2014  . Renal calculi 01/30/2014  . OSA (obstructive sleep apnea), moderate 01/14/2014  . Seasonal and perennial allergic rhinitis 12/06/2013  . Seasonal allergic conjunctivitis 12/06/2013  . History of  Bell's palsy 02/26/2012  . Vitamin D deficiency 06/27/2010  . 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  . DRY MOUTH 06/27/2010  . ABDOMINAL PAIN, SUPRAPUBIC 05/25/2009  . Gastroesophageal reflux disease 04/30/2009  . DIVERTICULOSIS, COLON 04/30/2009  . History of colonic polyps 04/30/2009  . COSTOCHONDRITIS 01/15/2008  . HYPERLIPIDEMIA 08/21/2007  . Essential hypertension 08/21/2007  . ARTHRALGIA 08/21/2007  . Chronic lumbar radiculopathy 01/25/2007    Current Outpatient Medications on File Prior to Visit  Medication Sig Dispense Refill  . acetaminophen (TYLENOL) 500 MG tablet Take 500 mg by mouth every 4 (four) hours as needed for mild pain.     . Ascorbic Acid (VITAMIN C) 1000 MG tablet Take 1,000 mg by mouth daily.     Marland Kitchen aspirin 81 MG tablet Take 81 mg by mouth daily.      Marland Kitchen BIOTIN PO Take 1 tablet by mouth daily.    . blood glucose meter kit and supplies KIT Dispense based on patient and insurance preference. Use up to four times daily as directed. prediabetes 1 each 0  . Calcium Carbonate-Vit D-Min (CALCIUM 1200 PO) Take by mouth.    . cetirizine (ZYRTEC) 10 MG tablet Take 10 mg by mouth daily.      . Diclofenac Sodium 2 % SOLN Place onto the skin as directed for pain as needed.    . diphenoxylate-atropine (LOMOTIL) 2.5-0.025 MG tablet Take 1 tablet by mouth 4 (four) times daily as needed for diarrhea or loose stools. 30 tablet 0  . gabapentin (NEURONTIN) 100 MG capsule Take 1  capsule (100 mg total) by mouth at bedtime. 30 capsule 3  . hyoscyamine (LEVSIN SL) 0.125 MG SL tablet Place 1 tablet (0.125 mg total) under the tongue every 4 (four) hours as needed. 15 tablet 1  . lipase/protease/amylase (CREON) 36000 UNITS CPEP capsule Take 2 capsules (72,000 Units total) by mouth 3 (three) times daily with meals. 1 capsule with snacks (max 8 capsules daily) 240 capsule 3  . meloxicam (MOBIC) 15 MG tablet Take 1 tablet (15 mg total) by mouth daily as needed for pain. With food 90 tablet 2  . Omega-3  Fatty Acids (FISH OIL BURP-LESS) 1000 MG CAPS Takes 1000 mg BID 60 capsule 0  . Polyethyl Glycol-Propyl Glycol (SYSTANE OP) Apply 1 drop to eye as needed. Apply to both eyes as needed    . rosuvastatin (CRESTOR) 5 MG tablet Take 1 tablet (5 mg total) by mouth daily. 90 tablet 2  . vitamin B-12 (CYANOCOBALAMIN) 1000 MCG tablet Take 1,000 mcg by mouth daily.     . vitamin E 400 UNIT capsule Take 400 Units by mouth daily.     Current Facility-Administered Medications on File Prior to Visit  Medication Dose Route Frequency Provider Last Rate Last Dose  . 0.9 %  sodium chloride infusion  500 mL Intravenous Once Pyrtle, Lajuan Lines, MD        Past Medical History:  Diagnosis Date  . Allergy    seasonal  . Ankylosing spondylitis ([place])   . Anxiety   . Arthritis   . Bell palsy   . CTS (carpal tunnel syndrome)   . Cystitis   . Diverticulitis   . Diverticulosis of colon   . Dry eye syndrome   . Frozen shoulder   . GERD (gastroesophageal reflux disease)   . HLA B27 (HLA B27 positive)   . Hx of colonic polyps   . Hyperlipidemia    NMR 2005  . Hypertension   . Hypothyroidism   . Incontinence   . Internal hemorrhoids   . Interstitial cystitis   . Kidney stone   . Menopause   . Osteoarthritis   . Osteopenia    [facility]  . S/P total hysterectomy and bilateral salpingo-oophorectomy 04/12/2014  . Sleep apnea    c-pap  . Subjective visual disturbance of both eyes 12/30/2013  . Tubular adenoma of colon   . Vitamin D deficiency   . Xerostomia    and Xeroophthalmia    Past Surgical History:  Procedure Laterality Date  . ABDOMINAL HYSTERECTOMY    . BLADDER SURGERY     for incontinence  . CARPAL TUNNEL RELEASE Left 12/22/2014   Procedure: LEFT CARPAL TUNNEL RELEASE;  Surgeon: Daryll Brod, MD;  Location: [place];  Service: Orthopedics;  Laterality: Left;  . COLONOSCOPY W/ POLYPECTOMY     adenomatous poyp 04-2008  . colonscopy     tics 10-2002  .  CYSTOSCOPY WITH RETROGRADE PYELOGRAM, URETEROSCOPY AND STENT PLACEMENT Left 09/01/2013   Procedure: CYSTOSCOPY WITH RETROGRADE PYELOGRAM, AND LEFT STENT PLACEMENT;  Surgeon: Molli Hazard, MD;  Location: [facility];  Service: Urology;  Laterality: Left;  . elevated LFTs     due to [place]  . FINGER SURGERY Left 2004   2nd finger  . G 1 P 1    . interstitial cystitis     with clot in catheter  . SHOULDER ARTHROSCOPY Left   . TOTAL ABDOMINAL HYSTERECTOMY W/ BILATERAL SALPINGOOPHORECTOMY     dysfunctional menses  Social History   Socioeconomic History  . Marital status: Married    Spouse name: Not on file  . Number of children: 1  . Years of education: BS  . Highest education level: Not on file  Occupational History  . Occupation: retired  . Occupation: Pharmacist, hospital    Comment: Kindergarten  Social Needs  . Financial resource strain: Not hard at all  . Food insecurity:    Worry: Never true    Inability: Never true  . Transportation needs:    Medical: No    Non-medical: No  Tobacco Use  . Smoking status: Never Smoker  . Smokeless tobacco: Never Used  Substance and Sexual Activity  . Alcohol use: Not Currently    Comment:      . Drug use: No  . Sexual activity: Not Currently  Lifestyle  . Physical activity:    Days per week: 0 days    Minutes per session: 0 min  . Stress: Rather much  Relationships  . Social connections:    Talks on phone: More than three times a week    Gets together: More than three times a week    Attends religious service: Not on file    Active member of club or organization: Not on file    Attends meetings of clubs or organizations: Not on file    Relationship status: Married  Other Topics Concern  . Not on file  Social History Narrative   Lives   Caffeine use:    Married       Family History  Problem Relation Age of Onset  . Coronary artery disease Mother   . Hypertension Mother   . Asthma Mother   . Osteoporosis Mother   .  Dementia Mother   . Stroke Mother        mini cva  . Diabetes Father   . Cirrhosis Father        non alcoholic  . Cancer Sister        colon  . Colon cancer Sister 74  . Cancer Paternal Aunt        2 aunts had renal cancer  . Coronary artery disease Maternal Grandfather   . Cancer Maternal Aunt        breast  . Diabetes Paternal Aunt   . Diabetes Paternal Grandmother   . Esophageal cancer Neg Hx   . Stomach cancer Neg Hx     Review of Systems  Constitutional: Negative for chills and fever.  Cardiovascular: Negative for leg swelling.  Skin: Positive for color change and wound. Negative for rash.  Neurological: Negative for numbness.       Objective:   Vitals:   03/04/18 1308  BP: 132/86  Pulse: 76  Resp: 16  Temp: 98.2 F (36.8 C)  SpO2: 96%   BP Readings from Last 3 Encounters:  03/04/18 132/86  02/13/18 140/82  01/23/18 (!) 144/78   Wt Readings from Last 3 Encounters:  03/04/18 161 lb (73 kg)  02/13/18 161 lb (73 kg)  01/23/18 159 lb (72.1 kg)   Body mass index is 29.45 kg/m.   Physical Exam  Constitutional: She appears well-developed and well-nourished. No distress.  HENT:  Head: Normocephalic and atraumatic.  Skin: She is not diaphoretic. There is erythema (midl erythema at base of first right toe near blister).  Between right 1-2nd toes - almond sized blister that is deflated - minimal fluid inside, no active drainage, no pus, mildly tender, no surrounding swelling,  moisture and two skin breaks between 3-4th toes w/o discharge - non tender.  Onychomycosis of all toes nail with thickening and yellow discoloration           Assessment & Plan:    See Problem List for Assessment and Plan of chronic medical problems.

## 2018-03-04 NOTE — Assessment & Plan Note (Signed)
No current infection - she will monitor closely Likely related to moisture Keep area dry No soaking needed She will call if there is any evidence of infection

## 2018-03-04 NOTE — Patient Instructions (Signed)
Start the topical medication on your toenails.    Keep between your toes dry and monitor for infection.  If you see any evidence of infection please call.

## 2018-03-05 ENCOUNTER — Ambulatory Visit: Payer: Self-pay | Admitting: Family Medicine

## 2018-03-12 ENCOUNTER — Other Ambulatory Visit: Payer: Self-pay | Admitting: *Deleted

## 2018-03-12 MED ORDER — PANCRELIPASE (LIP-PROT-AMYL) 36000-114000 UNITS PO CPEP: 72000.0000 [IU] | ORAL_CAPSULE | Freq: Three times a day (TID) | ORAL | 0 refills | Status: DC

## 2018-03-14 DIAGNOSIS — M19012 Primary osteoarthritis, left shoulder: Secondary | ICD-10-CM | POA: Diagnosis not present

## 2018-03-14 DIAGNOSIS — M810 Age-related osteoporosis without current pathological fracture: Secondary | ICD-10-CM | POA: Diagnosis not present

## 2018-03-14 DIAGNOSIS — G8929 Other chronic pain: Secondary | ICD-10-CM | POA: Diagnosis not present

## 2018-03-14 DIAGNOSIS — M545 Low back pain: Secondary | ICD-10-CM | POA: Diagnosis not present

## 2018-03-15 ENCOUNTER — Encounter: Payer: Self-pay | Admitting: Internal Medicine

## 2018-03-20 ENCOUNTER — Ambulatory Visit (INDEPENDENT_AMBULATORY_CARE_PROVIDER_SITE_OTHER): Payer: Medicare Other | Admitting: Internal Medicine

## 2018-03-20 ENCOUNTER — Telehealth: Payer: Self-pay | Admitting: Pharmacist

## 2018-03-20 ENCOUNTER — Encounter: Payer: Self-pay | Admitting: Internal Medicine

## 2018-03-20 VITALS — BP 178/100 | HR 68 | Ht 62.0 in | Wt 164.0 lb

## 2018-03-20 DIAGNOSIS — E559 Vitamin D deficiency, unspecified: Secondary | ICD-10-CM

## 2018-03-20 DIAGNOSIS — M810 Age-related osteoporosis without current pathological fracture: Secondary | ICD-10-CM

## 2018-03-20 DIAGNOSIS — I2510 Atherosclerotic heart disease of native coronary artery without angina pectoris: Secondary | ICD-10-CM | POA: Diagnosis not present

## 2018-03-20 DIAGNOSIS — R7989 Other specified abnormal findings of blood chemistry: Secondary | ICD-10-CM | POA: Diagnosis not present

## 2018-03-20 LAB — BASIC METABOLIC PANEL WITH GFR
BUN/Creatinine Ratio: 13 (calc) (ref 6–22)
BUN: 14 mg/dL (ref 7–25)
CALCIUM: 10 mg/dL (ref 8.6–10.4)
CO2: 26 mmol/L (ref 20–32)
Chloride: 105 mmol/L (ref 98–110)
Creat: 1.09 mg/dL — ABNORMAL HIGH (ref 0.60–0.93)
GFR, Est African American: 58 mL/min/{1.73_m2} — ABNORMAL LOW (ref >=60)
GFR, Est Non African American: 50 mL/min/{1.73_m2} — ABNORMAL LOW (ref >=60)
GLUCOSE: 104 mg/dL — AB (ref 65–99)
POTASSIUM: 4.1 mmol/L (ref 3.5–5.3)
SODIUM: 142 mmol/L (ref 135–146)

## 2018-03-20 LAB — T4, FREE: Free T4: 0.66 ng/dL (ref 0.60–1.60)

## 2018-03-20 LAB — T3, FREE: T3, Free: 3.5 pg/mL (ref 2.3–4.2)

## 2018-03-20 LAB — TSH: TSH: 4.32 u[IU]/mL (ref 0.35–4.50)

## 2018-03-20 LAB — VITAMIN D 25 HYDROXY (VIT D DEFICIENCY, FRACTURES): VITD: 52.94 ng/mL (ref 30.00–100.00)

## 2018-03-20 NOTE — Telephone Encounter (Signed)
Pt called because BP was significantly elevated at other doctor appt. She states pressures at home are generally controlled at home (120s/70s), but did have one or two measurements recently that were elevated to 361Q systolic (mixed in with normal measurements). Looking at the pressure from her visit, it is markedly elevated at 178/100. She denies any symptoms associated with this pressure, such as headache, dizziness, blurred vision, or chest pain. She states she feels really well. She does state that she could have been nervous for her visit.   Since she is not having symptoms, advised to monitor pressures at home and call next week with report. Also advised that if she develops symptoms she should report to ED to be evaluated.

## 2018-03-20 NOTE — Progress Notes (Signed)
Patient ID: [name], female   DOB: [birth year], 72 y.o.   MRN: [id number]    HPI  [name] is a 72 y.o.-year-old female, initially referred by her Dr. Hilarie Fredrickson, returning for follow-up for osteoporosis, Vitamin D deficiency, hypercalcemia.  Last visit 6 months ago.  She was just taken off Metoprolol recently by cardiology 2/2 hypotension + dizziness. BP high today.  She was also recently started on Creon by Dr. Hilarie Fredrickson for fecal incontinence.  Reviewed and addended history: Pt was dx with OP in 2017.  I reviewed previous DEXA scan reports.  In 2018, T-scores are better, but still low at the 33% distal radius.   L1-L4 T score FN T score 33% distal Radius Ultra distal left radius   10/16/2016 (H[facility])  -0.5 RFN: -1.6 LFN: -1.9 -3.2 -1.9   Previously: Date L1-L4 T score FN T score 33% distal Radius Ultra distal left radius   06/20/2016 (L[facility])  n/a RFN: -1.8 LFN: -2.2 -3.9  -3.6   04/28/2014 ([facility])  L1, L3, L4: -0.8 (+5.7%*)  RFN: n/a LFN: -1.1 n/a n/a  08/03/2011 ([facility])  L1, L3, L4: -1.3 (+1.9%)  RFN: n/a LFN: -1.5 n/a n/a   She has a history of avulsion fracture of the left ankle several years ago.  She denies dizziness, vertigo/orthostasis/poor vision.  No history of falls  Previously osteoporotic treatments: - Boniva ~ for 3 years - "years ago" - Forteo-started 11/21/2016 >> stopped 11/2016 due to history of hypercalcemia >> she felt much better after she stopped - Prolia was suggested at last visit, but she was afraid of side effects and did not have this started  She has a distant history of vitamin D deficiency.    More recently, her levels have been normal: 11/16/2016: vit D 67 Lab Results  Component Value Date   VD25OH 77.84 06/03/2015   VD25OH 44 07/11/2011   VD25OH 30 04/30/2009   She was previously on HCTZ, which was stopped last year.  She was on ergocalciferol 50,000 units weekly, now on OTC vit D daily, but she cannot remember the dose.  She  will look at home and let me know.  No weightbearing exercises.  Not on excessive vitamin A doses.  She has few steroid tapers and 2 injections in left shoulder in the past  Menopause was at 72 years old.   Pt does have a FH of osteoporosis: sister.  Hypercalcemia: Pt had 2x slightly elevated calcium in 2018, without a suppressed PTH.  She has a history of kidney stones x1 in 2015 for which she had to have a stent placed.  Reviewed pertinent labs: Lab Results  Component Value Date   PTH 36 09/24/2017   CALCIUM 9.9 02/04/2018   CALCIUM 10.3 01/21/2018   CALCIUM 10.1 01/10/2018   CALCIUM 9.7 11/01/2017   CALCIUM 9.5 09/24/2017   CALCIUM 10.0 08/06/2017   CALCIUM 9.8 05/20/2017   CALCIUM 10.0 05/02/2017   CALCIUM 10.4 01/15/2017   CALCIUM 9.8 12/07/2016  11/16/2016: PTH 43, calcium 10.6 (8.5-10.5) 08/07/2016: PTH 64.3 (12-72), calcium 10.3 (8.5-10.2)  Of note, she had a 24-hour urine calcium that was normal, at 156.2 performed by OB/GYN.  No history of thyrotoxicosis. 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  Last BUN/Cr: Lab Results  Component Value Date   BUN 17 02/04/2018   CREATININE 1.04 (H) 02/04/2018   Continues on biotin 2500 mcg daily.  ROS: Constitutional: no weight gain/no weight loss, + fatigue, + subjective hyperthermia, no subjective hypothermia, + nocturia Eyes: + Blurry vision, no xerophthalmia ENT: no sore throat, no nodules palpated in throat, no dysphagia, no odynophagia, no hoarseness Cardiovascular: no CP/no SOB/no palpitations/+ leg swelling Respiratory: no cough/no SOB/no wheezing Gastrointestinal: + N/no V/no D/no C/no acid reflux Musculoskeletal: no muscle aches/no joint aches Skin: no rashes, no hair loss Neurological: no tremors/no  numbness/no tingling/no dizziness  I reviewed pt's medications, allergies, PMH, social hx, family hx, and changes were documented in the history of present illness. Otherwise, unchanged from my initial visit note.  Past Medical History:  Diagnosis Date  . Allergy    seasonal  . Ankylosing spondylitis ([place])   . Anxiety   . Arthritis   . Bell palsy   . CTS (carpal tunnel syndrome)   . Cystitis   . Diverticulitis   . Diverticulosis of colon   . Dry eye syndrome   . Frozen shoulder   . GERD (gastroesophageal reflux disease)   . HLA B27 (HLA B27 positive)   . Hx of colonic polyps   . Hyperlipidemia    NMR 2005  . Hypertension   . Hypothyroidism   . Incontinence   . Internal hemorrhoids   . Interstitial cystitis   . Kidney stone   . Menopause   . Osteoarthritis   . Osteopenia    [facility]  . S/P total hysterectomy and bilateral salpingo-oophorectomy 04/12/2014  . Sleep apnea    c-pap  . Subjective visual disturbance of both eyes 12/30/2013  . Tubular adenoma of colon   . Vitamin D deficiency   . Xerostomia    and Xeroophthalmia   Past Surgical History:  Procedure Laterality Date  . ABDOMINAL HYSTERECTOMY    . BLADDER SURGERY     for incontinence  . CARPAL TUNNEL RELEASE Left 12/22/2014   Procedure: LEFT CARPAL TUNNEL RELEASE;  Surgeon: Daryll Brod, MD;  Location: [place];  Service: Orthopedics;  Laterality: Left;  . COLONOSCOPY W/ POLYPECTOMY     adenomatous poyp 04-2008  . colonscopy     tics 10-2002  . CYSTOSCOPY WITH RETROGRADE PYELOGRAM, URETEROSCOPY AND STENT PLACEMENT Left 09/01/2013   Procedure: CYSTOSCOPY WITH RETROGRADE PYELOGRAM, AND LEFT STENT PLACEMENT;  Surgeon: Molli Hazard, MD;  Location: [facility];  Service: Urology;  Laterality: Left;  . elevated LFTs     due to Elmiron  . FINGER SURGERY Left 2004   2nd finger  . G 1 P 1    . interstitial cystitis     with clot in catheter  . SHOULDER ARTHROSCOPY Left    . TOTAL ABDOMINAL HYSTERECTOMY W/ BILATERAL SALPINGOOPHORECTOMY     dysfunctional menses   Social History   Social History  . Marital status: Married    Spouse name: N/A  . Number of children: 1  . Years of education: BS   Occupational History  . 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 teacher     Kindergarten   Social History Main Topics  . Smoking status: Never Smoker  . Smokeless tobacco: Never Used  . Alcohol use Yes     Comment: occ  .  Drug use: No  . Sexual activity: Not Currently   Other Topics Concern  . Not on file   Social History Narrative   Married         Current Outpatient Medications on File Prior to Visit  Medication Sig Dispense Refill  . acetaminophen (TYLENOL) 500 MG tablet Take 500 mg by mouth every 4 (four) hours as needed for mild pain.     . Ascorbic Acid (VITAMIN C) 1000 MG tablet Take 1,000 mg by mouth daily.     Marland Kitchen aspirin 81 MG tablet Take 81 mg by mouth daily.      Marland Kitchen BIOTIN PO Take 1 tablet by mouth daily.    . blood glucose meter kit and supplies KIT Dispense based on patient and insurance preference. Use up to four times daily as directed. prediabetes 1 each 0  . Calcium Carbonate-Vit D-Min (CALCIUM 1200 PO) Take by mouth.    . cetirizine (ZYRTEC) 10 MG tablet Take 10 mg by mouth daily.      . ciclopirox (PENLAC) 8 % solution Apply topically at bedtime. Apply over nail & surrounding skin. Apply QD over previous coat. After seven days, remove w/ alcohol, continue 6.6 mL 5  . Diclofenac Sodium 2 % SOLN Place onto the skin as directed for pain as needed.    . gabapentin (NEURONTIN) 100 MG capsule Take 1 capsule (100 mg total) by mouth at bedtime. 30 capsule 3  . glucose blood (FREESTYLE LITE) test strip Use to check blood sugar daily 100 each 3  . Lancets (FREESTYLE) lancets Use to check blood sugar daily 100 each 3  . lipase/protease/amylase (CREON) 36000  UNITS CPEP capsule Take 2 capsules (72,000 Units total) by mouth 3 (three) times daily with meals. 1 capsule with snacks (max 8 capsules daily) 240 capsule 0  . meloxicam (MOBIC) 15 MG tablet Take 1 tablet (15 mg total) by mouth daily as needed for pain. With food 90 tablet 2  . Omega-3 Fatty Acids (FISH OIL BURP-LESS) 1000 MG CAPS Takes 1000 mg BID 60 capsule 0  . Polyethyl Glycol-Propyl Glycol (SYSTANE OP) Apply 1 drop to eye as needed. Apply to both eyes as needed    . rosuvastatin (CRESTOR) 5 MG tablet Take 1 tablet (5 mg total) by mouth daily. 90 tablet 2  . vitamin B-12 (CYANOCOBALAMIN) 1000 MCG tablet Take 1,000 mcg by mouth daily.     . vitamin E 400 UNIT capsule Take 400 Units by mouth daily.     No current facility-administered medications on file prior to visit.    Allergies  Allergen Reactions  . Nitroglycerin Anaphylaxis    Hypotensive after NTG administration in ER during chest pain evaluation Pt states she almost died,  Pt states in a procedure done in 10/2016 she had no reaction to this medication, so not sure if it is an actual allergy.   . Pentosan Polysulfate Sodium     Elevated LFTs.......Marland Kitchen elmiron   . Sulfamethoxazole-Trimethoprim Nausea Only    Unknown reaction per pt, Pt thinks she may have felt sick to her stomach.   . Bactrim [Sulfamethoxazole-Trimethoprim]   . Promethazine Hcl     "jittery on the inside"  . Lisinopril Cough   Family History  Problem Relation Age of Onset  . Coronary artery disease Mother   . Hypertension Mother   . Asthma Mother   . Osteoporosis Mother   . Dementia Mother   . Stroke Mother        mini  cva  . Diabetes Father   . Cirrhosis Father        non alcoholic  . Cancer Sister        colon  . Colon cancer Sister 24  . Cancer Paternal Aunt        2 aunts had renal cancer  . Coronary artery disease Maternal Grandfather   . Cancer Maternal Aunt        breast  . Diabetes Paternal Aunt   . Diabetes Paternal Grandmother   .  Esophageal cancer Neg Hx   . Stomach cancer Neg Hx     PE: BP (!) 178/100 Comment: Checked 3 times  Pulse 68   Ht 5' 2"  (1.575 m)   Wt 164 lb (74.4 kg)   SpO2 98%   BMI 30.00 kg/m  Wt Readings from Last 3 Encounters:  03/20/18 164 lb (74.4 kg)  03/04/18 161 lb (73 kg)  02/13/18 161 lb (73 kg)   Constitutional: overweight, in NAD Eyes: PERRLA, EOMI, no exophthalmos ENT: moist mucous membranes, no thyromegaly, no cervical lymphadenopathy Cardiovascular: RRR, No MRG Respiratory: CTA B Gastrointestinal: abdomen soft, NT, ND, BS+ Musculoskeletal: no deformities, strength intact in all 4 Skin: moist, warm, no rashes Neurological: no tremor with outstretched hands, DTR normal in all 4  Assessment: 1. Osteoporosis  2. Hypercalcemia  3. Vit D def  Plan: 1. Osteoporosis -Most likely postmenopausal, but she also has a family history of osteoporosis and has been on steroid injections and prednisone courses.  Primary hyperparathyroidism investigation was negative at last visit.  Urine calcium was also negative as previously checked by OB/GYN.  At last visit, we stopped her calcium supplements but I did advise her to try to take at least 1000 mg daily from her diet. -She was previously on Boniva for 3 years, which she tolerated well, but had to take acid reflux medications.  Also, she was briefly on teriparatide but did not tolerate this well due to dizziness, headache, constipation.  We stopped this to investigate her hypercalcemia, and her symptoms improved.  At last visit, I suggested Prolia, which she agreed with.  We did discuss about possible side effects including atypical fractures and ONJ.  After the visit, she actually started to have doubts about it and ended up refusing it.  She is now not on any osteoporotic medicines.  We again discussed at length about mechanisms of action, benefits, and side effects of the medicines.  She is still not convinced that she would not have a side  effect from Prolia, but appears more open to the possibility. -She will have another DEXA scan this year ordered by her OB/GYN doctor.  I advised her to have this sent to me. -I will see her back in 6 months  2. Hypercalcemia -Patient had 2 instances of elevated calcium level before starting teriparatide, in the presence of nonsuppressed PTH.  We stopped Forteo since hypercalcemia/hyperparathyroidism is a contraindication to this.  At last visit, she had investigation for primary hyperparathyroidism but her PTH and calcium levels were normal.  Of note, she does have a history of kidney stones and her 33% distal radius bone density was the lowest, both possible features of hyperparathyroidism.  However, since her investigation was normal, we decided to continue to follow her clinically and biochemically and refer her to surgery if the investigation becomes positive for primary hyperparathyroidism.  It is possible, though, that her hypercalcemia was contributed to by her calcium supplements (which we stopped at last  visit) and also her previous use of HCTZ. -We will recheck a PTH and calcium today  3. H/o Vitamin D deficiency -Most recent vitamin D level normal -Previously on ergocalciferol 50,000 units weekly, currently on over-the-counter vitamin D supplement, but she cannot remember the dose.  She will look at home and let me know. -We will recheck her level today  Office Visit on 03/20/2018  Component Date Value Ref Range Status  . VITD 03/20/2018 52.94  30.00 - 100.00 ng/mL Final  . Glucose, Bld 03/20/2018 104* 65 - 99 mg/dL Final   Comment: .            Fasting reference interval . For someone without known diabetes, a glucose value between 100 and 125 mg/dL is consistent with prediabetes and should be confirmed with a follow-up test. .   . BUN 03/20/2018 14  7 - 25 mg/dL Final  . Creat 03/20/2018 1.09* 0.60 - 0.93 mg/dL Final   Comment: For patients >1 years of age, the reference  limit for Creatinine is approximately 13% higher for people identified as African-American. .   . GFR, Est Non African American 03/20/2018 50* > OR = 60 mL/min/1.67m Final  . GFR, Est African American 03/20/2018 58* > OR = 60 mL/min/1.772mFinal  . BUN/Creatinine Ratio 03/20/2018 13  6 - 22 (calc) Final  . Sodium 03/20/2018 142  135 - 146 mmol/L Final  . Potassium 03/20/2018 4.1  3.5 - 5.3 mmol/L Final  . Chloride 03/20/2018 105  98 - 110 mmol/L Final  . CO2 03/20/2018 26  20 - 32 mmol/L Final  . Calcium 03/20/2018 10.0  8.6 - 10.4 mg/dL Final  . PTH 03/20/2018 42  15 - 65 pg/mL Final  . TSH 03/20/2018 4.32  0.35 - 4.50 uIU/mL Final  . Free T4 03/20/2018 0.66  0.60 - 1.60 ng/dL Final   Comment: Specimens from patients who are undergoing biotin therapy and /or ingesting biotin supplements may contain high levels of biotin.  The higher biotin concentration in these specimens interferes with this Free T4 assay.  Specimens that contain high levels  of biotin may cause false high results for this Free T4 assay.  Please interpret results in light of the total clinical presentation of the patient.    . T3, Free 03/20/2018 3.5  2.3 - 4.2 pg/mL Final   Thyroid tests are normal now.   Vitamin D, calcium and PTH levels are all normal. Kidney function is stable.  CrPhilemon KingdomMD PhD [facility]

## 2018-03-20 NOTE — Patient Instructions (Addendum)
Please stay off calcium supplements for now.  Please stop at the lab.

## 2018-03-21 LAB — PARATHYROID HORMONE, INTACT (NO CA): PTH: 42 pg/mL (ref 15–65)

## 2018-03-22 ENCOUNTER — Telehealth: Payer: Self-pay | Admitting: Cardiology

## 2018-03-22 MED ORDER — HYDROCHLOROTHIAZIDE 12.5 MG PO CAPS: 12.5000 mg | ORAL_CAPSULE | Freq: Every day | ORAL | 1 refills | Status: DC

## 2018-03-22 MED FILL — HYDROCHLOROTHIAZIDE 12.5 MG: 12.5 | 90 days supply | Qty: 90 | Fill #0

## 2018-03-22 NOTE — Telephone Encounter (Signed)
Pt is calling to let Dr Meda Coffee and Tana Coast PharmD in BP clinic know that her BP has consistently been running high, since she last talked to [place] about this issue on 8/21. Pt states that Georgina Peer wanted her to log her BP x 1 week then call this into the office for further recommendations, but being the weekend is coming up, she didn't want to remain hypertensive and have nothing to address this issue.  Pt states that her BP's have been running in the 130s-140s range, with no diastolic values provided. Pt states she also reached 282 systolic since talking to [place]. Pt states at her Endocrinology appt the other week, her BP was left arm-178/100 and right arm-180/100.  Pt states she is under a lot of stress with her husbands health issues, so that could be causing her numbers to be elevated.  Pt states she gets mildly dizzy at times, but no other cardiac complaints were voiced.  Pt states she was on BP meds a while back, but they were discontinued by Dr Meda Coffee.  Pt would like for Dr Meda Coffee and Georgina Peer to advise on what she should do about her readings?  Should she go back on a low dose BP med? Informed the pt that I will route this message to both Dr Meda Coffee and Tana Coast PharmD for further review and recommendation.  Informed the pt that I will follow-up shortly thereafter.  Pt verbalized understanding and agrees with this plan.

## 2018-03-22 NOTE — Telephone Encounter (Signed)
Spoke with the pt and informed her that per Dr Meda Coffee and [facility] Pharmacist, they recommend that she should start taking HCTZ 12.5 mg po daily.  Advised the pt to take this in the AM.  Advised the pt to continue monitoring her BP, and report concerning values, as needed.  Confirmed the pharmacy of choice with the pt. Pt verbalized understanding and agrees with this plan.

## 2018-03-22 NOTE — Telephone Encounter (Signed)
[name] is off today, covering her inbox - pt previously took HCTZ 25mg  daily however she became lightheaded and medication was stopped. Home readings only mildly elevated. Could try resuming lower dose of HCTZ 12.5mg  daily and advise pt to monitor BP. Ok to cut her 25mg  dose in half if she has the tablet formulation still left at home. Would need f/u BMET 2 weeks after starting diuretic.

## 2018-03-22 NOTE — Telephone Encounter (Signed)
New Message:    Patient calling about some concerns she having about some medication and just would like some  Advice.

## 2018-03-22 NOTE — Addendum Note (Signed)
Addended by: Nuala Alpha on: 03/22/2018 02:47 PM   Modules accepted: Orders

## 2018-03-22 NOTE — Telephone Encounter (Signed)
Thank you [name] Blossom! [name] can you call her with recommendations for HCTZ 12.5 mg po daily? Thank you, [place] Siren

## 2018-03-26 NOTE — Progress Notes (Signed)
[name] [place] [address] [place], [place] [address] Phone: [phone] Subjective:      I [name] am serving as a Education administrator for Dr. Hulan Saas.  CC: Left shoulder pain follow-up  [id number]  [name] is a 72 y.o. female coming in with complaint of left shoulder pain. States that her shoulder is not doing well. Wants an injection.  Patient was having worsening pain again.  Was just given injection 6 weeks ago.  Patient states that it is waking her up at night again.  Denies numbness or tingling.  Rates the severity of pain as 7 out of 10.  Does not want joint replacement.      Past Medical History:  Diagnosis Date  . Allergy    seasonal  . Ankylosing spondylitis ([place])   . Anxiety   . Arthritis   . Bell palsy   . CTS (carpal tunnel syndrome)   . Cystitis   . Diverticulitis   . Diverticulosis of colon   . Dry eye syndrome   . Frozen shoulder   . GERD (gastroesophageal reflux disease)   . HLA B27 (HLA B27 positive)   . Hx of colonic polyps   . Hyperlipidemia    NMR 2005  . Hypertension   . Hypothyroidism   . Incontinence   . Internal hemorrhoids   . Interstitial cystitis   . Kidney stone   . Menopause   . Osteoarthritis   . Osteopenia    [facility]  . S/P total hysterectomy and bilateral salpingo-oophorectomy 04/12/2014  . Sleep apnea    c-pap  . Subjective visual disturbance of both eyes 12/30/2013  . Tubular adenoma of colon   . Vitamin D deficiency   . Xerostomia    and Xeroophthalmia   Past Surgical History:  Procedure Laterality Date  . ABDOMINAL HYSTERECTOMY    . BLADDER SURGERY     for incontinence  . CARPAL TUNNEL RELEASE Left 12/22/2014   Procedure: LEFT CARPAL TUNNEL RELEASE;  Surgeon: Daryll Brod, MD;  Location: [place];  Service: Orthopedics;  Laterality: Left;  . COLONOSCOPY W/ POLYPECTOMY     adenomatous poyp 04-2008  . colonscopy     tics 10-2002  .  CYSTOSCOPY WITH RETROGRADE PYELOGRAM, URETEROSCOPY AND STENT PLACEMENT Left 09/01/2013   Procedure: CYSTOSCOPY WITH RETROGRADE PYELOGRAM, AND LEFT STENT PLACEMENT;  Surgeon: Molli Hazard, MD;  Location: [facility];  Service: Urology;  Laterality: Left;  . elevated LFTs     due to Elmiron  . FINGER SURGERY Left 2004   2nd finger  . G 1 P 1    . interstitial cystitis     with clot in catheter  . SHOULDER ARTHROSCOPY Left   . TOTAL ABDOMINAL HYSTERECTOMY W/ BILATERAL SALPINGOOPHORECTOMY     dysfunctional menses   Social History   Socioeconomic History  . Marital status: Married    Spouse name: Not on file  . Number of children: 1  . Years of education: BS  . Highest education level: Not on file  Occupational History  . Occupation: retired  . Occupation: Pharmacist, hospital    Comment: Kindergarten  Social Needs  . Financial resource strain: Not hard at all  . Food insecurity:    Worry: Never true    Inability: Never true  . Transportation needs:    Medical: No    Non-medical: No  Tobacco Use  . Smoking status: Never Smoker  .  Smokeless tobacco: Never Used  Substance and Sexual Activity  . Alcohol use: Not Currently    Comment:      . Drug use: No  . Sexual activity: Not Currently  Lifestyle  . Physical activity:    Days per week: 0 days    Minutes per session: 0 min  . Stress: Rather much  Relationships  . Social connections:    Talks on phone: More than three times a week    Gets together: More than three times a week    Attends religious service: Not on file    Active member of club or organization: Not on file    Attends meetings of clubs or organizations: Not on file    Relationship status: Married  Other Topics Concern  . Not on file  Social History Narrative   Lives   Caffeine use:    Married      Allergies  Allergen Reactions  . Nitroglycerin Anaphylaxis    Hypotensive after NTG administration in ER during chest pain evaluation Pt states she almost died,    Pt states in a procedure done in 10/2016 she had no reaction to this medication, so not sure if it is an actual allergy.   . Pentosan Polysulfate Sodium     Elevated LFTs.......Marland Kitchen elmiron   . Sulfamethoxazole-Trimethoprim Nausea Only    Unknown reaction per pt, Pt thinks she may have felt sick to her stomach.   . Bactrim [Sulfamethoxazole-Trimethoprim]   . Promethazine Hcl     "jittery on the inside"  . Lisinopril Cough   Family History  Problem Relation Age of Onset  . Coronary artery disease Mother   . Hypertension Mother   . Asthma Mother   . Osteoporosis Mother   . Dementia Mother   . Stroke Mother        mini cva  . Diabetes Father   . Cirrhosis Father        non alcoholic  . Cancer Sister        colon  . Colon cancer Sister 27  . Cancer Paternal Aunt        2 aunts had renal cancer  . Coronary artery disease Maternal Grandfather   . Cancer Maternal Aunt        breast  . Diabetes Paternal Aunt   . Diabetes Paternal Grandmother   . Esophageal cancer Neg Hx   . Stomach cancer Neg Hx      Current Outpatient Medications (Cardiovascular):  .  hydrochlorothiazide (MICROZIDE) 12.5 MG capsule, Take 1 capsule (12.5 mg total) by mouth daily. .  rosuvastatin (CRESTOR) 5 MG tablet, Take 1 tablet (5 mg total) by mouth daily.  Current Outpatient Medications (Respiratory):  .  cetirizine (ZYRTEC) 10 MG tablet, Take 10 mg by mouth daily.    Current Outpatient Medications (Analgesics):  .  acetaminophen (TYLENOL) 500 MG tablet, Take 500 mg by mouth every 4 (four) hours as needed for mild pain.  Marland Kitchen  aspirin 81 MG tablet, Take 81 mg by mouth daily.   .  meloxicam (MOBIC) 15 MG tablet, Take 1 tablet (15 mg total) by mouth daily as needed for pain. With food  Current Outpatient Medications (Hematological):  .  vitamin B-12 (CYANOCOBALAMIN) 1000 MCG tablet, Take 1,000 mcg by mouth daily.   Current Outpatient Medications (Other):  Marland Kitchen  Ascorbic Acid (VITAMIN C) 1000 MG tablet, Take  1,000 mg by mouth daily.  Marland Kitchen  BIOTIN PO, Take 1 tablet by mouth daily. Marland Kitchen  blood glucose meter kit and supplies KIT, Dispense based on patient and insurance preference. Use up to four times daily as directed. prediabetes .  Calcium Carbonate-Vit D-Min (CALCIUM 1200 PO), Take by mouth. .  ciclopirox (PENLAC) 8 % solution, Apply topically at bedtime. Apply over nail & surrounding skin. Apply QD over previous coat. After seven days, remove w/ alcohol, continue .  Diclofenac Sodium 2 % SOLN, Place onto the skin as directed for pain as needed. .  gabapentin (NEURONTIN) 100 MG capsule, Take 1 capsule (100 mg total) by mouth at bedtime. Marland Kitchen  glucose blood (FREESTYLE LITE) test strip, Use to check blood sugar daily .  Lancets (FREESTYLE) lancets, Use to check blood sugar daily .  lipase/protease/amylase (CREON) 36000 UNITS CPEP capsule, Take 2 capsules (72,000 Units total) by mouth 3 (three) times daily with meals. 1 capsule with snacks (max 8 capsules daily) .  Omega-3 Fatty Acids (FISH OIL BURP-LESS) 1000 MG CAPS, Takes 1000 mg BID .  Polyethyl Glycol-Propyl Glycol (SYSTANE OP), Apply 1 drop to eye as needed. Apply to both eyes as needed .  vitamin E 400 UNIT capsule, Take 400 Units by mouth daily.    Past medical history, social, surgical and family history all reviewed in electronic medical record.  No pertanent information unless stated regarding to the chief complaint.   Review of Systems:  No headache, visual changes, nausea, vomiting, diarrhea, constipation, dizziness, abdominal pain, skin rash, fevers, chills, night sweats, weight loss, swollen lymph nodes, body aches, joint swelling,  chest pain, shortness of breath, mood changes.  Positive muscle aches  Objective  Blood pressure 130/80, pulse 69, height 5' 2" (1.575 m), weight 162 lb (73.5 kg), SpO2 98 %.    General: No apparent distress alert and oriented x3 mood and affect normal, dressed appropriately.  HEENT: Pupils equal, extraocular  movements intact  Respiratory: Patient's speak in full sentences and does not appear short of breath  Cardiovascular: No lower extremity edema, non tender, no erythema  Skin: Warm dry intact with no signs of infection or rash on extremities or on axial skeleton.  Abdomen: Soft nontender  Neuro: Cranial nerves II through XII are intact, neurovascularly intact in all extremities with 2+ DTRs and 2+ pulses.  Lymph: No lymphadenopathy of posterior or anterior cervical chain or axillae bilaterally.  Gait antalgic MSK:  tender with full range of motion and good stability and symmetric strength and tone of  elbows, wrist, hip, knee and ankles bilaterally.  Left shoulder does have decreasing range of motion.  Crepitus noted.  Rotator cuff strength 4 out of 5.  Neurovascular intact distally    Impression and Recommendations:     This case required medical decision making of moderate complexity. The above documentation has been reviewed and is accurate and complete Lyndal Pulley, DO       Note: This dictation was prepared with Dragon dictation along with smaller phrase technology. Any transcriptional errors that result from this process are unintentional.

## 2018-03-27 ENCOUNTER — Encounter: Payer: Self-pay | Admitting: Family Medicine

## 2018-03-27 ENCOUNTER — Ambulatory Visit (INDEPENDENT_AMBULATORY_CARE_PROVIDER_SITE_OTHER): Payer: Medicare Other | Admitting: Family Medicine

## 2018-03-27 DIAGNOSIS — M19012 Primary osteoarthritis, left shoulder: Secondary | ICD-10-CM

## 2018-03-27 DIAGNOSIS — I2510 Atherosclerotic heart disease of native coronary artery without angina pectoris: Secondary | ICD-10-CM | POA: Diagnosis not present

## 2018-03-27 NOTE — Assessment & Plan Note (Signed)
Continues to have difficulty.  Is given injection 6 weeks ago.  Would like to wait another 4 weeks.  We discussed other vitamin supplementation that could be beneficial.  Discussed icing regimen and home exercise.  Discussed which activities to do which wants to avoid.  Follow-up again in 4 weeks

## 2018-03-27 NOTE — Patient Instructions (Addendum)
Good to see you  Sorry for being late Ice is your friend Iron 65mg  with 500mg  of vitamin C daily if you can tolerate it.  See me again in 4 weeks and we can inject shoulder

## 2018-03-28 MED FILL — ROSUVASTATIN CALCIUM 5 MG T: 5 | 90 days supply | Qty: 90 | Fill #0

## 2018-04-04 ENCOUNTER — Other Ambulatory Visit: Payer: Self-pay | Admitting: Nurse Practitioner

## 2018-04-04 DIAGNOSIS — S99912A Unspecified injury of left ankle, initial encounter: Secondary | ICD-10-CM

## 2018-04-04 DIAGNOSIS — M25472 Effusion, left ankle: Secondary | ICD-10-CM

## 2018-04-04 DIAGNOSIS — M25572 Pain in left ankle and joints of left foot: Secondary | ICD-10-CM

## 2018-04-04 MED FILL — MELOXICAM 15 MG TABLET: 15 | 90 days supply | Qty: 90 | Fill #0

## 2018-04-04 NOTE — Telephone Encounter (Signed)
Please advise if you are ok filling this medication. This was not last prescribed by you.

## 2018-04-08 ENCOUNTER — Ambulatory Visit (INDEPENDENT_AMBULATORY_CARE_PROVIDER_SITE_OTHER): Payer: Medicare Other | Admitting: Internal Medicine

## 2018-04-08 ENCOUNTER — Other Ambulatory Visit: Payer: Self-pay

## 2018-04-08 ENCOUNTER — Encounter: Payer: Self-pay | Admitting: Internal Medicine

## 2018-04-08 ENCOUNTER — Other Ambulatory Visit (INDEPENDENT_AMBULATORY_CARE_PROVIDER_SITE_OTHER): Payer: Medicare Other

## 2018-04-08 ENCOUNTER — Encounter

## 2018-04-08 VITALS — BP 152/88 | HR 80 | Ht 62.0 in | Wt 164.8 lb

## 2018-04-08 DIAGNOSIS — K580 Irritable bowel syndrome with diarrhea: Secondary | ICD-10-CM

## 2018-04-08 DIAGNOSIS — K219 Gastro-esophageal reflux disease without esophagitis: Secondary | ICD-10-CM | POA: Diagnosis not present

## 2018-04-08 DIAGNOSIS — K8681 Exocrine pancreatic insufficiency: Secondary | ICD-10-CM

## 2018-04-08 DIAGNOSIS — I2510 Atherosclerotic heart disease of native coronary artery without angina pectoris: Secondary | ICD-10-CM

## 2018-04-08 DIAGNOSIS — K529 Noninfective gastroenteritis and colitis, unspecified: Secondary | ICD-10-CM | POA: Diagnosis not present

## 2018-04-08 LAB — CBC WITH DIFFERENTIAL/PLATELET
BASOS PCT: 0.9 % (ref 0.0–3.0)
Basophils Absolute: 0.1 10*3/uL (ref 0.0–0.1)
EOS PCT: 5 % (ref 0.0–5.0)
Eosinophils Absolute: 0.3 10*3/uL (ref 0.0–0.7)
HCT: 38.3 % (ref 36.0–46.0)
Hemoglobin: 12.9 g/dL (ref 12.0–15.0)
LYMPHS ABS: 1.8 10*3/uL (ref 0.7–4.0)
Lymphocytes Relative: 28.7 % (ref 12.0–46.0)
MCHC: 33.6 g/dL (ref 30.0–36.0)
MCV: 88.5 fl (ref 78.0–100.0)
MONO ABS: 0.6 10*3/uL (ref 0.1–1.0)
Monocytes Relative: 9.1 % (ref 3.0–12.0)
NEUTROS ABS: 3.5 10*3/uL (ref 1.4–7.7)
NEUTROS PCT: 56.3 % (ref 43.0–77.0)
Platelets: 248 10*3/uL (ref 150.0–400.0)
RBC: 4.34 Mil/uL (ref 3.87–5.11)
RDW: 13.4 % (ref 11.5–15.5)
WBC: 6.2 10*3/uL (ref 4.0–10.5)

## 2018-04-08 LAB — IBC PANEL
IRON: 66 ug/dL (ref 42–145)
Saturation Ratios: 16.6 % — ABNORMAL LOW (ref 20.0–50.0)
Transferrin: 284 mg/dL (ref 212.0–360.0)

## 2018-04-08 LAB — FERRITIN: Ferritin: 61 ng/mL (ref 10.0–291.0)

## 2018-04-08 MED ORDER — RANITIDINE HCL 150 MG PO TABS: 150.0000 mg | ORAL_TABLET | Freq: Two times a day (BID) | ORAL | 3 refills | Status: DC

## 2018-04-08 MED ORDER — DIPHENOXYLATE-ATROPINE 2.5-0.025 MG PO TABS: 1.0000 | ORAL_TABLET | Freq: Four times a day (QID) | ORAL | 2 refills | Status: DC | PRN

## 2018-04-08 MED ORDER — PANCRELIPASE (LIP-PROT-AMYL) 40000-126000 UNITS PO CPEP: 2.0000 | ORAL_CAPSULE | Freq: Three times a day (TID) | ORAL | 0 refills | Status: DC

## 2018-04-08 MED ORDER — PREDNISONE 50 MG PO TABS: 50.0000 mg | ORAL_TABLET | Freq: Every day | ORAL | 0 refills | Status: DC

## 2018-04-08 MED FILL — predniSONE 50 MG TABS: 50 | 5 days supply | Qty: 5 | Fill #0

## 2018-04-08 MED FILL — DIPHENOXYLATE-ATROPINE 2.5-: 2.5-0.025 | 15 days supply | Qty: 90 | Fill #0

## 2018-04-08 MED FILL — raNITIdine HCL 150 MG TABS: 150 | 30 days supply | Qty: 60 | Fill #0

## 2018-04-08 NOTE — Progress Notes (Signed)
Subjective:    Patient ID: [name], female    DOB: [birth year], 72 y.o.   MRN: [id number]  HPI [name] is a 72 year old female with a history of IBS, exocrine pancreatic insufficiency, colonic diverticulosis with history of diverticulitis, history of adenomatous polyps, GERD who is here for follow-up.  She is here today with her daughter.  She was last seen in the office on 01/10/2018 and for colonoscopy on 01/23/2018.  Colonoscopy was uneventful and occurred on 01/23/2018.  This revealed a normal terminal ileum.  2 polyps greatest 3 mm, removed from the hepatic flexure.  These were tubular adenoma.  There was diverticulosis in the sigmoid.  Internal hemorrhoids were found during retroflexion.  Random biopsies were normal.  She has been taking Creon 2 capsules with meals and one with snacks.  She tried Zenpep initially but insurance preferred Creon.  She wonders if the Zenpep was not slightly more effective.  Her loose stools are definitively better than before pancreatic enzyme replacement.  She states that most stools are soft but formed.  They can be hard.  She does have to assist with bowel movement by pushing on the perineum at times.  She averages 1-2 bowel movements a day but again can skip a day.  When she skips a day she will have abdominal bloating which is visibly noticeable to her.  She will still have days where she will have a loose urgent stool despite the pancreatic enzyme replacement.  She is kept a journal log of these episodes and counted 5 such episodes since 02/28/2018, thus about once per week.  No definite dietary trigger to such episodes.  She is feeling some globus and indigestion type symptoms.  She has been off of PPI therapy.  No dysphagia or odynophagia.  She still states that she has multiple different symptoms not sure which are related or interrelated.  She reports intermittent abdominal cramping, leg cramping worsening after stopping magnesium.  She has at times  swelling or tightness in her hands, face and eyelids.  She is noticed increase in blood pressure and is working with cardiology to adjust blood pressure medication.  She is now on hydrochlorothiazide 12.5 mg daily.  She has multiple joint pains, fatigue despite CPAP, some nausea without vomiting, some sweating and chills.  Appetite has remained good and weight is up about 3 pounds.  She also has 3-4 episodes of nocturia nightly.  She has seen rheumatology in the past both Dr. Trudie Reed and Dr. Amil Amen locally, but most recently at [place] [facility]ology recommended physical therapy which should begin soon.  She reports she was told to take oral iron by Dr. Tamala Julian because she has noticed some changes in her finger nails with ridges.  She has not yet started this.  Review of Systems As per HPI, otherwise negative  Current Medications, Allergies, Past Medical History, Past Surgical History, Family History and Social History were reviewed in [employer] record.     Objective:   Physical Exam BP (!) 152/88   Pulse 80   Ht 5' 2"  (1.575 m)   Wt 164 lb 12.8 oz (74.8 kg)   BMI 30.14 kg/m  Constitutional: Well-developed and well-nourished. No distress. HEENT: Normocephalic and atraumatic.  No scleral icterus. Neurological: Alert and oriented to person place and time. Skin: Skin is warm and dry. Psychiatric: Normal mood and affect. Behavior is normal.  CBC    Component Value Date/Time   WBC 6.2 04/08/2018  0934   RBC 4.34 04/08/2018 0934   HGB 12.9 04/08/2018 0934   HCT 38.3 04/08/2018 0934   PLT 248.0 04/08/2018 0934   MCV 88.5 04/08/2018 0934   MCH 29.7 05/20/2017 2243   MCHC 33.6 04/08/2018 0934   RDW 13.4 04/08/2018 0934   LYMPHSABS 1.8 04/08/2018 0934   MONOABS 0.6 04/08/2018 0934   EOSABS 0.3 04/08/2018 0934   BASOSABS 0.1 04/08/2018 0934   CMP     Component Value Date/Time   NA 142 03/20/2018 0900   NA 145 (H) 02/04/2018 1410   K 4.1  03/20/2018 0900   CL 105 03/20/2018 0900   CO2 26 03/20/2018 0900   GLUCOSE 104 (H) 03/20/2018 0900   GLUCOSE 99 07/23/2006 1101   BUN 14 03/20/2018 0900   BUN 17 02/04/2018 1410   CREATININE 1.09 (H) 03/20/2018 0900   CALCIUM 10.0 03/20/2018 0900   PROT 7.8 01/10/2018 0940   PROT 6.8 08/06/2017 0924   ALBUMIN 4.6 01/10/2018 0940   ALBUMIN 4.4 08/06/2017 0924   AST 22 01/10/2018 0940   ALT 21 01/10/2018 0940   ALKPHOS 91 01/10/2018 0940   BILITOT 0.5 01/10/2018 0940   BILITOT 0.5 08/06/2017 0924   GFRNONAA 50 (L) 03/20/2018 0900   GFRAA 58 (L) 03/20/2018 0900       Assessment & Plan:  72 year old female with a history of IBS, exocrine pancreatic insufficiency, colonic diverticulosis with history of diverticulitis, history of adenomatous polyps, GERD who is here for follow-up.  1.  EPI and IBS --her loose stools, diarrhea and urgency has improved dramatically with the addition of pancreatic enzyme replacement.  She wonders if Zenpep is at work better for her than Creon.  Samples given of Zenpep 40,000 units to try to with meals, one with snacks to see if this seems more effective for her.  If so we can switch back to Zenpep if her insurance will allow.  Time today discussing this diagnosis and symptoms.  About once per week she will have a loose urgent stool and I would like her to see if this correlates with any dietary triggers such as dairy products/foods or other food groups.  Will give Lomotil 1 capsule to use on an as-needed basis for such loose stool episodes.  2.  GERD/globus --resume acid suppression.  Ranitidine 150 mg twice daily  3.  Nailbed changes --we will check iron levels and blood count before starting iron supplementation.  Oral iron may affect her bowel function.  4.  Adenomatous colon polyps --recent colonoscopy repeat in 5 years  5.  Joint pains --working with rheumatology with plans for physical therapy.  She does have a history of HLA-B27 and possible  ankylosing spondylitis.  She will discuss with rheumatology whether or not immunosuppression/steroids would improve her overall symptoms.  7-monthfollow-up, she will call in late October to schedule December appointment.  45 minutes spent with the patient today. Greater than 50% was spent in counseling and coordination of care with the patient

## 2018-04-08 NOTE — Patient Instructions (Signed)
We have given you samples of the following medication to take: Zenpep-Take 2-3 capsules with each meal, 1 capsule with each snack. Let us know how this works for you.  We have sent the following medications to your pharmacy for you to pick up at your convenience: Rantidine 150 mg twice daily. Lomotil four times daily as needed.  Your provider has requested that you go to the basement level for lab work before leaving today. Press "B" on the elevator. The lab is located at the first door on the left as you exit the elevator.  Please follow up with Dr Hilarie Fredrickson in 3 months.  If you are age 74 or older, your body mass index should be between 23-30. Your Body mass index is 30.14 kg/m. If this is out of the aforementioned range listed, please consider follow up with your Primary Care Provider.  If you are age 62 or younger, your body mass index should be between 19-25. Your Body mass index is 30.14 kg/m. If this is out of the aformentioned range listed, please consider follow up with your Primary Care Provider.

## 2018-04-15 ENCOUNTER — Ambulatory Visit: Payer: Self-pay | Admitting: Internal Medicine

## 2018-04-15 ENCOUNTER — Ambulatory Visit (INDEPENDENT_AMBULATORY_CARE_PROVIDER_SITE_OTHER): Payer: Medicare Other | Admitting: Internal Medicine

## 2018-04-15 ENCOUNTER — Encounter: Payer: Self-pay | Admitting: Internal Medicine

## 2018-04-15 VITALS — BP 148/82 | HR 57 | Temp 98.3°F | Resp 16 | Ht 62.0 in | Wt 164.0 lb

## 2018-04-15 DIAGNOSIS — Z23 Encounter for immunization: Secondary | ICD-10-CM

## 2018-04-15 DIAGNOSIS — I2510 Atherosclerotic heart disease of native coronary artery without angina pectoris: Secondary | ICD-10-CM

## 2018-04-15 DIAGNOSIS — I10 Essential (primary) hypertension: Secondary | ICD-10-CM

## 2018-04-15 DIAGNOSIS — R42 Dizziness and giddiness: Secondary | ICD-10-CM

## 2018-04-15 NOTE — Assessment & Plan Note (Addendum)
Having dizziness/off balanced feeling - likely BPPV Will refer to ENT - may benefit from PT Will hold off on medication unless symptoms worse - then can try meclizine Call if symptoms worsen Restart BP medication If dizziness persists and not vertigo related - may need to see cardio

## 2018-04-15 NOTE — Assessment & Plan Note (Signed)
?   Controlled Monitor BP daily and keep log Continue - restart hctz daily Will consider adding hydralazine given mild CRI and bradycardia

## 2018-04-15 NOTE — Telephone Encounter (Signed)
Daughter initially called for [name] but then put [name] (pt) on the phone at my request.  Pt's speech and mentation were clear and appropriate however she was c/o feeling dizzy that started on Saturday.   She thinks it may be her BP because they have been trying to get her BP under control.    She describes the dizziness as a cross between the room spinning and lightheadedness.   "When I walk it's hard to walk straight sometimes but I'm not having to hold onto things to walk".     She checked her BP as we were ending the call and said it was 171/98.  I scheduled her with Dr. Quay Burow for today at 9:30am.     Reason for Disposition . [1] MODERATE dizziness (e.g., vertigo; feels very unsteady, interferes with normal activities) AND [2] has NOT been evaluated by physician for this  Answer Assessment - Initial Assessment Questions 1. DESCRIPTION: "Describe your dizziness."     It started over the weekend.   When you walk it's hard to walk straight.    I've had this before.   They had changed my BP medication and gastro medications.    2. VERTIGO: "Do you feel like either you or the room is spinning or tilting?"      I feel like it's both. 3. LIGHTHEADED: "Do you feel lightheaded?" (e.g., somewhat faint, woozy, weak upon standing)     A little of both dizziness and lightheaded 4. SEVERITY: "How bad is it?"  "Can you walk?"   - MILD - Feels unsteady but walking normally.   - MODERATE - Feels very unsteady when walking, but not falling; interferes with normal activities (e.g., school, work) .   - SEVERE - Unable to walk without falling (requires assistance).     Something just doesn't feel right.   5. ONSET:  "When did the dizziness begin?"     Started Saturday and by late evening it was worse. 6. AGGRAVATING FACTORS: "Does anything make it worse?" (e.g., standing, change in head position)     Moving my body. 7. CAUSE: "What do you think is causing the dizziness?"     I don't know.   My gastro  medicine or maybe my BP medication.    8. RECURRENT SYMPTOM: "Have you had dizziness before?" If so, ask: "When was the last time?" "What happened that time?"     Yes see above 9. OTHER SYMPTOMS: "Do you have any other symptoms?" (e.g., headache, weakness, numbness, vomiting, earache)     Sometimes having trouble thinking.   I had a pink color around my neck.   An unusual sensation in my throat.    10. PREGNANCY: "Is there any chance you are pregnant?" "When was your last menstrual period?"       Not asked  Protocols used: DIZZINESS - VERTIGO-A-AH

## 2018-04-15 NOTE — Telephone Encounter (Signed)
FYI

## 2018-04-15 NOTE — Patient Instructions (Addendum)
   Flu immunization administered today.     Medications reviewed and updated.  Changes include -  none.   A referral was ordered for ENT.   Please followup in 1 month

## 2018-04-15 NOTE — Progress Notes (Signed)
Subjective:    Patient ID: [name], female    DOB: 1946-03-15, 72 y.o.   MRN: [id number]  HPI The patient is here for an acute visit.  Sat -  Her BP was 143/87.    Dizziness/lightheadedness: last week she was a little dizzy all week. It was mild and tolerable.  It was worse two days ago.  She was "dizzy" all day.  Today she is a little unsteady.  Top of head had a funny feeling - like there is movement on top of her head.  She has had a pain in her left chest - behind the breast.    Two days ago - BP was high in evening.  Felt awful - generally felt weak.  She rested and went to bed. Took tylenol.  She felt a strange flutter and sensitivity in her throat, which persisted (hard to breathe), pain back of neck up into head.     sunday - she still did not feel good. Slight pain in chest.  Trouble thinking of words. Used cpap. Head inside feels odd.   She was not sure if one of her medications was causing the symptoms so she has not taken them for the past two days.   9/16 - 171/98 - w/o medication    Wore cpap last night.  Does not wear it every single night.  She does not feel different if she wears it or not.    Chest pain on left side - she has a sensation there today - it is discomfort.    BP has been variable at home - she has some readings today - some controlled.  Taking hctz daily except for past two days.   Medications and allergies reviewed with patient and updated if appropriate.  Patient Active Problem List   Diagnosis Date Noted  . Blister of great toe of right foot 03/04/2018  . Onychomycosis of toenail 03/04/2018  . Hypercalcemia 09/17/2017  . Left wrist pain 09/17/2017  . Synovitis of left knee 05/22/2017  . Diabetes ([place]) 05/02/2017  . Trigger point of left shoulder region 03/27/2017  . Taste disorder 02/22/2017  . Anxiety 02/12/2017  . Caregiver with fatigue 01/17/2017  . Psychosocial stressors 01/17/2017  . Fatigue 01/15/2017  . Decreased sense of  taste 01/15/2017  . Metallic taste 78/29/5621  . At high risk for falls 11/16/2016  . Family history of osteoporosis 11/16/2016  . Atherosclerotic plaque 11/07/2016  . 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 Renal insufficiency 06/29/2016  . Osteoporosis 06/21/2016  . Dizziness 05/16/2016  . Arthritis of left shoulder region 01/17/2016  . Ankylosing spondylitis ([place]) 11/08/2015  . Incontinence 10/28/2015  . Thoracic back pain 10/19/2015  . Leg cramping 09/29/2015  . 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 Impingement syndrome of left shoulder region 09/22/2014  . Interstitial cystitis 07/09/2014  . Diverticulosis of colon without hemorrhage 07/08/2014  . Carpal tunnel syndrome of left wrist 04/12/2014  . Food allergy 03/06/2014  . Renal calculi 01/30/2014  . OSA (obstructive sleep apnea), moderate 01/14/2014  . Seasonal and perennial allergic rhinitis 12/06/2013  . Seasonal allergic conjunctivitis 12/06/2013  . History of Bell's palsy 02/26/2012  . Vitamin D deficiency 06/27/2010  . 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  . DRY MOUTH 06/27/2010  . ABDOMINAL PAIN, SUPRAPUBIC 05/25/2009  . Gastroesophageal reflux disease 04/30/2009  . DIVERTICULOSIS, COLON 04/30/2009  . History of colonic polyps 04/30/2009  . COSTOCHONDRITIS 01/15/2008  .  HYPERLIPIDEMIA 08/21/2007  . Essential hypertension 08/21/2007  . ARTHRALGIA 08/21/2007  . Chronic lumbar radiculopathy 01/25/2007    Current Outpatient Medications on File Prior to Visit  Medication Sig Dispense Refill  . acetaminophen (TYLENOL) 500 MG tablet Take 500 mg by mouth every 4 (four) hours as needed for mild pain.     . Ascorbic Acid (VITAMIN C) 1000 MG tablet Take 1,000 mg by mouth daily.     Marland Kitchen aspirin 81 MG tablet Take 81 mg by mouth daily.      Marland Kitchen BIOTIN PO Take 1 tablet by mouth daily.    . blood glucose meter kit and supplies KIT Dispense based on patient and insurance preference.  Use up to four times daily as directed. prediabetes 1 each 0  . cetirizine (ZYRTEC) 10 MG tablet Take 10 mg by mouth daily.      . ciclopirox (PENLAC) 8 % solution Apply topically at bedtime. Apply over nail & surrounding skin. Apply QD over previous coat. After seven days, remove w/ alcohol, continue 6.6 mL 5  . Diclofenac Sodium 2 % SOLN Place onto the skin as directed for pain as needed.    . diphenoxylate-atropine (LOMOTIL) 2.5-0.025 MG tablet Take 1 tablet by mouth 4 (four) times daily as needed for diarrhea or loose stools. 90 tablet 2  . gabapentin (NEURONTIN) 100 MG capsule Take 1 capsule (100 mg total) by mouth at bedtime. 30 capsule 3  . hydrochlorothiazide (MICROZIDE) 12.5 MG capsule Take 1 capsule (12.5 mg total) by mouth daily. 90 capsule 1  . Lancets (FREESTYLE) lancets Use to check blood sugar daily 100 each 3  . lipase/protease/amylase (CREON) 36000 UNITS CPEP capsule Take 2 capsules (72,000 Units total) by mouth 3 (three) times daily with meals. 1 capsule with snacks (max 8 capsules daily) 240 capsule 0  . meloxicam (MOBIC) 15 MG tablet TAKE 1 TABLET (15 MG TOTAL) BY MOUTH DAILY AS NEEDED FOR PAIN. WITH FOOD 90 tablet 2  . Omega-3 Fatty Acids (FISH OIL BURP-LESS) 1000 MG CAPS Takes 1000 mg BID 60 capsule 0  . Pancrelipase, Lip-Prot-Amyl, (ZENPEP) 40000-126000 units CPEP Take 2-3 capsules by mouth 3 (three) times daily with meals. 1 capsule with each snack 84 capsule 0  . Polyethyl Glycol-Propyl Glycol (SYSTANE OP) Apply 1 drop to eye as needed. Apply to both eyes as needed    . predniSONE (DELTASONE) 50 MG tablet Take 1 tablet (50 mg total) by mouth daily. 5 tablet 0  . ranitidine (ZANTAC) 150 MG tablet Take 1 tablet (150 mg total) by mouth 2 (two) times daily. 60 tablet 3  . vitamin B-12 (CYANOCOBALAMIN) 1000 MCG tablet Take 1,000 mcg by mouth daily.     . vitamin E 400 UNIT capsule Take 400 Units by mouth daily.    . rosuvastatin (CRESTOR) 5 MG tablet Take 1 tablet (5 mg total) by  mouth daily. 90 tablet 2   No current facility-administered medications on file prior to visit.     Past Medical History:  Diagnosis Date  . Allergy    seasonal  . Ankylosing spondylitis ([place])   . Anxiety   . Arthritis   . Bell palsy   . CTS (carpal tunnel syndrome)   . Cystitis   . Diverticulitis   . Diverticulosis of colon   . Dry eye syndrome   . Frozen shoulder   . GERD (gastroesophageal reflux disease)   . HLA B27 (HLA B27 positive)   . Hx of colonic polyps   .  Hyperlipidemia    NMR 2005  . Hypertension   . Hypothyroidism   . Incontinence   . Internal hemorrhoids   . Interstitial cystitis   . Kidney stone   . Menopause   . Osteoarthritis   . Osteopenia    [facility]  . S/P total hysterectomy and bilateral salpingo-oophorectomy 04/12/2014  . Sleep apnea    c-pap  . Subjective visual disturbance of both eyes 12/30/2013  . Tubular adenoma of colon   . Vitamin D deficiency   . Xerostomia    and Xeroophthalmia    Past Surgical History:  Procedure Laterality Date  . ABDOMINAL HYSTERECTOMY    . BLADDER SURGERY     for incontinence  . CARPAL TUNNEL RELEASE Left 12/22/2014   Procedure: LEFT CARPAL TUNNEL RELEASE;  Surgeon: Daryll Brod, MD;  Location: [place];  Service: Orthopedics;  Laterality: Left;  . COLONOSCOPY W/ POLYPECTOMY     adenomatous poyp 04-2008  . colonscopy     tics 10-2002  . CYSTOSCOPY WITH RETROGRADE PYELOGRAM, URETEROSCOPY AND STENT PLACEMENT Left 09/01/2013   Procedure: CYSTOSCOPY WITH RETROGRADE PYELOGRAM, AND LEFT STENT PLACEMENT;  Surgeon: Molli Hazard, MD;  Location: [facility];  Service: Urology;  Laterality: Left;  . elevated LFTs     due to [place]  . FINGER SURGERY Left 2004   2nd finger  . G 1 P 1    . interstitial cystitis     with clot in catheter  . SHOULDER ARTHROSCOPY Left   . TOTAL ABDOMINAL HYSTERECTOMY W/ BILATERAL SALPINGOOPHORECTOMY     dysfunctional menses    Social History     Socioeconomic History  . Marital status: Married    Spouse name: Not on file  . Number of children: 1  . Years of education: BS  . Highest education level: Not on file  Occupational History  . Occupation: retired  . Occupation: Pharmacist, hospital    Comment: Kindergarten  Social Needs  . Financial resource strain: Not hard at all  . Food insecurity:    Worry: Never true    Inability: Never true  . Transportation needs:    Medical: No    Non-medical: No  Tobacco Use  . Smoking status: Never Smoker  . Smokeless tobacco: Never Used  Substance and Sexual Activity  . Alcohol use: Not Currently    Comment:      . Drug use: No  . Sexual activity: Not Currently  Lifestyle  . Physical activity:    Days per week: 0 days    Minutes per session: 0 min  . Stress: Rather much  Relationships  . Social connections:    Talks on phone: More than three times a week    Gets together: More than three times a week    Attends religious service: Not on file    Active member of club or organization: Not on file    Attends meetings of clubs or organizations: Not on file    Relationship status: Married  Other Topics Concern  . Not on file  Social History Narrative   Lives   Caffeine use:    Married       Family History  Problem Relation Age of Onset  . Coronary artery disease Mother   . Hypertension Mother   . Asthma Mother   . Osteoporosis Mother   . Dementia Mother   . Stroke Mother        mini cva  .  Diabetes Father   . Cirrhosis Father        non alcoholic  . Cancer Sister        colon  . Colon cancer Sister 60  . Cancer Paternal Aunt        2 aunts had renal cancer  . Coronary artery disease Maternal Grandfather   . Cancer Maternal Aunt        breast  . Diabetes Paternal Aunt   . Diabetes Paternal Grandmother   . Esophageal cancer Neg Hx   . Stomach cancer Neg Hx     Review of Systems  Constitutional: Positive for chills. Negative for fever.  HENT: Negative for  congestion, ear pain, sinus pressure, sinus pain and sore throat.   Respiratory: Negative for cough, shortness of breath and wheezing.   Cardiovascular: Positive for chest pain. Negative for palpitations and leg swelling.  Gastrointestinal: Negative for abdominal pain and nausea.       Gerd controlled  Musculoskeletal: Positive for neck pain.  Neurological: Positive for dizziness, light-headedness and numbness (chronic in fingers).       Objective:   Vitals:   04/15/18 0924  BP: (!) 148/82  Pulse: (!) 57  Resp: 16  Temp: 98.3 F (36.8 C)  SpO2: 98%   BP Readings from Last 3 Encounters:  04/15/18 (!) 148/82  04/08/18 (!) 152/88  03/27/18 130/80   Wt Readings from Last 3 Encounters:  04/15/18 164 lb (74.4 kg)  04/08/18 164 lb 12.8 oz (74.8 kg)  03/27/18 162 lb (73.5 kg)   Body mass index is 30 kg/m.   Physical Exam  Constitutional: She is oriented to person, place, and time. She appears well-developed and well-nourished. No distress.  HENT:  Head: Normocephalic and atraumatic.  Right Ear: External ear normal.  Left Ear: External ear normal.  Mouth/Throat: Oropharynx is clear and moist.  B/l ear canals and TM normal  Eyes: Conjunctivae and EOM are normal.  Neck: Neck supple. No tracheal deviation present. No thyromegaly present.  Cardiovascular: Normal rate and regular rhythm.  No murmur heard. Pulmonary/Chest: Effort normal. No respiratory distress. She has no wheezes. She has no rales.  Musculoskeletal: She exhibits no edema.  Lymphadenopathy:    She has no cervical adenopathy.  Neurological: She is alert and oriented to person, place, and time. No cranial nerve deficit or sensory deficit.  Skin: Skin is warm and dry. She is not diaphoretic.           Assessment & Plan:    See Problem List for Assessment and Plan of chronic medical problems.

## 2018-04-16 ENCOUNTER — Telehealth: Payer: Self-pay

## 2018-04-16 DIAGNOSIS — Z01419 Encounter for gynecological examination (general) (routine) without abnormal findings: Secondary | ICD-10-CM | POA: Diagnosis not present

## 2018-04-16 DIAGNOSIS — J3089 Other allergic rhinitis: Principal | ICD-10-CM

## 2018-04-16 DIAGNOSIS — M816 Localized osteoporosis [Lequesne]: Secondary | ICD-10-CM | POA: Diagnosis not present

## 2018-04-16 DIAGNOSIS — K219 Gastro-esophageal reflux disease without esophagitis: Secondary | ICD-10-CM | POA: Diagnosis not present

## 2018-04-16 DIAGNOSIS — J029 Acute pharyngitis, unspecified: Secondary | ICD-10-CM | POA: Diagnosis not present

## 2018-04-16 DIAGNOSIS — J310 Chronic rhinitis: Secondary | ICD-10-CM | POA: Diagnosis not present

## 2018-04-16 DIAGNOSIS — J302 Other seasonal allergic rhinitis: Secondary | ICD-10-CM

## 2018-04-16 DIAGNOSIS — Z6829 Body mass index (BMI) 29.0-29.9, adult: Secondary | ICD-10-CM | POA: Diagnosis not present

## 2018-04-16 DIAGNOSIS — R42 Dizziness and giddiness: Secondary | ICD-10-CM

## 2018-04-16 DIAGNOSIS — N958 Other specified menopausal and perimenopausal disorders: Secondary | ICD-10-CM | POA: Diagnosis not present

## 2018-04-16 NOTE — Telephone Encounter (Signed)
Pt needs a referral to allergy and asthma. Seen by ENT today. Has an appointment already for Allergy and Asthma on Thursday just needs a referral.

## 2018-04-18 ENCOUNTER — Encounter: Payer: Self-pay | Admitting: Allergy and Immunology

## 2018-04-18 ENCOUNTER — Ambulatory Visit (INDEPENDENT_AMBULATORY_CARE_PROVIDER_SITE_OTHER): Payer: Medicare Other | Admitting: Allergy and Immunology

## 2018-04-18 VITALS — BP 110/68 | HR 62 | Temp 98.2°F | Resp 16 | Ht 62.28 in | Wt 162.6 lb

## 2018-04-18 DIAGNOSIS — H1013 Acute atopic conjunctivitis, bilateral: Secondary | ICD-10-CM | POA: Diagnosis not present

## 2018-04-18 DIAGNOSIS — T7840XD Allergy, unspecified, subsequent encounter: Secondary | ICD-10-CM

## 2018-04-18 DIAGNOSIS — I2510 Atherosclerotic heart disease of native coronary artery without angina pectoris: Secondary | ICD-10-CM | POA: Diagnosis not present

## 2018-04-18 DIAGNOSIS — J3089 Other allergic rhinitis: Secondary | ICD-10-CM | POA: Diagnosis not present

## 2018-04-18 DIAGNOSIS — Z91018 Allergy to other foods: Secondary | ICD-10-CM

## 2018-04-18 MED ORDER — OLOPATADINE HCL 0.2 % OP SOLN: 1.0000 [drp] | OPHTHALMIC | 5 refills | Status: DC

## 2018-04-18 MED ORDER — LEVOCETIRIZINE DIHYDROCHLORIDE 5 MG PO TABS: 5.0000 mg | ORAL_TABLET | Freq: Every evening | ORAL | 5 refills | Status: DC

## 2018-04-18 NOTE — Assessment & Plan Note (Signed)
Patient's history suggest the possibility of food allergy to peanut and tree nut.  The symptoms are somewhat atypical for IgE mediated food allergy, however she did have borderline reactivity to peanut, almond, and hazelnut.  Skin tests were negative to pork and beans despite a positive histamine control.  Laboratory order form has been provided for serum specific IgE against peanut and peanut components, tree nut panel with reflex components, pork, and bean panel.  For now, avoid any food which causes untoward symptoms.

## 2018-04-18 NOTE — Patient Instructions (Addendum)
Perennial and seasonal allergic rhinitis  Aeroallergen avoidance measures have been discussed and provided in written form.  A prescription has been provided for levocetirizine, 5 mg daily as needed.  To avoid diminishing benefit with daily use (tachyphylaxis) of second generation antihistamine, consider alternating every few months between fexofenadine (Allegra) and levocetirizine (Xyzal).  A prescription has been provided for fluticasone nasal spray, 2 sprays per nostril daily as needed. Proper nasal spray technique has been discussed and demonstrated.  Nasal saline spray (i.e., Simply Saline) or nasal saline lavage (i.e., NeilMed) is recommended as needed and prior to medicated nasal sprays.  Allergic conjunctivitis  Treatment plan as outlined above for allergic rhinitis.  A prescription has been provided for Pataday, one drop per eye daily as needed.  I have also recommended eye lubricant drops (i.e., Natural Tears) as needed.  History of food allergy Patient's history suggest the possibility of food allergy to peanut and tree nut.  The symptoms are somewhat atypical for IgE mediated food allergy, however she did have borderline reactivity to peanut, almond, and hazelnut.  Skin tests were negative to pork and beans despite a positive histamine control.  Laboratory order form has been provided for serum specific IgE against peanut and peanut components, tree nut panel with reflex components, pork, and bean panel.  For now, avoid any food which causes untoward symptoms.   Return in about 5 months (around 09/18/2018), or if symptoms worsen or fail to improve.   Control of Dust Mite Allergen  House dust mites play a major role in allergic asthma and rhinitis.  They occur in environments with high humidity wherever human skin, the food for dust mites is found. High levels have been detected in dust obtained from mattresses, pillows, carpets, upholstered furniture, bed covers, clothes  and soft toys.  The principal allergen of the house dust mite is found in its feces.  A gram of dust may contain 1,000 mites and 250,000 fecal particles.  Mite antigen is easily measured in the air during house cleaning activities.    1. Encase mattresses, including the box spring, and pillow, in an air tight cover.  Seal the zipper end of the encased mattresses with wide adhesive tape. 2. Wash the bedding in water of 130 degrees Farenheit weekly.  Avoid cotton comforters/quilts and flannel bedding: the most ideal bed covering is the dacron comforter. 3. Remove all upholstered furniture from the bedroom. 4. Remove carpets, carpet padding, rugs, and non-washable window drapes from the bedroom.  Wash drapes weekly or use plastic window coverings. 5. Remove all non-washable stuffed toys from the bedroom.  Wash stuffed toys weekly. 6. Have the room cleaned frequently with a vacuum cleaner and a damp dust-mop.  The patient should not be in a room which is being cleaned and should wait 1 hour after cleaning before going into the room. 7. Close and seal all heating outlets in the bedroom.  Otherwise, the room will become filled with dust-laden air.  An electric heater can be used to heat the room. Reduce indoor humidity to less than 50%.  Do not use a humidifier.   Reducing Pollen Exposure  The American Academy of Allergy, Asthma and Immunology suggests the following steps to reduce your exposure to pollen during allergy seasons.    1. Do not hang sheets or clothing out to dry; pollen may collect on these items. 2. Do not mow lawns or spend time around freshly cut grass; mowing stirs up pollen. 3. Keep windows closed at  night.  Keep car windows closed while driving. 4. Minimize morning activities outdoors, a time when pollen counts are usually at their highest. 5. Stay indoors as much as possible when pollen counts or humidity is high and on windy days when pollen tends to remain in the air  longer. 6. Use air conditioning when possible.  Many air conditioners have filters that trap the pollen spores. 7. Use a HEPA room air filter to remove pollen form the indoor air you breathe.   Control of Dog or Cat Allergen  Avoidance is the best way to manage a dog or cat allergy. If you have a dog or cat and are allergic to dog or cats, consider removing the dog or cat from the home. If you have a dog or cat but don't want to find it a new home, or if your family wants a pet even though someone in the household is allergic, here are some strategies that may help keep symptoms at bay:  1. Keep the pet out of your bedroom and restrict it to only a few rooms. Be advised that keeping the dog or cat in only one room will not limit the allergens to that room. 2. Don't pet, hug or kiss the dog or cat; if you do, wash your hands with soap and water. 3. High-efficiency particulate air (HEPA) cleaners run continuously in a bedroom or living room can reduce allergen levels over time. 4. Place electrostatic material sheet in the air inlet vent in the bedroom. 5. Regular use of a high-efficiency vacuum cleaner or a central vacuum can reduce allergen levels. 6. Giving your dog or cat a bath at least once a week can reduce airborne allergen.   Control of Mold Allergen  Mold and fungi can grow on a variety of surfaces provided certain temperature and moisture conditions exist.  Outdoor molds grow on plants, decaying vegetation and soil.  The major outdoor mold, Alternaria and Cladosporium, are found in very high numbers during hot and dry conditions.  Generally, a late Summer - Fall peak is seen for common outdoor fungal spores.  Rain will temporarily lower outdoor mold spore count, but counts rise rapidly when the rainy period ends.  The most important indoor molds are Aspergillus and Penicillium.  Dark, humid and poorly ventilated basements are ideal sites for mold growth.  The next most common sites of  mold growth are the bathroom and the kitchen.  Outdoor [employer] 1. Use air conditioning and keep windows closed 2. Avoid exposure to decaying vegetation. 3. Avoid leaf raking. 4. Avoid grain handling. 5. Consider wearing a face mask if working in moldy areas.  Indoor Mold Control 1. Maintain humidity below 50%. 2. Clean washable surfaces with 5% bleach solution. 3. Remove sources e.g. Contaminated carpets.

## 2018-04-18 NOTE — Progress Notes (Signed)
New Patient Note  RE: [name] MRN: [id number] DOB: [birth year] Date of Office Visit: 04/18/2018  Referring provider: Binnie Rail, MD Primary care provider: Binnie Rail, MD  Chief Complaint: Allergic Rhinitis ; Conjunctivitis; and Food Intolerance   History of present illness: [name] is a 72 y.o. female seen today in consultation requested by Billey Gosling, MD.  The patient reports that she had allergy testing done in the 1970s and was found to be allergic to tree pollen molds.  She experiences nasal congestion, rhinorrhea, postnasal drainage, throat clearing, throat irritation, and ocular pruritus.  These symptoms occur year around but are most frequent and severe during the fall.  She has taken cetirizine daily for a long time with moderate benefit.  She is unsure if the ocular pruritus is due to allergies or dry eye syndrome. She is interested in food allergy assessment today as well.  She reports that when she consumes pork her abdomen feels bloated.  She experiences the same bloating when she consumes beings.  In addition, she states that over this past year when eating peanuts, peanut butter, and/or tree nuts her throat has felt "sensitive."  She does not experience urticaria, angioedema, cardiopulmonary symptoms, or other GI symptoms.  Assessment and plan: Perennial and seasonal allergic rhinitis  Aeroallergen avoidance measures have been discussed and provided in written form.  A prescription has been provided for levocetirizine, 5 mg daily as needed.  To avoid diminishing benefit with daily use (tachyphylaxis) of second generation antihistamine, consider alternating every few months between fexofenadine (Allegra) and levocetirizine (Xyzal).  A prescription has been provided for fluticasone nasal spray, 2 sprays per nostril daily as needed. Proper nasal spray technique has been discussed and demonstrated.  Nasal saline spray (i.e., Simply Saline) or nasal saline  lavage (i.e., NeilMed) is recommended as needed and prior to medicated nasal sprays.  Allergic conjunctivitis  Treatment plan as outlined above for allergic rhinitis.  A prescription has been provided for Pataday, one drop per eye daily as needed.  I have also recommended eye lubricant drops (i.e., Natural Tears) as needed.  History of food allergy Patient's history suggest the possibility of food allergy to peanut and tree nut.  The symptoms are somewhat atypical for IgE mediated food allergy, however she did have borderline reactivity to peanut, almond, and hazelnut.  Skin tests were negative to pork and beans despite a positive histamine control.  Laboratory order form has been provided for serum specific IgE against peanut and peanut components, tree nut panel with reflex components, pork, and bean panel.  For now, avoid any food which causes untoward symptoms.   Meds ordered this encounter  Medications  . levocetirizine (XYZAL) 5 MG tablet    Sig: Take 1 tablet (5 mg total) by mouth every evening.    Dispense:  30 tablet    Refill:  5  . Olopatadine HCl (PATADAY) 0.2 % SOLN    Sig: Place 1 drop into both eyes 1 day or 1 dose.    Dispense:  1 Bottle    Refill:  5    Diagnostics: Epicutaneous testing: Robust reactivity to ragweed pollen, weed pollen, tree pollen, and dust mite antigen. Intradermal testing: Positive to molds and cat hair. Food allergen skin testing: Borderline positive to peanut, almond, and hazelnut.    Physical examination: Blood pressure 110/68, pulse 62, temperature 98.2 F (36.8 C), temperature source Oral, resp. rate 16, height 5' 2.28" (1.582 m), weight 162 lb 9.6 oz (  73.8 kg), SpO2 96 %.  General: Alert, interactive, in no acute distress. HEENT: TMs pearly gray, turbinates moderately edematous with clear discharge, post-pharynx moderately erythematous. Neck: Supple without lymphadenopathy. Lungs: Clear to auscultation without wheezing, rhonchi or  rales. CV: Normal S1, S2 without murmurs. Abdomen: Nondistended, nontender. Skin: Warm and dry, without lesions or rashes. Extremities:  No clubbing, cyanosis or edema. Neuro:   Grossly intact.  Review of systems:  Review of systems negative except as noted in HPI / PMHx or noted below: Review of Systems  Constitutional: Negative.   HENT: Negative.   Eyes: Negative.   Respiratory: Negative.   Cardiovascular: Negative.   Gastrointestinal: Negative.   Genitourinary: Negative.   Musculoskeletal: Negative.   Skin: Negative.   Neurological: Negative.   Endo/Heme/Allergies: Negative.   Psychiatric/Behavioral: Negative.     Past medical history:  Past Medical History:  Diagnosis Date  . Allergy    seasonal  . Ankylosing spondylitis ([place])   . Anxiety   . Arthritis   . Bell palsy   . CTS (carpal tunnel syndrome)   . Cystitis   . Diverticulitis   . Diverticulosis of colon   . Dry eye syndrome   . Frozen shoulder   . GERD (gastroesophageal reflux disease)   . HLA B27 (HLA B27 positive)   . Hx of colonic polyps   . Hyperlipidemia    NMR 2005  . Hypertension   . Hypothyroidism   . Incontinence   . Internal hemorrhoids   . Interstitial cystitis   . Kidney stone   . Menopause   . Osteoarthritis   . Osteopenia    [facility]  . S/P total hysterectomy and bilateral salpingo-oophorectomy 04/12/2014  . Sinusitis   . Sleep apnea    c-pap  . Subjective visual disturbance of both eyes 12/30/2013  . Tubular adenoma of colon   . Vitamin D deficiency   . Xerostomia    and Xeroophthalmia    Past surgical history:  Past Surgical History:  Procedure Laterality Date  . ABDOMINAL HYSTERECTOMY    . BLADDER SURGERY     for incontinence  . CARPAL TUNNEL RELEASE Left 12/22/2014   Procedure: LEFT CARPAL TUNNEL RELEASE;  Surgeon: Daryll Brod, MD;  Location: [facility];  Service: Orthopedics;  Laterality: Left;  . COLONOSCOPY W/ POLYPECTOMY      adenomatous poyp 04-2008  . colonscopy     tics 10-2002  . CYSTOSCOPY WITH RETROGRADE PYELOGRAM, URETEROSCOPY AND STENT PLACEMENT Left 09/01/2013   Procedure: CYSTOSCOPY WITH RETROGRADE PYELOGRAM, AND LEFT STENT PLACEMENT;  Surgeon: Molli Hazard, MD;  Location: [facility];  Service: Urology;  Laterality: Left;  . elevated LFTs     due to Elmiron  . FINGER SURGERY Left 2004   2nd finger  . G 1 P 1    . interstitial cystitis     with clot in catheter  . SHOULDER ARTHROSCOPY Left   . TOTAL ABDOMINAL HYSTERECTOMY W/ BILATERAL SALPINGOOPHORECTOMY     dysfunctional menses    Family history: Family History  Problem Relation Age of Onset  . Coronary artery disease Mother   . Hypertension Mother   . Asthma Mother   . Dementia Mother   . Stroke Mother        mini cva  . Diabetes Father   . Cirrhosis Father        non alcoholic  . Cancer Sister        colon  .  Colon cancer Sister 28  . Allergic rhinitis Sister   . Allergic rhinitis Sister   . Food Allergy Sister   . Cancer Paternal Aunt        2 aunts had renal cancer  . Coronary artery disease Maternal Grandfather   . Cancer Maternal Aunt        breast  . Diabetes Paternal Aunt   . Diabetes Paternal Grandmother   . Esophageal cancer Neg Hx   . Stomach cancer Neg Hx     Social history: Social History   Socioeconomic History  . Marital status: Married    Spouse name: Not on file  . Number of children: 1  . Years of education: BS  . Highest education level: Not on file  Occupational History  . Occupation: retired  . Occupation: Pharmacist, hospital    Comment: Kindergarten  Social Needs  . Financial resource strain: Not hard at all  . Food insecurity:    Worry: Never true    Inability: Never true  . Transportation needs:    Medical: No    Non-medical: No  Tobacco Use  . Smoking status: Never Smoker  . Smokeless tobacco: Never Used  Substance and Sexual Activity  . Alcohol use: Not Currently    Comment:      . Drug  use: No  . Sexual activity: Not Currently  Lifestyle  . Physical activity:    Days per week: 0 days    Minutes per session: 0 min  . Stress: Rather much  Relationships  . Social connections:    Talks on phone: More than three times a week    Gets together: More than three times a week    Attends religious service: Not on file    Active member of club or organization: Not on file    Attends meetings of clubs or organizations: Not on file    Relationship status: Married  . Intimate partner violence:    Fear of current or ex partner: No    Emotionally abused: No    Physically abused: No    Forced sexual activity: No  Other Topics Concern  . Not on file  Social History Narrative   Lives   Caffeine use:    Married      Environmental History: The patient lives in a 74 year old house with carpeting throughout, gas heat, and central air.  She is a non-smoker without pets.  There is mold/water damage in the home.  Allergies as of 04/18/2018      Reactions   Nitroglycerin Anaphylaxis   Hypotensive after NTG administration in ER during chest pain evaluation Pt states she almost died,  Pt states in a procedure done in 10/2016 she had no reaction to this medication, so not sure if it is an actual allergy.    Pentosan Polysulfate Sodium    Elevated LFTs.......Marland Kitchen elmiron    Sulfamethoxazole-trimethoprim Nausea Only   Unknown reaction per pt, Pt thinks she may have felt sick to her stomach.    Bactrim [sulfamethoxazole-trimethoprim]    Promethazine Hcl    "jittery on the inside"   Lisinopril Cough      Medication List        Accurate as of 04/18/18  5:12 PM. Always use your most recent med list.          acetaminophen 500 MG tablet Commonly known as:  TYLENOL Take 500 mg by mouth every 4 (four) hours as needed for mild pain.   aspirin 81  MG tablet Take 81 mg by mouth daily.   BIOTIN PO Take 1 tablet by mouth daily.   blood glucose meter kit and supplies Kit Dispense based  on patient and insurance preference. Use up to four times daily as directed. prediabetes   cetirizine 10 MG tablet Commonly known as:  ZYRTEC Take 10 mg by mouth daily.   Diclofenac Sodium 2 % Soln Place onto the skin as directed for pain as needed.   diphenoxylate-atropine 2.5-0.025 MG tablet Commonly known as:  LOMOTIL Take 1 tablet by mouth 4 (four) times daily as needed for diarrhea or loose stools.   FISH OIL BURP-LESS 1000 MG Caps Takes 1000 mg BID   gabapentin 100 MG capsule Commonly known as:  NEURONTIN Take 1 capsule (100 mg total) by mouth at bedtime.   hydrochlorothiazide 12.5 MG capsule Commonly known as:  MICROZIDE Take 1 capsule (12.5 mg total) by mouth daily.   levocetirizine 5 MG tablet Commonly known as:  XYZAL Take 1 tablet (5 mg total) by mouth every evening.   lipase/protease/amylase 36000 UNITS Cpep capsule Commonly known as:  CREON Take 2 capsules (72,000 Units total) by mouth 3 (three) times daily with meals. 1 capsule with snacks (max 8 capsules daily)   Pancrelipase (Lip-Prot-Amyl) 40000-126000 units Cpep Take 2-3 capsules by mouth 3 (three) times daily with meals. 1 capsule with each snack   meloxicam 15 MG tablet Commonly known as:  MOBIC TAKE 1 TABLET (15 MG TOTAL) BY MOUTH DAILY AS NEEDED FOR PAIN. WITH FOOD   Olopatadine HCl 0.2 % Soln Place 1 drop into both eyes 1 day or 1 dose.   ranitidine 150 MG tablet Commonly known as:  ZANTAC Take 1 tablet (150 mg total) by mouth 2 (two) times daily.   rosuvastatin 5 MG tablet Commonly known as:  CRESTOR Take 1 tablet (5 mg total) by mouth daily.   SYSTANE OP Apply 1 drop to eye as needed. Apply to both eyes as needed   vitamin B-12 1000 MCG tablet Commonly known as:  CYANOCOBALAMIN Take 1,000 mcg by mouth daily.   vitamin C 1000 MG tablet Take 1,000 mg by mouth daily.   vitamin E 400 UNIT capsule Take 400 Units by mouth daily.       Known medication allergies: Allergies    Allergen Reactions  . Nitroglycerin Anaphylaxis    Hypotensive after NTG administration in ER during chest pain evaluation Pt states she almost died,  Pt states in a procedure done in 10/2016 she had no reaction to this medication, so not sure if it is an actual allergy.   . Pentosan Polysulfate Sodium     Elevated LFTs.......Marland Kitchen elmiron   . Sulfamethoxazole-Trimethoprim Nausea Only    Unknown reaction per pt, Pt thinks she may have felt sick to her stomach.   . Bactrim [Sulfamethoxazole-Trimethoprim]   . Promethazine Hcl     "jittery on the inside"  . Lisinopril Cough    I appreciate the opportunity to take part in Taquila's care. Please do not hesitate to contact me with questions.  Sincerely,   R. Edgar Frisk, MD

## 2018-04-18 NOTE — Assessment & Plan Note (Signed)
   Treatment plan as outlined above for allergic rhinitis.  A prescription has been provided for Pataday, one drop per eye daily as needed.  I have also recommended eye lubricant drops (i.e., Natural Tears) as needed. 

## 2018-04-18 NOTE — Assessment & Plan Note (Signed)
   Aeroallergen avoidance measures have been discussed and provided in written form.  A prescription has been provided for levocetirizine, 5 mg daily as needed.  To avoid diminishing benefit with daily use (tachyphylaxis) of second generation antihistamine, consider alternating every few months between fexofenadine (Allegra) and levocetirizine (Xyzal).  A prescription has been provided for fluticasone nasal spray, 2 sprays per nostril daily as needed. Proper nasal spray technique has been discussed and demonstrated.  Nasal saline spray (i.e., Simply Saline) or nasal saline lavage (i.e., NeilMed) is recommended as needed and prior to medicated nasal sprays. 

## 2018-04-22 ENCOUNTER — Telehealth: Payer: Self-pay

## 2018-04-22 DIAGNOSIS — T7800XD Anaphylactic reaction due to unspecified food, subsequent encounter: Secondary | ICD-10-CM

## 2018-04-22 DIAGNOSIS — H5213 Myopia, bilateral: Secondary | ICD-10-CM | POA: Diagnosis not present

## 2018-04-22 DIAGNOSIS — H2513 Age-related nuclear cataract, bilateral: Secondary | ICD-10-CM | POA: Diagnosis not present

## 2018-04-22 DIAGNOSIS — H25013 Cortical age-related cataract, bilateral: Secondary | ICD-10-CM | POA: Diagnosis not present

## 2018-04-22 DIAGNOSIS — T7840XD Allergy, unspecified, subsequent encounter: Secondary | ICD-10-CM

## 2018-04-22 DIAGNOSIS — H524 Presbyopia: Secondary | ICD-10-CM | POA: Diagnosis not present

## 2018-04-22 DIAGNOSIS — K9049 Malabsorption due to intolerance, not elsewhere classified: Secondary | ICD-10-CM

## 2018-04-22 DIAGNOSIS — L500 Allergic urticaria: Secondary | ICD-10-CM

## 2018-04-22 NOTE — Telephone Encounter (Signed)
She had stated in her history that she had GI symptoms associated with the consumption of beans.  If her insurance will not pay for black pain, we may omit this lab, and any other lab that she does not want to have based upon needing to pay out-of-pocket.  I do believe it is important that we check serum specific IgE against peanut, peanut components,  and tree nut panel with reflex components.  Thanks.

## 2018-04-22 NOTE — Telephone Encounter (Signed)
Patient called.  States [facility] informed her Medicare and [employer] will not pay for black bean lab testing.  ([id number]-Black Bean IgE).    Patient stated Dr. Verlin Fester was looking for Peanut, Tree Nuts, (specifically  Almond and Hazelnut).  Patient not sure why Dr. Verlin Fester is testing for beans.  Do you want patient to get other tests and omit black bean?  Please advise. Patient does not want to pay 100% out of pocket for any tests.

## 2018-04-23 DIAGNOSIS — E039 Hypothyroidism, unspecified: Secondary | ICD-10-CM | POA: Diagnosis not present

## 2018-04-23 DIAGNOSIS — I10 Essential (primary) hypertension: Secondary | ICD-10-CM | POA: Diagnosis not present

## 2018-04-23 DIAGNOSIS — Z79899 Other long term (current) drug therapy: Secondary | ICD-10-CM | POA: Insufficient documentation

## 2018-04-23 DIAGNOSIS — M545 Low back pain: Secondary | ICD-10-CM | POA: Insufficient documentation

## 2018-04-23 DIAGNOSIS — M5442 Lumbago with sciatica, left side: Secondary | ICD-10-CM | POA: Diagnosis not present

## 2018-04-23 DIAGNOSIS — Z7982 Long term (current) use of aspirin: Secondary | ICD-10-CM | POA: Diagnosis not present

## 2018-04-23 NOTE — Telephone Encounter (Signed)
Left message for patient to call back  

## 2018-04-23 NOTE — Progress Notes (Signed)
[name] [address] [place], [place] [address] Phone: [phone] Subjective:   Fontaine [name], am serving as a scribe for Dr. Hulan Saas.    CC: Back pain  TAV:[id number]  [name] is a 72 y.o. female coming in with complaint of back pain. Had twinges of pain in the back since last visit but had a muscle spasm that started yesterday when she woke up. She got up and felt as if she couldn't walk. Pain was bad enough that she took herself into the ER. Does have radiating pain in posterior aspect of left leg. Was given percocet which helped her sleep last night. States that she cannot get comfortable today.  Patient has known ankylosing spondylitis.  Patient has had flares previously.  Has been having more difficulty recently.  Denies any association with bowel or bladder at the moment.      Past Medical History:  Diagnosis Date  . Allergy    seasonal  . Ankylosing spondylitis ([place])   . Anxiety   . Arthritis   . Bell palsy   . CTS (carpal tunnel syndrome)   . Cystitis   . Diverticulitis   . Diverticulosis of colon   . Dry eye syndrome   . Frozen shoulder   . GERD (gastroesophageal reflux disease)   . HLA B27 (HLA B27 positive)   . Hx of colonic polyps   . Hyperlipidemia    NMR 2005  . Hypertension   . Hypothyroidism   . Incontinence   . Internal hemorrhoids   . Interstitial cystitis   . Kidney stone   . Menopause   . Osteoarthritis   . Osteopenia    [facility]MD done [facility]  . S/P total hysterectomy and bilateral salpingo-oophorectomy 04/12/2014  . Sinusitis   . Sleep apnea    c-pap  . Subjective visual disturbance of both eyes 12/30/2013  . Tubular adenoma of colon   . Vitamin D deficiency   . Xerostomia    and Xeroophthalmia   Past Surgical History:  Procedure Laterality Date  . ABDOMINAL HYSTERECTOMY    . BLADDER SURGERY     for incontinence  . CARPAL TUNNEL RELEASE Left 12/22/2014   Procedure: LEFT CARPAL  TUNNEL RELEASE;  Surgeon: Daryll Brod, MD;  Location: [facility];  Service: Orthopedics;  Laterality: Left;  . COLONOSCOPY W/ POLYPECTOMY     adenomatous poyp 04-2008  . colonscopy     tics 10-2002  . CYSTOSCOPY WITH RETROGRADE PYELOGRAM, URETEROSCOPY AND STENT PLACEMENT Left 09/01/2013   Procedure: CYSTOSCOPY WITH RETROGRADE PYELOGRAM, AND LEFT STENT PLACEMENT;  Surgeon: Molli Hazard, MD;  Location: [facility];  Service: Urology;  Laterality: Left;  . elevated LFTs     due to Elmiron  . FINGER SURGERY Left 2004   2nd finger  . G 1 P 1    . interstitial cystitis     with clot in catheter  . SHOULDER ARTHROSCOPY Left   . TOTAL ABDOMINAL HYSTERECTOMY W/ BILATERAL SALPINGOOPHORECTOMY     dysfunctional menses   Social History   Socioeconomic History  . Marital status: Married    Spouse name: Not on file  . Number of children: 1  . Years of education: BS  . Highest education level: Not on file  Occupational History  . Occupation: retired  . Occupation: Pharmacist, hospital    Comment: Kindergarten  Social Needs  . Financial resource strain: Not hard at all  .  Food insecurity:    Worry: Never true    Inability: Never true  . Transportation needs:    Medical: [name]    Non-medical: [name]  Tobacco Use  . Smoking status: Never Smoker  . Smokeless tobacco: Never Used  Substance and Sexual Activity  . Alcohol use: Not Currently    Comment:      . Drug use: [name]  . Sexual activity: Not Currently  Lifestyle  . Physical activity:    Days per week: 0 days    Minutes per session: 0 min  . Stress: Rather much  Relationships  . Social connections:    Talks on phone: More than three times a week    Gets together: More than three times a week    Attends religious service: Not on file    Active member of club or organization: Not on file    Attends meetings of clubs or organizations: Not on file    Relationship status: Married  Other Topics Concern  . Not on file  Social History  Narrative   Lives   Caffeine use:    Married      Allergies  Allergen Reactions  . Nitroglycerin Anaphylaxis    Hypotensive after NTG administration in ER during chest pain evaluation Pt states she almost died,  Pt states in a procedure done in 10/2016 she had [name] reaction to this medication, so not sure if it is an actual allergy.   . Pentosan Polysulfate Sodium     Elevated LFTs.......Marland Kitchen elmiron   . Sulfamethoxazole-Trimethoprim Nausea Only    Unknown reaction per pt, Pt thinks she may have felt sick to her stomach.   . Bactrim [Sulfamethoxazole-Trimethoprim]   . Promethazine Hcl     "jittery on the inside"  . Lisinopril Cough   Family History  Problem Relation Age of Onset  . Coronary artery disease Mother   . Hypertension Mother   . Asthma Mother   . Dementia Mother   . Stroke Mother        mini cva  . Diabetes Father   . Cirrhosis Father        non alcoholic  . Cancer Sister        colon  . Colon cancer Sister 21  . Allergic rhinitis Sister   . Allergic rhinitis Sister   . Food Allergy Sister   . Cancer Paternal Aunt        2 aunts had renal cancer  . Coronary artery disease Maternal Grandfather   . Cancer Maternal Aunt        breast  . Diabetes Paternal Aunt   . Diabetes Paternal Grandmother   . Esophageal cancer Neg Hx   . Stomach cancer Neg Hx      Current Outpatient Medications (Cardiovascular):  .  hydrochlorothiazide (MICROZIDE) 12.5 MG capsule, Take 1 capsule (12.5 mg total) by mouth daily. .  rosuvastatin (CRESTOR) 5 MG tablet, Take 1 tablet (5 mg total) by mouth daily.  Current Outpatient Medications (Respiratory):  .  cetirizine (ZYRTEC) 10 MG tablet, Take 10 mg by mouth daily.   Marland Kitchen  levocetirizine (XYZAL) 5 MG tablet, Take 1 tablet (5 mg total) by mouth every evening.  Current Outpatient Medications (Analgesics):  .  acetaminophen (TYLENOL) 500 MG tablet, Take 500 mg by mouth every 4 (four) hours as needed for mild pain.  Marland Kitchen  aspirin 81 MG  tablet, Take 81 mg by mouth daily.   .  meloxicam (MOBIC) 15 MG tablet, TAKE  1 TABLET (15 MG TOTAL) BY MOUTH DAILY AS NEEDED FOR PAIN. WITH FOOD .  oxyCODONE-acetaminophen (PERCOCET) 5-325 MG tablet, Take 1 tablet by mouth every 4 (four) hours as needed for moderate pain.  Current Outpatient Medications (Hematological):  .  vitamin B-12 (CYANOCOBALAMIN) 1000 MCG tablet, Take 1,000 mcg by mouth daily.   Current Outpatient Medications (Other):  Marland Kitchen  Ascorbic Acid (VITAMIN C) 1000 MG tablet, Take 1,000 mg by mouth daily.  Marland Kitchen  BIOTIN PO, Take 1 tablet by mouth daily. .  blood glucose meter kit and supplies KIT, Dispense based on patient and insurance preference. Use up to four times daily as directed. prediabetes .  Diclofenac Sodium 2 % SOLN, Place onto the skin as directed for pain as needed. .  diphenoxylate-atropine (LOMOTIL) 2.5-0.025 MG tablet, Take 1 tablet by mouth 4 (four) times daily as needed for diarrhea or loose stools. .  gabapentin (NEURONTIN) 100 MG capsule, Take 1 capsule (100 mg total) by mouth at bedtime. (Patient taking differently: Take 100 mg by mouth as needed. ) .  lipase/protease/amylase (CREON) 36000 UNITS CPEP capsule, Take 2 capsules (72,000 Units total) by mouth 3 (three) times daily with meals. 1 capsule with snacks (max 8 capsules daily) .  Olopatadine HCl (PATADAY) 0.2 % SOLN, Place 1 drop into both eyes 1 day or 1 dose. .  Omega-3 Fatty Acids (FISH OIL BURP-LESS) 1000 MG CAPS, Takes 1000 mg BID .  ondansetron (ZOFRAN) 4 MG tablet, Take 1 tablet (4 mg total) by mouth every 6 (six) hours as needed for nausea or vomiting. .  orphenadrine (NORFLEX) 100 MG tablet, Take 1 tablet (100 mg total) by mouth 2 (two) times daily. .  Pancrelipase, Lip-Prot-Amyl, (ZENPEP) 40000-126000 units CPEP, Take 2-3 capsules by mouth 3 (three) times daily with meals. 1 capsule with each snack .  Polyethyl Glycol-Propyl Glycol (SYSTANE OP), Apply 1 drop to eye as needed. Apply to both eyes as  needed .  ranitidine (ZANTAC) 150 MG tablet, Take 1 tablet (150 mg total) by mouth 2 (two) times daily. .  vitamin E 400 UNIT capsule, Take 400 Units by mouth daily.    Past medical history, social, surgical and family history all reviewed in electronic medical record.  [name] pertanent information unless stated regarding to the chief complaint.   Review of Systems:  [name] headache, visual changes, nausea, vomiting, diarrhea, constipation, dizziness, abdominal pain, skin rash, fevers, chills, night sweats, weight loss, swollen lymph nodes,  chest pain, shortness of breath, mood changes.  Positive muscle aches, body aches  Objective  Blood pressure 120/90, pulse 75, height _0  (1.575 m), weight 168 lb (76.2 kg), SpO2 97 %.    General: [name] apparent distress alert and oriented x3 mood and affect normal, dressed appropriately.  HEENT: Pupils equal, extraocular movements intact  Respiratory: Patient's speak in full sentences and does not appear short of breath  Cardiovascular: [name] lower extremity edema, non tender, [name] erythema  Skin: Warm dry intact with [name] signs of infection or rash on extremities or on axial skeleton.  Abdomen: Soft nontender  Neuro: Cranial nerves II through XII are intact, neurovascularly intact in all extremities with 2+ DTRs and 2+ pulses.  Lymph: [name] lymphadenopathy of posterior or anterior cervical chain or axillae bilaterally.  Gait antalgic MSK:  tender with mild limited range of motion and good stability and symmetric strength and tone of shoulders, elbows, wrist, hip, knee and ankles bilaterally.  Significant arthritis of the left shoulder  Back exam shows  increase in kyphosis of the mid back and loss of lordosis of the lumbar spine.  Increasing tenderness with muscle spasm noted bilaterally paraspinal musculature in the thoracolumbar juncture as well as the lumbosacral junction right greater than left.  Straight leg test does not cause radicular symptoms with significant  tightness.  Unable to do exercises regularly.  Unable to also do Corky Sox test secondary to tightness and pain Neurovascularly intact distally   Impression and Recommendations:     This case required medical decision making of moderate complexity. The above documentation has been reviewed and is accurate and complete Lyndal Pulley, DO       Note: This dictation was prepared with Dragon dictation along with smaller phrase technology. Any transcriptional errors that result from this process are unintentional.

## 2018-04-24 ENCOUNTER — Ambulatory Visit (INDEPENDENT_AMBULATORY_CARE_PROVIDER_SITE_OTHER): Payer: Medicare Other | Admitting: Family Medicine

## 2018-04-24 ENCOUNTER — Encounter (HOSPITAL_BASED_OUTPATIENT_CLINIC_OR_DEPARTMENT_OTHER): Payer: Self-pay

## 2018-04-24 ENCOUNTER — Emergency Department (HOSPITAL_BASED_OUTPATIENT_CLINIC_OR_DEPARTMENT_OTHER)
Admission: EM | Admit: 2018-04-24 | Discharge: 2018-04-24 | Disposition: A | Discharge status: Home / Self Care | Payer: Medicare Other | Attending: Emergency Medicine | Admitting: Emergency Medicine

## 2018-04-24 ENCOUNTER — Other Ambulatory Visit: Payer: Self-pay

## 2018-04-24 VITALS — BP 120/90 | HR 75 | Ht 62.0 in | Wt 168.0 lb

## 2018-04-24 DIAGNOSIS — M5442 Lumbago with sciatica, left side: Secondary | ICD-10-CM

## 2018-04-24 DIAGNOSIS — M5416 Radiculopathy, lumbar region: Secondary | ICD-10-CM

## 2018-04-24 DIAGNOSIS — I2510 Atherosclerotic heart disease of native coronary artery without angina pectoris: Secondary | ICD-10-CM

## 2018-04-24 DIAGNOSIS — M545 Low back pain: Secondary | ICD-10-CM | POA: Diagnosis not present

## 2018-04-24 DIAGNOSIS — M459 Ankylosing spondylitis of unspecified sites in spine: Secondary | ICD-10-CM

## 2018-04-24 MED ORDER — OXYCODONE-ACETAMINOPHEN 5-325 MG PO TABS
1.0000 | ORAL_TABLET | Freq: Once | ORAL | Status: AC
  Administered 2018-04-24: 1 via ORAL
  Filled 2018-04-24: qty 1

## 2018-04-24 MED ORDER — KETOROLAC TROMETHAMINE 60 MG/2ML IM SOLN
60.0000 mg | Freq: Once | INTRAMUSCULAR | Status: AC
  Administered 2018-04-24: 60 mg via INTRAMUSCULAR

## 2018-04-24 MED ORDER — OXYCODONE-ACETAMINOPHEN 5-325 MG PO TABS: 1.0000 | ORAL_TABLET | ORAL | 0 refills | Status: DC | PRN

## 2018-04-24 MED ORDER — ORPHENADRINE CITRATE ER 100 MG PO TB12: 100.0000 mg | ORAL_TABLET | Freq: Two times a day (BID) | ORAL | 0 refills | Status: DC

## 2018-04-24 MED ORDER — ONDANSETRON HCL 4 MG PO TABS: 4.0000 mg | ORAL_TABLET | Freq: Four times a day (QID) | ORAL | 0 refills | Status: DC | PRN

## 2018-04-24 MED ORDER — ONDANSETRON 8 MG PO TBDP
8.0000 mg | ORAL_TABLET | Freq: Once | ORAL | Status: AC
  Administered 2018-04-24: 8 mg via ORAL
  Filled 2018-04-24: qty 1

## 2018-04-24 MED ORDER — METHYLPREDNISOLONE ACETATE 80 MG/ML IJ SUSP
80.0000 mg | Freq: Once | INTRAMUSCULAR | Status: AC
  Administered 2018-04-24: 80 mg via INTRAMUSCULAR

## 2018-04-24 MED ORDER — CYCLOBENZAPRINE HCL 10 MG PO TABS
10.0000 mg | ORAL_TABLET | Freq: Once | ORAL | Status: AC
  Administered 2018-04-24: 10 mg via ORAL
  Filled 2018-04-24: qty 1

## 2018-04-24 MED FILL — OXYCODONE-ACETAMINOPHEN 5-3: 5-325 | 3 days supply | Qty: 10 | Fill #0

## 2018-04-24 MED FILL — ONDANSETRON HCL 4 MG TABLET: 4 | 5 days supply | Qty: 20 | Fill #0

## 2018-04-24 MED FILL — ORPHENADRINE 100 MG TAB SA: 100 | 15 days supply | Qty: 30 | Fill #0

## 2018-04-24 NOTE — ED Notes (Signed)
Pt. Reports no injury with pain in the low back on the L side into the L back side of the L leg.

## 2018-04-24 NOTE — ED Triage Notes (Signed)
Bilateral lower back pain since last night that has gone unrelieved by tylenol and mobic, denies groin numbness, denies new incontinence

## 2018-04-24 NOTE — Patient Instructions (Signed)
Good to see you  Ice is your friend Can take the flexeril at night for relief 2 injections today to help with pain all over See me again in 1-2 weeks!

## 2018-04-24 NOTE — Assessment & Plan Note (Signed)
Patient is having a flare.  Discussed icing regimen and home exercises.  Discussed which activities to do which was to avoid.  Increase activity as tolerated.  Patient given 2 injections today.  Has medicines for breakthrough pain.  Will call if worsening symptoms and will may need advanced imaging.  Patient knows if any weakness numbness bowel or bladder incontinence to seek medical attention immediately.

## 2018-04-24 NOTE — Discharge Instructions (Addendum)
Continue taking meloxicam once a day. Continue applying ice as needed. Continue taking acetaminophen as needed for less severe pain. Reserve oxycodone-acetaminophen for severe pain.

## 2018-04-24 NOTE — Telephone Encounter (Signed)
Left message x2.

## 2018-04-24 NOTE — ED Provider Notes (Signed)
[facility] DEPART[facility] Provider Note   CSN: [id number] Arrival date & time: 04/23/18  2356     History   Chief Complaint Chief Complaint  Patient presents with  . Back Pain    HPI [name] is a 72 y.o. female.  The history is provided by the patient.  Back Pain    She has a history of ankylosing spondylitis, hyperlipidemia, hypertension, urinary incontinence and comes in complaining of low back pain with some radiation down the left leg.  Pain was present when she woke up this morning.  She has history of intermittent back pain in the past, but this episode is more severe.  She tried applying ice, taking acetaminophen, and taking meloxicam with only very slight relief.  She denies any change in her pre-existing urinary continence and denies any fecal incontinence.  She denies any weakness, numbness, tingling.  There was no unusual activity preceding this episode.  Pain is worse with movement, better with laying still.  She currently rates pain at 9/10.  Past Medical History:  Diagnosis Date  . Allergy    seasonal  . Ankylosing spondylitis ([place])   . Anxiety   . Arthritis   . Bell palsy   . CTS (carpal tunnel syndrome)   . Cystitis   . Diverticulitis   . Diverticulosis of colon   . Dry eye syndrome   . Frozen shoulder   . GERD (gastroesophageal reflux disease)   . HLA B27 (HLA B27 positive)   . Hx of colonic polyps   . Hyperlipidemia    NMR 2005  . Hypertension   . Hypothyroidism   . Incontinence   . Internal hemorrhoids   . Interstitial cystitis   . Kidney stone   . Menopause   . Osteoarthritis   . Osteopenia    [facility]MD done [facility]  . S/P total hysterectomy and bilateral salpingo-oophorectomy 04/12/2014  . Sinusitis   . Sleep apnea    c-pap  . Subjective visual disturbance of both eyes 12/30/2013  . Tubular adenoma of colon   . Vitamin D deficiency   . Xerostomia    and Xeroophthalmia    Patient Active Problem List     Diagnosis Date Noted  . Perennial and seasonal allergic rhinitis 04/18/2018  . History of food allergy 04/18/2018  . Blister of great toe of right foot 03/04/2018  . Onychomycosis of toenail 03/04/2018  . Hypercalcemia 09/17/2017  . Left wrist pain 09/17/2017  . Synovitis of left knee 05/22/2017  . Diabetes ([place]) 05/02/2017  . Trigger point of left shoulder region 03/27/2017  . Taste disorder 02/22/2017  . Anxiety 02/12/2017  . Caregiver with fatigue 01/17/2017  . Psychosocial stressors 01/17/2017  . Fatigue 01/15/2017  . Decreased sense of taste 01/15/2017  . Metallic taste 09/62/8366  . At high risk for falls 11/16/2016  . Family history of osteoporosis 11/16/2016  . Atherosclerotic plaque 11/07/2016  . 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 Renal insufficiency 06/29/2016  . Osteoporosis 06/21/2016  . Dizziness 05/16/2016  . Arthritis of left shoulder region 01/17/2016  . Ankylosing spondylitis ([place]) 11/08/2015  . Incontinence 10/28/2015  . Thoracic back pain 10/19/2015  . Leg cramping 09/29/2015  . 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 Impingement syndrome of left shoulder region 09/22/2014  . Interstitial cystitis 07/09/2014  . Diverticulosis of colon without hemorrhage 07/08/2014  . Carpal tunnel syndrome of left wrist 04/12/2014  .  Food allergy 03/06/2014  . Renal calculi 01/30/2014  . OSA (obstructive sleep apnea), moderate 01/14/2014  . Allergic conjunctivitis 12/06/2013  . History of Bell's palsy 02/26/2012  . Vitamin D deficiency 06/27/2010  . 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  . DRY MOUTH 06/27/2010  . ABDOMINAL PAIN, SUPRAPUBIC 05/25/2009  . Gastroesophageal reflux disease 04/30/2009  . DIVERTICULOSIS, COLON 04/30/2009  . History of colonic polyps 04/30/2009  . COSTOCHONDRITIS 01/15/2008  . HYPERLIPIDEMIA 08/21/2007  . Essential hypertension 08/21/2007  . ARTHRALGIA 08/21/2007  . Chronic lumbar  radiculopathy 01/25/2007    Past Surgical History:  Procedure Laterality Date  . ABDOMINAL HYSTERECTOMY    . BLADDER SURGERY     for incontinence  . CARPAL TUNNEL RELEASE Left 12/22/2014   Procedure: LEFT CARPAL TUNNEL RELEASE;  Surgeon: Daryll Brod, MD;  Location: [facility];  Service: Orthopedics;  Laterality: Left;  . COLONOSCOPY W/ POLYPECTOMY     adenomatous poyp 04-2008  . colonscopy     tics 10-2002  . CYSTOSCOPY WITH RETROGRADE PYELOGRAM, URETEROSCOPY AND STENT PLACEMENT Left 09/01/2013   Procedure: CYSTOSCOPY WITH RETROGRADE PYELOGRAM, AND LEFT STENT PLACEMENT;  Surgeon: Molli Hazard, MD;  Location[facility];  Service: Urology;  Laterality: Left;  . elevated LFTs     due to Elmiron  . FINGER SURGERY Left 2004   2nd finger  . G 1 P 1    . interstitial cystitis     with clot in catheter  . SHOULDER ARTHROSCOPY Left   . TOTAL ABDOMINAL HYSTERECTOMY W/ BILATERAL SALPINGOOPHORECTOMY     dysfunctional menses     OB History   None      Home Medications    Prior to Admission medications   Medication Sig Start Date End Date Taking? Authorizing Provider  acetaminophen (TYLENOL) 500 MG tablet Take 500 mg by mouth every 4 (four) hours as needed for mild pain.     [provider]  Ascorbic Acid (VITAMIN C) 1000 MG tablet Take 1,000 mg by mouth daily.     [provider]  aspirin 81 MG tablet Take 81 mg by mouth daily.      [provider]  BIOTIN PO Take 1 tablet by mouth daily.    [provider]  blood glucose meter kit and supplies KIT Dispense based on patient and insurance preference. Use up to four times daily as directed. prediabetes 10/28/15   Binnie Rail, MD  cetirizine (ZYRTEC) 10 MG tablet Take 10 mg by mouth daily.      [provider]  Diclofenac Sodium 2 % SOLN Place onto the skin as directed for pain as needed.    [provider]  diphenoxylate-atropine (LOMOTIL) 2.5-0.025 MG tablet Take 1  tablet by mouth 4 (four) times daily as needed for diarrhea or loose stools. 04/08/18   Pyrtle, Lajuan Lines, MD  gabapentin (NEURONTIN) 100 MG capsule Take 1 capsule (100 mg total) by mouth at bedtime. Patient taking differently: Take 100 mg by mouth as needed.  01/16/18   Lyndal Pulley, DO  hydrochlorothiazide (MICROZIDE) 12.5 MG capsule Take 1 capsule (12.5 mg total) by mouth daily. 03/22/18   Dorothy Spark, MD  levocetirizine (XYZAL) 5 MG tablet Take 1 tablet (5 mg total) by mouth every evening. 04/18/18   Bobbitt, Sedalia Muta, MD  lipase/protease/amylase (CREON) 36000 UNITS CPEP capsule Take 2 capsules (72,000 Units total) by mouth 3 (three) times daily with meals. 1 capsule with snacks (max 8 capsules daily) 03/12/18  Pyrtle, Lajuan Lines, MD  meloxicam (MOBIC) 15 MG tablet TAKE 1 TABLET (15 MG TOTAL) BY MOUTH DAILY AS NEEDED FOR PAIN. WITH FOOD 04/04/18   Binnie Rail, MD  Olopatadine HCl (PATADAY) 0.2 % SOLN Place 1 drop into both eyes 1 day or 1 dose. 04/18/18   Bobbitt, [place] Muta, MD  Omega-3 Fatty Acids (FISH OIL BURP-LESS) 1000 MG CAPS Takes 1000 mg BID 01/03/16   Esterwood, Amy S, PA-C  Pancrelipase, Lip-Prot-Amyl, (ZENPEP) 40000-126000 units CPEP Take 2-3 capsules by mouth 3 (three) times daily with meals. 1 capsule with each snack 04/08/18   Pyrtle, Lajuan Lines, MD  Polyethyl Glycol-Propyl Glycol (SYSTANE OP) Apply 1 drop to eye as needed. Apply to both eyes as needed    [provider]  ranitidine (ZANTAC) 150 MG tablet Take 1 tablet (150 mg total) by mouth 2 (two) times daily. 04/08/18   Pyrtle, Lajuan Lines, MD  rosuvastatin (CRESTOR) 5 MG tablet Take 1 tablet (5 mg total) by mouth daily. 05/11/17 04/18/18  Dorothy Spark, MD  vitamin B-12 (CYANOCOBALAMIN) 1000 MCG tablet Take 1,000 mcg by mouth daily.     [provider]  vitamin E 400 UNIT capsule Take 400 Units by mouth daily.    [provider]    Family History Family History  Problem Relation Age of Onset  . Coronary  artery disease Mother   . Hypertension Mother   . Asthma Mother   . Dementia Mother   . Stroke Mother        mini cva  . Diabetes Father   . Cirrhosis Father        non alcoholic  . Cancer Sister        colon  . Colon cancer Sister 66  . Allergic rhinitis Sister   . Allergic rhinitis Sister   . Food Allergy Sister   . Cancer Paternal Aunt        2 aunts had renal cancer  . Coronary artery disease Maternal Grandfather   . Cancer Maternal Aunt        breast  . Diabetes Paternal Aunt   . Diabetes Paternal Grandmother   . Esophageal cancer Neg Hx   . Stomach cancer Neg Hx     Social History Social History   Tobacco Use  . Smoking status: Never Smoker  . Smokeless tobacco: Never Used  Substance Use Topics  . Alcohol use: Not Currently    Comment:      . Drug use: No     Allergies   Nitroglycerin; Pentosan polysulfate sodium; Sulfamethoxazole-trimethoprim; Bactrim [sulfamethoxazole-trimethoprim]; Promethazine hcl; and Lisinopril   Review of Systems Review of Systems  Musculoskeletal: Positive for back pain.  All other systems reviewed and are negative.    Physical Exam Updated Vital Signs BP (!) 150/92 (BP Location: Left Arm)   Pulse 74   Temp 98.4 F (36.9 C) (Oral)   Resp 18   SpO2 98%   Physical Exam  Nursing note and vitals reviewed.  72 year old female, resting comfortably and in no acute distress. Vital signs are significant for elevated blood pressure. Oxygen saturation is 98%, which is normal. Head is normocephalic and atraumatic. PERRLA, EOMI. Oropharynx is clear. Neck is nontender and supple without adenopathy or JVD. Back has moderate tenderness in the left upper paralumbar area with moderate left paralumbar spasm.  There is no CVA tenderness.  Straight leg raise is positive on the left at 60 degrees. Lungs are clear without rales,  wheezes, or rhonchi. Chest is nontender. Heart has regular rate and rhythm without murmur. Abdomen is soft, flat,  nontender without masses or hepatosplenomegaly and peristalsis is normoactive. Extremities have no cyanosis or edema, full range of motion is present. Skin is warm and dry without rash. Neurologic: Mental status is normal, cranial nerves are intact, there are no motor or sensory deficits.  ED Treatments / Results   Procedures Procedures  Medications Ordered in ED Medications  cyclobenzaprine (FLEXERIL) tablet 10 mg (10 mg Oral Given 04/24/18 0133)  oxyCODONE-acetaminophen (PERCOCET/ROXICET) 5-325 MG per tablet 1 tablet (1 tablet Oral Given 04/24/18 0133)     Initial Impression / Assessment and Plan / ED Course  I have reviewed the triage vital signs and the nursing notes.  CT angiogram of abdomen done last year showed no evidence of aortic aneurysm, marked degenerative changes noted in the lumbar and thoracic spine.  No indication for imaging today.  She is advised to continue taking meloxicam, continue applying ice.  Prescription given for orphenadrine and oxycodone-acetaminophen.  Follow-up with PCP as needed.  Final Clinical Impressions(s) / ED Diagnoses   Final diagnoses:  Acute left-sided low back pain with left-sided sciatica    ED Discharge Orders         Ordered    orphenadrine (NORFLEX) 100 MG tablet  2 times daily     04/24/18 0136    oxyCODONE-acetaminophen (PERCOCET) 5-325 MG tablet  Every 4 hours PRN,   Status:  Discontinued     04/24/18 0136    ondansetron (ZOFRAN) 4 MG tablet  Every 6 hours PRN     04/24/18 0136    oxyCODONE-acetaminophen (PERCOCET) 5-325 MG tablet  Every 4 hours PRN     04/24/18 0141           Delora Fuel, MD 59/73/31 [phone]

## 2018-04-25 NOTE — Telephone Encounter (Signed)
The lab technician didn't say but I assume we can associate any symptoms related to her issues to the lab order.

## 2018-04-25 NOTE — Telephone Encounter (Signed)
Pt called back she wanted to know what to do about her lab orders not being covered. I spoke with the lab and they explained that most of the time medicare does not cover food allergy testing, she recommended [place] sending new orders with additional diagnosis codes to try to get it approved, that's all we can do. Please advise.

## 2018-04-25 NOTE — Telephone Encounter (Signed)
What additional dx codes do they recommend?

## 2018-04-25 NOTE — Telephone Encounter (Signed)
Please put in the diagnostic codes for food allergy (food allergy with anaphylaxis), allergic reaction, food intolerance, and allergic urticaria.  Thanks.

## 2018-04-26 MED ORDER — EPINEPHRINE 0.3 MG/0.3ML IJ SOAJ: INTRAMUSCULAR | 2 refills | Status: DC

## 2018-04-26 NOTE — Telephone Encounter (Signed)
Pt informed

## 2018-04-29 ENCOUNTER — Other Ambulatory Visit: Payer: Self-pay | Admitting: Allergy and Immunology

## 2018-04-29 DIAGNOSIS — T7840XD Allergy, unspecified, subsequent encounter: Secondary | ICD-10-CM | POA: Diagnosis not present

## 2018-04-29 DIAGNOSIS — Z91018 Allergy to other foods: Secondary | ICD-10-CM | POA: Diagnosis not present

## 2018-05-01 ENCOUNTER — Ambulatory Visit: Payer: Self-pay | Admitting: Internal Medicine

## 2018-05-02 ENCOUNTER — Telehealth: Payer: Self-pay | Admitting: *Deleted

## 2018-05-02 MED ORDER — OLOPATADINE HCL 0.7 % OP SOLN: 1.0000 [drp] | Freq: Every day | OPHTHALMIC | 5 refills | Status: DC

## 2018-05-02 MED FILL — PAZEO 0.7% EYE DROPS: 0.7 | 25 days supply | Qty: 3 | Fill #0

## 2018-05-02 NOTE — Progress Notes (Signed)
Subjective:    Patient ID: [name], female    DOB: Feb 01, 1946, 72 y.o.   MRN: [id number]  HPI The patient is here for follow up.  Hypertension: She is taking her medication daily. She is compliant with a low sodium diet.  She denies chest pain, palpitations, edema, shortness of breath and regular headaches. She is not exercising regularly.  She does monitor her blood pressure at home-it has been well controlled.    Diabetes: She is taking her medication daily as prescribed. She is compliant with a diabetic diet. She is not exercising regularly.  She checks her feet daily and denies foot lesions. She is up-to-date with an ophthalmology examination.   Hyperlipidemia: She is taking her medication daily. She is compliant with a low fat/cholesterol diet. She is not exercising regularly. She denies myalgias.   GERD:  She is taking her medication daily as prescribed.  She denies any GERD symptoms and feels her GERD is well controlled.   Dizziness/lightheadedness:  It is mostly gone but on occasion she will have transient dizziness with quick movement.   Medications and allergies reviewed with patient and updated if appropriate.  Patient Active Problem List   Diagnosis Date Noted  . Perennial and seasonal allergic rhinitis 04/18/2018  . History of food allergy 04/18/2018  . Blister of great toe of right foot 03/04/2018  . Onychomycosis of toenail 03/04/2018  . Hypercalcemia 09/17/2017  . Left wrist pain 09/17/2017  . Synovitis of left knee 05/22/2017  . Diabetes ([place]) 05/02/2017  . Trigger point of left shoulder region 03/27/2017  . Taste disorder 02/22/2017  . Anxiety 02/12/2017  . Caregiver with fatigue 01/17/2017  . Psychosocial stressors 01/17/2017  . Fatigue 01/15/2017  . Decreased sense of taste 01/15/2017  . Metallic taste 73/22/0254  . At high risk for falls 11/16/2016  . Family history of osteoporosis 11/16/2016  . Atherosclerotic plaque 11/07/2016  . Chronic or  recurrent subluxation of peroneal tendon of left foot 09/13/2016  . Renal insufficiency 06/29/2016  . Osteoporosis 06/21/2016  . Dizziness 05/16/2016  . Arthritis of left shoulder region 01/17/2016  . Ankylosing spondylitis ([place]) 11/08/2015  . Incontinence 10/28/2015  . Thoracic back pain 10/19/2015  . Leg cramping 09/29/2015  . 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 Impingement syndrome of left shoulder region 09/22/2014  . Interstitial cystitis 07/09/2014  . Diverticulosis of colon without hemorrhage 07/08/2014  . Carpal tunnel syndrome of left wrist 04/12/2014  . Food allergy 03/06/2014  . Renal calculi 01/30/2014  . OSA (obstructive sleep apnea), moderate 01/14/2014  . Allergic conjunctivitis 12/06/2013  . History of Bell's palsy 02/26/2012  . Vitamin D deficiency 06/27/2010  . 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  . DRY MOUTH 06/27/2010  . ABDOMINAL PAIN, SUPRAPUBIC 05/25/2009  . Gastroesophageal reflux disease 04/30/2009  . History of colonic polyps 04/30/2009  . COSTOCHONDRITIS 01/15/2008  . HYPERLIPIDEMIA 08/21/2007  . Essential hypertension 08/21/2007  . ARTHRALGIA 08/21/2007  . Chronic lumbar radiculopathy 01/25/2007    Current Outpatient Medications on File Prior to Visit  Medication Sig Dispense Refill  . acetaminophen (TYLENOL) 500 MG tablet Take 500 mg by mouth every 4 (four) hours as needed for mild pain.     . Ascorbic Acid (VITAMIN C) 1000 MG tablet Take 1,000 mg by mouth daily.     Marland Kitchen aspirin 81 MG tablet Take 81 mg by mouth daily.      Marland Kitchen BIOTIN PO Take 1 tablet by mouth daily.    Marland Kitchen  blood glucose meter kit and supplies KIT Dispense based on patient and insurance preference. Use up to four times daily as directed. prediabetes 1 each 0  . cetirizine (ZYRTEC) 10 MG tablet Take 10 mg by mouth daily.      . Diclofenac Sodium 2 % SOLN Place onto the skin as directed for pain as needed.    . diphenoxylate-atropine (LOMOTIL) 2.5-0.025 MG tablet Take 1 tablet by  mouth 4 (four) times daily as needed for diarrhea or loose stools. 90 tablet 2  . EPINEPHrine (EPIPEN 2-PAK) 0.3 mg/0.3 mL IJ SOAJ injection Use as directed for severe allergic reactions 2 Device 2  . gabapentin (NEURONTIN) 100 MG capsule Take 1 capsule (100 mg total) by mouth at bedtime. (Patient taking differently: Take 100 mg by mouth as needed. ) 30 capsule 3  . hydrochlorothiazide (MICROZIDE) 12.5 MG capsule Take 1 capsule (12.5 mg total) by mouth daily. 90 capsule 1  . levocetirizine (XYZAL) 5 MG tablet Take 1 tablet (5 mg total) by mouth every evening. 30 tablet 5  . lipase/protease/amylase (CREON) 36000 UNITS CPEP capsule Take 2 capsules (72,000 Units total) by mouth 3 (three) times daily with meals. 1 capsule with snacks (max 8 capsules daily) 240 capsule 0  . meloxicam (MOBIC) 15 MG tablet TAKE 1 TABLET (15 MG TOTAL) BY MOUTH DAILY AS NEEDED FOR PAIN. WITH FOOD 90 tablet 2  . Olopatadine HCl (PAZEO) 0.7 % SOLN Place 1 drop into both eyes daily. 1 Bottle 5  . Omega-3 Fatty Acids (FISH OIL BURP-LESS) 1000 MG CAPS Takes 1000 mg BID 60 capsule 0  . ondansetron (ZOFRAN) 4 MG tablet Take 1 tablet (4 mg total) by mouth every 6 (six) hours as needed for nausea or vomiting. 20 tablet 0  . orphenadrine (NORFLEX) 100 MG tablet Take 1 tablet (100 mg total) by mouth 2 (two) times daily. 30 tablet 0  . oxyCODONE-acetaminophen (PERCOCET) 5-325 MG tablet Take 1 tablet by mouth every 4 (four) hours as needed for moderate pain. 10 tablet 0  . Pancrelipase, Lip-Prot-Amyl, (ZENPEP) 40000-126000 units CPEP Take 2-3 capsules by mouth 3 (three) times daily with meals. 1 capsule with each snack 84 capsule 0  . Polyethyl Glycol-Propyl Glycol (SYSTANE OP) Apply 1 drop to eye as needed. Apply to both eyes as needed    . ranitidine (ZANTAC) 150 MG tablet Take 1 tablet (150 mg total) by mouth 2 (two) times daily. 60 tablet 3  . vitamin B-12 (CYANOCOBALAMIN) 1000 MCG tablet Take 1,000 mcg by mouth daily.     . vitamin E  400 UNIT capsule Take 400 Units by mouth daily.    . rosuvastatin (CRESTOR) 5 MG tablet Take 1 tablet (5 mg total) by mouth daily. 90 tablet 2   No current facility-administered medications on file prior to visit.     Past Medical History:  Diagnosis Date  . Allergy    seasonal  . Ankylosing spondylitis ([place])   . Anxiety   . Arthritis   . Bell palsy   . CTS (carpal tunnel syndrome)   . Cystitis   . Diverticulitis   . Diverticulosis of colon   . Dry eye syndrome   . Frozen shoulder   . GERD (gastroesophageal reflux disease)   . HLA B27 (HLA B27 positive)   . Hx of colonic polyps   . Hyperlipidemia    NMR 2005  . Hypertension   . Hypothyroidism   . Incontinence   . Internal hemorrhoids   . Interstitial  cystitis   . Kidney stone   . Menopause   . Osteoarthritis   . Osteopenia    [facility]  . S/P total hysterectomy and bilateral salpingo-oophorectomy 04/12/2014  . Sinusitis   . Sleep apnea    c-pap  . Subjective visual disturbance of both eyes 12/30/2013  . Tubular adenoma of colon   . Vitamin D deficiency   . Xerostomia    and Xeroophthalmia    Past Surgical History:  Procedure Laterality Date  . ABDOMINAL HYSTERECTOMY    . BLADDER SURGERY     for incontinence  . CARPAL TUNNEL RELEASE Left 12/22/2014   Procedure: LEFT CARPAL TUNNEL RELEASE;  Surgeon: Daryll Brod, MD;  Location: [facility];  Service: Orthopedics;  Laterality: Left;  . COLONOSCOPY W/ POLYPECTOMY     adenomatous poyp 04-2008  . colonscopy     tics 10-2002  . CYSTOSCOPY WITH RETROGRADE PYELOGRAM, URETEROSCOPY AND STENT PLACEMENT Left 09/01/2013   Procedure: CYSTOSCOPY WITH RETROGRADE PYELOGRAM, AND LEFT STENT PLACEMENT;  Surgeon: Molli Hazard, MD;  Location: [facility];  Service: Urology;  Laterality: Left;  . elevated LFTs     due to [place]  . FINGER SURGERY Left 2004   2nd finger  . G 1 P 1    . interstitial cystitis     with clot in catheter  .  SHOULDER ARTHROSCOPY Left   . TOTAL ABDOMINAL HYSTERECTOMY W/ BILATERAL SALPINGOOPHORECTOMY     dysfunctional menses    Social History   Socioeconomic History  . Marital status: Married    Spouse name: Not on file  . Number of children: 1  . Years of education: BS  . Highest education level: Not on file  Occupational History  . Occupation: retired  . Occupation: Pharmacist, hospital    Comment: Kindergarten  Social Needs  . Financial resource strain: Not hard at all  . Food insecurity:    Worry: Never true    Inability: Never true  . Transportation needs:    Medical: No    Non-medical: No  Tobacco Use  . Smoking status: Never Smoker  . Smokeless tobacco: Never Used  Substance and Sexual Activity  . Alcohol use: Not Currently    Comment:      . Drug use: No  . Sexual activity: Not Currently  Lifestyle  . Physical activity:    Days per week: 0 days    Minutes per session: 0 min  . Stress: Rather much  Relationships  . Social connections:    Talks on phone: More than three times a week    Gets together: More than three times a week    Attends religious service: Not on file    Active member of club or organization: Not on file    Attends meetings of clubs or organizations: Not on file    Relationship status: Married  Other Topics Concern  . Not on file  Social History Narrative   Lives   Caffeine use:    Married       Family History  Problem Relation Age of Onset  . Coronary artery disease Mother   . Hypertension Mother   . Asthma Mother   . Dementia Mother   . Stroke Mother        mini cva  . Diabetes Father   . Cirrhosis Father        non alcoholic  . Cancer Sister  colon  . Colon cancer Sister 5  . Allergic rhinitis Sister   . Allergic rhinitis Sister   . Food Allergy Sister   . Cancer Paternal Aunt        2 aunts had renal cancer  . Coronary artery disease Maternal Grandfather   . Cancer Maternal Aunt        breast  . Diabetes Paternal Aunt   .  Diabetes Paternal Grandmother   . Esophageal cancer Neg Hx   . Stomach cancer Neg Hx     Review of Systems  Constitutional: Positive for diaphoresis. Negative for chills and fever.  HENT: Positive for postnasal drip, sinus pain and voice change.   Respiratory: Positive for cough (? allergy). Negative for shortness of breath and wheezing.   Cardiovascular: Positive for leg swelling (occasional, mild). Negative for chest pain and palpitations.  Neurological: Positive for dizziness (occ with quick movements), light-headedness (occ with quick movements) and headaches (occ).       Objective:   Vitals:   05/03/18 0817  BP: 134/86  Pulse: 68  Resp: 16  Temp: 98 F (36.7 C)  SpO2: 98%   BP Readings from Last 3 Encounters:  05/03/18 134/86  04/24/18 120/90  04/24/18 140/89   Wt Readings from Last 3 Encounters:  05/03/18 161 lb (73 kg)  04/24/18 168 lb (76.2 kg)  04/18/18 162 lb 9.6 oz (73.8 kg)   Body mass index is 29.45 kg/m.   Physical Exam    Constitutional: Appears well-developed and well-nourished. No distress.  HENT:  Head: Normocephalic and atraumatic.  Neck: Neck supple. No tracheal deviation present. No thyromegaly present.  No cervical lymphadenopathy Cardiovascular: Normal rate, regular rhythm and normal heart sounds.   No murmur heard. No carotid bruit .  No edema Pulmonary/Chest: Effort normal and breath sounds normal. No respiratory distress. No has no wheezes. No rales.  Skin: Skin is warm and dry. Not diaphoretic.  Psychiatric: Normal mood and affect. Behavior is normal.      Assessment & Plan:    See Problem List for Assessment and Plan of chronic medical problems.

## 2018-05-02 NOTE — Telephone Encounter (Signed)
Pt insurance does not cover pataday, alternatives are olopatadine 0.1, olopatadine (pazeo) 0.7, azelastine, or epiastine. Please advise.

## 2018-05-02 NOTE — Patient Instructions (Addendum)
  Tests ordered today. Your results will be released to MyChart (or called to you) after review, usually within 72hours after test completion. If any changes need to be made, you will be notified at that same time.  Medications reviewed and updated.  Changes include :   none      Please followup in 6 months   

## 2018-05-02 NOTE — Addendum Note (Signed)
Addended by: Katherina Right D on: 05/02/2018 02:06 PM   Modules accepted: Orders

## 2018-05-02 NOTE — Telephone Encounter (Signed)
We can do Pazeo 1 drop per eye daily.  Salvatore Marvel, MD [facility]

## 2018-05-03 ENCOUNTER — Ambulatory Visit (INDEPENDENT_AMBULATORY_CARE_PROVIDER_SITE_OTHER): Payer: Medicare Other | Admitting: Internal Medicine

## 2018-05-03 ENCOUNTER — Other Ambulatory Visit (INDEPENDENT_AMBULATORY_CARE_PROVIDER_SITE_OTHER): Payer: Medicare Other

## 2018-05-03 ENCOUNTER — Encounter: Payer: Self-pay | Admitting: Internal Medicine

## 2018-05-03 VITALS — BP 134/86 | HR 68 | Temp 98.0°F | Resp 16 | Ht 62.0 in | Wt 161.0 lb

## 2018-05-03 DIAGNOSIS — I2510 Atherosclerotic heart disease of native coronary artery without angina pectoris: Secondary | ICD-10-CM | POA: Diagnosis not present

## 2018-05-03 DIAGNOSIS — I10 Essential (primary) hypertension: Secondary | ICD-10-CM

## 2018-05-03 DIAGNOSIS — R42 Dizziness and giddiness: Secondary | ICD-10-CM

## 2018-05-03 DIAGNOSIS — E119 Type 2 diabetes mellitus without complications: Secondary | ICD-10-CM | POA: Diagnosis not present

## 2018-05-03 DIAGNOSIS — K219 Gastro-esophageal reflux disease without esophagitis: Secondary | ICD-10-CM

## 2018-05-03 DIAGNOSIS — E782 Mixed hyperlipidemia: Secondary | ICD-10-CM

## 2018-05-03 LAB — COMPREHENSIVE METABOLIC PANEL
ALBUMIN: 4.5 g/dL (ref 3.5–5.2)
ALT: 24 U/L (ref 0–35)
AST: 20 U/L (ref 0–37)
Alkaline Phosphatase: 87 U/L (ref 39–117)
BUN: 23 mg/dL (ref 6–23)
CHLORIDE: 102 meq/L (ref 96–112)
CO2: 27 meq/L (ref 19–32)
CREATININE: 1.17 mg/dL (ref 0.40–1.20)
Calcium: 10.1 mg/dL (ref 8.4–10.5)
GFR: 48.03 mL/min — ABNORMAL LOW (ref >60.00)
GLUCOSE: 111 mg/dL — AB (ref 70–99)
Potassium: 3.9 mEq/L (ref 3.5–5.1)
SODIUM: 139 meq/L (ref 135–145)
Total Bilirubin: 0.5 mg/dL (ref 0.2–1.2)
Total Protein: 7.8 g/dL (ref 6.0–8.3)

## 2018-05-03 LAB — PANEL 604726
Cor A 1 IgE: 1.56 kU/L — AB
Cor A 14 IgE: <0.1 kU/L
Cor A 8 IgE: <0.1 kU/L
Cor A 9 IgE: <0.1 kU/L

## 2018-05-03 LAB — PEANUT COMPONENTS
F352-IgE Ara h 8: 0.51 kU/L — AB
F422-IgE Ara h 1: <0.1 kU/L
F423-IgE Ara h 2: <0.1 kU/L
F424-IgE Ara h 3: <0.1 kU/L
F427-IgE Ara h 9: <0.1 kU/L
F447-IgE Ara h 6: <0.1 kU/L

## 2018-05-03 LAB — IGE NUT PROF. W/COMPONENT RFLX
Brazil Nut IgE: <0.1 kU/L
F020-IgE Almond: <0.1 kU/L
F202-IgE Cashew Nut: <0.1 kU/L
F203-IgE Pistachio Nut: <0.1 kU/L
Hazelnut (Filbert) IgE: 1.17 kU/L — AB
Macadamia Nut, IgE: <0.1 kU/L
Peanut, IgE: 0.27 kU/L — AB
Pecan Nut IgE: <0.1 kU/L
Walnut IgE: <0.1 kU/L

## 2018-05-03 LAB — LIPID PANEL
CHOL/HDL RATIO: 2
Cholesterol: 150 mg/dL (ref 0–200)
HDL: 65.9 mg/dL (ref >39.00)
LDL Cholesterol: 65 mg/dL (ref 0–99)
NONHDL: 83.97
TRIGLYCERIDES: 95 mg/dL (ref 0.0–149.0)
VLDL: 19 mg/dL (ref 0.0–40.0)

## 2018-05-03 LAB — F296-IGE CAROB BEAN: Carob Bean IgE: <0.1 kU/L

## 2018-05-03 LAB — HEMOGLOBIN A1C: HEMOGLOBIN A1C: 6.6 % — AB (ref 4.6–6.5)

## 2018-05-03 LAB — ALLERGEN, GREEN BEAN, RF315: Allergen Green Bean IgE: <0.1 kU/L

## 2018-05-03 LAB — ALLERGEN COMPONENT COMMENTS

## 2018-05-03 LAB — ALLERGEN, WHITE BEAN, F15: White Bean IgE: <0.1 kU/L

## 2018-05-03 LAB — ALLERGEN, BEAN BLACK
Black Bean*, IgE: <0.35 kU/L (ref <0.35)
Class Interpretation: 0

## 2018-05-03 LAB — ALLERGEN, PORK, F26: Pork IgE: 0.14 kU/L — AB

## 2018-05-03 LAB — ALLERGEN, PEANUT F13

## 2018-05-03 LAB — ALLERGEN, KIDNEY BEAN, RF287: Kidney Bean IgE: <0.1 kU/L

## 2018-05-03 LAB — F182-IGE LIMA BEAN: Lima Bean IgE: 0.1 kU/L — AB

## 2018-05-03 LAB — K071-IGE CASTOR BEAN: Castor Bean IgE: <0.1 kU/L

## 2018-05-03 NOTE — Assessment & Plan Note (Signed)
Lightheadedness/dizziness has resolved, but on occasion if she makes a quick movement she will feel very transient lightheadedness/dizziness Has seen ENT

## 2018-05-03 NOTE — Assessment & Plan Note (Signed)
a1c Low sugar/carb diet Increase activity

## 2018-05-03 NOTE — Assessment & Plan Note (Signed)
GERD controlled Continue daily medication  

## 2018-05-03 NOTE — Assessment & Plan Note (Signed)
Check lipid panel  Continue daily statin Regular exercise and healthy diet encouraged  

## 2018-05-03 NOTE — Assessment & Plan Note (Signed)
BP well controlled Current regimen effective and well tolerated Continue current medications at current doses cmp  

## 2018-05-05 ENCOUNTER — Encounter: Payer: Self-pay | Admitting: Internal Medicine

## 2018-05-06 NOTE — Progress Notes (Signed)
[name] [place],  [address] Phone: [phone] Subjective:   IJacqualin [name], am serving as a scribe for Dr. Hulan Saas.:    CC: Back pain and right knee pain  [id number]  [name] is a 72 y.o. female coming in with complaint of left lower back pain. Pain is constant with varying intensities. Stair climbing makes her pain worse. She occasionally takes Norflex as needed. Pain with riding in the car as the seat in her car tilts back towards the backrest.  Found to have significant muscle tightness.  Patient did have x-rays that were independently visualized by me showing severe osteoarthritic changes of multiple joints and patient does have an HLA-B27 positive.  Overall continues to have radicular symptoms.  Is affecting daily activities.  Can wake her up at night.  Patient also notes pain in the right knee which was similar to the pain she had in left knee when she had a bakers cyst. She said deep knee bending increases her pain.  Left knee did have a Baker's cyst.  Having worsening discomfort and pain.  Patient is concerned that she would have the rupture on the left side as well and does not want that.    Past Medical History:  Diagnosis Date  . Allergy    seasonal  . Ankylosing spondylitis ([place])   . Anxiety   . Arthritis   . Bell palsy   . CTS (carpal tunnel syndrome)   . Cystitis   . Diverticulitis   . Diverticulosis of colon   . Dry eye syndrome   . Frozen shoulder   . GERD (gastroesophageal reflux disease)   . HLA B27 (HLA B27 positive)   . Hx of colonic polyps   . Hyperlipidemia    NMR 2005  . Hypertension   . Hypothyroidism   . Incontinence   . Internal hemorrhoids   . Interstitial cystitis   . Kidney stone   . Menopause   . Osteoarthritis   . Osteopenia    [facility]  . S/P total hysterectomy and bilateral salpingo-oophorectomy 04/12/2014  . Sinusitis   . Sleep apnea    c-pap  . Subjective visual disturbance of both eyes 12/30/2013  . Tubular adenoma of colon   . Vitamin D deficiency   . Xerostomia    and Xeroophthalmia   Past Surgical History:  Procedure Laterality Date  . ABDOMINAL HYSTERECTOMY    . BLADDER SURGERY     for incontinence  . CARPAL TUNNEL RELEASE Left 12/22/2014   Procedure: LEFT CARPAL TUNNEL RELEASE;  Surgeon: Daryll Brod, MD;  Location: [facility];  Service: Orthopedics;  Laterality: Left;  . COLONOSCOPY W/ POLYPECTOMY     adenomatous poyp 04-2008  . colonscopy     tics 10-2002  . CYSTOSCOPY WITH RETROGRADE PYELOGRAM, URETEROSCOPY AND STENT PLACEMENT Left 09/01/2013   Procedure: CYSTOSCOPY WITH RETROGRADE PYELOGRAM, AND LEFT STENT PLACEMENT;  Surgeon: Molli Hazard, MD;  Location: [facility];  Service: Urology;  Laterality: Left;  . elevated LFTs     due to Elmiron  . FINGER SURGERY Left 2004   2nd finger  . G 1 P 1    . interstitial cystitis     with clot in catheter  . SHOULDER ARTHROSCOPY Left   . TOTAL ABDOMINAL HYSTERECTOMY W/ BILATERAL SALPINGOOPHORECTOMY     dysfunctional menses   Social History   Socioeconomic History  . Marital  status: Married    Spouse name: Not on file  . Number of children: 1  . Years of education: BS  . Highest education level: Not on file  Occupational History  . Occupation: retired  . Occupation: Pharmacist, hospital    Comment: Kindergarten  Social Needs  . Financial resource strain: Not hard at all  . Food insecurity:    Worry: Never true    Inability: Never true  . Transportation needs:    Medical: No    Non-medical: No  Tobacco Use  . Smoking status: Never Smoker  . Smokeless tobacco: Never Used  Substance and Sexual Activity  . Alcohol use: Not Currently    Comment:      . Drug use: No  . Sexual activity: Not Currently  Lifestyle  . Physical activity:    Days per week: 0 days    Minutes per session: 0 min  . Stress: Rather much  Relationships  . Social  connections:    Talks on phone: More than three times a week    Gets together: More than three times a week    Attends religious service: Not on file    Active member of club or organization: Not on file    Attends meetings of clubs or organizations: Not on file    Relationship status: Married  Other Topics Concern  . Not on file  Social History Narrative   Lives   Caffeine use:    Married      Allergies  Allergen Reactions  . Nitroglycerin Anaphylaxis    Hypotensive after NTG administration in ER during chest pain evaluation Pt states she almost died,  Pt states in a procedure done in 10/2016 she had no reaction to this medication, so not sure if it is an actual allergy.   . Pentosan Polysulfate Sodium     Elevated LFTs.......Marland Kitchen elmiron   . Sulfamethoxazole-Trimethoprim Nausea Only    Unknown reaction per pt, Pt thinks she may have felt sick to her stomach.   . Bactrim [Sulfamethoxazole-Trimethoprim]   . Promethazine Hcl     "jittery on the inside"  . Lisinopril Cough   Family History  Problem Relation Age of Onset  . Coronary artery disease Mother   . Hypertension Mother   . Asthma Mother   . Dementia Mother   . Stroke Mother        mini cva  . Diabetes Father   . Cirrhosis Father        non alcoholic  . Cancer Sister        colon  . Colon cancer Sister 65  . Allergic rhinitis Sister   . Allergic rhinitis Sister   . Food Allergy Sister   . Cancer Paternal Aunt        2 aunts had renal cancer  . Coronary artery disease Maternal Grandfather   . Cancer Maternal Aunt        breast  . Diabetes Paternal Aunt   . Diabetes Paternal Grandmother   . Esophageal cancer Neg Hx   . Stomach cancer Neg Hx      Current Outpatient Medications (Cardiovascular):  Marland Kitchen  EPINEPHrine (EPIPEN 2-PAK) 0.3 mg/0.3 mL IJ SOAJ injection, Use as directed for severe allergic reactions .  hydrochlorothiazide (MICROZIDE) 12.5 MG capsule, Take 1 capsule (12.5 mg total) by mouth daily. .   rosuvastatin (CRESTOR) 5 MG tablet, Take 1 tablet (5 mg total) by mouth daily.  Current Outpatient Medications (Respiratory):  .  levocetirizine (XYZAL)  5 MG tablet, Take 1 tablet (5 mg total) by mouth every evening.  Current Outpatient Medications (Analgesics):  .  acetaminophen (TYLENOL) 500 MG tablet, Take 500 mg by mouth every 4 (four) hours as needed for mild pain.  Marland Kitchen  aspirin 81 MG tablet, Take 81 mg by mouth daily.   .  meloxicam (MOBIC) 15 MG tablet, TAKE 1 TABLET (15 MG TOTAL) BY MOUTH DAILY AS NEEDED FOR PAIN. WITH FOOD  Current Outpatient Medications (Hematological):  .  vitamin B-12 (CYANOCOBALAMIN) 1000 MCG tablet, Take 1,000 mcg by mouth daily.   Current Outpatient Medications (Other):  Marland Kitchen  Ascorbic Acid (VITAMIN C) 1000 MG tablet, Take 1,000 mg by mouth daily.  Marland Kitchen  BIOTIN PO, Take 1 tablet by mouth daily. .  blood glucose meter kit and supplies KIT, Dispense based on patient and insurance preference. Use up to four times daily as directed. prediabetes .  Diclofenac Sodium 2 % SOLN, Place onto the skin as directed for pain as needed. .  diphenoxylate-atropine (LOMOTIL) 2.5-0.025 MG tablet, Take 1 tablet by mouth 4 (four) times daily as needed for diarrhea or loose stools. .  gabapentin (NEURONTIN) 100 MG capsule, Take 1 capsule (100 mg total) by mouth at bedtime. (Patient taking differently: Take 100 mg by mouth as needed. ) .  lipase/protease/amylase (CREON) 36000 UNITS CPEP capsule, Take 2 capsules (72,000 Units total) by mouth 3 (three) times daily with meals. 1 capsule with snacks (max 8 capsules daily) .  Olopatadine HCl (PAZEO) 0.7 % SOLN, Place 1 drop into both eyes daily. .  Omega-3 Fatty Acids (FISH OIL BURP-LESS) 1000 MG CAPS, Takes 1000 mg BID .  orphenadrine (NORFLEX) 100 MG tablet, Take 1 tablet (100 mg total) by mouth 2 (two) times daily. .  Pancrelipase, Lip-Prot-Amyl, (ZENPEP) 40000-126000 units CPEP, Take 2-3 capsules by mouth 3 (three) times daily with meals. 1  capsule with each snack .  Polyethyl Glycol-Propyl Glycol (SYSTANE OP), Apply 1 drop to eye as needed. Apply to both eyes as needed .  vitamin E 400 UNIT capsule, Take 400 Units by mouth daily. .  diazepam (VALIUM) 5 MG tablet, One tab by mouth, 2 hours before procedure.    Past medical history, social, surgical and family history all reviewed in electronic medical record.  No pertanent information unless stated regarding to the chief complaint.   Review of Systems:  No headache, visual changes, nausea, vomiting, diarrhea, constipation, dizziness, abdominal pain, skin rash, fevers, chills, night sweats, weight loss, swollen lymph nodes,chest pain, shortness of breath, mood changes.  Positive muscle aches, joint swelling, body aches  Objective  Blood pressure 128/88, pulse 64, height 5' 2" (1.575 m), weight 162 lb (73.5 kg), SpO2 97 %.   General: No apparent distress alert and oriented x3 mood and affect normal, dressed appropriately.  HEENT: Pupils equal, extraocular movements intact  Respiratory: Patient's speak in full sentences and does not appear short of breath  Cardiovascular: Trace lower extremity edema, non tender, no erythema  Skin: Warm dry intact with no signs of infection or rash on extremities or on axial skeleton.  Abdomen: Soft nontender  Neuro: Cranial nerves II through XII are intact, neurovascularly intact in all extremities with 2+ DTRs and 2+ pulses.  Lymph: No lymphadenopathy of posterior or anterior cervical chain or axillae bilaterally.  Arthritic changes of multiple joints noted Gait abnormality of gait MSK:  tender with full range of motion and good stability and symmetric strength and tone of , elbows, wrist, hip,  and ankles bilaterally.  Severe arthritis of the left shoulder noted Knee: Right valgus deformity noted. Large thigh to calf ratio.  Tender to palpation over medial and PF joint line.  ROM full in flexion and extension and lower leg  rotation. instability with valgus force.  painful patellar compression. Patellar glide with moderate crepitus. Patellar and quadriceps tendons unremarkable. Hamstring and quadriceps strength is normal. Contralateral knee shows arthritic changes as well  Back exam shows some loss of lordosis with a degenerative scoliosis.  Severe tenderness in the paraspinal musculature right greater than left.  Loss of range of motion in all planes and a positive straight leg test on the right side.  This is at 20 degrees.  Neurovascularly intact distally with deep tendon reflexes intact.  Procedure: Real-time Ultrasound Guided Injection of right Baker's cyst Device: GE Logiq Q7 Ultrasound guided injection is preferred based studies that show increased duration, increased effect, greater accuracy, decreased procedural pain, increased response rate, and decreased cost with ultrasound guided versus blind injection.  Verbal informed consent obtained.  Time-out conducted.  Noted no overlying erythema, induration, or other signs of local infection.  Skin prepped in a sterile fashion.  Local anesthesia: Topical Ethyl chloride.  With sterile technique and under real time ultrasound guidance: With a 22-gauge 2 inch needle patient was injected with 4 cc of 0.5% Marcaine and aspirated 35 cc of clear fluid then injected 1 cc of Kenalog 40 mg/dL. This was from a posterior approach Completed without difficulty  Pain immediately resolved suggesting accurate placement of the medication.  Advised to call if fevers/chills, erythema, induration, drainage, or persistent bleeding.  Images permanently stored and available for review in the ultrasound unit.  Impression: Technically successful ultrasound guided injection.    Impression and Recommendations:     This case required medical decision making of moderate complexity. The above documentation has been reviewed and is accurate and complete Lyndal Pulley, DO        Note: This dictation was prepared with Dragon dictation along with smaller phrase technology. Any transcriptional errors that result from this process are unintentional.

## 2018-05-07 ENCOUNTER — Ambulatory Visit (INDEPENDENT_AMBULATORY_CARE_PROVIDER_SITE_OTHER): Payer: Medicare Other | Admitting: Family Medicine

## 2018-05-07 ENCOUNTER — Encounter: Payer: Self-pay | Admitting: Family Medicine

## 2018-05-07 ENCOUNTER — Ambulatory Visit: Payer: Self-pay

## 2018-05-07 VITALS — BP 128/88 | HR 64 | Ht 62.0 in | Wt 162.0 lb

## 2018-05-07 DIAGNOSIS — M7121 Synovial cyst of popliteal space [Baker], right knee: Secondary | ICD-10-CM | POA: Diagnosis not present

## 2018-05-07 DIAGNOSIS — M25561 Pain in right knee: Secondary | ICD-10-CM

## 2018-05-07 DIAGNOSIS — G8929 Other chronic pain: Secondary | ICD-10-CM

## 2018-05-07 DIAGNOSIS — M5416 Radiculopathy, lumbar region: Secondary | ICD-10-CM

## 2018-05-07 DIAGNOSIS — I2510 Atherosclerotic heart disease of native coronary artery without angina pectoris: Secondary | ICD-10-CM | POA: Diagnosis not present

## 2018-05-07 DIAGNOSIS — M459 Ankylosing spondylitis of unspecified sites in spine: Secondary | ICD-10-CM

## 2018-05-07 MED ORDER — DIAZEPAM 5 MG PO TABS: ORAL_TABLET | ORAL | 0 refills | Status: DC

## 2018-05-07 NOTE — Assessment & Plan Note (Signed)
History of this.  Patient's back x-rays do show severe degenerative disc disease at multiple levels.  I am concerned for more of a spinal stenosis that could be contributing as well.  Patient is going to start to increase activity.  I would like patient to have an MRI though.  Patient could be a candidate for epidurals.  Patient will follow-up with me after the MRI and will discuss further.

## 2018-05-07 NOTE — Assessment & Plan Note (Signed)
Aspiration done today.  Discussed icing regimen and home exercises.  Discussed activities to doing which wants to avoid.  Increase activity slowly over the course the next several days.  Follow-up with me again in 4 to 8 weeks

## 2018-05-07 NOTE — Patient Instructions (Signed)
Good to see you  MRi lumbar call [phone] I will write you again  Aspirated the right knee today  See me again in 4-6 weeks

## 2018-05-08 ENCOUNTER — Other Ambulatory Visit: Payer: Self-pay | Admitting: Internal Medicine

## 2018-05-08 DIAGNOSIS — K219 Gastro-esophageal reflux disease without esophagitis: Secondary | ICD-10-CM

## 2018-05-08 MED FILL — EPINEPHRINE 0.3 MG AUTO-INJ: 0.3 | 2 days supply | Qty: 2 | Fill #0

## 2018-05-08 MED FILL — OMEPRAZOLE 40 MG CPDR: 40 | 30 days supply | Qty: 30 | Fill #0

## 2018-05-08 MED FILL — TRIAMCINOLONE ACETONIDE 55: 55 | 30 days supply | Qty: 17 | Fill #0

## 2018-05-08 MED FILL — diazePAM 5 MG TABS: 5 | 2 days supply | Qty: 2 | Fill #0

## 2018-05-08 NOTE — Telephone Encounter (Signed)
Per Dr Hilarie Fredrickson verbal order, okay for patient to change back to omeprazole in place of ranitidine twice daily since ranitidine is no longer available in lite of the fact it was recently found to have minute amounts of carcinogens.

## 2018-05-09 ENCOUNTER — Ambulatory Visit
Admission: RE | Admit: 2018-05-09 | Discharge: 2018-05-09 | Disposition: A | Discharge status: Home / Self Care | Payer: Medicare Other | Source: Ambulatory Visit | Attending: Family Medicine | Admitting: Family Medicine

## 2018-05-09 DIAGNOSIS — M48061 Spinal stenosis, lumbar region without neurogenic claudication: Secondary | ICD-10-CM | POA: Diagnosis not present

## 2018-05-09 DIAGNOSIS — M5416 Radiculopathy, lumbar region: Secondary | ICD-10-CM

## 2018-05-13 DIAGNOSIS — H538 Other visual disturbances: Secondary | ICD-10-CM | POA: Diagnosis not present

## 2018-05-13 DIAGNOSIS — H5713 Ocular pain, bilateral: Secondary | ICD-10-CM | POA: Diagnosis not present

## 2018-05-17 ENCOUNTER — Encounter (HOSPITAL_BASED_OUTPATIENT_CLINIC_OR_DEPARTMENT_OTHER): Payer: Self-pay | Admitting: *Deleted

## 2018-05-17 ENCOUNTER — Other Ambulatory Visit: Payer: Self-pay

## 2018-05-17 ENCOUNTER — Emergency Department (HOSPITAL_BASED_OUTPATIENT_CLINIC_OR_DEPARTMENT_OTHER)
Admission: EM | Admit: 2018-05-17 | Discharge: 2018-05-17 | Disposition: A | Discharge status: Home / Self Care | Payer: Medicare Other | Attending: Emergency Medicine | Admitting: Emergency Medicine

## 2018-05-17 ENCOUNTER — Emergency Department (HOSPITAL_BASED_OUTPATIENT_CLINIC_OR_DEPARTMENT_OTHER): Payer: Medicare Other

## 2018-05-17 DIAGNOSIS — I10 Essential (primary) hypertension: Secondary | ICD-10-CM | POA: Insufficient documentation

## 2018-05-17 DIAGNOSIS — R079 Chest pain, unspecified: Secondary | ICD-10-CM | POA: Diagnosis not present

## 2018-05-17 DIAGNOSIS — Z7982 Long term (current) use of aspirin: Secondary | ICD-10-CM | POA: Insufficient documentation

## 2018-05-17 DIAGNOSIS — Z79899 Other long term (current) drug therapy: Secondary | ICD-10-CM | POA: Insufficient documentation

## 2018-05-17 DIAGNOSIS — E039 Hypothyroidism, unspecified: Secondary | ICD-10-CM | POA: Diagnosis not present

## 2018-05-17 DIAGNOSIS — R0789 Other chest pain: Secondary | ICD-10-CM | POA: Diagnosis not present

## 2018-05-17 LAB — CBC WITH DIFFERENTIAL/PLATELET
Abs Immature Granulocytes: 0.02 10*3/uL (ref 0.00–0.07)
BASOS ABS: 0.1 10*3/uL (ref 0.0–0.1)
Basophils Relative: 1 %
EOS PCT: 2 %
Eosinophils Absolute: 0.1 10*3/uL (ref 0.0–0.5)
HEMATOCRIT: 37.5 % (ref 36.0–46.0)
HEMOGLOBIN: 12.4 g/dL (ref 12.0–15.0)
Immature Granulocytes: 0 %
LYMPHS ABS: 2.1 10*3/uL (ref 0.7–4.0)
LYMPHS PCT: 28 %
MCH: 29.6 pg (ref 26.0–34.0)
MCHC: 33.1 g/dL (ref 30.0–36.0)
MCV: 89.5 fL (ref 80.0–100.0)
MONO ABS: 0.7 10*3/uL (ref 0.1–1.0)
MONOS PCT: 9 %
Neutro Abs: 4.7 10*3/uL (ref 1.7–7.7)
Neutrophils Relative %: 60 %
Platelets: 268 10*3/uL (ref 150–400)
RBC: 4.19 MIL/uL (ref 3.87–5.11)
RDW: 12.8 % (ref 11.5–15.5)
WBC: 7.7 10*3/uL (ref 4.0–10.5)
nRBC: 0 % (ref 0.0–0.2)

## 2018-05-17 LAB — BASIC METABOLIC PANEL
Anion gap: 11 (ref 5–15)
BUN: 18 mg/dL (ref 8–23)
CO2: 24 mmol/L (ref 22–32)
CREATININE: 1.1 mg/dL — AB (ref 0.44–1.00)
Calcium: 9.8 mg/dL (ref 8.9–10.3)
Chloride: 103 mmol/L (ref 98–111)
GFR calc Af Amer: 56 mL/min — ABNORMAL LOW (ref >60)
GFR, EST NON AFRICAN AMERICAN: 48 mL/min — AB (ref >60)
GLUCOSE: 109 mg/dL — AB (ref 70–99)
Potassium: 3.6 mmol/L (ref 3.5–5.1)
Sodium: 138 mmol/L (ref 135–145)

## 2018-05-17 LAB — TROPONIN I
Troponin I: <0.03 ng/mL (ref <0.03)
Troponin I: <0.03 ng/mL (ref <0.03)

## 2018-05-17 MED ORDER — GI COCKTAIL ~~LOC~~
30.0000 mL | Freq: Once | ORAL | Status: AC
  Administered 2018-05-17: 30 mL via ORAL
  Filled 2018-05-17: qty 30

## 2018-05-17 MED ORDER — ONDANSETRON HCL 4 MG/2ML IJ SOLN
4.0000 mg | Freq: Once | INTRAMUSCULAR | Status: AC
  Administered 2018-05-17: 4 mg via INTRAVENOUS
  Filled 2018-05-17: qty 2

## 2018-05-17 NOTE — ED Triage Notes (Signed)
Pt reports nonradiating left sided CP that started tonight. Reports nausea without vomiting. Reports slight SOB, no tachypnea noted. No diaphoresis

## 2018-05-17 NOTE — ED Provider Notes (Signed)
[facility] Provider Note: Georgena Spurling, MD, FACEP  CSN: [id number] MRN: [id number] ARRIVAL: 05/17/18 at [place]: [id number]   CHIEF COMPLAINT  Chest Pain   HISTORY OF PRESENT ILLNESS  05/17/18 3:48 AM [name] is a 72 y.o. female who developed left upper chest pain about 7 PM yesterday evening after eating dinner.  The pain has waxed and waned and is mild presently.  Nothing makes the pain better or worse.  She has difficulty characterizing the pain but it is primarily dull in nature.  She denies associated shortness of breath but has had nausea without vomiting.  She denies diaphoresis.  She has had similar pain in the past that she did not have evaluated.   Past Medical History:  Diagnosis Date  . Allergy    seasonal  . Ankylosing spondylitis ([place])   . Anxiety   . Arthritis   . Bell palsy   . CTS (carpal tunnel syndrome)   . Cystitis   . Diverticulitis   . Diverticulosis of colon   . Dry eye syndrome   . Frozen shoulder   . GERD (gastroesophageal reflux disease)   . HLA B27 (HLA B27 positive)   . Hx of colonic polyps   . Hyperlipidemia    NMR 2005  . Hypertension   . Hypothyroidism   . Incontinence   . Internal hemorrhoids   . Interstitial cystitis   . Kidney stone   . Menopause   . Osteoarthritis   . Osteopenia    [facility]  . S/P total hysterectomy and bilateral salpingo-oophorectomy 04/12/2014  . Sinusitis   . Sleep apnea    c-pap  . Subjective visual disturbance of both eyes 12/30/2013  . Tubular adenoma of colon   . Vitamin D deficiency   . Xerostomia    and Xeroophthalmia    Past Surgical History:  Procedure Laterality Date  . ABDOMINAL HYSTERECTOMY    . BLADDER SURGERY     for incontinence  . CARPAL TUNNEL RELEASE Left 12/22/2014   Procedure: LEFT CARPAL TUNNEL RELEASE;  Surgeon: Daryll Brod, MD;  Location: [facility];  Service: Orthopedics;  Laterality: Left;  . COLONOSCOPY W/  POLYPECTOMY     adenomatous poyp 04-2008  . colonscopy     tics 10-2002  . CYSTOSCOPY WITH RETROGRADE PYELOGRAM, URETEROSCOPY AND STENT PLACEMENT Left 09/01/2013   Procedure: CYSTOSCOPY WITH RETROGRADE PYELOGRAM, AND LEFT STENT PLACEMENT;  Surgeon: Molli Hazard, MD;  [facility];  Service: Urology;  Laterality: Left;  . elevated LFTs     due to Elmiron  . FINGER SURGERY Left 2004   2nd finger  . G 1 P 1    . interstitial cystitis     with clot in catheter  . SHOULDER ARTHROSCOPY Left   . TOTAL ABDOMINAL HYSTERECTOMY W/ BILATERAL SALPINGOOPHORECTOMY     dysfunctional menses    Family History  Problem Relation Age of Onset  . Coronary artery disease Mother   . Hypertension Mother   . Asthma Mother   . Dementia Mother   . Stroke Mother        mini cva  . Diabetes Father   . Cirrhosis Father        non alcoholic  . Cancer Sister        colon  . Colon cancer Sister 65  . Allergic rhinitis Sister   . Allergic rhinitis Sister   . Food Allergy Sister   .  Cancer Paternal Aunt        2 aunts had renal cancer  . Coronary artery disease Maternal Grandfather   . Cancer Maternal Aunt        breast  . Diabetes Paternal Aunt   . Diabetes Paternal Grandmother   . Esophageal cancer Neg Hx   . Stomach cancer Neg Hx     Social History   Tobacco Use  . Smoking status: Never Smoker  . Smokeless tobacco: Never Used  Substance Use Topics  . Alcohol use: Not Currently    Comment:      . Drug use: No    Prior to Admission medications   Medication Sig Start Date End Date Taking? Authorizing Provider  acetaminophen (TYLENOL) 500 MG tablet Take 500 mg by mouth every 4 (four) hours as needed for mild pain.     [provider]  Ascorbic Acid (VITAMIN C) 1000 MG tablet Take 1,000 mg by mouth daily.     [provider]  aspirin 81 MG tablet Take 81 mg by mouth daily.      [provider]  BIOTIN PO Take 1 tablet by mouth daily.    [provider]  blood glucose meter kit and supplies KIT Dispense based on patient and insurance preference. Use up to four times daily as directed. prediabetes 10/28/15   Binnie Rail, MD  diazepam (VALIUM) 5 MG tablet One tab by mouth, 2 hours before procedure. 05/07/18   Lyndal Pulley, DO  Diclofenac Sodium 2 % SOLN Place onto the skin as directed for pain as needed.    [provider]  diphenoxylate-atropine (LOMOTIL) 2.5-0.025 MG tablet Take 1 tablet by mouth 4 (four) times daily as needed for diarrhea or loose stools. 04/08/18   Pyrtle, Lajuan Lines, MD  EPINEPHrine (EPIPEN 2-PAK) 0.3 mg/0.3 mL IJ SOAJ injection Use as directed for severe allergic reactions 04/26/18   Bobbitt, Sedalia Muta, MD  gabapentin (NEURONTIN) 100 MG capsule Take 1 capsule (100 mg total) by mouth at bedtime. Patient taking differently: Take 100 mg by mouth as needed.  01/16/18   Lyndal Pulley, DO  hydrochlorothiazide (MICROZIDE) 12.5 MG capsule Take 1 capsule (12.5 mg total) by mouth daily. 03/22/18   Dorothy Spark, MD  levocetirizine (XYZAL) 5 MG tablet Take 1 tablet (5 mg total) by mouth every evening. 04/18/18   Bobbitt, Sedalia Muta, MD  lipase/protease/amylase (CREON) 36000 UNITS CPEP capsule Take 2 capsules (72,000 Units total) by mouth 3 (three) times daily with meals. 1 capsule with snacks (max 8 capsules daily) 03/12/18   Pyrtle, Lajuan Lines, MD  meloxicam (MOBIC) 15 MG tablet TAKE 1 TABLET (15 MG TOTAL) BY MOUTH DAILY AS NEEDED FOR PAIN. WITH FOOD 04/04/18   Binnie Rail, MD  Olopatadine HCl (PAZEO) 0.7 % SOLN Place 1 drop into both eyes daily. 05/02/18   Valentina Shaggy, MD  Omega-3 Fatty Acids (FISH OIL BURP-LESS) 1000 MG CAPS Takes 1000 mg BID 01/03/16   Esterwood, Amy S, PA-C  omeprazole (PRILOSEC) 40 MG capsule TAKE 1 CAPSULE (40 MG TOTAL) BY MOUTH DAILY. 05/08/18   Pyrtle, Lajuan Lines, MD  orphenadrine (NORFLEX) 100 MG tablet Take 1 tablet (100 mg total) by mouth 2 (two) times daily. 3/55/97   Delora Fuel, MD   Pancrelipase, Lip-Prot-Amyl, (ZENPEP) [phone] units CPEP Take 2-3 capsules by mouth 3 (three) times daily with meals. 1 capsule with each snack 04/08/18   Pyrtle, Lajuan Lines, MD  Polyethyl Glycol-Propyl Glycol (  SYSTANE OP) Apply 1 drop to eye as needed. Apply to both eyes as needed    [provider]  rosuvastatin (CRESTOR) 5 MG tablet Take 1 tablet (5 mg total) by mouth daily. 05/11/17 04/18/18  Dorothy Spark, MD  vitamin B-12 (CYANOCOBALAMIN) 1000 MCG tablet Take 1,000 mcg by mouth daily.     [provider]  vitamin E 400 UNIT capsule Take 400 Units by mouth daily.    [provider]    Allergies Nitroglycerin; Pentosan polysulfate sodium; Sulfamethoxazole-trimethoprim; Bactrim [sulfamethoxazole-trimethoprim]; Promethazine hcl; and Lisinopril   REVIEW OF SYSTEMS  Negative except as noted here or in the History of Present Illness.   PHYSICAL EXAMINATION  Initial Vital Signs Blood pressure 134/77, pulse 80, temperature 97.8 F (36.6 C), temperature source Oral, resp. rate 19, height 5' 2"  (1.575 m), weight 73.5 kg, SpO2 97 %.  Examination General: Well-developed, well-nourished female in no acute distress; appearance consistent with age of record HENT: normocephalic; atraumatic Eyes: pupils equal, round and reactive to light; extraocular muscles intact Neck: supple Heart: regular rate and rhythm; no murmur Lungs: clear to auscultation bilaterally Abdomen: soft; nondistended; nontender; bowel sounds present Extremities: No deformity; full range of motion; pulses normal Neurologic: Awake, alert and oriented; motor function intact in all extremities and symmetric; no facial droop Skin: Warm and dry Psychiatric: Normal mood and affect   RESULTS  Summary of this visit's results, reviewed by myself:   EKG Interpretation  Date/Time:  Friday May 17 2018 01:22:54 EDT Ventricular Rate:  77 PR Interval:  144 QRS Duration: 86 QT Interval:  388 QTC  Calculation: 439 R Axis:   -71 Text Interpretation:  Normal sinus rhythm Left anterior fascicular block Possible Anterior infarct , age undetermined Abnormal ECG No significant change was found Confirmed by Shanon Rosser [phone]) on 05/17/2018 1:36:07 AM      Laboratory Studies: Results for orders placed or performed during the hospital encounter of 05/17/18 (from the past 24 hour(s))  Basic metabolic panel     Status: Abnormal   Collection Time: 05/17/18  1:27 AM  Result Value Ref Range   Sodium 138 135 - 145 mmol/L   Potassium 3.6 3.5 - 5.1 mmol/L   Chloride 103 98 - 111 mmol/L   CO2 24 22 - 32 mmol/L   Glucose, Bld 109 (H) 70 - 99 mg/dL   BUN 18 8 - 23 mg/dL   Creatinine, Ser 1.10 (H) 0.44 - 1.00 mg/dL   Calcium 9.8 8.9 - 10.3 mg/dL   GFR calc non Af Amer 48 (L) >60 mL/min   GFR calc Af Amer 56 (L) >60 mL/min   Anion gap 11 5 - 15  Troponin I     Status: None   Collection Time: 05/17/18  1:27 AM  Result Value Ref Range   Troponin I <0.03 <0.03 ng/mL  CBC with Differential     Status: None   Collection Time: 05/17/18  1:27 AM  Result Value Ref Range   WBC 7.7 4.0 - 10.5 K/uL   RBC 4.19 3.87 - 5.11 MIL/uL   Hemoglobin 12.4 12.0 - 15.0 g/dL   HCT 37.5 36.0 - 46.0 %   MCV 89.5 80.0 - 100.0 fL   MCH 29.6 26.0 - 34.0 pg   MCHC 33.1 30.0 - 36.0 g/dL   RDW 12.8 11.5 - 15.5 %   Platelets 268 150 - 400 K/uL   nRBC 0.0 0.0 - 0.2 %   Neutrophils Relative % 60 %  Neutro Abs 4.7 1.7 - 7.7 K/uL   Lymphocytes Relative 28 %   Lymphs Abs 2.1 0.7 - 4.0 K/uL   Monocytes Relative 9 %   Monocytes Absolute 0.7 0.1 - 1.0 K/uL   Eosinophils Relative 2 %   Eosinophils Absolute 0.1 0.0 - 0.5 K/uL   Basophils Relative 1 %   Basophils Absolute 0.1 0.0 - 0.1 K/uL   Immature Granulocytes 0 %   Abs Immature Granulocytes 0.02 0.00 - 0.07 K/uL  Troponin I     Status: None   Collection Time: 05/17/18  4:23 AM  Result Value Ref Range   Troponin I <0.03 <0.03 ng/mL   Imaging Studies: Dg Chest 2  View  Result Date: 05/17/2018 CLINICAL DATA:  Chest pain EXAM: CHEST - 2 VIEW COMPARISON:  CT 05/20/2017, radiograph 10/12/2016 FINDINGS: The heart size and mediastinal contours are within normal limits. Both lungs are clear. Degenerative changes of the spine. IMPRESSION: No active cardiopulmonary disease. Electronically Signed   By: Donavan Foil M.D.   On: 05/17/2018 01:55    ED COURSE and MDM  Nursing notes and initial vitals signs, including pulse oximetry, reviewed.  Vitals:   05/17/18 0230 05/17/18 0300 05/17/18 0332 05/17/18 0418  BP: 125/71 134/77 (!) 146/79 (!) 150/89  Pulse: 66 80  72  Resp: 15 19  (!) 25  Temp:      TempSrc:      SpO2: 94% 97%  97%  Weight:      Height:       5:02 AM Asymptomatic after GI cocktail.  Troponin negative x2.  EKG reassuring.  Patient advised to follow-up with her primary care physician or to return to the ED if symptoms return.  PROCEDURES    ED DIAGNOSES     ICD-10-CM   1. Atypical chest pain R07.89        Shanon Rosser, MD 05/17/18 [phone]

## 2018-05-20 NOTE — Progress Notes (Signed)
Subjective:    Patient ID: [name], female    DOB: May 27, 1946, 72 y.o.   MRN: [id number]  HPI The patient is here for follow up from the hospital..  She went to the ED 10/18 for chest pain.  She stated that the chest pain was in her left upper chest and started around 7:00 the evening before.  It started after she ate dinner.  The pain waxed and waned and was mild when she got to the emergency room.  Nothing made the pain better or worse.  She stated the pain was dull in nature.  She denied shortness of breath and diaphoresis.  She did have some nausea.  Her vital signs were stable in the ED and her exam was normal.  EKG showed normal sinus rhythm, left anterior fascicular block, possible anterior infarct-stable compared to prior EKGs.  Chest x-ray was normal.  Blood work was unremarkable.  Troponin x2 was negative.  She did receive a GI cocktail, which relieved her symptoms.  She had similar symptoms the week prior.  This past Sunday, two days ago, she had severe pain in her epigastric region prior to eating in the morning.  She was not sure what to take, so initially did not take anything.  Later that day she tried pepcid and tried a couple of other things later, including pepto bismol.  She had intermittent epigastric pain yesterday.  She has not felt it today.  She is unsure if eating made it worse.  She states it felt like a slow twisting of her stomach. She states her bowels have been normal.   She denies GERD symptoms - her gerd in controlled.  She has been on the Creon for a few months.  She did start to have some diarrhea on Monday and restarted the Creon.  She has an appointment with GI in one month.     Medications and allergies reviewed with patient and updated if appropriate.  Patient Active Problem List   Diagnosis Date Noted  . Baker's cyst of knee, right 05/07/2018  . Perennial and seasonal allergic rhinitis 04/18/2018  . History of food allergy 04/18/2018  . Blister of  great toe of right foot 03/04/2018  . Onychomycosis of toenail 03/04/2018  . Hypercalcemia 09/17/2017  . Left wrist pain 09/17/2017  . Synovitis of left knee 05/22/2017  . Diabetes ([place]) 05/02/2017  . Trigger point of left shoulder region 03/27/2017  . Taste disorder 02/22/2017  . Anxiety 02/12/2017  . Caregiver with fatigue 01/17/2017  . Psychosocial stressors 01/17/2017  . Fatigue 01/15/2017  . Decreased sense of taste 01/15/2017  . At high risk for falls 11/16/2016  . Family history of osteoporosis 11/16/2016  . Atherosclerotic plaque 11/07/2016  . 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 Renal insufficiency 06/29/2016  . Osteoporosis 06/21/2016  . Dizziness 05/16/2016  . Arthritis of left shoulder region 01/17/2016  . Ankylosing spondylitis ([place]) 11/08/2015  . Incontinence 10/28/2015  . Thoracic back pain 10/19/2015  . Leg cramping 09/29/2015  . 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 Impingement syndrome of left shoulder region 09/22/2014  . Interstitial cystitis 07/09/2014  . Diverticulosis of colon without hemorrhage 07/08/2014  . Carpal tunnel syndrome of left wrist 04/12/2014  . Food allergy 03/06/2014  . Renal calculi 01/30/2014  . OSA (obstructive sleep apnea), moderate 01/14/2014  . Allergic conjunctivitis 12/06/2013  . History of Bell's palsy 02/26/2012  . Vitamin D deficiency  06/27/2010  . 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  . DRY MOUTH 06/27/2010  . ABDOMINAL PAIN, SUPRAPUBIC 05/25/2009  . Gastroesophageal reflux disease 04/30/2009  . History of colonic polyps 04/30/2009  . COSTOCHONDRITIS 01/15/2008  . HYPERLIPIDEMIA 08/21/2007  . Essential hypertension 08/21/2007  . ARTHRALGIA 08/21/2007  . Chronic lumbar radiculopathy 01/25/2007    Current Outpatient Medications on File Prior to Visit  Medication Sig Dispense Refill  . acetaminophen (TYLENOL) 500 MG tablet Take 500 mg by mouth every 4 (four) hours as  needed for mild pain.     . Ascorbic Acid (VITAMIN C) 1000 MG tablet Take 1,000 mg by mouth daily.     Marland Kitchen aspirin 81 MG tablet Take 81 mg by mouth daily.      Marland Kitchen BIOTIN PO Take 1 tablet by mouth daily.    . blood glucose meter kit and supplies KIT Dispense based on patient and insurance preference. Use up to four times daily as directed. prediabetes 1 each 0  . Diclofenac Sodium 2 % SOLN Place onto the skin as directed for pain as needed.    . diphenoxylate-atropine (LOMOTIL) 2.5-0.025 MG tablet Take 1 tablet by mouth 4 (four) times daily as needed for diarrhea or loose stools. 90 tablet 2  . EPINEPHrine (EPIPEN 2-PAK) 0.3 mg/0.3 mL IJ SOAJ injection Use as directed for severe allergic reactions 2 Device 2  . gabapentin (NEURONTIN) 100 MG capsule Take 1 capsule (100 mg total) by mouth at bedtime. (Patient taking differently: Take 100 mg by mouth as needed. ) 30 capsule 3  . hydrochlorothiazide (MICROZIDE) 12.5 MG capsule Take 1 capsule (12.5 mg total) by mouth daily. 90 capsule 1  . levocetirizine (XYZAL) 5 MG tablet Take 1 tablet (5 mg total) by mouth every evening. 30 tablet 5  . lipase/protease/amylase (CREON) 36000 UNITS CPEP capsule Take 2 capsules (72,000 Units total) by mouth 3 (three) times daily with meals. 1 capsule with snacks (max 8 capsules daily) 240 capsule 0  . meloxicam (MOBIC) 15 MG tablet TAKE 1 TABLET (15 MG TOTAL) BY MOUTH DAILY AS NEEDED FOR PAIN. WITH FOOD 90 tablet 2  . Olopatadine HCl (PAZEO) 0.7 % SOLN Place 1 drop into both eyes daily. 1 Bottle 5  . Omega-3 Fatty Acids (FISH OIL BURP-LESS) 1000 MG CAPS Takes 1000 mg BID 60 capsule 0  . omeprazole (PRILOSEC) 40 MG capsule TAKE 1 CAPSULE (40 MG TOTAL) BY MOUTH DAILY. 30 capsule 5  . orphenadrine (NORFLEX) 100 MG tablet Take 1 tablet (100 mg total) by mouth 2 (two) times daily. 30 tablet 0  . Pancrelipase, Lip-Prot-Amyl, (ZENPEP) 40000-126000 units CPEP Take 2-3 capsules by mouth 3 (three) times daily with meals. 1 capsule with  each snack 84 capsule 0  . Polyethyl Glycol-Propyl Glycol (SYSTANE OP) Apply 1 drop to eye as needed. Apply to both eyes as needed    . vitamin B-12 (CYANOCOBALAMIN) 1000 MCG tablet Take 1,000 mcg by mouth daily.     . vitamin E 400 UNIT capsule Take 400 Units by mouth daily.    . rosuvastatin (CRESTOR) 5 MG tablet Take 1 tablet (5 mg total) by mouth daily. 90 tablet 2   No current facility-administered medications on file prior to visit.     Past Medical History:  Diagnosis Date  . Allergy    seasonal  . Ankylosing spondylitis ([place])   . Anxiety   . Arthritis   . Bell palsy   . CTS (carpal tunnel syndrome)   . Cystitis   .  Diverticulitis   . Diverticulosis of colon   . Dry eye syndrome   . Frozen shoulder   . GERD (gastroesophageal reflux disease)   . HLA B27 (HLA B27 positive)   . Hx of colonic polyps   . Hyperlipidemia    NMR 2005  . Hypertension   . Hypothyroidism   . Incontinence   . Internal hemorrhoids   . Interstitial cystitis   . Kidney stone   . Menopause   . Osteoarthritis   . Osteopenia    [facility]MD done [facility]  . S/P total hysterectomy and bilateral salpingo-oophorectomy 04/12/2014  . Sinusitis   . Sleep apnea    c-pap  . Subjective visual disturbance of both eyes 12/30/2013  . Tubular adenoma of colon   . Vitamin D deficiency   . Xerostomia    and Xeroophthalmia    Past Surgical History:  Procedure Laterality Date  . ABDOMINAL HYSTERECTOMY    . BLADDER SURGERY     for incontinence  . CARPAL TUNNEL RELEASE Left 12/22/2014   Procedure: LEFT CARPAL TUNNEL RELEASE;  Surgeon: Daryll Brod, MD;  Location: [facility];  Service: Orthopedics;  Laterality: Left;  . COLONOSCOPY W/ POLYPECTOMY     adenomatous poyp 04-2008  . colonscopy     tics 10-2002  . CYSTOSCOPY WITH RETROGRADE PYELOGRAM, URETEROSCOPY AND STENT PLACEMENT Left 09/01/2013   Procedure: CYSTOSCOPY WITH RETROGRADE PYELOGRAM, AND LEFT STENT PLACEMENT;  Surgeon: Molli Hazard, MD;  Location: [facility];  Service: Urology;  Laterality: Left;  . elevated LFTs     due to Elmiron  . FINGER SURGERY Left 2004   2nd finger  . G 1 P 1    . interstitial cystitis     with clot in catheter  . SHOULDER ARTHROSCOPY Left   . TOTAL ABDOMINAL HYSTERECTOMY W/ BILATERAL SALPINGOOPHORECTOMY     dysfunctional menses    Social History   Socioeconomic History  . Marital status: Married    Spouse name: Not on file  . Number of children: 1  . Years of education: BS  . Highest education level: Not on file  Occupational History  . Occupation: retired  . Occupation: Pharmacist, hospital    Comment: Kindergarten  Social Needs  . Financial resource strain: Not hard at all  . Food insecurity:    Worry: Never true    Inability: Never true  . Transportation needs:    Medical: No    Non-medical: No  Tobacco Use  . Smoking status: Never Smoker  . Smokeless tobacco: Never Used  Substance and Sexual Activity  . Alcohol use: Not Currently    Comment:      . Drug use: No  . Sexual activity: Not Currently  Lifestyle  . Physical activity:    Days per week: 0 days    Minutes per session: 0 min  . Stress: Rather much  Relationships  . Social connections:    Talks on phone: More than three times a week    Gets together: More than three times a week    Attends religious service: Not on file    Active member of club or organization: Not on file    Attends meetings of clubs or organizations: Not on file    Relationship status: Married  Other Topics Concern  . Not on file  Social History Narrative   Lives   Caffeine use:    Married       Family History  Problem Relation Age of Onset  . Coronary artery disease Mother   . Hypertension Mother   . Asthma Mother   . Dementia Mother   . Stroke Mother        mini cva  . Diabetes Father   . Cirrhosis Father        non alcoholic  . Cancer Sister        colon  . Colon cancer Sister 41  . Allergic rhinitis Sister   .  Allergic rhinitis Sister   . Food Allergy Sister   . Cancer Paternal Aunt        2 aunts had renal cancer  . Coronary artery disease Maternal Grandfather   . Cancer Maternal Aunt        breast  . Diabetes Paternal Aunt   . Diabetes Paternal Grandmother   . Esophageal cancer Neg Hx   . Stomach cancer Neg Hx     Review of Systems  Constitutional: Negative for chills and fever.  Respiratory: Negative for shortness of breath.   Cardiovascular: Negative for chest pain, palpitations and leg swelling.  Gastrointestinal: Positive for abdominal pain (epigastric pain intermittently), diarrhea (a little on Monday -restarted creon) and nausea (intermittently). Negative for constipation.       No gerd/reflux symptoms  Neurological: Negative for light-headedness and headaches.       Objective:   Vitals:   05/21/18 1259  BP: 130/72  Pulse: 77  Resp: 16  Temp: 98.5 F (36.9 C)  SpO2: 97%   BP Readings from Last 3 Encounters:  05/21/18 130/72  05/17/18 133/79  05/07/18 128/88   Wt Readings from Last 3 Encounters:  05/21/18 157 lb 12.8 oz (71.6 kg)  05/17/18 162 lb (73.5 kg)  05/07/18 162 lb (73.5 kg)   Body mass index is 28.86 kg/m.   Physical Exam    Constitutional: Appears well-developed and well-nourished. No distress.  HENT:  Head: Normocephalic and atraumatic.  Neck: Neck supple. No tracheal deviation present. No thyromegaly present.  No cervical lymphadenopathy Cardiovascular: Normal rate, regular rhythm and normal heart sounds.   No murmur heard.  No edema Pulmonary/Chest: Effort normal and breath sounds normal. No respiratory distress. No has no wheezes. No rales.  Abdomen:  Soft, mild tenderness in epigastric region w/o rebound or guarding, ND Skin: Skin is warm and dry. Not diaphoretic.  Psychiatric: Normal mood and affect. Behavior is normal.      Assessment & Plan:    See Problem List for Assessment and Plan of chronic medical problems.

## 2018-05-21 ENCOUNTER — Encounter: Payer: Self-pay | Admitting: Internal Medicine

## 2018-05-21 ENCOUNTER — Ambulatory Visit (INDEPENDENT_AMBULATORY_CARE_PROVIDER_SITE_OTHER): Payer: Medicare Other | Admitting: Internal Medicine

## 2018-05-21 VITALS — BP 130/72 | HR 77 | Temp 98.5°F | Resp 16 | Ht 62.0 in | Wt 157.8 lb

## 2018-05-21 DIAGNOSIS — R1013 Epigastric pain: Secondary | ICD-10-CM | POA: Diagnosis not present

## 2018-05-21 DIAGNOSIS — R0789 Other chest pain: Secondary | ICD-10-CM | POA: Diagnosis not present

## 2018-05-21 DIAGNOSIS — I2510 Atherosclerotic heart disease of native coronary artery without angina pectoris: Secondary | ICD-10-CM | POA: Diagnosis not present

## 2018-05-21 NOTE — Assessment & Plan Note (Signed)
Went to ED 10/18 and cardiac w/u negative Symptoms resolved after GI cocktail Symptoms may have been GI in nature or less likely musculoskeletal -  She has chronic left shoulder pain that sometimes radiates to her chest No similar symptoms since Has GI appt next month Has cardio appt in two months She will let me know if she has similar symptoms again

## 2018-05-21 NOTE — Assessment & Plan Note (Signed)
She started having epigastric pain two days ago - it has resolved at this time Possible spasm, she had some nausea and started to have diarrhea Denies GERD Restarted Creon, which she will continue until she see GI next month Advised to take pepcid as needed, can also try mylanta She will monitor her recurrent symptoms and discuss with GI next month, sooner if symptoms worsen

## 2018-05-21 NOTE — Patient Instructions (Signed)
Try taking mylanta, pepcid or tums as needed.  Continue to monitor your stomach symptoms.  If they get worse contact GI.   Call with any questions

## 2018-06-03 DIAGNOSIS — G4733 Obstructive sleep apnea (adult) (pediatric): Secondary | ICD-10-CM | POA: Diagnosis not present

## 2018-06-04 ENCOUNTER — Ambulatory Visit: Payer: Self-pay | Admitting: Family Medicine

## 2018-06-13 ENCOUNTER — Ambulatory Visit (INDEPENDENT_AMBULATORY_CARE_PROVIDER_SITE_OTHER): Payer: Medicare Other | Admitting: Family Medicine

## 2018-06-13 VITALS — BP 118/86 | HR 75 | Ht 62.0 in | Wt 157.0 lb

## 2018-06-13 DIAGNOSIS — M5416 Radiculopathy, lumbar region: Secondary | ICD-10-CM | POA: Diagnosis not present

## 2018-06-13 DIAGNOSIS — I2510 Atherosclerotic heart disease of native coronary artery without angina pectoris: Secondary | ICD-10-CM

## 2018-06-13 DIAGNOSIS — M48062 Spinal stenosis, lumbar region with neurogenic claudication: Secondary | ICD-10-CM

## 2018-06-13 NOTE — Progress Notes (Signed)
[name] [place], [place] [address] Phone: [phone] Subjective:    I'm seeing this patient by the request  of:    CC: Back pain follow-up  [id number]  [name] is a 72 y.o. female coming in with complaint of back pain. Patient is here for MRI results. Pain has improved but does get twinges of pain she states.  Patient's MRI did show that patient did have severe spinal stenosis at L4-L5 and other arthritic changes.  Patient states that the pain is still unrelenting.  History of ankylosing spondylitis.  No significant erosive changes of the back noted.  Patient states that she feels like she has had pain all the time and the back pain is stopping her from activity.  Can even wake her up at night      Past Medical History:  Diagnosis Date  . Allergy    seasonal  . Ankylosing spondylitis ([place])   . Anxiety   . Arthritis   . Bell palsy   . CTS (carpal tunnel syndrome)   . Cystitis   . Diverticulitis   . Diverticulosis of colon   . Dry eye syndrome   . Frozen shoulder   . GERD (gastroesophageal reflux disease)   . HLA B27 (HLA B27 positive)   . Hx of colonic polyps   . Hyperlipidemia    NMR 2005  . Hypertension   . Hypothyroidism   . Incontinence   . Internal hemorrhoids   . Interstitial cystitis   . Kidney stone   . Menopause   . Osteoarthritis   . Osteopenia    [facility]MD done [facility]  . S/P total hysterectomy and bilateral salpingo-oophorectomy 04/12/2014  . Sinusitis   . Sleep apnea    c-pap  . Subjective visual disturbance of both eyes 12/30/2013  . Tubular adenoma of colon   . Vitamin D deficiency   . Xerostomia    and Xeroophthalmia   Past Surgical History:  Procedure Laterality Date  . ABDOMINAL HYSTERECTOMY    . BLADDER SURGERY     for incontinence  . CARPAL TUNNEL RELEASE Left 12/22/2014   Procedure: LEFT CARPAL TUNNEL RELEASE;  Surgeon: Daryll Brod, MD;  Location: [facility];  Service: Orthopedics;  Laterality: Left;  . COLONOSCOPY W/ POLYPECTOMY     adenomatous poyp 04-2008  . colonscopy     tics 10-2002  . CYSTOSCOPY WITH RETROGRADE PYELOGRAM, URETEROSCOPY AND STENT PLACEMENT Left 09/01/2013   Procedure: CYSTOSCOPY WITH RETROGRADE PYELOGRAM, AND LEFT STENT PLACEMENT;  Surgeon: Molli Hazard, MD;  [facility];  Service: Urology;  Laterality: Left;  . elevated LFTs     due to Elmiron  . FINGER SURGERY Left 2004   2nd finger  . G 1 P 1    . interstitial cystitis     with clot in catheter  . SHOULDER ARTHROSCOPY Left   . TOTAL ABDOMINAL HYSTERECTOMY W/ BILATERAL SALPINGOOPHORECTOMY     dysfunctional menses   Social History   Socioeconomic History  . Marital status: Married    Spouse name: Not on file  . Number of children: 1  . Years of education: BS  . Highest education level: Not on file  Occupational History  . Occupation: retired  . Occupation: Pharmacist, hospital    Comment: Kindergarten  Social Needs  . Financial resource strain: Not hard at all  . Food insecurity:    Worry: Never true  Inability: Never true  . Transportation needs:    Medical: No    Non-medical: No  Tobacco Use  . Smoking status: Never Smoker  . Smokeless tobacco: Never Used  Substance and Sexual Activity  . Alcohol use: Not Currently    Comment:      . Drug use: No  . Sexual activity: Not Currently  Lifestyle  . Physical activity:    Days per week: 0 days    Minutes per session: 0 min  . Stress: Rather much  Relationships  . Social connections:    Talks on phone: More than three times a week    Gets together: More than three times a week    Attends religious service: Not on file    Active member of club or organization: Not on file    Attends meetings of clubs or organizations: Not on file    Relationship status: Married  Other Topics Concern  . Not on file  Social History Narrative   Lives   Caffeine use:    Married      Allergies    Allergen Reactions  . Nitroglycerin Anaphylaxis    Hypotensive after NTG administration in ER during chest pain evaluation Pt states she almost died,  Pt states in a procedure done in 10/2016 she had no reaction to this medication, so not sure if it is an actual allergy.   . Pentosan Polysulfate Sodium     Elevated LFTs.......Marland Kitchen elmiron   . Sulfamethoxazole-Trimethoprim Nausea Only    Unknown reaction per pt, Pt thinks she may have felt sick to her stomach.   . Bactrim [Sulfamethoxazole-Trimethoprim]   . Promethazine Hcl     "jittery on the inside"  . Lisinopril Cough   Family History  Problem Relation Age of Onset  . Coronary artery disease Mother   . Hypertension Mother   . Asthma Mother   . Dementia Mother   . Stroke Mother        mini cva  . Diabetes Father   . Cirrhosis Father        non alcoholic  . Cancer Sister        colon  . Colon cancer Sister 84  . Allergic rhinitis Sister   . Allergic rhinitis Sister   . Food Allergy Sister   . Cancer Paternal Aunt        2 aunts had renal cancer  . Coronary artery disease Maternal Grandfather   . Cancer Maternal Aunt        breast  . Diabetes Paternal Aunt   . Diabetes Paternal Grandmother   . Esophageal cancer Neg Hx   . Stomach cancer Neg Hx      Current Outpatient Medications (Cardiovascular):  Marland Kitchen  EPINEPHrine (EPIPEN 2-PAK) 0.3 mg/0.3 mL IJ SOAJ injection, Use as directed for severe allergic reactions .  hydrochlorothiazide (MICROZIDE) 12.5 MG capsule, Take 1 capsule (12.5 mg total) by mouth daily. .  rosuvastatin (CRESTOR) 5 MG tablet, Take 1 tablet (5 mg total) by mouth daily.  Current Outpatient Medications (Respiratory):  .  cetirizine (ZYRTEC) 10 MG tablet, Take 10 mg by mouth daily.  Current Outpatient Medications (Analgesics):  .  acetaminophen (TYLENOL) 500 MG tablet, Take 500 mg by mouth every 4 (four) hours as needed for mild pain.  Marland Kitchen  aspirin 81 MG tablet, Take 81 mg by mouth daily.   .  meloxicam  (MOBIC) 15 MG tablet, TAKE 1 TABLET (15 MG TOTAL) BY MOUTH DAILY AS NEEDED FOR  PAIN. WITH FOOD  Current Outpatient Medications (Hematological):  .  vitamin B-12 (CYANOCOBALAMIN) 1000 MCG tablet, Take 1,000 mcg by mouth daily.   Current Outpatient Medications (Other):  Marland Kitchen  Ascorbic Acid (VITAMIN C) 1000 MG tablet, Take 1,000 mg by mouth daily.  Marland Kitchen  BIOTIN PO, Take 1 tablet by mouth daily. .  blood glucose meter kit and supplies KIT, Dispense based on patient and insurance preference. Use up to four times daily as directed. prediabetes .  Diclofenac Sodium 2 % SOLN, Place onto the skin as directed for pain as needed. .  diphenoxylate-atropine (LOMOTIL) 2.5-0.025 MG tablet, Take 1 tablet by mouth 4 (four) times daily as needed for diarrhea or loose stools. .  gabapentin (NEURONTIN) 100 MG capsule, Take 1 capsule (100 mg total) by mouth at bedtime. (Patient taking differently: Take 100 mg by mouth as needed. ) .  lipase/protease/amylase (CREON) 36000 UNITS CPEP capsule, Take 2 capsules (72,000 Units total) by mouth 3 (three) times daily with meals. 1 capsule with snacks (max 8 capsules daily) .  Olopatadine HCl (PAZEO) 0.7 % SOLN, Place 1 drop into both eyes daily. .  Omega-3 Fatty Acids (FISH OIL BURP-LESS) 1000 MG CAPS, Takes 1000 mg BID .  omeprazole (PRILOSEC) 40 MG capsule, TAKE 1 CAPSULE (40 MG TOTAL) BY MOUTH DAILY. .  orphenadrine (NORFLEX) 100 MG tablet, Take 1 tablet (100 mg total) by mouth 2 (two) times daily. .  Pancrelipase, Lip-Prot-Amyl, (ZENPEP) 40000-126000 units CPEP, Take 2-3 capsules by mouth 3 (three) times daily with meals. 1 capsule with each snack .  Polyethyl Glycol-Propyl Glycol (SYSTANE OP), Apply 1 drop to eye as needed. Apply to both eyes as needed .  vitamin E 400 UNIT capsule, Take 400 Units by mouth daily.    Past medical history, social, surgical and family history all reviewed in electronic medical record.  No pertanent information unless stated regarding to the  chief complaint.   Review of Systems:  No headache, visual changes, nausea, vomiting, diarrhea, constipation, dizziness, abdominal pain, skin rash, fevers, chills, night sweats, weight loss, swollen lymph nodes, joint swelling, chest pain, shortness of breath, mood changes.  Positive muscle aches, body aches  Objective  Blood pressure 118/86, pulse 75, height _0  (1.575 m), weight 157 lb (71.2 kg), SpO2 98 %.    General: No apparent distress alert and oriented x3 mood and affect normal, dressed appropriately.  HEENT: Pupils equal, extraocular movements intact  Respiratory: Patient's speak in full sentences and does not appear short of breath  Cardiovascular: No lower extremity edema, non tender, no erythema  Skin: Warm dry intact with no signs of infection or rash on extremities or on axial skeleton.  Abdomen: Soft nontender  Neuro: Cranial nerves II through XII are intact, neurovascularly intact in all extremities with 2+ DTRs and 2+ pulses.  Lymph: No lymphadenopathy of posterior or anterior cervical chain or axillae bilaterally.  Gait antalgic MSK:  tender with minimal range of motion and good stability and symmetric strength and tone of  elbows, wrist, hip, knee and ankles bilaterally.   Severe arthritic changes of the left shoulder still noted.  Patient's back exam shows the patient does have some loss of lordosis and degenerative scoliosis of the lumbar spine.  In some mild increase in kyphosis of the upper thoracic spine.  Diffusely tender to palpation in the lower extremities.  Severe tenderness over the piriformis is bilaterally in the greater trochanteric area.  Worsening radicular symptoms with straight leg test at 25  degrees bilaterally of the legs.  Worsening pain in the back with any extension greater than 5 degrees.  Strength is 4 out of 5 but symmetric of the lower extremities.  Deep tendon reflexes appear to be intact   Impression and Recommendations:     This case  required medical decision making of moderate complexity. The above documentation has been reviewed and is accurate and complete Lyndal Pulley, DO       Note: This dictation was prepared with Dragon dictation along with smaller phrase technology. Any transcriptional errors that result from this process are unintentional.

## 2018-06-13 NOTE — Patient Instructions (Addendum)
Good to see you  [name] is your friend We ordered the epidural and call [phone] and schedule it  For the shoulder it is up to you when you want to have it replaced, I know now is not a good time.  Consider Prolia injections and we can set it up  See me again 3 weeks AFTER the epidural to see how you are doing

## 2018-06-13 NOTE — Assessment & Plan Note (Signed)
Spinal stenosis.  Fairly severe.  Positive straight leg test.  Patient has difficulty even walking greater than 200 feet.  Patient does not want to do any type of surgery at this point because patient has to take care of her ailing husband.  Patient will try an epidural for diagnostic and hopefully therapeutic purposes.  We discussed which activities to do which was to avoid.  Follow-up with me again in 4 to 6 weeks

## 2018-06-14 ENCOUNTER — Encounter: Payer: Self-pay | Admitting: Family Medicine

## 2018-06-17 ENCOUNTER — Encounter: Payer: Self-pay | Admitting: *Deleted

## 2018-06-20 ENCOUNTER — Ambulatory Visit (INDEPENDENT_AMBULATORY_CARE_PROVIDER_SITE_OTHER): Payer: Medicare Other | Admitting: Internal Medicine

## 2018-06-20 ENCOUNTER — Encounter: Payer: Self-pay | Admitting: Internal Medicine

## 2018-06-20 VITALS — BP 122/82 | HR 68 | Ht 62.0 in | Wt 161.4 lb

## 2018-06-20 DIAGNOSIS — K8681 Exocrine pancreatic insufficiency: Secondary | ICD-10-CM | POA: Diagnosis not present

## 2018-06-20 DIAGNOSIS — I2510 Atherosclerotic heart disease of native coronary artery without angina pectoris: Secondary | ICD-10-CM

## 2018-06-20 DIAGNOSIS — K589 Irritable bowel syndrome without diarrhea: Secondary | ICD-10-CM

## 2018-06-20 DIAGNOSIS — K219 Gastro-esophageal reflux disease without esophagitis: Secondary | ICD-10-CM

## 2018-06-20 NOTE — Progress Notes (Signed)
   Subjective:    Patient ID: [name], female    DOB: [birth year], 72 y.o.   MRN: [id number]  HPI [name] is a 71 yo female with PMH of IBS with overlap exocrine pancreatic insufficiency, history of colon polyps, colonic diverticulosis with history of diverticulitis, GERD who is here for follow-up.  Last seen on 04/08/2018.  Here alone today.  She is been doing very well from a GI perspective.  She has tried to be off Creon and has been off for almost a month.  Her loose stools have not recurred though on several days she has had some crampy discomfort.  About 2 months ago after stopping Creon she developed crampy abdominal pain went back on the medicine and it resolved her symptoms.  She has not needed Lomotil.  Heartburn and GERD symptoms have been doing well and she is using omeprazole just on occasion.  She has recently been diagnosed with spinal stenosis and will soon have an epidural steroid injection.  Review of Systems As per HPI, otherwise negative  Current Medications, Allergies, Past Medical History, Past Surgical History, Family History and Social History were reviewed in [employer] record.      Objective:   Physical Exam BP 122/82   Pulse 68   Ht 5\' 2"  (1.575 m)   Wt 161 lb 6.4 oz (73.2 kg)   BMI 29.52 kg/m  Constitutional: Well-developed and well-nourished. No distress. HEENT: Normocephalic and atraumatic.  Conjunctivae are normal.  No scleral icterus. Neck: Neck supple. Trachea midline. Cardiovascular: Normal rate, regular rhythm and intact distal pulses. No M/R/G Pulmonary/chest: Effort normal and breath sounds normal. No wheezing, rales or rhonchi. Abdominal: Soft, nontender, nondistended. Bowel sounds active throughout. There are no masses palpable. No hepatosplenomegaly. Extremities: no clubbing, cyanosis, or edema Neurological: Alert and oriented to person place and time. Skin: Skin is warm and dry. Psychiatric: Normal mood and  affect. Behavior is normal.     Assessment & Plan:   72 yo female with PMH of IBS with overlap exocrine pancreatic insufficiency, history of colon polyps, colonic diverticulosis with history of diverticulitis, GERD who is here for follow-up.   1.  IBS with EPI --she is really doing much better and her symptoms likely overlap between IBS and pancreatic insufficiency.  She has been off Creon and is doing well.  I am okay if she uses this on an as-needed basis 2 capsules with meals one with snacks.  She can use this if she has loose stools or crampy abdominal discomfort.  Lomotil can be used as a back-up for diarrhea but fortunately she has not needed this.  2.  GERD with history of globus sensation --symptoms under control now and not needing chronic acid suppression therapy.  Can use omeprazole when necessary.  3.  History of colon polyps --5-year surveillance interval  Follow-up in about 6 months, sooner if needed  25 minutes spent with the patient today. Greater than 50% was spent in counseling and coordination of care with the patient

## 2018-06-20 NOTE — Patient Instructions (Signed)
Continue Creon 2 capsules with meals, 1 with snacks.  Continue omeprazole 40 mg daily.  Continue B12 as directed.  Follow up with Dr Hilarie Fredrickson in 6 months, sooner if needed.  If you are age 74 or older, your body mass index should be between 23-30. Your Body mass index is 29.52 kg/m. If this is out of the aforementioned range listed, please consider follow up with your Primary Care Provider.  If you are age 45 or younger, your body mass index should be between 19-25. Your Body mass index is 29.52 kg/m. If this is out of the aformentioned range listed, please consider follow up with your Primary Care Provider.

## 2018-06-24 ENCOUNTER — Other Ambulatory Visit: Payer: Self-pay | Admitting: Cardiology

## 2018-06-24 MED FILL — HYDROCHLOROTHIAZIDE 12.5 MG: 12.5 | 90 days supply | Qty: 90 | Fill #1

## 2018-06-24 MED FILL — ROSUVASTATIN CALCIUM 5 MG T: 5 | 90 days supply | Qty: 90 | Fill #0

## 2018-06-25 ENCOUNTER — Other Ambulatory Visit: Payer: Self-pay

## 2018-06-26 ENCOUNTER — Ambulatory Visit
Admission: RE | Admit: 2018-06-26 | Discharge: 2018-06-26 | Disposition: A | Discharge status: Home / Self Care | Payer: Medicare Other | Source: Ambulatory Visit | Attending: Family Medicine | Admitting: Family Medicine

## 2018-06-26 DIAGNOSIS — M48061 Spinal stenosis, lumbar region without neurogenic claudication: Secondary | ICD-10-CM | POA: Diagnosis not present

## 2018-06-26 DIAGNOSIS — M5416 Radiculopathy, lumbar region: Secondary | ICD-10-CM

## 2018-06-26 MED ORDER — METHYLPREDNISOLONE ACETATE 40 MG/ML INJ SUSP (RADIOLOG
120.0000 mg | Freq: Once | INTRAMUSCULAR | Status: AC
  Administered 2018-06-26: 120 mg via EPIDURAL

## 2018-06-26 MED ORDER — IOPAMIDOL (ISOVUE-M 200) INJECTION 41%
1.0000 mL | Freq: Once | INTRAMUSCULAR | Status: AC
  Administered 2018-06-26: 1 mL via EPIDURAL

## 2018-06-26 NOTE — Discharge Instructions (Signed)

## 2018-07-03 ENCOUNTER — Encounter: Payer: Self-pay | Admitting: Cardiology

## 2018-07-03 ENCOUNTER — Ambulatory Visit: Payer: Self-pay | Admitting: Cardiology

## 2018-07-03 ENCOUNTER — Ambulatory Visit (INDEPENDENT_AMBULATORY_CARE_PROVIDER_SITE_OTHER): Payer: Medicare Other | Admitting: Cardiology

## 2018-07-03 VITALS — BP 150/62 | HR 62 | Ht 62.0 in | Wt 161.1 lb

## 2018-07-03 DIAGNOSIS — R42 Dizziness and giddiness: Secondary | ICD-10-CM | POA: Diagnosis not present

## 2018-07-03 DIAGNOSIS — I2510 Atherosclerotic heart disease of native coronary artery without angina pectoris: Secondary | ICD-10-CM

## 2018-07-03 DIAGNOSIS — I10 Essential (primary) hypertension: Secondary | ICD-10-CM | POA: Diagnosis not present

## 2018-07-03 DIAGNOSIS — I351 Nonrheumatic aortic (valve) insufficiency: Secondary | ICD-10-CM | POA: Diagnosis not present

## 2018-07-03 MED ORDER — LOSARTAN POTASSIUM 25 MG PO TABS: 25.0000 mg | ORAL_TABLET | Freq: Every day | ORAL | 1 refills | Status: DC

## 2018-07-03 MED FILL — LOSARTAN POTASSIUM 25 MG TA: 25 | 90 days supply | Qty: 90 | Fill #0

## 2018-07-03 NOTE — Progress Notes (Signed)
Cardiology Office Note    Date:  07/03/2018   ID:  [name], DOB 20-Jan-1946, MRN [id number]  PCP:  Binnie Rail, MD  Cardiologist:  Ena Dawley, MD   Chief complain: 6 months follow up  History of Present Illness:  [name] is a 72 y.o. female with prior history of hypertension, hyperlipidemia who was previously seen in our office in 2014 for concerns of chest pain and underwent an exercise nuclear stress test that showed LVEF of 70% and no prior infarct or ischemia. She is coming 3 years later for occasional left-sided chest pain not related to exertion. She has been treated with Toprol for high blood pressure. She complains of fatigue and occasional dizziness. She also has occasional lower extremity edema. Denies any orthopnea or proximal nocturnal dyspnea. 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 She has never had an echocardiogram to evaluate for aortic regurgitation or ascending aortic aneurysm. She denies any palpitations or syncope.  10/17/2016 - the patient is coming after 4 weeks, she has visited ER on 10/12/16 with dizziness and chest pain and was discharged home after ACS was ruled out. She states that she feels dizzy ever since she started Benicar. She brings her BP diary and BP in the range 96-150, but most in 120-130.  She has also been experiencing DOE after walking 1 flight of stair associated with left sided chest pain that is pressure like and has no radiation. She has occassional LE edema.   07/30/2017 - 6 months follow up, the patient feels well, denies chest pain or SOB, also denies palpitations, or syncope. No claudications She has been experiencing dizziness, not just orthostatic, but also when is standing for longer time, no falls. She is tolerating medications well including statin.  December 31, 2017 -this is 6 months follow-up, the patient is doing well, she denies any chest pain shortness of breath, no  palpitations, occasional dizziness.  No presyncope or syncope.  She has been tolerating Crestor.  She gets lower extremity edema on her days.  07/03/2018 -6 months follow-up, the patient has been experiencing back pain and was diagnosed with spinal stenosis for which she received steroid injection complicated by inability to walk for couple hours.  This is now resolved.  She feels slightly more dizzy, her blood pressure is elevated today, she has minimal lower extremity edema.  She went to the ER in October with chest pain, ACS was ruled out and after Prilosec was restarted her symptoms have resolved.  Past Medical History:  Diagnosis Date  . Allergy    seasonal  . Ankylosing spondylitis ([place])   . Anxiety   . Arthritis   . Bell palsy   . CTS (carpal tunnel syndrome)   . Cystitis   . Diverticulitis   . Diverticulosis of colon   . Dry eye syndrome   . Exocrine pancreatic insufficiency   . Frozen shoulder   . GERD (gastroesophageal reflux disease)   . HLA B27 (HLA B27 positive)   . Hx of colonic polyps   . Hyperlipidemia    NMR 2005  . Hypertension   . Hypothyroidism   . Incontinence   . Internal hemorrhoids   . Interstitial cystitis   . Kidney stone   . Menopause   . Osteoarthritis   . Osteopenia    [facility]  . S/P total hysterectomy and bilateral salpingo-oophorectomy 04/12/2014  . Sinusitis   .  Sleep apnea    c-pap  . Subjective visual disturbance of both eyes 12/30/2013  . Tubular adenoma of colon   . Vitamin D deficiency   . Xerostomia    and Xeroophthalmia    Past Surgical History:  Procedure Laterality Date  . ABDOMINAL HYSTERECTOMY    . BLADDER SURGERY     for incontinence  . CARPAL TUNNEL RELEASE Left 12/22/2014   Procedure: LEFT CARPAL TUNNEL RELEASE;  Surgeon: Daryll Brod, MD;  Location: [facility];  Service: Orthopedics;  Laterality: Left;  . COLONOSCOPY W/ POLYPECTOMY     adenomatous poyp 04-2008  . colonscopy      tics 10-2002  . CYSTOSCOPY WITH RETROGRADE PYELOGRAM, URETEROSCOPY AND STENT PLACEMENT Left 09/01/2013   Procedure: CYSTOSCOPY WITH RETROGRADE PYELOGRAM, AND LEFT STENT PLACEMENT;  Surgeon: Molli Hazard, MD;  Location: [facility];  Service: Urology;  Laterality: Left;  . elevated LFTs     due to [place]  . FINGER SURGERY Left 2004   2nd finger  . G 1 P 1    . interstitial cystitis     with clot in catheter  . SHOULDER ARTHROSCOPY Left   . TOTAL ABDOMINAL HYSTERECTOMY W/ BILATERAL SALPINGOOPHORECTOMY     dysfunctional menses    Current Medications: Outpatient Medications Prior to Visit  Medication Sig Dispense Refill  . acetaminophen (TYLENOL) 500 MG tablet Take 500 mg by mouth every 4 (four) hours as needed for mild pain.     . Ascorbic Acid (VITAMIN C) 1000 MG tablet Take 1,000 mg by mouth daily.     Marland Kitchen aspirin 81 MG tablet Take 81 mg by mouth daily.      Marland Kitchen BIOTIN PO Take 1 tablet by mouth daily.    Marland Kitchen BIOTIN PO Take by mouth.    . blood glucose meter kit and supplies KIT Dispense based on patient and insurance preference. Use up to four times daily as directed. prediabetes 1 each 0  . cetirizine (ZYRTEC) 10 MG tablet Take 10 mg by mouth daily.    . Diclofenac Sodium 2 % SOLN Place onto the skin as directed for pain as needed.    . diphenoxylate-atropine (LOMOTIL) 2.5-0.025 MG tablet Take 1 tablet by mouth 4 (four) times daily as needed for diarrhea or loose stools. 90 tablet 2  . EPINEPHrine (EPIPEN 2-PAK) 0.3 mg/0.3 mL IJ SOAJ injection Use as directed for severe allergic reactions 2 Device 2  . gabapentin (NEURONTIN) 100 MG capsule Take 100 mg by mouth as needed (blood sugar).    . hydrochlorothiazide (MICROZIDE) 12.5 MG capsule Take 1 capsule (12.5 mg total) by mouth daily. 90 capsule 1  . lipase/protease/amylase (CREON) 36000 UNITS CPEP capsule Take 2 capsules (72,000 Units total) by mouth 3 (three) times daily with meals. 1 capsule with snacks (max 8 capsules daily) 240 capsule 0   . meloxicam (MOBIC) 15 MG tablet TAKE 1 TABLET (15 MG TOTAL) BY MOUTH DAILY AS NEEDED FOR PAIN. WITH FOOD 90 tablet 2  . Olopatadine HCl (PAZEO) 0.7 % SOLN Place 1 drop into both eyes daily. 1 Bottle 5  . Omega-3 Fatty Acids (FISH OIL BURP-LESS) 1000 MG CAPS Takes 1000 mg BID 60 capsule 0  . omeprazole (PRILOSEC) 40 MG capsule TAKE 1 CAPSULE (40 MG TOTAL) BY MOUTH DAILY. 30 capsule 5  . orphenadrine (NORFLEX) 100 MG tablet Take 1 tablet (100 mg total) by mouth 2 (two) times daily. 30 tablet 0  . Polyethyl Glycol-Propyl Glycol (SYSTANE OP) Apply 1  drop to eye as needed. Apply to both eyes as needed    . rosuvastatin (CRESTOR) 5 MG tablet TAKE 1 TABLET (5 MG TOTAL) BY MOUTH DAILY. 90 tablet 3  . vitamin B-12 (CYANOCOBALAMIN) 1000 MCG tablet Take 1,000 mcg by mouth daily.     . vitamin E 400 UNIT capsule Take 400 Units by mouth daily.    Marland Kitchen gabapentin (NEURONTIN) 100 MG capsule Take 1 capsule (100 mg total) by mouth at bedtime. (Patient taking differently: Take 100 mg by mouth as needed. ) 30 capsule 3   No facility-administered medications prior to visit.      Allergies:   Bactrim [sulfamethoxazole-trimethoprim]; Nitroglycerin; Pentosan polysulfate sodium; Promethazine hcl; Sulfamethoxazole-trimethoprim; and Lisinopril   Social History   Socioeconomic History  . Marital status: Married    Spouse name: Not on file  . Number of children: 1  . Years of education: BS  . Highest education level: Not on file  Occupational History  . Occupation: retired  . Occupation: Pharmacist, hospital    Comment: Kindergarten  Social Needs  . Financial resource strain: Not hard at all  . Food insecurity:    Worry: Never true    Inability: Never true  . Transportation needs:    Medical: No    Non-medical: No  Tobacco Use  . Smoking status: Never Smoker  . Smokeless tobacco: Never Used  Substance and Sexual Activity  . Alcohol use: Not Currently    Comment:      . Drug use: No  . Sexual activity: Not  Currently  Lifestyle  . Physical activity:    Days per week: 0 days    Minutes per session: 0 min  . Stress: Rather much  Relationships  . Social connections:    Talks on phone: More than three times a week    Gets together: More than three times a week    Attends religious service: Not on file    Active member of club or organization: Not on file    Attends meetings of clubs or organizations: Not on file    Relationship status: Married  Other Topics Concern  . Not on file  Social History Narrative   Lives   Caffeine use:    Married       Family History:  The patient's family history includes Allergic rhinitis in her sister and sister; Asthma in her mother; Cancer in her maternal aunt, paternal aunt, and sister; Cirrhosis in her father; Colon cancer (age of onset: 6) in her sister; Coronary artery disease in her maternal grandfather and mother; Dementia in her mother; Diabetes in her father, paternal aunt, and paternal grandmother; Food Allergy in her sister; Hypertension in her mother; Stroke in her mother.   ROS:   Please see the history of present illness.    ROS All other systems reviewed and are negative.   PHYSICAL EXAM:   VS:  BP (!) 150/62   Pulse 62   Ht 5' 2"  (1.575 m)   Wt 161 lb 1.9 oz (73.1 kg)   SpO2 97%   BMI 29.47 kg/m    GEN: Well nourished, well developed, in no acute distress  HEENT: normal  Neck: no JVD, carotid bruits, or masses Cardiac: RRR; no murmurs, rubs, or gallops,no edema  Respiratory:  clear to auscultation bilaterally, normal work of breathing GI: soft, nontender, nondistended, + BS MS: no deformity or atrophy  Skin: warm and dry, no rash Neuro:  Alert and Oriented x 3, Strength and  sensation are intact Psych: euthymic mood, full affect  Wt Readings from Last 3 Encounters:  07/03/18 161 lb 1.9 oz (73.1 kg)  06/20/18 161 lb 6.4 oz (73.2 kg)  06/13/18 157 lb (71.2 kg)    Studies/Labs Reviewed:   EKG:  EKG is ordered today.  The ekg  ordered today demonstrates sinus bradycardia, 56 bpm, left axis deviation, nonspecific T wave abnormalities.  Recent Labs: 09/24/2017: Magnesium 2.0 03/20/2018: TSH 4.32 05/03/2018: ALT 24 05/17/2018: BUN 18; Creatinine, Ser 1.10; Hemoglobin 12.4; Platelets 268; Potassium 3.6; Sodium 138   Lipid Panel    Component Value Date/Time   CHOL 150 05/03/2018 0913   CHOL 125 08/06/2017 0924   TRIG 95.0 05/03/2018 0913   TRIG 181 (H) 07/23/2006 1101   HDL 65.90 05/03/2018 0913   HDL 56 08/06/2017 0924   CHOLHDL 2 05/03/2018 0913   VLDL 19.0 05/03/2018 0913   LDLCALC 65 05/03/2018 0913   LDLCALC 43 08/06/2017 0924   LDLDIRECT 114.0 05/02/2016 0919   Additional studies/ records that were reviewed today include:   ECG performed today 12/31/2017 shows sinus bradycardia 46 bpm, left anterior fascicular block, heart rate slower than previously otherwise unchanged, this was personally reviewed.   ASSESSMENT:    1. Essential hypertension   2. CAD in native artery   3. Dizziness     PLAN:  In order of problems listed above:  1. Hypertension - uncontrolled, there is mild to moderate concentric LVH, LVEF 60-65%, most probably secondary to steroids that she received.  I will add losartan 25 mg daily and have her follow-up with our pharmacist in 3 weeks. 2. Hyperlipidemia -LDL and HDL at goal with rosuvastatin. 3. Chest pain - negative stress test in the past, calcium score and coronary CTA showed coronary calcium score of 56. This was 30 percentile for age and sex matched control. Moderate CAD in RCA/PDA, CT FFR was normal.  Recent chest pain resolved with Prilosec. 4. HLA B gene positivity - associated ankylosing spondylitis, echo in 09/2016 showed no ascending aortic aneurysm but mild aortic regurgitation. We will follow every other year.  Follow-up with blood pressure clinic in 3 weeks, follow-up with [place] in 4 months.  Medication Adjustments/Labs and Tests Ordered: Current medicines are reviewed  at length with the patient today.  Concerns regarding medicines are outlined above.  Medication changes, Labs and Tests ordered today are listed in the Patient Instructions below. Patient Instructions  Medication Instructions:   START TAKING LOSARTAN 25 MG ONCE DAILY     Follow-Up:  3 WEEKS WITH KELLEY AUTEN PHARMACIST [facility]   4 MONTHS WITH DR Meda Coffee       If you need a refill on your cardiac medications before your next appointment, please call your pharmacy.      Signed, Ena Dawley, MD  07/03/2018 9:15 AM    [address], [place], [place]  [address] Phone: [phone]; Fax: [phone]

## 2018-07-03 NOTE — Patient Instructions (Addendum)
Medication Instructions:   START TAKING LOSARTAN 25 MG ONCE DAILY      Testing:    Your physician has requested that you have an echocardiogram. Echocardiography is a painless test that uses sound waves to create images of your heart. It provides your doctor with information about the size and shape of your heart and how well your heart's chambers and valves are working. This procedure takes approximately one hour. There are no restrictions for this procedure.      Follow-Up:  3 WEEKS WITH KELLEY AUTEN PHARMACIST IN [facility]   4 MONTHS WITH DR Meda Coffee       If you need a refill on your cardiac medications before your next appointment, please call your pharmacy.

## 2018-07-15 MED FILL — OMEPRAZOLE 40 MG CPDR: 40 | 30 days supply | Qty: 30 | Fill #1

## 2018-07-15 MED FILL — PAZEO 0.7% EYE DROPS: 0.7 | 25 days supply | Qty: 3 | Fill #1

## 2018-07-18 ENCOUNTER — Telehealth: Payer: Self-pay

## 2018-07-18 ENCOUNTER — Encounter: Payer: Self-pay | Admitting: Family Medicine

## 2018-07-18 ENCOUNTER — Ambulatory Visit: Payer: Self-pay

## 2018-07-18 ENCOUNTER — Other Ambulatory Visit (INDEPENDENT_AMBULATORY_CARE_PROVIDER_SITE_OTHER): Payer: Medicare Other

## 2018-07-18 ENCOUNTER — Ambulatory Visit (INDEPENDENT_AMBULATORY_CARE_PROVIDER_SITE_OTHER)
Admission: RE | Admit: 2018-07-18 | Discharge: 2018-07-18 | Disposition: A | Discharge status: Home / Self Care | Payer: Medicare Other | Source: Ambulatory Visit | Attending: Family Medicine | Admitting: Family Medicine

## 2018-07-18 ENCOUNTER — Ambulatory Visit (INDEPENDENT_AMBULATORY_CARE_PROVIDER_SITE_OTHER): Payer: Medicare Other | Admitting: Family Medicine

## 2018-07-18 VITALS — BP 138/64 | HR 69 | Ht 62.0 in | Wt 161.0 lb

## 2018-07-18 DIAGNOSIS — F419 Anxiety disorder, unspecified: Secondary | ICD-10-CM

## 2018-07-18 DIAGNOSIS — M19012 Primary osteoarthritis, left shoulder: Secondary | ICD-10-CM

## 2018-07-18 DIAGNOSIS — M7121 Synovial cyst of popliteal space [Baker], right knee: Secondary | ICD-10-CM | POA: Diagnosis not present

## 2018-07-18 DIAGNOSIS — I2510 Atherosclerotic heart disease of native coronary artery without angina pectoris: Secondary | ICD-10-CM | POA: Diagnosis not present

## 2018-07-18 DIAGNOSIS — M25522 Pain in left elbow: Secondary | ICD-10-CM | POA: Diagnosis not present

## 2018-07-18 DIAGNOSIS — E559 Vitamin D deficiency, unspecified: Secondary | ICD-10-CM | POA: Diagnosis not present

## 2018-07-18 DIAGNOSIS — M79602 Pain in left arm: Secondary | ICD-10-CM

## 2018-07-18 DIAGNOSIS — M2550 Pain in unspecified joint: Secondary | ICD-10-CM | POA: Diagnosis not present

## 2018-07-18 LAB — COMPREHENSIVE METABOLIC PANEL
ALT: 29 U/L (ref 0–35)
AST: 26 U/L (ref 0–37)
Albumin: 4.5 g/dL (ref 3.5–5.2)
Alkaline Phosphatase: 78 U/L (ref 39–117)
BUN: 17 mg/dL (ref 6–23)
CO2: 27 meq/L (ref 19–32)
Calcium: 10.1 mg/dL (ref 8.4–10.5)
Chloride: 103 mEq/L (ref 96–112)
Creatinine, Ser: 1.02 mg/dL (ref 0.40–1.20)
GFR: 56.24 mL/min — ABNORMAL LOW (ref >60.00)
Glucose, Bld: 148 mg/dL — ABNORMAL HIGH (ref 70–99)
Potassium: 3.4 mEq/L — ABNORMAL LOW (ref 3.5–5.1)
SODIUM: 141 meq/L (ref 135–145)
Total Bilirubin: 0.5 mg/dL (ref 0.2–1.2)
Total Protein: 7.8 g/dL (ref 6.0–8.3)

## 2018-07-18 LAB — CBC WITH DIFFERENTIAL/PLATELET
Absolute Monocytes: 518 cells/uL (ref 200–950)
Basophils Absolute: 0 10*3/uL (ref 0.0–0.1)
Basophils Absolute: 38 cells/uL (ref 0–200)
Basophils Relative: 0.7 % (ref 0.0–3.0)
Basophils Relative: 0.6 %
Eosinophils Absolute: 0.3 10*3/uL (ref 0.0–0.7)
Eosinophils Absolute: 288 cells/uL (ref 15–500)
Eosinophils Relative: 4.3 % (ref 0.0–5.0)
Eosinophils Relative: 4.5 %
HCT: 37.5 % (ref 36.0–46.0)
HCT: 37.4 % (ref 35.0–45.0)
Hemoglobin: 12.7 g/dL (ref 12.0–15.0)
Hemoglobin: 12.2 g/dL (ref 11.7–15.5)
LYMPHS ABS: 1.5 10*3/uL (ref 0.7–4.0)
Lymphocytes Relative: 23.7 % (ref 12.0–46.0)
Lymphs Abs: 1574 cells/uL (ref 850–3900)
MCH: 29 pg (ref 27.0–33.0)
MCHC: 33.8 g/dL (ref 30.0–36.0)
MCHC: 32.6 g/dL (ref 32.0–36.0)
MCV: 88.7 fl (ref 78.0–100.0)
MCV: 89 fL (ref 80.0–100.0)
MONO ABS: 0.5 10*3/uL (ref 0.1–1.0)
MPV: 9.4 fL (ref 7.5–12.5)
Monocytes Relative: 7.4 % (ref 3.0–12.0)
Monocytes Relative: 8.1 %
Neutro Abs: 4 10*3/uL (ref 1.4–7.7)
Neutro Abs: 3981 cells/uL (ref 1500–7800)
Neutrophils Relative %: 63.9 % (ref 43.0–77.0)
Neutrophils Relative %: 62.2 %
PLATELETS: 252 10*3/uL (ref 140–400)
Platelets: 243 10*3/uL (ref 150.0–400.0)
RBC: 4.23 Mil/uL (ref 3.87–5.11)
RBC: 4.2 10*6/uL (ref 3.80–5.10)
RDW: 13.4 % (ref 11.5–15.5)
RDW: 12.7 % (ref 11.0–15.0)
Total Lymphocyte: 24.6 %
WBC: 6.3 10*3/uL (ref 4.0–10.5)
WBC: 6.4 10*3/uL (ref 3.8–10.8)

## 2018-07-18 LAB — IBC PANEL
Iron: 90 ug/dL (ref 42–145)
SATURATION RATIOS: 24.2 % (ref 20.0–50.0)
Transferrin: 266 mg/dL (ref 212.0–360.0)

## 2018-07-18 LAB — TROPONIN I: TNIDX: 0 ug/l (ref 0.00–0.06)

## 2018-07-18 LAB — SEDIMENTATION RATE: SED RATE: 26 mm/h (ref 0–30)

## 2018-07-18 LAB — FERRITIN: Ferritin: 93.3 ng/mL (ref 10.0–291.0)

## 2018-07-18 LAB — URIC ACID: Uric Acid, Serum: 4.8 mg/dL (ref 2.4–7.0)

## 2018-07-18 LAB — VITAMIN D 25 HYDROXY (VIT D DEFICIENCY, FRACTURES): VITD: 62.59 ng/mL (ref 30.00–100.00)

## 2018-07-18 NOTE — Patient Instructions (Signed)
Good to see you  Ice 20 minutes 2 times daily. Usually after activity and before bed. Injected knee Go downstairs and get las I think the arm will be fine overall  If worsenign chest pain and or shortness of breath please go to emergency room immediately.  Send me a note after your echo and I will look at it as well

## 2018-07-18 NOTE — Assessment & Plan Note (Signed)
I believe most of this is anxiety.  I believe that patient is overwhelmed being the primary caregiver for her disabled spouse.  Patient states that she does not have depression but seems to have anxiety.  Uses multiple defense mechanisms to protect herself at this moment and not deal with her health issues.  We will get laboratory work-up to rule out any type of cardiac issues that could be contributing to some of the left radicular pain.  On exam today patient does have the rotator cuff arthropathy but actually fairly good movement at the moment.  We discussed with patient about icing regimen and home exercises for that.  Follow-up with me again 4 weeks

## 2018-07-18 NOTE — Assessment & Plan Note (Signed)
Patient given injection today.  Tolerated the procedure well.  We discussed potential compression.  Home exercise, which activities to do which wants to avoid.  Follow-up with me again in 4 to 8 weeks

## 2018-07-18 NOTE — Progress Notes (Signed)
[name] [address] [place], [place] [address] Phone: [phone] Subjective:     CC: Left arm pain  GBT:[id number]  TIMMIA COGBURN is a 72 y.o. female coming in with complaint of right knee pain and left shoulder.   Patient was reaching for radio volume button and felt a pop in left bicep. Has been using ice. Notes hand swelling but patient seems reluctant to move arm as we are walking back to room. Patient is having tingling in hand. Patient had sharp pain last night. Pain today is dull. Was packing up items yesterday and is unsure if she lifted something heavy.   Right knee pain began a few weeks ago. Pain posterior. Denies any calf pain or radiating symptoms. Patient is having leg cramps more frequently.        Past Medical History:  Diagnosis Date  . Allergy    seasonal  . Ankylosing spondylitis ([place])   . Anxiety   . Arthritis   . Bell palsy   . CTS (carpal tunnel syndrome)   . Cystitis   . Diverticulitis   . Diverticulosis of colon   . Dry eye syndrome   . Exocrine pancreatic insufficiency   . Frozen shoulder   . GERD (gastroesophageal reflux disease)   . HLA B27 (HLA B27 positive)   . Hx of colonic polyps   . Hyperlipidemia    NMR 2005  . Hypertension   . Hypothyroidism   . Incontinence   . Internal hemorrhoids   . Interstitial cystitis   . Kidney stone   . Menopause   . Osteoarthritis   . Osteopenia    [facility]  . S/P total hysterectomy and bilateral salpingo-oophorectomy 04/12/2014  . Sinusitis   . Sleep apnea    c-pap  . Subjective visual disturbance of both eyes 12/30/2013  . Tubular adenoma of colon   . Vitamin D deficiency   . Xerostomia    and Xeroophthalmia   Past Surgical History:  Procedure Laterality Date  . ABDOMINAL HYSTERECTOMY    . BLADDER SURGERY     for incontinence  . CARPAL TUNNEL RELEASE Left 12/22/2014   Procedure: LEFT CARPAL TUNNEL RELEASE;  Surgeon: Daryll Brod, MD;   Location: [facility];  Service: Orthopedics;  Laterality: Left;  . COLONOSCOPY W/ POLYPECTOMY     adenomatous poyp 04-2008  . colonscopy     tics 10-2002  . CYSTOSCOPY WITH RETROGRADE PYELOGRAM, URETEROSCOPY AND STENT PLACEMENT Left 09/01/2013   Procedure: CYSTOSCOPY WITH RETROGRADE PYELOGRAM, AND LEFT STENT PLACEMENT;  Surgeon: Molli Hazard, MD;  Location: [facility];  Service: Urology;  Laterality: Left;  . elevated LFTs     due to Elmiron  . FINGER SURGERY Left 2004   2nd finger  . G 1 P 1    . interstitial cystitis     with clot in catheter  . SHOULDER ARTHROSCOPY Left   . TOTAL ABDOMINAL HYSTERECTOMY W/ BILATERAL SALPINGOOPHORECTOMY     dysfunctional menses   Social History   Socioeconomic History  . Marital status: Married    Spouse name: Not on file  . Number of children: 1  . Years of education: BS  . Highest education level: Not on file  Occupational History  . Occupation: retired  . Occupation: Pharmacist, hospital    Comment: Kindergarten  Social Needs  . Financial resource strain: Not hard at all  . Food insecurity:    Worry:  Never true    Inability: Never true  . Transportation needs:    Medical: No    Non-medical: No  Tobacco Use  . Smoking status: Never Smoker  . Smokeless tobacco: Never Used  Substance and Sexual Activity  . Alcohol use: Not Currently    Comment:      . Drug use: No  . Sexual activity: Not Currently  Lifestyle  . Physical activity:    Days per week: 0 days    Minutes per session: 0 min  . Stress: Rather much  Relationships  . Social connections:    Talks on phone: More than three times a week    Gets together: More than three times a week    Attends religious service: Not on file    Active member of club or organization: Not on file    Attends meetings of clubs or organizations: Not on file    Relationship status: Married  Other Topics Concern  . Not on file  Social History Narrative   Lives   Caffeine use:     Married      Allergies  Allergen Reactions  . Bactrim [Sulfamethoxazole-Trimethoprim] Other (See Comments)  . Nitroglycerin Anaphylaxis    Hypotensive after NTG administration in ER during chest pain evaluation Pt states she almost died,  Pt states in a procedure done in 10/2016 she had no reaction to this medication, so not sure if it is an actual allergy.   . Pentosan Polysulfate Sodium     Elevated LFTs.......Marland Kitchen elmiron   . Promethazine Hcl Other (See Comments)    "jittery on the inside"  . Sulfamethoxazole-Trimethoprim Nausea Only    Unknown reaction per pt, Pt thinks she may have felt sick to her stomach.   . Lisinopril Cough   Family History  Problem Relation Age of Onset  . Coronary artery disease Mother   . Hypertension Mother   . Asthma Mother   . Dementia Mother   . Stroke Mother        mini cva  . Diabetes Father   . Cirrhosis Father        non alcoholic  . Cancer Sister        colon  . Colon cancer Sister 32  . Allergic rhinitis Sister   . Allergic rhinitis Sister   . Food Allergy Sister   . Cancer Paternal Aunt        2 aunts had renal cancer  . Coronary artery disease Maternal Grandfather   . Cancer Maternal Aunt        breast  . Diabetes Paternal Aunt   . Diabetes Paternal Grandmother   . Esophageal cancer Neg Hx   . Stomach cancer Neg Hx      Current Outpatient Medications (Cardiovascular):  Marland Kitchen  EPINEPHrine (EPIPEN 2-PAK) 0.3 mg/0.3 mL IJ SOAJ injection, Use as directed for severe allergic reactions .  hydrochlorothiazide (MICROZIDE) 12.5 MG capsule, Take 1 capsule (12.5 mg total) by mouth daily. Marland Kitchen  losartan (COZAAR) 25 MG tablet, Take 1 tablet (25 mg total) by mouth daily. .  rosuvastatin (CRESTOR) 5 MG tablet, TAKE 1 TABLET (5 MG TOTAL) BY MOUTH DAILY.  Current Outpatient Medications (Respiratory):  .  cetirizine (ZYRTEC) 10 MG tablet, Take 10 mg by mouth daily.  Current Outpatient Medications (Analgesics):  .  acetaminophen (TYLENOL) 500 MG  tablet, Take 500 mg by mouth every 4 (four) hours as needed for mild pain.  Marland Kitchen  aspirin 81 MG tablet, Take 81 mg  by mouth daily.   .  meloxicam (MOBIC) 15 MG tablet, TAKE 1 TABLET (15 MG TOTAL) BY MOUTH DAILY AS NEEDED FOR PAIN. WITH FOOD  Current Outpatient Medications (Hematological):  .  vitamin B-12 (CYANOCOBALAMIN) 1000 MCG tablet, Take 1,000 mcg by mouth daily.   Current Outpatient Medications (Other):  Marland Kitchen  Ascorbic Acid (VITAMIN C) 1000 MG tablet, Take 1,000 mg by mouth daily.  Marland Kitchen  BIOTIN PO, Take 1 tablet by mouth daily. Marland Kitchen  BIOTIN PO, Take by mouth. .  blood glucose meter kit and supplies KIT, Dispense based on patient and insurance preference. Use up to four times daily as directed. prediabetes .  Diclofenac Sodium 2 % SOLN, Place onto the skin as directed for pain as needed. .  diphenoxylate-atropine (LOMOTIL) 2.5-0.025 MG tablet, Take 1 tablet by mouth 4 (four) times daily as needed for diarrhea or loose stools. .  gabapentin (NEURONTIN) 100 MG capsule, Take 100 mg by mouth as needed (blood sugar). Marland Kitchen  lipase/protease/amylase (CREON) 36000 UNITS CPEP capsule, Take 2 capsules (72,000 Units total) by mouth 3 (three) times daily with meals. 1 capsule with snacks (max 8 capsules daily) .  Olopatadine HCl (PAZEO) 0.7 % SOLN, Place 1 drop into both eyes daily. .  Omega-3 Fatty Acids (FISH OIL BURP-LESS) 1000 MG CAPS, Takes 1000 mg BID .  omeprazole (PRILOSEC) 40 MG capsule, TAKE 1 CAPSULE (40 MG TOTAL) BY MOUTH DAILY. .  orphenadrine (NORFLEX) 100 MG tablet, Take 1 tablet (100 mg total) by mouth 2 (two) times daily. Vladimir Faster Glycol-Propyl Glycol (SYSTANE OP), Apply 1 drop to eye as needed. Apply to both eyes as needed .  vitamin E 400 UNIT capsule, Take 400 Units by mouth daily.    Past medical history, social, surgical and family history all reviewed in electronic medical record.  No pertanent information unless stated regarding to the chief complaint.   Review of Systems:  No  headache, visual changes, nausea, vomiting, diarrhea, constipation, dizziness, abdominal pain, skin rash, fevers, chills, night sweats, weight loss, swollen lymph nodes, body aches, joint swelling,  chest pain, shortness of breath, m  Positive muscle aches, mood changes  Objective  Blood pressure 138/64, pulse 69, height 5' 2"  (1.575 m), weight 161 lb (73 kg), SpO2 96 %.    General: No apparent distress alert and oriented x3 mood and affect normal, dressed appropriately.  HEENT: Pupils equal, extraocular movements intact  Respiratory: Patient's speak in full sentences and does not appear short of breath  Cardiovascular: No lower extremity edema, non tender, no erythema 2 of 6 stock ejection murmur heard at the left lower sternal border Skin: Warm dry intact with no signs of infection or rash on extremities or on axial skeleton.  Abdomen: Soft nontender  Neuro: Cranial nerves II through XII are intact, neurovascularly intact in all extremities with 2+ DTRs and 2+ pulses.  Lymph: No lymphadenopathy of posterior or anterior cervical chain or axillae bilaterally.  Gait normal with good balance and coordination.  MSK:  tender with limited range of motion and good stability and symmetric strength and tone of , elbows, wrist, hip, and ankles bilaterally.  Left shoulder exam does have crepitus noted and patient's range of motion seems to be at patient's baseline.  Patient has some mild impingement but nothing severe.  No signs of any type of abnormality to the humerus itself.  Patient also has a instability of the right knee.  Fullness of the popliteal area.  Patient does have lack  of 5 degrees of extension and 10 degrees of flexion in the knee.  After informed written and verbal consent, patient was seated on exam table. Right knee was prepped with alcohol swab and utilizing anterolateral approach, patient's right knee space was injected with 4:1  marcaine 0.5%: Kenalog 8m/dL. Patient tolerated the  procedure well without immediate complications.    Impression and Recommendations:     This case required medical decision making of moderate complexity. The above documentation has been reviewed and is accurate and complete ZLyndal Pulley DO       Note: This dictation was prepared with Dragon dictation along with smaller phrase technology. Any transcriptional errors that result from this process are unintentional.

## 2018-07-18 NOTE — Assessment & Plan Note (Signed)
Stable.  Discussed icing regimen and home exercises.  Discussed which activities to do which wants to avoid

## 2018-07-18 NOTE — Telephone Encounter (Signed)
Called patient to schedule appointment for Baker's cyst and shoulder pain.

## 2018-07-22 LAB — CALCIUM, IONIZED: Calcium, Ion: 5.6 mg/dL (ref 4.8–5.6)

## 2018-07-22 LAB — PTH, INTACT AND CALCIUM: Calcium: 10.3 mg/dL (ref 8.6–10.4)

## 2018-07-23 ENCOUNTER — Encounter: Payer: Self-pay | Admitting: Pharmacist

## 2018-07-23 ENCOUNTER — Other Ambulatory Visit: Payer: Self-pay

## 2018-07-23 ENCOUNTER — Ambulatory Visit (INDEPENDENT_AMBULATORY_CARE_PROVIDER_SITE_OTHER): Payer: Medicare Other | Admitting: Pharmacist

## 2018-07-23 ENCOUNTER — Ambulatory Visit (HOSPITAL_COMMUNITY): Payer: Medicare Other | Attending: Cardiovascular Disease

## 2018-07-23 VITALS — BP 158/82 | HR 63

## 2018-07-23 DIAGNOSIS — I10 Essential (primary) hypertension: Secondary | ICD-10-CM | POA: Diagnosis not present

## 2018-07-23 DIAGNOSIS — I2510 Atherosclerotic heart disease of native coronary artery without angina pectoris: Secondary | ICD-10-CM | POA: Insufficient documentation

## 2018-07-23 DIAGNOSIS — I351 Nonrheumatic aortic (valve) insufficiency: Secondary | ICD-10-CM

## 2018-07-23 DIAGNOSIS — R42 Dizziness and giddiness: Secondary | ICD-10-CM | POA: Diagnosis not present

## 2018-07-23 MED ORDER — LOSARTAN POTASSIUM 50 MG PO TABS: 50.0000 mg | ORAL_TABLET | Freq: Every day | ORAL | 1 refills | Status: DC

## 2018-07-23 NOTE — Patient Instructions (Signed)
Return for a follow up appointment in 4-6 weeks  Go to the lab with follow up   Check your blood pressure at home daily (if able) and keep record of the readings.  Take your BP meds as follows: INCREASE losartan to 50mg  daily (take 2 tablets of your current supply)  Continue all other medications as prescribed   Bring all of your meds, your BP cuff and your record of home blood pressures to your next appointment.  Exercise as you're able, try to walk approximately 30 minutes per day.  Keep salt intake to a minimum, especially watch canned and prepared boxed foods.  Eat more fresh fruits and vegetables and fewer canned items.  Avoid eating in fast food restaurants.    HOW TO TAKE YOUR BLOOD PRESSURE: . Rest 5 minutes before taking your blood pressure. .  Don't smoke or drink caffeinated beverages for at least 30 minutes before. . Take your blood pressure before (not after) you eat. . Sit comfortably with your back supported and both feet on the floor (don't cross your legs). . Elevate your arm to heart level on a table or a desk. . Use the proper sized cuff. It should fit smoothly and snugly around your bare upper arm. There should be enough room to slip a fingertip under the cuff. The bottom edge of the cuff should be 1 inch above the crease of the elbow. . Ideally, take 3 measurements at one sitting and record the average.

## 2018-07-23 NOTE — Progress Notes (Signed)
Patient ID: [name]                 DOB: 1945/11/26                      MRN: [id number]     HPI: [name] is a 72 y.o. female patient of Dr. Meda Coffee who presents today for hypertension evaluation. PMH significant for hypertension, hyperlipidemia who was previously seen in our office in 2014 for concerns of chest pain and underwent an exercise nuclear stress test that showed LVEF of 70% and no prior infarct or ischemia. At her most recent visit with Dr. Meda Coffee, her losartan was increased to 72m daily. She recently had a spinal injection of steroids.   She presents today for BP management. She has been having lots of leg cramps more recently. She has a hard time with lightheadedness - she is unable to walk a straight line (she is very lethargic). She feel foggy-headed pretty much all day. She is more lethargic later in the day. She recently had a spinal injection and her legs were completely numb for 2 hours after the injection.   She is having severe leg cramps. She was on metoprolol for years and when she stopped due to bradycardia, but she feels that she did well on this medication.   BP elevated since spinal injection and Dr. NMeda Coffeeadvised that this could be secondary to this injection.   Current HTN meds:  HCTZ 12.585mdaily Losartan 2552maily  Previously tried: lisinopril, Toprol - bradycardia   BP goal: <130/80  Family History: Allergic rhinitis in her sister and sister; Asthma in her mother; Cancer in her maternal aunt, paternal aunt, and sister; Cirrhosis in her father; Colon cancer (age of onset: 67)23n her sister; Coronary artery disease in her maternal grandfather and mother; Dementia in her mother; Diabetes in her father, paternal aunt, and paternal grandmother; Food Allergy in her sister; Hypertension in her mother; Stroke in her mother.   Social History: denies tobacco and alcohol  Diet: Most meals from home, when does eat out from [employer] not  eat salad due to GI complications. She has tried to increase her water intake. She does drink sweet and unsweet tea. She does drink cola, but has tried to cut back.   Exercise: She has been working in the yard prior to her back issues.   Home BP readings: she has not been checking recently  Wt Readings from Last 3 Encounters:  07/18/18 161 lb (73 kg)  07/03/18 161 lb 1.9 oz (73.1 kg)  06/20/18 161 lb 6.4 oz (73.2 kg)   BP Readings from Last 3 Encounters:  07/23/18 (!) 158/82  07/18/18 138/64  07/03/18 (!) 150/62   Pulse Readings from Last 3 Encounters:  07/23/18 63  07/18/18 69  07/03/18 62    Renal function: Estimated Creatinine Clearance: 45.3 mL/min (by C-G formula based on SCr of 1.02 mg/dL).  Past Medical History:  Diagnosis Date  . Allergy    seasonal  . [place] . Anxiety   . Arthritis   . Bell palsy   . CTS (carpal tunnel syndrome)   . Cystitis   . Diverticulitis   . Diverticulosis of colon   . Dry eye syndrome   . Exocrine pancreatic insufficiency   . Frozen shoulder   . GERD (gastroesophageal reflux disease)   . HLA B27 (HLA B27 positive)   . Hx  of colonic polyps   . Hyperlipidemia    NMR 2005  . Hypertension   . Hypothyroidism   . Incontinence   . Internal hemorrhoids   . Interstitial cystitis   . Kidney stone   . Menopause   . Osteoarthritis   . Osteopenia    [facility]  . S/P total hysterectomy and bilateral salpingo-oophorectomy 04/12/2014  . Sinusitis   . Sleep apnea    c-pap  . Subjective visual disturbance of both eyes 12/30/2013  . Tubular adenoma of colon   . Vitamin D deficiency   . Xerostomia    and Xeroophthalmia    Current Outpatient Medications on File Prior to Visit  Medication Sig Dispense Refill  . acetaminophen (TYLENOL) 500 MG tablet Take 500 mg by mouth every 4 (four) hours as needed for mild pain.     . Ascorbic Acid (VITAMIN C) 1000 MG tablet Take 1,000 mg by mouth daily.      Marland Kitchen aspirin 81 MG tablet Take 81 mg by mouth daily.      . blood glucose meter kit and supplies KIT Dispense based on patient and insurance preference. Use up to four times daily as directed. prediabetes 1 each 0  . cetirizine (ZYRTEC) 10 MG tablet Take 10 mg by mouth daily.    . Cholecalciferol (VITAMIN D-3) 25 MCG (1000 UT) CAPS Take 5,000 Units by mouth daily.    . Diclofenac Sodium 2 % SOLN Place onto the skin as directed for pain as needed.    . diphenoxylate-atropine (LOMOTIL) 2.5-0.025 MG tablet Take 1 tablet by mouth 4 (four) times daily as needed for diarrhea or loose stools. 90 tablet 2  . EPINEPHrine (EPIPEN 2-PAK) 0.3 mg/0.3 mL IJ SOAJ injection Use as directed for severe allergic reactions 2 Device 2  . hydrochlorothiazide (MICROZIDE) 12.5 MG capsule Take 1 capsule (12.5 mg total) by mouth daily. 90 capsule 1  . lipase/protease/amylase (CREON) 36000 UNITS CPEP capsule Take 2 capsules (72,000 Units total) by mouth 3 (three) times daily with meals. 1 capsule with snacks (max 8 capsules daily) 240 capsule 0  . meloxicam (MOBIC) 15 MG tablet TAKE 1 TABLET (15 MG TOTAL) BY MOUTH DAILY AS NEEDED FOR PAIN. WITH FOOD 90 tablet 2  . Olopatadine HCl (PAZEO) 0.7 % SOLN Place 1 drop into both eyes daily. 1 Bottle 5  . Omega-3 Fatty Acids (FISH OIL BURP-LESS) 1000 MG CAPS Takes 1000 mg BID 60 capsule 0  . omeprazole (PRILOSEC) 40 MG capsule TAKE 1 CAPSULE (40 MG TOTAL) BY MOUTH DAILY. 30 capsule 5  . orphenadrine (NORFLEX) 100 MG tablet Take 1 tablet (100 mg total) by mouth 2 (two) times daily. 30 tablet 0  . Polyethyl Glycol-Propyl Glycol (SYSTANE OP) Apply 1 drop to eye as needed. Apply to both eyes as needed    . rosuvastatin (CRESTOR) 5 MG tablet TAKE 1 TABLET (5 MG TOTAL) BY MOUTH DAILY. 90 tablet 3  . vitamin B-12 (CYANOCOBALAMIN) 1000 MCG tablet Take 1,000 mcg by mouth daily.     . vitamin E 400 UNIT capsule Take 400 Units by mouth daily.     No current facility-administered medications on  file prior to visit.     Allergies  Allergen Reactions  . Bactrim [Sulfamethoxazole-Trimethoprim] Other (See Comments)  . Nitroglycerin Anaphylaxis    Hypotensive after NTG administration in ER during chest pain evaluation Pt states she almost died,  Pt states in a procedure done in 10/2016 she had  no reaction to this medication, so not sure if it is an actual allergy.   . Pentosan Polysulfate Sodium     Elevated LFTs.......Marland Kitchen elmiron   . Promethazine Hcl Other (See Comments)    "jittery on the inside"  . Sulfamethoxazole-Trimethoprim Nausea Only    Unknown reaction per pt, Pt thinks she may have felt sick to her stomach.   . Lisinopril Cough    Blood pressure (!) 158/82, pulse 63.   Assessment/Plan: Hypertension: BP today is elevated above goal. Will increase losartan to 75m daily. Continue HCTZ at current dose. BMET recent with low potassium, which could be contributing to leg cramping. Will repeat BMET at follow up if potassium still low will pursue supplementation. Advised to monitor pressures and follow up in 4 weeks.    Thank you, KLelan Pons [facility]  07/23/2018 11:37 AM

## 2018-07-25 ENCOUNTER — Other Ambulatory Visit: Payer: Medicare Other

## 2018-07-25 DIAGNOSIS — M2550 Pain in unspecified joint: Secondary | ICD-10-CM | POA: Diagnosis not present

## 2018-07-25 MED FILL — LOSARTAN POTASSIUM 50 MG TA: 50 | 90 days supply | Qty: 90 | Fill #0

## 2018-07-29 LAB — PTH, INTACT AND CALCIUM
Calcium: 9.7 mg/dL (ref 8.6–10.4)
PTH: 66 pg/mL — ABNORMAL HIGH (ref 14–64)

## 2018-07-31 NOTE — Progress Notes (Signed)
[name] [place], [place] [address] Phone: [phone] Subjective:   [name], am serving as a scribe for Dr. Hulan Saas.  CC: Back pain and left shoulder pain follow-up  KCM:[id number]  MARSHE SHRESTHA is a 73 y.o. female coming in with complaint of back pain. Pain has improved but never did go away completely.  Patient has been seen multiple times.  Patient did have work-up showing a MRI that did not show spinal stenosis.  Moderate to severe.  Patient did have an epidural in November that initially did give some benefit but now not as much.  Patient wants a more aggressive treatment plan at this point and would consider surgical intervention.  Feels that the back pain stops her from activity states and is worsening at this time.  Usually is the primary caregiver for her husband and finds that it is impossible to do at this point.  Patient continues to have pain in left shoulder. Pain is midshaft of humerus. Patient has dull achy pain. Does feel a locking sensation. Has history of adhesive capsulitis.  Also has history of severe osteoarthritic changes that have shown progression of the left shoulder at this point.  Patient is having so much pain that seems to be affecting daily activities.  Having difficulty even with dressing with it at this point.  Patient was having significant arm pain previously and ruled out any cardiac pathology with laboratory work-up.       Past Medical History:  Diagnosis Date  . Allergy    seasonal  . Ankylosing spondylitis ([place])   . Anxiety   . Arthritis   . Bell palsy   . CTS (carpal tunnel syndrome)   . Cystitis   . Diverticulitis   . Diverticulosis of colon   . Dry eye syndrome   . Exocrine pancreatic insufficiency   . Frozen shoulder   . GERD (gastroesophageal reflux disease)   . HLA B27 (HLA B27 positive)   . Hx of colonic polyps   . Hyperlipidemia    NMR 2005  . Hypertension   .  Hypothyroidism   . Incontinence   . Internal hemorrhoids   . Interstitial cystitis   . Kidney stone   . Menopause   . Osteoarthritis   . Osteopenia    [facility]  . S/P total hysterectomy and bilateral salpingo-oophorectomy 04/12/2014  . Sinusitis   . Sleep apnea    c-pap  . Subjective visual disturbance of both eyes 12/30/2013  . Tubular adenoma of colon   . Vitamin D deficiency   . Xerostomia    and Xeroophthalmia   Past Surgical History:  Procedure Laterality Date  . ABDOMINAL HYSTERECTOMY    . BLADDER SURGERY     for incontinence  . CARPAL TUNNEL RELEASE Left 12/22/2014   Procedure: LEFT CARPAL TUNNEL RELEASE;  Surgeon: Daryll Brod, MD;  Location: [facility];  Service: Orthopedics;  Laterality: Left;  . COLONOSCOPY W/ POLYPECTOMY     adenomatous poyp 04-2008  . colonscopy     tics 10-2002  . CYSTOSCOPY WITH RETROGRADE PYELOGRAM, URETEROSCOPY AND STENT PLACEMENT Left 09/01/2013   Procedure: CYSTOSCOPY WITH RETROGRADE PYELOGRAM, AND LEFT STENT PLACEMENT;  Surgeon: Molli Hazard, MD;  Location: [facility];  Service: Urology;  Laterality: Left;  . elevated LFTs     due to Elmiron  . FINGER SURGERY Left 2004   2nd finger  . G  1 P 1    . interstitial cystitis     with clot in catheter  . SHOULDER ARTHROSCOPY Left   . TOTAL ABDOMINAL HYSTERECTOMY W/ BILATERAL SALPINGOOPHORECTOMY     dysfunctional menses   Social History   Socioeconomic History  . Marital status: Married    Spouse name: Not on file  . Number of children: 1  . Years of education: BS  . Highest education level: Not on file  Occupational History  . Occupation: retired  . Occupation: Pharmacist, hospital    Comment: Kindergarten  Social Needs  . Financial resource strain: Not hard at all  . Food insecurity:    Worry: Never true    Inability: Never true  . Transportation needs:    Medical: No    Non-medical: No  Tobacco Use  . Smoking status: Never Smoker  . Smokeless  tobacco: Never Used  Substance and Sexual Activity  . Alcohol use: Not Currently    Comment:      . Drug use: No  . Sexual activity: Not Currently  Lifestyle  . Physical activity:    Days per week: 0 days    Minutes per session: 0 min  . Stress: Rather much  Relationships  . Social connections:    Talks on phone: More than three times a week    Gets together: More than three times a week    Attends religious service: Not on file    Active member of club or organization: Not on file    Attends meetings of clubs or organizations: Not on file    Relationship status: Married  Other Topics Concern  . Not on file  Social History Narrative   Lives   Caffeine use:    Married      Allergies  Allergen Reactions  . Bactrim [Sulfamethoxazole-Trimethoprim] Other (See Comments)  . Nitroglycerin Anaphylaxis    Hypotensive after NTG administration in ER during chest pain evaluation Pt states she almost died,  Pt states in a procedure done in 10/2016 she had no reaction to this medication, so not sure if it is an actual allergy.   . Pentosan Polysulfate Sodium     Elevated LFTs.......Marland Kitchen elmiron   . Promethazine Hcl Other (See Comments)    "jittery on the inside"  . Sulfamethoxazole-Trimethoprim Nausea Only    Unknown reaction per pt, Pt thinks she may have felt sick to her stomach.   . Lisinopril Cough   Family History  Problem Relation Age of Onset  . Coronary artery disease Mother   . Hypertension Mother   . Asthma Mother   . Dementia Mother   . Stroke Mother        mini cva  . Diabetes Father   . Cirrhosis Father        non alcoholic  . Cancer Sister        colon  . Colon cancer Sister 57  . Allergic rhinitis Sister   . Allergic rhinitis Sister   . Food Allergy Sister   . Cancer Paternal Aunt        2 aunts had renal cancer  . Coronary artery disease Maternal Grandfather   . Cancer Maternal Aunt        breast  . Diabetes Paternal Aunt   . Diabetes Paternal Grandmother    . Esophageal cancer Neg Hx   . Stomach cancer Neg Hx      Current Outpatient Medications (Cardiovascular):  Marland Kitchen  EPINEPHrine (EPIPEN 2-PAK) 0.3 mg/0.3 mL  IJ SOAJ injection, Use as directed for severe allergic reactions .  hydrochlorothiazide (MICROZIDE) 12.5 MG capsule, Take 1 capsule (12.5 mg total) by mouth daily. Marland Kitchen  losartan (COZAAR) 50 MG tablet, Take 1 tablet (50 mg total) by mouth daily. .  rosuvastatin (CRESTOR) 5 MG tablet, TAKE 1 TABLET (5 MG TOTAL) BY MOUTH DAILY.  Current Outpatient Medications (Respiratory):  .  cetirizine (ZYRTEC) 10 MG tablet, Take 10 mg by mouth daily.  Current Outpatient Medications (Analgesics):  .  acetaminophen (TYLENOL) 500 MG tablet, Take 500 mg by mouth every 4 (four) hours as needed for mild pain.  Marland Kitchen  aspirin 81 MG tablet, Take 81 mg by mouth daily.   .  meloxicam (MOBIC) 15 MG tablet, TAKE 1 TABLET (15 MG TOTAL) BY MOUTH DAILY AS NEEDED FOR PAIN. WITH FOOD  Current Outpatient Medications (Hematological):  .  vitamin B-12 (CYANOCOBALAMIN) 1000 MCG tablet, Take 1,000 mcg by mouth daily.   Current Outpatient Medications (Other):  Marland Kitchen  Ascorbic Acid (VITAMIN C) 1000 MG tablet, Take 1,000 mg by mouth daily.  .  blood glucose meter kit and supplies KIT, Dispense based on patient and insurance preference. Use up to four times daily as directed. prediabetes .  Cholecalciferol (VITAMIN D-3) 25 MCG (1000 UT) CAPS, Take 5,000 Units by mouth daily. .  Diclofenac Sodium 2 % SOLN, Place onto the skin as directed for pain as needed. .  diphenoxylate-atropine (LOMOTIL) 2.5-0.025 MG tablet, Take 1 tablet by mouth 4 (four) times daily as needed for diarrhea or loose stools. .  lipase/protease/amylase (CREON) 36000 UNITS CPEP capsule, Take 2 capsules (72,000 Units total) by mouth 3 (three) times daily with meals. 1 capsule with snacks (max 8 capsules daily) .  Olopatadine HCl (PAZEO) 0.7 % SOLN, Place 1 drop into both eyes daily. .  Omega-3 Fatty Acids (FISH OIL  BURP-LESS) 1000 MG CAPS, Takes 1000 mg BID .  omeprazole (PRILOSEC) 40 MG capsule, TAKE 1 CAPSULE (40 MG TOTAL) BY MOUTH DAILY. .  orphenadrine (NORFLEX) 100 MG tablet, Take 1 tablet (100 mg total) by mouth 2 (two) times daily. Vladimir Faster Glycol-Propyl Glycol (SYSTANE OP), Apply 1 drop to eye as needed. Apply to both eyes as needed .  vitamin E 400 UNIT capsule, Take 400 Units by mouth daily.    Past medical history, social, surgical and family history all reviewed in electronic medical record.  No pertanent information unless stated regarding to the chief complaint.   Review of Systems:  No headache, visual changes, nausea, vomiting, diarrhea, constipation, dizziness, abdominal pain, skin rash, fevers, chills, night sweats, weight loss, swollen lymph nodes, body aches, joint swelling,  chest pain, shortness of breath, mood changes.  Positive muscle aches  Objective  Blood pressure 108/72, pulse 76, height 5' 2"  (1.575 m), weight 164 lb (74.4 kg), SpO2 96 %.   General: No apparent distress alert and oriented x3 mood and affect normal, dressed appropriately.  HEENT: Pupils equal, extraocular movements intact  Respiratory: Patient's speak in full sentences and does not appear short of breath  Cardiovascular: No lower extremity edema, non tender, no erythema  Skin: Warm dry intact with no signs of infection or rash on extremities or on axial skeleton.  Abdomen: Soft nontender  Neuro: Cranial nerves II through XII are intact, neurovascularly intact in all extremities with 2+ DTRs and 2+ pulses.  Lymph: No lymphadenopathy of posterior or anterior cervical chain or axillae bilaterally.  Gait mild antalgic MSK:  tender with limited range of  motion and stability and mild limited strength and tone of , elbows, wrist, hip, knee and ankles bilaterally.   Left shoulder shows the patient has crepitus in all range of motion.  4 out of 5 strength with some weakness noted.  Limited range motion in all  planes findings 5 to 10 degrees.  Positive drop arm.  Back exam with significant loss of lordosis.  Pain with straight leg test.  Patient has difficulty neurovascular intact distally.  Patient unable to extend greater than 5 degrees of the back with out severe amount of pain.      Impression and Recommendations:      The above documentation has been reviewed and is accurate and complete Lyndal Pulley, DO       Note: This dictation was prepared with Dragon dictation along with smaller phrase technology. Any transcriptional errors that result from this process are unintentional.

## 2018-08-01 ENCOUNTER — Encounter: Payer: Self-pay | Admitting: Family Medicine

## 2018-08-01 ENCOUNTER — Ambulatory Visit (INDEPENDENT_AMBULATORY_CARE_PROVIDER_SITE_OTHER): Payer: Medicare Other | Admitting: Family Medicine

## 2018-08-01 VITALS — BP 108/72 | HR 76 | Ht 62.0 in | Wt 164.0 lb

## 2018-08-01 DIAGNOSIS — M5416 Radiculopathy, lumbar region: Secondary | ICD-10-CM

## 2018-08-01 DIAGNOSIS — M459 Ankylosing spondylitis of unspecified sites in spine: Secondary | ICD-10-CM | POA: Diagnosis not present

## 2018-08-01 DIAGNOSIS — M48062 Spinal stenosis, lumbar region with neurogenic claudication: Secondary | ICD-10-CM

## 2018-08-01 DIAGNOSIS — M19012 Primary osteoarthritis, left shoulder: Secondary | ICD-10-CM

## 2018-08-01 NOTE — Assessment & Plan Note (Signed)
Patient has failed all conservative therapy at this time.  Does not want any more injections I do not feel that would be beneficial.  Patient is having some radicular pain going down the arm I think secondary to her trying to compensate.  I do feel due to the pain that is never completely away that surgical intervention with a possible replacement is a possibility and encourage patient to follow-up with orthopedic surgeon specialist to discuss.

## 2018-08-01 NOTE — Patient Instructions (Signed)
Good to see you  Ice is your friend heat 10 minutes then ice 10 minutes repeat every 2 hours.  Stay active Dr. Tamera Punt or Dr. Mardelle Matte but will refer to chander now Will also get you in with neurosurgery  I am here if you have questions

## 2018-08-01 NOTE — Assessment & Plan Note (Signed)
Patient does have more than neurogenic claudication.  Severe spinal stenosis.  Has failed all conservative therapy and does not want any more epidurals.  Encourage patient to see neurosurgery to discuss other options.  Patient feels that this is the only way she is gone to get better at this time.  Patient does have the past medical history significant for ankylosing spondylitis x-rays did not show significant erosive changes or MRI.

## 2018-08-12 ENCOUNTER — Telehealth: Payer: Self-pay | Admitting: Cardiology

## 2018-08-12 ENCOUNTER — Telehealth: Payer: Self-pay | Admitting: Pharmacist

## 2018-08-12 NOTE — Telephone Encounter (Signed)
Left the pt a message to call the office back, to endorse recommendations per Dr. Nelson. 

## 2018-08-12 NOTE — Telephone Encounter (Signed)
Pt states that used to take a long time ago. She is pending surgery for shoulder pain. She has an appt on Friday. She is wanting to take until she sees physician on Friday. Advised from a cardiovascular prospective we recommend lowest effective dose for short duration possible. She requests prescription for extra strength and I advised that will need to come from managing physician, but if they feel she needs she can take lowest effective dose for shortest time possible. She states understanding.

## 2018-08-12 NOTE — Telephone Encounter (Signed)
Dr. Meda Coffee, pt is wanting to know if she can take Ibuprofen for pain. Please advise.

## 2018-08-12 NOTE — Telephone Encounter (Signed)
New Message   PT is wondering if she can take extra strength Ibuprofen for pain. Please call

## 2018-08-12 NOTE — Telephone Encounter (Signed)
He has she can but not more than 400 mg a day and not every day,

## 2018-08-12 NOTE — Telephone Encounter (Signed)
Patient is calling to ask if it is ok for her take Ibuprofen extra strength. / tg

## 2018-08-13 NOTE — Telephone Encounter (Signed)
Spoke with the pts Spouse (on DPR) and informed him that per Dr Meda Coffee, the pt can take Ibuprofen, but no more than 400 mg a day, and not every day. Per the husband, she is having shoulder joint and muscular pain, so that's why she was inquiring about the Ibuprofen.  Advised the pts Husband that she could try icing her shoulder for 20 mins, and then alternate with heat 20 mins.  Advised the pts husband that OTC topical Icy Hot can also safely be used as needed for muscle/joint pain, follow as directed.  Advised the pts spouse that if she continues with this pain, she may want to refer to her PCP for further evaluation and management. Spouse verbalized understanding and agrees with this plan.  Husband states he will endorse these recommendations to the pt when she returns home.

## 2018-08-15 DIAGNOSIS — M66322 Spontaneous rupture of flexor tendons, left upper arm: Secondary | ICD-10-CM | POA: Diagnosis not present

## 2018-08-15 DIAGNOSIS — M19012 Primary osteoarthritis, left shoulder: Secondary | ICD-10-CM | POA: Diagnosis not present

## 2018-08-16 ENCOUNTER — Other Ambulatory Visit: Payer: Self-pay | Admitting: Orthopedic Surgery

## 2018-08-16 DIAGNOSIS — G8929 Other chronic pain: Secondary | ICD-10-CM

## 2018-08-16 DIAGNOSIS — M25512 Pain in left shoulder: Principal | ICD-10-CM

## 2018-08-16 DIAGNOSIS — M19012 Primary osteoarthritis, left shoulder: Secondary | ICD-10-CM | POA: Diagnosis not present

## 2018-08-16 MED FILL — diazePAM 5 MG TABS: 5 | 2 days supply | Qty: 2 | Fill #0

## 2018-08-16 MED FILL — MELOXICAM 15 MG TABLET: 15 | 30 days supply | Qty: 30 | Fill #0

## 2018-08-19 ENCOUNTER — Ambulatory Visit
Admission: RE | Admit: 2018-08-19 | Discharge: 2018-08-19 | Disposition: A | Discharge status: Home / Self Care | Payer: Medicare Other | Source: Ambulatory Visit | Attending: Orthopedic Surgery | Admitting: Orthopedic Surgery

## 2018-08-19 DIAGNOSIS — M19012 Primary osteoarthritis, left shoulder: Secondary | ICD-10-CM | POA: Diagnosis not present

## 2018-08-19 DIAGNOSIS — M25512 Pain in left shoulder: Principal | ICD-10-CM

## 2018-08-19 DIAGNOSIS — G8929 Other chronic pain: Secondary | ICD-10-CM

## 2018-08-19 MED FILL — OMEPRAZOLE 40 MG CPDR: 40 | 30 days supply | Qty: 30 | Fill #2

## 2018-08-20 ENCOUNTER — Ambulatory Visit (INDEPENDENT_AMBULATORY_CARE_PROVIDER_SITE_OTHER): Payer: Medicare Other | Admitting: Pharmacist

## 2018-08-20 VITALS — BP 118/78 | HR 65

## 2018-08-20 DIAGNOSIS — I10 Essential (primary) hypertension: Secondary | ICD-10-CM

## 2018-08-20 LAB — BASIC METABOLIC PANEL
BUN/Creatinine Ratio: 16 (ref 12–28)
BUN: 18 mg/dL (ref 8–27)
CO2: 22 mmol/L (ref 20–29)
Calcium: 9.9 mg/dL (ref 8.7–10.3)
Chloride: 103 mmol/L (ref 96–106)
Creatinine, Ser: 1.1 mg/dL — ABNORMAL HIGH (ref 0.57–1.00)
GFR calc Af Amer: 57 mL/min/{1.73_m2} — ABNORMAL LOW (ref >59)
GFR calc non Af Amer: 50 mL/min/{1.73_m2} — ABNORMAL LOW (ref >59)
Glucose: 91 mg/dL (ref 65–99)
Potassium: 4.4 mmol/L (ref 3.5–5.2)
Sodium: 142 mmol/L (ref 134–144)

## 2018-08-20 NOTE — Progress Notes (Signed)
Patient ID: [name]                 DOB: September 16, 1945                      MRN: [id number]     HPI: [name] is a 73 y.o. female patient of Dr. Meda Coffee who presents today for hypertension evaluation. PMH significant for hypertension, hyperlipidemia who was previously seen in our office in 2014 for concerns of chest pain and underwent an exercise nuclear stress test that showed LVEF of 70% and no prior infarct or ischemia. At her most recent pharmacy clinic visit on 12/24 her losartan was increased to 46m. She has complained of leg cramps, possibly due to low K.   Patient presents today with her daughter. She is in good spirits, however she has been in a lot of pain from her shoulder and now her back/hip. Her Dr. Has prescribed her a 10 day course of meloxicam. Patient and daughter asking about what she should be taking as she knows there is risk with NSAIDs (meloxicam vs IBU). She takes tylenol as well, with little relief. She does use dicolenfac topical twice a day with temporary relief. We dicussed that she can use up to 4 times a day. She is also icing and using icy-hot. She is willing to have surgery if the doctor says she needs it, but she needs pain relief for now.  Current HTN meds:  HCTZ 12.586mdaily Losartan 5088maily  Previously tried: lisinopril, Toprol - bradycardia   BP goal: <130/80  Family History: Allergic rhinitis in her sister and sister; Asthma in her mother; Cancer in her maternal aunt, paternal aunt, and sister; Cirrhosis in her father; Colon cancer (age of onset: 67)17n her sister; Coronary artery disease in her maternal grandfather and mother; Dementia in her mother; Diabetes in her father, paternal aunt, and paternal grandmother; Food Allergy in her sister; Hypertension in her mother; Stroke in her mother.   Social History: denies tobacco and alcohol  Diet: Most meals from home, when does eat out from [employer] not eat salad due to GI  complications. She has tried to increase her water intake. She does drink sweet and unsweet tea. She does drink cola, but has tried to cut back.   Exercise: She has been working in the yard prior to her back issues.   Home BP readings: 98/64, 104/78, 120/80, 136/86 (no meds), 120/75, 115/62, 116/75 HR 64-84  Wt Readings from Last 3 Encounters:  08/01/18 164 lb (74.4 kg)  07/18/18 161 lb (73 kg)  07/03/18 161 lb 1.9 oz (73.1 kg)   BP Readings from Last 3 Encounters:  08/20/18 118/78  08/01/18 108/72  07/23/18 (!) 158/82   Pulse Readings from Last 3 Encounters:  08/20/18 65  08/01/18 76  07/23/18 63    Renal function: CrCl cannot be calculated (Patient's most recent lab result is older than the maximum 21 days allowed.).  Past Medical History:  Diagnosis Date  . Allergy    seasonal  . [place] ([place] . Anxiety   . Arthritis   . Bell palsy   . CTS (carpal tunnel syndrome)   . Cystitis   . Diverticulitis   . Diverticulosis of colon   . Dry eye syndrome   . Exocrine pancreatic insufficiency   . Frozen shoulder   . GERD (gastroesophageal reflux disease)   . HLA B27 (HLA B27  positive)   . Hx of colonic polyps   . Hyperlipidemia    NMR 2005  . Hypertension   . Hypothyroidism   . Incontinence   . Internal hemorrhoids   . Interstitial cystitis   . Kidney stone   . Menopause   . Osteoarthritis   . Osteopenia    [facility]  . S/P total hysterectomy and bilateral salpingo-oophorectomy 04/12/2014  . Sinusitis   . Sleep apnea    c-pap  . Subjective visual disturbance of both eyes 12/30/2013  . Tubular adenoma of colon   . Vitamin D deficiency   . Xerostomia    and Xeroophthalmia    Current Outpatient Medications on File Prior to Visit  Medication Sig Dispense Refill  . acetaminophen (TYLENOL) 500 MG tablet Take 500 mg by mouth every 4 (four) hours as needed for mild pain.     . Ascorbic Acid (VITAMIN C) 1000 MG tablet Take  1,000 mg by mouth daily.     Marland Kitchen aspirin 81 MG tablet Take 81 mg by mouth daily.      . blood glucose meter kit and supplies KIT Dispense based on patient and insurance preference. Use up to four times daily as directed. prediabetes 1 each 0  . cetirizine (ZYRTEC) 10 MG tablet Take 10 mg by mouth daily.    . Cholecalciferol (VITAMIN D-3) 25 MCG (1000 UT) CAPS Take 5,000 Units by mouth daily.    . Diclofenac Sodium 2 % SOLN Place onto the skin as directed for pain as needed.    . diphenoxylate-atropine (LOMOTIL) 2.5-0.025 MG tablet Take 1 tablet by mouth 4 (four) times daily as needed for diarrhea or loose stools. 90 tablet 2  . EPINEPHrine (EPIPEN 2-PAK) 0.3 mg/0.3 mL IJ SOAJ injection Use as directed for severe allergic reactions 2 Device 2  . hydrochlorothiazide (MICROZIDE) 12.5 MG capsule Take 1 capsule (12.5 mg total) by mouth daily. 90 capsule 1  . lipase/protease/amylase (CREON) 36000 UNITS CPEP capsule Take 2 capsules (72,000 Units total) by mouth 3 (three) times daily with meals. 1 capsule with snacks (max 8 capsules daily) 240 capsule 0  . losartan (COZAAR) 50 MG tablet Take 1 tablet (50 mg total) by mouth daily. 90 tablet 1  . meloxicam (MOBIC) 15 MG tablet TAKE 1 TABLET (15 MG TOTAL) BY MOUTH DAILY AS NEEDED FOR PAIN. WITH FOOD 90 tablet 2  . Olopatadine HCl (PAZEO) 0.7 % SOLN Place 1 drop into both eyes daily. 1 Bottle 5  . Omega-3 Fatty Acids (FISH OIL BURP-LESS) 1000 MG CAPS Takes 1000 mg BID 60 capsule 0  . omeprazole (PRILOSEC) 40 MG capsule TAKE 1 CAPSULE (40 MG TOTAL) BY MOUTH DAILY. 30 capsule 5  . orphenadrine (NORFLEX) 100 MG tablet Take 1 tablet (100 mg total) by mouth 2 (two) times daily. 30 tablet 0  . Polyethyl Glycol-Propyl Glycol (SYSTANE OP) Apply 1 drop to eye as needed. Apply to both eyes as needed    . rosuvastatin (CRESTOR) 5 MG tablet TAKE 1 TABLET (5 MG TOTAL) BY MOUTH DAILY. 90 tablet 3  . vitamin B-12 (CYANOCOBALAMIN) 1000 MCG tablet Take 1,000 mcg by mouth daily.      . vitamin E 400 UNIT capsule Take 400 Units by mouth daily.     No current facility-administered medications on file prior to visit.     Allergies  Allergen Reactions  . Bactrim [Sulfamethoxazole-Trimethoprim] Other (See Comments)  . Nitroglycerin Anaphylaxis    Hypotensive after  NTG administration in ER during chest pain evaluation Pt states she almost died,  Pt states in a procedure done in 10/2016 she had no reaction to this medication, so not sure if it is an actual allergy.   . Pentosan Polysulfate Sodium     Elevated LFTs.......Marland Kitchen elmiron   . Promethazine Hcl Other (See Comments)    "jittery on the inside"  . Sulfamethoxazole-Trimethoprim Nausea Only    Unknown reaction per pt, Pt thinks she may have felt sick to her stomach.   . Lisinopril Cough    Blood pressure 118/78, pulse 65, SpO2 99 %.   Assessment/Plan: Hypertension: BP today in clinic is at goal of <130/80. Her home readings match clinic readings. Continue losartan 34m daily and HCTZ 12.569mdaily. BMP looks stable with increased dose. Follow up with Dr. NeMeda Coffees advised.  Advised patient that there is not a good answer as far as what NSAID is best. See My chart message for conversation.  Thank you, MeRamond DialPharm.D, [id number]. [address] StreetGrBadger[place] [id number]: [phone]: [phone]/21/2020 10:03 AM

## 2018-08-20 NOTE — Patient Instructions (Signed)
Continue taking losartan 50mg  daily and hydrochlorothiazide 12.5mg  daily.  Call us at [phone] with any questions or concerns.

## 2018-08-23 DIAGNOSIS — M19212 Secondary osteoarthritis, left shoulder: Secondary | ICD-10-CM | POA: Diagnosis not present

## 2018-08-27 DIAGNOSIS — M5416 Radiculopathy, lumbar region: Secondary | ICD-10-CM | POA: Diagnosis not present

## 2018-08-27 MED FILL — traMADol HCL 50 MG TABS: 50 | 10 days supply | Qty: 60 | Fill #0

## 2018-09-03 DIAGNOSIS — M5416 Radiculopathy, lumbar region: Secondary | ICD-10-CM | POA: Diagnosis not present

## 2018-09-06 DIAGNOSIS — M5416 Radiculopathy, lumbar region: Secondary | ICD-10-CM | POA: Diagnosis not present

## 2018-09-09 DIAGNOSIS — M5416 Radiculopathy, lumbar region: Secondary | ICD-10-CM | POA: Diagnosis not present

## 2018-09-09 MED FILL — GABAPENTIN 300 MG CAPSULE: 300 | 30 days supply | Qty: 30 | Fill #0

## 2018-09-10 DIAGNOSIS — M5416 Radiculopathy, lumbar region: Secondary | ICD-10-CM | POA: Diagnosis not present

## 2018-09-12 DIAGNOSIS — M5416 Radiculopathy, lumbar region: Secondary | ICD-10-CM | POA: Diagnosis not present

## 2018-09-17 DIAGNOSIS — M5416 Radiculopathy, lumbar region: Secondary | ICD-10-CM | POA: Diagnosis not present

## 2018-09-18 ENCOUNTER — Ambulatory Visit (INDEPENDENT_AMBULATORY_CARE_PROVIDER_SITE_OTHER): Payer: Medicare Other | Admitting: Allergy and Immunology

## 2018-09-18 ENCOUNTER — Encounter: Payer: Self-pay | Admitting: Allergy and Immunology

## 2018-09-18 VITALS — BP 124/80 | HR 82 | Temp 97.8°F | Resp 16 | Ht 62.0 in | Wt 165.8 lb

## 2018-09-18 DIAGNOSIS — J3089 Other allergic rhinitis: Secondary | ICD-10-CM | POA: Diagnosis not present

## 2018-09-18 DIAGNOSIS — R040 Epistaxis: Secondary | ICD-10-CM

## 2018-09-18 DIAGNOSIS — H1013 Acute atopic conjunctivitis, bilateral: Secondary | ICD-10-CM

## 2018-09-18 DIAGNOSIS — Z91018 Allergy to other foods: Secondary | ICD-10-CM

## 2018-09-18 MED ORDER — AZELASTINE HCL 0.1 % NA SOLN: NASAL | 5 refills | Status: DC

## 2018-09-18 MED FILL — AZELASTINE HCL 137 MCG SPRY: 0.1 | 25 days supply | Qty: 30 | Fill #0

## 2018-09-18 NOTE — Assessment & Plan Note (Signed)
   Continue aeroallergen avoidance measures and levocetirizine or cetirizine.  To avoid diminishing benefit with daily use (tachyphylaxis) of second generation antihistamine, consider alternating every few months between fexofenadine (Allegra) and levocetirizine (Xyzal).  A prescription has been provided for azelastine nasal spray, 1-2 sprays per nostril 2 times daily as needed. Proper nasal spray technique has been discussed and demonstrated.   Nasal saline spray (i.e., Simply Saline) or nasal saline lavage (i.e., NeilMed) is recommended as needed and prior to medicated nasal sprays.

## 2018-09-18 NOTE — Assessment & Plan Note (Addendum)
   Nasal steroid sprays will be avoided for now.  Proper technique for stanching epistaxis has been discussed and demonstrated.  Nasal saline spray and/or nasal saline gel is recommended to moisturize nasal mucosa.  Continue increasing the humidity of the CPAP machine..  During epistaxis, if needed, oxymetazoline (Afrin) nasal sparay may be applied to a cotton ball to help stanch the blood flow.  If this problem persists or progresses, otolaryngology evaluation may be warranted.

## 2018-09-18 NOTE — Assessment & Plan Note (Signed)
   Continue avoidance of beans, pork, peanuts, and tree nuts and have access to epinephrine autoinjectors.

## 2018-09-18 NOTE — Progress Notes (Signed)
Follow-up Note  RE: [name] MRN: [id number] DOB: September 28, 1945 Date of Office Visit: 09/18/2018  Primary care provider: Binnie Rail, MD Referring provider: Binnie Rail, MD  History of present illness: [name] is a 73 y.o. female with allergic rhinoconjunctivitis and food allergy.  She was previously seen in this clinic for her initial evaluation on April 18, 2018.  She reports that she has been taking cetirizine daily in the interval since her previous visit.  She has not required fluticasone nasal spray.  She complains epistaxis which has occurred on multiple occasions recently.  The blood flows primarily from the right nostril.  She believes that it may be due to her CPAP which has been drying out her nose and mouth.  She will be seeing the CPAP prescriber later today to inquire about increasing the setting for humidity of the CPAP.  Assessment and plan: Perennial and seasonal allergic rhinitis  Continue aeroallergen avoidance measures and levocetirizine or cetirizine.  To avoid diminishing benefit with daily use (tachyphylaxis) of second generation antihistamine, consider alternating every few months between fexofenadine (Allegra) and levocetirizine (Xyzal).  A prescription has been provided for azelastine nasal spray, 1-2 sprays per nostril 2 times daily as needed. Proper nasal spray technique has been discussed and demonstrated.   Nasal saline spray (i.e., Simply Saline) or nasal saline lavage (i.e., NeilMed) is recommended as needed and prior to medicated nasal sprays.  Epistaxis  Nasal steroid sprays will be avoided for now.  Proper technique for stanching epistaxis has been discussed and demonstrated.  Nasal saline spray and/or nasal saline gel is recommended to moisturize nasal mucosa.  Continue increasing the humidity of the CPAP machine..  During epistaxis, if needed, oxymetazoline (Afrin) nasal sparay may be applied to a cotton ball to help stanch  the blood flow.  If this problem persists or progresses, otolaryngology evaluation may be warranted.   Food allergy  Continue avoidance of beans, pork, peanuts, and tree nuts and have access to epinephrine autoinjectors.   Meds ordered this encounter  Medications  . azelastine (ASTELIN) 0.1 % nasal spray    Sig: One to two sprays each nostril twice a day as needed.    Dispense:  30 mL    Refill:  5    Physical examination: Blood pressure 124/80, pulse 82, temperature 97.8 F (36.6 C), temperature source Oral, resp. rate 16, height 5' 2"  (1.575 m), weight 165 lb 12.6 oz (75.2 kg), SpO2 98 %.  General: Alert, interactive, in no acute distress. HEENT: TMs pearly gray, turbinates mildly edematous without discharge, post-pharynx mildly erythematous. Neck: Supple without lymphadenopathy. Lungs: Clear to auscultation without wheezing, rhonchi or rales. CV: Normal S1, S2 without murmurs. Skin: Warm and dry, without lesions or rashes.  The following portions of the patient's history were reviewed and updated as appropriate: allergies, current medications, past family history, past medical history, past social history, past surgical history and problem list.  Allergies as of 09/18/2018      Reactions   Bactrim [sulfamethoxazole-trimethoprim] Other (See Comments)   Nitroglycerin Anaphylaxis   Hypotensive after NTG administration in ER during chest pain evaluation Pt states she almost died,  Pt states in a procedure done in 10/2016 she had no reaction to this medication, so not sure if it is an actual allergy.    Pentosan Polysulfate Sodium    Elevated LFTs.......Marland Kitchen elmiron    Promethazine Hcl Other (See Comments)   "jittery on the inside"   Sulfamethoxazole-trimethoprim Nausea  Only   Unknown reaction per pt, Pt thinks she may have felt sick to her stomach.    Lisinopril Cough      Medication List       Accurate as of September 18, 2018 12:59 PM. Always use your most recent med list.         acetaminophen 500 MG tablet Commonly known as:  TYLENOL Take 500 mg by mouth every 4 (four) hours as needed for mild pain.   aspirin 81 MG tablet Take 81 mg by mouth daily.   azelastine 0.1 % nasal spray Commonly known as:  ASTELIN One to two sprays each nostril twice a day as needed.   blood glucose meter kit and supplies Kit Dispense based on patient and insurance preference. Use up to four times daily as directed. prediabetes   cetirizine 10 MG tablet Commonly known as:  ZYRTEC Take 10 mg by mouth daily.   Diclofenac Sodium 2 % Soln Place onto the skin as directed for pain as needed.   diphenoxylate-atropine 2.5-0.025 MG tablet Commonly known as:  LOMOTIL Take 1 tablet by mouth 4 (four) times daily as needed for diarrhea or loose stools.   EPINEPHrine 0.3 mg/0.3 mL Soaj injection Commonly known as:  EPIPEN 2-PAK Use as directed for severe allergic reactions   FISH OIL BURP-LESS 1000 MG Caps Takes 1000 mg BID   hydrochlorothiazide 12.5 MG capsule Commonly known as:  MICROZIDE Take 1 capsule (12.5 mg total) by mouth daily.   lipase/protease/amylase 36000 UNITS Cpep capsule Commonly known as:  CREON Take 2 capsules (72,000 Units total) by mouth 3 (three) times daily with meals. 1 capsule with snacks (max 8 capsules daily)   losartan 50 MG tablet Commonly known as:  COZAAR Take 1 tablet (50 mg total) by mouth daily.   meloxicam 15 MG tablet Commonly known as:  MOBIC TAKE 1 TABLET (15 MG TOTAL) BY MOUTH DAILY AS NEEDED FOR PAIN. WITH FOOD   Olopatadine HCl 0.7 % Soln Commonly known as:  PAZEO Place 1 drop into both eyes daily.   omeprazole 40 MG capsule Commonly known as:  PRILOSEC TAKE 1 CAPSULE (40 MG TOTAL) BY MOUTH DAILY.   rosuvastatin 5 MG tablet Commonly known as:  CRESTOR TAKE 1 TABLET (5 MG TOTAL) BY MOUTH DAILY.   SYSTANE OP Apply 1 drop to eye as needed. Apply to both eyes as needed   traMADol 50 MG tablet Commonly known as:   ULTRAM   vitamin B-12 1000 MCG tablet Commonly known as:  CYANOCOBALAMIN Take 1,000 mcg by mouth daily.   vitamin C 1000 MG tablet Take 1,000 mg by mouth daily.   Vitamin D-3 25 MCG (1000 UT) Caps Take 5,000 Units by mouth daily.   vitamin E 400 UNIT capsule Take 400 Units by mouth daily.       Allergies  Allergen Reactions  . Bactrim [Sulfamethoxazole-Trimethoprim] Other (See Comments)  . Nitroglycerin Anaphylaxis    Hypotensive after NTG administration in ER during chest pain evaluation Pt states she almost died,  Pt states in a procedure done in 10/2016 she had no reaction to this medication, so not sure if it is an actual allergy.   . Pentosan Polysulfate Sodium     Elevated LFTs.......Marland Kitchen elmiron   . Promethazine Hcl Other (See Comments)    "jittery on the inside"  . Sulfamethoxazole-Trimethoprim Nausea Only    Unknown reaction per pt, Pt thinks she may have felt sick to her stomach.   . Lisinopril  Cough   Review of systems: Review of systems negative except as noted in HPI / PMHx or noted below: Constitutional: Negative.  HENT: Negative.   Eyes: Negative.  Respiratory: Negative.   Cardiovascular: Negative.  Gastrointestinal: Negative.  Genitourinary: Negative.  Musculoskeletal: Negative.  Neurological: Negative.  Endo/Heme/Allergies: Negative.  Cutaneous: Negative.  Past Medical History:  Diagnosis Date  . Allergy    seasonal  . Ankylosing spondylitis ([place])   . Anxiety   . Arthritis   . Bell palsy   . CTS (carpal tunnel syndrome)   . Cystitis   . Diverticulitis   . Diverticulosis of colon   . Dry eye syndrome   . Exocrine pancreatic insufficiency   . Frozen shoulder   . GERD (gastroesophageal reflux disease)   . HLA B27 (HLA B27 positive)   . Hx of colonic polyps   . Hyperlipidemia    NMR 2005  . Hypertension   . Hypothyroidism   . Incontinence   . Internal hemorrhoids   . Interstitial cystitis   . Kidney stone   . Menopause   .  Osteoarthritis   . Osteopenia    [facility]  . S/P total hysterectomy and bilateral salpingo-oophorectomy 04/12/2014  . Sinusitis   . Sleep apnea    c-pap  . Subjective visual disturbance of both eyes 12/30/2013  . Tubular adenoma of colon   . Vitamin D deficiency   . Xerostomia    and Xeroophthalmia    Family History  Problem Relation Age of Onset  . Coronary artery disease Mother   . Hypertension Mother   . Asthma Mother   . Dementia Mother   . Stroke Mother        mini cva  . Diabetes Father   . Cirrhosis Father        non alcoholic  . Cancer Sister        colon  . Colon cancer Sister 4  . Allergic rhinitis Sister   . Allergic rhinitis Sister   . Food Allergy Sister   . Cancer Paternal Aunt        2 aunts had renal cancer  . Coronary artery disease Maternal Grandfather   . Cancer Maternal Aunt        breast  . Diabetes Paternal Aunt   . Diabetes Paternal Grandmother   . Esophageal cancer Neg Hx   . Stomach cancer Neg Hx     Social History   Socioeconomic History  . Marital status: Married    Spouse name: Not on file  . Number of children: 1  . Years of education: BS  . Highest education level: Not on file  Occupational History  . Occupation: retired  . Occupation: Pharmacist, hospital    Comment: Kindergarten  Social Needs  . Financial resource strain: Not hard at all  . Food insecurity:    Worry: Never true    Inability: Never true  . Transportation needs:    Medical: No    Non-medical: No  Tobacco Use  . Smoking status: Never Smoker  . Smokeless tobacco: Never Used  Substance and Sexual Activity  . Alcohol use: Not Currently    Comment:      . Drug use: No  . Sexual activity: Not Currently  Lifestyle  . Physical activity:    Days per week: 0 days    Minutes per session: 0 min  . Stress: Rather much  Relationships  . Social connections:  Talks on phone: More than three times a week    Gets together: More than three times a  week    Attends religious service: Not on file    Active member of club or organization: Not on file    Attends meetings of clubs or organizations: Not on file    Relationship status: Married  . Intimate partner violence:    Fear of current or ex partner: No    Emotionally abused: No    Physically abused: No    Forced sexual activity: No  Other Topics Concern  . Not on file  Social History Narrative   Lives   Caffeine use:    Married       I appreciate the opportunity to take part in [place] care. Please do not hesitate to contact me with questions.  Sincerely,   R. Edgar Frisk, MD

## 2018-09-18 NOTE — Patient Instructions (Addendum)
Perennial and seasonal allergic rhinitis  Continue aeroallergen avoidance measures and levocetirizine or cetirizine.  To avoid diminishing benefit with daily use (tachyphylaxis) of second generation antihistamine, consider alternating every few months between fexofenadine (Allegra) and levocetirizine (Xyzal).  A prescription has been provided for azelastine nasal spray, 1-2 sprays per nostril 2 times daily as needed. Proper nasal spray technique has been discussed and demonstrated.   Nasal saline spray (i.e., Simply Saline) or nasal saline lavage (i.e., NeilMed) is recommended as needed and prior to medicated nasal sprays.  Epistaxis  Nasal steroid sprays will be avoided for now.  Proper technique for stanching epistaxis has been discussed and demonstrated.  Nasal saline spray and/or nasal saline gel is recommended to moisturize nasal mucosa.  Continue increasing the humidity of the CPAP machine..  During epistaxis, if needed, oxymetazoline (Afrin) nasal sparay may be applied to a cotton ball to help stanch the blood flow.  If this problem persists or progresses, otolaryngology evaluation may be warranted.   Food allergy  Continue avoidance of beans, pork, peanuts, and tree nuts and have access to epinephrine autoinjectors.   Return in about 1 year (around 09/19/2019), or if symptoms worsen or fail to improve.

## 2018-09-19 DIAGNOSIS — M5416 Radiculopathy, lumbar region: Secondary | ICD-10-CM | POA: Diagnosis not present

## 2018-09-19 MED FILL — EPINEPHRINE 0.3 MG AUTO-INJ: 0.3 | 2 days supply | Qty: 2 | Fill #1

## 2018-09-23 ENCOUNTER — Other Ambulatory Visit: Payer: Self-pay | Admitting: Cardiology

## 2018-09-23 MED FILL — HYDROCHLOROTHIAZIDE 12.5 MG: 12.5 | 90 days supply | Qty: 90 | Fill #0

## 2018-09-23 MED FILL — OMEPRAZOLE 40 MG CPDR: 40 | 30 days supply | Qty: 30 | Fill #3

## 2018-10-01 DIAGNOSIS — M5416 Radiculopathy, lumbar region: Secondary | ICD-10-CM | POA: Diagnosis not present

## 2018-10-04 DIAGNOSIS — M5416 Radiculopathy, lumbar region: Secondary | ICD-10-CM | POA: Diagnosis not present

## 2018-10-04 DIAGNOSIS — M19212 Secondary osteoarthritis, left shoulder: Secondary | ICD-10-CM | POA: Diagnosis not present

## 2018-10-08 ENCOUNTER — Telehealth: Payer: Self-pay | Admitting: Internal Medicine

## 2018-10-08 ENCOUNTER — Other Ambulatory Visit: Payer: Self-pay | Admitting: Internal Medicine

## 2018-10-08 DIAGNOSIS — M5416 Radiculopathy, lumbar region: Secondary | ICD-10-CM | POA: Diagnosis not present

## 2018-10-08 NOTE — Telephone Encounter (Signed)
Pt states that she spoke with [facility] and they will be calling to get new script for 90days instead of 30 days for the medication

## 2018-10-08 NOTE — Telephone Encounter (Signed)
3 month rx sent for Creon. Patient needs office visit for further refills.

## 2018-10-15 ENCOUNTER — Other Ambulatory Visit: Payer: Self-pay

## 2018-10-15 ENCOUNTER — Ambulatory Visit (INDEPENDENT_AMBULATORY_CARE_PROVIDER_SITE_OTHER): Payer: Medicare Other | Admitting: Internal Medicine

## 2018-10-15 ENCOUNTER — Encounter: Payer: Self-pay | Admitting: Internal Medicine

## 2018-10-15 VITALS — BP 114/64 | HR 78 | Temp 98.5°F | Resp 16 | Ht 62.0 in | Wt 179.0 lb

## 2018-10-15 DIAGNOSIS — J0100 Acute maxillary sinusitis, unspecified: Secondary | ICD-10-CM | POA: Diagnosis not present

## 2018-10-15 MED ORDER — HYDROCODONE-HOMATROPINE 5-1.5 MG/5ML PO SYRP: 5.0000 mL | ORAL_SOLUTION | Freq: Three times a day (TID) | ORAL | 0 refills | Status: DC | PRN

## 2018-10-15 MED ORDER — ONDANSETRON HCL 4 MG PO TABS: 4.0000 mg | ORAL_TABLET | Freq: Three times a day (TID) | ORAL | 0 refills | Status: AC | PRN

## 2018-10-15 MED ORDER — AMOXICILLIN 500 MG PO CAPS: 500.0000 mg | ORAL_CAPSULE | Freq: Three times a day (TID) | ORAL | 0 refills | Status: DC

## 2018-10-15 MED FILL — AMOXICILLIN 500 MG CAPSULE: 500 | 10 days supply | Qty: 30 | Fill #0

## 2018-10-15 MED FILL — HYDROCODONE-HOMATROPINE SYR: 5-1.5 | 8 days supply | Qty: 120 | Fill #0

## 2018-10-15 MED FILL — ONDANSETRON HCL 4 MG TABLET: 4 | 6 days supply | Qty: 20 | Fill #0

## 2018-10-15 NOTE — Assessment & Plan Note (Signed)
Symptoms consistent with allergic rhinitis and possibly mild sinus infection, which is likely viral in nature She does follow with an allergist Continue oral antihistamine and start prescription nasal spray Continue saline nasal spray-may need to increase given mild nosebleeds Can use Vaseline in the nostrils at night Having cough-we will prescribe Hycodan Discussed that this is likely a combination of allergies and a viral infection, but I did give her a prescription for amoxicillin to use only if needed.  I do not think she will use this unless it is necessary Call with any concerns or if no improvement

## 2018-10-15 NOTE — Patient Instructions (Addendum)
  Medications reviewed and updated.  Changes include :   Flonase, hycodan cough syrup, change zyrtec to allegra, use the saline nasal spray often and put vaseline in your nostrils at night   Use the amoxicillin if needed only.   Your prescription(s) have been submitted to your pharmacy. Please take as directed and contact our office if you believe you are having problem(s) with the medication(s).

## 2018-10-15 NOTE — Progress Notes (Signed)
Subjective:    Patient ID: [name], female    DOB: [birth year], 73 y.o.   MRN: [id number]  HPI She is here for an acute visit for cold symptoms.  Her symptoms started last week and started getting worse 4 days ago.  She is experiencing occasional chills, nasal congestion, pain deep inside both ears, sinus pain, sore throat, dry cough, mild shortness of breath, couple of occasions, nausea, headaches, lightheadedness/dizziness.  She denies any fever, wheezing or chest pain.  She denies diarrhea and body aches.  She has taken zyrtec, tylenol, saline spray  Medications and allergies reviewed with patient and updated if appropriate.  Patient Active Problem List   Diagnosis Date Noted  . Epistaxis 09/18/2018  . 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 Atypical chest pain 05/21/2018  . Epigastric pain 05/21/2018  . Baker's cyst of knee, right 05/07/2018  . Perennial and seasonal allergic rhinitis 04/18/2018  . History of food allergy 04/18/2018  . Blister of great toe of right foot 03/04/2018  . Onychomycosis of toenail 03/04/2018  . Hypercalcemia 09/17/2017  . Left wrist pain 09/17/2017  . Synovitis of left knee 05/22/2017  . Diabetes ([place]) 05/02/2017  . Trigger point of left shoulder region 03/27/2017  . Taste disorder 02/22/2017  . Anxiety 02/12/2017  . Caregiver with fatigue 01/17/2017  . Psychosocial stressors 01/17/2017  . Fatigue 01/15/2017  . Decreased sense of taste 01/15/2017  . At high risk for falls 11/16/2016  . Family history of osteoporosis 11/16/2016  . Atherosclerotic plaque 11/07/2016  . 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 Renal insufficiency 06/29/2016  . Osteoporosis 06/21/2016  . Dizziness 05/16/2016  . Arthritis of left shoulder region 01/17/2016  . Ankylosing spondylitis ([place]) 11/08/2015  . Incontinence 10/28/2015  . Thoracic back pain 10/19/2015  . Leg cramping  09/29/2015  . 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 Impingement syndrome of left shoulder region 09/22/2014  . Interstitial cystitis 07/09/2014  . Diverticulosis of colon without hemorrhage 07/08/2014  . Carpal tunnel syndrome of left wrist 04/12/2014  . Food allergy 03/06/2014  . Renal calculi 01/30/2014  . OSA (obstructive sleep apnea), moderate 01/14/2014  . Allergic conjunctivitis 12/06/2013  . History of Bell's palsy 02/26/2012  . Vitamin D deficiency 06/27/2010  . 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  . DRY MOUTH 06/27/2010  . ABDOMINAL PAIN, SUPRAPUBIC 05/25/2009  . Gastroesophageal reflux disease 04/30/2009  . History of colonic polyps 04/30/2009  . COSTOCHONDRITIS 01/15/2008  . HYPERLIPIDEMIA 08/21/2007  . Essential hypertension 08/21/2007  . ARTHRALGIA 08/21/2007  . Chronic lumbar radiculopathy 01/25/2007    Current Outpatient Medications on File Prior to Visit  Medication Sig Dispense Refill  . acetaminophen (TYLENOL) 500 MG tablet Take 500 mg by mouth every 4 (four) hours as needed for mild pain.     . Ascorbic Acid (VITAMIN C) 1000 MG tablet Take 1,000 mg by mouth daily.     Marland Kitchen aspirin 81 MG tablet Take 81 mg by mouth daily.      Marland Kitchen azelastine (ASTELIN) 0.1 % nasal spray One to two sprays each nostril twice a day as needed. 30 mL 5  . blood glucose meter kit and supplies KIT Dispense based on patient and insurance preference. Use up to four times daily as directed. prediabetes 1 each 0  . cetirizine (ZYRTEC) 10 MG tablet Take 10 mg by mouth daily.    . Cholecalciferol (VITAMIN D-3) 25 MCG (1000 UT)  CAPS Take 5,000 Units by mouth daily.    Marland Kitchen CREON 36000 units CPEP capsule TAKE 2 CAPSULES ( 72,000 UNITS TOTAL ) THREE TIMES A DAY WITH MEALS AND 1 CAPSULE WITH SNACKS ( MAX 8 CAPSULES DAILY ) DISCONTINUE ZENPEP 720 capsule 0  . Diclofenac Sodium 2 % SOLN Place onto the skin as directed for pain as needed.    . diphenoxylate-atropine (LOMOTIL) 2.5-0.025 MG tablet Take 1  tablet by mouth 4 (four) times daily as needed for diarrhea or loose stools. 90 tablet 2  . EPINEPHrine (EPIPEN 2-PAK) 0.3 mg/0.3 mL IJ SOAJ injection Use as directed for severe allergic reactions 2 Device 2  . hydrochlorothiazide (MICROZIDE) 12.5 MG capsule TAKE 1 CAPSULE (12.5 MG TOTAL) BY MOUTH DAILY. 90 capsule 0  . losartan (COZAAR) 50 MG tablet Take 1 tablet (50 mg total) by mouth daily. 90 tablet 1  . meloxicam (MOBIC) 15 MG tablet TAKE 1 TABLET (15 MG TOTAL) BY MOUTH DAILY AS NEEDED FOR PAIN. WITH FOOD 90 tablet 2  . Olopatadine HCl (PAZEO) 0.7 % SOLN Place 1 drop into both eyes daily. 1 Bottle 5  . Omega-3 Fatty Acids (FISH OIL BURP-LESS) 1000 MG CAPS Takes 1000 mg BID 60 capsule 0  . omeprazole (PRILOSEC) 40 MG capsule TAKE 1 CAPSULE (40 MG TOTAL) BY MOUTH DAILY. 30 capsule 5  . Polyethyl Glycol-Propyl Glycol (SYSTANE OP) Apply 1 drop to eye as needed. Apply to both eyes as needed    . traMADol (ULTRAM) 50 MG tablet     . vitamin B-12 (CYANOCOBALAMIN) 1000 MCG tablet Take 1,000 mcg by mouth daily.     . vitamin E 400 UNIT capsule Take 400 Units by mouth daily.    . rosuvastatin (CRESTOR) 5 MG tablet TAKE 1 TABLET (5 MG TOTAL) BY MOUTH DAILY. 90 tablet 3   No current facility-administered medications on file prior to visit.     Past Medical History:  Diagnosis Date  . Allergy    seasonal  . Ankylosing spondylitis ([place])   . Anxiety   . Arthritis   . Bell palsy   . CTS (carpal tunnel syndrome)   . Cystitis   . Diverticulitis   . Diverticulosis of colon   . Dry eye syndrome   . Exocrine pancreatic insufficiency   . Frozen shoulder   . GERD (gastroesophageal reflux disease)   . HLA B27 (HLA B27 positive)   . Hx of colonic polyps   . Hyperlipidemia    NMR 2005  . Hypertension   . Hypothyroidism   . Incontinence   . Internal hemorrhoids   . Interstitial cystitis   . Kidney stone   . Menopause   . Osteoarthritis   . Osteopenia    [facility] done [facility]  .  S/P total hysterectomy and bilateral salpingo-oophorectomy 04/12/2014  . Sinusitis   . Sleep apnea    c-pap  . Subjective visual disturbance of both eyes 12/30/2013  . Tubular adenoma of colon   . Vitamin D deficiency   . Xerostomia    and Xeroophthalmia    Past Surgical History:  Procedure Laterality Date  . ABDOMINAL HYSTERECTOMY    . BLADDER SURGERY     for incontinence  . CARPAL TUNNEL RELEASE Left 12/22/2014   Procedure: LEFT CARPAL TUNNEL RELEASE;  Surgeon: Daryll Brod, MD;  Location: [facility];  Service: Orthopedics;  Laterality: Left;  . COLONOSCOPY W/ POLYPECTOMY     adenomatous poyp 04-2008  .  colonscopy     tics 10-2002  . CYSTOSCOPY WITH RETROGRADE PYELOGRAM, URETEROSCOPY AND STENT PLACEMENT Left 09/01/2013   Procedure: CYSTOSCOPY WITH RETROGRADE PYELOGRAM, AND LEFT STENT PLACEMENT;  Surgeon: Molli Hazard, MD;  Location: [facility];  Service: Urology;  Laterality: Left;  . elevated LFTs     due to Elmiron  . FINGER SURGERY Left 2004   2nd finger  . G 1 P 1    . interstitial cystitis     with clot in catheter  . SHOULDER ARTHROSCOPY Left   . TOTAL ABDOMINAL HYSTERECTOMY W/ BILATERAL SALPINGOOPHORECTOMY     dysfunctional menses    Social History   Socioeconomic History  . Marital status: Married    Spouse name: Not on file  . Number of children: 1  . Years of education: BS  . Highest education level: Not on file  Occupational History  . Occupation: retired  . Occupation: Pharmacist, hospital    Comment: Kindergarten  Social Needs  . Financial resource strain: Not hard at all  . Food insecurity:    Worry: Never true    Inability: Never true  . Transportation needs:    Medical: No    Non-medical: No  Tobacco Use  . Smoking status: Never Smoker  . Smokeless tobacco: Never Used  Substance and Sexual Activity  . Alcohol use: Not Currently    Comment:      . Drug use: No  . Sexual activity: Not Currently  Lifestyle  . Physical activity:    Days  per week: 0 days    Minutes per session: 0 min  . Stress: Rather much  Relationships  . Social connections:    Talks on phone: More than three times a week    Gets together: More than three times a week    Attends religious service: Not on file    Active member of club or organization: Not on file    Attends meetings of clubs or organizations: Not on file    Relationship status: Married  Other Topics Concern  . Not on file  Social History Narrative   Lives   Caffeine use:    Married       Family History  Problem Relation Age of Onset  . Coronary artery disease Mother   . Hypertension Mother   . Asthma Mother   . Dementia Mother   . Stroke Mother        mini cva  . Diabetes Father   . Cirrhosis Father        non alcoholic  . Cancer Sister        colon  . Colon cancer Sister 6  . Allergic rhinitis Sister   . Allergic rhinitis Sister   . Food Allergy Sister   . Cancer Paternal Aunt        2 aunts had renal cancer  . Coronary artery disease Maternal Grandfather   . Cancer Maternal Aunt        breast  . Diabetes Paternal Aunt   . Diabetes Paternal Grandmother   . Esophageal cancer Neg Hx   . Stomach cancer Neg Hx     Review of Systems  Constitutional: Positive for chills (occasionally). Negative for fever.  HENT: Positive for congestion, ear pain (deep in ears), nosebleeds, sinus pain and sore throat.   Respiratory: Positive for cough and shortness of breath (mild, a couple of times). Negative for wheezing.   Cardiovascular: Negative for chest pain.  Gastrointestinal: Positive for nausea.  Negative for diarrhea and vomiting.  Musculoskeletal: Negative for myalgias.  Neurological: Positive for dizziness, light-headedness and headaches.       Objective:   Vitals:   10/15/18 1314  BP: 114/64  Pulse: 78  Resp: 16  Temp: 98.5 F (36.9 C)  SpO2: 98%   Filed Weights   10/15/18 1314  Weight: 179 lb (81.2 kg)   Body mass index is 32.74 kg/m.  Wt Readings  from Last 3 Encounters:  10/15/18 179 lb (81.2 kg)  09/18/18 165 lb 12.6 oz (75.2 kg)  08/01/18 164 lb (74.4 kg)     Physical Exam GENERAL APPEARANCE: Appears stated age, well appearing, NAD EYES: conjunctiva clear, no icterus HEENT: bilateral tympanic membranes and ear canals normal, oropharynx with no erythema, mild sinus tenderness maxillary regions with palpation, no thyromegaly, trachea midline, no cervical or supraclavicular lymphadenopathy LUNGS: Clear to auscultation without wheeze or crackles, unlabored breathing, good air entry bilaterally CARDIOVASCULAR: Normal S1,S2 without murmurs, no edema SKIN: warm, dry        Assessment & Plan:   See Problem List for Assessment and Plan of chronic medical problems.

## 2018-10-16 ENCOUNTER — Encounter: Payer: Self-pay | Admitting: Cardiology

## 2018-10-17 ENCOUNTER — Telehealth (HOSPITAL_COMMUNITY): Payer: Self-pay | Admitting: Cardiology

## 2018-10-17 NOTE — Telephone Encounter (Signed)
Attempted to contact pt for COVID19 Screening, pt has an appt with Dr. Meda Coffee for 10/25/18 at [facility].  Left the pt a message on both contact numbers provided, to call the office back and ask to speak with myself or a triage nurse.

## 2018-10-17 NOTE — Telephone Encounter (Signed)
The patient can be postponed for 12 weeks ( 3 = low risk) unless she has new or worsening symptoms.   Ena Dawley, MD 10/17/2018

## 2018-10-18 ENCOUNTER — Other Ambulatory Visit: Payer: Self-pay | Admitting: Cardiology

## 2018-10-18 ENCOUNTER — Encounter: Payer: Self-pay | Admitting: Internal Medicine

## 2018-10-18 MED FILL — LOSARTAN POTASSIUM 50 MG TA: 50 | 90 days supply | Qty: 90 | Fill #1

## 2018-10-18 NOTE — Telephone Encounter (Signed)
Called pt to postpone 3/27 appt Pt agreeable and reports no new symptoms Advised pt to call if any new symptoms present Will route to COVID cancel pool for followup

## 2018-10-25 ENCOUNTER — Ambulatory Visit: Payer: Medicare Other | Admitting: Cardiology

## 2018-10-25 MED FILL — ROSUVASTATIN CALCIUM 5 MG T: 5 | 90 days supply | Qty: 90 | Fill #1

## 2018-11-03 NOTE — Progress Notes (Signed)
Virtual Visit via Video Note  I connected with [name] on 11/03/18 at  8:30 AM EDT by a video enabled telemedicine application and verified that I am speaking with the correct person using two identifiers.   I discussed the limitations of evaluation and management by telemedicine and the availability of in person appointments. The patient expressed understanding and agreed to proceed.  The patient is currently at home and I am in the office.    No referring provider.    History of Present Illness: She is here for follow up of her chronic medical conditions.   She is exercising some - walking.    Diabetes: She is controlling her sugars with lifestyle. She is fairly compliant with a diabetic diet.  She is up-to-date with an ophthalmology examination.   Hypertension: She is taking her medication daily. She is compliant with a low sodium diet.  She denies chest pain, shortness of breath and regular headaches. She does monitor her blood pressure at home and it has been controlled.    Hyperlipidemia: She is taking her medication daily. She is compliant with a low fat/cholesterol diet. She denies new myalgias.   GERD:  She is taking her medication daily as prescribed.  She has occasional GERD symptoms typically related to something she has eaten.  She feels her GERD is well controlled.  Dry cough: She continues to have a dry cough.  She wonders if it is allergy related.  She continues to have drainage.  She has occasional GERD.  She is taking her allergy medication and we had discussed at her last visit to consider switching to a different medication, which she has not done yet and wonders if she should still do.  Nausea: She has been experiencing nausea for a little while.  She states that usually starts in the evening and lasts until that time.  She does not think it is related to eating or food.  She denies any abdominal pain.  Edema: She has had some mild edema in her hands, ankles  and legs.  She states it is minimal.  It does not go away overnight.  She can see the indentation from her sock in her ankle.  She admits she probably is eating more cold than usual since they have been eating more at home and not eating like they typically do.  Hair loss: She has noticed increased hair loss for no reason.  She wondered if this was related to medication or something else.  She has occasional palpitations, which is not new.  She has mild edema.  She has chronic intermittent lightheadedness that she thinks may be from 1 of her blood pressure medications.  She denies wheezing, shortness of breath, chest pain and regular headaches.    Observations/Objective: Appears well in NAD.  Home vitals: Temperature this morning 98.5 Blood pressure 112/78 Pulse 75 Weight 163.1 pounds  Assessment and Plan:  See Problem List for Assessment and Plan of chronic medical problems.  Follow Up Instructions:    I discussed the assessment and treatment plan with the patient. The patient was provided an opportunity to ask questions and all were answered. The patient agreed with the plan and demonstrated an understanding of the instructions.   The patient was advised to call back or seek an in-person evaluation if the symptoms worsen or if the condition fails to improve as anticipated.  Follow up in 6 months.  Ideally blood work over the summer.   Claudina Lick  Quay Burow, MD

## 2018-11-04 ENCOUNTER — Ambulatory Visit (INDEPENDENT_AMBULATORY_CARE_PROVIDER_SITE_OTHER): Payer: Medicare Other | Admitting: Internal Medicine

## 2018-11-04 ENCOUNTER — Encounter: Payer: Self-pay | Admitting: Internal Medicine

## 2018-11-04 DIAGNOSIS — E119 Type 2 diabetes mellitus without complications: Secondary | ICD-10-CM

## 2018-11-04 DIAGNOSIS — K219 Gastro-esophageal reflux disease without esophagitis: Secondary | ICD-10-CM

## 2018-11-04 DIAGNOSIS — E782 Mixed hyperlipidemia: Secondary | ICD-10-CM | POA: Diagnosis not present

## 2018-11-04 DIAGNOSIS — L659 Nonscarring hair loss, unspecified: Secondary | ICD-10-CM

## 2018-11-04 DIAGNOSIS — J3089 Other allergic rhinitis: Secondary | ICD-10-CM

## 2018-11-04 DIAGNOSIS — I10 Essential (primary) hypertension: Secondary | ICD-10-CM

## 2018-11-04 DIAGNOSIS — R609 Edema, unspecified: Secondary | ICD-10-CM

## 2018-11-04 NOTE — Assessment & Plan Note (Signed)
Continue Crestor 

## 2018-11-04 NOTE — Assessment & Plan Note (Signed)
Diet controlled Last A1c 6.6% Encouraged her to continue walking outside Stressed low sugar/carbohydrate diet and to keep weight at a good level to avoid medication We will follow-up with me in 6 months, but discussed that we could get blood work done over the summer once the coronavirus situation has resolved

## 2018-11-04 NOTE — Assessment & Plan Note (Signed)
Her dry cough could be related to allergies-no evidence of infection Has been taking Zyrtec.  We discussed at her last visit and again now switching to [facility], which she will try Does follow with Dr. Verlin Fester Has nasal spray, saline spray

## 2018-11-04 NOTE — Assessment & Plan Note (Signed)
Overall controlled, but does have occasional GERD with certain foods Has been experiencing nausea in the evening to bedtime Discussed this could be atypical GERD We will try switching omeprazole from morning to before dinner If no improvement will discuss with GI

## 2018-11-04 NOTE — Assessment & Plan Note (Signed)
Has had increased hair loss Discussed that this could be related to medication, thyroid abnormalities, stress, hormonal/age or less likely vitamin deficiency Can do blood work once the coronavirus situation is resolved If hair loss continues can refer to dermatology

## 2018-11-04 NOTE — Assessment & Plan Note (Signed)
BP well controlled at home Current regimen effective and well tolerated Continue current medications at current doses

## 2018-11-04 NOTE — Assessment & Plan Note (Signed)
Has mild swelling in hands and lower extremities She states it does not go away overnight, can see indentation from her socks She has been consuming more salt and most likely this is the cause She is on hydrochlorothiazide 12.5 mg daily Discussed that since it is mild to treat conservatively-elevate legs when sitting, Continue with regular exercise, Decrease salt intake, Keep weight down If edema does not resolve or worsens we can increase hctz, but would need to dec losartan since BP is very good.

## 2018-11-05 DIAGNOSIS — M5416 Radiculopathy, lumbar region: Secondary | ICD-10-CM | POA: Diagnosis not present

## 2018-11-08 ENCOUNTER — Encounter: Payer: Self-pay | Admitting: Internal Medicine

## 2018-11-08 DIAGNOSIS — K219 Gastro-esophageal reflux disease without esophagitis: Secondary | ICD-10-CM

## 2018-11-08 IMAGING — DX DG ANKLE COMPLETE 3+V*L*
3 series · 3 of 3 positions shown · non-contrast
Comparison: 11/29/2010 .

CLINICAL DATA: Fall.  Swelling.

EXAM:
LEFT ANKLE COMPLETE - 3+ VIEW

[ankle ap]
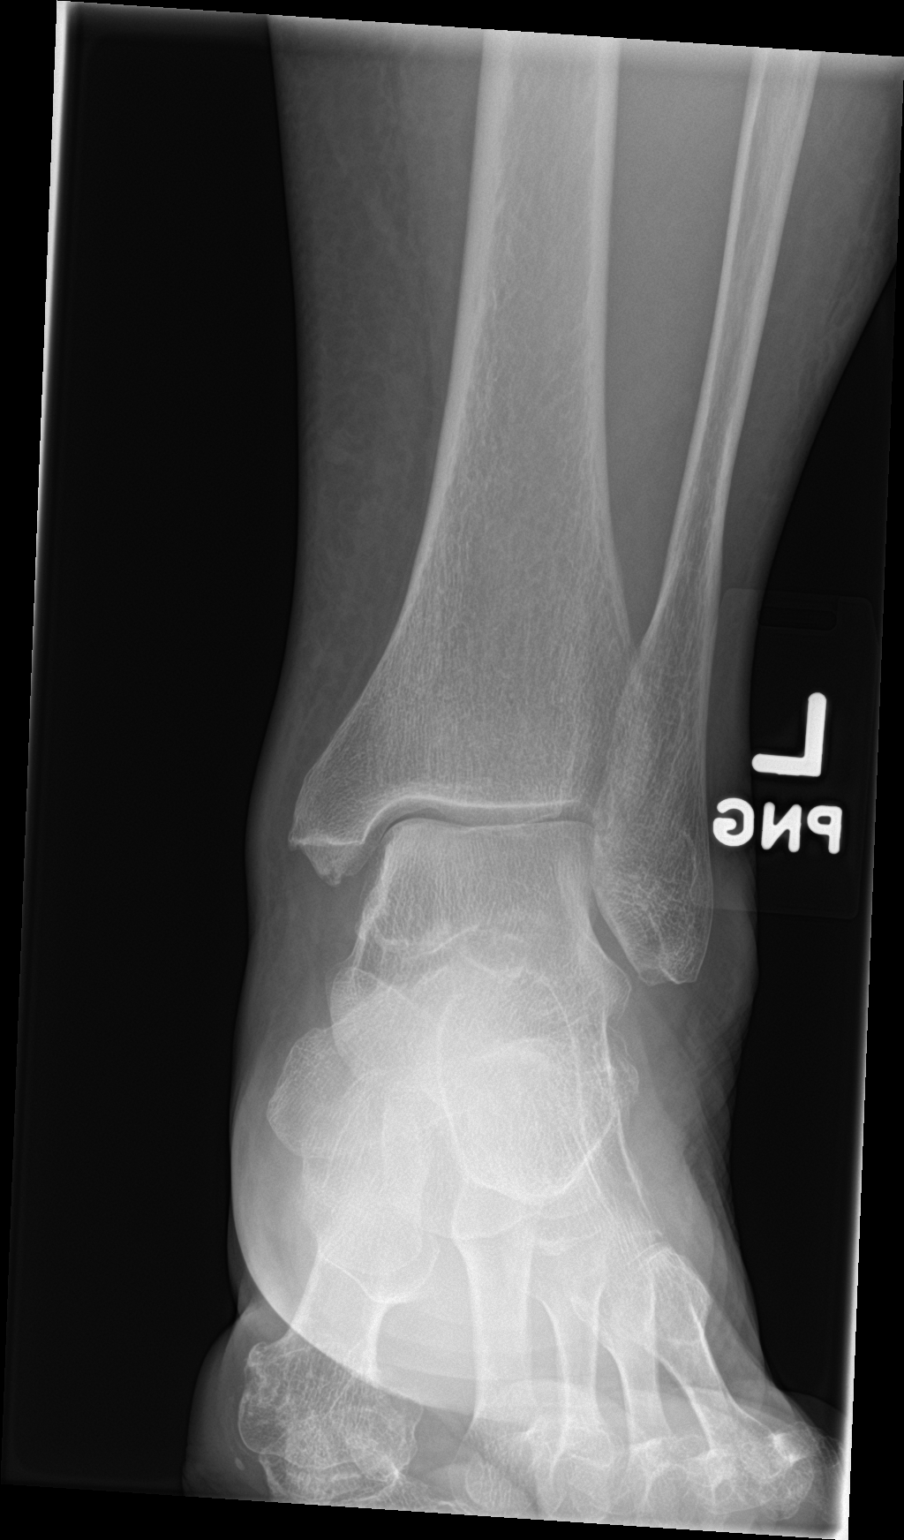

[ankle obl]
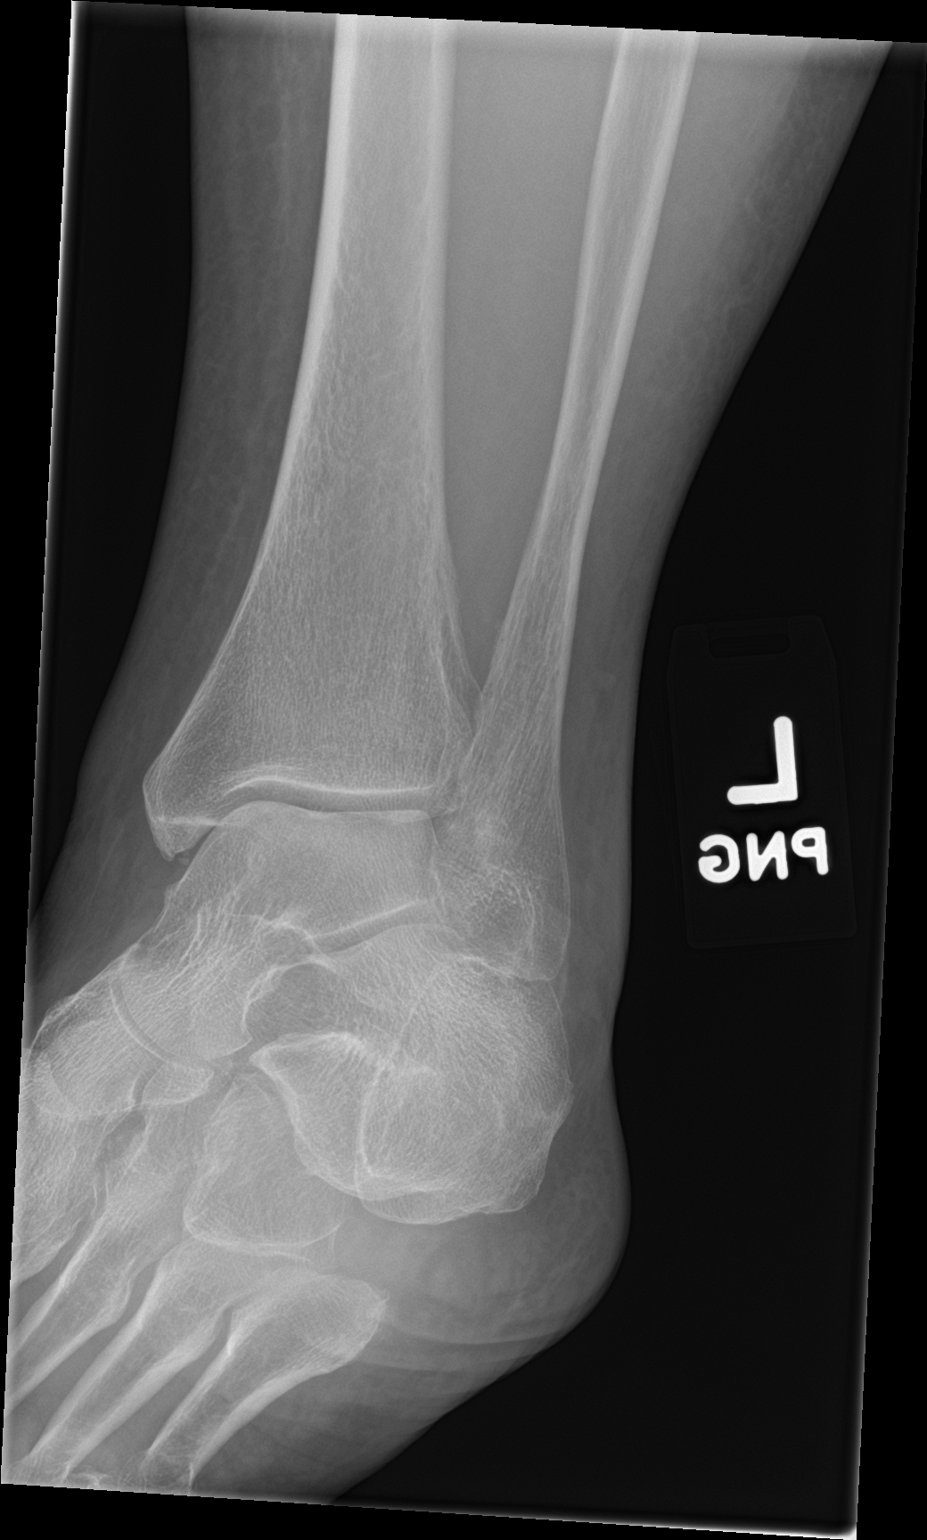

[ankle lat]
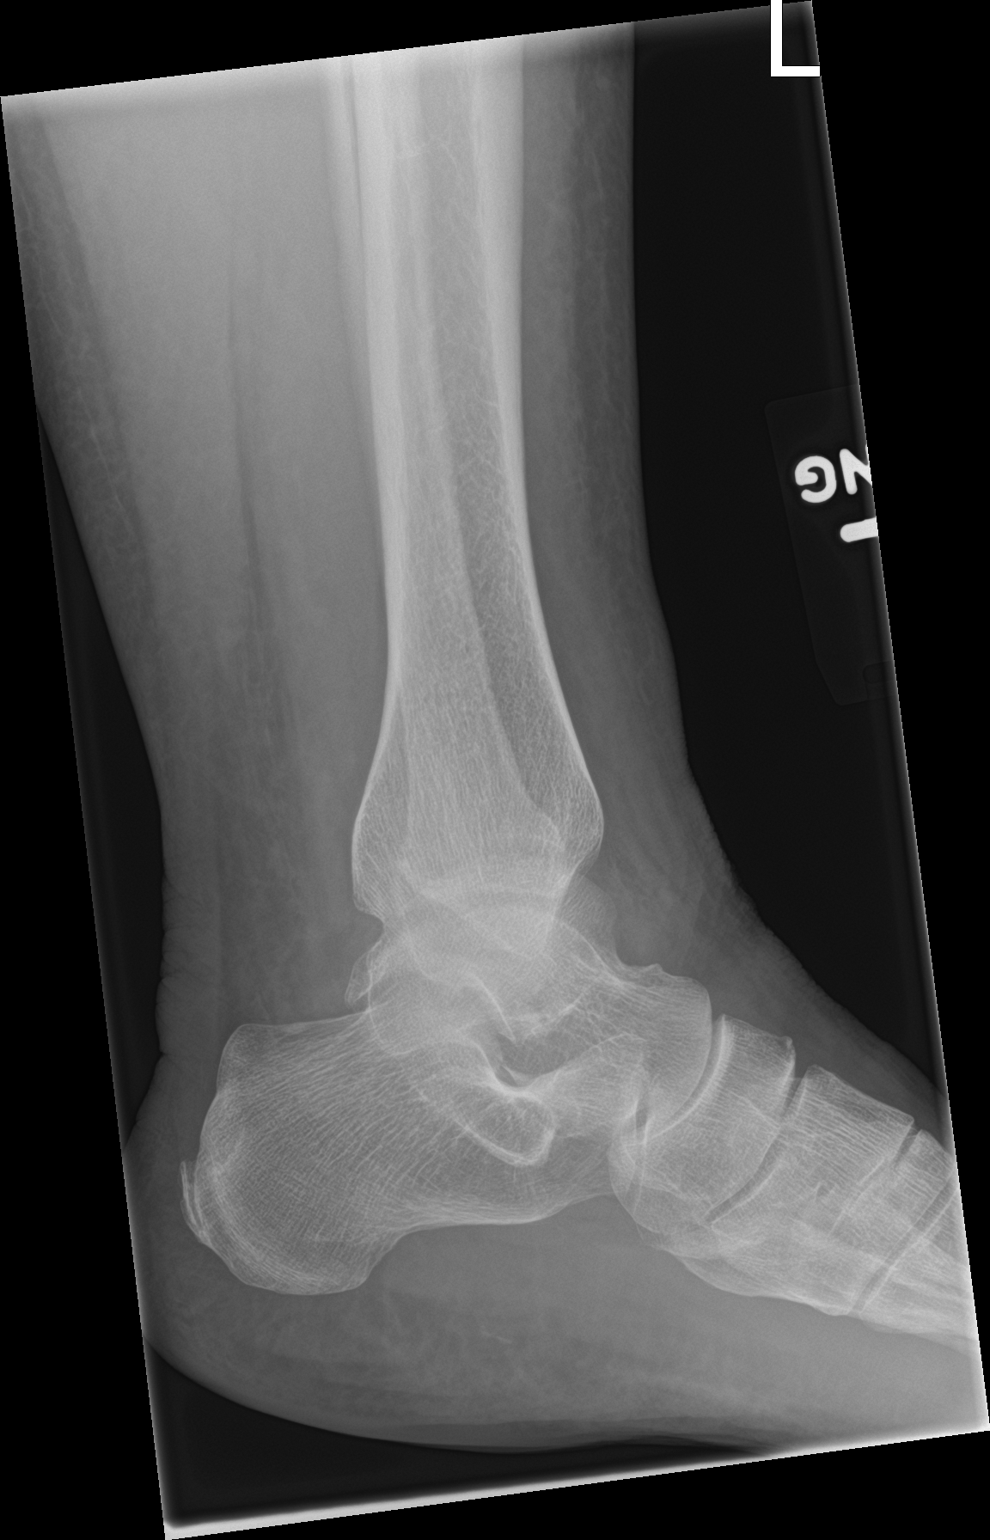

[3 of 3 positions shown; findings below may reference images not displayed]

FINDINGS: Diffuse soft tissue swelling. Tiny bony densities noted adjacent to
the medial malleolus. These could represent tiny avulsion fracture
fragments. Age undetermined. No other focal abnormality identified .
IMPRESSION: Tiny avulsion fracture fragments noted along the distal aspect of
the medial malleolus. Diffuse soft tissue swelling .

## 2018-11-12 MED ORDER — OMEPRAZOLE 40 MG PO CPDR: 40.0000 mg | DELAYED_RELEASE_CAPSULE | Freq: Every day | ORAL | 0 refills | Status: DC

## 2018-11-12 NOTE — Telephone Encounter (Signed)
Contacted patient to schedule telehealth visit with Dr. Francesca Oman PA. Patient states that she is willing to do VIDEO Visit but only wants to have it with Dr. Meda Coffee. Will send to Dr. Francesca Oman nurse to work patient in for [employer].

## 2018-11-13 ENCOUNTER — Encounter: Payer: Self-pay | Admitting: *Deleted

## 2018-11-13 NOTE — Telephone Encounter (Signed)
Virtual Visit Pre-Appointment Phone Call  Steps For Call:  1. Confirm consent - "In the setting of the current Covid19 crisis, you are scheduled for a ( video) visit with Dr. Meda Coffee on 11/25/18 at 0800.  Just as we do with many in-office visits, in order for you to participate in this visit, we must obtain consent.  If you'd like, I can send this to your mychart (if signed up) or email for you to review.  Otherwise, I can obtain your verbal consent now.  All virtual visits are billed to your insurance company just like a normal visit would be.  By agreeing to a virtual visit, we'd like you to understand that the technology does not allow for your provider to perform an examination, and thus may limit your provider's ability to fully assess your condition.  Finally, though the technology is pretty good, we cannot assure that it will always work on either your or our end, and in the setting of a video visit, we may have to convert it to a phone-only visit.  In either situation, we cannot ensure that we have a secure connection.  Are you willing to proceed?" STAFF: Did the patient verbally acknowledge consent to telehealth visit? Document YES/NO here: PT GAVE VERBAL CONSENT TO TREAT AS WELL AS SHE IS AWARE THAT I WILL SEND HER CONSENT TO TREAT TO HER ACTIVE MYCHART, AND SHE IS TO VIEW THIS PRIOR TO HER APPOINTMENT.  2. Confirm the BEST phone number to call the day of the visit by including in appointment notes-[phone]  3. Give patient instructions FOR DOXIMITY THROUGH HER smartphone.    4. Advise patient to be prepared with their blood pressure, heart rate, weight, any heart rhythm information, their current medicines, and a piece of paper and pen handy for any instructions they may receive the day of their visit  5. Inform patient they will receive a phone call 15 minutes prior to their appointment time (may be from unknown caller ID) so they should be prepared to answer  6. Confirm that  appointment type is correct in Epic appointment notes (VIDEO visit with Dr Meda Coffee on 4/27 at 8 am)     TELEPHONE CALL NOTE  [name] has been deemed a candidate for a follow-up tele-health visit to limit community exposure during the Covid-19 pandemic. I spoke with the patient via phone to ensure availability of phone/video source, confirm preferred email & phone number, and discuss instructions and expectations.  I reminded [name] to be prepared with any vital sign and/or heart rhythm information that could potentially be obtained via home monitoring, at the time of her visit. I reminded [name] SENTENO to expect a phone call at the time of her visit if her visit.  [name] Alpha, LPN 02/16/9469 96:28 PM    IF USING DOXIMITY  - The patient will receive a link just prior to their visit, either by text or email (to be determined day of appointment depending on if it's [email or web address] or Doximity). YES THIS WAS ENDORSED TO THE PT.     FULL LENGTH CONSENT FOR TELE-HEALTH VISIT   I hereby voluntarily request, consent and authorize [facility] and its employed or contracted physicians, physician assistants, nurse practitioners or other licensed health care professionals (the Practitioner), to provide me with telemedicine health care services (the "Services") as deemed necessary by the treating Practitioner. I acknowledge and consent to receive the Services by the Practitioner via telemedicine. I understand that  the telemedicine visit will involve communicating with the Practitioner through live audiovisual communication technology and the disclosure of certain medical information by electronic transmission. I acknowledge that I have been given the opportunity to request an in-person assessment or other available alternative prior to the telemedicine visit and am voluntarily participating in the telemedicine visit.  I understand that I have the right to withhold or withdraw my consent to the  use of telemedicine in the course of my care at any time, without affecting my right to future care or treatment, and that the Practitioner or I may terminate the telemedicine visit at any time. I understand that I have the right to inspect all information obtained and/or recorded in the course of the telemedicine visit and may receive copies of available information for a reasonable fee.  I understand that some of the potential risks of receiving the Services via telemedicine include:  Marland Kitchen Delay or interruption in medical evaluation due to technological equipment failure or disruption; . Information transmitted may not be sufficient (e.g. poor resolution of images) to allow for appropriate medical decision making by the Practitioner; and/or  . In rare instances, security protocols could fail, causing a breach of personal health information.  Furthermore, I acknowledge that it is my responsibility to provide information about my medical history, conditions and care that is complete and accurate to the best of my ability. I acknowledge that Practitioner's advice, recommendations, and/or decision may be based on factors not within their control, such as incomplete or inaccurate data provided by me or distortions of diagnostic images or specimens that may result from electronic transmissions. I understand that the practice of medicine is not an exact science and that Practitioner makes no warranties or guarantees regarding treatment outcomes. I acknowledge that I will receive a copy of this consent concurrently upon execution via email to the email address I last provided but may also request a printed copy by calling the office of [facility].    I understand that my insurance will be billed for this visit.   I have read or had this consent read to me. . I understand the contents of this consent, which adequately explains the benefits and risks of the Services being provided via telemedicine.  . I have  been provided ample opportunity to ask questions regarding this consent and the Services and have had my questions answered to my satisfaction. . I give my informed consent for the services to be provided through the use of telemedicine in my medical care  By participating in this telemedicine visit I agree to the above.  YES PATIENT GAVE CONSENT AS WELL AS SHE WILL READ THE CONSENT INFORMATION THAT WILL BE SENT TO HER MYCHART TO REVIEW PRIOR TO HER APPT.

## 2018-11-24 NOTE — Progress Notes (Signed)
Virtual Visit via Video Note   This visit type was conducted due to national recommendations for restrictions regarding the COVID-19 Pandemic (e.g. social distancing) in an effort to limit this patient's exposure and mitigate transmission in our community.  Due to her co-morbid illnesses, this patient is at least at moderate risk for complications without adequate follow up.  This format is felt to be most appropriate for this patient at this time.  All issues noted in this document were discussed and addressed.  A limited physical exam was performed with this format.  Please refer to the patient's chart for her consent to telehealth for [facility].   Evaluation Performed:  Follow-up visit  Date:  11/25/2018   ID:  Pieper, Kasik Nov 01, 1945, MRN [id number]  Patient Location: Home Provider Location: Home  PCP:  Binnie Rail, MD  Cardiologist:  Ena Dawley, MD  Electrophysiologist:  None   Chief Complaint:  LE cramping, rash, LE edema  History of Present Illness:    [name] is a 73 y.o. female with prior history of hypertension, hyperlipidemia who was previously seen in our office in 2014 for concerns of chest pain and underwent an exercise nuclear stress test that showed LVEF of 70% and no prior infarct or ischemia. She came 3 years later for occasional left-sided chest pain not related to exertion. She has been treated with Toprol for high blood pressure. She complains of fatigue and occasional dizziness. She also has occasional lower extremity edema. Denies any orthopnea or proximal nocturnal dyspnea. 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 She has never had an echocardiogram to evaluate for aortic regurgitation or ascending aortic aneurysm. She denies any palpitations or syncope. She has visited ER on 10/12/16 with dizziness and chest pain and was discharged home after ACS was ruled out.  07/03/2018 - 6 months  follow-up, the patient has been experiencing back pain and was diagnosed with spinal stenosis for which she received steroid injection complicated by inability to walk for couple hours.  This is now resolved.  She feels slightly more dizzy, her blood pressure is elevated today, she has minimal lower extremity edema.  She went to the ER in October with chest pain, ACS was ruled out and after Prilosec was restarted her symptoms have resolved.  11/24/2018 - 6 months follow up - the patient denies any chest pain or SOB> She has developed swelling of upper and lower extremities as well as eyelids and lips. No problems swallowing. No fever, chills. Refers to night leg cramping and also at rest, no caludications while walking.  The patient does not have symptoms concerning for COVID-19 infection (fever, chills, cough, or new shortness of breath).   Past Medical History:  Diagnosis Date  . Allergy    seasonal  . Ankylosing spondylitis ([place])   . Anxiety   . Arthritis   . Bell palsy   . CTS (carpal tunnel syndrome)   . Cystitis   . Diverticulitis   . Diverticulosis of colon   . Dry eye syndrome   . Exocrine pancreatic insufficiency   . Frozen shoulder   . GERD (gastroesophageal reflux disease)   . HLA B27 (HLA B27 positive)   . Hx of colonic polyps   . Hyperlipidemia    NMR 2005  . Hypertension   . Hypothyroidism   . Incontinence   . Internal hemorrhoids   . Interstitial cystitis   . Kidney stone   .  Menopause   . Osteoarthritis   . Osteopenia    [facility]  . S/P total hysterectomy and bilateral salpingo-oophorectomy 04/12/2014  . Sinusitis   . Sleep apnea    c-pap  . Subjective visual disturbance of both eyes 12/30/2013  . Tubular adenoma of colon   . Vitamin D deficiency   . Xerostomia    and Xeroophthalmia   Past Surgical History:  Procedure Laterality Date  . ABDOMINAL HYSTERECTOMY    . BLADDER SURGERY     for incontinence  . CARPAL TUNNEL RELEASE  Left 12/22/2014   Procedure: LEFT CARPAL TUNNEL RELEASE;  Surgeon: Daryll Brod, MD;  Location: [place];  Service: Orthopedics;  Laterality: Left;  . COLONOSCOPY W/ POLYPECTOMY     adenomatous poyp 04-2008  . colonscopy     tics 10-2002  . CYSTOSCOPY WITH RETROGRADE PYELOGRAM, URETEROSCOPY AND STENT PLACEMENT Left 09/01/2013   Procedure: CYSTOSCOPY WITH RETROGRADE PYELOGRAM, AND LEFT STENT PLACEMENT;  Surgeon: Molli Hazard, MD;  Location: [facility];  Service: Urology;  Laterality: Left;  . elevated LFTs     due to [place]  . FINGER SURGERY Left 2004   2nd finger  . G 1 P 1    . interstitial cystitis     with clot in catheter  . SHOULDER ARTHROSCOPY Left   . TOTAL ABDOMINAL HYSTERECTOMY W/ BILATERAL SALPINGOOPHORECTOMY     dysfunctional menses     Current Meds  Medication Sig  . acetaminophen (TYLENOL) 500 MG tablet Take 1,000 mg by mouth 3 (three) times daily as needed for mild pain.   . Ascorbic Acid (VITAMIN C) 1000 MG tablet Take 1,000 mg by mouth daily.   Marland Kitchen aspirin 81 MG tablet Take 81 mg by mouth daily.    Marland Kitchen azelastine (ASTELIN) 0.1 % nasal spray One to two sprays each nostril twice a day as needed.  . blood glucose meter kit and supplies KIT Dispense based on patient and insurance preference. Use up to four times daily as directed. prediabetes  . cetirizine (ZYRTEC) 10 MG tablet Take 10 mg by mouth daily.  . Cholecalciferol (VITAMIN D-3) 25 MCG (1000 UT) CAPS Take 5,000 Units by mouth daily.  Marland Kitchen CREON 36000 units CPEP capsule TAKE 2 CAPSULES ( 72,000 UNITS TOTAL ) THREE TIMES A DAY WITH MEALS AND 1 CAPSULE WITH SNACKS ( MAX 8 CAPSULES DAILY ) DISCONTINUE ZENPEP  . Diclofenac Sodium 2 % SOLN Place onto the skin as directed for pain as needed.  . diphenoxylate-atropine (LOMOTIL) 2.5-0.025 MG tablet Take 1 tablet by mouth 4 (four) times daily as needed for diarrhea or loose stools.  Marland Kitchen EPINEPHrine (EPIPEN 2-PAK) 0.3 mg/0.3 mL IJ SOAJ injection Use as directed for  severe allergic reactions  . hydrochlorothiazide (MICROZIDE) 12.5 MG capsule TAKE 1 CAPSULE (12.5 MG TOTAL) BY MOUTH DAILY.  Marland Kitchen losartan (COZAAR) 50 MG tablet Take 1 tablet (50 mg total) by mouth daily.  . meloxicam (MOBIC) 15 MG tablet TAKE 1 TABLET (15 MG TOTAL) BY MOUTH DAILY AS NEEDED FOR PAIN. WITH FOOD  . Olopatadine HCl 0.7 % SOLN Place 1 drop into both eyes as needed.  . Omega-3 Fatty Acids (FISH OIL BURP-LESS) 1000 MG CAPS Takes 1000 mg BID  . omeprazole (PRILOSEC) 40 MG capsule Take 1 capsule (40 mg total) by mouth daily.  Vladimir Faster Glycol-Propyl Glycol (SYSTANE OP) Apply 1 drop to eye as needed. Apply to both eyes as needed  . traMADol (ULTRAM) 50  MG tablet Take 50 mg by mouth as needed.   . vitamin B-12 (CYANOCOBALAMIN) 1000 MCG tablet Take 1,000 mcg by mouth daily.   . vitamin E 400 UNIT capsule Take 400 Units by mouth daily.     Allergies:   Bactrim [sulfamethoxazole-trimethoprim]; Nitroglycerin; Pentosan polysulfate sodium; Promethazine hcl; Sulfamethoxazole-trimethoprim; and Lisinopril   Social History   Tobacco Use  . Smoking status: Never Smoker  . Smokeless tobacco: Never Used  Substance Use Topics  . Alcohol use: Not Currently    Comment:      . Drug use: No     Family Hx: The patient's family history includes Allergic rhinitis in her sister and sister; Asthma in her mother; Cancer in her maternal aunt, paternal aunt, and sister; Cirrhosis in her father; Colon cancer (age of onset: 43) in her sister; Coronary artery disease in her maternal grandfather and mother; Dementia in her mother; Diabetes in her father, paternal aunt, and paternal grandmother; Food Allergy in her sister; Hypertension in her mother; Stroke in her mother. There is no history of Esophageal cancer or Stomach cancer.  ROS:   Please see the history of present illness.    All other systems reviewed and are negative.   Prior CV studies:   The following studies were reviewed today:    Labs/Other Tests and Data Reviewed:    EKG:  No ECG reviewed.  Recent Labs: 03/20/2018: TSH 4.32 07/18/2018: ALT 29; Hemoglobin 12.2; Platelets 252 08/20/2018: BUN 18; Creatinine, Ser 1.10; Potassium 4.4; Sodium 142   Recent Lipid Panel Lab Results  Component Value Date/Time   CHOL 150 05/03/2018 09:13 AM   CHOL 125 08/06/2017 09:24 AM   TRIG 95.0 05/03/2018 09:13 AM   TRIG 181 (H) 07/23/2006 11:01 AM   HDL 65.90 05/03/2018 09:13 AM   HDL 56 08/06/2017 09:24 AM   CHOLHDL 2 05/03/2018 09:13 AM   LDLCALC 65 05/03/2018 09:13 AM   LDLCALC 43 08/06/2017 09:24 AM   LDLDIRECT 114.0 05/02/2016 09:19 AM    Wt Readings from Last 3 Encounters:  11/25/18 163 lb (73.9 kg)  10/15/18 179 lb (81.2 kg)  09/18/18 165 lb 12.6 oz (75.2 kg)     Objective:    Vital Signs:  BP 112/80   Pulse 76   Ht 5' 2.5" (1.588 m)   Wt 163 lb (73.9 kg)   BMI 29.34 kg/m    VITAL SIGNS:  reviewed  ASSESSMENT & PLAN:    1. Hypertension - controlled, but concern for losartan side effect, we will d/c losartan and HCTZ and start spironolactone 25 mg po daily. 2. Hyperlipidemia -LDL and HDL at goal with rosuvastatin. 3. Chest pain - negative stress test in the past, calcium score and coronary CTA showed coronary calcium score of 56. This was 2 percentile for age and sex matched control. Moderate CAD in RCA/PDA, CT FFR was normal.  Recent chest pain resolved with Prilosec. 4. HLA B gene positivity - associated ankylosing spondylitis, echo in 09/2016 showed no ascending aortic aneurysm but mild aortic regurgitation. We will follow every other year. 5. LE edema - start spironolactone 25 mg po daily 6. Leg cramping - resume low dose magnesium 200 mg po daily, higher doses caused diarrhea, she is reluctant to start gabapentin.  Follow-up with blood pressure clinic in 3 weeks, follow-up with [place] in 4 months.  COVID-19 Education: The signs and symptoms of COVID-19 were discussed with the patient and how to seek  care for testing (follow up with PCP  or arrange E-visit).  The importance of social distancing was discussed today.  Time:   Today, I have spent 30 minutes with the patient with telehealth technology discussing the above problems.     Medication Adjustments/Labs and Tests Ordered: Current medicines are reviewed at length with the patient today.  Concerns regarding medicines are outlined above.   Tests Ordered: No orders of the defined types were placed in this encounter.   Medication Changes: No orders of the defined types were placed in this encounter.   Disposition:  Follow up in 4 week(s)  Signed, Ena Dawley, MD  11/25/2018 8:24 AM    [place]

## 2018-11-25 ENCOUNTER — Encounter: Payer: Self-pay | Admitting: *Deleted

## 2018-11-25 ENCOUNTER — Encounter: Payer: Self-pay | Admitting: Cardiology

## 2018-11-25 ENCOUNTER — Telehealth (INDEPENDENT_AMBULATORY_CARE_PROVIDER_SITE_OTHER): Payer: Medicare Other | Admitting: Cardiology

## 2018-11-25 ENCOUNTER — Other Ambulatory Visit: Payer: Self-pay

## 2018-11-25 DIAGNOSIS — I10 Essential (primary) hypertension: Secondary | ICD-10-CM

## 2018-11-25 DIAGNOSIS — H1013 Acute atopic conjunctivitis, bilateral: Secondary | ICD-10-CM | POA: Diagnosis not present

## 2018-11-25 DIAGNOSIS — R600 Localized edema: Secondary | ICD-10-CM

## 2018-11-25 DIAGNOSIS — I2510 Atherosclerotic heart disease of native coronary artery without angina pectoris: Secondary | ICD-10-CM

## 2018-11-25 DIAGNOSIS — M459 Ankylosing spondylitis of unspecified sites in spine: Secondary | ICD-10-CM | POA: Diagnosis not present

## 2018-11-25 MED ORDER — MAGNESIUM 200 MG PO TABS: 200.0000 mg | ORAL_TABLET | Freq: Every day | ORAL | 6 refills | Status: DC

## 2018-11-25 MED ORDER — SPIRONOLACTONE 25 MG PO TABS: 25.0000 mg | ORAL_TABLET | Freq: Every day | ORAL | 1 refills | Status: DC

## 2018-11-25 MED FILL — SPIRONOLACTONE 25 MG TABLET: 25 | 90 days supply | Qty: 90 | Fill #0

## 2018-11-25 MED FILL — MAGNESIUM OXIDE 400 MG TAB: 400 (240 MG | 240 days supply | Qty: 120 | Fill #0

## 2018-11-25 NOTE — Patient Instructions (Addendum)
Medication Instructions:   STOP TAKING LOSARTAN NOW  STOP TAKING HYDROCHLOROTHIAZIDE NOW  START TAKING SPIRONOLACTONE 25 MG BY MOUTH DAILY  START TAKING MAGNESIUM 200 MG BY MOUTH DAILY  If you need a refill on your cardiac medications before your next appointment, please call your pharmacy.     Follow-Up:  WITH DR Meda Coffee IN 4 WEEKS AS ANOTHER VIRTUAL VIDEO VISIT ON 12/25/18 at 10:20 am--THIS TIME SLOT WILL CHANGE THAT DAY, BUT WE WILL MAKE YOU AWARE OF THE TIME DR NELSON WILL CALL TO DO YOUR VIDEO VISIT.

## 2018-11-27 ENCOUNTER — Encounter: Payer: Self-pay | Admitting: *Deleted

## 2018-11-28 ENCOUNTER — Other Ambulatory Visit: Payer: Self-pay

## 2018-11-28 ENCOUNTER — Encounter: Payer: Self-pay | Admitting: Internal Medicine

## 2018-11-28 ENCOUNTER — Ambulatory Visit (INDEPENDENT_AMBULATORY_CARE_PROVIDER_SITE_OTHER): Payer: Medicare Other | Admitting: Internal Medicine

## 2018-11-28 VITALS — Ht 61.5 in | Wt 163.0 lb

## 2018-11-28 DIAGNOSIS — Z636 Dependent relative needing care at home: Secondary | ICD-10-CM

## 2018-11-28 DIAGNOSIS — I2510 Atherosclerotic heart disease of native coronary artery without angina pectoris: Secondary | ICD-10-CM

## 2018-11-28 DIAGNOSIS — K219 Gastro-esophageal reflux disease without esophagitis: Secondary | ICD-10-CM

## 2018-11-28 DIAGNOSIS — K580 Irritable bowel syndrome with diarrhea: Secondary | ICD-10-CM | POA: Diagnosis not present

## 2018-11-28 DIAGNOSIS — L659 Nonscarring hair loss, unspecified: Secondary | ICD-10-CM | POA: Diagnosis not present

## 2018-11-28 DIAGNOSIS — K8681 Exocrine pancreatic insufficiency: Secondary | ICD-10-CM | POA: Diagnosis not present

## 2018-11-28 NOTE — Progress Notes (Addendum)
Subjective:   [name] is a 73 y.o. female who presents for Medicare Annual (Subsequent) preventive examination.  I connected with patient 11/29/18 at 11:00 AM EDT by a video enabled telemedicine application and verified that I am speaking with the correct person using two identifiers. Patient stated full name and DOB. Patient gave permission to continue with virtual visit. Patient's location was at home and Nurse's location was at [place] office.   Review of Systems:  No ROS.  Medicare Wellness Virtual Visit. UTA vital signs.  Additional risk factors are reflected in the social history.  Cardiac Risk Factors include: advanced age (>21mn, >>50women);diabetes mellitus;dyslipidemia;hypertension Sleep patterns: feels rested on waking, gets up 0-1 times nightly to void and sleeps 7-8 hours nightly.    Home Safety/Smoke Alarms: Feels safe in home. Smoke alarms in place.  Living environment; residence and Firearm Safety: 1-story house/ trailer Lives with husband, no needs for DME, good support system. Seat Belt Safety/Bike Helmet: Wears seat belt.     Objective:     Vitals: There were no vitals taken for this visit.  There is no height or weight on file to calculate BMI.  Advanced Directives 11/29/2018 05/17/2018 04/24/2018 01/23/2018 11/23/2017 05/20/2017 10/12/2016  Does Patient Have a Medical Advance Directive? No No No No No No No  Type of Advance Directive - - - - - - -  Does patient want to make changes to medical advance directive? - - - - Yes (ED - Information included in AVS) - -  Would patient like information on creating a medical advance directive? No - Patient declined - - No - Patient declined - No - Patient declined -    Tobacco Social History   Tobacco Use  Smoking Status Never Smoker  Smokeless Tobacco Never Used     Counseling given: Not Answered  Past Medical History:  Diagnosis Date  . Allergy    seasonal  . [place]   . Anxiety   .  Arthritis   . Bell palsy   . CTS (carpal tunnel syndrome)   . Cystitis   . Diverticulitis   . Diverticulosis of colon   . Dry eye syndrome   . Exocrine pancreatic insufficiency   . Frozen shoulder   . GERD (gastroesophageal reflux disease)   . HLA B27 (HLA B27 positive)   . Hx of colonic polyps   . Hyperlipidemia    NMR 2005  . Hypertension   . Hypothyroidism   . Incontinence   . Internal hemorrhoids   . Interstitial cystitis   . Kidney stone   . Menopause   . Osteoarthritis   . Osteopenia    [facility] . S/P total hysterectomy and bilateral salpingo-oophorectomy 04/12/2014  . Sinusitis   . Sleep apnea    c-pap  . Subjective visual disturbance of both eyes 12/30/2013  . Tubular adenoma of colon   . Vitamin D deficiency   . Xerostomia    and Xeroophthalmia   Past Surgical History:  Procedure Laterality Date  . BLADDER SURGERY     for incontinence  . CARPAL TUNNEL RELEASE Left 12/22/2014   Procedure: LEFT CARPAL TUNNEL RELEASE;  Surgeon: GDaryll Brod MD;  [place] [facility]  Service: Orthopedics;  Laterality: Left;  . COLONOSCOPY W/ POLYPECTOMY     adenomatous poyp 04-2008  . colonscopy     tics 10-2002  . CYSTOSCOPY WITH RETROGRADE PYELOGRAM, URETEROSCOPY AND STENT PLACEMENT Left  09/01/2013   Procedure: CYSTOSCOPY WITH RETROGRADE PYELOGRAM, AND LEFT STENT PLACEMENT;  Surgeon: Molli Hazard, MD;  Location: [facility];  Service: Urology;  Laterality: Left;  . elevated LFTs     due to [place]  . FINGER SURGERY Left 2004   2nd finger  . G 1 P 1    . interstitial cystitis     with clot in catheter  . PARTIAL HYSTERECTOMY     with one ovary left  . SHOULDER ARTHROSCOPY Left   . TOTAL ABDOMINAL HYSTERECTOMY W/ BILATERAL SALPINGOOPHORECTOMY     dysfunctional menses   Family History  Problem Relation Age of Onset  . Coronary artery disease Mother   . Hypertension Mother   . Asthma Mother   . Dementia Mother   . Stroke  Mother        mini cva  . Diabetes Father   . Cirrhosis Father        non alcoholic  . Colon cancer Sister 18  . Allergic rhinitis Sister   . Allergic rhinitis Sister   . Food Allergy Sister   . Renal cancer Paternal Aunt        x 2  . Coronary artery disease Maternal Grandfather   . Breast cancer Maternal Aunt   . Diabetes Paternal Aunt   . Diabetes Paternal Grandmother   . Esophageal cancer Neg Hx   . Stomach cancer Neg Hx    Social History   Socioeconomic History  . Marital status: Married    Spouse name: Not on file  . Number of children: 1  . Years of education: BS  . Highest education level: Not on file  Occupational History  . Occupation: retired  . Occupation: Pharmacist, hospital    Comment: Kindergarten  Social Needs  . Financial resource strain: Not hard at all  . Food insecurity:    Worry: Never true    Inability: Never true  . Transportation needs:    Medical: No    Non-medical: No  Tobacco Use  . Smoking status: Never Smoker  . Smokeless tobacco: Never Used  Substance and Sexual Activity  . Alcohol use: Not Currently    Comment:      . Drug use: No  . Sexual activity: Not Currently  Lifestyle  . Physical activity:    Days per week: 0 days    Minutes per session: 0 min  . Stress: Rather much  Relationships  . Social connections:    Talks on phone: More than three times a week    Gets together: More than three times a week    Attends religious service: Not on file    Active member of club or organization: Not on file    Attends meetings of clubs or organizations: Not on file    Relationship status: Married  Other Topics Concern  . Not on file  Social History Narrative   Lives   Caffeine use:    Married       Outpatient Encounter Medications as of 11/29/2018  Medication Sig  . acetaminophen (TYLENOL) 500 MG tablet Take 1,000 mg by mouth 3 (three) times daily as needed for mild pain.   . Ascorbic Acid (VITAMIN C) 1000 MG tablet Take 1,000 mg by mouth  daily.   Marland Kitchen aspirin 81 MG tablet Take 81 mg by mouth daily.    Marland Kitchen azelastine (ASTELIN) 0.1 % nasal spray One to two sprays each nostril twice a day as needed.  . blood glucose meter  kit and supplies KIT Dispense based on patient and insurance preference. Use up to four times daily as directed. prediabetes  . cetirizine (ZYRTEC) 10 MG tablet Take 10 mg by mouth daily.  . Cholecalciferol (VITAMIN D-3) 25 MCG (1000 UT) CAPS Take 5,000 Units by mouth daily.  Marland Kitchen CREON 36000 units CPEP capsule TAKE 2 CAPSULES ( 72,000 UNITS TOTAL ) THREE TIMES A DAY WITH MEALS AND 1 CAPSULE WITH SNACKS ( MAX 8 CAPSULES DAILY ) DISCONTINUE ZENPEP  . Diclofenac Sodium 2 % SOLN Place onto the skin as directed for pain as needed.  . diphenoxylate-atropine (LOMOTIL) 2.5-0.025 MG tablet Take 1 tablet by mouth 4 (four) times daily as needed for diarrhea or loose stools.  Marland Kitchen EPINEPHrine (EPIPEN 2-PAK) 0.3 mg/0.3 mL IJ SOAJ injection Use as directed for severe allergic reactions  . Magnesium 200 MG TABS Take 1 tablet (200 mg total) by mouth daily.  . meloxicam (MOBIC) 15 MG tablet TAKE 1 TABLET (15 MG TOTAL) BY MOUTH DAILY AS NEEDED FOR PAIN. WITH FOOD  . Olopatadine HCl 0.7 % SOLN Place 1 drop into both eyes as needed.  . Omega-3 Fatty Acids (FISH OIL BURP-LESS) 1000 MG CAPS Takes 1000 mg BID  . omeprazole (PRILOSEC) 40 MG capsule Take 1 capsule (40 mg total) by mouth daily.  Vladimir Faster Glycol-Propyl Glycol (SYSTANE OP) Apply 1 drop to eye as needed. Apply to both eyes as needed  . rosuvastatin (CRESTOR) 5 MG tablet TAKE 1 TABLET (5 MG TOTAL) BY MOUTH DAILY.  Marland Kitchen spironolactone (ALDACTONE) 25 MG tablet Take 1 tablet (25 mg total) by mouth daily.  . traMADol (ULTRAM) 50 MG tablet Take 50 mg by mouth as needed.   . vitamin B-12 (CYANOCOBALAMIN) 1000 MCG tablet Take 1,000 mcg by mouth daily.   . vitamin E 400 UNIT capsule Take 400 Units by mouth daily.   No facility-administered encounter medications on file as of 11/29/2018.      Activities of Daily Living In your present state of health, do you have any difficulty performing the following activities: 11/29/2018  Hearing? N  Vision? N  Difficulty concentrating or making decisions? N  Walking or climbing stairs? N  Dressing or bathing? N  Doing errands, shopping? N  Preparing Food and eating ? N  Using the Toilet? N  In the past six months, have you accidently leaked urine? N  Do you have problems with loss of bowel control? N  Managing your Medications? N  Managing your Finances? N  Housekeeping or managing your Housekeeping? N  Some recent data might be hidden    Patient Care Team: Binnie Rail, MD as PCP - General (Internal Medicine) Dorothy Spark, MD as PCP - Cardiology (Cardiology) Pyrtle, Lajuan Lines, MD as Consulting Physician (Gastroenterology) Lyndal Pulley, DO as Consulting Physician (Family Medicine) Rutherford Guys, MD as Consulting Physician (Ophthalmology)    Assessment:   This is a routine wellness examination for [place]. Physical assessment deferred to PCP.  Exercise Activities and Dietary recommendations Current Exercise Habits: Home exercise routine, Type of exercise: walking, Time (Minutes): 40, Frequency (Times/Week): 5, Weekly Exercise (Minutes/Week): 200, Intensity: Mild, Exercise limited by: orthopedic condition(s)  Diet (meal preparation, eat out, water intake, caffeinated beverages, dairy products, fruits and vegetables): in general, a "healthy" diet  , well balanced   Reviewed heart healthy and diabetic diet. Encouraged patient to increase daily water and healthy fluid intake.  Goals    . Patient Stated     Increase my  physical activity by walking more and exercise at home. Eat healthier by monitoring cholesterol, sugar and carbohydrates. Take a few minutes daily to do something just for me.       Fall Risk Fall Risk  11/29/2018 11/23/2017 05/02/2017 06/02/2015 04/08/2014  Falls in the past year? 0 No No No No  Number falls in  past yr: 0 - - - -   Depression Screen PHQ 2/9 Scores 11/29/2018 11/23/2017 05/02/2017 06/02/2015  PHQ - 2 Score 2 2 0 1  PHQ- 9 Score 2 3 - -     Cognitive Function       Ad8 score reviewed for issues:  Issues making decisions: no  Less interest in hobbies / activities: no  Repeats questions, stories (family complaining): no  Trouble using ordinary gadgets (microwave, computer, phone):no  Forgets the month or year: no  Mismanaging finances: no  Remembering appts: no  Daily problems with thinking and/or memory: no Ad8 score is= 0  Immunization History  Administered Date(s) Administered  . Influenza Split 05/09/2011, 05/07/2012  . Influenza Whole 08/01/2003, 05/29/2007, 04/15/2008, 04/30/2009, 05/03/2010  . Influenza, High Dose Seasonal PF 05/01/2013, 04/18/2014, 04/13/2016, 05/02/2017, 04/15/2018  . Influenza,inj,Quad PF,6+ Mos 05/21/2015  . Pneumococcal Conjugate-13 02/25/2015  . Pneumococcal Polysaccharide-23 07/31/2005, 05/27/2012  . Td 06/27/2010  . Zoster 07/19/2012  . Zoster Recombinat (Shingrix) 06/15/2017, 08/20/2017   Screening Tests Health Maintenance  Topic Date Due  . FOOT EXAM  11/02/2018  . HEMOGLOBIN A1C  11/02/2018  . URINE MICROALBUMIN  11/02/2018  . INFLUENZA VACCINE  03/01/2019  . OPHTHALMOLOGY EXAM  05/14/2019  . MAMMOGRAM  10/19/2019  . DEXA SCAN  04/16/2020  . TETANUS/TDAP  06/27/2020  . COLONOSCOPY  01/24/2023  . Hepatitis C Screening  Completed  . PNA vac Low Risk Adult  Completed      Plan:    Reviewed health maintenance screenings with patient today and relevant education, vaccines, and/or referrals were provided.   Continue doing brain stimulating activities (puzzles, reading, adult coloring books, staying active) to keep memory sharp.   Continue to eat heart healthy diet (full of fruits, vegetables, whole grains, lean protein, water--limit salt, fat, and sugar intake) and increase physical activity as tolerated.  I have  personally reviewed and noted the following in the patient's chart:   . Medical and social history . Use of alcohol, tobacco or illicit drugs  . Current medications and supplements . Functional ability and status . Nutritional status . Physical activity . Advanced directives . List of other physicians . Screenings to include cognitive, depression, and falls . Referrals and appointments  In addition, I have reviewed and discussed with patient certain preventive protocols, quality metrics, and best practice recommendations. A written personalized care plan for preventive services as well as general preventive health recommendations were provided to patient.     Michiel Cowboy, RN  11/29/2018    Medical screening examination/treatment/procedure(s) were performed by non-physician practitioner and as supervising physician I was immediately available for consultation/collaboration. I agree with above. Binnie Rail, MD

## 2018-11-28 NOTE — Progress Notes (Signed)
Subjective:    Patient ID: [name], female    DOB: August 17, 1945, 73 y.o.   MRN: [id number] This service was provided via telemedicine.  Visit via telephone The patient was located at home The provider was located in provider's GI office. The patient did consent to this telephone visit and is aware of possible charges through their insurance for this visit.   The persons participating in this telemedicine service were the patient and I. Time spent on call: 22 min   HPI [name] is a 73 year old female with a history of IBS with overlap exocrine pancreatic insufficiency, history of colon polyps, history of colonic diverticulosis with history of diverticulitis, GERD who is seen virtually for follow-up.  She was last seen in the office on 06/20/2018.  She also has a history of RLS, OSA, osteoporosis, osteoarthritis, hyperlipidemia, diabetes.  She reports that on the whole she has been doing fairly well from a GI perspective.  Her loose stools and intermittent fecal incontinence/smearing have improved dramatically.  She is only using Creon now if she has a breakthrough with loose stool.  She had several days of nausea symptom occurring in the evening between 6 and 8 PM and even at bedtime.  On one occasion she woke up feeling nauseous.  She used Zofran after discussing this with Dr. Quay Burow, primary care.  Zofran helped and has not recurred recently.  She wishes to discuss recommended calcium for her osteoporosis though this causes constipation.  She tried 1200 and even 600 mg a day has caused some constipation.  Magnesium is recently been prescribed for leg cramps but she is concerned that this will bring her diarrhea back.  She also voices caregiver stress and taking care of her husband who has multiple medical issues.  She has considered sertraline and would like my opinion.  She has ongoing issues with thinning hair and she wonders how to deal with this.  She is also considered the Golo diet  and the supplement product recommended with this diet.  She has not started the supplement yet.   Review of Systems As per HPI, otherwise negative  Current Medications, Allergies, Past Medical History, Past Surgical History, Family History and Social History were reviewed in [employer] record.     Objective:   Physical Exam Physical exam, virtual visit  CBC    Component Value Date/Time   WBC 6.4 07/18/2018 1218   RBC 4.20 07/18/2018 1218   HGB 12.2 07/18/2018 1218   HCT 37.4 07/18/2018 1218   PLT 252 07/18/2018 1218   MCV 89.0 07/18/2018 1218   MCH 29.0 07/18/2018 1218   MCHC 32.6 07/18/2018 1218   RDW 12.7 07/18/2018 1218   LYMPHSABS 1,574 07/18/2018 1218   MONOABS 0.5 07/18/2018 1152   EOSABS 288 07/18/2018 1218   BASOSABS 38 07/18/2018 1218   CMP     Component Value Date/Time   NA 142 08/20/2018 1007   K 4.4 08/20/2018 1007   CL 103 08/20/2018 1007   CO2 22 08/20/2018 1007   GLUCOSE 91 08/20/2018 1007   GLUCOSE 148 (H) 07/18/2018 1152   GLUCOSE 99 07/23/2006 1101   BUN 18 08/20/2018 1007   CREATININE 1.10 (H) 08/20/2018 1007   CREATININE 1.09 (H) 03/20/2018 0900   CALCIUM 9.9 08/20/2018 1007   PROT 7.8 07/18/2018 1152   PROT 6.8 08/06/2017 0924   ALBUMIN 4.5 07/18/2018 1152   ALBUMIN 4.4 08/06/2017 0924   AST 26 07/18/2018 1152   ALT  29 07/18/2018 1152   ALKPHOS 78 07/18/2018 1152   BILITOT 0.5 07/18/2018 1152   BILITOT 0.5 08/06/2017 0924   GFRNONAA 50 (L) 08/20/2018 1007   GFRNONAA 50 (L) 03/20/2018 0900   GFRAA 57 (L) 08/20/2018 1007   GFRAA 58 (L) 03/20/2018 0900       Assessment & Plan:   73 year old female with a history of IBS with overlap exocrine pancreatic insufficiency, history of colon polyps, history of colonic diverticulosis with history of diverticulitis, GERD who is seen virtually for follow-up.  1.  IBS with history of exocrine pancreatic insufficiency --symptoms have improved significantly.  Diarrhea has been  much less of an issue of late.  Previously very responsive to Creon.  Given improvement she can use Creon 2 capsules with meals and 1 with snacks on an as-needed basis. -We discussed how calcium supplementation can lead to constipation.  If 600 mg a day as her recommended dose perhaps she can try lower dose calcium separated 2 or 3 times a day. -We also discussed how magnesium can definitively cause diarrhea.  It is certainly okay to try the magnesium preparation recommended for leg cramping with the knowledge that diarrhea may result.  If it does she will likely discontinue this medication. -She also inquired about the product Golo.  I do not know this product but I did research it.  It has herbal remedies which I cannot vouch for.  It does also contain magnesium which again may worsen diarrhea.  Cautioned her with this product  2.  History of GERD and globus sensation --doing well.  Continue omeprazole when necessary.  3.  Hair loss --I recommended a multivitamin to ensure no trace vitamin and mineral deficiencies.  4.  Caregiver stress --considerable issue for her and certainly affects her other medical conditions.  She is discussed this with Dr. Quay Burow.  She wanted my opinion regarding possible sertraline.  We discussed this today and I think it would be a good option for her.  She will discuss further with Dr. Quay Burow  5.  History of colon polyps --on a 5-year surveillance interval; recall in place  25 minutes spent today. Greater than 50% was spent in counseling and coordination of care with the patient

## 2018-11-29 ENCOUNTER — Ambulatory Visit (INDEPENDENT_AMBULATORY_CARE_PROVIDER_SITE_OTHER): Payer: Medicare Other | Admitting: *Deleted

## 2018-11-29 ENCOUNTER — Ambulatory Visit: Payer: Self-pay | Admitting: Internal Medicine

## 2018-11-29 DIAGNOSIS — Z0000 Encounter for general adult medical examination without abnormal findings: Secondary | ICD-10-CM

## 2018-11-29 NOTE — Patient Instructions (Signed)
Continue doing brain stimulating activities (puzzles, reading, adult coloring books, staying active) to keep memory sharp.   Continue to eat heart healthy diet (full of fruits, vegetables, whole grains, lean protein, water--limit salt, fat, and sugar intake) and increase physical activity as tolerated.   [name] , Thank you for taking time to come for your Medicare Wellness Visit. I appreciate your ongoing commitment to your health goals. Please review the following plan we discussed and let me know if I can assist you in the future.   These are the goals we discussed: Goals    . Patient Stated     Increase my physical activity by walking more and exercise at home. Eat healthier by monitoring cholesterol, sugar and carbohydrates. Take a few minutes daily to do something just for me.       This is a list of the screening recommended for you and due dates:  Health Maintenance  Topic Date Due  . Complete foot exam   11/02/2018  . Hemoglobin A1C  11/02/2018  . Urine Protein Check  11/02/2018  . Flu Shot  03/01/2019  . Eye exam for diabetics  05/14/2019  . Mammogram  10/19/2019  . DEXA scan (bone density measurement)  04/16/2020  . Tetanus Vaccine  06/27/2020  . Colon Cancer Screening  01/24/2023  .  Hepatitis C: One time screening is recommended by Center for Disease Control  (CDC) for  adults born from [birth year] through 1967.   Completed  . Pneumonia vaccines  Completed    Preventive Care 49 Years and Older, Female Preventive care refers to lifestyle choices and visits with your health care provider that can promote health and wellness. What does preventive care include?  A yearly physical exam. This is also called an annual well check.  Dental exams once or twice a year.  Routine eye exams. Ask your health care provider how often you should have your eyes checked.  Personal lifestyle choices, including: ? Daily care of your teeth and gums. ? Regular physical activity. ? Eating  a healthy diet. ? Avoiding tobacco and drug use. ? Limiting alcohol use. ? Practicing safe sex. ? Taking low-dose aspirin every day. ? Taking vitamin and mineral supplements as recommended by your health care provider. What happens during an annual well check? The services and screenings done by your health care provider during your annual well check will depend on your age, overall health, lifestyle risk factors, and family history of disease. Counseling Your health care provider may ask you questions about your:  Alcohol use.  Tobacco use.  Drug use.  Emotional well-being.  Home and relationship well-being.  Sexual activity.  Eating habits.  History of falls.  Memory and ability to understand (cognition).  Work and work Statistician.  Reproductive health.  Screening You may have the following tests or measurements:  Height, weight, and BMI.  Blood pressure.  Lipid and cholesterol levels. These may be checked every 5 years, or more frequently if you are over 71 years old.  Skin check.  Lung cancer screening. You may have this screening every year starting at age 31 if you have a 30-pack-year history of smoking and currently smoke or have quit within the past 15 years.  Colorectal cancer screening. All adults should have this screening starting at age 76 and continuing until age 78. You will have tests every 1-10 years, depending on your results and the type of screening test. People at increased risk should start screening at an  earlier age. Screening tests may include: ? Guaiac-based fecal occult blood testing. ? Fecal immunochemical test (FIT). ? Stool DNA test. ? Virtual colonoscopy. ? Sigmoidoscopy. During this test, a flexible tube with a tiny camera (sigmoidoscope) is used to examine your rectum and lower colon. The sigmoidoscope is inserted through your anus into your rectum and lower colon. ? Colonoscopy. During this test, a long, thin, flexible tube with a  tiny camera (colonoscope) is used to examine your entire colon and rectum.  Hepatitis C blood test.  Hepatitis B blood test.  Sexually transmitted disease (STD) testing.  Diabetes screening. This is done by checking your blood sugar (glucose) after you have not eaten for a while (fasting). You may have this done every 1-3 years.  Bone density scan. This is done to screen for osteoporosis. You may have this done starting at age 54.  Mammogram. This may be done every 1-2 years. Talk to your health care provider about how often you should have regular mammograms. Talk with your health care provider about your test results, treatment options, and if necessary, the need for more tests. Vaccines Your health care provider may recommend certain vaccines, such as:  Influenza vaccine. This is recommended every year.  Tetanus, diphtheria, and acellular pertussis (Tdap, Td) vaccine. You may need a Td booster every 10 years.  Varicella vaccine. You may need this if you have not been vaccinated.  Zoster vaccine. You may need this after age 59.  Measles, mumps, and rubella (MMR) vaccine. You may need at least one dose of MMR if you were born in 1957 or later. You may also need a second dose.  Pneumococcal 13-valent conjugate (PCV13) vaccine. One dose is recommended after age 2.  Pneumococcal polysaccharide (PPSV23) vaccine. One dose is recommended after age 110.  Meningococcal vaccine. You may need this if you have certain conditions.  Hepatitis A vaccine. You may need this if you have certain conditions or if you travel or work in places where you may be exposed to hepatitis A.  Hepatitis B vaccine. You may need this if you have certain conditions or if you travel or work in places where you may be exposed to hepatitis B.  Haemophilus influenzae type b (Hib) vaccine. You may need this if you have certain conditions. Talk to your health care provider about which screenings and vaccines you  need and how often you need them. This information is not intended to replace advice given to you by your health care provider. Make sure you discuss any questions you have with your health care provider. Document Released: 08/13/2015 Document Revised: 09/06/2017 Document Reviewed: 05/18/2015 Elsevier Interactive Patient Education  2019 [employer].

## 2018-12-02 MED FILL — PAZEO 0.7% EYE DROPS: 0.7 | 25 days supply | Qty: 3 | Fill #2

## 2018-12-02 MED FILL — CICLOPIROX 8% SOLUTION: 8 | 30 days supply | Qty: 7 | Fill #1

## 2018-12-02 NOTE — Progress Notes (Signed)
Virtual Visit via Video Note  I connected with [name] on 12/03/18 at  9:00 AM EDT by a video enabled telemedicine application and verified that I am speaking with the correct person using two identifiers.   I discussed the limitations of evaluation and management by telemedicine and the availability of in person appointments. The patient expressed understanding and agreed to proceed.  The patient is currently at home and I am in the office.    No referring provider.    History of Present Illness: She is here for an acute visit for anxiety.   Anxiety:   She feels anxious and can cry easily.  She does not necessarily feel depressed.  Her concentration is not good.   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  She has thought about going on medication recently, but has not wanted to do it.  She did have a recent visit with GI and it was explained that some of her stomach issues could be related to anxiety or may just improve with an SSRI.  She thinks she would like to try 1.  She was prescribed sertraline 25 mg daily in the past and wonders if that would be something that she could try.  She is concerned about possible side effects.  She denies any effects on her sleep or appetite.     Review of Systems  Constitutional: Negative for fever.  Respiratory: Positive for cough (residual - mild, dry). Negative for shortness of breath and wheezing.   Cardiovascular: Negative for chest pain.  Musculoskeletal: Positive for back pain and joint pain.  Psychiatric/Behavioral: Negative for depression. The patient is nervous/anxious. The patient does not have insomnia.      Social History   Socioeconomic History  . Marital status: Married    Spouse name: Not on file  . Number of children: 1  . Years of education: BS  . Highest education level: Not on file  Occupational History  . Occupation: retired  . Occupation: Pharmacist, hospital    Comment: Kindergarten   Social Needs  . Financial resource strain: Not hard at all  . Food insecurity:    Worry: Never true    Inability: Never true  . Transportation needs:    Medical: No    Non-medical: No  Tobacco Use  . Smoking status: Never Smoker  . Smokeless tobacco: Never Used  Substance and Sexual Activity  . Alcohol use: Not Currently    Comment:      . Drug use: No  . Sexual activity: Not Currently  Lifestyle  . Physical activity:    Days per week: 0 days    Minutes per session: 0 min  . Stress: Rather much  Relationships  . Social connections:    Talks on phone: More than three times a week    Gets together: More than three times a week    Attends religious service: Not on file    Active member of club or organization: Not on file    Attends meetings of clubs or organizations: Not on file    Relationship status: Married  Other Topics Concern  . Not on file  Social History Narrative   Lives   Caffeine use:    Married        Observations/Objective: Appears well in NAD Normal mood, affect and thinking.  Does not appear overly anxious at this time.  Assessment and Plan:  See Problem List for Assessment and Plan  of chronic medical problems.   Follow Up Instructions:    I discussed the assessment and treatment plan with the patient. The patient was provided an opportunity to ask questions and all were answered. The patient agreed with the plan and demonstrated an understanding of the instructions.   The patient was advised to call back or seek an in-person evaluation if the symptoms worsen or if the condition fails to improve as anticipated.    Binnie Rail, MD

## 2018-12-03 ENCOUNTER — Encounter: Payer: Self-pay | Admitting: Internal Medicine

## 2018-12-03 ENCOUNTER — Ambulatory Visit (INDEPENDENT_AMBULATORY_CARE_PROVIDER_SITE_OTHER): Payer: Medicare Other | Admitting: Internal Medicine

## 2018-12-03 DIAGNOSIS — I2510 Atherosclerotic heart disease of native coronary artery without angina pectoris: Secondary | ICD-10-CM | POA: Diagnosis not present

## 2018-12-03 DIAGNOSIS — F419 Anxiety disorder, unspecified: Secondary | ICD-10-CM | POA: Diagnosis not present

## 2018-12-03 MED ORDER — SERTRALINE HCL 25 MG PO TABS: 25.0000 mg | ORAL_TABLET | Freq: Every day | ORAL | 5 refills | Status: DC

## 2018-12-03 MED FILL — SERTRALINE HCL 25 MG TABLET: 25 | 30 days supply | Qty: 30 | Fill #0

## 2018-12-03 NOTE — Assessment & Plan Note (Signed)
She is her husband's primary caregiver and that has resulted in increased anxiety and stress over the past several years Several of her other doctors have pointed this out to her and I think she finally realizes that this is affecting her health and that she may benefit from medication We will start sertraline 25 mg nightly and titrate if needed Discussed possible side effects This point I do not think there is an element of depression Encouraged regular exercise She has seen a counselor in the past not something that we can consider again in the near future

## 2018-12-05 ENCOUNTER — Telehealth: Payer: Self-pay | Admitting: Cardiology

## 2018-12-05 NOTE — Telephone Encounter (Signed)
Pt is calling in today to inform Dr Meda Coffee of side effects she has been experiencing since she started taking spironolactone 25 mg po daily, as ordered at her last virtual visit. Pt states that she has also been keeping a log of her BPs. Pt states that since she started taking the medicine, she has felt more fatigued, felt weak, had chills with no fever (takes temp everyday and its 98.1), and her joints hurt.  Pt states that she then decided to hold 2 medicines over the last 2 days, instead of holding the most recent start med, sprionolactone.  Pt states she held both spironolactone and rosuvastatin, and now her side effects are completely gone.  Pt unsure which med is causing the issue. Off note, pt has been on rosuvastatin since 2019.  Pt states that she has monitored her bp with the meds and without the meds and here are the last 3 readings: 12/03/18-114/82-with meds;   12/04/18-141/83 without meds; 12/05/18-137/85-without meds. Pt is unsure what to do.  She is asking should she stop crestor and spironolactone, continue taking meds, or have Dr Meda Coffee change the regimen.  Endorsed to the pt that she has been taking crestor since 2019, and more than likely her side effects with that med would have occurred early on when she started taking the medicine. Endorsed to the pt that the most recent med spironolactone could be causing the issue, but I would need Dr. Meda Coffee and maybe our Pharmacist to look more into this. Pt states she is super sensitive with medications, and historically has issues with all diuretics she has been on in the past.   Pt is worried her BP will go back up without the right med, as well as her ankles will swell again, without the right diuretic.  Informed the pt that I will need to route this communication to Dr Meda Coffee and our Pharmacist who have seen her in BP clinic, to further review and advise on this issue.  Informed the pt that once I receive recommendations, we will follow-up shortly  with her thereafter.   Pt verbalized understanding and agrees with this plan.  Spent over 20 mins on the phone with this pt about these issue.

## 2018-12-05 NOTE — Telephone Encounter (Signed)
Since her symptoms resolved after stopping spironolactone and rosuvastatin, it is likely that one of these meds is contributing to her symptoms. Statins can cause side effects even if patients have been taking them for a while - this matches her side effect profile as well. Would recommend resuming spironolactone, stopping rosuvastatin, and monitoring symptoms over the next few days. If symptoms resolve, would trial low dose pravastatin 20mg  HS to see if she tolerates this better.

## 2018-12-05 NOTE — Telephone Encounter (Signed)
New Message   Pt c/o medication issue:  1. Name of Medication: spironolactone (ALDACTONE) 25 MG tablet    2. How are you currently taking this medication (dosage and times per day)?   3. Are you having a reaction (difficulty breathing--STAT)?   4. What is your medication issue? Patient is calling about possible side effects of this medication. She did not get into detail about the issues she is having she only wanted to speak with the nurse.

## 2018-12-05 NOTE — Telephone Encounter (Signed)
Thank you Megan.

## 2018-12-05 NOTE — Telephone Encounter (Signed)
Spoke with the pt and endorsed to her recommendations per [place] PharmD and Dr Meda Coffee. Endorsed to the pt to stop taking rosuvastatin and continue taking spironolactone, monitor her symptoms over the next few days, and and call us next Tuesday to report how she feels.  Informed the pt that if her symptoms resolve, we can try a trial low dose of pravastatin 20 mg po HS, to see if she tolerates this better.  Pt verbalized understanding and agrees with this plan.  Pt states she will call to let us know how she feels next Tuesday 5/12.  Pt more than gracious for all the assistance provided.  Will discontinue her rosuvastatin from her med list, as advised by Petroni Canada PharmD and Dr Meda Coffee.

## 2018-12-10 MED ORDER — HYDROCHLOROTHIAZIDE 25 MG PO TABS: 25.0000 mg | ORAL_TABLET | Freq: Every day | ORAL | 0 refills | Status: DC

## 2018-12-10 MED FILL — HYDROCHLOROTHIAZIDE 25 MG T: 25 | 30 days supply | Qty: 30 | Fill #0

## 2018-12-10 NOTE — Telephone Encounter (Signed)
Spoke with the pt and informed her that per [place] Pharmacist her complaints of spironolactone are unusual but we can stop her spironolactone and start  HCTZ 25 mg po daily, and see if she notices any improvement in symptoms.   Endorsed to the pt that if she is feeling fine in 1 week after starting back HCTZ, then she could try to resume rosuvastatin since she noted no difference in symptoms when holding this med for 3 days.  Endorsed to the pt that if her symptoms return, [name] Blossom suggest we change her statin to pravastatin 20 mg every evening.  Advised the pt to call back next week to report how her symptoms are, so we can advise on treatment mentioned in this message.  Confirmed the pharmacy of choice with the pt.  Pt would like to retry HCTZ and stop spironolactone. Pt verbalized understanding and agrees with this plan.

## 2018-12-10 NOTE — Addendum Note (Signed)
Addended by: Nuala Alpha on: 12/10/2018 05:25 PM   Modules accepted: Orders

## 2018-12-10 NOTE — Telephone Encounter (Signed)
Very unusual for spironolactone to cause leg cramps, pain, or restless leg syndrome. Dr Francesca Oman note from 4/27 virtual visit also mentions pt reports of night time leg cramping which was before spironolactone was started so I am unsure of symptom correlation with spironolactone. Since she noted some swelling at that time, would be ok to stop spironolactone and start HCTZ 25mg  daily to see if she notices any improvement in symptoms. If she is still feeling fine 1 week after starting HCTZ (which she has previously taken), could try resuming rosuvastatin since she noted no difference in symptoms when holding this med for 3 days. If symptoms return, would recommend changing to pravastatin 20mg  every evening.

## 2018-12-10 NOTE — Telephone Encounter (Signed)
Follow up   Patient is returning call for Ivy to do a follow up on medication per the below message. Please call.

## 2018-12-10 NOTE — Telephone Encounter (Signed)
Pt is calling back as instructed by [place] Pharmacist and Dr. Meda Coffee, on 12/05/18, and as indicated in this note:  Per the pt, she stopped the rosuvastatin when we advised her to and has remained off this medication. Pt continued taking spironolactone on May 8, 9, and 10th, and experienced bilateral leg pain and cramps, and what she reports as very bad restless leg syndrome.  Pt states she STOPPED taking spironolactone on May 11 and today, and has continued to remain off of rosuvastatin, and her leg pain and cramps have completely resolved.   Pt states that she has had no lower extremity swelling yesterday or today, with being off the spironolactone. Pt states her average BP today is 142/84 and HR-65.   Informed the pt that this was very helpful information and I will route this communication back to Dr Meda Coffee and [place] Pharmacist, for further review and recommendation, and follow-up with the pt thereafter. Pt verbalized understanding and agrees with this plan.

## 2018-12-12 NOTE — Telephone Encounter (Signed)
Pt is calling in regards to this medication issue. She wanted clarification as to what the current dosage Dr. Meda Coffee wants her to be on.  She also would like to know if she should still be taking her potassium.

## 2018-12-12 NOTE — Telephone Encounter (Signed)
Went over the pts current med list over the phone and allowed her to write this down.  Advised the pt that we took her off of the spironolactone, and restarted her HCTZ 25 mg po daily, as well as advised her to remain off of her rosuvastatin, and call us next week to report how her symptoms are. Pt verbalized understanding now, and agrees with this plan.

## 2018-12-13 ENCOUNTER — Telehealth: Payer: Self-pay | Admitting: Internal Medicine

## 2018-12-13 NOTE — Telephone Encounter (Signed)
Dr. Hilarie Fredrickson, Is this something you and Mrs. Doswell discussed? I read your last office note and did not see anything regarding a referral.

## 2018-12-13 NOTE — Telephone Encounter (Signed)
Pt stated that she has found a rheumatologist and that her practice requires a referral: Dr. Lynwood Dawley at [facility] in [place], [place].

## 2018-12-16 NOTE — Telephone Encounter (Signed)
I am okay with this referral

## 2018-12-16 NOTE — Telephone Encounter (Signed)
Called [facility] and they state that Dr. Alroy Dust is not accepting new patients at this time. Other physicians in the practice if patient is interested. Called patient to inform her and she states that doctor was highly recommended by a friend so she is not sure who else to pick. Patient states she will do some research and call our office back.

## 2018-12-19 ENCOUNTER — Encounter: Payer: Self-pay | Admitting: Internal Medicine

## 2018-12-19 ENCOUNTER — Ambulatory Visit (INDEPENDENT_AMBULATORY_CARE_PROVIDER_SITE_OTHER): Payer: Medicare Other | Admitting: Internal Medicine

## 2018-12-19 DIAGNOSIS — I2510 Atherosclerotic heart disease of native coronary artery without angina pectoris: Secondary | ICD-10-CM

## 2018-12-19 DIAGNOSIS — M2550 Pain in unspecified joint: Secondary | ICD-10-CM | POA: Diagnosis not present

## 2018-12-19 DIAGNOSIS — R609 Edema, unspecified: Secondary | ICD-10-CM | POA: Insufficient documentation

## 2018-12-19 DIAGNOSIS — I10 Essential (primary) hypertension: Secondary | ICD-10-CM | POA: Diagnosis not present

## 2018-12-19 DIAGNOSIS — E119 Type 2 diabetes mellitus without complications: Secondary | ICD-10-CM | POA: Diagnosis not present

## 2018-12-19 DIAGNOSIS — G4733 Obstructive sleep apnea (adult) (pediatric): Secondary | ICD-10-CM

## 2018-12-19 DIAGNOSIS — K219 Gastro-esophageal reflux disease without esophagitis: Secondary | ICD-10-CM | POA: Diagnosis not present

## 2018-12-19 DIAGNOSIS — R5383 Other fatigue: Secondary | ICD-10-CM

## 2018-12-19 MED ORDER — HYDROCHLOROTHIAZIDE 25 MG PO TABS: 12.5000 mg | ORAL_TABLET | Freq: Every day | ORAL | 0 refills | Status: DC

## 2018-12-19 NOTE — Telephone Encounter (Signed)
Refer to [employer] from 5/20, where pt requested that Dr Meda Coffee decrease her HCTZ to 12.5 mg po daily, for mentioned issues with the 25 mg dose. Dr Delight Ovens this and pt is aware.  This was reflected in her med list.  She is to call or mychart [place] back with any other issues with this.

## 2018-12-19 NOTE — Progress Notes (Signed)
Virtual Visit via Video Note  I connected with [name] on 12/19/18 at  3:45 PM EDT by a video enabled telemedicine application and verified that I am speaking with the correct person using two identifiers.   I discussed the limitations of evaluation and management by telemedicine and the availability of in person appointments. The patient expressed understanding and agreed to proceed.  The patient is currently at home and I am in the office.    No referring provider.    History of Present Illness: This is an acute visit for increased spider veins, nausea, decreased appetite, swelling under both eyes and brain fog.    Spider veins:  Her mom had them and she knows it is generic, but has had more around the ankles.  She has had some increased leg swelling recently.  She does try to elevate her legs when possible, but sometimes that is uncomfortable.  Have eye swelling, hands and legs.  She has been talking with dr Meda Coffee and has had some of her blood pressure medication switched around.  She was having RLS symptoms in her legs which was new and her hydrochlorothiazide was changed to spironolactone.  It was later changed back to hydrochlorothiazide and the dose was increased to 25 mg daily.  She denies any increase in salt and think she is actually eating more healthy.  She has lost some weight.  She has not taken the hydrochlorothiazide yesterday or today, but does not see a big difference in the swelling when she takes it.  She is not currently taking the meloxicam-she takes Tylenol for her shoulder pain.  Fatigue: For the past 3 weeks she has been more fatigued.  She is unsure why.  She is using her CPAP nightly.  She does not relate the increased fatigue to any of her medications.  She is less hungry and not eating as much.  At home it was 156.3 lbs.  She is also been eating more healthy.  She is happy with the weight loss.  She still has occasional nausea.  She denies vomiting.  She  feels her GERD is well controlled.    She feels foggy in her thinking.  It may have started about three weeks.  She denies any relation to changes in medication.  She is using the CPAP.  Chronic joint pain: She continues to have chronic joint pain.  She does have ankylosing spondylitis.  She has osteoarthritis.  She denies any significant changes in her pain.     Review of Systems  Constitutional: Negative for chills and fever.  Eyes: Negative for blurred vision and double vision.  Respiratory: Negative for cough, shortness of breath and wheezing.   Cardiovascular: Positive for leg swelling (hands and around eyes as well). Negative for chest pain and palpitations.  Gastrointestinal: Negative for heartburn.  Genitourinary: Negative for dysuria, frequency and hematuria.  Skin:       Nails have vertical ridges  Neurological: Positive for dizziness. Negative for tingling, weakness and headaches.      Social History   Socioeconomic History  . Marital status: Married    Spouse name: Not on file  . Number of children: 1  . Years of education: BS  . Highest education level: Not on file  Occupational History  . Occupation: retired  . Occupation: Pharmacist, hospital    Comment: Kindergarten  Social Needs  . Financial resource strain: Not hard at all  . Food insecurity:    Worry: Never true  Inability: Never true  . Transportation needs:    Medical: No    Non-medical: No  Tobacco Use  . Smoking status: Never Smoker  . Smokeless tobacco: Never Used  Substance and Sexual Activity  . Alcohol use: Not Currently    Comment:      . Drug use: No  . Sexual activity: Not Currently  Lifestyle  . Physical activity:    Days per week: 0 days    Minutes per session: 0 min  . Stress: Rather much  Relationships  . Social connections:    Talks on phone: More than three times a week    Gets together: More than three times a week    Attends religious service: Not on file    Active member of club  or organization: Not on file    Attends meetings of clubs or organizations: Not on file    Relationship status: Married  Other Topics Concern  . Not on file  Social History Narrative   Lives   Caffeine use:    Married        Observations/Objective: Appears well in NAD  BP 153/83, 149/94 - no hctz yesterday or today  Assessment and Plan:  See Problem List for Assessment and Plan of chronic medical problems.   Follow Up Instructions:    I discussed the assessment and treatment plan with the patient. The patient was provided an opportunity to ask questions and all were answered. The patient agreed with the plan and demonstrated an understanding of the instructions.   The patient was advised to call back or seek an in-person evaluation if the symptoms worsen or if the condition fails to improve as anticipated.    Binnie Rail, MD

## 2018-12-19 NOTE — Assessment & Plan Note (Signed)
Legs, hand and eyes No increase in salt Has not taken hctz x 2 days but she does not think that is related Will check cbc, cmp, tsh

## 2018-12-19 NOTE — Assessment & Plan Note (Signed)
Increased fatigue x 3 weeks ? Cause Using cpap Not related to medication ? Autoimmune Will r/o anemia, thyroid Cbc, cmp, tsh, autoimmune labs

## 2018-12-19 NOTE — Assessment & Plan Note (Signed)
Using cpap nightly Still fatigued

## 2018-12-19 NOTE — Assessment & Plan Note (Signed)
Chronic joint pain with OA, also has ankylosing spondylitis  ? Other autoimmune issue Check autoimmune labs - RA, CCP, esr, crp and ANA Sees rheum at [facility] - next appt in August

## 2018-12-19 NOTE — Assessment & Plan Note (Signed)
GERD controlled Continue daily medication  

## 2018-12-19 NOTE — Assessment & Plan Note (Signed)
BP elevated today at home but has not taken the hctz yesterday or today Advised to take hctz regularly to see if BP is controlled and swelling improves

## 2018-12-19 NOTE — Telephone Encounter (Signed)
Per Dr Meda Coffee, she okayed for the pt to decrease her HCTZ to 12.5 mg po daily.  Reflected this change in the pts med list.  Pt made aware that Dr Meda Coffee was ok with this.  Pt will call or mychart [place] with any further concerns.

## 2018-12-19 NOTE — Assessment & Plan Note (Signed)
Will check a1c since she is getting labs

## 2018-12-24 ENCOUNTER — Other Ambulatory Visit (INDEPENDENT_AMBULATORY_CARE_PROVIDER_SITE_OTHER): Payer: Medicare Other

## 2018-12-24 DIAGNOSIS — R5383 Other fatigue: Secondary | ICD-10-CM | POA: Diagnosis not present

## 2018-12-24 DIAGNOSIS — I10 Essential (primary) hypertension: Secondary | ICD-10-CM

## 2018-12-24 DIAGNOSIS — M2550 Pain in unspecified joint: Secondary | ICD-10-CM

## 2018-12-24 DIAGNOSIS — E119 Type 2 diabetes mellitus without complications: Secondary | ICD-10-CM | POA: Diagnosis not present

## 2018-12-24 DIAGNOSIS — R609 Edema, unspecified: Secondary | ICD-10-CM | POA: Diagnosis not present

## 2018-12-24 DIAGNOSIS — B351 Tinea unguium: Secondary | ICD-10-CM | POA: Diagnosis not present

## 2018-12-24 LAB — COMPREHENSIVE METABOLIC PANEL
ALT: 34 U/L (ref 0–35)
AST: 29 U/L (ref 0–37)
Albumin: 4.4 g/dL (ref 3.5–5.2)
Alkaline Phosphatase: 86 U/L (ref 39–117)
BUN: 24 mg/dL — ABNORMAL HIGH (ref 6–23)
CO2: 26 mEq/L (ref 19–32)
Calcium: 9.9 mg/dL (ref 8.4–10.5)
Chloride: 103 mEq/L (ref 96–112)
Creatinine, Ser: 1.17 mg/dL (ref 0.40–1.20)
GFR: 45.11 mL/min — ABNORMAL LOW (ref >60.00)
Glucose, Bld: 129 mg/dL — ABNORMAL HIGH (ref 70–99)
Potassium: 3.6 mEq/L (ref 3.5–5.1)
Sodium: 138 mEq/L (ref 135–145)
Total Bilirubin: 0.3 mg/dL (ref 0.2–1.2)
Total Protein: 7.2 g/dL (ref 6.0–8.3)

## 2018-12-24 LAB — CBC WITH DIFFERENTIAL/PLATELET
Basophils Absolute: 0 10*3/uL (ref 0.0–0.1)
Basophils Relative: 0.6 % (ref 0.0–3.0)
Eosinophils Absolute: 0.1 10*3/uL (ref 0.0–0.7)
Eosinophils Relative: 2.3 % (ref 0.0–5.0)
HCT: 36.3 % (ref 36.0–46.0)
Hemoglobin: 12.6 g/dL (ref 12.0–15.0)
Lymphocytes Relative: 32.1 % (ref 12.0–46.0)
Lymphs Abs: 1.5 10*3/uL (ref 0.7–4.0)
MCHC: 34.6 g/dL (ref 30.0–36.0)
MCV: 88.1 fl (ref 78.0–100.0)
Monocytes Absolute: 0.3 10*3/uL (ref 0.1–1.0)
Monocytes Relative: 7.1 % (ref 3.0–12.0)
Neutro Abs: 2.7 10*3/uL (ref 1.4–7.7)
Neutrophils Relative %: 57.9 % (ref 43.0–77.0)
Platelets: 233 10*3/uL (ref 150.0–400.0)
RBC: 4.12 Mil/uL (ref 3.87–5.11)
RDW: 13 % (ref 11.5–15.5)
WBC: 4.7 10*3/uL (ref 4.0–10.5)

## 2018-12-24 LAB — C-REACTIVE PROTEIN: CRP: <1 mg/dL (ref 0.5–20.0)

## 2018-12-24 LAB — TSH: TSH: 2.68 u[IU]/mL (ref 0.35–4.50)

## 2018-12-24 LAB — HEMOGLOBIN A1C: Hgb A1c MFr Bld: 6.2 % (ref 4.6–6.5)

## 2018-12-24 LAB — SEDIMENTATION RATE: Sed Rate: 9 mm/hr (ref 0–30)

## 2018-12-25 ENCOUNTER — Telehealth (INDEPENDENT_AMBULATORY_CARE_PROVIDER_SITE_OTHER): Payer: Medicare Other | Admitting: Cardiology

## 2018-12-25 ENCOUNTER — Encounter: Payer: Self-pay | Admitting: *Deleted

## 2018-12-25 ENCOUNTER — Encounter: Payer: Self-pay | Admitting: Cardiology

## 2018-12-25 ENCOUNTER — Telehealth (INDEPENDENT_AMBULATORY_CARE_PROVIDER_SITE_OTHER): Payer: Self-pay | Admitting: Pharmacist

## 2018-12-25 ENCOUNTER — Other Ambulatory Visit: Payer: Self-pay

## 2018-12-25 VITALS — BP 128/78 | HR 78 | Ht 62.5 in | Wt 156.0 lb

## 2018-12-25 DIAGNOSIS — E785 Hyperlipidemia, unspecified: Secondary | ICD-10-CM

## 2018-12-25 DIAGNOSIS — Z1589 Genetic susceptibility to other disease: Secondary | ICD-10-CM | POA: Diagnosis not present

## 2018-12-25 DIAGNOSIS — I2510 Atherosclerotic heart disease of native coronary artery without angina pectoris: Secondary | ICD-10-CM

## 2018-12-25 DIAGNOSIS — I739 Peripheral vascular disease, unspecified: Secondary | ICD-10-CM

## 2018-12-25 DIAGNOSIS — R079 Chest pain, unspecified: Secondary | ICD-10-CM | POA: Diagnosis not present

## 2018-12-25 DIAGNOSIS — I10 Essential (primary) hypertension: Secondary | ICD-10-CM | POA: Diagnosis not present

## 2018-12-25 DIAGNOSIS — I351 Nonrheumatic aortic (valve) insufficiency: Secondary | ICD-10-CM

## 2018-12-25 MED ORDER — ROSUVASTATIN CALCIUM 5 MG PO TABS: 5.0000 mg | ORAL_TABLET | ORAL | 2 refills | Status: DC

## 2018-12-25 NOTE — Progress Notes (Signed)
Patient ID: [name]                 DOB: 16-Sep-1945                    MRN: [id number]     HPI: [name] is a 73 y.o. female patient referred to lipid clinic by Dr. Meda Coffee. PMH is significant for hypertension and hyperlipidemia.   Visit was conducted via telemedicine due to national recommendations for restrictions regarding the COVID-19 pandemic in efforts to limit in office visits. Patient is currently being treated for hyperlipdemia with a family history of CAD and a calculated ASCVD risk of 18.7%. Patient has been taking rosuvastatin 5 mg daily for hyperlipidemia and has been in goal with an LDL of 65 on 05/03/18. She was experiencing some lower extremity cramping that improved with discontinuing rosuvastatin. Rosuvastatin dose was reduced to 5 mg TIW on 12/25/18.   Current Medications: Rosuvastatin 5 mg three times weekly (started 12/25/18) Intolerances: Rosuvastatin 5 mg daily (lower extremity cramps)  Risk Factors: F/H of CAD, Calculated ASCVD risk 18.7%  LDL goal: <100  Family History: The patient's family history includes Allergic rhinitis in her sister and sister; Asthma in her mother; Breast cancer in her maternal aunt; Cirrhosis in her father; Colon cancer (age of onset: 26) in her sister; Coronary artery disease in her maternal grandfather and mother; Dementia in her mother; Diabetes in her father, paternal aunt, and paternal grandmother; Food Allergy in her sister; Hypertension in her mother; Renal cancer in her paternal aunt; Stroke in her mother.   Social History: Denies smoking, alcohol use and drug use.   Labs: Lipid panel (05/03/18): TC 150, TG 95, HDL 65.9, LDL 65, non-HDL 83.97 (rosuvastatin 5 mg daily)   Past Medical History:  Diagnosis Date   Allergy    seasonal   Ankylosing spondylitis (HCC)    Anxiety    Arthritis    Bell palsy    CTS (carpal tunnel syndrome)    Cystitis    Diverticulitis    Diverticulosis of colon    Dry eye syndrome     Exocrine pancreatic insufficiency    Frozen shoulder    GERD (gastroesophageal reflux disease)    HLA B27 (HLA B27 positive)    Hx of colonic polyps    Hyperlipidemia    NMR 2005   Hypertension    Hypothyroidism    Incontinence    Internal hemorrhoids    Interstitial cystitis    Kidney stone    Menopause    Osteoarthritis    Osteopenia    [facility] [address]   S/P total hysterectomy and bilateral salpingo-oophorectomy 04/12/2014   Sinusitis    Sleep apnea    c-pap   Subjective visual disturbance of both eyes 12/30/2013   Tubular adenoma of colon    Vitamin D deficiency    Xerostomia    and Xeroophthalmia    Current Outpatient Medications on File Prior to Visit  Medication Sig Dispense Refill   acetaminophen (TYLENOL) 500 MG tablet Take 1,000 mg by mouth 3 (three) times daily as needed for mild pain.      Ascorbic Acid (VITAMIN C) 1000 MG tablet Take 1,000 mg by mouth daily.      aspirin 81 MG tablet Take 81 mg by mouth daily.       azelastine (ASTELIN) 0.1 % nasal spray One to two sprays each nostril twice a day as needed. [place]  mL 5   blood glucose meter kit and supplies KIT Dispense based on patient and insurance preference. Use up to four times daily as directed. prediabetes 1 each 0   cetirizine (ZYRTEC) 10 MG tablet Take 10 mg by mouth daily.     Cholecalciferol (VITAMIN D-3) 25 MCG (1000 UT) CAPS Take 5,000 Units by mouth daily.     ciclopirox (PENLAC) 8 % solution      Diclofenac Sodium 2 % SOLN Place onto the skin as directed for pain as needed.     diphenoxylate-atropine (LOMOTIL) 2.5-0.025 MG tablet Take 1 tablet by mouth 4 (four) times daily as needed for diarrhea or loose stools. 90 tablet 2   EPINEPHrine (EPIPEN 2-PAK) 0.3 mg/0.3 mL IJ SOAJ injection Use as directed for severe allergic reactions 2 Device 2   hydrochlorothiazide (HYDRODIURIL) 25 MG tablet Take 0.5 tablets (12.5 mg total) by mouth daily. 90 tablet 0    lipase/protease/amylase (CREON) 36000 UNITS CPEP capsule Take 36,000 Units by mouth as needed.     Magnesium 200 MG TABS Take 1 tablet (200 mg total) by mouth daily. (Patient not taking: Reported on 12/25/2018) 30 each 6   meloxicam (MOBIC) 15 MG tablet TAKE 1 TABLET (15 MG TOTAL) BY MOUTH DAILY AS NEEDED FOR PAIN. WITH FOOD 90 tablet 2   Olopatadine HCl 0.7 % SOLN Place 1 drop into both eyes as needed.     Omega-3 Fatty Acids (FISH OIL BURP-LESS) 1000 MG CAPS Takes 1000 mg BID 60 capsule 0   omeprazole (PRILOSEC) 40 MG capsule Take 1 capsule (40 mg total) by mouth daily. 90 capsule 0   Polyethyl Glycol-Propyl Glycol (SYSTANE OP) Apply 1 drop to eye as needed. Apply to both eyes as needed     rosuvastatin (CRESTOR) 5 MG tablet Take 1 tablet (5 mg total) by mouth 3 (three) times a week. 45 tablet 2   sertraline (ZOLOFT) 25 MG tablet Take 1 tablet (25 mg total) by mouth at bedtime. (Patient not taking: Reported on 12/25/2018) 30 tablet 5   traMADol (ULTRAM) 50 MG tablet Take 50 mg by mouth as needed.      vitamin B-12 (CYANOCOBALAMIN) 1000 MCG tablet Take 1,000 mcg by mouth daily.      vitamin E 400 UNIT capsule Take 400 Units by mouth daily.     No current facility-administered medications on file prior to visit.     Allergies  Allergen Reactions   Bactrim [Sulfamethoxazole-Trimethoprim] Other (See Comments)   Nitroglycerin Anaphylaxis    Hypotensive after NTG administration in ER during chest pain evaluation Pt states she almost died,  Pt states in a procedure done in 10/2016 she had no reaction to this medication, so not sure if it is an actual allergy.    Pentosan Polysulfate Sodium     Elevated LFTs.......Marland Kitchen elmiron    Promethazine Hcl Other (See Comments)    "jittery on the inside"   Sulfamethoxazole-Trimethoprim Nausea Only    Unknown reaction per pt, Pt thinks she may have felt sick to her stomach.    Lisinopril Cough    Assessment/Plan:  1. Hyperlipidemia -  Patient's LDL is currently at goal with an LDL of 64 in 05/2019 (LDL goal <100). Will continue patient on rosuvastatin 5 mg TIW for now and check a fasting lipid panel on 02/27/19. Spoke to patient about the possibility of adding Zetia to her existing therapy at that point if her LDL has increased to above goal. Instructed patient to call lipid clinic  with questions or continued adverse events on statin therapy. Patient communicated understanding.   Gwenlyn Found, [place].B[place] D PGY1 Pharmacy Resident  12/25/2018   1:21 PM

## 2018-12-25 NOTE — Progress Notes (Signed)
Virtual Visit via Video Note   This visit type was conducted due to national recommendations for restrictions regarding the COVID-19 Pandemic (e.g. social distancing) in an effort to limit this patient's exposure and mitigate transmission in our community.  Due to her co-morbid illnesses, this patient is at least at moderate risk for complications without adequate follow up.  This format is felt to be most appropriate for this patient at this time.  All issues noted in this document were discussed and addressed.  A limited physical exam was performed with this format.  Please refer to the patient's chart for her consent to telehealth for [facility].   Date:  12/25/2018   ID:  [name], DOB [birth year], MRN [id number]  Patient Location: Home Provider Location: Home  PCP:  Binnie Rail, MD  Cardiologist:  Ena Dawley, MD  Electrophysiologist:  None   Evaluation Performed:  Follow-Up Visit  Chief Complaint: Lower extremity edema  History of Present Illness:    [name] is a 73 y.o. female with h/o hypertension, hyperlipidemia who was previously seen in our office in 2014 for concerns of chest pain and underwent an exercise nuclear stress test that showed LVEF of 70% and no prior infarct or ischemia. She came 3 years later for occasional left-sided chest pain not related to exertion. She has been treated with Toprol for high blood pressure. She complains of fatigue and occasional dizziness. She also has occasional lower extremity edema. Denies any orthopnea or proximal nocturnal dyspnea. 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 She has never had an echocardiogram to evaluate for aortic regurgitation or ascending aortic aneurysm. She denies any palpitations or syncope. She has visited ER on 10/12/16 with dizziness and chest pain and was discharged home after ACS was ruled out.  07/03/2018- 6 months follow-up, the patient has  been experiencing back pain and was diagnosed with spinal stenosis for which she received steroid injection complicated by inability to walk for couple hours. This is now resolved. She feels slightly more dizzy, her blood pressure is elevated today, she has minimal lower extremity edema.She went to the ER in October with chest pain, ACS was ruled out and after Prilosec was restarted her symptoms have resolved.  11/24/2018 - 6 months follow up - the patient denies any chest pain or SOB> She has developed swelling of upper and lower extremities as well as eyelids and lips. No problems swallowing. No fever, chills. Refers to night leg cramping and also at rest, no caludications while walking.  Dec 25, 2018 -the patient was started on spironolactone but did not tolerated well and was switched back to lower dose of hydrochlorothiazide 12.5 mg daily, her lower extremity edema has improved significantly she has persistent facial/eyelid edema.  Her lower extremity cramping improved with discontinuation of rosuvastatin.  The patient does not have symptoms concerning for COVID-19 infection (fever, chills, cough, or new shortness of breath).    Past Medical History:  Diagnosis Date  . Allergy    seasonal  . Ankylosing spondylitis ([place])   . Anxiety   . Arthritis   . Bell palsy   . CTS (carpal tunnel syndrome)   . Cystitis   . Diverticulitis   . Diverticulosis of colon   . Dry eye syndrome   . Exocrine pancreatic insufficiency   . Frozen shoulder   . GERD (gastroesophageal reflux disease)   . HLA B27 (HLA B27 positive)   .  Hx of colonic polyps   . Hyperlipidemia    NMR 2005  . Hypertension   . Hypothyroidism   . Incontinence   . Internal hemorrhoids   . Interstitial cystitis   . Kidney stone   . Menopause   . Osteoarthritis   . Osteopenia    [facility]  . S/P total hysterectomy and bilateral salpingo-oophorectomy 04/12/2014  . Sinusitis   . Sleep apnea    c-pap   . Subjective visual disturbance of both eyes 12/30/2013  . Tubular adenoma of colon   . Vitamin D deficiency   . Xerostomia    and Xeroophthalmia   Past Surgical History:  Procedure Laterality Date  . BLADDER SURGERY     for incontinence  . CARPAL TUNNEL RELEASE Left 12/22/2014   Procedure: LEFT CARPAL TUNNEL RELEASE;  Surgeon: Daryll Brod, MD;  Location: [place];  Service: Orthopedics;  Laterality: Left;  . COLONOSCOPY W/ POLYPECTOMY     adenomatous poyp 04-2008  . colonscopy     tics 10-2002  . CYSTOSCOPY WITH RETROGRADE PYELOGRAM, URETEROSCOPY AND STENT PLACEMENT Left 09/01/2013   Procedure: CYSTOSCOPY WITH RETROGRADE PYELOGRAM, AND LEFT STENT PLACEMENT;  Surgeon: Molli Hazard, MD;  Location: [facility];  Service: Urology;  Laterality: Left;  . elevated LFTs     due to Elmiron  . FINGER SURGERY Left 2004   2nd finger  . G 1 P 1    . interstitial cystitis     with clot in catheter  . PARTIAL HYSTERECTOMY     with one ovary left  . SHOULDER ARTHROSCOPY Left   . TOTAL ABDOMINAL HYSTERECTOMY W/ BILATERAL SALPINGOOPHORECTOMY     dysfunctional menses     Current Meds  Medication Sig  . acetaminophen (TYLENOL) 500 MG tablet Take 1,000 mg by mouth 3 (three) times daily as needed for mild pain.   . Ascorbic Acid (VITAMIN C) 1000 MG tablet Take 1,000 mg by mouth daily.   Marland Kitchen aspirin 81 MG tablet Take 81 mg by mouth daily.    Marland Kitchen azelastine (ASTELIN) 0.1 % nasal spray One to two sprays each nostril twice a day as needed.  . blood glucose meter kit and supplies KIT Dispense based on patient and insurance preference. Use up to four times daily as directed. prediabetes  . cetirizine (ZYRTEC) 10 MG tablet Take 10 mg by mouth daily.  . Cholecalciferol (VITAMIN D-3) 25 MCG (1000 UT) CAPS Take 5,000 Units by mouth daily.  . ciclopirox (PENLAC) 8 % solution   . Diclofenac Sodium 2 % SOLN Place onto the skin as directed for pain as needed.  . diphenoxylate-atropine (LOMOTIL)  2.5-0.025 MG tablet Take 1 tablet by mouth 4 (four) times daily as needed for diarrhea or loose stools.  Marland Kitchen EPINEPHrine (EPIPEN 2-PAK) 0.3 mg/0.3 mL IJ SOAJ injection Use as directed for severe allergic reactions  . hydrochlorothiazide (HYDRODIURIL) 25 MG tablet Take 0.5 tablets (12.5 mg total) by mouth daily.  . lipase/protease/amylase (CREON) 36000 UNITS CPEP capsule Take 36,000 Units by mouth as needed.  . meloxicam (MOBIC) 15 MG tablet TAKE 1 TABLET (15 MG TOTAL) BY MOUTH DAILY AS NEEDED FOR PAIN. WITH FOOD  . Olopatadine HCl 0.7 % SOLN Place 1 drop into both eyes as needed.  . Omega-3 Fatty Acids (FISH OIL BURP-LESS) 1000 MG CAPS Takes 1000 mg BID  . omeprazole (PRILOSEC) 40 MG capsule Take 1 capsule (40 mg total) by mouth daily.  Vladimir Faster Glycol-Propyl  Glycol (SYSTANE OP) Apply 1 drop to eye as needed. Apply to both eyes as needed  . traMADol (ULTRAM) 50 MG tablet Take 50 mg by mouth as needed.   . vitamin B-12 (CYANOCOBALAMIN) 1000 MCG tablet Take 1,000 mcg by mouth daily.   . vitamin E 400 UNIT capsule Take 400 Units by mouth daily.     Allergies:   Bactrim [sulfamethoxazole-trimethoprim]; Nitroglycerin; Pentosan polysulfate sodium; Promethazine hcl; Sulfamethoxazole-trimethoprim; and Lisinopril   Social History   Tobacco Use  . Smoking status: Never Smoker  . Smokeless tobacco: Never Used  Substance Use Topics  . Alcohol use: Not Currently    Comment:      . Drug use: No     Family Hx: The patient's family history includes Allergic rhinitis in her sister and sister; Asthma in her mother; Breast cancer in her maternal aunt; Cirrhosis in her father; Colon cancer (age of onset: 56) in her sister; Coronary artery disease in her maternal grandfather and mother; Dementia in her mother; Diabetes in her father, paternal aunt, and paternal grandmother; Food Allergy in her sister; Hypertension in her mother; Renal cancer in her paternal aunt; Stroke in her mother. There is no history of  Esophageal cancer or Stomach cancer.  ROS:   Please see the history of present illness.    All other systems reviewed and are negative.   Prior CV studies:   The following studies were reviewed today:  TTE:  Left ventricle: The cavity size was normal. Systolic function was   normal. The estimated ejection fraction was in the range of 55%   to 60%. Wall motion was normal; there were no regional wall   motion abnormalities. Doppler parameters are consistent with   abnormal left ventricular relaxation (grade 1 diastolic   dysfunction). - Aortic valve: There was mild regurgitation. - Left atrium: The atrium was mildly dilated. - Atrial septum: No defect or patent foramen ovale was identified.  Labs/Other Tests and Data Reviewed:    EKG:  No ECG reviewed.  Recent Labs: 12/24/2018: ALT 34; BUN 24; Creatinine, Ser 1.17; Hemoglobin 12.6; Platelets 233.0; Potassium 3.6; Sodium 138; TSH 2.68   Recent Lipid Panel Lab Results  Component Value Date/Time   CHOL 150 05/03/2018 09:13 AM   CHOL 125 08/06/2017 09:24 AM   TRIG 95.0 05/03/2018 09:13 AM   TRIG 181 (H) 07/23/2006 11:01 AM   HDL 65.90 05/03/2018 09:13 AM   HDL 56 08/06/2017 09:24 AM   CHOLHDL 2 05/03/2018 09:13 AM   LDLCALC 65 05/03/2018 09:13 AM   LDLCALC 43 08/06/2017 09:24 AM   LDLDIRECT 114.0 05/02/2016 09:19 AM    Wt Readings from Last 3 Encounters:  12/25/18 156 lb (70.8 kg)  11/28/18 163 lb (73.9 kg)  11/27/18 163 lb (73.9 kg)     Objective:    Vital Signs:  BP 128/78   Pulse 78   Ht 5' 2.5" (1.588 m)   Wt 156 lb (70.8 kg)   BMI 28.08 kg/m    VITAL SIGNS:  reviewed  ASSESSMENT & PLAN:    1. Hypertension -controlled on hydrochlorothiazide 12.5 mg daily, will continue improvement of lower extremity edema 2. Hyperlipidemia -LDL and HDL at goal with rosuvastatin.  However significant cramping, she is advised to take rosuvastatin 5 mg 3 times a week, we will refer to the lipid clinic. 3. Chest pain -  negative stress test in the past, calcium score and coronary CTA showed coronary calcium score of 56. This was 59 percentile  for age and sex matched control. Moderate CAD in RCA/PDA, CT FFR was normal.Recent chest pain resolved with Prilosec. 4. HLA B gene positivity - associated ankylosing spondylitis, echo in 09/2016 showed no ascending aortic aneurysm but mild aortic regurgitation. We will follow every other year. 5. LE edema -resolved with hydrochlorothiazide 12.5 mg daily.  6. Leg cramping -improved with discontinuation of rosuvastatin, we will also obtain arterial lower extremity duplex.  COVID-19 Education: The signs and symptoms of COVID-19 were discussed with the patient and how to seek care for testing (follow up with PCP or arrange E-visit).  The importance of social distancing was discussed today.  Time:   Today, I have spent 25 minutes with the patient with telehealth technology discussing the above problems.     Medication Adjustments/Labs and Tests Ordered: Current medicines are reviewed at length with the patient today.  Concerns regarding medicines are outlined above.   Tests Ordered: No orders of the defined types were placed in this encounter.   Medication Changes: No orders of the defined types were placed in this encounter.   Disposition:  Follow up in 6 month(s)  Signed, Ena Dawley, MD  12/25/2018 10:47 AM    [place]

## 2018-12-25 NOTE — Patient Instructions (Signed)
Medication Instructions:    START TAKING ROSUVASTATIN (CRESTOR) 5 MG BY MOUTH THREE TIMES WEEKLY  If you need a refill on your cardiac medications before your next appointment, please call your pharmacy.     Testing/Procedures:  Your physician has requested that you have a lower extremity arterial duplex. This test is an ultrasound of the arteries in the legs . It looks at arterial blood flow in the legs. Allow one hour for Lower Arterial scans. There are no restrictions or special instructions. A SCHEDULER IN OUR OFFICE WILL BE IN CONTACT WITH  YOU IN THE NEAR FUTURE TO ARRANGE THIS APPOINTMENT.   *You have been referred to --OUR [facility] TO SEE OUR PHARMACIST FOR CONSIDERATION OF PCSK9-INHIBITORS.  OUR SCHEDULING DEPARTMENT OR OUR PHARMACIST WILL BE IN CONTACT WITH YOU IN THE NEAR FUTURE TO ARRANGE THIS APPOINTMENT-THIS WILL MORE THAN LIKELY BE A VIRTUAL VISIT THAT THEY WILL BE ARRANGED FOR YOU.    Follow-Up: At [facility], you and your health needs are our priority.  As part of our continuing mission to provide you with exceptional heart care, we have created designated Provider Care Teams.  These Care Teams include your primary Cardiologist (physician) and Advanced Practice Providers (APPs -  Physician Assistants and Nurse Practitioners) who all work together to provide you with the care you need, when you need it.  Your physician wants you to follow-up in: [place] will receive a reminder letter in the mail two months in advance. If you don't receive a letter, please call our office to schedule the follow-up appointment. PLEASE CALL OUR OFFICE AROUND THE BEGINNING OF September TO HAVE THIS APPOINTMENT ARRANGED WITH DR NELSON-THIS WILL BE A REGULAR OFFICE VISIT--HER SCHEDULE WILL BE OPEN AT THAT TIME TO ARRANGE YOUR 6 MONTH FOLLOW-UP APPOINTMENT.

## 2018-12-25 NOTE — Addendum Note (Signed)
Addended by: Nuala Alpha on: 12/25/2018 11:44 AM   Modules accepted: Orders

## 2018-12-26 ENCOUNTER — Encounter: Payer: Self-pay | Admitting: Internal Medicine

## 2018-12-26 LAB — ANTI-NUCLEAR AB-TITER (ANA TITER): ANA Titer 1: 1:40 {titer} — ABNORMAL HIGH

## 2018-12-26 LAB — CYCLIC CITRUL PEPTIDE ANTIBODY, IGG: Cyclic Citrullin Peptide Ab: <16 UNITS

## 2018-12-26 LAB — ANA: Anti Nuclear Antibody (ANA): POSITIVE — AB

## 2018-12-26 LAB — RHEUMATOID FACTOR: Rheumatoid fact SerPl-aCnc: 17 IU/mL — ABNORMAL HIGH (ref <14)

## 2018-12-26 NOTE — Progress Notes (Signed)
Agree with plan as documented. Will follow up after lipid panel on 02/27/19.

## 2018-12-27 ENCOUNTER — Telehealth: Payer: Self-pay | Admitting: *Deleted

## 2018-12-27 NOTE — Telephone Encounter (Signed)
-----   Message from Erskine Emery, [facility] sent at 12/27/2018  4:29 PM EDT ----- Regarding: RE: REFER TO LIPID CLINIC NEEDS APPT AND NEEDS LOWER EXTREMITY US SCHEDULED We spoke with her yesterday and scheduled her for follow up labs. We will follow up after her labs for additional changes.  ----- Message ----- From: Nuala Alpha, LPN Sent: 10/13/4006   3:02 PM EDT To: Erskine Emery, RPH, [facility] Subject: FW: REFER TO LIPID CLINIC NEEDS APPT AND NEE#  This pt was referred to lipid clinic at her virtual with Meda Coffee on 5/27. Any status of this being scheduled?  She is calling asking and Meda Coffee is as well.  Thanks,  [name]  ----- Message ----- From: Nuala Alpha, LPN Sent: 6/76/1950   5:24 PM EDT To: Windy [facility] Subject: FW: REFER TO LIPID CLINIC NEEDS APPT AND NEE#   ----- Message ----- From: Nuala Alpha, LPN Sent: 9/32/6712  11:40 AM EDT To: Erskine Emery, RPH, Megan Vivien Rota, RPH, # Subject: REFER TO LIPID CLINIC NEEDS APPT AND NEEDS L#  [name] AS A VIRTUAL THIS MORNING AND DR Laqueta Carina THE FOLLOWING APPTS ARRANGED:  1) SHE WANTS THE PT TO BE REFERRED AND SCHEDULED TO SEE LIPID CLINIC FOR CONSIDERATION OF PCSK9-INHIBITORS-REFERRAL IS IN AND SHE IS AWARE YOU WILL CALL TO ARRANGE.  2) DR Meda Coffee WANTS THIS PT TO HAVE A LOWER EXTREMITY ARTERIAL US DONE FOR CLAUDICATIONS-ORDER IS IN AND PT IS AWARE YOU WILL CALL TO SCHEDULE.  CAN YOU PLEASE ARRANGE THE FOLLOWING APPTS AND SHOOT ME THE DATES TO GIVE TO DR Meda Coffee?  THANKS, IVY

## 2018-12-30 DIAGNOSIS — M19012 Primary osteoarthritis, left shoulder: Secondary | ICD-10-CM | POA: Diagnosis not present

## 2018-12-30 DIAGNOSIS — M25512 Pain in left shoulder: Secondary | ICD-10-CM | POA: Diagnosis not present

## 2018-12-30 MED FILL — tiZANidine HCL 4 MG TABS: 4 | 60 days supply | Qty: 60 | Fill #0

## 2018-12-31 IMAGING — CT CT HEART MORP W/ CTA COR W/ SCORE W/ CA W/CM &/OR W/O CM
1 of 10 series · 1 of 20 positions shown, 2 images · IV contrast (Iodine)
Comparison: 10/12/2016 chest radiograph.

CLINICAL DATA: 70 -year-old female with atypical chest pain.

EXAM:
Cardiac/Coronary  CT
TECHNIQUE: The patient was scanned on a Philips 256 scanner.

[Series 300: locator · axial · 0.35mm/px · z∈[+726,+726]mm · 1 of 1 slices shown, 2 images]
[im 1/1  vessel]
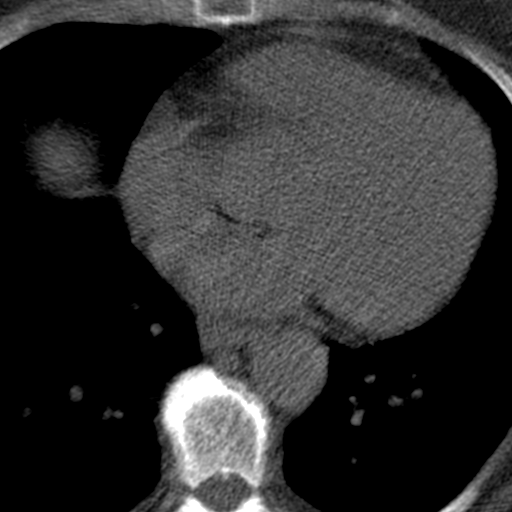
[im 1/1  lung]
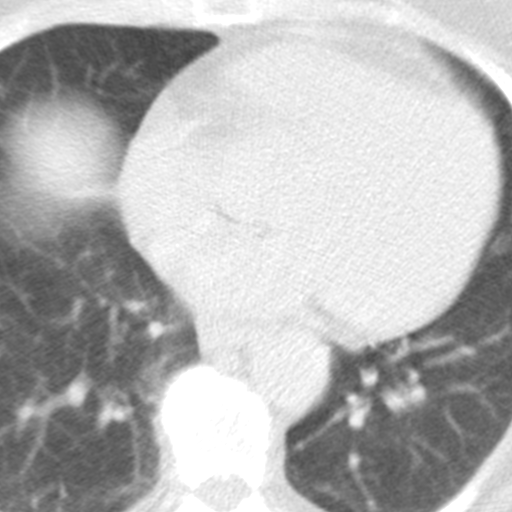

[1 of 20 positions shown; findings below may reference images not displayed]

FINDINGS: A 120 kV prospective scan was triggered in the descending thoracic
aorta at 111 HU's. Axial non-contrast 3 mm slices were carried out
through the heart. The data set was analyzed on a dedicated work
station and scored using the Agatson method. Gantry rotation speed
was 270 msecs and collimation was .9 mm. 10 mg of iv metoprolol and
0.8 mg of sl NTG was given. The 3D data set was reconstructed in 5%
intervals of the 67-82 % of the R-R cycle. Diastolic phases were
analyzed on a dedicated work station using MPR, MIP and VRT modes.
The patient received 80 cc of contrast.

Aorta:  Normal size.  No calcifications.  No dissection.

Aortic Valve:  Trileaflet.  No calcifications.

Coronary Arteries:  Normal coronary origin.  Right dominance.

RCA is a large dominant artery that gives rise to PDA and PLVB.
There is diffuse mixed plaque in the mid and distal RCA mostly with
25-50% stenosis. In the mid portion there is focal 50-69% stenosis
and another focal 50-69% stenosis in the distal RCA. PDA has small
lumen, that might represent a stenosis or a poor dilatation with sl
NTG.

Left main is a large long artery that gives rise to LAD, ramus
intermedius and LCX arteries. Left main has no plaque.

LAD is a large vessel that gives rise to one large diagonal branch.
Proximal LAD has a mild calcified plaque with associated stenosis
25-50%. After takeoff of the diagonal branch the LAD lumen decreases
significantly without any obvious plaque.

RI is a very small artery.

LCX is a non-dominant artery that gives rise to one OM1 branch.
There is no plaque.

Other findings:

Normal pulmonary vein drainage into the left atrium.

Normal let atrial appendage without a thrombus.
IMPRESSION: 1. Coronary calcium score of 56. This was 59 percentile for age and
sex matched control.

2. Normal coronary origin with right dominance.

3. At least moderate CAD in RCA/PDA and narrowing of the mid to
distal LAD. We will send the study for further evaluation with CT
FFR.

[place]

EXAM:
FF/RCT ANALYSIS
FINDINGS: FFRct analysis was performed on the original cardiac CT angiogram
dataset. Diagrammatic representation of the FFRct analysis is
provided in a separate PDF document in PACS. This dictation was
created using the PDF document and an interactive 3D model of the
results. 3D model is not available in the EMR/PACS. Normal FFR range
is >0.80.

1. Left Main:  No significant stenosis.

2. LAD: No significant stenosis.  FFR 0.97.
3. LCX: No significant stenosis.  FFR 0.94.
4. RCA: No significant stenosis.  Minimum FFR 0.92.  In PLVB 0.90.
IMPRESSION: 1.  CT FFR analysis didn't show any significant stenosis.

EXAM:
OVER-READ INTERPRETATION  CT CHEST

The following report is an over-read performed by radiologist Dr.
does not include interpretation of cardiac or coronary anatomy or
pathology. The coronary calcium score/coronary CTA interpretation by
the cardiologist is attached.
FINDINGS: Cardiovascular: Mildly atherosclerotic nonaneurysmal thoracic aorta.
Normal caliber pulmonary arteries. No central pulmonary emboli.

Mediastinum/Nodes: Unremarkable esophagus. No pathologically
enlarged axillary, mediastinal or hilar lymph nodes.

Lungs/Pleura: No pneumothorax. No pleural effusion. No acute
consolidative airspace disease or lung masses. There are two
left-sided pulmonary nodules, largest 5 mm in the subpleural
anterior left lower lobe (series 204/ image 19).

Upper abdomen: Diffuse hepatic steatosis.

Musculoskeletal: No aggressive appearing focal osseous lesions.
Marked thoracic spondylosis.
IMPRESSION: 1. Two left sided solid pulmonary nodules, largest 5 mm. No
follow-up needed if patient is low-risk (and has no known or
suspected primary neoplasm). Non-contrast chest CT can be considered
in 12 months if patient is high-risk. This recommendation follows
the consensus statement: Guidelines for Management of Incidental
Pulmonary Nodules Detected on CT Images: From the [HOSPITAL]
2. Aortic atherosclerosis.
3. Diffuse hepatic steatosis.

## 2019-01-03 ENCOUNTER — Other Ambulatory Visit: Payer: Self-pay | Admitting: Cardiology

## 2019-01-03 DIAGNOSIS — I739 Peripheral vascular disease, unspecified: Secondary | ICD-10-CM

## 2019-01-13 ENCOUNTER — Other Ambulatory Visit: Payer: Self-pay | Admitting: Cardiology

## 2019-01-13 ENCOUNTER — Other Ambulatory Visit: Payer: Self-pay

## 2019-01-13 ENCOUNTER — Ambulatory Visit (HOSPITAL_COMMUNITY)
Admission: RE | Admit: 2019-01-13 | Discharge: 2019-01-13 | Disposition: A | Discharge status: Home / Self Care | Payer: Medicare Other | Source: Ambulatory Visit | Attending: Internal Medicine | Admitting: Internal Medicine

## 2019-01-13 DIAGNOSIS — I739 Peripheral vascular disease, unspecified: Secondary | ICD-10-CM

## 2019-01-16 DIAGNOSIS — Z1231 Encounter for screening mammogram for malignant neoplasm of breast: Secondary | ICD-10-CM | POA: Diagnosis not present

## 2019-01-16 DIAGNOSIS — Z1239 Encounter for other screening for malignant neoplasm of breast: Secondary | ICD-10-CM | POA: Diagnosis not present

## 2019-01-20 DIAGNOSIS — G5601 Carpal tunnel syndrome, right upper limb: Secondary | ICD-10-CM | POA: Diagnosis not present

## 2019-01-21 ENCOUNTER — Telehealth: Payer: Self-pay | Admitting: *Deleted

## 2019-01-21 ENCOUNTER — Other Ambulatory Visit: Payer: Self-pay | Admitting: Orthopedic Surgery

## 2019-01-21 NOTE — Telephone Encounter (Signed)
   [facility] Pre-operative Risk Assessment    Request for surgical clearance:  1. What type of surgery is being performed? RIGHT CARPAL TUNNEL RELEASE  2. When is this surgery scheduled? 02/18/19  3. What type of clearance is required (medical clearance vs. Pharmacy clearance to hold med vs. Both)? MEDICAL  4. Are there any medications that need to be held prior to surgery and how long? ASA   5. Practice name and name of physician performing surgery? [facility]; DR. Fredna Dow  6. What is your office phone number [phone]   7.   What is your office fax number [phone]  8.   Anesthesia type (None, local, MAC, general) ? NOT LISTED. OFFICE ALREADY CLOSED   Julaine Hua 01/21/2019, 5:24 PM  _________________________________________________________________   (provider comments below)

## 2019-01-22 NOTE — Telephone Encounter (Signed)
   Primary Cardiologist: Ena Dawley, MD  Chart reviewed as part of pre-operative protocol coverage. Patient was contacted 01/22/2019 in reference to pre-operative risk assessment for pending surgery as outlined below.  [name] was last seen on 12/25/2018 (virtual visit) by Dr. Meda Coffee.  Since that day, [name] has done well without exertional chest pain or shortness of breath. 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  Therefore, based on ACC/AHA guidelines, the patient would be at acceptable risk for the planned procedure without further cardiovascular testing.   I will route this recommendation to the requesting party via Epic fax function and remove from pre-op pool.  Please call with questions. She may hold aspirin for 7 days prior to surgery and restart as soon as possible after the surgery.   [place], [place] 01/22/2019, 10:09 AM

## 2019-01-30 ENCOUNTER — Other Ambulatory Visit: Payer: Medicare Other

## 2019-02-10 ENCOUNTER — Other Ambulatory Visit: Payer: Self-pay

## 2019-02-10 ENCOUNTER — Encounter (HOSPITAL_BASED_OUTPATIENT_CLINIC_OR_DEPARTMENT_OTHER): Payer: Self-pay | Admitting: *Deleted

## 2019-02-14 ENCOUNTER — Other Ambulatory Visit: Payer: Self-pay

## 2019-02-14 ENCOUNTER — Encounter (HOSPITAL_BASED_OUTPATIENT_CLINIC_OR_DEPARTMENT_OTHER)
Admission: RE | Admit: 2019-02-14 | Discharge: 2019-02-14 | Disposition: A | Discharge status: Home / Self Care | Payer: Medicare Other | Source: Ambulatory Visit | Attending: Orthopedic Surgery | Admitting: Orthopedic Surgery

## 2019-02-14 ENCOUNTER — Other Ambulatory Visit (HOSPITAL_COMMUNITY)
Admission: RE | Admit: 2019-02-14 | Discharge: 2019-02-14 | Disposition: A | Discharge status: Home / Self Care | Payer: Medicare Other | Source: Ambulatory Visit | Attending: Orthopedic Surgery | Admitting: Orthopedic Surgery

## 2019-02-14 DIAGNOSIS — G5601 Carpal tunnel syndrome, right upper limb: Secondary | ICD-10-CM | POA: Diagnosis not present

## 2019-02-14 DIAGNOSIS — Z01812 Encounter for preprocedural laboratory examination: Secondary | ICD-10-CM | POA: Diagnosis not present

## 2019-02-14 DIAGNOSIS — Z1159 Encounter for screening for other viral diseases: Secondary | ICD-10-CM | POA: Diagnosis not present

## 2019-02-14 LAB — BASIC METABOLIC PANEL
Anion gap: 12 (ref 5–15)
BUN: 17 mg/dL (ref 8–23)
CO2: 25 mmol/L (ref 22–32)
Calcium: 9.7 mg/dL (ref 8.9–10.3)
Chloride: 103 mmol/L (ref 98–111)
Creatinine, Ser: 1.15 mg/dL — ABNORMAL HIGH (ref 0.44–1.00)
GFR calc Af Amer: 54 mL/min — ABNORMAL LOW (ref >60)
GFR calc non Af Amer: 47 mL/min — ABNORMAL LOW (ref >60)
Glucose, Bld: 104 mg/dL — ABNORMAL HIGH (ref 70–99)
Potassium: 4.1 mmol/L (ref 3.5–5.1)
Sodium: 140 mmol/L (ref 135–145)

## 2019-02-14 LAB — SARS CORONAVIRUS 2 (TAT 6-24 HRS): SARS Coronavirus 2: NEGATIVE

## 2019-02-14 NOTE — Progress Notes (Signed)
Gatorade 2 drink given with instructions to complete by [place], pt verbalized understanding.

## 2019-02-17 ENCOUNTER — Telehealth: Payer: Self-pay | Admitting: Internal Medicine

## 2019-02-17 DIAGNOSIS — K219 Gastro-esophageal reflux disease without esophagitis: Secondary | ICD-10-CM

## 2019-02-17 MED ORDER — OMEPRAZOLE 40 MG PO CPDR: 40.0000 mg | DELAYED_RELEASE_CAPSULE | Freq: Every day | ORAL | 0 refills | Status: DC

## 2019-02-17 MED FILL — OMEPRAZOLE 40 MG CPDR: 40 | 30 days supply | Qty: 30 | Fill #4

## 2019-02-17 NOTE — Telephone Encounter (Signed)
Pt requested a refill for prilosec sent to [facility] not to [facility].  Pt is having surgery and needs to take medication before then.

## 2019-02-17 NOTE — Telephone Encounter (Signed)
Rx sent. Patient advised. 

## 2019-02-18 ENCOUNTER — Encounter (HOSPITAL_BASED_OUTPATIENT_CLINIC_OR_DEPARTMENT_OTHER): Payer: Self-pay | Admitting: Certified Registered"

## 2019-02-18 ENCOUNTER — Encounter (HOSPITAL_BASED_OUTPATIENT_CLINIC_OR_DEPARTMENT_OTHER)
Admission: RE | Disposition: A | Discharge status: Home / Self Care | Payer: Self-pay | Source: Home / Self Care | Attending: Orthopedic Surgery

## 2019-02-18 ENCOUNTER — Ambulatory Visit (HOSPITAL_BASED_OUTPATIENT_CLINIC_OR_DEPARTMENT_OTHER): Payer: Medicare Other | Admitting: Certified Registered"

## 2019-02-18 ENCOUNTER — Other Ambulatory Visit: Payer: Self-pay

## 2019-02-18 ENCOUNTER — Ambulatory Visit (HOSPITAL_BASED_OUTPATIENT_CLINIC_OR_DEPARTMENT_OTHER)
Admission: RE | Admit: 2019-02-18 | Discharge: 2019-02-18 | Disposition: A | Discharge status: Home / Self Care | Payer: Medicare Other | Attending: Orthopedic Surgery | Admitting: Orthopedic Surgery

## 2019-02-18 DIAGNOSIS — G5601 Carpal tunnel syndrome, right upper limb: Secondary | ICD-10-CM | POA: Diagnosis not present

## 2019-02-18 DIAGNOSIS — G4730 Sleep apnea, unspecified: Secondary | ICD-10-CM | POA: Diagnosis not present

## 2019-02-18 DIAGNOSIS — K219 Gastro-esophageal reflux disease without esophagitis: Secondary | ICD-10-CM | POA: Insufficient documentation

## 2019-02-18 DIAGNOSIS — E119 Type 2 diabetes mellitus without complications: Secondary | ICD-10-CM | POA: Diagnosis not present

## 2019-02-18 DIAGNOSIS — I10 Essential (primary) hypertension: Secondary | ICD-10-CM | POA: Diagnosis not present

## 2019-02-18 DIAGNOSIS — E785 Hyperlipidemia, unspecified: Secondary | ICD-10-CM | POA: Diagnosis not present

## 2019-02-18 DIAGNOSIS — R200 Anesthesia of skin: Secondary | ICD-10-CM | POA: Diagnosis present

## 2019-02-18 HISTORY — PX: CARPAL TUNNEL RELEASE: SHX101

## 2019-02-18 HISTORY — DX: Prediabetes: R73.03

## 2019-02-18 SURGERY — CARPAL TUNNEL RELEASE
Anesthesia: Regional | Site: Hand | Laterality: Right

## 2019-02-18 MED ORDER — LACTATED RINGERS IV SOLN
INTRAVENOUS | Status: DC
  Administered 2019-02-18: 10:00:00 via INTRAVENOUS

## 2019-02-18 MED ORDER — LIDOCAINE HCL (PF) 0.5 % IJ SOLN: INTRAMUSCULAR | Status: DC | PRN

## 2019-02-18 MED ORDER — BUPIVACAINE HCL (PF) 0.25 % IJ SOLN
INTRAMUSCULAR | Status: DC | PRN
  Administered 2019-02-18: 9 mL

## 2019-02-18 MED ORDER — SCOPOLAMINE 1 MG/3DAYS TD PT72: 1.0000 | MEDICATED_PATCH | Freq: Once | TRANSDERMAL | Status: DC

## 2019-02-18 MED ORDER — CHLORHEXIDINE GLUCONATE 4 % EX LIQD: 60.0000 mL | Freq: Once | CUTANEOUS | Status: DC

## 2019-02-18 MED ORDER — FENTANYL CITRATE (PF) 100 MCG/2ML IJ SOLN
50.0000 ug | INTRAMUSCULAR | Status: DC | PRN
  Administered 2019-02-18: 50 ug via INTRAVENOUS

## 2019-02-18 MED ORDER — ACETAMINOPHEN 500 MG PO TABS
ORAL_TABLET | ORAL | Status: AC
  Filled 2019-02-18: qty 2

## 2019-02-18 MED ORDER — 0.9 % SODIUM CHLORIDE (POUR BTL) OPTIME
TOPICAL | Status: DC | PRN
  Administered 2019-02-18: 1000 mL

## 2019-02-18 MED ORDER — MIDAZOLAM HCL 2 MG/2ML IJ SOLN: 1.0000 mg | INTRAMUSCULAR | Status: DC | PRN

## 2019-02-18 MED ORDER — ACETAMINOPHEN 500 MG PO TABS: 1000.0000 mg | ORAL_TABLET | Freq: Once | ORAL | Status: DC

## 2019-02-18 MED ORDER — LIDOCAINE HCL (PF) 0.5 % IJ SOLN
INTRAMUSCULAR | Status: DC | PRN
  Administered 2019-02-18: 30 mL via INTRAVENOUS

## 2019-02-18 MED ORDER — PROPOFOL 500 MG/50ML IV EMUL
INTRAVENOUS | Status: DC | PRN
  Administered 2019-02-18: 75 ug/kg/min via INTRAVENOUS

## 2019-02-18 MED ORDER — HYDROCODONE-ACETAMINOPHEN 5-325 MG PO TABS: 1.0000 | ORAL_TABLET | Freq: Four times a day (QID) | ORAL | 0 refills | Status: DC | PRN

## 2019-02-18 MED ORDER — ONDANSETRON HCL 4 MG/2ML IJ SOLN
INTRAMUSCULAR | Status: DC | PRN
  Administered 2019-02-18: 4 mg via INTRAVENOUS

## 2019-02-18 MED ORDER — CEFAZOLIN SODIUM-DEXTROSE 2-4 GM/100ML-% IV SOLN
2.0000 g | INTRAVENOUS | Status: AC
  Administered 2019-02-18: 10:00:00 2 g via INTRAVENOUS

## 2019-02-18 MED ORDER — LIDOCAINE HCL (CARDIAC) PF 100 MG/5ML IV SOSY
PREFILLED_SYRINGE | INTRAVENOUS | Status: DC | PRN
  Administered 2019-02-18: 30 mg via INTRAVENOUS

## 2019-02-18 MED ORDER — ONDANSETRON HCL 4 MG/2ML IJ SOLN: 4.0000 mg | Freq: Once | INTRAMUSCULAR | Status: DC | PRN

## 2019-02-18 MED ORDER — FENTANYL CITRATE (PF) 100 MCG/2ML IJ SOLN: 25.0000 ug | INTRAMUSCULAR | Status: DC | PRN

## 2019-02-18 MED ORDER — FENTANYL CITRATE (PF) 100 MCG/2ML IJ SOLN
INTRAMUSCULAR | Status: AC
  Filled 2019-02-18: qty 2

## 2019-02-18 MED FILL — HYDROCODON-APAP 5-325: 5-325 | 7 days supply | Qty: 30 | Fill #0

## 2019-02-18 SURGICAL SUPPLY — 35 items
BLADE SURG 15 STRL LF DISP TIS (BLADE) ×1 IMPLANT
BLADE SURG 15 STRL SS (BLADE) ×1
BNDG COHESIVE 3X5 TAN STRL LF (GAUZE/BANDAGES/DRESSINGS) ×2 IMPLANT
BNDG ESMARK 4X9 LF (GAUZE/BANDAGES/DRESSINGS) ×1 IMPLANT
BNDG GAUZE ELAST 4 BULKY (GAUZE/BANDAGES/DRESSINGS) ×2 IMPLANT
CHLORAPREP W/TINT 26 (MISCELLANEOUS) ×2 IMPLANT
CORD BIPOLAR FORCEPS 12FT (ELECTRODE) ×2 IMPLANT
COVER BACK TABLE REUSABLE LG (DRAPES) ×2 IMPLANT
COVER MAYO STAND REUSABLE (DRAPES) ×2 IMPLANT
COVER WAND RF STERILE (DRAPES) IMPLANT
CUFF TOURN SGL QUICK 18X4 (TOURNIQUET CUFF) ×2 IMPLANT
DRAPE EXTREMITY T 121X128X90 (DISPOSABLE) ×2 IMPLANT
DRAPE SURG 17X23 STRL (DRAPES) ×2 IMPLANT
DRSG PAD ABDOMINAL 8X10 ST (GAUZE/BANDAGES/DRESSINGS) ×2 IMPLANT
GAUZE SPONGE 4X4 12PLY STRL (GAUZE/BANDAGES/DRESSINGS) ×2 IMPLANT
GAUZE XEROFORM 1X8 LF (GAUZE/BANDAGES/DRESSINGS) ×2 IMPLANT
GLOVE BIOGEL PI IND STRL 7.0 (GLOVE) IMPLANT
GLOVE BIOGEL PI IND STRL 8.5 (GLOVE) ×1 IMPLANT
GLOVE BIOGEL PI INDICATOR 7.0 (GLOVE) ×1
GLOVE BIOGEL PI INDICATOR 8.5 (GLOVE) ×1
GLOVE SURG ORTHO 8.0 STRL STRW (GLOVE) ×2 IMPLANT
GOWN STRL REUS W/ TWL LRG LVL3 (GOWN DISPOSABLE) ×1 IMPLANT
GOWN STRL REUS W/TWL LRG LVL3 (GOWN DISPOSABLE) ×1
GOWN STRL REUS W/TWL XL LVL3 (GOWN DISPOSABLE) ×2 IMPLANT
NDL PRECISIONGLIDE 27X1.5 (NEEDLE) IMPLANT
NEEDLE PRECISIONGLIDE 27X1.5 (NEEDLE) IMPLANT
NS IRRIG 1000ML POUR BTL (IV SOLUTION) ×2 IMPLANT
PACK BASIN DAY SURGERY FS (CUSTOM PROCEDURE TRAY) ×2 IMPLANT
STOCKINETTE 4X48 STRL (DRAPES) ×2 IMPLANT
SUT ETHILON 4 0 PS 2 18 (SUTURE) ×2 IMPLANT
SUT VICRYL 4-0 PS2 18IN ABS (SUTURE) IMPLANT
SYR BULB 3OZ (MISCELLANEOUS) ×2 IMPLANT
SYR CONTROL 10ML LL (SYRINGE) IMPLANT
TOWEL GREEN STERILE FF (TOWEL DISPOSABLE) ×2 IMPLANT
UNDERPAD 30X30 (UNDERPADS AND DIAPERS) ×2 IMPLANT

## 2019-02-18 NOTE — Brief Op Note (Signed)
02/18/2019  10:54 AM  PATIENT:  [name]  73 y.o. female  PRE-OPERATIVE DIAGNOSIS:  RIGHT CARPAL TUNNEL SYNDROME  POST-OPERATIVE DIAGNOSIS:  RIGHT CARPAL TUNNEL SYNDROME  PROCEDURE:  Procedure(s): RIGHT CARPAL TUNNEL RELEASE (Right)  SURGEON:  Surgeon(s) and Role:    * Daryll Brod, MD - Primary  PHYSICIAN ASSISTANT:   ASSISTANTS: none   ANESTHESIA:   local, regional and IV sedation  EBL:  51ml BLOOD ADMINISTERED:none  DRAINS: none   LOCAL MEDICATIONS USED:  BUPIVICAINE   SPECIMEN:  No Specimen  DISPOSITION OF SPECIMEN:  N/A  COUNTS:  YES  TOURNIQUET:   Total Tourniquet Time Documented: Forearm (Right) - 22 minutes Total: Forearm (Right) - 22 minutes   DICTATION: .Dragon Dictation  PLAN OF CARE: Discharge to home after PACU  PATIENT DISPOSITION:  PACU - hemodynamically stable.

## 2019-02-18 NOTE — H&P (Signed)
[name] is an 73 y.o. female.   Chief Complaint:numbness right hand HPI: [name] is a 73 yo female complaining of her carpal tunnel syndrome  on her right side and has constant numbness and tingling to the median nerve distribution she states that is now affecting the tips of the fingers back to the distal phalangeal joints and thumb index and middle fingers. She has been wearing a splint. She states nothing makes it better or worse. She has markedly positive nerve conductions 2016 with a motor delay of 9.6 on the right at that time. He has had a carpal tunnel decompression on her left side which is done well. She is not being awakened at night as long as she wears her splint. She is scheduled for a probable reverse total shoulder by Dr. Ellin Goodie on her left side. She has no history of diabetes thyroid problems. She does have a history of gout. She does have a history of arthritis. Family history is positive for high blood pressure diabetes and arthritis   Past Medical History:  Diagnosis Date  . Allergy    seasonal  . Ankylosing spondylitis ([place])   . Anxiety   . Arthritis   . Bell palsy   . CTS (carpal tunnel syndrome)   . Cystitis   . Diverticulitis   . Diverticulosis of colon   . Dry eye syndrome   . Exocrine pancreatic insufficiency   . Frozen shoulder   . GERD (gastroesophageal reflux disease)   . HLA B27 (HLA B27 positive)   . Hx of colonic polyps   . Hyperlipidemia    NMR 2005  . Hypertension   . Hypothyroidism   . Incontinence   . Internal hemorrhoids   . Interstitial cystitis   . Kidney stone   . Menopause   . Osteoarthritis   . Osteopenia    [facility] [place]   . S/P total hysterectomy and bilateral salpingo-oophorectomy 04/12/2014  . Sinusitis   . Sleep apnea    c-pap  . Subjective visual disturbance of both eyes 12/30/2013  . Tubular adenoma of colon   . Vitamin D deficiency   . Xerostomia    and Xeroophthalmia    Past  Surgical History:  Procedure Laterality Date  . BLADDER SURGERY     for incontinence  . CARPAL TUNNEL RELEASE Left 12/22/2014   Procedure: LEFT CARPAL TUNNEL RELEASE;  Surgeon: Daryll Brod, MD;  Location: [facility];  Service: Orthopedics;  Laterality: Left;  . COLONOSCOPY W/ POLYPECTOMY     adenomatous poyp 04-2008  . colonscopy     tics 10-2002  . CYSTOSCOPY WITH RETROGRADE PYELOGRAM, URETEROSCOPY AND STENT PLACEMENT Left 09/01/2013   Procedure: CYSTOSCOPY WITH RETROGRADE PYELOGRAM, AND LEFT STENT PLACEMENT;  Surgeon: Molli Hazard, MD;  Location: [facility];  Service: Urology;  Laterality: Left;  . elevated LFTs     due to Elmiron  . FINGER SURGERY Left 2004   2nd finger  . G 1 P 1    . interstitial cystitis     with clot in catheter  . PARTIAL HYSTERECTOMY     with one ovary left  . SHOULDER ARTHROSCOPY Left   . TOTAL ABDOMINAL HYSTERECTOMY W/ BILATERAL SALPINGOOPHORECTOMY     dysfunctional menses    Family History  Problem Relation Age of Onset  . Coronary artery disease Mother   . Hypertension Mother   . Asthma Mother   . Dementia Mother   .  Stroke Mother        mini cva  . Diabetes Father   . Cirrhosis Father        non alcoholic  . Colon cancer Sister 14  . Allergic rhinitis Sister   . Allergic rhinitis Sister   . Food Allergy Sister   . Renal cancer Paternal Aunt        x 2  . Coronary artery disease Maternal Grandfather   . Breast cancer Maternal Aunt   . Diabetes Paternal Aunt   . Diabetes Paternal Grandmother   . Esophageal cancer Neg Hx   . Stomach cancer Neg Hx    Social History:  reports that she has never smoked. She has never used smokeless tobacco. She reports current alcohol use. She reports that she does not use drugs.  Allergies:  Allergies  Allergen Reactions  . Bactrim [Sulfamethoxazole-Trimethoprim] Other (See Comments)  . Nitroglycerin Anaphylaxis    Hypotensive after NTG administration in ER during chest pain  evaluation Pt states she almost died,  Pt states in a procedure done in 10/2016 she had no reaction to this medication, so not sure if it is an actual allergy.   . Pentosan Polysulfate Sodium     Elevated LFTs.......Marland Kitchen elmiron   . Promethazine Hcl Other (See Comments)    "jittery on the inside"  . Sulfamethoxazole-Trimethoprim Nausea Only    Unknown reaction per pt, Pt thinks she may have felt sick to her stomach.   . Lisinopril Cough    No medications prior to admission.    No results found for this or any previous visit (from the past 48 hour(s)).  No results found.   Pertinent items are noted in HPI.  Height 5' 2.5" (1.588 m), weight 70.4 kg.  General appearance: alert, cooperative and appears stated age Head: Normocephalic, without obvious abnormality Neck: no JVD Resp: clear to auscultation bilaterally Cardio: regular rate and rhythm, S1, S2 normal, no murmur, click, rub or gallop GI: soft, non-tender; bowel sounds normal; no masses,  no organomegaly Extremities: numbness right hand Pulses: 2+ and symmetric Skin: Skin color, texture, turgor normal. No rashes or lesions Neurologic: Grossly normal Incision/Wound: na  Assessment/Plan Diagnosis is right carpal tunnel syndrome.     Plan: She would like to proceed to have her right carpal tunnel release. Pre-peri-and postoperative course are discussed along with risks and complications. She is aware that there is no guarantee to the surgery the possibility of infection recurrence injury to arteries nerves tendons complete relief symptoms and dystrophy. She is scheduled for right carpal tunnel release in outpatient under regional anesthesia.   Daryll Brod 02/18/2019, 8:25 AM

## 2019-02-18 NOTE — Anesthesia Preprocedure Evaluation (Addendum)
Anesthesia Evaluation  Patient identified by MRN, date of birth, ID band Patient awake    Reviewed: Allergy & Precautions, NPO status , Patient's Chart, lab work & pertinent test results  Airway Mallampati: II  TM Distance: >3 FB Neck ROM: Full    Dental no notable dental hx.    Pulmonary sleep apnea and Continuous Positive Airway Pressure Ventilation ,    Pulmonary exam normal breath sounds clear to auscultation       Cardiovascular hypertension, Pt. on medications Normal cardiovascular exam+ Valvular Problems/Murmurs AI  Rhythm:Regular Rate:Normal  ECG: SR, rate 77. LAFB  ECHO: (2019) Left ventricle: The cavity size was normal. Systolic function was normal. The estimated ejection fraction was in the range of 55% to 60%. Wall motion was normal; there were no regional wall motion abnormalities. Doppler parameters are consistent with abnormal left ventricular relaxation (grade 1 diastolic dysfunction).  Pre-op eval per cardiology   Neuro/Psych PSYCHIATRIC DISORDERS Anxiety negative neurological ROS     GI/Hepatic Neg liver ROS, GERD  Medicated and Controlled,  Endo/Other  Hypothyroidism   Renal/GU negative Renal ROS     Musculoskeletal negative musculoskeletal ROS (+)   Abdominal   Peds  Hematology HLD   Anesthesia Other Findings RIGHT CARPAL TUNNEL SYNDROME  Reproductive/Obstetrics                            Anesthesia Physical Anesthesia Plan  ASA: III  Anesthesia Plan: Bier Block and Bier Block-LIDOCAINE ONLY   Post-op Pain Management:    Induction: Intravenous  PONV Risk Score and Plan: 2 and Propofol infusion, Treatment may vary due to age or medical condition, Ondansetron and Dexamethasone  Airway Management Planned: Simple Face Mask  Additional Equipment:   Intra-op Plan:   Post-operative Plan:   Informed Consent: I have reviewed the patients History and Physical,  chart, labs and discussed the procedure including the risks, benefits and alternatives for the proposed anesthesia with the patient or authorized representative who has indicated his/her understanding and acceptance.     Dental advisory given  Plan Discussed with: CRNA  Anesthesia Plan Comments:        Anesthesia Quick Evaluation

## 2019-02-18 NOTE — Anesthesia Postprocedure Evaluation (Signed)
Anesthesia Post Note  Patient: [name]  Procedure(s) Performed: RIGHT CARPAL TUNNEL RELEASE (Right Hand)     Patient location during evaluation: PACU Anesthesia Type: Bier Block Level of consciousness: awake and alert Pain management: pain level controlled Vital Signs Assessment: post-procedure vital signs reviewed and stable Respiratory status: spontaneous breathing, nonlabored ventilation, respiratory function stable and patient connected to nasal cannula oxygen Cardiovascular status: stable and blood pressure returned to baseline Postop Assessment: no apparent nausea or vomiting Anesthetic complications: no    Last Vitals:  Vitals:   02/18/19 1145 02/18/19 1215  BP: (!) 147/68 (!) 152/78  Pulse: (!) 47 (!) 59  Resp: 12 16  Temp:  36.6 C  SpO2: 94% 98%    Last Pain:  Vitals:   02/18/19 1215  TempSrc:   PainSc: 0-No pain                 Shonya Sumida P Zahriyah Joo

## 2019-02-18 NOTE — Anesthesia Procedure Notes (Signed)
Anesthesia Regional Block: Bier block (IV Regional)   Pre-Anesthetic Checklist: ,, timeout performed, Correct Patient, Correct Site, Correct Laterality, Correct Procedure,, site marked, surgical consent,, at surgeon's request  Laterality: Right     Needles:  Injection technique: Single-shot  Needle Type: Other      Needle Gauge: 20     Additional Needles:   Procedures:,,,,, intact distal pulses, Esmarch exsanguination, single tourniquet utilized,  Narrative:   Performed by: Personally       

## 2019-02-18 NOTE — Anesthesia Procedure Notes (Signed)
Procedure Name: MAC Date/Time: 02/18/2019 10:34 AM Performed by: Signe Colt, CRNA Pre-anesthesia Checklist: Patient identified, Emergency Drugs available, Suction available, Patient being monitored and Timeout performed Patient Re-evaluated:Patient Re-evaluated prior to induction Oxygen Delivery Method: Simple face mask

## 2019-02-18 NOTE — Discharge Instructions (Addendum)

## 2019-02-18 NOTE — Transfer of Care (Signed)
Immediate Anesthesia Transfer of Care Note  Patient: [name]  Procedure(s) Performed: RIGHT CARPAL TUNNEL RELEASE (Right Hand)  Patient Location: PACU  Anesthesia Type:Bier block  Level of Consciousness: awake, alert , oriented and patient cooperative  Airway & Oxygen Therapy: Patient Spontanous Breathing and Patient connected to nasal cannula oxygen  Post-op Assessment: Report given to RN and Post -op Vital signs reviewed and stable  Post vital signs: Reviewed and stable  Last Vitals:  Vitals Value Taken Time  BP    Temp    Pulse    Resp    SpO2      Last Pain:  Vitals:   02/18/19 0949  TempSrc: Oral  PainSc: 0-No pain         Complications: No apparent anesthesia complications

## 2019-02-18 NOTE — Op Note (Signed)
NAME: ANAROSA KUBISIAK MEDICAL RECORD NO: [id number] DATE OF BIRTH: 10-Apr-1946 FAC[facility] LOCATION: [facility] PHYSICIAN: Wynonia Sours, MD   OPERATIVE REPORT   DATE OF PROCEDURE: 02/18/19    PREOPERATIVE DIAGNOSIS:   Carpal tunnel syndrome right hand   POSTOPERATIVE DIAGNOSIS:   Same   PROCEDURE:   Decompression median nerve right hand   SURGEON: Daryll Brod, M.D.   ASSISTANT: none   ANESTHESIA:  Bier block with sedation and Local   INTRAVENOUS FLUIDS:  Per anesthesia flow sheet.   ESTIMATED BLOOD LOSS:  Minimal.   COMPLICATIONS:  None.   SPECIMENS:  none   TOURNIQUET TIME:    Total Tourniquet Time Documented: Forearm (Right) - 22 minutes Total: Forearm (Right) - 22 minutes    DISPOSITION:  Stable to PACU.   INDICATIONS: Patient is a 73 year old female with history of carpal tunnel syndrome bilaterally.  She has undergone release on her left side is admitted now for release on the right.  Nerve conductions have been positive in the past.  She is aware of potential complications including infection recurrence injury to arteries nerves tendons complete relief of symptoms and dystrophy.  In the preoperative area the patient is seen the extremity marked by both patient and surgeon antibiotic given  OPERATIVE COURSE: Patient is brought to the operating room where form based IV regional anesthetic was carried out without difficulty under the direction the anesthesia department.  She was prepped using ChloraPrep in the supine position with the right arm free.  A three-minute dry time was allowed and a timeout taken to confirm patient procedure.  A longitudinal incision was made in the right palm carried down through subcutaneous tissue.  Bleeders were electrocauterized with bipolar part.  The palmar fascia was split.  The superficial palmar arch was identified along with the flexor tendon to the ring little finger.  Retractors were placed retracting the median  nerve flexor tendons radially ulnar nerve ulnarly.  The flexor retinaculum was then released on its ulnar border.  Right angle and stool retractor placed between skin and forearm fascia.  Deep structures were dissected free.  Right angle retractor was placed and the proximal aspect of the flexor retinaculum distal forearm fascia was then released for approximately 2 to 3 cm proximal to the wrist crease under direct vision.  The canal was explored.  An area compression to the nerve was apparent.  Motor branch entered in the muscle distally.  The wound was copiously irrigated with saline.  The skin was closed interrupted 4-0 nylon sutures.  Local infiltration quarter percent bupivacaine without epinephrine was given approximately 8 cc was used.  A sterile compressive dressing with the fingers 3 was applied.  Deflation of the tourniquet all fingers immediately pink.  She was taken to the recovery room for observation in satisfactory condition.  She will be discharged home to return the hand center [facility] in 1 week on Tylenol ibuprofen for pain with Norco as a backup for breakthrough.   Daryll Brod, MD Electronically signed, 02/18/19

## 2019-02-19 ENCOUNTER — Encounter (HOSPITAL_BASED_OUTPATIENT_CLINIC_OR_DEPARTMENT_OTHER): Payer: Self-pay | Admitting: Orthopedic Surgery

## 2019-02-26 ENCOUNTER — Other Ambulatory Visit: Payer: Self-pay | Admitting: *Deleted

## 2019-02-26 DIAGNOSIS — I2510 Atherosclerotic heart disease of native coronary artery without angina pectoris: Secondary | ICD-10-CM

## 2019-02-26 DIAGNOSIS — E785 Hyperlipidemia, unspecified: Secondary | ICD-10-CM

## 2019-02-26 NOTE — Progress Notes (Signed)
1. Hyperlipidemia - Patient's LDL is currently at goal with an LDL of 64 in 05/2019 (LDL goal <100). Will continue patient on rosuvastatin 5 mg TIW for now and check a fasting lipid panel on 02/27/19. Spoke to patient about the possibility of adding Zetia to her existing therapy at that point if her LDL has increased to above goal. Instructed patient to call lipid clinic with questions or continued adverse events on statin therapy. Patient communicated understanding.   Gwenlyn Found, [place].B[place] D PGY1 Pharmacy Resident  12/25/2018   1:21 PM   Pt is coming in for lab appt to check lipids and LFTs per Pharmacist in lipid clinic, as mentioned above.  No labs were entered so placed those in the system, per our Lab Tech request.

## 2019-02-27 ENCOUNTER — Other Ambulatory Visit: Payer: Medicare Other

## 2019-02-27 ENCOUNTER — Other Ambulatory Visit: Payer: Self-pay

## 2019-02-27 DIAGNOSIS — I2510 Atherosclerotic heart disease of native coronary artery without angina pectoris: Secondary | ICD-10-CM

## 2019-02-27 DIAGNOSIS — E785 Hyperlipidemia, unspecified: Secondary | ICD-10-CM

## 2019-02-27 LAB — HEPATIC FUNCTION PANEL
ALT: 19 IU/L (ref 0–32)
AST: 22 IU/L (ref 0–40)
Albumin: 4.7 g/dL (ref 3.7–4.7)
Alkaline Phosphatase: 98 IU/L (ref 39–117)
Bilirubin Total: 0.3 mg/dL (ref 0.0–1.2)
Bilirubin, Direct: 0.1 mg/dL (ref 0.00–0.40)
Total Protein: 7 g/dL (ref 6.0–8.5)

## 2019-02-27 LAB — LIPID PANEL
Chol/HDL Ratio: 2.4 ratio (ref 0.0–4.4)
Cholesterol, Total: 139 mg/dL (ref 100–199)
HDL: 59 mg/dL (ref >39)
LDL Calculated: 47 mg/dL (ref 0–99)
Triglycerides: 167 mg/dL — ABNORMAL HIGH (ref 0–149)
VLDL Cholesterol Cal: 33 mg/dL (ref 5–40)

## 2019-03-05 ENCOUNTER — Ambulatory Visit: Payer: Medicare Other

## 2019-03-05 ENCOUNTER — Telehealth: Payer: Self-pay

## 2019-03-05 ENCOUNTER — Telehealth: Payer: Self-pay | Admitting: Pharmacist

## 2019-03-05 DIAGNOSIS — M79641 Pain in right hand: Secondary | ICD-10-CM | POA: Diagnosis not present

## 2019-03-05 DIAGNOSIS — G5601 Carpal tunnel syndrome, right upper limb: Secondary | ICD-10-CM | POA: Diagnosis not present

## 2019-03-05 DIAGNOSIS — E782 Mixed hyperlipidemia: Secondary | ICD-10-CM

## 2019-03-05 NOTE — Telephone Encounter (Signed)
Notes recorded by Frederik Schmidt, RN on 03/05/2019 at 12:27 PM EDT  The patient has been notified of the result and verbalized understanding. All questions (if any) were answered.  Frederik Schmidt, RN 03/05/2019 12:26 PM

## 2019-03-05 NOTE — Progress Notes (Deleted)
Patient ID: [name]                 DOB: Feb 15, 1946                    MRN: [id number]     HPI: [name] is a 73 y.o. female patient referred to lipid clinic by Dr. Meda Coffee. PMH is significant for hypertension and hyperlipidemia.   Patient is currently being treated for hyperlipdemia with a family history of CAD and a calculated ASCVD risk of 18.7%. Patient has been taking rosuvastatin 5 mg three times a week since last visit on 12/25/2018.   Current Medications: Rosuvastatin 5 mg three times weekly  Intolerances: Rosuvastatin 5 mg daily (lower extremity cramps)  Risk Factors: F/H of CAD, Calculated ASCVD risk 18.7%  LDL goal: <100  Family History: The patient's family history includes Allergic rhinitis in her sister and sister; Asthma in her mother; Breast cancer in her maternal aunt; Cirrhosis in her father; Colon cancer (age of onset: 27) in her sister; Coronary artery disease in her maternal grandfather and mother; Dementia in her mother; Diabetes in her father, paternal aunt, and paternal grandmother; Food Allergy in her sister; Hypertension in her mother; Renal cancer in her paternal aunt; Stroke in her mother.   Social History: Denies smoking, alcohol use and drug use.   Labs: Lipid panel (05/03/18): TC 150, TG 95, HDL 65.9, LDL 65, non-HDL 83.97 (rosuvastatin 5 mg daily)   Past Medical History:  Diagnosis Date  . Allergy    seasonal  . Ankylosing spondylitis ([place])   . Anxiety   . Arthritis   . Bell palsy   . CTS (carpal tunnel syndrome)   . Cystitis   . Diverticulitis   . Diverticulosis of colon   . Dry eye syndrome   . Exocrine pancreatic insufficiency   . Frozen shoulder   . GERD (gastroesophageal reflux disease)   . HLA B27 (HLA B27 positive)   . Hx of colonic polyps   . Hyperlipidemia    NMR 2005  . Hypertension   . Hypothyroidism   . Incontinence   . Internal hemorrhoids   . Interstitial cystitis   . Kidney stone   . Menopause   . Osteoarthritis    . Osteopenia    [facility] [place]   . S/P total hysterectomy and bilateral salpingo-oophorectomy 04/12/2014  . Sinusitis   . Sleep apnea    c-pap  . Subjective visual disturbance of both eyes 12/30/2013  . Tubular adenoma of colon   . Vitamin D deficiency   . Xerostomia    and Xeroophthalmia    Current Outpatient Medications on File Prior to Visit  Medication Sig Dispense Refill  . acetaminophen (TYLENOL) 500 MG tablet Take 1,000 mg by mouth 3 (three) times daily as needed for mild pain.     . Ascorbic Acid (VITAMIN C) 1000 MG tablet Take 1,000 mg by mouth daily.     Marland Kitchen aspirin 81 MG tablet Take 81 mg by mouth daily.      Marland Kitchen azelastine (ASTELIN) 0.1 % nasal spray One to two sprays each nostril twice a day as needed. 30 mL 5  . blood glucose meter kit and supplies KIT Dispense based on patient and insurance preference. Use up to four times daily as directed. prediabetes 1 each 0  . cetirizine (ZYRTEC) 10 MG tablet Take 10 mg by mouth daily.    Marland Kitchen  Cholecalciferol (VITAMIN D-3) 25 MCG (1000 UT) CAPS Take 5,000 Units by mouth daily.    . diphenoxylate-atropine (LOMOTIL) 2.5-0.025 MG tablet Take 1 tablet by mouth 4 (four) times daily as needed for diarrhea or loose stools. 90 tablet 2  . EPINEPHrine (EPIPEN 2-PAK) 0.3 mg/0.3 mL IJ SOAJ injection Use as directed for severe allergic reactions 2 Device 2  . hydrochlorothiazide (HYDRODIURIL) 25 MG tablet Take 0.5 tablets (12.5 mg total) by mouth daily. 90 tablet 0  . HYDROcodone-acetaminophen (NORCO) 5-325 MG tablet Take 1 tablet by mouth every 6 (six) hours as needed for moderate pain. 30 tablet 0  . lipase/protease/amylase (CREON) 36000 UNITS CPEP capsule Take 36,000 Units by mouth as needed.    . Magnesium 200 MG TABS Take 1 tablet (200 mg total) by mouth daily. 30 each 6  . Olopatadine HCl 0.7 % SOLN Place 1 drop into both eyes as needed.    . Omega-3 Fatty Acids (FISH OIL BURP-LESS) 1000 MG CAPS Takes 1000 mg BID  60 capsule 0  . omeprazole (PRILOSEC) 40 MG capsule Take 1 capsule (40 mg total) by mouth daily. 90 capsule 0  . Polyethyl Glycol-Propyl Glycol (SYSTANE OP) Apply 1 drop to eye as needed. Apply to both eyes as needed    . rosuvastatin (CRESTOR) 5 MG tablet Take 1 tablet (5 mg total) by mouth 3 (three) times a week. 45 tablet 2  . vitamin B-12 (CYANOCOBALAMIN) 1000 MCG tablet Take 1,000 mcg by mouth daily.     . vitamin E 400 UNIT capsule Take 400 Units by mouth daily.     No current facility-administered medications on file prior to visit.     Allergies  Allergen Reactions  . Bactrim [Sulfamethoxazole-Trimethoprim] Other (See Comments)  . Nitroglycerin Anaphylaxis    Hypotensive after NTG administration in ER during chest pain evaluation Pt states she almost died,  Pt states in a procedure done in 10/2016 she had no reaction to this medication, so not sure if it is an actual allergy.   . Pentosan Polysulfate Sodium     Elevated LFTs.......Marland Kitchen elmiron   . Promethazine Hcl Other (See Comments)    "jittery on the inside"  . Sulfamethoxazole-Trimethoprim Nausea Only    Unknown reaction per pt, Pt thinks she may have felt sick to her stomach.   . Lisinopril Cough    Assessment/Plan:  1. Hyperlipidemia -    03/05/2019   7:35 AM

## 2019-03-05 NOTE — Telephone Encounter (Signed)
Called patient to see if she would like to keep appointment with PharmD this AM as her last lipid panel was good. Reviewed results with patient. Patient states that she does not think her cramps is from medication and asking if she should increase to daily. Advised that she can try to increase to rosuvastatin 5mg  daily and if she starts to notice an increase is frequency of cramps she can decrease back to three times a week. Patient also had questions about eye lid puffiness/rings under eyes and about restless leg. Advised she should speak with her PCP about these issues.  TG were slightly elevated. Patient states she does not drink. Encouraged patient to reduce sugars and carbs intake.  Should recheck lipids at next appointment with Dr. Meda Coffee.

## 2019-03-05 NOTE — Telephone Encounter (Signed)
-----   Message from Sueanne Margarita, MD sent at 03/04/2019  6:24 PM EDT ----- Stable labs - continue current meds and forward to PCP

## 2019-03-17 MED FILL — OMEPRAZOLE 40 MG CPDR: 40 | 30 days supply | Qty: 30 | Fill #5

## 2019-03-19 DIAGNOSIS — G8929 Other chronic pain: Secondary | ICD-10-CM | POA: Diagnosis not present

## 2019-03-19 DIAGNOSIS — M810 Age-related osteoporosis without current pathological fracture: Secondary | ICD-10-CM | POA: Diagnosis not present

## 2019-03-19 DIAGNOSIS — M545 Low back pain: Secondary | ICD-10-CM | POA: Diagnosis not present

## 2019-03-19 DIAGNOSIS — M7502 Adhesive capsulitis of left shoulder: Secondary | ICD-10-CM | POA: Diagnosis not present

## 2019-03-19 DIAGNOSIS — M7552 Bursitis of left shoulder: Secondary | ICD-10-CM | POA: Diagnosis not present

## 2019-03-19 DIAGNOSIS — M357 Hypermobility syndrome: Secondary | ICD-10-CM | POA: Diagnosis not present

## 2019-03-19 DIAGNOSIS — M159 Polyosteoarthritis, unspecified: Secondary | ICD-10-CM | POA: Diagnosis not present

## 2019-03-19 DIAGNOSIS — Z8739 Personal history of other diseases of the musculoskeletal system and connective tissue: Secondary | ICD-10-CM | POA: Diagnosis not present

## 2019-03-19 DIAGNOSIS — M19012 Primary osteoarthritis, left shoulder: Secondary | ICD-10-CM | POA: Diagnosis not present

## 2019-03-19 DIAGNOSIS — M779 Enthesopathy, unspecified: Secondary | ICD-10-CM | POA: Diagnosis not present

## 2019-03-19 DIAGNOSIS — Z79899 Other long term (current) drug therapy: Secondary | ICD-10-CM | POA: Diagnosis not present

## 2019-03-21 ENCOUNTER — Encounter: Payer: Self-pay | Admitting: Internal Medicine

## 2019-03-21 DIAGNOSIS — M2550 Pain in unspecified joint: Secondary | ICD-10-CM

## 2019-03-21 DIAGNOSIS — M459 Ankylosing spondylitis of unspecified sites in spine: Secondary | ICD-10-CM

## 2019-03-31 DIAGNOSIS — H25013 Cortical age-related cataract, bilateral: Secondary | ICD-10-CM | POA: Diagnosis not present

## 2019-03-31 DIAGNOSIS — H2513 Age-related nuclear cataract, bilateral: Secondary | ICD-10-CM | POA: Diagnosis not present

## 2019-03-31 DIAGNOSIS — H5213 Myopia, bilateral: Secondary | ICD-10-CM | POA: Diagnosis not present

## 2019-04-01 ENCOUNTER — Encounter: Payer: Self-pay | Admitting: Internal Medicine

## 2019-04-01 DIAGNOSIS — R350 Frequency of micturition: Secondary | ICD-10-CM

## 2019-04-02 ENCOUNTER — Other Ambulatory Visit (INDEPENDENT_AMBULATORY_CARE_PROVIDER_SITE_OTHER): Payer: Medicare Other

## 2019-04-02 ENCOUNTER — Ambulatory Visit (INDEPENDENT_AMBULATORY_CARE_PROVIDER_SITE_OTHER): Payer: Medicare Other

## 2019-04-02 ENCOUNTER — Other Ambulatory Visit: Payer: Self-pay

## 2019-04-02 DIAGNOSIS — M79641 Pain in right hand: Secondary | ICD-10-CM | POA: Diagnosis not present

## 2019-04-02 DIAGNOSIS — G5601 Carpal tunnel syndrome, right upper limb: Secondary | ICD-10-CM | POA: Diagnosis not present

## 2019-04-02 DIAGNOSIS — Z23 Encounter for immunization: Secondary | ICD-10-CM | POA: Diagnosis not present

## 2019-04-02 DIAGNOSIS — R350 Frequency of micturition: Secondary | ICD-10-CM | POA: Diagnosis not present

## 2019-04-03 LAB — URINALYSIS, ROUTINE W REFLEX MICROSCOPIC
Bilirubin Urine: NEGATIVE
Hgb urine dipstick: NEGATIVE
Ketones, ur: NEGATIVE
Leukocytes,Ua: NEGATIVE
Nitrite: NEGATIVE
RBC / HPF: NONE SEEN (ref 0–?)
Specific Gravity, Urine: 1.025 (ref 1.000–1.030)
Total Protein, Urine: NEGATIVE
Urine Glucose: NEGATIVE
Urobilinogen, UA: 0.2 (ref 0.0–1.0)
pH: 5.5 (ref 5.0–8.0)

## 2019-04-04 ENCOUNTER — Encounter: Payer: Self-pay | Admitting: Internal Medicine

## 2019-04-04 LAB — URINE CULTURE
MICRO NUMBER:: 839680
SPECIMEN QUALITY:: ADEQUATE

## 2019-04-07 MED FILL — ROSUVASTATIN CALCIUM 5 MG T: 5 | 90 days supply | Qty: 90 | Fill #2

## 2019-04-08 MED FILL — PAZEO 0.7% EYE DROPS: 0.7 | 25 days supply | Qty: 3 | Fill #3

## 2019-04-14 MED FILL — OMEPRAZOLE 40 MG CPDR: 40 | 90 days supply | Qty: 90 | Fill #0

## 2019-04-16 DIAGNOSIS — G5601 Carpal tunnel syndrome, right upper limb: Secondary | ICD-10-CM | POA: Diagnosis not present

## 2019-04-16 DIAGNOSIS — M79641 Pain in right hand: Secondary | ICD-10-CM | POA: Diagnosis not present

## 2019-04-28 DIAGNOSIS — M79641 Pain in right hand: Secondary | ICD-10-CM | POA: Diagnosis not present

## 2019-04-28 DIAGNOSIS — G5601 Carpal tunnel syndrome, right upper limb: Secondary | ICD-10-CM | POA: Diagnosis not present

## 2019-05-05 DIAGNOSIS — M79641 Pain in right hand: Secondary | ICD-10-CM | POA: Diagnosis not present

## 2019-05-05 DIAGNOSIS — M65331 Trigger finger, right middle finger: Secondary | ICD-10-CM | POA: Diagnosis not present

## 2019-05-05 DIAGNOSIS — G5601 Carpal tunnel syndrome, right upper limb: Secondary | ICD-10-CM | POA: Diagnosis not present

## 2019-05-12 DIAGNOSIS — M79641 Pain in right hand: Secondary | ICD-10-CM | POA: Diagnosis not present

## 2019-05-12 DIAGNOSIS — G5601 Carpal tunnel syndrome, right upper limb: Secondary | ICD-10-CM | POA: Diagnosis not present

## 2019-05-26 DIAGNOSIS — M79641 Pain in right hand: Secondary | ICD-10-CM | POA: Diagnosis not present

## 2019-05-26 DIAGNOSIS — G5601 Carpal tunnel syndrome, right upper limb: Secondary | ICD-10-CM | POA: Diagnosis not present

## 2019-06-02 DIAGNOSIS — M79641 Pain in right hand: Secondary | ICD-10-CM | POA: Diagnosis not present

## 2019-06-02 DIAGNOSIS — G5601 Carpal tunnel syndrome, right upper limb: Secondary | ICD-10-CM | POA: Diagnosis not present

## 2019-06-04 DIAGNOSIS — G4733 Obstructive sleep apnea (adult) (pediatric): Secondary | ICD-10-CM | POA: Diagnosis not present

## 2019-06-09 DIAGNOSIS — G5601 Carpal tunnel syndrome, right upper limb: Secondary | ICD-10-CM | POA: Diagnosis not present

## 2019-06-09 DIAGNOSIS — M19041 Primary osteoarthritis, right hand: Secondary | ICD-10-CM | POA: Diagnosis not present

## 2019-06-09 DIAGNOSIS — M19042 Primary osteoarthritis, left hand: Secondary | ICD-10-CM | POA: Diagnosis not present

## 2019-06-09 DIAGNOSIS — M79641 Pain in right hand: Secondary | ICD-10-CM | POA: Diagnosis not present

## 2019-06-16 DIAGNOSIS — G5601 Carpal tunnel syndrome, right upper limb: Secondary | ICD-10-CM | POA: Diagnosis not present

## 2019-06-23 ENCOUNTER — Telehealth: Payer: Self-pay | Admitting: Cardiology

## 2019-06-23 DIAGNOSIS — M19042 Primary osteoarthritis, left hand: Secondary | ICD-10-CM | POA: Diagnosis not present

## 2019-06-23 DIAGNOSIS — G5601 Carpal tunnel syndrome, right upper limb: Secondary | ICD-10-CM | POA: Diagnosis not present

## 2019-06-23 DIAGNOSIS — M19041 Primary osteoarthritis, right hand: Secondary | ICD-10-CM | POA: Diagnosis not present

## 2019-06-23 DIAGNOSIS — M79641 Pain in right hand: Secondary | ICD-10-CM | POA: Diagnosis not present

## 2019-06-23 MED ORDER — HYDROCHLOROTHIAZIDE 12.5 MG PO CAPS: 12.5000 mg | ORAL_CAPSULE | Freq: Every day | ORAL | 1 refills | Status: DC

## 2019-06-23 MED FILL — ROSUVASTATIN CALCIUM 5 MG T: 5 | 90 days supply | Qty: 90 | Fill #3

## 2019-06-23 MED FILL — HYDROCHLOROTHIAZIDE 12.5 MG: 12.5 | 90 days supply | Qty: 90 | Fill #0

## 2019-06-23 NOTE — Telephone Encounter (Signed)
Pt c/o medication issue:  1. Name of Medication: hydrochlorothiazide (HYDRODIURIL) 25 MG tablet  2. How are you currently taking this medication (dosage and times per day)? No  3. Are you having a reaction (difficulty breathing--STAT)? No  4. What is your medication issue? Patient is calling in regards to medication refill running out and is requesting to have 12.5 MG capsules instead of 25 MG tablets. Patient is currently out of medication.

## 2019-06-23 NOTE — Telephone Encounter (Signed)
Pt is calling in for a refill of her HCTZ 12.5 mg po daily.  Pt states that she has exhausted her current supply of the regular HCTZ tablets, and Dr. Meda Coffee told her that next time she needs a refill, we can send her in the HCTZ 12.5 mg po daily CAPSULES, instead of tablets, for this is easier for the pt to swallow.  Sent in the HCTZ 12.5 mg capsule with instruction to take 1 capsule po daily, to her confirmed pharmacy of choice for a 90 day supply. Pt verbalized understanding and agrees with this plan. Pt was more than gracious for all the assistance provided.

## 2019-06-30 DIAGNOSIS — M25562 Pain in left knee: Secondary | ICD-10-CM | POA: Diagnosis not present

## 2019-07-08 ENCOUNTER — Other Ambulatory Visit: Payer: Self-pay

## 2019-07-08 ENCOUNTER — Ambulatory Visit (INDEPENDENT_AMBULATORY_CARE_PROVIDER_SITE_OTHER): Payer: Medicare Other | Admitting: Cardiology

## 2019-07-08 ENCOUNTER — Encounter: Payer: Self-pay | Admitting: Cardiology

## 2019-07-08 VITALS — BP 122/78 | HR 52 | Ht 62.5 in | Wt 152.8 lb

## 2019-07-08 DIAGNOSIS — E785 Hyperlipidemia, unspecified: Secondary | ICD-10-CM | POA: Diagnosis not present

## 2019-07-08 DIAGNOSIS — Z1589 Genetic susceptibility to other disease: Secondary | ICD-10-CM

## 2019-07-08 DIAGNOSIS — I351 Nonrheumatic aortic (valve) insufficiency: Secondary | ICD-10-CM

## 2019-07-08 DIAGNOSIS — I2510 Atherosclerotic heart disease of native coronary artery without angina pectoris: Secondary | ICD-10-CM

## 2019-07-08 DIAGNOSIS — I10 Essential (primary) hypertension: Secondary | ICD-10-CM

## 2019-07-08 MED ORDER — ROSUVASTATIN CALCIUM 5 MG PO TABS: 5.0000 mg | ORAL_TABLET | Freq: Every day | ORAL | 2 refills | Status: DC

## 2019-07-08 MED ORDER — LOSARTAN POTASSIUM 25 MG PO TABS: 25.0000 mg | ORAL_TABLET | Freq: Every day | ORAL | 2 refills | Status: DC

## 2019-07-08 MED FILL — LOSARTAN POTASSIUM 25 MG TA: 25 | 90 days supply | Qty: 90 | Fill #0

## 2019-07-08 NOTE — Progress Notes (Signed)
Cardiology Office Note    Date:  07/08/2019   ID:  [name], [name] July 23, 1946, MRN [id number]  PCP:  Binnie Rail, MD  Cardiologist:  Ena Dawley, MD   Chief complain: 6 months follow up  History of Present Illness:  [name] is a 73 y.o. female with prior history of hypertension, hyperlipidemia who was previously seen in our office in 2014 for concerns of chest pain and underwent an exercise nuclear stress test that showed LVEF of 70% and no prior infarct or ischemia. She is coming 3 years later for occasional left-sided chest pain not related to exertion. She has been treated with Toprol for high blood pressure. She complains of fatigue and occasional dizziness. She also has occasional lower extremity edema. Denies any orthopnea or proximal nocturnal dyspnea. 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 She has never had an echocardiogram to evaluate for aortic regurgitation or ascending aortic aneurysm. She denies any palpitations or syncope.  10/17/2016 - the patient is coming after 4 weeks, she has visited ER on 10/12/16 with dizziness and chest pain and was discharged home after ACS was ruled out. She states that she feels dizzy ever since she started Benicar. She brings her BP diary and BP in the range 96-150, but most in 120-130.  She has also been experiencing DOE after walking 1 flight of stair associated with left sided chest pain that is pressure like and has no radiation. She has occassional LE edema.   07/03/2018 -6 months follow-up, the patient has been experiencing back pain and was diagnosed with spinal stenosis for which she received steroid injection complicated by inability to walk for couple hours.  This is now resolved.  She feels slightly more dizzy, her blood pressure is elevated today, she has minimal lower extremity edema.  She went to the ER in October with chest pain, ACS was ruled out and after Prilosec was  restarted her symptoms have resolved.  Dec 25, 2018 -the patient was started on spironolactone but did not tolerated well and was switched back to lower dose of hydrochlorothiazide 12.5 mg daily, her lower extremity edema has improved significantly she has persistent facial/eyelid edema.  Her lower extremity cramping improved with discontinuation of rosuvastatin.  07/08/2019 -this is 6 months follow-up, the patient is doing well, she is experiencing a mild occasional dizziness, she is not orthostatic.  No falls.  No syncope.  No palpitations.  Mild puffiness around her ankle.  She denies any chest pain or shortness of breath.  She has been compliant with her medications and tolerating them well.  Past Medical History:  Diagnosis Date   Allergy    seasonal   Ankylosing spondylitis ([facility])    Anxiety    Arthritis    Bell palsy    CTS (carpal tunnel syndrome)    Cystitis    Diverticulitis    Diverticulosis of colon    Dry eye syndrome    Exocrine pancreatic insufficiency    Frozen shoulder    GERD (gastroesophageal reflux disease)    HLA B27 (HLA B27 positive)    Hx of colonic polyps    Hyperlipidemia    NMR 2005   Hypertension    Hypothyroidism    Incontinence    Internal hemorrhoids    Interstitial cystitis    Kidney stone    Menopause    Osteoarthritis    Osteopenia    [facility] ,  [address]   Pre-diabetes    S/P total hysterectomy and bilateral salpingo-oophorectomy 04/12/2014   Sinusitis    Sleep apnea    c-pap   Subjective visual disturbance of both eyes 12/30/2013   Tubular adenoma of colon    Vitamin D deficiency    Xerostomia    and Xeroophthalmia    Past Surgical History:  Procedure Laterality Date   BLADDER SURGERY     for incontinence   CARPAL TUNNEL RELEASE Left 12/22/2014   Procedure: LEFT CARPAL TUNNEL RELEASE;  Surgeon: Daryll Brod, MD;  Location: [facility];  Service: Orthopedics;  Laterality:  Left;   CARPAL TUNNEL RELEASE Right 02/18/2019   Procedure: RIGHT CARPAL TUNNEL RELEASE;  Surgeon: Daryll Brod, MD;  Location: [facility];  Service: Orthopedics;  Laterality: Right;   COLONOSCOPY W/ POLYPECTOMY     adenomatous poyp 04-2008   colonscopy     tics 10-2002   CYSTOSCOPY WITH RETROGRADE PYELOGRAM, URETEROSCOPY AND STENT PLACEMENT Left 09/01/2013   Procedure: CYSTOSCOPY WITH RETROGRADE PYELOGRAM, AND LEFT STENT PLACEMENT;  Surgeon: Molli Hazard, MD;  Location: [facility];  Service: Urology;  Laterality: Left;   elevated LFTs     due to Elmiron   FINGER SURGERY Left 2004   2nd finger   G 1 P 1     interstitial cystitis     with clot in catheter   PARTIAL HYSTERECTOMY     with one ovary left   SHOULDER ARTHROSCOPY Left    TOTAL ABDOMINAL HYSTERECTOMY W/ BILATERAL SALPINGOOPHORECTOMY     dysfunctional menses    Current Medications: Outpatient Medications Prior to Visit  Medication Sig Dispense Refill   acetaminophen (TYLENOL) 500 MG tablet Take 1,000 mg by mouth 3 (three) times daily as needed for mild pain.      Ascorbic Acid (VITAMIN C) 1000 MG tablet Take 1,000 mg by mouth daily.      aspirin 81 MG tablet Take 81 mg by mouth daily.       azelastine (ASTELIN) 0.1 % nasal spray One to two sprays each nostril twice a day as needed. 30 mL 5   blood glucose meter kit and supplies KIT Dispense based on patient and insurance preference. Use up to four times daily as directed. prediabetes 1 each 0   CALCIUM PO Take 1,000 mg by mouth daily.     cetirizine (ZYRTEC) 10 MG tablet Take 10 mg by mouth daily.     Cholecalciferol (VITAMIN D-3) 25 MCG (1000 UT) CAPS Take 5,000 Units by mouth daily.     diphenoxylate-atropine (LOMOTIL) 2.5-0.025 MG tablet Take 1 tablet by mouth 4 (four) times daily as needed for diarrhea or loose stools. 90 tablet 2   EPINEPHrine (EPIPEN 2-PAK) 0.3 mg/0.3 mL IJ SOAJ injection Use as directed for severe allergic  reactions 2 Device 2   Ferrous Sulfate (IRON PO) Take 25 mg by mouth daily.     lipase/protease/amylase (CREON) 36000 UNITS CPEP capsule Take 36,000 Units by mouth as needed.     Magnesium 200 MG TABS Take 1 tablet (200 mg total) by mouth daily. 30 each 6   Multiple Vitamins-Minerals (CENTRUM SILVER 50+WOMEN PO) Take 1 tablet by mouth daily.     Olopatadine HCl 0.7 % SOLN Place 1 drop into both eyes as needed.     Omega-3 Fatty Acids (FISH OIL BURP-LESS) 1000 MG CAPS Takes 1000 mg BID 60 capsule 0   omeprazole (PRILOSEC) 40 MG capsule Take 1 capsule (40  mg total) by mouth daily. 90 capsule 0   ondansetron (ZOFRAN) 4 MG tablet as needed.     Polyethyl Glycol-Propyl Glycol (SYSTANE OP) Apply 1 drop to eye as needed. Apply to both eyes as needed     Thiamine Mononitrate (HM VITAMIN B1) 100 MG TABS Take by mouth daily.     vitamin B-12 (CYANOCOBALAMIN) 1000 MCG tablet Take 1,000 mcg by mouth daily.      vitamin E 400 UNIT capsule Take 400 Units by mouth daily.     zinc gluconate 50 MG tablet Take 50 mg by mouth daily.     hydrochlorothiazide (MICROZIDE) 12.5 MG capsule Take 1 capsule (12.5 mg total) by mouth daily. 90 capsule 1   rosuvastatin (CRESTOR) 5 MG tablet Take 1 tablet (5 mg total) by mouth 3 (three) times a week. 45 tablet 2   HYDROcodone-acetaminophen (NORCO) 5-325 MG tablet Take 1 tablet by mouth every 6 (six) hours as needed for moderate pain. (Patient not taking: Reported on 07/08/2019) 30 tablet 0   No facility-administered medications prior to visit.      Allergies:   Bactrim [sulfamethoxazole-trimethoprim], Nitroglycerin, Pentosan polysulfate sodium, Promethazine hcl, Sulfamethoxazole-trimethoprim, and Lisinopril   Social History   Socioeconomic History   Marital status: Married    Spouse name: Not on file   Number of children: 1   Years of education: BS   Highest education level: Not on file  Occupational History   Occupation: retired   Occupation:  Pharmacist, hospital    Comment: Kindergarten  Scientist, product/process development strain: Not hard at all   Food insecurity    Worry: Never true    Inability: Never true   Transportation needs    Medical: No    Non-medical: No  Tobacco Use   Smoking status: Never Smoker   Smokeless tobacco: Never Used  Substance and Sexual Activity   Alcohol use: Yes    Comment: social   Drug use: No   Sexual activity: Not Currently  Lifestyle   Physical activity    Days per week: 0 days    Minutes per session: 0 min   Stress: Rather much  Relationships   Social connections    Talks on phone: More than three times a week    Gets together: More than three times a week    Attends religious service: Not on file    Active member of club or organization: Not on file    Attends meetings of clubs or organizations: Not on file    Relationship status: Married  Other Topics Concern   Not on file  Social History Narrative   Lives   Caffeine use:    Married       Family History:  The patient's family history includes Allergic rhinitis in her sister and sister; Asthma in her mother; Breast cancer in her maternal aunt; Cirrhosis in her father; Colon cancer (age of onset: 27) in her sister; Coronary artery disease in her maternal grandfather and mother; Dementia in her mother; Diabetes in her father, paternal aunt, and paternal grandmother; Food Allergy in her sister; Hypertension in her mother; Renal cancer in her paternal aunt; Stroke in her mother.   ROS:   Please see the history of present illness.    ROS All other systems reviewed and are negative.   PHYSICAL EXAM:   VS:  BP 122/78    Pulse (!) 52    Ht 5' 2.5" (1.588 m)    Wt 152  lb 12.8 oz (69.3 kg)    SpO2 99%    BMI 27.50 kg/m    GEN: Well nourished, well developed, in no acute distress  HEENT: normal  Neck: no JVD, carotid bruits, or masses Cardiac: RRR; no murmurs, rubs, or gallops,no edema  Respiratory:  clear to auscultation  bilaterally, normal work of breathing GI: soft, nontender, nondistended, + BS MS: no deformity or atrophy  Skin: warm and dry, no rash Neuro:  Alert and Oriented x 3, Strength and sensation are intact Psych: euthymic mood, full affect  Wt Readings from Last 3 Encounters:  07/08/19 152 lb 12.8 oz (69.3 kg)  02/18/19 156 lb 12 oz (71.1 kg)  12/25/18 156 lb (70.8 kg)    Studies/Labs Reviewed:   EKG:  EKG is ordered today.  The ekg ordered today demonstrates sinus bradycardia, 56 bpm, left axis deviation, nonspecific T wave abnormalities.  Recent Labs: 12/24/2018: Hemoglobin 12.6; Platelets 233.0; TSH 2.68 02/14/2019: BUN 17; Creatinine, Ser 1.15; Potassium 4.1; Sodium 140 02/27/2019: ALT 19   Lipid Panel    Component Value Date/Time   CHOL 139 02/27/2019 0807   TRIG 167 (H) 02/27/2019 0807   TRIG 181 (H) 07/23/2006 1101   HDL 59 02/27/2019 0807   CHOLHDL 2.4 02/27/2019 0807   CHOLHDL 2 05/03/2018 0913   VLDL 19.0 05/03/2018 0913   LDLCALC 47 02/27/2019 0807   LDLDIRECT 114.0 05/02/2016 0919   Additional studies/ records that were reviewed today include:   ECG performed today 12/31/2017 shows sinus bradycardia 46 bpm, left anterior fascicular block, heart rate slower than previously otherwise unchanged, this was personally reviewed.   ASSESSMENT:    1. Essential hypertension   2. HLA B27 (HLA B27 positive)   3. Essential (primary) hypertension   4. Coronary artery disease involving native coronary artery of native heart without angina pectoris   5. Aortic valve insufficiency, etiology of cardiac valve disease unspecified   6. Hyperlipidemia, unspecified hyperlipidemia type     PLAN:  In order of problems listed above:  1. Hypertension -I will discontinue hydrochlorothiazide and start losartan 25 mg daily as hydrochlorothiazide might be causing dehydration she also has borderline diabetes. 2. Hyperlipidemia -LDL and HDL at goal with rosuvastatin.  She is tolerating  rosuvastatin 5 mg daily.  We will continue.   3. Chest pain - negative stress test in the past, calcium score and coronary CTA showed coronary calcium score of 56. This was 59 percentile for age and sex matched control. Moderate CAD in RCA/PDA, CT FFR was normal.He is currently asymptomatic, encouraged to walk daily.   4. HLA B gene positivity - associated ankylosing spondylitis, echo in 09/2016 showed no ascending aortic aneurysm but mild aortic regurgitation. We will follow every other year. 5. LE edema -resolved.    Follow-up with blood pressure clinic in 3 weeks, follow-up with [place] in 4 months.  Medication Adjustments/Labs and Tests Ordered: Current medicines are reviewed at length with the patient today.  Concerns regarding medicines are outlined above.  Medication changes, Labs and Tests ordered today are listed in the Patient Instructions below. There are no Patient Instructions on file for this visit.   Signed, Ena Dawley, MD  07/08/2019 9:54 AM    [facility] [address], [place], [place]  [address] Phone: [phone]; Fax: [phone]

## 2019-07-08 NOTE — Patient Instructions (Signed)
Medication Instructions:   STOP TAKING HYDROCHLOROTHIAZIDE (HCTZ) NOW  START TAKING LOSARTAN 25 MG BY MOUTH DAILY  *If you need a refill on your cardiac medications before your next appointment, please call your pharmacy*    Follow-Up: At [facility], you and your health needs are our priority.  As part of our continuing mission to provide you with exceptional heart care, we have created designated Provider Care Teams.  These Care Teams include your primary Cardiologist (physician) and Advanced Practice Providers (APPs -  Physician Assistants and Nurse Practitioners) who all work together to provide you with the care you need, when you need it.  Your next appointment:   6 month(s)  The format for your next appointment:   In Person  Provider:   Ena Dawley, MD

## 2019-07-21 ENCOUNTER — Other Ambulatory Visit: Payer: Self-pay | Admitting: Internal Medicine

## 2019-07-21 ENCOUNTER — Other Ambulatory Visit: Payer: Self-pay | Admitting: Allergy & Immunology

## 2019-07-21 DIAGNOSIS — K219 Gastro-esophageal reflux disease without esophagitis: Secondary | ICD-10-CM

## 2019-07-21 MED FILL — CELECOXIB 200 MG CAP: 200 | 30 days supply | Qty: 30 | Fill #0

## 2019-07-21 MED FILL — OMEPRAZOLE 40 MG CPDR: 40 | 90 days supply | Qty: 90 | Fill #0

## 2019-07-21 MED FILL — PAZEO 0.7% EYE DROPS: 0.7 | 25 days supply | Qty: 3 | Fill #0

## 2019-09-04 DIAGNOSIS — G4733 Obstructive sleep apnea (adult) (pediatric): Secondary | ICD-10-CM | POA: Diagnosis not present

## 2019-09-10 DIAGNOSIS — M79641 Pain in right hand: Secondary | ICD-10-CM | POA: Diagnosis not present

## 2019-09-10 DIAGNOSIS — M19042 Primary osteoarthritis, left hand: Secondary | ICD-10-CM | POA: Diagnosis not present

## 2019-09-10 DIAGNOSIS — M1811 Unilateral primary osteoarthritis of first carpometacarpal joint, right hand: Secondary | ICD-10-CM | POA: Diagnosis not present

## 2019-09-10 DIAGNOSIS — M65341 Trigger finger, right ring finger: Secondary | ICD-10-CM | POA: Diagnosis not present

## 2019-09-10 DIAGNOSIS — M19041 Primary osteoarthritis, right hand: Secondary | ICD-10-CM | POA: Diagnosis not present

## 2019-09-10 DIAGNOSIS — M19012 Primary osteoarthritis, left shoulder: Secondary | ICD-10-CM | POA: Diagnosis not present

## 2019-09-10 DIAGNOSIS — M65331 Trigger finger, right middle finger: Secondary | ICD-10-CM | POA: Diagnosis not present

## 2019-09-10 DIAGNOSIS — M79642 Pain in left hand: Secondary | ICD-10-CM | POA: Diagnosis not present

## 2019-09-11 ENCOUNTER — Telehealth: Payer: Self-pay | Admitting: Cardiology

## 2019-09-11 MED ORDER — LOSARTAN POTASSIUM 50 MG PO TABS: 50.0000 mg | ORAL_TABLET | Freq: Every day | ORAL | 1 refills | Status: DC

## 2019-09-11 MED FILL — LOSARTAN POTASSIUM 50 MG TA: 50 | 30 days supply | Qty: 30 | Fill #0

## 2019-09-11 NOTE — Telephone Encounter (Signed)
Pt c/o BP issue: STAT if pt c/o blurred vision, one-sided weakness or slurred speech  1. What are your last 5 BP readings?  BP: 146/81  HR: 61 BP: 144/86   HR: 72 BP: 138/104   HR: 74    2. Are you having any other symptoms (ex. Dizziness, headache, blurred vision, passed out)? No  3. What is your BP issue? Patient states that her BP has been elevated due to medication changes. Patient is inquiring about whether she should continue taking losartan (COZAAR) 25 MG tablet medication.

## 2019-09-11 NOTE — Telephone Encounter (Signed)
Spoke with the pt and informed her that per Dr. Meda Coffee, we will increase her losartan to 50 mg po daily. Pt states that she only wants a month supply of this med sent to her pharmacy, for she will send [place] some more readings in 2 weeks with the increased dose, and if that time she is doing well, she will request a bigger supply of this med to be sent to her pharmacy.  Pt states she will record her BP for 2 weeks, then call us back with the readings thereafter, with new dose change. Pt verbalized understanding and agrees with this plan.

## 2019-09-11 NOTE — Telephone Encounter (Signed)
Please increase to losartan 50 mg p.o. daily.

## 2019-09-11 NOTE — Telephone Encounter (Signed)
Dr. Meda Coffee, pt was started on losartan 25 mg po daily back at last OV on 07/08/19.  She has been taking this and tracking her BP readings.  The following are her numbers, as mentioned in this message.  She has no cardiac complaints at all. She is inquiring if her readings are too high since the med change was made, and if so, should there be some adjustments made, or should her regimen be changed all together.  Please advise.

## 2019-09-17 ENCOUNTER — Other Ambulatory Visit: Payer: Self-pay

## 2019-09-17 ENCOUNTER — Ambulatory Visit (INDEPENDENT_AMBULATORY_CARE_PROVIDER_SITE_OTHER): Payer: Medicare Other | Admitting: Internal Medicine

## 2019-09-17 ENCOUNTER — Encounter: Payer: Self-pay | Admitting: Internal Medicine

## 2019-09-17 VITALS — BP 124/60 | HR 69 | Ht 62.0 in | Wt 154.5 lb

## 2019-09-17 DIAGNOSIS — K8681 Exocrine pancreatic insufficiency: Secondary | ICD-10-CM | POA: Diagnosis not present

## 2019-09-17 DIAGNOSIS — K589 Irritable bowel syndrome without diarrhea: Secondary | ICD-10-CM | POA: Diagnosis not present

## 2019-09-17 DIAGNOSIS — R32 Unspecified urinary incontinence: Secondary | ICD-10-CM

## 2019-09-17 DIAGNOSIS — K219 Gastro-esophageal reflux disease without esophagitis: Secondary | ICD-10-CM | POA: Diagnosis not present

## 2019-09-17 MED ORDER — DIPHENOXYLATE-ATROPINE 2.5-0.025 MG PO TABS: 1.0000 | ORAL_TABLET | Freq: Four times a day (QID) | ORAL | 2 refills | Status: DC | PRN

## 2019-09-17 MED ORDER — ONDANSETRON HCL 4 MG PO TABS: 4.0000 mg | ORAL_TABLET | Freq: Three times a day (TID) | ORAL | 2 refills | Status: DC | PRN

## 2019-09-17 MED ORDER — DICYCLOMINE HCL 10 MG PO CAPS: 10.0000 mg | ORAL_CAPSULE | Freq: Three times a day (TID) | ORAL | 2 refills | Status: DC | PRN

## 2019-09-17 MED FILL — DIPHENOXYLATE-ATROPINE 2.5-: 2.5-0.025 | 23 days supply | Qty: 90 | Fill #0

## 2019-09-17 MED FILL — DICYCLOMINE 10 MG CAPSULE: 10 | 30 days supply | Qty: 90 | Fill #0

## 2019-09-17 MED FILL — ONDANSETRON HCL 4 MG TABLET: 4 | 13 days supply | Qty: 40 | Fill #0

## 2019-09-17 NOTE — Progress Notes (Signed)
Subjective:    Patient ID: [name], female    DOB: 08/11/1945, 74 y.o.   MRN: [id number]  HPI [name] is a 74 year old female with PMH of IBS with overlap EPI, hx of colon polyps, hx of diverticulosis, GERD and family history of colon cancer who is seen for followup.  She is here today with her husband and was last seen virtually in April 2020.  PMH also includes RLS, OSA, osteoporosis, arthritis, HL and DM.    Currently she reports that she has been doing fairly well from a GI perspective.  She does at times have cramping in her mid and lower abdomen which does not always relate to bowel movement.  Bowel movements have been regular without diarrhea occurring usually once in the morning and again in the evening.  She will occasionally have a 2 to 3-day period without bowel movement.  She has not had diarrhea though maybe slightly loose stool.  No visible blood in her stool or melena though she does see some visible painless red blood with wiping on occasion.  Around 05/16/2019 she had an acute attack of abdominal cramping which was moderate to severe with bloating and fecal urgency.  She also had vagal type symptoms including sweating and dizziness.  This resolved over a short period of time and has not recurred.  She is only using Creon if needed which is quite rare.  She is on omeprazole.  She is rarely using Lomotil or Zofran but likes to have this on hand.  She had does still have issues with urinary incontinence and occasionally with urinating she will pass a small amount of stool.  She does have to wear a urinary pad for leakage.  She had prior vaginal hysterectomy complicated by hemorrhage.  She may have had a rectocele repaired at the same time this was in the mid 90s.  She has delayed her shoulder surgery but has also recovering from carpal tunnel surgery and trigger finger after that surgery.  She is going through rehab.   Review of Systems As per HPI, otherwise negative  Current  Medications, Allergies, Past Medical History, Past Surgical History, Family History and Social History were reviewed in [employer] record.     Objective:   Physical Exam BP 124/60   Pulse 69   Ht 5\' 2"  (1.575 m)   Wt 154 lb 8 oz (70.1 kg)   SpO2 100%   BMI 28.26 kg/m  Gen: awake, alert, NAD HEENT: anicteric CV: RRR, no mrg Pulm: CTA b/l Abd: soft, NT/ND, +BS throughout Ext: no c/c/e Neuro: nonfocal      Assessment & Plan:  74 year old female with PMH of IBS with overlap EPI, hx of colon polyps, hx of diverticulosis, GERD and family history of colon cancer who is seen for followup.  1.  IBS with history of pancreatic insufficiency --he is doing well currently.  The episode she described in October which started abruptly sounds like a mild attack of ischemic colitis.  We discussed this today.  Her diarrhea has basically resolved without the need for Creon.  I am going to give her some Bentyl to help with crampy abdominal discomfort though I warned her that this could cause constipation and so she should let me --She can use Creon, Lomotil and Zofran her instruction on an as-needed basis --Bentyl 10 mg 3 times daily as needed  2.  History of GERD with globus sensation --doing well, continue  omeprazole  3.  History of colon polyps --surveillance colonoscopy recommended July 2024  4.  Urinary incontinence/pelvic floor dysfunction --she has seen Dr. [place] Pink in the past with urogynecology at [facility].  I encouraged her to follow-up with her.  6 to 31-month follow-up, sooner if needed 30 minutes total spent today including patient facing time, coordination of care, reviewing medical history/procedures/pertinent radiology studies, and documentation of the encounter.

## 2019-09-17 NOTE — Patient Instructions (Addendum)
Continue Creon as needed.  Continue omeprazole.  We have sent the following medications to your pharmacy for you to pick up at your convenience: Lomotil 1 tablet four times daily as needed for diarrhea Zofran 1 tablet every 8 hours as needed for nausea/vomiting Dicyclomine 1 capsule three times daily as needed for spasm/diarrhea  Please follow up with your urogynocologist, Dr Zigmund Daniel for your pelvic floor dysfunction.  Please follow up with Dr Hilarie Fredrickson in 6 months.  If you are age 64 or older, your body mass index should be between 23-30. Your Body mass index is 28.26 kg/m. If this is out of the aforementioned range listed, please consider follow up with your Primary Care Provider.  If you are age 64 or younger, your body mass index should be between 19-25. Your Body mass index is 28.26 kg/m. If this is out of the aformentioned range listed, please consider follow up with your Primary Care Provider.

## 2019-09-24 ENCOUNTER — Telehealth: Payer: Self-pay | Admitting: Allergy and Immunology

## 2019-09-24 ENCOUNTER — Other Ambulatory Visit: Payer: Self-pay

## 2019-09-24 ENCOUNTER — Encounter: Payer: Self-pay | Admitting: Allergy and Immunology

## 2019-09-24 ENCOUNTER — Ambulatory Visit (INDEPENDENT_AMBULATORY_CARE_PROVIDER_SITE_OTHER): Payer: Medicare Other | Admitting: Allergy and Immunology

## 2019-09-24 DIAGNOSIS — J3089 Other allergic rhinitis: Secondary | ICD-10-CM

## 2019-09-24 DIAGNOSIS — H1013 Acute atopic conjunctivitis, bilateral: Secondary | ICD-10-CM | POA: Diagnosis not present

## 2019-09-24 DIAGNOSIS — Z91018 Allergy to other foods: Secondary | ICD-10-CM | POA: Diagnosis not present

## 2019-09-24 DIAGNOSIS — R040 Epistaxis: Secondary | ICD-10-CM | POA: Diagnosis not present

## 2019-09-24 MED ORDER — EPINEPHRINE 0.3 MG/0.3ML IJ SOAJ: INTRAMUSCULAR | 1 refills | Status: DC

## 2019-09-24 MED ORDER — LEVOCETIRIZINE DIHYDROCHLORIDE 5 MG PO TABS: 5.0000 mg | ORAL_TABLET | Freq: Every day | ORAL | 5 refills | Status: DC | PRN

## 2019-09-24 MED ORDER — OLOPATADINE HCL 0.1 % OP SOLN: 1.0000 [drp] | Freq: Two times a day (BID) | OPHTHALMIC | 12 refills | Status: DC

## 2019-09-24 MED ORDER — OLOPATADINE HCL 0.2 % OP SOLN: 1.0000 [drp] | Freq: Every day | OPHTHALMIC | 5 refills | Status: DC | PRN

## 2019-09-24 MED FILL — LEVOCETIRIZINE 5 MG TABLET: 5 | 30 days supply | Qty: 30 | Fill #0

## 2019-09-24 MED FILL — OLOPATADINE HCL 0.1 % SOLN: 0.1 | 25 days supply | Qty: 5 | Fill #0

## 2019-09-24 MED FILL — EPINEPHRINE 0.3 MG AUTO-INJ: 0.3 | 2 days supply | Qty: 2 | Fill #0

## 2019-09-24 NOTE — Telephone Encounter (Signed)
RX has been switched over the patanol

## 2019-09-24 NOTE — Patient Instructions (Addendum)
Perennial and seasonal allergic rhinitis  Continue aeroallergen avoidance measures.  A prescription has been provided for levocetirizine(Xyzal), 5 mg daily as needed.    To avoid diminishing benefit with daily use (tachyphylaxis) of second generation antihistamine, consider alternating every few months between fexofenadine (Allegra) and levocetirizine (Xyzal).  A prescription has been provided for azelastine/fluticasone nasal spray, 1 spray per nostril 2 times daily as needed. Proper nasal spray technique has been discussed and demonstrated.   Nasal saline spray (i.e., Simply Saline) or nasal saline lavage (i.e., NeilMed) is recommended as needed and prior to medicated nasal sprays.  Allergic conjunctivitis  Treatment plan as outlined above for allergic rhinitis.  A refill prescription has been provided for Pataday, one drop per eye daily as needed.  I have also recommended eye lubricant drops (i.e., Natural Tears) as needed.  If this problem persists or progresses despite treatment plan as outlined above, consider evaluation by an ophthalmologist.  Epistaxis Quiescent.  Proper nasal spray technique has been reviewed.  If epistaxis begins to recur, we will discontinue nasal steroid spray.  Food allergy Regarding nuts, component testing to peanuts and tree nuts supports oral allergy syndrome (cross-reactivity with pollens). In addition, the patient reports that she has been consuming nuts without symptoms.  Continue careful avoidance of beans and pork and have access to epinephrine autoinjector 2 pack in case of accidental ingestion.  A refill prescription has been provided for epinephrine 0.3 mg autoinjector (EpiPen) 2 pack along with instructions for its proper administration.   Return in about 1 year (around 09/23/2020), or if symptoms worsen or fail to improve.

## 2019-09-24 NOTE — Assessment & Plan Note (Addendum)
Regarding nuts, component testing to peanuts and tree nuts supports oral allergy syndrome (cross-reactivity with pollens). In addition, the patient reports that she has been consuming nuts without symptoms.  Continue careful avoidance of beans and pork and have access to epinephrine autoinjector 2 pack in case of accidental ingestion.  A refill prescription has been provided for epinephrine 0.3 mg autoinjector (EpiPen) 2 pack along with instructions for its proper administration.

## 2019-09-24 NOTE — Telephone Encounter (Signed)
Pharmacy rep Lauralyn Primes called to say the generic eye drop pataday is not covered, but patanol is covered. Please call back to authorized switch. [phone]

## 2019-09-24 NOTE — Assessment & Plan Note (Signed)
   Treatment plan as outlined above for allergic rhinitis.  A refill prescription has been provided for Pataday, one drop per eye daily as needed.  I have also recommended eye lubricant drops (i.e., Natural Tears) as needed.  If this problem persists or progresses despite treatment plan as outlined above, consider evaluation by an ophthalmologist.

## 2019-09-24 NOTE — Assessment & Plan Note (Signed)
Quiescent.  Proper nasal spray technique has been reviewed.  If epistaxis begins to recur, we will discontinue nasal steroid spray.

## 2019-09-24 NOTE — Progress Notes (Signed)
Follow-up Note  RE: [name] MRN: [id number] DOB: December 31, 1945 Date of Office Visit: 09/24/2019  Primary care provider: Binnie Rail, MD Referring provider: Binnie Rail, MD  History of present illness: [name] is a 74 y.o. female with allergic rhinoconjunctivitis, history of epistaxis, and food allergy presenting today for follow-up.  She was last seen in this clinic in February 2020.  She reports that she still has been experiencing some nasal congestion and rhinorrhea.  However, notes that if she forgets to take cetirizine she experiences additional rhinorrhea. She has been taking cetirizine daily for several months. She admits that she has not been using a nasal spray. She experiences watery eyes. She has not had epistaxis in the interval since her previous visit.  She reports that is able to consume nuts without symptoms. She still avoids beans and pork because of previous reactions. She needs a refill for medications including epinephrine.   Assessment and plan: Perennial and seasonal allergic rhinitis  Continue aeroallergen avoidance measures.  A prescription has been provided for levocetirizine(Xyzal), 5 mg daily as needed.    To avoid diminishing benefit with daily use (tachyphylaxis) of second generation antihistamine, consider alternating every few months between fexofenadine (Allegra) and levocetirizine (Xyzal).  A prescription has been provided for azelastine/fluticasone nasal spray, 1 spray per nostril 2 times daily as needed. Proper nasal spray technique has been discussed and demonstrated.   Nasal saline spray (i.e., Simply Saline) or nasal saline lavage (i.e., NeilMed) is recommended as needed and prior to medicated nasal sprays.  Allergic conjunctivitis  Treatment plan as outlined above for allergic rhinitis.  A refill prescription has been provided for Pataday, one drop per eye daily as needed.  I have also recommended eye lubricant drops (i.e.,  Natural Tears) as needed.  If this problem persists or progresses despite treatment plan as outlined above, consider evaluation by an ophthalmologist.  Epistaxis Quiescent.  Proper nasal spray technique has been reviewed.  If epistaxis begins to recur, we will discontinue nasal steroid spray.  Food allergy Regarding nuts, component testing to peanuts and tree nuts supports oral allergy syndrome (cross-reactivity with pollens). In addition, the patient reports that she has been consuming nuts without symptoms.  Continue careful avoidance of beans and pork and have access to epinephrine autoinjector 2 pack in case of accidental ingestion.  A refill prescription has been provided for epinephrine 0.3 mg autoinjector (EpiPen) 2 pack along with instructions for its proper administration.   Meds ordered this encounter  Medications  . levocetirizine (XYZAL) 5 MG tablet    Sig: Take 1 tablet (5 mg total) by mouth daily as needed for allergies.    Dispense:  30 tablet    Refill:  5  . Olopatadine HCl (PATADAY) 0.2 % SOLN    Sig: Place 1 drop into both eyes daily as needed.    Dispense:  2.5 mL    Refill:  5  . EPINEPHrine (EPIPEN 2-PAK) 0.3 mg/0.3 mL IJ SOAJ injection    Sig: Use as directed for severe allergic reactions    Dispense:  2 each    Refill:  1    Physical examination: Blood pressure 136/82, pulse 64, temperature 98.1 F (36.7 C), temperature source Oral, resp. rate 20, height 5' 1.61" (1.565 m), weight 158 lb 11.7 oz (72 kg), SpO2 96 %.  General: Alert, interactive, in no acute distress. HEENT: TMs pearly gray, turbinates mildly edematous without discharge, post-pharynx mildly erythematous. Neck: Supple without lymphadenopathy. Lungs:  Clear to auscultation without wheezing, rhonchi or rales. CV: Normal S1, S2 without murmurs. Skin: Warm and dry, without lesions or rashes.  The following portions of the patient's history were reviewed and updated as appropriate:  allergies, current medications, past family history, past medical history, past social history, past surgical history and problem list.   Current Outpatient Medications  Medication Sig Dispense Refill  . acetaminophen (TYLENOL) 500 MG tablet Take 1,000 mg by mouth 3 (three) times daily as needed for mild pain.     . ACETAMINOPHEN PO acetaminophen    . Ascorbic Acid (VITAMIN C) 1000 MG tablet Take 1,000 mg by mouth daily.     Marland Kitchen aspirin 81 MG tablet Take 81 mg by mouth daily.      Marland Kitchen azelastine (ASTELIN) 0.1 % nasal spray One to two sprays each nostril twice a day as needed. 30 mL 5  . blood glucose meter kit and supplies KIT Dispense based on patient and insurance preference. Use up to four times daily as directed. prediabetes 1 each 0  . CALCIUM PO Take 1,000 mg by mouth daily.    . cetirizine (ZYRTEC) 10 MG tablet Take 10 mg by mouth daily.    . Cholecalciferol (VITAMIN D-3) 25 MCG (1000 UT) CAPS Take 5,000 Units by mouth daily.    Marland Kitchen dicyclomine (BENTYL) 10 MG capsule Take 1 capsule (10 mg total) by mouth 3 (three) times daily as needed for spasms. 90 capsule 2  . diphenoxylate-atropine (LOMOTIL) 2.5-0.025 MG tablet Take 1 tablet by mouth 4 (four) times daily as needed for diarrhea or loose stools. 90 tablet 2  . EPINEPHrine (EPIPEN 2-PAK) 0.3 mg/0.3 mL IJ SOAJ injection Use as directed for severe allergic reactions 2 each 1  . ferrous gluconate (FERGON) 240 (27 FE) MG tablet Take by mouth.    . Ferrous Sulfate (IRON PO) Take 25 mg by mouth daily.    . lipase/protease/amylase (CREON) 36000 UNITS CPEP capsule Take 36,000 Units by mouth as needed.    Marland Kitchen losartan (COZAAR) 25 MG tablet losartan 25 mg tablet  Take 1 tablet every day by oral route.    . Magnesium 200 MG TABS Take 1 tablet (200 mg total) by mouth daily. 30 each 6  . Magnesium Oxide 200 MG TABS magnesium 200 mg (as magnesium oxide) tablet  Take by oral route.    . Multiple Vitamins-Minerals (CENTRUM SILVER 50+WOMEN PO) Take 1 tablet  by mouth daily.    . Omega-3 Fatty Acids (FISH OIL BURP-LESS) 1000 MG CAPS Takes 1000 mg BID 60 capsule 0  . omeprazole (PRILOSEC) 40 MG capsule TAKE 1 CAPSULE BY MOUTH ONCE DAILY 90 capsule 0  . ondansetron (ZOFRAN) 4 MG tablet Take 1 tablet (4 mg total) by mouth every 8 (eight) hours as needed. 40 tablet 2  . PAZEO 0.7 % SOLN PLACE 1 DROP INTO BOTH EYES DAILY. 2.5 mL 2  . Polyethyl Glycol-Propyl Glycol (SYSTANE OP) Apply 1 drop to eye as needed. Apply to both eyes as needed    . rosuvastatin (CRESTOR) 5 MG tablet Take 1 tablet (5 mg total) by mouth daily. 90 tablet 2  . vitamin B-12 (CYANOCOBALAMIN) 1000 MCG tablet Take 1,000 mcg by mouth daily.     . vitamin E 400 UNIT capsule Take 400 Units by mouth daily.    Marland Kitchen zinc gluconate 50 MG tablet Take 50 mg by mouth daily.    Marland Kitchen levocetirizine (XYZAL) 5 MG tablet Take 1 tablet (5 mg total) by mouth  daily as needed for allergies. 30 tablet 5  . Olopatadine HCl (PATADAY) 0.2 % SOLN Place 1 drop into both eyes daily as needed. 2.5 mL 5   No current facility-administered medications for this visit.    Allergies  Allergen Reactions  . Bactrim [Sulfamethoxazole-Trimethoprim] Other (See Comments)  . Nitroglycerin Anaphylaxis    Hypotensive after NTG administration in ER during chest pain evaluation Pt states she almost died,  Pt states in a procedure done in 10/2016 she had no reaction to this medication, so not sure if it is an actual allergy.   . Pentosan Polysulfate Other (See Comments)    Elevated liver count Elevated liver enzymes Elevated LFTs.......Marland Kitchen elmiron   . Pentosan Polysulfate Sodium     Elevated LFTs.......Marland Kitchen elmiron   . Promethazine Hcl Other (See Comments)    "jittery on the inside"  . Sulfamethoxazole-Trimethoprim Nausea Only    Unknown reaction per pt, Pt thinks she may have felt sick to her stomach.   . Promethazine Hcl   . Lisinopril Cough   Review of systems: Review of systems negative except as noted in HPI /  PMHx.  Past Medical History:  Diagnosis Date  . Allergy    seasonal  . Ankylosing spondylitis ([place])   . Anxiety   . Arthritis   . Bell palsy   . CTS (carpal tunnel syndrome)   . Cystitis   . Diverticulitis   . Diverticulosis of colon   . Dry eye syndrome   . Exocrine pancreatic insufficiency   . Frozen shoulder   . GERD (gastroesophageal reflux disease)   . HLA B27 (HLA B27 positive)   . Hx of colonic polyps   . Hyperlipidemia    NMR 2005  . Hypertension   . Hypothyroidism   . Incontinence   . Internal hemorrhoids   . Interstitial cystitis   . Kidney stone   . Menopause   . Osteoarthritis   . Osteopenia    [facility] [place]   . S/P total hysterectomy and bilateral salpingo-oophorectomy 04/12/2014  . Sinusitis   . Sleep apnea    c-pap  . Subjective visual disturbance of both eyes 12/30/2013  . Tubular adenoma of colon   . Vitamin D deficiency   . Xerostomia    and Xeroophthalmia    Family History  Problem Relation Age of Onset  . Coronary artery disease Mother   . Hypertension Mother   . Asthma Mother   . Dementia Mother   . Stroke Mother        mini cva  . Diabetes Father   . Cirrhosis Father        non alcoholic  . Colon cancer Sister 66  . Allergic rhinitis Sister   . Allergic rhinitis Sister   . Food Allergy Sister   . Renal cancer Paternal Aunt        x 2  . Coronary artery disease Maternal Grandfather   . Breast cancer Maternal Aunt   . Diabetes Paternal Aunt   . Diabetes Paternal Grandmother   . Esophageal cancer Neg Hx   . Stomach cancer Neg Hx     Social History   Socioeconomic History  . Marital status: Married    Spouse name: Not on file  . Number of children: 1  . Years of education: BS  . Highest education level: Not on file  Occupational History  . Occupation: retired  . Occupation: Pharmacist, hospital    Comment:  Kindergarten  Tobacco Use  . Smoking status: Never Smoker  . Smokeless tobacco: Never Used   Substance and Sexual Activity  . Alcohol use: Yes    Comment: social  . Drug use: No  . Sexual activity: Not Currently  Other Topics Concern  . Not on file  Social History Narrative   Lives   Caffeine use:    Married      Social Determinants of Health   Financial Resource Strain:   . Difficulty of Paying Living Expenses: Not on file  Food Insecurity:   . Worried About Charity fundraiser in the Last Year: Not on file  . Ran Out of Food in the Last Year: Not on file  Transportation Needs:   . Lack of Transportation (Medical): Not on file  . Lack of Transportation (Non-Medical): Not on file  Physical Activity:   . Days of Exercise per Week: Not on file  . Minutes of Exercise per Session: Not on file  Stress:   . Feeling of Stress : Not on file  Social Connections:   . Frequency of Communication with Friends and Family: Not on file  . Frequency of Social Gatherings with Friends and Family: Not on file  . Attends Religious Services: Not on file  . Active Member of Clubs or Organizations: Not on file  . Attends Archivist Meetings: Not on file  . Marital Status: Not on file  Intimate Partner Violence:   . Fear of Current or Ex-Partner: Not on file  . Emotionally Abused: Not on file  . Physically Abused: Not on file  . Sexually Abused: Not on file    I appreciate the opportunity to take part in Elodie's care. Please do not hesitate to contact me with questions.  Sincerely,   R. Edgar Frisk, MD

## 2019-09-24 NOTE — Assessment & Plan Note (Signed)
   Continue aeroallergen avoidance measures.  A prescription has been provided for levocetirizine(Xyzal), 5 mg daily as needed.    To avoid diminishing benefit with daily use (tachyphylaxis) of second generation antihistamine, consider alternating every few months between fexofenadine (Allegra) and levocetirizine (Xyzal).  A prescription has been provided for azelastine/fluticasone nasal spray, 1 spray per nostril 2 times daily as needed. Proper nasal spray technique has been discussed and demonstrated.   Nasal saline spray (i.e., Simply Saline) or nasal saline lavage (i.e., NeilMed) is recommended as needed and prior to medicated nasal sprays.

## 2019-09-29 ENCOUNTER — Telehealth: Payer: Self-pay | Admitting: Cardiology

## 2019-09-29 NOTE — Telephone Encounter (Signed)
Patient calling with BP readings:  Feb. 25th  147/84   140/84 Today  144/87  141/89  Patient states she will be leaving house in about an hour so if she does not answer a returned call to try her cell number.

## 2019-09-29 NOTE — Telephone Encounter (Signed)
Called and spoke to patient. She states that she used to be on HCTZ but was switched to losartan 25 mg QD.  A couple of weeks ago she called in to report higher BP readings and her losartan was increased to 50 mg QD. Patient states that she had to stop taking the losartan all together 4 days ago. She states that she developed stomach pains, tingling sensation in her legs, insomnia, and was lightheaded. She states that she remembers feeling this way before when she was on losartan. She denies having any Sx at this time, but is wanting to know if she should go back on her HCTZ or something else. Will forward to Dr. Meda Coffee for review.   See BP readings below off of losartan:  2/25: 147/84 62          140/84 65  3/1: 144/87 67        141/89 63

## 2019-10-01 NOTE — Telephone Encounter (Signed)
PLease d/c losartan, start amlodipine 2.5 mg po daily, thank you

## 2019-10-02 MED ORDER — AMLODIPINE BESYLATE 2.5 MG PO TABS: 2.5000 mg | ORAL_TABLET | Freq: Every day | ORAL | 1 refills | Status: DC

## 2019-10-02 MED FILL — AMLODIPINE 2.5 MG TABLET: 2.5 | 30 days supply | Qty: 30 | Fill #0

## 2019-10-02 NOTE — Telephone Encounter (Signed)
Spoke with the pt and informed her that per Dr. Meda Coffee, we will discontinue her losartan, and start amlodipine 2.5 mg po daily. Updated losartan in her allergies as an intolerance with the following side effects pt mentioned.  Confirmed the pharmacy of choice with the pt.  Informed the pt that I will send in only a month supply of this med, to make sure she tolerates this appropriately. Pt verbalized understanding and agrees with this plan.

## 2019-10-03 MED FILL — MAGNESIUM OXIDE 400 MG TAB: 400 (240 MG | 240 days supply | Qty: 120 | Fill #1

## 2019-10-15 DIAGNOSIS — N958 Other specified menopausal and perimenopausal disorders: Secondary | ICD-10-CM | POA: Diagnosis not present

## 2019-10-15 DIAGNOSIS — N3946 Mixed incontinence: Secondary | ICD-10-CM | POA: Diagnosis not present

## 2019-10-15 MED FILL — ESTRADIOL 0.1 MG/GM CRM: 0.1 | 90 days supply | Qty: 43 | Fill #0

## 2019-10-27 ENCOUNTER — Other Ambulatory Visit: Payer: Self-pay | Admitting: Internal Medicine

## 2019-10-27 DIAGNOSIS — K219 Gastro-esophageal reflux disease without esophagitis: Secondary | ICD-10-CM

## 2019-10-27 MED FILL — AMLODIPINE 2.5 MG TABLET: 2.5 | 30 days supply | Qty: 30 | Fill #1

## 2019-10-27 MED FILL — OMEPRAZOLE 40 MG CPDR: 40 | 90 days supply | Qty: 90 | Fill #0

## 2019-10-29 DIAGNOSIS — G5601 Carpal tunnel syndrome, right upper limb: Secondary | ICD-10-CM | POA: Diagnosis not present

## 2019-11-05 DIAGNOSIS — Z79899 Other long term (current) drug therapy: Secondary | ICD-10-CM | POA: Diagnosis not present

## 2019-11-05 DIAGNOSIS — M79641 Pain in right hand: Secondary | ICD-10-CM | POA: Diagnosis not present

## 2019-11-05 DIAGNOSIS — M79642 Pain in left hand: Secondary | ICD-10-CM | POA: Diagnosis not present

## 2019-11-05 DIAGNOSIS — M545 Low back pain: Secondary | ICD-10-CM | POA: Diagnosis not present

## 2019-11-10 ENCOUNTER — Other Ambulatory Visit: Payer: Self-pay | Admitting: Cardiology

## 2019-11-10 DIAGNOSIS — I351 Nonrheumatic aortic (valve) insufficiency: Secondary | ICD-10-CM

## 2019-11-10 DIAGNOSIS — R102 Pelvic and perineal pain: Secondary | ICD-10-CM | POA: Diagnosis not present

## 2019-11-10 DIAGNOSIS — E785 Hyperlipidemia, unspecified: Secondary | ICD-10-CM

## 2019-11-10 DIAGNOSIS — I2510 Atherosclerotic heart disease of native coronary artery without angina pectoris: Secondary | ICD-10-CM

## 2019-11-10 DIAGNOSIS — M6289 Other specified disorders of muscle: Secondary | ICD-10-CM | POA: Diagnosis not present

## 2019-11-10 DIAGNOSIS — M6281 Muscle weakness (generalized): Secondary | ICD-10-CM | POA: Diagnosis not present

## 2019-11-10 DIAGNOSIS — I10 Essential (primary) hypertension: Secondary | ICD-10-CM

## 2019-11-10 DIAGNOSIS — N3946 Mixed incontinence: Secondary | ICD-10-CM | POA: Diagnosis not present

## 2019-11-10 DIAGNOSIS — Z1589 Genetic susceptibility to other disease: Secondary | ICD-10-CM

## 2019-11-10 MED FILL — ROSUVASTATIN CALCIUM 5 MG T: 5 | 90 days supply | Qty: 36 | Fill #0

## 2019-11-11 ENCOUNTER — Other Ambulatory Visit: Payer: Self-pay | Admitting: Cardiology

## 2019-11-19 ENCOUNTER — Telehealth: Payer: Self-pay | Admitting: Internal Medicine

## 2019-11-19 NOTE — Telephone Encounter (Signed)
Pt states her daughter saw Dr. Hilarie Fredrickson the other day and told him about her mothers symptoms and told her mother to call out office for an appt due to her symptoms. States she is having lower abd pain and cramping and also having BRBPR. Pt scheduled to see Dr. Hilarie Fredrickson tomorrow at 11am. Pt aware of appt.

## 2019-11-20 ENCOUNTER — Telehealth: Payer: Self-pay | Admitting: Internal Medicine

## 2019-11-20 ENCOUNTER — Other Ambulatory Visit (INDEPENDENT_AMBULATORY_CARE_PROVIDER_SITE_OTHER): Payer: Medicare Other

## 2019-11-20 ENCOUNTER — Encounter: Payer: Self-pay | Admitting: Internal Medicine

## 2019-11-20 ENCOUNTER — Ambulatory Visit (INDEPENDENT_AMBULATORY_CARE_PROVIDER_SITE_OTHER): Payer: Medicare Other | Admitting: Internal Medicine

## 2019-11-20 VITALS — BP 110/80 | HR 72 | Temp 98.7°F | Ht 62.0 in | Wt 154.0 lb

## 2019-11-20 DIAGNOSIS — K589 Irritable bowel syndrome without diarrhea: Secondary | ICD-10-CM | POA: Diagnosis not present

## 2019-11-20 DIAGNOSIS — R1084 Generalized abdominal pain: Secondary | ICD-10-CM

## 2019-11-20 DIAGNOSIS — I2510 Atherosclerotic heart disease of native coronary artery without angina pectoris: Secondary | ICD-10-CM

## 2019-11-20 DIAGNOSIS — K219 Gastro-esophageal reflux disease without esophagitis: Secondary | ICD-10-CM

## 2019-11-20 DIAGNOSIS — K8681 Exocrine pancreatic insufficiency: Secondary | ICD-10-CM | POA: Diagnosis not present

## 2019-11-20 LAB — CBC WITH DIFFERENTIAL/PLATELET
Basophils Absolute: 0 10*3/uL (ref 0.0–0.1)
Basophils Relative: 0.5 % (ref 0.0–3.0)
Eosinophils Absolute: 0.2 10*3/uL (ref 0.0–0.7)
Eosinophils Relative: 2.9 % (ref 0.0–5.0)
HCT: 36 % (ref 36.0–46.0)
Hemoglobin: 12.1 g/dL (ref 12.0–15.0)
Lymphocytes Relative: 22 % (ref 12.0–46.0)
Lymphs Abs: 1.7 10*3/uL (ref 0.7–4.0)
MCHC: 33.6 g/dL (ref 30.0–36.0)
MCV: 88.8 fl (ref 78.0–100.0)
Monocytes Absolute: 0.6 10*3/uL (ref 0.1–1.0)
Monocytes Relative: 7.2 % (ref 3.0–12.0)
Neutro Abs: 5.3 10*3/uL (ref 1.4–7.7)
Neutrophils Relative %: 67.4 % (ref 43.0–77.0)
Platelets: 242 10*3/uL (ref 150.0–400.0)
RBC: 4.05 Mil/uL (ref 3.87–5.11)
RDW: 13 % (ref 11.5–15.5)
WBC: 7.9 10*3/uL (ref 4.0–10.5)

## 2019-11-20 LAB — COMPREHENSIVE METABOLIC PANEL
ALT: 20 U/L (ref 0–35)
AST: 23 U/L (ref 0–37)
Albumin: 4.5 g/dL (ref 3.5–5.2)
Alkaline Phosphatase: 102 U/L (ref 39–117)
BUN: 17 mg/dL (ref 6–23)
CO2: 29 mEq/L (ref 19–32)
Calcium: 9.8 mg/dL (ref 8.4–10.5)
Chloride: 105 mEq/L (ref 96–112)
Creatinine, Ser: 1.01 mg/dL (ref 0.40–1.20)
GFR: 53.32 mL/min — ABNORMAL LOW (ref >60.00)
Glucose, Bld: 100 mg/dL — ABNORMAL HIGH (ref 70–99)
Potassium: 4.4 mEq/L (ref 3.5–5.1)
Sodium: 141 mEq/L (ref 135–145)
Total Bilirubin: 0.5 mg/dL (ref 0.2–1.2)
Total Protein: 7.3 g/dL (ref 6.0–8.3)

## 2019-11-20 MED ORDER — PANCRELIPASE (LIP-PROT-AMYL) 36000-114000 UNITS PO CPEP: ORAL_CAPSULE | ORAL | 2 refills | Status: DC

## 2019-11-20 MED ORDER — PANCRELIPASE (LIP-PROT-AMYL) 36000-114000 UNITS PO CPEP: ORAL_CAPSULE | ORAL | 1 refills | Status: DC

## 2019-11-20 NOTE — Progress Notes (Signed)
Subjective:    Patient ID: [name], female    DOB: 09/03/1945, 74 y.o.   MRN: [id number]  HPI [name] is a 74 year old female with a past medical history of IBS overlapping with exocrine pancreatic insufficiency, history of colon polyps, history of diverticulosis, GERD, family history of colon cancer who is here for follow-up.  She is here alone today and was last seen on 09/17/2019.  She also has a past medical history of RLS, osteoporosis, sleep apnea, arthritis, hyperlipidemia, diabetes and pelvic floor dysfunction.  She reports that she did follow-up with Dr. Zigmund Daniel at [facility].  She this visit include being seen by what sounds like a urogynecologic fellow.  She wanted to pursue recurrent bladder surgery to deal with a frequent urinary leakage.  However pelvic floor physical therapy was recommended before further intervention.  She has started this process but states the physical therapist says there may be nerve damage which would make physical therapy less likely to be successful.  From a GI perspective she has been feeling less well.  She is dealing with sharp and episodic epigastric discomfort.  This can be stabbing in nature at times.  It then seems to pass to the mid and eventually lower abdomen.  At times it feels like a "intense gas issue".  At times it makes her want to double over.  She feels the need to have loose stools or diarrhea but cannot.  Stools have been mostly formed but she does have to press on the perineum to help pass stool.  On an occasion recently she saw bright red blood with wiping and a few drips in the toilet.  She is having occasional nausea and will occasionally use Zofran.  No diarrhea.  Her appetite seems to come and go a bit and she is trying to drink more water.  She does continue omeprazole daily.  She has been off of Creon.   Review of Systems As per HPI, otherwise negative  Current Medications, Allergies, Past Medical History, Past Surgical  History, Family History and Social History were reviewed in [employer] record.     Objective:   Physical Exam BP 110/80   Pulse 72   Temp 98.7 F (37.1 C)   Ht 5\' 2"  (1.575 m)   Wt 154 lb (69.9 kg)   BMI 28.17 kg/m  Gen: awake, alert, NAD HEENT: anicteric CV: RRR, no mrg Pulm: CTA b/l Abd: soft, lower abdominal tenderness with palpation above the xiphoid and in the left lower quadrant, ND, +BS throughout Ext: no c/c/e Neuro: nonfocal      Assessment & Plan:  74 year old female with a past medical history of IBS overlapping with exocrine pancreatic insufficiency, history of colon polyps, history of diverticulosis, GERD, family history of colon cancer who is here for follow-up.   1.  IBS with history of pancreatic insufficiency/abdominal pain both upper and lower --pain could be secondary to untreated exocrine pancreatic insufficiency and maldigestion, flare of IBS but I would like to perform cross-sectional imaging to exclude diverticulitis.  We discussed this evaluation today and will proceed as follows --CBC, CMP --CT scan of the abdomen pelvis with IV contrast --I asked that she resume Creon 36,000 units 2 capsules with meals, 1 with snacks  2.  History of GERD with globus --this symptom is well treated and GERD is under control with omeprazole, continue 40 mg daily  3.  History of colon polyps and family history of colon cancer --surveillance  colonoscopy is recommended in July 2024  4.  Urinary incontinence/pelvic floor dysfunction --she is followed up with Dr. Zigmund Daniel and is being treated with pelvic floor physical therapy  30 minutes total spent today including patient facing time, coordination of care, reviewing medical history/procedures/pertinent radiology studies, and documentation of the encounter.

## 2019-11-20 NOTE — Patient Instructions (Signed)
Continue omeprazole.  Resume your creon 36,000 units, 2 capsules with each meal, 1 capsule with each snack.  Your provider has requested that you go to the basement level for lab work before leaving today. Press "B" on the elevator. The lab is located at the first door on the left as you exit the elevator.  You have been scheduled for a CT scan of the abdomen and pelvis at [facility].   You are scheduled on Thursday, 11/27/19 at 1:30 pm. You should arrive 15 minutes prior to your appointment time for registration. Please follow the written instructions below on the day of your exam:  WARNING: IF YOU ARE ALLERGIC TO IODINE/X-RAY DYE, PLEASE NOTIFY RADIOLOGY IMMEDIATELY AT [phone]! YOU WILL BE GIVEN A 13 HOUR PREMEDICATION PREP.  1) Do not eat or drink anything after 9:30 am (4 hours prior to your test) 2) You have been given 2 bottles of oral contrast to drink. The solution may taste better if refrigerated, but do NOT add ice or any other liquid to this solution. Shake well before drinking.    Drink 1 bottle of contrast @ 11:30 am (2 hours prior to your exam)  Drink 1 bottle of contrast @ 12:30 pm (1 hour prior to your exam)  You may take any medications as prescribed with a small amount of water, if necessary. If you take any of the following medications: METFORMIN, GLUCOPHAGE, GLUCOVANCE, AVANDAMET, RIOMET, FORTAMET, [place] MET, JANUMET, GLUMETZA or METAGLIP, you MAY be asked to HOLD this medication 48 hours AFTER the exam.  The purpose of you drinking the oral contrast is to aid in the visualization of your intestinal tract. The contrast solution may cause some diarrhea. Depending on your individual set of symptoms, you may also receive an intravenous injection of x-ray contrast/dye. Plan on being at [facility] for 30 minutes or longer, depending on the type of exam you are having performed.  This test typically takes 30-45 minutes to complete.  If you have any questions  regarding your exam or if you need to reschedule, you may call the CT department at [phone] between the hours of 8:00 am and 5:00 pm, Monday-Friday.  ________________________________________________________________  If you are age 76 or older, your body mass index should be between 23-30. Your Body mass index is 28.17 kg/m. If this is out of the aforementioned range listed, please consider follow up with your Primary Care Provider.  If you are age 22 or younger, your body mass index should be between 19-25. Your Body mass index is 28.17 kg/m. If this is out of the aformentioned range listed, please consider follow up with your Primary Care Provider.   Due to recent changes in healthcare laws, you may see the results of your imaging and laboratory studies on MyChart before your provider has had a chance to review them.  We understand that in some cases there may be results that are confusing or concerning to you. Not all laboratory results come back in the same time frame and the provider may be waiting for multiple results in order to interpret others.  Please give [place] 48 hours in order for your provider to thoroughly review all the results before contacting the office for clarification of your results.

## 2019-11-20 NOTE — Telephone Encounter (Signed)
Patient called states she would [facility]

## 2019-11-20 NOTE — Telephone Encounter (Signed)
Rx sent 

## 2019-11-21 ENCOUNTER — Ambulatory Visit: Payer: Medicare Other | Admitting: Internal Medicine

## 2019-11-21 DIAGNOSIS — Z79899 Other long term (current) drug therapy: Secondary | ICD-10-CM | POA: Diagnosis not present

## 2019-11-21 DIAGNOSIS — M47816 Spondylosis without myelopathy or radiculopathy, lumbar region: Secondary | ICD-10-CM | POA: Diagnosis not present

## 2019-11-21 DIAGNOSIS — M8448XA Pathological fracture, other site, initial encounter for fracture: Secondary | ICD-10-CM | POA: Diagnosis not present

## 2019-11-21 DIAGNOSIS — M79643 Pain in unspecified hand: Secondary | ICD-10-CM | POA: Diagnosis not present

## 2019-11-21 DIAGNOSIS — G894 Chronic pain syndrome: Secondary | ICD-10-CM | POA: Diagnosis not present

## 2019-11-21 DIAGNOSIS — M8589 Other specified disorders of bone density and structure, multiple sites: Secondary | ICD-10-CM | POA: Diagnosis not present

## 2019-11-21 DIAGNOSIS — M545 Low back pain: Secondary | ICD-10-CM | POA: Diagnosis not present

## 2019-11-21 DIAGNOSIS — M25552 Pain in left hip: Secondary | ICD-10-CM | POA: Diagnosis not present

## 2019-11-21 DIAGNOSIS — M160 Bilateral primary osteoarthritis of hip: Secondary | ICD-10-CM | POA: Diagnosis not present

## 2019-11-21 DIAGNOSIS — M25559 Pain in unspecified hip: Secondary | ICD-10-CM | POA: Diagnosis not present

## 2019-11-21 DIAGNOSIS — M25551 Pain in right hip: Secondary | ICD-10-CM | POA: Diagnosis not present

## 2019-11-21 DIAGNOSIS — Z5181 Encounter for therapeutic drug level monitoring: Secondary | ICD-10-CM | POA: Diagnosis not present

## 2019-11-21 DIAGNOSIS — M419 Scoliosis, unspecified: Secondary | ICD-10-CM | POA: Diagnosis not present

## 2019-11-21 DIAGNOSIS — M8580 Other specified disorders of bone density and structure, unspecified site: Secondary | ICD-10-CM | POA: Diagnosis not present

## 2019-11-21 DIAGNOSIS — M48061 Spinal stenosis, lumbar region without neurogenic claudication: Secondary | ICD-10-CM | POA: Diagnosis not present

## 2019-11-21 DIAGNOSIS — M461 Sacroiliitis, not elsewhere classified: Secondary | ICD-10-CM | POA: Diagnosis not present

## 2019-11-21 DIAGNOSIS — Z79891 Long term (current) use of opiate analgesic: Secondary | ICD-10-CM | POA: Diagnosis not present

## 2019-11-21 DIAGNOSIS — M79673 Pain in unspecified foot: Secondary | ICD-10-CM | POA: Diagnosis not present

## 2019-11-21 MED FILL — diazePAM 5 MG TABS: 5 | 2 days supply | Qty: 2 | Fill #0

## 2019-11-21 MED FILL — HYDROCODON-APAP 5-325: 5-325 | 7 days supply | Qty: 21 | Fill #0

## 2019-11-21 MED FILL — NARCAN 4 MG NASAL SPRAY: 4 | 1 days supply | Qty: 2 | Fill #0

## 2019-11-24 DIAGNOSIS — N3946 Mixed incontinence: Secondary | ICD-10-CM | POA: Diagnosis not present

## 2019-11-24 DIAGNOSIS — M6281 Muscle weakness (generalized): Secondary | ICD-10-CM | POA: Diagnosis not present

## 2019-11-24 DIAGNOSIS — R102 Pelvic and perineal pain: Secondary | ICD-10-CM | POA: Diagnosis not present

## 2019-11-24 DIAGNOSIS — M6289 Other specified disorders of muscle: Secondary | ICD-10-CM | POA: Diagnosis not present

## 2019-11-27 ENCOUNTER — Ambulatory Visit (HOSPITAL_COMMUNITY)
Admission: RE | Admit: 2019-11-27 | Discharge: 2019-11-27 | Disposition: A | Discharge status: Home / Self Care | Payer: Medicare Other | Source: Ambulatory Visit | Attending: Internal Medicine | Admitting: Internal Medicine

## 2019-11-27 ENCOUNTER — Encounter (HOSPITAL_COMMUNITY): Payer: Self-pay

## 2019-11-27 ENCOUNTER — Other Ambulatory Visit: Payer: Self-pay

## 2019-11-27 DIAGNOSIS — R1084 Generalized abdominal pain: Secondary | ICD-10-CM | POA: Diagnosis not present

## 2019-11-27 DIAGNOSIS — R1110 Vomiting, unspecified: Secondary | ICD-10-CM | POA: Diagnosis not present

## 2019-11-27 DIAGNOSIS — R112 Nausea with vomiting, unspecified: Secondary | ICD-10-CM | POA: Diagnosis not present

## 2019-11-27 MED ORDER — SODIUM CHLORIDE (PF) 0.9 % IJ SOLN
INTRAMUSCULAR | Status: AC
  Filled 2019-11-27: qty 50

## 2019-11-27 MED ORDER — IOHEXOL 300 MG/ML  SOLN
100.0000 mL | Freq: Once | INTRAMUSCULAR | Status: AC | PRN
  Administered 2019-11-27: 14:00:00 100 mL via INTRAVENOUS

## 2019-11-29 DIAGNOSIS — M47816 Spondylosis without myelopathy or radiculopathy, lumbar region: Secondary | ICD-10-CM | POA: Diagnosis not present

## 2019-11-29 DIAGNOSIS — M1909 Primary osteoarthritis, other specified site: Secondary | ICD-10-CM | POA: Diagnosis not present

## 2019-11-29 DIAGNOSIS — M5126 Other intervertebral disc displacement, lumbar region: Secondary | ICD-10-CM | POA: Diagnosis not present

## 2019-11-29 DIAGNOSIS — M5127 Other intervertebral disc displacement, lumbosacral region: Secondary | ICD-10-CM | POA: Diagnosis not present

## 2019-11-29 DIAGNOSIS — M461 Sacroiliitis, not elsewhere classified: Secondary | ICD-10-CM | POA: Diagnosis not present

## 2019-11-29 DIAGNOSIS — M48061 Spinal stenosis, lumbar region without neurogenic claudication: Secondary | ICD-10-CM | POA: Diagnosis not present

## 2019-11-29 DIAGNOSIS — M549 Dorsalgia, unspecified: Secondary | ICD-10-CM | POA: Diagnosis not present

## 2019-12-02 DIAGNOSIS — M159 Polyosteoarthritis, unspecified: Secondary | ICD-10-CM | POA: Diagnosis not present

## 2019-12-02 DIAGNOSIS — M25559 Pain in unspecified hip: Secondary | ICD-10-CM | POA: Diagnosis not present

## 2019-12-02 DIAGNOSIS — M461 Sacroiliitis, not elsewhere classified: Secondary | ICD-10-CM | POA: Diagnosis not present

## 2019-12-02 DIAGNOSIS — M79643 Pain in unspecified hand: Secondary | ICD-10-CM | POA: Diagnosis not present

## 2019-12-02 DIAGNOSIS — M48061 Spinal stenosis, lumbar region without neurogenic claudication: Secondary | ICD-10-CM | POA: Diagnosis not present

## 2019-12-02 DIAGNOSIS — G894 Chronic pain syndrome: Secondary | ICD-10-CM | POA: Diagnosis not present

## 2019-12-02 DIAGNOSIS — M79673 Pain in unspecified foot: Secondary | ICD-10-CM | POA: Diagnosis not present

## 2019-12-02 DIAGNOSIS — M810 Age-related osteoporosis without current pathological fracture: Secondary | ICD-10-CM | POA: Diagnosis not present

## 2019-12-02 DIAGNOSIS — M25519 Pain in unspecified shoulder: Secondary | ICD-10-CM | POA: Diagnosis not present

## 2019-12-04 ENCOUNTER — Ambulatory Visit (INDEPENDENT_AMBULATORY_CARE_PROVIDER_SITE_OTHER): Payer: Medicare Other

## 2019-12-04 ENCOUNTER — Other Ambulatory Visit: Payer: Self-pay

## 2019-12-04 VITALS — BP 118/80 | HR 70 | Temp 98.3°F | Resp 16 | Ht 62.0 in | Wt 153.7 lb

## 2019-12-04 DIAGNOSIS — Z0000 Encounter for general adult medical examination without abnormal findings: Secondary | ICD-10-CM | POA: Diagnosis not present

## 2019-12-04 NOTE — Progress Notes (Signed)
Subjective:   [name] is a 74 y.o. female who presents for Medicare Annual (Subsequent) preventive examination.  Review of Systems:  No ROS. Medicare Wellness Visit. Additional risk factors are reflected in social history. Cardiac Risk Factors include: advanced age (>9mn, >>78women);dyslipidemia;hypertension  Sleep Patterns: No sleep issues, feels rested on waking and sleeps 8 hours nightly. Home Safety/Smoke Alarms: Feels safe in home; uses home alarm. Smoke alarms in place. Living environment: Lives in a 2-story home with husband; no needs for DME, good family support system. Seat Belt Safety/Bike Helmet: Wears seat belt.    Objective:     Vitals: BP 118/80 (BP Location: Left Arm, Patient Position: Sitting, Cuff Size: Normal)   Pulse 70   Temp 98.3 F (36.8 C)   Resp 16   Ht 5' 2"  (1.575 m)   Wt 153 lb 11.2 oz (69.7 kg)   SpO2 98%   BMI 28.11 kg/m   Body mass index is 28.11 kg/m.  Advanced Directives 12/04/2019 02/18/2019 02/10/2019 11/29/2018 05/17/2018 04/24/2018 01/23/2018  Does Patient Have a Medical Advance Directive? Yes No No No No No No  Type of [place] - - - - - -  Does patient want to make changes to medical advance directive? No - Patient declined - - - - - -  Copy of [place] Chart? No - copy requested - - - - - -  Would patient like information on creating a medical advance directive? - No - Patient declined No - Patient declined No - Patient declined - - No - Patient declined    Tobacco Social History   Tobacco Use  Smoking Status Never Smoker  Smokeless Tobacco Never Used     Counseling given: No   Clinical Intake:  Pre-visit preparation completed: Yes  Pain : 0-10 Pain Score: 2  Pain Type: Chronic pain Pain Location: Shoulder Pain Orientation: Left Pain Descriptors / Indicators: Constant Pain Onset: More than a month ago Pain Frequency: Constant     BMI -  recorded: 28.1 Nutritional Status: BMI 25 -29 Overweight Nutritional Risks: None Diabetes: No(prediabetes)  How often do you need to have someone help you when you read instructions, pamphlets, or other written materials from your doctor or pharmacy?: 1 - Never What is the last grade level you completed in school?: BS in Elementary Education  Interpreter Needed?: No  Comments: 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 Allergy    seasonal  . Ankylosing spondylitis ([place]   . Anxiety   . Arthritis   . Bell palsy   . CTS (carpal tunnel syndrome)   . Cystitis   . Diverticulitis   . Diverticulosis of colon   . Dry eye syndrome   . Exocrine pancreatic insufficiency   . Frozen shoulder   . GERD (gastroesophageal reflux disease)   . HLA B27 (HLA B27 positive)   . Hx of colonic polyps   . Hyperlipidemia    NMR 2005  . Hypertension   . Hypothyroidism   . Incontinence   . Internal hemorrhoids   . Interstitial cystitis   . Kidney stone   . Menopause   . Osteoarthritis   . Osteopenia    [place] [place]  . S/P total hysterectomy and bilateral salpingo-oophorectomy 04/12/2014  . Sinusitis   . Sleep apnea  c-pap  . Subjective visual disturbance of both eyes 12/30/2013  . Tubular adenoma of colon   . Vitamin D deficiency   . Xerostomia    and Xeroophthalmia   Past Surgical History:  Procedure Laterality Date  . BLADDER SURGERY     for incontinence  . CARPAL TUNNEL RELEASE Left 12/22/2014   Procedure: LEFT CARPAL TUNNEL RELEASE;  Surgeon: Daryll Brod, MD;  Location: [facility];  Service: Orthopedics;  Laterality: Left;  . CARPAL TUNNEL RELEASE Right 02/18/2019   Procedure: RIGHT CARPAL TUNNEL RELEASE;  Surgeon: Daryll Brod, MD;  Location: [facility];  Service: Orthopedics;  Laterality: Right;  . COLONOSCOPY W/ POLYPECTOMY      adenomatous poyp 04-2008  . colonscopy     tics 10-2002  . CYSTOSCOPY WITH RETROGRADE PYELOGRAM, URETEROSCOPY AND STENT PLACEMENT Left 09/01/2013   Procedure: CYSTOSCOPY WITH RETROGRADE PYELOGRAM, AND LEFT STENT PLACEMENT;  Surgeon: Molli Hazard, MD;  Location: [facility];  Service: Urology;  Laterality: Left;  . elevated LFTs     due to [place]  . FINGER SURGERY Left 2004   2nd finger  . G 1 P 1    . interstitial cystitis     with clot in catheter  . PARTIAL HYSTERECTOMY     with one ovary left  . SHOULDER ARTHROSCOPY Left   . TOTAL ABDOMINAL HYSTERECTOMY W/ BILATERAL SALPINGOOPHORECTOMY     dysfunctional menses   Family History  Problem Relation Age of Onset  . Coronary artery disease Mother   . Hypertension Mother   . Asthma Mother   . Dementia Mother   . Stroke Mother        mini cva  . Diabetes Father   . Cirrhosis Father        non alcoholic  . Colon cancer Sister 67  . Allergic rhinitis Sister   . Allergic rhinitis Sister   . Food Allergy Sister   . Renal cancer Paternal Aunt        x 2  . Coronary artery disease Maternal Grandfather   . Breast cancer Maternal Aunt   . Diabetes Paternal Aunt   . Diabetes Paternal Grandmother   . Esophageal cancer Neg Hx   . Stomach cancer Neg Hx    Social History   Socioeconomic History  . Marital status: Married    Spouse name: Not on file  . Number of children: 1  . Years of education: BS  . Highest education level: Not on file  Occupational History  . Occupation: retired  . Occupation: Pharmacist, hospital    Comment: Kindergarten  Tobacco Use  . Smoking status: Never Smoker  . Smokeless tobacco: Never Used  Substance and Sexual Activity  . Alcohol use: Yes    Comment: social  . Drug use: No  . Sexual activity: Not Currently  Other Topics Concern  . Not on file  Social History Narrative   Lives   Caffeine use:    Married      Social Determinants of Health   Financial Resource Strain:   . Difficulty of Paying  Living Expenses:   Food Insecurity:   . Worried About Charity fundraiser in the Last Year:   . Arboriculturist in the Last Year:   Transportation Needs:   . Film/video editor (Medical):   Marland Kitchen Lack of Transportation (Non-Medical):   Physical Activity:   . Days of Exercise per Week:   . Minutes of Exercise per Session:  Stress:   . Feeling of Stress :   Social Connections:   . Frequency of Communication with Friends and Family:   . Frequency of Social Gatherings with Friends and Family:   . Attends Religious Services:   . Active Member of Clubs or Organizations:   . Attends Archivist Meetings:   Marland Kitchen Marital Status:     Outpatient Encounter Medications as of 12/04/2019  Medication Sig  . acetaminophen (TYLENOL) 500 MG tablet Take 1,000 mg by mouth 3 (three) times daily as needed for mild pain.   . ACETAMINOPHEN PO acetaminophen  . amLODipine (NORVASC) 2.5 MG tablet Take 1 tablet (2.5 mg total) by mouth daily.  . Ascorbic Acid (VITAMIN C) 1000 MG tablet Take 2,000 mg by mouth daily.   Marland Kitchen aspirin 81 MG tablet Take 81 mg by mouth daily.    Marland Kitchen azelastine (ASTELIN) 0.1 % nasal spray One to two sprays each nostril twice a day as needed.  . blood glucose meter kit and supplies KIT Dispense based on patient and insurance preference. Use up to four times daily as directed. prediabetes  . cetirizine (ZYRTEC) 10 MG tablet Take 10 mg by mouth daily.  . Cholecalciferol (VITAMIN D-3) 25 MCG (1000 UT) CAPS Take 10,000 Units by mouth daily.   . diclofenac Sodium (VOLTAREN) 1 % GEL Apply topically 4 (four) times daily.  Marland Kitchen dicyclomine (BENTYL) 10 MG capsule Take 1 capsule (10 mg total) by mouth 3 (three) times daily as needed for spasms.  . diphenoxylate-atropine (LOMOTIL) 2.5-0.025 MG tablet Take 1 tablet by mouth 4 (four) times daily as needed for diarrhea or loose stools.  Marland Kitchen EPINEPHrine (EPIPEN 2-PAK) 0.3 mg/0.3 mL IJ SOAJ injection Use as directed for severe allergic reactions  .  levocetirizine (XYZAL) 5 MG tablet Take 1 tablet (5 mg total) by mouth daily as needed for allergies.  Marland Kitchen lipase/protease/amylase (CREON) 36000 UNITS CPEP capsule Take 2 capsules by mouth with each meal, 1 capsule by mouth with each snack  . Magnesium 200 MG TABS Take 1 tablet (200 mg total) by mouth daily.  . Multiple Vitamins-Minerals (CENTRUM SILVER 50+WOMEN PO) Take 1 tablet by mouth daily.  Marland Kitchen olopatadine (PATANOL) 0.1 % ophthalmic solution Place 1 drop into both eyes 2 (two) times daily.  . Olopatadine HCl (PATADAY) 0.2 % SOLN Place 1 drop into both eyes daily as needed.  . Omega-3 Fatty Acids (FISH OIL BURP-LESS) 1000 MG CAPS Takes 1000 mg BID  . omeprazole (PRILOSEC) 40 MG capsule TAKE 1 CAPSULE BY MOUTH ONCE DAILY  . ondansetron (ZOFRAN) 4 MG tablet Take 1 tablet (4 mg total) by mouth every 8 (eight) hours as needed.  Vladimir Faster Glycol-Propyl Glycol (SYSTANE OP) Apply 1 drop to eye as needed. Apply to both eyes as needed  . rosuvastatin (CRESTOR) 5 MG tablet TAKE 1 TABLET BY MOUTH ONCE DAILY  . vitamin B-12 (CYANOCOBALAMIN) 1000 MCG tablet Take 1,000 mcg by mouth daily.   . vitamin E 400 UNIT capsule Take 400 Units by mouth daily.  Marland Kitchen zinc gluconate 50 MG tablet Take 50 mg by mouth daily.  Marland Kitchen PAZEO 0.7 % SOLN PLACE 1 DROP INTO BOTH EYES DAILY. (Patient not taking: Reported on 12/04/2019)   No facility-administered encounter medications on file as of 12/04/2019.    Activities of Daily Living In your present state of health, do you have any difficulty performing the following activities: 12/04/2019 02/18/2019  Hearing? N N  Vision? N N  Difficulty concentrating or making decisions?  N N  Walking or climbing stairs? N N  Dressing or bathing? N N  Doing errands, shopping? N -  Preparing Food and eating ? N -  Using the Toilet? N -  In the past six months, have you accidently leaked urine? Y -  Comment wears a pad -  Do you have problems with loss of bowel control? N -  Managing your  Medications? N -  Managing your Finances? N -  Housekeeping or managing your Housekeeping? N -  Some recent data might be hidden    Patient Care Team: Binnie Rail, MD as PCP - General (Internal Medicine) Dorothy Spark, MD as PCP - Cardiology (Cardiology) Pyrtle, Lajuan Lines, MD as Consulting Physician (Gastroenterology) Lyndal Pulley, DO as Consulting Physician (Family Medicine) Rutherford Guys, MD as Consulting Physician (Ophthalmology)    Assessment:   This is a routine wellness examination for [place].  Exercise Activities and Dietary recommendations Current Exercise Habits: The patient does not participate in regular exercise at present, Exercise limited by: orthopedic condition(s)(left shoulder pain)  Goals    . Client understands the importance of follow-up with providers by attending scheduled visits    . Patient Stated     Increase my physical activity by walking more and exercise at home. Eat healthier by monitoring cholesterol, sugar and carbohydrates. Take a few minutes daily to do something just for me.    . Patient Stated     To walk more and get a physical trainer to help with left shoulder.       Fall Risk Fall Risk  12/04/2019 11/29/2018 11/23/2017 05/02/2017 06/02/2015  Falls in the past year? 0 0 No No No  Number falls in past yr: 0 0 - - -  Injury with Fall? 0 - - - -  Risk for fall due to : No Fall Risks - - - -  Follow up Falls evaluation completed;Education provided - - - -   Is the patient's home free of loose throw rugs in walkways, pet beds, electrical cords, etc?   yes      Grab bars in the bathroom? yes      Handrails on the stairs?   yes      Adequate lighting?   yes  Depression Screen PHQ 2/9 Scores 12/04/2019 11/29/2018 11/23/2017 05/02/2017  PHQ - 2 Score 0 2 2 0  PHQ- 9 Score - 2 3 -             Immunization History  Administered Date(s) Administered  . Fluad Quad(high Dose 65+) 04/02/2019  . Influenza Split 05/09/2011, 05/07/2012  .  Influenza Whole 08/01/2003, 05/29/2007, 04/15/2008, 04/30/2009, 05/03/2010  . Influenza, High Dose Seasonal PF 05/01/2013, 04/18/2014, 04/13/2016, 05/02/2017, 04/15/2018  . Influenza,inj,Quad PF,6+ Mos 05/21/2015  . PFIZER SARS-COV-2 Vaccination 09/13/2019, 10/08/2019  . Pneumococcal Conjugate-13 02/25/2015  . Pneumococcal Polysaccharide-23 07/31/2005, 05/27/2012  . Td 06/27/2010  . Zoster 07/19/2012  . Zoster Recombinat (Shingrix) 06/15/2017, 08/20/2017    Qualifies for Shingles Vaccine? completed  Screening Tests Health Maintenance  Topic Date Due  . FOOT EXAM  11/02/2018  . URINE MICROALBUMIN  11/02/2018  . OPHTHALMOLOGY EXAM  05/14/2019  . HEMOGLOBIN A1C  06/26/2019  . INFLUENZA VACCINE  02/29/2020  . DEXA SCAN  04/16/2020  . TETANUS/TDAP  06/27/2020  . COLONOSCOPY  01/24/2023  . COVID-19 Vaccine  Completed  . Hepatitis C Screening  Completed  . PNA vac Low Risk Adult  Completed    Cancer Screenings: Lung:  Low Dose CT Chest recommended if Age 37-80 years, 30 pack-year currently smoking OR have quit w/in 15years. Patient does not qualify. Breast:  Up to date on Mammogram? Yes   Up to date of Bone Density/Dexa? Yes Colorectal: Yes     Plan:     Reviewed health maintenance screenings with patient today and relevant education, vaccines, and/or referrals were provided.    Continue doing brain stimulating activities (puzzles, reading, adult coloring books, staying active) to keep memory sharp.    Continue to eat heart healthy diet (full of fruits, vegetables, whole grains, lean protein, water--limit salt, fat, and sugar intake) and increase physical activity as tolerated.  I have personally reviewed and noted the following in the patient's chart:   . Medical and social history . Use of alcohol, tobacco or illicit drugs  . Current medications and supplements . Functional ability and status . Nutritional status . Physical activity . Advanced directives . List of other  physicians . Hospitalizations, surgeries, and ER visits in previous 12 months . Vitals . Screenings to include cognitive, depression, and falls . Referrals and appointments  In addition, I have reviewed and discussed with patient certain preventive protocols, quality metrics, and best practice recommendations. A written personalized care plan for preventive services as well as general preventive health recommendations were provided to patient.     Sheral Flow, LPN  [id number]/[id number]  Nurse Health Advisor

## 2019-12-04 NOTE — Patient Instructions (Addendum)
[name] , Thank you for taking time to come for your Medicare Wellness Visit. I appreciate your ongoing commitment to your health goals. Please review the following plan we discussed and let me know if I can assist you in the future.   Screening recommendations/referrals: Colorectal Screening: due 2024 Mammogram: 10/18/2017 Bone Density: 04/16/2018  Vision and Dental Exams: Recommended annual ophthalmology exams for early detection of glaucoma and other disorders of the eye Recommended annual dental exams for proper oral hygiene  Diabetic Exams: Diabetic Eye Exam: 05/13/2018 Diabetic Foot Exam: 11/01/2017  Vaccinations: Influenza vaccine: 04/02/2019 Pneumococcal vaccine: completed; Pneumovax 05/27/2012, Prevnar 02/25/2015 Tdap vaccine: 06/27/2010;  Shingrix vaccine: completed; 06/15/2017, 08/20/2017 Covid vaccine: completed; [place] 09/13/2019, 10/08/2019  Advanced directives: Advance directives discussed with you today. Please bring a copy of your POA (Power of [name]) and/or Living Will to your next appointment.  Goals:  Recommend to drink at least 6-8 8oz glasses of water per day.  Recommend to exercise for at least 150 minutes per week.  Recommend to remove any items from the home that may cause slips or trips.  Recommend to decrease portion sizes by eating 3 small healthy meals and at least 2 healthy snacks per day.  Recommend to begin DASH diet as directed below  Recommend to continue efforts to reduce smoking habits until no longer smoking. Smoking Cessation literature is attached below.  Next appointment: Please schedule your Annual Wellness Visit with your Nurse Health Advisor in one year.  Preventive Care 67 Years and Older, Female Preventive care refers to lifestyle choices and visits with your health care provider that can promote health and wellness. What does preventive care include?  A yearly physical exam. This is also called an annual well check.  Dental exams  once or twice a year.  Routine eye exams. Ask your health care provider how often you should have your eyes checked.  Personal lifestyle choices, including:  Daily care of your teeth and gums.  Regular physical activity.  Eating a healthy diet.  Avoiding tobacco and drug use.  Limiting alcohol use.  Practicing safe sex.  Taking low-dose aspirin every day if recommended by your health care provider.  Taking vitamin and mineral supplements as recommended by your health care provider. What happens during an annual well check? The services and screenings done by your health care provider during your annual well check will depend on your age, overall health, lifestyle risk factors, and family history of disease. Counseling  Your health care provider may ask you questions about your:  Alcohol use.  Tobacco use.  Drug use.  Emotional well-being.  Home and relationship well-being.  Sexual activity.  Eating habits.  History of falls.  Memory and ability to understand (cognition).  Work and work Statistician.  Reproductive health. Screening  You may have the following tests or measurements:  Height, weight, and BMI.  Blood pressure.  Lipid and cholesterol levels. These may be checked every 5 years, or more frequently if you are over 70 years old.  Skin check.  Lung cancer screening. You may have this screening every year starting at age 68 if you have a 30-pack-year history of smoking and currently smoke or have quit within the past 15 years.  Fecal occult blood test (FOBT) of the stool. You may have this test every year starting at age 48.  Flexible sigmoidoscopy or colonoscopy. You may have a sigmoidoscopy every 5 years or a colonoscopy every 10 years starting at age 19.  Hepatitis  C blood test.  Hepatitis B blood test.  Sexually transmitted disease (STD) testing.  Diabetes screening. This is done by checking your blood sugar (glucose) after you have not  eaten for a while (fasting). You may have this done every 1-3 years.  Bone density scan. This is done to screen for osteoporosis. You may have this done starting at age 74.  Mammogram. This may be done every 1-2 years. Talk to your health care provider about how often you should have regular mammograms. Talk with your health care provider about your test results, treatment options, and if necessary, the need for more tests. Vaccines  Your health care provider may recommend certain vaccines, such as:  Influenza vaccine. This is recommended every year.  Tetanus, diphtheria, and acellular pertussis (Tdap, Td) vaccine. You may need a Td booster every 10 years.  Zoster vaccine. You may need this after age 69.  Pneumococcal 13-valent conjugate (PCV13) vaccine. One dose is recommended after age 31.  Pneumococcal polysaccharide (PPSV23) vaccine. One dose is recommended after age 42. Talk to your health care provider about which screenings and vaccines you need and how often you need them. This information is not intended to replace advice given to you by your health care provider. Make sure you discuss any questions you have with your health care provider. Document Released: 08/13/2015 Document Revised: 04/05/2016 Document Reviewed: 05/18/2015 Elsevier Interactive Patient Education  2017 [place] Prevention in the Home Falls can cause injuries. They can happen to people of all ages. There are many things you can do to make your home safe and to help prevent falls. What can I do on the outside of my home?  Regularly fix the edges of walkways and driveways and fix any cracks.  Remove anything that might make you trip as you walk through a door, such as a raised step or threshold.  Trim any bushes or trees on the path to your home.  Use bright outdoor lighting.  Clear any walking paths of anything that might make someone trip, such as rocks or tools.  Regularly check to see if  handrails are loose or broken. Make sure that both sides of any steps have handrails.  Any raised decks and porches should have guardrails on the edges.  Have any leaves, snow, or ice cleared regularly.  Use sand or salt on walking paths during winter.  Clean up any spills in your garage right away. This includes oil or grease spills. What can I do in the bathroom?  Use night lights.  Install grab bars by the toilet and in the tub and shower. Do not use towel bars as grab bars.  Use non-skid mats or decals in the tub or shower.  If you need to sit down in the shower, use a plastic, non-slip stool.  Keep the floor dry. Clean up any water that spills on the floor as soon as it happens.  Remove soap buildup in the tub or shower regularly.  Attach bath mats securely with double-sided non-slip rug tape.  Do not have throw rugs and other things on the floor that can make you trip. What can I do in the bedroom?  Use night lights.  Make sure that you have a light by your bed that is easy to reach.  Do not use any sheets or blankets that are too big for your bed. They should not hang down onto the floor.  Have a firm chair that has side arms.  You can use this for support while you get dressed.  Do not have throw rugs and other things on the floor that can make you trip. What can I do in the kitchen?  Clean up any spills right away.  Avoid walking on wet floors.  Keep items that you use a lot in easy-to-reach places.  If you need to reach something above you, use a strong step stool that has a grab bar.  Keep electrical cords out of the way.  Do not use floor polish or wax that makes floors slippery. If you must use wax, use non-skid floor wax.  Do not have throw rugs and other things on the floor that can make you trip. What can I do with my stairs?  Do not leave any items on the stairs.  Make sure that there are handrails on both sides of the stairs and use them. Fix  handrails that are broken or loose. Make sure that handrails are as long as the stairways.  Check any carpeting to make sure that it is firmly attached to the stairs. Fix any carpet that is loose or worn.  Avoid having throw rugs at the top or bottom of the stairs. If you do have throw rugs, attach them to the floor with carpet tape.  Make sure that you have a light switch at the top of the stairs and the bottom of the stairs. If you do not have them, ask someone to add them for you. What else can I do to help prevent falls?  Wear shoes that:  Do not have high heels.  Have rubber bottoms.  Are comfortable and fit you well.  Are closed at the toe. Do not wear sandals.  If you use a stepladder:  Make sure that it is fully opened. Do not climb a closed stepladder.  Make sure that both sides of the stepladder are locked into place.  Ask someone to hold it for you, if possible.  Clearly mark and make sure that you can see:  Any grab bars or handrails.  First and last steps.  Where the edge of each step is.  Use tools that help you move around (mobility aids) if they are needed. These include:  Canes.  Walkers.  Scooters.  Crutches.  Turn on the lights when you go into a dark area. Replace any light bulbs as soon as they burn out.  Set up your furniture so you have a clear path. Avoid moving your furniture around.  If any of your floors are uneven, fix them.  If there are any pets around you, be aware of where they are.  Review your medicines with your doctor. Some medicines can make you feel dizzy. This can increase your chance of falling. Ask your doctor what other things that you can do to help prevent falls. This information is not intended to replace advice given to you by your health care provider. Make sure you discuss any questions you have with your health care provider. Document Released: 05/13/2009 Document Revised: 12/23/2015 Document Reviewed:  08/21/2014 Elsevier Interactive Patient Education  2017 [employer].

## 2019-12-05 ENCOUNTER — Telehealth: Payer: Self-pay | Admitting: Internal Medicine

## 2019-12-05 MED ORDER — TRIAMCINOLONE ACETONIDE 0.1 % EX CREA: 1.0000 "application " | TOPICAL_CREAM | Freq: Two times a day (BID) | CUTANEOUS | 0 refills | Status: DC

## 2019-12-05 MED FILL — TRIAMCINOLONE 0.1% CREAM: 0.1 | 20 days supply | Qty: 30 | Fill #0

## 2019-12-05 NOTE — Telephone Encounter (Signed)
Pt aware of response below.  

## 2019-12-05 NOTE — Telephone Encounter (Signed)
-----   Message from Sheral Flow, [place] sent at [id number] 11:17 AM EDT ----- Regarding: Insect bites Patient came in today for AWV and has an area on her right forearm that she says is "chiggers."  She has been using hydrocortisone cream but she is still having some redness and itching.  She also tried benadyrl cream.  Its been there for 7 days.  Can you recommend anything over the counter or prescribe an ointment?   Mignon Pine

## 2019-12-05 NOTE — Telephone Encounter (Signed)
Let her know I sent a stronger steroid cream to her pharmacy for her to try.

## 2019-12-08 ENCOUNTER — Other Ambulatory Visit: Payer: Self-pay | Admitting: Cardiology

## 2019-12-09 DIAGNOSIS — M6281 Muscle weakness (generalized): Secondary | ICD-10-CM | POA: Diagnosis not present

## 2019-12-09 DIAGNOSIS — N3946 Mixed incontinence: Secondary | ICD-10-CM | POA: Diagnosis not present

## 2019-12-09 DIAGNOSIS — R102 Pelvic and perineal pain: Secondary | ICD-10-CM | POA: Diagnosis not present

## 2019-12-09 DIAGNOSIS — M6289 Other specified disorders of muscle: Secondary | ICD-10-CM | POA: Diagnosis not present

## 2019-12-15 DIAGNOSIS — N3946 Mixed incontinence: Secondary | ICD-10-CM | POA: Diagnosis not present

## 2019-12-15 DIAGNOSIS — R102 Pelvic and perineal pain: Secondary | ICD-10-CM | POA: Diagnosis not present

## 2019-12-15 DIAGNOSIS — M6281 Muscle weakness (generalized): Secondary | ICD-10-CM | POA: Diagnosis not present

## 2019-12-15 DIAGNOSIS — M6289 Other specified disorders of muscle: Secondary | ICD-10-CM | POA: Diagnosis not present

## 2019-12-22 DIAGNOSIS — M6281 Muscle weakness (generalized): Secondary | ICD-10-CM | POA: Diagnosis not present

## 2019-12-22 DIAGNOSIS — R102 Pelvic and perineal pain: Secondary | ICD-10-CM | POA: Diagnosis not present

## 2019-12-22 DIAGNOSIS — N3946 Mixed incontinence: Secondary | ICD-10-CM | POA: Diagnosis not present

## 2019-12-22 DIAGNOSIS — M6289 Other specified disorders of muscle: Secondary | ICD-10-CM | POA: Diagnosis not present

## 2019-12-23 DIAGNOSIS — M818 Other osteoporosis without current pathological fracture: Secondary | ICD-10-CM | POA: Diagnosis not present

## 2019-12-23 DIAGNOSIS — M810 Age-related osteoporosis without current pathological fracture: Secondary | ICD-10-CM | POA: Diagnosis not present

## 2019-12-23 DIAGNOSIS — Z8739 Personal history of other diseases of the musculoskeletal system and connective tissue: Secondary | ICD-10-CM | POA: Diagnosis not present

## 2019-12-24 DIAGNOSIS — M19012 Primary osteoarthritis, left shoulder: Secondary | ICD-10-CM | POA: Diagnosis not present

## 2019-12-24 DIAGNOSIS — M25511 Pain in right shoulder: Secondary | ICD-10-CM | POA: Diagnosis not present

## 2019-12-24 DIAGNOSIS — M25512 Pain in left shoulder: Secondary | ICD-10-CM | POA: Diagnosis not present

## 2019-12-26 MED FILL — ROSUVASTATIN CALCIUM 5 MG T: 5 | 90 days supply | Qty: 90 | Fill #0

## 2020-01-06 ENCOUNTER — Ambulatory Visit: Payer: Medicare Other

## 2020-01-09 ENCOUNTER — Ambulatory Visit (INDEPENDENT_AMBULATORY_CARE_PROVIDER_SITE_OTHER): Payer: Medicare Other | Admitting: Pharmacist

## 2020-01-09 ENCOUNTER — Other Ambulatory Visit: Payer: Self-pay

## 2020-01-09 ENCOUNTER — Telehealth: Payer: Self-pay | Admitting: Cardiology

## 2020-01-09 DIAGNOSIS — E782 Mixed hyperlipidemia: Secondary | ICD-10-CM

## 2020-01-09 DIAGNOSIS — I2510 Atherosclerotic heart disease of native coronary artery without angina pectoris: Secondary | ICD-10-CM | POA: Diagnosis not present

## 2020-01-09 NOTE — Patient Instructions (Signed)
It was a pleasure seeing you today!  MEDICATIONS: - Stop taking rosuvastatin (Crestor) for 2 weeks to see if you have an improvement in muscle pain - We will call you in 2 weeks to see how things are going - Depending on your improvement, we will either continue you on rosuvastatin 5mg  daily or switch to you pravastatin (Pravachol)  - Please make note of any improvement or change in your muscle pain over the next 2 weeks. -Call if you have questions about your medications.  Call the clinic at [phone] with questions or to reschedule future appointments.

## 2020-01-09 NOTE — Telephone Encounter (Signed)
New Message:     Pt said daughter will be coming in with her today for her appt with the pharmacist.

## 2020-01-09 NOTE — Progress Notes (Signed)
Patient ID: [name]                 DOB: 02-08-1946                    MRN: [id number]     HPI: [name] is a 74 y.o. female patient of Dr Meda Coffee referred to lipid clinic. PMH is significant for hypertension and hyperlipidemia who was previously seen in our office in 2014 for concerns of chest pain and underwent an exercise nuclear stress test that showed LVEF of 70% and no prior infarct or ischemia. Coronary CTA showed coronary calcium score of 56. This was 43 percentile for age and sex matched control. Moderate CAD in RCA/PDA, CT FFR was normal.He has been asymptomatic.  She presents today in good spirits. She has been on rosuvastatin for about 3 years. She had a period of time where she was on rosuvastatin three times weekly due to muscle pain in the past but eventually was put back on it daily. She does not experience the leg pain every day but does note that it is moderate to severe when it does occur. The leg pain wakes her up at night and she is unable to get relief. She describes it as "restless leg syndrome." She is unsure if it is related to the statin but would like to find out if it is related.   Current Medications: rosuvastatin 80m daily Intolerances: none Risk Factors: coronary calcium score of 56 LDL goal: <70  Diet: she has been been working on portion sizes. Has been eating less sweets. Still drinks diet soda occasionally. Drinks some tea too.   Exercise: works outside in her yard. Walks sometimes when it's not too hot. Has lost 15-20 lbs over the last year.  Family History: The patient's family history includes CAD in her maternal grandfather and mother; Diabetes in her father, paternal aunt, and paternal grandmother; Hypertension in her mother; Stroke in her mother.   Social History: Never smoked; occasional glass of wine  Labs (02/27/2019): TC 139, HDL 59, LDL 47, TG 167 (rosuvastatin 542mdaily)  Past Medical History:  Diagnosis Date   Allergy    seasonal     Ankylosing spondylitis (HCC)    Anxiety    Arthritis    Bell palsy    CTS (carpal tunnel syndrome)    Cystitis    Diverticulitis    Diverticulosis of colon    Dry eye syndrome    Exocrine pancreatic insufficiency    Frozen shoulder    GERD (gastroesophageal reflux disease)    HLA B27 (HLA B27 positive)    Hx of colonic polyps    Hyperlipidemia    NMR 2005   Hypertension    Hypothyroidism    Incontinence    Internal hemorrhoids    Interstitial cystitis    Kidney stone    Menopause    Osteoarthritis    Osteopenia    [facility] [address]   Pre-diabetes    S/P total hysterectomy and bilateral salpingo-oophorectomy 04/12/2014   Sinusitis    Sleep apnea    c-pap   Subjective visual disturbance of both eyes 12/30/2013   Tubular adenoma of colon    Vitamin D deficiency    Xerostomia    and Xeroophthalmia    Current Outpatient Medications on File Prior to Visit  Medication Sig Dispense Refill   acetaminophen (TYLENOL) 500 MG tablet Take 1,000 mg by mouth 3 (three)  times daily as needed for mild pain.      ACETAMINOPHEN PO acetaminophen     amLODipine (NORVASC) 2.5 MG tablet TAKE 1 TABLET BY MOUTH ONCE DAILY 30 tablet 6   Ascorbic Acid (VITAMIN C) 1000 MG tablet Take 2,000 mg by mouth daily.      aspirin 81 MG tablet Take 81 mg by mouth daily.       azelastine (ASTELIN) 0.1 % nasal spray One to two sprays each nostril twice a day as needed. 30 mL 5   blood glucose meter kit and supplies KIT Dispense based on patient and insurance preference. Use up to four times daily as directed. prediabetes 1 each 0   cetirizine (ZYRTEC) 10 MG tablet Take 10 mg by mouth daily.     Cholecalciferol (VITAMIN D-3) 25 MCG (1000 UT) CAPS Take 10,000 Units by mouth daily.      diclofenac Sodium (VOLTAREN) 1 % GEL Apply topically 4 (four) times daily.     dicyclomine (BENTYL) 10 MG capsule Take 1 capsule (10 mg total) by mouth 3 (three) times  daily as needed for spasms. 90 capsule 2   diphenoxylate-atropine (LOMOTIL) 2.5-0.025 MG tablet Take 1 tablet by mouth 4 (four) times daily as needed for diarrhea or loose stools. 90 tablet 2   EPINEPHrine (EPIPEN 2-PAK) 0.3 mg/0.3 mL IJ SOAJ injection Use as directed for severe allergic reactions 2 each 1   levocetirizine (XYZAL) 5 MG tablet Take 1 tablet (5 mg total) by mouth daily as needed for allergies. 30 tablet 5   lipase/protease/amylase (CREON) 36000 UNITS CPEP capsule Take 2 capsules by mouth with each meal, 1 capsule by mouth with each snack 720 capsule 1   Magnesium 200 MG TABS Take 1 tablet (200 mg total) by mouth daily. 30 each 6   Multiple Vitamins-Minerals (CENTRUM SILVER 50+WOMEN PO) Take 1 tablet by mouth daily.     olopatadine (PATANOL) 0.1 % ophthalmic solution Place 1 drop into both eyes 2 (two) times daily. 5 mL 12   Olopatadine HCl (PATADAY) 0.2 % SOLN Place 1 drop into both eyes daily as needed. 2.5 mL 5   Omega-3 Fatty Acids (FISH OIL BURP-LESS) 1000 MG CAPS Takes 1000 mg BID 60 capsule 0   omeprazole (PRILOSEC) 40 MG capsule TAKE 1 CAPSULE BY MOUTH ONCE DAILY 90 capsule 0   ondansetron (ZOFRAN) 4 MG tablet Take 1 tablet (4 mg total) by mouth every 8 (eight) hours as needed. 40 tablet 2   PAZEO 0.7 % SOLN PLACE 1 DROP INTO BOTH EYES DAILY. (Patient not taking: Reported on 12/04/2019) 2.5 mL 2   Polyethyl Glycol-Propyl Glycol (SYSTANE OP) Apply 1 drop to eye as needed. Apply to both eyes as needed     rosuvastatin (CRESTOR) 5 MG tablet TAKE 1 TABLET BY MOUTH ONCE DAILY 90 tablet 2   triamcinolone cream (KENALOG) 0.1 % Apply 1 application topically 2 (two) times daily. 30 g 0   vitamin B-12 (CYANOCOBALAMIN) 1000 MCG tablet Take 1,000 mcg by mouth daily.      vitamin E 400 UNIT capsule Take 400 Units by mouth daily.     zinc gluconate 50 MG tablet Take 50 mg by mouth daily.     No current facility-administered medications on file prior to visit.     Allergies  Allergen Reactions   Bactrim [Sulfamethoxazole-Trimethoprim] Other (See Comments)   Nitroglycerin Anaphylaxis    Hypotensive after NTG administration in ER during chest pain evaluation Pt states she almost died,  Pt states in a procedure done in 10/2016 she had no reaction to this medication, so not sure if it is an actual allergy.    Pentosan Polysulfate Other (See Comments)    Elevated liver count Elevated liver enzymes Elevated LFTs.......Marland Kitchen elmiron    Pentosan Polysulfate Sodium     Elevated LFTs.......Marland Kitchen elmiron    Promethazine Hcl Other (See Comments)    "jittery on the inside"   Sulfamethoxazole-Trimethoprim Nausea Only    Unknown reaction per pt, Pt thinks she may have felt sick to her stomach.    Losartan Other (See Comments)    Pt states caused insomnia, tingling sensation in legs, lightheaded, stomach pain   Promethazine Hcl    Lisinopril Cough    Assessment/Plan:  1. Hyperlipidemia -  - LDL 47 at goal < 70 due to history of CAD - Discussed continuing to work on reducing sweets and fatty foods from diet as able along with increased exercise - Hold rosuvastatin x2 weeks - F/u phone call in 2 weeks to assess improvement in symptoms - Can either continue rosuvastatin if pain is unrelated to statin or re-challenge with pravastatin 10-20mg daily  - She will need a new lipid panel soon. Suggest waiting 2-3 months after re-initiation of rosuvastatin or or change to pravastatin.    Kennon Holter, PharmD PGY1 Ambulatory [facility] Pharmacy Resident

## 2020-01-14 MED FILL — ONDANSETRON HCL 4 MG TABLET: 4 | 13 days supply | Qty: 40 | Fill #1

## 2020-01-14 MED FILL — OLOPATADINE HCL 0.1 % SOLN: 0.1 | 25 days supply | Qty: 5 | Fill #1

## 2020-01-19 ENCOUNTER — Ambulatory Visit: Payer: Medicare Other | Admitting: Physician Assistant

## 2020-01-19 NOTE — Telephone Encounter (Signed)
Please add this email from Mrs. Blincoe to Mr. [name], DOB 06/15/1946, medical record  He is having reflux symptoms but also has a history of Crohn's disease and is overdue for colorectal cancer screening He did not complete Cologuard as we discussed in office visit and now prefers colonoscopy  We can proceed to upper endoscopy to evaluate reflux/GERD and also screening colonoscopy in the [facility].  This can be scheduled in the next available double procedure slot Thanks JMP

## 2020-01-20 NOTE — Telephone Encounter (Signed)
Email has been added to [name]'s chart.

## 2020-01-23 MED FILL — ESTRADIOL 0.1 MG/GM CRM: 0.1 | 90 days supply | Qty: 43 | Fill #1

## 2020-01-23 MED FILL — AMLODIPINE 2.5 MG TABLET: 2.5 | 30 days supply | Qty: 30 | Fill #1

## 2020-01-26 ENCOUNTER — Telehealth: Payer: Self-pay | Admitting: Pharmacist

## 2020-01-26 DIAGNOSIS — E785 Hyperlipidemia, unspecified: Secondary | ICD-10-CM

## 2020-01-26 DIAGNOSIS — I351 Nonrheumatic aortic (valve) insufficiency: Secondary | ICD-10-CM

## 2020-01-26 DIAGNOSIS — E782 Mixed hyperlipidemia: Secondary | ICD-10-CM

## 2020-01-26 DIAGNOSIS — I2510 Atherosclerotic heart disease of native coronary artery without angina pectoris: Secondary | ICD-10-CM

## 2020-01-26 DIAGNOSIS — Z1589 Genetic susceptibility to other disease: Secondary | ICD-10-CM

## 2020-01-26 DIAGNOSIS — I10 Essential (primary) hypertension: Secondary | ICD-10-CM

## 2020-01-26 NOTE — Telephone Encounter (Signed)
Called pt to follow up with statin tolerability.  She has now been off of her rosuvastatin for 2 weeks. States that some of her aches have improved, however she is not sure that it's related to her statin. Discussed trying low dose pravastatin. She wishes to resume rosuvastatin instead. Will start back at 3x per week frequency and have pt monitor for symptoms closely. Advised pt that if she is feeling fine, she can increase frequency back to daily (LDL at goal on 5mg  daily dosing).  Will recheck lipids in 2-3 months to assess if 3x weekly dosing is keeping LDL at goal.

## 2020-01-29 ENCOUNTER — Encounter: Payer: Self-pay | Admitting: Physician Assistant

## 2020-01-29 NOTE — Progress Notes (Signed)
Cardiology Office Note    Date:  02/03/2020   ID:  [name], DOB 03-30-1946, MRN [id number]  PCP:  Binnie Rail, MD  Cardiologist:  Ena Dawley, MD  Electrophysiologist:  None   Chief Complaint: f/u CAD  History of Present Illness:   [name] is a 74 y.o. female with history of moderate CAD by 2018 coronary CTA, aortic atherosclerosis, HTN, HLD, ankylosing spondylitis/HLA B27 positive, anxiety, arthritis, Bell palsy, diverticulosis, pancreatic insufficiency, HLD, HTN, interstitial cystitis, OA, pre-DM, OSA on CPAP (followed at [facility]) who presents for routine cardiology evaluation. [name] is also followed by our clinic.  She has been established with Dr. Meda Coffee for history of chest pain in the context of HLA B27 positivity. Nuclear stress test 12/2012 was normal, labeit with limited exercise capacity. Coronary CT 10/2016 showed calcium score in the 59th perecntile, moderate CAD in RCA/PDA and narrowing of the mid to distal LAD, with negative FFR. 04/2017 CT showed no abnormalities of the aorta. Last echo in 06/2018 showed EF 55-60%, grade 1 DD, mild AI, mildly dilated LA, mildly dilated ascending aorta. She has been followed by our pharmD lipid clinic with history of myalgias, unclear if totally related to statin. She describes this as a creepy crawling feeling or the need to move her legs. She does not recall if the symptoms started coinciding with the statin initiation. It did improve slightly with the statin holiday. She is now back on Crestor 3x/week and has felt this feeling twice. She does recall a prior history of IDA. She likes to work in the garden but has been limited by shoulder problems for which she is going to be having left shoulder surgery at some point (does not hurt during activity but later in the day afterwards). She denies any angina or DOE with activities like walking or gardening. Every other day or less she gets a fleeting pinpoint feeling of chest  discomfort lasting a few seconds, relieved with direct pressure with her fingers to this area. It is not related to exertion and not associated with other concerning symptoms. She also notes puffiness around her eyes in the morning and occasional sockline ankle edema. She also has noticed a faint redness (appears like a papular rash) under the skin to her lower legs below the knee for quite a while. No systemic rashes otherwise.  Labwork independently reviewed: 10/2019 CBC wnl, K 4.4, Cr 1.01 01/2019 LDL 47 11/2018 TSH wnl  Past Medical History:  Diagnosis Date  . Allergy    seasonal  . Ankylosing spondylitis ([place])   . Anxiety   . Arthritis   . Bell palsy   . CAD (coronary artery disease)    a. Coronary CT 10/2016 showed calcium score in the 59th perecntile, moderate CAD in RCA/PDA and narrowing of the mid to distal LAD, with negative FFR.  . CTS (carpal tunnel syndrome)   . Cystitis   . Diverticulitis   . Diverticulosis of colon   . Dry eye syndrome   . Exocrine pancreatic insufficiency   . Frozen shoulder   . GERD (gastroesophageal reflux disease)   . HLA B27 (HLA B27 positive)   . Hx of colonic polyps   . Hyperlipidemia    NMR 2005  . Hypertension   . Hypothyroidism   . Incontinence   . Internal hemorrhoids   . Interstitial cystitis   . Kidney stone   . Menopause   . Mild aortic insufficiency   . Mild  ascending aorta dilatation (HCC)   . Osteoarthritis   . Osteopenia    [facility] [place]   . S/P total hysterectomy and bilateral salpingo-oophorectomy 04/12/2014  . Sinusitis   . Sleep apnea    c-pap  . Subjective visual disturbance of both eyes 12/30/2013  . Tubular adenoma of colon   . Vitamin D deficiency   . Xerostomia    and Xeroophthalmia    Past Surgical History:  Procedure Laterality Date  . BLADDER SURGERY     for incontinence  . CARPAL TUNNEL RELEASE Left 12/22/2014   Procedure: LEFT CARPAL TUNNEL RELEASE;  Surgeon: Daryll Brod, MD;  Location: [facility];  Service: Orthopedics;  Laterality: Left;  . CARPAL TUNNEL RELEASE Right 02/18/2019   Procedure: RIGHT CARPAL TUNNEL RELEASE;  Surgeon: Daryll Brod, MD;  Location: [place] [facility];  Service: Orthopedics;  Laterality: Right;  . COLONOSCOPY W/ POLYPECTOMY     adenomatous poyp 04-2008  . colonscopy     tics 10-2002  . CYSTOSCOPY WITH RETROGRADE PYELOGRAM, URETEROSCOPY AND STENT PLACEMENT Left 09/01/2013   Procedure: CYSTOSCOPY WITH RETROGRADE PYELOGRAM, AND LEFT STENT PLACEMENT;  Surgeon: Molli Hazard, MD;  Location: [facility];  Service: Urology;  Laterality: Left;  . elevated LFTs     due to [place]  . FINGER SURGERY Left 2004   2nd finger  . G 1 P 1    . interstitial cystitis     with clot in catheter  . PARTIAL HYSTERECTOMY     with one ovary left  . SHOULDER ARTHROSCOPY Left   . TOTAL ABDOMINAL HYSTERECTOMY W/ BILATERAL SALPINGOOPHORECTOMY     dysfunctional menses    Current Medications: Current Meds  Medication Sig  . acetaminophen (TYLENOL) 500 MG tablet Take 1,000 mg by mouth 3 (three) times daily as needed.   Marland Kitchen amLODipine (NORVASC) 2.5 MG tablet TAKE 1 TABLET BY MOUTH ONCE DAILY  . Ascorbic Acid (VITAMIN C) 1000 MG tablet Take 1,000 mg by mouth 2 (two) times daily.   Marland Kitchen aspirin 81 MG tablet Take 81 mg by mouth daily.    Marland Kitchen azelastine (ASTELIN) 0.1 % nasal spray One to two sprays each nostril twice a day as needed.  . blood glucose meter kit and supplies KIT Dispense based on patient and insurance preference. Use up to four times daily as directed. prediabetes  . cetirizine (ZYRTEC) 10 MG tablet Take 10 mg by mouth daily.  . Cholecalciferol (VITAMIN D-3) 25 MCG (1000 UT) CAPS Take 10,000 Units by mouth daily.   . diclofenac Sodium (VOLTAREN) 1 % GEL Apply topically 4 (four) times daily as needed.   . dicyclomine (BENTYL) 10 MG capsule Take 1 capsule (10 mg total) by mouth 3 (three) times daily as needed for spasms.  .  diphenoxylate-atropine (LOMOTIL) 2.5-0.025 MG tablet Take 1 tablet by mouth 4 (four) times daily as needed for diarrhea or loose stools.  Marland Kitchen EPINEPHrine (EPIPEN 2-PAK) 0.3 mg/0.3 mL IJ SOAJ injection Use as directed for severe allergic reactions  . estradiol (ESTRACE) 0.1 MG/GM vaginal cream estradiol 0.01% (0.1 mg/gram) vaginal cream  . HYDROcodone-acetaminophen (NORCO/VICODIN) 5-325 MG tablet hydrocodone 5 mg-acetaminophen 325 mg tablet  TAKE 1 TABLET BY MOUTH THREE TIMES DAILY AS NEEDED FOR 7 DAYS  . levocetirizine (XYZAL) 5 MG tablet Take 1 tablet (5 mg total) by mouth daily as needed for allergies.  Marland Kitchen lipase/protease/amylase (CREON) 36000 UNITS CPEP capsule Take 2 capsules by mouth with  each meal, 1 capsule by mouth with each snack  . Magnesium 200 MG TABS Take 1 tablet (200 mg total) by mouth daily.  . Magnesium Oxide 400 (240 Mg) MG TABS   . Multiple Vitamins-Minerals (CENTRUM SILVER 50+WOMEN PO) Take 1 tablet by mouth daily.  . naloxone (NARCAN) 4 MG/0.1ML LIQD nasal spray kit Narcan 4 mg/actuation nasal spray  PLACE 1 SPRAY INTO EACH NOSTRIL FOR SUSPECTED OPIOID OVERDOSE. MAY REPEAT 2 - 3 MINUTES WITH NEW BOTTLE IN OTHER NOSTRIL  . olopatadine (PATANOL) 0.1 % ophthalmic solution Place 1 drop into both eyes 2 (two) times daily.  . Omega-3 Fatty Acids (FISH OIL BURP-LESS) 1000 MG CAPS Takes 1000 mg BID  . ondansetron (ZOFRAN) 4 MG tablet Take 1 tablet (4 mg total) by mouth every 8 (eight) hours as needed.  Vladimir Faster Glycol-Propyl Glycol (SYSTANE OP) Apply 1 drop to eye as needed. Apply to both eyes as needed  . rosuvastatin (CRESTOR) 5 MG tablet Take 1 tablet (5 mg total) by mouth 3 (three) times a week.  . vitamin B-12 (CYANOCOBALAMIN) 1000 MCG tablet Take 1,000 mcg by mouth 2 (two) times daily.   . vitamin E 400 UNIT capsule Take 400 Units by mouth daily.  Marland Kitchen zinc gluconate 50 MG tablet Take 50 mg by mouth daily.  . [DISCONTINUED] omeprazole (PRILOSEC) 40 MG capsule TAKE 1 CAPSULE BY  MOUTH ONCE DAILY     Allergies:   Bactrim [sulfamethoxazole-trimethoprim], Nitroglycerin, Pentosan polysulfate, Pentosan polysulfate sodium, Promethazine hcl, Sulfamethoxazole-trimethoprim, Losartan, Promethazine hcl, and Lisinopril   Social History   Socioeconomic History  . Marital status: Married    Spouse name: Not on file  . Number of children: 1  . Years of education: BS  . Highest education level: Not on file  Occupational History  . Occupation: retired  . Occupation: Pharmacist, hospital    Comment: Kindergarten  Tobacco Use  . Smoking status: Never Smoker  . Smokeless tobacco: Never Used  Vaping Use  . Vaping Use: Never used  Substance and Sexual Activity  . Alcohol use: Yes    Comment: social  . Drug use: No  . Sexual activity: Not Currently  Other Topics Concern  . Not on file  Social History Narrative   Lives   Caffeine use:    Married      Social Determinants of Health   Financial Resource Strain:   . Difficulty of Paying Living Expenses:   Food Insecurity:   . Worried About Charity fundraiser in the Last Year:   . Arboriculturist in the Last Year:   Transportation Needs:   . Film/video editor (Medical):   Marland Kitchen Lack of Transportation (Non-Medical):   Physical Activity:   . Days of Exercise per Week:   . Minutes of Exercise per Session:   Stress:   . Feeling of Stress :   Social Connections:   . Frequency of Communication with Friends and Family:   . Frequency of Social Gatherings with Friends and Family:   . Attends Religious Services:   . Active Member of Clubs or Organizations:   . Attends Archivist Meetings:   Marland Kitchen Marital Status:      Family History:  The patient's family history includes Allergic rhinitis in her sister and sister; Asthma in her mother; Breast cancer in her maternal aunt; Cirrhosis in her father; Colon cancer (age of onset: 64) in her sister; Coronary artery disease in her maternal grandfather and mother; Dementia in her  mother; Diabetes in her father, paternal aunt, and paternal grandmother; Food Allergy in her sister; Hypertension in her mother; Renal cancer in her paternal aunt; Stroke in her mother. There is no history of Esophageal cancer or Stomach cancer.  ROS:   Please see the history of present illness.  All other systems are reviewed and otherwise negative.    EKGs/Labs/Other Studies Reviewed:    Studies reviewed are outlined and summarized above. Reports included below if pertinent.  2D Echo 06/2018 - Left ventricle: The cavity size was normal. Systolic function was  normal. The estimated ejection fraction was in the range of 55%  to 60%. Wall motion was normal; there were no regional wall  motion abnormalities. Doppler parameters are consistent with  abnormal left ventricular relaxation (grade 1 diastolic  dysfunction).  - Aortic valve: There was mild regurgitation.  - Left atrium: The atrium was mildly dilated.  - Atrial septum: No defect or patent foramen ovale was identified.   ABIs 12/2018 Summary:  Right: Resting right ankle-brachial index is within normal range. No  evidence of significant right lower extremity arterial disease. The right  toe-brachial index is normal.   Left: Resting left ankle-brachial index is within normal range. No  evidence of significant left lower extremity arterial disease. The left  toe-brachial index is normal.    *See table(s) above for measurements and observations.  Electronically signed by Jenkins Rouge MD on 01/13/2019 at 4:58:26 PM.    Coronary CT 2018 Aorta:  Normal size.  No calcifications.  No dissection.  Aortic Valve:  Trileaflet.  No calcifications.  Coronary Arteries:  Normal coronary origin.  Right dominance.  RCA is a large dominant artery that gives rise to PDA and PLVB. There is diffuse mixed plaque in the mid and distal RCA mostly with 25-50% stenosis. In the mid portion there is focal 50-69% stenosis and another  focal 50-69% stenosis in the distal RCA. PDA has small lumen, that might represent a stenosis or a poor dilatation with sl NTG.  Left main is a large long artery that gives rise to LAD, ramus intermedius and LCX arteries. Left main has no plaque.  LAD is a large vessel that gives rise to one large diagonal branch. Proximal LAD has a mild calcified plaque with associated stenosis 25-50%. After takeoff of the diagonal branch the LAD lumen decreases significantly without any obvious plaque.  RI is a very small artery.  LCX is a non-dominant artery that gives rise to one OM1 branch. There is no plaque.  Other findings:  Normal pulmonary vein drainage into the left atrium.  Normal let atrial appendage without a thrombus.  IMPRESSION: 1. Coronary calcium score of 56. This was 79 percentile for age and sex matched control.  2. Normal coronary origin with right dominance.  3. At least moderate CAD in RCA/PDA and narrowing of the mid to distal LAD. We will send the study for further evaluation with CT FFR. 1. Left Main:  No significant stenosis.  2. LAD: No significant stenosis.  FFR 0.97. 3. LCX: No significant stenosis.  FFR 0.94. 4. RCA: No significant stenosis.  Minimum FFR 0.92.  In PLVB 0.90.  IMPRESSION: 1.  CT FFR analysis didn't show any significant stenosis.  Electronically Signed   By: Ena Dawley   On: 11/05/2016 13:29    EKG:  EKG is ordered today, personally reviewed, demonstrating NSR 60bpm, normal intervals, delayed R wave progression, LAFB, no acute STT changes from prior  Recent Labs: 11/20/2019:  ALT 20; BUN 17; Creatinine, Ser 1.01; Hemoglobin 12.1; Platelets 242.0; Potassium 4.4; Sodium 141  Recent Lipid Panel    Component Value Date/Time   CHOL 139 02/27/2019 0807   TRIG 167 (H) 02/27/2019 0807   TRIG 181 (H) 07/23/2006 1101   HDL 59 02/27/2019 0807   CHOLHDL 2.4 02/27/2019 0807   CHOLHDL 2 05/03/2018 0913   VLDL 19.0 05/03/2018  0913   LDLCALC 47 02/27/2019 0807   LDLDIRECT 114.0 05/02/2016 0919    PHYSICAL EXAM:    VS:  BP 120/82   Pulse 60   Ht '5\' 2"'$  (1.575 m)   Wt 153 lb 6.4 oz (69.6 kg)   SpO2 98%   BMI 28.06 kg/m   BMI: Body mass index is 28.06 kg/m.  GEN: Well nourished, well developed WF, in no acute distress HEENT: normocephalic, atraumatic Neck: no JVD, carotid bruits, or masses Cardiac: RRR; no murmurs, rubs, or gallops, no edema  Respiratory:  clear to auscultation bilaterally, normal work of breathing GI: soft, nontender, nondistended, + BS MS: no deformity or atrophy Skin: warm and dry, faint reddish papular rash to lower extremities below the knee (no petechiae or warmth, no focal areas of induration or macules) Neuro:  Alert and Oriented x 3, Strength and sensation are intact, follows commands Psych: euthymic mood, full affect  Wt Readings from Last 3 Encounters:  02/03/20 153 lb 6.4 oz (69.6 kg)  12/04/19 153 lb 11.2 oz (69.7 kg)  11/20/19 154 lb (69.9 kg)     ASSESSMENT & PLAN:   1. CAD - overall stable. She does describe a rare atypical focal pain in her left chest wall lasting a few seconds, relieved with pressure to that area. No symptoms with exertion. Will continue to monitor at this time, but reviewed warning symptoms with patient. EKG unchanged from prior. Continue risk factor modification. From surgical standpoint as long as her symptoms do not change from their present state, would be OK to proceed as requested. No formal clearance has been requested yet. 2. Hyperlipidemia - continue Crestor 3x/week for now with labs planned by lipid clinic, pending evaluation of iron as below. 3. Essential HTN - blood pressure controlled. See below for changes made today. 4. Mildly dilated ascending aorta with mild aortic insufficiency - Dr. Meda Coffee has previously suggested following every other year. Will arrange echo in 06/2020. However, if her edema persists or worsens despite changing  medications, would arrange sooner - she will keep [place] apprised how she does. 5. Restless legs with history of iron deficiency - symptoms are more suggestive of RLS rather than typical myalgias so will check ferritin, iron, TIBC today. She does have mild redness (slight papular rash) to lower legs which she reports has been going on for quite a while. It did not correlate with any specific medication initiation. Given her rheumatologic disease I have asked her to discuss with her PCP. 6. Lower extremity edema - not present on exam today. No overt periorbital edema noted. Check CMET and TSH. May be related to vasodilatation from amlodipine. Will d/c amlodipine, start HCTZ 12.'5mg'$  daily and recheck BMET in 1 week.  Disposition: F/u with Dr. Meda Coffee in 6 months.  Medication Adjustments/Labs and Tests Ordered: Current medicines are reviewed at length with the patient today.  Concerns regarding medicines are outlined above. Medication changes, Labs and Tests ordered today are summarized above and listed in the Patient Instructions accessible in Encounters.   Signed, Charlie Pitter, PA-C  02/03/2020 10:45 AM  [facility] [place], [place], [place]  [address] Phone: [phone]; Fax: [phone]

## 2020-02-02 ENCOUNTER — Other Ambulatory Visit: Payer: Self-pay | Admitting: Internal Medicine

## 2020-02-02 DIAGNOSIS — K219 Gastro-esophageal reflux disease without esophagitis: Secondary | ICD-10-CM

## 2020-02-03 ENCOUNTER — Other Ambulatory Visit: Payer: Self-pay

## 2020-02-03 ENCOUNTER — Ambulatory Visit (INDEPENDENT_AMBULATORY_CARE_PROVIDER_SITE_OTHER): Payer: Medicare Other | Admitting: Physician Assistant

## 2020-02-03 ENCOUNTER — Encounter: Payer: Self-pay | Admitting: Physician Assistant

## 2020-02-03 VITALS — BP 120/82 | HR 60 | Ht 62.0 in | Wt 153.4 lb

## 2020-02-03 DIAGNOSIS — G2581 Restless legs syndrome: Secondary | ICD-10-CM

## 2020-02-03 DIAGNOSIS — I10 Essential (primary) hypertension: Secondary | ICD-10-CM | POA: Diagnosis not present

## 2020-02-03 DIAGNOSIS — E785 Hyperlipidemia, unspecified: Secondary | ICD-10-CM | POA: Diagnosis not present

## 2020-02-03 DIAGNOSIS — I2510 Atherosclerotic heart disease of native coronary artery without angina pectoris: Secondary | ICD-10-CM

## 2020-02-03 DIAGNOSIS — E611 Iron deficiency: Secondary | ICD-10-CM

## 2020-02-03 DIAGNOSIS — I77810 Thoracic aortic ectasia: Secondary | ICD-10-CM | POA: Diagnosis not present

## 2020-02-03 DIAGNOSIS — R600 Localized edema: Secondary | ICD-10-CM

## 2020-02-03 DIAGNOSIS — I351 Nonrheumatic aortic (valve) insufficiency: Secondary | ICD-10-CM

## 2020-02-03 LAB — COMPREHENSIVE METABOLIC PANEL
ALT: 25 IU/L (ref 0–32)
AST: 26 IU/L (ref 0–40)
Albumin/Globulin Ratio: 1.8 (ref 1.2–2.2)
Albumin: 4.5 g/dL (ref 3.7–4.7)
Alkaline Phosphatase: 135 IU/L — ABNORMAL HIGH (ref 48–121)
BUN/Creatinine Ratio: 13 (ref 12–28)
BUN: 13 mg/dL (ref 8–27)
Bilirubin Total: 0.3 mg/dL (ref 0.0–1.2)
CO2: 23 mmol/L (ref 20–29)
Calcium: 10 mg/dL (ref 8.7–10.3)
Chloride: 102 mmol/L (ref 96–106)
Creatinine, Ser: 1 mg/dL (ref 0.57–1.00)
GFR calc Af Amer: 64 mL/min/{1.73_m2} (ref >59)
GFR calc non Af Amer: 55 mL/min/{1.73_m2} — ABNORMAL LOW (ref >59)
Globulin, Total: 2.5 g/dL (ref 1.5–4.5)
Glucose: 97 mg/dL (ref 65–99)
Potassium: 4.7 mmol/L (ref 3.5–5.2)
Sodium: 141 mmol/L (ref 134–144)
Total Protein: 7 g/dL (ref 6.0–8.5)

## 2020-02-03 LAB — IRON AND TIBC
Iron Saturation: 26 % (ref 15–55)
Iron: 79 ug/dL (ref 27–139)
Total Iron Binding Capacity: 299 ug/dL (ref 250–450)
UIBC: 220 ug/dL (ref 118–369)

## 2020-02-03 LAB — FERRITIN: Ferritin: 95 ng/mL (ref 15–150)

## 2020-02-03 LAB — TSH: TSH: 3.45 u[IU]/mL (ref 0.450–4.500)

## 2020-02-03 MED ORDER — HYDROCHLOROTHIAZIDE 12.5 MG PO CAPS
12.5000 mg | ORAL_CAPSULE | Freq: Every day | ORAL | 3 refills | Status: DC
Start: 2020-02-03 — End: 2020-03-02

## 2020-02-03 MED FILL — HYDROCHLOROTHIAZIDE 12.5 MG: 12.5 | 90 days supply | Qty: 90 | Fill #0

## 2020-02-03 MED FILL — OMEPRAZOLE 40 MG CPDR: 40 | 90 days supply | Qty: 90 | Fill #0

## 2020-02-03 NOTE — Patient Instructions (Addendum)
  Medication Instructions:  Your physician has recommended you make the following change in your medication:  1.  START Hydrochlorothiazide 12.5 mg taking 1 daily  *If you need a refill on your cardiac medications before your next appointment, please call your pharmacy*   Lab Work: TODAY:  IRON, FERRITIN, TSH, CMET, & TIBC   02/11/2020:  COME TO OFFICE FOR A BMET. (anytime after 7:30 a.m)  If you have labs (blood work) drawn today and your tests are completely normal, you will receive your results only by: Marland Kitchen MyChart Message (if you have MyChart) OR . A paper copy in the mail If you have any lab test that is abnormal or we need to change your treatment, we will call you to review the results.   Testing/Procedures: Your physician has requested that you have an echocardiogram 06/2020. Echocardiography is a painless test that uses sound waves to create images of your heart. It provides your doctor with information about the size and shape of your heart and how well your heart's chambers and valves are working. This procedure takes approximately one hour. There are no restrictions for this procedure.     Follow-Up: At [facility], you and your health needs are our priority.  As part of our continuing mission to provide you with exceptional heart care, we have created designated Provider Care Teams.  These Care Teams include your primary Cardiologist (physician) and Advanced Practice Providers (APPs -  Physician Assistants and Nurse Practitioners) who all work together to provide you with the care you need, when you need it.  We recommend signing up for the patient portal called "MyChart".  Sign up information is provided on this After Visit Summary.  MyChart is used to connect with patients for Virtual Visits (Telemedicine).  Patients are able to view lab/test results, encounter notes, upcoming appointments, etc.  Non-urgent messages can be sent to your provider as well.   To learn more  about what you can do with MyChart, go to [email or web address].    Your next appointment:   6 month(s)  The format for your next appointment:   In Person  Provider:   You may see Ena Dawley, MD or one of the following Advanced Practice Providers on your designated Care Team:    Melina Copa, PA-C  Ermalinda Barrios, PA-C

## 2020-02-04 ENCOUNTER — Telehealth: Payer: Self-pay | Admitting: Physician Assistant

## 2020-02-04 DIAGNOSIS — E785 Hyperlipidemia, unspecified: Secondary | ICD-10-CM

## 2020-02-04 NOTE — Telephone Encounter (Signed)
New message ° ° °Patient is returning call for lab results. Please call. °

## 2020-02-04 NOTE — Telephone Encounter (Signed)
Returned call to discuss lab results, let a message for pt to call back.

## 2020-02-05 ENCOUNTER — Telehealth: Payer: Self-pay | Admitting: Physician Assistant

## 2020-02-05 MED ORDER — ROSUVASTATIN CALCIUM 5 MG PO TABS
ORAL_TABLET | ORAL | 3 refills | Status: DC
Start: 2020-02-05 — End: 2020-08-31

## 2020-02-05 NOTE — Addendum Note (Signed)
Addended by: Gaetano Net on: 02/05/2020 08:32 AM   Modules accepted: Orders

## 2020-02-05 NOTE — Telephone Encounter (Signed)
Duplicate. See phone note 02/04/20.

## 2020-02-05 NOTE — Telephone Encounter (Signed)
-----  Message from Charlie Pitter, [place] sent at 02/04/2020  1:36 PM EDT ----- Please let pt know labs are stable. Iron labs are normal. However, I still think her leg symptoms would benefit from evaluation by primary care given that they sound like restless legs - also needs to see her PCP for that reddish coloration to them. Her alk phos (liver/bone marker) is minimally elevated. All her other liver function tests are normal. I am not acutely concerned about this since it can vary frequently. Please change the 02/11/20 lab to a CMET instead of BMET to trend to trend it. I touched base with pharmacy team about her cholesterol regimen. They feel it would be reasonable to change Crestor to 2x week versus change to pravastatin '20mg'$  once daily. Keep labs 04/14/20 for lipid profile but would also make sure she has f/u LFTs at that time too.

## 2020-02-05 NOTE — Telephone Encounter (Signed)
Transferred call to Jennifer.

## 2020-02-11 ENCOUNTER — Other Ambulatory Visit: Payer: Medicare Other

## 2020-02-20 ENCOUNTER — Telehealth: Payer: Self-pay | Admitting: Cardiology

## 2020-02-20 NOTE — Telephone Encounter (Signed)
Patient called and wanted to speak with [name] about some of her medications.

## 2020-02-20 NOTE — Telephone Encounter (Signed)
Thank you, [name]!

## 2020-02-20 NOTE — Telephone Encounter (Signed)
Pt is calling in asking should she have stopped her amlodipine before starting low dose HCTZ, as advised by Melina Copa PA-C at last OV on 7/6.  Pt states she is still taking her amlodipine and has not started new regimen of HCTZ 12.5 mg po daily yet, for she states she thought Dayna mentioned for her to stop amlodipine before starting this.  Pt states it was not on her AVS to stop this, so she wanted to clarify instructions.  Informed the pt that Dayna did want her to stop taking amlodipine, start HCTZ 12.5 mg po daily, and come in for a CMET in one week, as mentioned below in her A&P.  Advised the pt to stop amlodipine and start taking her HCTZ tomorrow.  Rescheduled her CMET for one week out on 7/30.  Pt is aware of this lab appt. Discontinued amlodipine from her med list, as authorized in this note. Pt verbalized understanding and agrees with this plan. Will send this message to [facility], as a general FYI.     ASSESSMENT & PLAN:   1. CAD - overall stable. She does describe a rare atypical focal pain in her left chest wall lasting a few seconds, relieved with pressure to that area. No symptoms with exertion. Will continue to monitor at this time, but reviewed warning symptoms with patient. EKG unchanged from prior. Continue risk factor modification. From surgical standpoint as long as her symptoms do not change from their present state, would be OK to proceed as requested. No formal clearance has been requested yet. 2. Hyperlipidemia - continue Crestor 3x/week for now with labs planned by lipid clinic, pending evaluation of iron as below. 3. Essential HTN - blood pressure controlled. See below for changes made today. 4. Mildly dilated ascending aorta with mild aortic insufficiency - Dr. Meda Coffee has previously suggested following every other year. Will arrange echo in 06/2020. However, if her edema persists or worsens despite changing medications, would arrange sooner - she will keep [place] apprised how she  does. 5. Restless legs with history of iron deficiency - symptoms are more suggestive of RLS rather than typical myalgias so will check ferritin, iron, TIBC today. She does have mild redness (slight papular rash) to lower legs which she reports has been going on for quite a while. It did not correlate with any specific medication initiation. Given her rheumatologic disease I have asked her to discuss with her PCP. 6. Lower extremity edema - not present on exam today. No overt periorbital edema noted. Check CMET and TSH. May be related to vasodilatation from amlodipine. Will d/c amlodipine, start HCTZ 12.5mg  daily and recheck BMET in 1 week.  Disposition: F/u with Dr. Meda Coffee in 6 months

## 2020-02-25 DIAGNOSIS — M545 Low back pain: Secondary | ICD-10-CM | POA: Diagnosis not present

## 2020-02-25 DIAGNOSIS — M8589 Other specified disorders of bone density and structure, multiple sites: Secondary | ICD-10-CM | POA: Diagnosis not present

## 2020-02-27 ENCOUNTER — Other Ambulatory Visit: Payer: Medicare Other | Admitting: *Deleted

## 2020-02-27 ENCOUNTER — Other Ambulatory Visit: Payer: Self-pay

## 2020-02-27 DIAGNOSIS — E785 Hyperlipidemia, unspecified: Secondary | ICD-10-CM

## 2020-02-27 LAB — COMPREHENSIVE METABOLIC PANEL
ALT: 24 IU/L (ref 0–32)
AST: 28 IU/L (ref 0–40)
Albumin/Globulin Ratio: 1.9 (ref 1.2–2.2)
Albumin: 4.6 g/dL (ref 3.7–4.7)
Alkaline Phosphatase: 122 IU/L — ABNORMAL HIGH (ref 48–121)
BUN/Creatinine Ratio: 25 (ref 12–28)
BUN: 25 mg/dL (ref 8–27)
Bilirubin Total: 0.3 mg/dL (ref 0.0–1.2)
CO2: 23 mmol/L (ref 20–29)
Calcium: 10.1 mg/dL (ref 8.7–10.3)
Chloride: 101 mmol/L (ref 96–106)
Creatinine, Ser: 1.02 mg/dL — ABNORMAL HIGH (ref 0.57–1.00)
GFR calc Af Amer: 62 mL/min/{1.73_m2} (ref >59)
GFR calc non Af Amer: 54 mL/min/{1.73_m2} — ABNORMAL LOW (ref >59)
Globulin, Total: 2.4 g/dL (ref 1.5–4.5)
Glucose: 99 mg/dL (ref 65–99)
Potassium: 4.2 mmol/L (ref 3.5–5.2)
Sodium: 139 mmol/L (ref 134–144)
Total Protein: 7 g/dL (ref 6.0–8.5)

## 2020-03-02 ENCOUNTER — Other Ambulatory Visit: Payer: Self-pay | Admitting: Internal Medicine

## 2020-03-02 ENCOUNTER — Encounter: Payer: Self-pay | Admitting: Internal Medicine

## 2020-03-02 ENCOUNTER — Other Ambulatory Visit: Payer: Self-pay

## 2020-03-02 ENCOUNTER — Ambulatory Visit (INDEPENDENT_AMBULATORY_CARE_PROVIDER_SITE_OTHER): Payer: Medicare Other | Admitting: Internal Medicine

## 2020-03-02 VITALS — BP 130/82 | HR 70 | Temp 98.5°F | Wt 155.4 lb

## 2020-03-02 DIAGNOSIS — R252 Cramp and spasm: Secondary | ICD-10-CM | POA: Diagnosis not present

## 2020-03-02 DIAGNOSIS — I2510 Atherosclerotic heart disease of native coronary artery without angina pectoris: Secondary | ICD-10-CM

## 2020-03-02 LAB — MAGNESIUM: Magnesium: 2 mg/dL (ref 1.5–2.5)

## 2020-03-02 MED ORDER — AMLODIPINE BESYLATE 2.5 MG PO TABS
2.5000 mg | ORAL_TABLET | Freq: Every day | ORAL | 3 refills | Status: DC
Start: 2020-03-02 — End: 2020-07-09

## 2020-03-02 MED FILL — AMLODIPINE 2.5 MG TABLET: 2.5 | 90 days supply | Qty: 90 | Fill #0

## 2020-03-02 NOTE — Patient Instructions (Signed)
We will check the labs and change you back to the amlodipine from the hctz.

## 2020-03-02 NOTE — Progress Notes (Signed)
° °  Subjective:   Patient ID: [name], female    DOB: 1945-11-01, 74 y.o.   MRN: [id number]  HPI The patient is a 74 YO female coming in for concerns about leg pain. These have been ongoing for a number of years and she has spoken to her primary care doctor and other doctors about it. She has had vascular evaluation with normal blood flow to legs about 1 year ago. She has had recently medication change about 1 month ago by cardiology to stop amlodipine and resume hctz. She was having slight swelling in her ankles. She denies change in diet. Admits to perhaps not drinking as much fluids as she should. The pain is sometimes cramping in the legs. No true pattern. Does not always start with walking can just be sitting and it will start. Sometimes feels like she needs to move around but she is not sure if moving around helps the pain. Has tried mustard and takes magnesium daily and vinegar and a number of other remedies without resolution of the pain. Daughter is present and helps to provide history.   Review of Systems  Constitutional: Negative.   HENT: Negative.   Eyes: Negative.   Respiratory: Negative for cough, chest tightness and shortness of breath.   Cardiovascular: Negative for chest pain, palpitations and leg swelling.  Gastrointestinal: Negative for abdominal distention, abdominal pain, constipation, diarrhea, nausea and vomiting.  Musculoskeletal: Positive for myalgias.  Skin: Negative.   Neurological: Negative.   Psychiatric/Behavioral: Negative.     Objective:  Physical Exam Constitutional:      Appearance: She is well-developed.  HENT:     Head: Normocephalic and atraumatic.  Cardiovascular:     Rate and Rhythm: Normal rate and regular rhythm.  Pulmonary:     Effort: Pulmonary effort is normal. No respiratory distress.     Breath sounds: Normal breath sounds. No wheezing or rales.  Abdominal:     General: Bowel sounds are normal. There is no distension.     Palpations:  Abdomen is soft.     Tenderness: There is no abdominal tenderness. There is no rebound.  Musculoskeletal:     Cervical back: Normal range of motion.  Skin:    General: Skin is warm and dry.  Neurological:     Mental Status: She is alert and oriented to person, place, and time.     Coordination: Coordination normal.     Vitals:   03/02/20 1502  BP: 130/82  Pulse: 70  Temp: 98.5 F (36.9 C)  TempSrc: Oral  SpO2: 96%  Weight: 155 lb 6.4 oz (70.5 kg)    This visit occurred during the SARS-CoV-2 public health emergency.  Safety protocols were in place, including screening questions prior to the visit, additional usage of staff PPE, and extensive cleaning of exam room while observing appropriate contact time as indicated for disinfecting solutions.   Assessment & Plan:

## 2020-03-03 DIAGNOSIS — M545 Low back pain: Secondary | ICD-10-CM | POA: Diagnosis not present

## 2020-03-04 NOTE — Assessment & Plan Note (Signed)
Checking magnesium level. Reassurance given that blood flow is adequate as of last investigation. Advised that a nerve conduction test could be done to help assess for restless leg given the sensation she is having but they defer at this time. Will switch back to her prior 2.5 mg amlodipine dosing and stop hctz. Needs follow up with BP check with PCP.

## 2020-03-13 DIAGNOSIS — M545 Low back pain: Secondary | ICD-10-CM | POA: Diagnosis not present

## 2020-03-15 DIAGNOSIS — M79641 Pain in right hand: Secondary | ICD-10-CM | POA: Diagnosis not present

## 2020-03-15 DIAGNOSIS — Z79899 Other long term (current) drug therapy: Secondary | ICD-10-CM | POA: Diagnosis not present

## 2020-03-15 DIAGNOSIS — M79642 Pain in left hand: Secondary | ICD-10-CM | POA: Diagnosis not present

## 2020-03-15 DIAGNOSIS — R768 Other specified abnormal immunological findings in serum: Secondary | ICD-10-CM | POA: Diagnosis not present

## 2020-03-15 DIAGNOSIS — M545 Low back pain: Secondary | ICD-10-CM | POA: Diagnosis not present

## 2020-03-17 DIAGNOSIS — M545 Low back pain: Secondary | ICD-10-CM | POA: Diagnosis not present

## 2020-03-25 MED FILL — OLOPATADINE HCL 0.1 % SOLN: 0.1 | 25 days supply | Qty: 5 | Fill #2

## 2020-03-29 DIAGNOSIS — M65331 Trigger finger, right middle finger: Secondary | ICD-10-CM | POA: Diagnosis not present

## 2020-03-29 DIAGNOSIS — M159 Polyosteoarthritis, unspecified: Secondary | ICD-10-CM | POA: Diagnosis not present

## 2020-03-29 DIAGNOSIS — M25559 Pain in unspecified hip: Secondary | ICD-10-CM | POA: Diagnosis not present

## 2020-03-29 DIAGNOSIS — M48061 Spinal stenosis, lumbar region without neurogenic claudication: Secondary | ICD-10-CM | POA: Diagnosis not present

## 2020-03-29 DIAGNOSIS — G894 Chronic pain syndrome: Secondary | ICD-10-CM | POA: Diagnosis not present

## 2020-03-29 DIAGNOSIS — M810 Age-related osteoporosis without current pathological fracture: Secondary | ICD-10-CM | POA: Diagnosis not present

## 2020-03-29 DIAGNOSIS — G5601 Carpal tunnel syndrome, right upper limb: Secondary | ICD-10-CM | POA: Diagnosis not present

## 2020-03-29 DIAGNOSIS — M65321 Trigger finger, right index finger: Secondary | ICD-10-CM | POA: Diagnosis not present

## 2020-03-29 DIAGNOSIS — M79643 Pain in unspecified hand: Secondary | ICD-10-CM | POA: Diagnosis not present

## 2020-03-29 DIAGNOSIS — M79673 Pain in unspecified foot: Secondary | ICD-10-CM | POA: Diagnosis not present

## 2020-03-29 DIAGNOSIS — M25519 Pain in unspecified shoulder: Secondary | ICD-10-CM | POA: Diagnosis not present

## 2020-03-29 DIAGNOSIS — M461 Sacroiliitis, not elsewhere classified: Secondary | ICD-10-CM | POA: Diagnosis not present

## 2020-04-07 DIAGNOSIS — H5213 Myopia, bilateral: Secondary | ICD-10-CM | POA: Diagnosis not present

## 2020-04-07 DIAGNOSIS — H2513 Age-related nuclear cataract, bilateral: Secondary | ICD-10-CM | POA: Diagnosis not present

## 2020-04-07 DIAGNOSIS — H524 Presbyopia: Secondary | ICD-10-CM | POA: Diagnosis not present

## 2020-04-07 DIAGNOSIS — H25043 Posterior subcapsular polar age-related cataract, bilateral: Secondary | ICD-10-CM | POA: Diagnosis not present

## 2020-04-08 ENCOUNTER — Telehealth: Payer: Self-pay | Admitting: Internal Medicine

## 2020-04-08 DIAGNOSIS — R300 Dysuria: Secondary | ICD-10-CM

## 2020-04-08 NOTE — Telephone Encounter (Signed)
   Patient requesting order for urine test; painful urination Declined appointment

## 2020-04-08 NOTE — Telephone Encounter (Signed)
MD is out of the office this afternoon will hold for her response on tomorrow.Marland KitchenJohny Chess

## 2020-04-08 NOTE — Telephone Encounter (Signed)
Ok -- ordered for [employer]

## 2020-04-09 ENCOUNTER — Other Ambulatory Visit (INDEPENDENT_AMBULATORY_CARE_PROVIDER_SITE_OTHER): Payer: Medicare Other

## 2020-04-09 DIAGNOSIS — R300 Dysuria: Secondary | ICD-10-CM | POA: Diagnosis not present

## 2020-04-09 LAB — URINALYSIS, ROUTINE W REFLEX MICROSCOPIC
Bilirubin Urine: NEGATIVE
Hgb urine dipstick: NEGATIVE
Ketones, ur: NEGATIVE
Leukocytes,Ua: NEGATIVE
Nitrite: NEGATIVE
RBC / HPF: NONE SEEN (ref 0–?)
Specific Gravity, Urine: 1.01 (ref 1.000–1.030)
Total Protein, Urine: NEGATIVE
Urine Glucose: NEGATIVE
Urobilinogen, UA: 0.2 (ref 0.0–1.0)
pH: 5.5 (ref 5.0–8.0)

## 2020-04-09 NOTE — Addendum Note (Signed)
Addended by: Jacob Moores on: 04/09/2020 09:54 AM   Modules accepted: Orders

## 2020-04-09 NOTE — Telephone Encounter (Signed)
Notified pt w/MD response.../lmb 

## 2020-04-10 LAB — URINE CULTURE
MICRO NUMBER:: 10935336
Result:: NO GROWTH
SPECIMEN QUALITY:: ADEQUATE

## 2020-04-12 DIAGNOSIS — M545 Low back pain: Secondary | ICD-10-CM | POA: Diagnosis not present

## 2020-04-14 ENCOUNTER — Other Ambulatory Visit: Payer: Medicare Other | Admitting: *Deleted

## 2020-04-14 ENCOUNTER — Other Ambulatory Visit: Payer: Self-pay

## 2020-04-14 DIAGNOSIS — E785 Hyperlipidemia, unspecified: Secondary | ICD-10-CM | POA: Diagnosis not present

## 2020-04-14 DIAGNOSIS — E782 Mixed hyperlipidemia: Secondary | ICD-10-CM

## 2020-04-14 LAB — HEPATIC FUNCTION PANEL
ALT: 23 IU/L (ref 0–32)
AST: 27 IU/L (ref 0–40)
Albumin: 4.3 g/dL (ref 3.7–4.7)
Alkaline Phosphatase: 132 IU/L — ABNORMAL HIGH (ref 44–121)
Bilirubin Total: 0.3 mg/dL (ref 0.0–1.2)
Bilirubin, Direct: 0.1 mg/dL (ref 0.00–0.40)
Total Protein: 7 g/dL (ref 6.0–8.5)

## 2020-04-14 LAB — LIPID PANEL
Chol/HDL Ratio: 2.7 ratio (ref 0.0–4.4)
Cholesterol, Total: 166 mg/dL (ref 100–199)
HDL: 62 mg/dL (ref >39)
LDL Chol Calc (NIH): 71 mg/dL (ref 0–99)
Triglycerides: 204 mg/dL — ABNORMAL HIGH (ref 0–149)
VLDL Cholesterol Cal: 33 mg/dL (ref 5–40)

## 2020-04-19 NOTE — Progress Notes (Signed)
Subjective:    Patient ID: [name], female    DOB: [birth year], 74 y.o.   MRN: [id number]  HPI The patient is here for an acute visit for elevated alk phos.  She was advised by cardiology to follow-up regarding her elevated alkaline phosphatase.  She denies any upper abdominal pain.  She does have chronic lower abdominal pain that is cramping and she has been evaluated by GYN and GI.  Had Ct scan April 2021 - liver normal.  Biliary tree is normal.    Has seen a specialist for OP.   - has only osteopenia.     Her left upper and lower jaw hurts.  Two weeks ago she was bending over and she felt like a nail was being driven into her head down to her jaw .   Every time she swallowed she would feel it.  Rheum was going to refer to neuro.   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  We have discussed medication in the past and I have prescribed her something at one point, but she never took it.  She wonders if she should consider it and if it would help her.  Medications and allergies reviewed with patient and updated if appropriate.  Patient Active Problem List   Diagnosis Date Noted  . Swelling 12/19/2018  . Hair loss 11/04/2018  . Edema 11/04/2018  . Epistaxis 09/18/2018  . 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 Atypical chest pain 05/21/2018  . Epigastric pain 05/21/2018  . Baker's cyst of knee, right 05/07/2018  . Perennial and seasonal allergic rhinitis 04/18/2018  . History of food allergy 04/18/2018  . Onychomycosis of toenail 03/04/2018  . Hypercalcemia 09/17/2017  . Left wrist pain 09/17/2017  . Synovitis of left knee 05/22/2017  . Diabetes ([place]) 05/02/2017  . Trigger point of left shoulder region 03/27/2017  . Taste disorder 02/22/2017  . Anxiety 02/12/2017  . Caregiver with fatigue 01/17/2017  . Psychosocial stressors 01/17/2017  . Fatigue  01/15/2017  . Decreased sense of taste 01/15/2017  . At high risk for falls 11/16/2016  . Family history of osteoporosis 11/16/2016  . Atherosclerotic plaque 11/07/2016  . 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 Renal insufficiency 06/29/2016  . Osteoporosis 06/21/2016  . Dizziness 05/16/2016  . Arthritis of left shoulder region 01/17/2016  . Ankylosing spondylitis ([place]) 11/08/2015  . Incontinence 10/28/2015  . Thoracic back pain 10/19/2015  . Leg cramping 09/29/2015  . 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 Impingement syndrome of left shoulder region 09/22/2014  . Interstitial cystitis 07/09/2014  . Diverticulosis of colon without hemorrhage 07/08/2014  . Carpal tunnel syndrome of left wrist 04/12/2014  . Food allergy 03/06/2014  . Renal calculi 01/30/2014  . OSA (obstructive sleep apnea), moderate 01/14/2014  . Allergic conjunctivitis 12/06/2013  . History of Bell's palsy 02/26/2012  . Vitamin D deficiency 06/27/2010  . 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  . DRY MOUTH 06/27/2010  . ABDOMINAL PAIN, SUPRAPUBIC 05/25/2009  . Gastroesophageal reflux disease 04/30/2009  . History of colonic polyps 04/30/2009  . COSTOCHONDRITIS 01/15/2008  . HYPERLIPIDEMIA 08/21/2007  . Essential hypertension 08/21/2007  . Arthralgia 08/21/2007  . Chronic lumbar radiculopathy 01/25/2007    Current Outpatient Medications on File Prior to Visit  Medication Sig Dispense Refill  . acetaminophen (TYLENOL) 500 MG tablet Take 1,000 mg by mouth 3 (  three) times daily as needed.     Marland Kitchen amLODipine (NORVASC) 2.5 MG tablet Take 1 tablet (2.5 mg total) by mouth daily. 90 tablet 3  . Ascorbic Acid (VITAMIN C) 1000 MG tablet Take 1,000 mg by mouth 2 (two) times daily.     Marland Kitchen aspirin 81 MG tablet Take 81 mg by mouth daily.      Marland Kitchen azelastine (ASTELIN) 0.1 % nasal spray One to two sprays each nostril twice a day as needed. 30 mL 5  . blood glucose meter kit and  supplies KIT Dispense based on patient and insurance preference. Use up to four times daily as directed. prediabetes 1 each 0  . cetirizine (ZYRTEC) 10 MG tablet Take 10 mg by mouth daily.    . Cholecalciferol (VITAMIN D-3) 25 MCG (1000 UT) CAPS Take 10,000 Units by mouth daily.     . diclofenac Sodium (VOLTAREN) 1 % GEL Apply topically 4 (four) times daily as needed.     . dicyclomine (BENTYL) 10 MG capsule Take 1 capsule (10 mg total) by mouth 3 (three) times daily as needed for spasms. 90 capsule 2  . diphenoxylate-atropine (LOMOTIL) 2.5-0.025 MG tablet Take 1 tablet by mouth 4 (four) times daily as needed for diarrhea or loose stools. 90 tablet 2  . EPINEPHrine (EPIPEN 2-PAK) 0.3 mg/0.3 mL IJ SOAJ injection Use as directed for severe allergic reactions 2 each 1  . estradiol (ESTRACE) 0.1 MG/GM vaginal cream estradiol 0.01% (0.1 mg/gram) vaginal cream    . HYDROcodone-acetaminophen (NORCO/VICODIN) 5-325 MG tablet hydrocodone 5 mg-acetaminophen 325 mg tablet  TAKE 1 TABLET BY MOUTH THREE TIMES DAILY AS NEEDED FOR 7 DAYS    . levocetirizine (XYZAL) 5 MG tablet Take 1 tablet (5 mg total) by mouth daily as needed for allergies. 30 tablet 5  . lipase/protease/amylase (CREON) 36000 UNITS CPEP capsule Take 2 capsules by mouth with each meal, 1 capsule by mouth with each snack 720 capsule 1  . Magnesium 200 MG TABS Take 1 tablet (200 mg total) by mouth daily. 30 each 6  . Multiple Vitamins-Minerals (CENTRUM SILVER 50+WOMEN PO) Take 1 tablet by mouth daily.    . naloxone (NARCAN) 4 MG/0.1ML LIQD nasal spray kit Narcan 4 mg/actuation nasal spray  PLACE 1 SPRAY INTO EACH NOSTRIL FOR SUSPECTED OPIOID OVERDOSE. MAY REPEAT 2 - 3 MINUTES WITH NEW BOTTLE IN OTHER NOSTRIL    . olopatadine (PATANOL) 0.1 % ophthalmic solution Place 1 drop into both eyes 2 (two) times daily. 5 mL 12  . Omega-3 Fatty Acids (FISH OIL BURP-LESS) 1000 MG CAPS Takes 1000 mg BID 60 capsule 0  . omeprazole (PRILOSEC) 40 MG capsule TAKE 1  CAPSULE BY MOUTH ONCE DAILY 90 capsule 0  . ondansetron (ZOFRAN) 4 MG tablet Take 1 tablet (4 mg total) by mouth every 8 (eight) hours as needed. 40 tablet 2  . rosuvastatin (CRESTOR) 5 MG tablet Take 1 tablet by mouth 2X's a week 90 tablet 3  . vitamin B-12 (CYANOCOBALAMIN) 1000 MCG tablet Take 1,000 mcg by mouth 2 (two) times daily.     . vitamin E 400 UNIT capsule Take 400 Units by mouth daily.    Marland Kitchen zinc gluconate 50 MG tablet Take 50 mg by mouth daily.     No current facility-administered medications on file prior to visit.    Past Medical History:  Diagnosis Date  . Allergy    seasonal  . Ankylosing spondylitis ([place])   . Anxiety   . Arthritis   .  Bell palsy   . CAD (coronary artery disease)    a. Coronary CT 10/2016 showed calcium score in the 59th perecntile, moderate CAD in RCA/PDA and narrowing of the mid to distal LAD, with negative FFR.  . CTS (carpal tunnel syndrome)   . Cystitis   . Diverticulitis   . Diverticulosis of colon   . Dry eye syndrome   . Exocrine pancreatic insufficiency   . Frozen shoulder   . GERD (gastroesophageal reflux disease)   . HLA B27 (HLA B27 positive)   . Hx of colonic polyps   . Hyperlipidemia    NMR 2005  . Hypertension   . Hypothyroidism   . Incontinence   . Internal hemorrhoids   . Interstitial cystitis   . Kidney stone   . Menopause   . Mild aortic insufficiency   . Mild ascending aorta dilatation ([facility])   . Osteoarthritis   . Osteopenia    [facility] [place]   . S/P total hysterectomy and bilateral salpingo-oophorectomy 04/12/2014  . Sinusitis   . Sleep apnea    c-pap  . Subjective visual disturbance of both eyes 12/30/2013  . Tubular adenoma of colon   . Vitamin D deficiency   . Xerostomia    and Xeroophthalmia    Past Surgical History:  Procedure Laterality Date  . BLADDER SURGERY     for incontinence  . CARPAL TUNNEL RELEASE Left 12/22/2014   Procedure: LEFT CARPAL TUNNEL RELEASE;   Surgeon: Daryll Brod, MD;  Location: [facility];  Service: Orthopedics;  Laterality: Left;  . CARPAL TUNNEL RELEASE Right 02/18/2019   Procedure: RIGHT CARPAL TUNNEL RELEASE;  Surgeon: Daryll Brod, MD;  Location: [facility];  Service: Orthopedics;  Laterality: Right;  . COLONOSCOPY W/ POLYPECTOMY     adenomatous poyp 04-2008  . colonscopy     tics 10-2002  . CYSTOSCOPY WITH RETROGRADE PYELOGRAM, URETEROSCOPY AND STENT PLACEMENT Left 09/01/2013   Procedure: CYSTOSCOPY WITH RETROGRADE PYELOGRAM, AND LEFT STENT PLACEMENT;  Surgeon: Molli Hazard, MD;  Location: [facility];  Service: Urology;  Laterality: Left;  . elevated LFTs     due to [place]  . FINGER SURGERY Left 2004   2nd finger  . G 1 P 1    . interstitial cystitis     with clot in catheter  . PARTIAL HYSTERECTOMY     with one ovary left  . SHOULDER ARTHROSCOPY Left   . TOTAL ABDOMINAL HYSTERECTOMY W/ BILATERAL SALPINGOOPHORECTOMY     dysfunctional menses    Social History   Socioeconomic History  . Marital status: Married    Spouse name: Not on file  . Number of children: 1  . Years of education: BS  . Highest education level: Not on file  Occupational History  . Occupation: retired  . Occupation: Pharmacist, hospital    Comment: Kindergarten  Tobacco Use  . Smoking status: Never Smoker  . Smokeless tobacco: Never Used  Vaping Use  . Vaping Use: Never used  Substance and Sexual Activity  . Alcohol use: Yes    Comment: social  . Drug use: No  . Sexual activity: Not Currently  Other Topics Concern  . Not on file  Social History Narrative   Lives   Caffeine use:    Married      Social Determinants of Health   Financial Resource Strain:   . Difficulty of Paying Living Expenses: Not on file  Food Insecurity:   .  Worried About Charity fundraiser in the Last Year: Not on file  . Ran Out of Food in the Last Year: Not on file  Transportation Needs:   . Lack of Transportation (Medical): Not on  file  . Lack of Transportation (Non-Medical): Not on file  Physical Activity:   . Days of Exercise per Week: Not on file  . Minutes of Exercise per Session: Not on file  Stress:   . Feeling of Stress : Not on file  Social Connections:   . Frequency of Communication with Friends and Family: Not on file  . Frequency of Social Gatherings with Friends and Family: Not on file  . Attends Religious Services: Not on file  . Active Member of Clubs or Organizations: Not on file  . Attends Archivist Meetings: Not on file  . Marital Status: Not on file    Family History  Problem Relation Age of Onset  . Coronary artery disease Mother   . Hypertension Mother   . Asthma Mother   . Dementia Mother   . Stroke Mother        mini cva  . Diabetes Father   . Cirrhosis Father        non alcoholic  . Colon cancer Sister 76  . Allergic rhinitis Sister   . Allergic rhinitis Sister   . Food Allergy Sister   . Renal cancer Paternal Aunt        x 2  . Coronary artery disease Maternal Grandfather   . Breast cancer Maternal Aunt   . Diabetes Paternal Aunt   . Diabetes Paternal Grandmother   . Esophageal cancer Neg Hx   . Stomach cancer Neg Hx     Review of Systems  Constitutional: Negative for chills and fever.  Gastrointestinal: Positive for abdominal pain (intermittent abd cramping), anal bleeding (hemorrhoidal) and constipation. Negative for diarrhea and nausea.       Gerd controlled  Musculoskeletal: Positive for arthralgias and back pain.  Neurological: Positive for headaches.  Psychiatric/Behavioral: Positive for dysphoric mood. The patient is nervous/anxious.        Objective:   Vitals:   04/20/20 0939  BP: 130/86  Pulse: 67  Temp: 98.1 F (36.7 C)  SpO2: 98%   BP Readings from Last 3 Encounters:  04/20/20 130/86  03/02/20 130/82  02/03/20 120/82   Wt Readings from Last 3 Encounters:  04/20/20 158 lb (71.7 kg)  03/02/20 155 lb 6.4 oz (70.5 kg)  02/03/20 153  lb 6.4 oz (69.6 kg)   Body mass index is 28.9 kg/m.   Physical Exam    Constitutional: Appears well-developed and well-nourished. No distress.  Abdomen: Soft, nontender, nondistended Skin: Skin is warm and dry. Not diaphoretic.  Psychiatric: Normal mood and affect. Behavior is normal.       Assessment & Plan:    See Problem List for Assessment and Plan of chronic medical problems.    This visit occurred during the SARS-CoV-2 public health emergency.  Safety protocols were in place, including screening questions prior to the visit, additional usage of staff PPE, and extensive cleaning of exam room while observing appropriate contact time as indicated for disinfecting solutions.

## 2020-04-20 ENCOUNTER — Encounter: Payer: Self-pay | Admitting: Internal Medicine

## 2020-04-20 ENCOUNTER — Ambulatory Visit (INDEPENDENT_AMBULATORY_CARE_PROVIDER_SITE_OTHER): Payer: Medicare Other | Admitting: Internal Medicine

## 2020-04-20 ENCOUNTER — Other Ambulatory Visit: Payer: Self-pay | Admitting: Internal Medicine

## 2020-04-20 ENCOUNTER — Other Ambulatory Visit: Payer: Self-pay

## 2020-04-20 VITALS — BP 130/86 | HR 67 | Temp 98.1°F | Ht 62.0 in | Wt 158.0 lb

## 2020-04-20 DIAGNOSIS — F419 Anxiety disorder, unspecified: Secondary | ICD-10-CM | POA: Diagnosis not present

## 2020-04-20 DIAGNOSIS — I2510 Atherosclerotic heart disease of native coronary artery without angina pectoris: Secondary | ICD-10-CM | POA: Diagnosis not present

## 2020-04-20 DIAGNOSIS — R748 Abnormal levels of other serum enzymes: Secondary | ICD-10-CM

## 2020-04-20 DIAGNOSIS — M792 Neuralgia and neuritis, unspecified: Secondary | ICD-10-CM

## 2020-04-20 DIAGNOSIS — Z23 Encounter for immunization: Secondary | ICD-10-CM

## 2020-04-20 DIAGNOSIS — F329 Major depressive disorder, single episode, unspecified: Secondary | ICD-10-CM | POA: Diagnosis not present

## 2020-04-20 DIAGNOSIS — F32A Depression, unspecified: Secondary | ICD-10-CM

## 2020-04-20 MED ORDER — SERTRALINE HCL 25 MG PO TABS: 25.0000 mg | ORAL_TABLET | Freq: Every day | ORAL | 1 refills | Status: DC

## 2020-04-20 MED FILL — SERTRALINE HCL 25 MG TABLET: 25 | 90 days supply | Qty: 90 | Fill #0

## 2020-04-20 NOTE — Assessment & Plan Note (Signed)
Acute on chronic Related to caregiver stress and fatigue She would benefit from medication-trial of sertraline 25 mg daily.  We will increase this if well tolerated and she feels she needs a higher dose Discussed that it may help her to talk to her therapist as well Follow-up in 4-6 weeks

## 2020-04-20 NOTE — Assessment & Plan Note (Signed)
Acute Has had 3 elevated alkaline phosphatase in the past 6 months or so Denies upper abdominal pain, blood in your stool, diarrhea or nausea.  Has chronic constipation and lower abdominal intermittent cramping which she has seen GI and GYN for Has a history of osteoporosis-currently more osteopenia Has seen a specialist for this and no treatment is thought to be needed at this time Other liver enzymes within normal limits We will get blood work at her next visit since she will come for follow-up so we can do complete blood work Check right upper quadrant abdominal ultrasound Further evaluation based on blood work and ultrasound results

## 2020-04-20 NOTE — Patient Instructions (Addendum)
Flu immunization administered today.    Medications reviewed and updated.  Changes include :   Sertraline 25 mg daily  Your prescription(s) have been submitted to your pharmacy. Please take as directed and contact our office if you believe you are having problem(s) with the medication(s).  A referral was ordered for Neurology - Dr Jannifer Franklin.    Someone from their office will call you to schedule an appointment.   An Ultrasound was ordered.     Please followup in 4-6 weeks.

## 2020-04-20 NOTE — Assessment & Plan Note (Signed)
Acute Having left-sided head pain that sounds like nerve pain She would like to see Dr. Jannifer Franklin, who she has seen in the past We will order referral

## 2020-04-21 DIAGNOSIS — Z1231 Encounter for screening mammogram for malignant neoplasm of breast: Secondary | ICD-10-CM | POA: Diagnosis not present

## 2020-04-26 ENCOUNTER — Ambulatory Visit
Admission: RE | Admit: 2020-04-26 | Discharge: 2020-04-26 | Disposition: A | Discharge status: Home / Self Care | Payer: Medicare Other | Source: Ambulatory Visit | Attending: Internal Medicine | Admitting: Internal Medicine

## 2020-04-26 DIAGNOSIS — R748 Abnormal levels of other serum enzymes: Secondary | ICD-10-CM

## 2020-04-29 MED FILL — ONDANSETRON HCL 4 MG TABLET: 4 | 13 days supply | Qty: 40 | Fill #2

## 2020-05-10 ENCOUNTER — Other Ambulatory Visit: Payer: Self-pay | Admitting: Internal Medicine

## 2020-05-10 ENCOUNTER — Ambulatory Visit: Payer: Self-pay

## 2020-05-10 ENCOUNTER — Other Ambulatory Visit: Payer: Self-pay

## 2020-05-10 ENCOUNTER — Encounter: Payer: Self-pay | Admitting: Family Medicine

## 2020-05-10 ENCOUNTER — Telehealth: Payer: Self-pay | Admitting: Family Medicine

## 2020-05-10 ENCOUNTER — Ambulatory Visit (INDEPENDENT_AMBULATORY_CARE_PROVIDER_SITE_OTHER): Payer: Medicare Other | Admitting: Family Medicine

## 2020-05-10 ENCOUNTER — Ambulatory Visit (INDEPENDENT_AMBULATORY_CARE_PROVIDER_SITE_OTHER): Payer: Medicare Other

## 2020-05-10 ENCOUNTER — Ambulatory Visit (HOSPITAL_COMMUNITY)
Admission: RE | Admit: 2020-05-10 | Discharge: 2020-05-10 | Disposition: A | Discharge status: Home / Self Care | Payer: Medicare Other | Source: Ambulatory Visit | Attending: Family Medicine | Admitting: Family Medicine

## 2020-05-10 VITALS — BP 150/84 | HR 74 | Ht 62.0 in

## 2020-05-10 DIAGNOSIS — M2550 Pain in unspecified joint: Secondary | ICD-10-CM | POA: Diagnosis not present

## 2020-05-10 DIAGNOSIS — M25571 Pain in right ankle and joints of right foot: Secondary | ICD-10-CM | POA: Insufficient documentation

## 2020-05-10 DIAGNOSIS — M25471 Effusion, right ankle: Secondary | ICD-10-CM | POA: Insufficient documentation

## 2020-05-10 DIAGNOSIS — I2510 Atherosclerotic heart disease of native coronary artery without angina pectoris: Secondary | ICD-10-CM | POA: Diagnosis not present

## 2020-05-10 DIAGNOSIS — K219 Gastro-esophageal reflux disease without esophagitis: Secondary | ICD-10-CM

## 2020-05-10 LAB — SEDIMENTATION RATE: Sed Rate: 14 mm/hr (ref 0–30)

## 2020-05-10 LAB — CBC WITH DIFFERENTIAL/PLATELET
Basophils Absolute: 0 10*3/uL (ref 0.0–0.1)
Basophils Relative: 0.4 % (ref 0.0–3.0)
Eosinophils Absolute: 0.1 10*3/uL (ref 0.0–0.7)
Eosinophils Relative: 0.5 % (ref 0.0–5.0)
HCT: 35.1 % — ABNORMAL LOW (ref 36.0–46.0)
Hemoglobin: 11.9 g/dL — ABNORMAL LOW (ref 12.0–15.0)
Lymphocytes Relative: 13.5 % (ref 12.0–46.0)
Lymphs Abs: 1.4 10*3/uL (ref 0.7–4.0)
MCHC: 33.9 g/dL (ref 30.0–36.0)
MCV: 88.5 fl (ref 78.0–100.0)
Monocytes Absolute: 0.7 10*3/uL (ref 0.1–1.0)
Monocytes Relative: 7.1 % (ref 3.0–12.0)
Neutro Abs: 8.3 10*3/uL — ABNORMAL HIGH (ref 1.4–7.7)
Neutrophils Relative %: 78.5 % — ABNORMAL HIGH (ref 43.0–77.0)
Platelets: 234 10*3/uL (ref 150.0–400.0)
RBC: 3.96 Mil/uL (ref 3.87–5.11)
RDW: 13.5 % (ref 11.5–15.5)
WBC: 10.5 10*3/uL (ref 4.0–10.5)

## 2020-05-10 LAB — URIC ACID: Uric Acid, Serum: 3.9 mg/dL (ref 2.4–7.0)

## 2020-05-10 LAB — D-DIMER, QUANTITATIVE: D-Dimer, Quant: 0.53 mcg/mL FEU — ABNORMAL HIGH (ref <0.50)

## 2020-05-10 MED ORDER — PREDNISONE 20 MG PO TABS: 40.0000 mg | ORAL_TABLET | Freq: Two times a day (BID) | ORAL | 0 refills | Status: DC

## 2020-05-10 MED FILL — predniSONE 20 MG TABS: 20 | 7 days supply | Qty: 28 | Fill #0

## 2020-05-10 MED FILL — OMEPRAZOLE 40 MG CPDR: 40 | 90 days supply | Qty: 90 | Fill #0

## 2020-05-10 NOTE — Assessment & Plan Note (Signed)
Patient does have what appears to be a right ankle effusion.  Does not remember any true injury.  Will rule out any type of clot with D-dimer as well as Doppler ultrasound.  CBC as well for any infectious etiology, uric acid with potential for gout.  We discussed the possibility of aspiration but patient wanted to hold at this time.  Patient will do a cam walker as well as crutches as tolerated.  Patient's x-rays are still pending.  Once again patient declined aspiration.  Follow-up again in 7 to 10 days

## 2020-05-10 NOTE — Addendum Note (Signed)
Addended by: Cresenciano Lick on: 05/10/2020 11:09 AM   Modules accepted: Orders

## 2020-05-10 NOTE — Telephone Encounter (Signed)
V[facility] and [facility] called stating that the patient was negative for DVT and superficial thrombosis. They did see a small bakers cyst behind her right knee but do not believe that is what would be causing her issues.

## 2020-05-10 NOTE — Patient Instructions (Addendum)
Good to see you [name] 38 for the next 7 days Labs today Xray today [place].  See me again next week

## 2020-05-10 NOTE — Progress Notes (Signed)
[facility] [address] [place] Phone: [phone] Subjective:   I [name] am serving as a Education administrator for Dr. Hulan Saas.  This visit occurred during the SARS-CoV-2 public health emergency.  Safety protocols were in place, including screening questions prior to the visit, additional usage of staff PPE, and extensive cleaning of exam room while observing appropriate contact time as indicated for disinfecting solutions.   I'm seeing this patient by the request  of:  Binnie Rail, MD  CC: Right ankle swelling  GYK:[id number]   08/01/2018 Patient has failed all conservative therapy at this time.  Does not want any more injections I do not feel that would be beneficial.  Patient is having some radicular pain going down the arm I think secondary to her trying to compensate.  I do feel due to the pain that is never completely away that surgical intervention with a possible replacement is a possibility and encourage patient to follow-up with orthopedic surgeon specialist to discuss.    05/10/2020 [name] is a 74 y.o. female coming in with complaint of right ankle pain. Ankle is TTP. States she couldn't stand on her foot. Patient states she hasn't done anything differently. Ankle is swollen. Patient does not have a history of gout. ROM is limited. Pain is radiated up the leg.  Noticed it initially when she was sleeping.  States that it was about 5 AM.  Then attempted to walk and started having increasing discomfort and pain and swelling since then.  Onset- Acute  Location - lateral ankle  Duration-  Character-aching sensation that is severe Aggravating factors- Eversion, walking  Reliving factors-not moving Therapies tried-nothing Severity- 9/10 at its worse      Past Medical History:  Diagnosis Date  . Allergy    seasonal  . Ankylosing spondylitis ([place])   . Anxiety   . Arthritis   . Bell palsy   . CAD (coronary artery  disease)    a. Coronary CT 10/2016 showed calcium score in the 59th perecntile, moderate CAD in RCA/PDA and narrowing of the mid to distal LAD, with negative FFR.  . CTS (carpal tunnel syndrome)   . Cystitis   . Diverticulitis   . Diverticulosis of colon   . Dry eye syndrome   . Exocrine pancreatic insufficiency   . Frozen shoulder   . GERD (gastroesophageal reflux disease)   . HLA B27 (HLA B27 positive)   . Hx of colonic polyps   . Hyperlipidemia    NMR 2005  . Hypertension   . Hypothyroidism   . Incontinence   . Internal hemorrhoids   . Interstitial cystitis   . Kidney stone   . Menopause   . Mild aortic insufficiency   . Mild ascending aorta dilatation ([facility])   . Osteoarthritis   . Osteopenia    [facility] [place]   . S/P total hysterectomy and bilateral salpingo-oophorectomy 04/12/2014  . Sinusitis   . Sleep apnea    c-pap  . Subjective visual disturbance of both eyes 12/30/2013  . Tubular adenoma of colon   . Vitamin D deficiency   . Xerostomia    and Xeroophthalmia   Past Surgical History:  Procedure Laterality Date  . BLADDER SURGERY     for incontinence  . CARPAL TUNNEL RELEASE Left 12/22/2014   Procedure: LEFT CARPAL TUNNEL RELEASE;  Surgeon: Daryll Brod, MD;  Location: [facility];  Service: Orthopedics;  Laterality: Left;  . CARPAL TUNNEL RELEASE Right 02/18/2019   Procedure: RIGHT CARPAL TUNNEL RELEASE;  Surgeon: Daryll Brod, MD;  Location: [facility];  Service: Orthopedics;  Laterality: Right;  . COLONOSCOPY W/ POLYPECTOMY     adenomatous poyp 04-2008  . colonscopy     tics 10-2002  . CYSTOSCOPY WITH RETROGRADE PYELOGRAM, URETEROSCOPY AND STENT PLACEMENT Left 09/01/2013   Procedure: CYSTOSCOPY WITH RETROGRADE PYELOGRAM, AND LEFT STENT PLACEMENT;  Surgeon: Molli Hazard, MD;  Location: [facility];  Service: Urology;  Laterality: Left;  . elevated LFTs     due to Elmiron  . FINGER SURGERY Left 2004     2nd finger  . G 1 P 1    . interstitial cystitis     with clot in catheter  . PARTIAL HYSTERECTOMY     with one ovary left  . SHOULDER ARTHROSCOPY Left   . TOTAL ABDOMINAL HYSTERECTOMY W/ BILATERAL SALPINGOOPHORECTOMY     dysfunctional menses   Social History   Socioeconomic History  . Marital status: Married    Spouse name: Not on file  . Number of children: 1  . Years of education: BS  . Highest education level: Not on file  Occupational History  . Occupation: retired  . Occupation: Pharmacist, hospital    Comment: Kindergarten  Tobacco Use  . Smoking status: Never Smoker  . Smokeless tobacco: Never Used  Vaping Use  . Vaping Use: Never used  Substance and Sexual Activity  . Alcohol use: Yes    Comment: social  . Drug use: No  . Sexual activity: Not Currently  Other Topics Concern  . Not on file  Social History Narrative   Lives   Caffeine use:    Married      Social Determinants of Health   Financial Resource Strain:   . Difficulty of Paying Living Expenses: Not on file  Food Insecurity:   . Worried About Charity fundraiser in the Last Year: Not on file  . Ran Out of Food in the Last Year: Not on file  Transportation Needs:   . Lack of Transportation (Medical): Not on file  . Lack of Transportation (Non-Medical): Not on file  Physical Activity:   . Days of Exercise per Week: Not on file  . Minutes of Exercise per Session: Not on file  Stress:   . Feeling of Stress : Not on file  Social Connections:   . Frequency of Communication with Friends and Family: Not on file  . Frequency of Social Gatherings with Friends and Family: Not on file  . Attends Religious Services: Not on file  . Active Member of Clubs or Organizations: Not on file  . Attends Archivist Meetings: Not on file  . Marital Status: Not on file   Allergies  Allergen Reactions  . Bactrim [Sulfamethoxazole-Trimethoprim] Other (See Comments)  . Nitroglycerin Anaphylaxis    Hypotensive  after NTG administration in ER during chest pain evaluation Pt states she almost died,  Pt states in a procedure done in 10/2016 she had no reaction to this medication, so not sure if it is an actual allergy.   . Pentosan Polysulfate Other (See Comments)    Elevated liver count Elevated liver enzymes Elevated LFTs.......Marland Kitchen elmiron   . Pentosan Polysulfate Sodium     Elevated LFTs.......Marland Kitchen elmiron   . Promethazine Hcl Other (See Comments)    "jittery on the inside"  . Sulfamethoxazole-Trimethoprim Nausea Only    Unknown  reaction per pt, Pt thinks she may have felt sick to her stomach.   . Losartan Other (See Comments)    Pt states caused insomnia, tingling sensation in legs, lightheaded, stomach pain  . Promethazine Hcl   . Lisinopril Cough   Family History  Problem Relation Age of Onset  . Coronary artery disease Mother   . Hypertension Mother   . Asthma Mother   . Dementia Mother   . Stroke Mother        mini cva  . Diabetes Father   . Cirrhosis Father        non alcoholic  . Colon cancer Sister 28  . Allergic rhinitis Sister   . Allergic rhinitis Sister   . Food Allergy Sister   . Renal cancer Paternal Aunt        x 2  . Coronary artery disease Maternal Grandfather   . Breast cancer Maternal Aunt   . Diabetes Paternal Aunt   . Diabetes Paternal Grandmother   . Esophageal cancer Neg Hx   . Stomach cancer Neg Hx     Current Outpatient Medications (Endocrine & Metabolic):  .  predniSONE (DELTASONE) 20 MG tablet, Take 2 tablets (40 mg total) by mouth 2 (two) times daily.  Current Outpatient Medications (Cardiovascular):  .  amLODipine (NORVASC) 2.5 MG tablet, Take 1 tablet (2.5 mg total) by mouth daily. Marland Kitchen  EPINEPHrine (EPIPEN 2-PAK) 0.3 mg/0.3 mL IJ SOAJ injection, Use as directed for severe allergic reactions .  rosuvastatin (CRESTOR) 5 MG tablet, Take 1 tablet by mouth 2X's a week  Current Outpatient Medications (Respiratory):  .  azelastine (ASTELIN) 0.1 % nasal  spray, One to two sprays each nostril twice a day as needed. .  cetirizine (ZYRTEC) 10 MG tablet, Take 10 mg by mouth daily. Marland Kitchen  levocetirizine (XYZAL) 5 MG tablet, Take 1 tablet (5 mg total) by mouth daily as needed for allergies.  Current Outpatient Medications (Analgesics):  .  acetaminophen (TYLENOL) 500 MG tablet, Take 1,000 mg by mouth 3 (three) times daily as needed.  Marland Kitchen  aspirin 81 MG tablet, Take 81 mg by mouth daily.   Marland Kitchen  HYDROcodone-acetaminophen (NORCO/VICODIN) 5-325 MG tablet, hydrocodone 5 mg-acetaminophen 325 mg tablet  TAKE 1 TABLET BY MOUTH THREE TIMES DAILY AS NEEDED FOR 7 DAYS  Current Outpatient Medications (Hematological):  .  vitamin B-12 (CYANOCOBALAMIN) 1000 MCG tablet, Take 1,000 mcg by mouth 2 (two) times daily.   Current Outpatient Medications (Other):  Marland Kitchen  Ascorbic Acid (VITAMIN C) 1000 MG tablet, Take 1,000 mg by mouth 2 (two) times daily.  .  blood glucose meter kit and supplies KIT, Dispense based on patient and insurance preference. Use up to four times daily as directed. prediabetes .  Cholecalciferol (VITAMIN D-3) 25 MCG (1000 UT) CAPS, Take 10,000 Units by mouth daily.  .  diclofenac Sodium (VOLTAREN) 1 % GEL, Apply topically 4 (four) times daily as needed.  .  dicyclomine (BENTYL) 10 MG capsule, Take 1 capsule (10 mg total) by mouth 3 (three) times daily as needed for spasms. .  diphenoxylate-atropine (LOMOTIL) 2.5-0.025 MG tablet, Take 1 tablet by mouth 4 (four) times daily as needed for diarrhea or loose stools. Marland Kitchen  estradiol (ESTRACE) 0.1 MG/GM vaginal cream, estradiol 0.01% (0.1 mg/gram) vaginal cream .  lipase/protease/amylase (CREON) 36000 UNITS CPEP capsule, Take 2 capsules by mouth with each meal, 1 capsule by mouth with each snack .  Magnesium 200 MG TABS, Take 1 tablet (200 mg total) by mouth  daily. .  Multiple Vitamins-Minerals (CENTRUM SILVER 50+WOMEN PO), Take 1 tablet by mouth daily. .  naloxone (NARCAN) 4 MG/0.1ML LIQD nasal spray kit, Narcan 4  mg/actuation nasal spray  PLACE 1 SPRAY INTO EACH NOSTRIL FOR SUSPECTED OPIOID OVERDOSE. MAY REPEAT 2 - 3 MINUTES WITH NEW BOTTLE IN OTHER NOSTRIL .  olopatadine (PATANOL) 0.1 % ophthalmic solution, Place 1 drop into both eyes 2 (two) times daily. .  Omega-3 Fatty Acids (FISH OIL BURP-LESS) 1000 MG CAPS, Takes 1000 mg BID .  omeprazole (PRILOSEC) 40 MG capsule, TAKE 1 CAPSULE BY MOUTH ONCE DAILY .  ondansetron (ZOFRAN) 4 MG tablet, Take 1 tablet (4 mg total) by mouth every 8 (eight) hours as needed. .  sertraline (ZOLOFT) 25 MG tablet, Take 1 tablet (25 mg total) by mouth daily. .  vitamin E 400 UNIT capsule, Take 400 Units by mouth daily. Marland Kitchen  zinc gluconate 50 MG tablet, Take 50 mg by mouth daily.   Reviewed prior external information including notes and imaging from  primary care provider As well as notes that were available from care everywhere and other healthcare systems.  Past medical history, social, surgical and family history all reviewed in electronic medical record.  No pertanent information unless stated regarding to the chief complaint.   Review of Systems:  No headache, visual changes, nausea, vomiting, diarrhea, constipation, dizziness, abdominal pain, skin rash, fevers, chills, night sweats, weight loss, swollen lymph nodes, body aches, joint swelling, chest pain, shortness of breath, mood changes. POSITIVE muscle aches  Objective  Blood pressure (!) 150/84, pulse 74, height 5' 2"  (1.575 m), SpO2 97 %.   General: Seems very uncomfortable HEENT: Pupils equal, extraocular movements intact  Respiratory: Patient's speak in full sentences and does not appear short of breath  Gait unable to bear weight MSK: Patient's right ankle with significant effusion noted.  Warm to touch.  No skin breakdown of the skin.  Patient does have some mild pain with compression of the calf noted.  Neurovascularly intact distally.  Decreased range of motion secondary to voluntary guarding.   Limited  musculoskeletal ultrasound was performed and interpreted by Lyndal Pulley  Limited ultrasound of patient's right ankle shows that patient does have a large effusion noted.  Some mild overlying inflammation noted of the soft tissues.  Seems to be more secondary to the stress.  No sign of any true uric acid deposits at the moment.  On the anterior lateral and posterior lateral blood flow seems to be compressible. Impression: Nonspecific large joint effusion of the ankle   Impression and Recommendations:     The above documentation has been reviewed and is accurate and complete Lyndal Pulley, DO

## 2020-05-17 ENCOUNTER — Ambulatory Visit (INDEPENDENT_AMBULATORY_CARE_PROVIDER_SITE_OTHER): Payer: Medicare Other | Admitting: Family Medicine

## 2020-05-17 ENCOUNTER — Ambulatory Visit: Payer: Self-pay

## 2020-05-17 ENCOUNTER — Encounter: Payer: Self-pay | Admitting: Family Medicine

## 2020-05-17 ENCOUNTER — Other Ambulatory Visit: Payer: Self-pay

## 2020-05-17 VITALS — BP 128/74 | HR 69 | Ht 62.0 in

## 2020-05-17 DIAGNOSIS — M25571 Pain in right ankle and joints of right foot: Secondary | ICD-10-CM | POA: Diagnosis not present

## 2020-05-17 DIAGNOSIS — M25471 Effusion, right ankle: Secondary | ICD-10-CM | POA: Diagnosis not present

## 2020-05-17 DIAGNOSIS — I2510 Atherosclerotic heart disease of native coronary artery without angina pectoris: Secondary | ICD-10-CM | POA: Diagnosis not present

## 2020-05-17 DIAGNOSIS — E119 Type 2 diabetes mellitus without complications: Secondary | ICD-10-CM | POA: Diagnosis not present

## 2020-05-17 DIAGNOSIS — E1369 Other specified diabetes mellitus with other specified complication: Secondary | ICD-10-CM

## 2020-05-17 NOTE — Assessment & Plan Note (Signed)
Referred to nutrition

## 2020-05-17 NOTE — Assessment & Plan Note (Signed)
Completely resolved at this time. Responding well to the prednisone. We discussed the potential still for gout but I do think that nutrition is a good starting point for him. Discussed icing regimen. Patient will still add on left side avoiding the cam walker for another week and then increase slowly. Follow-up again 4 to 6 weeks.

## 2020-05-17 NOTE — Patient Instructions (Signed)
Error on side of boot outside of house for 1 week Make daughter Raynelle Bring lawn one time Spell alphabet when sitting Have follow up in 4-5 weeks

## 2020-05-17 NOTE — Progress Notes (Signed)
Zach  D.O. [facility] [address] [place] [address] Phone: [phone] Subjective:   I, [name], am serving as a scribe for Dr.  . This visit occurred during the SARS-CoV-2 public health emergency.  Safety protocols were in place, including screening questions prior to the visit, additional usage of staff PPE, and extensive cleaning of exam room while observing appropriate contact time as indicated for disinfecting solutions.   I'm seeing this patient by the request  of:  Burns, Stacy J, MD  CC: Right ankle pain follow-up  HPI:Subjective   05/10/2020 Patient does have what appears to be a right ankle effusion.  Does not remember any true injury.  Will rule out any type of clot with D-dimer as well as Doppler ultrasound.  CBC as well for any infectious etiology, uric acid with potential for gout.  We discussed the possibility of aspiration but patient wanted to hold at this time.  Patient will do a cam walker as well as crutches as tolerated.  Patient's x-rays are still pending.  Once again patient declined aspiration.  Follow-up again in 7 to 10 days  Update 10/18/22021 [name] is a 74 y.o. female coming in with complaint of right ankle pain. Patient has been wearing boot since last visit. No pain over lateral malleolus. Slight intermittent pain over superior talus. Patient states doing significantly better at this time. Able to walk okay. Patient would like some improvement with her diabetes. Patient would like to lose weight and feels like that would help her ankle as well.    Patient did have x-rays at last exam showing there was a possible OCD noted of the medial aspect of the talus.  No acute fracture noted Doppler was done unremarkable for DVT  Past Medical History:  Diagnosis Date  . Allergy    seasonal  . Ankylosing spondylitis (HCC)   . Anxiety   . Arthritis   . Bell palsy   . CAD (coronary artery disease)    a. Coronary CT  10/2016 showed calcium score in the 59th perecntile, moderate CAD in RCA/PDA and narrowing of the mid to distal LAD, with negative FFR.  . CTS (carpal tunnel syndrome)   . Cystitis   . Diverticulitis   . Diverticulosis of colon   . Dry eye syndrome   . Exocrine pancreatic insufficiency   . Frozen shoulder   . GERD (gastroesophageal reflux disease)   . HLA B27 (HLA B27 positive)   . Hx of colonic polyps   . Hyperlipidemia    NMR 2005  . Hypertension   . Hypothyroidism   . Incontinence   . Internal hemorrhoids   . Interstitial cystitis   . Kidney stone   . Menopause   . Mild aortic insufficiency   . Mild ascending aorta dilatation (HCC)   . Osteoarthritis   . Osteopenia    [facility] [address]  . Pre-diabetes   . S/P total hysterectomy and bilateral salpingo-oophorectomy 04/12/2014  . Sinusitis   . Sleep apnea    c-pap  . Subjective visual disturbance of both eyes 12/30/2013  . Tubular adenoma of colon   . Vitamin D deficiency   . Xerostomia    and Xeroophthalmia   Past Surgical History:  Procedure Laterality Date  . BLADDER SURGERY     for incontinence  . CARPAL TUNNEL RELEASE Left 12/22/2014   Procedure: LEFT CARPAL TUNNEL RELEASE;  Surgeon: Gary Kuzma, MD;  Location:    SURGERY CENTER;  Service: Orthopedics;  Laterality: Left;  . CARPAL TUNNEL RELEASE Right 02/18/2019   Procedure: RIGHT CARPAL TUNNEL RELEASE;  Surgeon: Kuzma, Gary, MD;  Location: [facility];  Service: Orthopedics;  Laterality: Right;  . COLONOSCOPY W/ POLYPECTOMY     adenomatous poyp 04-2008  . colonscopy     tics 10-2002  . CYSTOSCOPY WITH RETROGRADE PYELOGRAM, URETEROSCOPY AND STENT PLACEMENT Left 09/01/2013   Procedure: CYSTOSCOPY WITH RETROGRADE PYELOGRAM, AND LEFT STENT PLACEMENT;  Surgeon: Daniel Young Woodruff, MD;  Location: [facility];  Service: Urology;  Laterality: Left;  . elevated LFTs     due to Elmiron  . FINGER SURGERY Left 2004   2nd finger  . G 1 P 1      . interstitial cystitis     with clot in catheter  . PARTIAL HYSTERECTOMY     with one ovary left  . SHOULDER ARTHROSCOPY Left   . TOTAL ABDOMINAL HYSTERECTOMY W/ BILATERAL SALPINGOOPHORECTOMY     dysfunctional menses   Social History   Socioeconomic History  . Marital status: Married    Spouse name: Not on file  . Number of children: 1  . Years of education: BS  . Highest education level: Not on file  Occupational History  . Occupation: retired  . Occupation: teacher    Comment: Kindergarten  Tobacco Use  . Smoking status: Never Smoker  . Smokeless tobacco: Never Used  Vaping Use  . Vaping Use: Never used  Substance and Sexual Activity  . Alcohol use: Yes    Comment: social  . Drug use: No  . Sexual activity: Not Currently  Other Topics Concern  . Not on file  Social History Narrative   Lives   Caffeine use:    Married      Social Determinants of Health   Financial Resource Strain:   . Difficulty of Paying Living Expenses: Not on file  Food Insecurity:   . Worried About Running Out of Food in the Last Year: Not on file  . Ran Out of Food in the Last Year: Not on file  Transportation Needs:   . Lack of Transportation (Medical): Not on file  . Lack of Transportation (Non-Medical): Not on file  Physical Activity:   . Days of Exercise per Week: Not on file  . Minutes of Exercise per Session: Not on file  Stress:   . Feeling of Stress : Not on file  Social Connections:   . Frequency of Communication with Friends and Family: Not on file  . Frequency of Social Gatherings with Friends and Family: Not on file  . Attends Religious Services: Not on file  . Active Member of Clubs or Organizations: Not on file  . Attends Club or Organization Meetings: Not on file  . Marital Status: Not on file   Allergies  Allergen Reactions  . Bactrim [Sulfamethoxazole-Trimethoprim] Other (See Comments)  . Nitroglycerin Anaphylaxis    Hypotensive after NTG administration in ER  during chest pain evaluation Pt states she almost died,  Pt states in a procedure done in 10/2016 she had no reaction to this medication, so not sure if it is an actual allergy.   . Pentosan Polysulfate Other (See Comments)    Elevated liver count Elevated liver enzymes Elevated LFTs........ elmiron   . Pentosan Polysulfate Sodium     Elevated LFTs........ elmiron   . Promethazine Hcl Other (See Comments)    "jittery on the inside"  . Sulfamethoxazole-Trimethoprim Nausea Only      Unknown reaction per pt, Pt thinks she may have felt sick to her stomach.   . Losartan Other (See Comments)    Pt states caused insomnia, tingling sensation in legs, lightheaded, stomach pain  . Promethazine Hcl   . Lisinopril Cough   Family History  Problem Relation Age of Onset  . Coronary artery disease Mother   . Hypertension Mother   . Asthma Mother   . Dementia Mother   . Stroke Mother        mini cva  . Diabetes Father   . Cirrhosis Father        non alcoholic  . Colon cancer Sister 75  . Allergic rhinitis Sister   . Allergic rhinitis Sister   . Food Allergy Sister   . Renal cancer Paternal Aunt        x 2  . Coronary artery disease Maternal Grandfather   . Breast cancer Maternal Aunt   . Diabetes Paternal Aunt   . Diabetes Paternal Grandmother   . Esophageal cancer Neg Hx   . Stomach cancer Neg Hx     Current Outpatient Medications (Endocrine & Metabolic):  .  predniSONE (DELTASONE) 20 MG tablet, Take 2 tablets (40 mg total) by mouth 2 (two) times daily.  Current Outpatient Medications (Cardiovascular):  .  amLODipine (NORVASC) 2.5 MG tablet, Take 1 tablet (2.5 mg total) by mouth daily. Marland Kitchen  EPINEPHrine (EPIPEN 2-PAK) 0.3 mg/0.3 mL IJ SOAJ injection, Use as directed for severe allergic reactions .  rosuvastatin (CRESTOR) 5 MG tablet, Take 1 tablet by mouth 2X's a week  Current Outpatient Medications (Respiratory):  .  azelastine (ASTELIN) 0.1 % nasal spray, One to two sprays each  nostril twice a day as needed. .  cetirizine (ZYRTEC) 10 MG tablet, Take 10 mg by mouth daily. Marland Kitchen  levocetirizine (XYZAL) 5 MG tablet, Take 1 tablet (5 mg total) by mouth daily as needed for allergies.  Current Outpatient Medications (Analgesics):  .  acetaminophen (TYLENOL) 500 MG tablet, Take 1,000 mg by mouth 3 (three) times daily as needed.  Marland Kitchen  aspirin 81 MG tablet, Take 81 mg by mouth daily.   Marland Kitchen  HYDROcodone-acetaminophen (NORCO/VICODIN) 5-325 MG tablet, hydrocodone 5 mg-acetaminophen 325 mg tablet  TAKE 1 TABLET BY MOUTH THREE TIMES DAILY AS NEEDED FOR 7 DAYS  Current Outpatient Medications (Hematological):  .  vitamin B-12 (CYANOCOBALAMIN) 1000 MCG tablet, Take 1,000 mcg by mouth 2 (two) times daily.   Current Outpatient Medications (Other):  Marland Kitchen  Ascorbic Acid (VITAMIN C) 1000 MG tablet, Take 1,000 mg by mouth 2 (two) times daily.  .  blood glucose meter kit and supplies KIT, Dispense based on patient and insurance preference. Use up to four times daily as directed. prediabetes .  Cholecalciferol (VITAMIN D-3) 25 MCG (1000 UT) CAPS, Take 10,000 Units by mouth daily.  .  diclofenac Sodium (VOLTAREN) 1 % GEL, Apply topically 4 (four) times daily as needed.  .  dicyclomine (BENTYL) 10 MG capsule, Take 1 capsule (10 mg total) by mouth 3 (three) times daily as needed for spasms. .  diphenoxylate-atropine (LOMOTIL) 2.5-0.025 MG tablet, Take 1 tablet by mouth 4 (four) times daily as needed for diarrhea or loose stools. Marland Kitchen  estradiol (ESTRACE) 0.1 MG/GM vaginal cream, estradiol 0.01% (0.1 mg/gram) vaginal cream .  lipase/protease/amylase (CREON) 36000 UNITS CPEP capsule, Take 2 capsules by mouth with each meal, 1 capsule by mouth with each snack .  Magnesium 200 MG TABS, Take 1 tablet (200 mg total) by  mouth daily. .  Multiple Vitamins-Minerals (CENTRUM SILVER 50+WOMEN PO), Take 1 tablet by mouth daily. .  naloxone (NARCAN) 4 MG/0.1ML LIQD nasal spray kit, Narcan 4 mg/actuation nasal spray  PLACE  1 SPRAY INTO EACH NOSTRIL FOR SUSPECTED OPIOID OVERDOSE. MAY REPEAT 2 - 3 MINUTES WITH NEW BOTTLE IN OTHER NOSTRIL .  olopatadine (PATANOL) 0.1 % ophthalmic solution, Place 1 drop into both eyes 2 (two) times daily. .  Omega-3 Fatty Acids (FISH OIL BURP-LESS) 1000 MG CAPS, Takes 1000 mg BID .  omeprazole (PRILOSEC) 40 MG capsule, TAKE 1 CAPSULE BY MOUTH ONCE DAILY .  ondansetron (ZOFRAN) 4 MG tablet, Take 1 tablet (4 mg total) by mouth every 8 (eight) hours as needed. .  sertraline (ZOLOFT) 25 MG tablet, Take 1 tablet (25 mg total) by mouth daily. .  vitamin E 400 UNIT capsule, Take 400 Units by mouth daily. .  zinc gluconate 50 MG tablet, Take 50 mg by mouth daily.   Reviewed prior external information including notes and imaging from  primary care provider As well as notes that were available from care everywhere and other healthcare systems.  Past medical history, social, surgical and family history all reviewed in electronic medical record.  No pertanent information unless stated regarding to the chief complaint.   Review of Systems:  No headache, visual changes, nausea, vomiting, diarrhea, constipation, dizziness, abdominal pain, skin rash, fevers, chills, night sweats, weight loss, swollen lymph nodes, body aches, joint swelling, chest pain, shortness of breath, mood changes. POSITIVE muscle aches  Objective  Blood pressure 128/74, pulse 69, height 5' 2" (1.575 m), SpO2 98 %.   General: No apparent distress alert and oriented x3 mood and affect normal, dressed appropriately.  HEENT: Pupils equal, extraocular movements intact  Respiratory: Patient's speak in full sentences and does not appear short of breath  Cardiovascular: No lower extremity edema, non tender, no erythema  Neuro: Cranial nerves II through XII are intact, neurovascularly intact in all extremities with 2+ DTRs and 2+ pulses.  Gait normal with good balance and coordination.  MSK: Arthritic changes of multiple  joints Patient's right ankle has significant decrease in the swelling at this time. Still some mild dorsiflexion of the ankle noted. Neurovascularly intact distally.  Limited musculoskeletal ultrasound was performed and interpreted by  M   Limited ultrasound of patient's right ankle shows the patient does have arthritic changes noted. Still some irregularity of the talar dome noted. Patient does not have any of the effusion no noted. Neuro abnormality on the lateral malleolus. Impression: Underlying ankle arthritis but resolved effusion    Impression and Recommendations:     The above documentation has been reviewed and is accurate and complete  M , DO   

## 2020-05-18 ENCOUNTER — Ambulatory Visit: Payer: Medicare Other | Admitting: Internal Medicine

## 2020-05-27 ENCOUNTER — Other Ambulatory Visit (HOSPITAL_BASED_OUTPATIENT_CLINIC_OR_DEPARTMENT_OTHER): Payer: Self-pay | Admitting: Nurse Practitioner

## 2020-05-27 DIAGNOSIS — N958 Other specified menopausal and perimenopausal disorders: Secondary | ICD-10-CM | POA: Diagnosis not present

## 2020-05-27 DIAGNOSIS — N3946 Mixed incontinence: Secondary | ICD-10-CM | POA: Diagnosis not present

## 2020-05-27 MED FILL — ESTRADIOL 0.1 MG/GM CRM: 0.1 | 90 days supply | Qty: 43 | Fill #2

## 2020-05-28 MED FILL — MYRBETRIQ ER 25 MG TABLET: 25 | 30 days supply | Qty: 30 | Fill #0

## 2020-05-31 DIAGNOSIS — M5451 Vertebrogenic low back pain: Secondary | ICD-10-CM | POA: Diagnosis not present

## 2020-05-31 NOTE — Progress Notes (Signed)
Subjective:    Patient ID: [name], female    DOB: July 07, 1946, 74 y.o.   MRN: [id number]  HPI The patient is here for follow up of their chronic medical problems, including anxiety/stress/depression, elevated alk phos, right sided head pain/neuralgia  4 weeks ago started on sertraline 25 mg daily.  She did not start taking it.  She is worried about side effects and "needing it" when she feels she should be able to deal with everything on her own.    An US of the liver was normal on 04/26/20.    She is trying to do better with her sugar intake.  She is trying to walk some, but is not exercising regularly.   Medications and allergies reviewed with patient and updated if appropriate.  Patient Active Problem List   Diagnosis Date Noted  . Right ankle effusion 05/10/2020  . Elevated alkaline phosphatase level 04/20/2020  . Neuralgia 04/20/2020  . Swelling 12/19/2018  . 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 Atypical chest pain 05/21/2018  . Epigastric pain 05/21/2018  . Baker's cyst of knee, right 05/07/2018  . Perennial and seasonal allergic rhinitis 04/18/2018  . Onychomycosis of toenail 03/04/2018  . Hypercalcemia 09/17/2017  . Left wrist pain 09/17/2017  . Synovitis of left knee 05/22/2017  . Diabetes ([place]) 05/02/2017  . Trigger point of left shoulder region 03/27/2017  . Taste disorder 02/22/2017  . Anxiety and depression 02/12/2017  . Caregiver with fatigue 01/17/2017  . Psychosocial stressors 01/17/2017  . Decreased sense of taste 01/15/2017  . At high risk for falls 11/16/2016  . Family history of osteoporosis 11/16/2016  . Atherosclerotic plaque 11/07/2016  . 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 Renal insufficiency 06/29/2016  . Osteoporosis 06/21/2016  . Dizziness 05/16/2016  . Arthritis of left shoulder region 01/17/2016  . Ankylosing spondylitis ([place]) 11/08/2015  . Incontinence  10/28/2015  . Thoracic back pain 10/19/2015  . Leg cramping 09/29/2015  . 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 Impingement syndrome of left shoulder region 09/22/2014  . Interstitial cystitis 07/09/2014  . Diverticulosis of colon without hemorrhage 07/08/2014  . Carpal tunnel syndrome of left wrist 04/12/2014  . Food allergy 03/06/2014  . Renal calculi 01/30/2014  . OSA (obstructive sleep apnea), moderate 01/14/2014  . Allergic conjunctivitis 12/06/2013  . History of Bell's palsy 02/26/2012  . Vitamin D deficiency 06/27/2010  . 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  . DRY MOUTH 06/27/2010  . ABDOMINAL PAIN, SUPRAPUBIC 05/25/2009  . Gastroesophageal reflux disease 04/30/2009  . History of colonic polyps 04/30/2009  . COSTOCHONDRITIS 01/15/2008  . HYPERLIPIDEMIA 08/21/2007  . Essential hypertension 08/21/2007  . Arthralgia 08/21/2007  . Chronic lumbar radiculopathy 01/25/2007    Current Outpatient Medications on File Prior to Visit  Medication Sig Dispense Refill  . acetaminophen (TYLENOL) 500 MG tablet Take 1,000 mg by mouth 3 (three) times daily as needed.     Marland Kitchen amLODipine (NORVASC) 2.5 MG tablet Take 1 tablet (2.5 mg total) by mouth daily. 90 tablet 3  . Ascorbic Acid (VITAMIN C) 1000 MG tablet Take 1,000 mg by mouth 2 (two) times daily.     Marland Kitchen aspirin 81 MG tablet Take 81 mg by mouth daily.      Marland Kitchen azelastine (ASTELIN) 0.1 % nasal spray One to two sprays each nostril twice a day as needed. 30 mL 5  . blood glucose meter kit and supplies KIT Dispense based  on patient and insurance preference. Use up to four times daily as directed. prediabetes 1 each 0  . cetirizine (ZYRTEC) 10 MG tablet Take 10 mg by mouth daily.    . Cholecalciferol (VITAMIN D-3) 25 MCG (1000 UT) CAPS Take 10,000 Units by mouth daily.     . diclofenac Sodium (VOLTAREN) 1 % GEL Apply topically 4 (four) times daily as needed.     . dicyclomine (BENTYL) 10 MG capsule Take 1 capsule (10 mg total) by mouth 3  (three) times daily as needed for spasms. 90 capsule 2  . diphenoxylate-atropine (LOMOTIL) 2.5-0.025 MG tablet Take 1 tablet by mouth 4 (four) times daily as needed for diarrhea or loose stools. 90 tablet 2  . EPINEPHrine (EPIPEN 2-PAK) 0.3 mg/0.3 mL IJ SOAJ injection Use as directed for severe allergic reactions 2 each 1  . estradiol (ESTRACE) 0.1 MG/GM vaginal cream estradiol 0.01% (0.1 mg/gram) vaginal cream    . HYDROcodone-acetaminophen (NORCO/VICODIN) 5-325 MG tablet hydrocodone 5 mg-acetaminophen 325 mg tablet  TAKE 1 TABLET BY MOUTH THREE TIMES DAILY AS NEEDED FOR 7 DAYS    . levocetirizine (XYZAL) 5 MG tablet Take 1 tablet (5 mg total) by mouth daily as needed for allergies. 30 tablet 5  . lipase/protease/amylase (CREON) 36000 UNITS CPEP capsule Take 2 capsules by mouth with each meal, 1 capsule by mouth with each snack 720 capsule 1  . Magnesium 200 MG TABS Take 1 tablet (200 mg total) by mouth daily. 30 each 6  . Multiple Vitamins-Minerals (CENTRUM SILVER 50+WOMEN PO) Take 1 tablet by mouth daily.    Marland Kitchen MYRBETRIQ 25 MG TB24 tablet Take 25 mg by mouth daily.    . naloxone (NARCAN) 4 MG/0.1ML LIQD nasal spray kit Narcan 4 mg/actuation nasal spray  PLACE 1 SPRAY INTO EACH NOSTRIL FOR SUSPECTED OPIOID OVERDOSE. MAY REPEAT 2 - 3 MINUTES WITH NEW BOTTLE IN OTHER NOSTRIL    . olopatadine (PATANOL) 0.1 % ophthalmic solution Place 1 drop into both eyes 2 (two) times daily. 5 mL 12  . Omega-3 Fatty Acids (FISH OIL BURP-LESS) 1000 MG CAPS Takes 1000 mg BID 60 capsule 0  . omeprazole (PRILOSEC) 40 MG capsule TAKE 1 CAPSULE BY MOUTH ONCE DAILY 90 capsule 2  . ondansetron (ZOFRAN) 4 MG tablet Take 1 tablet (4 mg total) by mouth every 8 (eight) hours as needed. 40 tablet 2  . rosuvastatin (CRESTOR) 5 MG tablet Take 1 tablet by mouth 2X's a week 90 tablet 3  . sertraline (ZOLOFT) 25 MG tablet Take 1 tablet (25 mg total) by mouth daily. 90 tablet 1  . vitamin B-12 (CYANOCOBALAMIN) 1000 MCG tablet Take  1,000 mcg by mouth 2 (two) times daily.     . vitamin E 400 UNIT capsule Take 400 Units by mouth daily.    Marland Kitchen zinc gluconate 50 MG tablet Take 50 mg by mouth daily.     No current facility-administered medications on file prior to visit.    Past Medical History:  Diagnosis Date  . Allergy    seasonal  . Ankylosing spondylitis ([place])   . Anxiety   . Arthritis   . Bell palsy   . CAD (coronary artery disease)    a. Coronary CT 10/2016 showed calcium score in the 59th perecntile, moderate CAD in RCA/PDA and narrowing of the mid to distal LAD, with negative FFR.  . CTS (carpal tunnel syndrome)   . Cystitis   . Diverticulitis   . Diverticulosis of colon   . Dry  eye syndrome   . Exocrine pancreatic insufficiency   . Frozen shoulder   . GERD (gastroesophageal reflux disease)   . HLA B27 (HLA B27 positive)   . Hx of colonic polyps   . Hyperlipidemia    NMR 2005  . Hypertension   . Hypothyroidism   . Incontinence   . Internal hemorrhoids   . Interstitial cystitis   . Kidney stone   . Menopause   . Mild aortic insufficiency   . Mild ascending aorta dilatation ([facility])   . Osteoarthritis   . Osteopenia    [facility] [place]   . S/P total hysterectomy and bilateral salpingo-oophorectomy 04/12/2014  . Sinusitis   . Sleep apnea    c-pap  . Subjective visual disturbance of both eyes 12/30/2013  . Tubular adenoma of colon   . Vitamin D deficiency   . Xerostomia    and Xeroophthalmia    Past Surgical History:  Procedure Laterality Date  . BLADDER SURGERY     for incontinence  . CARPAL TUNNEL RELEASE Left 12/22/2014   Procedure: LEFT CARPAL TUNNEL RELEASE;  Surgeon: Daryll Brod, MD;  Location: [facility];  Service: Orthopedics;  Laterality: Left;  . CARPAL TUNNEL RELEASE Right 02/18/2019   Procedure: RIGHT CARPAL TUNNEL RELEASE;  Surgeon: Daryll Brod, MD;  Location: [facility];  Service: Orthopedics;  Laterality: Right;  .  COLONOSCOPY W/ POLYPECTOMY     adenomatous poyp 04-2008  . colonscopy     tics 10-2002  . CYSTOSCOPY WITH RETROGRADE PYELOGRAM, URETEROSCOPY AND STENT PLACEMENT Left 09/01/2013   Procedure: CYSTOSCOPY WITH RETROGRADE PYELOGRAM, AND LEFT STENT PLACEMENT;  Surgeon: Molli Hazard, MD;  Location: [facility];  Service: Urology;  Laterality: Left;  . elevated LFTs     due to [place]  . FINGER SURGERY Left 2004   2nd finger  . G 1 P 1    . interstitial cystitis     with clot in catheter  . PARTIAL HYSTERECTOMY     with one ovary left  . SHOULDER ARTHROSCOPY Left   . TOTAL ABDOMINAL HYSTERECTOMY W/ BILATERAL SALPINGOOPHORECTOMY     dysfunctional menses    Social History   Socioeconomic History  . Marital status: Married    Spouse name: Not on file  . Number of children: 1  . Years of education: BS  . Highest education level: Not on file  Occupational History  . Occupation: retired  . Occupation: Pharmacist, hospital    Comment: Kindergarten  Tobacco Use  . Smoking status: Never Smoker  . Smokeless tobacco: Never Used  Vaping Use  . Vaping Use: Never used  Substance and Sexual Activity  . Alcohol use: Yes    Comment: social  . Drug use: No  . Sexual activity: Not Currently  Other Topics Concern  . Not on file  Social History Narrative   Lives   Caffeine use:    Married      Social Determinants of Health   Financial Resource Strain:   . Difficulty of Paying Living Expenses: Not on file  Food Insecurity:   . Worried About Charity fundraiser in the Last Year: Not on file  . Ran Out of Food in the Last Year: Not on file  Transportation Needs:   . Lack of Transportation (Medical): Not on file  . Lack of Transportation (Non-Medical): Not on file  Physical Activity:   . Days of Exercise per Week: Not  on file  . Minutes of Exercise per Session: Not on file  Stress:   . Feeling of Stress : Not on file  Social Connections:   . Frequency of Communication with Friends and Family:  Not on file  . Frequency of Social Gatherings with Friends and Family: Not on file  . Attends Religious Services: Not on file  . Active Member of Clubs or Organizations: Not on file  . Attends Archivist Meetings: Not on file  . Marital Status: Not on file    Family History  Problem Relation Age of Onset  . Coronary artery disease Mother   . Hypertension Mother   . Asthma Mother   . Dementia Mother   . Stroke Mother        mini cva  . Diabetes Father   . Cirrhosis Father        non alcoholic  . Colon cancer Sister 68  . Allergic rhinitis Sister   . Allergic rhinitis Sister   . Food Allergy Sister   . Renal cancer Paternal Aunt        x 2  . Coronary artery disease Maternal Grandfather   . Breast cancer Maternal Aunt   . Diabetes Paternal Aunt   . Diabetes Paternal Grandmother   . Esophageal cancer Neg Hx   . Stomach cancer Neg Hx     Review of Systems  Constitutional: Negative for fever.  Respiratory: Negative for cough, shortness of breath and wheezing.   Cardiovascular: Positive for palpitations (occ, in throat). Negative for chest pain and leg swelling.  Musculoskeletal: Positive for arthralgias and back pain.  Neurological: Positive for headaches.       Objective:   Vitals:   06/01/20 1436  BP: 140/86  Pulse: 80  Temp: 98.3 F (36.8 C)  SpO2: 97%   BP Readings from Last 3 Encounters:  06/01/20 140/86  05/17/20 128/74  05/10/20 (!) 150/84   Wt Readings from Last 3 Encounters:  06/01/20 152 lb (68.9 kg)  04/20/20 158 lb (71.7 kg)  03/02/20 155 lb 6.4 oz (70.5 kg)   Body mass index is 27.8 kg/m.   Physical Exam    Constitutional: Appears well-developed and well-nourished. No distress.  HENT:  Head: Normocephalic and atraumatic.  Neck: Neck supple. No tracheal deviation present. No thyromegaly present.  No cervical lymphadenopathy Cardiovascular: Normal rate, regular rhythm and normal heart sounds.   No murmur heard. No carotid bruit .   No edema Pulmonary/Chest: Effort normal and breath sounds normal. No respiratory distress. No has no wheezes. No rales.  Skin: Skin is warm and dry. Not diaphoretic.  Psychiatric: Normal mood and affect. Behavior is normal.      Assessment & Plan:    See Problem List for Assessment and Plan of chronic medical problems.    This visit occurred during the SARS-CoV-2 public health emergency.  Safety protocols were in place, including screening questions prior to the visit, additional usage of staff PPE, and extensive cleaning of exam room while observing appropriate contact time as indicated for disinfecting solutions.

## 2020-05-31 NOTE — Patient Instructions (Addendum)
  Blood work was ordered.     Medications reviewed and updated.  Changes include :   none    Please followup in 6 months

## 2020-06-01 ENCOUNTER — Encounter: Payer: Self-pay | Admitting: Internal Medicine

## 2020-06-01 ENCOUNTER — Ambulatory Visit: Payer: Medicare Other | Admitting: Internal Medicine

## 2020-06-01 ENCOUNTER — Other Ambulatory Visit: Payer: Self-pay

## 2020-06-01 ENCOUNTER — Ambulatory Visit (INDEPENDENT_AMBULATORY_CARE_PROVIDER_SITE_OTHER): Payer: Medicare Other | Admitting: Internal Medicine

## 2020-06-01 VITALS — BP 140/86 | HR 80 | Temp 98.3°F | Ht 62.0 in | Wt 152.0 lb

## 2020-06-01 DIAGNOSIS — I2510 Atherosclerotic heart disease of native coronary artery without angina pectoris: Secondary | ICD-10-CM

## 2020-06-01 DIAGNOSIS — F419 Anxiety disorder, unspecified: Secondary | ICD-10-CM

## 2020-06-01 DIAGNOSIS — I10 Essential (primary) hypertension: Secondary | ICD-10-CM

## 2020-06-01 DIAGNOSIS — E119 Type 2 diabetes mellitus without complications: Secondary | ICD-10-CM | POA: Diagnosis not present

## 2020-06-01 DIAGNOSIS — F32A Depression, unspecified: Secondary | ICD-10-CM

## 2020-06-01 DIAGNOSIS — R748 Abnormal levels of other serum enzymes: Secondary | ICD-10-CM

## 2020-06-01 LAB — COMPREHENSIVE METABOLIC PANEL
ALT: 24 U/L (ref 0–35)
AST: 24 U/L (ref 0–37)
Albumin: 4.3 g/dL (ref 3.5–5.2)
Alkaline Phosphatase: 116 U/L (ref 39–117)
BUN: 18 mg/dL (ref 6–23)
CO2: 28 mEq/L (ref 19–32)
Calcium: 9.3 mg/dL (ref 8.4–10.5)
Chloride: 105 mEq/L (ref 96–112)
Creatinine, Ser: 0.98 mg/dL (ref 0.40–1.20)
GFR: 56.14 mL/min — ABNORMAL LOW (ref >60.00)
Glucose, Bld: 100 mg/dL — ABNORMAL HIGH (ref 70–99)
Potassium: 4 mEq/L (ref 3.5–5.1)
Sodium: 141 mEq/L (ref 135–145)
Total Bilirubin: 0.3 mg/dL (ref 0.2–1.2)
Total Protein: 7.3 g/dL (ref 6.0–8.3)

## 2020-06-01 LAB — MICROALBUMIN / CREATININE URINE RATIO
Creatinine,U: 139.9 mg/dL
Microalb Creat Ratio: 1.1 mg/g (ref 0.0–30.0)
Microalb, Ur: 1.5 mg/dL (ref 0.0–1.9)

## 2020-06-01 LAB — HEMOGLOBIN A1C: Hgb A1c MFr Bld: 6.4 % (ref 4.6–6.5)

## 2020-06-01 NOTE — Assessment & Plan Note (Signed)
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

## 2020-06-01 NOTE — Assessment & Plan Note (Addendum)
Chronic Diet controlled Encouraged regular exercise and diabetic diet Check a1c, urine microalbumin

## 2020-06-01 NOTE — Assessment & Plan Note (Signed)
Subacute cmp today Abdominal US normal.

## 2020-06-01 NOTE — Assessment & Plan Note (Signed)
Chronic BP well controlled Continue amlodipine 2.5 mg daily,  cmp  

## 2020-06-04 ENCOUNTER — Ambulatory Visit: Payer: Medicare Other | Attending: Internal Medicine

## 2020-06-04 ENCOUNTER — Other Ambulatory Visit (HOSPITAL_BASED_OUTPATIENT_CLINIC_OR_DEPARTMENT_OTHER): Payer: Self-pay | Admitting: Internal Medicine

## 2020-06-04 DIAGNOSIS — Z23 Encounter for immunization: Secondary | ICD-10-CM

## 2020-06-10 ENCOUNTER — Encounter: Payer: Medicare Other | Attending: Family Medicine | Admitting: Registered"

## 2020-06-10 ENCOUNTER — Other Ambulatory Visit: Payer: Self-pay

## 2020-06-10 ENCOUNTER — Encounter: Payer: Self-pay | Admitting: Registered"

## 2020-06-10 DIAGNOSIS — E119 Type 2 diabetes mellitus without complications: Secondary | ICD-10-CM | POA: Diagnosis not present

## 2020-06-10 MED FILL — PFIZER-BIONTECH COVID-19 VA: 30 | 1 days supply | Qty: 0 | Fill #0

## 2020-06-10 NOTE — Progress Notes (Signed)
   Covid-19 Vaccination Clinic  Name:  [name]    MRN: [id number] DOB: [birth year]  06/10/2020  [name] was observed post Covid-19 immunization for 15 minutes without incident. She was provided with Vaccine Information Sheet and instruction to access the V-Safe system.   [name] was instructed to call 911 with any severe reactions post vaccine: Marland Kitchen Difficulty breathing  . Swelling of face and throat  . A fast heartbeat  . A bad rash all over body  . Dizziness and weakness

## 2020-06-10 NOTE — Patient Instructions (Signed)
Goals:  Follow Diabetes Meal Plan as instructed  Eat 3 meals and 2 snacks, every 3-5 hrs  Aim for 1/2 plate non-starchy vegetables + 1/4 plate starch/grains + 1/4 plate protein with lunch and dinner.   Add lean protein foods to meals/snacks  Aim for 30 mins of physical activity daily

## 2020-06-10 NOTE — Progress Notes (Signed)
Diabetes Self-Management Education  Visit Type:  First/Initial  Appt. Start Time: 4:00 Appt. End Time: 5:20  06/16/2020  [name], identified by name and date of birth, is a 74 y.o. female with a diagnosis of Diabetes: Type 2.   ASSESSMENT  States dad had diabetes but did not take medications to manage. States he was older when diagnosed. States she has other paternal family members diagnosed with diabetes. States she believes they were not taking medications for diabetes either.   States she weighed 152 lbs and wants to weigh less. Wants to eat healthier. States she is interested in drinking meals. States she is not a Careers adviser; will eat chicken, [place].   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   States she notices after she eats a meal, 45 minutes later she gets very sleepy. Reports sleep apnea and uses CPAP machine. Sleeps 6+ hrs/night. States it varies, some nights are better than others.  States she does not like water. Drinks unsweetened tea.   Reports she was walking with daughter 2-3 times/week. No longer walking with a schedule. States she walks in 5 minute increments throughout the day with ADLs.   Pt expectations: wants to know how to eat healthier  There were no vitals taken for this visit. There is no height or weight on file to calculate BMI.    Diabetes Self-Management Education - 06/10/20 1611      Health Coping   How would you rate your overall health? Good      Psychosocial Assessment   Patient Belief/Attitude about Diabetes Other (comment)   states she does not want it   Self-care barriers None    Self-management support Family;Doctor's office    Special Needs None    Preferred Learning Style No preference indicated    Learning Readiness Change in progress      Complications   Last HgB A1C per patient/outside source 6.4 %    How often do you check your blood sugar? 0 times/day (not testing)     Number of hypoglycemic episodes per month 1    Can you tell when your blood sugar is low? No    What do you do if your blood sugar is low? drink OJ or eat toast/crackers + tea    Number of hyperglycemic episodes per week 2    Can you tell when your blood sugar is high? No    Have you had a dilated eye exam in the past 12 months? Yes    Have you had a dental exam in the past 12 months? Yes    Are you checking your feet? Yes    How many days per week are you checking your feet? 5      Dietary Intake   Breakfast sometimes skips or 1 slice of toast with cheese or coffee    Lunch sometimes skips    Snack (afternoon) 2 chocolate chip cookies    Dinner filet of fish + salad (vegetables, apples) with vinargrette + mashed potaotes + water    Snack (evening) sometimes fruit or ice cream or dessert (brownie)    Beverage(s) tea, water (1.5*16 oz: 24 oz), coffee (sometimes), soda (sometimes), sparkling water (12 oz)      Exercise   Exercise Type Light (walking / raking leaves)    How many days per week to you exercise? 3    How many minutes per day do you exercise?  20    Total minutes per week of exercise 60      Patient Education   Previous Diabetes Education No    Disease state  Factors that contribute to the development of diabetes;Definition of diabetes, type 1 and 2, and the diagnosis of diabetes    Nutrition management  Role of diet in the treatment of diabetes and the relationship between the three main macronutrients and blood glucose level;Food label reading, portion sizes and measuring food.;Reviewed blood glucose goals for pre and post meals and how to evaluate the patients' food intake on their blood glucose level.    Physical activity and exercise  Role of exercise on diabetes management, blood pressure control and cardiac health.    Medications Reviewed patients medication for diabetes, action, purpose, timing of dose and side effects.    Monitoring Purpose and frequency of  SMBG.;Identified appropriate SMBG and/or A1C goals.;Daily foot exams;Yearly dilated eye exam;Interpreting lab values - A1C, lipid, urine microalbumina.;Taught/discussed recording of test results and interpretation of SMBG.    Acute complications Taught treatment of hypoglycemia - the 15 rule.;Discussed and identified patients' treatment of hyperglycemia.    Chronic complications Applicable immunizations;Lipid levels, blood glucose control and heart disease;Relationship between chronic complications and blood glucose control;Retinopathy and reason for yearly dilated eye exams;Nephropathy, what it is, prevention of, the use of ACE, ARB's and early detection of through urine microalbumia.;Reviewed with patient heart disease, higher risk of, and prevention;Assessed and discussed foot care and prevention of foot problems;Identified and discussed with patient  current chronic complications    Psychosocial adjustment Role of stress on diabetes    Personal strategies to promote health Lifestyle issues that need to be addressed for better diabetes care      Individualized Goals (developed by patient)   Nutrition General guidelines for healthy choices and portions discussed    Physical Activity Exercise 3-5 times per week;30 minutes per day    Medications take my medication as prescribed    Reducing Risk treat hypoglycemia with 15 grams of carbs if blood glucose less than 70mg /dL      Post-Education Assessment   Patient understands the diabetes disease and treatment process. Demonstrates understanding / competency    Patient understands incorporating nutritional management into lifestyle. Demonstrates understanding / competency    Patient undertands incorporating physical activity into lifestyle. Demonstrates understanding / competency    Patient understands using medications safely. Demonstrates understanding / competency    Patient understands monitoring blood glucose, interpreting and using results  Demonstrates understanding / competency    Patient understands prevention, detection, and treatment of acute complications. Demonstrates understanding / competency    Patient understands prevention, detection, and treatment of chronic complications. Demonstrates understanding / competency    Patient understands how to develop strategies to address psychosocial issues. Demonstrates understanding / competency    Patient understands how to develop strategies to promote health/change behavior. Demonstrates understanding / competency      Outcomes   Program Status Completed           Learning Objective:  Patient will have a greater understanding of diabetes self-management. Patient education plan is to attend individual and/or group sessions per assessed needs and concerns.   Plan:   Patient Instructions  Goals:  Follow Diabetes Meal Plan as instructed  Eat 3 meals and 2 snacks, every 3-5 hrs  Aim for 1/2 plate non-starchy vegetables + 1/4 plate starch/grains + 1/4 plate protein with lunch and dinner.   Add lean protein foods to meals/snacks  Aim for 30 mins of physical activity daily      Expected Outcomes:  Demonstrated interest in learning. Expect positive outcomes  Education material provided: ADA - How to Thrive: A Guide for Your Journey with Diabetes  If problems or questions, patient to contact team via:  Phone and Email  Future DSME appointment: - PRN

## 2020-06-14 ENCOUNTER — Encounter: Payer: Self-pay | Admitting: Family Medicine

## 2020-06-14 ENCOUNTER — Other Ambulatory Visit: Payer: Self-pay

## 2020-06-14 ENCOUNTER — Ambulatory Visit (INDEPENDENT_AMBULATORY_CARE_PROVIDER_SITE_OTHER): Payer: Medicare Other | Admitting: Family Medicine

## 2020-06-14 DIAGNOSIS — M25471 Effusion, right ankle: Secondary | ICD-10-CM | POA: Diagnosis not present

## 2020-06-14 DIAGNOSIS — I2510 Atherosclerotic heart disease of native coronary artery without angina pectoris: Secondary | ICD-10-CM

## 2020-06-14 NOTE — Assessment & Plan Note (Signed)
Patient's ankle effusion seems to be nearly completely gone at this time.  Patient has improvement in range of motion.  He does have arthritic changes in the questionable OCD.  We discussed with patient about the potential for advanced imaging but patient would not want surgical intervention.  Patient is able to wear a regular shoe with no significant pain.  Patient will continue to stay active otherwise.  Follow-up with me again 2 to 3 months

## 2020-06-14 NOTE — Patient Instructions (Addendum)
Happy [place] Day Ankle looks better See me again 2-3 months

## 2020-06-14 NOTE — Progress Notes (Signed)
[name] Phone: [phone] Subjective:   [name], am serving as a scribe for Dr. Hulan Saas. This visit occurred during the SARS-CoV-2 public health emergency.  Safety protocols were in place, including screening questions prior to the visit, additional usage of staff PPE, and extensive cleaning of exam room while observing appropriate contact time as indicated for disinfecting solutions.   I'm seeing this patient by the request  of:  [name] Rail, MD  CC: Ankle pain follow-up  DPO:[id number]   05/17/2020 Completely resolved at this time. Responding well to the prednisone. We discussed the potential still for gout but I do think that nutrition is a good starting point for him. Discussed icing regimen. Patient will still add on left side avoiding the cam walker for another week and then increase slowly. Follow-up again 4 to 6 weeks.  Referred to nutrition  Update 06/14/2020 [name] is a 74 y.o. female coming in with complaint of right ankle effusion. Still notices pain with stairs and any step ups. Wants to know what she can do to prevent this from occurring in the future.  Patient states that overall she feels 90% better.  Nothing that is giving her significant discomfort on a regular     Patient previous x-rays taken May 10, 2020 showed a possible prior medial and lateral malleolus fractures with an OCD defect of the medial aspect of the talar dome  Past Medical History:  Diagnosis Date  . Allergy    seasonal  . Ankylosing spondylitis ([place])   . Anxiety   . Arthritis   . Bell palsy   . CAD (coronary artery disease)    a. Coronary CT 10/2016 showed calcium score in the 59th perecntile, moderate CAD in RCA/PDA and narrowing of the mid to distal LAD, with negative FFR.  . CTS (carpal tunnel syndrome)   . Cystitis   . Diabetes mellitus without complication ([place])   . Diverticulitis   .  Diverticulosis of colon   . Dry eye syndrome   . Exocrine pancreatic insufficiency   . Frozen shoulder   . GERD (gastroesophageal reflux disease)   . HLA B27 (HLA B27 positive)   . Hx of colonic polyps   . Hyperlipidemia    NMR 2005  . Hypertension   . Hypothyroidism   . Incontinence   . Internal hemorrhoids   . Interstitial cystitis   . Kidney stone   . Menopause   . Mild aortic insufficiency   . Mild ascending aorta dilatation (H[facility])   . Osteoarthritis   . Osteopenia    [facility] [place]   . S/P total hysterectomy and bilateral salpingo-oophorectomy 04/12/2014  . Sinusitis   . Sleep apnea    c-pap  . Subjective visual disturbance of both eyes 12/30/2013  . Tubular adenoma of colon   . Vitamin D deficiency   . Xerostomia    and Xeroophthalmia   Past Surgical History:  Procedure Laterality Date  . BLADDER SURGERY     for incontinence  . CARPAL TUNNEL RELEASE Left 12/22/2014   Procedure: LEFT CARPAL TUNNEL RELEASE;  Surgeon: Daryll Brod, MD;  Location: [facility];  Service: Orthopedics;  Laterality: Left;  . CARPAL TUNNEL RELEASE Right 02/18/2019   Procedure: RIGHT CARPAL TUNNEL RELEASE;  Surgeon: Daryll Brod, MD;  Location: [facility];  Service: Orthopedics;  Laterality: Right;  .  COLONOSCOPY W/ POLYPECTOMY     adenomatous poyp 04-2008  . colonscopy     tics 10-2002  . CYSTOSCOPY WITH RETROGRADE PYELOGRAM, URETEROSCOPY AND STENT PLACEMENT Left 09/01/2013   Procedure: CYSTOSCOPY WITH RETROGRADE PYELOGRAM, AND LEFT STENT PLACEMENT;  Surgeon: Molli Hazard, MD;  Location: [facility];  Service: Urology;  Laterality: Left;  . elevated LFTs     due to Elmiron  . FINGER SURGERY Left 2004   2nd finger  . G 1 P 1    . interstitial cystitis     with clot in catheter  . PARTIAL HYSTERECTOMY     with one ovary left  . SHOULDER ARTHROSCOPY Left   . TOTAL ABDOMINAL HYSTERECTOMY W/ BILATERAL SALPINGOOPHORECTOMY      dysfunctional menses   Social History   Socioeconomic History  . Marital status: Married    Spouse name: Not on file  . Number of children: 1  . Years of education: BS  . Highest education level: Not on file  Occupational History  . Occupation: retired  . Occupation: Pharmacist, hospital    Comment: Kindergarten  Tobacco Use  . Smoking status: Never Smoker  . Smokeless tobacco: Never Used  Vaping Use  . Vaping Use: Never used  Substance and Sexual Activity  . Alcohol use: Yes    Comment: social  . Drug use: [name]  . Sexual activity: Not Currently  Other Topics Concern  . Not on file  Social History Narrative   Lives   Caffeine use:    Married      Social Determinants of Health   Financial Resource Strain:   . Difficulty of Paying Living Expenses: Not on file  Food Insecurity:   . Worried About Charity fundraiser in the Last Year: Not on file  . Ran Out of Food in the Last Year: Not on file  Transportation Needs:   . Lack of Transportation (Medical): Not on file  . Lack of Transportation (Non-Medical): Not on file  Physical Activity:   . Days of Exercise per Week: Not on file  . Minutes of Exercise per Session: Not on file  Stress:   . Feeling of Stress : Not on file  Social Connections:   . Frequency of Communication with Friends and Family: Not on file  . Frequency of Social Gatherings with Friends and Family: Not on file  . Attends Religious Services: Not on file  . Active Member of Clubs or Organizations: Not on file  . Attends Archivist Meetings: Not on file  . Marital Status: Not on file   Allergies  Allergen Reactions  . Bactrim [Sulfamethoxazole-Trimethoprim] Other (See Comments)  . Nitroglycerin Anaphylaxis    Hypotensive after NTG administration in ER during chest pain evaluation Pt states she almost died,  Pt states in a procedure done in 10/2016 she had [name] reaction to this medication, so not sure if it is an actual allergy.   . Pentosan Polysulfate  Other (See Comments)    Elevated liver count Elevated liver enzymes Elevated LFTs.......Marland Kitchen elmiron   . Pentosan Polysulfate Sodium     Elevated LFTs.......Marland Kitchen elmiron   . Promethazine Hcl Other (See Comments)    "jittery on the inside"  . Sulfamethoxazole-Trimethoprim Nausea Only    Unknown reaction per pt, Pt thinks she may have felt sick to her stomach.   . Losartan Other (See Comments)    Pt states caused insomnia, tingling sensation in legs, lightheaded, stomach pain  . Promethazine Hcl   .  Lisinopril Cough   Family History  Problem Relation Age of Onset  . Coronary artery disease Mother   . Hypertension Mother   . Asthma Mother   . Dementia Mother   . Stroke Mother        mini cva  . Diabetes Father   . Cirrhosis Father        non alcoholic  . Colon cancer Sister 30  . Allergic rhinitis Sister   . Allergic rhinitis Sister   . Food Allergy Sister   . Renal cancer Paternal Aunt        x 2  . Coronary artery disease Maternal Grandfather   . Breast cancer Maternal Aunt   . Diabetes Paternal Aunt   . Diabetes Paternal Grandmother   . Esophageal cancer Neg Hx   . Stomach cancer Neg Hx      Current Outpatient Medications (Cardiovascular):  .  amLODipine (NORVASC) 2.5 MG tablet, Take 1 tablet (2.5 mg total) by mouth daily. Marland Kitchen  EPINEPHrine (EPIPEN 2-PAK) 0.3 mg/0.3 mL IJ SOAJ injection, Use as directed for severe allergic reactions .  rosuvastatin (CRESTOR) 5 MG tablet, Take 1 tablet by mouth 2X's a week  Current Outpatient Medications (Respiratory):  .  azelastine (ASTELIN) 0.1 % nasal spray, One to two sprays each nostril twice a day as needed. .  cetirizine (ZYRTEC) 10 MG tablet, Take 10 mg by mouth daily. Marland Kitchen  levocetirizine (XYZAL) 5 MG tablet, Take 1 tablet (5 mg total) by mouth daily as needed for allergies.  Current Outpatient Medications (Analgesics):  .  acetaminophen (TYLENOL) 500 MG tablet, Take 1,000 mg by mouth 3 (three) times daily as needed.  Marland Kitchen  aspirin 81  MG tablet, Take 81 mg by mouth daily.   Marland Kitchen  HYDROcodone-acetaminophen (NORCO/VICODIN) 5-325 MG tablet, hydrocodone 5 mg-acetaminophen 325 mg tablet  TAKE 1 TABLET BY MOUTH THREE TIMES DAILY AS NEEDED FOR 7 DAYS  Current Outpatient Medications (Hematological):  .  vitamin B-12 (CYANOCOBALAMIN) 1000 MCG tablet, Take 1,000 mcg by mouth 2 (two) times daily.   Current Outpatient Medications (Other):  Marland Kitchen  Ascorbic Acid (VITAMIN C) 1000 MG tablet, Take 1,000 mg by mouth 2 (two) times daily.  .  Cholecalciferol (VITAMIN D-3) 25 MCG (1000 UT) CAPS, Take 10,000 Units by mouth daily.  .  diclofenac Sodium (VOLTAREN) 1 % GEL, Apply topically 4 (four) times daily as needed.  .  dicyclomine (BENTYL) 10 MG capsule, Take 1 capsule (10 mg total) by mouth 3 (three) times daily as needed for spasms. .  diphenoxylate-atropine (LOMOTIL) 2.5-0.025 MG tablet, Take 1 tablet by mouth 4 (four) times daily as needed for diarrhea or loose stools. Marland Kitchen  estradiol (ESTRACE) 0.1 MG/GM vaginal cream, estradiol 0.01% (0.1 mg/gram) vaginal cream .  lipase/protease/amylase (CREON) 36000 UNITS CPEP capsule, Take 2 capsules by mouth with each meal, 1 capsule by mouth with each snack .  Magnesium 200 MG TABS, Take 1 tablet (200 mg total) by mouth daily. .  Multiple Vitamins-Minerals (CENTRUM SILVER 50+WOMEN PO), Take 1 tablet by mouth daily. Marland Kitchen  MYRBETRIQ 25 MG TB24 tablet, Take 25 mg by mouth daily. .  naloxone (NARCAN) 4 MG/0.1ML LIQD nasal spray kit, Narcan 4 mg/actuation nasal spray  PLACE 1 SPRAY INTO EACH NOSTRIL FOR SUSPECTED OPIOID OVERDOSE. MAY REPEAT 2 - 3 MINUTES WITH NEW BOTTLE IN OTHER NOSTRIL .  olopatadine (PATANOL) 0.1 % ophthalmic solution, Place 1 drop into both eyes 2 (two) times daily. .  Omega-3 Fatty Acids ([place])  1000 MG CAPS, Takes 1000 mg BID .  omeprazole (PRILOSEC) 40 MG capsule, TAKE 1 CAPSULE BY MOUTH ONCE DAILY .  ondansetron (ZOFRAN) 4 MG tablet, Take 1 tablet (4 mg total) by mouth every 8  (eight) hours as needed. .  sertraline (ZOLOFT) 25 MG tablet, Take 1 tablet (25 mg total) by mouth daily. .  vitamin E 400 UNIT capsule, Take 400 Units by mouth daily. Marland Kitchen  zinc gluconate 50 MG tablet, Take 50 mg by mouth daily. .  blood glucose meter kit and supplies KIT, Dispense based on patient and insurance preference. Use up to four times daily as directed. prediabetes   Reviewed prior external information including notes and imaging from  primary care provider As well as notes that were available from care everywhere and other healthcare systems.  Past medical history, social, surgical and family history all reviewed in electronic medical record.  [name] pertanent information unless stated regarding to the chief complaint.   Review of Systems:  [name] headache, visual changes, nausea, vomiting, diarrhea, constipation, dizziness, abdominal pain, skin rash, fevers, chills, night sweats, weight loss, swollen lymph nodes, body aches, joint swelling, chest pain, shortness of breath, mood changes. POSITIVE muscle aches  Objective  Blood pressure 122/84, pulse 68, height 5' 2"  (1.575 m), weight 152 lb (68.9 kg), SpO2 98 %.   General: [name] apparent distress alert and oriented x3 mood and affect normal, dressed appropriately.  HEENT: Pupils equal, extraocular movements intact  Respiratory: Patient's speak in full sentences and does not appear short of breath  Cardiovascular: [name] lower extremity edema, non tender, [name] erythema  Trace effusion of the lower extremity noted. Right ankle has some mild arthritic changes.  Significant improvement in range of motion.  Some pain with resisted external rotation.  Patient does have some mild laxity of the ATFL.  Nontender over the talar dome which is an improvement.  Achilles is unremarkable     Impression and Recommendations:     The above documentation has been reviewed and is accurate and complete Lyndal Pulley, DO

## 2020-06-21 MED FILL — AMLODIPINE 2.5 MG TABLET: 2.5 | 90 days supply | Qty: 90 | Fill #1

## 2020-06-22 ENCOUNTER — Other Ambulatory Visit (HOSPITAL_BASED_OUTPATIENT_CLINIC_OR_DEPARTMENT_OTHER): Payer: Self-pay | Admitting: Internal Medicine

## 2020-06-22 DIAGNOSIS — M159 Polyosteoarthritis, unspecified: Secondary | ICD-10-CM | POA: Diagnosis not present

## 2020-06-22 DIAGNOSIS — R768 Other specified abnormal immunological findings in serum: Secondary | ICD-10-CM | POA: Diagnosis not present

## 2020-06-22 MED FILL — predniSONE 5 MG TABS: 5 | 30 days supply | Qty: 30 | Fill #0

## 2020-06-30 ENCOUNTER — Other Ambulatory Visit: Payer: Self-pay

## 2020-06-30 ENCOUNTER — Ambulatory Visit (HOSPITAL_COMMUNITY): Payer: Medicare Other | Attending: Cardiovascular Disease

## 2020-06-30 DIAGNOSIS — I10 Essential (primary) hypertension: Secondary | ICD-10-CM

## 2020-06-30 DIAGNOSIS — R600 Localized edema: Secondary | ICD-10-CM

## 2020-06-30 DIAGNOSIS — G2581 Restless legs syndrome: Secondary | ICD-10-CM | POA: Diagnosis not present

## 2020-06-30 DIAGNOSIS — E785 Hyperlipidemia, unspecified: Secondary | ICD-10-CM | POA: Diagnosis not present

## 2020-06-30 DIAGNOSIS — I2510 Atherosclerotic heart disease of native coronary artery without angina pectoris: Secondary | ICD-10-CM | POA: Diagnosis not present

## 2020-06-30 DIAGNOSIS — I77810 Thoracic aortic ectasia: Secondary | ICD-10-CM | POA: Diagnosis not present

## 2020-06-30 DIAGNOSIS — I351 Nonrheumatic aortic (valve) insufficiency: Secondary | ICD-10-CM | POA: Diagnosis not present

## 2020-06-30 LAB — ECHOCARDIOGRAM COMPLETE
Area-P 1/2: 3.27 cm2
P 1/2 time: 586 msec
S' Lateral: 2.5 cm

## 2020-06-30 MED ORDER — OLOPATADINE HCL 0.1 % OP SOLN: 1.0000 [drp] | Freq: Two times a day (BID) | OPHTHALMIC | 2 refills | Status: DC

## 2020-07-06 DIAGNOSIS — M25559 Pain in unspecified hip: Secondary | ICD-10-CM | POA: Diagnosis not present

## 2020-07-06 DIAGNOSIS — M810 Age-related osteoporosis without current pathological fracture: Secondary | ICD-10-CM | POA: Diagnosis not present

## 2020-07-06 DIAGNOSIS — M79673 Pain in unspecified foot: Secondary | ICD-10-CM | POA: Diagnosis not present

## 2020-07-06 DIAGNOSIS — G894 Chronic pain syndrome: Secondary | ICD-10-CM | POA: Diagnosis not present

## 2020-07-06 DIAGNOSIS — M159 Polyosteoarthritis, unspecified: Secondary | ICD-10-CM | POA: Diagnosis not present

## 2020-07-06 DIAGNOSIS — M25519 Pain in unspecified shoulder: Secondary | ICD-10-CM | POA: Diagnosis not present

## 2020-07-06 DIAGNOSIS — M48061 Spinal stenosis, lumbar region without neurogenic claudication: Secondary | ICD-10-CM | POA: Diagnosis not present

## 2020-07-06 DIAGNOSIS — M79643 Pain in unspecified hand: Secondary | ICD-10-CM | POA: Diagnosis not present

## 2020-07-06 DIAGNOSIS — M461 Sacroiliitis, not elsewhere classified: Secondary | ICD-10-CM | POA: Diagnosis not present

## 2020-07-08 ENCOUNTER — Other Ambulatory Visit (HOSPITAL_BASED_OUTPATIENT_CLINIC_OR_DEPARTMENT_OTHER): Payer: Self-pay | Admitting: Dermatology

## 2020-07-08 DIAGNOSIS — D220 Melanocytic nevi of lip: Secondary | ICD-10-CM | POA: Diagnosis not present

## 2020-07-08 DIAGNOSIS — L821 Other seborrheic keratosis: Secondary | ICD-10-CM | POA: Diagnosis not present

## 2020-07-08 DIAGNOSIS — I8311 Varicose veins of right lower extremity with inflammation: Secondary | ICD-10-CM | POA: Diagnosis not present

## 2020-07-08 DIAGNOSIS — D1801 Hemangioma of skin and subcutaneous tissue: Secondary | ICD-10-CM | POA: Diagnosis not present

## 2020-07-08 DIAGNOSIS — I872 Venous insufficiency (chronic) (peripheral): Secondary | ICD-10-CM | POA: Diagnosis not present

## 2020-07-08 DIAGNOSIS — L720 Epidermal cyst: Secondary | ICD-10-CM | POA: Diagnosis not present

## 2020-07-08 DIAGNOSIS — L649 Androgenic alopecia, unspecified: Secondary | ICD-10-CM | POA: Diagnosis not present

## 2020-07-08 DIAGNOSIS — D2262 Melanocytic nevi of left upper limb, including shoulder: Secondary | ICD-10-CM | POA: Diagnosis not present

## 2020-07-08 DIAGNOSIS — I8312 Varicose veins of left lower extremity with inflammation: Secondary | ICD-10-CM | POA: Diagnosis not present

## 2020-07-08 DIAGNOSIS — L723 Sebaceous cyst: Secondary | ICD-10-CM | POA: Diagnosis not present

## 2020-07-08 DIAGNOSIS — L738 Other specified follicular disorders: Secondary | ICD-10-CM | POA: Diagnosis not present

## 2020-07-08 MED FILL — TRIAMCINOLONE ACETONIDE 0.1: 0.1 | 30 days supply | Qty: 454 | Fill #0

## 2020-07-09 ENCOUNTER — Other Ambulatory Visit: Payer: Self-pay

## 2020-07-09 MED ORDER — AMLODIPINE BESYLATE 2.5 MG PO TABS
2.5000 mg | ORAL_TABLET | Freq: Every day | ORAL | 2 refills | Status: DC
Start: 2020-07-09 — End: 2021-09-20

## 2020-07-09 NOTE — Telephone Encounter (Signed)
Yes, please refill. Thanks!

## 2020-07-09 NOTE — Telephone Encounter (Signed)
[facility] mail order pharmacy is requesting a refill on Amlodipine. Dr. Meda Coffee D/C this medication and pt's PCP restarted pt on this medication. Would Dr. Meda Coffee like to refill this medication? Please address

## 2020-07-20 ENCOUNTER — Telehealth: Payer: Self-pay | Admitting: Neurology

## 2020-07-20 ENCOUNTER — Ambulatory Visit (INDEPENDENT_AMBULATORY_CARE_PROVIDER_SITE_OTHER): Payer: Medicare Other | Admitting: Neurology

## 2020-07-20 ENCOUNTER — Other Ambulatory Visit: Payer: Self-pay | Admitting: Neurology

## 2020-07-20 ENCOUNTER — Encounter: Payer: Self-pay | Admitting: Neurology

## 2020-07-20 VITALS — BP 148/88 | HR 65 | Ht 62.0 in | Wt 153.0 lb

## 2020-07-20 DIAGNOSIS — R413 Other amnesia: Secondary | ICD-10-CM | POA: Diagnosis not present

## 2020-07-20 DIAGNOSIS — I2510 Atherosclerotic heart disease of native coronary artery without angina pectoris: Secondary | ICD-10-CM

## 2020-07-20 DIAGNOSIS — E538 Deficiency of other specified B group vitamins: Secondary | ICD-10-CM

## 2020-07-20 MED ORDER — ALPRAZOLAM 0.5 MG PO TABS: ORAL_TABLET | ORAL | 0 refills | Status: DC

## 2020-07-20 MED FILL — ALPRAZolam 0.5 MG TABS: 0.5 | 1 days supply | Qty: 3 | Fill #0

## 2020-07-20 NOTE — Progress Notes (Signed)
Reason for visit: Head pain, memory disturbance  Referring physician: Dr. Annye Rusk is a 74 y.o. female  History of present illness:  [name] is a 74 year old right-handed white female with a history of some sharp pains that have occurred in [name] left frontoparietal area of [name] head that she has noticed over [name] last 6 months or more.  [name] episodes are not frequent, they may occur once or twice a week and are very brief lasting 1 to 2 minutes but are quite sharp.  Between [name] episodes, she feels normal.  [name] headache pain is always in [name] same spot, it never migrates.  [name] has noted some tooth ache sensation and has seen [name] dentist regarding this, she has some occasional left-sided neck discomfort and she has discomfort in [name] left shoulder associated with severe arthritis.  She was told that she will need surgery on [name] left shoulder.  [name] also reports that she has had some changes in memory.  She has difficulty with focusing and retaining new information, she believes that she has been under a lot of stress taking care of her husband.  [name] has sleep apnea, she is on CPAP.  She denies any focal numbness or weakness of [name] face, arms, or legs.  She denies any balance changes, she does have chronic issues with urinary incontinence but this is not a new problem.  She has in [name] past had visual changes that were felt to be migrainous in nature, but she has never had overt migraine headache.  She comes to this office for further evaluation.  Past Medical History:  Diagnosis Date  . Allergy    seasonal  . Ankylosing spondylitis ([place])   . Anxiety   . Arthritis   . Bell palsy   . CAD (coronary artery disease)    a. Coronary CT 10/2016 showed calcium score in [name] 59th perecntile, moderate CAD in RCA/PDA and narrowing of [name] mid to distal LAD, with negative FFR.  . CTS (carpal tunnel syndrome)   . Cystitis   . Diabetes mellitus without complication ([place])   .  Diverticulitis   . Diverticulosis of colon   . Dry eye syndrome   . Exocrine pancreatic insufficiency   . Frozen shoulder   . GERD (gastroesophageal reflux disease)   . HLA B27 (HLA B27 positive)   . Hx of colonic polyps   . Hyperlipidemia    NMR 2005  . Hypertension   . Hypothyroidism   . Incontinence   . Internal hemorrhoids   . Interstitial cystitis   . Kidney stone   . Menopause   . Mild aortic insufficiency   . Mild ascending aorta dilatation ([facility])   . Osteoarthritis   . Osteopenia    [facility] [place]   . S/P total hysterectomy and bilateral salpingo-oophorectomy 04/12/2014  . Sinusitis   . Sleep apnea    c-pap  . Subjective visual disturbance of both eyes 12/30/2013  . Tubular adenoma of colon   . Vitamin D deficiency   . Xerostomia    and Xeroophthalmia    Past Surgical History:  Procedure Laterality Date  . BLADDER SURGERY     for incontinence  . CARPAL TUNNEL RELEASE Left 12/22/2014   Procedure: LEFT CARPAL TUNNEL RELEASE;  Surgeon: Daryll Brod, MD;  Location: [facility];  Service: Orthopedics;  Laterality: Left;  . CARPAL TUNNEL RELEASE Right 02/18/2019  Procedure: RIGHT CARPAL TUNNEL RELEASE;  Surgeon: Daryll Brod, MD;  Location: [facility];  Service: Orthopedics;  Laterality: Right;  . COLONOSCOPY W/ POLYPECTOMY     adenomatous poyp 04-2008  . colonscopy     tics 10-2002  . CYSTOSCOPY WITH RETROGRADE PYELOGRAM, URETEROSCOPY AND STENT PLACEMENT Left 09/01/2013   Procedure: CYSTOSCOPY WITH RETROGRADE PYELOGRAM, AND LEFT STENT PLACEMENT;  Surgeon: Molli Hazard, MD;  [facility];  Service: Urology;  Laterality: Left;  . elevated LFTs     due to Elmiron  . FINGER SURGERY Left 2004   2nd finger  . G 1 P 1    . interstitial cystitis     with clot in catheter  . PARTIAL HYSTERECTOMY     with one ovary left  . SHOULDER ARTHROSCOPY Left   . TOTAL ABDOMINAL HYSTERECTOMY W/ BILATERAL  SALPINGOOPHORECTOMY     dysfunctional menses    Family History  Problem Relation Age of Onset  . Coronary artery disease Mother   . Hypertension Mother   . Asthma Mother   . Dementia Mother   . Stroke Mother        mini cva  . Diabetes Father   . Cirrhosis Father        non alcoholic  . Colon cancer Sister 28  . Allergic rhinitis Sister   . Allergic rhinitis Sister   . Food Allergy Sister   . Renal cancer Paternal Aunt        x 2  . Coronary artery disease Maternal Grandfather   . Breast cancer Maternal Aunt   . Diabetes Paternal Aunt   . Diabetes Paternal Grandmother   . Esophageal cancer Neg Hx   . Stomach cancer Neg Hx     Social history:  reports that she has never smoked. She has never used smokeless tobacco. She reports current alcohol use. She reports that she does not use drugs.  Medications:  Prior to Admission medications   Medication Sig Start Date End Date Taking? Authorizing Provider  acetaminophen (TYLENOL) 500 MG tablet Take 1,000 mg by mouth 3 (three) times daily as needed.    Yes [provider]  amLODipine (NORVASC) 2.5 MG tablet Take 1 tablet (2.5 mg total) by mouth daily. 07/09/20  Yes Dorothy Spark, MD  Ascorbic Acid (VITAMIN C) 1000 MG tablet Take 1,000 mg by mouth 2 (two) times daily.    Yes [provider]  aspirin 81 MG tablet Take 81 mg by mouth daily.   Yes [provider]  azelastine (ASTELIN) 0.1 % nasal spray One to two sprays each nostril twice a day as needed. 09/18/18  Yes Bobbitt, Sedalia Muta, MD  cetirizine (ZYRTEC) 10 MG tablet Take 10 mg by mouth daily.   Yes [provider]  Cholecalciferol (VITAMIN D-3) 25 MCG (1000 UT) CAPS Take 10,000 Units by mouth daily.    Yes [provider]  diclofenac Sodium (VOLTAREN) 1 % GEL Apply topically 4 (four) times daily as needed.    Yes [provider]  dicyclomine (BENTYL) 10 MG capsule Take 1 capsule (10 mg total) by mouth 3 (three) times  daily as needed for spasms. 09/17/19  Yes Pyrtle, Lajuan Lines, MD  diphenoxylate-atropine (LOMOTIL) 2.5-0.025 MG tablet Take 1 tablet by mouth 4 (four) times daily as needed for diarrhea or loose stools. 09/17/19  Yes Pyrtle, Lajuan Lines, MD  EPINEPHrine (EPIPEN 2-PAK) 0.3 mg/0.3 mL IJ SOAJ injection Use as directed for severe allergic reactions 09/24/19  Yes Bobbitt, Sedalia Muta, MD  estradiol (ESTRACE) 0.1 MG/GM vaginal cream estradiol 0.01% (0.1 mg/gram) vaginal cream 10/15/19  Yes [provider]  HYDROcodone-acetaminophen (NORCO/VICODIN) 5-325 MG tablet hydrocodone 5 mg-acetaminophen 325 mg tablet  TAKE 1 TABLET BY MOUTH THREE TIMES DAILY AS NEEDED FOR 7 DAYS   Yes [provider]  lipase/protease/amylase (CREON) 36000 UNITS CPEP capsule Take 2 capsules by mouth with each meal, 1 capsule by mouth with each snack 11/20/19  Yes Pyrtle, Lajuan Lines, MD  Magnesium 200 MG TABS Take 1 tablet (200 mg total) by mouth daily. 11/25/18  Yes Dorothy Spark, MD  Multiple Vitamins-Minerals (CENTRUM SILVER 50+WOMEN PO) Take 1 tablet by mouth daily.   Yes [provider]  MYRBETRIQ 25 MG TB24 tablet Take 25 mg by mouth daily. 05/28/20  Yes [provider]  naloxone (NARCAN) 4 MG/0.1ML LIQD nasal spray kit Narcan 4 mg/actuation nasal spray  PLACE 1 SPRAY INTO EACH NOSTRIL FOR SUSPECTED OPIOID OVERDOSE. MAY REPEAT 2 - 3 MINUTES WITH NEW BOTTLE IN OTHER NOSTRIL   Yes [provider]  Omega-3 Fatty Acids (FISH OIL BURP-LESS) 1000 MG CAPS Takes 1000 mg BID 01/03/16  Yes Esterwood, Amy S, PA-C  omeprazole (PRILOSEC) 40 MG capsule TAKE 1 CAPSULE BY MOUTH ONCE DAILY 05/10/20  Yes Pyrtle, Lajuan Lines, MD  ondansetron (ZOFRAN) 4 MG tablet Take 1 tablet (4 mg total) by mouth every 8 (eight) hours as needed. 09/17/19  Yes Pyrtle, Lajuan Lines, MD  rosuvastatin (CRESTOR) 5 MG tablet Take 1 tablet by mouth 2X's a week 02/05/20  Yes Dunn, Dayna N, PA-C  sertraline (ZOLOFT) 25 MG tablet Take 1 tablet (25 mg total) by  mouth daily. 04/20/20  Yes Burns, Claudina Lick, MD  vitamin B-12 (CYANOCOBALAMIN) 1000 MCG tablet Take 1,000 mcg by mouth 2 (two) times daily.    Yes [provider]  vitamin E 400 UNIT capsule Take 400 Units by mouth daily.   Yes [provider]  zinc gluconate 50 MG tablet Take 50 mg by mouth daily.   Yes [provider]      Allergies  Allergen Reactions  . Bactrim [Sulfamethoxazole-Trimethoprim] Other (See Comments)  . Nitroglycerin Anaphylaxis    Hypotensive after NTG administration in ER during chest pain evaluation Pt states she almost died,  Pt states in a procedure done in 10/2016 she had no reaction to this medication, so not sure if it is an actual allergy.   . Pentosan Polysulfate Other (See Comments)    Elevated liver count Elevated liver enzymes Elevated LFTs.......Marland Kitchen elmiron   . Pentosan Polysulfate Sodium     Elevated LFTs.......Marland Kitchen elmiron   . Promethazine Hcl Other (See Comments)    "jittery on [name] inside"  . Sulfamethoxazole-Trimethoprim Nausea Only    Unknown reaction per pt, Pt thinks she may have felt sick to her stomach.   . Losartan Other (See Comments)    Pt states caused insomnia, tingling sensation in legs, lightheaded, stomach pain  . Promethazine Hcl   . Lisinopril Cough    ROS:  Out of a complete 14 system review of symptoms, [name] complains only of [name] following symptoms, and all other reviewed systems are negative.  Head pain Jaw pain Left shoulder pain Memory problems  Blood pressure (!) 148/88, pulse 65, height 5' 2"  (1.575 m), weight 153 lb (69.4 kg).  Physical Exam  General: [name] is alert and cooperative at [name] time of [name] examination.  Eyes: Pupils are equal, round, and  reactive to light. Discs are flat bilaterally.  Neck: [name] neck is supple, no carotid bruits are noted.  Respiratory: [name] respiratory examination is clear.  Cardiovascular: [name] cardiovascular examination reveals a regular rate and  rhythm, no obvious murmurs or rubs are noted.  Skin: Extremities are without significant edema.  Neurologic Exam  Mental status: [name] is alert and oriented x 3 at [name] time of [name] examination. [name] has apparent normal recent and remote memory, with an apparently normal attention span and concentration ability.  Cranial nerves: Facial symmetry is present. There is good sensation of [name] face to pinprick and soft touch bilaterally. [name] strength of [name] facial muscles and [name] muscles to head turning and shoulder shrug are normal bilaterally. Speech is well enunciated, no aphasia or dysarthria is noted. Extraocular movements are full. Visual fields are full. [name] tongue is midline, and [name] has symmetric elevation of [name] soft palate. No obvious hearing deficits are noted.  Motor: [name] motor testing reveals 5 over 5 strength of all 4 extremities. Good symmetric motor tone is noted throughout.  Sensory: Sensory testing is intact to pinprick, soft touch, vibration sensation, and position sense on all 4 extremities. No evidence of extinction is noted.  Coordination: Cerebellar testing reveals good finger-nose-finger and heel-to-shin bilaterally.  Gait and station: Gait is normal. Tandem gait is normal. Romberg is negative. No drift is seen.  Reflexes: Deep tendon reflexes are symmetric and normal bilaterally. Toes are downgoing bilaterally.   Assessment/Plan:  1.  Left-sided head pain, possible "ice pick pain"  2.  Reported mild memory changes  3.  Sleep apnea on CPAP  [name] has sharp pain in [name] head that are consistent with ice pick pains.  [name] has had visual phenomena in [name] past that were felt to be migrainous in nature.  [name] however is also noted some changes in memory.  We will check blood work today and check MRI of [name] brain.  [name] is under a lot of stress and this could be impacting her cognitive functioning.  [name] has not altered any  of her activities of daily living because of memory.  Jill Alexanders MD 07/20/2020 9:42 AM  [facility] [address] [place], [place] [address]  Phone [phone] Fax [phone]

## 2020-07-20 NOTE — Telephone Encounter (Signed)
Medicare/tricare order sent to [facility]. No auth they will reach out to the patient to schedule.  

## 2020-07-21 LAB — RPR: RPR Ser Ql: NONREACTIVE

## 2020-07-21 LAB — COMPREHENSIVE METABOLIC PANEL
ALT: 25 IU/L (ref 0–32)
AST: 24 IU/L (ref 0–40)
Albumin/Globulin Ratio: 1.9 (ref 1.2–2.2)
Albumin: 4.5 g/dL (ref 3.7–4.7)
Alkaline Phosphatase: 129 IU/L — ABNORMAL HIGH (ref 44–121)
BUN/Creatinine Ratio: 24 (ref 12–28)
BUN: 20 mg/dL (ref 8–27)
Bilirubin Total: 0.2 mg/dL (ref 0.0–1.2)
CO2: 22 mmol/L (ref 20–29)
Calcium: 9.5 mg/dL (ref 8.7–10.3)
Chloride: 103 mmol/L (ref 96–106)
Creatinine, Ser: 0.84 mg/dL (ref 0.57–1.00)
GFR calc Af Amer: 78 mL/min/{1.73_m2} (ref >59)
GFR calc non Af Amer: 68 mL/min/{1.73_m2} (ref >59)
Globulin, Total: 2.4 g/dL (ref 1.5–4.5)
Glucose: 90 mg/dL (ref 65–99)
Potassium: 4.2 mmol/L (ref 3.5–5.2)
Sodium: 139 mmol/L (ref 134–144)
Total Protein: 6.9 g/dL (ref 6.0–8.5)

## 2020-07-21 LAB — SEDIMENTATION RATE: Sed Rate: 2 mm/hr (ref 0–40)

## 2020-07-21 LAB — VITAMIN B12: Vitamin B-12: 1768 pg/mL — ABNORMAL HIGH (ref 232–1245)

## 2020-07-31 HISTORY — PX: OTHER SURGICAL HISTORY: SHX169

## 2020-08-02 ENCOUNTER — Telehealth: Payer: Self-pay | Admitting: *Deleted

## 2020-08-02 ENCOUNTER — Other Ambulatory Visit: Payer: Self-pay

## 2020-08-02 ENCOUNTER — Encounter: Payer: Self-pay | Admitting: *Deleted

## 2020-08-02 ENCOUNTER — Ambulatory Visit (INDEPENDENT_AMBULATORY_CARE_PROVIDER_SITE_OTHER): Payer: Medicare Other | Admitting: Cardiology

## 2020-08-02 ENCOUNTER — Other Ambulatory Visit: Payer: Self-pay | Admitting: *Deleted

## 2020-08-02 ENCOUNTER — Encounter: Payer: Self-pay | Admitting: Cardiology

## 2020-08-02 ENCOUNTER — Ambulatory Visit (INDEPENDENT_AMBULATORY_CARE_PROVIDER_SITE_OTHER): Payer: Medicare Other

## 2020-08-02 VITALS — BP 134/76 | HR 68 | Ht 62.0 in | Wt 153.0 lb

## 2020-08-02 DIAGNOSIS — E782 Mixed hyperlipidemia: Secondary | ICD-10-CM | POA: Diagnosis not present

## 2020-08-02 DIAGNOSIS — R002 Palpitations: Secondary | ICD-10-CM

## 2020-08-02 DIAGNOSIS — R072 Precordial pain: Secondary | ICD-10-CM

## 2020-08-02 DIAGNOSIS — I2510 Atherosclerotic heart disease of native coronary artery without angina pectoris: Secondary | ICD-10-CM

## 2020-08-02 DIAGNOSIS — I10 Essential (primary) hypertension: Secondary | ICD-10-CM | POA: Diagnosis not present

## 2020-08-02 DIAGNOSIS — R931 Abnormal findings on diagnostic imaging of heart and coronary circulation: Secondary | ICD-10-CM

## 2020-08-02 LAB — BASIC METABOLIC PANEL
BUN/Creatinine Ratio: 16 (ref 12–28)
BUN: 16 mg/dL (ref 8–27)
CO2: 22 mmol/L (ref 20–29)
Calcium: 9.7 mg/dL (ref 8.7–10.3)
Chloride: 105 mmol/L (ref 96–106)
Creatinine, Ser: 0.97 mg/dL (ref 0.57–1.00)
GFR calc Af Amer: 66 mL/min/{1.73_m2} (ref >59)
GFR calc non Af Amer: 57 mL/min/{1.73_m2} — ABNORMAL LOW (ref >59)
Glucose: 101 mg/dL — ABNORMAL HIGH (ref 65–99)
Potassium: 4.4 mmol/L (ref 3.5–5.2)
Sodium: 141 mmol/L (ref 134–144)

## 2020-08-02 LAB — LIPID PANEL
Chol/HDL Ratio: 2.7 ratio (ref 0.0–4.4)
Cholesterol, Total: 169 mg/dL (ref 100–199)
HDL: 62 mg/dL (ref >39)
LDL Chol Calc (NIH): 77 mg/dL (ref 0–99)
Triglycerides: 182 mg/dL — ABNORMAL HIGH (ref 0–149)
VLDL Cholesterol Cal: 30 mg/dL (ref 5–40)

## 2020-08-02 MED ORDER — METOPROLOL TARTRATE 50 MG PO TABS: 50.0000 mg | ORAL_TABLET | Freq: Once | ORAL | 0 refills | Status: DC

## 2020-08-02 MED FILL — METOPROLOL TARTRATE 50 MG T: 50 | 1 days supply | Qty: 1 | Fill #0

## 2020-08-02 NOTE — Telephone Encounter (Signed)
-----   Message from Ernst Bowler sent at 08/02/2020  9:57 AM EST ----- Regarding: RE: 7 DAY ZIO FOR PALPITATIONS PER NELSON Patient enrolled.  Thanks,  Burnett Harry ----- Message ----- From: Loa Socks, LPN Sent: 4/0/7680   9:12 AM EST To: Ernst Bowler, Katrina Carmell [place] Subject: 7 DAY ZIO FOR PALPITATIONS PER NELSON          Dr. Delton See wants this pt to have a zio and wear for 7 days for palpitations. Can you please enroll and let me know?  Thanks, [employer]

## 2020-08-02 NOTE — Progress Notes (Signed)
Patient ID: [name], female   DOB: Oct 31, 1945, 75 y.o.   MRN: [id number] Patient enrolled for [employer] to ship a 7 day ZIO XT long term holter monitor to her home.

## 2020-08-02 NOTE — Progress Notes (Signed)
Cardiology Office Note    Date:  08/02/2020   ID:  Storie, Heffern 1945-10-07, MRN [id number]  PCP:  Binnie Rail, MD  Cardiologist:  Ena Dawley, MD  Electrophysiologist:  None   Chief Complaint: f/u CAD  History of Present Illness:   [name] is a 75 y.o. female with history of moderate CAD by 2018 coronary CTA, aortic atherosclerosis, HTN, HLD, ankylosing spondylitis/HLA B27 positive, anxiety, arthritis, Bell palsy, diverticulosis, pancreatic insufficiency, HLD, HTN, interstitial cystitis, OA, pre-DM, OSA on CPAP (followed at [facility]) who presents for routine cardiology evaluation. [name] is also followed by our clinic.  She has been established with Dr. Meda Coffee for history of chest pain in the context of HLA B27 positivity. Nuclear stress test 12/2012 was normal, labeit with limited exercise capacity. Coronary CT 10/2016 showed calcium score in the 59th perecntile, moderate CAD in RCA/PDA and narrowing of the mid to distal LAD, with negative FFR. 04/2017 CT showed no abnormalities of the aorta. Last echo in 06/2018 showed EF 55-60%, grade 1 DD, mild AI, mildly dilated LA, mildly dilated ascending aorta. She has been followed by our pharmD lipid clinic with history of myalgias, unclear if totally related to statin. She describes this as a creepy crawling feeling or the need to move her legs. She does not recall if the symptoms started coinciding with the statin initiation. It did improve slightly with the statin holiday. She is now back on Crestor 3x/week and has felt this feeling twice. She does recall a prior history of IDA. She likes to work in the garden but has been limited by shoulder problems for which she is going to be having left shoulder surgery at some point (does not hurt during activity but later in the day afterwards). She denies any angina or DOE with activities like walking or gardening. Every other day or less she gets a fleeting pinpoint feeling of chest  discomfort lasting a few seconds, relieved with direct pressure with her fingers to this area. It is not related to exertion and not associated with other concerning symptoms. She also notes puffiness around her eyes in the morning and occasional sockline ankle edema. She also has noticed a faint redness (appears like a papular rash) under the skin to her lower legs below the knee for quite a while. No systemic rashes otherwise.  August 02, 2020 -the patient is coming for follow-up, she complains of intermittent left-sided chest pain that sometimes goes away with rubbing sometimes does not, it is not related to exertion.  She has also noticed new palpitations that will last about a minute or 2 happen several times a week and make her feel dizzy but so far no presyncope or syncope.  These are new as of last 6 months.  She has been compliant with her medications and has no side effects, no lower extremity edema.  She is now taking amlodipine.  Her blood pressure has been in the 120s.  She occasionally experiences orthostatic hypotension when she first gets up.  No falls.  Labwork independently reviewed: 10/2019 CBC wnl, K 4.4, Cr 1.01 01/2019 LDL 47 11/2018 TSH wnl  Past Medical History:  Diagnosis Date  . Allergy    seasonal  . Ankylosing spondylitis ([place])   . Anxiety   . Arthritis   . Bell palsy   . CAD (coronary artery disease)    a. Coronary CT 10/2016 showed calcium score in the 59th perecntile, moderate CAD in RCA/PDA  and narrowing of the mid to distal LAD, with negative FFR.  . CTS (carpal tunnel syndrome)   . Cystitis   . Diabetes mellitus without complication ([place])   . Diverticulitis   . Diverticulosis of colon   . Dry eye syndrome   . Exocrine pancreatic insufficiency   . Frozen shoulder   . GERD (gastroesophageal reflux disease)   . HLA B27 (HLA B27 positive)   . Hx of colonic polyps   . Hyperlipidemia    NMR 2005  . Hypertension   . Hypothyroidism   . Incontinence   .  Internal hemorrhoids   . Interstitial cystitis   . Kidney stone   . Menopause   . Mild aortic insufficiency   . Mild ascending aorta dilatation ([facility])   . Osteoarthritis   . Osteopenia    [facility] [place]   . S/P total hysterectomy and bilateral salpingo-oophorectomy 04/12/2014  . Sinusitis   . Sleep apnea    c-pap  . Subjective visual disturbance of both eyes 12/30/2013  . Tubular adenoma of colon   . Vitamin D deficiency   . Xerostomia    and Xeroophthalmia    Past Surgical History:  Procedure Laterality Date  . BLADDER SURGERY     for incontinence  . CARPAL TUNNEL RELEASE Left 12/22/2014   Procedure: LEFT CARPAL TUNNEL RELEASE;  Surgeon: Daryll Brod, MD;  Location: [facility];  Service: Orthopedics;  Laterality: Left;  . CARPAL TUNNEL RELEASE Right 02/18/2019   Procedure: RIGHT CARPAL TUNNEL RELEASE;  Surgeon: Daryll Brod, MD;  Location: [place];  Service: Orthopedics;  Laterality: Right;  . COLONOSCOPY W/ POLYPECTOMY     adenomatous poyp 04-2008  . colonscopy     tics 10-2002  . CYSTOSCOPY WITH RETROGRADE PYELOGRAM, URETEROSCOPY AND STENT PLACEMENT Left 09/01/2013   Procedure: CYSTOSCOPY WITH RETROGRADE PYELOGRAM, AND LEFT STENT PLACEMENT;  Surgeon: Molli Hazard, MD;  Location: [facility];  Service: Urology;  Laterality: Left;  . elevated LFTs     due to [place]  . FINGER SURGERY Left 2004   2nd finger  . G 1 P 1    . interstitial cystitis     with clot in catheter  . PARTIAL HYSTERECTOMY     with one ovary left  . SHOULDER ARTHROSCOPY Left   . TOTAL ABDOMINAL HYSTERECTOMY W/ BILATERAL SALPINGOOPHORECTOMY     dysfunctional menses    Current Medications: Current Meds  Medication Sig  . acetaminophen (TYLENOL) 500 MG tablet Take 1,000 mg by mouth 3 (three) times daily as needed.   . ALPRAZolam (XANAX) 0.5 MG tablet Take 2 tablets approximately 45 minutes prior to the MRI study, take a third tablet if  needed.  Marland Kitchen amLODipine (NORVASC) 2.5 MG tablet Take 1 tablet (2.5 mg total) by mouth daily.  . Ascorbic Acid (VITAMIN C) 1000 MG tablet Take 1,000 mg by mouth 2 (two) times daily.   Marland Kitchen aspirin 81 MG tablet Take 81 mg by mouth daily.  Marland Kitchen azelastine (ASTELIN) 0.1 % nasal spray One to two sprays each nostril twice a day as needed.  . cetirizine (ZYRTEC) 10 MG tablet Take 10 mg by mouth daily.  . Cholecalciferol (VITAMIN D-3) 25 MCG (1000 UT) CAPS Take 10,000 Units by mouth daily.   . diclofenac Sodium (VOLTAREN) 1 % GEL Apply topically 4 (four) times daily as needed.   . dicyclomine (BENTYL) 10 MG capsule Take 1 capsule (10 mg total)  by mouth 3 (three) times daily as needed for spasms.  . diphenoxylate-atropine (LOMOTIL) 2.5-0.025 MG tablet Take 1 tablet by mouth 4 (four) times daily as needed for diarrhea or loose stools.  Marland Kitchen EPINEPHrine (EPIPEN 2-PAK) 0.3 mg/0.3 mL IJ SOAJ injection Use as directed for severe allergic reactions  . estradiol (ESTRACE) 0.1 MG/GM vaginal cream estradiol 0.01% (0.1 mg/gram) vaginal cream  . HYDROcodone-acetaminophen (NORCO/VICODIN) 5-325 MG tablet hydrocodone 5 mg-acetaminophen 325 mg tablet  TAKE 1 TABLET BY MOUTH THREE TIMES DAILY AS NEEDED FOR 7 DAYS  . lipase/protease/amylase (CREON) 36000 UNITS CPEP capsule Take 2 capsules by mouth with each meal, 1 capsule by mouth with each snack  . Magnesium 200 MG TABS Take 1 tablet (200 mg total) by mouth daily.  . Multiple Vitamins-Minerals (CENTRUM SILVER 50+WOMEN PO) Take 1 tablet by mouth daily.  . naloxone (NARCAN) 4 MG/0.1ML LIQD nasal spray kit Narcan 4 mg/actuation nasal spray  PLACE 1 SPRAY INTO EACH NOSTRIL FOR SUSPECTED OPIOID OVERDOSE. MAY REPEAT 2 - 3 MINUTES WITH NEW BOTTLE IN OTHER NOSTRIL  . Omega-3 Fatty Acids (FISH OIL BURP-LESS) 1000 MG CAPS Takes 1000 mg BID  . omeprazole (PRILOSEC) 40 MG capsule TAKE 1 CAPSULE BY MOUTH ONCE DAILY  . ondansetron (ZOFRAN) 4 MG tablet Take 1 tablet (4 mg total) by mouth every  8 (eight) hours as needed.  . rosuvastatin (CRESTOR) 5 MG tablet Take 1 tablet by mouth 2X's a week  . sertraline (ZOLOFT) 25 MG tablet Take 1 tablet (25 mg total) by mouth daily.  . vitamin B-12 (CYANOCOBALAMIN) 1000 MCG tablet Take 1,000 mcg by mouth 2 (two) times daily.   . vitamin E 400 UNIT capsule Take 400 Units by mouth daily.  Marland Kitchen zinc gluconate 50 MG tablet Take 50 mg by mouth daily.     Allergies:   Bactrim [sulfamethoxazole-trimethoprim], Nitroglycerin, Pentosan polysulfate, Pentosan polysulfate sodium, Promethazine hcl, Sulfamethoxazole-trimethoprim, Losartan, Promethazine hcl, and Lisinopril   Social History   Socioeconomic History  . Marital status: Married    Spouse name: Not on file  . Number of children: 1  . Years of education: BS  . Highest education level: Not on file  Occupational History  . Occupation: retired  . Occupation: Pharmacist, hospital    Comment: Kindergarten  Tobacco Use  . Smoking status: Never Smoker  . Smokeless tobacco: Never Used  Vaping Use  . Vaping Use: Never used  Substance and Sexual Activity  . Alcohol use: Yes    Comment: social  . Drug use: No  . Sexual activity: Not Currently  Other Topics Concern  . Not on file  Social History Narrative   Lives at home with husband.   No daily use of caffeine.   Right-handed.         Social Determinants of Health   Financial Resource Strain: Not on file  Food Insecurity: Not on file  Transportation Needs: Not on file  Physical Activity: Not on file  Stress: Not on file  Social Connections: Not on file     Family History:  The patient's family history includes Allergic rhinitis in her sister and sister; Asthma in her mother; Breast cancer in her maternal aunt; Cirrhosis in her father; Colon cancer (age of onset: 66) in her sister; Coronary artery disease in her maternal grandfather and mother; Dementia in her mother; Diabetes in her father, paternal aunt, and paternal grandmother; Food Allergy in  her sister; Hypertension in her mother; Renal cancer in her paternal aunt; Stroke in her  mother. There is no history of Esophageal cancer or Stomach cancer.  ROS:   Please see the history of present illness.  All other systems are reviewed and otherwise negative.    EKGs/Labs/Other Studies Reviewed:    Studies reviewed are outlined and summarized above. Reports included below if pertinent.  2D Echo 06/2018 - Left ventricle: The cavity size was normal. Systolic function was  normal. The estimated ejection fraction was in the range of 55%  to 60%. Wall motion was normal; there were no regional wall  motion abnormalities. Doppler parameters are consistent with  abnormal left ventricular relaxation (grade 1 diastolic  dysfunction).  - Aortic valve: There was mild regurgitation.  - Left atrium: The atrium was mildly dilated.  - Atrial septum: No defect or patent foramen ovale was identified.   ABIs 12/2018 Summary:  Right: Resting right ankle-brachial index is within normal range. No  evidence of significant right lower extremity arterial disease. The right  toe-brachial index is normal.   Left: Resting left ankle-brachial index is within normal range. No  evidence of significant left lower extremity arterial disease. The left  toe-brachial index is normal.    *See table(s) above for measurements and observations.  Electronically signed by Jenkins Rouge MD on 01/13/2019 at 4:58:26 PM.    Coronary CT 2018 Aorta:  Normal size.  No calcifications.  No dissection.  Aortic Valve:  Trileaflet.  No calcifications.  Coronary Arteries:  Normal coronary origin.  Right dominance.  RCA is a large dominant artery that gives rise to PDA and PLVB. There is diffuse mixed plaque in the mid and distal RCA mostly with 25-50% stenosis. In the mid portion there is focal 50-69% stenosis and another focal 50-69% stenosis in the distal RCA. PDA has small lumen, that might represent a  stenosis or a poor dilatation with sl NTG.  Left main is a large long artery that gives rise to LAD, ramus intermedius and LCX arteries. Left main has no plaque.  LAD is a large vessel that gives rise to one large diagonal branch. Proximal LAD has a mild calcified plaque with associated stenosis 25-50%. After takeoff of the diagonal branch the LAD lumen decreases significantly without any obvious plaque.  RI is a very small artery.  LCX is a non-dominant artery that gives rise to one OM1 branch. There is no plaque.  Other findings:  Normal pulmonary vein drainage into the left atrium.  Normal let atrial appendage without a thrombus.  IMPRESSION: 1. Coronary calcium score of 56. This was 62 percentile for age and sex matched control.  2. Normal coronary origin with right dominance.  3. At least moderate CAD in RCA/PDA and narrowing of the mid to distal LAD. We will send the study for further evaluation with CT FFR. 1. Left Main:  No significant stenosis.  2. LAD: No significant stenosis.  FFR 0.97. 3. LCX: No significant stenosis.  FFR 0.94. 4. RCA: No significant stenosis.  Minimum FFR 0.92.  In PLVB 0.90.  IMPRESSION: 1.  CT FFR analysis didn't show any significant stenosis.  Electronically Signed   By: Ena Dawley   On: 11/05/2016 13:29    EKG:  EKG is ordered today, personally reviewed, normal sinus rhythm 64 bpm, left anterior fascicular block, poor R wave progression in the anterior leads.  Unchanged from prior.  Recent Labs: 02/03/2020: TSH 3.450 03/02/2020: Magnesium 2.0 05/10/2020: Hemoglobin 11.9; Platelets 234.0 07/20/2020: ALT 25; BUN 20; Creatinine, Ser 0.84; Potassium 4.2; Sodium 139  Recent Lipid Panel    Component Value Date/Time   CHOL 166 04/14/2020 0857   TRIG 204 (H) 04/14/2020 0857   TRIG 181 (H) 07/23/2006 1101   HDL 62 04/14/2020 0857   CHOLHDL 2.7 04/14/2020 0857   CHOLHDL 2 05/03/2018 0913   VLDL 19.0 05/03/2018 0913    LDLCALC 71 04/14/2020 0857   LDLDIRECT 114.0 05/02/2016 0919    PHYSICAL EXAM:    VS:  BP 134/76   Pulse 68   Ht 5' 2"  (1.575 m)   Wt 153 lb (69.4 kg)   SpO2 98%   BMI 27.98 kg/m   BMI: Body mass index is 27.98 kg/m.  GEN: Well nourished, well developed WF, in no acute distress HEENT: normocephalic, atraumatic Neck: no JVD, carotid bruits, or masses Cardiac: RRR; no murmurs, rubs, or gallops, no edema  Respiratory:  clear to auscultation bilaterally, normal work of breathing GI: soft, nontender, nondistended, + BS MS: no deformity or atrophy Skin: warm and dry, faint reddish papular rash to lower extremities below the knee (no petechiae or warmth, no focal areas of induration or macules) Neuro:  Alert and Oriented x 3, Strength and sensation are intact, follows commands Psych: euthymic mood, full affect  Wt Readings from Last 3 Encounters:  08/02/20 153 lb (69.4 kg)  07/20/20 153 lb (69.4 kg)  06/14/20 152 lb (68.9 kg)     ASSESSMENT & PLAN:   1. CAD -she is experiencing new atypical chest pain that is left-sided however she had moderate nonobstructive CAD on coronary CTA in 2018, we will repeat now.  EKG today is unchanged from prior. 2. Hyperlipidemia - continue Crestor 3x/week, her LDL is 71 and triglycerides 204, given elevated A1c she will probably need higher doses, I will discuss with the lipid clinic.  Repeat lipids today, she is advised about necessity of Mediterranean style diet and avoidance of processed sugars. 3. Essential HTN - blood pressure controlled.  At home in 120s. 4. Mildly dilated ascending aorta with mild aortic insufficiency -most recent echocardiogram in December 2021 showed mild to moderate aortic insufficiency and ascending aorta measuring 38 mm.   5. Palpitations -these are new and associated with dizziness, will obtain 7-day Zio patch monitor.  Disposition: F/u with Dr. Meda Coffee in 3 months.  Medication Adjustments/Labs and Tests Ordered: Current  medicines are reviewed at length with the patient today.  Concerns regarding medicines are outlined above. Medication changes, Labs and Tests ordered today are summarized above and listed in the Patient Instructions accessible in Encounters.   Signed, Ena Dawley, MD  08/02/2020 8:48 AM    [address], [place], [place]  [address] Phone: [phone]; Fax: [phone]

## 2020-08-02 NOTE — Patient Instructions (Signed)
Medication Instructions:   Your physician recommends that you continue on your current medications as directed. Please refer to the Current Medication list given to you today.  *If you need a refill on your cardiac medications before your next appointment, please call your pharmacy*   Lab Work:  TODAY--BMET AND LIPIDS  If you have labs (blood work) drawn today and your tests are completely normal, you will receive your results only by: Marland Kitchen MyChart Message (if you have MyChart) OR . A paper copy in the mail If you have any lab test that is abnormal or we need to change your treatment, we will call you to review the results.   Testing/Procedures:  Your cardiac CT will be scheduled at one of the below locations:   [facility] [address] [place], [place] [address] [phone]  If scheduled at [facility], please arrive at the [facility] main entrance of [facility] 30 minutes prior to test start time. Proceed to the [facility] Radiology Department (first floor) to check-in and test prep.  Please follow these instructions carefully (unless otherwise directed):  On the Night Before the Test: . Be sure to Drink plenty of water. . Do not consume any caffeinated/decaffeinated beverages or chocolate 12 hours prior to your test. . Do not take any antihistamines 12 hours prior to your test.  On the Day of the Test: . Drink plenty of water. Do not drink any water within one hour of the test. . Do not eat any food 4 hours prior to the test. . You may take your regular medications prior to the test.  . Take metoprolol (Lopressor) 50 mg by mouth  two hours prior to test. . FEMALES- please wear underwire-free bra if available  After the Test: . Drink plenty of water. . After receiving IV contrast, you may experience a mild flushed feeling. This is normal. . On occasion, you may experience a mild rash up to 24 hours after the test. This is not dangerous.  If this occurs, you can take Benadryl 25 mg and increase your fluid intake. . If you experience trouble breathing, this can be serious. If it is severe call 911 IMMEDIATELY. If it is mild, please call our office  Once we have confirmed authorization from your insurance company, we will call you to set up a date and time for your test. Based on how quickly your insurance processes prior authorizations requests, please allow up to 4 weeks to be contacted for scheduling your Cardiac CT appointment. Be advised that routine Cardiac CT appointments could be scheduled as many as 8 weeks after your provider has ordered it.  For non-scheduling related questions, please contact the cardiac imaging nurse navigator should you have any questions/concerns: Marchia Bond, Cardiac Imaging Nurse Navigator Burley Saver, Interim Cardiac Imaging Nurse [place] [facility] Direct Office Dial: [phone]   For scheduling needs, including cancellations and rescheduling, please call [place], [phone].   ZIO XT- Long Term Monitor Instructions   Your physician has requested you wear your ZIO patch monitor___7____days.   This is a single patch monitor.  [employer] supplies one patch monitor per enrollment.  Additional stickers are not available.   Please do not apply patch if you will be having a Nuclear Stress Test, Echocardiogram, Cardiac CT, MRI, or Chest Xray during the time frame you would be wearing the monitor. The patch cannot be worn during these tests.  You cannot remove and re-apply  the ZIO XT patch monitor.   Your ZIO patch monitor will be sent USPS Priority mail from [facility] directly to your home address. The monitor may also be mailed to a PO BOX if home delivery is not available.   It may take 3-5 days to receive your monitor after you have been enrolled.   Once you have received you monitor, please review enclosed instructions.  Your monitor has already been  registered assigning a specific monitor serial # to you.   Applying the monitor   Shave hair from upper left chest.   Hold abrader disc by orange tab.  Rub abrader in 40 strokes over left upper chest as indicated in your monitor instructions.   Clean area with 4 enclosed alcohol pads .  Use all pads to assure are is cleaned thoroughly.  Let dry.   Apply patch as indicated in monitor instructions.  Patch will be place under collarbone on left side of chest with arrow pointing upward.   Rub patch adhesive wings for 2 minutes.Remove white label marked "1".  Remove white label marked "2".  Rub patch adhesive wings for 2 additional minutes.   While looking in a mirror, press and release button in center of patch.  A small green light will flash 3-4 times .  This will be your only indicator the monitor has been turned on.     Do not shower for the first 24 hours.  You may shower after the first 24 hours.   Press button if you feel a symptom. You will hear a small click.  Record Date, Time and Symptom in the Patient Log Book.   When you are ready to remove patch, follow instructions on last 2 pages of Patient Log Book.  Stick patch monitor onto last page of Patient Log Book.   Place Patient Log Book in [place] box.  Use locking tab on box and tape box closed securely.  The [employer] has [employer] on it.  Please place in mailbox as soon as possible.  Your physician should have your test results approximately 7 days after the monitor has been mailed back to [facility].   Call [place] at [phone] if you have questions regarding your ZIO XT patch monitor.  Call them immediately if you see an orange light blinking on your monitor.   If your monitor falls off in less than 4 days contact our Monitor department at [phone].  If your monitor becomes loose or falls off after 4 days call [employer] at [phone] for suggestions on securing your monitor.      Follow-Up:  3 MONTHS IN THE OFFICE WITH AN EXTENDER

## 2020-08-05 DIAGNOSIS — N8110 Cystocele, unspecified: Secondary | ICD-10-CM | POA: Diagnosis not present

## 2020-08-05 DIAGNOSIS — N393 Stress incontinence (female) (male): Secondary | ICD-10-CM | POA: Diagnosis not present

## 2020-08-05 DIAGNOSIS — Z6828 Body mass index (BMI) 28.0-28.9, adult: Secondary | ICD-10-CM | POA: Diagnosis not present

## 2020-08-05 DIAGNOSIS — Z124 Encounter for screening for malignant neoplasm of cervix: Secondary | ICD-10-CM | POA: Diagnosis not present

## 2020-08-06 ENCOUNTER — Ambulatory Visit
Admission: RE | Admit: 2020-08-06 | Discharge: 2020-08-06 | Disposition: A | Discharge status: Home / Self Care | Payer: Medicare Other | Source: Ambulatory Visit | Attending: Neurology | Admitting: Neurology

## 2020-08-06 ENCOUNTER — Other Ambulatory Visit: Payer: Self-pay

## 2020-08-06 DIAGNOSIS — R413 Other amnesia: Secondary | ICD-10-CM

## 2020-08-06 MED ORDER — GADOBENATE DIMEGLUMINE 529 MG/ML IV SOLN
14.0000 mL | Freq: Once | INTRAVENOUS | Status: AC | PRN
  Administered 2020-08-06: 14 mL via INTRAVENOUS

## 2020-08-08 ENCOUNTER — Telehealth: Payer: Self-pay | Admitting: Neurology

## 2020-08-08 NOTE — Telephone Encounter (Signed)
  Called the patient.  MRI of the brain shows minimal changes from 5 years ago, some slight atrophy and mild small vessel disease.  Nothing that would be causing sharp head pains, suspect she has "ice pick pains".  We will need to follow the memory issues over time.  MRI brain 08/06/20:  IMPRESSION: This MRI of the brain with and without contrast shows the following: 1.   Mild generalized cortical atrophy that has mildly progressed compared to the 07/17/2015 MRI. 2.   Scattered T2/FLAIR hyperintense foci in the hemispheres most consistent with mild chronic microvascular ischemic changes, mildly progressed compared to the 2016 MRI. 3.   No acute findings. 4.   Normal enhancement pattern.

## 2020-08-09 DIAGNOSIS — Z1272 Encounter for screening for malignant neoplasm of vagina: Secondary | ICD-10-CM | POA: Diagnosis not present

## 2020-08-09 DIAGNOSIS — Z90710 Acquired absence of both cervix and uterus: Secondary | ICD-10-CM | POA: Diagnosis not present

## 2020-08-12 DIAGNOSIS — I2510 Atherosclerotic heart disease of native coronary artery without angina pectoris: Secondary | ICD-10-CM

## 2020-08-12 DIAGNOSIS — R002 Palpitations: Secondary | ICD-10-CM

## 2020-08-12 DIAGNOSIS — R072 Precordial pain: Secondary | ICD-10-CM

## 2020-08-17 ENCOUNTER — Other Ambulatory Visit: Payer: Self-pay | Admitting: Internal Medicine

## 2020-08-17 ENCOUNTER — Encounter: Payer: Self-pay | Admitting: Internal Medicine

## 2020-08-17 ENCOUNTER — Ambulatory Visit (INDEPENDENT_AMBULATORY_CARE_PROVIDER_SITE_OTHER): Payer: Medicare Other | Admitting: Internal Medicine

## 2020-08-17 ENCOUNTER — Other Ambulatory Visit: Payer: Self-pay

## 2020-08-17 VITALS — BP 132/74 | HR 78 | Temp 98.2°F | Ht 62.0 in | Wt 153.0 lb

## 2020-08-17 DIAGNOSIS — W57XXXA Bitten or stung by nonvenomous insect and other nonvenomous arthropods, initial encounter: Secondary | ICD-10-CM

## 2020-08-17 DIAGNOSIS — I2510 Atherosclerotic heart disease of native coronary artery without angina pectoris: Secondary | ICD-10-CM | POA: Diagnosis not present

## 2020-08-17 DIAGNOSIS — L03317 Cellulitis of buttock: Secondary | ICD-10-CM | POA: Diagnosis not present

## 2020-08-17 DIAGNOSIS — L0390 Cellulitis, unspecified: Secondary | ICD-10-CM | POA: Insufficient documentation

## 2020-08-17 DIAGNOSIS — Z23 Encounter for immunization: Secondary | ICD-10-CM

## 2020-08-17 DIAGNOSIS — S30860A Insect bite (nonvenomous) of lower back and pelvis, initial encounter: Secondary | ICD-10-CM | POA: Diagnosis not present

## 2020-08-17 DIAGNOSIS — L0231 Cutaneous abscess of buttock: Secondary | ICD-10-CM | POA: Insufficient documentation

## 2020-08-17 MED ORDER — CEFTRIAXONE SODIUM 1 G IJ SOLR
1.0000 g | Freq: Once | INTRAMUSCULAR | Status: AC
  Administered 2020-08-17: 1 g via INTRAMUSCULAR

## 2020-08-17 MED ORDER — DOXYCYCLINE HYCLATE 100 MG PO TABS: 100.0000 mg | ORAL_TABLET | Freq: Two times a day (BID) | ORAL | 0 refills | Status: DC

## 2020-08-17 MED FILL — DOXYCYCLINE HYCLATE 100 MG: 100 | 10 days supply | Qty: 20 | Fill #0

## 2020-08-17 NOTE — Patient Instructions (Addendum)
You had a tetanus vaccine today.  You had an antibiotic injection today.   Start doxycycline twice daily.      Tylenol you can take up to 3000 mg a day.   Take advil 800 mg three times a day.

## 2020-08-17 NOTE — Progress Notes (Signed)
Subjective:    Patient ID: [name], female    DOB: 12-13-1945, 75 y.o.   MRN: [id number]  HPI The patient is here for an acute visit.  On 1/12 she was raking in yard - that night she had discomofrt in left buttock region.  The next day she realized it was red and swollen.  Over the next few days it has gotten worse and continues to get worse.   The area is red, warm and tender.  There was a white blister area that she popped and there was a little liquid in it.  She has developed a couple of other blisters.  She denies fever but does have some fatigue.   Medications and allergies reviewed with patient and updated if appropriate.  Patient Active Problem List   Diagnosis Date Noted  . Right ankle effusion 05/10/2020  . Elevated alkaline phosphatase level 04/20/2020  . Neuralgia 04/20/2020  . Swelling 12/19/2018  . 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 Atypical chest pain 05/21/2018  . Epigastric pain 05/21/2018  . Baker's cyst of knee, right 05/07/2018  . Perennial and seasonal allergic rhinitis 04/18/2018  . Onychomycosis of toenail 03/04/2018  . Hypercalcemia 09/17/2017  . Left wrist pain 09/17/2017  . Synovitis of left knee 05/22/2017  . Diabetes ([place]) 05/02/2017  . Trigger point of left shoulder region 03/27/2017  . Taste disorder 02/22/2017  . Anxiety and depression 02/12/2017  . Caregiver with fatigue 01/17/2017  . Psychosocial stressors 01/17/2017  . Decreased sense of taste 01/15/2017  . At high risk for falls 11/16/2016  . Family history of osteoporosis 11/16/2016  . Atherosclerotic plaque 11/07/2016  . 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 Renal insufficiency 06/29/2016  . Osteoporosis 06/21/2016  . Dizziness 05/16/2016  . Arthritis of left shoulder region 01/17/2016  . Ankylosing spondylitis ([place]) 11/08/2015  . Incontinence 10/28/2015  . Thoracic back pain 10/19/2015  . Leg  cramping 09/29/2015  . 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 Impingement syndrome of left shoulder region 09/22/2014  . Interstitial cystitis 07/09/2014  . Diverticulosis of colon without hemorrhage 07/08/2014  . Carpal tunnel syndrome of left wrist 04/12/2014  . Food allergy 03/06/2014  . Renal calculi 01/30/2014  . OSA (obstructive sleep apnea), moderate 01/14/2014  . Allergic conjunctivitis 12/06/2013  . History of Bell's palsy 02/26/2012  . Vitamin D deficiency 06/27/2010  . 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  . DRY MOUTH 06/27/2010  . ABDOMINAL PAIN, SUPRAPUBIC 05/25/2009  . Gastroesophageal reflux disease 04/30/2009  . History of colonic polyps 04/30/2009  . COSTOCHONDRITIS 01/15/2008  . HYPERLIPIDEMIA 08/21/2007  . Essential hypertension 08/21/2007  . Arthralgia 08/21/2007  . Chronic lumbar radiculopathy 01/25/2007    Current Outpatient Medications on File Prior to Visit  Medication Sig Dispense Refill  . acetaminophen (TYLENOL) 500 MG tablet Take 1,000 mg by mouth 3 (three) times daily as needed.     . ALPRAZolam (XANAX) 0.5 MG tablet Take 2 tablets approximately 45 minutes prior to the MRI study, take a third tablet if needed. 3 tablet 0  . amLODipine (NORVASC) 2.5 MG tablet Take 1 tablet (2.5 mg total) by mouth daily. 90 tablet 2  . Ascorbic Acid (VITAMIN C) 1000 MG tablet Take 1,000 mg by mouth 2 (two) times daily.     Marland Kitchen aspirin 81 MG tablet Take 81 mg by mouth daily.    Marland Kitchen azelastine (ASTELIN) 0.1 % nasal spray One to  two sprays each nostril twice a day as needed. 30 mL 5  . cetirizine (ZYRTEC) 10 MG tablet Take 10 mg by mouth daily.    . Cholecalciferol (VITAMIN D-3) 25 MCG (1000 UT) CAPS Take 10,000 Units by mouth daily.     . diclofenac Sodium (VOLTAREN) 1 % GEL Apply topically 4 (four) times daily as needed.     . dicyclomine (BENTYL) 10 MG capsule Take 1 capsule (10 mg total) by mouth 3 (three) times daily as needed for spasms. 90 capsule 2  .  diphenoxylate-atropine (LOMOTIL) 2.5-0.025 MG tablet Take 1 tablet by mouth 4 (four) times daily as needed for diarrhea or loose stools. 90 tablet 2  . EPINEPHrine (EPIPEN 2-PAK) 0.3 mg/0.3 mL IJ SOAJ injection Use as directed for severe allergic reactions 2 each 1  . estradiol (ESTRACE) 0.1 MG/GM vaginal cream estradiol 0.01% (0.1 mg/gram) vaginal cream    . HYDROcodone-acetaminophen (NORCO/VICODIN) 5-325 MG tablet hydrocodone 5 mg-acetaminophen 325 mg tablet  TAKE 1 TABLET BY MOUTH THREE TIMES DAILY AS NEEDED FOR 7 DAYS    . lipase/protease/amylase (CREON) 36000 UNITS CPEP capsule Take 2 capsules by mouth with each meal, 1 capsule by mouth with each snack 720 capsule 1  . Magnesium 200 MG TABS Take 1 tablet (200 mg total) by mouth daily. 30 each 6  . Multiple Vitamins-Minerals (CENTRUM SILVER 50+WOMEN PO) Take 1 tablet by mouth daily.    . naloxone (NARCAN) 4 MG/0.1ML LIQD nasal spray kit Narcan 4 mg/actuation nasal spray  PLACE 1 SPRAY INTO EACH NOSTRIL FOR SUSPECTED OPIOID OVERDOSE. MAY REPEAT 2 - 3 MINUTES WITH NEW BOTTLE IN OTHER NOSTRIL    . Omega-3 Fatty Acids (FISH OIL BURP-LESS) 1000 MG CAPS Takes 1000 mg BID 60 capsule 0  . omeprazole (PRILOSEC) 40 MG capsule TAKE 1 CAPSULE BY MOUTH ONCE DAILY 90 capsule 2  . ondansetron (ZOFRAN) 4 MG tablet Take 1 tablet (4 mg total) by mouth every 8 (eight) hours as needed. 40 tablet 2  . rosuvastatin (CRESTOR) 5 MG tablet Take 1 tablet by mouth 2X's a week 90 tablet 3  . sertraline (ZOLOFT) 25 MG tablet Take 1 tablet (25 mg total) by mouth daily. 90 tablet 1  . vitamin B-12 (CYANOCOBALAMIN) 1000 MCG tablet Take 1,000 mcg by mouth 2 (two) times daily.     . vitamin E 400 UNIT capsule Take 400 Units by mouth daily.    Marland Kitchen zinc gluconate 50 MG tablet Take 50 mg by mouth daily.    . metoprolol tartrate (LOPRESSOR) 50 MG tablet Take 1 tablet (50 mg total) by mouth once for 1 dose. Take 2 hours prior to your Coronary CT. 1 tablet 0   No current  facility-administered medications on file prior to visit.    Past Medical History:  Diagnosis Date  . Allergy    seasonal  . Ankylosing spondylitis ([place])   . Anxiety   . Arthritis   . Bell palsy   . CAD (coronary artery disease)    a. Coronary CT 10/2016 showed calcium score in the 59th perecntile, moderate CAD in RCA/PDA and narrowing of the mid to distal LAD, with negative FFR.  . CTS (carpal tunnel syndrome)   . Cystitis   . Diabetes mellitus without complication ([place])   . Diverticulitis   . Diverticulosis of colon   . Dry eye syndrome   . Exocrine pancreatic insufficiency   . Frozen shoulder   . GERD (gastroesophageal reflux disease)   . HLA B27 (  HLA B27 positive)   . Hx of colonic polyps   . Hyperlipidemia    NMR 2005  . Hypertension   . Hypothyroidism   . Incontinence   . Internal hemorrhoids   . Interstitial cystitis   . Kidney stone   . Menopause   . Mild aortic insufficiency   . Mild ascending aorta dilatation ([facility])   . Osteoarthritis   . Osteopenia    [facility] [place]   . S/P total hysterectomy and bilateral salpingo-oophorectomy 04/12/2014  . Sinusitis   . Sleep apnea    c-pap  . Subjective visual disturbance of both eyes 12/30/2013  . Tubular adenoma of colon   . Vitamin D deficiency   . Xerostomia    and Xeroophthalmia    Past Surgical History:  Procedure Laterality Date  . BLADDER SURGERY     for incontinence  . CARPAL TUNNEL RELEASE Left 12/22/2014   Procedure: LEFT CARPAL TUNNEL RELEASE;  Surgeon: Daryll Brod, MD;  Location: [place];  Service: Orthopedics;  Laterality: Left;  . CARPAL TUNNEL RELEASE Right 02/18/2019   Procedure: RIGHT CARPAL TUNNEL RELEASE;  Surgeon: Daryll Brod, MD;  Location: [place];  Service: Orthopedics;  Laterality: Right;  . COLONOSCOPY W/ POLYPECTOMY     adenomatous poyp 04-2008  . colonscopy     tics 10-2002  . CYSTOSCOPY WITH RETROGRADE PYELOGRAM,  URETEROSCOPY AND STENT PLACEMENT Left 09/01/2013   Procedure: CYSTOSCOPY WITH RETROGRADE PYELOGRAM, AND LEFT STENT PLACEMENT;  Surgeon: Molli Hazard, MD;  Location: [facility];  Service: Urology;  Laterality: Left;  . elevated LFTs     due to Elmiron  . FINGER SURGERY Left 2004   2nd finger  . G 1 P 1    . interstitial cystitis     with clot in catheter  . PARTIAL HYSTERECTOMY     with one ovary left  . SHOULDER ARTHROSCOPY Left   . TOTAL ABDOMINAL HYSTERECTOMY W/ BILATERAL SALPINGOOPHORECTOMY     dysfunctional menses    Social History   Socioeconomic History  . Marital status: Married    Spouse name: Not on file  . Number of children: 1  . Years of education: BS  . Highest education level: Not on file  Occupational History  . Occupation: retired  . Occupation: Pharmacist, hospital    Comment: Kindergarten  Tobacco Use  . Smoking status: Never Smoker  . Smokeless tobacco: Never Used  Vaping Use  . Vaping Use: Never used  Substance and Sexual Activity  . Alcohol use: Yes    Comment: social  . Drug use: No  . Sexual activity: Not Currently  Other Topics Concern  . Not on file  Social History Narrative   Lives at home with husband.   No daily use of caffeine.   Right-handed.         Social Determinants of Health   Financial Resource Strain: Not on file  Food Insecurity: Not on file  Transportation Needs: Not on file  Physical Activity: Not on file  Stress: Not on file  Social Connections: Not on file    Family History  Problem Relation Age of Onset  . Coronary artery disease Mother   . Hypertension Mother   . Asthma Mother   . Dementia Mother   . Stroke Mother        mini cva  . Diabetes Father   . Cirrhosis Father  non alcoholic  . Colon cancer Sister 72  . Allergic rhinitis Sister   . Allergic rhinitis Sister   . Food Allergy Sister   . Renal cancer Paternal Aunt        x 2  . Coronary artery disease Maternal Grandfather   . Breast cancer Maternal  Aunt   . Diabetes Paternal Aunt   . Diabetes Paternal Grandmother   . Esophageal cancer Neg Hx   . Stomach cancer Neg Hx     Review of Systems  Constitutional: Positive for fatigue. Negative for chills and fever.  Skin: Positive for color change and wound.       Objective:   Vitals:   08/17/20 1557  BP: 132/74  Pulse: 78  Temp: 98.2 F (36.8 C)  SpO2: 96%   BP Readings from Last 3 Encounters:  08/17/20 132/74  08/02/20 134/76  07/20/20 (!) 148/88   Wt Readings from Last 3 Encounters:  08/17/20 153 lb (69.4 kg)  08/02/20 153 lb (69.4 kg)  07/20/20 153 lb (69.4 kg)   Body mass index is 27.98 kg/m.   Physical Exam Constitutional:      General: She is not in acute distress.    Appearance: Normal appearance. She is not ill-appearing.  Skin:    General: Skin is warm and dry.     Findings: Erythema (area of erythema approximately the size of  a medium orange with indurated area the size of a tangerine.  area is tender and warm.  a couple of small white blisters in center..  no fluctuance) present.  Neurological:     Mental Status: She is alert.            Assessment & Plan:    See Problem List for Assessment and Plan of chronic medical problems.    This visit occurred during the SARS-CoV-2 public health emergency.  Safety protocols were in place, including screening questions prior to the visit, additional usage of staff PPE, and extensive cleaning of exam room while observing appropriate contact time as indicated for disinfecting solutions.

## 2020-08-17 NOTE — Assessment & Plan Note (Signed)
Acute Started one week ago - ? Bit by something outside Area of induration, but no obvious abscess tdap today Ceftriaxone 2 gm IM x 1 Start doxycyline 100 mg BID x 10 days Advil, tylenol for pain Apply heat  She will monitor closely and call if no improvement or worsening over next 48 hrs

## 2020-08-19 ENCOUNTER — Telehealth: Payer: Self-pay

## 2020-08-19 MED FILL — OMEPRAZOLE 40 MG CPDR: 40 | 90 days supply | Qty: 90 | Fill #1

## 2020-08-19 MED FILL — ROSUVASTATIN CALCIUM 5 MG T: 5 | 90 days supply | Qty: 90 | Fill #1

## 2020-08-19 NOTE — Telephone Encounter (Signed)
As we discussed I will see her early tomorrow - I can squeeze her in between pts if needed

## 2020-08-19 NOTE — Progress Notes (Signed)
Subjective:    Patient ID: [name], female    DOB: [birth year], 75 y.o.   MRN: [id number]  HPI The patient is here for follow up cellulitis of the L buttock.   She is taking the doxycycline.  There may have been a slight improvement since she was here 3 days ago.  There has been a small amount of drainage-little blood, little pus.  She has been applying heat and sitting in the warm tub.  The area is painful and it is hard for her to sit too long.  She denies any fevers or chills.   Medications and allergies reviewed with patient and updated if appropriate.  Patient Active Problem List   Diagnosis Date Noted  . Cellulitis 08/17/2020  . Right ankle effusion 05/10/2020  . Elevated alkaline phosphatase level 04/20/2020  . Neuralgia 04/20/2020  . Swelling 12/19/2018  . 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 Atypical chest pain 05/21/2018  . Epigastric pain 05/21/2018  . Baker's cyst of knee, right 05/07/2018  . Perennial and seasonal allergic rhinitis 04/18/2018  . Onychomycosis of toenail 03/04/2018  . Hypercalcemia 09/17/2017  . Left wrist pain 09/17/2017  . Synovitis of left knee 05/22/2017  . Diabetes ([place]) 05/02/2017  . Trigger point of left shoulder region 03/27/2017  . Taste disorder 02/22/2017  . Anxiety and depression 02/12/2017  . Caregiver with fatigue 01/17/2017  . Psychosocial stressors 01/17/2017  . Decreased sense of taste 01/15/2017  . At high risk for falls 11/16/2016  . Family history of osteoporosis 11/16/2016  . Atherosclerotic plaque 11/07/2016  . 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 Renal insufficiency 06/29/2016  . Osteoporosis 06/21/2016  . Dizziness 05/16/2016  . Arthritis of left shoulder region 01/17/2016  . Ankylosing spondylitis ([place]) 11/08/2015  . Incontinence 10/28/2015  . Thoracic back pain 10/19/2015  . Leg cramping 09/29/2015  . Primary osteoarthritis of  left shoulder 08/03/2015  . Impingement syndrome of left shoulder region 09/22/2014  . Interstitial cystitis 07/09/2014  . Diverticulosis of colon without hemorrhage 07/08/2014  . Carpal tunnel syndrome of left wrist 04/12/2014  . Food allergy 03/06/2014  . Renal calculi 01/30/2014  . OSA (obstructive sleep apnea), moderate 01/14/2014  . Allergic conjunctivitis 12/06/2013  . History of Bell's palsy 02/26/2012  . Vitamin D deficiency 06/27/2010  . 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  . DRY MOUTH 06/27/2010  . ABDOMINAL PAIN, SUPRAPUBIC 05/25/2009  . Gastroesophageal reflux disease 04/30/2009  . History of colonic polyps 04/30/2009  . COSTOCHONDRITIS 01/15/2008  . HYPERLIPIDEMIA 08/21/2007  . Essential hypertension 08/21/2007  . Arthralgia 08/21/2007  . Chronic lumbar radiculopathy 01/25/2007    Current Outpatient Medications on File Prior to Visit  Medication Sig Dispense Refill  . acetaminophen (TYLENOL) 500 MG tablet Take 1,000 mg by mouth 3 (three) times daily as needed.     Marland Kitchen amLODipine (NORVASC) 2.5 MG tablet Take 1 tablet (2.5 mg total) by mouth daily. 90 tablet 2  . Ascorbic Acid (VITAMIN C) 1000 MG tablet Take 1,000 mg by mouth 2 (two) times daily.     Marland Kitchen aspirin 81 MG tablet Take 81 mg by mouth daily.    Marland Kitchen azelastine (ASTELIN) 0.1 % nasal spray One to two sprays each nostril twice a day as needed. 30 mL 5  . cetirizine (ZYRTEC) 10 MG tablet Take 10 mg by mouth daily.    . Cholecalciferol (VITAMIN D-3) 25 MCG (1000 UT) CAPS Take 10,000 Units  by mouth daily.     . diclofenac Sodium (VOLTAREN) 1 % GEL Apply topically 4 (four) times daily as needed.     . dicyclomine (BENTYL) 10 MG capsule Take 1 capsule (10 mg total) by mouth 3 (three) times daily as needed for spasms. 90 capsule 2  . diphenoxylate-atropine (LOMOTIL) 2.5-0.025 MG tablet Take 1 tablet by mouth 4 (four) times daily as needed for diarrhea or loose stools. 90 tablet 2  . doxycycline (VIBRA-TABS) 100 MG tablet Take 1 tablet  (100 mg total) by mouth 2 (two) times daily. 20 tablet 0  . EPINEPHrine (EPIPEN 2-PAK) 0.3 mg/0.3 mL IJ SOAJ injection Use as directed for severe allergic reactions 2 each 1  . estradiol (ESTRACE) 0.1 MG/GM vaginal cream estradiol 0.01% (0.1 mg/gram) vaginal cream    . HYDROcodone-acetaminophen (NORCO/VICODIN) 5-325 MG tablet hydrocodone 5 mg-acetaminophen 325 mg tablet  TAKE 1 TABLET BY MOUTH THREE TIMES DAILY AS NEEDED FOR 7 DAYS    . lipase/protease/amylase (CREON) 36000 UNITS CPEP capsule Take 2 capsules by mouth with each meal, 1 capsule by mouth with each snack 720 capsule 1  . Magnesium 200 MG TABS Take 1 tablet (200 mg total) by mouth daily. 30 each 6  . Multiple Vitamins-Minerals (CENTRUM SILVER 50+WOMEN PO) Take 1 tablet by mouth daily.    . naloxone (NARCAN) 4 MG/0.1ML LIQD nasal spray kit Narcan 4 mg/actuation nasal spray  PLACE 1 SPRAY INTO EACH NOSTRIL FOR SUSPECTED OPIOID OVERDOSE. MAY REPEAT 2 - 3 MINUTES WITH NEW BOTTLE IN OTHER NOSTRIL    . Omega-3 Fatty Acids (FISH OIL BURP-LESS) 1000 MG CAPS Takes 1000 mg BID 60 capsule 0  . omeprazole (PRILOSEC) 40 MG capsule TAKE 1 CAPSULE BY MOUTH ONCE DAILY 90 capsule 2  . ondansetron (ZOFRAN) 4 MG tablet Take 1 tablet (4 mg total) by mouth every 8 (eight) hours as needed. 40 tablet 2  . rosuvastatin (CRESTOR) 5 MG tablet Take 1 tablet by mouth 2X's a week 90 tablet 3  . sertraline (ZOLOFT) 25 MG tablet Take 1 tablet (25 mg total) by mouth daily. 90 tablet 1  . vitamin B-12 (CYANOCOBALAMIN) 1000 MCG tablet Take 1,000 mcg by mouth 2 (two) times daily.     . vitamin E 400 UNIT capsule Take 400 Units by mouth daily.    Marland Kitchen zinc gluconate 50 MG tablet Take 50 mg by mouth daily.    . metoprolol tartrate (LOPRESSOR) 50 MG tablet Take 1 tablet (50 mg total) by mouth once for 1 dose. Take 2 hours prior to your Coronary CT. 1 tablet 0   No current facility-administered medications on file prior to visit.    Past Medical History:  Diagnosis Date   . Allergy    seasonal  . Ankylosing spondylitis ([place])   . Anxiety   . Arthritis   . Bell palsy   . CAD (coronary artery disease)    a. Coronary CT 10/2016 showed calcium score in the 59th perecntile, moderate CAD in RCA/PDA and narrowing of the mid to distal LAD, with negative FFR.  . CTS (carpal tunnel syndrome)   . Cystitis   . Diabetes mellitus without complication ([place])   . Diverticulitis   . Diverticulosis of colon   . Dry eye syndrome   . Exocrine pancreatic insufficiency   . Frozen shoulder   . GERD (gastroesophageal reflux disease)   . HLA B27 (HLA B27 positive)   . Hx of colonic polyps   . Hyperlipidemia    NMR  2005  . Hypertension   . Hypothyroidism   . Incontinence   . Internal hemorrhoids   . Interstitial cystitis   . Kidney stone   . Menopause   . Mild aortic insufficiency   . Mild ascending aorta dilatation ([facility])   . Osteoarthritis   . Osteopenia    [facility] [place]   . S/P total hysterectomy and bilateral salpingo-oophorectomy 04/12/2014  . Sinusitis   . Sleep apnea    c-pap  . Subjective visual disturbance of both eyes 12/30/2013  . Tubular adenoma of colon   . Vitamin D deficiency   . Xerostomia    and Xeroophthalmia    Past Surgical History:  Procedure Laterality Date  . BLADDER SURGERY     for incontinence  . CARPAL TUNNEL RELEASE Left 12/22/2014   Procedure: LEFT CARPAL TUNNEL RELEASE;  Surgeon: Daryll Brod, MD;  Location: [place];  Service: Orthopedics;  Laterality: Left;  . CARPAL TUNNEL RELEASE Right 02/18/2019   Procedure: RIGHT CARPAL TUNNEL RELEASE;  Surgeon: Daryll Brod, MD;  Location: [place];  Service: Orthopedics;  Laterality: Right;  . COLONOSCOPY W/ POLYPECTOMY     adenomatous poyp 04-2008  . colonscopy     tics 10-2002  . CYSTOSCOPY WITH RETROGRADE PYELOGRAM, URETEROSCOPY AND STENT PLACEMENT Left 09/01/2013   Procedure: CYSTOSCOPY WITH RETROGRADE PYELOGRAM, AND LEFT  STENT PLACEMENT;  Surgeon: Molli Hazard, MD;  Location[facility];  Service: Urology;  Laterality: Left;  . elevated LFTs     due to [place]miron  . FINGER SURGERY Left 2004   2nd finger  . G 1 P 1    . interstitial cystitis     with clot in catheter  . PARTIAL HYSTERECTOMY     with one ovary left  . SHOULDER ARTHROSCOPY Left   . TOTAL ABDOMINAL HYSTERECTOMY W/ BILATERAL SALPINGOOPHORECTOMY     dysfunctional menses    Social History   Socioeconomic History  . Marital status: Married    Spouse name: Not on file  . Number of children: 1  . Years of education: BS  . Highest education level: Not on file  Occupational History  . Occupation: retired  . Occupation: Pharmacist, hospital    Comment: Kindergarten  Tobacco Use  . Smoking status: Never Smoker  . Smokeless tobacco: Never Used  Vaping Use  . Vaping Use: Never used  Substance and Sexual Activity  . Alcohol use: Yes    Comment: social  . Drug use: No  . Sexual activity: Not Currently  Other Topics Concern  . Not on file  Social History Narrative   Lives at home with husband.   No daily use of caffeine.   Right-handed.         Social Determinants of Health   Financial Resource Strain: Not on file  Food Insecurity: Not on file  Transportation Needs: Not on file  Physical Activity: Not on file  Stress: Not on file  Social Connections: Not on file    Family History  Problem Relation Age of Onset  . Coronary artery disease Mother   . Hypertension Mother   . Asthma Mother   . Dementia Mother   . Stroke Mother        mini cva  . Diabetes Father   . Cirrhosis Father        non alcoholic  . Colon cancer Sister 15  . Allergic rhinitis Sister   . Allergic  rhinitis Sister   . Food Allergy Sister   . Renal cancer Paternal Aunt        x 2  . Coronary artery disease Maternal Grandfather   . Breast cancer Maternal Aunt   . Diabetes Paternal Aunt   . Diabetes Paternal Grandmother   . Esophageal cancer Neg Hx   .  Stomach cancer Neg Hx     Review of Systems     Objective:   Vitals:   08/20/20 1528  BP: 130/78  Pulse: 82  Temp: 98 F (36.7 C)  SpO2: 94%   BP Readings from Last 3 Encounters:  08/20/20 130/78  08/17/20 132/74  08/02/20 134/76   Wt Readings from Last 3 Encounters:  08/20/20 153 lb (69.4 kg)  08/17/20 153 lb (69.4 kg)  08/02/20 153 lb (69.4 kg)   Body mass index is 27.98 kg/m.   Physical Exam    Constitutional: Appears well-developed and well-nourished. No distress.  Skin, L buttock: Area of erythema is slightly smaller than 3 days ago, area of induration is also slightly smaller, but still the size of an egg, area of fluctuance proximately the size of a golf ball, very tender to touch, no active drainage, several small pustules or ulcers where it looks like it is trying to drain, but no drainage with slight pressure, area is slightly warm  Procedure:  I & D Indications: Abscess  Procedure Details Consent:  Verbal consent for procedure obtained. Performed.  The area was cleaned with iodine and alcohol swabs.    Area of abscess sprayed with numbing spray.  A # 12 scalpel was used to make a small incision and with pressure a small amount of bloody pus was extracted.       A dressing was applied.  Patient did tolerate procedure well.    Assessment & Plan:    See Problem List for Assessment and Plan of chronic medical problems.    This visit occurred during the SARS-CoV-2 public health emergency.  Safety protocols were in place, including screening questions prior to the visit, additional usage of staff PPE, and extensive cleaning of exam room while observing appropriate contact time as indicated for disinfecting solutions.

## 2020-08-20 ENCOUNTER — Other Ambulatory Visit: Payer: Self-pay

## 2020-08-20 ENCOUNTER — Other Ambulatory Visit (HOSPITAL_BASED_OUTPATIENT_CLINIC_OR_DEPARTMENT_OTHER): Payer: Self-pay | Admitting: Internal Medicine

## 2020-08-20 ENCOUNTER — Telehealth: Payer: Self-pay | Admitting: Internal Medicine

## 2020-08-20 ENCOUNTER — Ambulatory Visit (INDEPENDENT_AMBULATORY_CARE_PROVIDER_SITE_OTHER): Payer: Medicare Other | Admitting: Internal Medicine

## 2020-08-20 ENCOUNTER — Encounter: Payer: Self-pay | Admitting: Internal Medicine

## 2020-08-20 VITALS — BP 130/78 | HR 82 | Temp 98.0°F | Ht 62.0 in | Wt 153.0 lb

## 2020-08-20 DIAGNOSIS — S30860A Insect bite (nonvenomous) of lower back and pelvis, initial encounter: Secondary | ICD-10-CM | POA: Diagnosis not present

## 2020-08-20 DIAGNOSIS — W57XXXA Bitten or stung by nonvenomous insect and other nonvenomous arthropods, initial encounter: Secondary | ICD-10-CM | POA: Diagnosis not present

## 2020-08-20 DIAGNOSIS — I2510 Atherosclerotic heart disease of native coronary artery without angina pectoris: Secondary | ICD-10-CM | POA: Diagnosis not present

## 2020-08-20 DIAGNOSIS — L03317 Cellulitis of buttock: Secondary | ICD-10-CM | POA: Diagnosis not present

## 2020-08-20 DIAGNOSIS — L0231 Cutaneous abscess of buttock: Secondary | ICD-10-CM

## 2020-08-20 MED ORDER — CEFTRIAXONE SODIUM 1 G IJ SOLR
1.0000 g | Freq: Once | INTRAMUSCULAR | Status: AC
Start: 2020-08-20 — End: 2020-08-20
  Administered 2020-08-20: 1 g via INTRAMUSCULAR

## 2020-08-20 MED ORDER — MUPIROCIN CALCIUM 2 % EX CREA: 1.0000 "application " | TOPICAL_CREAM | Freq: Two times a day (BID) | CUTANEOUS | 2 refills | Status: DC

## 2020-08-20 MED FILL — MUPIROCIN 2% OINTMENT: 2 | 15 days supply | Qty: 22 | Fill #0

## 2020-08-20 NOTE — Assessment & Plan Note (Signed)
Subacute Slight improvement-area of erythema and induration is slightly smaller and there has been some slight drainage There is a slight area of fluctuance that I did attempt to drain-small amount of blood and a small amount of pus was extracted Still with significant area of induration Stressed continuing heat and sitting in warm baths Continue doxycycline 100 mg twice daily-started 3 days ago Ceftriaxone 1 g IM today to help boost her treatment Antibacterial ointment sent to pharmacy She will give me an update on Monday

## 2020-08-20 NOTE — Telephone Encounter (Signed)
Dylan from [employer] called   The prescription for Mupirocin Calcium 2 %  Cream is unavailable. They would like to see if they can substitute the cream for a ointment instead.   Please advise.   The best contact number is [phone]

## 2020-08-20 NOTE — Telephone Encounter (Signed)
Please give verbal for what ever substitute they have.

## 2020-08-20 NOTE — Patient Instructions (Signed)
You received an injection of an antibiotic today.   Continue the doxycyline.    Use the anti-bacterial ointment.  Keep using heat.   Give me an update on Monday or sooner.

## 2020-08-23 NOTE — Telephone Encounter (Signed)
Spoke with Abbe Amsterdam, pharmacist today.

## 2020-08-24 ENCOUNTER — Telehealth (HOSPITAL_COMMUNITY): Payer: Self-pay | Admitting: Emergency Medicine

## 2020-08-24 NOTE — Telephone Encounter (Signed)
Reaching out to patient to offer assistance regarding upcoming cardiac imaging study; pt verbalizes understanding of appt date/time, parking situation and where to check in, pre-test NPO status and medications ordered, and verified current allergies; name and call back number provided for further questions should they arise Marchia Bond RN Navigator Cardiac Imaging [facility] [phone] office [phone] cell  Prior cardiac CTA with no issues with nitro admin. Reports a frozen shoulder Clarise Cruz

## 2020-08-25 ENCOUNTER — Encounter: Payer: Self-pay | Admitting: Internal Medicine

## 2020-08-25 DIAGNOSIS — R072 Precordial pain: Secondary | ICD-10-CM | POA: Diagnosis not present

## 2020-08-25 DIAGNOSIS — R002 Palpitations: Secondary | ICD-10-CM | POA: Diagnosis not present

## 2020-08-25 DIAGNOSIS — I2510 Atherosclerotic heart disease of native coronary artery without angina pectoris: Secondary | ICD-10-CM | POA: Diagnosis not present

## 2020-08-26 ENCOUNTER — Other Ambulatory Visit: Payer: Self-pay

## 2020-08-26 ENCOUNTER — Ambulatory Visit (HOSPITAL_COMMUNITY)
Admission: RE | Admit: 2020-08-26 | Discharge: 2020-08-26 | Disposition: A | Discharge status: Home / Self Care | Payer: Medicare Other | Source: Ambulatory Visit | Attending: Cardiology | Admitting: Cardiology

## 2020-08-26 ENCOUNTER — Encounter (HOSPITAL_COMMUNITY): Payer: Self-pay

## 2020-08-26 DIAGNOSIS — E782 Mixed hyperlipidemia: Secondary | ICD-10-CM

## 2020-08-26 DIAGNOSIS — R931 Abnormal findings on diagnostic imaging of heart and coronary circulation: Secondary | ICD-10-CM | POA: Diagnosis not present

## 2020-08-26 DIAGNOSIS — R002 Palpitations: Secondary | ICD-10-CM

## 2020-08-26 DIAGNOSIS — I2510 Atherosclerotic heart disease of native coronary artery without angina pectoris: Secondary | ICD-10-CM | POA: Diagnosis not present

## 2020-08-26 DIAGNOSIS — I10 Essential (primary) hypertension: Secondary | ICD-10-CM | POA: Diagnosis not present

## 2020-08-26 DIAGNOSIS — R072 Precordial pain: Secondary | ICD-10-CM

## 2020-08-26 MED ORDER — NITROGLYCERIN 0.4 MG SL SUBL
0.8000 mg | SUBLINGUAL_TABLET | Freq: Once | SUBLINGUAL | Status: AC
  Administered 2020-08-26: 0.8 mg via SUBLINGUAL

## 2020-08-26 MED ORDER — ATROPINE SULFATE 1 MG/10ML IJ SOSY
PREFILLED_SYRINGE | INTRAMUSCULAR | Status: AC
  Filled 2020-08-26: qty 10

## 2020-08-26 MED ORDER — NITROGLYCERIN 0.4 MG SL SUBL
SUBLINGUAL_TABLET | SUBLINGUAL | Status: AC
  Filled 2020-08-26: qty 2

## 2020-08-26 MED ORDER — IOHEXOL 350 MG/ML SOLN
100.0000 mL | Freq: Once | INTRAVENOUS | Status: AC | PRN
  Administered 2020-08-26: 100 mL via INTRAVENOUS

## 2020-08-26 NOTE — Progress Notes (Signed)
  Spoke to Dr Meda Coffee on the phone and told her re query allergy to nitro. Low BP, almost died on Nov 26, 2010 but patient states did OK with nitro in 25-Nov-2016 during CTA, does not remember if she had one or 2 nitros. BP 162/81 HR 61 Dr Meda Coffee stated to go ahead and give her 2 nitro and if hypotension occurs can treat with fluids and Atropine if needed.

## 2020-08-27 ENCOUNTER — Telehealth: Payer: Self-pay

## 2020-08-27 DIAGNOSIS — I2510 Atherosclerotic heart disease of native coronary artery without angina pectoris: Secondary | ICD-10-CM

## 2020-08-27 DIAGNOSIS — E785 Hyperlipidemia, unspecified: Secondary | ICD-10-CM

## 2020-08-27 NOTE — Telephone Encounter (Signed)
-----   Message from Dorothy Spark, MD sent at 08/26/2020 10:40 PM EST ----- There is progression of CAD, now moderate, still non-obstructive. She didn't tolerate higher doses of statin and TG still elevated, please refer to the lipid clinic for further management.thank you, KN

## 2020-08-27 NOTE — Telephone Encounter (Signed)
Pt called re: her CT and Monitor..... she verbalized understanding and agreed to [facility] referral as suggested on her CT report.  Pt is going to wait on the Flecainide as noted on her Event Monitor and let us know if her symptoms worsen and she wants to talk with the [facility] further at her Lipid clinic appt. Will note on the referral.   (These are benign arrhythmias, very short, if her symptoms are bothering her, we can try flecainide 50 mg PO BID followed by a GXT in 5-10 days.)

## 2020-08-30 NOTE — Progress Notes (Signed)
Patient ID: [name]                 DOB: August 09, 1945                    MRN: [id number]     HPI: [name] is a 75 y.o. female patient referred to lipid clinic by Dr. Meda Coffee. PMH is significant for moderate CAD by 2022 coronary CTA, aortic atherosclerosis, HTN, HLD, ankylosing spondylitis/HLA B27 positive, anxiety, arthritis, Bell palsy, diverticulosis, pancreatic insufficiency, interstitial cystitis, OA, pre-DM, OSA on CPAP.   Pt last seen on 08/03/19 by Dr. Meda Coffee. Patient with hx of intolerances to higher doses of statins due to myalgias per documentation. Now on rosuvastatin 5 mg 2x/week with occasional myalgia. Additionally, patient reported intermittent left-sided chest pain not related to exertion and new palpitations for lasting about 1-2 minutes occurring several times a week which causes dizziness; therefore, CTA was ordered. CTA on 08/26/20 showed moderate stenosis in the proximal and distal RCA, mild stenosis in the proximal LAD, with calcium score of 199 and in 70th percentile (score 56 with 59th percentile in 2018). CT FFR analysis didn't show any significant stenosis.  Patient presents today in good spirits accompanied with daughter. Reports medication adherence with rosuvastatin 5 mg 3x/weekly. Reports creepy crawling feeling in legs that occurs on/off ~2-3 days weekly - pt not sure if related to statin or back/nerve pain followed by therapist at [facility].  Current Medications: rousvastatin 5 mg 3x/week Intolerances: rosuvastatin 5 mg daily (leg cramping) Risk Factors: coronary calcium score of 199 LDL goal: <70 mg/dL  Diet: eats more chicken and [place]  Exercise: work in the garden but has been limited by shoulder problems.  Family History: CAD in her maternal grandfather and mother; Diabetes in her father, paternal aunt, and paternal grandmother; Hypertension in her mother; Stroke in her mother.  Social History: denies tobacco use  Labs: 08/02/20: LDL 77, TG 182,  TC 169, HDL 62 (rosuvastatin 5 mg 3x/weekly)  Past Medical History:  Diagnosis Date  . Allergy    seasonal  . Ankylosing spondylitis ([place])   . Anxiety   . Arthritis   . Bell palsy   . CAD (coronary artery disease)    a. Coronary CT 10/2016 showed calcium score in the 59th perecntile, moderate CAD in RCA/PDA and narrowing of the mid to distal LAD, with negative FFR.  . CTS (carpal tunnel syndrome)   . Cystitis   . Diabetes mellitus without complication ([place])   . Diverticulitis   . Diverticulosis of colon   . Dry eye syndrome   . Exocrine pancreatic insufficiency   . Frozen shoulder   . GERD (gastroesophageal reflux disease)   . HLA B27 (HLA B27 positive)   . Hx of colonic polyps   . Hyperlipidemia    NMR 2005  . Hypertension   . Hypothyroidism   . Incontinence   . Internal hemorrhoids   . Interstitial cystitis   . Kidney stone   . Menopause   . Mild aortic insufficiency   . Mild ascending aorta dilatation ([facility])   . Osteoarthritis   . Osteopenia    [facility] [place]   . S/P total hysterectomy and bilateral salpingo-oophorectomy 04/12/2014  . Sinusitis   . Sleep apnea    c-pap  . Subjective visual disturbance of both eyes 12/30/2013  . Tubular adenoma of colon   . Vitamin D deficiency   .  Xerostomia    and Xeroophthalmia    Current Outpatient Medications on File Prior to Visit  Medication Sig Dispense Refill  . acetaminophen (TYLENOL) 500 MG tablet Take 1,000 mg by mouth 3 (three) times daily as needed.     Marland Kitchen amLODipine (NORVASC) 2.5 MG tablet Take 1 tablet (2.5 mg total) by mouth daily. 90 tablet 2  . Ascorbic Acid (VITAMIN C) 1000 MG tablet Take 1,000 mg by mouth 2 (two) times daily.     Marland Kitchen aspirin 81 MG tablet Take 81 mg by mouth daily.    Marland Kitchen azelastine (ASTELIN) 0.1 % nasal spray One to two sprays each nostril twice a day as needed. 30 mL 5  . cetirizine (ZYRTEC) 10 MG tablet Take 10 mg by mouth daily.    . Cholecalciferol (VITAMIN  D-3) 25 MCG (1000 UT) CAPS Take 10,000 Units by mouth daily.     . diclofenac Sodium (VOLTAREN) 1 % GEL Apply topically 4 (four) times daily as needed.     . dicyclomine (BENTYL) 10 MG capsule Take 1 capsule (10 mg total) by mouth 3 (three) times daily as needed for spasms. 90 capsule 2  . diphenoxylate-atropine (LOMOTIL) 2.5-0.025 MG tablet Take 1 tablet by mouth 4 (four) times daily as needed for diarrhea or loose stools. 90 tablet 2  . EPINEPHrine (EPIPEN 2-PAK) 0.3 mg/0.3 mL IJ SOAJ injection Use as directed for severe allergic reactions 2 each 1  . estradiol (ESTRACE) 0.1 MG/GM vaginal cream estradiol 0.01% (0.1 mg/gram) vaginal cream    . HYDROcodone-acetaminophen (NORCO/VICODIN) 5-325 MG tablet hydrocodone 5 mg-acetaminophen 325 mg tablet  TAKE 1 TABLET BY MOUTH THREE TIMES DAILY AS NEEDED FOR 7 DAYS    . lipase/protease/amylase (CREON) 36000 UNITS CPEP capsule Take 2 capsules by mouth with each meal, 1 capsule by mouth with each snack 720 capsule 1  . Magnesium 200 MG TABS Take 1 tablet (200 mg total) by mouth daily. 30 each 6  . metoprolol tartrate (LOPRESSOR) 50 MG tablet Take 1 tablet (50 mg total) by mouth once for 1 dose. Take 2 hours prior to your Coronary CT. 1 tablet 0  . Multiple Vitamins-Minerals (CENTRUM SILVER 50+WOMEN PO) Take 1 tablet by mouth daily.    . mupirocin cream (BACTROBAN) 2 % Apply 1 application topically 2 (two) times daily. 30 g 2  . naloxone (NARCAN) 4 MG/0.1ML LIQD nasal spray kit Narcan 4 mg/actuation nasal spray  PLACE 1 SPRAY INTO EACH NOSTRIL FOR SUSPECTED OPIOID OVERDOSE. MAY REPEAT 2 - 3 MINUTES WITH NEW BOTTLE IN OTHER NOSTRIL    . Omega-3 Fatty Acids (FISH OIL BURP-LESS) 1000 MG CAPS Takes 1000 mg BID 60 capsule 0  . omeprazole (PRILOSEC) 40 MG capsule TAKE 1 CAPSULE BY MOUTH ONCE DAILY 90 capsule 2  . ondansetron (ZOFRAN) 4 MG tablet Take 1 tablet (4 mg total) by mouth every 8 (eight) hours as needed. 40 tablet 2  . rosuvastatin (CRESTOR) 5 MG tablet  Take 1 tablet by mouth 2X's a week 90 tablet 3  . sertraline (ZOLOFT) 25 MG tablet Take 1 tablet (25 mg total) by mouth daily. 90 tablet 1  . vitamin B-12 (CYANOCOBALAMIN) 1000 MCG tablet Take 1,000 mcg by mouth 2 (two) times daily.     . vitamin E 400 UNIT capsule Take 400 Units by mouth daily.    Marland Kitchen zinc gluconate 50 MG tablet Take 50 mg by mouth daily.     No current facility-administered medications on file prior to visit.  Allergies  Allergen Reactions  . Bactrim [Sulfamethoxazole-Trimethoprim] Other (See Comments)  . Nitroglycerin Anaphylaxis    Hypotensive after NTG administration in ER during chest pain evaluation Pt states she almost died,  Pt states in a procedure done in 10/2016 she had no reaction to this medication, so not sure if it is an actual allergy.   . Pentosan Polysulfate Other (See Comments)    Elevated liver count Elevated liver enzymes Elevated LFTs.......Marland Kitchen elmiron   . Pentosan Polysulfate Sodium     Elevated LFTs.......Marland Kitchen elmiron   . Promethazine Hcl Other (See Comments)    "jittery on the inside"  . Sulfamethoxazole-Trimethoprim Nausea Only    Unknown reaction per pt, Pt thinks she may have felt sick to her stomach.   . Losartan Other (See Comments)    Pt states caused insomnia, tingling sensation in legs, lightheaded, stomach pain  . Promethazine Hcl   . Lisinopril Cough    Assessment/Plan:  1. Hyperlipidemia - LDL above goal <70 mg/dL. Medication adherence appears optimal. Discussed re-trying rosuvastatin 5 mg daily as patient unsure if creepy crawling feeling is related to statin. Also, discussed alternative lipid-lowering options including PCSK9-inhibitors and ezetimibe. Discussed clinical benefits and potential side effects of each agent, and also demonstrated PCSK9-inhibitor proper injection technique with teach-back method. Patient agreed to restart rosuvastatin 5 mg daily. Counseled patient to contact lipid clinic if leg symptoms worsen. Patient  verbalized understanding. Encouraged patient to aim for a diet full of vegetables, fruit and lean meats (chicken, [place], fish) and to limit carbs (bread, pasta, sugar, rice) and red meat consumption. Encouraged patient to exercise 20-30 minutes daily with the goal of 150 minutes per week. Patient verbalized understanding. Scheduled follow-up fasting lipid panel and LFTs in 2 months.   Lorel Monaco, PharmD, [facility] [address] [place], [place] [address] Phone: [phone]; Fax: [phone]

## 2020-08-30 NOTE — Progress Notes (Addendum)
Subjective:    Patient ID: [name], female    DOB: 01-Mar-1946, 75 y.o.   MRN: [id number]  HPI The patient is here for an acute visit - follow up of cellulitis and abscess.  Has had ceftriaxone 2 gm x 1, doxycycline 100 mg BID x 10 days, ceftriaxone 1 gm x 1.     Pain better - still annoying.  The redness and firmness has decreased.  She has occasional minimal drainage. She denies any fever, chills.   It is not completely gone and she is concerned.      Medications and allergies reviewed with patient and updated if appropriate.  Patient Active Problem List   Diagnosis Date Noted   Cellulitis and abscess of buttock 08/17/2020   Right ankle effusion 05/10/2020   Elevated alkaline phosphatase level 04/20/2020   Neuralgia 04/20/2020   Swelling 12/19/2018   Spinal stenosis, 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/13/2018   Atypical chest pain 05/21/2018   Epigastric pain 05/21/2018   Baker's cyst of knee, right 05/07/2018   Perennial and seasonal allergic rhinitis 04/18/2018   Onychomycosis of toenail 03/04/2018   Hypercalcemia 09/17/2017   Left wrist pain 09/17/2017   Synovitis of left knee 05/22/2017   Diabetes ([place]) 05/02/2017   Trigger point of left shoulder region 03/27/2017   Taste disorder 02/22/2017   Anxiety and depression 02/12/2017   Caregiver with fatigue 01/17/2017   Psychosocial stressors 01/17/2017   Decreased sense of taste 01/15/2017   At high risk for falls 11/16/2016   Family history of osteoporosis 11/16/2016   Atherosclerotic plaque 11/07/2016   Chronic or recurrent subluxation of peroneal tendon of left foot 09/13/2016   Renal insufficiency 06/29/2016   Osteoporosis 06/21/2016   Dizziness 05/16/2016   Arthritis of left shoulder region 01/17/2016   Ankylosing spondylitis ([place]) 11/08/2015   Incontinence 10/28/2015   Thoracic back pain 10/19/2015   Leg cramping 09/29/2015   Primary osteoarthritis of  left shoulder 08/03/2015   Impingement syndrome of left shoulder region 09/22/2014   Interstitial cystitis 07/09/2014   Diverticulosis of colon without hemorrhage 07/08/2014   Carpal tunnel syndrome of left wrist 04/12/2014   Food allergy 03/06/2014   Renal calculi 01/30/2014   OSA (obstructive sleep apnea), moderate 01/14/2014   Allergic conjunctivitis 12/06/2013   History of Bell's palsy 02/26/2012   Vitamin D deficiency 06/27/2010   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   DRY MOUTH 06/27/2010   ABDOMINAL PAIN, SUPRAPUBIC 05/25/2009   Gastroesophageal reflux disease 04/30/2009   History of colonic polyps 04/30/2009   COSTOCHONDRITIS 01/15/2008   Hyperlipidemia 08/21/2007   Essential hypertension 08/21/2007   Arthralgia 08/21/2007   Chronic lumbar radiculopathy 01/25/2007    Current Outpatient Medications on File Prior to Visit  Medication Sig Dispense Refill   acetaminophen (TYLENOL) 500 MG tablet Take 1,000 mg by mouth 3 (three) times daily as needed.      amLODipine (NORVASC) 2.5 MG tablet Take 1 tablet (2.5 mg total) by mouth daily. 90 tablet 2   Ascorbic Acid (VITAMIN C) 1000 MG tablet Take 1,000 mg by mouth 2 (two) times daily.      aspirin 81 MG tablet Take 81 mg by mouth daily.     azelastine (ASTELIN) 0.1 % nasal spray One to two sprays each nostril twice a day as needed. 30 mL 5   cetirizine (ZYRTEC) 10 MG tablet Take 10 mg by mouth daily.     Cholecalciferol (VITAMIN D-3) 25 MCG (1000 UT) CAPS Take 10,000 Units  by mouth daily.      diclofenac Sodium (VOLTAREN) 1 % GEL Apply topically 4 (four) times daily as needed.      dicyclomine (BENTYL) 10 MG capsule Take 1 capsule (10 mg total) by mouth 3 (three) times daily as needed for spasms. 90 capsule 2   diphenoxylate-atropine (LOMOTIL) 2.5-0.025 MG tablet Take 1 tablet by mouth 4 (four) times daily as needed for diarrhea or loose stools. 90 tablet 2   EPINEPHrine (EPIPEN 2-PAK) 0.3 mg/0.3 mL IJ SOAJ  injection Use as directed for severe allergic reactions 2 each 1   estradiol (ESTRACE) 0.1 MG/GM vaginal cream estradiol 0.01% (0.1 mg/gram) vaginal cream     HYDROcodone-acetaminophen (NORCO/VICODIN) 5-325 MG tablet hydrocodone 5 mg-acetaminophen 325 mg tablet  TAKE 1 TABLET BY MOUTH THREE TIMES DAILY AS NEEDED FOR 7 DAYS     lipase/protease/amylase (CREON) 36000 UNITS CPEP capsule Take 2 capsules by mouth with each meal, 1 capsule by mouth with each snack 720 capsule 1   Magnesium 200 MG TABS Take 1 tablet (200 mg total) by mouth daily. 30 each 6   Multiple Vitamins-Minerals (CENTRUM SILVER 50+WOMEN PO) Take 1 tablet by mouth daily.     mupirocin cream (BACTROBAN) 2 % Apply 1 application topically 2 (two) times daily. 30 g 2   mupirocin ointment (BACTROBAN) 2 % Apply 1 application topically 2 (two) times daily.     naloxone (NARCAN) 4 MG/0.1ML LIQD nasal spray kit Narcan 4 mg/actuation nasal spray  PLACE 1 SPRAY INTO EACH NOSTRIL FOR SUSPECTED OPIOID OVERDOSE. MAY REPEAT 2 - 3 MINUTES WITH NEW BOTTLE IN OTHER NOSTRIL     Omega-3 Fatty Acids (FISH OIL BURP-LESS) 1000 MG CAPS Takes 1000 mg BID 60 capsule 0   omeprazole (PRILOSEC) 40 MG capsule TAKE 1 CAPSULE BY MOUTH ONCE DAILY 90 capsule 2   ondansetron (ZOFRAN) 4 MG tablet Take 1 tablet (4 mg total) by mouth every 8 (eight) hours as needed. 40 tablet 2   rosuvastatin (CRESTOR) 5 MG tablet Take 1 tablet (5 mg total) by mouth daily. Take 1 tablet by mouth 2X's a week 90 tablet 3   sertraline (ZOLOFT) 25 MG tablet Take 1 tablet (25 mg total) by mouth daily. 90 tablet 1   vitamin B-12 (CYANOCOBALAMIN) 1000 MCG tablet Take 1,000 mcg by mouth 2 (two) times daily.      vitamin E 400 UNIT capsule Take 400 Units by mouth daily.     zinc gluconate 50 MG tablet Take 50 mg by mouth daily.     metoprolol tartrate (LOPRESSOR) 50 MG tablet Take 1 tablet (50 mg total) by mouth once for 1 dose. Take 2 hours prior to your Coronary CT. 1 tablet 0    No current facility-administered medications on file prior to visit.    Past Medical History:  Diagnosis Date   Allergy    seasonal   Ankylosing spondylitis ([facility])    Anxiety    Arthritis    Bell palsy    CAD (coronary artery disease)    a. Coronary CT 10/2016 showed calcium score in the 59th perecntile, moderate CAD in RCA/PDA and narrowing of the mid to distal LAD, with negative FFR.   CTS (carpal tunnel syndrome)    Cystitis    Diabetes mellitus without complication ([facility])    Diverticulitis    Diverticulosis of colon    Dry eye syndrome    Exocrine pancreatic insufficiency    Frozen shoulder    GERD (gastroesophageal reflux disease)  HLA B27 (HLA B27 positive)    Hx of colonic polyps    Hyperlipidemia    NMR 2005   Hypertension    Hypothyroidism    Incontinence    Internal hemorrhoids    Interstitial cystitis    Kidney stone    Menopause    Mild aortic insufficiency    Mild ascending aorta dilatation ([place])    Osteoarthritis    Osteopenia    [facility] [address]   Pre-diabetes    S/P total hysterectomy and bilateral salpingo-oophorectomy 04/12/2014   Sinusitis    Sleep apnea    c-pap   Subjective visual disturbance of both eyes 12/30/2013   Tubular adenoma of colon    Vitamin D deficiency    Xerostomia    and Xeroophthalmia    Past Surgical History:  Procedure Laterality Date   BLADDER SURGERY     for incontinence   CARPAL TUNNEL RELEASE Left 12/22/2014   Procedure: LEFT CARPAL TUNNEL RELEASE;  Surgeon: Daryll Brod, MD;  Location: [facility];  Service: Orthopedics;  Laterality: Left;   CARPAL TUNNEL RELEASE Right 02/18/2019   Procedure: RIGHT CARPAL TUNNEL RELEASE;  Surgeon: Daryll Brod, MD;  Location: [facility];  Service: Orthopedics;  Laterality: Right;   COLONOSCOPY W/ POLYPECTOMY     adenomatous poyp 04-2008   colonscopy     tics 10-2002   CYSTOSCOPY WITH RETROGRADE  PYELOGRAM, URETEROSCOPY AND STENT PLACEMENT Left 09/01/2013   Procedure: CYSTOSCOPY WITH RETROGRADE PYELOGRAM, AND LEFT STENT PLACEMENT;  Surgeon: Molli Hazard, MD;  Location: [facility];  Service: Urology;  Laterality: Left;   elevated LFTs     due to Elmiron   FINGER SURGERY Left 2004   2nd finger   G 1 P 1     interstitial cystitis     with clot in catheter   PARTIAL HYSTERECTOMY     with one ovary left   SHOULDER ARTHROSCOPY Left    TOTAL ABDOMINAL HYSTERECTOMY W/ BILATERAL SALPINGOOPHORECTOMY     dysfunctional menses    Social History   Socioeconomic History   Marital status: Married    Spouse name: Not on file   Number of children: 1   Years of education: BS   Highest education level: Not on file  Occupational History   Occupation: retired   Occupation: Pharmacist, hospital    Comment: Kindergarten  Tobacco Use   Smoking status: Never Smoker   Smokeless tobacco: Never Used  Scientific laboratory technician Use: Never used  Substance and Sexual Activity   Alcohol use: Yes    Comment: social   Drug use: No   Sexual activity: Not Currently  Other Topics Concern   Not on file  Social History Narrative   Lives at home with husband.   No daily use of caffeine.   Right-handed.         Social Determinants of Health   Financial Resource Strain: Not on file  Food Insecurity: Not on file  Transportation Needs: Not on file  Physical Activity: Not on file  Stress: Not on file  Social Connections: Not on file    Family History  Problem Relation Age of Onset   Coronary artery disease Mother    Hypertension Mother    Asthma Mother    Dementia Mother    Stroke Mother        mini cva   Diabetes Father    Cirrhosis Father  non alcoholic   Colon cancer Sister 83   Allergic rhinitis Sister    Allergic rhinitis Sister    Food Allergy Sister    Renal cancer Paternal Aunt        x 2   Coronary artery disease Maternal Grandfather    Breast  cancer Maternal Aunt    Diabetes Paternal Aunt    Diabetes Paternal Grandmother    Esophageal cancer Neg Hx    Stomach cancer Neg Hx     Review of Systems  Constitutional: Negative for chills and fever.  Skin: Positive for color change and wound.  Neurological: Negative for numbness.       Objective:   Vitals:   08/31/20 1353  BP: 122/78  Pulse: (!) 101  Temp: 98.4 F (36.9 C)  SpO2: 96%   BP Readings from Last 3 Encounters:  08/31/20 122/78  08/31/20 124/84  08/26/20 140/73   Wt Readings from Last 3 Encounters:  08/31/20 154 lb (69.9 kg)  08/20/20 153 lb (69.4 kg)  08/17/20 153 lb (69.4 kg)   Body mass index is 28.17 kg/m.   Physical Exam Constitutional:      General: She is not in acute distress.    Appearance: Normal appearance. She is not ill-appearing.  HENT:     Head: Normocephalic and atraumatic.  Skin:    General: Skin is warm and dry.     Comments: Left buttock - Induration the size of a walnut- there is tenderness.  No discharge or open wound.  Area of erythema the size of a tangerine. No warmth, no fluctuance  Neurological:     Mental Status: She is alert.            Assessment & Plan:    See Problem List for Assessment and Plan of chronic medical problems.    This visit occurred during the SARS-CoV-2 public health emergency.  Safety protocols were in place, including screening questions prior to the visit, additional usage of staff PPE, and extensive cleaning of exam room while observing appropriate contact time as indicated for disinfecting solutions.

## 2020-08-31 ENCOUNTER — Encounter: Payer: Self-pay | Admitting: Family Medicine

## 2020-08-31 ENCOUNTER — Other Ambulatory Visit: Payer: Self-pay | Admitting: Internal Medicine

## 2020-08-31 ENCOUNTER — Encounter: Payer: Self-pay | Admitting: Internal Medicine

## 2020-08-31 ENCOUNTER — Ambulatory Visit: Payer: Medicare Other | Admitting: Internal Medicine

## 2020-08-31 ENCOUNTER — Ambulatory Visit (INDEPENDENT_AMBULATORY_CARE_PROVIDER_SITE_OTHER): Payer: Medicare Other | Admitting: Internal Medicine

## 2020-08-31 ENCOUNTER — Other Ambulatory Visit: Payer: Self-pay

## 2020-08-31 ENCOUNTER — Telehealth: Payer: Self-pay | Admitting: Cardiology

## 2020-08-31 ENCOUNTER — Ambulatory Visit (INDEPENDENT_AMBULATORY_CARE_PROVIDER_SITE_OTHER): Payer: Medicare Other | Admitting: Pharmacist

## 2020-08-31 ENCOUNTER — Ambulatory Visit (INDEPENDENT_AMBULATORY_CARE_PROVIDER_SITE_OTHER): Payer: Medicare Other | Admitting: Family Medicine

## 2020-08-31 VITALS — BP 124/84 | HR 73 | Ht 62.0 in

## 2020-08-31 VITALS — BP 122/78 | HR 101 | Temp 98.4°F | Ht 62.0 in | Wt 154.0 lb

## 2020-08-31 DIAGNOSIS — L0231 Cutaneous abscess of buttock: Secondary | ICD-10-CM | POA: Diagnosis not present

## 2020-08-31 DIAGNOSIS — M25571 Pain in right ankle and joints of right foot: Secondary | ICD-10-CM

## 2020-08-31 DIAGNOSIS — L03317 Cellulitis of buttock: Secondary | ICD-10-CM | POA: Diagnosis not present

## 2020-08-31 DIAGNOSIS — G8929 Other chronic pain: Secondary | ICD-10-CM

## 2020-08-31 DIAGNOSIS — I2510 Atherosclerotic heart disease of native coronary artery without angina pectoris: Secondary | ICD-10-CM

## 2020-08-31 DIAGNOSIS — M25471 Effusion, right ankle: Secondary | ICD-10-CM

## 2020-08-31 DIAGNOSIS — E782 Mixed hyperlipidemia: Secondary | ICD-10-CM

## 2020-08-31 MED ORDER — DOXYCYCLINE HYCLATE 100 MG PO TABS: 100.0000 mg | ORAL_TABLET | Freq: Two times a day (BID) | ORAL | 0 refills | Status: DC

## 2020-08-31 MED ORDER — CEFTRIAXONE SODIUM 1 G IJ SOLR
1.0000 g | Freq: Once | INTRAMUSCULAR | Status: AC
  Administered 2020-08-31: 1 g via INTRAMUSCULAR

## 2020-08-31 MED ORDER — ROSUVASTATIN CALCIUM 5 MG PO TABS: 5.0000 mg | ORAL_TABLET | Freq: Every day | ORAL | 3 refills | Status: DC

## 2020-08-31 MED FILL — DOXYCYCLINE HYCLATE 100 MG: 100 | 10 days supply | Qty: 20 | Fill #0

## 2020-08-31 NOTE — Telephone Encounter (Signed)
New message    Patient request to talk to Dr Meda Coffee only regarding test she had (monitor, CT and echo).  Please call at your convenience.

## 2020-08-31 NOTE — Progress Notes (Signed)
[place] [facility] [place] Phone: [phone] Subjective:   [name], am serving as a scribe for Dr. Hulan Saas. This visit occurred during the SARS-CoV-2 public health emergency.  Safety protocols were in place, including screening questions prior to the visit, additional usage of staff PPE, and extensive cleaning of exam room while observing appropriate contact time as indicated for disinfecting solutions.   I'm seeing this patient by the request  of:  Binnie Rail, MD  CC: Right ankle pain follow-up  [id number]   06/14/2020 Patient's ankle effusion seems to be nearly completely gone at this time.  Patient has improvement in range of motion.  He does have arthritic changes in the questionable OCD.  We discussed with patient about the potential for advanced imaging but patient would not want surgical intervention.  Patient is able to wear a regular shoe with [name] significant pain.  Patient will continue to stay active otherwise.  Follow-up with me again 2 to 3 months  Update 08/31/2020 [name] is a 75 y.o. female coming in with complaint of right ankle swelling. Patient still has to sleep with boot near her bed and a crutch not far away. When going down the stairs she is unable to lead with that foot. Does notice that ankle feels unstable.  Patient states that over the last month or so it seems to be worsening again.  Has not noticed any swelling but does having increasing instability of the ankle.  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  Patient denies any numbness, states that sometimes feels like having some increasing discomfort and pain.     Past Medical History:  Diagnosis Date  . Allergy    seasonal  . Ankylosing spondylitis ([place])   . Anxiety   . Arthritis   . Bell palsy   . CAD (coronary artery disease)    a. Coronary CT 10/2016 showed calcium score in the  59th perecntile, moderate CAD in RCA/PDA and narrowing of the mid to distal LAD, with negative FFR.  . CTS (carpal tunnel syndrome)   . Cystitis   . Diabetes mellitus without complication ([place])   . Diverticulitis   . Diverticulosis of colon   . Dry eye syndrome   . Exocrine pancreatic insufficiency   . Frozen shoulder   . GERD (gastroesophageal reflux disease)   . HLA B27 (HLA B27 positive)   . Hx of colonic polyps   . Hyperlipidemia    NMR 2005  . Hypertension   . Hypothyroidism   . Incontinence   . Internal hemorrhoids   . Interstitial cystitis   . Kidney stone   . Menopause   . Mild aortic insufficiency   . Mild ascending aorta dilatation ([facility])   . Osteoarthritis   . Osteopenia    [facility] [place]   . S/P total hysterectomy and bilateral salpingo-oophorectomy 04/12/2014  . Sinusitis   . Sleep apnea    c-pap  . Subjective visual disturbance of both eyes 12/30/2013  . Tubular adenoma of colon   . Vitamin D deficiency   . Xerostomia    and Xeroophthalmia   Past Surgical History:  Procedure Laterality Date  . BLADDER SURGERY     for incontinence  . CARPAL TUNNEL RELEASE Left 12/22/2014   Procedure: LEFT CARPAL TUNNEL RELEASE;  Surgeon: Daryll Brod, MD;  Location: [facility];  Service: Orthopedics;  Laterality: Left;  . CARPAL TUNNEL RELEASE Right 02/18/2019   Procedure: RIGHT CARPAL TUNNEL RELEASE;  Surgeon: Daryll Brod, MD;  Location: [facility];  Service: Orthopedics;  Laterality: Right;  . COLONOSCOPY W/ POLYPECTOMY     adenomatous poyp 04-2008  . colonscopy     tics 10-2002  . CYSTOSCOPY WITH RETROGRADE PYELOGRAM, URETEROSCOPY AND STENT PLACEMENT Left 09/01/2013   Procedure: CYSTOSCOPY WITH RETROGRADE PYELOGRAM, AND LEFT STENT PLACEMENT;  Surgeon: Molli Hazard, MD;  Location: [facility];  Service: Urology;  Laterality: Left;  . elevated LFTs     due to Elmiron  . FINGER SURGERY Left 2004   2nd finger   . G 1 P 1    . interstitial cystitis     with clot in catheter  . PARTIAL HYSTERECTOMY     with one ovary left  . SHOULDER ARTHROSCOPY Left   . TOTAL ABDOMINAL HYSTERECTOMY W/ BILATERAL SALPINGOOPHORECTOMY     dysfunctional menses   Social History   Socioeconomic History  . Marital status: Married    Spouse name: Not on file  . Number of children: 1  . Years of education: BS  . Highest education level: Not on file  Occupational History  . Occupation: retired  . Occupation: Pharmacist, hospital    Comment: Kindergarten  Tobacco Use  . Smoking status: Never Smoker  . Smokeless tobacco: Never Used  Vaping Use  . Vaping Use: Never used  Substance and Sexual Activity  . Alcohol use: Yes    Comment: social  . Drug use: [name]  . Sexual activity: Not Currently  Other Topics Concern  . Not on file  Social History Narrative   Lives at home with husband.   [name] daily use of caffeine.   Right-handed.         Social Determinants of Health   Financial Resource Strain: Not on file  Food Insecurity: Not on file  Transportation Needs: Not on file  Physical Activity: Not on file  Stress: Not on file  Social Connections: Not on file   Allergies  Allergen Reactions  . Bactrim [Sulfamethoxazole-Trimethoprim] Other (See Comments)  . Nitroglycerin Anaphylaxis    Hypotensive after NTG administration in ER during chest pain evaluation Pt states she almost died,  Pt states in a procedure done in 10/2016 she had [name] reaction to this medication, so not sure if it is an actual allergy.   . Pentosan Polysulfate Other (See Comments)    Elevated liver count Elevated liver enzymes Elevated LFTs.......Marland Kitchen elmiron   . Pentosan Polysulfate Sodium     Elevated LFTs.......Marland Kitchen elmiron   . Promethazine Hcl Other (See Comments)    "jittery on the inside"  . Sulfamethoxazole-Trimethoprim Nausea Only    Unknown reaction per pt, Pt thinks she may have felt sick to her stomach.   . Losartan Other (See Comments)     Pt states caused insomnia, tingling sensation in legs, lightheaded, stomach pain  . Promethazine Hcl   . Lisinopril Cough   Family History  Problem Relation Age of Onset  . Coronary artery disease Mother   . Hypertension Mother   . Asthma Mother   . Dementia Mother   . Stroke Mother        mini cva  . Diabetes Father   . Cirrhosis Father        non alcoholic  . Colon cancer Sister 36  . Allergic rhinitis Sister   .  Allergic rhinitis Sister   . Food Allergy Sister   . Renal cancer Paternal Aunt        x 2  . Coronary artery disease Maternal Grandfather   . Breast cancer Maternal Aunt   . Diabetes Paternal Aunt   . Diabetes Paternal Grandmother   . Esophageal cancer Neg Hx   . Stomach cancer Neg Hx      Current Outpatient Medications (Cardiovascular):  .  amLODipine (NORVASC) 2.5 MG tablet, Take 1 tablet (2.5 mg total) by mouth daily. Marland Kitchen  EPINEPHrine (EPIPEN 2-PAK) 0.3 mg/0.3 mL IJ SOAJ injection, Use as directed for severe allergic reactions .  metoprolol tartrate (LOPRESSOR) 50 MG tablet, Take 1 tablet (50 mg total) by mouth once for 1 dose. Take 2 hours prior to your Coronary CT. .  rosuvastatin (CRESTOR) 5 MG tablet, Take 1 tablet (5 mg total) by mouth daily. Take 1 tablet by mouth 2X's a week  Current Outpatient Medications (Respiratory):  .  azelastine (ASTELIN) 0.1 % nasal spray, One to two sprays each nostril twice a day as needed. .  cetirizine (ZYRTEC) 10 MG tablet, Take 10 mg by mouth daily.  Current Outpatient Medications (Analgesics):  .  acetaminophen (TYLENOL) 500 MG tablet, Take 1,000 mg by mouth 3 (three) times daily as needed.  Marland Kitchen  aspirin 81 MG tablet, Take 81 mg by mouth daily. Marland Kitchen  HYDROcodone-acetaminophen (NORCO/VICODIN) 5-325 MG tablet, hydrocodone 5 mg-acetaminophen 325 mg tablet  TAKE 1 TABLET BY MOUTH THREE TIMES DAILY AS NEEDED FOR 7 DAYS  Current Outpatient Medications (Hematological):  .  vitamin B-12 (CYANOCOBALAMIN) 1000 MCG tablet, Take 1,000  mcg by mouth 2 (two) times daily.   Current Outpatient Medications (Other):  Marland Kitchen  Ascorbic Acid (VITAMIN C) 1000 MG tablet, Take 1,000 mg by mouth 2 (two) times daily.  .  Cholecalciferol (VITAMIN D-3) 25 MCG (1000 UT) CAPS, Take 10,000 Units by mouth daily.  .  diclofenac Sodium (VOLTAREN) 1 % GEL, Apply topically 4 (four) times daily as needed.  .  dicyclomine (BENTYL) 10 MG capsule, Take 1 capsule (10 mg total) by mouth 3 (three) times daily as needed for spasms. .  diphenoxylate-atropine (LOMOTIL) 2.5-0.025 MG tablet, Take 1 tablet by mouth 4 (four) times daily as needed for diarrhea or loose stools. Marland Kitchen  doxycycline (VIBRA-TABS) 100 MG tablet, Take 1 tablet (100 mg total) by mouth 2 (two) times daily. Marland Kitchen  estradiol (ESTRACE) 0.1 MG/GM vaginal cream, estradiol 0.01% (0.1 mg/gram) vaginal cream .  lipase/protease/amylase (CREON) 36000 UNITS CPEP capsule, Take 2 capsules by mouth with each meal, 1 capsule by mouth with each snack .  Magnesium 200 MG TABS, Take 1 tablet (200 mg total) by mouth daily. .  Multiple Vitamins-Minerals (CENTRUM SILVER 50+WOMEN PO), Take 1 tablet by mouth daily. .  mupirocin cream (BACTROBAN) 2 %, Apply 1 application topically 2 (two) times daily. .  mupirocin ointment (BACTROBAN) 2 %, Apply 1 application topically 2 (two) times daily. .  naloxone (NARCAN) 4 MG/0.1ML LIQD nasal spray kit, Narcan 4 mg/actuation nasal spray  PLACE 1 SPRAY INTO EACH NOSTRIL FOR SUSPECTED OPIOID OVERDOSE. MAY REPEAT 2 - 3 MINUTES WITH NEW BOTTLE IN OTHER NOSTRIL .  Omega-3 Fatty Acids (FISH OIL BURP-LESS) 1000 MG CAPS, Takes 1000 mg BID .  omeprazole (PRILOSEC) 40 MG capsule, TAKE 1 CAPSULE BY MOUTH ONCE DAILY .  ondansetron (ZOFRAN) 4 MG tablet, Take 1 tablet (4 mg total) by mouth every 8 (eight) hours as needed. .  sertraline (ZOLOFT)  25 MG tablet, Take 1 tablet (25 mg total) by mouth daily. .  vitamin E 400 UNIT capsule, Take 400 Units by mouth daily. Marland Kitchen  zinc gluconate 50 MG tablet, Take 50  mg by mouth daily.   Reviewed prior external information including notes and imaging from  primary care provider As well as notes that were available from care everywhere and other healthcare systems.  Past medical history, social, surgical and family history all reviewed in electronic medical record.  [name] pertanent information unless stated regarding to the chief complaint.   Review of Systems:  [name] headache, visual changes, nausea, vomiting, diarrhea, constipation, dizziness, abdominal pain, skin rash, fevers, chills, night sweats, weight loss, swollen lymph nodes, body aches, joint swelling, chest pain, shortness of breath, mood changes. POSITIVE muscle aches  Objective  Blood pressure 124/84, pulse 73, height 5' 2"  (1.575 m), SpO2 98 %.   General: [name] apparent distress alert and oriented x3 mood and affect normal, dressed appropriately.  HEENT: Pupils equal, extraocular movements intact  Respiratory: Patient's speak in full sentences and does not appear short of breath  Cardiovascular: [name] lower extremity edema, non tender, [name] erythema  Gait normal with good balance and coordination.  MSK: Arthritic changes of multiple joints. Right ankle exam shows the patient does have some arthritic changes.  Limited dorsiflexion of only 10 degrees and plantar flexion of 15 degrees.  Mild pain over the anterior lateral ankle mortise.  Very mild instability noted secondary to the ankle arthritis.  Achilles intact.  Neurovascularly intact.   Impression and Recommendations:     The above documentation has been reviewed and is accurate and complete Lyndal Pulley, DO

## 2020-08-31 NOTE — Patient Instructions (Addendum)
Nice to see you today!  Keep up the good work with diet and exercise. Aim for a diet full of vegetables, fruit and lean meats (chicken, [place], fish). Try to limit carbs (bread, pasta, sugar, rice) and red meat consumption.  Your goal LDL is <70 mg/dL, you're currently at 77 mg/dL  Medication Changes: Begin taking Rosuvastatin 5 mg daily   Please give [place] a call at [phone] with any questions or concerns.  Other additional cholesterol lowering medications -Cholesterol Injections: Praluent or Repatha which is given every 2 weeks (~60% lowering in LDL) -Ezetimibe (Zetia) 10 mg daily (~20% lowering)

## 2020-08-31 NOTE — Assessment & Plan Note (Addendum)
Subacute Improved, but still not resolved ceftriaxone 1 gm IM x 1 Doxycycline 100 mg BID x 10 days Continue warm compresses Update with progress after completing antibiotics

## 2020-08-31 NOTE — Patient Instructions (Signed)
MRI [facility] °[phone] °

## 2020-08-31 NOTE — Assessment & Plan Note (Signed)
Patient does have the ankle effusion and does have instability.  Patient's x-rays are concerning for potential OCD and I do feel advanced imaging is warranted at this time with patient failing formal physical therapy, home exercises, bracing.  Patient will have the MRI and then we will discuss further treatment options.

## 2020-08-31 NOTE — Patient Instructions (Addendum)
You had an antibiotic injection today.     Take the doxycyline until complete.      Update me with your symptoms after completing the antibiotic

## 2020-08-31 NOTE — Telephone Encounter (Signed)
Left voice mail to call back 

## 2020-09-01 NOTE — Telephone Encounter (Signed)
Left the pt a message to call the office back. 

## 2020-09-02 ENCOUNTER — Telehealth: Payer: Self-pay | Admitting: Cardiology

## 2020-09-02 NOTE — Telephone Encounter (Addendum)
Received: 1 week ago [name] Spark, MD  Nuala Alpha, LPN These are benign arrhythmias, very short, if her symptoms are bothering her, we can try flecainide 50 mg PO BID followed by a GXT in 5-10 days.    Pt was calling to go over her monitor results and recommendations as mentioned above per Dr. Meda Coffee.  Pt states she is asymptomatic at this time, and will hold off on starting flecainide and a GXT 5 -10 days thereafter.  She states she will call as needed, if she develops symptoms, and wants to proceed with recommended medication and testing.  Pt verbalized understanding and agrees with this plan. Pt appreciative for all the assistance provided.

## 2020-09-02 NOTE — Telephone Encounter (Signed)
Patient is returning [name]'s call. Please advise.

## 2020-09-02 NOTE — Telephone Encounter (Signed)
Message Received: 1 week ago Dorothy Spark, MD  Nuala Alpha, LPN These are benign arrhythmias, very short, if her symptoms are bothering her, we can try flecainide 50 mg PO BID followed by a GXT in 5-10 days.    Pt was calling to go over her monitor results and recommendations as mentioned above per Dr. Meda Coffee.  Pt states she is asymptomatic at this time, and will hold off on starting flecainide and a GXT 5 -10 days thereafter.  She states she will call as needed, if she develops symptoms, and wants to proceed with recommended medication and testing.  Pt verbalized understanding and agrees with this plan. Pt appreciative for all the assistance provided.

## 2020-09-08 ENCOUNTER — Other Ambulatory Visit: Payer: Medicare Other

## 2020-09-08 DIAGNOSIS — G4733 Obstructive sleep apnea (adult) (pediatric): Secondary | ICD-10-CM | POA: Diagnosis not present

## 2020-09-13 ENCOUNTER — Ambulatory Visit
Admission: RE | Admit: 2020-09-13 | Discharge: 2020-09-13 | Disposition: A | Discharge status: Home / Self Care | Payer: Medicare Other | Source: Ambulatory Visit | Attending: Family Medicine | Admitting: Family Medicine

## 2020-09-13 DIAGNOSIS — R600 Localized edema: Secondary | ICD-10-CM | POA: Diagnosis not present

## 2020-09-13 DIAGNOSIS — M25471 Effusion, right ankle: Secondary | ICD-10-CM | POA: Diagnosis not present

## 2020-09-13 DIAGNOSIS — G8929 Other chronic pain: Secondary | ICD-10-CM

## 2020-09-13 DIAGNOSIS — M25571 Pain in right ankle and joints of right foot: Secondary | ICD-10-CM | POA: Diagnosis not present

## 2020-09-18 ENCOUNTER — Encounter: Payer: Self-pay | Admitting: Family Medicine

## 2020-09-23 ENCOUNTER — Ambulatory Visit: Payer: Medicare Other | Admitting: Allergy and Immunology

## 2020-09-24 ENCOUNTER — Other Ambulatory Visit: Payer: Self-pay | Admitting: Internal Medicine

## 2020-09-28 ENCOUNTER — Telehealth: Payer: Self-pay | Admitting: Internal Medicine

## 2020-09-28 ENCOUNTER — Other Ambulatory Visit: Payer: Self-pay | Admitting: Internal Medicine

## 2020-09-28 MED ORDER — DIPHENOXYLATE-ATROPINE 2.5-0.025 MG PO TABS: ORAL_TABLET | ORAL | 2 refills | Status: DC

## 2020-09-28 MED FILL — DIPHENOXYLATE-ATROPINE 2.5-: 2.5-0.025 | 23 days supply | Qty: 90 | Fill #0

## 2020-09-28 NOTE — Telephone Encounter (Signed)
Lomotil has been re-sent to [facility].

## 2020-09-28 NOTE — Telephone Encounter (Signed)
[name] from [facility] states they have not received the prescription for the Lomotil.

## 2020-10-06 MED FILL — SERTRALINE HCL 25 MG TABLET: 25 | 90 days supply | Qty: 90 | Fill #1

## 2020-10-07 ENCOUNTER — Other Ambulatory Visit (HOSPITAL_BASED_OUTPATIENT_CLINIC_OR_DEPARTMENT_OTHER): Payer: Self-pay | Admitting: Family Medicine

## 2020-10-07 DIAGNOSIS — M1711 Unilateral primary osteoarthritis, right knee: Secondary | ICD-10-CM | POA: Diagnosis not present

## 2020-10-07 DIAGNOSIS — M25561 Pain in right knee: Secondary | ICD-10-CM | POA: Diagnosis not present

## 2020-10-07 MED FILL — predniSONE 10 MG TABS: 10 | 6 days supply | Qty: 21 | Fill #0

## 2020-10-12 DIAGNOSIS — M79673 Pain in unspecified foot: Secondary | ICD-10-CM | POA: Diagnosis not present

## 2020-10-12 DIAGNOSIS — M179 Osteoarthritis of knee, unspecified: Secondary | ICD-10-CM | POA: Diagnosis not present

## 2020-10-12 DIAGNOSIS — M79641 Pain in right hand: Secondary | ICD-10-CM | POA: Diagnosis not present

## 2020-10-12 DIAGNOSIS — M159 Polyosteoarthritis, unspecified: Secondary | ICD-10-CM | POA: Diagnosis not present

## 2020-10-12 DIAGNOSIS — M79642 Pain in left hand: Secondary | ICD-10-CM | POA: Diagnosis not present

## 2020-10-12 DIAGNOSIS — M48061 Spinal stenosis, lumbar region without neurogenic claudication: Secondary | ICD-10-CM | POA: Diagnosis not present

## 2020-10-12 DIAGNOSIS — M25552 Pain in left hip: Secondary | ICD-10-CM | POA: Diagnosis not present

## 2020-10-12 DIAGNOSIS — M25551 Pain in right hip: Secondary | ICD-10-CM | POA: Diagnosis not present

## 2020-10-12 DIAGNOSIS — M461 Sacroiliitis, not elsewhere classified: Secondary | ICD-10-CM | POA: Diagnosis not present

## 2020-10-12 DIAGNOSIS — M25512 Pain in left shoulder: Secondary | ICD-10-CM | POA: Diagnosis not present

## 2020-10-12 DIAGNOSIS — M810 Age-related osteoporosis without current pathological fracture: Secondary | ICD-10-CM | POA: Diagnosis not present

## 2020-10-12 DIAGNOSIS — G894 Chronic pain syndrome: Secondary | ICD-10-CM | POA: Diagnosis not present

## 2020-10-21 ENCOUNTER — Telehealth: Payer: Self-pay | Admitting: Cardiology

## 2020-10-21 NOTE — Telephone Encounter (Signed)
Wife just wanted to call and say they have requested to see Melina Copa PA-C to care for them being Dr. Meda Coffee is leaving.   Pt states both her and her Husband have follow-up appts arranged with [facility] in the near future.  Informed the pts wife this is noted.  Wife thanked me for all the assistance provided.

## 2020-10-21 NOTE — Telephone Encounter (Signed)
Patient states she has a quick question for Ivy regarding Dr. Meda Coffee leaving. Please advise.

## 2020-10-25 ENCOUNTER — Other Ambulatory Visit: Payer: Self-pay

## 2020-10-25 ENCOUNTER — Other Ambulatory Visit: Payer: Medicare Other | Admitting: *Deleted

## 2020-10-25 DIAGNOSIS — I77810 Thoracic aortic ectasia: Secondary | ICD-10-CM

## 2020-10-25 DIAGNOSIS — I10 Essential (primary) hypertension: Secondary | ICD-10-CM

## 2020-10-25 DIAGNOSIS — I2510 Atherosclerotic heart disease of native coronary artery without angina pectoris: Secondary | ICD-10-CM | POA: Diagnosis not present

## 2020-10-25 DIAGNOSIS — R600 Localized edema: Secondary | ICD-10-CM

## 2020-10-25 DIAGNOSIS — E785 Hyperlipidemia, unspecified: Secondary | ICD-10-CM

## 2020-10-25 DIAGNOSIS — E611 Iron deficiency: Secondary | ICD-10-CM

## 2020-10-25 DIAGNOSIS — I351 Nonrheumatic aortic (valve) insufficiency: Secondary | ICD-10-CM

## 2020-10-25 DIAGNOSIS — G2581 Restless legs syndrome: Secondary | ICD-10-CM | POA: Diagnosis not present

## 2020-10-25 LAB — LIPID PANEL
Chol/HDL Ratio: 2.3 ratio (ref 0.0–4.4)
Cholesterol, Total: 148 mg/dL (ref 100–199)
HDL: 63 mg/dL (ref >39)
LDL Chol Calc (NIH): 59 mg/dL (ref 0–99)
Triglycerides: 153 mg/dL — ABNORMAL HIGH (ref 0–149)
VLDL Cholesterol Cal: 26 mg/dL (ref 5–40)

## 2020-10-25 LAB — BASIC METABOLIC PANEL
BUN/Creatinine Ratio: 14 (ref 12–28)
BUN: 13 mg/dL (ref 8–27)
CO2: 22 mmol/L (ref 20–29)
Calcium: 10 mg/dL (ref 8.7–10.3)
Chloride: 105 mmol/L (ref 96–106)
Creatinine, Ser: 0.94 mg/dL (ref 0.57–1.00)
Glucose: 110 mg/dL — ABNORMAL HIGH (ref 65–99)
Potassium: 4.2 mmol/L (ref 3.5–5.2)
Sodium: 143 mmol/L (ref 134–144)
eGFR: 63 mL/min/{1.73_m2} (ref >59)

## 2020-10-26 ENCOUNTER — Telehealth: Payer: Self-pay | Admitting: Pharmacist

## 2020-10-26 ENCOUNTER — Telehealth: Payer: Self-pay | Admitting: *Deleted

## 2020-10-26 DIAGNOSIS — I2510 Atherosclerotic heart disease of native coronary artery without angina pectoris: Secondary | ICD-10-CM

## 2020-10-26 MED ORDER — ROSUVASTATIN CALCIUM 20 MG PO TABS: 20.0000 mg | ORAL_TABLET | Freq: Every day | ORAL | 1 refills | Status: DC

## 2020-10-26 MED ORDER — ROSUVASTATIN CALCIUM 5 MG PO TABS: 5.0000 mg | ORAL_TABLET | Freq: Every day | ORAL | 3 refills | Status: DC

## 2020-10-26 NOTE — Telephone Encounter (Signed)
Pt aware of lab results and recommendations per Dr. Meda Coffee, for her to increase her rosuvastatin to 20 mg po daily.  Confirmed the pharmacy of choice with the pt.  Pt states she will use her current supply on hand before filling.  Note placed to the pharmacy about this.  Pt verbalized understanding and agrees with this plan.

## 2020-10-26 NOTE — Telephone Encounter (Signed)
Contacted patient regarding lipid panel results. Informed patient LDL 59 at goal <70 mg/dL and TG mildly elevated at 153 - discussed improving diet including minimizing carbs, sugar and alcohol intake. Patient reports medication adherence and no side effects with rosuvastatin 5 mg daily. Encouraged patient to continue current regimen and to call with any concerns/questions. Patient verbalized understanding.

## 2020-10-26 NOTE — Telephone Encounter (Signed)
-----   Message from Dorothy Spark, MD sent at 10/26/2020  4:41 PM EDT ----- Her triglycerides are still elevated, given the fact that her calcium score progressed in the last 4 years I would increase her rosuvastatin to 20 mg daily.

## 2020-10-30 ENCOUNTER — Other Ambulatory Visit (HOSPITAL_BASED_OUTPATIENT_CLINIC_OR_DEPARTMENT_OTHER): Payer: Self-pay

## 2020-11-02 ENCOUNTER — Ambulatory Visit: Payer: Medicare Other | Admitting: Physician Assistant

## 2020-11-10 ENCOUNTER — Other Ambulatory Visit (HOSPITAL_BASED_OUTPATIENT_CLINIC_OR_DEPARTMENT_OTHER): Payer: Self-pay

## 2020-11-10 MED FILL — Omeprazole Cap Delayed Release 40 MG: ORAL | 90 days supply | Qty: 90 | Fill #0 | Status: AC

## 2020-11-11 ENCOUNTER — Ambulatory Visit: Payer: Medicare Other | Admitting: Cardiology

## 2020-11-23 NOTE — Progress Notes (Signed)
[facility] [address] [place] Phone: [phone] Subjective:   I [name] am serving as a Education administrator for Dr. Hulan Saas.  This visit occurred during the SARS-CoV-2 public health emergency.  Safety protocols were in place, including screening questions prior to the visit, additional usage of staff PPE, and extensive cleaning of exam room while observing appropriate contact time as indicated for disinfecting solutions.   I'm seeing this patient by the request  of:  Binnie Rail, MD  CC: Right ankle pain, right knee pain  [id number]   08/31/2020 Patient does have the ankle effusion and does have instability.  Patient's x-rays are concerning for potential OCD and I do feel advanced imaging is warranted at this time with patient failing formal physical therapy, home exercises, bracing.  Patient will have the MRI and then we will discuss further treatment options.   Update 11/24/2020 [name] is a 75 y.o. female coming in with complaint of right knee pain. Patient states most of her joints hurt. Posterior and medial pain. States she has made some progress since the ankle issue. Still feels some twinges in the ankle. Feels more in her ankle when she is going down stairs and states it feels as if it will give way.  States that it is more in the right knee at the moment.  Describes it as a dull, throbbing aching pain.  States that affect daily activities sometimes.  MRI R ankle 09/13/2020 IMPRESSION: 1. Findings suggestive of an acute to subacute nondisplaced fracture of the lateral malleolus. 2. 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 3. Focal marrow edema within the lateral process of the talus, which may reflect contusion versus stress related changes. 4. Loss of the normal fat signal within the sinus tarsi, which can be seen in the setting of sinus tarsi syndrome.     Past Medical History:   Diagnosis Date  . Allergy    seasonal  . Ankylosing spondylitis ([place])   . Anxiety   . Arthritis   . Bell palsy   . CAD (coronary artery disease)    a. Coronary CT 10/2016 showed calcium score in the 59th perecntile, moderate CAD in RCA/PDA and narrowing of the mid to distal LAD, with negative FFR.  . CTS (carpal tunnel syndrome)   . Cystitis   . Diabetes mellitus without complication ([place])   . Diverticulitis   . Diverticulosis of colon   . Dry eye syndrome   . Exocrine pancreatic insufficiency   . Frozen shoulder   . GERD (gastroesophageal reflux disease)   . HLA B27 (HLA B27 positive)   . Hx of colonic polyps   . Hyperlipidemia    NMR 2005  . Hypertension   . Hypothyroidism   . Incontinence   . Internal hemorrhoids   . Interstitial cystitis   . Kidney stone   . Menopause   . Mild aortic insufficiency   . Mild ascending aorta dilatation ([facility])   . Osteoarthritis   . Osteopenia    [facility] [place]   . S/P total hysterectomy and bilateral salpingo-oophorectomy 04/12/2014  . Sinusitis   . Sleep apnea    c-pap  . Subjective visual disturbance of both eyes 12/30/2013  . Tubular adenoma of colon   . Vitamin D deficiency   . Xerostomia    and Xeroophthalmia   Past Surgical History:  Procedure Laterality Date  .  BLADDER SURGERY     for incontinence  . CARPAL TUNNEL RELEASE Left 12/22/2014   Procedure: LEFT CARPAL TUNNEL RELEASE;  Surgeon: Daryll Brod, MD;  Location: [facility];  Service: Orthopedics;  Laterality: Left;  . CARPAL TUNNEL RELEASE Right 02/18/2019   Procedure: RIGHT CARPAL TUNNEL RELEASE;  Surgeon: Daryll Brod, MD;  Location: [facility];  Service: Orthopedics;  Laterality: Right;  . COLONOSCOPY W/ POLYPECTOMY     adenomatous poyp 04-2008  . colonscopy     tics 10-2002  . CYSTOSCOPY WITH RETROGRADE PYELOGRAM, URETEROSCOPY AND STENT PLACEMENT Left 09/01/2013   Procedure: CYSTOSCOPY WITH RETROGRADE  PYELOGRAM, AND LEFT STENT PLACEMENT;  Surgeon: Molli Hazard, MD;  Location: [facility];  Service: Urology;  Laterality: Left;  . elevated LFTs     due to Elmiron  . FINGER SURGERY Left 2004   2nd finger  . G 1 P 1    . interstitial cystitis     with clot in catheter  . PARTIAL HYSTERECTOMY     with one ovary left  . SHOULDER ARTHROSCOPY Left   . TOTAL ABDOMINAL HYSTERECTOMY W/ BILATERAL SALPINGOOPHORECTOMY     dysfunctional menses   Social History   Socioeconomic History  . Marital status: Married    Spouse name: Not on file  . Number of children: 1  . Years of education: BS  . Highest education level: Not on file  Occupational History  . Occupation: retired  . Occupation: Pharmacist, hospital    Comment: Kindergarten  Tobacco Use  . Smoking status: Never Smoker  . Smokeless tobacco: Never Used  Vaping Use  . Vaping Use: Never used  Substance and Sexual Activity  . Alcohol use: Yes    Comment: social  . Drug use: No  . Sexual activity: Not Currently  Other Topics Concern  . Not on file  Social History Narrative   Lives at home with husband.   No daily use of caffeine.   Right-handed.         Social Determinants of Health   Financial Resource Strain: Not on file  Food Insecurity: Not on file  Transportation Needs: Not on file  Physical Activity: Not on file  Stress: Not on file  Social Connections: Not on file   Allergies  Allergen Reactions  . Bactrim [Sulfamethoxazole-Trimethoprim] Other (See Comments)  . Nitroglycerin Anaphylaxis    Hypotensive after NTG administration in ER during chest pain evaluation Pt states she almost died,  Pt states in a procedure done in 10/2016 she had no reaction to this medication, so not sure if it is an actual allergy.   . Pentosan Polysulfate Other (See Comments)    Elevated liver count Elevated liver enzymes Elevated LFTs.......Marland Kitchen elmiron   . Pentosan Polysulfate Sodium     Elevated LFTs.......Marland Kitchen elmiron   . Promethazine Hcl  Other (See Comments)    "jittery on the inside"  . Sulfamethoxazole-Trimethoprim Nausea Only    Unknown reaction per pt, Pt thinks she may have felt sick to her stomach.   . Losartan Other (See Comments)    Pt states caused insomnia, tingling sensation in legs, lightheaded, stomach pain  . Promethazine Hcl   . Lisinopril Cough   Family History  Problem Relation Age of Onset  . Coronary artery disease Mother   . Hypertension Mother   . Asthma Mother   . Dementia Mother   . Stroke Mother        mini cva  . Diabetes Father   .  Cirrhosis Father        non alcoholic  . Colon cancer Sister 76  . Allergic rhinitis Sister   . Allergic rhinitis Sister   . Food Allergy Sister   . Renal cancer Paternal Aunt        x 2  . Coronary artery disease Maternal Grandfather   . Breast cancer Maternal Aunt   . Diabetes Paternal Aunt   . Diabetes Paternal Grandmother   . Esophageal cancer Neg Hx   . Stomach cancer Neg Hx     Current Outpatient Medications (Endocrine & Metabolic):  .  predniSONE (DELTASONE) 10 MG tablet, TAKE 6 TABS BY MOUTH ON DAY 1; 5 TABS ON DAY 2; 4 TABS ON DAY 3; 3 TABS ON DAY 4; 2 TABS ON DAY 5; 1 TAB ON DAY 6 THEN STOP .  predniSONE (DELTASONE) 5 MG tablet, TAKE 1 TABLET BY MOUTH ONCE DAILY  Current Outpatient Medications (Cardiovascular):  .  amLODipine (NORVASC) 2.5 MG tablet, Take 1 tablet (2.5 mg total) by mouth daily. Marland Kitchen  amLODipine (NORVASC) 2.5 MG tablet, TAKE 1 TABLET (2.5 MG TOTAL) BY MOUTH DAILY. Marland Kitchen  EPINEPHrine (EPIPEN 2-PAK) 0.3 mg/0.3 mL IJ SOAJ injection, Use as directed for severe allergic reactions .  metoprolol tartrate (LOPRESSOR) 50 MG tablet, TAKE 1 TABLET BY MOUTH ONCE AS A SINGLE DOSE. TAKE 2 HOURS PRIOR TO CORONARY CT .  rosuvastatin (CRESTOR) 20 MG tablet, Take 1 tablet (20 mg total) by mouth daily.  Current Outpatient Medications (Respiratory):  .  azelastine (ASTELIN) 0.1 % nasal spray, One to two sprays each nostril twice a day as needed. .   cetirizine (ZYRTEC) 10 MG tablet, Take 10 mg by mouth daily.  Current Outpatient Medications (Analgesics):  .  acetaminophen (TYLENOL) 500 MG tablet, Take 1,000 mg by mouth 3 (three) times daily as needed.  Marland Kitchen  aspirin 81 MG tablet, Take 81 mg by mouth daily. Marland Kitchen  HYDROcodone-acetaminophen (NORCO/VICODIN) 5-325 MG tablet, hydrocodone 5 mg-acetaminophen 325 mg tablet  TAKE 1 TABLET BY MOUTH THREE TIMES DAILY AS NEEDED FOR 7 DAYS  Current Outpatient Medications (Hematological):  .  vitamin B-12 (CYANOCOBALAMIN) 1000 MCG tablet, Take 1,000 mcg by mouth 2 (two) times daily.   Current Outpatient Medications (Other):  Marland Kitchen  Ascorbic Acid (VITAMIN C) 1000 MG tablet, Take 1,000 mg by mouth 2 (two) times daily.  .  Cholecalciferol (VITAMIN D-3) 25 MCG (1000 UT) CAPS, Take 10,000 Units by mouth daily.  Marland Kitchen  COVID-19 mRNA vaccine, Pfizer, 30 MCG/0.3ML injection, INJECT AS DIRECTED .  diclofenac Sodium (VOLTAREN) 1 % GEL, Apply topically 4 (four) times daily as needed.  .  dicyclomine (BENTYL) 10 MG capsule, Take 1 capsule (10 mg total) by mouth 3 (three) times daily as needed for spasms. .  diphenoxylate-atropine (LOMOTIL) 2.5-0.025 MG tablet, TAKE 1 TABLET BY MOUTH 4 TIMES DAILY AS NEEDED FOR DIARRHEA OR LOOSE STOOLS .  doxycycline (VIBRA-TABS) 100 MG tablet, TAKE 1 TABLET BY MOUTH TWICE DAILY .  estradiol (ESTRACE) 0.1 MG/GM vaginal cream, estradiol 0.01% (0.1 mg/gram) vaginal cream .  lipase/protease/amylase (CREON) 36000 UNITS CPEP capsule, Take 2 capsules by mouth with each meal, 1 capsule by mouth with each snack .  Magnesium 200 MG TABS, Take 1 tablet (200 mg total) by mouth daily. .  mirabegron ER (MYRBETRIQ) 25 MG TB24 tablet, TAKE 25 MG BY MOUTH DAILY. .  Multiple Vitamins-Minerals (CENTRUM SILVER 50+WOMEN PO), Take 1 tablet by mouth daily. .  mupirocin cream (BACTROBAN) 2 %, Apply  1 application topically 2 (two) times daily. .  mupirocin ointment (BACTROBAN) 2 %, Apply 1 application topically 2 (two)  times daily. .  mupirocin ointment (BACTROBAN) 2 %, APPLY 1 APPLICATION ON TO THE SKIN 2 TIMES DAILY .  naloxone (NARCAN) 4 MG/0.1ML LIQD nasal spray kit, Narcan 4 mg/actuation nasal spray  PLACE 1 SPRAY INTO EACH NOSTRIL FOR SUSPECTED OPIOID OVERDOSE. MAY REPEAT 2 - 3 MINUTES WITH NEW BOTTLE IN OTHER NOSTRIL .  Omega-3 Fatty Acids (FISH OIL BURP-LESS) 1000 MG CAPS, Takes 1000 mg BID .  omeprazole (PRILOSEC) 40 MG capsule, TAKE 1 CAPSULE BY MOUTH ONCE DAILY .  ondansetron (ZOFRAN) 4 MG tablet, Take 1 tablet (4 mg total) by mouth every 8 (eight) hours as needed. .  sertraline (ZOLOFT) 25 MG tablet, TAKE 1 TABLET (25 MG TOTAL) BY MOUTH DAILY. Marland Kitchen  triamcinolone (KENALOG) 0.1 %, APPLY ONE APPLICATION ON THE SKIN TWICE DAILY. TAPER USE AS ABLE .  vitamin E 400 UNIT capsule, Take 400 Units by mouth daily. Marland Kitchen  zinc gluconate 50 MG tablet, Take 50 mg by mouth daily.   Reviewed prior external information including notes and imaging from  primary care provider As well as notes that were available from care everywhere and other healthcare systems.  Past medical history, social, surgical and family history all reviewed in electronic medical record.  No pertanent information unless stated regarding to the chief complaint.   Review of Systems:  No headache, visual changes, nausea, vomiting, diarrhea, constipation, dizziness, abdominal pain, skin rash, fevers, chills, night sweats, weight loss, swollen lymph nodes, body aches, joint swelling, chest pain, shortness of breath, mood changes. POSITIVE muscle aches  Objective  Blood pressure 140/80, pulse 67, height 5' 2"  (1.575 m), weight 156 lb (70.8 kg), SpO2 98 %.   General: No apparent distress alert and oriented x3 mood and affect normal, dressed appropriately.  HEENT: Pupils equal, extraocular movements intact  Respiratory: Patient's speak in full sentences and does not appear short of breath  Cardiovascular: No lower extremity edema, non tender, no  erythema  Gait mild antalgic MSK: Right ankle still has some mild swelling.  Tender to palpation diffusely. Right knee exam does have some arthritic changes noted.  Mild crepitus noted.  Mild instability with valgus and varus force.  Does have some fullness noted in the popliteal area  After informed written and verbal consent, patient was seated on exam table. Right knee was prepped with alcohol swab and utilizing anterolateral approach, patient's right knee space was injected with 4:1  marcaine 0.5%: Kenalog 7m/dL. Patient tolerated the procedure well without immediate complications.   Impression and Recommendations:     The above documentation has been reviewed and is accurate and complete [name] Pulley DO

## 2020-11-24 ENCOUNTER — Other Ambulatory Visit: Payer: Self-pay

## 2020-11-24 ENCOUNTER — Encounter: Payer: Self-pay | Admitting: Family Medicine

## 2020-11-24 ENCOUNTER — Ambulatory Visit (INDEPENDENT_AMBULATORY_CARE_PROVIDER_SITE_OTHER): Payer: Medicare Other | Admitting: Family Medicine

## 2020-11-24 ENCOUNTER — Telehealth: Payer: Self-pay | Admitting: Internal Medicine

## 2020-11-24 ENCOUNTER — Other Ambulatory Visit (HOSPITAL_BASED_OUTPATIENT_CLINIC_OR_DEPARTMENT_OTHER): Payer: Self-pay

## 2020-11-24 DIAGNOSIS — M1711 Unilateral primary osteoarthritis, right knee: Secondary | ICD-10-CM | POA: Diagnosis not present

## 2020-11-24 DIAGNOSIS — M25471 Effusion, right ankle: Secondary | ICD-10-CM

## 2020-11-24 DIAGNOSIS — I2510 Atherosclerotic heart disease of native coronary artery without angina pectoris: Secondary | ICD-10-CM | POA: Diagnosis not present

## 2020-11-24 DIAGNOSIS — M170 Bilateral primary osteoarthritis of knee: Secondary | ICD-10-CM | POA: Insufficient documentation

## 2020-11-24 NOTE — Patient Instructions (Signed)
Good to see you [name] Injected knee today  Watch the baker cyst See me again in 4-6 weeks

## 2020-11-24 NOTE — Assessment & Plan Note (Signed)
Patient does have an OCD which likely does give her some instability.  Discussed about potential custom bracing, home exercises and icing regimen.  Patient declined do anything more aggressive at this time but will continue to follow-up with me.

## 2020-11-24 NOTE — Assessment & Plan Note (Signed)
Patient has had a Baker's cyst previously.  Patient's knee does seems to be large and somewhat swollen diffusely.  We discussed that if this does not work we will consider the possibility of aspiration of the Baker's cyst.  Has been 3 years since then.  Patient usually does respond well though to home exercises, icing regimen.  Patient declined any type of bracing at the moment.  Follow-up again in 6 weeks.  Could be a candidate for possible viscosupplementation

## 2020-11-24 NOTE — Telephone Encounter (Signed)
LVM for pt to rtn my call to schedule awv with nha. Please schedule this appt if pt calls the office.

## 2020-11-28 NOTE — Progress Notes (Signed)
Subjective:    Patient ID: [name], female    DOB: 09/27/1945, 75 y.o.   MRN: [id number]  HPI The patient is here for follow up of their chronic medical problems, including DM, htn, hyperlipidemia, GERD, anxiety and depression  Her bug bite is still there.  She was doing yard work yesterday and felt like she was bit on the left inner thigh. It has a burning sensation, but no pain. It is red.   She is active.  She does pretty good with her diet - she goes through phases of dieting poorly.     She has noticed some hair loss.  diffuse hair loss, but thinner in front.      Medications and allergies reviewed with patient and updated if appropriate.  Patient Active Problem List   Diagnosis Date Noted  . Degenerative arthritis of right knee 11/24/2020  . Cellulitis and abscess of buttock 08/17/2020  . Right ankle effusion 05/10/2020  . Elevated alkaline phosphatase level 04/20/2020  . Neuralgia 04/20/2020  . Swelling 12/19/2018  . 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 Atypical chest pain 05/21/2018  . Epigastric pain 05/21/2018  . Baker's cyst of knee, right 05/07/2018  . Perennial and seasonal allergic rhinitis 04/18/2018  . Onychomycosis of toenail 03/04/2018  . Hypercalcemia 09/17/2017  . Left wrist pain 09/17/2017  . Synovitis of left knee 05/22/2017  . Diabetes ([place]) 05/02/2017  . Trigger point of left shoulder region 03/27/2017  . Taste disorder 02/22/2017  . Anxiety and depression 02/12/2017  . Caregiver with fatigue 01/17/2017  . Psychosocial stressors 01/17/2017  . Decreased sense of taste 01/15/2017  . Family history of osteoporosis 11/16/2016  . Atherosclerotic plaque 11/07/2016  . 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 Osteoporosis 06/21/2016  . Dizziness 05/16/2016  . Arthritis of left shoulder region 01/17/2016  . Ankylosing spondylitis ([place]) 11/08/2015  . Incontinence  10/28/2015  . Thoracic back pain 10/19/2015  . Leg cramping 09/29/2015  . 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 Impingement syndrome of left shoulder region 09/22/2014  . Interstitial cystitis 07/09/2014  . Diverticulosis of colon without hemorrhage 07/08/2014  . Carpal tunnel syndrome of left wrist 04/12/2014  . Food allergy 03/06/2014  . Renal calculi 01/30/2014  . OSA (obstructive sleep apnea), moderate 01/14/2014  . Allergic conjunctivitis 12/06/2013  . History of Bell's palsy 02/26/2012  . Vitamin D deficiency 06/27/2010  . 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  . DRY MOUTH 06/27/2010  . Gastroesophageal reflux disease 04/30/2009  . History of colonic polyps 04/30/2009  . Hyperlipidemia 08/21/2007  . Essential hypertension 08/21/2007  . Arthralgia 08/21/2007  . Chronic lumbar radiculopathy 01/25/2007    Current Outpatient Medications on File Prior to Visit  Medication Sig Dispense Refill  . acetaminophen (TYLENOL) 500 MG tablet Take 1,000 mg by mouth 3 (three) times daily as needed.     Marland Kitchen amLODipine (NORVASC) 2.5 MG tablet Take 1 tablet (2.5 mg total) by mouth daily. 90 tablet 2  . Ascorbic Acid (VITAMIN C) 1000 MG tablet Take 1,000 mg by mouth 2 (two) times daily.     Marland Kitchen aspirin 81 MG tablet Take 81 mg by mouth daily.    Marland Kitchen azelastine (ASTELIN) 0.1 % nasal spray One to two sprays each nostril twice a day as needed. 30 mL 5  . cetirizine (ZYRTEC) 10 MG tablet Take 10 mg by mouth daily.    . Cholecalciferol (VITAMIN D-3) 25  MCG (1000 UT) CAPS Take 10,000 Units by mouth daily.     . diclofenac Sodium (VOLTAREN) 1 % GEL Apply topically 4 (four) times daily as needed.     . dicyclomine (BENTYL) 10 MG capsule Take 1 capsule (10 mg total) by mouth 3 (three) times daily as needed for spasms. 90 capsule 2  . diphenoxylate-atropine (LOMOTIL) 2.5-0.025 MG tablet TAKE 1 TABLET BY MOUTH 4 TIMES DAILY AS NEEDED FOR DIARRHEA OR LOOSE STOOLS 90 tablet 2  . EPINEPHrine (EPIPEN 2-PAK) 0.3  mg/0.3 mL IJ SOAJ injection Use as directed for severe allergic reactions 2 each 1  . estradiol (ESTRACE) 0.1 MG/GM vaginal cream estradiol 0.01% (0.1 mg/gram) vaginal cream    . HYDROcodone-acetaminophen (NORCO/VICODIN) 5-325 MG tablet hydrocodone 5 mg-acetaminophen 325 mg tablet  TAKE 1 TABLET BY MOUTH THREE TIMES DAILY AS NEEDED FOR 7 DAYS    . levocetirizine (XYZAL) 5 MG tablet     . lipase/protease/amylase (CREON) 36000 UNITS CPEP capsule Take 2 capsules by mouth with each meal, 1 capsule by mouth with each snack 720 capsule 1  . Magnesium 200 MG TABS Take 1 tablet (200 mg total) by mouth daily. 30 each 6  . Multiple Vitamins-Minerals (CENTRUM SILVER 50+WOMEN PO) Take 1 tablet by mouth daily.    . mupirocin ointment (BACTROBAN) 2 % APPLY 1 APPLICATION ON TO THE SKIN 2 TIMES DAILY 22 g 2  . naloxone (NARCAN) 4 MG/0.1ML LIQD nasal spray kit Narcan 4 mg/actuation nasal spray  PLACE 1 SPRAY INTO EACH NOSTRIL FOR SUSPECTED OPIOID OVERDOSE. MAY REPEAT 2 - 3 MINUTES WITH NEW BOTTLE IN OTHER NOSTRIL    . olopatadine (PATANOL) 0.1 % ophthalmic solution     . Omega-3 Fatty Acids (FISH OIL BURP-LESS) 1000 MG CAPS Takes 1000 mg BID 60 capsule 0  . omeprazole (PRILOSEC) 40 MG capsule TAKE 1 CAPSULE BY MOUTH ONCE DAILY 90 capsule 2  . ondansetron (ZOFRAN) 4 MG tablet Take 1 tablet (4 mg total) by mouth every 8 (eight) hours as needed. 40 tablet 2  . rosuvastatin (CRESTOR) 20 MG tablet Take 1 tablet (20 mg total) by mouth daily. 90 tablet 1  . sertraline (ZOLOFT) 25 MG tablet TAKE 1 TABLET (25 MG TOTAL) BY MOUTH DAILY. 90 tablet 1  . triamcinolone (KENALOG) 0.1 % APPLY ONE APPLICATION ON THE SKIN TWICE DAILY. TAPER USE AS ABLE 454 g 3  . vitamin B-12 (CYANOCOBALAMIN) 1000 MCG tablet Take 1,000 mcg by mouth 2 (two) times daily.     . vitamin E 400 UNIT capsule Take 400 Units by mouth daily.    Marland Kitchen zinc gluconate 50 MG tablet Take 50 mg by mouth daily.     No current facility-administered medications on file  prior to visit.    Past Medical History:  Diagnosis Date  . Allergy    seasonal  . Ankylosing spondylitis ([place])   . Anxiety   . Arthritis   . Bell palsy   . CAD (coronary artery disease)    a. Coronary CT 10/2016 showed calcium score in the 59th perecntile, moderate CAD in RCA/PDA and narrowing of the mid to distal LAD, with negative FFR.  . CTS (carpal tunnel syndrome)   . Cystitis   . Diabetes mellitus without complication ([place])   . Diverticulitis   . Diverticulosis of colon   . Dry eye syndrome   . Exocrine pancreatic insufficiency   . Frozen shoulder   . GERD (gastroesophageal reflux disease)   . HLA B27 (HLA B27  positive)   . Hx of colonic polyps   . Hyperlipidemia    NMR 2005  . Hypertension   . Hypothyroidism   . Incontinence   . Internal hemorrhoids   . Interstitial cystitis   . Kidney stone   . Menopause   . Mild aortic insufficiency   . Mild ascending aorta dilatation ([facility])   . Osteoarthritis   . Osteopenia    [facility] [place]   . S/P total hysterectomy and bilateral salpingo-oophorectomy 04/12/2014  . Sinusitis   . Sleep apnea    c-pap  . Subjective visual disturbance of both eyes 12/30/2013  . Tubular adenoma of colon   . Vitamin D deficiency   . Xerostomia    and Xeroophthalmia    Past Surgical History:  Procedure Laterality Date  . BLADDER SURGERY     for incontinence  . CARPAL TUNNEL RELEASE Left 12/22/2014   Procedure: LEFT CARPAL TUNNEL RELEASE;  Surgeon: Daryll Brod, MD;  Location: [facility];  Service: Orthopedics;  Laterality: Left;  . CARPAL TUNNEL RELEASE Right 02/18/2019   Procedure: RIGHT CARPAL TUNNEL RELEASE;  Surgeon: Daryll Brod, MD;  Location: [facility];  Service: Orthopedics;  Laterality: Right;  . COLONOSCOPY W/ POLYPECTOMY     adenomatous poyp 04-2008  . colonscopy     tics 10-2002  . CYSTOSCOPY WITH RETROGRADE PYELOGRAM, URETEROSCOPY AND STENT PLACEMENT Left 09/01/2013    Procedure: CYSTOSCOPY WITH RETROGRADE PYELOGRAM, AND LEFT STENT PLACEMENT;  Surgeon: Molli Hazard, MD;  Location: [facility];  Service: Urology;  Laterality: Left;  . elevated LFTs     due to [place]  . FINGER SURGERY Left 2004   2nd finger  . G 1 P 1    . interstitial cystitis     with clot in catheter  . PARTIAL HYSTERECTOMY     with one ovary left  . SHOULDER ARTHROSCOPY Left   . TOTAL ABDOMINAL HYSTERECTOMY W/ BILATERAL SALPINGOOPHORECTOMY     dysfunctional menses    Social History   Socioeconomic History  . Marital status: Married    Spouse name: Not on file  . Number of children: 1  . Years of education: BS  . Highest education level: Not on file  Occupational History  . Occupation: retired  . Occupation: Pharmacist, hospital    Comment: Kindergarten  Tobacco Use  . Smoking status: Never Smoker  . Smokeless tobacco: Never Used  Vaping Use  . Vaping Use: Never used  Substance and Sexual Activity  . Alcohol use: Yes    Comment: social  . Drug use: No  . Sexual activity: Not Currently  Other Topics Concern  . Not on file  Social History Narrative   Lives at home with husband.   No daily use of caffeine.   Right-handed.         Social Determinants of Health   Financial Resource Strain: Not on file  Food Insecurity: Not on file  Transportation Needs: Not on file  Physical Activity: Not on file  Stress: Not on file  Social Connections: Not on file    Family History  Problem Relation Age of Onset  . Coronary artery disease Mother   . Hypertension Mother   . Asthma Mother   . Dementia Mother   . Stroke Mother        mini cva  . Diabetes Father   . Cirrhosis Father        non  alcoholic  . Colon cancer Sister 43  . Allergic rhinitis Sister   . Allergic rhinitis Sister   . Food Allergy Sister   . Renal cancer Paternal Aunt        x 2  . Coronary artery disease Maternal Grandfather   . Breast cancer Maternal Aunt   . Diabetes Paternal Aunt   . Diabetes  Paternal Grandmother   . Esophageal cancer Neg Hx   . Stomach cancer Neg Hx     Review of Systems  Constitutional: Negative for chills and fever.  Respiratory: Positive for cough (allergy related). Negative for shortness of breath and wheezing.   Cardiovascular: Positive for chest pain (occ L upper chest pain - ? related to shoulder) and leg swelling (occ). Negative for palpitations.  Skin:       Bug bite L inner thigh  Neurological: Positive for light-headedness (occ). Negative for headaches.       Objective:   Vitals:   11/29/20 1339  BP: 134/78  Pulse: 68  Temp: 98.7 F (37.1 C)  SpO2: 96%   BP Readings from Last 3 Encounters:  11/29/20 134/78  11/24/20 140/80  08/31/20 122/78   Wt Readings from Last 3 Encounters:  11/29/20 155 lb (70.3 kg)  11/24/20 156 lb (70.8 kg)  08/31/20 154 lb (69.9 kg)   Body mass index is 28.35 kg/m.   Physical Exam    Constitutional: Appears well-developed and well-nourished. No distress.  HENT:  Head: Normocephalic and atraumatic.  Neck: Neck supple. No tracheal deviation present. No thyromegaly present.  No cervical lymphadenopathy Cardiovascular: Normal rate, regular rhythm and normal heart sounds.   No murmur heard. No carotid bruit .  No edema Pulmonary/Chest: Effort normal and breath sounds normal. No respiratory distress. No has no wheezes. No rales.  Abdomen: soft, NT, ND Skin: Skin is warm and dry. Not diaphoretic.  Area of redness left inner thigh w/o tenderness/warmth.  No fluctuance or induration.  ( new bug bite)   Bruise like area on right buttock - nontender, small firmness in center - not tender (old bug bite) Psychiatric: Normal mood and affect. Behavior is normal.      Assessment & Plan:    See Problem List for Assessment and Plan of chronic medical problems.    This visit occurred during the SARS-CoV-2 public health emergency.  Safety protocols were in place, including screening questions prior to the  visit, additional usage of staff PPE, and extensive cleaning of exam room while observing appropriate contact time as indicated for disinfecting solutions.

## 2020-11-28 NOTE — Patient Instructions (Addendum)
  Blood work was ordered.     Medications changes include :   augmentin for your bug bite.    Your prescription(s) have been submitted to your pharmacy. Please take as directed and contact our office if you believe you are having problem(s) with the medication(s).     Please followup in 6 months

## 2020-11-29 ENCOUNTER — Other Ambulatory Visit: Payer: Self-pay

## 2020-11-29 ENCOUNTER — Encounter: Payer: Self-pay | Admitting: Internal Medicine

## 2020-11-29 ENCOUNTER — Ambulatory Visit (INDEPENDENT_AMBULATORY_CARE_PROVIDER_SITE_OTHER): Payer: Medicare Other | Admitting: Internal Medicine

## 2020-11-29 ENCOUNTER — Other Ambulatory Visit (HOSPITAL_BASED_OUTPATIENT_CLINIC_OR_DEPARTMENT_OTHER): Payer: Self-pay

## 2020-11-29 VITALS — BP 134/78 | HR 68 | Temp 98.7°F | Ht 62.0 in | Wt 155.0 lb

## 2020-11-29 DIAGNOSIS — F32A Depression, unspecified: Secondary | ICD-10-CM

## 2020-11-29 DIAGNOSIS — I2510 Atherosclerotic heart disease of native coronary artery without angina pectoris: Secondary | ICD-10-CM

## 2020-11-29 DIAGNOSIS — F419 Anxiety disorder, unspecified: Secondary | ICD-10-CM | POA: Diagnosis not present

## 2020-11-29 DIAGNOSIS — I10 Essential (primary) hypertension: Secondary | ICD-10-CM | POA: Diagnosis not present

## 2020-11-29 DIAGNOSIS — L659 Nonscarring hair loss, unspecified: Secondary | ICD-10-CM

## 2020-11-29 DIAGNOSIS — S70362A Insect bite (nonvenomous), left thigh, initial encounter: Secondary | ICD-10-CM | POA: Diagnosis not present

## 2020-11-29 DIAGNOSIS — E119 Type 2 diabetes mellitus without complications: Secondary | ICD-10-CM | POA: Diagnosis not present

## 2020-11-29 DIAGNOSIS — E782 Mixed hyperlipidemia: Secondary | ICD-10-CM | POA: Diagnosis not present

## 2020-11-29 DIAGNOSIS — W57XXXA Bitten or stung by nonvenomous insect and other nonvenomous arthropods, initial encounter: Secondary | ICD-10-CM | POA: Insufficient documentation

## 2020-11-29 DIAGNOSIS — K219 Gastro-esophageal reflux disease without esophagitis: Secondary | ICD-10-CM

## 2020-11-29 LAB — COMPREHENSIVE METABOLIC PANEL
ALT: 30 U/L (ref 0–35)
AST: 31 U/L (ref 0–37)
Albumin: 4.3 g/dL (ref 3.5–5.2)
Alkaline Phosphatase: 94 U/L (ref 39–117)
BUN: 23 mg/dL (ref 6–23)
CO2: 26 mEq/L (ref 19–32)
Calcium: 9.5 mg/dL (ref 8.4–10.5)
Chloride: 106 mEq/L (ref 96–112)
Creatinine, Ser: 0.99 mg/dL (ref 0.40–1.20)
GFR: 55.27 mL/min — ABNORMAL LOW (ref >60.00)
Glucose, Bld: 119 mg/dL — ABNORMAL HIGH (ref 70–99)
Potassium: 3.7 mEq/L (ref 3.5–5.1)
Sodium: 141 mEq/L (ref 135–145)
Total Bilirubin: 0.4 mg/dL (ref 0.2–1.2)
Total Protein: 6.9 g/dL (ref 6.0–8.3)

## 2020-11-29 LAB — T4, FREE: Free T4: 0.65 ng/dL (ref 0.60–1.60)

## 2020-11-29 LAB — HEMOGLOBIN A1C: Hgb A1c MFr Bld: 6.3 % (ref 4.6–6.5)

## 2020-11-29 LAB — TSH: TSH: 2.16 u[IU]/mL (ref 0.35–4.50)

## 2020-11-29 LAB — T3, FREE: T3, Free: 2.9 pg/mL (ref 2.3–4.2)

## 2020-11-29 MED ORDER — ROSUVASTATIN CALCIUM 5 MG PO TABS
5.0000 mg | ORAL_TABLET | Freq: Every day | ORAL | 0 refills | Status: DC
  Filled 2020-11-29: qty 8, 8d supply, fill #0

## 2020-11-29 MED ORDER — AMOXICILLIN-POT CLAVULANATE 875-125 MG PO TABS
1.0000 | ORAL_TABLET | Freq: Two times a day (BID) | ORAL | 0 refills | Status: DC
  Filled 2020-11-29: qty 20, 10d supply, fill #0

## 2020-11-29 MED ORDER — OLOPATADINE HCL 0.1 % OP SOLN
1.0000 [drp] | Freq: Two times a day (BID) | OPHTHALMIC | 11 refills | Status: DC
  Filled 2020-11-29: qty 5, 50d supply, fill #0

## 2020-11-29 NOTE — Assessment & Plan Note (Signed)
Chronic BP well controlled Continue amlodipine 2.5 mg qd cmp

## 2020-11-29 NOTE — Assessment & Plan Note (Signed)
Chronic Controlled with diet Low sugar / carb diet, stay active a1c today

## 2020-11-29 NOTE — Assessment & Plan Note (Signed)
Chronic Fairly controlled - gets upset less easily Continue sertraline 25 mg daily - discussed that we can increase if needed

## 2020-11-29 NOTE — Assessment & Plan Note (Signed)
Acute Losing more hair diffusely and thinning in front Advised to see derm Will check tfts B12 good a few months ago

## 2020-11-29 NOTE — Addendum Note (Signed)
Addended by: Boris Lown B on: 11/29/2020 02:40 PM   Modules accepted: Orders

## 2020-11-29 NOTE — Assessment & Plan Note (Signed)
Acute Left inner thigh - there is some redness, no tenderness Given her last experience I am concerned this is have the same pattern She will monitor for the next 24 hrs - if it worsens she will start Augmentin 875-125 mg BID x 10 days

## 2020-11-29 NOTE — Assessment & Plan Note (Addendum)
Chronic Taking crestor 5 mg daily Lipids looked good - Dr Meda Coffee recommended increasing dose to 20 mg daily, but she is hesitant to go on the higher dose -- will talk to her new cardiologist  - has upcoming appt Will renew a few days of crestor 5 mg daily for now

## 2020-12-02 ENCOUNTER — Encounter: Payer: Self-pay | Admitting: Physician Assistant

## 2020-12-02 NOTE — Progress Notes (Signed)
Cardiology Office Note    Date:  12/06/2020   ID:  [name], DOB [birth year], MRN [id number]  PCP:  Binnie Rail, MD  Cardiologist:  Ena Dawley, MD (Inactive)  Electrophysiologist:  None   Chief Complaint: f/u CAD, SVT  History of Present Illness:   [name] is a 75 y.o. female with history of moderate CAD by coronary CT, aortic atherosclerosis, mild-moderate AI by echo 06/2020, mildly dilated ascending aorta, HTN, PSVT, ankylosing spondylitis/HLA B27 positive, anxiety, arthritis, Bell palsy, diverticulosis, pancreatic insufficiency, HLD (prior possible intolerance to statin), HTN, interstitial cystitis, OA, pre-DM, OSA on CPAP (followed at [facility]) who presents for routine cardiology evaluation. [name] is also followed by our clinic.  She has been established with Dr. Meda Coffee for history of chest pain in the context of HLA B27 positivity. Prior nuclear stress test 12/2012 was normal and coronary CT 10/2016 showed moderate CAD with negative FFR. She was previously followed by our pharmD lipid clinic with history of myalgias, unclear if totally related to statin (symptoms sounded more consistent with restless legs). When I saw her in July she was reporting lower extremity edema and facial edema not appreciated on exam. We discontinued amlodipine and changed to HCTZ but amlodipine was back on med list when she saw Dr. Meda Coffee in 07/2020 complaining of palpitations and chest pain. Repeat coronary CTA 07/2020 showed moderate stenosis in the prox and distal RCA, mild stenosis in the prox LAD with negative FFR, no abnormalities mentioned in aorta. Event monitor showed NSR with short runs of SVT, rare PVCs, PACs, average HR 75bpm, range 42-176 (low reading was during sleeping hours). Dr. Meda Coffee suggested consideration of flecainide and patient wished to defer. She saw the lipid clinic back in follow-up 08/2020, options were discussed, and patient was agreeable to increase statin from 3x/week  to daily. Dr. Meda Coffee further increased this to 67m daily in 09/2020 given progression in CT calcium score (LDL 59). Of note, last echo 06/2020 EF 60-65%, mild LVH, grade 1 DD, mild dilation of ascending aorta as previously noted.  She returns for follow-up with her family. In general she is doing well. She is not particularly bothered by palpitations at this time. She has infrequent bouts of brief chest pain, sometimes lasting seconds or up to 5 minutes, sometimes with a sharp needle-like quality. She has not had any chest pain that sounds anginal in nature. These episodes are not provoked by activity. She has a hard time quantifying them - does not sound like they interfere with every day life. The discomfort that lasts 5 minutes is relieved with direct pressure to the sternum. She is able to participate in exertion without any cardiac limiting symptoms. This is chronic and has been reported in previous years' visits as well. She states she previously saw a nerve doctor for her tingly leg sensation and was told it was likely nerve related. She had continued the 572mof Crestor daily as that was what our pharmacy team had recommended. Since switching from 3x/week to daily she had not seen any intensification of the leg feeling. She also gets some sporadic leg cramping the day after an activity, such a charley horse. No claudication-like symptoms.  Labwork independently reviewed: 11/2020 K 3.7, Cr 0.99, LFTs ok, A1C 6.3, thyroid normal 09/2020 LDL 59  Past Medical History:  Diagnosis Date  . Allergy    seasonal  . Ankylosing spondylitis ([facility]  . Anxiety   . Arthritis   .  Bell palsy   . CAD (coronary artery disease)    a. Coronary CT 10/2016 showed calcium score in the 59th perecntile, moderate CAD in RCA/PDA and narrowing of the mid to distal LAD, with negative FFR.  . CTS (carpal tunnel syndrome)   . Cystitis   . Diabetes mellitus without complication ([place])   . Diverticulitis   . Diverticulosis of  colon   . Dry eye syndrome   . Exocrine pancreatic insufficiency   . Frozen shoulder   . GERD (gastroesophageal reflux disease)   . HLA B27 (HLA B27 positive)   . Hx of colonic polyps   . Hyperlipidemia    NMR 2005  . Hypertension   . Hypothyroidism   . Incontinence   . Internal hemorrhoids   . Interstitial cystitis   . Kidney stone   . Menopause   . Mild aortic insufficiency   . Mild ascending aorta dilatation (H[facility])   . Osteoarthritis   . Osteopenia    [facility] [place]   . PSVT (paroxysmal supraventricular tachycardia) ([place])   . S/P total hysterectomy and bilateral salpingo-oophorectomy 04/12/2014  . Sinusitis   . Sleep apnea    c-pap  . Subjective visual disturbance of both eyes 12/30/2013  . Tubular adenoma of colon   . Vitamin D deficiency   . Xerostomia    and Xeroophthalmia    Past Surgical History:  Procedure Laterality Date  . BLADDER SURGERY     for incontinence  . CARPAL TUNNEL RELEASE Left 12/22/2014   Procedure: LEFT CARPAL TUNNEL RELEASE;  Surgeon: Daryll Brod, MD;  Location: [place];  Service: Orthopedics;  Laterality: Left;  . CARPAL TUNNEL RELEASE Right 02/18/2019   Procedure: RIGHT CARPAL TUNNEL RELEASE;  Surgeon: Daryll Brod, MD;  Location: [place];  Service: Orthopedics;  Laterality: Right;  . COLONOSCOPY W/ POLYPECTOMY     adenomatous poyp 04-2008  . colonscopy     tics 10-2002  . CYSTOSCOPY WITH RETROGRADE PYELOGRAM, URETEROSCOPY AND STENT PLACEMENT Left 09/01/2013   Procedure: CYSTOSCOPY WITH RETROGRADE PYELOGRAM, AND LEFT STENT PLACEMENT;  Surgeon: Molli Hazard, MD;  Location: [facility];  Service: Urology;  Laterality: Left;  . elevated LFTs     due to [place]  . FINGER SURGERY Left 2004   2nd finger  . G 1 P 1    . interstitial cystitis     with clot in catheter  . PARTIAL HYSTERECTOMY     with one ovary left  . SHOULDER ARTHROSCOPY Left   . TOTAL ABDOMINAL HYSTERECTOMY  W/ BILATERAL SALPINGOOPHORECTOMY     dysfunctional menses    Current Medications: Current Meds  Medication Sig  . acetaminophen (TYLENOL) 500 MG tablet Take 1,000 mg by mouth 3 (three) times daily as needed.   Marland Kitchen amLODipine (NORVASC) 2.5 MG tablet Take 1 tablet (2.5 mg total) by mouth daily.  . Ascorbic Acid (VITAMIN C) 1000 MG tablet Take 1,000 mg by mouth 2 (two) times daily.   Marland Kitchen aspirin 81 MG tablet Take 81 mg by mouth daily.  Marland Kitchen azelastine (ASTELIN) 0.1 % nasal spray One to two sprays each nostril twice a day as needed.  . cetirizine (ZYRTEC) 10 MG tablet Take 10 mg by mouth daily.  . Cholecalciferol (VITAMIN D-3) 25 MCG (1000 UT) CAPS Take 10,000 Units by mouth daily.   . diclofenac Sodium (VOLTAREN) 1 % GEL Apply topically 4 (four) times daily as needed.   Marland Kitchen  dicyclomine (BENTYL) 10 MG capsule Take 1 capsule (10 mg total) by mouth 3 (three) times daily as needed for spasms.  . diphenoxylate-atropine (LOMOTIL) 2.5-0.025 MG tablet TAKE 1 TABLET BY MOUTH 4 TIMES DAILY AS NEEDED FOR DIARRHEA OR LOOSE STOOLS  . EPINEPHrine (EPIPEN 2-PAK) 0.3 mg/0.3 mL IJ SOAJ injection Use as directed for severe allergic reactions  . estradiol (ESTRACE) 0.1 MG/GM vaginal cream estradiol 0.01% (0.1 mg/gram) vaginal cream  . HYDROcodone-acetaminophen (NORCO/VICODIN) 5-325 MG tablet hydrocodone 5 mg-acetaminophen 325 mg tablet  TAKE 1 TABLET BY MOUTH THREE TIMES DAILY AS NEEDED FOR 7 DAYS  . lipase/protease/amylase (CREON) 36000 UNITS CPEP capsule Take 2 capsules by mouth with each meal, 1 capsule by mouth with each snack  . Magnesium 200 MG TABS Take 1 tablet (200 mg total) by mouth daily.  . Multiple Vitamins-Minerals (CENTRUM SILVER 50+WOMEN PO) Take 1 tablet by mouth daily.  . mupirocin ointment (BACTROBAN) 2 % APPLY 1 APPLICATION ON TO THE SKIN 2 TIMES DAILY  . naloxone (NARCAN) 4 MG/0.1ML LIQD nasal spray kit Narcan 4 mg/actuation nasal spray  PLACE 1 SPRAY INTO EACH NOSTRIL FOR SUSPECTED OPIOID OVERDOSE.  MAY REPEAT 2 - 3 MINUTES WITH NEW BOTTLE IN OTHER NOSTRIL  . olopatadine (PATANOL) 0.1 % ophthalmic solution Place 1 drop into both eyes 2 (two) times daily.  . Omega-3 Fatty Acids (FISH OIL BURP-LESS) 1000 MG CAPS Takes 1000 mg BID  . omeprazole (PRILOSEC) 40 MG capsule TAKE 1 CAPSULE BY MOUTH ONCE DAILY  . ondansetron (ZOFRAN) 4 MG tablet Take 1 tablet (4 mg total) by mouth every 8 (eight) hours as needed.  . rosuvastatin (CRESTOR) 20 MG tablet Take 1 tablet (20 mg total) by mouth daily.  . sertraline (ZOLOFT) 25 MG tablet TAKE 1 TABLET (25 MG TOTAL) BY MOUTH DAILY.  . vitamin B-12 (CYANOCOBALAMIN) 1000 MCG tablet Take 1,000 mcg by mouth 2 (two) times daily.   . vitamin E 400 UNIT capsule Take 400 Units by mouth daily.  Marland Kitchen zinc gluconate 50 MG tablet Take 50 mg by mouth daily.  . [DISCONTINUED] rosuvastatin (CRESTOR) 5 MG tablet Take 1 tablet (5 mg total) by mouth daily.      Allergies:   Bactrim [sulfamethoxazole-trimethoprim], Nitroglycerin, Pentosan polysulfate, Pentosan polysulfate sodium, Promethazine hcl, Sulfamethoxazole-trimethoprim, Losartan, Promethazine hcl, and Lisinopril   Social History   Socioeconomic History  . Marital status: Married    Spouse name: Not on file  . Number of children: 1  . Years of education: BS  . Highest education level: Not on file  Occupational History  . Occupation: retired  . Occupation: Pharmacist, hospital    Comment: Kindergarten  Tobacco Use  . Smoking status: Never Smoker  . Smokeless tobacco: Never Used  Vaping Use  . Vaping Use: Never used  Substance and Sexual Activity  . Alcohol use: Yes    Comment: social  . Drug use: No  . Sexual activity: Not Currently  Other Topics Concern  . Not on file  Social History Narrative   Lives at home with husband.   No daily use of caffeine.   Right-handed.         Social Determinants of Health   Financial Resource Strain: Not on file  Food Insecurity: Not on file  Transportation Needs: Not on file   Physical Activity: Not on file  Stress: Not on file  Social Connections: Not on file     Family History:  The patient's family history includes Allergic rhinitis in her sister and  sister; Asthma in her mother; Breast cancer in her maternal aunt; Cirrhosis in her father; Colon cancer (age of onset: 47) in her sister; Coronary artery disease in her maternal grandfather and mother; Dementia in her mother; Diabetes in her father, paternal aunt, and paternal grandmother; Food Allergy in her sister; Hypertension in her mother; Renal cancer in her paternal aunt; Stroke in her mother. There is no history of Esophageal cancer or Stomach cancer.  ROS:   Please see the history of present illness.  All other systems are reviewed and otherwise negative.    EKGs/Labs/Other Studies Reviewed:    Studies reviewed are outlined and summarized above. Reports included below if pertinent.  Cor CTA 1/202 FINDINGS: A 100 kV prospective scan was triggered in the descending thoracic aorta at 111 HU's. Axial non-contrast 3 mm slices were carried out through the heart. The data set was analyzed on a dedicated work station and scored using the [place]. Gantry rotation speed was 250 msecs and collimation was .6 mm. 50 mg of PO Metoprolol and 0.8 mg of sl NTG was given. The 3D data set was reconstructed in 5% intervals of the 67-82 % of the R-R cycle. Diastolic phases were analyzed on a dedicated work station using MPR, MIP and VRT modes. The patient received 80 cc of contrast.  Aorta: Normal size. Minimal atherosclerotic plaque and calcifications. No dissection.  Aortic Valve:  Trileaflet.  Trivial calcifications.  Coronary Arteries:  Normal coronary origin.  Right dominance.  RCA is a large dominant artery that gives rise to PDA and PLA. There is moderate diffuse calcified plaque in the proximal portion with stenosis 50-69%. Distal RCA has moderate mixed plaque with stenosis 50-69%. PLA and  PDA are small with minimal plaque.  Left main is a large artery that gives rise to LAD and LCX arteries. Left main is a large vessel with only luminal irregularities.  LAD is a large vessel that gives rise to one diagonal artery. There is mild predominantly calcified plaque in the proximal portion with stenosis 25-49%. Mid and distal LAD have only luminal irregularities.  D1 is small and has mild calcified focal stenosis in the proximal portion with stenosis 25-49%.  LCX is a non-dominant artery that gives rise to one large OM1 branch. There are only minimal irregularities.  Other findings:  Normal pulmonary vein drainage into the left atrium.  Normal left atrial appendage without a thrombus.  Normal size of the pulmonary artery.  IMPRESSION: 1. Coronary calcium score of 199. This was 14 percentile for age and sex matched control. In 2018 it was calcium score of 56 (79 percentile for age and sex matched control).  2. Normal coronary origin with right dominance.  3. CAD-RADS 3. Moderate stenosis in the proximal and distal RCA, mild stenosis in the proximal LAD. Consider symptom-guided anti-ischemic pharmacotherapy as well as risk factor modification per guideline directed care. Additional analysis with CT FFR will be submitted. There is progression of disease since the prior CCTA in 2018.  FINDINGS: FFRct analysis was performed on the original cardiac CT angiogram dataset. Diagrammatic representation of the FFRct analysis is provided in a separate PDF document in PACS. This dictation was created using the PDF document and an interactive 3D model of the results. 3D model is not available in the EMR/PACS. Normal FFR range is >0.80.  1. Left Main: 0.99.  2. LAD: Proximal: 0.98, mid: 0.94, distal: 0.90. 3. D1: 0.96. 4. LCX: 0.99. 5. OM1: 0.98 6. RCA: Proximal: 0.99, mid:  0.94, distal: 0.88.  IMPRESSION: 1. CT FFR analysis didn't show any significant  stenosis. Intensify medical management to slow down disease progression.   Electronically Signed   By: Ena Dawley   On: 08/26/2020 22:37  Monitor 07/2020  Patient had a min HR of 42 bpm, max HR of 176 bpm, and avg HR of 75 bpm  11 very short Supraventricular Tachycardia runs occurred, the run with the fastest interval lasting 4 beats with a max rate of 176 bpm, the longest lasting 10 beats with an avg rate of 114 bpm.  Rare PACs and PVCs.   Predominant underlying rhythm was Sinus Rhythm. 11 very short Supraventricular Tachycardia runs occurred. the run with the fastest interval lasting 4 beats with a max rate of 176 bpm, the  longest lasting 10 beats with an avg rate of 114 bpm.   2D echo 06/2020   1. Left ventricular ejection fraction, by estimation, is 60 to 65%. The  left ventricle has normal function. The left ventricle has no regional  wall motion abnormalities. There is mild concentric left ventricular  hypertrophy. Left ventricular diastolic  parameters are consistent with Grade I diastolic dysfunction (impaired  relaxation).  2. Right ventricular systolic function is normal. The right ventricular  size is normal.  3. The mitral valve is normal in structure. No evidence of mitral valve  regurgitation. No evidence of mitral stenosis.  4. The aortic valve is tricuspid. Aortic valve regurgitation is mild to  moderate. No aortic stenosis is present.  5. Aortic dilatation noted. There is mild dilatation of the ascending  aorta, measuring 38 mm.  6. The inferior vena cava is normal in size with greater than 50%  respiratory variability, suggesting right atrial pressure of 3 mmHg.   Comparison(s): No significant change from prior study. Prior images  reviewed side by side.   FINDINGS      EKG:  EKG is not ordered today  Recent Labs: 03/02/2020: Magnesium 2.0 05/10/2020: Hemoglobin 11.9; Platelets 234.0 11/29/2020: ALT 30; BUN 23; Creatinine, Ser 0.99;  Potassium 3.7; Sodium 141; TSH 2.16  Recent Lipid Panel    Component Value Date/Time   CHOL 148 10/25/2020 1005   TRIG 153 (H) 10/25/2020 1005   TRIG 181 (H) 07/23/2006 1101   HDL 63 10/25/2020 1005   CHOLHDL 2.3 10/25/2020 1005   CHOLHDL 2 05/03/2018 0913   VLDL 19.0 05/03/2018 0913   LDLCALC 59 10/25/2020 1005   LDLDIRECT 114.0 05/02/2016 0919    PHYSICAL EXAM:    VS:  BP 130/80   Pulse 72   Ht 5' 2"  (1.575 m)   Wt 153 lb (69.4 kg)   SpO2 98%   BMI 27.98 kg/m   BMI: Body mass index is 27.98 kg/m.  GEN: Well nourished, well developed female in no acute distress HEENT: normocephalic, atraumatic Neck: no JVD, carotid bruits, or masses Cardiac: RRR; no murmurs, rubs, or gallops, no edema  Respiratory:  clear to auscultation bilaterally, normal work of breathing GI: soft, nontender, nondistended, + BS MS: no deformity or atrophy Skin: warm and dry, no rash Neuro:  Alert and Oriented x 3, Strength and sensation are intact, follows commands Psych: euthymic mood, full affect  Wt Readings from Last 3 Encounters:  12/06/20 153 lb (69.4 kg)  11/29/20 155 lb (70.3 kg)  11/24/20 156 lb (70.8 kg)     ASSESSMENT & PLAN:   1. Moderate CAD also with hyperlipidemia goal LDL <70 - she is not having any angina-like symptoms.  Continue risk factor modification. She had questions about the different recommendations on statin dose. Discussed rationale of 14m dosing given generalized prevention of progression of CAD. Since she tolerated the daily 575mdose well, she is willing to trial the 2062maily dose. If she is unable to tolerate this we can reduce the dose. Fortunately her LDL was at goal by last check on the 5mg79mily dose. Will recheck CMET/lipid profile in 3 months.   2. Brief PSVT by recent monitor - patient denies any acute symptoms related to this. Would continue to monitor for now. Flecainide was previously suggested. Given her CAD, alternative options may be more suitable. Can  consider EP referral if symptoms develop.  3. Essential HTN - BP top-normal today. The patient was instructed to monitor their blood pressure at home and to call if tending to run higher than 130/80. Continue low dose amlodipine.  4. Mild-moderate AI by echo 06/2020 - recheck echo 06/2021.  5. Mild dilation of ascending aorta - recheck echo 06/2021. Continue BP management as above.  6. Leg cramps - She also reports sporadic leg cramps. We will recheck K and Mg today - would aim for goal K 4.0 or greater and Mg 2.0 or greater (recent K 3.7, which is less than it usually is). No classic claudication sx and she has good pulses. She will clarify her magnesium supplement form when we call her with results.  Disposition: Patient requests ongoing f/u with me since Dr. NelsMeda Coffee relocated. We can also offer her to establish with Dr. PembJohney Frameshe desires as well.  Medication Adjustments/Labs and Tests Ordered: Current medicines are reviewed at length with the patient today.  Concerns regarding medicines are outlined above. Medication changes, Labs and Tests ordered today are summarized above and listed in the Patient Instructions accessible in Encounters.   Signed, DaynCharlie Pitter-C  12/06/2020 9:08 AM    [address]  [id number]: [phone]: [phone]

## 2020-12-06 ENCOUNTER — Ambulatory Visit: Payer: Medicare Other

## 2020-12-06 ENCOUNTER — Other Ambulatory Visit: Payer: Self-pay

## 2020-12-06 ENCOUNTER — Telehealth: Payer: Self-pay | Admitting: Internal Medicine

## 2020-12-06 ENCOUNTER — Other Ambulatory Visit (HOSPITAL_BASED_OUTPATIENT_CLINIC_OR_DEPARTMENT_OTHER): Payer: Self-pay

## 2020-12-06 ENCOUNTER — Encounter: Payer: Self-pay | Admitting: Physician Assistant

## 2020-12-06 ENCOUNTER — Ambulatory Visit (INDEPENDENT_AMBULATORY_CARE_PROVIDER_SITE_OTHER): Payer: Medicare Other | Admitting: Physician Assistant

## 2020-12-06 VITALS — BP 130/80 | HR 72 | Ht 62.0 in | Wt 153.0 lb

## 2020-12-06 DIAGNOSIS — I2510 Atherosclerotic heart disease of native coronary artery without angina pectoris: Secondary | ICD-10-CM | POA: Diagnosis not present

## 2020-12-06 DIAGNOSIS — I471 Supraventricular tachycardia, unspecified: Secondary | ICD-10-CM

## 2020-12-06 DIAGNOSIS — I77810 Thoracic aortic ectasia: Secondary | ICD-10-CM

## 2020-12-06 DIAGNOSIS — R252 Cramp and spasm: Secondary | ICD-10-CM | POA: Diagnosis not present

## 2020-12-06 DIAGNOSIS — E785 Hyperlipidemia, unspecified: Secondary | ICD-10-CM | POA: Diagnosis not present

## 2020-12-06 DIAGNOSIS — I10 Essential (primary) hypertension: Secondary | ICD-10-CM | POA: Diagnosis not present

## 2020-12-06 LAB — POTASSIUM: Potassium: 4.1 mmol/L (ref 3.5–5.2)

## 2020-12-06 LAB — MAGNESIUM: Magnesium: 2 mg/dL (ref 1.6–2.3)

## 2020-12-06 MED ORDER — ROSUVASTATIN CALCIUM 20 MG PO TABS
20.0000 mg | ORAL_TABLET | Freq: Every day | ORAL | 3 refills | Status: DC
  Filled 2020-12-06: qty 90, 90d supply, fill #0
  Filled 2021-03-21: qty 90, 90d supply, fill #1

## 2020-12-06 NOTE — Patient Instructions (Addendum)
Medication Instructions:  Your physician has recommended you make the following change in your medication:  1.  INCREASE the Crestor to 20 mg taking 1 tablet daily   If you need a refill on your cardiac medications before your next appointment, please call your pharmacy*   Lab Work: TODAY:  MAGNESIUM & POTASSIUM  3 MONTHS:  FASTING LIPID & CMET  If you have labs (blood work) drawn today and your tests are completely normal, you will receive your results only by: Marland Kitchen MyChart Message (if you have MyChart) OR . A paper copy in the mail If you have any lab test that is abnormal or we need to change your treatment, we will call you to review the results.   Testing/Procedures: Your physician has requested that you have an echocardiogram in December, 2022.  Echocardiography is a painless test that uses sound waves to create images of your heart. It provides your doctor with information about the size and shape of your heart and how well your heart's chambers and valves are working. This procedure takes approximately one hour. There are no restrictions for this procedure.     Follow-Up: At [facility], you and your health needs are our priority.  As part of our continuing mission to provide you with exceptional heart care, we have created designated Provider Care Teams.  These Care Teams include your primary Cardiologist (physician) and Advanced Practice Providers (APPs -  Physician Assistants and Nurse Practitioners) who all work together to provide you with the care you need, when you need it.  We recommend signing up for the patient portal called "MyChart".  Sign up information is provided on this After Visit Summary.  MyChart is used to connect with patients for Virtual Visits (Telemedicine).  Patients are able to view lab/test results, encounter notes, upcoming appointments, etc.  Non-urgent messages can be sent to your provider as well.   To learn more about what you can do with MyChart,  go to [email or web address].    Your next appointment:   12 month(s)  The format for your next appointment:   In Person  Provider:   Gwyndolyn Kaufman, MD (Dr. Meda Coffee transfer) or Melina Copa, PA-C   Other Instructions  I would recommend using a blood pressure cuff that goes on your arm. The wrist ones can be inaccurate. If possible, try to select one that also reports your heart rate. To check your blood pressure, choose a time at least 3 hours after taking your blood pressure medicines. If you can sample it at different times of the day, that's great - it might give you more information about how your blood pressure fluctuates. Remain seated in a chair for 5 minutes quietly beforehand, then check it. Please monitor your blood pressure occasionally at home. Call us if you tend to get readings of greater than 130 on the top number or 80 on the bottom number.

## 2020-12-06 NOTE — Telephone Encounter (Signed)
LVM for pt to rtn my call to r/s appt with N[facility]. Please r/s appt if pt calls the office.

## 2020-12-07 ENCOUNTER — Telehealth: Payer: Self-pay | Admitting: Physician Assistant

## 2020-12-07 NOTE — Telephone Encounter (Signed)
Patient is returning call to discuss lab results. 

## 2020-12-07 NOTE — Telephone Encounter (Signed)
-----   Message from Charlie Pitter, [place] sent at 12/06/2020  4:41 PM EDT ----- Potassium and magnesium are normal. Therefore whatever supplement she is currently taking from magnesium supplement is working for her - she was going to clarify the type of magnesium (oxide, glycinate, etc) when we called so please update MAR. Thanks!

## 2020-12-07 NOTE — Telephone Encounter (Signed)
Returned the call to pt and she has been made aware of her lab results.  She confirms she is taking MagOx 400 1/2 tablet daily.  Med rec has been changed to reflect.

## 2020-12-07 NOTE — Progress Notes (Signed)
Pt has been made aware of normal result and verbalized understanding.  jw

## 2021-01-05 NOTE — Progress Notes (Signed)
[place] Phone: [phone] Subjective:   [name], am serving as a scribe for Dr. Hulan Saas. This visit occurred during the SARS-CoV-2 public health emergency.  Safety protocols were in place, including screening questions prior to the visit, additional usage of staff PPE, and extensive cleaning of exam room while observing appropriate contact time as indicated for disinfecting solutions.    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  Discussed about potential custom bracing, home exercises and icing regimen.  Patient declined do anything more aggressive at this time but will continue to follow-up with me.  Patient has had a Baker's cyst previously.  Patient's knee does seems to be large and somewhat swollen diffusely.  We discussed that if this does not work we will consider the possibility of aspiration of the Baker's cyst.  Has been 3 years since then.  Patient usually does respond well though to home exercises, icing regimen.  Patient declined any type of bracing at the moment.  Follow-up again in 6 weeks.  Could be a candidate for possible viscosupplementation   Update 01/06/2021 [name] is a 75 y.o. female coming in with complaint of B knee, R >L and R ankle pain. Patient states that injection did help alleviate her pain. Pain is returning. Feels that arthritis is affecting all of her joints.   Pain in R ankle that causes a giving away sensation especially when going down stairs.    Patient initially did have an MRI of the right ankle showing a subacute nondisplaced fracture of the lateral malleolus.  Patient had also what appeared to be a small split tear of the peroneus brevis and questionable findings consistent with sinus tarsi  syndrome.  Right knee was injected November 24, 2020 with    Past Medical History:  Diagnosis Date   Allergy    seasonal   Ankylosing spondylitis (HCC)    Anxiety    Arthritis    Bell palsy    CAD (coronary artery disease)    a. Coronary CT 10/2016 showed calcium score in the 59th perecntile, moderate CAD in RCA/PDA and narrowing of the mid to distal LAD, with negative FFR.   CTS (carpal tunnel syndrome)    Cystitis    Diabetes mellitus without complication ([place])    Diverticulitis    Diverticulosis of colon    Dry eye syndrome    Exocrine pancreatic insufficiency    Frozen shoulder    GERD (gastroesophageal reflux disease)    HLA B27 (HLA B27 positive)    Hx of colonic polyps    Hyperlipidemia    NMR 2005   Hypertension    Hypothyroidism    Incontinence    Internal hemorrhoids    Interstitial cystitis    Kidney stone    Menopause    Mild aortic insufficiency    Mild ascending aorta dilatation (HCC)    Osteoarthritis    Osteopenia    [facility] [address]   Pre-diabetes    PSVT (paroxysmal supraventricular tachycardia) ([place])    S/P total hysterectomy and bilateral salpingo-oophorectomy 04/12/2014   Sinusitis    Sleep apnea    c-pap   Subjective visual disturbance of both eyes 12/30/2013   Tubular adenoma of  colon    Vitamin D deficiency    Xerostomia    and Xeroophthalmia   Past Surgical History:  Procedure Laterality Date   BLADDER SURGERY     for incontinence   CARPAL TUNNEL RELEASE Left 12/22/2014   Procedure: LEFT CARPAL TUNNEL RELEASE;  Surgeon: Daryll Brod, MD;  Location: [place];  Service: Orthopedics;  Laterality: Left;   CARPAL TUNNEL RELEASE Right 02/18/2019   Procedure: RIGHT CARPAL TUNNEL RELEASE;  Surgeon: Daryll Brod, MD;  Location: [place];  Service: Orthopedics;  Laterality: Right;   COLONOSCOPY W/ POLYPECTOMY     adenomatous poyp 04-2008   colonscopy     tics 10-2002   CYSTOSCOPY WITH RETROGRADE  PYELOGRAM, URETEROSCOPY AND STENT PLACEMENT Left 09/01/2013   Procedure: CYSTOSCOPY WITH RETROGRADE PYELOGRAM, AND LEFT STENT PLACEMENT;  Surgeon: Molli Hazard, MD;  Location: [facility];  Service: Urology;  Laterality: Left;   elevated LFTs     due to Elmiron   FINGER SURGERY Left 2004   2nd finger   G 1 P 1     interstitial cystitis     with clot in catheter   PARTIAL HYSTERECTOMY     with one ovary left   SHOULDER ARTHROSCOPY Left    TOTAL ABDOMINAL HYSTERECTOMY W/ BILATERAL SALPINGOOPHORECTOMY     dysfunctional menses   Social History   Socioeconomic History   Marital status: Married    Spouse name: Not on file   Number of children: 1   Years of education: BS   Highest education level: Not on file  Occupational History   Occupation: retired   Occupation: Pharmacist, hospital    Comment: Kindergarten  Tobacco Use   Smoking status: Never   Smokeless tobacco: Never  Vaping Use   Vaping Use: Never used  Substance and Sexual Activity   Alcohol use: Yes    Comment: social   Drug use: No   Sexual activity: Not Currently  Other Topics Concern   Not on file  Social History Narrative   Lives at home with husband.   No daily use of caffeine.   Right-handed.         Social Determinants of Health   Financial Resource Strain: Not on file  Food Insecurity: Not on file  Transportation Needs: Not on file  Physical Activity: Not on file  Stress: Not on file  Social Connections: Not on file   Allergies  Allergen Reactions   Bactrim [Sulfamethoxazole-Trimethoprim] Other (See Comments)   Nitroglycerin Anaphylaxis    Hypotensive after NTG administration in ER during chest pain evaluation Pt states she almost died,  Pt states in a procedure done in 10/2016 she had no reaction to this medication, so not sure if it is an actual allergy.    Pentosan Polysulfate Other (See Comments)    Elevated liver count Elevated liver enzymes Elevated LFTs.......Marland Kitchen elmiron    Pentosan Polysulfate  Sodium     Elevated LFTs.......Marland Kitchen elmiron    Promethazine Hcl Other (See Comments)    "jittery on the inside"   Sulfamethoxazole-Trimethoprim Nausea Only    Unknown reaction per pt, Pt thinks she may have felt sick to her stomach.    Losartan Other (See Comments)    Pt states caused insomnia, tingling sensation in legs, lightheaded, stomach pain   Promethazine Hcl    Lisinopril Cough   Family History  Problem Relation Age of Onset   Coronary artery disease Mother    Hypertension Mother  Asthma Mother    Dementia Mother    Stroke Mother        mini cva   Diabetes Father    Cirrhosis Father        non alcoholic   Colon cancer Sister 66   Allergic rhinitis Sister    Allergic rhinitis Sister    Food Allergy Sister    Renal cancer Paternal Aunt        x 2   Coronary artery disease Maternal Grandfather    Breast cancer Maternal Aunt    Diabetes Paternal Aunt    Diabetes Paternal Grandmother    Esophageal cancer Neg Hx    Stomach cancer Neg Hx      Current Outpatient Medications (Cardiovascular):    amLODipine (NORVASC) 2.5 MG tablet, Take 1 tablet (2.5 mg total) by mouth daily.   EPINEPHrine (EPIPEN 2-PAK) 0.3 mg/0.3 mL IJ SOAJ injection, Use as directed for severe allergic reactions   rosuvastatin (CRESTOR) 20 MG tablet, Take 1 tablet (20 mg total) by mouth daily.  Current Outpatient Medications (Respiratory):    azelastine (ASTELIN) 0.1 % nasal spray, One to two sprays each nostril twice a day as needed.   cetirizine (ZYRTEC) 10 MG tablet, Take 10 mg by mouth daily.  Current Outpatient Medications (Analgesics):    acetaminophen (TYLENOL) 500 MG tablet, Take 1,000 mg by mouth 3 (three) times daily as needed.    aspirin 81 MG tablet, Take 81 mg by mouth daily.   HYDROcodone-acetaminophen (NORCO/VICODIN) 5-325 MG tablet, hydrocodone 5 mg-acetaminophen 325 mg tablet  TAKE 1 TABLET BY MOUTH THREE TIMES DAILY AS NEEDED FOR 7 DAYS  Current Outpatient Medications  (Hematological):    vitamin B-12 (CYANOCOBALAMIN) 1000 MCG tablet, Take 1,000 mcg by mouth 2 (two) times daily.   Current Outpatient Medications (Other):    Ascorbic Acid (VITAMIN C) 1000 MG tablet, Take 1,000 mg by mouth 2 (two) times daily.    Cholecalciferol (VITAMIN D-3) 25 MCG (1000 UT) CAPS, Take 10,000 Units by mouth daily.    diclofenac Sodium (VOLTAREN) 1 % GEL, Apply topically 4 (four) times daily as needed.    dicyclomine (BENTYL) 10 MG capsule, Take 1 capsule (10 mg total) by mouth 3 (three) times daily as needed for spasms.   diphenoxylate-atropine (LOMOTIL) 2.5-0.025 MG tablet, TAKE 1 TABLET BY MOUTH 4 TIMES DAILY AS NEEDED FOR DIARRHEA OR LOOSE STOOLS   estradiol (ESTRACE) 0.1 MG/GM vaginal cream, estradiol 0.01% (0.1 mg/gram) vaginal cream   lipase/protease/amylase (CREON) 36000 UNITS CPEP capsule, Take 2 capsules by mouth with each meal, 1 capsule by mouth with each snack   magnesium oxide (MAG-OX) 400 MG tablet, Take 200 mg by mouth daily.   Multiple Vitamins-Minerals (CENTRUM SILVER 50+WOMEN PO), Take 1 tablet by mouth daily.   mupirocin ointment (BACTROBAN) 2 %, APPLY 1 APPLICATION ON TO THE SKIN 2 TIMES DAILY   naloxone (NARCAN) 4 MG/0.1ML LIQD nasal spray kit, Narcan 4 mg/actuation nasal spray  PLACE 1 SPRAY INTO EACH NOSTRIL FOR SUSPECTED OPIOID OVERDOSE. MAY REPEAT 2 - 3 MINUTES WITH NEW BOTTLE IN OTHER NOSTRIL   olopatadine (PATANOL) 0.1 % ophthalmic solution, Place 1 drop into both eyes 2 (two) times daily.   Omega-3 Fatty Acids (FISH OIL BURP-LESS) 1000 MG CAPS, Takes 1000 mg BID   omeprazole (PRILOSEC) 40 MG capsule, TAKE 1 CAPSULE BY MOUTH ONCE DAILY   ondansetron (ZOFRAN) 4 MG tablet, Take 1 tablet (4 mg total) by mouth every 8 (eight) hours as needed.   sertraline (  ZOLOFT) 25 MG tablet, TAKE 1 TABLET (25 MG TOTAL) BY MOUTH DAILY.   vitamin E 400 UNIT capsule, Take 400 Units by mouth daily.   zinc gluconate 50 MG tablet, Take 50 mg by mouth daily.   Reviewed  prior external information including notes and imaging from  primary care provider As well as notes that were available from care everywhere and other healthcare systems.  Past medical history, social, surgical and family history all reviewed in electronic medical record.  No pertanent information unless stated regarding to the chief complaint.   Review of Systems:  No headache, visual changes, nausea, vomiting, diarrhea, constipation, dizziness, abdominal pain, skin rash, fevers, chills, night sweats, weight loss, swollen lymph nodes, body aches, joint swelling, chest pain, shortness of breath, mood changes. POSITIVE muscle aches  Objective  Blood pressure 122/82, pulse 69, height 5' 2"  (1.575 m), weight 156 lb (70.8 kg), SpO2 98 %.   General: No apparent distress alert and oriented x3 mood and affect normal, dressed appropriately.  HEENT: Pupils equal, extraocular movements intact  Respiratory: Patient's speak in full sentences and does not appear short of breath  Cardiovascular: No lower extremity edema, non tender, no erythema  Gait very minorly antalgic. Patient noted does have tenderness to the knees bilaterally of the medial compartments.  Mild arthritic changes noted.  Neurovascularly intact distally.  Patient right ankle and seems to have no pain over the lateral malleolus but still has some pain over the peroneal tendon area.  Limited musculoskeletal ultrasound was performed and interpreted by  Lyndal Pulley  Limited musculoskeletal ultrasound Shows patient does have what appears to beContinued splt tear of the peroneal tendon.  Patient's cortical area of the lateral malleolus appears to be unremarkable. Impression: Continued peroneal tendon    Impression and Recommendations:     The above documentation has been reviewed and is accurate and complete Lyndal Pulley, DO

## 2021-01-06 ENCOUNTER — Encounter: Payer: Self-pay | Admitting: Family Medicine

## 2021-01-06 ENCOUNTER — Ambulatory Visit (INDEPENDENT_AMBULATORY_CARE_PROVIDER_SITE_OTHER): Payer: Medicare Other

## 2021-01-06 ENCOUNTER — Ambulatory Visit: Payer: Self-pay

## 2021-01-06 ENCOUNTER — Other Ambulatory Visit: Payer: Self-pay

## 2021-01-06 ENCOUNTER — Ambulatory Visit (INDEPENDENT_AMBULATORY_CARE_PROVIDER_SITE_OTHER): Payer: Medicare Other | Admitting: Family Medicine

## 2021-01-06 VITALS — BP 122/82 | HR 69 | Ht 62.0 in | Wt 156.0 lb

## 2021-01-06 DIAGNOSIS — M25561 Pain in right knee: Secondary | ICD-10-CM | POA: Diagnosis not present

## 2021-01-06 DIAGNOSIS — M25562 Pain in left knee: Secondary | ICD-10-CM

## 2021-01-06 DIAGNOSIS — I2510 Atherosclerotic heart disease of native coronary artery without angina pectoris: Secondary | ICD-10-CM

## 2021-01-06 DIAGNOSIS — G8929 Other chronic pain: Secondary | ICD-10-CM

## 2021-01-06 DIAGNOSIS — M25471 Effusion, right ankle: Secondary | ICD-10-CM

## 2021-01-06 DIAGNOSIS — M170 Bilateral primary osteoarthritis of knee: Secondary | ICD-10-CM

## 2021-01-06 NOTE — Patient Instructions (Addendum)
Xray today Aircast  w activity  Hold on injection See me in 6 weeks and consider knee injections

## 2021-01-06 NOTE — Assessment & Plan Note (Signed)
Degenerative knees bilaterally.  Hold on the injection on the left side.  We will consider at follow-up doing injections bilaterally.  Could be a candidate for viscosupplementation in the long run.  Follow-up with me again in 6 weeks

## 2021-01-06 NOTE — Assessment & Plan Note (Signed)
Likely patient does not have an effusion of the ankle anymore.  Patient given an Aircast that I think will be beneficial.  Does have some peroneal tendon irritation still noted with a potential split tear still noted.  Patient would not want any surgical intervention.  Discussed with heel lift still.  Follow-up with me again in 6 weeks

## 2021-01-11 ENCOUNTER — Telehealth: Payer: Self-pay | Admitting: Internal Medicine

## 2021-01-11 DIAGNOSIS — M25559 Pain in unspecified hip: Secondary | ICD-10-CM | POA: Diagnosis not present

## 2021-01-11 DIAGNOSIS — M159 Polyosteoarthritis, unspecified: Secondary | ICD-10-CM | POA: Diagnosis not present

## 2021-01-11 DIAGNOSIS — M170 Bilateral primary osteoarthritis of knee: Secondary | ICD-10-CM | POA: Diagnosis not present

## 2021-01-11 DIAGNOSIS — M810 Age-related osteoporosis without current pathological fracture: Secondary | ICD-10-CM | POA: Diagnosis not present

## 2021-01-11 DIAGNOSIS — M79643 Pain in unspecified hand: Secondary | ICD-10-CM | POA: Diagnosis not present

## 2021-01-11 DIAGNOSIS — M25519 Pain in unspecified shoulder: Secondary | ICD-10-CM | POA: Diagnosis not present

## 2021-01-11 DIAGNOSIS — M48061 Spinal stenosis, lumbar region without neurogenic claudication: Secondary | ICD-10-CM | POA: Diagnosis not present

## 2021-01-11 DIAGNOSIS — G894 Chronic pain syndrome: Secondary | ICD-10-CM | POA: Diagnosis not present

## 2021-01-11 DIAGNOSIS — M461 Sacroiliitis, not elsewhere classified: Secondary | ICD-10-CM | POA: Diagnosis not present

## 2021-01-11 DIAGNOSIS — M79673 Pain in unspecified foot: Secondary | ICD-10-CM | POA: Diagnosis not present

## 2021-01-11 NOTE — Telephone Encounter (Signed)
LVM for pt to rtn my call to schedule AWV with [facility]. Please schedule this appt if pt calls the office.  °

## 2021-01-24 DIAGNOSIS — M818 Other osteoporosis without current pathological fracture: Secondary | ICD-10-CM | POA: Diagnosis not present

## 2021-01-24 DIAGNOSIS — M8589 Other specified disorders of bone density and structure, multiple sites: Secondary | ICD-10-CM | POA: Diagnosis not present

## 2021-01-28 ENCOUNTER — Ambulatory Visit: Payer: Medicare Other

## 2021-02-02 ENCOUNTER — Ambulatory Visit: Payer: Self-pay

## 2021-02-02 ENCOUNTER — Other Ambulatory Visit (HOSPITAL_BASED_OUTPATIENT_CLINIC_OR_DEPARTMENT_OTHER): Payer: Self-pay

## 2021-02-02 ENCOUNTER — Encounter: Payer: Self-pay | Admitting: Family Medicine

## 2021-02-02 ENCOUNTER — Ambulatory Visit (INDEPENDENT_AMBULATORY_CARE_PROVIDER_SITE_OTHER): Payer: Medicare Other | Admitting: Family Medicine

## 2021-02-02 ENCOUNTER — Ambulatory Visit (INDEPENDENT_AMBULATORY_CARE_PROVIDER_SITE_OTHER): Payer: Medicare Other

## 2021-02-02 ENCOUNTER — Other Ambulatory Visit: Payer: Self-pay

## 2021-02-02 VITALS — BP 120/80 | HR 74 | Ht 62.0 in | Wt 152.0 lb

## 2021-02-02 DIAGNOSIS — M25572 Pain in left ankle and joints of left foot: Secondary | ICD-10-CM

## 2021-02-02 DIAGNOSIS — Z20822 Contact with and (suspected) exposure to covid-19: Secondary | ICD-10-CM | POA: Diagnosis not present

## 2021-02-02 DIAGNOSIS — G8929 Other chronic pain: Secondary | ICD-10-CM

## 2021-02-02 DIAGNOSIS — M79672 Pain in left foot: Secondary | ICD-10-CM

## 2021-02-02 DIAGNOSIS — M19072 Primary osteoarthritis, left ankle and foot: Secondary | ICD-10-CM | POA: Diagnosis not present

## 2021-02-02 DIAGNOSIS — I2510 Atherosclerotic heart disease of native coronary artery without angina pectoris: Secondary | ICD-10-CM

## 2021-02-02 MED ORDER — PREDNISONE 20 MG PO TABS
20.0000 mg | ORAL_TABLET | Freq: Every day | ORAL | 0 refills | Status: DC
  Filled 2021-02-02: qty 7, 7d supply, fill #0

## 2021-02-02 NOTE — Progress Notes (Signed)
[place] [address] [place] Phone: [phone] Subjective:   I [name] am serving as a Education administrator for Dr. Hulan Saas.  This visit occurred during the SARS-CoV-2 public health emergency.  Safety protocols were in place, including screening questions prior to the visit, additional usage of staff PPE, and extensive cleaning of exam room while observing appropriate contact time as indicated for disinfecting solutions.   I'm seeing this patient by the request  of:  Binnie Rail, MD  CC: Left ankle pain  [id number]  04/05/2021 Likely patient does not have an effusion of the ankle anymore.  Patient given an Aircast that I think will be beneficial.  Does have some peroneal tendon irritation still noted with a potential split tear still noted.  Patient would not want any surgical intervention.  Discussed with heel lift still.  Follow-up with me again in 6 weeks  Degenerative knees bilaterally.  Hold on the injection on the left side.  We will consider at follow-up doing injections bilaterally.  Could be a candidate for viscosupplementation in the long run.  Follow-up with me again in 6 weeks  02/02/2021 [name] is a 75 y.o. female coming in with complaint of left ankle pain. Patient having pain over ATF and into the ankle mortise since yesterday. Using Aircast for support. After sitting ankle will become stiff.    Points more to the dorsal aspect of the foot.  States that certain activities have become difficult such as going up stairs.  Patient is concerned because she will be the primary caregiver for her daughter who is undergoing a liver resection.  States there has been some mild swelling.     Past Medical History:  Diagnosis Date   Allergy    seasonal   Ankylosing spondylitis (HCC)    Anxiety    Arthritis    Bell palsy    CAD (coronary artery disease)    a. Coronary CT 10/2016 showed calcium score in the 59th  perecntile, moderate CAD in RCA/PDA and narrowing of the mid to distal LAD, with negative FFR.   CTS (carpal tunnel syndrome)    Cystitis    Diabetes mellitus without complication ([place])    Diverticulitis    Diverticulosis of colon    Dry eye syndrome    Exocrine pancreatic insufficiency    Frozen shoulder    GERD (gastroesophageal reflux disease)    HLA B27 (HLA B27 positive)    Hx of colonic polyps    Hyperlipidemia    NMR 2005   Hypertension    Hypothyroidism    Incontinence    Internal hemorrhoids    Interstitial cystitis    Kidney stone    Menopause    Mild aortic insufficiency    Mild ascending aorta dilatation (HCC)    Osteoarthritis    Osteopenia    [facility] [address]   Pre-diabetes    PSVT (paroxysmal supraventricular tachycardia) ([place])    S/P total hysterectomy and bilateral salpingo-oophorectomy 04/12/2014   Sinusitis    Sleep apnea    c-pap   Subjective visual disturbance of both eyes 12/30/2013   Tubular adenoma of colon    Vitamin D deficiency    Xerostomia    and Xeroophthalmia   Past Surgical History:  Procedure Laterality Date   BLADDER SURGERY     for incontinence   CARPAL TUNNEL RELEASE Left 12/22/2014   Procedure: LEFT CARPAL TUNNEL RELEASE;  Surgeon: Daryll Brod, MD;  Location: [place] [facility];  Service: Orthopedics;  Laterality: Left;   CARPAL TUNNEL RELEASE Right 02/18/2019   Procedure: RIGHT CARPAL TUNNEL RELEASE;  Surgeon: Daryll Brod, MD;  Location: [place];  Service: Orthopedics;  Laterality: Right;   COLONOSCOPY W/ POLYPECTOMY     adenomatous poyp 04-2008   colonscopy     tics 10-2002   CYSTOSCOPY WITH RETROGRADE PYELOGRAM, URETEROSCOPY AND STENT PLACEMENT Left 09/01/2013   Procedure: CYSTOSCOPY WITH RETROGRADE PYELOGRAM, AND LEFT STENT PLACEMENT;  Surgeon: Molli Hazard, MD;  [facility];  Service: Urology;  Laterality: Left;   elevated LFTs     due to Elmiron   FINGER SURGERY Left 2004    2nd finger   G 1 P 1     interstitial cystitis     with clot in catheter   PARTIAL HYSTERECTOMY     with one ovary left   SHOULDER ARTHROSCOPY Left    TOTAL ABDOMINAL HYSTERECTOMY W/ BILATERAL SALPINGOOPHORECTOMY     dysfunctional menses   Social History   Socioeconomic History   Marital status: Married    Spouse name: Not on file   Number of children: 1   Years of education: BS   Highest education level: Not on file  Occupational History   Occupation: retired   Occupation: Pharmacist, hospital    Comment: Kindergarten  Tobacco Use   Smoking status: Never   Smokeless tobacco: Never  Vaping Use   Vaping Use: Never used  Substance and Sexual Activity   Alcohol use: Yes    Comment: social   Drug use: No   Sexual activity: Not Currently  Other Topics Concern   Not on file  Social History Narrative   Lives at home with husband.   No daily use of caffeine.   Right-handed.         Social Determinants of Health   Financial Resource Strain: Not on file  Food Insecurity: Not on file  Transportation Needs: Not on file  Physical Activity: Not on file  Stress: Not on file  Social Connections: Not on file   Allergies  Allergen Reactions   Bactrim [Sulfamethoxazole-Trimethoprim] Other (See Comments)   Nitroglycerin Anaphylaxis    Hypotensive after NTG administration in ER during chest pain evaluation Pt states she almost died,  Pt states in a procedure done in 10/2016 she had no reaction to this medication, so not sure if it is an actual allergy.    Pentosan Polysulfate Other (See Comments)    Elevated liver count Elevated liver enzymes Elevated LFTs.......Marland Kitchen elmiron    Pentosan Polysulfate Sodium     Elevated LFTs.......Marland Kitchen elmiron    Promethazine Hcl Other (See Comments)    "jittery on the inside"   Sulfamethoxazole-Trimethoprim Nausea Only    Unknown reaction per pt, Pt thinks she may have felt sick to her stomach.    Losartan Other (See Comments)    Pt states caused  insomnia, tingling sensation in legs, lightheaded, stomach pain   Promethazine Hcl    Lisinopril Cough   Family History  Problem Relation Age of Onset   Coronary artery disease Mother    Hypertension Mother    Asthma Mother    Dementia Mother    Stroke Mother        mini cva   Diabetes Father    Cirrhosis Father        non alcoholic   Colon cancer Sister 56   Allergic rhinitis Sister  Allergic rhinitis Sister    Food Allergy Sister    Renal cancer Paternal Aunt        x 2   Coronary artery disease Maternal Grandfather    Breast cancer Maternal Aunt    Diabetes Paternal Aunt    Diabetes Paternal Grandmother    Esophageal cancer Neg Hx    Stomach cancer Neg Hx     Current Outpatient Medications (Endocrine & Metabolic):    predniSONE (DELTASONE) 20 MG tablet, Take 1 tablet (20 mg total) by mouth daily.  Current Outpatient Medications (Cardiovascular):    amLODipine (NORVASC) 2.5 MG tablet, Take 1 tablet (2.5 mg total) by mouth daily.   EPINEPHrine (EPIPEN 2-PAK) 0.3 mg/0.3 mL IJ SOAJ injection, Use as directed for severe allergic reactions   rosuvastatin (CRESTOR) 20 MG tablet, Take 1 tablet (20 mg total) by mouth daily.  Current Outpatient Medications (Respiratory):    azelastine (ASTELIN) 0.1 % nasal spray, One to two sprays each nostril twice a day as needed.   cetirizine (ZYRTEC) 10 MG tablet, Take 10 mg by mouth daily.  Current Outpatient Medications (Analgesics):    acetaminophen (TYLENOL) 500 MG tablet, Take 1,000 mg by mouth 3 (three) times daily as needed.    aspirin 81 MG tablet, Take 81 mg by mouth daily.   HYDROcodone-acetaminophen (NORCO/VICODIN) 5-325 MG tablet, hydrocodone 5 mg-acetaminophen 325 mg tablet  TAKE 1 TABLET BY MOUTH THREE TIMES DAILY AS NEEDED FOR 7 DAYS  Current Outpatient Medications (Hematological):    vitamin B-12 (CYANOCOBALAMIN) 1000 MCG tablet, Take 1,000 mcg by mouth 2 (two) times daily.   Current Outpatient Medications (Other):     Ascorbic Acid (VITAMIN C) 1000 MG tablet, Take 1,000 mg by mouth 2 (two) times daily.    Cholecalciferol (VITAMIN D-3) 25 MCG (1000 UT) CAPS, Take 10,000 Units by mouth daily.    diclofenac Sodium (VOLTAREN) 1 % GEL, Apply topically 4 (four) times daily as needed.    dicyclomine (BENTYL) 10 MG capsule, Take 1 capsule (10 mg total) by mouth 3 (three) times daily as needed for spasms.   diphenoxylate-atropine (LOMOTIL) 2.5-0.025 MG tablet, TAKE 1 TABLET BY MOUTH 4 TIMES DAILY AS NEEDED FOR DIARRHEA OR LOOSE STOOLS   estradiol (ESTRACE) 0.1 MG/GM vaginal cream, estradiol 0.01% (0.1 mg/gram) vaginal cream   lipase/protease/amylase (CREON) 36000 UNITS CPEP capsule, Take 2 capsules by mouth with each meal, 1 capsule by mouth with each snack   magnesium oxide (MAG-OX) 400 MG tablet, Take 200 mg by mouth daily.   Multiple Vitamins-Minerals (CENTRUM SILVER 50+WOMEN PO), Take 1 tablet by mouth daily.   mupirocin ointment (BACTROBAN) 2 %, APPLY 1 APPLICATION ON TO THE SKIN 2 TIMES DAILY   naloxone (NARCAN) 4 MG/0.1ML LIQD nasal spray kit, Narcan 4 mg/actuation nasal spray  PLACE 1 SPRAY INTO EACH NOSTRIL FOR SUSPECTED OPIOID OVERDOSE. MAY REPEAT 2 - 3 MINUTES WITH NEW BOTTLE IN OTHER NOSTRIL   olopatadine (PATANOL) 0.1 % ophthalmic solution, Place 1 drop into both eyes 2 (two) times daily.   Omega-3 Fatty Acids (FISH OIL BURP-LESS) 1000 MG CAPS, Takes 1000 mg BID   omeprazole (PRILOSEC) 40 MG capsule, TAKE 1 CAPSULE BY MOUTH ONCE DAILY   ondansetron (ZOFRAN) 4 MG tablet, Take 1 tablet (4 mg total) by mouth every 8 (eight) hours as needed.   sertraline (ZOLOFT) 25 MG tablet, TAKE 1 TABLET (25 MG TOTAL) BY MOUTH DAILY.   vitamin E 400 UNIT capsule, Take 400 Units by mouth daily.   zinc gluconate  50 MG tablet, Take 50 mg by mouth daily.   Reviewed prior external information including notes and imaging from  primary care provider As well as notes that were available from care everywhere and other healthcare  systems.  Past medical history, social, surgical and family history all reviewed in electronic medical record.  No pertanent information unless stated regarding to the chief complaint.   Review of Systems:  No headache, visual changes, nausea, vomiting, diarrhea, constipation, dizziness, abdominal pain, skin rash, fevers, chills, night sweats, weight loss, swollen lymph nodes, body aches, joint swelling, chest pain, shortness of breath, mood changes. POSITIVE muscle aches  Objective  Blood pressure 120/80, pulse 74, height 5' 2"  (1.575 m), weight 152 lb (68.9 kg), SpO2 98 %.   General: No apparent distress alert and oriented x3 mood and affect normal, dressed appropriately.  HEENT: Pupils equal, extraocular movements intact  Respiratory: Patient's speak in full sentences and does not appear short of breath  Cardiovascular: No lower extremity edema, non tender, no erythema  Gait antalgic favoring the left foot. Left ankle exam appears to have fairly good range of motion but mild pain over the talar dome.  Severe low tenderness over the midfoot noted.  Mild swelling in this area.  No significant dental abnormality visually.  Neurovascularly intact distally.  No bruising.  No pain in the calf.  Minimal pain over the ATFL and no pain over the medial or lateral malleolus.     Impression and Recommendations:     The above documentation has been reviewed and is accurate and complete Lyndal Pulley, DO

## 2021-02-02 NOTE — Patient Instructions (Signed)
Prednisone 20mg  for 7 days Shoe with a lot of walking See me again in 6 weeks Xray today on way out

## 2021-02-03 ENCOUNTER — Encounter: Payer: Self-pay | Admitting: Family Medicine

## 2021-02-03 DIAGNOSIS — M79672 Pain in left foot: Secondary | ICD-10-CM | POA: Insufficient documentation

## 2021-02-03 NOTE — Assessment & Plan Note (Signed)
Left foot pain.  Patient may have a potential stress reaction.  I do not believe it is a true fracture.  I believe that patient's ankle is relatively well.  Postop shoe given today.  Warned that we should consider the possibility of imaging with a x-ray.  Held on any imaging of the ultrasound today.  Patient will follow up with me again as stated in the AVS.  Worsening pain will call us sooner.

## 2021-02-22 ENCOUNTER — Other Ambulatory Visit (HOSPITAL_BASED_OUTPATIENT_CLINIC_OR_DEPARTMENT_OTHER): Payer: Self-pay

## 2021-02-22 ENCOUNTER — Other Ambulatory Visit: Payer: Self-pay | Admitting: Internal Medicine

## 2021-02-22 DIAGNOSIS — K219 Gastro-esophageal reflux disease without esophagitis: Secondary | ICD-10-CM

## 2021-02-22 MED ORDER — OMEPRAZOLE 40 MG PO CPDR
DELAYED_RELEASE_CAPSULE | Freq: Every day | ORAL | 0 refills | Status: DC
  Filled 2021-02-22: qty 90, 90d supply, fill #0

## 2021-02-23 NOTE — Progress Notes (Signed)
[facility] [place] Phone: [phone] Subjective:   [name], am serving as a scribe for Dr. Hulan Saas. This visit occurred during the SARS-CoV-2 public health emergency.  Safety protocols were in place, including screening questions prior to the visit, additional usage of staff PPE, and extensive cleaning of exam room while observing appropriate contact time as indicated for disinfecting solutions.   I'm seeing this patient by the request  of:  Binnie Rail, MD  CC: Left foot pain follow-up  [id number]  02/02/2021 Left foot pain.  Patient may have a potential stress reaction.  I do not believe it is a true fracture.  I believe that patient's ankle is relatively well.  Postop shoe given today.  Warned that we should consider the possibility of imaging with a x-ray.  Held on any imaging of the ultrasound today.  Patient will follow up with me again as stated in the AVS.  Worsening pain will call us sooner.  Update 02/24/2021 KARNISHA LEFEBRE is a 75 y.o. female coming in with complaint of L foot and L shoulder pain. Patient states that she took prednisone and she did not have any pain in shoulder, legs, or foot. Patient did not take brace with her and pain did return but was less than it has been.   Patient states that shoulder pain has increased recently. L shoulder pain radiates into L arm. Pain primarily is in the upper arm. Did have injection in shoulder years ago but felt no difference.   Patient did have x-rays at last exam that were independently visualized by me showing degenerative changes of the first MTP    Past Medical History:  Diagnosis Date   Allergy    seasonal   Ankylosing spondylitis ([place])    Anxiety    Arthritis    Bell palsy    CAD (coronary artery disease)    a. Coronary CT 10/2016 showed calcium score in the 59th perecntile, moderate CAD in RCA/PDA and narrowing of the mid to distal LAD,  with negative FFR.   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[facility])    Osteoarthritis    Osteopenia    [facility] [address]   Pre-diabetes    PSVT (paroxysmal supraventricular tachycardia) ([place])    S/P total hysterectomy and bilateral salpingo-oophorectomy 04/12/2014   Sinusitis    Sleep apnea    c-pap   Subjective visual disturbance of both eyes 12/30/2013   Tubular adenoma of colon    Vitamin D deficiency    Xerostomia    and Xeroophthalmia   Past Surgical History:  Procedure Laterality Date   BLADDER SURGERY     for incontinence   CARPAL TUNNEL RELEASE Left 12/22/2014   Procedure: LEFT CARPAL TUNNEL RELEASE;  Surgeon: Daryll Brod,  MD;  Location: [facility];  Service: Orthopedics;  Laterality: Left;   CARPAL TUNNEL RELEASE Right 02/18/2019   Procedure: RIGHT CARPAL TUNNEL RELEASE;  Surgeon: Daryll Brod, MD;  Location: [facility];  Service: Orthopedics;  Laterality: Right;   COLONOSCOPY W/ POLYPECTOMY     adenomatous poyp 04-2008   colonscopy     tics 10-2002   CYSTOSCOPY WITH RETROGRADE PYELOGRAM, URETEROSCOPY AND STENT PLACEMENT Left 09/01/2013   Procedure: CYSTOSCOPY WITH RETROGRADE PYELOGRAM, AND LEFT STENT PLACEMENT;  Surgeon: Molli Hazard, MD;  Location: [facility];  Service: Urology;  Laterality: Left;   elevated LFTs     due to Elmiron   FINGER SURGERY Left 2004   2nd finger   G 1 P 1     interstitial cystitis     with clot in  catheter   PARTIAL HYSTERECTOMY     with one ovary left   SHOULDER ARTHROSCOPY Left    TOTAL ABDOMINAL HYSTERECTOMY W/ BILATERAL SALPINGOOPHORECTOMY     dysfunctional menses   Social History   Socioeconomic History   Marital status: Married    Spouse name: Not on file   Number of children: 1   Years of education: BS   Highest education level: Not on file  Occupational History   Occupation: retired   Occupation: Pharmacist, hospital    Comment: Kindergarten  Tobacco Use   Smoking status: Never   Smokeless tobacco: Never  Vaping Use   Vaping Use: Never used  Substance and Sexual Activity   Alcohol use: Yes    Comment: social   Drug use: No   Sexual activity: Not Currently  Other Topics Concern   Not on file  Social History Narrative   Lives at home with husband.   No daily use of caffeine.   Right-handed.         Social Determinants of Health   Financial Resource Strain: Not on file  Food Insecurity: Not on file  Transportation Needs: Not on file  Physical Activity: Not on file  Stress: Not on file  Social Connections: Not on file   Allergies  Allergen Reactions   Bactrim [Sulfamethoxazole-Trimethoprim] Other (See Comments)   Nitroglycerin Anaphylaxis    Hypotensive after NTG administration in ER during chest pain evaluation Pt states she almost died,  Pt states in a procedure done in 10/2016 she had no reaction to this medication, so not sure if it is an actual allergy.    Pentosan Polysulfate Other (See Comments)    Elevated liver count Elevated liver enzymes Elevated LFTs.......Marland Kitchen elmiron    Pentosan Polysulfate Sodium     Elevated LFTs.......Marland Kitchen elmiron    Promethazine Hcl Other (See Comments)    "jittery on the inside"   Sulfamethoxazole-Trimethoprim Nausea Only    Unknown reaction per pt, Pt thinks she may have felt sick to her stomach.    Losartan Other (See Comments)    Pt states caused insomnia, tingling sensation in legs, lightheaded, stomach pain    Promethazine Hcl    Lisinopril Cough   Family History  Problem Relation Age of Onset   Coronary artery disease Mother    Hypertension Mother    Asthma Mother    Dementia Mother    Stroke Mother        mini cva   Diabetes Father    Cirrhosis Father        non alcoholic   Colon cancer Sister 87   Allergic rhinitis Sister  Allergic rhinitis Sister    Food Allergy Sister    Renal cancer Paternal Aunt        x 2   Coronary artery disease Maternal Grandfather    Breast cancer Maternal Aunt    Diabetes Paternal Aunt    Diabetes Paternal Grandmother    Esophageal cancer Neg Hx    Stomach cancer Neg Hx     Current Outpatient Medications (Endocrine & Metabolic):    predniSONE (DELTASONE) 20 MG tablet, Take 1 tablet (20 mg total) by mouth daily.  Current Outpatient Medications (Cardiovascular):    amLODipine (NORVASC) 2.5 MG tablet, Take 1 tablet (2.5 mg total) by mouth daily.   EPINEPHrine (EPIPEN 2-PAK) 0.3 mg/0.3 mL IJ SOAJ injection, Use as directed for severe allergic reactions   rosuvastatin (CRESTOR) 20 MG tablet, Take 1 tablet (20 mg total) by mouth daily.  Current Outpatient Medications (Respiratory):    azelastine (ASTELIN) 0.1 % nasal spray, One to two sprays each nostril twice a day as needed.   cetirizine (ZYRTEC) 10 MG tablet, Take 10 mg by mouth daily.  Current Outpatient Medications (Analgesics):    acetaminophen (TYLENOL) 500 MG tablet, Take 1,000 mg by mouth 3 (three) times daily as needed.    aspirin 81 MG tablet, Take 81 mg by mouth daily.   HYDROcodone-acetaminophen (NORCO/VICODIN) 5-325 MG tablet, hydrocodone 5 mg-acetaminophen 325 mg tablet  TAKE 1 TABLET BY MOUTH THREE TIMES DAILY AS NEEDED FOR 7 DAYS  Current Outpatient Medications (Hematological):    vitamin B-12 (CYANOCOBALAMIN) 1000 MCG tablet, Take 1,000 mcg by mouth 2 (two) times daily.   Current Outpatient Medications (Other):    Ascorbic Acid (VITAMIN C) 1000 MG tablet, Take 1,000 mg by mouth 2  (two) times daily.    Cholecalciferol (VITAMIN D-3) 25 MCG (1000 UT) CAPS, Take 10,000 Units by mouth daily.    diclofenac Sodium (VOLTAREN) 1 % GEL, Apply topically 4 (four) times daily as needed.    dicyclomine (BENTYL) 10 MG capsule, Take 1 capsule (10 mg total) by mouth 3 (three) times daily as needed for spasms.   diphenoxylate-atropine (LOMOTIL) 2.5-0.025 MG tablet, TAKE 1 TABLET BY MOUTH 4 TIMES DAILY AS NEEDED FOR DIARRHEA OR LOOSE STOOLS   DULoxetine (CYMBALTA) 20 MG capsule, Take 1 capsule (20 mg total) by mouth daily.   estradiol (ESTRACE) 0.1 MG/GM vaginal cream, estradiol 0.01% (0.1 mg/gram) vaginal cream   lipase/protease/amylase (CREON) 36000 UNITS CPEP capsule, Take 2 capsules by mouth with each meal, 1 capsule by mouth with each snack   magnesium oxide (MAG-OX) 400 MG tablet, Take 200 mg by mouth daily.   Multiple Vitamins-Minerals (CENTRUM SILVER 50+WOMEN PO), Take 1 tablet by mouth daily.   mupirocin ointment (BACTROBAN) 2 %, APPLY 1 APPLICATION ON TO THE SKIN 2 TIMES DAILY   naloxone (NARCAN) 4 MG/0.1ML LIQD nasal spray kit, Narcan 4 mg/actuation nasal spray  PLACE 1 SPRAY INTO EACH NOSTRIL FOR SUSPECTED OPIOID OVERDOSE. MAY REPEAT 2 - 3 MINUTES WITH NEW BOTTLE IN OTHER NOSTRIL   olopatadine (PATANOL) 0.1 % ophthalmic solution, Place 1 drop into both eyes 2 (two) times daily.   Omega-3 Fatty Acids (FISH OIL BURP-LESS) 1000 MG CAPS, Takes 1000 mg BID   omeprazole (PRILOSEC) 40 MG capsule, TAKE 1 CAPSULE BY MOUTH ONCE DAILY   ondansetron (ZOFRAN) 4 MG tablet, Take 1 tablet (4 mg total) by mouth every 8 (eight) hours as needed.   sertraline (ZOLOFT) 25 MG tablet, TAKE 1 TABLET (25 MG TOTAL) BY MOUTH DAILY.  vitamin E 400 UNIT capsule, Take 400 Units by mouth daily.   zinc gluconate 50 MG tablet, Take 50 mg by mouth daily.   Reviewed prior external information including notes and imaging from  primary care provider As well as notes that were available from care everywhere and  other healthcare systems.  Past medical history, social, surgical and family history all reviewed in electronic medical record.  No pertanent information unless stated regarding to the chief complaint.   Review of Systems:  No headache, visual changes, nausea, vomiting, diarrhea, constipation, dizziness, abdominal pain, skin rash, fevers, chills, night sweats, weight loss, swollen lymph nodes,  joint swelling, chest pain, shortness of breath, mood changes. POSITIVE muscle aches, body aches  Objective  Blood pressure 118/82, pulse (!) 56, height 5' 2"  (1.575 m), SpO2 97 %.   General: No apparent distress alert and oriented x3 mood and affect normal, dressed appropriately.  HEENT: Pupils equal, extraocular movements intact  Respiratory: Patient's speak in full sentences and does not appear short of breath  Cardiovascular: No lower extremity edema, non tender, no erythema  Gait normal with good balance and coordination.  MSK: Ankle exam shows the patient still is somewhat tender over the peroneal tendon.  Patient does sitting comfortably in the chair.  Was able to stand up without any significant worsening of pain.    Impression and Recommendations:     The above documentation has been reviewed and is accurate and complete Lyndal Pulley, DO

## 2021-02-24 ENCOUNTER — Ambulatory Visit (INDEPENDENT_AMBULATORY_CARE_PROVIDER_SITE_OTHER): Payer: Medicare Other | Admitting: Family Medicine

## 2021-02-24 ENCOUNTER — Other Ambulatory Visit: Payer: Self-pay

## 2021-02-24 ENCOUNTER — Other Ambulatory Visit (HOSPITAL_BASED_OUTPATIENT_CLINIC_OR_DEPARTMENT_OTHER): Payer: Self-pay

## 2021-02-24 ENCOUNTER — Ambulatory Visit: Payer: Self-pay

## 2021-02-24 ENCOUNTER — Encounter: Payer: Self-pay | Admitting: Family Medicine

## 2021-02-24 VITALS — BP 118/82 | HR 56 | Ht 62.0 in

## 2021-02-24 DIAGNOSIS — M79672 Pain in left foot: Secondary | ICD-10-CM | POA: Diagnosis not present

## 2021-02-24 DIAGNOSIS — I2510 Atherosclerotic heart disease of native coronary artery without angina pectoris: Secondary | ICD-10-CM

## 2021-02-24 DIAGNOSIS — M2550 Pain in unspecified joint: Secondary | ICD-10-CM | POA: Diagnosis not present

## 2021-02-24 MED ORDER — DULOXETINE HCL 20 MG PO CPEP
20.0000 mg | ORAL_CAPSULE | Freq: Every day | ORAL | 0 refills | Status: DC
  Filled 2021-02-24: qty 30, 30d supply, fill #0

## 2021-02-24 NOTE — Patient Instructions (Signed)
Cymbalta '20mg'$   Stop Zoloft See me in 6 weeks

## 2021-02-24 NOTE — Assessment & Plan Note (Signed)
Patient has had polyarthralgia for some time.  Muscle aches and body aches.  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  At this point we will start on a very low-dose of Cymbalta.  Patient was on a very low-dose of sertraline and can discontinue that if she would like.  We will see how patient responds to low-dose and increase where appropriate.  Patient will follow up with me again in 4 to 6 weeks to see how patient is responding.  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

## 2021-03-02 DIAGNOSIS — N3946 Mixed incontinence: Secondary | ICD-10-CM | POA: Diagnosis not present

## 2021-03-02 DIAGNOSIS — N3281 Overactive bladder: Secondary | ICD-10-CM | POA: Diagnosis not present

## 2021-03-02 DIAGNOSIS — E119 Type 2 diabetes mellitus without complications: Secondary | ICD-10-CM | POA: Diagnosis not present

## 2021-03-07 ENCOUNTER — Other Ambulatory Visit: Payer: Medicare Other | Admitting: *Deleted

## 2021-03-07 ENCOUNTER — Other Ambulatory Visit: Payer: Self-pay

## 2021-03-07 DIAGNOSIS — I10 Essential (primary) hypertension: Secondary | ICD-10-CM | POA: Diagnosis not present

## 2021-03-07 DIAGNOSIS — I471 Supraventricular tachycardia: Secondary | ICD-10-CM

## 2021-03-07 DIAGNOSIS — E785 Hyperlipidemia, unspecified: Secondary | ICD-10-CM | POA: Diagnosis not present

## 2021-03-07 DIAGNOSIS — I77810 Thoracic aortic ectasia: Secondary | ICD-10-CM | POA: Diagnosis not present

## 2021-03-07 DIAGNOSIS — I2510 Atherosclerotic heart disease of native coronary artery without angina pectoris: Secondary | ICD-10-CM | POA: Diagnosis not present

## 2021-03-07 LAB — COMPREHENSIVE METABOLIC PANEL
ALT: 24 IU/L (ref 0–32)
AST: 25 IU/L (ref 0–40)
Albumin/Globulin Ratio: 1.7 (ref 1.2–2.2)
Albumin: 4.3 g/dL (ref 3.7–4.7)
Alkaline Phosphatase: 103 IU/L (ref 44–121)
BUN/Creatinine Ratio: 16 (ref 12–28)
BUN: 16 mg/dL (ref 8–27)
Bilirubin Total: 0.4 mg/dL (ref 0.0–1.2)
CO2: 22 mmol/L (ref 20–29)
Calcium: 9.5 mg/dL (ref 8.7–10.3)
Chloride: 107 mmol/L — ABNORMAL HIGH (ref 96–106)
Creatinine, Ser: 1.02 mg/dL — ABNORMAL HIGH (ref 0.57–1.00)
Globulin, Total: 2.6 g/dL (ref 1.5–4.5)
Glucose: 98 mg/dL (ref 65–99)
Potassium: 4.4 mmol/L (ref 3.5–5.2)
Sodium: 142 mmol/L (ref 134–144)
Total Protein: 6.9 g/dL (ref 6.0–8.5)
eGFR: 57 mL/min/{1.73_m2} — ABNORMAL LOW (ref >59)

## 2021-03-07 LAB — LIPID PANEL
Chol/HDL Ratio: 1.9 ratio (ref 0.0–4.4)
Cholesterol, Total: 115 mg/dL (ref 100–199)
HDL: 61 mg/dL (ref >39)
LDL Chol Calc (NIH): 33 mg/dL (ref 0–99)
Triglycerides: 120 mg/dL (ref 0–149)
VLDL Cholesterol Cal: 21 mg/dL (ref 5–40)

## 2021-03-16 ENCOUNTER — Other Ambulatory Visit: Payer: Self-pay

## 2021-03-16 ENCOUNTER — Ambulatory Visit (INDEPENDENT_AMBULATORY_CARE_PROVIDER_SITE_OTHER): Payer: Medicare Other

## 2021-03-16 VITALS — BP 118/80 | HR 69 | Temp 98.0°F | Ht 62.0 in | Wt 154.0 lb

## 2021-03-16 DIAGNOSIS — Z0000 Encounter for general adult medical examination without abnormal findings: Secondary | ICD-10-CM | POA: Diagnosis not present

## 2021-03-16 NOTE — Progress Notes (Signed)
Subjective:   [name] is a 75 y.o. female who presents for Medicare Annual (Subsequent) preventive examination.  Review of Systems     Cardiac Risk Factors include: advanced age (>33mn, >>73women);diabetes mellitus;dyslipidemia;family history of premature cardiovascular disease;hypertension     Objective:    Today's Vitals   03/16/21 0905  BP: 118/80  Pulse: 69  Temp: 98 F (36.7 C)  SpO2: 95%  Weight: 154 lb (69.9 kg)  Height: 5' 2"  (1.575 m)  PainSc: 0-No pain   Body mass index is 28.17 kg/m.  Advanced Directives 03/16/2021 06/10/2020 12/04/2019 02/18/2019 02/10/2019 11/29/2018 05/17/2018  Does Patient Have a Medical Advance Directive? Yes Yes Yes No No No No  Type of Advance Directive Living will;[place] - - - -  Does patient want to make changes to medical advance directive? No - Patient declined No - Patient declined No - Patient declined - - - -  Copy of [place] Chart? No - copy requested - No - copy requested - - - -  Would patient like information on creating a medical advance directive? - - - No - Patient declined No - Patient declined No - Patient declined -    Current Medications (verified) Outpatient Encounter Medications as of 03/16/2021  Medication Sig   acetaminophen (TYLENOL) 500 MG tablet Take 1,000 mg by mouth 3 (three) times daily as needed.    amLODipine (NORVASC) 2.5 MG tablet Take 1 tablet (2.5 mg total) by mouth daily.   Ascorbic Acid (VITAMIN C) 1000 MG tablet Take 1,000 mg by mouth 2 (two) times daily.    aspirin 81 MG tablet Take 81 mg by mouth daily.   azelastine (ASTELIN) 0.1 % nasal spray One to two sprays each nostril twice a day as needed.   cetirizine (ZYRTEC) 10 MG tablet Take 10 mg by mouth daily.   Cholecalciferol (VITAMIN D-3) 25 MCG (1000 UT) CAPS Take 10,000 Units by mouth daily.    diclofenac Sodium (VOLTAREN) 1 % GEL Apply topically 4 (four)  times daily as needed.    dicyclomine (BENTYL) 10 MG capsule Take 1 capsule (10 mg total) by mouth 3 (three) times daily as needed for spasms.   diphenoxylate-atropine (LOMOTIL) 2.5-0.025 MG tablet TAKE 1 TABLET BY MOUTH 4 TIMES DAILY AS NEEDED FOR DIARRHEA OR LOOSE STOOLS   DULoxetine (CYMBALTA) 20 MG capsule Take 1 capsule (20 mg total) by mouth daily.   EPINEPHrine (EPIPEN 2-PAK) 0.3 mg/0.3 mL IJ SOAJ injection Use as directed for severe allergic reactions   estradiol (ESTRACE) 0.1 MG/GM vaginal cream estradiol 0.01% (0.1 mg/gram) vaginal cream   HYDROcodone-acetaminophen (NORCO/VICODIN) 5-325 MG tablet hydrocodone 5 mg-acetaminophen 325 mg tablet  TAKE 1 TABLET BY MOUTH THREE TIMES DAILY AS NEEDED FOR 7 DAYS   lipase/protease/amylase (CREON) 36000 UNITS CPEP capsule Take 2 capsules by mouth with each meal, 1 capsule by mouth with each snack   magnesium oxide (MAG-OX) 400 MG tablet Take 200 mg by mouth daily.   Multiple Vitamins-Minerals (CENTRUM SILVER 50+WOMEN PO) Take 1 tablet by mouth daily.   mupirocin ointment (BACTROBAN) 2 % APPLY 1 APPLICATION ON TO THE SKIN 2 TIMES DAILY   naloxone (NARCAN) 4 MG/0.1ML LIQD nasal spray kit Narcan 4 mg/actuation nasal spray  PLACE 1 SPRAY INTO EACH NOSTRIL FOR SUSPECTED OPIOID OVERDOSE. MAY REPEAT 2 - 3 MINUTES WITH NEW BOTTLE IN OTHER NOSTRIL   olopatadine (PATANOL) 0.1 % ophthalmic solution Place  1 drop into both eyes 2 (two) times daily.   Omega-3 Fatty Acids (FISH OIL BURP-LESS) 1000 MG CAPS Takes 1000 mg BID   omeprazole (PRILOSEC) 40 MG capsule TAKE 1 CAPSULE BY MOUTH ONCE DAILY   ondansetron (ZOFRAN) 4 MG tablet Take 1 tablet (4 mg total) by mouth every 8 (eight) hours as needed.   predniSONE (DELTASONE) 20 MG tablet Take 1 tablet (20 mg total) by mouth daily.   rosuvastatin (CRESTOR) 20 MG tablet Take 1 tablet (20 mg total) by mouth daily.   sertraline (ZOLOFT) 25 MG tablet TAKE 1 TABLET (25 MG TOTAL) BY MOUTH DAILY.   vitamin B-12  (CYANOCOBALAMIN) 1000 MCG tablet Take 1,000 mcg by mouth 2 (two) times daily.    vitamin E 400 UNIT capsule Take 400 Units by mouth daily.   zinc gluconate 50 MG tablet Take 50 mg by mouth daily.   No facility-administered encounter medications on file as of 03/16/2021.    Allergies (verified) Bactrim [sulfamethoxazole-trimethoprim], Nitroglycerin, Pentosan polysulfate, Pentosan polysulfate sodium, Promethazine hcl, Sulfamethoxazole-trimethoprim, Losartan, Promethazine hcl, and Lisinopril   History: Past Medical History:  Diagnosis Date   Allergy    seasonal   Ankylosing spondylitis (HCC)    Anxiety    Arthritis    Bell palsy    CAD (coronary artery disease)    a. Coronary CT 10/2016 showed calcium score in the 59th perecntile, moderate CAD in RCA/PDA and narrowing of the mid to distal LAD, with negative FFR.   CTS (carpal tunnel syndrome)    Cystitis    Diabetes mellitus without complication ([place])    Diverticulitis    Diverticulosis of colon    Dry eye syndrome    Exocrine pancreatic insufficiency    Frozen shoulder    GERD (gastroesophageal reflux disease)    HLA B27 (HLA B27 positive)    Hx of colonic polyps    Hyperlipidemia    NMR 2005   Hypertension    Hypothyroidism    Incontinence    Internal hemorrhoids    Interstitial cystitis    Kidney stone    Menopause    Mild aortic insufficiency    Mild ascending aorta dilatation (HCC)    Osteoarthritis    Osteopenia    [facility] [address]   Pre-diabetes    PSVT (paroxysmal supraventricular tachycardia) ([place])    S/P total hysterectomy and bilateral salpingo-oophorectomy 04/12/2014   Sinusitis    Sleep apnea    c-pap   Subjective visual disturbance of both eyes 12/30/2013   Tubular adenoma of colon    Vitamin D deficiency    Xerostomia    and Xeroophthalmia   Past Surgical History:  Procedure Laterality Date   BLADDER SURGERY     for incontinence   CARPAL TUNNEL RELEASE Left 12/22/2014   Procedure:  LEFT CARPAL TUNNEL RELEASE;  Surgeon: Daryll Brod, MD;  Location: [facility];  Service: Orthopedics;  Laterality: Left;   CARPAL TUNNEL RELEASE Right 02/18/2019   Procedure: RIGHT CARPAL TUNNEL RELEASE;  Surgeon: Daryll Brod, MD;  Location: [facility];  Service: Orthopedics;  Laterality: Right;   COLONOSCOPY W/ POLYPECTOMY     adenomatous poyp 04-2008   colonscopy     tics 10-2002   CYSTOSCOPY WITH RETROGRADE PYELOGRAM, URETEROSCOPY AND STENT PLACEMENT Left 09/01/2013   Procedure: CYSTOSCOPY WITH RETROGRADE PYELOGRAM, AND LEFT STENT PLACEMENT;  Surgeon: Molli Hazard, MD;  Location: [facility];  Service: Urology;  Laterality: Left;   elevated  LFTs     due to [place]   FINGER SURGERY Left 2004   2nd finger   G 1 P 1     interstitial cystitis     with clot in catheter   PARTIAL HYSTERECTOMY     with one ovary left   SHOULDER ARTHROSCOPY Left    TOTAL ABDOMINAL HYSTERECTOMY W/ BILATERAL SALPINGOOPHORECTOMY     dysfunctional menses   Family History  Problem Relation Age of Onset   Coronary artery disease Mother    Hypertension Mother    Asthma Mother    Dementia Mother    Stroke Mother        mini cva   Diabetes Father    Cirrhosis Father        non alcoholic   Colon cancer Sister 36   Allergic rhinitis Sister    Allergic rhinitis Sister    Food Allergy Sister    Renal cancer Paternal Aunt        x 2   Coronary artery disease Maternal Grandfather    Breast cancer Maternal Aunt    Diabetes Paternal Aunt    Diabetes Paternal Grandmother    Esophageal cancer Neg Hx    Stomach cancer Neg Hx    Social History   Socioeconomic History   Marital status: Married    Spouse name: Not on file   Number of children: 1   Years of education: BS   Highest education level: Not on file  Occupational History   Occupation: retired   Occupation: Pharmacist, hospital    Comment: Kindergarten  Tobacco Use   Smoking status: Never   Smokeless tobacco: Never  Vaping Use    Vaping Use: Never used  Substance and Sexual Activity   Alcohol use: Yes    Comment: social   Drug use: No   Sexual activity: Not Currently  Other Topics Concern   Not on file  Social History Narrative   Lives at home with husband.   No daily use of caffeine.   Right-handed.         Social Determinants of Health   Financial Resource Strain: Low Risk    Difficulty of Paying Living Expenses: Not hard at all  Food Insecurity: No Food Insecurity   Worried About Charity fundraiser in the Last Year: Never true   [place] in the Last Year: Never true  Transportation Needs: No Transportation Needs   Lack of Transportation (Medical): No   Lack of Transportation (Non-Medical): No  Physical Activity: Sufficiently Active   Days of Exercise per Week: 5 days   Minutes of Exercise per Session: 30 min  Stress: No Stress Concern Present   Feeling of Stress : Not at all  Social Connections: Socially Integrated   Frequency of Communication with Friends and Family: More than three times a week   Frequency of Social Gatherings with Friends and Family: Once a week   Attends Religious Services: More than 4 times per year   Active Member of [employer] or Organizations: Yes   Attends Music therapist: More than 4 times per year   Marital Status: Married    Tobacco Counseling Counseling given: Not Answered   Clinical Intake:  Pre-visit preparation completed: Yes  Pain : No/denies pain Pain Score: 0-No pain     BMI - recorded: 28.17 Nutritional Status: BMI 25 -29 Overweight Nutritional Risks: None Diabetes: No CBG done?: No Did pt. bring in CBG monitor from home?: No  How often do you need to have someone help you when you read instructions, pamphlets, or other written materials from your doctor or pharmacy?: 1 - Never What is the last grade level you completed in school?: Bachelor of Science Degree in Early Education  Diabetic? no  Interpreter Needed?:  No  Information entered by :: Lisette Abu, LPN   Activities of Daily Living In your present state of health, do you have any difficulty performing the following activities: 03/16/2021  Hearing? N  Vision? N  Difficulty concentrating or making decisions? N  Walking or climbing stairs? N  Dressing or bathing? N  Doing errands, shopping? N  Preparing Food and eating ? N  Using the Toilet? N  In the past six months, have you accidently leaked urine? Y  Comment wears protection  Do you have problems with loss of bowel control? N  Managing your Medications? N  Managing your Finances? N  Housekeeping or managing your Housekeeping? N  Some recent data might be hidden    Patient Care Team: Binnie Rail, MD as PCP - General (Internal Medicine) Dorothy Spark, MD (Inactive) as PCP - Cardiology (Cardiology) Pyrtle, Lajuan Lines, MD as Consulting Physician (Gastroenterology) Lyndal Pulley, DO as Consulting Physician (Family Medicine) Katy Apo, MD as Consulting Physician (Ophthalmology)  Indicate any recent Medical Services you may have received from other than [facility] providers in the past year (date may be approximate).     Assessment:   This is a routine wellness examination for [place].  Hearing/Vision screen Hearing Screening - Comments:: Patient denied any hearing difficulty. Vision Screening - Comments:: Patient wears glasses to drive.  Eye exam done annually by Dr. Katy Apo.  Dietary issues and exercise activities discussed: Current Exercise Habits: Home exercise routine, Type of exercise: walking, Time (Minutes): 30, Frequency (Times/Week): 5, Weekly Exercise (Minutes/Week): 150, Intensity: Moderate, Exercise limited by: psychological condition(s);orthopedic condition(s)   Goals Addressed               This Visit's Progress     Patient Stated (pt-stated)        My goal is to lose more weight for healthier reasons.      Depression Screen PHQ 2/9 Scores  03/16/2021 06/10/2020 12/04/2019 11/29/2018 11/23/2017 05/02/2017 06/02/2015  PHQ - 2 Score 0 0 0 2 2 0 1  PHQ- 9 Score - - - 2 3 - -    Fall Risk Fall Risk  03/16/2021 06/10/2020 04/20/2020 12/04/2019 11/29/2018  Falls in the past year? 0 0 0 0 0  Number falls in past yr: 0 0 0 0 0  Injury with Fall? 0 - 0 0 -  Risk for fall due to : No Fall Risks - No Fall Risks No Fall Risks -  Follow up Falls evaluation completed - Falls evaluation completed Falls evaluation completed;Education provided -    FALL RISK PREVENTION PERTAINING TO THE HOME:  Any stairs in or around the home? Yes  If so, are there any without handrails? No  Home free of loose throw rugs in walkways, pet beds, electrical cords, etc? Yes  Adequate lighting in your home to reduce risk of falls? Yes   ASSISTIVE DEVICES UTILIZED TO PREVENT FALLS:  Life alert? No  Use of a cane, walker or w/c? No  Grab bars in the bathroom? Yes  Shower chair or bench in shower? No  Elevated toilet seat or a handicapped toilet? Yes   TIMED UP AND GO:  Was the test  performed? Yes .  Length of time to ambulate 10 feet: 5 sec.   Gait steady and fast without use of assistive device  Cognitive Function: Normal cognitive status assessed by direct observation by this Nurse Health Advisor. No abnormalities found.          Immunizations Immunization History  Administered Date(s) Administered   Fluad Quad(high Dose 65+) 04/02/2019, 04/20/2020   Influenza Split 05/09/2011, 05/07/2012   Influenza Whole 08/01/2003, 05/29/2007, 04/15/2008, 04/30/2009, 05/03/2010   Influenza, High Dose Seasonal PF 05/01/2013, 04/18/2014, 04/13/2016, 05/02/2017, 04/15/2018   Influenza,inj,Quad PF,6+ Mos 05/21/2015   PFIZER(Purple Top)SARS-COV-2 Vaccination 09/13/2019, 10/08/2019, 06/04/2020   Pneumococcal Conjugate-13 02/25/2015   Pneumococcal Polysaccharide-23 07/31/2005, 05/27/2012   Td 06/27/2010   Tdap 08/17/2020   Zoster Recombinat (Shingrix) 06/15/2017,  08/20/2017   Zoster, Live 07/19/2012    TDAP status: Up to date  Flu Vaccine status: Up to date  Pneumococcal vaccine status: Up to date  Covid-19 vaccine status: Completed vaccines  Qualifies for Shingles Vaccine? Yes   Zostavax completed Yes   Shingrix Completed?: Yes  Screening Tests Health Maintenance  Topic Date Due   FOOT EXAM  11/02/2018   OPHTHALMOLOGY EXAM  05/14/2019   COVID-19 Vaccine (4 - Booster for Pfizer series) 09/04/2020   INFLUENZA VACCINE  02/28/2021   HEMOGLOBIN A1C  06/01/2021   URINE MICROALBUMIN  06/01/2021   COLONOSCOPY (Pts 45-82yr Insurance coverage will need to be confirmed)  01/24/2023   DEXA SCAN  01/25/2023   TETANUS/TDAP  08/17/2030   Hepatitis C Screening  Completed   PNA vac Low Risk Adult  Completed   Zoster Vaccines- Shingrix  Completed   HPV VACCINES  Aged Out    Health Maintenance  Health Maintenance Due  Topic Date Due   FOOT EXAM  11/02/2018   OPHTHALMOLOGY EXAM  05/14/2019   COVID-19 Vaccine (4 - Booster for Pfizer series) 09/04/2020   INFLUENZA VACCINE  02/28/2021    Colorectal cancer screening: Type of screening: Colonoscopy. Completed 01/23/2018. Repeat every 5 years  Mammogram status: Completed 2021. Repeat every year  Bone Density status: Completed 01/24/2021. Results reflect: Bone density results: OSTEOPENIA. Repeat every 2 years.  Lung Cancer Screening: (Low Dose CT Chest recommended if Age 77-80years, 30 pack-year currently smoking OR have quit w/in 15years.) does not qualify.   Lung Cancer Screening Referral: no  Additional Screening:  Hepatitis C Screening: does qualify; Completed yes  Vision Screening: Recommended annual ophthalmology exams for early detection of glaucoma and other disorders of the eye. Is the patient up to date with their annual eye exam?  Yes  Who is the provider or what is the name of the office in which the patient attends annual eye exams? GKaty Apo MD. If pt is not established  with a provider, would they like to be referred to a provider to establish care? No .   Dental Screening: Recommended annual dental exams for proper oral hygiene  Community Resource Referral / Chronic Care Management: CRR required this visit?  No   CCM required this visit?  No      Plan:     I have personally reviewed and noted the following in the patient's chart:   Medical and social history Use of alcohol, tobacco or illicit drugs  Current medications and supplements including opioid prescriptions.  Functional ability and status Nutritional status Physical activity Advanced directives List of other physicians Hospitalizations, surgeries, and ER visits in previous 12 months Vitals Screenings to include cognitive, depression, and falls  Referrals and appointments  In addition, I have reviewed and discussed with patient certain preventive protocols, quality metrics, and best practice recommendations. A written personalized care plan for preventive services as well as general preventive health recommendations were provided to patient.     Sheral Flow, LPN   10/27/760   Nurse Notes: n/a

## 2021-03-16 NOTE — Patient Instructions (Addendum)
[name] , Thank you for taking time to come for your Medicare Wellness Visit. I appreciate your ongoing commitment to your health goals. Please review the following plan we discussed and let me know if I can assist you in the future.   Screening recommendations/referrals: Colonoscopy: last done 01/23/2018; due every 5 years Mammogram: last done 2021; [facility] Bone Density: last done 01/24/2021; due every 2 years Recommended yearly ophthalmology/optometry visit for glaucoma screening and checkup Recommended yearly dental visit for hygiene and checkup  Vaccinations: Influenza vaccine: 04/20/2020; due Fall 2022 Pneumococcal vaccine: 05/27/2012, 02/25/2015  Tdap vaccine: 08/17/2020; due every 10 years Shingles vaccine: 06/15/2017, 08/20/2017   Covid-19: 09/13/2019, 10/08/2019, 06/04/2020  Advanced directives: Please bring a copy of your health care power of attorney and living will to the office at your convenience.  Conditions/risks identified: Yes; Client understands the importance of follow-up with providers by attending scheduled visits and discussed goals to eat healthier, increase physical activity, exercise the brain, socialize more, get enough sleep and make time for laughter.  Next appointment: Please schedule your next Medicare Wellness Visit with your Nurse Health Advisor in 1 year by calling [phone].   Preventive Care 26 Years and Older, Female Preventive care refers to lifestyle choices and visits with your health care provider that can promote health and wellness. What does preventive care include? A yearly physical exam. This is also called an annual well check. Dental exams once or twice a year. Routine eye exams. Ask your health care provider how often you should have your eyes checked. Personal lifestyle choices, including: Daily care of your teeth and gums. Regular physical activity. Eating a healthy diet. Avoiding tobacco and drug use. Limiting alcohol  use. Practicing safe sex. Taking low-dose aspirin every day. Taking vitamin and mineral supplements as recommended by your health care provider. What happens during an annual well check? The services and screenings done by your health care provider during your annual well check will depend on your age, overall health, lifestyle risk factors, and family history of disease. Counseling  Your health care provider may ask you questions about your: Alcohol use. Tobacco use. Drug use. Emotional well-being. Home and relationship well-being. Sexual activity. Eating habits. History of falls. Memory and ability to understand (cognition). Work and work Statistician. Reproductive health. Screening  You may have the following tests or measurements: Height, weight, and BMI. Blood pressure. Lipid and cholesterol levels. These may be checked every 5 years, or more frequently if you are over 10 years old. Skin check. Lung cancer screening. You may have this screening every year starting at age 55 if you have a 30-pack-year history of smoking and currently smoke or have quit within the past 15 years. Fecal occult blood test (FOBT) of the stool. You may have this test every year starting at age 62. Flexible sigmoidoscopy or colonoscopy. You may have a sigmoidoscopy every 5 years or a colonoscopy every 10 years starting at age 81. Hepatitis C blood test. Hepatitis B blood test. Sexually transmitted disease (STD) testing. Diabetes screening. This is done by checking your blood sugar (glucose) after you have not eaten for a while (fasting). You may have this done every 1-3 years. Bone density scan. This is done to screen for osteoporosis. You may have this done starting at age 53. Mammogram. This may be done every 1-2 years. Talk to your health care provider about how often you should have regular mammograms. Talk with your health care provider about your test results, treatment  options, and if necessary,  the need for more tests. Vaccines  Your health care provider may recommend certain vaccines, such as: Influenza vaccine. This is recommended every year. Tetanus, diphtheria, and acellular pertussis (Tdap, Td) vaccine. You may need a Td booster every 10 years. Zoster vaccine. You may need this after age 34. Pneumococcal 13-valent conjugate (PCV13) vaccine. One dose is recommended after age 22. Pneumococcal polysaccharide (PPSV23) vaccine. One dose is recommended after age 59. Talk to your health care provider about which screenings and vaccines you need and how often you need them. This information is not intended to replace advice given to you by your health care provider. Make sure you discuss any questions you have with your health care provider. Document Released: 08/13/2015 Document Revised: 04/05/2016 Document Reviewed: 05/18/2015 Elsevier Interactive Patient Education  2017 [place] Prevention in the Home Falls can cause injuries. They can happen to people of all ages. There are many things you can do to make your home safe and to help prevent falls. What can I do on the outside of my home? Regularly fix the edges of walkways and driveways and fix any cracks. Remove anything that might make you trip as you walk through a door, such as a raised step or threshold. Trim any bushes or trees on the path to your home. Use bright outdoor lighting. Clear any walking paths of anything that might make someone trip, such as rocks or tools. Regularly check to see if handrails are loose or broken. Make sure that both sides of any steps have handrails. Any raised decks and porches should have guardrails on the edges. Have any leaves, snow, or ice cleared regularly. Use sand or salt on walking paths during winter. Clean up any spills in your garage right away. This includes oil or grease spills. What can I do in the bathroom? Use night lights. Install grab bars by the toilet and in the  tub and shower. Do not use towel bars as grab bars. Use non-skid mats or decals in the tub or shower. If you need to sit down in the shower, use a plastic, non-slip stool. Keep the floor dry. Clean up any water that spills on the floor as soon as it happens. Remove soap buildup in the tub or shower regularly. Attach bath mats securely with double-sided non-slip rug tape. Do not have throw rugs and other things on the floor that can make you trip. What can I do in the bedroom? Use night lights. Make sure that you have a light by your bed that is easy to reach. Do not use any sheets or blankets that are too big for your bed. They should not hang down onto the floor. Have a firm chair that has side arms. You can use this for support while you get dressed. Do not have throw rugs and other things on the floor that can make you trip. What can I do in the kitchen? Clean up any spills right away. Avoid walking on wet floors. Keep items that you use a lot in easy-to-reach places. If you need to reach something above you, use a strong step stool that has a grab bar. Keep electrical cords out of the way. Do not use floor polish or wax that makes floors slippery. If you must use wax, use non-skid floor wax. Do not have throw rugs and other things on the floor that can make you trip. What can I do with my stairs? Do not  leave any items on the stairs. Make sure that there are handrails on both sides of the stairs and use them. Fix handrails that are broken or loose. Make sure that handrails are as long as the stairways. Check any carpeting to make sure that it is firmly attached to the stairs. Fix any carpet that is loose or worn. Avoid having throw rugs at the top or bottom of the stairs. If you do have throw rugs, attach them to the floor with carpet tape. Make sure that you have a light switch at the top of the stairs and the bottom of the stairs. If you do not have them, ask someone to add them for  you. What else can I do to help prevent falls? Wear shoes that: Do not have high heels. Have rubber bottoms. Are comfortable and fit you well. Are closed at the toe. Do not wear sandals. If you use a stepladder: Make sure that it is fully opened. Do not climb a closed stepladder. Make sure that both sides of the stepladder are locked into place. Ask someone to hold it for you, if possible. Clearly mark and make sure that you can see: Any grab bars or handrails. First and last steps. Where the edge of each step is. Use tools that help you move around (mobility aids) if they are needed. These include: Canes. Walkers. Scooters. Crutches. Turn on the lights when you go into a dark area. Replace any light bulbs as soon as they burn out. Set up your furniture so you have a clear path. Avoid moving your furniture around. If any of your floors are uneven, fix them. If there are any pets around you, be aware of where they are. Review your medicines with your doctor. Some medicines can make you feel dizzy. This can increase your chance of falling. Ask your doctor what other things that you can do to help prevent falls. This information is not intended to replace advice given to you by your health care provider. Make sure you discuss any questions you have with your health care provider. Document Released: 05/13/2009 Document Revised: 12/23/2015 Document Reviewed: 08/21/2014 Elsevier Interactive Patient Education  2017 [employer].

## 2021-03-21 ENCOUNTER — Other Ambulatory Visit (HOSPITAL_BASED_OUTPATIENT_CLINIC_OR_DEPARTMENT_OTHER): Payer: Self-pay

## 2021-04-07 ENCOUNTER — Other Ambulatory Visit: Payer: Self-pay

## 2021-04-07 ENCOUNTER — Ambulatory Visit (INDEPENDENT_AMBULATORY_CARE_PROVIDER_SITE_OTHER): Payer: Medicare Other | Admitting: Family Medicine

## 2021-04-07 ENCOUNTER — Ambulatory Visit (INDEPENDENT_AMBULATORY_CARE_PROVIDER_SITE_OTHER): Payer: Medicare Other

## 2021-04-07 ENCOUNTER — Ambulatory Visit: Payer: Self-pay

## 2021-04-07 ENCOUNTER — Encounter: Payer: Self-pay | Admitting: Family Medicine

## 2021-04-07 VITALS — BP 120/100 | HR 59 | Ht 62.0 in | Wt 154.0 lb

## 2021-04-07 DIAGNOSIS — M79672 Pain in left foot: Secondary | ICD-10-CM | POA: Diagnosis not present

## 2021-04-07 DIAGNOSIS — M2550 Pain in unspecified joint: Secondary | ICD-10-CM

## 2021-04-07 DIAGNOSIS — M25552 Pain in left hip: Secondary | ICD-10-CM | POA: Diagnosis not present

## 2021-04-07 DIAGNOSIS — M48062 Spinal stenosis, lumbar region with neurogenic claudication: Secondary | ICD-10-CM

## 2021-04-07 DIAGNOSIS — I2510 Atherosclerotic heart disease of native coronary artery without angina pectoris: Secondary | ICD-10-CM | POA: Diagnosis not present

## 2021-04-07 NOTE — Assessment & Plan Note (Signed)
Patient does have spinal stenosis.  Patient has been noncompliant with taking the duloxetine as prescribed.  Discussed with patient icing regimen and home exercises.  Discussed with patient that we can consider other treatment options if needed including potential injections.  At this point I would like to continue to monitor.  Follow-up with me again in 6 to 12 weeks.

## 2021-04-07 NOTE — Progress Notes (Signed)
[place] Phone: [phone] Subjective:   [name], am serving as a scribe for Dr. Hulan Saas.  This visit occurred during the SARS-CoV-2 public health emergency.  Safety protocols were in place, including screening questions prior to the visit, additional usage of staff PPE, and extensive cleaning of exam room while observing appropriate contact time as indicated for disinfecting solutions.   I'm seeing this patient by the request  of:  Binnie Rail, MD  CC: Left foot pain, other pains,  HWE:[id number]  02/24/2021 Patient has had polyarthralgia for some time.  Muscle aches and body aches.  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  At this point we will start on a very low-dose of Cymbalta.  Patient was on a very low-dose of sertraline and can discontinue that if she would like.  We will see how patient responds to low-dose and increase where appropriate.  Patient will follow up with me again in 4 to 6 weeks to see how patient is responding.  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  Update 04/07/2021 [name] is a 75 y.o. female coming in with complaint of L foot pain. Was put on Cymbalta last visit. Patient states that she has intermittent pain in B ankles. Patient states that she is having pain in multiple joints: L shoulder, L hip and L quad, B ankles.  Patient feels like her arthritis just seems to be getting worse.  Does not know why else she is getting so much discomfort.  Patient tries to be active but finds it difficult.      Past Medical History:  Diagnosis Date   Allergy    seasonal   Ankylosing spondylitis (HCC)    Anxiety    Arthritis    Bell palsy    CAD (coronary artery disease)    a. Coronary CT 10/2016 showed calcium score in the 59th perecntile, moderate CAD in RCA/PDA and narrowing  of the mid to distal LAD, with negative FFR.   CTS (carpal tunnel syndrome)    Cystitis    Diabetes mellitus without complication ([place])    Diverticulitis    Diverticulosis of colon    Dry eye syndrome    Exocrine pancreatic insufficiency    Frozen shoulder    GERD (gastroesophageal reflux disease)    HLA B27 (HLA B27 positive)    Hx of colonic polyps    Hyperlipidemia    NMR 2005   Hypertension    Hypothyroidism    Incontinence    Internal hemorrhoids    Interstitial cystitis    Kidney stone    Menopause    Mild aortic insufficiency    Mild ascending aorta dilatation (HCC)    Osteoarthritis    Osteopenia    [facility] [address]   Pre-diabetes    PSVT (paroxysmal supraventricular tachycardia) ([place])    S/P total hysterectomy and bilateral salpingo-oophorectomy 04/12/2014   Sinusitis    Sleep apnea    c-pap   Subjective visual disturbance of both eyes 12/30/2013   Tubular adenoma of colon    Vitamin D deficiency    Xerostomia    and Xeroophthalmia   Past Surgical History:  Procedure Laterality Date   BLADDER SURGERY     for incontinence   CARPAL TUNNEL RELEASE Left 12/22/2014   Procedure:  LEFT CARPAL TUNNEL RELEASE;  Surgeon: Daryll Brod, MD;  Location: [facility];  Service: Orthopedics;  Laterality: Left;   CARPAL TUNNEL RELEASE Right 02/18/2019   Procedure: RIGHT CARPAL TUNNEL RELEASE;  Surgeon: Daryll Brod, MD;  Location: [facility];  Service: Orthopedics;  Laterality: Right;   COLONOSCOPY W/ POLYPECTOMY     adenomatous poyp 04-2008   colonscopy     tics 10-2002   CYSTOSCOPY WITH RETROGRADE PYELOGRAM, URETEROSCOPY AND STENT PLACEMENT Left 09/01/2013   Procedure: CYSTOSCOPY WITH RETROGRADE PYELOGRAM, AND LEFT STENT PLACEMENT;  Surgeon: Molli Hazard, MD;  Location: [facility];  Service: Urology;  Laterality: Left;   elevated LFTs     due to Elmiron   FINGER SURGERY Left 2004   2nd finger   G 1 P 1     interstitial cystitis      with clot in catheter   PARTIAL HYSTERECTOMY     with one ovary left   SHOULDER ARTHROSCOPY Left    TOTAL ABDOMINAL HYSTERECTOMY W/ BILATERAL SALPINGOOPHORECTOMY     dysfunctional menses   Social History   Socioeconomic History   Marital status: Married    Spouse name: Not on file   Number of children: 1   Years of education: BS   Highest education level: Not on file  Occupational History   Occupation: retired   Occupation: Pharmacist, hospital    Comment: Kindergarten  Tobacco Use   Smoking status: Never   Smokeless tobacco: Never  Vaping Use   Vaping Use: Never used  Substance and Sexual Activity   Alcohol use: Yes    Comment: social   Drug use: [name]   Sexual activity: Not Currently  Other Topics Concern   Not on file  Social History Narrative   Lives at home with husband.   [name] daily use of caffeine.   Right-handed.         Social Determinants of Health   Financial Resource Strain: Low Risk    Difficulty of Paying Living Expenses: Not hard at all  Food Insecurity: [name] Food Insecurity   Worried About Charity fundraiser in the Last Year: Never true   [place] in the Last Year: Never true  Transportation Needs: [name] Transportation Needs   Lack of Transportation (Medical): [name]   Lack of Transportation (Non-Medical): [name]  Physical Activity: Sufficiently Active   Days of Exercise per Week: 5 days   Minutes of Exercise per Session: 30 min  Stress: [name] Stress Concern Present   Feeling of Stress : Not at all  Social Connections: Socially Integrated   Frequency of Communication with Friends and Family: More than three times a week   Frequency of Social Gatherings with Friends and Family: Once a week   Attends Religious Services: More than 4 times per year   Active Member of [employer] or Organizations: Yes   Attends Music therapist: More than 4 times per year   Marital Status: Married   Allergies  Allergen Reactions   Bactrim [Sulfamethoxazole-Trimethoprim]  Other (See Comments)   Nitroglycerin Anaphylaxis    Hypotensive after NTG administration in ER during chest pain evaluation Pt states she almost died,  Pt states in a procedure done in 10/2016 she had [name] reaction to this medication, so not sure if it is an actual allergy.    Pentosan Polysulfate Other (See Comments)    Elevated liver count Elevated liver enzymes Elevated LFTs.......Marland Kitchen elmiron    Pentosan Polysulfate Sodium  Elevated LFTs.......Marland Kitchen elmiron    Promethazine Hcl Other (See Comments)    "jittery on the inside"   Sulfamethoxazole-Trimethoprim Nausea Only    Unknown reaction per pt, Pt thinks she may have felt sick to her stomach.    Losartan Other (See Comments)    Pt states caused insomnia, tingling sensation in legs, lightheaded, stomach pain   Promethazine Hcl    Lisinopril Cough   Family History  Problem Relation Age of Onset   Coronary artery disease Mother    Hypertension Mother    Asthma Mother    Dementia Mother    Stroke Mother        mini cva   Diabetes Father    Cirrhosis Father        non alcoholic   Colon cancer Sister 108   Allergic rhinitis Sister    Allergic rhinitis Sister    Food Allergy Sister    Renal cancer Paternal Aunt        x 2   Coronary artery disease Maternal Grandfather    Breast cancer Maternal Aunt    Diabetes Paternal Aunt    Diabetes Paternal Grandmother    Esophageal cancer Neg Hx    Stomach cancer Neg Hx     Current Outpatient Medications (Endocrine & Metabolic):    predniSONE (DELTASONE) 20 MG tablet, Take 1 tablet (20 mg total) by mouth daily.  Current Outpatient Medications (Cardiovascular):    amLODipine (NORVASC) 2.5 MG tablet, Take 1 tablet (2.5 mg total) by mouth daily.   EPINEPHrine (EPIPEN 2-PAK) 0.3 mg/0.3 mL IJ SOAJ injection, Use as directed for severe allergic reactions   rosuvastatin (CRESTOR) 20 MG tablet, Take 1 tablet (20 mg total) by mouth daily.  Current Outpatient Medications (Respiratory):     azelastine (ASTELIN) 0.1 % nasal spray, One to two sprays each nostril twice a day as needed.   cetirizine (ZYRTEC) 10 MG tablet, Take 10 mg by mouth daily.  Current Outpatient Medications (Analgesics):    acetaminophen (TYLENOL) 500 MG tablet, Take 1,000 mg by mouth 3 (three) times daily as needed.    aspirin 81 MG tablet, Take 81 mg by mouth daily.   HYDROcodone-acetaminophen (NORCO/VICODIN) 5-325 MG tablet, hydrocodone 5 mg-acetaminophen 325 mg tablet  TAKE 1 TABLET BY MOUTH THREE TIMES DAILY AS NEEDED FOR 7 DAYS  Current Outpatient Medications (Hematological):    vitamin B-12 (CYANOCOBALAMIN) 1000 MCG tablet, Take 1,000 mcg by mouth 2 (two) times daily.   Current Outpatient Medications (Other):    Ascorbic Acid (VITAMIN C) 1000 MG tablet, Take 1,000 mg by mouth 2 (two) times daily.    Cholecalciferol (VITAMIN D-3) 25 MCG (1000 UT) CAPS, Take 10,000 Units by mouth daily.    diclofenac Sodium (VOLTAREN) 1 % GEL, Apply topically 4 (four) times daily as needed.    dicyclomine (BENTYL) 10 MG capsule, Take 1 capsule (10 mg total) by mouth 3 (three) times daily as needed for spasms.   DULoxetine (CYMBALTA) 20 MG capsule, Take 1 capsule (20 mg total) by mouth daily.   estradiol (ESTRACE) 0.1 MG/GM vaginal cream, estradiol 0.01% (0.1 mg/gram) vaginal cream   lipase/protease/amylase (CREON) 36000 UNITS CPEP capsule, Take 2 capsules by mouth with each meal, 1 capsule by mouth with each snack   magnesium oxide (MAG-OX) 400 MG tablet, Take 200 mg by mouth daily.   Multiple Vitamins-Minerals (CENTRUM SILVER 50+WOMEN PO), Take 1 tablet by mouth daily.   mupirocin ointment (BACTROBAN) 2 %, APPLY 1 APPLICATION ON TO THE SKIN 2  TIMES DAILY   naloxone (NARCAN) 4 MG/0.1ML LIQD nasal spray kit, Narcan 4 mg/actuation nasal spray  PLACE 1 SPRAY INTO EACH NOSTRIL FOR SUSPECTED OPIOID OVERDOSE. MAY REPEAT 2 - 3 MINUTES WITH NEW BOTTLE IN OTHER NOSTRIL   olopatadine (PATANOL) 0.1 % ophthalmic solution, Place 1 drop  into both eyes 2 (two) times daily.   Omega-3 Fatty Acids (FISH OIL BURP-LESS) 1000 MG CAPS, Takes 1000 mg BID   omeprazole (PRILOSEC) 40 MG capsule, TAKE 1 CAPSULE BY MOUTH ONCE DAILY   ondansetron (ZOFRAN) 4 MG tablet, Take 1 tablet (4 mg total) by mouth every 8 (eight) hours as needed.   sertraline (ZOLOFT) 25 MG tablet, TAKE 1 TABLET (25 MG TOTAL) BY MOUTH DAILY.   vitamin E 400 UNIT capsule, Take 400 Units by mouth daily.   zinc gluconate 50 MG tablet, Take 50 mg by mouth daily.   Reviewed prior external information including notes and imaging from  primary care provider As well as notes that were available from care everywhere and other healthcare systems.  Past medical history, social, surgical and family history all reviewed in electronic medical record.  [name] pertanent information unless stated regarding to the chief complaint.   Review of Systems:  [name] headache, visual changes, nausea, vomiting, diarrhea, constipation, dizziness, abdominal pain, skin rash, fevers, chills, night sweats, weight loss, swollen lymph nodes, chest pain, shortness of breath, mood changes. POSITIVE muscle aches, body aches, joint swelling  Objective  Blood pressure (!) 120/100, pulse (!) 59, height 5' 2"  (1.575 m), weight 154 lb (69.9 kg), SpO2 98 %.   General: [name] apparent distress alert and oriented x3 mood and affect normal, dressed appropriately.  HEENT: Pupils equal, extraocular movements intact  Respiratory: Patient's speak in full sentences and does not appear short of breath  Cardiovascular: [name] lower extremity edema, non tender, [name] erythema  Gait very minorly antalgic MSK: Arthritic changes of multiple joints.  Patient does have mild tenderness noted in multiple different areas. Patient was comfortable sitting in the chair.   Impression and Recommendations:     The above documentation has been reviewed and is accurate and complete Lyndal Pulley, DO

## 2021-04-07 NOTE — Patient Instructions (Signed)
Good to see you  [place] active but eat within 30 minutes of activity  More omega 3 foods instead of omega 6 foods.  Consider the cymbalta  See me again in 6-7 weeks Duloxetine Delayed-Release Capsules What is this medication? DULOXETINE (doo LOX e teen) treats depression, anxiety, fibromyalgia, and certain types of chronic pain such as nerve, bone, or joint pain. It increases the amount of serotonin and norepinephrine in the brain, hormones that help regulate mood and pain. It belongs to a group of medications called SNRIs. This medicine may be used for other purposes; ask your health care provider or pharmacist if you have questions. COMMON BRAND NAME(S): Cymbalta, Drizalma, Irenka What should I tell my care team before I take this medication? They need to know if you have any of these conditions: Bipolar disorder Glaucoma Kidney disease Liver disease Seizures Suicidal thoughts, plans or attempt; a previous suicide attempt by you or a family member Take medications that treat or prevent blood clots Pregnant or trying to get pregnant Breast-feeding How should I use this medication? Take this medication by mouth with a glass of water. Follow the directions on the prescription label. Do not crush, cut or chew some capsules of this medication. Some capsules may be opened and sprinkled on applesauce. Check with your care team or pharmacist if you are not sure. You can take this medication with or without food. Take your medication at regular intervals. Do not take your medication more often than directed. Do not stop taking this medication suddenly except upon the advice of your care team. Stopping this medication too quickly may cause serious side effects or your condition may worsen. A special MedGuide will be given to you by the pharmacist with each prescription and refill. Be sure to read this information carefully each time. Talk to your care team regarding the use of this medication in  children. While this medication may be prescribed for children as young as 13 years of age for selected conditions, precautions do apply. Overdosage: If you think you have taken too much of this medicine contact a poison control center or emergency room at once. NOTE: This medicine is only for you. Do not share this medicine with others. What if I miss a dose? If you miss a dose, take it as soon as you can. If it is almost time for your next dose, take only that dose. Do not take double or extra doses. What may interact with this medication? Do not take this medication with any of the following: Desvenlafaxine Levomilnacipran Linezolid MAOIs like Carbex, Eldepryl, Emsam, Marplan, Nardil, and Parnate Methylene blue (injected into a vein) Milnacipran Safinamide Thioridazine Venlafaxine Viloxazine This medication may also interact with the following: Alcohol Amphetamines Aspirin and aspirin-like medications Certain antibiotics like ciprofloxacin and enoxacin Certain medications for blood pressure, heart disease, irregular heart beat Certain medications for depression, anxiety, or psychotic disturbances Certain medications for migraine headache like almotriptan, eletriptan, frovatriptan, naratriptan, rizatriptan, sumatriptan, zolmitriptan Certain medications that treat or prevent blood clots like warfarin, enoxaparin, and dalteparin Cimetidine Fentanyl Lithium NSAIDS, medications for pain and inflammation, like ibuprofen or naproxen Phentermine Procarbazine Rasagiline Sibutramine St. John's wort Theophylline Tramadol Tryptophan This list may not describe all possible interactions. Give your health care provider a list of all the medicines, herbs, non-prescription drugs, or dietary supplements you use. Also tell them if you smoke, drink alcohol, or use illegal drugs. Some items may interact with your medicine. What should I watch for while using this  medication? Tell your care team  if your symptoms do not get better or if they get worse. Visit your care team for regular checks on your progress. Because it may take several weeks to see the full effects of this medication, it is important to continue your treatment as prescribed by your care team. This medication may cause serious skin reactions. They can happen weeks to months after starting the medication. Contact your care team right away if you notice fevers or flu-like symptoms with a rash. The rash may be red or purple and then turn into blisters or peeling of the skin. Or, you might notice a red rash with swelling of the face, lips, or lymph nodes in your neck or under your arms. Watch for new or worsening thoughts of suicide or depression. This includes sudden changes in mood, behaviors, or thoughts. These changes can happen at any time but are more common in the beginning of treatment or after a change in dose. Call your care team right away if you experience these thoughts or worsening depression. Manic episodes may happen in patients with bipolar disorder who take this medication. Watch for changes in feelings or behaviors such as feeling anxious, nervous, agitated, panicky, irritable, hostile, aggressive, impulsive, severely restless, overly excited and hyperactive, or trouble sleeping. These symptoms can happen at any time, but are more common in the beginning of treatment or after a change in dose. Call your care team right away if you notice any of these symptoms. You may get drowsy or dizzy. Do not drive, use machinery, or do anything that needs mental alertness until you know how this medication affects you. Do not stand or sit up quickly, especially if you are an older patient. This reduces the risk of dizzy or fainting spells. Alcohol may interfere with the effect of this medication. Avoid alcoholic drinks. This medication may increase blood sugar. The risk may be higher in patients who already have diabetes. Ask your  care team what you can do to lower your risk of diabetes while taking this medication. This medication can cause an increase in blood pressure. This medication can also cause a sudden drop in your blood pressure, which may make you feel faint and increase the chance of a fall. These effects are most common when you first start the medication or when the dose is increased, or during use of other medications that can cause a sudden drop in blood pressure. Check with your care team for instructions on monitoring your blood pressure while taking this medication. Your mouth may get dry. Chewing sugarless gum or sucking hard candy, and drinking plenty of water, may help. Contact your care team if the problem does not go away or is severe. What side effects may I notice from receiving this medication? Side effects that you should report to your care team as soon as possible: Allergic reactions-skin rash, itching, hives, swelling of the face, lips, tongue, or throat Bleeding-bloody or black, tar-like stools, red or dark brown urine, vomiting blood or brown material that looks like coffee grounds, small, red or purple spots on skin, unusual bleeding or bruising Increase in blood pressure Liver injury-right upper belly pain, loss of appetite, nausea, light-colored stool, dark yellow or brown urine, yellowing skin or eyes, unusual weakness or fatigue Low sodium level-muscle weakness, fatigue, dizziness, headache, confusion Redness, blistering, peeling, or loosening of the skin, including inside the mouth Serotonin syndrome-irritability, confusion, fast or irregular heartbeat, muscle stiffness, twitching muscles, sweating, high fever,  seizures, chills, vomiting, diarrhea Sudden eye pain or change in vision such as blurry vision, seeing halos around lights, vision loss Thoughts of suicide or self-harm, worsening mood, feelings of depression Trouble passing urine Side effects that usually do not require medical  attention (report to your care team if they continue or are bothersome): Change in sex drive or performance Constipation Diarrhea Dizziness Dry mouth Excessive sweating Loss of appetite Nausea Vomiting This list may not describe all possible side effects. Call your doctor for medical advice about side effects. You may report side effects to FDA at 1-800-FDA-1088. Where should I keep my medication? Keep out of the reach of children and pets. Store at room temperature between 15 and 30 degrees C (59 to 86 degrees F). Get rid of any unused medication after the expiration date. To get rid of medications that are no longer needed or have expired: Take the medication to a medication take-back program. Check with your pharmacy or law enforcement to find a location. If you cannot return the medication, check the label or package insert to see if the medication should be thrown out in the garbage or flushed down the toilet. If you are not sure, ask your care team. If it is safe to put it in the trash, take the medication out of the container. Mix the medication with cat litter, dirt, coffee grounds, or other unwanted substance. Seal the mixture in a bag or container. Put it in the trash. NOTE: This sheet is a summary. It may not cover all possible information. If you have questions about this medicine, talk to your doctor, pharmacist, or health care provider.  2022 Elsevier/Gold Standard (2020-07-26 12:32:25)

## 2021-04-07 NOTE — Assessment & Plan Note (Signed)
As we stated patient does have osteoarthritic changes as well as some potential rheumatological issues that is contributing.  He does have osteoporosis as well.  Continue the vitamin D.  We discussed again about the Cymbalta.  Given a handout to discuss the potential side effects.  Patient will consider this and then we will try to transition off of the Zoloft.  Follow-up again in 6 to 8 weeks

## 2021-04-11 DIAGNOSIS — H2513 Age-related nuclear cataract, bilateral: Secondary | ICD-10-CM | POA: Diagnosis not present

## 2021-04-11 DIAGNOSIS — H25043 Posterior subcapsular polar age-related cataract, bilateral: Secondary | ICD-10-CM | POA: Diagnosis not present

## 2021-04-11 DIAGNOSIS — H5213 Myopia, bilateral: Secondary | ICD-10-CM | POA: Diagnosis not present

## 2021-04-11 DIAGNOSIS — H25013 Cortical age-related cataract, bilateral: Secondary | ICD-10-CM | POA: Diagnosis not present

## 2021-04-14 ENCOUNTER — Other Ambulatory Visit (HOSPITAL_BASED_OUTPATIENT_CLINIC_OR_DEPARTMENT_OTHER): Payer: Self-pay

## 2021-04-14 ENCOUNTER — Other Ambulatory Visit: Payer: Self-pay | Admitting: Internal Medicine

## 2021-04-14 MED ORDER — MOXIFLOXACIN HCL 0.5 % OP SOLN
OPHTHALMIC | 1 refills | Status: DC
  Filled 2021-04-14: qty 3, 2d supply, fill #0

## 2021-04-14 MED ORDER — DIFLUPREDNATE 0.05 % OP EMUL
OPHTHALMIC | 1 refills | Status: DC
  Filled 2021-04-14: qty 5, 2d supply, fill #0

## 2021-04-15 ENCOUNTER — Other Ambulatory Visit (HOSPITAL_BASED_OUTPATIENT_CLINIC_OR_DEPARTMENT_OTHER): Payer: Self-pay

## 2021-04-15 MED ORDER — SERTRALINE HCL 25 MG PO TABS
ORAL_TABLET | Freq: Every day | ORAL | 1 refills | Status: DC
  Filled 2021-04-15: qty 90, 90d supply, fill #0

## 2021-04-26 DIAGNOSIS — H2511 Age-related nuclear cataract, right eye: Secondary | ICD-10-CM | POA: Diagnosis not present

## 2021-04-26 DIAGNOSIS — H25041 Posterior subcapsular polar age-related cataract, right eye: Secondary | ICD-10-CM | POA: Diagnosis not present

## 2021-04-26 DIAGNOSIS — H268 Other specified cataract: Secondary | ICD-10-CM | POA: Diagnosis not present

## 2021-04-26 DIAGNOSIS — H25011 Cortical age-related cataract, right eye: Secondary | ICD-10-CM | POA: Diagnosis not present

## 2021-04-26 DIAGNOSIS — H25811 Combined forms of age-related cataract, right eye: Secondary | ICD-10-CM | POA: Diagnosis not present

## 2021-04-27 ENCOUNTER — Other Ambulatory Visit (HOSPITAL_BASED_OUTPATIENT_CLINIC_OR_DEPARTMENT_OTHER): Payer: Self-pay

## 2021-04-27 MED ORDER — PREDNISONE 5 MG PO TABS
5.0000 mg | ORAL_TABLET | Freq: Every day | ORAL | 0 refills | Status: DC
  Filled 2021-04-27: qty 30, 30d supply, fill #0

## 2021-05-03 ENCOUNTER — Encounter: Payer: Self-pay | Admitting: Internal Medicine

## 2021-05-06 DIAGNOSIS — Z1231 Encounter for screening mammogram for malignant neoplasm of breast: Secondary | ICD-10-CM | POA: Diagnosis not present

## 2021-05-10 ENCOUNTER — Other Ambulatory Visit (HOSPITAL_BASED_OUTPATIENT_CLINIC_OR_DEPARTMENT_OTHER): Payer: Self-pay

## 2021-05-10 MED ORDER — MOXIFLOXACIN HCL 0.5 % OP SOLN
OPHTHALMIC | 1 refills | Status: DC
  Filled 2021-05-10: qty 3, 15d supply, fill #0

## 2021-05-10 MED ORDER — DIFLUPREDNATE 0.05 % OP EMUL
1.0000 [drp] | Freq: Four times a day (QID) | OPHTHALMIC | 1 refills | Status: DC
  Filled 2021-05-10: qty 5, 25d supply, fill #0

## 2021-05-17 NOTE — Progress Notes (Deleted)
[place] Phone: [phone] Subjective:    I'm seeing this patient by the request  of:  Binnie Rail, MD  CC:   [id number]  04/07/2021 As we stated patient does have osteoarthritic changes as well as some potential rheumatological issues that is contributing.  He does have osteoporosis as well.  Continue the vitamin D.  We discussed again about the Cymbalta.  Given a handout to discuss the potential side effects.  Patient will consider this and then we will try to transition off of the Zoloft.  Follow-up again in 6 to 8 weeks  Patient does have spinal stenosis.  Patient has been noncompliant with taking the duloxetine as prescribed.  Discussed with patient icing regimen and home exercises.  Discussed with patient that we can consider other treatment options if needed including potential injections.  At this point I would like to continue to monitor.  Follow-up with me again in 6 to 12 weeks.  Update 05/18/2021 [name] is a 75 y.o. female coming in with complaint of L foot, L hip and back pain. Patient states        Past Medical History:  Diagnosis Date   Allergy    seasonal   Ankylosing spondylitis (HCC)    Anxiety    Arthritis    Bell palsy    CAD (coronary artery disease)    a. Coronary CT 10/2016 showed calcium score in the 59th perecntile, moderate CAD in RCA/PDA and narrowing of the mid to distal LAD, with negative FFR.   CTS (carpal tunnel syndrome)    Cystitis    Diabetes mellitus without complication ([place])    Diverticulitis    Diverticulosis of colon    Dry eye syndrome    Exocrine pancreatic insufficiency    Frozen shoulder    GERD (gastroesophageal reflux disease)    HLA B27 (HLA B27 positive)    Hx of colonic polyps    Hyperlipidemia    NMR 2005   Hypertension    Hypothyroidism    Incontinence    Internal hemorrhoids    Interstitial cystitis    Kidney stone    Menopause     Mild aortic insufficiency    Mild ascending aorta dilatation (HCC)    Osteoarthritis    Osteopenia    [facility] [address]   Pre-diabetes    PSVT (paroxysmal supraventricular tachycardia) ([place])    S/P total hysterectomy and bilateral salpingo-oophorectomy 04/12/2014   Sinusitis    Sleep apnea    c-pap   Subjective visual disturbance of both eyes 12/30/2013   Tubular adenoma of colon    Vitamin D deficiency    Xerostomia    and Xeroophthalmia   Past Surgical History:  Procedure Laterality Date   BLADDER SURGERY     for incontinence   CARPAL TUNNEL RELEASE Left 12/22/2014   Procedure: LEFT CARPAL TUNNEL RELEASE;  Surgeon: Daryll Brod, MD;  Location: [facility];  Service: Orthopedics;  Laterality: Left;   CARPAL TUNNEL RELEASE Right 02/18/2019   Procedure: RIGHT CARPAL TUNNEL RELEASE;  Surgeon: Daryll Brod, MD;  Location: [facility];  Service: Orthopedics;  Laterality: Right;   COLONOSCOPY W/ POLYPECTOMY     adenomatous poyp 04-2008   colonscopy     tics 10-2002   CYSTOSCOPY WITH RETROGRADE PYELOGRAM, URETEROSCOPY AND STENT PLACEMENT Left 09/01/2013   Procedure: CYSTOSCOPY WITH RETROGRADE PYELOGRAM, AND LEFT STENT PLACEMENT;  Surgeon: Molli Hazard, MD;  Location: [facility];  Service: Urology;  Laterality: Left;   elevated LFTs     due to Elmiron   FINGER SURGERY Left 2004   2nd finger   G 1 P 1     interstitial cystitis     with clot in catheter   PARTIAL HYSTERECTOMY     with one ovary left   SHOULDER ARTHROSCOPY Left    TOTAL ABDOMINAL HYSTERECTOMY W/ BILATERAL SALPINGOOPHORECTOMY     dysfunctional menses   Social History   Socioeconomic History   Marital status: Married    Spouse name: Not on file   Number of children: 1   Years of education: BS   Highest education level: Not on file  Occupational History   Occupation: retired   Occupation: Pharmacist, hospital    Comment: Kindergarten  Tobacco Use   Smoking status: Never    Smokeless tobacco: Never  Vaping Use   Vaping Use: Never used  Substance and Sexual Activity   Alcohol use: Yes    Comment: social   Drug use: No   Sexual activity: Not Currently  Other Topics Concern   Not on file  Social History Narrative   Lives at home with husband.   No daily use of caffeine.   Right-handed.         Social Determinants of Health   Financial Resource Strain: Low Risk    Difficulty of Paying Living Expenses: Not hard at all  Food Insecurity: No Food Insecurity   Worried About Charity fundraiser in the Last Year: Never true   [place] in the Last Year: Never true  Transportation Needs: No Transportation Needs   Lack of Transportation (Medical): No   Lack of Transportation (Non-Medical): No  Physical Activity: Sufficiently Active   Days of Exercise per Week: 5 days   Minutes of Exercise per Session: 30 min  Stress: No Stress Concern Present   Feeling of Stress : Not at all  Social Connections: Socially Integrated   Frequency of Communication with Friends and Family: More than three times a week   Frequency of Social Gatherings with Friends and Family: Once a week   Attends Religious Services: More than 4 times per year   Active Member of [employer] or Organizations: Yes   Attends Music therapist: More than 4 times per year   Marital Status: Married   Allergies  Allergen Reactions   Bactrim [Sulfamethoxazole-Trimethoprim] Other (See Comments)   Nitroglycerin Anaphylaxis    Hypotensive after NTG administration in ER during chest pain evaluation Pt states she almost died,  Pt states in a procedure done in 10/2016 she had no reaction to this medication, so not sure if it is an actual allergy.    Pentosan Polysulfate Other (See Comments)    Elevated liver count Elevated liver enzymes Elevated LFTs.......Marland Kitchen elmiron    Pentosan Polysulfate Sodium     Elevated LFTs.......Marland Kitchen elmiron    Promethazine Hcl Other (See Comments)    "jittery on  the inside"   Sulfamethoxazole-Trimethoprim Nausea Only    Unknown reaction per pt, Pt thinks she may have felt sick to her stomach.    Losartan Other (See Comments)    Pt states caused insomnia, tingling sensation in legs, lightheaded, stomach pain   Promethazine Hcl    Lisinopril Cough   Family History  Problem Relation Age of Onset   Coronary artery disease Mother    Hypertension Mother  Asthma Mother    Dementia Mother    Stroke Mother        mini cva   Diabetes Father    Cirrhosis Father        non alcoholic   Colon cancer Sister 49   Allergic rhinitis Sister    Allergic rhinitis Sister    Food Allergy Sister    Renal cancer Paternal Aunt        x 2   Coronary artery disease Maternal Grandfather    Breast cancer Maternal Aunt    Diabetes Paternal Aunt    Diabetes Paternal Grandmother    Esophageal cancer Neg Hx    Stomach cancer Neg Hx     Current Outpatient Medications (Endocrine & Metabolic):    predniSONE (DELTASONE) 20 MG tablet, Take 1 tablet (20 mg total) by mouth daily.   predniSONE (DELTASONE) 5 MG tablet, Take 1 tablet (5 mg total) by mouth daily.  Current Outpatient Medications (Cardiovascular):    amLODipine (NORVASC) 2.5 MG tablet, Take 1 tablet (2.5 mg total) by mouth daily.   EPINEPHrine (EPIPEN 2-PAK) 0.3 mg/0.3 mL IJ SOAJ injection, Use as directed for severe allergic reactions   rosuvastatin (CRESTOR) 20 MG tablet, Take 1 tablet (20 mg total) by mouth daily.  Current Outpatient Medications (Respiratory):    azelastine (ASTELIN) 0.1 % nasal spray, One to two sprays each nostril twice a day as needed.   cetirizine (ZYRTEC) 10 MG tablet, Take 10 mg by mouth daily.  Current Outpatient Medications (Analgesics):    acetaminophen (TYLENOL) 500 MG tablet, Take 1,000 mg by mouth 3 (three) times daily as needed.    aspirin 81 MG tablet, Take 81 mg by mouth daily.   HYDROcodone-acetaminophen (NORCO/VICODIN) 5-325 MG tablet, hydrocodone 5 mg-acetaminophen  325 mg tablet  TAKE 1 TABLET BY MOUTH THREE TIMES DAILY AS NEEDED FOR 7 DAYS  Current Outpatient Medications (Hematological):    vitamin B-12 (CYANOCOBALAMIN) 1000 MCG tablet, Take 1,000 mcg by mouth 2 (two) times daily.   Current Outpatient Medications (Other):    Ascorbic Acid (VITAMIN C) 1000 MG tablet, Take 1,000 mg by mouth 2 (two) times daily.    Cholecalciferol (VITAMIN D-3) 25 MCG (1000 UT) CAPS, Take 10,000 Units by mouth daily.    diclofenac Sodium (VOLTAREN) 1 % GEL, Apply topically 4 (four) times daily as needed.    dicyclomine (BENTYL) 10 MG capsule, Take 1 capsule (10 mg total) by mouth 3 (three) times daily as needed for spasms.   Difluprednate 0.05 % EMUL, Place 1 drop in the right eye 4 times a day starting 2 days prior to surgery.   Difluprednate 0.05 % EMUL, Place 1 drop into the left eye 4 (four) times daily. Start 2 days prior to surgery and use as directed after surgery.   DULoxetine (CYMBALTA) 20 MG capsule, Take 1 capsule (20 mg total) by mouth daily.   estradiol (ESTRACE) 0.1 MG/GM vaginal cream, estradiol 0.01% (0.1 mg/gram) vaginal cream   lipase/protease/amylase (CREON) 36000 UNITS CPEP capsule, Take 2 capsules by mouth with each meal, 1 capsule by mouth with each snack   magnesium oxide (MAG-OX) 400 MG tablet, Take 200 mg by mouth daily.   moxifloxacin (VIGAMOX) 0.5 % ophthalmic solution, Place 1 drop in the right eye 4 times a day starting 2 days prior to surgery and place 2 drops the morning of surgery.   moxifloxacin (VIGAMOX) 0.5 % ophthalmic solution, Place 1 drop into the left eye four times daily starting 2 days prior  to surgery and place 2 drops the morning of surgery.   Multiple Vitamins-Minerals (CENTRUM SILVER 50+WOMEN PO), Take 1 tablet by mouth daily.   mupirocin ointment (BACTROBAN) 2 %, APPLY 1 APPLICATION ON TO THE SKIN 2 TIMES DAILY   naloxone (NARCAN) 4 MG/0.1ML LIQD nasal spray kit, Narcan 4 mg/actuation nasal spray  PLACE 1 SPRAY INTO EACH NOSTRIL  FOR SUSPECTED OPIOID OVERDOSE. MAY REPEAT 2 - 3 MINUTES WITH NEW BOTTLE IN OTHER NOSTRIL   olopatadine (PATANOL) 0.1 % ophthalmic solution, Place 1 drop into both eyes 2 (two) times daily.   Omega-3 Fatty Acids (FISH OIL BURP-LESS) 1000 MG CAPS, Takes 1000 mg BID   omeprazole (PRILOSEC) 40 MG capsule, TAKE 1 CAPSULE BY MOUTH ONCE DAILY   ondansetron (ZOFRAN) 4 MG tablet, Take 1 tablet (4 mg total) by mouth every 8 (eight) hours as needed.   sertraline (ZOLOFT) 25 MG tablet, TAKE 1 TABLET (25 MG TOTAL) BY MOUTH DAILY.   vitamin E 400 UNIT capsule, Take 400 Units by mouth daily.   zinc gluconate 50 MG tablet, Take 50 mg by mouth daily.   Reviewed prior external information including notes and imaging from  primary care provider As well as notes that were available from care everywhere and other healthcare systems.  Past medical history, social, surgical and family history all reviewed in electronic medical record.  No pertanent information unless stated regarding to the chief complaint.   Review of Systems:  No headache, visual changes, nausea, vomiting, diarrhea, constipation, dizziness, abdominal pain, skin rash, fevers, chills, night sweats, weight loss, swollen lymph nodes, body aches, joint swelling, chest pain, shortness of breath, mood changes. POSITIVE muscle aches  Objective  There were no vitals taken for this visit.   General: No apparent distress alert and oriented x3 mood and affect normal, dressed appropriately.  HEENT: Pupils equal, extraocular movements intact  Respiratory: Patient's speak in full sentences and does not appear short of breath  Cardiovascular: No lower extremity edema, non tender, no erythema  Gait normal with good balance and coordination.  MSK:  Non tender with full range of motion and good stability and symmetric strength and tone of shoulders, elbows, wrist, hip, knee and ankles bilaterally.     Impression and Recommendations:     The above  documentation has been reviewed and is accurate and complete Jacqualin Combes

## 2021-05-18 ENCOUNTER — Ambulatory Visit: Payer: Medicare Other | Admitting: Family Medicine

## 2021-05-31 ENCOUNTER — Ambulatory Visit: Payer: Medicare Other | Admitting: Internal Medicine

## 2021-05-31 ENCOUNTER — Other Ambulatory Visit (HOSPITAL_BASED_OUTPATIENT_CLINIC_OR_DEPARTMENT_OTHER): Payer: Self-pay

## 2021-06-02 ENCOUNTER — Encounter: Payer: Self-pay | Admitting: Internal Medicine

## 2021-06-02 ENCOUNTER — Ambulatory Visit: Payer: Medicare Other | Admitting: Internal Medicine

## 2021-06-02 NOTE — Progress Notes (Signed)
Subjective:    Patient ID: [name], female    DOB: 1946/02/20, 75 y.o.   MRN: [id number]  This visit occurred during the SARS-CoV-2 public health emergency.  Safety protocols were in place, including screening questions prior to the visit, additional usage of staff PPE, and extensive cleaning of exam room while observing appropriate contact time as indicated for disinfecting solutions.     HPI The patient is here for follow up of their chronic medical problems, including DM, htn, hld, gerd, anxiety, depression  Yesterday she had N/V x 1 in the morning.  She has had some nausea for about one week that was mild. Yesterday she had stomach pain and it felt it is was twisted.  Yesterday she had some sweats. Today she has not eaten anything or taken her meds.  Today she feels a little nauseous.     She does have a lot of stress-she is a caregiver for her husband.  He has had more medical issues recently.  She has good days and some days are overwhelming.  She is not taking the sertraline - worried about side effects.    Medications and allergies reviewed with patient and updated if appropriate.  Patient Active Problem List   Diagnosis Date Noted   Left foot pain 02/03/2021   Bug bite 11/29/2020   Degenerative arthritis of knee, bilateral 11/24/2020   Cellulitis and abscess of buttock 08/17/2020   Right ankle effusion 05/10/2020   Elevated alkaline phosphatase level 04/20/2020   Neuralgia 04/20/2020   Swelling 12/19/2018   Hair loss 11/04/2018   Spinal stenosis, 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/13/2018   Atypical chest pain 05/21/2018   Epigastric pain 05/21/2018   Baker's cyst of knee, right 05/07/2018   Perennial and seasonal allergic rhinitis 04/18/2018   Onychomycosis of toenail 03/04/2018   Hypercalcemia 09/17/2017   Left wrist pain 09/17/2017   Synovitis of left knee 05/22/2017   Diabetes ([place]) 05/02/2017   Trigger point of left shoulder region  03/27/2017   Taste disorder 02/22/2017   Anxiety and depression 02/12/2017   Caregiver with fatigue 01/17/2017   Psychosocial stressors 01/17/2017   Decreased sense of taste 01/15/2017   Family history of osteoporosis 11/16/2016   Atherosclerotic plaque 11/07/2016   Chronic or recurrent subluxation of peroneal tendon of left foot 09/13/2016   Osteoporosis 06/21/2016   Dizziness 05/16/2016   Arthritis of left shoulder region 01/17/2016   Ankylosing spondylitis ([place]) 11/08/2015   Incontinence 10/28/2015   Thoracic back pain 10/19/2015   Leg cramping 09/29/2015   Primary osteoarthritis of left shoulder 08/03/2015   Impingement syndrome of left shoulder region 09/22/2014   Interstitial cystitis 07/09/2014   Diverticulosis of colon without hemorrhage 07/08/2014   Carpal tunnel syndrome of left wrist 04/12/2014   Food allergy 03/06/2014   Renal calculi 01/30/2014   OSA (obstructive sleep apnea), moderate 01/14/2014   Allergic conjunctivitis 12/06/2013   History of Bell's palsy 02/26/2012   Vitamin D deficiency 06/27/2010   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   DRY MOUTH 06/27/2010   Gastroesophageal reflux disease 04/30/2009   History of colonic polyps 04/30/2009   Hyperlipidemia 08/21/2007   Essential hypertension 08/21/2007   Polyarthralgia 08/21/2007   Chronic lumbar radiculopathy 01/25/2007    Current Outpatient Medications on File Prior to Visit  Medication Sig Dispense Refill   acetaminophen (TYLENOL) 500 MG tablet Take 1,000 mg by mouth 3 (three) times daily as needed.      amLODipine (NORVASC) 2.5  MG tablet Take 1 tablet (2.5 mg total) by mouth daily. 90 tablet 2   Ascorbic Acid (VITAMIN C) 1000 MG tablet Take 1,000 mg by mouth 2 (two) times daily.      aspirin 81 MG tablet Take 81 mg by mouth daily.     azelastine (ASTELIN) 0.1 % nasal spray One to two sprays each nostril twice a day as needed. 30 mL 5   cetirizine (ZYRTEC) 10 MG tablet Take 10 mg by mouth daily.      Cholecalciferol (VITAMIN D-3) 25 MCG (1000 UT) CAPS Take 10,000 Units by mouth daily.      diclofenac Sodium (VOLTAREN) 1 % GEL Apply topically 4 (four) times daily as needed.      dicyclomine (BENTYL) 10 MG capsule Take 1 capsule (10 mg total) by mouth 3 (three) times daily as needed for spasms. 90 capsule 2   Difluprednate 0.05 % EMUL Place 1 drop into the left eye 4 (four) times daily. Start 2 days prior to surgery and use as directed after surgery. 5 mL 1   EPINEPHrine (EPIPEN 2-PAK) 0.3 mg/0.3 mL IJ SOAJ injection Use as directed for severe allergic reactions 2 each 1   estradiol (ESTRACE) 0.1 MG/GM vaginal cream estradiol 0.01% (0.1 mg/gram) vaginal cream     HYDROcodone-acetaminophen (NORCO/VICODIN) 5-325 MG tablet hydrocodone 5 mg-acetaminophen 325 mg tablet  TAKE 1 TABLET BY MOUTH THREE TIMES DAILY AS NEEDED FOR 7 DAYS     lipase/protease/amylase (CREON) 36000 UNITS CPEP capsule Take 2 capsules by mouth with each meal, 1 capsule by mouth with each snack 720 capsule 1   magnesium oxide (MAG-OX) 400 MG tablet Take 200 mg by mouth daily.     moxifloxacin (VIGAMOX) 0.5 % ophthalmic solution Place 1 drop into the left eye four times daily starting 2 days prior to surgery and place 2 drops the morning of surgery. 3 mL 1   Multiple Vitamins-Minerals (CENTRUM SILVER 50+WOMEN PO) Take 1 tablet by mouth daily.     naloxone (NARCAN) 4 MG/0.1ML LIQD nasal spray kit Narcan 4 mg/actuation nasal spray  PLACE 1 SPRAY INTO EACH NOSTRIL FOR SUSPECTED OPIOID OVERDOSE. MAY REPEAT 2 - 3 MINUTES WITH NEW BOTTLE IN OTHER NOSTRIL     olopatadine (PATANOL) 0.1 % ophthalmic solution Place 1 drop into both eyes 2 (two) times daily. 5 mL 11   Omega-3 Fatty Acids (FISH OIL BURP-LESS) 1000 MG CAPS Takes 1000 mg BID 60 capsule 0   omeprazole (PRILOSEC) 40 MG capsule TAKE 1 CAPSULE BY MOUTH ONCE DAILY 90 capsule 0   ondansetron (ZOFRAN) 4 MG tablet Take 1 tablet (4 mg total) by mouth every 8 (eight) hours as needed. 40  tablet 2   predniSONE (DELTASONE) 5 MG tablet Take 1 tablet (5 mg total) by mouth daily. 30 tablet 0   rosuvastatin (CRESTOR) 20 MG tablet Take 1 tablet (20 mg total) by mouth daily. 90 tablet 3   sertraline (ZOLOFT) 25 MG tablet TAKE 1 TABLET (25 MG TOTAL) BY MOUTH DAILY. 90 tablet 1   vitamin B-12 (CYANOCOBALAMIN) 1000 MCG tablet Take 1,000 mcg by mouth 2 (two) times daily.      vitamin E 400 UNIT capsule Take 400 Units by mouth daily.     zinc gluconate 50 MG tablet Take 50 mg by mouth daily.     No current facility-administered medications on file prior to visit.    Past Medical History:  Diagnosis Date   Allergy    seasonal   Ankylosing  spondylitis (HCC)    Anxiety    Arthritis    Bell palsy    CAD (coronary artery disease)    a. Coronary CT 10/2016 showed calcium score in the 59th perecntile, moderate CAD in RCA/PDA and narrowing of the mid to distal LAD, with negative FFR.   CTS (carpal tunnel syndrome)    Cystitis    Diabetes mellitus without complication ([place])    Diverticulitis    Diverticulosis of colon    Dry eye syndrome    Exocrine pancreatic insufficiency    Frozen shoulder    GERD (gastroesophageal reflux disease)    HLA B27 (HLA B27 positive)    Hx of colonic polyps    Hyperlipidemia    NMR 2005   Hypertension    Hypothyroidism    Incontinence    Internal hemorrhoids    Interstitial cystitis    Kidney stone    Menopause    Mild aortic insufficiency    Mild ascending aorta dilatation (HCC)    Osteoarthritis    Osteopenia    [facility] [address]   Pre-diabetes    PSVT (paroxysmal supraventricular tachycardia) ([place])    S/P total hysterectomy and bilateral salpingo-oophorectomy 04/12/2014   Sinusitis    Sleep apnea    c-pap   Subjective visual disturbance of both eyes 12/30/2013   Tubular adenoma of colon    Vitamin D deficiency    Xerostomia    and Xeroophthalmia    Past Surgical History:  Procedure Laterality Date   BLADDER SURGERY      for incontinence   CARPAL TUNNEL RELEASE Left 12/22/2014   Procedure: LEFT CARPAL TUNNEL RELEASE;  Surgeon: Daryll Brod, MD;  Location: [facility];  Service: Orthopedics;  Laterality: Left;   CARPAL TUNNEL RELEASE Right 02/18/2019   Procedure: RIGHT CARPAL TUNNEL RELEASE;  Surgeon: Daryll Brod, MD;  Location: [facility];  Service: Orthopedics;  Laterality: Right;   COLONOSCOPY W/ POLYPECTOMY     adenomatous poyp 04-2008   colonscopy     tics 10-2002   CYSTOSCOPY WITH RETROGRADE PYELOGRAM, URETEROSCOPY AND STENT PLACEMENT Left 09/01/2013   Procedure: CYSTOSCOPY WITH RETROGRADE PYELOGRAM, AND LEFT STENT PLACEMENT;  Surgeon: Molli Hazard, MD;  Location: [facility];  Service: Urology;  Laterality: Left;   elevated LFTs     due to [place]   FINGER SURGERY Left 2004   2nd finger   G 1 P 1     interstitial cystitis     with clot in catheter   PARTIAL HYSTERECTOMY     with one ovary left   SHOULDER ARTHROSCOPY Left    TOTAL ABDOMINAL HYSTERECTOMY W/ BILATERAL SALPINGOOPHORECTOMY     dysfunctional menses    Social History   Socioeconomic History   Marital status: Married    Spouse name: Not on file   Number of children: 1   Years of education: BS   Highest education level: Not on file  Occupational History   Occupation: retired   Occupation: Pharmacist, hospital    Comment: Kindergarten  Tobacco Use   Smoking status: Never   Smokeless tobacco: Never  Vaping Use   Vaping Use: Never used  Substance and Sexual Activity   Alcohol use: Yes    Comment: social   Drug use: No   Sexual activity: Not Currently  Other Topics Concern   Not on file  Social History Narrative   Lives at home with husband.   No daily use of  caffeine.   Right-handed.         Social Determinants of Health   Financial Resource Strain: Low Risk    Difficulty of Paying Living Expenses: Not hard at all  Food Insecurity: No Food Insecurity   Worried About Charity fundraiser in the  Last Year: Never true   [place] in the Last Year: Never true  Transportation Needs: No Transportation Needs   Lack of Transportation (Medical): No   Lack of Transportation (Non-Medical): No  Physical Activity: Sufficiently Active   Days of Exercise per Week: 5 days   Minutes of Exercise per Session: 30 min  Stress: No Stress Concern Present   Feeling of Stress : Not at all  Social Connections: Socially Integrated   Frequency of Communication with Friends and Family: More than three times a week   Frequency of Social Gatherings with Friends and Family: Once a week   Attends Religious Services: More than 4 times per year   Active Member of [employer] or Organizations: Yes   Attends Music therapist: More than 4 times per year   Marital Status: Married    Family History  Problem Relation Age of Onset   Coronary artery disease Mother    Hypertension Mother    Asthma Mother    Dementia Mother    Stroke Mother        mini cva   Diabetes Father    Cirrhosis Father        non alcoholic   Colon cancer Sister 65   Allergic rhinitis Sister    Allergic rhinitis Sister    Food Allergy Sister    Renal cancer Paternal Aunt        x 2   Coronary artery disease Maternal Grandfather    Breast cancer Maternal Aunt    Diabetes Paternal Aunt    Diabetes Paternal Grandmother    Esophageal cancer Neg Hx    Stomach cancer Neg Hx     Review of Systems  Constitutional:  Negative for fever.  HENT:  Negative for congestion and sore throat.   Respiratory:  Positive for cough (occ). Negative for shortness of breath and wheezing.   Cardiovascular:  Positive for leg swelling. Negative for chest pain and palpitations.  Gastrointestinal:  Positive for abdominal pain (intermittent cramping), nausea and vomiting (once). Negative for constipation and diarrhea.  Neurological:  Positive for light-headedness and headaches (occ).  Psychiatric/Behavioral:  Negative for sleep disturbance.        Objective:   Vitals:   06/03/21 1029  BP: 130/70  Pulse: 87  Temp: 98.2 F (36.8 C)  SpO2: 98%   BP Readings from Last 3 Encounters:  06/03/21 130/70  04/07/21 (!) 120/100  03/16/21 118/80   Wt Readings from Last 3 Encounters:  06/03/21 153 lb (69.4 kg)  04/07/21 154 lb (69.9 kg)  03/16/21 154 lb (69.9 kg)   Body mass index is 27.98 kg/m.   Physical Exam    Constitutional: Appears well-developed and well-nourished. No distress.  HENT:  Head: Normocephalic and atraumatic.  Neck: Neck supple. No tracheal deviation present. No thyromegaly present.  No cervical lymphadenopathy Cardiovascular: Normal rate, regular rhythm and normal heart sounds.   No murmur heard. No carotid bruit .  No edema Pulmonary/Chest: Effort normal and breath sounds normal. No respiratory distress. No has no wheezes. No rales.  Skin: Skin is warm and dry. Not diaphoretic.  Psychiatric: Normal mood and affect. Behavior is normal.  Assessment & Plan:   Flu immunization administered today.     See Problem List for Assessment and Plan of chronic medical problems.

## 2021-06-02 NOTE — Patient Instructions (Addendum)
    Flu immunization administered today.     Blood work was ordered.     Medications changes include :   use zofran as needed for nausea and the dicyclomine for abdominal cramping     Please followup in 6 months

## 2021-06-03 ENCOUNTER — Ambulatory Visit (INDEPENDENT_AMBULATORY_CARE_PROVIDER_SITE_OTHER): Payer: Medicare Other | Admitting: Internal Medicine

## 2021-06-03 ENCOUNTER — Other Ambulatory Visit: Payer: Self-pay

## 2021-06-03 ENCOUNTER — Other Ambulatory Visit (HOSPITAL_BASED_OUTPATIENT_CLINIC_OR_DEPARTMENT_OTHER): Payer: Self-pay

## 2021-06-03 VITALS — BP 130/70 | HR 87 | Temp 98.2°F | Ht 62.0 in | Wt 153.0 lb

## 2021-06-03 DIAGNOSIS — E559 Vitamin D deficiency, unspecified: Secondary | ICD-10-CM

## 2021-06-03 DIAGNOSIS — F419 Anxiety disorder, unspecified: Secondary | ICD-10-CM

## 2021-06-03 DIAGNOSIS — I2510 Atherosclerotic heart disease of native coronary artery without angina pectoris: Secondary | ICD-10-CM | POA: Diagnosis not present

## 2021-06-03 DIAGNOSIS — F32A Depression, unspecified: Secondary | ICD-10-CM

## 2021-06-03 DIAGNOSIS — E782 Mixed hyperlipidemia: Secondary | ICD-10-CM

## 2021-06-03 DIAGNOSIS — I10 Essential (primary) hypertension: Secondary | ICD-10-CM | POA: Diagnosis not present

## 2021-06-03 DIAGNOSIS — E119 Type 2 diabetes mellitus without complications: Secondary | ICD-10-CM

## 2021-06-03 DIAGNOSIS — K529 Noninfective gastroenteritis and colitis, unspecified: Secondary | ICD-10-CM | POA: Diagnosis not present

## 2021-06-03 LAB — VITAMIN D 25 HYDROXY (VIT D DEFICIENCY, FRACTURES): VITD: 63.14 ng/mL (ref 30.00–100.00)

## 2021-06-03 LAB — CBC WITH DIFFERENTIAL/PLATELET
Basophils Absolute: 0 10*3/uL (ref 0.0–0.1)
Basophils Relative: 0.5 % (ref 0.0–3.0)
Eosinophils Absolute: 0.2 10*3/uL (ref 0.0–0.7)
Eosinophils Relative: 4.3 % (ref 0.0–5.0)
HCT: 34.5 % — ABNORMAL LOW (ref 36.0–46.0)
Hemoglobin: 11.6 g/dL — ABNORMAL LOW (ref 12.0–15.0)
Lymphocytes Relative: 31.7 % (ref 12.0–46.0)
Lymphs Abs: 1.7 10*3/uL (ref 0.7–4.0)
MCHC: 33.5 g/dL (ref 30.0–36.0)
MCV: 88.2 fl (ref 78.0–100.0)
Monocytes Absolute: 0.4 10*3/uL (ref 0.1–1.0)
Monocytes Relative: 6.8 % (ref 3.0–12.0)
Neutro Abs: 3 10*3/uL (ref 1.4–7.7)
Neutrophils Relative %: 56.7 % (ref 43.0–77.0)
Platelets: 238 10*3/uL (ref 150.0–400.0)
RBC: 3.91 Mil/uL (ref 3.87–5.11)
RDW: 12.6 % (ref 11.5–15.5)
WBC: 5.4 10*3/uL (ref 4.0–10.5)

## 2021-06-03 LAB — HEMOGLOBIN A1C: Hgb A1c MFr Bld: 6.3 % (ref 4.6–6.5)

## 2021-06-03 LAB — LIPID PANEL
Cholesterol: 101 mg/dL (ref 0–200)
HDL: 53.8 mg/dL (ref >39.00)
LDL Cholesterol: 23 mg/dL (ref 0–99)
NonHDL: 47.32
Total CHOL/HDL Ratio: 2
Triglycerides: 121 mg/dL (ref 0.0–149.0)
VLDL: 24.2 mg/dL (ref 0.0–40.0)

## 2021-06-03 LAB — COMPREHENSIVE METABOLIC PANEL
ALT: 19 U/L (ref 0–35)
AST: 21 U/L (ref 0–37)
Albumin: 4.2 g/dL (ref 3.5–5.2)
Alkaline Phosphatase: 90 U/L (ref 39–117)
BUN: 15 mg/dL (ref 6–23)
CO2: 26 mEq/L (ref 19–32)
Calcium: 9.4 mg/dL (ref 8.4–10.5)
Chloride: 113 mEq/L — ABNORMAL HIGH (ref 96–112)
Creatinine, Ser: 1.06 mg/dL (ref 0.40–1.20)
GFR: 50.74 mL/min — ABNORMAL LOW (ref >60.00)
Glucose, Bld: 91 mg/dL (ref 70–99)
Potassium: 4 mEq/L (ref 3.5–5.1)
Sodium: 143 mEq/L (ref 135–145)
Total Bilirubin: 0.4 mg/dL (ref 0.2–1.2)
Total Protein: 7.1 g/dL (ref 6.0–8.3)

## 2021-06-03 LAB — MICROALBUMIN / CREATININE URINE RATIO
Creatinine,U: 116 mg/dL
Microalb Creat Ratio: 1.5 mg/g (ref 0.0–30.0)
Microalb, Ur: 1.7 mg/dL (ref 0.0–1.9)

## 2021-06-03 MED ORDER — ONDANSETRON HCL 4 MG PO TABS
4.0000 mg | ORAL_TABLET | Freq: Three times a day (TID) | ORAL | 2 refills | Status: DC | PRN
  Filled 2021-06-03: qty 40, 14d supply, fill #0

## 2021-06-03 MED ORDER — DICYCLOMINE HCL 10 MG PO CAPS
10.0000 mg | ORAL_CAPSULE | Freq: Three times a day (TID) | ORAL | 2 refills | Status: DC | PRN
  Filled 2021-06-03: qty 90, 30d supply, fill #0

## 2021-06-03 NOTE — Assessment & Plan Note (Signed)
Chronic Check lipid panel  Continue crestor 20 mg daily

## 2021-06-03 NOTE — Assessment & Plan Note (Signed)
Chronic Diet controlled a1c today

## 2021-06-03 NOTE — Assessment & Plan Note (Signed)
Chronic Blood pressure well controlled CMP Continue current medications-managed by cardiology

## 2021-06-03 NOTE — Assessment & Plan Note (Signed)
Acute Symptoms c/w possible gastroenteritis Start zofran 4 mg Q 8 hr for nausea Start dicyclomine 10 mg tid prn for abdominal cramping Call if no improvement

## 2021-06-03 NOTE — Assessment & Plan Note (Signed)
Chronic Taking vitamin D daily Check vitamin D level  

## 2021-06-03 NOTE — Assessment & Plan Note (Signed)
Chronic Controlled now w/o medication Monitor off medication

## 2021-06-16 ENCOUNTER — Ambulatory Visit: Payer: Medicare Other | Admitting: Family Medicine

## 2021-06-27 ENCOUNTER — Telehealth: Payer: Self-pay | Admitting: Internal Medicine

## 2021-06-27 ENCOUNTER — Encounter: Payer: Self-pay | Admitting: Family Medicine

## 2021-06-27 NOTE — Telephone Encounter (Addendum)
Patient daughter also called in about medical issue her mother is having. They would not disclose the issue. I let them know someone will be calling back and needed to know what the issue is to try to have a solution prior to returning call. Patient daughter states she will just come to the office and hung up the phone.

## 2021-06-27 NOTE — Telephone Encounter (Signed)
Inbound call from patient need medication refill for omeprazole 40mg  90 [facility].

## 2021-06-28 ENCOUNTER — Encounter: Payer: Self-pay | Admitting: Internal Medicine

## 2021-06-30 ENCOUNTER — Ambulatory Visit (HOSPITAL_COMMUNITY): Payer: Medicare Other | Attending: Cardiology

## 2021-06-30 ENCOUNTER — Other Ambulatory Visit: Payer: Self-pay

## 2021-06-30 DIAGNOSIS — I77810 Thoracic aortic ectasia: Secondary | ICD-10-CM

## 2021-06-30 DIAGNOSIS — E785 Hyperlipidemia, unspecified: Secondary | ICD-10-CM | POA: Diagnosis not present

## 2021-06-30 DIAGNOSIS — I10 Essential (primary) hypertension: Secondary | ICD-10-CM

## 2021-06-30 DIAGNOSIS — I471 Supraventricular tachycardia: Secondary | ICD-10-CM

## 2021-06-30 DIAGNOSIS — I2510 Atherosclerotic heart disease of native coronary artery without angina pectoris: Secondary | ICD-10-CM

## 2021-06-30 LAB — ECHOCARDIOGRAM COMPLETE
Area-P 1/2: 3.17 cm2
P 1/2 time: 859 msec
S' Lateral: 2.7 cm

## 2021-07-05 ENCOUNTER — Telehealth: Payer: Self-pay | Admitting: Cardiology

## 2021-07-05 NOTE — Telephone Encounter (Signed)
pt on the line calling in for her results.. I saw on your note that you saw that the pt was able to see the notes via my chart but she doesn't understand the medical jargon and would like for someone to explain this to her

## 2021-07-06 ENCOUNTER — Other Ambulatory Visit: Payer: Self-pay

## 2021-07-06 ENCOUNTER — Encounter: Payer: Self-pay | Admitting: Family Medicine

## 2021-07-06 ENCOUNTER — Other Ambulatory Visit (HOSPITAL_BASED_OUTPATIENT_CLINIC_OR_DEPARTMENT_OTHER): Payer: Self-pay

## 2021-07-06 ENCOUNTER — Ambulatory Visit (INDEPENDENT_AMBULATORY_CARE_PROVIDER_SITE_OTHER): Payer: Medicare Other | Admitting: Family Medicine

## 2021-07-06 VITALS — BP 108/72 | HR 88 | Ht 62.0 in

## 2021-07-06 DIAGNOSIS — M2550 Pain in unspecified joint: Secondary | ICD-10-CM

## 2021-07-06 DIAGNOSIS — M459 Ankylosing spondylitis of unspecified sites in spine: Secondary | ICD-10-CM

## 2021-07-06 DIAGNOSIS — I2510 Atherosclerotic heart disease of native coronary artery without angina pectoris: Secondary | ICD-10-CM

## 2021-07-06 DIAGNOSIS — M62838 Other muscle spasm: Secondary | ICD-10-CM | POA: Diagnosis not present

## 2021-07-06 MED ORDER — TIZANIDINE HCL 4 MG PO TABS
4.0000 mg | ORAL_TABLET | Freq: Four times a day (QID) | ORAL | 1 refills | Status: DC | PRN
  Filled 2021-07-06: qty 30, 8d supply, fill #0

## 2021-07-06 NOTE — Patient Instructions (Addendum)
Thank you for coming in today.   I've referred you to Physical Therapy.  Let us know if you don't hear from them in one week.   Muscle Relaxer at bedtime mostly.    Heating Pad.   Recheck with Dr Tamala Julian or Dr Georgina Snell as needed. Dr Glennon Mac can do the spinal adjustments as well.   I have also referred you to the new rheumatologist at [place]: This is helpful for muscle pain and spasm.   Search and Purchase a TENS 7000 2nd edition at  www.tenspros.com or www.[employer].com It should be less than $30.     TENS unit instructions: Do not shower or bathe with the unit on Turn the unit off before removing electrodes or batteries If the electrodes lose stickiness add a drop of water to the electrodes after they are disconnected from the unit and place on plastic sheet. If you continued to have difficulty, call the TENS unit company to purchase more electrodes. Do not apply lotion on the skin area prior to use. Make sure the skin is clean and dry as this will help prolong the life of the electrodes. After use, always check skin for unusual red areas, rash or other skin difficulties. If there are any skin problems, does not apply electrodes to the same area. Never remove the electrodes from the unit by pulling the wires. Do not use the TENS unit or electrodes other than as directed. Do not change electrode placement without consultating your therapist or physician. Keep 2 fingers with between each electrode. Wear time ratio is 2:1, on to off times.    For example on for 30 minutes off for 15 minutes and then on for 30 minutes off for 15 minutes

## 2021-07-06 NOTE — Progress Notes (Signed)
I, Peterson Lombard, LAT, ATC acting as a scribe for Lynne Leader, MD.  [name] is a 75 y.o. female who presents to [facility] today for polyarthralgia, esp neck and bilat legs worsening since Saturday, 12/3. Pt was previously seen by Dr. Tamala Julian on 04/07/21 for polyarthralgia. Today, pt reports increased pain w/ cervical rotation. Pt also c/o R shoulder. Pt locates pain to bilat sides of her neck down to medial border of bilat scapula. No UE numbness/tingling. Pt c/o having cramping pains in both thighs and calf.   She is under the care of a rheumatologist at [facility] in [place].  There is some uncertainty of the exact rheumatologic diagnosis whether or not she should be taking medication or not.  She has general diffuse arthralgias and myalgias.  Previously she was recommended to take prednisone and describes a time where she was about to get started on a DMARD but then her rheumatologist changed and that plan was abandoned.  Treatments tried: ice, heat, IBU, Tylenol  Dx imaging: 04/07/21 L hip XR  02/02/21 L foot & ankle XR  01/06/21 Bilat standing knees  Pertinent review of systems: No fevers or chills  Relevant historical information: Prediabetes or diabetes.   Exam:  BP 108/72   Pulse 88   Ht 5\' 2"  (1.575 m)   SpO2 96%   BMI 27.98 kg/m  General: Well Developed, well nourished, and in no acute distress.   MSK: C-spine: Normal-appearing Nontender midline. Tender palpation cervical paraspinal musculature. Decreased cervical motion. Upper extremity strength reflexes and sensation are equal and normal throughout.    Lab and Radiology Results  Component     Latest Ref Rng & Units 12/24/2018  Sodium     135 - 145 mEq/L 138  Potassium     3.5 - 5.1 mEq/L 3.6  Chloride     96 - 112 mEq/L 103  CO2     19 - 32 mEq/L 26  Glucose     70 - 99 mg/dL 129 (H)  BUN     6 - 23 mg/dL 24 (H)  Creatinine     0.40 - 1.20 mg/dL 1.17  Total  Bilirubin     0.2 - 1.2 mg/dL 0.3  Alkaline Phosphatase     39 - 117 U/L 86  AST     0 - 37 U/L 29  ALT     0 - 35 U/L 34  Total Protein     6.0 - 8.3 g/dL 7.2  Albumin     3.5 - 5.2 g/dL 4.4  Calcium     8.4 - 10.5 mg/dL 9.9  GFR     >60.00 mL/min 45.11 (L)  ANA Titer 1     titer 1:40 (H)  ANA Pattern 1      Mitotic (A)  Cyclic Citrullin Peptide Ab     UNITS <16  CRP     0.5 - 20.0 mg/dL <1.0  RA Latex Turbid.     <14 IU/mL 17 (H)  Sed Rate     0 - 30 mm/hr 9  TSH     0.35 - 4.50 uIU/mL 2.68      Assessment and Plan: 75 y.o. female with neck pain.  Patient has developed acute cervical paraspinal neck pain thought to be muscle spasm and dysfunction.  This should be treated with physical therapy heating pad TENS unit and and muscle relaxers in addition to Tylenol and ibuprofen.  Discussed warning  signs or symptoms.  Recheck back with Dr. Tamala Julian or myself as needed.  Additionally she has signs or symptoms concerning for possible seronegative rheumatoid arthritis or perhaps some other rheumatologic condition.  She is currently under the care of a rheumatologist and neuro.  Unfortunately I do not have access to medical records as [facility] medical records do not link with mine.  She would like to transition her rheumatologic care to someone more local to [facility].  Will refer to rheumatology at [facility] which is certainly local and should be available.   PDMP not reviewed this encounter. Orders Placed This Encounter  Procedures   Ambulatory referral to Physical Therapy    Referral Priority:   Routine    Referral Type:   Physical Medicine    Referral Reason:   Specialty Services Required    Requested Specialty:   Physical Therapy    Number of Visits Requested:   1   Ambulatory referral to Rheumatology    Referral Priority:   Routine    Referral Type:   Consultation    Referral Reason:   Specialty Services Required    Requested Specialty:    Rheumatology    Number of Visits Requested:   1   Meds ordered this encounter  Medications   tiZANidine (ZANAFLEX) 4 MG tablet    Sig: Take 1 tablet (4 mg total) by mouth every 6 (six) hours as needed for muscle spasms.    Dispense:  30 tablet    Refill:  1     Discussed warning signs or symptoms. Please see discharge instructions. Patient expresses understanding.   The above documentation has been reviewed and is accurate and complete Lynne Leader, M.D.   Total encounter time 30 minutes including face-to-face time with the patient and, reviewing past medical record, and charting on the date of service.   Treatment plan and options

## 2021-07-06 NOTE — Telephone Encounter (Signed)
Returned the call to pt.  She has been made aware that per Melina Copa, PA-C, her Echocardiogram results are:  Please let patient know echo overall similar to prior study in 06/2020. The leaking of aortic valve remains mild-moderate and ascending aorta size remains stable. F/u as previously recommended.  Pt was thankful for the return call.  She did want to see Dayna before she sees Dr. Johney Frame in February, so she was scheduled for 07/11/21 at 10:05.  Pt was very appreciative of the call.

## 2021-07-07 ENCOUNTER — Encounter: Payer: Self-pay | Admitting: Internal Medicine

## 2021-07-07 ENCOUNTER — Ambulatory Visit: Payer: Medicare Other | Admitting: Internal Medicine

## 2021-07-07 DIAGNOSIS — Z20822 Contact with and (suspected) exposure to covid-19: Secondary | ICD-10-CM | POA: Diagnosis not present

## 2021-07-07 NOTE — Progress Notes (Signed)
Subjective:    Patient ID: [name], female    DOB: 1946-07-30, 75 y.o.   MRN: [id number]  This visit occurred during the SARS-CoV-2 public health emergency.  Safety protocols were in place, including screening questions prior to the visit, additional usage of staff PPE, and extensive cleaning of exam room while observing appropriate contact time as indicated for disinfecting solutions.    HPI The patient is here for an acute visit.  Neck mm spasms - started 5 days ago.  She has a lot of pain.  She has been using heat and ice. She will do PT.  Both sides of her neck are painful and in spasm.  She did see Dr. Kerby Less has not taken the tizanidine.  She is concerned that she did have an x-ray done and wonders if she should have them done.  ? Ritta Slot - she saw a white coating on her tongue and a couple of grayish spots on each side of the tongue.  She brushes her teeth twice a day at least.  She is brushing her tongue.  It looks a little better.  She denies recent abx, steroids.  She is taking her reflux medication and denies any flare of her GERD   Reviewed Dr. Clovis Riley note  Medications and allergies reviewed with patient and updated if appropriate.  Patient Active Problem List   Diagnosis Date Noted   Trapezius muscle spasm 07/08/2021   Tongue coating 07/08/2021   Gastroenteritis 06/03/2021   Left foot pain 02/03/2021   Degenerative arthritis of knee, bilateral 11/24/2020   Right ankle effusion 05/10/2020   Elevated alkaline phosphatase level 04/20/2020   Neuralgia 04/20/2020   Swelling 12/19/2018   Hair loss 11/04/2018   Spinal stenosis, 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/13/2018   Atypical chest pain 05/21/2018   Epigastric pain 05/21/2018   Baker's cyst of knee, right 05/07/2018   Perennial and seasonal allergic rhinitis 04/18/2018   Onychomycosis of toenail 03/04/2018   Hypercalcemia 09/17/2017   Left wrist pain 09/17/2017   Synovitis of left knee  05/22/2017   Diabetes ([place]) 05/02/2017   Trigger point of left shoulder region 03/27/2017   Taste disorder 02/22/2017   Anxiety and depression 02/12/2017   Caregiver with fatigue 01/17/2017   Psychosocial stressors 01/17/2017   Decreased sense of taste 01/15/2017   Family history of osteoporosis 11/16/2016   Atherosclerotic plaque 11/07/2016   Chronic or recurrent subluxation of peroneal tendon of left foot 09/13/2016   Osteoporosis 06/21/2016   Dizziness 05/16/2016   Arthritis of left shoulder region 01/17/2016   Ankylosing spondylitis ([place]) 11/08/2015   Incontinence 10/28/2015   Thoracic back pain 10/19/2015   Leg cramping 09/29/2015   Primary osteoarthritis of left shoulder 08/03/2015   Impingement syndrome of left shoulder region 09/22/2014   Interstitial cystitis 07/09/2014   Diverticulosis of colon without hemorrhage 07/08/2014   Carpal tunnel syndrome of left wrist 04/12/2014   Food allergy 03/06/2014   Renal calculi 01/30/2014   OSA (obstructive sleep apnea), moderate 01/14/2014   Allergic conjunctivitis 12/06/2013   History of Bell's palsy 02/26/2012   Vitamin D deficiency 06/27/2010   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   DRY MOUTH 06/27/2010   Gastroesophageal reflux disease 04/30/2009   History of colonic polyps 04/30/2009   Hyperlipidemia 08/21/2007   Essential hypertension 08/21/2007   Polyarthralgia 08/21/2007   Chronic lumbar radiculopathy 01/25/2007    Current Outpatient Medications on File Prior to Visit  Medication Sig Dispense Refill   acetaminophen (TYLENOL)  500 MG tablet Take 1,000 mg by mouth 3 (three) times daily as needed.      amLODipine (NORVASC) 2.5 MG tablet Take 1 tablet (2.5 mg total) by mouth daily. 90 tablet 2   Ascorbic Acid (VITAMIN C) 1000 MG tablet Take 1,000 mg by mouth 2 (two) times daily.      aspirin 81 MG tablet Take 81 mg by mouth daily.     azelastine (ASTELIN) 0.1 % nasal spray One to two sprays each nostril twice a day as needed. 30  mL 5   cetirizine (ZYRTEC) 10 MG tablet Take 10 mg by mouth daily.     Cholecalciferol (VITAMIN D-3) 25 MCG (1000 UT) CAPS Take 10,000 Units by mouth daily.      diclofenac Sodium (VOLTAREN) 1 % GEL Apply topically 4 (four) times daily as needed.      dicyclomine (BENTYL) 10 MG capsule Take 1 capsule (10 mg total) by mouth 3 (three) times daily as needed for spasms. 90 capsule 2   Difluprednate 0.05 % EMUL Place 1 drop into the left eye 4 (four) times daily. Start 2 days prior to surgery and use as directed after surgery. 5 mL 1   EPINEPHrine (EPIPEN 2-PAK) 0.3 mg/0.3 mL IJ SOAJ injection Use as directed for severe allergic reactions 2 each 1   estradiol (ESTRACE) 0.1 MG/GM vaginal cream estradiol 0.01% (0.1 mg/gram) vaginal cream     HYDROcodone-acetaminophen (NORCO/VICODIN) 5-325 MG tablet hydrocodone 5 mg-acetaminophen 325 mg tablet  TAKE 1 TABLET BY MOUTH THREE TIMES DAILY AS NEEDED FOR 7 DAYS     lipase/protease/amylase (CREON) 36000 UNITS CPEP capsule Take 2 capsules by mouth with each meal, 1 capsule by mouth with each snack 720 capsule 1   magnesium oxide (MAG-OX) 400 MG tablet Take 200 mg by mouth daily.     moxifloxacin (VIGAMOX) 0.5 % ophthalmic solution Place 1 drop into the left eye four times daily starting 2 days prior to surgery and place 2 drops the morning of surgery. 3 mL 1   Multiple Vitamins-Minerals (CENTRUM SILVER 50+WOMEN PO) Take 1 tablet by mouth daily.     naloxone (NARCAN) 4 MG/0.1ML LIQD nasal spray kit Narcan 4 mg/actuation nasal spray  PLACE 1 SPRAY INTO EACH NOSTRIL FOR SUSPECTED OPIOID OVERDOSE. MAY REPEAT 2 - 3 MINUTES WITH NEW BOTTLE IN OTHER NOSTRIL     olopatadine (PATANOL) 0.1 % ophthalmic solution Place 1 drop into both eyes 2 (two) times daily. 5 mL 11   Omega-3 Fatty Acids (FISH OIL BURP-LESS) 1000 MG CAPS Takes 1000 mg BID 60 capsule 0   omeprazole (PRILOSEC) 40 MG capsule TAKE 1 CAPSULE BY MOUTH ONCE DAILY 90 capsule 0   ondansetron (ZOFRAN) 4 MG tablet  Take 1 tablet (4 mg total) by mouth every 8 (eight) hours as needed. 40 tablet 2   tiZANidine (ZANAFLEX) 4 MG tablet Take 1 tablet (4 mg total) by mouth every 6 (six) hours as needed for muscle spasms. 30 tablet 1   vitamin B-12 (CYANOCOBALAMIN) 1000 MCG tablet Take 1,000 mcg by mouth 2 (two) times daily.      vitamin E 400 UNIT capsule Take 400 Units by mouth daily.     zinc gluconate 50 MG tablet Take 50 mg by mouth daily.     rosuvastatin (CRESTOR) 20 MG tablet Take 1 tablet (20 mg total) by mouth daily. 90 tablet 3   No current facility-administered medications on file prior to visit.    Past Medical History:  Diagnosis   Date   Allergy    seasonal   Ankylosing spondylitis (HCC)    Anxiety    Arthritis    Bell palsy    CAD (coronary artery disease)    a. Coronary CT 10/2016 showed calcium score in the 59th perecntile, moderate CAD in RCA/PDA and narrowing of the mid to distal LAD, with negative FFR.   CTS (carpal tunnel syndrome)    Cystitis    Diabetes mellitus without complication ([place])    Diverticulitis    Diverticulosis of colon    Dry eye syndrome    Exocrine pancreatic insufficiency    Frozen shoulder    GERD (gastroesophageal reflux disease)    HLA B27 (HLA B27 positive)    Hx of colonic polyps    Hyperlipidemia    NMR 2005   Hypertension    Hypothyroidism    Incontinence    Internal hemorrhoids    Interstitial cystitis    Kidney stone    Menopause    Mild aortic insufficiency    Mild ascending aorta dilatation (HCC)    Osteoarthritis    Osteopenia    [facility] [address]   Pre-diabetes    PSVT (paroxysmal supraventricular tachycardia) ([place])    S/P total hysterectomy and bilateral salpingo-oophorectomy 04/12/2014   Sinusitis    Sleep apnea    c-pap   Subjective visual disturbance of both eyes 12/30/2013   Tubular adenoma of colon    Vitamin D deficiency    Xerostomia    and Xeroophthalmia    Past Surgical History:  Procedure Laterality  Date   BLADDER SURGERY     for incontinence   CARPAL TUNNEL RELEASE Left 12/22/2014   Procedure: LEFT CARPAL TUNNEL RELEASE;  Surgeon: Daryll Brod, MD;  Location: [facility];  Service: Orthopedics;  Laterality: Left;   CARPAL TUNNEL RELEASE Right 02/18/2019   Procedure: RIGHT CARPAL TUNNEL RELEASE;  Surgeon: Daryll Brod, MD;  Location: [facility];  Service: Orthopedics;  Laterality: Right;   COLONOSCOPY W/ POLYPECTOMY     adenomatous poyp 04-2008   colonscopy     tics 10-2002   CYSTOSCOPY WITH RETROGRADE PYELOGRAM, URETEROSCOPY AND STENT PLACEMENT Left 09/01/2013   Procedure: CYSTOSCOPY WITH RETROGRADE PYELOGRAM, AND LEFT STENT PLACEMENT;  Surgeon: Molli Hazard, MD;  Location: [facility];  Service: Urology;  Laterality: Left;   elevated LFTs     due to [place]   FINGER SURGERY Left 2004   2nd finger   G 1 P 1     interstitial cystitis     with clot in catheter   PARTIAL HYSTERECTOMY     with one ovary left   SHOULDER ARTHROSCOPY Left    TOTAL ABDOMINAL HYSTERECTOMY W/ BILATERAL SALPINGOOPHORECTOMY     dysfunctional menses    Social History   Socioeconomic History   Marital status: Married    Spouse name: Not on file   Number of children: 1   Years of education: BS   Highest education level: Not on file  Occupational History   Occupation: retired   Occupation: Pharmacist, hospital    Comment: Kindergarten  Tobacco Use   Smoking status: Never   Smokeless tobacco: Never  Vaping Use   Vaping Use: Never used  Substance and Sexual Activity   Alcohol use: Yes    Comment: social   Drug use: No   Sexual activity: Not Currently  Other Topics Concern   Not on file  Social History Narrative  Lives at home with husband.   No daily use of caffeine.   Right-handed.         Social Determinants of Health   Financial Resource Strain: Low Risk    Difficulty of Paying Living Expenses: Not hard at all  Food Insecurity: No Food Insecurity   Worried About  Running Out of Food in the Last Year: Never true   Ran Out of Food in the Last Year: Never true  Transportation Needs: No Transportation Needs   Lack of Transportation (Medical): No   Lack of Transportation (Non-Medical): No  Physical Activity: Sufficiently Active   Days of Exercise per Week: 5 days   Minutes of Exercise per Session: 30 min  Stress: No Stress Concern Present   Feeling of Stress : Not at all  Social Connections: Socially Integrated   Frequency of Communication with Friends and Family: More than three times a week   Frequency of Social Gatherings with Friends and Family: Once a week   Attends Religious Services: More than 4 times per year   Active Member of Clubs or Organizations: Yes   Attends Club or Organization Meetings: More than 4 times per year   Marital Status: Married    Family History  Problem Relation Age of Onset   Coronary artery disease Mother    Hypertension Mother    Asthma Mother    Dementia Mother    Stroke Mother        mini cva   Diabetes Father    Cirrhosis Father        non alcoholic   Colon cancer Sister 67   Allergic rhinitis Sister    Allergic rhinitis Sister    Food Allergy Sister    Renal cancer Paternal Aunt        x 2   Coronary artery disease Maternal Grandfather    Breast cancer Maternal Aunt    Diabetes Paternal Aunt    Diabetes Paternal Grandmother    Esophageal cancer Neg Hx    Stomach cancer Neg Hx     Review of Systems     Objective:   Vitals:   07/08/21 0758  BP: 122/80  Pulse: (!) 52  Temp: 98.3 F (36.8 C)  SpO2: 98%   BP Readings from Last 3 Encounters:  07/08/21 122/80  07/06/21 108/72  06/03/21 130/70   Wt Readings from Last 3 Encounters:  07/08/21 154 lb (69.9 kg)  06/03/21 153 lb (69.4 kg)  04/07/21 154 lb (69.9 kg)   Body mass index is 28.17 kg/m.   Physical Exam Constitutional:      General: She is not in acute distress.    Appearance: Normal appearance. She is not ill-appearing.   HENT:     Head: Normocephalic and atraumatic.     Mouth/Throat:     Mouth: Mucous membranes are moist.     Comments: Spotty white/brown coating on tongue-improved per patient.  No erythema or exudate Musculoskeletal:     Comments: Bilateral trapezius muscle tightness, tenderness associated with decreased range of motion of neck and pain with movement.  Skin:    General: Skin is warm and dry.  Neurological:     Mental Status: She is alert.       DG Cervical Spine Complete CLINICAL DATA:  Trapezius muscle spasm. Neck pain. Patient reports limited range of motion.  EXAM: CERVICAL SPINE - COMPLETE 4+ VIEW  COMPARISON:  None available.  FINDINGS: The bones are subjectively under mineralized. There is minimal stepwise   anterolisthesis of C4 on C5 and C5 on C6. Disc space narrowing and endplate spurring at C5-C6, lesser C6-C7. Mild-to-moderate multilevel facet hypertrophy. No high-grade bony neural foraminal narrowing. No evidence of fracture, focal bone lesion or bone destruction. No prevertebral soft tissue thickening. Lung apices are clear.  IMPRESSION: 1. Lower cervical degenerative disc disease. 2. Multilevel facet hypertrophy. Grade 1 anterolisthesis of C4 on C5 and C5 on C6 is likely facet mediated. 3. Subjective osteopenia/osteoporosis.  Electronically Signed   By: Melanie  Sanford M.D.   On: 07/08/2021 11:21     Assessment & Plan:     I spent 20 minutes dedicated to the care of this patient on the date of this encounter including review of recent labs, imaging and procedures, speciality notes, obtaining history, communicating with the patient, ordering medications, tests, and documenting clinical information in the EHR   See Problem List for Assessment and Plan of chronic medical problems.       

## 2021-07-08 ENCOUNTER — Other Ambulatory Visit (INDEPENDENT_AMBULATORY_CARE_PROVIDER_SITE_OTHER): Payer: Medicare Other

## 2021-07-08 ENCOUNTER — Other Ambulatory Visit (HOSPITAL_BASED_OUTPATIENT_CLINIC_OR_DEPARTMENT_OTHER): Payer: Self-pay

## 2021-07-08 ENCOUNTER — Ambulatory Visit (INDEPENDENT_AMBULATORY_CARE_PROVIDER_SITE_OTHER): Payer: Medicare Other

## 2021-07-08 ENCOUNTER — Ambulatory Visit (INDEPENDENT_AMBULATORY_CARE_PROVIDER_SITE_OTHER): Payer: Medicare Other | Admitting: Physician Assistant

## 2021-07-08 ENCOUNTER — Other Ambulatory Visit: Payer: Self-pay

## 2021-07-08 ENCOUNTER — Encounter: Payer: Self-pay | Admitting: Physician Assistant

## 2021-07-08 ENCOUNTER — Ambulatory Visit: Payer: Medicare Other | Admitting: Internal Medicine

## 2021-07-08 ENCOUNTER — Ambulatory Visit (INDEPENDENT_AMBULATORY_CARE_PROVIDER_SITE_OTHER): Payer: Medicare Other | Admitting: Internal Medicine

## 2021-07-08 VITALS — BP 122/80 | HR 52 | Temp 98.3°F | Wt 154.0 lb

## 2021-07-08 VITALS — BP 120/84 | HR 68 | Ht 62.0 in | Wt 154.0 lb

## 2021-07-08 DIAGNOSIS — M542 Cervicalgia: Secondary | ICD-10-CM | POA: Diagnosis not present

## 2021-07-08 DIAGNOSIS — R1084 Generalized abdominal pain: Secondary | ICD-10-CM

## 2021-07-08 DIAGNOSIS — R112 Nausea with vomiting, unspecified: Secondary | ICD-10-CM

## 2021-07-08 DIAGNOSIS — K143 Hypertrophy of tongue papillae: Secondary | ICD-10-CM | POA: Diagnosis not present

## 2021-07-08 DIAGNOSIS — M62838 Other muscle spasm: Secondary | ICD-10-CM

## 2021-07-08 DIAGNOSIS — I2510 Atherosclerotic heart disease of native coronary artery without angina pectoris: Secondary | ICD-10-CM

## 2021-07-08 DIAGNOSIS — M4312 Spondylolisthesis, cervical region: Secondary | ICD-10-CM | POA: Diagnosis not present

## 2021-07-08 LAB — COMPREHENSIVE METABOLIC PANEL
ALT: 15 U/L (ref 0–35)
AST: 16 U/L (ref 0–37)
Albumin: 4.2 g/dL (ref 3.5–5.2)
Alkaline Phosphatase: 89 U/L (ref 39–117)
BUN: 15 mg/dL (ref 6–23)
CO2: 26 mEq/L (ref 19–32)
Calcium: 10 mg/dL (ref 8.4–10.5)
Chloride: 107 mEq/L (ref 96–112)
Creatinine, Ser: 0.89 mg/dL (ref 0.40–1.20)
GFR: 62.54 mL/min (ref >60.00)
Glucose, Bld: 85 mg/dL (ref 70–99)
Potassium: 3.9 mEq/L (ref 3.5–5.1)
Sodium: 142 mEq/L (ref 135–145)
Total Bilirubin: 0.4 mg/dL (ref 0.2–1.2)
Total Protein: 7.6 g/dL (ref 6.0–8.3)

## 2021-07-08 LAB — CBC WITH DIFFERENTIAL/PLATELET
Basophils Absolute: 0.1 10*3/uL (ref 0.0–0.1)
Basophils Relative: 0.8 % (ref 0.0–3.0)
Eosinophils Absolute: 0.2 10*3/uL (ref 0.0–0.7)
Eosinophils Relative: 2.8 % (ref 0.0–5.0)
HCT: 35.2 % — ABNORMAL LOW (ref 36.0–46.0)
Hemoglobin: 11.5 g/dL — ABNORMAL LOW (ref 12.0–15.0)
Lymphocytes Relative: 29.9 % (ref 12.0–46.0)
Lymphs Abs: 2.2 10*3/uL (ref 0.7–4.0)
MCHC: 32.7 g/dL (ref 30.0–36.0)
MCV: 88.6 fl (ref 78.0–100.0)
Monocytes Absolute: 0.6 10*3/uL (ref 0.1–1.0)
Monocytes Relative: 8.5 % (ref 3.0–12.0)
Neutro Abs: 4.3 10*3/uL (ref 1.4–7.7)
Neutrophils Relative %: 58 % (ref 43.0–77.0)
Platelets: 288 10*3/uL (ref 150.0–400.0)
RBC: 3.98 Mil/uL (ref 3.87–5.11)
RDW: 12.9 % (ref 11.5–15.5)
WBC: 7.4 10*3/uL (ref 4.0–10.5)

## 2021-07-08 LAB — SEDIMENTATION RATE: Sed Rate: 36 mm/hr — ABNORMAL HIGH (ref 0–30)

## 2021-07-08 LAB — LIPASE: Lipase: 67 U/L — ABNORMAL HIGH (ref 11.0–59.0)

## 2021-07-08 MED ORDER — NYSTATIN 100000 UNIT/ML MT SUSP
5.0000 mL | Freq: Four times a day (QID) | OROMUCOSAL | 0 refills | Status: DC
  Filled 2021-07-08: qty 60, 3d supply, fill #0

## 2021-07-08 MED ORDER — PANCRELIPASE (LIP-PROT-AMYL) 36000-114000 UNITS PO CPEP
ORAL_CAPSULE | ORAL | 1 refills | Status: DC
Start: 2021-07-08 — End: 2022-09-18

## 2021-07-08 MED ORDER — OMEPRAZOLE 40 MG PO CPDR: 40.0000 mg | DELAYED_RELEASE_CAPSULE | Freq: Every day | ORAL | 4 refills | Status: DC

## 2021-07-08 NOTE — Progress Notes (Signed)
Cardiology Office Note    Date:  07/11/2021   ID:  [name], DOB 09/16/1945, MRN [id number]  PCP:  Binnie Rail, MD  Cardiologist:  Previously Dr. Meda Coffee; pt prefers to follow with myself Electrophysiologist:  None   Chief Complaint: f/u CAD  History of Present Illness:   [name] is a 75 y.o. female with history of moderate CAD by coronary CT, aortic atherosclerosis, mild-moderate AI by echo 06/2020, mildly dilated ascending aorta, HTN, PSVT, ankylosing spondylitis/HLA B27 positive, anxiety, arthritis, Bell palsy, diverticulosis, pancreatic insufficiency, HLD, HTN, interstitial cystitis, OA, pre-DM, OSA on CPAP (followed at [facility]), mild anemia who presents for routine cardiology evaluation. Her husband [name] and daughter [name] are also followed by our clinic.   She previously established with Dr. Meda Coffee for history of chest pain in the context of HLA B27 positivity. Prior nuclear stress test 12/2012 was normal and coronary CT 10/2016 showed moderate CAD with negative FFR. In a previous year she had reported lower extremity edema and facial edema not markedly appreciated at that time on exam. Amlodipine was changed to HCTZ but eventually switched back at a later time by primary care as she had muscle cramping. She saw Dr. Meda Coffee in 07/2020 complaining of palpitations and chest pain. Repeat coronary CTA 07/2020 showed moderate stenosis in the prox and distal RCA, mild stenosis in the prox LAD with negative FFR, no abnormalities mentioned in aorta. Event monitor showed NSR with short runs of SVT, rare PVCs, PACs, average HR 75bpm, range 42-176 (low reading was during sleeping hours). Dr. Meda Coffee suggested consideration of flecainide and patient wished to defer. She has also followed with the lipid clinic in the past for cholesterol management but ultimately had been generally tolerating rosuvastatin 57m. She has a history of mylagias/RLS although it was not clear this was specifically  related to the statin itself. LE arterial testing 2020 was normal. Last echo 06/30/21 showed EF 60-65%, grade 1 Dd, mild-moderate AI, mild dilation of ascending aorta, stable from prior.   She is seen back for follow-up largely stable from a cardiac standpoint. She still has episodic atypical chest pains, focal/pinpoint, or across from her shoulder, without convincing anginal component or acute change from prior baseline. This was described in multiple prior office visits as well without recent progression or concerning associated warning symptoms. She does note continued intermittent muscle cramping. She reports generalized fatigue. She uses CPAP but notes this is not 100% of the time. She also notes episodic LE edema in the context of varicose veins. These symptoms are ones we have discussed in prior visits as well so in general there is no dramatic change in her cardiac status from prior visits. She recently saw GI for abdominal pain and had mildly elevated lipase level. CT abd/pelvis is planned for later this week. It has been a rough year for husband Joe. She remains his primary caregiver. Daughter KBelenda Cruisehas been recovering from surgery this summer.   Labwork independently reviewed: 07/08/2021 labs WBC wnl Hgb 11.5, Plt 288, CMET wnl, lipase 67, sed rate 36 05/2021 LDL 23, trig 121 11/2020 Mg 2.0, K 4.1, Cr 0.99, LFTs ok, A1C 6.3, thyroid normal 09/2020 LDL 59  Past Medical History:  Diagnosis Date   Allergy    seasonal   Ankylosing spondylitis (HCC)    Anxiety    Arthritis    Bell palsy    CAD (coronary artery disease)    a. Coronary CT 10/2016 showed calcium score in the  59th perecntile, moderate CAD in RCA/PDA and narrowing of the mid to distal LAD, with negative FFR.   CTS (carpal tunnel syndrome)    Cystitis    Diabetes mellitus without complication ([place])    Diverticulitis    Diverticulosis of colon    Dry eye syndrome    Exocrine pancreatic insufficiency    Frozen shoulder     GERD (gastroesophageal reflux disease)    HLA B27 (HLA B27 positive)    Hx of colonic polyps    Hyperlipidemia    NMR 2005   Hypertension    Hypothyroidism    Incontinence    Internal hemorrhoids    Interstitial cystitis    Kidney stone    Menopause    Mild aortic insufficiency    Mild ascending aorta dilatation (HCC)    Osteoarthritis    Osteopenia    [facility] [address]   Pre-diabetes    PSVT (paroxysmal supraventricular tachycardia) ([place])    S/P total hysterectomy and bilateral salpingo-oophorectomy 04/12/2014   Sinusitis    Sleep apnea    c-pap   Subjective visual disturbance of both eyes 12/30/2013   Tubular adenoma of colon    Vitamin D deficiency    Xerostomia    and Xeroophthalmia    Past Surgical History:  Procedure Laterality Date   BLADDER SURGERY     for incontinence   CARPAL TUNNEL RELEASE Left 12/22/2014   Procedure: LEFT CARPAL TUNNEL RELEASE;  Surgeon: Daryll Brod, MD;  Location: [facility];  Service: Orthopedics;  Laterality: Left;   CARPAL TUNNEL RELEASE Right 02/18/2019   Procedure: RIGHT CARPAL TUNNEL RELEASE;  Surgeon: Daryll Brod, MD;  Location: [place];  Service: Orthopedics;  Laterality: Right;   COLONOSCOPY W/ POLYPECTOMY     adenomatous poyp 04-2008   colonscopy     tics 10-2002   CYSTOSCOPY WITH RETROGRADE PYELOGRAM, URETEROSCOPY AND STENT PLACEMENT Left 09/01/2013   Procedure: CYSTOSCOPY WITH RETROGRADE PYELOGRAM, AND LEFT STENT PLACEMENT;  Surgeon: Molli Hazard, MD;  Location: [facility];  Service: Urology;  Laterality: Left;   elevated LFTs     due to [place]   FINGER SURGERY Left 2004   2nd finger   G 1 P 1     interstitial cystitis     with clot in catheter   PARTIAL HYSTERECTOMY     with one ovary left   SHOULDER ARTHROSCOPY Left    TOTAL ABDOMINAL HYSTERECTOMY W/ BILATERAL SALPINGOOPHORECTOMY     dysfunctional menses    Current Medications: Current Meds  Medication Sig    acetaminophen (TYLENOL) 500 MG tablet Take 1,000 mg by mouth 3 (three) times daily as needed.    amLODipine (NORVASC) 2.5 MG tablet Take 1 tablet (2.5 mg total) by mouth daily.   Ascorbic Acid (VITAMIN C) 1000 MG tablet Take 1,000 mg by mouth 2 (two) times daily.    aspirin 81 MG tablet Take 81 mg by mouth daily.   cetirizine (ZYRTEC) 10 MG tablet Take 10 mg by mouth daily.   Cholecalciferol (VITAMIN D-3) 25 MCG (1000 UT) CAPS Take 10,000 Units by mouth daily.    diclofenac Sodium (VOLTAREN) 1 % GEL Apply topically 4 (four) times daily as needed.    dicyclomine (BENTYL) 10 MG capsule Take 1 capsule (10 mg total) by mouth 3 (three) times daily as needed for spasms.   EPINEPHrine (EPIPEN 2-PAK) 0.3 mg/0.3 mL IJ SOAJ injection Use as directed for severe allergic reactions  estradiol (ESTRACE) 0.1 MG/GM vaginal cream estradiol 0.01% (0.1 mg/gram) vaginal cream   HYDROcodone-acetaminophen (NORCO/VICODIN) 5-325 MG tablet hydrocodone 5 mg-acetaminophen 325 mg tablet  TAKE 1 TABLET BY MOUTH THREE TIMES DAILY AS NEEDED FOR 7 DAYS   lipase/protease/amylase (CREON) 36000 UNITS CPEP capsule Take 2 capsules by mouth with each meal, 1 capsule by mouth with each snack   magnesium oxide (MAG-OX) 400 MG tablet Take 200 mg by mouth daily.   Multiple Vitamins-Minerals (CENTRUM SILVER 50+WOMEN PO) Take 1 tablet by mouth daily.   naloxone (NARCAN) 4 MG/0.1ML LIQD nasal spray kit Narcan 4 mg/actuation nasal spray  PLACE 1 SPRAY INTO EACH NOSTRIL FOR SUSPECTED OPIOID OVERDOSE. MAY REPEAT 2 - 3 MINUTES WITH NEW BOTTLE IN OTHER NOSTRIL   nystatin (MYCOSTATIN) 100000 UNIT/ML suspension Take 5 mLs (500,000 Units total) by mouth 4 (four) times daily.   olopatadine (PATANOL) 0.1 % ophthalmic solution Place 1 drop into both eyes 2 (two) times daily.   Omega-3 Fatty Acids (FISH OIL BURP-LESS) 1000 MG CAPS Takes 1000 mg BID   omeprazole (PRILOSEC) 40 MG capsule Take 1 capsule (40 mg total) by mouth daily.   ondansetron  (ZOFRAN) 4 MG tablet Take 1 tablet (4 mg total) by mouth every 8 (eight) hours as needed.   rosuvastatin (CRESTOR) 20 MG tablet Take 1 tablet (20 mg total) by mouth daily.   tiZANidine (ZANAFLEX) 4 MG tablet Take 1 tablet (4 mg total) by mouth every 6 (six) hours as needed for muscle spasms.   vitamin B-12 (CYANOCOBALAMIN) 1000 MCG tablet Take 1,000 mcg by mouth 2 (two) times daily.    vitamin E 400 UNIT capsule Take 400 Units by mouth daily.   zinc gluconate 50 MG tablet Take 50 mg by mouth daily.     Allergies:   Bactrim [sulfamethoxazole-trimethoprim], Nitroglycerin, Pentosan polysulfate, Pentosan polysulfate sodium, Promethazine hcl, Sulfamethoxazole-trimethoprim, Losartan, Promethazine hcl, and Lisinopril   Social History   Socioeconomic History   Marital status: Married    Spouse name: Not on file   Number of children: 1   Years of education: BS   Highest education level: Not on file  Occupational History   Occupation: retired   Occupation: Pharmacist, hospital    Comment: Kindergarten  Tobacco Use   Smoking status: Never   Smokeless tobacco: Never  Vaping Use   Vaping Use: Never used  Substance and Sexual Activity   Alcohol use: Yes    Comment: social   Drug use: No   Sexual activity: Not Currently  Other Topics Concern   Not on file  Social History Narrative   Lives at home with husband.   No daily use of caffeine.   Right-handed.         Social Determinants of Health   Financial Resource Strain: Low Risk    Difficulty of Paying Living Expenses: Not hard at all  Food Insecurity: No Food Insecurity   Worried About Charity fundraiser in the Last Year: Never true   [place] in the Last Year: Never true  Transportation Needs: No Transportation Needs   Lack of Transportation (Medical): No   Lack of Transportation (Non-Medical): No  Physical Activity: Sufficiently Active   Days of Exercise per Week: 5 days   Minutes of Exercise per Session: 30 min  Stress: No Stress  Concern Present   Feeling of Stress : Not at all  Social Connections: Socially Integrated   Frequency of Communication with Friends and Family: More than three times  a week   Frequency of Social Gatherings with Friends and Family: Once a week   Attends Religious Services: More than 4 times per year   Active Member of [employer] or Organizations: Yes   Attends Music therapist: More than 4 times per year   Marital Status: Married     Family History:  The patient's family history includes Allergic rhinitis in her sister and sister; Asthma in her mother; Breast cancer in her maternal aunt; Cirrhosis in her father; Colon cancer (age of onset: 37) in her sister; Coronary artery disease in her maternal grandfather and mother; Dementia in her mother; Diabetes in her father, paternal aunt, and paternal grandmother; Food Allergy in her sister; Hypertension in her mother; Renal cancer in her paternal aunt; Stroke in her mother. There is no history of Esophageal cancer or Stomach cancer.  ROS:   Please see the history of present illness.  All other systems are reviewed and otherwise negative.    EKGs/Labs/Other Studies Reviewed:    Studies reviewed are outlined and summarized above. Reports included below if pertinent.  Cor CTA 1/202 FINDINGS: A 100 kV prospective scan was triggered in the descending thoracic aorta at 111 HU's. Axial non-contrast 3 mm slices were carried out through the heart. The data set was analyzed on a dedicated work station and scored using the [place]. Gantry rotation speed was 250 msecs and collimation was .6 mm. 50 mg of PO Metoprolol and 0.8 mg of sl NTG was given. The 3D data set was reconstructed in 5% intervals of the 67-82 % of the R-R cycle. Diastolic phases were analyzed on a dedicated work station using MPR, MIP and VRT modes. The patient received 80 cc of contrast.   Aorta: Normal size. Minimal atherosclerotic plaque and calcifications. No  dissection.   Aortic Valve:  Trileaflet.  Trivial calcifications.   Coronary Arteries:  Normal coronary origin.  Right dominance.   RCA is a large dominant artery that gives rise to PDA and PLA. There is moderate diffuse calcified plaque in the proximal portion with stenosis 50-69%. Distal RCA has moderate mixed plaque with stenosis 50-69%. PLA and PDA are small with minimal plaque.   Left main is a large artery that gives rise to LAD and LCX arteries. Left main is a large vessel with only luminal irregularities.   LAD is a large vessel that gives rise to one diagonal artery. There is mild predominantly calcified plaque in the proximal portion with stenosis 25-49%. Mid and distal LAD have only luminal irregularities.   D1 is small and has mild calcified focal stenosis in the proximal portion with stenosis 25-49%.   LCX is a non-dominant artery that gives rise to one large OM1 branch. There are only minimal irregularities.   Other findings:   Normal pulmonary vein drainage into the left atrium.   Normal left atrial appendage without a thrombus.   Normal size of the pulmonary artery.   IMPRESSION: 1. Coronary calcium score of 199. This was 25 percentile for age and sex matched control. In 2018 it was calcium score of 56 (16 percentile for age and sex matched control).   2. Normal coronary origin with right dominance.   3. CAD-RADS 3. Moderate stenosis in the proximal and distal RCA, mild stenosis in the proximal LAD. Consider symptom-guided anti-ischemic pharmacotherapy as well as risk factor modification per guideline directed care. Additional analysis with CT FFR will be submitted. There is progression of disease since the prior CCTA in  2018.   FINDINGS: FFRct analysis was performed on the original cardiac CT angiogram dataset. Diagrammatic representation of the FFRct analysis is provided in a separate PDF document in PACS. This dictation was created using the PDF  document and an interactive 3D model of the results. 3D model is not available in the EMR/PACS. Normal FFR range is >0.80.   1. Left Main: 0.99.   2. LAD: Proximal: 0.98, mid: 0.94, distal: 0.90. 3. D1: 0.96. 4. LCX: 0.99. 5. OM1: 0.98 6. RCA: Proximal: 0.99, mid: 0.94, distal: 0.88.   IMPRESSION: 1. CT FFR analysis didn't show any significant stenosis. Intensify medical management to slow down disease progression.     Electronically Signed   By: Ena Dawley   On: 08/26/2020 22:37   Monitor 07/2020 Patient had a min HR of 42 bpm, max HR of 176 bpm, and avg HR of 75 bpm 11 very short Supraventricular Tachycardia runs occurred, the run with the fastest interval lasting 4 beats with a max rate of 176 bpm, the longest lasting 10 beats with an avg rate of 114 bpm. Rare PACs and PVCs.   Predominant underlying rhythm was Sinus Rhythm. 11 very short Supraventricular Tachycardia runs occurred. the run with the fastest interval lasting 4 beats with a max rate of 176 bpm, the  longest lasting 10 beats with an avg rate of 114 bpm.    2D echo 06/2020    1. Left ventricular ejection fraction, by estimation, is 60 to 65%. The  left ventricle has normal function. The left ventricle has no regional  wall motion abnormalities. There is mild concentric left ventricular  hypertrophy. Left ventricular diastolic  parameters are consistent with Grade I diastolic dysfunction (impaired  relaxation).   2. Right ventricular systolic function is normal. The right ventricular  size is normal.   3. The mitral valve is normal in structure. No evidence of mitral valve  regurgitation. No evidence of mitral stenosis.   4. The aortic valve is tricuspid. Aortic valve regurgitation is mild to  moderate. No aortic stenosis is present.   5. Aortic dilatation noted. There is mild dilatation of the ascending  aorta, measuring 38 mm.   6. The inferior vena cava is normal in size with greater than 50%   respiratory variability, suggesting right atrial pressure of 3 mmHg.   Comparison(s): No significant change from prior study. Prior images  reviewed side by side  2D Echo 06/30/21  1. Left ventricular ejection fraction, by estimation, is 60 to 65%. The  left ventricle has normal function. The left ventricle has no regional  wall motion abnormalities. There is mild concentric left ventricular  hypertrophy. Left ventricular diastolic  parameters are consistent with Grade I diastolic dysfunction (impaired  relaxation).   2. Right ventricular systolic function is normal. The right ventricular  size is normal.   3. The mitral valve is normal in structure. Trivial mitral valve  regurgitation.   4. The aortic valve is tricuspid. There is mild calcification of the  aortic valve. There is mild thickening of the aortic valve. Aortic valve  regurgitation is mild to moderate. Aortic valve sclerosis is present, with  no evidence of aortic valve  stenosis.   5. There is mild dilatation of the ascending aorta, measuring 37 mm.   6. The inferior vena cava is normal in size with greater than 50%  respiratory variability, suggesting right atrial pressure of 3 mmHg.   Comparison(s): Compared to prior TTE in 06/2020, there is no  significant  change. The AR remains mild-to-moderate and the ascending aorta size  remains stable in size (previously reported at 37m).       EKG:  EKG is ordered today, personally reviewed, demonstrating NSR with sinus arrhythmia 64bpm, LAFB, poor R wave progression in anterior leads. No acute changes from prir.  Recent Labs: 11/29/2020: TSH 2.16 12/06/2020: Magnesium 2.0 07/08/2021: ALT 15; BUN 15; Creatinine, Ser 0.89; Hemoglobin 11.5; Platelets 288.0; Potassium 3.9; Sodium 142  Recent Lipid Panel    Component Value Date/Time   CHOL 101 06/03/2021 1212   CHOL 115 03/07/2021 0909   TRIG 121.0 06/03/2021 1212   TRIG 181 (H) 07/23/2006 1101   HDL 53.80 06/03/2021 1212    HDL 61 03/07/2021 0909   CHOLHDL 2 06/03/2021 1212   VLDL 24.2 06/03/2021 1212   LDLCALC 23 06/03/2021 1212   LDLCALC 33 03/07/2021 0909   LDLDIRECT 114.0 05/02/2016 0919    PHYSICAL EXAM:    VS:  BP 120/82   Pulse 67   Ht 5' 2" (1.575 m)   Wt 154 lb (69.9 kg)   SpO2 98%   BMI 28.17 kg/m   BMI: Body mass index is 28.17 kg/m.  GEN: Well nourished, well developed female in no acute distress HEENT: normocephalic, atraumatic Neck: no JVD, carotid bruits, or masses Cardiac: RRR; no murmurs, rubs, or gallops, trace LE edema, nonpitting, with varicose veins Respiratory:  clear to auscultation bilaterally, normal work of breathing GI: soft, nontender, nondistended, + BS MS: no deformity or atrophy Skin: warm and dry, no rash Neuro:  Alert and Oriented x 3, Strength and sensation are intact, follows commands Psych: euthymic mood, full affect  Wt Readings from Last 3 Encounters:  07/11/21 154 lb (69.9 kg)  07/08/21 154 lb (69.9 kg)  07/08/21 154 lb (69.9 kg)     ASSESSMENT & PLAN:   1. Moderate CAD with HLD goal LDL <70, muscle cramping - has history of atypical chest pains, generally unchanged from prior, last assessed for these in 07/2020 with CT findings above. Would continue risk factor modification as we are doing with ASA/statin. However, she has continued to have issues with muscle cramping. It has not been totally clear in the past if this was completely related to statin therapy. Last LDL was in the 20s so we will trial scaling back the rosuvastatin to 1669mdaily to see if this helps. Will add on Mg level to her lytes when she returns for labs in 1 week (see #3). We discussed clinical symptoms of warning signs to notify for.  2. Brief PSVT by prior monitor - symptoms generally quiescent. Continue to follow for any accelerating symptoms.  3. Essential HTN with trace lower extremity edema - this has been an issue in the past prompting switch from amlodipine to HCTZ. However, she  was later switched back in the past due to muscle cramping. We will stop amlodipine and trial spironolactone 12.69m87maily with recheck BMET/Mg in 1 week. She does follow her BP at home. Recent echo continued to show normal LV function.  4. Mild-moderate AI with mild dilation of ascending aorta - echo results reviewed today with patient, questions answered. Anticipate repeat echo 06/2022. We can arrange closer to that time.    Disposition: F/u with myself per pt request in 3-4 months.  Medication Adjustments/Labs and Tests Ordered: Current medicines are reviewed at length with the patient today.  Concerns regarding medicines are outlined above. Medication changes, Labs and Tests ordered today are summarized  above and listed in the Patient Instructions accessible in Encounters.   Signed, Charlie Pitter, PA-C  07/11/2021 10:54 AM    [facility] Phone: [phone]; Fax: [phone]

## 2021-07-08 NOTE — Patient Instructions (Signed)
If you are age 77 or older, your body mass index should be between 23-30. Your Body mass index is 28.17 kg/m. If this is out of the aforementioned range listed, please consider follow up with your Primary Care Provider. ________________________________________________________  The [facility] providers would like to encourage you to use [facility] to communicate with providers for non-urgent requests or questions.  Due to long hold times on the telephone, sending your provider a message by [address] may be a faster and more efficient way to get a response.  Please allow 48 business hours for a response.  Please remember that this is for non-urgent requests.  _______________________________________________________  Your provider has requested that you go to the basement level for lab work before leaving today. Press "B" on the elevator. The lab is located at the first door on the left as you exit the elevator.  You have been scheduled for a CT scan of the abdomen and pelvis at [facility], 1st floor Radiology. You are scheduled on 07/13/2021  at 9:30 am. You should arrive 15 minutes prior to your appointment time for registration.  Please pick up 2 bottles of contrast from [place] at least 3 days prior to your scan. The solution may taste better if refrigerated, but do NOT add ice or any other liquid to this solution. Shake well before drinking.   Please follow the written instructions below on the day of your exam:   1) Do not eat anything after 5:30 am (4 hours prior to your test)   2) Drink 1 bottle of contrast @ 7:30 am (2 hours prior to your exam)  Remember to shake well before drinking and do NOT pour over ice.     Drink 1 bottle of contrast @ 8:30 am (1 hour prior to your exam)   You may take any medications as prescribed with a small amount of water, if necessary. If you take any of the following medications: METFORMIN, GLUCOPHAGE, GLUCOVANCE, AVANDAMET, RIOMET, FORTAMET, [place]  MET, JANUMET, GLUMETZA or METAGLIP, you MAY be asked to HOLD this medication 48 hours AFTER the exam.   The purpose of you drinking the oral contrast is to aid in the visualization of your intestinal tract. The contrast solution may cause some diarrhea. Depending on your individual set of symptoms, you may also receive an intravenous injection of x-ray contrast/dye. Plan on being at [facility] for 45 minutes or longer, depending on the type of exam you are having performed.   If you have any questions regarding your exam or if you need to reschedule, you may call [facility] at [phone] between the hours of 8:00 am and 5:00 pm, Monday-Friday.    Refills of Omeprazole and Creon have been sent to your pharmacy.  Continue Ondansetron every 6 hours as needed and dicyclomine twice daily as needed.  Follow a bland diet.  Follow up pending the results of your CT.  Thank you for entrusting me with your care and choosing [facility].  Amy Esterwood, PA-C

## 2021-07-08 NOTE — Assessment & Plan Note (Signed)
Acute She does have some spotty coating on her tongue that has improved per her since brushing her tongue more vigorously No recent antibiotics or steroids Doubt thrush especially given improvement Possible bacterial/debris buildup Continue vigorous oral hygiene She will let me know if there is no improvement and at that time I would consider an antifungal, but at this time I do not think is necessary

## 2021-07-08 NOTE — Progress Notes (Addendum)
Subjective:    Patient ID: [name], female    DOB: 1946-05-27, 75 y.o.   MRN: [id number]  HPI [name] is a pleasant 75 year old white female, established with Dr. Hilarie Fredrickson with history of IBS, and pancreatic exocrine insufficiency.  Also with history of adenomatous colon polyps and family history of colon cancer, chronic GERD. She comes in today with complaints of acute abdominal pain nausea and vomiting onset on 06/03/2021.  She says she developed severe pain in the periumbilical area which was crampy in nature followed by nausea and vomiting.  No associated diarrhea, no fever or chills.  That episode resolved by the following day but she has continued to have ongoing soreness and discomfort in the mid abdomen worse postprandially.  She had a second episode on 06/16/2021 again with severe cramping, nausea and vomiting.  She had some diaphoresis since this episode but no fever or chills and again no diarrhea.  She has not had any further episodes of vomiting but continues to have intermittent nausea and queasiness.  Over the past few days she says her abdominal discomfort has been less "severe".  She has not been on any new medications, no NSAID use.  She relates that she has not been using the Creon on a regular basis but takes it periodically.  She does remain on Prilosec 40 mg daily. Patient is status post hysterectomy/rectocele cystocele repair.  Other medical problems include hypertension, sleep apnea, adult onset diabetes mellitus, lumbar radicular disease/ankylosing spondylitis. She had remote EGD here in 2009 showing mild reflux esophagitis, and is up-to-date with colonoscopy, due for follow-up July 2024  Review of Systems Pertinent positive and negative review of systems were noted in the above HPI section.  All other review of systems was otherwise negative.   Outpatient Encounter Medications as of 07/08/2021  Medication Sig   acetaminophen (TYLENOL) 500 MG tablet Take 1,000 mg by mouth 3  (three) times daily as needed.    amLODipine (NORVASC) 2.5 MG tablet Take 1 tablet (2.5 mg total) by mouth daily.   Ascorbic Acid (VITAMIN C) 1000 MG tablet Take 1,000 mg by mouth 2 (two) times daily.    aspirin 81 MG tablet Take 81 mg by mouth daily.   cetirizine (ZYRTEC) 10 MG tablet Take 10 mg by mouth daily.   Cholecalciferol (VITAMIN D-3) 25 MCG (1000 UT) CAPS Take 10,000 Units by mouth daily.    diclofenac Sodium (VOLTAREN) 1 % GEL Apply topically 4 (four) times daily as needed.    dicyclomine (BENTYL) 10 MG capsule Take 1 capsule (10 mg total) by mouth 3 (three) times daily as needed for spasms.   EPINEPHrine (EPIPEN 2-PAK) 0.3 mg/0.3 mL IJ SOAJ injection Use as directed for severe allergic reactions   estradiol (ESTRACE) 0.1 MG/GM vaginal cream estradiol 0.01% (0.1 mg/gram) vaginal cream   HYDROcodone-acetaminophen (NORCO/VICODIN) 5-325 MG tablet hydrocodone 5 mg-acetaminophen 325 mg tablet  TAKE 1 TABLET BY MOUTH THREE TIMES DAILY AS NEEDED FOR 7 DAYS   magnesium oxide (MAG-OX) 400 MG tablet Take 200 mg by mouth daily.   Multiple Vitamins-Minerals (CENTRUM SILVER 50+WOMEN PO) Take 1 tablet by mouth daily.   naloxone (NARCAN) 4 MG/0.1ML LIQD nasal spray kit Narcan 4 mg/actuation nasal spray  PLACE 1 SPRAY INTO EACH NOSTRIL FOR SUSPECTED OPIOID OVERDOSE. MAY REPEAT 2 - 3 MINUTES WITH NEW BOTTLE IN OTHER NOSTRIL   nystatin (MYCOSTATIN) 100000 UNIT/ML suspension Take 5 mLs (500,000 Units total) by mouth 4 (four) times daily.   olopatadine (PATANOL)  0.1 % ophthalmic solution Place 1 drop into both eyes 2 (two) times daily.   Omega-3 Fatty Acids (FISH OIL BURP-LESS) 1000 MG CAPS Takes 1000 mg BID   ondansetron (ZOFRAN) 4 MG tablet Take 1 tablet (4 mg total) by mouth every 8 (eight) hours as needed.   tiZANidine (ZANAFLEX) 4 MG tablet Take 1 tablet (4 mg total) by mouth every 6 (six) hours as needed for muscle spasms.   vitamin B-12 (CYANOCOBALAMIN) 1000 MCG tablet Take 1,000 mcg by mouth 2  (two) times daily.    vitamin E 400 UNIT capsule Take 400 Units by mouth daily.   zinc gluconate 50 MG tablet Take 50 mg by mouth daily.   [DISCONTINUED] lipase/protease/amylase (CREON) 36000 UNITS CPEP capsule Take 2 capsules by mouth with each meal, 1 capsule by mouth with each snack   [DISCONTINUED] omeprazole (PRILOSEC) 40 MG capsule TAKE 1 CAPSULE BY MOUTH ONCE DAILY   lipase/protease/amylase (CREON) 36000 UNITS CPEP capsule Take 2 capsules by mouth with each meal, 1 capsule by mouth with each snack   omeprazole (PRILOSEC) 40 MG capsule Take 1 capsule (40 mg total) by mouth daily.   rosuvastatin (CRESTOR) 20 MG tablet Take 1 tablet (20 mg total) by mouth daily.   [DISCONTINUED] azelastine (ASTELIN) 0.1 % nasal spray One to two sprays each nostril twice a day as needed. (Patient not taking: Reported on 07/08/2021)   [DISCONTINUED] Difluprednate 0.05 % EMUL Place 1 drop into the left eye 4 (four) times daily. Start 2 days prior to surgery and use as directed after surgery. (Patient not taking: Reported on 07/08/2021)   [DISCONTINUED] moxifloxacin (VIGAMOX) 0.5 % ophthalmic solution Place 1 drop into the left eye four times daily starting 2 days prior to surgery and place 2 drops the morning of surgery. (Patient not taking: Reported on 07/08/2021)   No facility-administered encounter medications on file as of 07/08/2021.   Allergies  Allergen Reactions   Bactrim [Sulfamethoxazole-Trimethoprim] Other (See Comments)   Nitroglycerin Anaphylaxis    Hypotensive after NTG administration in ER during chest pain evaluation Pt states she almost died,  Pt states in a procedure done in 10/2016 she had no reaction to this medication, so not sure if it is an actual allergy.    Pentosan Polysulfate Other (See Comments)    Elevated liver count Elevated liver enzymes Elevated LFTs.......Marland Kitchen elmiron    Pentosan Polysulfate Sodium     Elevated LFTs.......Marland Kitchen elmiron    Promethazine Hcl Other (See Comments)     "jittery on the inside"   Sulfamethoxazole-Trimethoprim Nausea Only    Unknown reaction per pt, Pt thinks she may have felt sick to her stomach.    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/13/2018   Atypical chest pain 05/21/2018   Epigastric pain 05/21/2018   Baker's cyst of knee, right 05/07/2018   Perennial and seasonal allergic rhinitis 04/18/2018   Onychomycosis of toenail 03/04/2018   Hypercalcemia 09/17/2017  Left wrist pain 09/17/2017   Synovitis of left knee 05/22/2017   Diabetes ([place]) 05/02/2017   Trigger point of left shoulder region 03/27/2017   Taste disorder 02/22/2017   Anxiety and depression 02/12/2017   Caregiver with fatigue 01/17/2017   Psychosocial stressors 01/17/2017   Decreased sense of taste 01/15/2017   Family history of osteoporosis 11/16/2016   Atherosclerotic plaque 11/07/2016   Chronic or recurrent subluxation of peroneal tendon of left foot 09/13/2016   Osteoporosis 06/21/2016   Dizziness 05/16/2016   Arthritis of left shoulder region 01/17/2016   Ankylosing spondylitis ([place]) 11/08/2015   Incontinence 10/28/2015   Thoracic back pain 10/19/2015   Leg cramping 09/29/2015   Primary osteoarthritis of left shoulder 08/03/2015   Impingement syndrome of left shoulder region 09/22/2014   Interstitial cystitis 07/09/2014   Diverticulosis of colon without hemorrhage 07/08/2014   Carpal tunnel  syndrome of left wrist 04/12/2014   Food allergy 03/06/2014   Renal calculi 01/30/2014   OSA (obstructive sleep apnea), moderate 01/14/2014   Allergic conjunctivitis 12/06/2013   History of Bell's palsy 02/26/2012   Vitamin D deficiency 06/27/2010   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   DRY MOUTH 06/27/2010   Gastroesophageal reflux disease 04/30/2009   History of colonic polyps 04/30/2009   Hyperlipidemia 08/21/2007   Essential hypertension 08/21/2007   Polyarthralgia 08/21/2007   Chronic lumbar radiculopathy 01/25/2007   Social History   Socioeconomic History   Marital status: Married    Spouse name: Not on file   Number of children: 1   Years of education: BS   Highest education level: Not on file  Occupational History   Occupation: retired   Occupation: Pharmacist, hospital    Comment: Kindergarten  Tobacco Use   Smoking status: Never   Smokeless tobacco: Never  Vaping Use   Vaping Use: Never used  Substance and Sexual Activity   Alcohol use: Yes    Comment: social   Drug use: No   Sexual activity: Not Currently  Other Topics Concern   Not on file  Social History Narrative   Lives at home with husband.   No daily use of caffeine.   Right-handed.         Social Determinants of Health   Financial Resource Strain: Low Risk    Difficulty of Paying Living Expenses: Not hard at all  Food Insecurity: No Food Insecurity   Worried About Charity fundraiser in the Last Year: Never true   [place] in the Last Year: Never true  Transportation Needs: No Transportation Needs   Lack of Transportation (Medical): No   Lack of Transportation (Non-Medical): No  Physical Activity: Sufficiently Active   Days of Exercise per Week: 5 days   Minutes of Exercise per Session: 30 min  Stress: No Stress Concern Present   Feeling of Stress : Not at all  Social Connections: Socially Integrated   Frequency of Communication with Friends and Family: More than three times a week   Frequency of  Social Gatherings with Friends and Family: Once a week   Attends Religious Services: More than 4 times per year   Active Member of [employer] or Organizations: Yes   Attends Music therapist: More than 4 times per year   Marital Status: Married  Human resources officer Violence: Not At Risk   Fear of Current or Ex-Partner: No   Emotionally Abused: No   Physically Abused: No   Sexually Abused: No    [name]'s family history  includes Allergic rhinitis in her sister and sister; Asthma in her mother; Breast cancer in her maternal aunt; Cirrhosis in her father; Colon cancer (age of onset: 24) in her sister; Coronary artery disease in her maternal grandfather and mother; Dementia in her mother; Diabetes in her father, paternal aunt, and paternal grandmother; Food Allergy in her sister; Hypertension in her mother; Renal cancer in her paternal aunt; Stroke in her mother.      Objective:    Vitals:   07/08/21 1350  BP: 120/84  Pulse: 68  SpO2: 96%    Physical Exam Well-developed well-nourished older WF  in no acute distress.  Height, UMPNTI,144 BMI 28.1  HEENT; nontraumatic normocephalic, EOMI, PE R LA, sclera anicteric. Oropharynx; yellowish-green coating on tongue Neck; supple, no JVD Cardiovascular; regular rate and rhythm with S1-S2, no murmur rub or gallop Pulmonary; Clear bilaterally Abdomen; soft, there is some rather generalized abdominal tenderness, no guarding or rebound ,nondistended, no palpable mass or hepatosplenomegaly, bowel sounds are active Rectal; not done today Skin; benign exam, no jaundice rash or appreciable lesions Extremities; no clubbing cyanosis or edema skin warm and dry Neuro/Psych; alert and oriented x4, grossly nonfocal mood and affect appropriate        Assessment & Plan:   #80 75 year old white female with 5-week history of mid/periumbilical abdominal pain, with 2 distinct episodes of severe pain associated with nausea and vomiting.  Symptoms not  as intense over the past week but continues with ongoing abdominal discomfort in the mid abdomen and on exam rather generalized with frequent nausea and increased abdominal pain postprandially. Etiology of symptoms is not clear, rule out intermittent partial obstruction, rule out gallbladder disease, rule out other intra-abdominal inflammatory process.  #2 status post remote hysterectomy/rectocele cystocele repair-transverse low incisional scar #3 GERD stable #4.  History of adenomatous colon polyps-last colonoscopy June 2019 and due for 5-year interval follow-up #5 family history of colon cancer #6 adult onset diabetes mellitus #7.  Ankylosing spondylitis #8.  Sleep apnea #9.  Hypertension  #10.  History of IBS #11.  History of exocrine pancreatic insufficiency-not having daily symptoms, using Creon periodically  #12 oral thrush  Plan; CBC with differential, c-Met, lipase, sed rate Schedule for CT of the abdomen pelvis with contrast. Criss Rosales diet Zofran 4 mg every 6 hours as needed for nausea Continue Prilosec 40 mg p.o. every morning-refill sent x1 year Patient has prescription for dicyclomine 10 mg every 6 hours as needed, advised she try this if she has any further episodes of significant pain Will refill Creon 36,000, 2 p.o. AC meals and 1 AC snacks Start nystatin oral suspension 5 cc swish and swallow 4 times daily x2 weeks Further recommendations pending results of labs and CT.    Roxy Filler S Makoto Sellitto PA-C 07/08/2021   Cc: Binnie Rail, MD

## 2021-07-08 NOTE — Assessment & Plan Note (Signed)
Acute Has seen Dr. Georgina Snell for this and was referred for PT, which she will start next week He also started her on tizanidine, but she did not try this Continue heat Advised gentle massage, gentle stretching Can apply topical muscle medications Encouraged her to try the tizanidine-consider half of the pill initially at night.  Discussed this could cause some dizziness or drowsiness, but most likely will help the muscles relax further Will get cervical spine x-ray-discussed with her that I think she has arthritis and most likely this just happened from sleeping wrong or something else

## 2021-07-08 NOTE — Patient Instructions (Addendum)
    Have an xray downstairs.     Medications changes include :   none

## 2021-07-11 ENCOUNTER — Other Ambulatory Visit (HOSPITAL_BASED_OUTPATIENT_CLINIC_OR_DEPARTMENT_OTHER): Payer: Self-pay

## 2021-07-11 ENCOUNTER — Other Ambulatory Visit: Payer: Self-pay

## 2021-07-11 ENCOUNTER — Ambulatory Visit (INDEPENDENT_AMBULATORY_CARE_PROVIDER_SITE_OTHER): Payer: Medicare Other | Admitting: Physician Assistant

## 2021-07-11 ENCOUNTER — Encounter: Payer: Self-pay | Admitting: Physician Assistant

## 2021-07-11 VITALS — BP 120/82 | HR 67 | Ht 62.0 in | Wt 154.0 lb

## 2021-07-11 DIAGNOSIS — I2510 Atherosclerotic heart disease of native coronary artery without angina pectoris: Secondary | ICD-10-CM

## 2021-07-11 DIAGNOSIS — I77810 Thoracic aortic ectasia: Secondary | ICD-10-CM | POA: Diagnosis not present

## 2021-07-11 DIAGNOSIS — I471 Supraventricular tachycardia: Secondary | ICD-10-CM

## 2021-07-11 DIAGNOSIS — I10 Essential (primary) hypertension: Secondary | ICD-10-CM

## 2021-07-11 DIAGNOSIS — I351 Nonrheumatic aortic (valve) insufficiency: Secondary | ICD-10-CM

## 2021-07-11 DIAGNOSIS — E785 Hyperlipidemia, unspecified: Secondary | ICD-10-CM

## 2021-07-11 MED ORDER — ROSUVASTATIN CALCIUM 10 MG PO TABS: 10.0000 mg | ORAL_TABLET | Freq: Every day | ORAL | 3 refills | Status: DC

## 2021-07-11 MED ORDER — SPIRONOLACTONE 25 MG PO TABS
12.5000 mg | ORAL_TABLET | Freq: Every day | ORAL | 3 refills | Status: DC
  Filled 2021-07-11: qty 45, 90d supply, fill #0

## 2021-07-11 NOTE — Progress Notes (Signed)
Addendum: Reviewed and agree with assessment and management plan. Calieb Lichtman M, MD  

## 2021-07-11 NOTE — Patient Instructions (Addendum)
Medication Instructions:  Your physician has recommended you make the following change in your medication:   REDUCE the Rosuvastatin to 10 mg taking 1 daily  START Spironolactone 25 mg taking 1/2 tablet daily   *If you need a refill on your cardiac medications before your next appointment, please call your pharmacy*   Lab Work: 07/18/21:  COME IN FOR A BMET & MAG.Marland Kitchen anytime after 7:30 a.m  If you have labs (blood work) drawn today and your tests are completely normal, you will receive your results only by: MyChart Message (if you have MyChart) OR A paper copy in the mail If you have any lab test that is abnormal or we need to change your treatment, we will call you to review the results.   Testing/Procedures: None ordered   Follow-Up: At [facility], you and your health needs are our priority.  As part of our continuing mission to provide you with exceptional heart care, we have created designated Provider Care Teams.  These Care Teams include your primary Cardiologist (physician) and Advanced Practice Providers (APPs -  Physician Assistants and Nurse Practitioners) who all work together to provide you with the care you need, when you need it.  We recommend signing up for the patient portal called "MyChart".  Sign up information is provided on this After Visit Summary.  MyChart is used to connect with patients for Virtual Visits (Telemedicine).  Patients are able to view lab/test results, encounter notes, upcoming appointments, etc.  Non-urgent messages can be sent to your provider as well.   To learn more about what you can do with MyChart, go to [email or web address].    Your next appointment:   10/21/21 arrive at 9:00 to see   The format for your next appointment:   In Person  Provider:   Melina Copa, PA-C   Other Instructions  Please monitor your blood pressure occasionally at home. Call your doctor if you tend to get readings of greater than 130 on the top number or 80  on the bottom number.

## 2021-07-12 DIAGNOSIS — M25519 Pain in unspecified shoulder: Secondary | ICD-10-CM | POA: Diagnosis not present

## 2021-07-12 DIAGNOSIS — M25559 Pain in unspecified hip: Secondary | ICD-10-CM | POA: Diagnosis not present

## 2021-07-12 DIAGNOSIS — M79673 Pain in unspecified foot: Secondary | ICD-10-CM | POA: Diagnosis not present

## 2021-07-12 DIAGNOSIS — M48061 Spinal stenosis, lumbar region without neurogenic claudication: Secondary | ICD-10-CM | POA: Diagnosis not present

## 2021-07-12 DIAGNOSIS — M810 Age-related osteoporosis without current pathological fracture: Secondary | ICD-10-CM | POA: Diagnosis not present

## 2021-07-12 DIAGNOSIS — M79643 Pain in unspecified hand: Secondary | ICD-10-CM | POA: Diagnosis not present

## 2021-07-12 DIAGNOSIS — M461 Sacroiliitis, not elsewhere classified: Secondary | ICD-10-CM | POA: Diagnosis not present

## 2021-07-12 DIAGNOSIS — M159 Polyosteoarthritis, unspecified: Secondary | ICD-10-CM | POA: Diagnosis not present

## 2021-07-12 DIAGNOSIS — M179 Osteoarthritis of knee, unspecified: Secondary | ICD-10-CM | POA: Diagnosis not present

## 2021-07-12 DIAGNOSIS — G894 Chronic pain syndrome: Secondary | ICD-10-CM | POA: Diagnosis not present

## 2021-07-13 ENCOUNTER — Ambulatory Visit (INDEPENDENT_AMBULATORY_CARE_PROVIDER_SITE_OTHER): Payer: Medicare Other | Admitting: Physical Therapy

## 2021-07-13 ENCOUNTER — Other Ambulatory Visit: Payer: Self-pay

## 2021-07-13 ENCOUNTER — Encounter (HOSPITAL_BASED_OUTPATIENT_CLINIC_OR_DEPARTMENT_OTHER): Payer: Self-pay

## 2021-07-13 ENCOUNTER — Ambulatory Visit (HOSPITAL_BASED_OUTPATIENT_CLINIC_OR_DEPARTMENT_OTHER)
Admission: RE | Admit: 2021-07-13 | Discharge: 2021-07-13 | Disposition: A | Discharge status: Home / Self Care | Payer: Medicare Other | Source: Ambulatory Visit | Attending: Physician Assistant | Admitting: Physician Assistant

## 2021-07-13 ENCOUNTER — Encounter: Payer: Self-pay | Admitting: Physical Therapy

## 2021-07-13 DIAGNOSIS — R109 Unspecified abdominal pain: Secondary | ICD-10-CM | POA: Diagnosis not present

## 2021-07-13 DIAGNOSIS — R112 Nausea with vomiting, unspecified: Secondary | ICD-10-CM | POA: Diagnosis not present

## 2021-07-13 DIAGNOSIS — M542 Cervicalgia: Secondary | ICD-10-CM

## 2021-07-13 DIAGNOSIS — R1110 Vomiting, unspecified: Secondary | ICD-10-CM | POA: Diagnosis not present

## 2021-07-13 DIAGNOSIS — M62838 Other muscle spasm: Secondary | ICD-10-CM

## 2021-07-13 DIAGNOSIS — K760 Fatty (change of) liver, not elsewhere classified: Secondary | ICD-10-CM | POA: Diagnosis not present

## 2021-07-13 DIAGNOSIS — R293 Abnormal posture: Secondary | ICD-10-CM | POA: Diagnosis not present

## 2021-07-13 MED ORDER — IOHEXOL 300 MG/ML  SOLN
100.0000 mL | Freq: Once | INTRAMUSCULAR | Status: AC | PRN
  Administered 2021-07-13: 10:00:00 100 mL via INTRAVENOUS

## 2021-07-13 NOTE — Patient Instructions (Signed)
Access Code: [email or web address] URL: [email or web address] Date: 07/13/2021 Prepared by: Isabelle Course  Exercises Seated Gentle Upper Trapezius Stretch - 1 x daily - 7 x weekly - 1 sets - 3 reps - 20-30 seconds hold Gentle Levator Scapulae Stretch - 1 x daily - 7 x weekly - 1 sets - 3 reps - 20-30 seconds hold Seated Cervical Rotation AROM - 1 x daily - 7 x weekly - 1 sets - 10 reps - 10 sec hold Seated Cervical Extension AROM - 1 x daily - 7 x weekly - 1 sets - 10 reps - 10 seconds hold  Patient Education Trigger Point Dry Needling

## 2021-07-13 NOTE — Therapy (Addendum)
[place], [place], [address] Phone: [phone]   Fax:  [phone]  Physical Therapy Evaluation and Discharge  Patient Details  Name: [name] MRN: [id number] Date of Birth: September 11, 1945 Referring Provider (PT): Sherene Sires   Encounter Date: 07/13/2021   PT End of Session - 07/13/21 1531     Visit Number 1    Number of Visits 12    Date for PT Re-Evaluation 08/24/21    Authorization Type medicare    Authorization - Visit Number 1    Progress Note Due on Visit 10    PT Start Time 1430    PT Stop Time 1515    PT Time Calculation (min) 45 min    Activity Tolerance Patient tolerated treatment well    Behavior During Therapy [facility] for tasks assessed/performed             Past Medical History:  Diagnosis Date   Allergy    seasonal   Ankylosing spondylitis ([employer])    Anxiety    Arthritis    Bell palsy    CAD (coronary artery disease)    a. Coronary CT 10/2016 showed calcium score in the 59th perecntile, moderate CAD in RCA/PDA and narrowing of the mid to distal LAD, with negative FFR.   CTS (carpal tunnel syndrome)    Cystitis    Diabetes mellitus without complication ([place])    Diverticulitis    Diverticulosis of colon    Dry eye syndrome    Exocrine pancreatic insufficiency    Frozen shoulder    GERD (gastroesophageal reflux disease)    HLA B27 (HLA B27 positive)    Hx of colonic polyps    Hyperlipidemia    NMR 2005   Hypertension    Hypothyroidism    Incontinence    Internal hemorrhoids    Interstitial cystitis    Kidney stone    Menopause    Mild aortic insufficiency    Mild ascending aorta dilatation (HCC)    Osteoarthritis    Osteopenia    [facility] [address]   Pre-diabetes    PSVT (paroxysmal supraventricular tachycardia) ([place])    S/P total hysterectomy and bilateral salpingo-oophorectomy 04/12/2014   Sinusitis    Sleep apnea    c-pap   Subjective  visual disturbance of both eyes 12/30/2013   Tubular adenoma of colon    Vitamin D deficiency    Xerostomia    and Xeroophthalmia    Past Surgical History:  Procedure Laterality Date   BLADDER SURGERY     for incontinence   CARPAL TUNNEL RELEASE Left 12/22/2014   Procedure: LEFT CARPAL TUNNEL RELEASE;  Surgeon: Daryll Brod, MD;  Location: [facility];  Service: Orthopedics;  Laterality: Left;   CARPAL TUNNEL RELEASE Right 02/18/2019   Procedure: RIGHT CARPAL TUNNEL RELEASE;  Surgeon: Daryll Brod, MD;  Location: [facility];  Service: Orthopedics;  Laterality: Right;   COLONOSCOPY W/ POLYPECTOMY     adenomatous poyp 04-2008   colonscopy     tics 10-2002   CYSTOSCOPY WITH RETROGRADE PYELOGRAM, URETEROSCOPY AND STENT PLACEMENT Left 09/01/2013   Procedure: CYSTOSCOPY WITH RETROGRADE PYELOGRAM, AND LEFT STENT PLACEMENT;  Surgeon: Molli Hazard, MD;  Location: [facility];  Service: Urology;  Laterality: Left;   elevated LFTs     due to [place]   FINGER SURGERY Left 2004   2nd finger   G 1 P 1  interstitial cystitis     with clot in catheter   PARTIAL HYSTERECTOMY     with one ovary left   SHOULDER ARTHROSCOPY Left    TOTAL ABDOMINAL HYSTERECTOMY W/ BILATERAL SALPINGOOPHORECTOMY     dysfunctional menses    There were no vitals filed for this visit.    Subjective Assessment - 07/13/21 1352     Subjective Pt had onset of neck muscle spasm with decreased range of motion and increased pain starting about 10 days ago. Insiduous onset with no known cause. Pt states pain is worse at end of the day and first thing in the morning when waking. She states pain decreases with use of hot pack.    Pertinent History OA, left frozen shoulder waiting on reverse total shoulder replacement    Limitations Lifting;House hold activities    Diagnostic tests x ray shows Grade 1 anterolisthesis of C4 on C5  and C5 on C6 is likely facet mediated.    Patient Stated Goals decrease  pain and improve range of motion    Currently in Pain? Yes    Pain Score 4     Pain Location Neck    Pain Orientation Posterior;Medial    Pain Descriptors / Indicators Sore;Aching    Pain Type Acute pain    Pain Onset 1 to 4 weeks ago    Pain Frequency Constant    Aggravating Factors  end of the day    Pain Relieving Factors heat                [facility] PT Assessment - 07/13/21 0001       Assessment   Medical Diagnosis neck muscle spasm    Referring Provider (PT) Sherene Sires    Onset Date/Surgical Date 07/03/21    Hand Dominance Right    Prior Therapy none      Precautions   Precautions None      Restrictions   Weight Bearing Restrictions No      Balance Screen   Has the patient fallen in the past 6 months No      Prior Function   Level of Independence Independent    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      Observation/Other Assessments   Focus on Therapeutic Outcomes (FOTO)  52      Posture/Postural Control   Posture Comments forward head      ROM / Strength   AROM / PROM / Strength AROM;Strength      AROM   AROM Assessment Site Cervical    Cervical Flexion 55    Cervical Extension 25    Cervical - Right Side Bend 35    Cervical - Left Side Bend 25    Cervical - Right Rotation 30    Cervical - Left Rotation 20      Strength   Overall Strength Comments shoulder strength grossly 3+/5 bilat      Palpation   Spinal mobility cervical joint mobility WFL for lateral and PA mobs    Palpation comment increased mm spasticity Lt > Rt cervical paraspinals, upper traps                        Objective measurements completed on examination: See above findings.       [facility] Adult PT Treatment/Exercise - 07/13/21 0001       Exercises   Exercises Neck      Neck Exercises: Seated   Cervical Rotation Both;5 reps  Other Seated Exercise cervical extension AROM 5 reps      Neck Exercises: Stretches   Upper Trapezius Stretch  Right;Left;30 seconds    Levator Stretch Right;Left;30 seconds      Modalities   Modalities Moist Heat      Moist Heat Therapy   Number Minutes Moist Heat 10 Minutes    Moist Heat Location Cervical      Manual Therapy   Manual Therapy Soft tissue mobilization    Soft tissue mobilization STM and TPR bilat upper traps and cervical paraspinals                     PT Education - 07/13/21 1422     Education Details PT POC and goals, HEP, dry needling    Person(s) Educated Patient    Methods Explanation;Demonstration;Handout    Comprehension Returned demonstration;Verbalized understanding                 PT Long Term Goals - 07/13/21 1535       PT LONG TERM GOAL #1   Title Pt will be independent with HEP    Time 6    Period Weeks    Status New    Target Date 08/24/21      PT LONG TERM GOAL #2   Title Pt will improve FOTO to >= 65 to demo improved functional mobility    Time 6    Period Weeks    Status New    Target Date 08/24/21      PT LONG TERM GOAL #3   Title Pt will improve cervical sidebending and rotation by 10 degrees each direction to improve ability to drive without difficulty    Time 6    Period Weeks    Status New    Target Date 08/24/21      PT LONG TERM GOAL #4   Title Pt will report pain <= 2/10 at the end of the day and when waking in the morning    Time 6    Period Weeks    Status New    Target Date 08/24/21                    Plan - 07/13/21 1532     Clinical Impression Statement Pt is a 75 y/o female referred for cervical muscle spasm. Pt presents with decreased cervical ROM, incrased mm spasticity, decreased functional mobility and activity tolerance. Pt will benefit from skilled PT to address deficits and improve functional ROM and mobility and decrease pain.    Personal Factors and Comorbidities Age;Past/Current Experience    Examination-Activity Limitations Lift;Reach Overhead;Caring for Others     Examination-Participation Restrictions Driving;Community Activity;Yard Work;Cleaning    Stability/Clinical Decision Making Stable/Uncomplicated    Clinical Decision Making Low    Rehab Potential Good    PT Frequency 2x / week    PT Duration 6 weeks    PT Treatment/Interventions Taping;Passive range of motion;Dry needling;Manual techniques;Patient/family education;Therapeutic exercise;Therapeutic activities;Neuromuscular re-education;Cryotherapy;Moist Heat;Electrical Stimulation;Traction;Iontophoresis 3m/ml Dexamethasone    PT Next Visit Plan assess HEP, dry needling if indicated, manua/modalities as needed    PT Home Exercise Plan [id number]   Consulted and Agree with Plan of Care Patient             Patient will benefit from skilled therapeutic intervention in order to improve the following deficits and impairments:  Pain, Postural dysfunction, Decreased strength, Decreased activity tolerance, Decreased range of motion, Increased muscle spasms  Visit Diagnosis: Cervicalgia - Plan: PT plan of care cert/re-cert  Other muscle spasm - Plan: PT plan of care cert/re-cert  Abnormal posture - Plan: PT plan of care cert/re-cert     Problem List Patient Active Problem List   Diagnosis Date Noted   Trapezius muscle spasm 07/08/2021   Tongue coating 07/08/2021   Gastroenteritis 06/03/2021   Left foot pain 02/03/2021   Degenerative arthritis of knee, bilateral 11/24/2020   Right ankle effusion 05/10/2020   Elevated alkaline phosphatase level 04/20/2020   Neuralgia 04/20/2020   Swelling 12/19/2018   Hair loss 11/04/2018   Spinal stenosis, 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/13/2018   Atypical chest pain 05/21/2018   Epigastric pain 05/21/2018   Baker's cyst of knee, right 05/07/2018   Perennial and seasonal allergic rhinitis 04/18/2018   Onychomycosis of toenail 03/04/2018   Hypercalcemia 09/17/2017   Left wrist pain 09/17/2017   Synovitis of left knee 05/22/2017    Diabetes ([place]) 05/02/2017   Trigger point of left shoulder region 03/27/2017   Taste disorder 02/22/2017   Anxiety and depression 02/12/2017   Caregiver with fatigue 01/17/2017   Psychosocial stressors 01/17/2017   Decreased sense of taste 01/15/2017   Family history of osteoporosis 11/16/2016   Atherosclerotic plaque 11/07/2016   Chronic or recurrent subluxation of peroneal tendon of left foot 09/13/2016   Osteoporosis 06/21/2016   Dizziness 05/16/2016   Arthritis of left shoulder region 01/17/2016   Ankylosing spondylitis ([place]) 11/08/2015   Incontinence 10/28/2015   Thoracic back pain 10/19/2015   Leg cramping 09/29/2015   Primary osteoarthritis of left shoulder 08/03/2015   Impingement syndrome of left shoulder region 09/22/2014   Interstitial cystitis 07/09/2014   Diverticulosis of colon without hemorrhage 07/08/2014   Carpal tunnel syndrome of left wrist 04/12/2014   Food allergy 03/06/2014   Renal calculi 01/30/2014   OSA (obstructive sleep apnea), moderate 01/14/2014   Allergic conjunctivitis 12/06/2013   History of Bell's palsy 02/26/2012   Vitamin D deficiency 06/27/2010   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   DRY MOUTH 06/27/2010   Gastroesophageal reflux disease 04/30/2009   History of colonic polyps 04/30/2009   Hyperlipidemia 08/21/2007   Essential hypertension 08/21/2007   Polyarthralgia 08/21/2007   Chronic lumbar radiculopathy 01/25/2007   PHYSICAL THERAPY DISCHARGE SUMMARY  Visits from Start of Care: 1  Current functional level related to goals / functional outcomes: No progress, eval only   Remaining deficits: See above   Education / Equipment: HEP   Patient agrees to discharge. Patient goals were not met. Patient is being discharged due to not returning since the last visit.  Isabelle Course, PT,DPT01/04/2309:55 AM   Isabelle Course, PT 07/13/2021, 3:49 PM  [facility] [place], [place], [address] Phone: [phone]   Fax:  [phone]  Name: [name] MRN: [id number] Date of Birth: 08/19/1945

## 2021-07-18 ENCOUNTER — Other Ambulatory Visit: Payer: Medicare Other

## 2021-07-19 ENCOUNTER — Ambulatory Visit: Payer: Medicare Other | Admitting: Physical Therapy

## 2021-07-20 ENCOUNTER — Encounter: Payer: Medicare Other | Admitting: Rehabilitative and Restorative Service Providers"

## 2021-07-26 ENCOUNTER — Other Ambulatory Visit: Payer: Medicare Other | Admitting: *Deleted

## 2021-07-26 ENCOUNTER — Other Ambulatory Visit: Payer: Self-pay

## 2021-07-26 DIAGNOSIS — I471 Supraventricular tachycardia: Secondary | ICD-10-CM | POA: Diagnosis not present

## 2021-07-26 DIAGNOSIS — I2510 Atherosclerotic heart disease of native coronary artery without angina pectoris: Secondary | ICD-10-CM | POA: Diagnosis not present

## 2021-07-26 DIAGNOSIS — I77810 Thoracic aortic ectasia: Secondary | ICD-10-CM | POA: Diagnosis not present

## 2021-07-26 DIAGNOSIS — E785 Hyperlipidemia, unspecified: Secondary | ICD-10-CM

## 2021-07-26 DIAGNOSIS — I351 Nonrheumatic aortic (valve) insufficiency: Secondary | ICD-10-CM | POA: Diagnosis not present

## 2021-07-26 DIAGNOSIS — I10 Essential (primary) hypertension: Secondary | ICD-10-CM | POA: Diagnosis not present

## 2021-07-26 LAB — BASIC METABOLIC PANEL
BUN/Creatinine Ratio: 16 (ref 12–28)
BUN: 16 mg/dL (ref 8–27)
CO2: 24 mmol/L (ref 20–29)
Calcium: 9.8 mg/dL (ref 8.7–10.3)
Chloride: 106 mmol/L (ref 96–106)
Creatinine, Ser: 0.99 mg/dL (ref 0.57–1.00)
Glucose: 105 mg/dL — ABNORMAL HIGH (ref 70–99)
Potassium: 4.4 mmol/L (ref 3.5–5.2)
Sodium: 143 mmol/L (ref 134–144)
eGFR: 59 mL/min/{1.73_m2} — ABNORMAL LOW (ref >59)

## 2021-07-26 LAB — MAGNESIUM: Magnesium: 2 mg/dL (ref 1.6–2.3)

## 2021-07-27 ENCOUNTER — Telehealth: Payer: Self-pay | Admitting: Cardiology

## 2021-07-27 NOTE — Telephone Encounter (Signed)
See result note.  

## 2021-07-27 NOTE — Telephone Encounter (Signed)
Patient was returning call for results 

## 2021-08-31 ENCOUNTER — Ambulatory Visit: Payer: Medicare Other | Admitting: Cardiology

## 2021-09-08 DIAGNOSIS — Z91199 Patient's noncompliance with other medical treatment and regimen due to unspecified reason: Secondary | ICD-10-CM | POA: Diagnosis not present

## 2021-09-08 DIAGNOSIS — G4733 Obstructive sleep apnea (adult) (pediatric): Secondary | ICD-10-CM | POA: Diagnosis not present

## 2021-09-20 ENCOUNTER — Other Ambulatory Visit: Payer: Self-pay

## 2021-09-20 ENCOUNTER — Ambulatory Visit (INDEPENDENT_AMBULATORY_CARE_PROVIDER_SITE_OTHER): Payer: Medicare Other

## 2021-09-20 ENCOUNTER — Ambulatory Visit (INDEPENDENT_AMBULATORY_CARE_PROVIDER_SITE_OTHER): Payer: Medicare Other | Admitting: Family Medicine

## 2021-09-20 VITALS — BP 158/100 | HR 81 | Ht 62.0 in | Wt 152.0 lb

## 2021-09-20 DIAGNOSIS — M2550 Pain in unspecified joint: Secondary | ICD-10-CM

## 2021-09-20 DIAGNOSIS — M79671 Pain in right foot: Secondary | ICD-10-CM | POA: Diagnosis not present

## 2021-09-20 DIAGNOSIS — R600 Localized edema: Secondary | ICD-10-CM | POA: Diagnosis not present

## 2021-09-20 DIAGNOSIS — M79674 Pain in right toe(s): Secondary | ICD-10-CM

## 2021-09-20 LAB — URIC ACID: Uric Acid, Serum: 4.5 mg/dL (ref 2.4–7.0)

## 2021-09-20 MED ORDER — PREDNISONE 50 MG PO TABS: ORAL_TABLET | ORAL | 0 refills | Status: DC

## 2021-09-20 MED ORDER — AMLODIPINE BESYLATE 2.5 MG PO TABS: 2.5000 mg | ORAL_TABLET | Freq: Every day | ORAL | 2 refills | Status: DC

## 2021-09-20 NOTE — Progress Notes (Signed)
I, Peterson Lombard, LAT, ATC acting as a scribe for Lynne Leader, MD.  [name] is a 76 y.o. female who presents to [place] at [facility] today for [employer] toe pain. She is under the care of a rheumatologist at [facility] in [place]. Pt was previously seen by Dr. Georgina Snell on 07/06/21 for polyarthralgia, esp neck and bilat legs. Today, pt c/o R Great toe pain ongoing since this morning. Pt reports waking up with sever R Great toe pain and was hardly able to stand. Pt locates pain to R 1st TMT joint. Pt notes she does a lot of sitting at her husband bedside at the hospital.  She notes she has been taking some 5 mg prednisone pills for this pain which does help quite a bit.  The last visit on December 7 she was referred to rheumatology here in [facility] for concern for seronegative rheumatoid arthritis but has never been called for further referral.  Swelling: no Aggravates: toe flex/ext, standing, walking Treatments tried: CAM walker boot, prednisone, Tylenol   Pertinent review of systems: No fevers or chills  Relevant historical information: Possible seronegative rheumatoid arthritis.   Exam:  BP (!) 158/100    Pulse 81    Ht 5\' 2"  (1.575 m)    Wt 152 lb (68.9 kg)    SpO2 98%    BMI 27.80 kg/m  General: Well Developed, well nourished, and in no acute distress.   MSK: Right foot slight swelling distally.  First MTP is tender to palpation with some swelling at MTP.  Small bunion formation is present.  Decreased range of motion to dorsiflexion and plantarflexion of MTP. Pulses and cap refill and sensation are intact distally.    Lab and Radiology Results  X-ray images right foot obtained today personally and independently interpreted DJD first MTP.  No acute fractures. Await formal radiology review  Lab Results  Component Value Date   LABURIC 3.9 05/10/2020       Assessment and Plan: 76 y.o. female with right great toe pain.  Etiology is  somewhat unclear at this time.  Pain could be due to DJD or perhaps gout or even the currently may be undiagnosed rheumatology problem that I think she has.  Plan to treat with burst of prednisone, continued immobilization with Cam walker boot, check uric acid and x-ray and recheck in 2 weeks.  Referral back to rheumatology as a last referral there was a miscommunication issue.   PDMP not reviewed this encounter. Orders Placed This Encounter  Procedures   DG Foot Complete Right    Standing Status:   Future    Standing Expiration Date:   09/20/2022    Order Specific Question:   Reason for Exam (SYMPTOM  OR DIAGNOSIS REQUIRED)    Answer:   right foot pain    Order Specific Question:   Preferred imaging location?    Answer:   [facility]   Uric acid    Standing Status:   Future    Standing Expiration Date:   09/20/2022   Ambulatory referral to Rheumatology    Referral Priority:   Routine    Referral Type:   Consultation    Referral Reason:   Specialty Services Required    Requested Specialty:   Rheumatology    Number of Visits Requested:   1   Meds ordered this encounter  Medications   predniSONE (DELTASONE) 50 MG tablet    Sig: Take 1 pill daily  for 5 days    Dispense:  5 tablet    Refill:  0     Discussed warning signs or symptoms. Please see discharge instructions. Patient expresses understanding.   The above documentation has been reviewed and is accurate and complete Lynne Leader, M.D.

## 2021-09-20 NOTE — Patient Instructions (Addendum)
Thank you for coming in today.   Please get labs today before you leave   Please get an Xray today before you leave   I've sent the prednisone to your pharmacy  I've placed a new referral to [facility]. If you don't hear from them about scheduling within 1 week, please let me know!  Recheck back 2 weeks

## 2021-09-21 NOTE — Progress Notes (Signed)
Uric acid is 7.5.  This makes gout very unlikely. The foot x-ray radiology read is still pending however I thought there was some arthritis in there.  We will see what the radiologist has to say.

## 2021-09-22 NOTE — Progress Notes (Signed)
Right foot x-ray shows some arthritis at the big toe joint and a bunion present.

## 2021-09-26 DIAGNOSIS — Z20822 Contact with and (suspected) exposure to covid-19: Secondary | ICD-10-CM | POA: Diagnosis not present

## 2021-10-04 ENCOUNTER — Ambulatory Visit: Payer: Medicare Other | Admitting: Family Medicine

## 2021-10-05 ENCOUNTER — Ambulatory Visit (INDEPENDENT_AMBULATORY_CARE_PROVIDER_SITE_OTHER): Payer: Medicare Other | Admitting: Family Medicine

## 2021-10-05 ENCOUNTER — Other Ambulatory Visit: Payer: Self-pay

## 2021-10-05 VITALS — BP 160/88 | HR 84 | Ht 62.0 in | Wt 157.2 lb

## 2021-10-05 DIAGNOSIS — M2550 Pain in unspecified joint: Secondary | ICD-10-CM

## 2021-10-05 DIAGNOSIS — Z20828 Contact with and (suspected) exposure to other viral communicable diseases: Secondary | ICD-10-CM | POA: Diagnosis not present

## 2021-10-05 DIAGNOSIS — M62838 Other muscle spasm: Secondary | ICD-10-CM

## 2021-10-05 MED ORDER — TIZANIDINE HCL 4 MG PO TABS: 4.0000 mg | ORAL_TABLET | Freq: Four times a day (QID) | ORAL | 1 refills | Status: DC | PRN

## 2021-10-05 NOTE — Patient Instructions (Addendum)
Thank you for coming in today.  ? ?Try using a heating pad ? ?Let me know if you do not hear from rheumatology office within 1 weeks. ? ?TENS UNIT: ?This is helpful for muscle pain and spasm.  ? ?Search and Purchase a TENS 7000 2nd edition at  ?www.tenspros.com or www.[employer].com ?It should be less than $30.  ? ? ? ?TENS unit instructions: Do not shower or bathe with the unit on ?Turn the unit off before removing electrodes or batteries ?If the electrodes lose stickiness add a drop of water to the electrodes after they are disconnected from the unit and place on plastic sheet. If you continued to have difficulty, call the TENS unit company to purchase more electrodes. ?Do not apply lotion on the skin area prior to use. Make sure the skin is clean and dry as this will help prolong the life of the electrodes. ?After use, always check skin for unusual red areas, rash or other skin difficulties. If there are any skin problems, does not apply electrodes to the same area. ?Never remove the electrodes from the unit by pulling the wires. ?Do not use the TENS unit or electrodes other than as directed. ?Do not change electrode placement without consultating your therapist or physician. ?Keep 2 fingers with between each electrode. ?Wear time ratio is 2:1, on to off times.   ? ?For example on for 30 minutes off for 15 minutes and then on for 30 minutes off for 15 minutes ? ?  ?

## 2021-10-05 NOTE — Progress Notes (Signed)
? ?I, Peterson Lombard, LAT, ATC acting as a scribe for [name] Leader, MD. ? ?[name] is a 76 y.o. female who presents to [place] at [facility] today for f/u  R Great toe pain. She is under the care of a rheumatologist at [facility] in [place]. Pt was last seen by Dr. Georgina Snell on 09/20/21 and was prescribed prednisone and advised to cont immobilization w/ CAM walker boot and labs were checked. Pt was also referred back to rheumatology as there was a mis-communication w/ the prior referral. Today, pt reports R great toe is feeling better. Pt has not yet been contacted by rheumatology. Pt has been spending a lot of time at the rehab center w/ her husband, who will be d/c tomorrow. Pt was sitting in a recliner last night and felt a sharp pain in her neck and R shoulder. Pt also c/o R groin pain that started 3-4 days ago.  ? ?Dx testing: 09/20/21 Labs ? 09/20/21 R foot XR ? ?Pertinent review of systems: No fevers or chills ? ?Relevant historical information: Hypertension and diabetes ? ? ?Exam:  ?BP (!) 160/88   Pulse 84   Ht '5\' 2"'$  (1.575 m)   Wt 157 lb 3.2 oz (71.3 kg)   SpO2 97%   BMI 28.75 kg/m?  ?General: Well Developed, well nourished, and in no acute distress.  ? ?MSK: Cervical spine: Normal-appearing ?Nontender midline. ?Tender palpation right cervical paraspinal musculature. ?Decreased cervical motion. ?Upper extremity strength is intact. ? ? ? ? ?Lab and Radiology Results ?EXAM: ?CERVICAL SPINE - COMPLETE 4+ VIEW ?  ?COMPARISON:  None available. ?  ?FINDINGS: ?The bones are subjectively under mineralized. There is minimal ?stepwise anterolisthesis of C4 on C5 and C5 on C6. Disc space ?narrowing and endplate spurring at K8-J6, lesser C6-C7. ?Mild-to-moderate multilevel facet hypertrophy. No high-grade bony ?neural foraminal narrowing. No evidence of fracture, focal bone ?lesion or bone destruction. No prevertebral soft tissue thickening. ?Lung apices are clear. ?  ?IMPRESSION: ?1.  Lower cervical degenerative disc disease. ?2. Multilevel facet hypertrophy. Grade 1 anterolisthesis of C4 on C5 ?and C5 on C6 is likely facet mediated. ?3. Subjective osteopenia/osteoporosis. ?  ?  ?Electronically Signed ?  By: Keith Rake M.D. ?  On: 07/08/2021 11:21 ?  ?I, [name], personally (independently) visualized and performed the interpretation of the images attached in this note. ? ? ? ? ? ?Assessment and Plan: ?76 y.o. female with sided neck pain and trapezius pain thought to be due to muscle spasm and dysfunction of cervical paraspinal musculature and trapezius.  Recommend heating pad and TENS unit and tizanidine.  Unfortunately I do not think she is a great candidate for physical therapy right now because she is trying to care for her sick husband who will be returning from inpatient rehab to home now. ? ?Additionally her foot which she was seen for recently is now feeling better after course of prednisone however the fundamental underlying cause of her pain is unclear.  I think she probably has a rheumatologic cause and I did refer her to rheumatology but she has not heard from [facility] rheumatology yet.  I called the office myself during the office visit and left a message with the scheduling coordinator.  Hopefully she will be able to get an appointment.  I have asked her to let me know if she does not hear anything in the next week. ? ? ?PDMP not reviewed this encounter. ?No orders of the  defined types were placed in this encounter. ? ?Meds ordered this encounter  ?Medications  ? tiZANidine (ZANAFLEX) 4 MG tablet  ?  Sig: Take 1 tablet (4 mg total) by mouth every 6 (six) hours as needed for muscle spasms.  ?  Dispense:  30 tablet  ?  Refill:  1  ? ? ? ?Discussed warning signs or symptoms. Please see discharge instructions. Patient expresses understanding. ? ? ?The above documentation has been reviewed and is accurate and complete [name], M.D. ? ? ?

## 2021-10-11 DIAGNOSIS — M62838 Other muscle spasm: Secondary | ICD-10-CM | POA: Diagnosis not present

## 2021-10-11 DIAGNOSIS — M25512 Pain in left shoulder: Secondary | ICD-10-CM | POA: Diagnosis not present

## 2021-10-11 DIAGNOSIS — R768 Other specified abnormal immunological findings in serum: Secondary | ICD-10-CM | POA: Diagnosis not present

## 2021-10-11 DIAGNOSIS — Z20822 Contact with and (suspected) exposure to covid-19: Secondary | ICD-10-CM | POA: Diagnosis not present

## 2021-10-11 DIAGNOSIS — M25511 Pain in right shoulder: Secondary | ICD-10-CM | POA: Diagnosis not present

## 2021-10-11 DIAGNOSIS — M1990 Unspecified osteoarthritis, unspecified site: Secondary | ICD-10-CM | POA: Diagnosis not present

## 2021-10-11 DIAGNOSIS — M79671 Pain in right foot: Secondary | ICD-10-CM | POA: Diagnosis not present

## 2021-10-11 DIAGNOSIS — Z1589 Genetic susceptibility to other disease: Secondary | ICD-10-CM | POA: Diagnosis not present

## 2021-10-11 DIAGNOSIS — M79642 Pain in left hand: Secondary | ICD-10-CM | POA: Diagnosis not present

## 2021-10-11 DIAGNOSIS — M2550 Pain in unspecified joint: Secondary | ICD-10-CM | POA: Diagnosis not present

## 2021-10-11 DIAGNOSIS — M549 Dorsalgia, unspecified: Secondary | ICD-10-CM | POA: Diagnosis not present

## 2021-10-11 DIAGNOSIS — M79673 Pain in unspecified foot: Secondary | ICD-10-CM | POA: Diagnosis not present

## 2021-10-11 DIAGNOSIS — M79641 Pain in right hand: Secondary | ICD-10-CM | POA: Diagnosis not present

## 2021-10-11 DIAGNOSIS — M7989 Other specified soft tissue disorders: Secondary | ICD-10-CM | POA: Diagnosis not present

## 2021-10-11 DIAGNOSIS — M79672 Pain in left foot: Secondary | ICD-10-CM | POA: Diagnosis not present

## 2021-10-18 ENCOUNTER — Other Ambulatory Visit (HOSPITAL_BASED_OUTPATIENT_CLINIC_OR_DEPARTMENT_OTHER): Payer: Self-pay

## 2021-10-18 NOTE — Progress Notes (Signed)
? ?Cardiology Office Note   ? ?Date:  10/21/2021  ? ?ID:  [name], DOB Feb 14, 1946, MRN [id number] ? ?PCP:  Binnie Rail, MD  ?Cardiologist:  Previously Dr. Ena Dawley, patient has requested to f/u with me ?Electrophysiologist:  None  ? ?Chief Complaint: f/u CAD ? ?History of Present Illness:  ? ?[name] is a 76 y.o. female with history of moderate CAD by coronary CT, aortic atherosclerosis, mild-moderate AI by echo 06/2020, mildly dilated ascending aorta, HTN, PSVT, ankylosing spondylitis/HLA B27 positive, anxiety, osteoarthritis, Bell palsy, diverticulosis, pancreatic insufficiency, HLD, HTN, interstitial cystitis, OA, pre-DM, OSA on CPAP (followed at [facility]), mild anemia who presents for routine cardiology evaluation. [name] (who has a complex medical history) and daughter [name] are also followed by our clinic. ?  ?She previously followed with Dr. Meda Coffee for history of atypical sharp chest pain and nonobstructive CAD. Last ischemic assessment was by coronary CTA in 07/2020 with moderate stenosis in the prox and distal RCA, mild stenosis in the prox LAD with negative FFR, no abnormalities mentioned in aorta. Event monitor showed NSR with short runs of SVT, rare PVCs, PACs, average HR 75bpm, range 42-176 (low reading was during sleeping hours). Palpitations improved without intervention. She previously followed with the lipid clinic in the past for cholesterol management but ultimately had been generally tolerating rosuvastatin 1m. She has a history of mylagias/RLS although it was not clear this was specifically related to the statin itself. LE arterial testing 2020 was normal. Last echo 06/30/21 showed EF 60-65%, grade 1 Dd, mild-moderate AI, mild dilation of ascending aorta, stable from prior.  In a previous year she had reported lower extremity edema and facial edema not markedly appreciated at that time on exam. Amlodipine was changed to HCTZ but eventually switched back at a later  time by primary care as she had muscle cramping -> in 06/2021 she wished to revisit diuretic for trace edema so was trialed on spironolactone but felt poorly with this (felt like she was not able to void) so went back to amlodipine. Her rosuvastatin was also decreased to 143mdaily due to muscle cramping. ? ?She returns for follow-up today. She continues to be under increased stress as she continues to care for husband Joe. He had a significant hospitalization with a brain bleed and was officially diagnosed with Lewy-Body dementia. He is now back home and making slow strides with home health. [name] noticed intermittent bouts of lightheadedness about once a day, fleeting in nature, not definitively orthostatic in nature - can happen at random times. Sometimes may not happen on a certain day. She does not really recall when this began given that she's been preoccupied caring for Joe. No associated CP, SOB, palpitations or syncope with this though she does periodically still get the sharp twinges of discomfort she's had for years. No specific anginal-type symptoms. She has not checked her VS during these episodes. Her cramping does seem to have improved somewhat with the decrease in rosuvastatin. She still notices intermittent sockline edema at times that may persisted after socks removed. She is also seeing a new rheumatologist. ? ?Labwork independently reviewed: ?07/08/2021 labs WBC wnl Hgb 11.5, Plt 288, LFTS wnl, lipase 67, sed rate 36, Mg 2.0, K 4.4, Cr 0.99 ?05/2021 LDL 23, trig 121 ?11/2020 Mg 2.0, K 4.1, Cr 0.99, LFTs ok, A1C 6.3, thyroid normal ?09/2020 LDL 59 ? ? ?Cardiology Studies:  ? ?Studies reviewed are outlined and summarized above. Reports included below  if pertinent.  ? ?Cor CTA 07/2020 ?FINDINGS: ?A 100 kV prospective scan was triggered in the descending thoracic ?aorta at 111 HU's. Axial non-contrast 3 mm slices were carried out ?through the heart. The data set was analyzed on a dedicated  work ?station and scored using the [employer]. Gantry rotation speed ?was 250 msecs and collimation was .6 mm. 50 mg of PO Metoprolol and ?0.8 mg of sl NTG was given. The 3D data set was reconstructed in 5% ?intervals of the 67-82 % of the R-R cycle. Diastolic phases were ?analyzed on a dedicated work station using MPR, MIP and VRT modes. ?The patient received 80 cc of contrast. ?  ?Aorta: Normal size. Minimal atherosclerotic plaque and ?calcifications. No dissection. ?  ?Aortic Valve:  Trileaflet.  Trivial calcifications. ?  ?Coronary Arteries:  Normal coronary origin.  Right dominance. ?  ?RCA is a large dominant artery that gives rise to PDA and PLA. There ?is moderate diffuse calcified plaque in the proximal portion with ?stenosis 50-69%. Distal RCA has moderate mixed plaque with stenosis ?50-69%. PLA and PDA are small with minimal plaque. ?  ?Left main is a large artery that gives rise to LAD and LCX arteries. ?Left main is a large vessel with only luminal irregularities. ?  ?LAD is a large vessel that gives rise to one diagonal artery. There ?is mild predominantly calcified plaque in the proximal portion with ?stenosis 25-49%. Mid and distal LAD have only luminal ?irregularities. ?  ?D1 is small and has mild calcified focal stenosis in the proximal ?portion with stenosis 25-49%. ?  ?LCX is a non-dominant artery that gives rise to one large OM1 ?branch. There are only minimal irregularities. ?  ?Other findings: ?  ?Normal pulmonary vein drainage into the left atrium. ?  ?Normal left atrial appendage without a thrombus. ?  ?Normal size of the pulmonary artery. ?  ?IMPRESSION: ?1. Coronary calcium score of 199. This was 71 percentile for age and ?sex matched control. In 2018 it was calcium score of 56 (59 ?percentile for age and sex matched control). ?  ?2. Normal coronary origin with right dominance. ?  ?3. CAD-RADS 3. Moderate stenosis in the proximal and distal RCA, ?mild stenosis in the proximal LAD.  Consider symptom-guided ?anti-ischemic pharmacotherapy as well as risk factor modification ?per guideline directed care. Additional analysis with CT FFR will be ?submitted. There is progression of disease since the prior CCTA in ?2018. ?  ?FINDINGS: ?FFRct analysis was performed on the original cardiac CT angiogram ?dataset. Diagrammatic representation of the FFRct analysis is ?provided in a separate PDF document in PACS. This dictation was ?created using the PDF document and an interactive 3D model of the ?results. 3D model is not available in the EMR/PACS. Normal FFR range ?is >0.80. ?  ?1. Left Main: 0.99. ?  ?2. LAD: Proximal: 0.98, mid: 0.94, distal: 0.90. ?3. D1: 0.96. ?4. LCX: 0.99. ?5. OM1: 0.98 ?6. RCA: Proximal: 0.99, mid: 0.94, distal: 0.88. ?  ?IMPRESSION: ?1. CT FFR analysis didn't show any significant stenosis. Intensify ?medical management to slow down disease progression. ?  ?  ?Electronically Signed ?  By: Ena Dawley ?  On: 08/26/2020 22:37 ?  ?Monitor 07/2020 ?Patient had a min HR of 42 bpm, max HR of 176 bpm, and avg HR of 75 bpm ?11 very short Supraventricular Tachycardia runs occurred, the run with the fastest interval lasting 4 beats with a max rate of 176 bpm, the longest lasting 10 beats with an avg  rate of 114 bpm. ?Rare PACs and PVCs. ?  ?Predominant underlying rhythm was Sinus Rhythm. 11 very short Supraventricular Tachycardia runs occurred. the run with the fastest interval lasting 4 beats with a max rate of 176 bpm, the  ?longest lasting 10 beats with an avg rate of 114 bpm.  ?  ?2D echo 06/2020 ? ? ? 1. Left ventricular ejection fraction, by estimation, is 60 to 65%. The  ?left ventricle has normal function. The left ventricle has no regional  ?wall motion abnormalities. There is mild concentric left ventricular  ?hypertrophy. Left ventricular diastolic  ?parameters are consistent with Grade I diastolic dysfunction (impaired  ?relaxation).  ? 2. Right ventricular systolic function  is normal. The right ventricular  ?size is normal.  ? 3. The mitral valve is normal in structure. No evidence of mitral valve  ?regurgitation. No evidence of mitral stenosis.  ? 4. The aortic valve is tricuspid.

## 2021-10-21 ENCOUNTER — Encounter: Payer: Self-pay | Admitting: Physician Assistant

## 2021-10-21 ENCOUNTER — Other Ambulatory Visit: Payer: Self-pay

## 2021-10-21 ENCOUNTER — Ambulatory Visit (INDEPENDENT_AMBULATORY_CARE_PROVIDER_SITE_OTHER): Payer: Medicare Other | Admitting: Physician Assistant

## 2021-10-21 ENCOUNTER — Telehealth: Payer: Self-pay | Admitting: Physician Assistant

## 2021-10-21 ENCOUNTER — Ambulatory Visit (INDEPENDENT_AMBULATORY_CARE_PROVIDER_SITE_OTHER): Payer: Medicare Other

## 2021-10-21 VITALS — BP 138/80 | HR 60 | Ht 62.5 in | Wt 154.0 lb

## 2021-10-21 DIAGNOSIS — I2510 Atherosclerotic heart disease of native coronary artery without angina pectoris: Secondary | ICD-10-CM

## 2021-10-21 DIAGNOSIS — R42 Dizziness and giddiness: Secondary | ICD-10-CM

## 2021-10-21 DIAGNOSIS — I471 Supraventricular tachycardia, unspecified: Secondary | ICD-10-CM

## 2021-10-21 DIAGNOSIS — I77810 Thoracic aortic ectasia: Secondary | ICD-10-CM

## 2021-10-21 DIAGNOSIS — I10 Essential (primary) hypertension: Secondary | ICD-10-CM | POA: Diagnosis not present

## 2021-10-21 DIAGNOSIS — E785 Hyperlipidemia, unspecified: Secondary | ICD-10-CM

## 2021-10-21 DIAGNOSIS — R252 Cramp and spasm: Secondary | ICD-10-CM

## 2021-10-21 DIAGNOSIS — I351 Nonrheumatic aortic (valve) insufficiency: Secondary | ICD-10-CM | POA: Diagnosis not present

## 2021-10-21 LAB — CBC
Hematocrit: 35.9 % (ref 34.0–46.6)
Hemoglobin: 12 g/dL (ref 11.1–15.9)
MCH: 29.1 pg (ref 26.6–33.0)
MCHC: 33.4 g/dL (ref 31.5–35.7)
MCV: 87 fL (ref 79–97)
Platelets: 297 10*3/uL (ref 150–450)
RBC: 4.13 x10E6/uL (ref 3.77–5.28)
RDW: 13 % (ref 11.7–15.4)
WBC: 6 10*3/uL (ref 3.4–10.8)

## 2021-10-21 LAB — COMPREHENSIVE METABOLIC PANEL
ALT: 20 IU/L (ref 0–32)
AST: 21 IU/L (ref 0–40)
Albumin/Globulin Ratio: 1.6 (ref 1.2–2.2)
Albumin: 4.5 g/dL (ref 3.7–4.7)
Alkaline Phosphatase: 111 IU/L (ref 44–121)
BUN/Creatinine Ratio: 18 (ref 12–28)
BUN: 17 mg/dL (ref 8–27)
Bilirubin Total: 0.4 mg/dL (ref 0.0–1.2)
CO2: 26 mmol/L (ref 20–29)
Calcium: 9.8 mg/dL (ref 8.7–10.3)
Chloride: 105 mmol/L (ref 96–106)
Creatinine, Ser: 0.93 mg/dL (ref 0.57–1.00)
Globulin, Total: 2.8 g/dL (ref 1.5–4.5)
Glucose: 114 mg/dL — ABNORMAL HIGH (ref 70–99)
Potassium: 4.3 mmol/L (ref 3.5–5.2)
Sodium: 142 mmol/L (ref 134–144)
Total Protein: 7.3 g/dL (ref 6.0–8.5)
eGFR: 63 mL/min/{1.73_m2} (ref >59)

## 2021-10-21 LAB — CK: Total CK: 245 U/L — ABNORMAL HIGH (ref 32–182)

## 2021-10-21 LAB — LIPID PANEL
Chol/HDL Ratio: 2 ratio (ref 0.0–4.4)
Cholesterol, Total: 123 mg/dL (ref 100–199)
HDL: 61 mg/dL (ref >39)
LDL Chol Calc (NIH): 43 mg/dL (ref 0–99)
Triglycerides: 107 mg/dL (ref 0–149)
VLDL Cholesterol Cal: 19 mg/dL (ref 5–40)

## 2021-10-21 LAB — TSH: TSH: 2.4 u[IU]/mL (ref 0.450–4.500)

## 2021-10-21 NOTE — Patient Instructions (Signed)
Medication Instructions:  ?Your physician recommends that you continue on your current medications as directed. Please refer to the Current Medication list given to you today. ?*If you need a refill on your cardiac medications before your next appointment, please call your pharmacy* ? ? ?Lab Work: ?Your physician recommends that you return for lab work in: TODAY- CMET, Lipids, CBC, TSH, CK ?If you have labs (blood work) drawn today and your tests are completely normal, you will receive your results only by: ?MyChart Message (if you have MyChart) OR ?A paper copy in the mail ?If you have any lab test that is abnormal or we need to change your treatment, we will call you to review the results. ? ? ?Testing/Procedures: ?ZIO XT- Long Term Monitor Instructions ? ?Your physician has requested you wear a ZIO patch monitor for 14 days.  ?This is a single patch monitor. [employer] supplies one patch monitor per enrollment. Additional ?stickers are not available. Please do not apply patch if you will be having a Nuclear Stress Test,  ?Echocardiogram, Cardiac CT, MRI, or Chest Xray during the period you would be wearing the  ?monitor. The patch cannot be worn during these tests. You cannot remove and re-apply the  ?ZIO XT patch monitor.  ?Your ZIO patch monitor will be mailed 3 day USPS to your address on file. It may take 3-5 days  ?to receive your monitor after you have been enrolled.  ?Once you have received your monitor, please review the enclosed instructions. Your monitor  ?has already been registered assigning a specific monitor serial # to you. ? ?Billing and Patient Assistance Program Information ? ?We have supplied [employer] with any of your insurance information on file for billing purposes. ?[employer] offers a sliding scale Patient Assistance Program for patients that do not have  ?insurance, or whose insurance does not completely cover the cost of the ZIO monitor.  ?You must apply for the Patient Assistance Program to  qualify for this discounted rate.  ?To apply, please call [employer] at [phone], select option 4, select option 2, ask to apply for  ?Patient Assistance Program. Theodore Demark will ask your household income, and how many people  ?are in your household. They will quote your out-of-pocket cost based on that information.  ?[employer] will also be able to set up a 73-month interest-free payment plan if needed. ? ?Applying the monitor ?  ?Shave hair from upper left chest.  ?Hold abrader disc by orange tab. Rub abrader in 40 strokes over the upper left chest as  ?indicated in your monitor instructions.  ?Clean area with 4 enclosed alcohol pads. Let dry.  ?Apply patch as indicated in monitor instructions. Patch will be placed under collarbone on left  ?side of chest with arrow pointing upward.  ?Rub patch adhesive wings for 2 minutes. Remove white label marked "1". Remove the white  ?label marked "2". Rub patch adhesive wings for 2 additional minutes.  ?While looking in a mirror, press and release button in center of patch. A small green light will  ?flash 3-4 times. This will be your only indicator that the monitor has been turned on.  ?Do not shower for the first 24 hours. You may shower after the first 24 hours.  ?Press the button if you feel a symptom. You will hear a small click. Record Date, Time and  ?Symptom in the Patient Logbook.  ?When you are ready to remove the patch, follow instructions on the last 2 pages of Patient  ?Logbook. Stick  patch monitor onto the last page of Patient Logbook.  ?Place Patient Logbook in the blue and white box. Use locking tab on box and tape box closed  ?securely. The blue and white box has prepaid postage on it. Please place it in the mailbox as  ?soon as possible. Your physician should have your test results approximately 7 days after the  ?monitor has been mailed back to [facility]  ?Call [facility] at [phone] if you have questions regarding  ?your ZIO XT  patch monitor. Call them immediately if you see an orange light blinking on your  ?monitor.  ?If your monitor falls off in less than 4 days, contact our Monitor department at [phone].  ?If your monitor becomes loose or falls off after 4 days call [employer] at [phone] for  ?suggestions on securing your monitor ? ? ? ?Follow-Up: ?At [facility], you and your health needs are our priority.  As part of our continuing mission to provide you with exceptional heart care, we have created designated Provider Care Teams.  These Care Teams include your primary Cardiologist (physician) and Advanced Practice Providers (APPs -  Physician Assistants and Nurse Practitioners) who all work together to provide you with the care you need, when you need it. ? ?We recommend signing up for the patient portal called "MyChart".  Sign up information is provided on this After Visit Summary.  MyChart is used to connect with patients for Virtual Visits (Telemedicine).  Patients are able to view lab/test results, encounter notes, upcoming appointments, etc.  Non-urgent messages can be sent to your provider as well.   ?To learn more about what you can do with MyChart, go to [email or web address].   ? ?Your next appointment:   ?3 month(s) ? ?The format for your next appointment:   ?In Person ? ?Provider:   ?Melina Copa, PA-C ? ? ? ? ?Other Instructions ?  ?

## 2021-10-21 NOTE — Progress Notes (Unsigned)
Applied a 14 day Zio XT monitor to patient in the office ? ?Dr Johney Frame to read ? ?[id number] fell off in 3 days, replaced with [id number] from office inventory. ?

## 2021-10-21 NOTE — Telephone Encounter (Signed)
CK mildly elevated at 245. Will stop statin for now; await remainder of labs. ?D/w patient by phone. She verbalized understanding and gratitude. ?

## 2021-10-27 ENCOUNTER — Other Ambulatory Visit: Payer: Self-pay

## 2021-10-27 DIAGNOSIS — I471 Supraventricular tachycardia: Secondary | ICD-10-CM

## 2021-10-27 DIAGNOSIS — R42 Dizziness and giddiness: Secondary | ICD-10-CM

## 2021-11-03 DIAGNOSIS — Z20822 Contact with and (suspected) exposure to covid-19: Secondary | ICD-10-CM | POA: Diagnosis not present

## 2021-11-11 DIAGNOSIS — R42 Dizziness and giddiness: Secondary | ICD-10-CM | POA: Diagnosis not present

## 2021-11-25 ENCOUNTER — Telehealth: Payer: Self-pay | Admitting: Cardiology

## 2021-11-25 NOTE — Telephone Encounter (Signed)
Updated BP readings for [name] Copa, PA ? ?10/26/21: 132/73 HR 76 sitting ?11/18/21: 142/93 HR 65 sitting ?11/18/21: 169/119 HR 70 standing ?11/18/21: 147/85 HR 55 laying down ?11/18/21: 140/93 HR 77 standing ?11/18/21: 138/89 HR 77 sitting ?11/18/21: 139/88 HR 83 standing ?11/18/21: 141/94 HR 81 sitting ?11/19/21: 122/55 laying down ?11/20/21: 153/83 HR 63 sitting ?11/20/21: 147/83 hR 61 sitting ?11/20/21: 169/96 HR 82 standing ?11/21/21: 124/72 HR 71  ?11/21/21 132/78 HR 72 sitting ?11/21/21: 116/85 HR 85 standing ?11/21/21: 134/83 HR 79 ?11/21/21: 130/81 HR 85 sitting ?11/21/21: 134/89 HR 89 standing ?11/22/21: 124/75 HR 67 sitting ?11/22/21: 123/77 HR 82 standing ?11/23/21: 126/80 HR 85 sitting ?11/24/21: 157/91 HR 70 sitting ?11/25/21: 131/80 HR 76 sitting ?11/25/21: 136/89 HR 94 ?

## 2021-11-28 NOTE — Telephone Encounter (Signed)
See result note on 11/11/21 monitor, let's try carvedilol 3.'125mg'$  BID and f/u June as planned. ?

## 2021-11-29 MED ORDER — CARVEDILOL 3.125 MG PO TABS: 3.1250 mg | ORAL_TABLET | Freq: Two times a day (BID) | ORAL | 1 refills | Status: DC

## 2021-11-29 NOTE — Telephone Encounter (Signed)
Patient has been notified directly; all questions, if any, were answered. Patient voiced understanding.   

## 2021-12-05 DIAGNOSIS — Z20822 Contact with and (suspected) exposure to covid-19: Secondary | ICD-10-CM | POA: Diagnosis not present

## 2021-12-07 DIAGNOSIS — U071 COVID-19: Secondary | ICD-10-CM | POA: Diagnosis not present

## 2021-12-28 DIAGNOSIS — H25012 Cortical age-related cataract, left eye: Secondary | ICD-10-CM | POA: Diagnosis not present

## 2021-12-28 DIAGNOSIS — H2512 Age-related nuclear cataract, left eye: Secondary | ICD-10-CM | POA: Diagnosis not present

## 2021-12-28 DIAGNOSIS — Z961 Presence of intraocular lens: Secondary | ICD-10-CM | POA: Diagnosis not present

## 2021-12-28 DIAGNOSIS — H25042 Posterior subcapsular polar age-related cataract, left eye: Secondary | ICD-10-CM | POA: Diagnosis not present

## 2022-01-06 ENCOUNTER — Telehealth: Payer: Self-pay | Admitting: Internal Medicine

## 2022-01-06 ENCOUNTER — Ambulatory Visit (INDEPENDENT_AMBULATORY_CARE_PROVIDER_SITE_OTHER): Payer: Medicare Other | Admitting: Internal Medicine

## 2022-01-06 ENCOUNTER — Encounter: Payer: Self-pay | Admitting: Internal Medicine

## 2022-01-06 DIAGNOSIS — L0231 Cutaneous abscess of buttock: Secondary | ICD-10-CM | POA: Diagnosis not present

## 2022-01-06 DIAGNOSIS — I2510 Atherosclerotic heart disease of native coronary artery without angina pectoris: Secondary | ICD-10-CM

## 2022-01-06 MED ORDER — DOXYCYCLINE HYCLATE 100 MG PO TABS: 100.0000 mg | ORAL_TABLET | Freq: Two times a day (BID) | ORAL | 0 refills | Status: AC

## 2022-01-06 MED ORDER — CEFTRIAXONE SODIUM 1 G IJ SOLR
1.0000 g | Freq: Once | INTRAMUSCULAR | Status: AC
  Administered 2022-01-06: 1 g via INTRAMUSCULAR

## 2022-01-06 MED ORDER — EPINEPHRINE 0.3 MG/0.3ML IJ SOAJ: INTRAMUSCULAR | 1 refills | Status: DC

## 2022-01-06 MED ORDER — ONDANSETRON HCL 4 MG PO TABS: 4.0000 mg | ORAL_TABLET | Freq: Three times a day (TID) | ORAL | 0 refills | Status: DC | PRN

## 2022-01-06 MED ORDER — OXYCODONE-ACETAMINOPHEN 5-325 MG PO TABS: 1.0000 | ORAL_TABLET | ORAL | 0 refills | Status: DC | PRN

## 2022-01-06 NOTE — Telephone Encounter (Signed)
Patient had a message to call Mardene Celeste at this number - she is hoping you will know what this is about.  Please give patient a call

## 2022-01-06 NOTE — Patient Instructions (Addendum)
    An antibiotic injection was given.      Medications changes include :   doxycycline twice daily x 2 weeks, zofran for nausea if needed.  Pain medication   Your prescription(s) have been sent to your pharmacy.    Referral for surgery.     Return if symptoms worsen or fail to improve.

## 2022-01-06 NOTE — Assessment & Plan Note (Signed)
Acute started 5 days ago as a small red dot on her left buttock that was the size of a pea and has since significantly gotten worse Area is very painful, indurated without fluctuance Possible chills, but no fever Had similar infection in the same area about 18 months ago-at that time she thought it was a bug bite but there is no known bug bite Concern for underlying cause Will order CT scan Ceftriaxone 2 g IM x1 Doxycycline 100 mg twice daily x2 weeks Refer to surgery Advised warm compresses Oxycodone 5-325 1 tablet every 4 hours as needed Zofran 4 mg every 8 hours as needed for nausea related to pain medication Follow-up early next week

## 2022-01-06 NOTE — Progress Notes (Signed)
Subjective:    Patient ID: [name], female    DOB: 1946-06-20, 76 y.o.   MRN: [id number]      HPI [name] is here for  Chief Complaint  Patient presents with   Insect Bite    Bug bite left lower buttock area; Red, painful and swollen     ? Bug bite/infection left buttock.  She noticed it 5 days ago.  She was sitting down and reading a book and felt discomfort in the left buttock region-she had a red dot that was very small - size of a pea.  She denies any itching.  It was painful and over the past 5 days it is getting bigger and more painful.  There is no drainage.  Yesterday she felt cold which is unusual - mild chills.  She denies obvious fevers.  Had something similar 18 months ago and she thought it was a bug bite.  It took a while to go away.     Medications and allergies reviewed with patient and updated if appropriate.  Current Outpatient Medications on File Prior to Visit  Medication Sig Dispense Refill   acetaminophen (TYLENOL) 500 MG tablet Take 1,000 mg by mouth 3 (three) times daily as needed.      amLODipine (NORVASC) 2.5 MG tablet Take 1 tablet (2.5 mg total) by mouth daily. 90 tablet 2   Ascorbic Acid (VITAMIN C) 1000 MG tablet Take 1,000 mg by mouth 2 (two) times daily.      aspirin 81 MG tablet Take 81 mg by mouth daily.     carvedilol (COREG) 3.125 MG tablet Take 1 tablet (3.125 mg total) by mouth 2 (two) times daily. 60 tablet 1   Cholecalciferol (VITAMIN D-3) 25 MCG (1000 UT) CAPS Take 10,000 Units by mouth daily.      diclofenac Sodium (VOLTAREN) 1 % GEL Apply topically 4 (four) times daily as needed.      dicyclomine (BENTYL) 10 MG capsule Take 1 capsule (10 mg total) by mouth 3 (three) times daily as needed for spasms. 90 capsule 2   EPINEPHrine (EPIPEN 2-PAK) 0.3 mg/0.3 mL IJ SOAJ injection Use as directed for severe allergic reactions 2 each 1   HYDROcodone-acetaminophen (NORCO/VICODIN) 5-325 MG tablet      lipase/protease/amylase (CREON) 36000  UNITS CPEP capsule Take 2 capsules by mouth with each meal, 1 capsule by mouth with each snack 720 capsule 1   magnesium oxide (MAG-OX) 400 MG tablet Take 200 mg by mouth daily.     Multiple Vitamins-Minerals (CENTRUM SILVER 50+WOMEN PO) Take 1 tablet by mouth daily.     naloxone (NARCAN) 4 MG/0.1ML LIQD nasal spray kit Narcan 4 mg/actuation nasal spray  PLACE 1 SPRAY INTO EACH NOSTRIL FOR SUSPECTED OPIOID OVERDOSE. MAY REPEAT 2 - 3 MINUTES WITH NEW BOTTLE IN OTHER NOSTRIL     olopatadine (PATANOL) 0.1 % ophthalmic solution Place 1 drop into both eyes 2 (two) times daily. 5 mL 11   Omega-3 Fatty Acids (FISH OIL BURP-LESS) 1000 MG CAPS Takes 1000 mg BID 60 capsule 0   omeprazole (PRILOSEC) 40 MG capsule Take 1 capsule (40 mg total) by mouth daily. 90 capsule 4   ondansetron (ZOFRAN) 4 MG tablet Take 1 tablet (4 mg total) by mouth every 8 (eight) hours as needed. 40 tablet 2   tiZANidine (ZANAFLEX) 4 MG tablet Take 1 tablet (4 mg total) by mouth every 6 (six) hours as needed for muscle spasms. 30 tablet 1   vitamin  B-12 (CYANOCOBALAMIN) 1000 MCG tablet Take 1,000 mcg by mouth 2 (two) times daily.      vitamin E 400 UNIT capsule Take 400 Units by mouth daily.     zinc gluconate 50 MG tablet Take 50 mg by mouth daily.     rosuvastatin (CRESTOR) 10 MG tablet Take 1 tablet (10 mg total) by mouth daily. 90 tablet 3   No current facility-administered medications on file prior to visit.    Review of Systems  Constitutional:  Positive for chills. Negative for fever.  Skin:  Positive for color change.  Neurological:  Negative for light-headedness and headaches.       Objective:   Vitals:   01/06/22 0802  BP: (!) 142/82  Pulse: 77  Temp: 98.1 F (36.7 C)  SpO2: 95%   BP Readings from Last 3 Encounters:  01/06/22 (!) 142/82  10/21/21 138/80  10/05/21 (!) 160/88   Wt Readings from Last 3 Encounters:  01/06/22 157 lb (71.2 kg)  10/21/21 154 lb (69.9 kg)  10/05/21 157 lb 3.2 oz (71.3 kg)    Body mass index is 28.26 kg/m.    Physical Exam Constitutional:      General: She is not in acute distress.    Appearance: Normal appearance.  HENT:     Head: Normocephalic and atraumatic.  Skin:    General: Skin is warm and dry.     Comments: Left buttock with approximately 7 cm in diameter area of erythema that is indurated, central area is extremely tender to light touch.  No open wound or discharge.  No obvious fluctuance..  Area is warm  Neurological:     Mental Status: She is alert.             Assessment & Plan:    See Problem List for Assessment and Plan of chronic medical problems.

## 2022-01-09 ENCOUNTER — Encounter: Payer: Self-pay | Admitting: Internal Medicine

## 2022-01-09 NOTE — Progress Notes (Signed)
Subjective:    Patient ID: [name], female    DOB: 10-02-1945, 76 y.o.   MRN: [id number]      HPI [name] is here for  Chief Complaint  Patient presents with   Abscess    Left buttock abscess - the pain in her buttock region is less, but it is still tender - she can sit on it now, whereas last week she is not able to.  She has been applying heat.  The area started to drain Saturday.  She thinks it is a yellow discharge.  The pharmacy did not have the Percocet so she has just been taking over-the-counter medication which does not always help and help with the pain.  She is taking the antibiotic as prescribed.  Referred to surgery.   Ct ordered - pending.     Medications and allergies reviewed with patient and updated if appropriate.  Current Outpatient Medications on File Prior to Visit  Medication Sig Dispense Refill   acetaminophen (TYLENOL) 500 MG tablet Take 1,000 mg by mouth 3 (three) times daily as needed.      amLODipine (NORVASC) 2.5 MG tablet Take 1 tablet (2.5 mg total) by mouth daily. 90 tablet 2   Ascorbic Acid (VITAMIN C) 1000 MG tablet Take 1,000 mg by mouth 2 (two) times daily.      aspirin 81 MG tablet Take 81 mg by mouth daily.     carvedilol (COREG) 3.125 MG tablet Take 1 tablet (3.125 mg total) by mouth 2 (two) times daily. 60 tablet 1   Cholecalciferol (VITAMIN D-3) 25 MCG (1000 UT) CAPS Take 10,000 Units by mouth daily.      diclofenac Sodium (VOLTAREN) 1 % GEL Apply topically 4 (four) times daily as needed.      dicyclomine (BENTYL) 10 MG capsule Take 1 capsule (10 mg total) by mouth 3 (three) times daily as needed for spasms. 90 capsule 2   doxycycline (VIBRA-TABS) 100 MG tablet Take 1 tablet (100 mg total) by mouth 2 (two) times daily for 14 days. 28 tablet 0   EPINEPHrine (EPIPEN 2-PAK) 0.3 mg/0.3 mL IJ SOAJ injection Use as directed for severe allergic reactions 2 each 1   lipase/protease/amylase (CREON) 36000 UNITS CPEP capsule Take 2 capsules by  mouth with each meal, 1 capsule by mouth with each snack 720 capsule 1   magnesium oxide (MAG-OX) 400 MG tablet Take 200 mg by mouth daily.     Multiple Vitamins-Minerals (CENTRUM SILVER 50+WOMEN PO) Take 1 tablet by mouth daily.     naloxone (NARCAN) 4 MG/0.1ML LIQD nasal spray kit Narcan 4 mg/actuation nasal spray  PLACE 1 SPRAY INTO EACH NOSTRIL FOR SUSPECTED OPIOID OVERDOSE. MAY REPEAT 2 - 3 MINUTES WITH NEW BOTTLE IN OTHER NOSTRIL     olopatadine (PATANOL) 0.1 % ophthalmic solution Place 1 drop into both eyes 2 (two) times daily. 5 mL 11   Omega-3 Fatty Acids (FISH OIL BURP-LESS) 1000 MG CAPS Takes 1000 mg BID 60 capsule 0   omeprazole (PRILOSEC) 40 MG capsule Take 1 capsule (40 mg total) by mouth daily. 90 capsule 4   ondansetron (ZOFRAN) 4 MG tablet Take 1 tablet (4 mg total) by mouth every 8 (eight) hours as needed. 30 tablet 0   oxyCODONE-acetaminophen (PERCOCET/ROXICET) 5-325 MG tablet Take 1 tablet by mouth every 4 (four) hours as needed for up to 5 days for severe pain. 30 tablet 0   tiZANidine (ZANAFLEX) 4 MG tablet Take 1 tablet (4 mg  total) by mouth every 6 (six) hours as needed for muscle spasms. 30 tablet 1   vitamin B-12 (CYANOCOBALAMIN) 1000 MCG tablet Take 1,000 mcg by mouth 2 (two) times daily.      vitamin E 400 UNIT capsule Take 400 Units by mouth daily.     zinc gluconate 50 MG tablet Take 50 mg by mouth daily.     rosuvastatin (CRESTOR) 10 MG tablet Take 1 tablet (10 mg total) by mouth daily. 90 tablet 3   No current facility-administered medications on file prior to visit.    Review of Systems  Constitutional:  Positive for chills. Negative for fever.       Objective:   Vitals:   01/10/22 0806  BP: 132/80  Pulse: 70  Temp: 97.9 F (36.6 C)  SpO2: 97%   BP Readings from Last 3 Encounters:  01/10/22 132/80  01/06/22 138/88  10/21/21 138/80   Wt Readings from Last 3 Encounters:  01/06/22 157 lb (71.2 kg)  10/21/21 154 lb (69.9 kg)  10/05/21 157 lb 3.2  oz (71.3 kg)   Body mass index is 28.26 kg/m.    Physical Exam Constitutional:      General: She is not in acute distress.    Appearance: Normal appearance.  HENT:     Head: Normocephalic and atraumatic.  Skin:    General: Skin is warm and dry.     Comments: Left buttock area with erythema improved - smaller and lighter-approximately 4 cm x 6 cm.  Central opening with small amount of blood with surrounding induration.  No area of fluctuance.  Area is tender, but much less than last week  Neurological:     Mental Status: She is alert.            Assessment & Plan:    See Problem List for Assessment and Plan of chronic medical problems.

## 2022-01-09 NOTE — Patient Instructions (Addendum)
      Medications changes include :  vicodan for pain.     Your prescription(s) have been sent to your pharmacy.     Return if symptoms worsen or fail to improve.

## 2022-01-10 ENCOUNTER — Ambulatory Visit (INDEPENDENT_AMBULATORY_CARE_PROVIDER_SITE_OTHER): Payer: Medicare Other | Admitting: Family Medicine

## 2022-01-10 ENCOUNTER — Encounter: Payer: Self-pay | Admitting: Family Medicine

## 2022-01-10 ENCOUNTER — Ambulatory Visit: Payer: Self-pay

## 2022-01-10 ENCOUNTER — Ambulatory Visit (INDEPENDENT_AMBULATORY_CARE_PROVIDER_SITE_OTHER): Payer: Medicare Other | Admitting: Internal Medicine

## 2022-01-10 VITALS — BP 132/80 | HR 70 | Ht 62.5 in

## 2022-01-10 VITALS — BP 132/80 | HR 70 | Temp 97.9°F | Ht 62.5 in

## 2022-01-10 DIAGNOSIS — G8929 Other chronic pain: Secondary | ICD-10-CM | POA: Diagnosis not present

## 2022-01-10 DIAGNOSIS — I10 Essential (primary) hypertension: Secondary | ICD-10-CM | POA: Diagnosis not present

## 2022-01-10 DIAGNOSIS — L0231 Cutaneous abscess of buttock: Secondary | ICD-10-CM | POA: Diagnosis not present

## 2022-01-10 DIAGNOSIS — M25571 Pain in right ankle and joints of right foot: Secondary | ICD-10-CM | POA: Diagnosis not present

## 2022-01-10 DIAGNOSIS — M25572 Pain in left ankle and joints of left foot: Secondary | ICD-10-CM

## 2022-01-10 DIAGNOSIS — M24472 Recurrent dislocation, left ankle: Secondary | ICD-10-CM | POA: Diagnosis not present

## 2022-01-10 DIAGNOSIS — M25471 Effusion, right ankle: Secondary | ICD-10-CM

## 2022-01-10 DIAGNOSIS — I2510 Atherosclerotic heart disease of native coronary artery without angina pectoris: Secondary | ICD-10-CM

## 2022-01-10 DIAGNOSIS — M79672 Pain in left foot: Secondary | ICD-10-CM

## 2022-01-10 DIAGNOSIS — E119 Type 2 diabetes mellitus without complications: Secondary | ICD-10-CM

## 2022-01-10 MED ORDER — HYDROCODONE-ACETAMINOPHEN 5-325 MG PO TABS: 1.0000 | ORAL_TABLET | Freq: Four times a day (QID) | ORAL | 0 refills | Status: DC | PRN

## 2022-01-10 MED ORDER — DIAZEPAM 5 MG PO TABS: ORAL_TABLET | ORAL | 0 refills | Status: DC

## 2022-01-10 NOTE — Assessment & Plan Note (Signed)
Chronic problem with exacerbation.  Now having some instability of the ankle.  Patient has had this on and off and I am concerned for potential OCD with underlying arthritic changes.  I think at this point with patient having even difficulty with ambulation that further evaluation should be done.  Past medical history is significant for osteoporosis which also includes the possibility of occult fractures as well as patient's ankylosing spondylitis.  We will get MRI of actually both ankles with patient having difficulty with the left side previously as well but the right side does have the effusion today.  Follow-up with me after imaging to discuss further treatment options.  Total time reviewing patient's chart including previous notes from other providers, x-rays and seeing patient 36 minutes

## 2022-01-10 NOTE — Assessment & Plan Note (Addendum)
Subacute  - started about 9 days ago S/p ceftriaxone 2 gm IM on 6/9 On doxycycline bid x 14 days - today is day 4 Referral to surgery ordered last week Ct scan ordered to evaluate area - this is her second episode - ? Cyst or deeper lesion causing recurrence Pharmacy did not have Percocet 5-325 Q 4 hrs prn-we will send Vicodin 5-325 mg 1 tab every 6 hours as needed for moderate-severe pain Continue applying heat and encouraged the area to drain

## 2022-01-10 NOTE — Patient Instructions (Signed)
MRI B ankles [phone] Ice and elevate until we know more

## 2022-01-10 NOTE — Assessment & Plan Note (Signed)
Chronic Lab Results  Component Value Date   HGBA1C 6.3 06/03/2021   Sugars have been controlled with lifestyle

## 2022-01-10 NOTE — Progress Notes (Signed)
[facility] [place] Phone: [phone] Subjective:   [name], am serving as a scribe for Dr. Hulan Saas.   I'm seeing this patient by the request  of:  Binnie Rail, MD  CC: Right foot pain and left foot pain  NKN:[id number]  [name] is a 76 y.o. female coming in with complaint of R foot pain. Last seen on 04/07/2021 for L foot issues. Patient states that she started having "twinges" in foot yesterday. Wore post op shoe yesterday. Woke up last night with sharp pain. Pain over lateral malleolus. Insidious onset.  Patient states unfortunately now starting to have more pain in the left side again.  We have seen her for this as well. Patient states that it is starting to affect daily activities.  Unable to walk with regular shoes.  We will put herself in a cam walker which has been a little more helpful.  States that did wake her up out of sleep last night secondary to the pain and swelling.    Past Medical History:  Diagnosis Date   Allergy    seasonal   Ankylosing spondylitis (HCC)    Anxiety    Arthritis    Bell palsy    CAD (coronary artery disease)    a. Coronary CT 10/2016 showed calcium score in the 59th perecntile, moderate CAD in RCA/PDA and narrowing of the mid to distal LAD, with negative FFR.   CTS (carpal tunnel syndrome)    Cystitis    Diabetes mellitus without complication ([place])    Diverticulitis    Diverticulosis of colon    Dry eye syndrome    Exocrine pancreatic insufficiency    Frozen shoulder    GERD (gastroesophageal reflux disease)    HLA B27 (HLA B27 positive)    Hx of colonic polyps    Hyperlipidemia    NMR 2005   Hypertension    Hypothyroidism    Incontinence    Internal hemorrhoids    Interstitial cystitis    Kidney stone    Menopause    Mild aortic insufficiency    Mild ascending aorta dilatation (HCC)    Osteoarthritis    Osteopenia    [facility]  [address]   Pre-diabetes    PSVT (paroxysmal supraventricular tachycardia) ([place])    S/P total hysterectomy and bilateral salpingo-oophorectomy 04/12/2014   Sinusitis    Sleep apnea    c-pap   Subjective visual disturbance of both eyes 12/30/2013   Tubular adenoma of colon    Vitamin D deficiency    Xerostomia    and Xeroophthalmia   Past Surgical History:  Procedure Laterality Date   BLADDER SURGERY     for incontinence   CARPAL TUNNEL RELEASE Left 12/22/2014   Procedure: LEFT CARPAL TUNNEL RELEASE;  Surgeon: Daryll Brod, MD;  Location: [facility];  Service: Orthopedics;  Laterality: Left;   CARPAL TUNNEL RELEASE Right 02/18/2019   Procedure: RIGHT CARPAL TUNNEL RELEASE;  Surgeon: Daryll Brod, MD;  Location: [facility];  Service: Orthopedics;  Laterality: Right;   COLONOSCOPY W/ POLYPECTOMY     adenomatous poyp 04-2008   colonscopy     tics 10-2002   CYSTOSCOPY WITH RETROGRADE PYELOGRAM, URETEROSCOPY AND STENT PLACEMENT Left 09/01/2013   Procedure: CYSTOSCOPY WITH RETROGRADE PYELOGRAM, AND LEFT STENT PLACEMENT;  Surgeon: Molli Hazard, MD;  Location: [facility];  Service: Urology;  Laterality: Left;  elevated LFTs     due to Elmiron   FINGER SURGERY Left 2004   2nd finger   G 1 P 1     interstitial cystitis     with clot in catheter   PARTIAL HYSTERECTOMY     with one ovary left   SHOULDER ARTHROSCOPY Left    TOTAL ABDOMINAL HYSTERECTOMY W/ BILATERAL SALPINGOOPHORECTOMY     dysfunctional menses   Social History   Socioeconomic History   Marital status: Married    Spouse name: Not on file   Number of children: 1   Years of education: BS   Highest education level: Not on file  Occupational History   Occupation: retired   Occupation: Pharmacist, hospital    Comment: Kindergarten  Tobacco Use   Smoking status: Never   Smokeless tobacco: Never  Vaping Use   Vaping Use: Never used  Substance and Sexual Activity   Alcohol use: Yes    Comment: social    Drug use: No   Sexual activity: Not Currently  Other Topics Concern   Not on file  Social History Narrative   Lives at home with husband.   No daily use of caffeine.   Right-handed.         Social Determinants of Health   Financial Resource Strain: Low Risk  (03/16/2021)   Overall Financial Resource Strain (CARDIA)    Difficulty of Paying Living Expenses: Not hard at all  Food Insecurity: No Food Insecurity (03/16/2021)   Hunger Vital Sign    Worried About Running Out of Food in the Last Year: Never true    Ran Out of Food in the Last Year: Never true  Transportation Needs: No Transportation Needs (03/16/2021)   PRAPARE - Hydrologist (Medical): No    Lack of Transportation (Non-Medical): No  Physical Activity: Sufficiently Active (03/16/2021)   Exercise Vital Sign    Days of Exercise per Week: 5 days    Minutes of Exercise per Session: 30 min  Stress: No Stress Concern Present (03/16/2021)   [place]    Feeling of Stress : Not at all  Social Connections: [place] (03/16/2021)   Social Connection and Isolation Panel [NHANES]    Frequency of Communication with Friends and Family: More than three times a week    Frequency of Social Gatherings with Friends and Family: Once a week    Attends Religious Services: More than 4 times per year    Active Member of [employer] or Organizations: Yes    Attends Music therapist: More than 4 times per year    Marital Status: Married   Allergies  Allergen Reactions   Bactrim [Sulfamethoxazole-Trimethoprim] Other (See Comments)   Nitroglycerin Anaphylaxis    Hypotensive after NTG administration in ER during chest pain evaluation Pt states she almost died,  Pt states in a procedure done in 10/2016 she had no reaction to this medication, so not sure if it is an actual allergy.    Pentosan Polysulfate Other (See Comments)    Elevated  liver count Elevated liver enzymes Elevated LFTs.......Marland Kitchen elmiron    Pentosan Polysulfate Sodium     Elevated LFTs.......Marland Kitchen elmiron    Promethazine Hcl Other (See Comments)    "jittery on the inside"   Sulfamethoxazole-Trimethoprim Nausea Only    Unknown reaction per pt, Pt thinks she may have felt sick to her stomach.    Losartan Other (See Comments)  Pt states caused insomnia, tingling sensation in legs, lightheaded, stomach pain   Promethazine Hcl    Lisinopril Cough   Family History  Problem Relation Age of Onset   Coronary artery disease Mother    Hypertension Mother    Asthma Mother    Dementia Mother    Stroke Mother        mini cva   Diabetes Father    Cirrhosis Father        non alcoholic   Colon cancer Sister 24   Allergic rhinitis Sister    Allergic rhinitis Sister    Food Allergy Sister    Renal cancer Paternal Aunt        x 2   Coronary artery disease Maternal Grandfather    Breast cancer Maternal Aunt    Diabetes Paternal Aunt    Diabetes Paternal Grandmother    Esophageal cancer Neg Hx    Stomach cancer Neg Hx      Current Outpatient Medications (Cardiovascular):    amLODipine (NORVASC) 2.5 MG tablet, Take 1 tablet (2.5 mg total) by mouth daily.   carvedilol (COREG) 3.125 MG tablet, Take 1 tablet (3.125 mg total) by mouth 2 (two) times daily.   EPINEPHrine (EPIPEN 2-PAK) 0.3 mg/0.3 mL IJ SOAJ injection, Use as directed for severe allergic reactions   rosuvastatin (CRESTOR) 10 MG tablet, Take 1 tablet (10 mg total) by mouth daily.   Current Outpatient Medications (Analgesics):    acetaminophen (TYLENOL) 500 MG tablet, Take 1,000 mg by mouth 3 (three) times daily as needed.    aspirin 81 MG tablet, Take 81 mg by mouth daily.   HYDROcodone-acetaminophen (NORCO/VICODIN) 5-325 MG tablet, Take 1 tablet by mouth every 6 (six) hours as needed for moderate pain.  Current Outpatient Medications (Hematological):    vitamin B-12 (CYANOCOBALAMIN) 1000 MCG  tablet, Take 1,000 mcg by mouth 2 (two) times daily.   Current Outpatient Medications (Other):    diazepam (VALIUM) 5 MG tablet, One tab by mouth, 2 hours before procedure.   Ascorbic Acid (VITAMIN C) 1000 MG tablet, Take 1,000 mg by mouth 2 (two) times daily.    Cholecalciferol (VITAMIN D-3) 25 MCG (1000 UT) CAPS, Take 10,000 Units by mouth daily.    diclofenac Sodium (VOLTAREN) 1 % GEL, Apply topically 4 (four) times daily as needed.    dicyclomine (BENTYL) 10 MG capsule, Take 1 capsule (10 mg total) by mouth 3 (three) times daily as needed for spasms.   doxycycline (VIBRA-TABS) 100 MG tablet, Take 1 tablet (100 mg total) by mouth 2 (two) times daily for 14 days.   lipase/protease/amylase (CREON) 36000 UNITS CPEP capsule, Take 2 capsules by mouth with each meal, 1 capsule by mouth with each snack   magnesium oxide (MAG-OX) 400 MG tablet, Take 200 mg by mouth daily.   Multiple Vitamins-Minerals (CENTRUM SILVER 50+WOMEN PO), Take 1 tablet by mouth daily.   naloxone (NARCAN) 4 MG/0.1ML LIQD nasal spray kit, Narcan 4 mg/actuation nasal spray  PLACE 1 SPRAY INTO EACH NOSTRIL FOR SUSPECTED OPIOID OVERDOSE. MAY REPEAT 2 - 3 MINUTES WITH NEW BOTTLE IN OTHER NOSTRIL   olopatadine (PATANOL) 0.1 % ophthalmic solution, Place 1 drop into both eyes 2 (two) times daily.   Omega-3 Fatty Acids (FISH OIL BURP-LESS) 1000 MG CAPS, Takes 1000 mg BID   omeprazole (PRILOSEC) 40 MG capsule, Take 1 capsule (40 mg total) by mouth daily.   ondansetron (ZOFRAN) 4 MG tablet, Take 1 tablet (4 mg total) by mouth every 8 (eight)  hours as needed.   tiZANidine (ZANAFLEX) 4 MG tablet, Take 1 tablet (4 mg total) by mouth every 6 (six) hours as needed for muscle spasms.   vitamin E 400 UNIT capsule, Take 400 Units by mouth daily.   zinc gluconate 50 MG tablet, Take 50 mg by mouth daily.   Reviewed prior external information including notes and imaging from  primary care provider did review x-rays of patient's ankle from  February 2023 showing the patient did have some mild arthritic changes and I do think there is a questionable As well as notes that were available from care everywhere and other healthcare systems.  Past medical history, social, surgical and family history all reviewed in electronic medical record.  No pertanent information unless stated regarding to the chief complaint.   Review of Systems:  No headache, visual changes, nausea, vomiting, diarrhea, constipation, dizziness, abdominal pain, skin rash, fevers, chills, night sweats, weight loss, swollen lymph nodes, body aches,chest pain, shortness of breath, mood changes. POSITIVE muscle aches, joint swelling  Objective  Blood pressure 132/80, pulse 70, height 5' 2.5" (1.588 m), SpO2 97 %.   General: No apparent distress alert and oriented x3 mood and affect normal, dressed appropriately.  HEENT: Pupils equal, extraocular movements intact  Respiratory: Patient's speak in full sentences and does not appear short of breath  Cardiovascular: Trace effusion Severely antalgic gait noted.  Having difficulty even weightbearing on the right ankle Bilateral ankles do have effusion but right greater than left.  Warm to palpation on the right side.  No skin breakdown.  Patient has limited range of motion secondary to involuntary and voluntary guarding noted.  Neurovascular intact distally well.  Left ankle does have severe tenderness over the lateral peroneal tendons.   Limited muscular skeletal ultrasound was performed and interpreted by Hulan Saas, M  Limited ultrasound of patient's right ankle shows a cortical irregular large joint effusion of the anterior ankle mortise.  Hypoechoic changes with increasing in Doppler flow that does seem to have acute on chronic changes.  No significant calcific changes noted in the area and either a OCD of the anterior talar area or the possibility of an erosive change in this area.  Difficult to assess on  ultrasound. Impression: Acute on chronic joint effusion with questionable OCD or abnormality of the talar dome   Impression and Recommendations:

## 2022-01-10 NOTE — Assessment & Plan Note (Signed)
Has had difficulty with left foot proximal knee pain previously.  With patient having difficulty on this of the right side now compensating concerned the patient could potentially start to develop an insulin.  Also will give [place] a nice contrasting picture secondary to patient also having increasing spondylolisthesis that likely will have some calcific changes probably a anatomical variant. Chronic peroneal subluxation on the left side to debridement continue to monitor

## 2022-01-10 NOTE — Telephone Encounter (Signed)
Patient seen in office today and she was told this was regarding to her AWV and that she was due.

## 2022-01-10 NOTE — Assessment & Plan Note (Signed)
Chronic Blood pressure well controlled Continue amlodipine 2.5 mg daily, Coreg 3.125 mg twice daily

## 2022-01-11 ENCOUNTER — Ambulatory Visit
Admission: RE | Admit: 2022-01-11 | Discharge: 2022-01-11 | Disposition: A | Discharge status: Home / Self Care | Payer: Medicare Other | Source: Ambulatory Visit | Attending: Family Medicine | Admitting: Family Medicine

## 2022-01-11 ENCOUNTER — Ambulatory Visit (INDEPENDENT_AMBULATORY_CARE_PROVIDER_SITE_OTHER): Payer: Medicare Other

## 2022-01-11 ENCOUNTER — Ambulatory Visit (INDEPENDENT_AMBULATORY_CARE_PROVIDER_SITE_OTHER): Payer: Medicare Other | Admitting: Family Medicine

## 2022-01-11 VITALS — BP 110/68 | HR 60 | Ht 62.5 in

## 2022-01-11 DIAGNOSIS — M25571 Pain in right ankle and joints of right foot: Secondary | ICD-10-CM | POA: Diagnosis not present

## 2022-01-11 DIAGNOSIS — I2510 Atherosclerotic heart disease of native coronary artery without angina pectoris: Secondary | ICD-10-CM

## 2022-01-11 DIAGNOSIS — M25471 Effusion, right ankle: Secondary | ICD-10-CM

## 2022-01-11 MED ORDER — PREDNISONE 50 MG PO TABS: ORAL_TABLET | ORAL | 0 refills | Status: DC

## 2022-01-11 NOTE — Assessment & Plan Note (Signed)
No worsening symptoms.  Patient is no longer to bear weight on it at the moment.  Patient likely has been able to get an for the MRI today.  In addition at this time we will get a Doppler to rule out a blood clot because of the severity of the pain.  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  Prednisone given as well as a wait-and-see with patient getting the MRI today. Total time with patient and daughter 81 minutes

## 2022-01-11 NOTE — Patient Instructions (Addendum)
Xray again today  Doppler R ankle-They will call you [facility]  ([address] [employer] in [facility]) [address], [address] [place], [place] [address] [phone]  Keep calling [facility] Prednisone daily for 5 days We will be in touch but if symptoms worsen you need to go into ER

## 2022-01-11 NOTE — Progress Notes (Signed)
[name] Phone: [phone] Subjective:   [name], am serving as a scribe for Dr. Hulan Saas.   I'm seeing this patient by the request  of:  Binnie Rail, MD  CC: Right ankle pain    WER:[id number]  [name] is a 76 y.o. female coming in with complaint of ankle pain. Patient states that R foot pain is worse today. Unable to put pressure on foot. Patient was able to walk with boot yesterday but progressively has been getting worse. Took IBU 800 today but hydrocodone did not work last night. 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   Patient also c/o cyst on L glute. Had large area of inflammation of approximately 4 inch in diameter. Patient used heat to drain cyst and it has not gone away completely. Wondering if she has systemic infection.      Past Medical History:  Diagnosis Date   Allergy    seasonal   Ankylosing spondylitis (HCC)    Anxiety    Arthritis    Bell palsy    CAD (coronary artery disease)    a. Coronary CT 10/2016 showed calcium score in the 59th perecntile, moderate CAD in RCA/PDA and narrowing of the mid to distal LAD, with negative FFR.   CTS (carpal tunnel syndrome)    Cystitis    Diabetes mellitus without complication ([place])    Diverticulitis    Diverticulosis of colon    Dry eye syndrome    Exocrine pancreatic insufficiency    Frozen shoulder    GERD (gastroesophageal reflux disease)    HLA B27 (HLA B27 positive)    Hx of colonic polyps    Hyperlipidemia    NMR 2005   Hypertension    Hypothyroidism    Incontinence    Internal hemorrhoids    Interstitial cystitis    Kidney stone    Menopause    Mild aortic insufficiency    Mild ascending aorta dilatation (HCC)    Osteoarthritis    Osteopenia    [facility] [address]   Pre-diabetes    PSVT (paroxysmal supraventricular  tachycardia) ([place])    S/P total hysterectomy and bilateral salpingo-oophorectomy 04/12/2014   Sinusitis    Sleep apnea    c-pap   Subjective visual disturbance of both eyes 12/30/2013   Tubular adenoma of colon    Vitamin D deficiency    Xerostomia    and Xeroophthalmia   Past Surgical History:  Procedure Laterality Date   BLADDER SURGERY     for incontinence   CARPAL TUNNEL RELEASE Left 12/22/2014   Procedure: LEFT CARPAL TUNNEL RELEASE;  Surgeon: Daryll Brod, MD;  Location: [place];  Service: Orthopedics;  Laterality: Left;   CARPAL TUNNEL RELEASE Right 02/18/2019   Procedure: RIGHT CARPAL TUNNEL RELEASE;  Surgeon: Daryll Brod, MD;  Location: [facility];  Service: Orthopedics;  Laterality: Right;   COLONOSCOPY W/ POLYPECTOMY     adenomatous poyp 04-2008   colonscopy     tics 10-2002   CYSTOSCOPY WITH RETROGRADE PYELOGRAM, URETEROSCOPY AND STENT PLACEMENT Left 09/01/2013   Procedure: CYSTOSCOPY WITH RETROGRADE PYELOGRAM, AND LEFT STENT PLACEMENT;  Surgeon: Molli Hazard, MD;  Location: [facility];  Service: Urology;  Laterality: Left;   elevated LFTs     due to Elmiron   FINGER SURGERY Left  2004   2nd finger   G 1 P 1     interstitial cystitis     with clot in catheter   PARTIAL HYSTERECTOMY     with one ovary left   SHOULDER ARTHROSCOPY Left    TOTAL ABDOMINAL HYSTERECTOMY W/ BILATERAL SALPINGOOPHORECTOMY     dysfunctional menses   Social History   Socioeconomic History   Marital status: Married    Spouse name: Not on file   Number of children: 1   Years of education: BS   Highest education level: Not on file  Occupational History   Occupation: retired   Occupation: Pharmacist, hospital    Comment: Kindergarten  Tobacco Use   Smoking status: Never   Smokeless tobacco: Never  Vaping Use   Vaping Use: Never used  Substance and Sexual Activity   Alcohol use: Yes    Comment: social   Drug use: No   Sexual activity: Not Currently  Other Topics  Concern   Not on file  Social History Narrative   Lives at home with husband.   No daily use of caffeine.   Right-handed.         Social Determinants of Health   Financial Resource Strain: Low Risk  (03/16/2021)   Overall Financial Resource Strain (CARDIA)    Difficulty of Paying Living Expenses: Not hard at all  Food Insecurity: No Food Insecurity (03/16/2021)   Hunger Vital Sign    Worried About Running Out of Food in the Last Year: Never true    Ran Out of Food in the Last Year: Never true  Transportation Needs: No Transportation Needs (03/16/2021)   PRAPARE - Hydrologist (Medical): No    Lack of Transportation (Non-Medical): No  Physical Activity: Sufficiently Active (03/16/2021)   Exercise Vital Sign    Days of Exercise per Week: 5 days    Minutes of Exercise per Session: 30 min  Stress: No Stress Concern Present (03/16/2021)   [place]    Feeling of Stress : Not at all  Social Connections: [place] (03/16/2021)   Social Connection and Isolation Panel [NHANES]    Frequency of Communication with Friends and Family: More than three times a week    Frequency of Social Gatherings with Friends and Family: Once a week    Attends Religious Services: More than 4 times per year    Active Member of [employer] or Organizations: Yes    Attends Music therapist: More than 4 times per year    Marital Status: Married   Allergies  Allergen Reactions   Bactrim [Sulfamethoxazole-Trimethoprim] Other (See Comments)   Nitroglycerin Anaphylaxis    Hypotensive after NTG administration in ER during chest pain evaluation Pt states she almost died,  Pt states in a procedure done in 10/2016 she had no reaction to this medication, so not sure if it is an actual allergy.    Pentosan Polysulfate Other (See Comments)    Elevated liver count Elevated liver enzymes Elevated LFTs.......Marland Kitchen  elmiron    Pentosan Polysulfate Sodium     Elevated LFTs.......Marland Kitchen elmiron    Promethazine Hcl Other (See Comments)    "jittery on the inside"   Sulfamethoxazole-Trimethoprim Nausea Only    Unknown reaction per pt, Pt thinks she may have felt sick to her stomach.    Losartan Other (See Comments)    Pt states caused insomnia, tingling sensation in legs, lightheaded, stomach pain  Promethazine Hcl    Lisinopril Cough   Family History  Problem Relation Age of Onset   Coronary artery disease Mother    Hypertension Mother    Asthma Mother    Dementia Mother    Stroke Mother        mini cva   Diabetes Father    Cirrhosis Father        non alcoholic   Colon cancer Sister 51   Allergic rhinitis Sister    Allergic rhinitis Sister    Food Allergy Sister    Renal cancer Paternal Aunt        x 2   Coronary artery disease Maternal Grandfather    Breast cancer Maternal Aunt    Diabetes Paternal Aunt    Diabetes Paternal Grandmother    Esophageal cancer Neg Hx    Stomach cancer Neg Hx     Current Outpatient Medications (Endocrine & Metabolic):    predniSONE (DELTASONE) 50 MG tablet, Take one tablet daily for the next 5 days.  Current Outpatient Medications (Cardiovascular):    amLODipine (NORVASC) 2.5 MG tablet, Take 1 tablet (2.5 mg total) by mouth daily.   carvedilol (COREG) 3.125 MG tablet, Take 1 tablet (3.125 mg total) by mouth 2 (two) times daily.   EPINEPHrine (EPIPEN 2-PAK) 0.3 mg/0.3 mL IJ SOAJ injection, Use as directed for severe allergic reactions   rosuvastatin (CRESTOR) 10 MG tablet, Take 1 tablet (10 mg total) by mouth daily.   Current Outpatient Medications (Analgesics):    acetaminophen (TYLENOL) 500 MG tablet, Take 1,000 mg by mouth 3 (three) times daily as needed.    aspirin 81 MG tablet, Take 81 mg by mouth daily.   HYDROcodone-acetaminophen (NORCO/VICODIN) 5-325 MG tablet, Take 1 tablet by mouth every 6 (six) hours as needed for moderate pain.  Current  Outpatient Medications (Hematological):    vitamin B-12 (CYANOCOBALAMIN) 1000 MCG tablet, Take 1,000 mcg by mouth 2 (two) times daily.   Current Outpatient Medications (Other):    Ascorbic Acid (VITAMIN C) 1000 MG tablet, Take 1,000 mg by mouth 2 (two) times daily.    Cholecalciferol (VITAMIN D-3) 25 MCG (1000 UT) CAPS, Take 10,000 Units by mouth daily.    diazepam (VALIUM) 5 MG tablet, One tab by mouth, 2 hours before procedure.   diclofenac Sodium (VOLTAREN) 1 % GEL, Apply topically 4 (four) times daily as needed.    dicyclomine (BENTYL) 10 MG capsule, Take 1 capsule (10 mg total) by mouth 3 (three) times daily as needed for spasms.   doxycycline (VIBRA-TABS) 100 MG tablet, Take 1 tablet (100 mg total) by mouth 2 (two) times daily for 14 days.   lipase/protease/amylase (CREON) 36000 UNITS CPEP capsule, Take 2 capsules by mouth with each meal, 1 capsule by mouth with each snack   magnesium oxide (MAG-OX) 400 MG tablet, Take 200 mg by mouth daily.   Multiple Vitamins-Minerals (CENTRUM SILVER 50+WOMEN PO), Take 1 tablet by mouth daily.   naloxone (NARCAN) 4 MG/0.1ML LIQD nasal spray kit, Narcan 4 mg/actuation nasal spray  PLACE 1 SPRAY INTO EACH NOSTRIL FOR SUSPECTED OPIOID OVERDOSE. MAY REPEAT 2 - 3 MINUTES WITH NEW BOTTLE IN OTHER NOSTRIL   olopatadine (PATANOL) 0.1 % ophthalmic solution, Place 1 drop into both eyes 2 (two) times daily.   Omega-3 Fatty Acids (FISH OIL BURP-LESS) 1000 MG CAPS, Takes 1000 mg BID   omeprazole (PRILOSEC) 40 MG capsule, Take 1 capsule (40 mg total) by mouth daily.   ondansetron (ZOFRAN) 4 MG tablet, Take  1 tablet (4 mg total) by mouth every 8 (eight) hours as needed.   tiZANidine (ZANAFLEX) 4 MG tablet, Take 1 tablet (4 mg total) by mouth every 6 (six) hours as needed for muscle spasms.   vitamin E 400 UNIT capsule, Take 400 Units by mouth daily.   zinc gluconate 50 MG tablet, Take 50 mg by mouth daily.   Reviewed prior external information including notes and  imaging from  primary care provider As well as notes that were available from care everywhere and other healthcare systems.  Past medical history, social, surgical and family history all reviewed in electronic medical record.  No pertanent information unless stated regarding to the chief complaint.   Review of Systems:  No headache, visual changes, nausea, vomiting, diarrhea, constipation, dizziness, abdominal pain, skin rash, fevers, chills, night sweats, weight loss, swollen lymph nodes, body aches, joint swelling, chest pain, shortness of breath, mood changes. POSITIVE muscle aches  Objective  Blood pressure 110/68, pulse 60, height 5' 2.5" (1.588 m), SpO2 96 %.   General: No apparent distress alert and oriented x3 mood and affect normal, dressed appropriately.  HEENT: Pupils equal, extraocular movements intact  Respiratory: Patient's speak in full sentences and does not appear short of breath  Unable to walk even in the boot with the ankle.  Does have warmness to touch.  No signs of infectious etiology.  Patient does have a larger distally.  Some limited range of motion.  Some more pain in the calf region than last time as well.  Achilles tendon appears to be intact.  Neurovascularly intact distally. ' Limited muscular skeletal ultrasound was performed and interpreted by Hulan Saas, M  Limited ultrasound shows the patient does have a large effusion noted.  Seems worse than he did yesterday.  Patient does have some cortical irregularity of the talar dome noted.  Patient is a lateral malleolus does not appear to be anything significantly remarkable. Impression: Worsening effusion of the right ankle    Impression and Recommendations:

## 2022-01-12 ENCOUNTER — Encounter (HOSPITAL_COMMUNITY): Payer: Medicare Other

## 2022-01-12 ENCOUNTER — Ambulatory Visit (HOSPITAL_COMMUNITY)
Admission: RE | Admit: 2022-01-12 | Discharge: 2022-01-12 | Disposition: A | Discharge status: Home / Self Care | Payer: Medicare Other | Source: Ambulatory Visit | Attending: Internal Medicine | Admitting: Internal Medicine

## 2022-01-12 ENCOUNTER — Ambulatory Visit: Payer: Medicare Other | Admitting: Family Medicine

## 2022-01-12 ENCOUNTER — Ambulatory Visit
Admission: RE | Admit: 2022-01-12 | Discharge: 2022-01-12 | Disposition: A | Discharge status: Home / Self Care | Payer: Medicare Other | Source: Ambulatory Visit | Attending: Family Medicine | Admitting: Family Medicine

## 2022-01-12 DIAGNOSIS — M25571 Pain in right ankle and joints of right foot: Secondary | ICD-10-CM

## 2022-01-12 DIAGNOSIS — R600 Localized edema: Secondary | ICD-10-CM | POA: Diagnosis not present

## 2022-01-12 DIAGNOSIS — G8929 Other chronic pain: Secondary | ICD-10-CM

## 2022-01-18 ENCOUNTER — Other Ambulatory Visit: Payer: Medicare Other

## 2022-01-18 ENCOUNTER — Emergency Department (HOSPITAL_COMMUNITY): Payer: Medicare Other

## 2022-01-18 ENCOUNTER — Emergency Department (HOSPITAL_COMMUNITY)
Admission: EM | Admit: 2022-01-18 | Discharge: 2022-01-19 | Disposition: A | Discharge status: Home / Self Care | Payer: Medicare Other | Attending: Emergency Medicine | Admitting: Emergency Medicine

## 2022-01-18 ENCOUNTER — Encounter (HOSPITAL_COMMUNITY): Payer: Self-pay | Admitting: Pharmacy Technician

## 2022-01-18 ENCOUNTER — Ambulatory Visit (INDEPENDENT_AMBULATORY_CARE_PROVIDER_SITE_OTHER): Payer: Medicare Other | Admitting: Physician Assistant

## 2022-01-18 ENCOUNTER — Encounter: Payer: Self-pay | Admitting: Physician Assistant

## 2022-01-18 ENCOUNTER — Other Ambulatory Visit: Payer: Self-pay

## 2022-01-18 VITALS — BP 138/84 | HR 61 | Ht 62.5 in | Wt 156.0 lb

## 2022-01-18 DIAGNOSIS — I10 Essential (primary) hypertension: Secondary | ICD-10-CM | POA: Diagnosis not present

## 2022-01-18 DIAGNOSIS — E785 Hyperlipidemia, unspecified: Secondary | ICD-10-CM

## 2022-01-18 DIAGNOSIS — I471 Supraventricular tachycardia: Secondary | ICD-10-CM | POA: Diagnosis not present

## 2022-01-18 DIAGNOSIS — R791 Abnormal coagulation profile: Secondary | ICD-10-CM | POA: Diagnosis not present

## 2022-01-18 DIAGNOSIS — R42 Dizziness and giddiness: Secondary | ICD-10-CM | POA: Diagnosis not present

## 2022-01-18 DIAGNOSIS — Z7982 Long term (current) use of aspirin: Secondary | ICD-10-CM | POA: Diagnosis not present

## 2022-01-18 DIAGNOSIS — I77810 Thoracic aortic ectasia: Secondary | ICD-10-CM | POA: Diagnosis not present

## 2022-01-18 DIAGNOSIS — I2510 Atherosclerotic heart disease of native coronary artery without angina pectoris: Secondary | ICD-10-CM

## 2022-01-18 DIAGNOSIS — S0990XA Unspecified injury of head, initial encounter: Secondary | ICD-10-CM | POA: Diagnosis not present

## 2022-01-18 DIAGNOSIS — M25512 Pain in left shoulder: Secondary | ICD-10-CM | POA: Diagnosis not present

## 2022-01-18 DIAGNOSIS — I739 Peripheral vascular disease, unspecified: Secondary | ICD-10-CM | POA: Diagnosis not present

## 2022-01-18 DIAGNOSIS — R410 Disorientation, unspecified: Secondary | ICD-10-CM | POA: Diagnosis not present

## 2022-01-18 DIAGNOSIS — M47812 Spondylosis without myelopathy or radiculopathy, cervical region: Secondary | ICD-10-CM | POA: Diagnosis not present

## 2022-01-18 DIAGNOSIS — I351 Nonrheumatic aortic (valve) insufficiency: Secondary | ICD-10-CM | POA: Diagnosis not present

## 2022-01-18 DIAGNOSIS — Z79899 Other long term (current) drug therapy: Secondary | ICD-10-CM | POA: Diagnosis not present

## 2022-01-18 DIAGNOSIS — R29818 Other symptoms and signs involving the nervous system: Secondary | ICD-10-CM | POA: Diagnosis not present

## 2022-01-18 LAB — CBC
HCT: 36.7 % (ref 36.0–46.0)
Hemoglobin: 12.1 g/dL (ref 12.0–15.0)
MCH: 29.2 pg (ref 26.0–34.0)
MCHC: 33 g/dL (ref 30.0–36.0)
MCV: 88.4 fL (ref 80.0–100.0)
Platelets: 346 10*3/uL (ref 150–400)
RBC: 4.15 MIL/uL (ref 3.87–5.11)
RDW: 12.8 % (ref 11.5–15.5)
WBC: 10.3 10*3/uL (ref 4.0–10.5)
nRBC: 0 % (ref 0.0–0.2)

## 2022-01-18 LAB — AMMONIA: Ammonia: 20 umol/L (ref 9–35)

## 2022-01-18 LAB — COMPREHENSIVE METABOLIC PANEL
ALT: 30 U/L (ref 0–44)
AST: 26 U/L (ref 15–41)
Albumin: 3.6 g/dL (ref 3.5–5.0)
Alkaline Phosphatase: 101 U/L (ref 38–126)
Anion gap: 8 (ref 5–15)
BUN: 18 mg/dL (ref 8–23)
CO2: 27 mmol/L (ref 22–32)
Calcium: 9.4 mg/dL (ref 8.9–10.3)
Chloride: 105 mmol/L (ref 98–111)
Creatinine, Ser: 0.91 mg/dL (ref 0.44–1.00)
GFR, Estimated: >60 mL/min (ref >60)
Glucose, Bld: 101 mg/dL — ABNORMAL HIGH (ref 70–99)
Potassium: 3.7 mmol/L (ref 3.5–5.1)
Sodium: 140 mmol/L (ref 135–145)
Total Bilirubin: 0.5 mg/dL (ref 0.3–1.2)
Total Protein: 7.1 g/dL (ref 6.5–8.1)

## 2022-01-18 LAB — TSH: TSH: 4.15 u[IU]/mL (ref 0.350–4.500)

## 2022-01-18 LAB — PROTIME-INR
INR: 0.9 (ref 0.8–1.2)
Prothrombin Time: 12.4 seconds (ref 11.4–15.2)

## 2022-01-18 LAB — TROPONIN I (HIGH SENSITIVITY)
Troponin I (High Sensitivity): 4 ng/L (ref <18)
Troponin I (High Sensitivity): 5 ng/L (ref <18)

## 2022-01-18 MED ORDER — IOHEXOL 350 MG/ML SOLN
75.0000 mL | Freq: Once | INTRAVENOUS | Status: AC | PRN
  Administered 2022-01-18: 75 mL via INTRAVENOUS

## 2022-01-18 MED ORDER — LORAZEPAM 1 MG PO TABS
1.0000 mg | ORAL_TABLET | Freq: Once | ORAL | Status: AC
  Administered 2022-01-18: 1 mg via ORAL
  Filled 2022-01-18: qty 1

## 2022-01-18 MED ORDER — LACTATED RINGERS IV BOLUS
1000.0000 mL | Freq: Once | INTRAVENOUS | Status: AC
  Administered 2022-01-18: 1000 mL via INTRAVENOUS

## 2022-01-18 NOTE — Patient Instructions (Addendum)
Medication Instructions:  Your physician recommends that you continue on your current medications as directed. Please refer to the Current Medication list given to you today. *If you need a refill on your cardiac medications before your next appointment, please call your pharmacy*   Lab Work: None ordered   Testing/Procedures: Your physician has requested that you have a carotid duplex. This test is an ultrasound of the carotid arteries in your neck. It looks at blood flow through these arteries that supply the brain with blood. Allow one hour for this exam. There are no restrictions or special instructions.   Follow-Up: At [facility], you and your health needs are our priority.  As part of our continuing mission to provide you with exceptional heart care, we have created designated Provider Care Teams.  These Care Teams include your primary Cardiologist (physician) and Advanced Practice Providers (APPs -  Physician Assistants and Nurse Practitioners) who all work together to provide you with the care you need, when you need it.  We recommend signing up for the patient portal called "MyChart".  Sign up information is provided on this After Visit Summary.  MyChart is used to connect with patients for Virtual Visits (Telemedicine).  Patients are able to view lab/test results, encounter notes, upcoming appointments, etc.  Non-urgent messages can be sent to your provider as well.   To learn more about what you can do with MyChart, go to [email or web address].    Your next appointment:   3-4 week(s)  The format for your next appointment:   In Person  Provider:   Melina Copa, PA     Other Instructions During today's visit you noted significant increase in lightheadedness, associated with a sense of difficulty forming thoughts as well as disequilibrium when trying to walk. Your recent heart testing would not suggest a specific cause therefore we have recommended you proceed to the  emergency from for further neurologic workup to ensure no recent stroke.  Important Information About Sugar

## 2022-01-18 NOTE — Progress Notes (Signed)
Cardiology Office Note    Date:  01/18/2022   ID:  [name], DOB July 09, 1946, MRN [id number]  PCP:  [name] Rail, MD  Cardiologist:  [name] Bergeron, MD  Electrophysiologist:  None   Chief Complaint: f/u lightheadedness   History of Present Illness:   [name] is a 76 y.o. female with history of moderate CAD by coronary CT, aortic atherosclerosis, mild-moderate AI by echo 06/2021, mildly dilated ascending aorta, HTN, PSVT, ankylosing spondylitis/HLA B27 positive, anxiety, osteoarthritis, Bell's palsy, diverticulosis, pancreatic insufficiency, HLD, HTN, interstitial cystitis, OA, pre-DM, OSA on CPAP (followed at [facility]), mild anemia who presents for f/u cardiology evaluation. [name] (who has a complex medical history) and daughter [name] are also followed by our clinic.   She previously followed with Dr. Meda [name] for history of atypical sharp chest pain and nonobstructive CAD. Last ischemic assessment was by coronary CTA in 07/2020 with moderate stenosis in the prox and distal RCA, mild stenosis in the prox LAD with negative FFR, no abnormalities mentioned in aorta. Event monitor showed NSR with short runs of SVT, rare PVCs, PACs, average HR 75bpm, range 42-176 (low reading was during sleeping hours). Palpitations improved without intervention. She previously followed with the lipid clinic in the past for cholesterol management but ultimately had been generally tolerating rosuvastatin. She has a history of mylagias/RLS although it was not clear this was specifically related to the statin itself. LE arterial testing 2020 was normal. Last echo 06/30/21 showed EF 60-65%, grade 1 Dd, mild-moderate AI, mild dilation of ascending aorta, stable from prior.  In a previous year she had reported lower extremity edema and facial edema not markedly appreciated at that time on exam. Amlodipine was changed to HCTZ but eventually switched back at a later time by primary care as she had muscle  cramping -> in 06/2021 she wished to revisit diuretic for trace edema so was trialed on spironolactone but felt poorly with this (felt like she was not able to void) so went back to amlodipine. Her rosuvastatin was also decreased to 25m daily due to muscle cramping. At last OV with me 09/2021, CK was elevated and so statin was stopped with plan to recheck but this was not yet obtained. She also reported intermittent episodes of fleeting dizziness so event monitor was pursued showing predominantly NSR with average HR 75bpm, with 20 nonsustained SVT events (longest 14 beats), triggered events mostly correlated with NSR, occasionally with PACS/PVCs and 1 run of SVT - no sustained arrhythmias or pauses were noted. I had suggested initiation of carvedilol to address both SVT and elevated BP.  She returns for follow-up today with her daughter. She reports a significant increase in lightheadedness over the last several days, yesterday being the worst. This has been associated with difficulty forming thoughts as well as a sense of disequilibrium when she walks. She still feels mildly dizzy today while VS are normal. She has been taking her carvedilol. Her BP has been running mildly elevated at home but she has also recently been treated for various other issues including right ankle pain prompting prednisone last week as well as doxycycline for a buttock abscess for which she actually had a CT scan pending. She has rare feeling palpitations and atypical focal sharp chest pains that are unchanged from prior.   Labwork independently reviewed: 09/2021 K 4.3, Cr 0.93, LFTS ok, CK 245, TSH wnl, CBC wnl, LDL 43, trig 107 07/08/2021 labs WBC wnl Hgb 11.5, Plt 288, LFTS  wnl, lipase 67, sed rate 36, Mg 2.0, K 4.4, Cr 0.99 05/2021 LDL 23, trig 121 11/2020 Mg 2.0, K 4.1, Cr 0.99, LFTs ok, A1C 6.3, thyroid normal  Cardiology Studies:   Studies reviewed are outlined and summarized above. Reports included below if pertinent.    Cor CTA 07/2020 FINDINGS: A 100 kV prospective scan was triggered in the descending thoracic aorta at 111 HU's. Axial non-contrast 3 mm slices were carried out through the heart. The data set was analyzed on a dedicated work station and scored using the Olmito and Olmito. Gantry rotation speed was 250 msecs and collimation was .6 mm. 50 mg of PO Metoprolol and 0.8 mg of sl NTG was given. The 3D data set was reconstructed in 5% intervals of the 67-82 % of the R-R cycle. Diastolic phases were analyzed on a dedicated work station using MPR, MIP and VRT modes. The patient received 80 cc of contrast.   Aorta: Normal size. Minimal atherosclerotic plaque and calcifications. No dissection.   Aortic Valve:  Trileaflet.  Trivial calcifications.   Coronary Arteries:  Normal coronary origin.  Right dominance.   RCA is a large dominant artery that gives rise to PDA and PLA. There is moderate diffuse calcified plaque in the proximal portion with stenosis 50-69%. Distal RCA has moderate mixed plaque with stenosis 50-69%. PLA and PDA are small with minimal plaque.   Left main is a large artery that gives rise to LAD and LCX arteries. Left main is a large vessel with only luminal irregularities.   LAD is a large vessel that gives rise to one diagonal artery. There is mild predominantly calcified plaque in the proximal portion with stenosis 25-49%. Mid and distal LAD have only luminal irregularities.   D1 is small and has mild calcified focal stenosis in the proximal portion with stenosis 25-49%.   LCX is a non-dominant artery that gives rise to one large OM1 branch. There are only minimal irregularities.   Other findings:   Normal pulmonary vein drainage into the left atrium.   Normal left atrial appendage without a thrombus.   Normal size of the pulmonary artery.   IMPRESSION: 1. Coronary calcium score of 199. This was 2 percentile for age and sex matched control. In 2018 it was  calcium score of 56 (3 percentile for age and sex matched control).   2. Normal coronary origin with right dominance.   3. CAD-RADS 3. Moderate stenosis in the proximal and distal RCA, mild stenosis in the proximal LAD. Consider symptom-guided anti-ischemic pharmacotherapy as well as risk factor modification per guideline directed care. Additional analysis with CT FFR will be submitted. There is progression of disease since the prior CCTA in 2018.   FINDINGS: FFRct analysis was performed on the original cardiac CT angiogram dataset. Diagrammatic representation of the FFRct analysis is provided in a separate PDF document in PACS. This dictation was created using the PDF document and an interactive 3D model of the results. 3D model is not available in the EMR/PACS. Normal FFR range is >0.80.   1. Left Main: 0.99.   2. LAD: Proximal: 0.98, mid: 0.94, distal: 0.90. 3. D1: 0.96. 4. LCX: 0.99. 5. OM1: 0.98 6. RCA: Proximal: 0.99, mid: 0.94, distal: 0.88.   IMPRESSION: 1. CT FFR analysis didn't show any significant stenosis. Intensify medical management to slow down disease progression.     Electronically Signed   By: Ena Dawley   On: 08/26/2020 22:37   Monitor 07/2020 Patient had a min HR  of 42 bpm, max HR of 176 bpm, and avg HR of 75 bpm 11 very short Supraventricular Tachycardia runs occurred, the run with the fastest interval lasting 4 beats with a max rate of 176 bpm, the longest lasting 10 beats with an avg rate of 114 bpm. Rare PACs and PVCs.   Predominant underlying rhythm was Sinus Rhythm. 11 very short Supraventricular Tachycardia runs occurred. the run with the fastest interval lasting 4 beats with a max rate of 176 bpm, the  longest lasting 10 beats with an avg rate of 114 bpm.    2D echo 06/2020    1. Left ventricular ejection fraction, by estimation, is 60 to 65%. The  left ventricle has normal function. The left ventricle has no regional  wall motion  abnormalities. There is mild concentric left ventricular  hypertrophy. Left ventricular diastolic  parameters are consistent with Grade I diastolic dysfunction (impaired  relaxation).   2. Right ventricular systolic function is normal. The right ventricular  size is normal.   3. The mitral valve is normal in structure. No evidence of mitral valve  regurgitation. No evidence of mitral stenosis.   4. The aortic valve is tricuspid. Aortic valve regurgitation is mild to  moderate. No aortic stenosis is present.   5. Aortic dilatation noted. There is mild dilatation of the ascending  aorta, measuring 38 mm.   6. The inferior vena cava is normal in size with greater than 50%  respiratory variability, suggesting right atrial pressure of 3 mmHg.   Comparison(s): No significant change from prior study. Prior images  reviewed side by side   2D Echo 06/30/21  1. Left ventricular ejection fraction, by estimation, is 60 to 65%. The  left ventricle has normal function. The left ventricle has no regional  wall motion abnormalities. There is mild concentric left ventricular  hypertrophy. Left ventricular diastolic  parameters are consistent with Grade I diastolic dysfunction (impaired  relaxation).   2. Right ventricular systolic function is normal. The right ventricular  size is normal.   3. The mitral valve is normal in structure. Trivial mitral valve  regurgitation.   4. The aortic valve is tricuspid. There is mild calcification of the  aortic valve. There is mild thickening of the aortic valve. Aortic valve  regurgitation is mild to moderate. Aortic valve sclerosis is present, with  no evidence of aortic valve  stenosis.   5. There is mild dilatation of the ascending aorta, measuring 37 mm.   6. The inferior vena cava is normal in size with greater than 50%  respiratory variability, suggesting right atrial pressure of 3 mmHg.   Comparison(s): Compared to prior TTE in 06/2020, there is no  significant  change. The AR remains mild-to-moderate and the ascending aorta size  remains stable in size (previously reported at 33m).    Monitor 10/2021  Patient wear time was 14 days and 7 hours Predominant rhythm was NSR with average HR 75bpm A total of 20 nonsustained SVT episodes occurred with longest lasting 14 beats. Rare SVE and VE (<1%) Patient triggered events mainly correlated with NSR; occasionally with SVE, VE, and 1 run of SVT No sustained arrhythmias or significant pauses     Patch Wear Time:  14 days and 7 hours (2023-03-24T10:27:38-0400 to 2023-04-07T18:20:38-0400)   Monitor 1 Patient had a min HR of 44 bpm, max HR of 193 bpm, and avg HR of 75 bpm. Predominant underlying rhythm was Sinus Rhythm. 3 Supraventricular Tachycardia runs occurred, the run with  the fastest interval lasting 7 beats with a max rate of 193 bpm (avg 172  bpm); the run with the fastest interval was also the longest. Isolated SVEs were rare (<1.0%), SVE Couplets were rare (<1.0%), and SVE Triplets were rare (<1.0%). Isolated VEs were rare (<1.0%), and no VE Couplets or VE Triplets were present.    Monitor 2 Patient had a min HR of 41 bpm, max HR of 197 bpm, and avg HR of 75 bpm. Predominant underlying rhythm was Sinus Rhythm. 17 Supraventricular Tachycardia runs occurred, the run with the fastest interval lasting 5 beats with a max rate of 197 bpm, the  longest lasting 14 beats with an avg rate of 123 bpm. Supraventricular Tachycardia was detected within +/- 45 seconds of symptomatic patient event(s). Isolated SVEs were rare (<1.0%), SVE Couplets were rare (<1.0%), and SVE Triplets were rare (<1.0%).  Isolated VEs were rare (<1.0%), VE Couplets were rare (<1.0%), and no VE Triplets were present. Ventricular Bigeminy was present.    Gwyndolyn Kaufman, MD    Past Medical History:  Diagnosis Date   Allergy    seasonal   Ankylosing spondylitis ([place])    Anxiety    Arthritis    Bell palsy    CAD  (coronary artery disease)    a. Coronary CT 10/2016 showed calcium score in the 59th perecntile, moderate CAD in RCA/PDA and narrowing of the mid to distal LAD, with negative FFR.   CTS (carpal tunnel syndrome)    Cystitis    Diabetes mellitus without complication ([place])    Diverticulitis    Diverticulosis of colon    Dry eye syndrome    Exocrine pancreatic insufficiency    Frozen shoulder    GERD (gastroesophageal reflux disease)    HLA B27 (HLA B27 positive)    Hx of colonic polyps    Hyperlipidemia    NMR 2005   Hypertension    Hypothyroidism    Incontinence    Internal hemorrhoids    Interstitial cystitis    Kidney stone    Menopause    Mild aortic insufficiency    Mild ascending aorta dilatation ([facility])    Osteoarthritis    Osteopenia    [facility] [address]   Pre-diabetes    PSVT (paroxysmal supraventricular tachycardia) ([place])    S/P total hysterectomy and bilateral salpingo-oophorectomy 04/12/2014   Sinusitis    Sleep apnea    c-pap   Subjective visual disturbance of both eyes 12/30/2013   Tubular adenoma of colon    Vitamin D deficiency    Xerostomia    and Xeroophthalmia    Past Surgical History:  Procedure Laterality Date   BLADDER SURGERY     for incontinence   CARPAL TUNNEL RELEASE Left 12/22/2014   Procedure: LEFT CARPAL TUNNEL RELEASE;  Surgeon: Daryll Brod, MD;  Location: [place];  Service: Orthopedics;  Laterality: Left;   CARPAL TUNNEL RELEASE Right 02/18/2019   Procedure: RIGHT CARPAL TUNNEL RELEASE;  Surgeon: Daryll Brod, MD;  Location: [place];  Service: Orthopedics;  Laterality: Right;   COLONOSCOPY W/ POLYPECTOMY     adenomatous poyp 04-2008   colonscopy     tics 10-2002   CYSTOSCOPY WITH RETROGRADE PYELOGRAM, URETEROSCOPY AND STENT PLACEMENT Left 09/01/2013   Procedure: CYSTOSCOPY WITH RETROGRADE PYELOGRAM, AND LEFT STENT PLACEMENT;  Surgeon: Molli Hazard, MD;  Location: [facility];  Service:  Urology;  Laterality: Left;   elevated LFTs     due  to [place]   FINGER SURGERY Left 2004   2nd finger   G 1 P 1     interstitial cystitis     with clot in catheter   PARTIAL HYSTERECTOMY     with one ovary left   SHOULDER ARTHROSCOPY Left    TOTAL ABDOMINAL HYSTERECTOMY W/ BILATERAL SALPINGOOPHORECTOMY     dysfunctional menses    Current Medications: Current Meds  Medication Sig   acetaminophen (TYLENOL) 500 MG tablet Take 1,000 mg by mouth 3 (three) times daily as needed.    amLODipine (NORVASC) 2.5 MG tablet Take 1 tablet (2.5 mg total) by mouth daily.   Ascorbic Acid (VITAMIN C) 1000 MG tablet Take 1,000 mg by mouth 2 (two) times daily.    aspirin 81 MG tablet Take 81 mg by mouth daily.   carvedilol (COREG) 3.125 MG tablet Take 1 tablet (3.125 mg total) by mouth 2 (two) times daily.   Cholecalciferol (VITAMIN D-3) 25 MCG (1000 UT) CAPS Take 10,000 Units by mouth daily.    diazepam (VALIUM) 5 MG tablet One tab by mouth, 2 hours before procedure.   diclofenac Sodium (VOLTAREN) 1 % GEL Apply topically 4 (four) times daily as needed.    dicyclomine (BENTYL) 10 MG capsule Take 1 capsule (10 mg total) by mouth 3 (three) times daily as needed for spasms.   doxycycline (VIBRA-TABS) 100 MG tablet Take 1 tablet (100 mg total) by mouth 2 (two) times daily for 14 days.   EPINEPHrine (EPIPEN 2-PAK) 0.3 mg/0.3 mL IJ SOAJ injection Use as directed for severe allergic reactions   HYDROcodone-acetaminophen (NORCO/VICODIN) 5-325 MG tablet Take 1 tablet by mouth every 6 (six) hours as needed for moderate pain.   lipase/protease/amylase (CREON) 36000 UNITS CPEP capsule Take 2 capsules by mouth with each meal, 1 capsule by mouth with each snack   magnesium oxide (MAG-OX) 400 MG tablet Take 200 mg by mouth daily.   Multiple Vitamins-Minerals (CENTRUM SILVER 50+WOMEN PO) Take 1 tablet by mouth daily.   naloxone (NARCAN) 4 MG/0.1ML LIQD nasal spray kit Narcan 4 mg/actuation nasal spray  PLACE 1 SPRAY  INTO EACH NOSTRIL FOR SUSPECTED OPIOID OVERDOSE. MAY REPEAT 2 - 3 MINUTES WITH NEW BOTTLE IN OTHER NOSTRIL   olopatadine (PATANOL) 0.1 % ophthalmic solution Place 1 drop into both eyes 2 (two) times daily.   Omega-3 Fatty Acids (FISH OIL BURP-LESS) 1000 MG CAPS Takes 1000 mg BID   omeprazole (PRILOSEC) 40 MG capsule Take 1 capsule (40 mg total) by mouth daily.   ondansetron (ZOFRAN) 4 MG tablet Take 1 tablet (4 mg total) by mouth every 8 (eight) hours as needed.   tiZANidine (ZANAFLEX) 4 MG tablet Take 1 tablet (4 mg total) by mouth every 6 (six) hours as needed for muscle spasms.   vitamin B-12 (CYANOCOBALAMIN) 1000 MCG tablet Take 1,000 mcg by mouth 2 (two) times daily.    vitamin E 400 UNIT capsule Take 400 Units by mouth daily.   zinc gluconate 50 MG tablet Take 50 mg by mouth daily.      Allergies:   Bactrim [sulfamethoxazole-trimethoprim], Nitroglycerin, Pentosan polysulfate, Pentosan polysulfate sodium, Promethazine hcl, Sulfamethoxazole-trimethoprim, Losartan, Promethazine hcl, and Lisinopril   Social History   Socioeconomic History   Marital status: Married    Spouse name: Not on file   Number of children: 1   Years of education: BS   Highest education level: Not on file  Occupational History   Occupation: retired   Occupation: Pharmacist, hospital    Comment:  Kindergarten  Tobacco Use   Smoking status: Never   Smokeless tobacco: Never  Vaping Use   Vaping Use: Never used  Substance and Sexual Activity   Alcohol use: Yes    Comment: social   Drug use: No   Sexual activity: Not Currently  Other Topics Concern   Not on file  Social History Narrative   Lives at home with husband.   No daily use of caffeine.   Right-handed.         Social Determinants of Health   Financial Resource Strain: Low Risk  (03/16/2021)   Overall Financial Resource Strain (CARDIA)    Difficulty of Paying Living Expenses: Not hard at all  Food Insecurity: No Food Insecurity (03/16/2021)   Hunger Vital  Sign    Worried About Running Out of Food in the Last Year: Never true    Ran Out of Food in the Last Year: Never true  Transportation Needs: No Transportation Needs (03/16/2021)   PRAPARE - Hydrologist (Medical): No    Lack of Transportation (Non-Medical): No  Physical Activity: Sufficiently Active (03/16/2021)   Exercise Vital Sign    Days of Exercise per Week: 5 days    Minutes of Exercise per Session: 30 min  Stress: No Stress Concern Present (03/16/2021)   [place]    Feeling of Stress : Not at all  Social Connections: [place] (03/16/2021)   Social Connection and Isolation Panel [NHANES]    Frequency of Communication with Friends and Family: More than three times a week    Frequency of Social Gatherings with Friends and Family: Once a week    Attends Religious Services: More than 4 times per year    Active Member of [employer] or Organizations: Yes    Attends Music therapist: More than 4 times per year    Marital Status: Married     Family History:  The patient's family history includes Allergic rhinitis in her sister and sister; Asthma in her mother; Breast cancer in her maternal aunt; Cirrhosis in her father; Colon cancer (age of onset: 61) in her sister; Coronary artery disease in her maternal grandfather and mother; Dementia in her mother; Diabetes in her father, paternal aunt, and paternal grandmother; Food Allergy in her sister; Hypertension in her mother; Renal cancer in her paternal aunt; Stroke in her mother. There is no history of Esophageal cancer or Stomach cancer.  ROS:   Please see the history of present illness. + chronic blurry vision All other systems are reviewed and otherwise negative.    EKG(s)/Additional Labs   EKG:  EKG is ordered today, personally reviewed, demonstrating SB 59bpm, left axis deviation, poor R wave progression with nonspecific  TW changes similar to prior.  Recent Labs: 07/26/2021: Magnesium 2.0 10/21/2021: ALT 20; BUN 17; Creatinine, Ser 0.93; Hemoglobin 12.0; Platelets 297; Potassium 4.3; Sodium 142; TSH 2.400  Recent Lipid Panel    Component Value Date/Time   CHOL 123 10/21/2021 1055   TRIG 107 10/21/2021 1055   TRIG 181 (H) 07/23/2006 1101   HDL 61 10/21/2021 1055   CHOLHDL 2.0 10/21/2021 1055   CHOLHDL 2 06/03/2021 1212   VLDL 24.2 06/03/2021 1212   LDLCALC 43 10/21/2021 1055   LDLDIRECT 114.0 05/02/2016 0919    PHYSICAL EXAM:    VS:  BP 138/84   Pulse 61   Ht 5' 2.5" (1.588 m)   Wt 156 lb (70.8  kg) Comment: per pt. Refused to weigh  SpO2 98%   BMI 28.08 kg/m   BMI: Body mass index is 28.08 kg/m.  GEN: Well nourished, well developed female in no acute distress HEENT: normocephalic, atraumatic Neck: no JVD, carotid bruits, or masses Cardiac: RRR; no murmurs, rubs, or gallops, no edema  Respiratory:  clear to auscultation bilaterally, normal work of breathing GI: soft, nontender, nondistended, + BS MS: no deformity or atrophy Skin: warm and dry, no rash Neuro:  Alert and Oriented x 3, Strength and sensation are intact, follows commands Psych: euthymic mood, full affect  Wt Readings from Last 3 Encounters:  01/18/22 156 lb (70.8 kg)  01/06/22 157 lb (71.2 kg)  10/21/21 154 lb (69.9 kg)     ASSESSMENT & PLAN:   1. Lightheadedness - unclear etiology, does not appear specifically related to cardiac status. She still feels this even while VSS and in NSR with HR 50s-60s. I am concerned about her complaints of brain fog and disequilibrium as well. She and her daughter had discussed yesterday about proceeding to ER when this was at its worst but she held off. Since I cannot easily explain this from anything we are seeing in the office today, given worsening symptoms with potential need for imaging, I've recommended they proceed to ER for further neurologic evaluation. She was also dealing with  several other unrelated medical issues recently including an ankle effusion as well as a buttocks abscess so need to make sure nothing medical going on. We will arrange an outpatient carotid duplex (if not done in the hospital).  2. PSVT - continue carvedilol, only fleeting brief palpitations at this point, no changes in regimen today. Do not think this is causing her symptoms.  3. Moderate CAD with HLD goal LDL <70, but elevated CK - she has chronic atypical focal chest pains that do not seem acutely changed today. EKG is stable. She will continue ASA and carvedilol. Statin was stopped in setting of elevated CK. She sees rheumatology next week. Will keep off statin until seen by them so as not to confuse the picture; anticipate further bloodwork on their end forthcoming but we can review in follow-up.  4. Essential HTN - BP's mildly elevated but also in context of recent course of prednisone. No acute changes made today. Can be addressed in follow-up.  5. Mild-moderate AI with mild dilation of ascending aorta - anticipate echo 06/2022, can be ordered closer to that time. Of note, CTA 07/2020 did not comment on any abnormalities of the aortic size at all. Continue clinical surveillance with echo.    Disposition: F/u with in 3-4 weeks.   Medication Adjustments/Labs and Tests Ordered: Current medicines are reviewed at length with the patient today.  Concerns regarding medicines are outlined above. Medication changes, Labs and Tests ordered today are summarized above and listed in the Patient Instructions accessible in Encounters.   Signed, Charlie Pitter, PA-C  01/18/2022 1:56 PM    [facility] Phone: [phone]; Fax: [phone]

## 2022-01-18 NOTE — ED Triage Notes (Signed)
Pt here with reports of difficulty forming thoughts as well as disequilibrium while ambulating. Pt also endorses feeling lightheaded. Symptoms started yesterday.

## 2022-01-18 NOTE — ED Provider Notes (Signed)
[facility] EMERGENCY DEPARTMENT Provider Note   CSN: [id number] Arrival date & time: 01/18/22  1447     History  Chief Complaint  Patient presents with   Stroke Symptoms    [name] is a 76 y.o. female.  history of moderate CAD by coronary CT, aortic atherosclerosis, mild-moderate AI by echo 06/2021, mildly dilated ascending aorta, HTN, PSVT, ankylosing spondylitis/HLA B27 positive, anxiety, osteoarthritis, Bell's palsy, diverticulosis, pancreatic insufficiency, HLD, HTN, interstitial cystitis, OA, pre-DM, OSA on CPAP (followed at [facility]), mild anemia who presents with lightheadedness, room spinning dizziness, confusion and memory issues since yesterday.  She was seen by her cardiologist today and referred to the ED with concern for possible stroke. Patient states since yesterday morning she feels "spinning on the inside" with difficulty walking and feeling off balance.  Also reports she has had some confusion and memory issues since yesterday.  Denies any facial droop, difficulty speaking or difficulty swallowing.  No focal weakness to her arms or legs.  No numbness or tingling.  No chest pain or shortness of breath.  No vision changes. Reports she recently completed a 5-day course of prednisone for ankle pain as she has many issues with her ankles bilaterally.  She has also been on doxycycline for a infection to her buttock and is scheduled to have a CT scan tomorrow.  She reports the area drained several days ago and is improving.  Denies any other new medications.  No fever.  No vomiting or diarrhea.  Good p.o. intake and urine output. Reports symptoms are improving in terms of her dizziness though she feels off balance ever since receiving sedation medication for MRI  The history is provided by the patient and a relative.       Home Medications Prior to Admission medications   Medication Sig Start Date End Date Taking? Authorizing Provider  acetaminophen  (TYLENOL) 500 MG tablet Take 1,000 mg by mouth 3 (three) times daily as needed.     [provider]  amLODipine (NORVASC) 2.5 MG tablet Take 1 tablet (2.5 mg total) by mouth daily. 09/20/21   Dunn, Nedra Hai, PA-C  Ascorbic Acid (VITAMIN C) 1000 MG tablet Take 1,000 mg by mouth 2 (two) times daily.     [provider]  aspirin 81 MG tablet Take 81 mg by mouth daily.    [provider]  carvedilol (COREG) 3.125 MG tablet Take 1 tablet (3.125 mg total) by mouth 2 (two) times daily. 11/29/21   Dunn, Nedra Hai, PA-C  Cholecalciferol (VITAMIN D-3) 25 MCG (1000 UT) CAPS Take 10,000 Units by mouth daily.     [provider]  diazepam (VALIUM) 5 MG tablet One tab by mouth, 2 hours before procedure. 01/10/22   Lyndal Pulley, DO  diclofenac Sodium (VOLTAREN) 1 % GEL Apply topically 4 (four) times daily as needed.     [provider]  dicyclomine (BENTYL) 10 MG capsule Take 1 capsule (10 mg total) by mouth 3 (three) times daily as needed for spasms. 06/03/21   Binnie Rail, MD  doxycycline (VIBRA-TABS) 100 MG tablet Take 1 tablet (100 mg total) by mouth 2 (two) times daily for 14 days. 01/06/22 01/20/22  Binnie Rail, MD  EPINEPHrine (EPIPEN 2-PAK) 0.3 mg/0.3 mL IJ SOAJ injection Use as directed for severe allergic reactions 01/06/22   Binnie Rail, MD  HYDROcodone-acetaminophen (NORCO/VICODIN) 5-325 MG tablet Take 1 tablet by mouth every 6 (six) hours as needed for moderate pain.  01/10/22   Binnie Rail, MD  lipase/protease/amylase (CREON) 36000 UNITS CPEP capsule Take 2 capsules by mouth with each meal, 1 capsule by mouth with each snack 07/08/21   Esterwood, Amy S, PA-C  magnesium oxide (MAG-OX) 400 MG tablet Take 200 mg by mouth daily.    [provider]  Multiple Vitamins-Minerals (CENTRUM SILVER 50+WOMEN PO) Take 1 tablet by mouth daily.    [provider]  naloxone (NARCAN) 4 MG/0.1ML LIQD nasal spray kit Narcan 4 mg/actuation nasal spray  PLACE 1  SPRAY INTO EACH NOSTRIL FOR SUSPECTED OPIOID OVERDOSE. MAY REPEAT 2 - 3 MINUTES WITH NEW BOTTLE IN OTHER NOSTRIL    [provider]  olopatadine (PATANOL) 0.1 % ophthalmic solution Place 1 drop into both eyes 2 (two) times daily. 11/29/20   Binnie Rail, MD  Omega-3 Fatty Acids (FISH OIL BURP-LESS) 1000 MG CAPS Takes 1000 mg BID 01/03/16   Esterwood, Amy S, PA-C  omeprazole (PRILOSEC) 40 MG capsule Take 1 capsule (40 mg total) by mouth daily. 07/08/21 07/08/22  Esterwood, Amy S, PA-C  ondansetron (ZOFRAN) 4 MG tablet Take 1 tablet (4 mg total) by mouth every 8 (eight) hours as needed. 01/06/22   Binnie Rail, MD  predniSONE (DELTASONE) 50 MG tablet Take one tablet daily for the next 5 days. Patient not taking: Reported on 01/18/2022 01/11/22   Lyndal Pulley, DO  rosuvastatin (CRESTOR) 10 MG tablet Take 1 tablet (10 mg total) by mouth daily. 07/11/21 10/21/21  Dunn, Nedra Hai, PA-C  tiZANidine (ZANAFLEX) 4 MG tablet Take 1 tablet (4 mg total) by mouth every 6 (six) hours as needed for muscle spasms. 10/05/21   Gregor Hams, MD  vitamin B-12 (CYANOCOBALAMIN) 1000 MCG tablet Take 1,000 mcg by mouth 2 (two) times daily.     [provider]  vitamin E 400 UNIT capsule Take 400 Units by mouth daily.    [provider]  zinc gluconate 50 MG tablet Take 50 mg by mouth daily.    [provider]      Allergies    Bactrim [sulfamethoxazole-trimethoprim], Nitroglycerin, Pentosan polysulfate, Pentosan polysulfate sodium, Promethazine hcl, Sulfamethoxazole-trimethoprim, Losartan, Promethazine hcl, and Lisinopril    Review of Systems   Review of Systems  Constitutional:  Positive for fatigue. Negative for activity change, appetite change and fever.  Respiratory:  Negative for chest tightness and shortness of breath.   Gastrointestinal:  Negative for abdominal pain, nausea and vomiting.  Musculoskeletal:  Positive for arthralgias and myalgias.  Neurological:  Positive for  dizziness, speech difficulty, weakness and light-headedness. Negative for facial asymmetry, numbness and headaches.   all other systems are negative except as noted in the HPI and PMH.    Physical Exam Updated Vital Signs BP (!) 155/96 (BP Location: Right Arm)   Pulse 68   Temp 98 F (36.7 C)   Resp 15   SpO2 95%  Physical Exam Vitals and nursing note reviewed.  Constitutional:      General: She is not in acute distress.    Appearance: She is well-developed.  HENT:     Head: Normocephalic and atraumatic.     Mouth/Throat:     Pharynx: No oropharyngeal exudate.  Eyes:     Conjunctiva/sclera: Conjunctivae normal.     Pupils: Pupils are equal, round, and reactive to light.  Neck:     Comments: No meningismus. Cardiovascular:     Rate and Rhythm: Normal rate and regular rhythm.     Heart  sounds: Normal heart sounds. No murmur heard. Pulmonary:     Effort: Pulmonary effort is normal. No respiratory distress.     Breath sounds: Normal breath sounds.  Abdominal:     Palpations: Abdomen is soft.     Tenderness: There is no abdominal tenderness. There is no guarding or rebound.  Genitourinary:    Comments: Chaperone present Journalist, newspaper.  There is area of erythema to her left medial buttock that is nontender.  There is no fluctuance or induration.  Appears to be healing Musculoskeletal:        General: No tenderness. Normal range of motion.     Cervical back: Normal range of motion and neck supple.  Skin:    General: Skin is warm.  Neurological:     Mental Status: She is alert and oriented to person, place, and time.     Cranial Nerves: No cranial nerve deficit.     Motor: No abnormal muscle tone.     Coordination: Coordination normal.     Comments: CN 2-12 intact, no ataxia on finger to nose, no nystagmus, 5/5 strength throughout, no pronator drift, positive Romberg, gait is normal. States she feels off after receiving sedation medication for MRI.  Psychiatric:         Behavior: Behavior normal.     ED Results / Procedures / Treatments   Labs (all labs ordered are listed, but only abnormal results are displayed) Labs Reviewed  COMPREHENSIVE METABOLIC PANEL - Abnormal; Notable for the following components:      Result Value   Glucose, Bld 101 (*)    All other components within normal limits  CBC  PROTIME-INR  AMMONIA  TSH  URINALYSIS, ROUTINE W REFLEX MICROSCOPIC  TROPONIN I (HIGH SENSITIVITY)  TROPONIN I (HIGH SENSITIVITY)  TROPONIN I (HIGH SENSITIVITY)    EKG EKG Interpretation  Date/Time:  Wednesday January 18 2022 16:00:59 EDT Ventricular Rate:  58 PR Interval:  136 QRS Duration: 84 QT Interval:  404 QTC Calculation: 396 R Axis:   -51 Text Interpretation: Sinus bradycardia Left anterior fascicular block Possible Anterior infarct , age undetermined Abnormal ECG When compared with ECG of 17-May-2018 01:22, PREVIOUS ECG IS PRESENT No significant change was found Confirmed by Ezequiel Essex [phone]) on 01/18/2022 8:17:09 PM  Radiology MR BRAIN WO CONTRAST  Result Date: 01/18/2022 CLINICAL DATA:  Neuro deficit, stroke suspected EXAM: MRI HEAD WITHOUT CONTRAST TECHNIQUE: Multiplanar, multiecho pulse sequences of the brain and surrounding structures were obtained without intravenous contrast. COMPARISON:  MRI 08/06/2020 FINDINGS: Brain: There is no acute intracranial hemorrhage, extra-axial fluid collection, or acute infarct. Parenchymal volume is normal. The ventricles are normal in size. Gray-white differentiation is preserved. Patchy FLAIR signal abnormality throughout the subcortical and periventricular white matter is nonspecific but likely reflects sequela of mild chronic white matter microangiopathy. There is no suspicious parenchymal signal abnormality. There is no mass lesion. There is no mass effect or midline shift. Vascular: Normal flow voids. Skull and upper cervical spine: Normal marrow signal. Sinuses/Orbits: The paranasal sinuses are  clear. A right lens implant is noted. The globes and orbits are otherwise unremarkable. Other: None. IMPRESSION: 1. No acute intracranial pathology. 2. Background mild chronic white matter microangiopathy. Electronically Signed   By: Valetta Mole M.D.   On: 01/18/2022 17:40    Procedures Procedures    Medications Ordered in ED Medications  LORazepam (ATIVAN) tablet 1 mg (1 mg Oral Given 01/18/22 1609)    ED Course/ Medical Decision Making/ A&P  2-day history of confusion, dizziness, room spinning, feeling off balance.  Neurological exam is nonfocal.  EKG is normal sinus rhythm. Code stroke not activated due to delay in presentation and out of window for thrombolytics.  Low suspicion for acute CVA but wonder whether her symptoms may be due to dehydration, possible medication related as she has been on prednisone and doxycycline.  Has cellulitis to her buttock which is improving without evidence of drainable abscess currently.  D/w Dr. Lorrin Goodell of neurology.  He does not favor TIA as symptoms have been ongoing for 2 days but improving.  Patient with recent echocardiogram last year that shows aortic insufficiency.  He does believe basilar artery insufficiency should be ruled out with CTA.  If this is reassuring does not feel patient needs further neurological work-up as an inpatient.  Her cardiologist is setting her up for carotid Dopplers and she had an echocardiogram last year.  He does take aspirin  Orthostatics mildly positive.  Heart rate elevates from 84-100 lying to standing.  Blood pressure 342 systolic to 876 systolic.  IV fluids are given  Labs are reassuring.  Troponin negative x2.  Low suspicion for ACS or pulmonary embolism.  Patient able to ambulate without assistance and tolerating p.o. No fevers.  States she is scheduled for CT scan tomorrow of her buttock wound.  No evidence of abscess that is drainable tonight.  Suspect her symptoms may be due  to prednisone use or possibly doxycycline use coupled with dehydration. Anticipate discharge home with p.o. fluids and PCP follow-up. Care to be transferred at shift change with CTA pending.         Final Clinical Impression(s) / ED Diagnoses Final diagnoses:  Dizziness    Rx / DC Orders ED Discharge Orders     None         Kerim Statzer, Annie Main, MD 01/18/22 2332

## 2022-01-18 NOTE — ED Notes (Signed)
Pt successfully drank cup of water

## 2022-01-18 NOTE — ED Provider Triage Note (Cosign Needed)
Emergency Medicine Provider Triage Evaluation Note  [name] , a 76 y.o. female  was evaluated in triage.  Pt complains of dizziness that she describes as feeling LH.  She states Monday (2 d ago) she felt unwell and weak but her symptoms worsened yesterday. She went to her cardiologist today and was sent to ER d/t concern for stroke.   No slurred speech or word finding difficulty. More forgetful.   Shoulder issues left side.  No weakness per pt.   Review of Systems  Positive: Weakness, LH Negative: Fever   Physical Exam  BP (!) 193/87   Pulse 62   Temp 98 F (36.7 C)   Resp 14   SpO2 100%  Gen:   Awake, no distress   Resp:  Normal effort  MSK:   Moves extremities without difficulty  Other:    Alert and oriented to self, place, time and event.   Speech is fluent, clear without dysarthria or dysphasia.   Strength 5/5 in upper/lower extremities   Sensation intact in upper/lower extremities   Normal gait.  Normal finger-to-nose and feet tapping.  CN I not tested  CN II grossly intact visual fields bilaterally. Did not visualize posterior eye.  CN III, IV, VI PERRLA and EOMs intact bilaterally  CN V Intact sensation to sharp and light touch to the face  CN VII facial movements symmetric  CN VIII not tested  CN IX, X no uvula deviation, symmetric rise of soft palate  CN XI 5/5 SCM and trapezius strength bilaterally  CN XII Midline tongue protrusion, symmetric L/R movements    Medical Decision Making  Medically screening exam initiated at 3:38 PM.  Appropriate orders placed.  [name] was informed that the remainder of the evaluation will be completed by another provider, this initial triage assessment does not replace that evaluation, and the importance of remaining in the ED until their evaluation is complete.  Pt workup reassuring. Doubt stroke but given cardiology concerns and sx will order brain MRI. No headache. No neuro sx. Unnecessary to obtain CT head at  this time.    [name], [place] 01/18/22 1546

## 2022-01-18 NOTE — ED Notes (Signed)
Pt ambulated with no assistance needed. 

## 2022-01-18 NOTE — Discharge Instructions (Signed)
Your testing today does not show any evidence of stroke.  As we discussed, your symptoms may be due to prednisone or doxycycline.  You are also slightly dehydrated given IV fluids.  Take your medications as prescribed.  Follow-up with your doctor as scheduled.  Return to the ED with chest pain, shortness of breath, difficulty walking, difficulty speaking or any other concerns.

## 2022-01-19 ENCOUNTER — Telehealth: Payer: Self-pay | Admitting: Physician Assistant

## 2022-01-19 ENCOUNTER — Ambulatory Visit
Admission: RE | Admit: 2022-01-19 | Discharge: 2022-01-19 | Disposition: A | Discharge status: Home / Self Care | Payer: Medicare Other | Source: Ambulatory Visit | Attending: Internal Medicine | Admitting: Internal Medicine

## 2022-01-19 DIAGNOSIS — L0231 Cutaneous abscess of buttock: Secondary | ICD-10-CM

## 2022-01-19 DIAGNOSIS — Z90710 Acquired absence of both cervix and uterus: Secondary | ICD-10-CM | POA: Diagnosis not present

## 2022-01-19 DIAGNOSIS — K429 Umbilical hernia without obstruction or gangrene: Secondary | ICD-10-CM | POA: Diagnosis not present

## 2022-01-19 DIAGNOSIS — M47816 Spondylosis without myelopathy or radiculopathy, lumbar region: Secondary | ICD-10-CM | POA: Diagnosis not present

## 2022-01-19 DIAGNOSIS — K5730 Diverticulosis of large intestine without perforation or abscess without bleeding: Secondary | ICD-10-CM | POA: Diagnosis not present

## 2022-01-19 DIAGNOSIS — R42 Dizziness and giddiness: Secondary | ICD-10-CM

## 2022-01-19 LAB — URINALYSIS, ROUTINE W REFLEX MICROSCOPIC
Bilirubin Urine: NEGATIVE
Glucose, UA: NEGATIVE mg/dL
Hgb urine dipstick: NEGATIVE
Ketones, ur: NEGATIVE mg/dL
Leukocytes,Ua: NEGATIVE
Nitrite: NEGATIVE
Protein, ur: NEGATIVE mg/dL
Specific Gravity, Urine: 1.011 (ref 1.005–1.030)
pH: 7 (ref 5.0–8.0)

## 2022-01-19 MED ORDER — IOPAMIDOL (ISOVUE-300) INJECTION 61%
100.0000 mL | Freq: Once | INTRAVENOUS | Status: AC | PRN
Start: 2022-01-19 — End: 2022-01-19
  Administered 2022-01-19: 100 mL via INTRAVENOUS

## 2022-01-19 MED ORDER — MECLIZINE HCL 25 MG PO TABS: 12.5000 mg | ORAL_TABLET | Freq: Two times a day (BID) | ORAL | 3 refills | Status: DC | PRN

## 2022-01-19 NOTE — Telephone Encounter (Signed)
   Hi triage, I saw this patient in the office yesterday with c/o worsening dizziness, brain fog and she went to ER for acute evaluation. I just wanted her to have a follow-up call this morning to let her know I reviewed the ER workup and am very glad they evaluated her with MRI and also CT angiogram of her brain to rule out any acute issues. I do agree with following up with neurology for further evaluation. Since she was mildly orthostatic in the ER, would not increase her BP meds at this time. Can also offer a trial of meclizine '25mg'$  taking 1/2 tablet twice daily as needed for dizziness/vertigo symptoms. F/u as planned. Bring BP log to visit with BP checks at least 3 hours after AM carvedilol dose.  Thanks [employer] PA-C

## 2022-01-19 NOTE — ED Provider Notes (Signed)
I assumed care of this patient.  Please see previous provider note for further details of Hx, PE.  Briefly patient is a 76 y.o. female who presented dizziness pending CTA to assess posterior circulation. Was noted to be orthostatic and getting IVF.  CTA reassuring. After IVF, patient felt better.  The patient appears reasonably screened and/or stabilized for discharge and I doubt any other medical condition or other [facility] requiring further screening, evaluation, or treatment in the ED at this time prior to discharge. Safe for discharge with strict return precautions.  Disposition: Discharge  Condition: Good  I have discussed the results, Dx and Tx plan with the patient/family who expressed understanding and agree(s) with the plan. Discharge instructions discussed at length. The patient/family was given strict return precautions who verbalized understanding of the instructions. No further questions at time of discharge.    ED Discharge Orders     None         Follow Up: Pincus Sanes, MD [address] [place] [address] [phone]  Schedule an appointment as soon as possible for a visit in 2 days   [facility] [address] [place] [address] [phone] Schedule an appointment as soon as possible for a visit in 2 days           Toby Breithaupt, Amadeo Garnet, MD 01/19/22 0231

## 2022-01-19 NOTE — ED Notes (Signed)
RN reviewed discharge instructions with pt. Pt verbalized understanding and had no further questions. VSS upon discharge.  

## 2022-01-19 NOTE — Telephone Encounter (Signed)
Follow up call to patient this morning. Patient would like for Melina Copa, PA to recommend a neurologist. Patient states she trusts Dayna's recommendations.   Patient also wants the Meclizine script in case she needs it. This was sent to her pharmacy.   Patient verbalized understanding of instructions will bring a log of BP checks to her next visit.

## 2022-01-19 NOTE — Telephone Encounter (Signed)
I would suggest [facility] - have heard really good things from our patients about their team and they have been very helpful on mutual patients.

## 2022-01-19 NOTE — Telephone Encounter (Signed)
Left message with patient with recommendation from Melina Copa, [place]. Advised patient to call back with any questions.

## 2022-01-20 ENCOUNTER — Encounter: Payer: Self-pay | Admitting: Family Medicine

## 2022-01-20 ENCOUNTER — Encounter: Payer: Self-pay | Admitting: Internal Medicine

## 2022-01-20 DIAGNOSIS — M25571 Pain in right ankle and joints of right foot: Secondary | ICD-10-CM

## 2022-01-23 ENCOUNTER — Encounter: Payer: Self-pay | Admitting: Internal Medicine

## 2022-01-23 ENCOUNTER — Ambulatory Visit: Payer: Medicare Other | Admitting: Internal Medicine

## 2022-01-24 ENCOUNTER — Encounter: Payer: Self-pay | Admitting: Internal Medicine

## 2022-01-24 ENCOUNTER — Telehealth: Payer: Self-pay

## 2022-01-24 ENCOUNTER — Other Ambulatory Visit (INDEPENDENT_AMBULATORY_CARE_PROVIDER_SITE_OTHER): Payer: Medicare Other

## 2022-01-24 ENCOUNTER — Ambulatory Visit (INDEPENDENT_AMBULATORY_CARE_PROVIDER_SITE_OTHER): Payer: Medicare Other | Admitting: Internal Medicine

## 2022-01-24 VITALS — BP 118/64 | HR 61 | Ht 62.0 in | Wt 151.6 lb

## 2022-01-24 DIAGNOSIS — I2510 Atherosclerotic heart disease of native coronary artery without angina pectoris: Secondary | ICD-10-CM | POA: Diagnosis not present

## 2022-01-24 DIAGNOSIS — K589 Irritable bowel syndrome without diarrhea: Secondary | ICD-10-CM | POA: Diagnosis not present

## 2022-01-24 DIAGNOSIS — E538 Deficiency of other specified B group vitamins: Secondary | ICD-10-CM

## 2022-01-24 DIAGNOSIS — K219 Gastro-esophageal reflux disease without esophagitis: Secondary | ICD-10-CM | POA: Diagnosis not present

## 2022-01-24 LAB — VITAMIN B12: Vitamin B-12: 1239 pg/mL — ABNORMAL HIGH (ref 211–911)

## 2022-01-24 MED ORDER — DICYCLOMINE HCL 10 MG PO CAPS: ORAL_CAPSULE | ORAL | 1 refills | Status: DC

## 2022-01-24 NOTE — Progress Notes (Signed)
Subjective:    Patient ID: [name], female    DOB: Oct 18, 1945, 76 y.o.   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  She is here alone today and was last seen here in December 2022 by Mike Gip, PA-C.  She reports that in the last week or so she had 5 to 6 days of crampy lower abdominal pain.  This felt like gas pain but it also felt like spasm.  On 1 day, 2 days ago, it was significant and moderate to severe causing her to feel like she needed to double over.  During this.  Her bowel movements were daily and regular though on Monday she had looser stools for 1 day.  She took Creon which she had been off of 3 times yesterday.  Today she feels better without belly pain.  She did have a right buttock skin cyst versus subcutaneous abscess.  This was felt to be secondary to a possible bug bite and treated with antibiotics.  CT pelvis ruled out deep abscess or fistulous process.  She has stress which she is dealing with.  Her husband has Lewy body dementia and physical limitations requiring her assistance.  She reassures me she does feel safe at home.   Review of Systems As per HPI, otherwise negative  Current Medications, Allergies, Past Medical History, Past Surgical History, Family History and Social History were reviewed in [employer] record.    Objective:   Physical Exam BP 118/64   Pulse 61   Ht 5\' 2"  (1.575 m)   Wt 151 lb 9.6 oz (68.8 kg)   SpO2 97%   BMI 27.73 kg/m  Gen: awake, alert, NAD HEENT: anicteric  CV: RRR, no mrg Pulm: CTA b/l Abd: soft, NT/ND, +BS throughout Ext: no c/c/e Neuro: nonfocal  CT ABDOMEN AND PELVIS WITH CONTRAST   TECHNIQUE: Multidetector CT imaging of the abdomen and pelvis was performed using the  standard protocol following bolus administration of intravenous contrast.   CONTRAST:  OMNIPAQUE IOHEXOL 300 MG/ML SOLN, additional oral enteric contrast   COMPARISON:  11/27/2019   FINDINGS: Lower chest: No acute abnormality. Coronary artery calcifications. Small hiatal hernia.   Hepatobiliary: No solid liver abnormality is seen. Hepatic steatosis. No gallstones, gallbladder wall thickening, or biliary dilatation.   Pancreas: Unremarkable. No pancreatic ductal dilatation or surrounding inflammatory changes.   Spleen: Normal in size without significant abnormality.   Adrenals/Urinary Tract: Adrenal glands are unremarkable. Kidneys are normal, without renal calculi, solid lesion, or hydronephrosis. Pelvic floor prolapse with a cystocele (series 6, image 62). Bladder is otherwise unremarkable.   Stomach/Bowel: Stomach is within normal limits. Appendix appears normal. No evidence of bowel wall thickening, distention, or inflammatory changes. Sigmoid diverticula.   Vascular/Lymphatic: Scattered aortic atherosclerosis. No enlarged abdominal or pelvic lymph nodes.   Reproductive: Status post hysterectomy.   Other: No abdominal wall hernia or abnormality. No abdominopelvic ascites.   Musculoskeletal: No acute or significant osseous findings.   IMPRESSION: 1. No acute CT findings of the abdomen or pelvis to explain nausea, vomiting, or abdominal pain. 2. Hepatic steatosis. 3. Pelvic floor prolapse and cystocele. 4. Coronary artery disease.   Aortic Atherosclerosis (ICD10-I70.0).     Electronically Signed   By: Jearld Lesch M.D.   On: 07/14/2021  07:34    CT PELVIS WITH CONTRAST   TECHNIQUE: Multidetector CT imaging of the pelvis was performed using the standard protocol following the bolus administration of intravenous contrast.   RADIATION DOSE REDUCTION: This exam was performed according to the departmental dose-optimization program which includes  automated exposure control, adjustment of the mA and/or kV according to patient size and/or use of iterative reconstruction technique.   CONTRAST:  ISOVUE-300 IOPAMIDOL (ISOVUE-300) INJECTION 61%   COMPARISON:  07/13/2021.   FINDINGS: Urinary Tract:  Excreted contrast is present in the urinary bladder.   Bowel: No bowel obstruction. Normal appendix is seen in the right lower quadrant. Scattered diverticula are present along the colon without evidence of diverticulitis. No perianal fistula or abscess is seen. Examination is limited due to lack of rectal contrast.   Vascular/Lymphatic: No pathologically enlarged lymph nodes. No significant vascular abnormality seen.   Reproductive:  Status post hysterectomy.   Other:  Small fat containing umbilical hernia.  No ascites.   Musculoskeletal: Degenerative changes are present in the lumbar spine. No acute osseous abnormality. Increased density is noted in the subcutaneous tissues over the left buttock measuring 1.2 cm. No focal fluid collection is identified to suggest abscess.   IMPRESSION: 1. No evidence of perirectal fistula or abscess. 2. Increased density in the subcutaneous tissues over the left buttock measuring 1.2 cm, which may be infectious or inflammatory. 3. Diverticulosis without diverticulitis.     Electronically Signed   By: Thornell Sartorius M.D.   On: 01/19/2022 22:32       Latest Ref Rng & Units 01/18/2022    3:54 PM 10/21/2021   10:55 AM 07/08/2021    3:00 PM  CBC  WBC 4.0 - 10.5 K/uL 10.3  6.0  7.4   Hemoglobin 12.0 - 15.0 g/dL 63.8  75.6  43.3   Hematocrit 36.0 - 46.0 % 36.7  35.9  35.2   Platelets 150 - 400 K/uL 346  297  288.0    CMP     Component Value Date/Time   NA 140 01/18/2022 1554   NA 142 10/21/2021 1055   K 3.7 01/18/2022 1554   CL 105 01/18/2022 1554   CO2 27 01/18/2022 1554   GLUCOSE 101 (H) 01/18/2022 1554   GLUCOSE 99 07/23/2006 1101   BUN 18 01/18/2022 1554   BUN 17 10/21/2021  1055   CREATININE 0.91 01/18/2022 1554   CREATININE 1.09 (H) 03/20/2018 0900   CALCIUM 9.4 01/18/2022 1554   PROT 7.1 01/18/2022 1554   PROT 7.3 10/21/2021 1055   ALBUMIN 3.6 01/18/2022 1554   ALBUMIN 4.5 10/21/2021 1055   AST 26 01/18/2022 1554   ALT 30 01/18/2022 1554   ALKPHOS 101 01/18/2022 1554   BILITOT 0.5 01/18/2022 1554   BILITOT 0.4 10/21/2021 1055   GFRNONAA >60 01/18/2022 1554   GFRNONAA 50 (L) 03/20/2018 0900   GFRAA 66 08/02/2020 0947   GFRAA 58 (L) 03/20/2018 0900         Assessment & Plan:  76 year old female with a history of IBS, 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  She has been off of Creon which I think is reasonable.  The lower abdominal crampy pain is consistent with irritable bowel  and she had not use Bentyl because this medication expired.  Blood work and prior cross-sectional imaging reassuring.  No exam findings today to suggest diverticulitis. --Bentyl 10 mg 1 to 2 capsules 3 times daily as needed crampy abdominal pain/IBS --Creon 2 capsules with meals can be a backup only if needed for looser stools and crampy abdominal pain  2.  History of GERD and globus --symptoms currently well controlled continue omeprazole 40 mg daily  3.  History of borderline B12 --she is on oral B12, check B12 level today  4.  Subcutaneous furuncle buttock --resolving and no further drainage or pain.  Treated with antibiotics.  CT pelvis ruled out deeper pathology.  5.  Personal history of colon polyp and family history of colon cancer --surveillance colonoscopy July 2024  69-month follow-up per patient request, late September 2023  30 minutes total spent today  including patient facing time, coordination of care, reviewing medical history/procedures/pertinent radiology studies, and documentation of the encounter.

## 2022-01-26 ENCOUNTER — Telehealth: Payer: Self-pay | Admitting: Physician Assistant

## 2022-01-26 DIAGNOSIS — R768 Other specified abnormal immunological findings in serum: Secondary | ICD-10-CM | POA: Diagnosis not present

## 2022-01-26 DIAGNOSIS — G8929 Other chronic pain: Secondary | ICD-10-CM | POA: Diagnosis not present

## 2022-01-26 DIAGNOSIS — M159 Polyosteoarthritis, unspecified: Secondary | ICD-10-CM | POA: Diagnosis not present

## 2022-01-26 DIAGNOSIS — M1991 Primary osteoarthritis, unspecified site: Secondary | ICD-10-CM | POA: Diagnosis not present

## 2022-01-26 NOTE — Telephone Encounter (Signed)
Per Melina Copa PA, Great question. It does look like the CTA was able to image the carotid system adequately so OK to hold off duplex.   Called patient's daughter (DPR) and let her know patient does not need the carotid duplex at this time.

## 2022-01-26 NOTE — Telephone Encounter (Signed)
Patient states [name] was going to send her for a carotid duplex.  I did not see order for it.  Please enter order so patient can be scheduled.

## 2022-01-26 NOTE — Telephone Encounter (Signed)
Per Melina Copa PA's office visit note on 01/19/22     "1. Lightheadedness - unclear etiology, does not appear specifically related to cardiac status. She still feels this even while VSS and in NSR with HR 50s-60s. I am concerned about her complaints of brain fog and disequilibrium as well. She and her daughter had discussed yesterday about proceeding to ER when this was at its worst but she held off. Since I cannot easily explain this from anything we are seeing in the office today, given worsening symptoms with potential need for imaging, I've recommended they proceed to ER for further neurologic evaluation. She was also dealing with several other unrelated medical issues recently including an ankle effusion as well as a buttocks abscess so need to make sure nothing medical going on. We will arrange an outpatient carotid duplex (if not done in the hospital)."    Patient had CT angio of head and neck at hospital on 01/18/22. Will send to Melina Copa PA to see if we still need carotid duplex.

## 2022-01-30 DIAGNOSIS — M25572 Pain in left ankle and joints of left foot: Secondary | ICD-10-CM | POA: Diagnosis not present

## 2022-01-30 DIAGNOSIS — M25571 Pain in right ankle and joints of right foot: Secondary | ICD-10-CM | POA: Diagnosis not present

## 2022-02-01 ENCOUNTER — Other Ambulatory Visit: Payer: Self-pay | Admitting: Physician Assistant

## 2022-02-06 ENCOUNTER — Telehealth: Payer: Self-pay | Admitting: Physician Assistant

## 2022-02-06 MED ORDER — AMLODIPINE BESYLATE 5 MG PO TABS: 5.0000 mg | ORAL_TABLET | Freq: Every day | ORAL | 3 refills | Status: DC

## 2022-02-06 NOTE — Telephone Encounter (Signed)
Please increase amlodipine to '5mg'$  daily and keep followup as planned If she develops recurrent swelling on this dose, let us know and we will plan to add irbesartan Note to self: chose not to increase carvedilol as recent HR 58-61 during last eval

## 2022-02-06 NOTE — Telephone Encounter (Signed)
Pt c/o BP issue: STAT if pt c/o blurred vision, one-sided weakness or slurred speech  1. What are your last 5 BP readings?  162/85 - this morning 149/81 - yesterday 163/96 - yesterday 146/94 - yesterday 155/95 - yesterday 158/87 - 7/8  2. Are you having any other symptoms (ex. Dizziness, headache, blurred vision, passed out)? Light headed and occasional dizziness  3. What is your BP issue? Pt states that BP has been running high and she is scheduled to have eye surgery tomorrow. Pt would like a callback as soon as possible. Please advise

## 2022-02-06 NOTE — Telephone Encounter (Signed)
Pt aware.  Updated med list but did not send to pharmacy.  Pt will keep log and bring with her to her appt on Friday

## 2022-02-06 NOTE — Telephone Encounter (Signed)
Returned call to Pt.  She states her BP's have been increasing since her last OV.  She had cataract surgery scheduled tomorrow.  Her eye surgeon was concerned about recent BP's.  Pt states she will delay eye surgery.  Will plan to keep f/u as scheduled 02/10/2022.  Advised Pt would forward to DD for further advisment.

## 2022-02-08 NOTE — Progress Notes (Addendum)
Cardiology Office Note    Date:  02/10/2022   ID:  [name], DOB 1945/11/17, MRN [id number]  PCP:  [name] Rail, MD  Cardiologist:  Previously Dr. Meda Coffee but primarily follows with me at patient's request Electrophysiologist:  None   Chief Complaint: f/u HTN, recent dizziness  History of Present Illness:   [name] is a 76 y.o. female with history of moderate CAD by coronary CT, aortic atherosclerosis, mild-moderate AI by echo 06/2021, mildly dilated ascending aorta, HTN, PSVT, ankylosing spondylitis/HLA B27 positive, anxiety, osteoarthritis, Bell's palsy, diverticulosis, pancreatic insufficiency, HLD, HTN, interstitial cystitis, OA, pre-DM, OSA on CPAP (followed at [facility]), mild anemia who presents for f/u cardiology evaluation. [name] (who has a complex medical history and for whom she is primary caregiver) and daughter [name] are also followed by our clinic.   She previously followed with Dr. Meda Coffee for history of atypical sharp chest pain and nonobstructive CAD. Last ischemic assessment was by coronary CTA in 07/2020 with moderate stenosis in the prox and distal RCA, mild stenosis in the prox LAD with negative FFR, no abnormalities mentioned in aorta. Event monitor 2022 showed NSR with short runs of SVT, rare PVCs, PACs, average HR 75bpm, range 42-176 (low reading was during sleeping hours). At that time, palpitations had resolved without intervention. She previously followed with the lipid clinic in the past for cholesterol management but ultimately had been generally tolerating rosuvastatin. She has a history of mylagias/RLS-type neuropathy although it was not clear this was specifically related to the statin itself. LE arterial testing 2020 was normal. In a previous year she had reported lower extremity edema and facial edema. Amlodipine was changed to HCTZ but eventually switched back at a later time by primary care as she had muscle cramping -> in 06/2021 she wished to  revisit diuretic for trace edema so was trialed on spironolactone but felt poorly with this (felt like she was not able to void) so went back to amlodipine. Her rosuvastatin was also previously decreased to 36m daily due to muscle cramping. Last echo 06/2021 EF 60-65%, G1Dd, normal RV, mild-moderate AI and mild dilation of ascending aorta. [place], CK was slightly elevated and so statin was stopped with plan to recheck but this was not yet obtained. She also reported intermittent episodes of fleeting dizziness so event monitor was pursued showing predominantly NSR with average HR 75bpm, with 20 nonsustained SVT events (longest 14 beats), triggered events mostly correlated with NSR, occasionally with PACS/PVCs and 1 run of SVT - no sustained arrhythmias or pauses were noted. I had suggested initiation of carvedilol to address both SVT and elevated BP. At f/u 01/18/22 she reported continued dizziness/disequilibrium and new thought disturbance so was sent to ER for CVA r/o with reassuring workup including CTA head/neck. Meclizine was rx'd and recommended to f/u neurology. Per 02/06/22 phone note, amlodipine increased for continued elevated BP. She has also been dealing with ankle effusion and buttocks abscess. She has since seen a different rheumatologist back and was not felt to have any active issues but states bloodwork not pursued at that visit and she did not feel that the visit was complete.  She is seen back for follow-up here today with KBelenda [name] Main issue is elevated blood pressure for which she brings her log, still running 130s-150s. She has noticed some increase in LE edema and varicosities. Otherwise cardiac status appears to be stable. She did not yet trial the meclizine. She continues to report significant  stress helping to care for Joe and their financial affairs, as he previously handled most of their accounts.  Labwork independently reviewed: 12/2021 TSH wnl, ammonia, wnl, trops neg x2, INR  wnl, CBC wnl, K 3.7, Cr 0.91, LFTs OK, glu 101 09/2021 LDL 43  Cardiology Studies:   Studies reviewed are outlined and summarized above. Reports included below if pertinent.   CTA Head Neck 12/2021 CT HEAD FINDINGS   Brain: No evidence of acute infarction, hemorrhage, cerebral edema, mass, mass effect, or midline shift. No hydrocephalus or extra-axial fluid collection.   Vascular: No hyperdense vessel.   Skull: Normal. Negative for fracture or focal lesion.   Sinuses/Orbits: No acute finding.   Other: The mastoid air cells are well aerated.   CTA NECK FINDINGS   Aortic arch: 4 vessel arch, with the left vertebral artery originating from the aorta. Imaged portion shows no evidence of aneurysm or dissection. No significant stenosis of the major arch vessel origins.   Right carotid system: No evidence of dissection, occlusion, or hemodynamically significant stenosis (greater than 50%).   Left carotid system: No evidence of dissection, occlusion, or hemodynamically significant stenosis (greater than 50%).   Vertebral arteries: Codominant. No evidence of dissection, occlusion, or hemodynamically significant stenosis (greater than 50%).   Skeleton: No acute osseous abnormality. Degenerative changes in the cervical spine.   Other neck: No acute finding.   Upper chest: Volume loss in the right upper lobe, with vascular crowding. No focal pulmonary opacity or pleural effusion.   Review of the MIP images confirms the above findings   CTA HEAD FINDINGS   Anterior circulation: Both internal carotid arteries are patent to the termini, mild calcifications but without significant stenosis.   A1 segments patent, with a very hypoplastic right A1. Normal anterior communicating artery. Anterior cerebral arteries are patent to their distal aspects.   No M1 stenosis or occlusion. Normal MCA bifurcations. Distal MCA branches perfused and symmetric.   Posterior circulation:  Vertebral arteries patent to the vertebrobasilar junction without stenosis. Posterior inferior cerebellar arteries patent proximally.   Basilar patent to its distal aspect. Superior cerebellar arteries patent proximally.   Patent left P1. Fetal origin of the right PCA from a patent right posterior communicating artery. PCAs perfused to their distal aspects without stenosis. The possible diminutive left posterior communicating artery.   Venous sinuses: As permitted by contrast timing, patent.   Anatomic variants: Fetal origin of the right PCA.   Review of the MIP images confirms the above findings   IMPRESSION: 1.  No intracranial large vessel occlusion or significant stenosis. 2.  No hemodynamically significant stenosis in the neck. 3.  No acute intracranial process.     Electronically Signed   By: Merilyn Baba M.D.   On: 01/18/2022 23:32  Cor CTA 07/2020 FINDINGS: A 100 kV prospective scan was triggered in the descending thoracic aorta at 111 HU's. Axial non-contrast 3 mm slices were carried out through the heart. The data set was analyzed on a dedicated work station and scored using the [place]. Gantry rotation speed was 250 msecs and collimation was .6 mm. 50 mg of PO Metoprolol and 0.8 mg of sl NTG was given. The 3D data set was reconstructed in 5% intervals of the 67-82 % of the R-R cycle. Diastolic phases were analyzed on a dedicated work station using MPR, MIP and VRT modes. The patient received 80 cc of contrast.   Aorta: Normal size. Minimal atherosclerotic plaque and calcifications. No dissection.  Aortic Valve:  Trileaflet.  Trivial calcifications.   Coronary Arteries:  Normal coronary origin.  Right dominance.   RCA is a large dominant artery that gives rise to PDA and PLA. There is moderate diffuse calcified plaque in the proximal portion with stenosis 50-69%. Distal RCA has moderate mixed plaque with stenosis 50-69%. PLA and PDA are small with  minimal plaque.   Left main is a large artery that gives rise to LAD and LCX arteries. Left main is a large vessel with only luminal irregularities.   LAD is a large vessel that gives rise to one diagonal artery. There is mild predominantly calcified plaque in the proximal portion with stenosis 25-49%. Mid and distal LAD have only luminal irregularities.   D1 is small and has mild calcified focal stenosis in the proximal portion with stenosis 25-49%.   LCX is a non-dominant artery that gives rise to one large OM1 branch. There are only minimal irregularities.   Other findings:   Normal pulmonary vein drainage into the left atrium.   Normal left atrial appendage without a thrombus.   Normal size of the pulmonary artery.   IMPRESSION: 1. Coronary calcium score of 199. This was 95 percentile for age and sex matched control. In 2018 it was calcium score of 56 (54 percentile for age and sex matched control).   2. Normal coronary origin with right dominance.   3. CAD-RADS 3. Moderate stenosis in the proximal and distal RCA, mild stenosis in the proximal LAD. Consider symptom-guided anti-ischemic pharmacotherapy as well as risk factor modification per guideline directed care. Additional analysis with CT FFR will be submitted. There is progression of disease since the prior CCTA in 2018.   FINDINGS: FFRct analysis was performed on the original cardiac CT angiogram dataset. Diagrammatic representation of the FFRct analysis is provided in a separate PDF document in PACS. This dictation was created using the PDF document and an interactive 3D model of the results. 3D model is not available in the EMR/PACS. Normal FFR range is >0.80.   1. Left Main: 0.99.   2. LAD: Proximal: 0.98, mid: 0.94, distal: 0.90. 3. D1: 0.96. 4. LCX: 0.99. 5. OM1: 0.98 6. RCA: Proximal: 0.99, mid: 0.94, distal: 0.88.   IMPRESSION: 1. CT FFR analysis didn't show any significant stenosis.  Intensify medical management to slow down disease progression.     Electronically Signed   By: Ena Dawley   On: 08/26/2020 22:37   Monitor 07/2020 Patient had a min HR of 42 bpm, max HR of 176 bpm, and avg HR of 75 bpm 11 very short Supraventricular Tachycardia runs occurred, the run with the fastest interval lasting 4 beats with a max rate of 176 bpm, the longest lasting 10 beats with an avg rate of 114 bpm. Rare PACs and PVCs.   Predominant underlying rhythm was Sinus Rhythm. 11 very short Supraventricular Tachycardia runs occurred. the run with the fastest interval lasting 4 beats with a max rate of 176 bpm, the  longest lasting 10 beats with an avg rate of 114 bpm.    2D echo 06/2020    1. Left ventricular ejection fraction, by estimation, is 60 to 65%. The  left ventricle has normal function. The left ventricle has no regional  wall motion abnormalities. There is mild concentric left ventricular  hypertrophy. Left ventricular diastolic  parameters are consistent with Grade I diastolic dysfunction (impaired  relaxation).   2. Right ventricular systolic function is normal. The right ventricular  size is  normal.   3. The mitral valve is normal in structure. No evidence of mitral valve  regurgitation. No evidence of mitral stenosis.   4. The aortic valve is tricuspid. Aortic valve regurgitation is mild to  moderate. No aortic stenosis is present.   5. Aortic dilatation noted. There is mild dilatation of the ascending  aorta, measuring 38 mm.   6. The inferior vena cava is normal in size with greater than 50%  respiratory variability, suggesting right atrial pressure of 3 mmHg.   Comparison(s): No significant change from prior study. Prior images  reviewed side by side   2D Echo 06/30/21  1. Left ventricular ejection fraction, by estimation, is 60 to 65%. The  left ventricle has normal function. The left ventricle has no regional  wall motion abnormalities. There is mild  concentric left ventricular  hypertrophy. Left ventricular diastolic  parameters are consistent with Grade I diastolic dysfunction (impaired  relaxation).   2. Right ventricular systolic function is normal. The right ventricular  size is normal.   3. The mitral valve is normal in structure. Trivial mitral valve  regurgitation.   4. The aortic valve is tricuspid. There is mild calcification of the  aortic valve. There is mild thickening of the aortic valve. Aortic valve  regurgitation is mild to moderate. Aortic valve sclerosis is present, with  no evidence of aortic valve  stenosis.   5. There is mild dilatation of the ascending aorta, measuring 37 mm.   6. The inferior vena cava is normal in size with greater than 50%  respiratory variability, suggesting right atrial pressure of 3 mmHg.   Comparison(s): Compared to prior TTE in 06/2020, there is no significant  change. The AR remains mild-to-moderate and the ascending aorta size  remains stable in size (previously reported at 33mm).      Monitor 10/2021   Patient wear time was 14 days and 7 hours Predominant rhythm was NSR with average HR 75bpm A total of 20 nonsustained SVT episodes occurred with longest lasting 14 beats. Rare SVE and VE (<1%) Patient triggered events mainly correlated with NSR; occasionally with SVE, VE, and 1 run of SVT No sustained arrhythmias or significant pauses     Patch Wear Time:  14 days and 7 hours (2023-03-24T10:27:38-0400 to 2023-04-07T18:20:38-0400)   Monitor 1 Patient had a min HR of 44 bpm, max HR of 193 bpm, and avg HR of 75 bpm. Predominant underlying rhythm was Sinus Rhythm. 3 Supraventricular Tachycardia runs occurred, the run with the fastest interval lasting 7 beats with a max rate of 193 bpm (avg 172  bpm); the run with the fastest interval was also the longest. Isolated SVEs were rare (<1.0%), SVE Couplets were rare (<1.0%), and SVE Triplets were rare (<1.0%). Isolated VEs were rare  (<1.0%), and no VE Couplets or VE Triplets were present.    Monitor 2 Patient had a min HR of 41 bpm, max HR of 197 bpm, and avg HR of 75 bpm. Predominant underlying rhythm was Sinus Rhythm. 17 Supraventricular Tachycardia runs occurred, the run with the fastest interval lasting 5 beats with a max rate of 197 bpm, the  longest lasting 14 beats with an avg rate of 123 bpm. Supraventricular Tachycardia was detected within +/- 45 seconds of symptomatic patient event(s). Isolated SVEs were rare (<1.0%), SVE Couplets were rare (<1.0%), and SVE Triplets were rare (<1.0%).  Isolated VEs were rare (<1.0%), VE Couplets were rare (<1.0%), and no VE Triplets were present. Ventricular Bigeminy was  present.    Gwyndolyn Kaufman, MD    Past Medical History:  Diagnosis Date   Allergy    seasonal   Ankylosing spondylitis ([place])    Anxiety    Arthritis    Bell palsy    CAD (coronary artery disease)    a. Coronary CT 10/2016 showed calcium score in the 59th perecntile, moderate CAD in RCA/PDA and narrowing of the mid to distal LAD, with negative FFR.   CTS (carpal tunnel syndrome)    Cystitis    Diabetes mellitus without complication ([place])    Diverticulitis    Diverticulosis of colon    Dry eye syndrome    Exocrine pancreatic insufficiency    Frozen shoulder    GERD (gastroesophageal reflux disease)    HLA B27 (HLA B27 positive)    Hx of colonic polyps    Hyperlipidemia    NMR 2005   Hypertension    Hypothyroidism    Incontinence    Internal hemorrhoids    Interstitial cystitis    Kidney stone    Menopause    Mild aortic insufficiency    Mild ascending aorta dilatation (H[facility])    Osteoarthritis    Osteopenia    [facility] [address]   Pre-diabetes    PSVT (paroxysmal supraventricular tachycardia) ([place])    S/P total hysterectomy and bilateral salpingo-oophorectomy 04/12/2014   Sinusitis    Sleep apnea    c-pap   Subjective visual disturbance of both eyes 12/30/2013   Tubular  adenoma of colon    Vitamin D deficiency    Xerostomia    and Xeroophthalmia    Past Surgical History:  Procedure Laterality Date   BLADDER SURGERY     for incontinence   CARPAL TUNNEL RELEASE Left 12/22/2014   Procedure: LEFT CARPAL TUNNEL RELEASE;  Surgeon: Daryll Brod, MD;  Location: [place];  Service: Orthopedics;  Laterality: Left;   CARPAL TUNNEL RELEASE Right 02/18/2019   Procedure: RIGHT CARPAL TUNNEL RELEASE;  Surgeon: Daryll Brod, MD;  Location: [place];  Service: Orthopedics;  Laterality: Right;   COLONOSCOPY W/ POLYPECTOMY     adenomatous poyp 04-2008   colonscopy     tics 10-2002   CYSTOSCOPY WITH RETROGRADE PYELOGRAM, URETEROSCOPY AND STENT PLACEMENT Left 09/01/2013   Procedure: CYSTOSCOPY WITH RETROGRADE PYELOGRAM, AND LEFT STENT PLACEMENT;  Surgeon: Molli Hazard, MD;  Location: [facility];  Service: Urology;  Laterality: Left;   elevated LFTs     due to [place]   FINGER SURGERY Left 2004   2nd finger   G 1 P 1     interstitial cystitis     with clot in catheter   PARTIAL HYSTERECTOMY     with one ovary left   SHOULDER ARTHROSCOPY Left    TOTAL ABDOMINAL HYSTERECTOMY W/ BILATERAL SALPINGOOPHORECTOMY     dysfunctional menses    Current Medications: Current Meds  Medication Sig   acetaminophen (TYLENOL) 500 MG tablet Take 1,000 mg by mouth 3 (three) times daily as needed.    amLODipine (NORVASC) 5 MG tablet Take 1 tablet (5 mg total) by mouth daily.   Ascorbic Acid (VITAMIN C) 1000 MG tablet Take 1,000 mg by mouth 2 (two) times daily.    aspirin 81 MG tablet Take 81 mg by mouth daily.   carvedilol (COREG) 3.125 MG tablet TAKE 1 TABLET BY MOUTH 2 TIMES DAILY.   Cholecalciferol (VITAMIN D-3) 25 MCG (1000 UT) CAPS Take 10,000 Units by mouth  daily.    diclofenac Sodium (VOLTAREN) 1 % GEL Apply topically 4 (four) times daily as needed.    dicyclomine (BENTYL) 10 MG capsule Take 1 to 2 tablets three times daily as needed    EPINEPHrine (EPIPEN 2-PAK) 0.3 mg/0.3 mL IJ SOAJ injection Use as directed for severe allergic reactions   HYDROcodone-acetaminophen (NORCO/VICODIN) 5-325 MG tablet Take 1 tablet by mouth every 6 (six) hours as needed for moderate pain.   lipase/protease/amylase (CREON) 36000 UNITS CPEP capsule Take 2 capsules by mouth with each meal, 1 capsule by mouth with each snack   magnesium oxide (MAG-OX) 400 MG tablet Take 200 mg by mouth daily.   meclizine (ANTIVERT) 25 MG tablet Take 0.5 tablets (12.5 mg total) by mouth 2 (two) times daily as needed for dizziness.   Multiple Vitamins-Minerals (CENTRUM SILVER 50+WOMEN PO) Take 1 tablet by mouth daily.   naloxone (NARCAN) 4 MG/0.1ML LIQD nasal spray kit Narcan 4 mg/actuation nasal spray  PLACE 1 SPRAY INTO EACH NOSTRIL FOR SUSPECTED OPIOID OVERDOSE. MAY REPEAT 2 - 3 MINUTES WITH NEW BOTTLE IN OTHER NOSTRIL   Omega-3 Fatty Acids (FISH OIL BURP-LESS) 1000 MG CAPS Takes 1000 mg BID   omeprazole (PRILOSEC) 40 MG capsule Take 1 capsule (40 mg total) by mouth daily.   ondansetron (ZOFRAN) 4 MG tablet Take 1 tablet (4 mg total) by mouth every 8 (eight) hours as needed.   vitamin B-12 (CYANOCOBALAMIN) 1000 MCG tablet Take 1,000 mcg by mouth 2 (two) times daily.    vitamin E 400 UNIT capsule Take 400 Units by mouth daily.   zinc gluconate 50 MG tablet Take 50 mg by mouth daily.      Allergies:   Bactrim [sulfamethoxazole-trimethoprim], Nitroglycerin, Pentosan polysulfate, Pentosan polysulfate sodium, Promethazine hcl, Sulfamethoxazole-trimethoprim, Losartan, Promethazine hcl, and Lisinopril   Social History   Socioeconomic History   Marital status: Married    Spouse name: Not on file   Number of children: 1   Years of education: BS   Highest education level: Not on file  Occupational History   Occupation: retired   Occupation: Pharmacist, hospital    Comment: Kindergarten  Tobacco Use   Smoking status: Never   Smokeless tobacco: Never  Vaping Use   Vaping Use:  Never used  Substance and Sexual Activity   Alcohol use: Yes    Comment: social   Drug use: No   Sexual activity: Not Currently  Other Topics Concern   Not on file  Social History Narrative   Lives at home with husband.   No daily use of caffeine.   Right-handed.         Social Determinants of Health   Financial Resource Strain: Low Risk  (03/16/2021)   Overall Financial Resource Strain (CARDIA)    Difficulty of Paying Living Expenses: Not hard at all  Food Insecurity: No Food Insecurity (03/16/2021)   Hunger Vital Sign    Worried About Running Out of Food in the Last Year: Never true    Ran Out of Food in the Last Year: Never true  Transportation Needs: No Transportation Needs (03/16/2021)   PRAPARE - Hydrologist (Medical): No    Lack of Transportation (Non-Medical): No  Physical Activity: Sufficiently Active (03/16/2021)   Exercise Vital Sign    Days of Exercise per Week: 5 days    Minutes of Exercise per Session: 30 min  Stress: No Stress Concern Present (03/16/2021)   [place]  Questionnaire    Feeling of Stress : Not at all  Social Connections: Socially Integrated (03/16/2021)   Social Connection and Isolation Panel [NHANES]    Frequency of Communication with Friends and Family: More than three times a week    Frequency of Social Gatherings with Friends and Family: Once a week    Attends Religious Services: More than 4 times per year    Active Member of [employer] or Organizations: Yes    Attends Music therapist: More than 4 times per year    Marital Status: Married     Family History:  The patient's family history includes Allergic rhinitis in her sister and sister; Asthma in her mother; Breast cancer in her maternal aunt; Cirrhosis in her father; Colon cancer (age of onset: 34) in her sister; Coronary artery disease in her maternal grandfather and mother; Dementia in her mother;  Diabetes in her father, paternal aunt, and paternal grandmother; Food Allergy in her sister; Hypertension in her mother; Renal cancer in her paternal aunt; Stroke in her mother. There is no history of Esophageal cancer or Stomach cancer.  ROS:   Please see the history of present illness.  All other systems are reviewed and otherwise negative.    EKG(s)/Additional Labs   EKG:  EKG is not ordered today  Recent Labs: 07/26/2021: Magnesium 2.0 01/18/2022: ALT 30; BUN 18; Creatinine, Ser 0.91; Hemoglobin 12.1; Platelets 346; Potassium 3.7; Sodium 140; TSH 4.150  Recent Lipid Panel    Component Value Date/Time   CHOL 123 10/21/2021 1055   TRIG 107 10/21/2021 1055   TRIG 181 (H) 07/23/2006 1101   HDL 61 10/21/2021 1055   CHOLHDL 2.0 10/21/2021 1055   CHOLHDL 2 06/03/2021 1212   VLDL 24.2 06/03/2021 1212   LDLCALC 43 10/21/2021 1055   LDLDIRECT 114.0 05/02/2016 0919    PHYSICAL EXAM:    VS:  BP 136/84   Pulse 66   Ht 5' 2.5" (1.588 m)   Wt 154 lb (69.9 kg)   SpO2 98%   BMI 27.72 kg/m   BMI: Body mass index is 27.72 kg/m.  GEN: Well nourished, well developed female in no acute distress HEENT: normocephalic, atraumatic Neck: no JVD, carotid bruits, or masses Cardiac: RRR; no murmurs, rubs, or gallops, no edema  Respiratory:  clear to auscultation bilaterally, normal work of breathing GI: soft, nontender, nondistended, + BS MS: no deformity or atrophy Skin: warm and dry, no rash Neuro:  Alert and Oriented x 3, Strength and sensation are intact, follows commands Psych: euthymic mood, full affect  Wt Readings from Last 3 Encounters:  02/10/22 154 lb (69.9 kg)  01/24/22 151 lb 9.6 oz (68.8 kg)  01/18/22 156 lb (70.8 kg)     ASSESSMENT & PLAN:   1. Essential HTN - still not optimally controlled. Has been a moving target in the past. Cannot titrate carvedilol due to resting HR upper 50s-low 60s. Cannot titrate amlodipine due to edema. Previous issues with HCTZ,  spironolactone. Will trial chlorthalidone 12.5mg  daily with potassium 62meq daily with recheck BMET today and again in 1 week. Also discussed DASH diet.  2. Moderate CAD with HLD goal LDL <70, also h/o elevated CK level - has h/o atypical intermittent chest pains without recent acceleration. Continue ASA, amlodipine, carvedilol. Will recheck CK level and lipid panel and consider referral to lipid clinic for non-statin alternatives given the elevation recently.  3. PSVT - not specifically felt to be causing symptoms at this time. Continue beta blocker.  4. Dizziness - no obvious cardiac or vascular etiology at this time. Had previously recommended f/u with neuro.  5. Mild-moderate AI with mild dilation of ascending aorta - anticipate repeat echo 06/2022, can review scheduling in follow-up.     Disposition: F/u with me in 4-6 weeks per patient request.   Medication Adjustments/Labs and Tests Ordered: Current medicines are reviewed at length with the patient today.  Concerns regarding medicines are outlined above. Medication changes, Labs and Tests ordered today are summarized above and listed in the Patient Instructions accessible in Encounters.   Signed, Charlie Pitter, PA-C  02/10/2022 10:44 AM    [place] Phone: [phone]; Fax: [phone]

## 2022-02-10 ENCOUNTER — Encounter: Payer: Self-pay | Admitting: Physician Assistant

## 2022-02-10 ENCOUNTER — Ambulatory Visit (INDEPENDENT_AMBULATORY_CARE_PROVIDER_SITE_OTHER): Payer: Medicare Other | Admitting: Physician Assistant

## 2022-02-10 VITALS — BP 136/84 | HR 66 | Ht 62.5 in | Wt 154.0 lb

## 2022-02-10 DIAGNOSIS — I471 Supraventricular tachycardia, unspecified: Secondary | ICD-10-CM

## 2022-02-10 DIAGNOSIS — E785 Hyperlipidemia, unspecified: Secondary | ICD-10-CM

## 2022-02-10 DIAGNOSIS — R748 Abnormal levels of other serum enzymes: Secondary | ICD-10-CM | POA: Diagnosis not present

## 2022-02-10 DIAGNOSIS — R42 Dizziness and giddiness: Secondary | ICD-10-CM

## 2022-02-10 DIAGNOSIS — I10 Essential (primary) hypertension: Secondary | ICD-10-CM | POA: Diagnosis not present

## 2022-02-10 DIAGNOSIS — I77810 Thoracic aortic ectasia: Secondary | ICD-10-CM | POA: Diagnosis not present

## 2022-02-10 DIAGNOSIS — I2510 Atherosclerotic heart disease of native coronary artery without angina pectoris: Secondary | ICD-10-CM | POA: Diagnosis not present

## 2022-02-10 DIAGNOSIS — I351 Nonrheumatic aortic (valve) insufficiency: Secondary | ICD-10-CM

## 2022-02-10 MED ORDER — POTASSIUM CHLORIDE CRYS ER 20 MEQ PO TBCR: 20.0000 meq | EXTENDED_RELEASE_TABLET | Freq: Every day | ORAL | 1 refills | Status: DC

## 2022-02-10 MED ORDER — CHLORTHALIDONE 25 MG PO TABS: 12.5000 mg | ORAL_TABLET | Freq: Every day | ORAL | 1 refills | Status: DC

## 2022-02-10 MED ORDER — CHLORTHALIDONE 25 MG PO TABS: 25.0000 mg | ORAL_TABLET | Freq: Every day | ORAL | 1 refills | Status: DC

## 2022-02-10 NOTE — Patient Instructions (Addendum)
Medication Instructions:  START Chlorthalidone '25mg'$  Take half (12.'5mg'$ ) tablet once a day START Potassium 52mq Take 1 tablet once a day  *If you need a refill on your cardiac medications before your next appointment, please call your pharmacy*   Lab Work: TODAY-BMET, LIPIDS, CK  1 WEEK- BMET If you have labs (blood work) drawn today and your tests are completely normal, you will receive your results only by: [facility](if you have MyChart) OR A paper copy in the mail If you have any lab test that is abnormal or we need to change your treatment, we will call you to review the results.   Testing/Procedures: NONE ORDERED   Follow-Up: [facility] and your [facility] needs are our priority.  As part of our continuing mission to provide you with exceptional heart care, we have created designated Provider Care Teams.  These Care Teams include your primary Cardiologist (physician) and Advanced Practice Providers (APPs -  Physician Assistants and Nurse Practitioners) who all work together to provide you with the care you need, when you need it.  We recommend signing up for the patient portal called "MyChart".  Sign up information is provided on this After Visit Summary.  MyChart is used to connect with patients for Virtual Visits (Telemedicine).  Patients are able to view lab/test results, encounter notes, upcoming appointments, etc.  Non-urgent messages can be sent to your provider as well.   To learn more about what you can do with MyChart, go to [email or web address]    Your next appointment:   4-6 week(s)  The format for your next appointment:   In Person  Provider:   DMelina Copa PA-C        Other Instructions   Important Information About Sugar

## 2022-02-11 LAB — SPECIMEN STATUS

## 2022-02-13 LAB — BASIC METABOLIC PANEL
BUN/Creatinine Ratio: 13 (ref 12–28)
BUN: 11 mg/dL (ref 8–27)
CO2: 25 mmol/L (ref 20–29)
Calcium: 9.9 mg/dL (ref 8.7–10.3)
Chloride: 104 mmol/L (ref 96–106)
Creatinine, Ser: 0.84 mg/dL (ref 0.57–1.00)
Glucose: 101 mg/dL — ABNORMAL HIGH (ref 70–99)
Potassium: 4.1 mmol/L (ref 3.5–5.2)
Sodium: 142 mmol/L (ref 134–144)
eGFR: 71 mL/min/{1.73_m2} (ref >59)

## 2022-02-13 LAB — CK: Total CK: 167 U/L (ref 32–182)

## 2022-02-14 ENCOUNTER — Telehealth: Payer: Self-pay

## 2022-02-14 ENCOUNTER — Telehealth: Payer: Self-pay | Admitting: Physician Assistant

## 2022-02-14 DIAGNOSIS — E785 Hyperlipidemia, unspecified: Secondary | ICD-10-CM

## 2022-02-14 NOTE — Telephone Encounter (Signed)
Pt c/o medication issue:  1. Name of Medication:  chlorthalidone (HYGROTON) 25 MG tablet  2. How are you currently taking this medication (dosage and times per day)?   3. Are you having a reaction (difficulty breathing--STAT)?   4. What is your medication issue?   Patient states she received conflicting medication instructions and she would like to clarify how she should be taking it.

## 2022-02-14 NOTE — Telephone Encounter (Signed)
Followed up with pt. States Rx says to take 1/2 tablet (12.5 mg total) daily, and wants to to confirm this is correct. Pt advised to take 1/2 tablet daily as instructed. Patient verbalized understanding and agreeable to plan.

## 2022-02-14 NOTE — Telephone Encounter (Signed)
Placed order for lipid clinic. Patient aware of lab work results so far.

## 2022-02-14 NOTE — Telephone Encounter (Signed)
-----   Message from Charlie Pitter, [place] sent at 02/14/2022  8:09 AM EDT ----- Please let pt know lipid profile still pending, seems to have been lab issue, but OK to continue plan with BP medication like we discussed with chlorthalidone and potassium. Keep plan for f/u BMET as discussed. CK level HAS normalized off statin therapy so I would like to go ahead and get her set up to see pharmD lipid clinic to review alternatives to statin therapy for her CAD. The lipid panel will help guide the dosing, but is not a deal breaker for whether or not to refer at all.

## 2022-02-16 ENCOUNTER — Telehealth: Payer: Self-pay

## 2022-02-16 DIAGNOSIS — E785 Hyperlipidemia, unspecified: Secondary | ICD-10-CM

## 2022-02-16 LAB — LIPID PANEL

## 2022-02-16 LAB — SPECIMEN STATUS REPORT

## 2022-02-16 NOTE — Telephone Encounter (Signed)
-----   Message from Charlie Pitter, PA-C sent at 02/15/2022  7:36 AM EDT ----- Still does not look like lipid panel crossed over. (We also did not order CBC, so not sure why still says "will follow.") Please call lab to cancel these things and just get the lipid panel fasting when she returns for BMET on 7/21.

## 2022-02-16 NOTE — Telephone Encounter (Signed)
Will order new lab. Called [facility] and canceled lipid panel. Patient will come in fasting at her lab appointment on 7/28.

## 2022-02-17 ENCOUNTER — Other Ambulatory Visit: Payer: Medicare Other

## 2022-02-23 DIAGNOSIS — B351 Tinea unguium: Secondary | ICD-10-CM | POA: Diagnosis not present

## 2022-02-23 DIAGNOSIS — M79671 Pain in right foot: Secondary | ICD-10-CM | POA: Diagnosis not present

## 2022-02-23 DIAGNOSIS — M2042 Other hammer toe(s) (acquired), left foot: Secondary | ICD-10-CM | POA: Diagnosis not present

## 2022-02-23 DIAGNOSIS — M79672 Pain in left foot: Secondary | ICD-10-CM | POA: Diagnosis not present

## 2022-02-24 ENCOUNTER — Telehealth: Payer: Self-pay

## 2022-02-24 ENCOUNTER — Other Ambulatory Visit: Payer: Medicare Other

## 2022-02-24 DIAGNOSIS — I2510 Atherosclerotic heart disease of native coronary artery without angina pectoris: Secondary | ICD-10-CM

## 2022-02-24 DIAGNOSIS — I10 Essential (primary) hypertension: Secondary | ICD-10-CM

## 2022-02-24 DIAGNOSIS — E785 Hyperlipidemia, unspecified: Secondary | ICD-10-CM

## 2022-02-24 DIAGNOSIS — R748 Abnormal levels of other serum enzymes: Secondary | ICD-10-CM | POA: Diagnosis not present

## 2022-02-24 DIAGNOSIS — I471 Supraventricular tachycardia: Secondary | ICD-10-CM

## 2022-02-24 DIAGNOSIS — I77810 Thoracic aortic ectasia: Secondary | ICD-10-CM | POA: Diagnosis not present

## 2022-02-24 DIAGNOSIS — R42 Dizziness and giddiness: Secondary | ICD-10-CM

## 2022-02-24 DIAGNOSIS — I351 Nonrheumatic aortic (valve) insufficiency: Secondary | ICD-10-CM | POA: Diagnosis not present

## 2022-02-24 LAB — BASIC METABOLIC PANEL
BUN/Creatinine Ratio: 26 (ref 12–28)
BUN: 30 mg/dL — ABNORMAL HIGH (ref 8–27)
CO2: 24 mmol/L (ref 20–29)
Calcium: 9.8 mg/dL (ref 8.7–10.3)
Chloride: 101 mmol/L (ref 96–106)
Creatinine, Ser: 1.15 mg/dL — ABNORMAL HIGH (ref 0.57–1.00)
Glucose: 104 mg/dL — ABNORMAL HIGH (ref 70–99)
Potassium: 4.3 mmol/L (ref 3.5–5.2)
Sodium: 140 mmol/L (ref 134–144)
eGFR: 49 mL/min/{1.73_m2} — ABNORMAL LOW (ref >59)

## 2022-02-24 NOTE — Telephone Encounter (Signed)
Please let pt know labs show mild increase in kidney function. However, this is largely similar going back to 2020 in the trends. Would increase water intake over the weekend (I.e. max 64oz /day) and recheck on Monday or Tuesday  PER Dayna Dunn, PA-C    Pt called and made aware of BMET lab results as stated above in detail.      Pt advised to increase hydration / water intake over the weekend, and agreed to have her BMET rechecked / reassessed by Melina Copa, PA-C.    Pt chose to come into [facility] [address] to have her BMET drawn Monday morning July 31;  Order placed in [place], and Ms. Dunn, PA-C will be made aware of the re-draw.

## 2022-02-27 ENCOUNTER — Other Ambulatory Visit: Payer: Medicare Other

## 2022-02-27 DIAGNOSIS — I2510 Atherosclerotic heart disease of native coronary artery without angina pectoris: Secondary | ICD-10-CM

## 2022-02-27 DIAGNOSIS — R748 Abnormal levels of other serum enzymes: Secondary | ICD-10-CM

## 2022-02-27 DIAGNOSIS — I10 Essential (primary) hypertension: Secondary | ICD-10-CM

## 2022-02-27 DIAGNOSIS — E785 Hyperlipidemia, unspecified: Secondary | ICD-10-CM

## 2022-02-27 LAB — BASIC METABOLIC PANEL
BUN/Creatinine Ratio: 20 (ref 12–28)
BUN: 22 mg/dL (ref 8–27)
CO2: 25 mmol/L (ref 20–29)
Calcium: 10.3 mg/dL (ref 8.7–10.3)
Chloride: 101 mmol/L (ref 96–106)
Creatinine, Ser: 1.08 mg/dL — ABNORMAL HIGH (ref 0.57–1.00)
Glucose: 118 mg/dL — ABNORMAL HIGH (ref 70–99)
Potassium: 4 mmol/L (ref 3.5–5.2)
Sodium: 138 mmol/L (ref 134–144)
eGFR: 53 mL/min/{1.73_m2} — ABNORMAL LOW (ref >59)

## 2022-02-27 LAB — LIPID PANEL
Chol/HDL Ratio: 4.1 ratio (ref 0.0–4.4)
Cholesterol, Total: 202 mg/dL — ABNORMAL HIGH (ref 100–199)
HDL: 49 mg/dL (ref >39)
LDL Chol Calc (NIH): 120 mg/dL — ABNORMAL HIGH (ref 0–99)
Triglycerides: 188 mg/dL — ABNORMAL HIGH (ref 0–149)
VLDL Cholesterol Cal: 33 mg/dL (ref 5–40)

## 2022-02-28 ENCOUNTER — Telehealth: Payer: Self-pay

## 2022-02-28 NOTE — Telephone Encounter (Signed)
-----   Message from Charlie Pitter, [place] sent at 02/28/2022  8:36 AM EDT ----- Please let pt know labs improved with hydration. Cr looks similar to prior baseline back to many years ago as well so this still looks to be within her baseline. Find out how BP is running. Not surprisingly, cholesterol is back up with the statin holiday so agree with plan for lipid clinic like we talked about.

## 2022-02-28 NOTE — Telephone Encounter (Signed)
Attempted phone call to pt and left voicemail message to contact triage at [phone] re: lab results.

## 2022-03-02 ENCOUNTER — Ambulatory Visit (INDEPENDENT_AMBULATORY_CARE_PROVIDER_SITE_OTHER): Payer: Medicare Other | Admitting: Diagnostic Neuroimaging

## 2022-03-02 ENCOUNTER — Encounter: Payer: Self-pay | Admitting: Diagnostic Neuroimaging

## 2022-03-02 VITALS — BP 120/72 | HR 70 | Ht 62.0 in | Wt 153.0 lb

## 2022-03-02 DIAGNOSIS — R42 Dizziness and giddiness: Secondary | ICD-10-CM | POA: Diagnosis not present

## 2022-03-02 DIAGNOSIS — R799 Abnormal finding of blood chemistry, unspecified: Secondary | ICD-10-CM | POA: Diagnosis not present

## 2022-03-02 DIAGNOSIS — I2510 Atherosclerotic heart disease of native coronary artery without angina pectoris: Secondary | ICD-10-CM | POA: Diagnosis not present

## 2022-03-02 NOTE — Patient Instructions (Signed)
-   no clear triggers; more with standing than sitting - symptom log x 4 weeks - monitor BP at home - check A1c

## 2022-03-02 NOTE — Progress Notes (Signed)
[facility]  PATIENT: [name] DOB: 01/28/1946  REFERRING CLINICIAN: Binnie Rail, MD HISTORY FROM: patient  REASON FOR VISIT: new consult    HISTORICAL  CHIEF COMPLAINT:  Chief Complaint  Patient presents with   Dizziness    Rm 7 New Pt ED ref, TOC Dr Jannifer Franklin  "6 weeks of dizziness/lightheadedness;  saw cardiologist, stroke ruled out; hard to get my thoughts together"     HISTORY OF PRESENT ILLNESS:   76 year old female here for evaluation of intermittent dizziness and lightheadedness.  01/17/2022 patient had onset of intermittent lightheadedness and dizziness.  Symptoms can last 1 to 2 minutes at a time, occurring almost on daily basis, once or twice a day.  Symptoms tend to affect her more when she is standing up and sitting down.  No other specific triggering aggravating factors.  No specific time of day, activity or blood pressure correlation.  Patient did get emergency room evaluation, had MRI, CTA, which were unremarkable.  Was having some slightly higher blood pressure at the time and blood pressure medications adjusted, now running closer to normotensive.  Of note patient has been under significant caregiver stress for the past 10 years, with her husband who has possible dementia with Lewy body.  Patient denies any visual loss, facial numbness, numbness tingling, gait or balance difficulties.    REVIEW OF SYSTEMS: Full 14 system review of systems performed and negative with exception of: as per HPI.  ALLERGIES: Allergies  Allergen Reactions   Bactrim [Sulfamethoxazole-Trimethoprim] Other (See Comments)   Nitroglycerin Anaphylaxis    Hypotensive after NTG administration in ER during chest pain evaluation Pt states she almost died,  Pt states in a procedure done in 10/2016 she had no reaction to this medication, so not sure if it is an actual allergy.    Pentosan Polysulfate Other (See Comments)    Elevated liver count Elevated liver  enzymes Elevated LFTs.......Marland Kitchen elmiron    Pentosan Polysulfate Sodium     Elevated LFTs.......Marland Kitchen elmiron    Promethazine Hcl Other (See Comments)    "jittery on the inside"   Sulfamethoxazole-Trimethoprim Nausea Only    Unknown reaction per pt, Pt thinks she may have felt sick to her stomach.    Losartan Other (See Comments)    Pt states caused insomnia, tingling sensation in legs, lightheaded, stomach pain   Promethazine Hcl    Lisinopril Cough    HOME MEDICATIONS: Outpatient Medications Prior to Visit  Medication Sig Dispense Refill   acetaminophen (TYLENOL) 500 MG tablet Take 1,000 mg by mouth 3 (three) times daily as needed.      amLODipine (NORVASC) 5 MG tablet Take 1 tablet (5 mg total) by mouth daily. 90 tablet 3   Ascorbic Acid (VITAMIN C) 1000 MG tablet Take 1,000 mg by mouth 2 (two) times daily.      aspirin 81 MG tablet Take 81 mg by mouth daily.     carvedilol (COREG) 3.125 MG tablet TAKE 1 TABLET BY MOUTH 2 TIMES DAILY. 60 tablet 1   chlorthalidone (HYGROTON) 25 MG tablet Take 0.5 tablets (12.5 mg total) by mouth daily. 30 tablet 1   Cholecalciferol (VITAMIN D-3) 25 MCG (1000 UT) CAPS Take 10,000 Units by mouth daily.      diclofenac Sodium (VOLTAREN) 1 % GEL Apply topically 4 (four) times daily as needed.      dicyclomine (BENTYL) 10 MG capsule Take 1 to 2 tablets three times daily as needed 90 capsule 1   EPINEPHrine (  EPIPEN 2-PAK) 0.3 mg/0.3 mL IJ SOAJ injection Use as directed for severe allergic reactions 2 each 1   HYDROcodone-acetaminophen (NORCO/VICODIN) 5-325 MG tablet Take 1 tablet by mouth every 6 (six) hours as needed for moderate pain. 30 tablet 0   lipase/protease/amylase (CREON) 36000 UNITS CPEP capsule Take 2 capsules by mouth with each meal, 1 capsule by mouth with each snack 720 capsule 1   magnesium oxide (MAG-OX) 400 MG tablet Take 200 mg by mouth daily.     meclizine (ANTIVERT) 25 MG tablet Take 0.5 tablets (12.5 mg total) by mouth 2 (two) times daily as  needed for dizziness. 30 tablet 3   Multiple Vitamins-Minerals (CENTRUM SILVER 50+WOMEN PO) Take 1 tablet by mouth daily.     naloxone (NARCAN) 4 MG/0.1ML LIQD nasal spray kit Narcan 4 mg/actuation nasal spray  PLACE 1 SPRAY INTO EACH NOSTRIL FOR SUSPECTED OPIOID OVERDOSE. MAY REPEAT 2 - 3 MINUTES WITH NEW BOTTLE IN OTHER NOSTRIL     Omega-3 Fatty Acids (FISH OIL BURP-LESS) 1000 MG CAPS Takes 1000 mg BID 60 capsule 0   omeprazole (PRILOSEC) 40 MG capsule Take 1 capsule (40 mg total) by mouth daily. 90 capsule 4   ondansetron (ZOFRAN) 4 MG tablet Take 1 tablet (4 mg total) by mouth every 8 (eight) hours as needed. 30 tablet 0   potassium chloride SA (KLOR-CON M20) 20 MEQ tablet Take 1 tablet (20 mEq total) by mouth daily. 30 tablet 1   vitamin B-12 (CYANOCOBALAMIN) 1000 MCG tablet Take 1,000 mcg by mouth 2 (two) times daily.      vitamin E 400 UNIT capsule Take 400 Units by mouth daily.     zinc gluconate 50 MG tablet Take 50 mg by mouth daily.     No facility-administered medications prior to visit.    PAST MEDICAL HISTORY: Past Medical History:  Diagnosis Date   Allergy    seasonal   Ankylosing spondylitis (HCC)    Anxiety    Arthritis    Bell palsy    CAD (coronary artery disease)    a. Coronary CT 10/2016 showed calcium score in the 59th perecntile, moderate CAD in RCA/PDA and narrowing of the mid to distal LAD, with negative FFR.   CTS (carpal tunnel syndrome)    Cystitis    Diabetes mellitus without complication ([place])    Diverticulitis    Diverticulosis of colon    Dry eye syndrome    Exocrine pancreatic insufficiency    Frozen shoulder    GERD (gastroesophageal reflux disease)    HLA B27 (HLA B27 positive)    Hx of colonic polyps    Hyperlipidemia    NMR 2005   Hypertension    Hypothyroidism    Incontinence    Internal hemorrhoids    Interstitial cystitis    Kidney stone    Menopause    Mild aortic insufficiency    Mild ascending aorta dilatation (HCC)     Osteoarthritis    Osteopenia    [facility] [address]   Pre-diabetes    PSVT (paroxysmal supraventricular tachycardia) ([place])    S/P total hysterectomy and bilateral salpingo-oophorectomy 04/12/2014   Sinusitis    Sleep apnea    c-pap   Subjective visual disturbance of both eyes 12/30/2013   Tubular adenoma of colon    Vitamin D deficiency    Xerostomia    and Xeroophthalmia    PAST SURGICAL HISTORY: Past Surgical History:  Procedure Laterality Date   BLADDER SURGERY  for incontinence   CARPAL TUNNEL RELEASE Left 12/22/2014   Procedure: LEFT CARPAL TUNNEL RELEASE;  Surgeon: Daryll Brod, MD;  Location: [place];  Service: Orthopedics;  Laterality: Left;   CARPAL TUNNEL RELEASE Right 02/18/2019   Procedure: RIGHT CARPAL TUNNEL RELEASE;  Surgeon: Daryll Brod, MD;  Location: [place];  Service: Orthopedics;  Laterality: Right;   cataract excision Right 2022   COLONOSCOPY W/ POLYPECTOMY     adenomatous poyp 04-2008   colonscopy     tics 10-2002   CYSTOSCOPY WITH RETROGRADE PYELOGRAM, URETEROSCOPY AND STENT PLACEMENT Left 09/01/2013   Procedure: CYSTOSCOPY WITH RETROGRADE PYELOGRAM, AND LEFT STENT PLACEMENT;  Surgeon: Molli Hazard, MD;  Location: [facility];  Service: Urology;  Laterality: Left;   elevated LFTs     due to Elmiron   FINGER SURGERY Left 2004   2nd finger   G 1 P 1     interstitial cystitis     with clot in catheter   PARTIAL HYSTERECTOMY     with one ovary left   SHOULDER ARTHROSCOPY Left    TOTAL ABDOMINAL HYSTERECTOMY W/ BILATERAL SALPINGOOPHORECTOMY     dysfunctional menses    FAMILY HISTORY: Family History  Problem Relation Age of Onset   Coronary artery disease Mother    Hypertension Mother    Asthma Mother    Dementia Mother    Stroke Mother        mini cva   Diabetes Father    Cirrhosis Father        non alcoholic   Colon cancer Sister 56   Allergic rhinitis Sister    Allergic rhinitis Sister     Food Allergy Sister    Renal cancer Paternal Aunt        x 2   Coronary artery disease Maternal Grandfather    Breast cancer Maternal Aunt    Diabetes Paternal Aunt    Diabetes Paternal Grandmother    Esophageal cancer Neg Hx    Stomach cancer Neg Hx     SOCIAL HISTORY: Social History   Socioeconomic History   Marital status: Married    Spouse name: Not on file   Number of children: 1   Years of education: BS   Highest education level: Not on file  Occupational History   Occupation: retired   Occupation: Pharmacist, hospital    Comment: Kindergarten  Tobacco Use   Smoking status: Never   Smokeless tobacco: Never  Scientific laboratory technician Use: Never used  Substance and Sexual Activity   Alcohol use: Yes    Comment: social   Drug use: No   Sexual activity: Not Currently  Other Topics Concern   Not on file  Social History Narrative   Lives at home with husband, is his caregiver   No daily use of caffeine.   Right-handed.         Social Determinants of Health   Financial Resource Strain: Low Risk  (03/16/2021)   Overall Financial Resource Strain (CARDIA)    Difficulty of Paying Living Expenses: Not hard at all  Food Insecurity: No Food Insecurity (03/16/2021)   Hunger Vital Sign    Worried About Running Out of Food in the Last Year: Never true    Ran Out of Food in the Last Year: Never true  Transportation Needs: No Transportation Needs (03/16/2021)   PRAPARE - Hydrologist (Medical): No    Lack of Transportation (Non-Medical): No  Physical Activity: Sufficiently Active (03/16/2021)   Exercise Vital Sign    Days of Exercise per Week: 5 days    Minutes of Exercise per Session: 30 min  Stress: No Stress Concern Present (03/16/2021)   [place]    Feeling of Stress : Not at all  Social Connections: [place] (03/16/2021)   Social Connection and Isolation Panel [NHANES]     Frequency of Communication with Friends and Family: More than three times a week    Frequency of Social Gatherings with Friends and Family: Once a week    Attends Religious Services: More than 4 times per year    Active Member of [employer] or Organizations: Yes    Attends Music therapist: More than 4 times per year    Marital Status: Married  Human resources officer Violence: Not At Risk (03/16/2021)   Humiliation, Afraid, Rape, and Kick questionnaire    Fear of Current or Ex-Partner: No    Emotionally Abused: No    Physically Abused: No    Sexually Abused: No     PHYSICAL EXAM  GENERAL EXAM/CONSTITUTIONAL: Vitals:  Vitals:   03/02/22 1455  BP: 120/72  Pulse: 70  Weight: 153 lb (69.4 kg)  Height: 5' 2"  (1.575 m)   Body mass index is 27.98 kg/m. Wt Readings from Last 3 Encounters:  03/02/22 153 lb (69.4 kg)  02/10/22 154 lb (69.9 kg)  01/24/22 151 lb 9.6 oz (68.8 kg)   Patient is in no distress; well developed, nourished and groomed; neck is supple  CARDIOVASCULAR: Examination of carotid arteries is normal; no carotid bruits Regular rate and rhythm, no murmurs Examination of peripheral vascular system by observation and palpation is normal  EYES: Ophthalmoscopic exam of optic discs and posterior segments is normal; no papilledema or hemorrhages No results found.  MUSCULOSKELETAL: Gait, strength, tone, movements noted in Neurologic exam below  NEUROLOGIC: MENTAL STATUS:      No data to display         awake, alert, oriented to person, place and time recent and remote memory intact normal attention and concentration language fluent, comprehension intact, naming intact fund of knowledge appropriate  CRANIAL NERVE:  2nd - no papilledema on fundoscopic exam 2nd, 3rd, 4th, 6th - pupils equal and reactive to light, visual fields full to confrontation, extraocular muscles intact, no nystagmus 5th - facial sensation symmetric 7th - facial strength  symmetric 8th - hearing intact 9th - palate elevates symmetrically, uvula midline 11th - shoulder shrug symmetric 12th - tongue protrusion midline  MOTOR:  normal bulk and tone, full strength in the BUE, BLE  SENSORY:  normal and symmetric to light touch, temperature, vibration  COORDINATION:  finger-nose-finger, fine finger movements normal  REFLEXES:  deep tendon reflexes TRACE and symmetric  GAIT/STATION:  narrow based gait; romberg is negative     DIAGNOSTIC DATA (LABS, IMAGING, TESTING) - I reviewed patient records, labs, notes, testing and imaging myself where available.  Lab Results  Component Value Date   WBC WILL FOLLOW 02/10/2022   HGB WILL FOLLOW 02/10/2022   HCT WILL FOLLOW 02/10/2022   MCV WILL FOLLOW 02/10/2022   PLT WILL FOLLOW 02/10/2022      Component Value Date/Time   NA 138 02/27/2022 0757   K 4.0 02/27/2022 0757   CL 101 02/27/2022 0757   CO2 25 02/27/2022 0757   GLUCOSE 118 (H) 02/27/2022 0757   GLUCOSE 101 (H) 01/18/2022 1554   GLUCOSE 99  07/23/2006 1101   BUN 22 02/27/2022 0757   CREATININE 1.08 (H) 02/27/2022 0757   CREATININE 1.09 (H) 03/20/2018 0900   CALCIUM 10.3 02/27/2022 0757   PROT 7.1 01/18/2022 1554   PROT 7.3 10/21/2021 1055   ALBUMIN 3.6 01/18/2022 1554   ALBUMIN 4.5 10/21/2021 1055   AST 26 01/18/2022 1554   ALT 30 01/18/2022 1554   ALKPHOS 101 01/18/2022 1554   BILITOT 0.5 01/18/2022 1554   BILITOT 0.4 10/21/2021 1055   GFRNONAA >60 01/18/2022 1554   GFRNONAA 50 (L) 03/20/2018 0900   GFRAA 66 08/02/2020 0947   GFRAA 58 (L) 03/20/2018 0900   Lab Results  Component Value Date   CHOL 202 (H) 02/27/2022   HDL 49 02/27/2022   LDLCALC 120 (H) 02/27/2022   LDLDIRECT 114.0 05/02/2016   TRIG 188 (H) 02/27/2022   CHOLHDL 4.1 02/27/2022   Lab Results  Component Value Date   HGBA1C 6.3 06/03/2021   Lab Results  Component Value Date   VITAMINB12 1,239 (H) 01/24/2022   Lab Results  Component Value Date   TSH 4.150  01/18/2022    01/18/22 MRI brain [I reviewed images myself and agree with interpretation. -VRP]  1. No acute intracranial pathology. 2. Background mild chronic white matter microangiopathy.  01/18/22 CTA head / neck 1.  No intracranial large vessel occlusion or significant stenosis. 2.  No hemodynamically significant stenosis in the neck. 3.  No acute intracranial process.  06/30/21 TTE  1. Left ventricular ejection fraction, by estimation, is 60 to 65%. The  left ventricle has normal function. The left ventricle has no regional  wall motion abnormalities. There is mild concentric left ventricular  hypertrophy. Left ventricular diastolic  parameters are consistent with Grade I diastolic dysfunction (impaired  relaxation).   2. Right ventricular systolic function is normal. The right ventricular  size is normal.   3. The mitral valve is normal in structure. Trivial mitral valve  regurgitation.   4. The aortic valve is tricuspid. There is mild calcification of the  aortic valve. There is mild thickening of the aortic valve. Aortic valve  regurgitation is mild to moderate. Aortic valve sclerosis is present, with  no evidence of aortic valve  stenosis.   5. There is mild dilatation of the ascending aorta, measuring 37 mm.   6. The inferior vena cava is normal in size with greater than 50%  respiratory variability, suggesting right atrial pressure of 3 mmHg.   Comparison(s): Compared to prior TTE in 06/2020, there is no significant  change. The AR remains mild-to-moderate and the ascending aorta size  remains stable in size (previously reported at 70m).      ASSESSMENT AND PLAN  76y.o. year old female here with:    Dx:  1. Dizziness   2. Abnormal finding of blood chemistry, unspecified      PLAN:  INTERMITTENT LIGHTHEADEDNESS (neuro exam normal; MRI brain and CTA head / neck unremarkable) - no clear triggers; more with standing than sitting - symptom log x 4 weeks -  monitor BP at home - med risk factor mgmt for stroke prevention (aspirin, BP, lipids, OSA) - recheck A1c  Orders Placed This Encounter  Procedures   Hemoglobin A1c   Return for pending if symptoms worsen or fail to improve, pending test results.    VPenni Bombard MD 86/02/320 42:24PM Certified in Neurology, Neurophysiology and Neuroimaging  [facility] [address] [place] [phone]

## 2022-03-03 ENCOUNTER — Telehealth: Payer: Self-pay | Admitting: Cardiology

## 2022-03-03 LAB — HEMOGLOBIN A1C
Est. average glucose Bld gHb Est-mCnc: 137 mg/dL
Hgb A1c MFr Bld: 6.4 % — ABNORMAL HIGH (ref 4.8–5.6)

## 2022-03-03 NOTE — Telephone Encounter (Signed)
Patient was returning call. Please advise ?

## 2022-03-03 NOTE — Telephone Encounter (Signed)
Lab results have been addressed in telephone encounter dated 03/03/2022.  See that encounter for complete details.

## 2022-03-03 NOTE — Telephone Encounter (Signed)
Spoke with pt and advised of lab results and recommendations below per Melina Copa, PA-C.  Pt states BP has been in the 120's-130's/70's-80's.  She plans to keep her appointment with Lipid clinic scheduled for 03/10/2022.  Pt thanked Therapist, sports for the call.      Idolina Primer, PA-C sent at 02/28/2022  8:36 AM EDT ----- Please let pt know labs improved with hydration. Cr looks similar to prior baseline back to many years ago as well so this still looks to be within her baseline. Find out how BP is running. Not surprisingly, cholesterol is back up with the statin holiday so agree with plan for lipid clinic like we talked about.

## 2022-03-07 DIAGNOSIS — H2512 Age-related nuclear cataract, left eye: Secondary | ICD-10-CM | POA: Diagnosis not present

## 2022-03-07 DIAGNOSIS — S0512XA Contusion of eyeball and orbital tissues, left eye, initial encounter: Secondary | ICD-10-CM | POA: Diagnosis not present

## 2022-03-07 DIAGNOSIS — H25042 Posterior subcapsular polar age-related cataract, left eye: Secondary | ICD-10-CM | POA: Diagnosis not present

## 2022-03-07 DIAGNOSIS — H25812 Combined forms of age-related cataract, left eye: Secondary | ICD-10-CM | POA: Diagnosis not present

## 2022-03-07 DIAGNOSIS — H269 Unspecified cataract: Secondary | ICD-10-CM | POA: Diagnosis not present

## 2022-03-09 NOTE — Progress Notes (Unsigned)
Patient ID: [name]                 DOB: April 06, 1946                    MRN: [id number]     HPI: [name] is a 76 y.o. female patient of Dr [name] referred to lipid clinic by [name] Copa, PA-C. PMH is significant for CAD, aortic atherosclerosis, mild-moderate aortic valve regurgitation by ECHO 12/22, mildly dilated ascending aorta, HTN, PSVT, ankylosing spondylitis, anxiety, OA, Bell's palsy, diverticulosis, pancreatis insufficiency,OSA on CPAP, T2DM (no meds), and anemia. Coronary CT 08/26/20 showed moderate stenosis in the proximal and distal RCA, mild stenosis in the proximal LAD, with calcium score of 199 and in 70th percentile (score 56 with 59th percentile in 2018). Event monitor in 2022 showed NSR with short runs of SVT, rare PVCs, and PACs.   Pt was previously seen in [facility] lipid clinic 08/31/20 and for BP medication management in the past. Pt reported intolerances to higher doses of statins due to myalgias. At this time patient was taking rosuvastatin 5 mg 3x per week and reported "creepy crawly feeling in legs" that occurred intermittently. At that visit additional lipid lowering options were discussed including PSCK9 inhibitors and ezetimibe, but patient agreed to restart rosuvastatin at 5 mg daily as she was not sure that leg sensations were due to statin. Follow up labs on 10/25/20 showed LDL of 59, TG 152, and patient reported no side effects with rosuvastatin 5 mg at this time. Rosuvastatin was increased to 20 mg daily but on 07/11/21 decreased to 10 mg daily as LDL was 23 and pt was experiencing muscle cramping.   In 03/23 visit with [name] it was noted that patient had mild CK elevation and though cramping was improved from the dose decrease, rosuvastatin was stopped at that time. After stopping rosuvastatin, CK normalized back to 167 and pt was referred to lipid clinic to evaluate other alternatives.   Pt presents to lipid clinic today with her daughter. Patient shares she mainly  drinks half and half tea as well as soda and not as much water. Patient asked about how she can eat healthier. Also informed patient that omega-3 supplements are not as effective at managing lipids and are not as regulated but would not necessarily be harmful to take.   Current Medications: omega-3 fatty acids 1000 mg BID  Intolerances: rosuvastatin 5 mg, 10 mg, 20 mg (leg cramping, CK elevation 245 on 10/21/21) Risk Factors: CAC 199 (70th percentile), CAD, aortic atherosclerosis, HTN, T2DM LDL goal: <70  Diet:  Nonsweet or half and half tea, drinks soda  Family History: CAD in her maternal grandfather and mother; Diabetes in her father, paternal aunt, and paternal grandmother; Hypertension in her mother; Stroke in her mother.   Social History: denies smoking, social drinker  Labs: 03/02/22 A1C - 6.4%  02/27/22 lipid panel - LDL 120, TG 188, HDL 49, TC 202 (omega-3 fatty acids) 02/10/22 - CK 167  10/21/21- CK 245 10/25/20 lipid panel - LDL 59, TG 153, HDL 62, TC 148 (rosuvastatin 5 mg daily)   Past Medical History:  Diagnosis Date   Allergy    seasonal   Ankylosing spondylitis (HCC)    Anxiety    Arthritis    Bell palsy    CAD (coronary artery disease)    a. Coronary CT 10/2016 showed calcium score in the 59th perecntile, moderate CAD in RCA/PDA and narrowing of the mid  to distal LAD, with negative FFR.   CTS (carpal tunnel syndrome)    Cystitis    Diabetes mellitus without complication ([place])    Diverticulitis    Diverticulosis of colon    Dry eye syndrome    Exocrine pancreatic insufficiency    Frozen shoulder    GERD (gastroesophageal reflux disease)    HLA B27 (HLA B27 positive)    Hx of colonic polyps    Hyperlipidemia    NMR 2005   Hypertension    Hypothyroidism    Incontinence    Internal hemorrhoids    Interstitial cystitis    Kidney stone    Menopause    Mild aortic insufficiency    Mild ascending aorta dilatation (HCC)    Osteoarthritis    Osteopenia    [facility] [address]   Pre-diabetes    PSVT (paroxysmal supraventricular tachycardia) ([place])    S/P total hysterectomy and bilateral salpingo-oophorectomy 04/12/2014   Sinusitis    Sleep apnea    c-pap   Subjective visual disturbance of both eyes 12/30/2013   Tubular adenoma of colon    Vitamin D deficiency    Xerostomia    and Xeroophthalmia    Current Outpatient Medications on File Prior to Visit  Medication Sig Dispense Refill   acetaminophen (TYLENOL) 500 MG tablet Take 1,000 mg by mouth 3 (three) times daily as needed.      amLODipine (NORVASC) 5 MG tablet Take 1 tablet (5 mg total) by mouth daily. 90 tablet 3   Ascorbic Acid (VITAMIN C) 1000 MG tablet Take 1,000 mg by mouth 2 (two) times daily.      aspirin 81 MG tablet Take 81 mg by mouth daily.     carvedilol (COREG) 3.125 MG tablet TAKE 1 TABLET BY MOUTH 2 TIMES DAILY. 60 tablet 1   chlorthalidone (HYGROTON) 25 MG tablet Take 0.5 tablets (12.5 mg total) by mouth daily. 30 tablet 1   Cholecalciferol (VITAMIN D-3) 25 MCG (1000 UT) CAPS Take 10,000 Units by mouth daily.      diclofenac Sodium (VOLTAREN) 1 % GEL Apply topically 4 (four) times daily as needed.      dicyclomine (BENTYL) 10 MG capsule Take 1 to 2 tablets three times daily as needed 90 capsule 1   EPINEPHrine (EPIPEN 2-PAK) 0.3 mg/0.3 mL IJ SOAJ injection Use as directed for severe allergic reactions 2 each 1   HYDROcodone-acetaminophen (NORCO/VICODIN) 5-325 MG tablet Take 1 tablet by mouth every 6 (six) hours as needed for moderate pain. 30 tablet 0   lipase/protease/amylase (CREON) 36000 UNITS CPEP capsule Take 2 capsules by mouth with each meal, 1 capsule by mouth with each snack 720 capsule 1   magnesium oxide (MAG-OX) 400 MG tablet Take 200 mg by mouth daily.     meclizine (ANTIVERT) 25 MG tablet Take 0.5 tablets (12.5 mg total) by mouth 2 (two) times daily as needed for dizziness. 30 tablet 3   Multiple Vitamins-Minerals (CENTRUM SILVER 50+WOMEN PO) Take 1  tablet by mouth daily.     naloxone (NARCAN) 4 MG/0.1ML LIQD nasal spray kit Narcan 4 mg/actuation nasal spray  PLACE 1 SPRAY INTO EACH NOSTRIL FOR SUSPECTED OPIOID OVERDOSE. MAY REPEAT 2 - 3 MINUTES WITH NEW BOTTLE IN OTHER NOSTRIL     Omega-3 Fatty Acids (FISH OIL BURP-LESS) 1000 MG CAPS Takes 1000 mg BID 60 capsule 0   omeprazole (PRILOSEC) 40 MG capsule Take 1 capsule (40 mg total) by mouth daily. 90 capsule  4   ondansetron (ZOFRAN) 4 MG tablet Take 1 tablet (4 mg total) by mouth every 8 (eight) hours as needed. 30 tablet 0   potassium chloride SA (KLOR-CON M20) 20 MEQ tablet Take 1 tablet (20 mEq total) by mouth daily. 30 tablet 1   vitamin B-12 (CYANOCOBALAMIN) 1000 MCG tablet Take 1,000 mcg by mouth 2 (two) times daily.      vitamin E 400 UNIT capsule Take 400 Units by mouth daily.     zinc gluconate 50 MG tablet Take 50 mg by mouth daily.     No current facility-administered medications on file prior to visit.    Allergies  Allergen Reactions   Bactrim [Sulfamethoxazole-Trimethoprim] Other (See Comments)   Nitroglycerin Anaphylaxis    Hypotensive after NTG administration in ER during chest pain evaluation Pt states she almost died,  Pt states in a procedure done in 10/2016 she had no reaction to this medication, so not sure if it is an actual allergy.    Pentosan Polysulfate Other (See Comments)    Elevated liver count Elevated liver enzymes Elevated LFTs.......Marland Kitchen elmiron    Pentosan Polysulfate Sodium     Elevated LFTs.......Marland Kitchen elmiron    Promethazine Hcl Other (See Comments)    "jittery on the inside"   Sulfamethoxazole-Trimethoprim Nausea Only    Unknown reaction per pt, Pt thinks she may have felt sick to her stomach.    Losartan Other (See Comments)    Pt states caused insomnia, tingling sensation in legs, lightheaded, stomach pain   Promethazine Hcl    Lisinopril Cough    Assessment/Plan:  1. Hyperlipidemia - LDL of 120 is above goal of <70. Discussed with patient  option to restart rosuvastatin 5 mg daily since she has tolerated this well and it has gotten her LDL below goal in the past. Also discussed Repatha ($34/3 months) and Nexlizet as options. Patient wishes to re-try low dose rosuvastatin. Will re-start rosuvastatin 5 mg daily. Patient will return for lipid panel, LFTs and CK in 6 weeks. Discussed with patient that at this time if LDL still above goal will consider adding Repatha vs ezetimibe. Informed patient to reduce added sugar in diet by aiming to drink less soda and sweet tea to help with triglycerides. Patient also informed to limit saturated fat to help with cholesterol and recommended Mediterranean diet. Discussed with patient to aim for 150 minutes/week of activity. Patient informed to contact us if she is experiencing any side effects.   Pt seen with Eliseo Gum, PGY1 PharmD resident  Harlon Flor. Supple, PharmD, BCACP, [facility] [address], [place], [place] [address] Phone: [phone]; Fax: [phone] 03/10/2022 12:05 PM

## 2022-03-10 ENCOUNTER — Ambulatory Visit (INDEPENDENT_AMBULATORY_CARE_PROVIDER_SITE_OTHER): Payer: Medicare Other | Admitting: Pharmacist

## 2022-03-10 DIAGNOSIS — E782 Mixed hyperlipidemia: Secondary | ICD-10-CM | POA: Diagnosis not present

## 2022-03-10 DIAGNOSIS — I2510 Atherosclerotic heart disease of native coronary artery without angina pectoris: Secondary | ICD-10-CM

## 2022-03-10 MED ORDER — ROSUVASTATIN CALCIUM 5 MG PO TABS: 5.0000 mg | ORAL_TABLET | Freq: Every day | ORAL | 5 refills | Status: DC

## 2022-03-10 NOTE — Patient Instructions (Addendum)
Your current LDL is 120. Your LDL goal is <70.  Start taking rosuvastatin 5 mg daily.  We will re-check lipid labs in 6 weeks on September 18th.   Try to drink less sweet tea and soda, and eating less saturated fat. We encourage the mediterranean diet.  Aim for 150 minutes/week of activity.   Call us if any side effects, if so can try Repatha. [phone]

## 2022-03-13 ENCOUNTER — Other Ambulatory Visit: Payer: Self-pay

## 2022-03-13 ENCOUNTER — Ambulatory Visit: Payer: Medicare Other

## 2022-03-13 ENCOUNTER — Encounter: Payer: Self-pay | Admitting: Internal Medicine

## 2022-03-13 DIAGNOSIS — L0231 Cutaneous abscess of buttock: Secondary | ICD-10-CM

## 2022-03-13 NOTE — Telephone Encounter (Signed)
Surgical center has no record of referral - Please send another referral over and they can take care of thisl.

## 2022-03-13 NOTE — Telephone Encounter (Signed)
Pt is requesting a call from [place]. Pt said Dr. Quay Burow advised her that she needed to schedule an appointment immediatly for her bug bite that is not healing. Dr Quay Burow first available is 03/27/22. Offered pt appointment with Harland Dingwall or Dr. Mitchel Honour on 03/15/22. Pt declined and requested to have Carla call her.  Please advise

## 2022-03-17 ENCOUNTER — Telehealth: Payer: Self-pay

## 2022-03-17 ENCOUNTER — Ambulatory Visit (INDEPENDENT_AMBULATORY_CARE_PROVIDER_SITE_OTHER): Payer: Medicare Other

## 2022-03-17 DIAGNOSIS — Z0000 Encounter for general adult medical examination without abnormal findings: Secondary | ICD-10-CM | POA: Diagnosis not present

## 2022-03-17 DIAGNOSIS — L0231 Cutaneous abscess of buttock: Secondary | ICD-10-CM | POA: Diagnosis not present

## 2022-03-17 NOTE — Patient Instructions (Signed)
[name] , Thank you for taking time to come for your Medicare Wellness Visit. I appreciate your ongoing commitment to your health goals. Please review the following plan we discussed and let me know if I can assist you in the future.   Screening recommendations/referrals: Colonoscopy: last done 01/23/2018; due every 5 years Mammogram: last done 10/18/2017 Bone Density: last done 01/24/2021; due every 2 years Recommended yearly ophthalmology/optometry visit for glaucoma screening and checkup Recommended yearly dental visit for hygiene and checkup  Vaccinations: Influenza vaccine: due Fall 2023 Pneumococcal vaccine: 05/27/2012, 02/25/2015 Tdap vaccine: 08/17/2020; due every 10 years Shingles vaccine: 11//16/2018, 08/20/2017   Covid-19: 09/13/2019, 10/08/2019, 06/04/2020  Advanced directives: Yes; Please bring a copy of your health care power of attorney and living will to the office at your convenience.  Conditions/risks identified: Yes  Next appointment: Please schedule your next Medicare Wellness Visit with your Nurse Health Advisor in 1 year by calling [phone].   Preventive Care 75 Years and Older, Female Preventive care refers to lifestyle choices and visits with your health care provider that can promote health and wellness. What does preventive care include? A yearly physical exam. This is also called an annual well check. Dental exams once or twice a year. Routine eye exams. Ask your health care provider how often you should have your eyes checked. Personal lifestyle choices, including: Daily care of your teeth and gums. Regular physical activity. Eating a healthy diet. Avoiding tobacco and drug use. Limiting alcohol use. Practicing safe sex. Taking low-dose aspirin every day. Taking vitamin and mineral supplements as recommended by your health care provider. What happens during an annual well check? The services and screenings done by your health care provider during your  annual well check will depend on your age, overall health, lifestyle risk factors, and family history of disease. Counseling  Your health care provider may ask you questions about your: Alcohol use. Tobacco use. Drug use. Emotional well-being. Home and relationship well-being. Sexual activity. Eating habits. History of falls. Memory and ability to understand (cognition). Work and work Statistician. Reproductive health. Screening  You may have the following tests or measurements: Height, weight, and BMI. Blood pressure. Lipid and cholesterol levels. These may be checked every 5 years, or more frequently if you are over 32 years old. Skin check. Lung cancer screening. You may have this screening every year starting at age 38 if you have a 30-pack-year history of smoking and currently smoke or have quit within the past 15 years. Fecal occult blood test (FOBT) of the stool. You may have this test every year starting at age 93. Flexible sigmoidoscopy or colonoscopy. You may have a sigmoidoscopy every 5 years or a colonoscopy every 10 years starting at age 57. Hepatitis C blood test. Hepatitis B blood test. Sexually transmitted disease (STD) testing. Diabetes screening. This is done by checking your blood sugar (glucose) after you have not eaten for a while (fasting). You may have this done every 1-3 years. Bone density scan. This is done to screen for osteoporosis. You may have this done starting at age 34. Mammogram. This may be done every 1-2 years. Talk to your health care provider about how often you should have regular mammograms. Talk with your health care provider about your test results, treatment options, and if necessary, the need for more tests. Vaccines  Your health care provider may recommend certain vaccines, such as: Influenza vaccine. This is recommended every year. Tetanus, diphtheria, and acellular pertussis (Tdap, Td) vaccine. You  may need a Td booster every 10  years. Zoster vaccine. You may need this after age 19. Pneumococcal 13-valent conjugate (PCV13) vaccine. One dose is recommended after age 87. Pneumococcal polysaccharide (PPSV23) vaccine. One dose is recommended after age 35. Talk to your health care provider about which screenings and vaccines you need and how often you need them. This information is not intended to replace advice given to you by your health care provider. Make sure you discuss any questions you have with your health care provider. Document Released: 08/13/2015 Document Revised: 04/05/2016 Document Reviewed: 05/18/2015 Elsevier Interactive Patient Education  2017 [employer] Prevention in the Home Falls can cause injuries. They can happen to people of all ages. There are many things you can do to make your home safe and to help prevent falls. What can I do on the outside of my home? Regularly fix the edges of walkways and driveways and fix any cracks. Remove anything that might make you trip as you walk through a door, such as a raised step or threshold. Trim any bushes or trees on the path to your home. Use bright outdoor lighting. Clear any walking paths of anything that might make someone trip, such as rocks or tools. Regularly check to see if handrails are loose or broken. Make sure that both sides of any steps have handrails. Any raised decks and porches should have guardrails on the edges. Have any leaves, snow, or ice cleared regularly. Use sand or salt on walking paths during winter. Clean up any spills in your garage right away. This includes oil or grease spills. What can I do in the bathroom? Use night lights. Install grab bars by the toilet and in the tub and shower. Do not use towel bars as grab bars. Use non-skid mats or decals in the tub or shower. If you need to sit down in the shower, use a plastic, non-slip stool. Keep the floor dry. Clean up any water that spills on the floor as soon as it  happens. Remove soap buildup in the tub or shower regularly. Attach bath mats securely with double-sided non-slip rug tape. Do not have throw rugs and other things on the floor that can make you trip. What can I do in the bedroom? Use night lights. Make sure that you have a light by your bed that is easy to reach. Do not use any sheets or blankets that are too big for your bed. They should not hang down onto the floor. Have a firm chair that has side arms. You can use this for support while you get dressed. Do not have throw rugs and other things on the floor that can make you trip. What can I do in the kitchen? Clean up any spills right away. Avoid walking on wet floors. Keep items that you use a lot in easy-to-reach places. If you need to reach something above you, use a strong step stool that has a grab bar. Keep electrical cords out of the way. Do not use floor polish or wax that makes floors slippery. If you must use wax, use non-skid floor wax. Do not have throw rugs and other things on the floor that can make you trip. What can I do with my stairs? Do not leave any items on the stairs. Make sure that there are handrails on both sides of the stairs and use them. Fix handrails that are broken or loose. Make sure that handrails are as long as the  stairways. Check any carpeting to make sure that it is firmly attached to the stairs. Fix any carpet that is loose or worn. Avoid having throw rugs at the top or bottom of the stairs. If you do have throw rugs, attach them to the floor with carpet tape. Make sure that you have a light switch at the top of the stairs and the bottom of the stairs. If you do not have them, ask someone to add them for you. What else can I do to help prevent falls? Wear shoes that: Do not have high heels. Have rubber bottoms. Are comfortable and fit you well. Are closed at the toe. Do not wear sandals. If you use a stepladder: Make sure that it is fully opened.  Do not climb a closed stepladder. Make sure that both sides of the stepladder are locked into place. Ask someone to hold it for you, if possible. Clearly mark and make sure that you can see: Any grab bars or handrails. First and last steps. Where the edge of each step is. Use tools that help you move around (mobility aids) if they are needed. These include: Canes. Walkers. Scooters. Crutches. Turn on the lights when you go into a dark area. Replace any light bulbs as soon as they burn out. Set up your furniture so you have a clear path. Avoid moving your furniture around. If any of your floors are uneven, fix them. If there are any pets around you, be aware of where they are. Review your medicines with your doctor. Some medicines can make you feel dizzy. This can increase your chance of falling. Ask your doctor what other things that you can do to help prevent falls. This information is not intended to replace advice given to you by your health care provider. Make sure you discuss any questions you have with your health care provider. Document Released: 05/13/2009 Document Revised: 12/23/2015 Document Reviewed: 08/21/2014 Elsevier Interactive Patient Education  2017 [employer].

## 2022-03-17 NOTE — Progress Notes (Signed)
I connected with [name] today by telephone and verified that I am speaking with the correct person using two identifiers. Location patient: home Location provider: work Persons participating in the virtual visit: patient, provider.   I discussed the limitations, risks, security and privacy concerns of performing an evaluation and management service by telephone and the availability of in person appointments. I also discussed with the patient that there may be a patient responsible charge related to this service. The patient expressed understanding and verbally consented to this telephonic visit.    Interactive audio and video telecommunications were attempted between this provider and patient, however failed, due to patient having technical difficulties OR patient did not have access to video capability.  We continued and completed visit with audio only.  Some vital signs may be absent or patient reported.   Time Spent with patient on telephone encounter: 30 minutes  Subjective:   [name] is a 76 y.o. female who presents for Medicare Annual (Subsequent) preventive examination.  Review of Systems     Cardiac Risk Factors include: advanced age (>53mn, >>80women);dyslipidemia;family history of premature cardiovascular disease;hypertension     Objective:    There were no vitals filed for this visit. There is no height or weight on file to calculate BMI.     03/17/2022    3:58 PM 01/18/2022    3:30 PM 07/13/2021    2:25 PM 03/16/2021    9:31 AM 06/10/2020    4:00 PM 12/04/2019    9:28 AM 02/18/2019    9:46 AM  Advanced Directives  Does Patient Have a Medical Advance Directive? Yes No Yes Yes Yes Yes No  Type of Advance Directive Living will;Healthcare Power of Attorney   Living will;[place]   Does patient want to make changes to medical advance directive? No - Patient declined   No - Patient declined No -  Patient declined No - Patient declined   [place] of [place]? No - copy requested   No - copy requested  No - copy requested   Would patient like information on creating a medical advance directive?       No - Patient declined    Current Medications (verified) Outpatient Encounter Medications as of 03/17/2022  Medication Sig   acetaminophen (TYLENOL) 500 MG tablet Take 1,000 mg by mouth 3 (three) times daily as needed.    amLODipine (NORVASC) 5 MG tablet Take 1 tablet (5 mg total) by mouth daily.   Ascorbic Acid (VITAMIN C) 1000 MG tablet Take 1,000 mg by mouth 2 (two) times daily.    aspirin 81 MG tablet Take 81 mg by mouth daily.   carvedilol (COREG) 3.125 MG tablet TAKE 1 TABLET BY MOUTH 2 TIMES DAILY.   chlorthalidone (HYGROTON) 25 MG tablet Take 0.5 tablets (12.5 mg total) by mouth daily.   Cholecalciferol (VITAMIN D-3) 25 MCG (1000 UT) CAPS Take 10,000 Units by mouth daily.    diclofenac Sodium (VOLTAREN) 1 % GEL Apply topically 4 (four) times daily as needed.    dicyclomine (BENTYL) 10 MG capsule Take 1 to 2 tablets three times daily as needed   EPINEPHrine (EPIPEN 2-PAK) 0.3 mg/0.3 mL IJ SOAJ injection Use as directed for severe allergic reactions   HYDROcodone-acetaminophen (NORCO/VICODIN) 5-325 MG tablet Take 1 tablet by mouth every 6 (six) hours as needed for moderate pain.   lipase/protease/amylase (CREON) 36000 UNITS CPEP capsule Take 2 capsules  by mouth with each meal, 1 capsule by mouth with each snack   magnesium oxide (MAG-OX) 400 MG tablet Take 200 mg by mouth daily.   meclizine (ANTIVERT) 25 MG tablet Take 0.5 tablets (12.5 mg total) by mouth 2 (two) times daily as needed for dizziness.   Multiple Vitamins-Minerals (CENTRUM SILVER 50+WOMEN PO) Take 1 tablet by mouth daily.   naloxone (NARCAN) 4 MG/0.1ML LIQD nasal spray kit Narcan 4 mg/actuation nasal spray  PLACE 1 SPRAY INTO EACH NOSTRIL FOR SUSPECTED OPIOID OVERDOSE. MAY REPEAT 2 - 3 MINUTES WITH  NEW BOTTLE IN OTHER NOSTRIL   Omega-3 Fatty Acids (FISH OIL BURP-LESS) 1000 MG CAPS Takes 1000 mg BID   omeprazole (PRILOSEC) 40 MG capsule Take 1 capsule (40 mg total) by mouth daily.   ondansetron (ZOFRAN) 4 MG tablet Take 1 tablet (4 mg total) by mouth every 8 (eight) hours as needed.   potassium chloride SA (KLOR-CON M20) 20 MEQ tablet Take 1 tablet (20 mEq total) by mouth daily.   rosuvastatin (CRESTOR) 5 MG tablet Take 1 tablet (5 mg total) by mouth daily.   vitamin B-12 (CYANOCOBALAMIN) 1000 MCG tablet Take 1,000 mcg by mouth 2 (two) times daily.    vitamin E 400 UNIT capsule Take 400 Units by mouth daily.   zinc gluconate 50 MG tablet Take 50 mg by mouth daily.   No facility-administered encounter medications on file as of 03/17/2022.    Allergies (verified) Bactrim [sulfamethoxazole-trimethoprim], Nitroglycerin, Pentosan polysulfate, Pentosan polysulfate sodium, Promethazine hcl, Sulfamethoxazole-trimethoprim, Losartan, Promethazine hcl, and Lisinopril   History: Past Medical History:  Diagnosis Date   Allergy    seasonal   Ankylosing spondylitis (HCC)    Anxiety    Arthritis    Bell palsy    CAD (coronary artery disease)    a. Coronary CT 10/2016 showed calcium score in the 59th perecntile, moderate CAD in RCA/PDA and narrowing of the mid to distal LAD, with negative FFR.   CTS (carpal tunnel syndrome)    Cystitis    Diabetes mellitus without complication ([place])    Diverticulitis    Diverticulosis of colon    Dry eye syndrome    Exocrine pancreatic insufficiency    Frozen shoulder    GERD (gastroesophageal reflux disease)    HLA B27 (HLA B27 positive)    Hx of colonic polyps    Hyperlipidemia    NMR 2005   Hypertension    Hypothyroidism    Incontinence    Internal hemorrhoids    Interstitial cystitis    Kidney stone    Menopause    Mild aortic insufficiency    Mild ascending aorta dilatation (HCC)    Osteoarthritis    Osteopenia    [facility]  [address]   Pre-diabetes    PSVT (paroxysmal supraventricular tachycardia) ([place])    S/P total hysterectomy and bilateral salpingo-oophorectomy 04/12/2014   Sinusitis    Sleep apnea    c-pap   Subjective visual disturbance of both eyes 12/30/2013   Tubular adenoma of colon    Vitamin D deficiency    Xerostomia    and Xeroophthalmia   Past Surgical History:  Procedure Laterality Date   BLADDER SURGERY     for incontinence   CARPAL TUNNEL RELEASE Left 12/22/2014   Procedure: LEFT CARPAL TUNNEL RELEASE;  Surgeon: Daryll Brod, MD;  Location: [facility];  Service: Orthopedics;  Laterality: Left;   CARPAL TUNNEL RELEASE Right 02/18/2019   Procedure: RIGHT CARPAL TUNNEL  RELEASE;  Surgeon: Daryll Brod, MD;  Location: [facility];  Service: Orthopedics;  Laterality: Right;   cataract excision Right 2022   COLONOSCOPY W/ POLYPECTOMY     adenomatous poyp 04-2008   colonscopy     tics 10-2002   CYSTOSCOPY WITH RETROGRADE PYELOGRAM, URETEROSCOPY AND STENT PLACEMENT Left 09/01/2013   Procedure: CYSTOSCOPY WITH RETROGRADE PYELOGRAM, AND LEFT STENT PLACEMENT;  Surgeon: Molli Hazard, MD;  Location: [facility];  Service: Urology;  Laterality: Left;   elevated LFTs     due to [place]   FINGER SURGERY Left 2004   2nd finger   G 1 P 1     interstitial cystitis     with clot in catheter   PARTIAL HYSTERECTOMY     with one ovary left   SHOULDER ARTHROSCOPY Left    TOTAL ABDOMINAL HYSTERECTOMY W/ BILATERAL SALPINGOOPHORECTOMY     dysfunctional menses   Family History  Problem Relation Age of Onset   Coronary artery disease Mother    Hypertension Mother    Asthma Mother    Dementia Mother    Stroke Mother        mini cva   Diabetes Father    Cirrhosis Father        non alcoholic   Colon cancer Sister 26   Allergic rhinitis Sister    Allergic rhinitis Sister    Food Allergy Sister    Renal cancer Paternal Aunt        x 2   Coronary artery disease Maternal  Grandfather    Breast cancer Maternal Aunt    Diabetes Paternal Aunt    Diabetes Paternal Grandmother    Esophageal cancer Neg Hx    Stomach cancer Neg Hx    Social History   Socioeconomic History   Marital status: Married    Spouse name: Not on file   Number of children: 1   Years of education: BS   Highest education level: Not on file  Occupational History   Occupation: retired   Occupation: Pharmacist, hospital    Comment: Kindergarten  Tobacco Use   Smoking status: Never   Smokeless tobacco: Never  Vaping Use   Vaping Use: Never used  Substance and Sexual Activity   Alcohol use: Yes    Comment: social   Drug use: No   Sexual activity: Not Currently  Other Topics Concern   Not on file  Social History Narrative   Lives at home with husband, is his caregiver   No daily use of caffeine.   Right-handed.         Social Determinants of Health   Financial Resource Strain: Low Risk  (03/17/2022)   Overall Financial Resource Strain (CARDIA)    Difficulty of Paying Living Expenses: Not hard at all  Food Insecurity: No Food Insecurity (03/17/2022)   Hunger Vital Sign    Worried About Running Out of Food in the Last Year: Never true    Ran Out of Food in the Last Year: Never true  Transportation Needs: No Transportation Needs (03/17/2022)   PRAPARE - Hydrologist (Medical): No    Lack of Transportation (Non-Medical): No  Physical Activity: Sufficiently Active (03/17/2022)   Exercise Vital Sign    Days of Exercise per Week: 7 days    Minutes of Exercise per Session: 30 min  Stress: No Stress Concern Present (03/17/2022)   [place]  of Stress : Not at all  Social Connections: Socially Integrated (03/17/2022)   Social Connection and Isolation Panel [NHANES]    Frequency of Communication with Friends and Family: More than three times a week    Frequency of Social Gatherings with  Friends and Family: Once a week    Attends Religious Services: 1 to 4 times per year    Active Member of [employer] or Organizations: Yes    Attends Archivist Meetings: 1 to 4 times per year    Marital Status: Married    Tobacco Counseling Counseling given: Not Answered   Clinical Intake:  Pre-visit preparation completed: Yes  Pain : No/denies pain     Nutritional Risks: None Diabetes: No  How often do you need to have someone help you when you read instructions, pamphlets, or other written materials from your doctor or pharmacy?: 1 - Never What is the last grade level you completed in school?: B.S. Degree in Elementary Education  Diabetic? no  Interpreter Needed?: No  Information entered by :: Lisette Abu, LPN.   Activities of Daily Living    03/17/2022    3:59 PM  In your present state of health, do you have any difficulty performing the following activities:  Hearing? 0  Vision? 0  Difficulty concentrating or making decisions? 0  Walking or climbing stairs? 0  Dressing or bathing? 0  Doing errands, shopping? 0  Using the Toilet? N  In the past six months, have you accidently leaked urine? Y  Do you have problems with loss of bowel control? N  Managing your Medications? N  Managing your Finances? N  Housekeeping or managing your Housekeeping? N    Patient Care Team: Binnie Rail, MD as PCP - General (Internal Medicine) Freada Bergeron, MD as PCP - Cardiology (Cardiology) Pyrtle, Lajuan Lines, MD as Consulting Physician (Gastroenterology) Lyndal Pulley, DO as Consulting Physician (Family Medicine) Katy Apo, MD as Consulting Physician (Ophthalmology)  Indicate any recent Medical Services you may have received from other than [facility] providers in the past year (date may be approximate).     Assessment:   This is a routine wellness examination for [place].  Hearing/Vision screen Hearing Screening - Comments:: Patient denied any hearing  difficulty.   No hearing aids.  Vision Screening - Comments:: Patient does wear readers.  Eye exam done by: Katy Apo, MD.   Dietary issues and exercise activities discussed: Current Exercise Habits: Home exercise routine, Type of exercise: walking, Time (Minutes): 30, Frequency (Times/Week): 7, Weekly Exercise (Minutes/Week): 210, Intensity: Moderate, Exercise limited by: orthopedic condition(s)   Goals Addressed             This Visit's Progress    DIET - INCREASE WATER INTAKE       Stay healthy.      Depression Screen    03/17/2022    3:52 PM 01/06/2022    8:25 AM 03/16/2021    9:33 AM 06/10/2020    4:01 PM 12/04/2019    9:28 AM 11/29/2018   11:48 AM 11/23/2017    9:58 AM  PHQ 2/9 Scores  PHQ - 2 Score 0 0 0 0 0 2 2  PHQ- 9 Score  3    2 3     Fall Risk    03/17/2022    3:51 PM 03/02/2022    3:02 PM 01/06/2022    8:25 AM 03/16/2021    9:32 AM 06/10/2020    4:01 PM  Fall Risk  Falls in the past year? 0 0 0 0 0  Number falls in past yr: 0  0 0 0  Injury with Fall? 0  0 0   Risk for fall due to : No Fall Risks  No Fall Risks No Fall Risks   Follow up Falls evaluation completed  Falls evaluation completed Falls evaluation completed     [place]:  Any stairs in or around the home? Yes  If so, are there any without handrails? No  Home free of loose throw rugs in walkways, pet beds, electrical cords, etc? Yes  Adequate lighting in your home to reduce risk of falls? Yes   ASSISTIVE DEVICES UTILIZED TO PREVENT FALLS:  Life alert? No  Use of a cane, walker or w/c? No  Grab bars in the bathroom? Yes  Shower chair or bench in shower? No  Elevated toilet seat or a handicapped toilet? No   TIMED UP AND GO:  Was the test performed? No .  Length of time to ambulate 10 feet: n/a sec.   Appearance of gait: Gait not evaluated during this visit.  Cognitive Function:        03/17/2022    4:01 PM  6CIT Screen  What Year? 0 points   What month? 0 points  What time? 0 points  Count back from 20 0 points  Months in reverse 0 points  Repeat phrase 0 points  Total Score 0 points    Immunizations Immunization History  Administered Date(s) Administered   Fluad Quad(high Dose 65+) 04/02/2019, 04/20/2020   Influenza Split 05/09/2011, 05/07/2012   Influenza Whole 08/01/2003, 05/29/2007, 04/15/2008, 04/30/2009, 05/03/2010   Influenza, High Dose Seasonal PF 05/01/2013, 04/18/2014, 04/13/2016, 05/02/2017, 04/15/2018   Influenza,inj,Quad PF,6+ Mos 05/21/2015   PFIZER(Purple Top)SARS-COV-2 Vaccination 09/13/2019, 10/08/2019, 06/04/2020   Pneumococcal Conjugate-13 02/25/2015   Pneumococcal Polysaccharide-23 07/31/2005, 05/27/2012   Td 06/27/2010   Tdap 08/17/2020   Zoster Recombinat (Shingrix) 06/15/2017, 08/20/2017   Zoster, Live 07/19/2012    TDAP status: Up to date  Flu Vaccine status: Up to date  Pneumococcal vaccine status: Up to date  Covid-19 vaccine status: Completed vaccines  Qualifies for Shingles Vaccine? Yes   Zostavax completed Yes   Shingrix Completed?: Yes  Screening Tests Health Maintenance  Topic Date Due   FOOT EXAM  11/02/2018   OPHTHALMOLOGY EXAM  05/14/2019   COVID-19 Vaccine (4 - Pfizer series) 07/30/2020   INFLUENZA VACCINE  02/28/2022   Diabetic kidney evaluation - Urine ACR  06/03/2022   HEMOGLOBIN A1C  09/02/2022   COLONOSCOPY (Pts 45-41yr Insurance coverage will need to be confirmed)  01/24/2023   DEXA SCAN  01/25/2023   Diabetic kidney evaluation - GFR measurement  02/28/2023   TETANUS/TDAP  08/17/2030   Pneumonia Vaccine 76 Years old  Completed   Hepatitis C Screening  Completed   Zoster Vaccines- Shingrix  Completed   HPV VACCINES  Aged Out    Health Maintenance  Health Maintenance Due  Topic Date Due   FOOT EXAM  11/02/2018   OPHTHALMOLOGY EXAM  05/14/2019   COVID-19 Vaccine (4 - Pfizer series) 07/30/2020   INFLUENZA VACCINE  02/28/2022    Colorectal cancer  screening: Type of screening: Colonoscopy. Completed 01/23/2018. Repeat every 5 years  Mammogram status: Completed 10/18/2017. Repeat every year  Bone Density status: Completed 01/24/2021. Results reflect: Bone density results: NORMAL. Repeat every 2-5 years.  Lung Cancer Screening: (Low Dose CT Chest recommended if Age 78-80years, 348  pack-year currently smoking OR have quit w/in 15years.) does not qualify.   Lung Cancer Screening Referral: no  Additional Screening:  Hepatitis C Screening: does qualify; Completed 05/21/2015  Vision Screening: Recommended annual ophthalmology exams for early detection of glaucoma and other disorders of the eye. Is the patient up to date with their annual eye exam?  Yes  Who is the provider or what is the name of the office in which the patient attends annual eye exams? Katy Apo, MD. If pt is not established with a provider, would they like to be referred to a provider to establish care? No .   Dental Screening: Recommended annual dental exams for proper oral hygiene  Community Resource Referral / Chronic Care Management: CRR required this visit?  No   CCM required this visit?  No      Plan:     I have personally reviewed and noted the following in the patient's chart:   Medical and social history Use of alcohol, tobacco or illicit drugs  Current medications and supplements including opioid prescriptions. Patient is not currently taking opioid prescriptions. Functional ability and status Nutritional status Physical activity Advanced directives List of other physicians Hospitalizations, surgeries, and ER visits in previous 12 months Vitals Screenings to include cognitive, depression, and falls Referrals and appointments  In addition, I have reviewed and discussed with patient certain preventive protocols, quality metrics, and best practice recommendations. A written personalized care plan for preventive services as well as general  preventive health recommendations were provided to patient.     Sheral Flow, LPN   8/33/8250   Nurse Notes:  Patient is cogitatively intact. There were no vitals filed for this visit. There is no height or weight on file to calculate BMI. Patient stated that she has no issues with gait or balance; does not use any assistive devices.

## 2022-03-17 NOTE — Telephone Encounter (Signed)
Patient returned call. AWV Completed.

## 2022-03-18 ENCOUNTER — Emergency Department (HOSPITAL_BASED_OUTPATIENT_CLINIC_OR_DEPARTMENT_OTHER)
Admission: EM | Admit: 2022-03-18 | Discharge: 2022-03-18 | Disposition: A | Discharge status: Home / Self Care | Payer: Medicare Other | Attending: Emergency Medicine | Admitting: Emergency Medicine

## 2022-03-18 ENCOUNTER — Encounter (HOSPITAL_BASED_OUTPATIENT_CLINIC_OR_DEPARTMENT_OTHER): Payer: Self-pay | Admitting: Emergency Medicine

## 2022-03-18 DIAGNOSIS — L0231 Cutaneous abscess of buttock: Secondary | ICD-10-CM | POA: Insufficient documentation

## 2022-03-18 DIAGNOSIS — Z4800 Encounter for change or removal of nonsurgical wound dressing: Secondary | ICD-10-CM | POA: Diagnosis not present

## 2022-03-18 DIAGNOSIS — Z7982 Long term (current) use of aspirin: Secondary | ICD-10-CM | POA: Insufficient documentation

## 2022-03-18 DIAGNOSIS — Z09 Encounter for follow-up examination after completed treatment for conditions other than malignant neoplasm: Secondary | ICD-10-CM

## 2022-03-18 DIAGNOSIS — Z79899 Other long term (current) drug therapy: Secondary | ICD-10-CM | POA: Diagnosis not present

## 2022-03-18 DIAGNOSIS — Z4801 Encounter for change or removal of surgical wound dressing: Secondary | ICD-10-CM | POA: Diagnosis not present

## 2022-03-18 MED ORDER — LIDOCAINE-EPINEPHRINE-TETRACAINE (LET) TOPICAL GEL
3.0000 mL | Freq: Once | TOPICAL | Status: AC
  Administered 2022-03-18: 3 mL via TOPICAL
  Filled 2022-03-18: qty 3

## 2022-03-18 NOTE — ED Provider Notes (Signed)
[place] EMERGENCY DEPARTMENT Provider Note   CSN: [id number] Arrival date & time: 03/18/22  1301     History  Chief Complaint  Patient presents with   Abscess    [name] is a 76 y.o. female.   Abscess   Is a 76 year old female presented emergency room today with complaint of needing packing removed from an abscess that was packed by general surgery yesterday.  She states that she was told to pull the packing out today but was unable to because it was uncomfortable.  She denies any fevers cough congestion rhinitis dizziness.  She has no new symptoms today.  She states that the area continues to be quite painful.  The area is located on her left buttocks.  She states that she has actually had issues with this area for approximately 8 months with some improvement in her symptoms and then worsening again which led to the eventual incision and drainage.  She is being followed by general surgery and states she has a follow-up appointment with me on later this week.    Home Medications Prior to Admission medications   Medication Sig Start Date End Date Taking? Authorizing Provider  acetaminophen (TYLENOL) 500 MG tablet Take 1,000 mg by mouth 3 (three) times daily as needed.     [provider]  amLODipine (NORVASC) 5 MG tablet Take 1 tablet (5 mg total) by mouth daily. 02/06/22 02/01/23  Dunn, Nedra Hai, PA-C  Ascorbic Acid (VITAMIN C) 1000 MG tablet Take 1,000 mg by mouth 2 (two) times daily.     [provider]  aspirin 81 MG tablet Take 81 mg by mouth daily.    [provider]  carvedilol (COREG) 3.125 MG tablet TAKE 1 TABLET BY MOUTH 2 TIMES DAILY. 02/01/22   Dunn, Nedra Hai, PA-C  chlorthalidone (HYGROTON) 25 MG tablet Take 0.5 tablets (12.5 mg total) by mouth daily. 02/10/22   Dunn, Nedra Hai, PA-C  Cholecalciferol (VITAMIN D-3) 25 MCG (1000 UT) CAPS Take 10,000 Units by mouth daily.     [provider]  diclofenac Sodium (VOLTAREN) 1 %  GEL Apply topically 4 (four) times daily as needed.     [provider]  dicyclomine (BENTYL) 10 MG capsule Take 1 to 2 tablets three times daily as needed 01/24/22   Pyrtle, Lajuan Lines, MD  EPINEPHrine (EPIPEN 2-PAK) 0.3 mg/0.3 mL IJ SOAJ injection Use as directed for severe allergic reactions 01/06/22   Binnie Rail, MD  HYDROcodone-acetaminophen (NORCO/VICODIN) 5-325 MG tablet Take 1 tablet by mouth every 6 (six) hours as needed for moderate pain. 01/10/22   Binnie Rail, MD  lipase/protease/amylase (CREON) 36000 UNITS CPEP capsule Take 2 capsules by mouth with each meal, 1 capsule by mouth with each snack 07/08/21   Esterwood, Amy S, PA-C  magnesium oxide (MAG-OX) 400 MG tablet Take 200 mg by mouth daily.    [provider]  meclizine (ANTIVERT) 25 MG tablet Take 0.5 tablets (12.5 mg total) by mouth 2 (two) times daily as needed for dizziness. 01/19/22   Dunn, Nedra Hai, PA-C  Multiple Vitamins-Minerals (CENTRUM SILVER 50+WOMEN PO) Take 1 tablet by mouth daily.    [provider]  naloxone (NARCAN) 4 MG/0.1ML LIQD nasal spray kit Narcan 4 mg/actuation nasal spray  PLACE 1 SPRAY INTO EACH NOSTRIL FOR SUSPECTED OPIOID OVERDOSE. MAY REPEAT 2 - 3 MINUTES WITH NEW BOTTLE IN OTHER NOSTRIL    [provider]  Omega-3 Fatty Acids (Griffith)  1000 MG CAPS Takes 1000 mg BID 01/03/16   Esterwood, Amy S, PA-C  omeprazole (PRILOSEC) 40 MG capsule Take 1 capsule (40 mg total) by mouth daily. 07/08/21 07/08/22  Esterwood, Amy S, PA-C  ondansetron (ZOFRAN) 4 MG tablet Take 1 tablet (4 mg total) by mouth every 8 (eight) hours as needed. 01/06/22   Binnie Rail, MD  potassium chloride SA (KLOR-CON M20) 20 MEQ tablet Take 1 tablet (20 mEq total) by mouth daily. 02/10/22   Dunn, Nedra Hai, PA-C  rosuvastatin (CRESTOR) 5 MG tablet Take 1 tablet (5 mg total) by mouth daily. 03/10/22   Freada Bergeron, MD  vitamin B-12 (CYANOCOBALAMIN) 1000 MCG tablet Take 1,000 mcg by mouth 2 (two)  times daily.     [provider]  vitamin E 400 UNIT capsule Take 400 Units by mouth daily.    [provider]  zinc gluconate 50 MG tablet Take 50 mg by mouth daily.    [provider]      Allergies    Bactrim [sulfamethoxazole-trimethoprim], Nitroglycerin, Pentosan polysulfate, Pentosan polysulfate sodium, Promethazine hcl, Sulfamethoxazole-trimethoprim, Losartan, Promethazine hcl, and Lisinopril    Review of Systems   Review of Systems  Physical Exam Updated Vital Signs BP (!) 145/73   Pulse 65   Temp 97.6 F (36.4 C) (Oral)   Resp 16   SpO2 100%  Physical Exam Vitals and nursing note reviewed.  Constitutional:      General: She is not in acute distress.    Appearance: Normal appearance. She is not ill-appearing.  HENT:     Head: Normocephalic and atraumatic.  Eyes:     General: No scleral icterus.       Right eye: No discharge.        Left eye: No discharge.     Conjunctiva/sclera: Conjunctivae normal.  Cardiovascular:     Rate and Rhythm: Normal rate.  Pulmonary:     Effort: Pulmonary effort is normal.     Breath sounds: No stridor.  Skin:    General: Skin is warm and dry.     Comments: Left buttocks with approximately 2 cm area of redness with previously incised abscess.  There is somewhat indurated tissue surrounding this area.  No fluctuance currently and no drainage.  Packing is still in place.  Neurological:     Mental Status: She is alert and oriented to person, place, and time. Mental status is at baseline.     ED Results / Procedures / Treatments   Labs (all labs ordered are listed, but only abnormal results are displayed) Labs Reviewed - No data to display  EKG None  Radiology No results found.  Procedures Procedures    Medications Ordered in ED Medications  lidocaine-EPINEPHrine-tetracaine (LET) topical gel (3 mLs Topical Given 03/18/22 1727)    ED Course/ Medical Decision Making/ A&P                            Medical Decision Making  Is a 77 year old female presented emergency room today with complaint of needing packing removed from an abscess that was packed by general surgery yesterday.  She states that she was told to pull the packing out today but was unable to because it was uncomfortable.  She denies any fevers cough congestion rhinitis dizziness.  She has no new symptoms today.  She states that the area continues to be quite painful.  The area is located on her left  buttocks.  She states that she has actually had issues with this area for approximately 8 months with some improvement in her symptoms and then worsening again which led to the eventual incision and drainage.  She is being followed by general surgery and states she has a follow-up appointment with me on later this week.   Physical exam notable for small mount of indurated and erythematous tissue surrounding incision.  I removed packing after let was placed.  Patient tolerated procedure well.  Will discharge home with follow-up with her general surgeon as planned.  No negation for antibiotics at this time.  Recommend close wound surveillance and good wound care.  She is already been instructed and educated by her general Psychologist, sport and exercise.   Final Clinical Impression(s) / ED Diagnoses Final diagnoses:  Encounter for recheck of abscess following incision and drainage    Rx / DC Orders ED Discharge Orders     None         Tedd Sias, [place] 03/18/22 1828    Lennice Sites, DO 03/18/22 2229

## 2022-03-18 NOTE — ED Triage Notes (Signed)
Pt reports she had an abscess on her buttock that was lanced yesterday and packed. Pt reports packing is stuck, and it is more painful now.

## 2022-03-18 NOTE — Progress Notes (Unsigned)
Cardiology Office Note    Date:  03/21/2022   ID:  Donyelle, Enyeart Oct 21, 1945, MRN [id number]  PCP:  Binnie Rail, MD  Cardiologist:  Previously Dr Meda Coffee but patient has requested f/u with me. Electrophysiologist:  None   Chief Complaint: f/u HTN  History of Present Illness:   [name] is a 76 y.o. female with history of of moderate CAD by coronary CT, aortic atherosclerosis, mild-moderate AI by echo 06/2021 with mildly dilated ascending aorta, HTN, PSVT, ankylosing spondylitis/HLA B27 positive, anxiety, osteoarthritis, Bell's palsy, diverticulosis, pancreatic insufficiency, HLD, HTN, interstitial cystitis, OA, pre-DM, OSA on CPAP (followed at [facility]), mild anemia who presents for f/u. She has requested close cardiology follow-up on a regular basis. [name] (who has a complex medical history and for whom she is primary caregiver) and daughter [name] are also followed by our clinic.   She previously followed with Dr. Meda Coffee for history of atypical sharp chest pain and nonobstructive CAD. Last ischemic assessment was by coronary CTA in 07/2020 with moderate stenosis in the prox and distal RCA, mild stenosis in the prox LAD with negative FFR, no abnormalities in aorta. Event monitor 2022 showed NSR with short runs of SVT, rare PVCs, PACs, average HR 75bpm, range 42-176 (low reading was during sleeping hours). At that time, palpitations resolved without intervention. In a previous year she had reported lower extremity edema and eye puffiness. Amlodipine was changed to HCTZ but eventually switched back at a later time by primary care as she had muscle cramping. In 06/2021 she wished to revisit diuretic for trace edema so was trialed on spironolactone but felt poorly with this (felt like she was not able to void) so went back to amlodipine. Last echo 06/2021 EF 60-65%, G1Dd, normal RV, mild-moderate AI and mild dilation of ascending aorta. At [facility] 09/2021, she reported intermittent fleeting  dizziness prompting repeat monitor showing predominantly NSR with average HR 75bpm, with 20 nonsustained SVT events (longest 14 beats), triggered events mostly correlated with NSR, occasionally with PACS/PVCs and 1 run of SVT - no sustained arrhythmias or pauses were noted. I had suggested initiation of carvedilol to address both SVT and elevated BP. At f/u 01/18/22 she reported continued dizziness/disequilibrium and new thought disturbances so was sent to ER for CVA r/o with reassuring workup including CTA head/neck. Meclizine was rx'd and recommended to f/u neurology. At last follow-up, we added chlorthalidone with potassium for elevated BP.   She previously followed with the lipid clinic in the past for cholesterol. She has a history of mylagias/RLS-type neuropathy although it was not clear this was specifically related to statin itself. LE arterial testing 2020 was normal. She was also noted to previously have mildly elevated CK which normalized off statin earlier this year She saw pharmD in follow-up 02/2022 given issues with statin/CK and plan to re-trial low dose rosuvastatin with close 6 week lab follow-up.  She returns for follow-up today of her blood pressure and is largely stable from cardiac standpoint without any new symptoms. Has rare flutters, self-limited. Edema has improved. Home BPs largely controlled. Personal check by me 122/68. She has been following with gen surg recently for her buttocks abscess.  Labwork independently reviewed: 02/2022 A1C 6.4 01/2022 trig 188, LDL 120 (off statin due to elevated CK), K 4.0, Cr 1.08, CK 245 (on statin), decreased to 167 off statin 12/2021 TSH wnl, ammonia, wnl, trops neg x2, INR wnl, CBC wnl, K 3.7, Cr 0.91, LFTs OK, glu 101  09/2021 LDL 43  Cardiology Studies:   Studies reviewed are outlined and summarized above. Reports included below if pertinent.   CTA Head Neck 12/2021 CT HEAD FINDINGS   Brain: No evidence of acute infarction, hemorrhage,  cerebral edema, mass, mass effect, or midline shift. No hydrocephalus or extra-axial fluid collection.   Vascular: No hyperdense vessel.   Skull: Normal. Negative for fracture or focal lesion.   Sinuses/Orbits: No acute finding.   Other: The mastoid air cells are well aerated.   CTA NECK FINDINGS   Aortic arch: 4 vessel arch, with the left vertebral artery originating from the aorta. Imaged portion shows no evidence of aneurysm or dissection. No significant stenosis of the major arch vessel origins.   Right carotid system: No evidence of dissection, occlusion, or hemodynamically significant stenosis (greater than 50%).   Left carotid system: No evidence of dissection, occlusion, or hemodynamically significant stenosis (greater than 50%).   Vertebral arteries: Codominant. No evidence of dissection, occlusion, or hemodynamically significant stenosis (greater than 50%).   Skeleton: No acute osseous abnormality. Degenerative changes in the cervical spine.   Other neck: No acute finding.   Upper chest: Volume loss in the right upper lobe, with vascular crowding. No focal pulmonary opacity or pleural effusion.   Review of the MIP images confirms the above findings   CTA HEAD FINDINGS   Anterior circulation: Both internal carotid arteries are patent to the termini, mild calcifications but without significant stenosis.   A1 segments patent, with a very hypoplastic right A1. Normal anterior communicating artery. Anterior cerebral arteries are patent to their distal aspects.   No M1 stenosis or occlusion. Normal MCA bifurcations. Distal MCA branches perfused and symmetric.   Posterior circulation: Vertebral arteries patent to the vertebrobasilar junction without stenosis. Posterior inferior cerebellar arteries patent proximally.   Basilar patent to its distal aspect. Superior cerebellar arteries patent proximally.   Patent left P1. Fetal origin of the right PCA from a  patent right posterior communicating artery. PCAs perfused to their distal aspects without stenosis. The possible diminutive left posterior communicating artery.   Venous sinuses: As permitted by contrast timing, patent.   Anatomic variants: Fetal origin of the right PCA.   Review of the MIP images confirms the above findings   IMPRESSION: 1.  No intracranial large vessel occlusion or significant stenosis. 2.  No hemodynamically significant stenosis in the neck. 3.  No acute intracranial process.     Electronically Signed   By: Merilyn Baba M.D.   On: 01/18/2022 23:32   Cor CTA 07/2020 FINDINGS: A 100 kV prospective scan was triggered in the descending thoracic aorta at 111 HU's. Axial non-contrast 3 mm slices were carried out through the heart. The data set was analyzed on a dedicated work station and scored using the [place]. Gantry rotation speed was 250 msecs and collimation was .6 mm. 50 mg of PO Metoprolol and 0.8 mg of sl NTG was given. The 3D data set was reconstructed in 5% intervals of the 67-82 % of the R-R cycle. Diastolic phases were analyzed on a dedicated work station using MPR, MIP and VRT modes. The patient received 80 cc of contrast.   Aorta: Normal size. Minimal atherosclerotic plaque and calcifications. No dissection.   Aortic Valve:  Trileaflet.  Trivial calcifications.   Coronary Arteries:  Normal coronary origin.  Right dominance.   RCA is a large dominant artery that gives rise to PDA and PLA. There is moderate diffuse calcified plaque in the  proximal portion with stenosis 50-69%. Distal RCA has moderate mixed plaque with stenosis 50-69%. PLA and PDA are small with minimal plaque.   Left main is a large artery that gives rise to LAD and LCX arteries. Left main is a large vessel with only luminal irregularities.   LAD is a large vessel that gives rise to one diagonal artery. There is mild predominantly calcified plaque in the proximal  portion with stenosis 25-49%. Mid and distal LAD have only luminal irregularities.   D1 is small and has mild calcified focal stenosis in the proximal portion with stenosis 25-49%.   LCX is a non-dominant artery that gives rise to one large OM1 branch. There are only minimal irregularities.   Other findings:   Normal pulmonary vein drainage into the left atrium.   Normal left atrial appendage without a thrombus.   Normal size of the pulmonary artery.   IMPRESSION: 1. Coronary calcium score of 199. This was 71 percentile for age and sex matched control. In 2018 it was calcium score of 56 (10 percentile for age and sex matched control).   2. Normal coronary origin with right dominance.   3. CAD-RADS 3. Moderate stenosis in the proximal and distal RCA, mild stenosis in the proximal LAD. Consider symptom-guided anti-ischemic pharmacotherapy as well as risk factor modification per guideline directed care. Additional analysis with CT FFR will be submitted. There is progression of disease since the prior CCTA in 2018.   FINDINGS: FFRct analysis was performed on the original cardiac CT angiogram dataset. Diagrammatic representation of the FFRct analysis is provided in a separate PDF document in PACS. This dictation was created using the PDF document and an interactive 3D model of the results. 3D model is not available in the EMR/PACS. Normal FFR range is >0.80.   1. Left Main: 0.99.   2. LAD: Proximal: 0.98, mid: 0.94, distal: 0.90. 3. D1: 0.96. 4. LCX: 0.99. 5. OM1: 0.98 6. RCA: Proximal: 0.99, mid: 0.94, distal: 0.88.   IMPRESSION: 1. CT FFR analysis didn't show any significant stenosis. Intensify medical management to slow down disease progression.     Electronically Signed   By: Ena Dawley   On: 08/26/2020 22:37   Monitor 07/2020 Patient had a min HR of 42 bpm, max HR of 176 bpm, and avg HR of 75 bpm 11 very short Supraventricular Tachycardia runs occurred,  the run with the fastest interval lasting 4 beats with a max rate of 176 bpm, the longest lasting 10 beats with an avg rate of 114 bpm. Rare PACs and PVCs.   Predominant underlying rhythm was Sinus Rhythm. 11 very short Supraventricular Tachycardia runs occurred. the run with the fastest interval lasting 4 beats with a max rate of 176 bpm, the  longest lasting 10 beats with an avg rate of 114 bpm.    2D echo 06/2020    1. Left ventricular ejection fraction, by estimation, is 60 to 65%. The  left ventricle has normal function. The left ventricle has no regional  wall motion abnormalities. There is mild concentric left ventricular  hypertrophy. Left ventricular diastolic  parameters are consistent with Grade I diastolic dysfunction (impaired  relaxation).   2. Right ventricular systolic function is normal. The right ventricular  size is normal.   3. The mitral valve is normal in structure. No evidence of mitral valve  regurgitation. No evidence of mitral stenosis.   4. The aortic valve is tricuspid. Aortic valve regurgitation is mild to  moderate. No aortic  stenosis is present.   5. Aortic dilatation noted. There is mild dilatation of the ascending  aorta, measuring 38 mm.   6. The inferior vena cava is normal in size with greater than 50%  respiratory variability, suggesting right atrial pressure of 3 mmHg.   Comparison(s): No significant change from prior study. Prior images  reviewed side by side   2D Echo 06/30/21  1. Left ventricular ejection fraction, by estimation, is 60 to 65%. The  left ventricle has normal function. The left ventricle has no regional  wall motion abnormalities. There is mild concentric left ventricular  hypertrophy. Left ventricular diastolic  parameters are consistent with Grade I diastolic dysfunction (impaired  relaxation).   2. Right ventricular systolic function is normal. The right ventricular  size is normal.   3. The mitral valve is normal in  structure. Trivial mitral valve  regurgitation.   4. The aortic valve is tricuspid. There is mild calcification of the  aortic valve. There is mild thickening of the aortic valve. Aortic valve  regurgitation is mild to moderate. Aortic valve sclerosis is present, with  no evidence of aortic valve  stenosis.   5. There is mild dilatation of the ascending aorta, measuring 37 mm.   6. The inferior vena cava is normal in size with greater than 50%  respiratory variability, suggesting right atrial pressure of 3 mmHg.   Comparison(s): Compared to prior TTE in 06/2020, there is no significant  change. The AR remains mild-to-moderate and the ascending aorta size  remains stable in size (previously reported at 69m).      Monitor 10/2021   Patient wear time was 14 days and 7 hours Predominant rhythm was NSR with average HR 75bpm A total of 20 nonsustained SVT episodes occurred with longest lasting 14 beats. Rare SVE and VE (<1%) Patient triggered events mainly correlated with NSR; occasionally with SVE, VE, and 1 run of SVT No sustained arrhythmias or significant pauses     Patch Wear Time:  14 days and 7 hours (2023-03-24T10:27:38-0400 to 2023-04-07T18:20:38-0400)   Monitor 1 Patient had a min HR of 44 bpm, max HR of 193 bpm, and avg HR of 75 bpm. Predominant underlying rhythm was Sinus Rhythm. 3 Supraventricular Tachycardia runs occurred, the run with the fastest interval lasting 7 beats with a max rate of 193 bpm (avg 172  bpm); the run with the fastest interval was also the longest. Isolated SVEs were rare (<1.0%), SVE Couplets were rare (<1.0%), and SVE Triplets were rare (<1.0%). Isolated VEs were rare (<1.0%), and no VE Couplets or VE Triplets were present.    Monitor 2 Patient had a min HR of 41 bpm, max HR of 197 bpm, and avg HR of 75 bpm. Predominant underlying rhythm was Sinus Rhythm. 17 Supraventricular Tachycardia runs occurred, the run with the fastest interval lasting 5 beats  with a max rate of 197 bpm, the  longest lasting 14 beats with an avg rate of 123 bpm. Supraventricular Tachycardia was detected within +/- 45 seconds of symptomatic patient event(s). Isolated SVEs were rare (<1.0%), SVE Couplets were rare (<1.0%), and SVE Triplets were rare (<1.0%).  Isolated VEs were rare (<1.0%), VE Couplets were rare (<1.0%), and no VE Triplets were present. Ventricular Bigeminy was present.    HGwyndolyn Kaufman MD    Past Medical History:  Diagnosis Date   Allergy    seasonal   Ankylosing spondylitis (HCC)    Anxiety    Arthritis    Bell palsy  CAD (coronary artery disease)    a. Coronary CT 10/2016 showed calcium score in the 59th perecntile, moderate CAD in RCA/PDA and narrowing of the mid to distal LAD, with negative FFR.   CTS (carpal tunnel syndrome)    Cystitis    Diabetes mellitus without complication ([place])    Diverticulitis    Diverticulosis of colon    Dry eye syndrome    Exocrine pancreatic insufficiency    Frozen shoulder    GERD (gastroesophageal reflux disease)    HLA B27 (HLA B27 positive)    Hx of colonic polyps    Hyperlipidemia    NMR 2005   Hypertension    Hypothyroidism    Incontinence    Internal hemorrhoids    Interstitial cystitis    Kidney stone    Menopause    Mild aortic insufficiency    Mild ascending aorta dilatation (HCC)    Osteoarthritis    Osteopenia    [facility] [address]   Pre-diabetes    PSVT (paroxysmal supraventricular tachycardia) ([place])    S/P total hysterectomy and bilateral salpingo-oophorectomy 04/12/2014   Sinusitis    Sleep apnea    c-pap   Subjective visual disturbance of both eyes 12/30/2013   Tubular adenoma of colon    Vitamin D deficiency    Xerostomia    and Xeroophthalmia    Past Surgical History:  Procedure Laterality Date   BLADDER SURGERY     for incontinence   CARPAL TUNNEL RELEASE Left 12/22/2014   Procedure: LEFT CARPAL TUNNEL RELEASE;  Surgeon: Daryll Brod, MD;   Location: [facility];  Service: Orthopedics;  Laterality: Left;   CARPAL TUNNEL RELEASE Right 02/18/2019   Procedure: RIGHT CARPAL TUNNEL RELEASE;  Surgeon: Daryll Brod, MD;  Location: [facility];  Service: Orthopedics;  Laterality: Right;   cataract excision Right 2022   COLONOSCOPY W/ POLYPECTOMY     adenomatous poyp 04-2008   colonscopy     tics 10-2002   CYSTOSCOPY WITH RETROGRADE PYELOGRAM, URETEROSCOPY AND STENT PLACEMENT Left 09/01/2013   Procedure: CYSTOSCOPY WITH RETROGRADE PYELOGRAM, AND LEFT STENT PLACEMENT;  Surgeon: Molli Hazard, MD;  Location: [facility];  Service: Urology;  Laterality: Left;   elevated LFTs     due to [place]   FINGER SURGERY Left 2004   2nd finger   G 1 P 1     interstitial cystitis     with clot in catheter   PARTIAL HYSTERECTOMY     with one ovary left   SHOULDER ARTHROSCOPY Left    TOTAL ABDOMINAL HYSTERECTOMY W/ BILATERAL SALPINGOOPHORECTOMY     dysfunctional menses    Current Medications: Current Meds  Medication Sig   acetaminophen (TYLENOL) 500 MG tablet Take 1,000 mg by mouth 3 (three) times daily as needed.    amLODipine (NORVASC) 5 MG tablet Take 1 tablet (5 mg total) by mouth daily.   Ascorbic Acid (VITAMIN C) 1000 MG tablet Take 1,000 mg by mouth 2 (two) times daily.    aspirin 81 MG tablet Take 81 mg by mouth daily.   carvedilol (COREG) 3.125 MG tablet TAKE 1 TABLET BY MOUTH 2 TIMES DAILY.   chlorthalidone (HYGROTON) 25 MG tablet Take 0.5 tablets (12.5 mg total) by mouth daily.   Cholecalciferol (VITAMIN D-3) 25 MCG (1000 UT) CAPS Take 10,000 Units by mouth daily.    diclofenac Sodium (VOLTAREN) 1 % GEL Apply topically 4 (four) times daily as needed.    dicyclomine (  BENTYL) 10 MG capsule Take 1 to 2 tablets three times daily as needed   EPINEPHrine (EPIPEN 2-PAK) 0.3 mg/0.3 mL IJ SOAJ injection Use as directed for severe allergic reactions   HYDROcodone-acetaminophen (NORCO/VICODIN) 5-325 MG tablet  Take 1 tablet by mouth every 6 (six) hours as needed for moderate pain.   lipase/protease/amylase (CREON) 36000 UNITS CPEP capsule Take 2 capsules by mouth with each meal, 1 capsule by mouth with each snack   magnesium oxide (MAG-OX) 400 MG tablet Take 200 mg by mouth daily.   meclizine (ANTIVERT) 25 MG tablet Take 0.5 tablets (12.5 mg total) by mouth 2 (two) times daily as needed for dizziness.   Multiple Vitamins-Minerals (CENTRUM SILVER 50+WOMEN PO) Take 1 tablet by mouth daily.   naloxone (NARCAN) 4 MG/0.1ML LIQD nasal spray kit Narcan 4 mg/actuation nasal spray  PLACE 1 SPRAY INTO EACH NOSTRIL FOR SUSPECTED OPIOID OVERDOSE. MAY REPEAT 2 - 3 MINUTES WITH NEW BOTTLE IN OTHER NOSTRIL   Omega-3 Fatty Acids (FISH OIL BURP-LESS) 1000 MG CAPS Takes 1000 mg BID   omeprazole (PRILOSEC) 40 MG capsule Take 1 capsule (40 mg total) by mouth daily.   ondansetron (ZOFRAN) 4 MG tablet Take 1 tablet (4 mg total) by mouth every 8 (eight) hours as needed.   potassium chloride SA (KLOR-CON M20) 20 MEQ tablet Take 1 tablet (20 mEq total) by mouth daily.   rosuvastatin (CRESTOR) 5 MG tablet Take 1 tablet (5 mg total) by mouth daily.   vitamin B-12 (CYANOCOBALAMIN) 1000 MCG tablet Take 1,000 mcg by mouth 2 (two) times daily.    vitamin E 400 UNIT capsule Take 400 Units by mouth daily.   zinc gluconate 50 MG tablet Take 50 mg by mouth daily.     Allergies:   Bactrim [sulfamethoxazole-trimethoprim], Nitroglycerin, Pentosan polysulfate, Pentosan polysulfate sodium, Promethazine hcl, Sulfamethoxazole-trimethoprim, Losartan, Promethazine hcl, and Lisinopril   Social History   Socioeconomic History   Marital status: Married    Spouse name: Not on file   Number of children: 1   Years of education: BS   Highest education level: Not on file  Occupational History   Occupation: retired   Occupation: Pharmacist, hospital    Comment: Kindergarten  Tobacco Use   Smoking status: Never   Smokeless tobacco: Never  Vaping Use    Vaping Use: Never used  Substance and Sexual Activity   Alcohol use: Yes    Comment: social   Drug use: No   Sexual activity: Not Currently  Other Topics Concern   Not on file  Social History Narrative   Lives at home with husband, is his caregiver   No daily use of caffeine.   Right-handed.         Social Determinants of Health   Financial Resource Strain: Low Risk  (03/17/2022)   Overall Financial Resource Strain (CARDIA)    Difficulty of Paying Living Expenses: Not hard at all  Food Insecurity: No Food Insecurity (03/17/2022)   Hunger Vital Sign    Worried About Running Out of Food in the Last Year: Never true    Ran Out of Food in the Last Year: Never true  Transportation Needs: No Transportation Needs (03/17/2022)   PRAPARE - Hydrologist (Medical): No    Lack of Transportation (Non-Medical): No  Physical Activity: Sufficiently Active (03/17/2022)   Exercise Vital Sign    Days of Exercise per Week: 7 days    Minutes of Exercise per Session: 30 min  Stress:  No Stress Concern Present (03/17/2022)   [place]    Feeling of Stress : Not at all  Social Connections: [place] (03/17/2022)   Social Connection and Isolation Panel [NHANES]    Frequency of Communication with Friends and Family: More than three times a week    Frequency of Social Gatherings with Friends and Family: Once a week    Attends Religious Services: 1 to 4 times per year    Active Member of [employer] or Organizations: Yes    Attends Music therapist: 1 to 4 times per year    Marital Status: Married     Family History:  The patient's family history includes Allergic rhinitis in her sister and sister; Asthma in her mother; Breast cancer in her maternal aunt; Cirrhosis in her father; Colon cancer (age of onset: 91) in her sister; Coronary artery disease in her maternal grandfather and mother;  Dementia in her mother; Diabetes in her father, paternal aunt, and paternal grandmother; Food Allergy in her sister; Hypertension in her mother; Renal cancer in her paternal aunt; Stroke in her mother. There is no history of Esophageal cancer or Stomach cancer.  ROS:   Please see the history of present illness.  All other systems are reviewed and otherwise negative.    EKG(s)/Additional Labs   EKG:  EKG is not ordered today  Recent Labs: 07/26/2021: Magnesium 2.0 01/18/2022: ALT 30; TSH 4.150 02/10/2022: Hemoglobin WILL FOLLOW; Platelets WILL FOLLOW 02/27/2022: BUN 22; Creatinine, Ser 1.08; Potassium 4.0; Sodium 138  Recent Lipid Panel    Component Value Date/Time   CHOL 202 (H) 02/27/2022 0757   TRIG 188 (H) 02/27/2022 0757   TRIG 181 (H) 07/23/2006 1101   HDL 49 02/27/2022 0757   CHOLHDL 4.1 02/27/2022 0757   CHOLHDL 2 06/03/2021 1212   VLDL 24.2 06/03/2021 1212   LDLCALC 120 (H) 02/27/2022 0757   LDLDIRECT 114.0 05/02/2016 0919    PHYSICAL EXAM:    VS:  BP 122/68   Pulse 64   Ht 5' 2"  (1.575 m)   Wt 154 lb (69.9 kg) Comment: per patient, refused to wt  SpO2 96%   BMI 28.17 kg/m   BMI: Body mass index is 28.17 kg/m.  GEN: Well nourished, well developed female in no acute distress HEENT: normocephalic, atraumatic Neck: no JVD, carotid bruits, or masses Cardiac: RRR; no murmurs, rubs, or gallops, no edema  Respiratory:  clear to auscultation bilaterally, normal work of breathing GI: soft, nontender, nondistended, + BS MS: no deformity or atrophy Skin: warm and dry, no rash Neuro:  Alert and Oriented x 3, Strength and sensation are intact, follows commands Psych: euthymic mood, full affect  Wt Readings from Last 3 Encounters:  03/21/22 154 lb (69.9 kg)  03/20/22 154 lb (69.9 kg)  03/02/22 153 lb (69.4 kg)     ASSESSMENT & PLAN:   1. Essential HTN - BP controlled on present regimen. Although it would have been more convenient to try to consolidate/simplify her  meds, we cannot increase amlodipine due to h/o edema, carvedilol due to baseline HR, and chlorthalidone due to borderline creatinine. Since the current regimen is effective, favor continuing. When she returns for her lipid labs in September will trend BMET at that time.  2. Hyperlipidemia - appreciate pharmD lipid clinic input. She is on a re-trial of rosuvastatin with repeat labs and CK next month. Would consider switching to Repatha if CK up again.  3. Aortic  insufficiency - reassess by echo 06/2021. Although she had mild dilation of aorta on the prior study, aortic size was normal in 07/2020. Nevertheless, we'll be able to see the size by repeat echo.  This was a focused visit to f/u BP. See 03/10/22 OV for comprehensive assessment. All other cardiac issues felt to be stable.     Disposition: F/u with me after echo in December 2023/January 2024.   Medication Adjustments/Labs and Tests Ordered: Current medicines are reviewed at length with the patient today.  Concerns regarding medicines are outlined above. Medication changes, Labs and Tests ordered today are summarized above and listed in the Patient Instructions accessible in Encounters.   Signed, Charlie Pitter, PA-C  03/21/2022 10:26 AM    [place] Phone: [phone]; Fax: [phone]

## 2022-03-18 NOTE — Discharge Instructions (Signed)
Please follow-up with your general surgeon to continue to treat the wound on your left buttocks.  Tylenol 1000 mg for pain every 6 hours

## 2022-03-20 ENCOUNTER — Encounter: Payer: Self-pay | Admitting: Internal Medicine

## 2022-03-20 ENCOUNTER — Ambulatory Visit (INDEPENDENT_AMBULATORY_CARE_PROVIDER_SITE_OTHER): Payer: Medicare Other | Admitting: Internal Medicine

## 2022-03-20 DIAGNOSIS — I10 Essential (primary) hypertension: Secondary | ICD-10-CM

## 2022-03-20 DIAGNOSIS — L0231 Cutaneous abscess of buttock: Secondary | ICD-10-CM

## 2022-03-20 DIAGNOSIS — I2510 Atherosclerotic heart disease of native coronary artery without angina pectoris: Secondary | ICD-10-CM | POA: Diagnosis not present

## 2022-03-20 NOTE — Patient Instructions (Addendum)
? ? ? ?  ? ? ?  Medications changes include :  none  ? ? ? ? ? ?Return if symptoms worsen or fail to improve. ? ?

## 2022-03-20 NOTE — Progress Notes (Signed)
Subjective:    Patient ID: [name], female    DOB: [birth year], 76 y.o.   MRN: [id number]     HPI [name] is here for follow up of her buttock abscess  She had discomfort at times and a hard knot in the left buttock still after our last visit.  She used the heating pad.  She did have a drop of blood - no pus.  She had increased pain.  She did see surgery. She had a culture and I &D and had packing placed.  She was not given any antibiotics - no obvious infection.  She was not able to remove the packing due to pain and went to the ED.  She follows up with surgery in 3 days.   Today she had mild drainage - blood/pus.  Not much pain, no fever    Medications and allergies reviewed with patient and updated if appropriate.  Current Outpatient Medications on File Prior to Visit  Medication Sig Dispense Refill   acetaminophen (TYLENOL) 500 MG tablet Take 1,000 mg by mouth 3 (three) times daily as needed.      amLODipine (NORVASC) 5 MG tablet Take 1 tablet (5 mg total) by mouth daily. 90 tablet 3   Ascorbic Acid (VITAMIN C) 1000 MG tablet Take 1,000 mg by mouth 2 (two) times daily.      aspirin 81 MG tablet Take 81 mg by mouth daily.     carvedilol (COREG) 3.125 MG tablet TAKE 1 TABLET BY MOUTH 2 TIMES DAILY. 60 tablet 1   chlorthalidone (HYGROTON) 25 MG tablet Take 0.5 tablets (12.5 mg total) by mouth daily. 30 tablet 1   Cholecalciferol (VITAMIN D-3) 25 MCG (1000 UT) CAPS Take 10,000 Units by mouth daily.      diclofenac Sodium (VOLTAREN) 1 % GEL Apply topically 4 (four) times daily as needed.      dicyclomine (BENTYL) 10 MG capsule Take 1 to 2 tablets three times daily as needed 90 capsule 1   EPINEPHrine (EPIPEN 2-PAK) 0.3 mg/0.3 mL IJ SOAJ injection Use as directed for severe allergic reactions 2 each 1   HYDROcodone-acetaminophen (NORCO/VICODIN) 5-325 MG tablet Take 1 tablet by mouth every 6 (six) hours as needed for moderate pain. 30 tablet 0   lipase/protease/amylase (CREON)  36000 UNITS CPEP capsule Take 2 capsules by mouth with each meal, 1 capsule by mouth with each snack 720 capsule 1   magnesium oxide (MAG-OX) 400 MG tablet Take 200 mg by mouth daily.     meclizine (ANTIVERT) 25 MG tablet Take 0.5 tablets (12.5 mg total) by mouth 2 (two) times daily as needed for dizziness. 30 tablet 3   Multiple Vitamins-Minerals (CENTRUM SILVER 50+WOMEN PO) Take 1 tablet by mouth daily.     naloxone (NARCAN) 4 MG/0.1ML LIQD nasal spray kit Narcan 4 mg/actuation nasal spray  PLACE 1 SPRAY INTO EACH NOSTRIL FOR SUSPECTED OPIOID OVERDOSE. MAY REPEAT 2 - 3 MINUTES WITH NEW BOTTLE IN OTHER NOSTRIL     Omega-3 Fatty Acids (FISH OIL BURP-LESS) 1000 MG CAPS Takes 1000 mg BID 60 capsule 0   omeprazole (PRILOSEC) 40 MG capsule Take 1 capsule (40 mg total) by mouth daily. 90 capsule 4   ondansetron (ZOFRAN) 4 MG tablet Take 1 tablet (4 mg total) by mouth every 8 (eight) hours as needed. 30 tablet 0   potassium chloride SA (KLOR-CON M20) 20 MEQ tablet Take 1 tablet (20 mEq total) by mouth daily. 30 tablet 1  rosuvastatin (CRESTOR) 5 MG tablet Take 1 tablet (5 mg total) by mouth daily. 30 tablet 5   vitamin B-12 (CYANOCOBALAMIN) 1000 MCG tablet Take 1,000 mcg by mouth 2 (two) times daily.      vitamin E 400 UNIT capsule Take 400 Units by mouth daily.     zinc gluconate 50 MG tablet Take 50 mg by mouth daily.     No current facility-administered medications on file prior to visit.     Review of Systems     Objective:   Vitals:   03/20/22 1504  BP: 124/70  Pulse: 60  Temp: 98.4 F (36.9 C)  SpO2: 99%   BP Readings from Last 3 Encounters:  03/20/22 124/70  03/18/22 (!) 138/92  03/02/22 120/72   Wt Readings from Last 3 Encounters:  03/20/22 154 lb (69.9 kg)  03/02/22 153 lb (69.4 kg)  02/10/22 154 lb (69.9 kg)   Body mass index is 28.17 kg/m.    Physical Exam Constitutional:      Appearance: Normal appearance. She is not ill-appearing.  HENT:     Head:  Normocephalic and atraumatic.  Skin:    General: Skin is warm and dry.     Comments: Mid left buttock with pinpoint hold with small amount of pus, mild erythema surrounding - minimal pain with mild palpitation  Neurological:     Mental Status: She is alert.        Lab Results  Component Value Date   WBC WILL FOLLOW 02/10/2022   HGB WILL FOLLOW 02/10/2022   HCT WILL FOLLOW 02/10/2022   PLT WILL FOLLOW 02/10/2022   GLUCOSE 118 (H) 02/27/2022   CHOL 202 (H) 02/27/2022   TRIG 188 (H) 02/27/2022   HDL 49 02/27/2022   LDLDIRECT 114.0 05/02/2016   LDLCALC 120 (H) 02/27/2022   ALT 30 01/18/2022   AST 26 01/18/2022   NA 138 02/27/2022   K 4.0 02/27/2022   CL 101 02/27/2022   CREATININE 1.08 (H) 02/27/2022   BUN 22 02/27/2022   CO2 25 02/27/2022   TSH 4.150 01/18/2022   INR 0.9 01/18/2022   HGBA1C 6.4 (H) 03/02/2022   MICROALBUR 1.7 06/03/2021     Assessment & Plan:    See Problem List for Assessment and Plan of chronic medical problems.

## 2022-03-20 NOTE — Assessment & Plan Note (Signed)
Chronic Controlled Continue amlodipine 2.5 mg daily, coreg 3.125 mg bid

## 2022-03-20 NOTE — Assessment & Plan Note (Signed)
Recurrent She does have some mild erythema, mild discharge Sees surgery in 3 days - since recurrent  - may need to have surgery to remove cyst Will hold off on abx for now unless symptoms get worse since she sees surgery in 3 days - she will call me if her symptoms change

## 2022-03-21 ENCOUNTER — Encounter: Payer: Self-pay | Admitting: Physician Assistant

## 2022-03-21 ENCOUNTER — Ambulatory Visit (INDEPENDENT_AMBULATORY_CARE_PROVIDER_SITE_OTHER): Payer: Medicare Other | Admitting: Physician Assistant

## 2022-03-21 VITALS — BP 122/68 | HR 64 | Ht 62.0 in | Wt 154.0 lb

## 2022-03-21 DIAGNOSIS — I2510 Atherosclerotic heart disease of native coronary artery without angina pectoris: Secondary | ICD-10-CM | POA: Diagnosis not present

## 2022-03-21 DIAGNOSIS — I351 Nonrheumatic aortic (valve) insufficiency: Secondary | ICD-10-CM

## 2022-03-21 DIAGNOSIS — E785 Hyperlipidemia, unspecified: Secondary | ICD-10-CM | POA: Diagnosis not present

## 2022-03-21 DIAGNOSIS — I10 Essential (primary) hypertension: Secondary | ICD-10-CM

## 2022-03-21 DIAGNOSIS — R42 Dizziness and giddiness: Secondary | ICD-10-CM

## 2022-03-21 DIAGNOSIS — I77810 Thoracic aortic ectasia: Secondary | ICD-10-CM

## 2022-03-21 DIAGNOSIS — I471 Supraventricular tachycardia: Secondary | ICD-10-CM

## 2022-03-21 NOTE — Patient Instructions (Addendum)
Medication Instructions:  Your physician recommends that you continue on your current medications as directed. Please refer to the Current Medication list given to you today. *If you need a refill on your cardiac medications before your next appointment, please call your pharmacy*   Lab Work: Adding BMET to scheduled labs in September If you have labs (blood work) drawn today and your tests are completely normal, you will receive your results only by: [facility] (if you have MyChart) OR A paper copy in the mail If you have any lab test that is abnormal or we need to change your treatment, we will call you to review the results.   Testing/Procedures: Your physician has requested that you have an echocardiogram. Echocardiography is a painless test that uses sound waves to create images of your heart. It provides your doctor with information about the size and shape of your heart and how well your heart's chambers and valves are working. This procedure takes approximately one hour. There are no restrictions for this procedure. SCHEDULE YOUR ECHO FOR DECEMBER 2023  Follow-Up: At [facility], you and your health needs are our priority.  As part of our continuing mission to provide you with exceptional heart care, we have created designated Provider Care Teams.  These Care Teams include your primary Cardiologist (physician) and Advanced Practice Providers (APPs -  Physician Assistants and Nurse Practitioners) who all work together to provide you with the care you need, when you need it.  We recommend signing up for the patient portal called "MyChart".  Sign up information is provided on this After Visit Summary.  MyChart is used to connect with patients for Virtual Visits (Telemedicine).  Patients are able to view lab/test results, encounter notes, upcoming appointments, etc.  Non-urgent messages can be sent to your provider as well.   To learn more about what you can do with MyChart, go to  [email or web address].    Your next appointment:   AFTER ECHO IN JANUARY 2024  The format for your next appointment:   In Person  Provider:   Melina Copa, PA-C        Other Instructions   Important Information About Sugar

## 2022-04-01 ENCOUNTER — Other Ambulatory Visit: Payer: Self-pay | Admitting: Physician Assistant

## 2022-04-10 ENCOUNTER — Other Ambulatory Visit: Payer: Self-pay | Admitting: Physician Assistant

## 2022-04-11 MED ORDER — POTASSIUM CHLORIDE CRYS ER 20 MEQ PO TBCR: 20.0000 meq | EXTENDED_RELEASE_TABLET | Freq: Every day | ORAL | 3 refills | Status: DC

## 2022-04-16 ENCOUNTER — Other Ambulatory Visit: Payer: Self-pay | Admitting: Physician Assistant

## 2022-04-17 ENCOUNTER — Other Ambulatory Visit: Payer: Self-pay | Admitting: Internal Medicine

## 2022-04-17 ENCOUNTER — Ambulatory Visit: Payer: Medicare Other | Attending: Cardiology

## 2022-04-17 ENCOUNTER — Ambulatory Visit: Payer: Medicare Other

## 2022-04-17 ENCOUNTER — Other Ambulatory Visit (INDEPENDENT_AMBULATORY_CARE_PROVIDER_SITE_OTHER): Payer: Medicare Other

## 2022-04-17 DIAGNOSIS — R21 Rash and other nonspecific skin eruption: Secondary | ICD-10-CM | POA: Diagnosis not present

## 2022-04-17 DIAGNOSIS — E785 Hyperlipidemia, unspecified: Secondary | ICD-10-CM | POA: Insufficient documentation

## 2022-04-17 DIAGNOSIS — Z23 Encounter for immunization: Secondary | ICD-10-CM

## 2022-04-17 DIAGNOSIS — I351 Nonrheumatic aortic (valve) insufficiency: Secondary | ICD-10-CM | POA: Insufficient documentation

## 2022-04-17 DIAGNOSIS — I10 Essential (primary) hypertension: Secondary | ICD-10-CM | POA: Insufficient documentation

## 2022-04-17 DIAGNOSIS — E782 Mixed hyperlipidemia: Secondary | ICD-10-CM | POA: Diagnosis not present

## 2022-04-17 LAB — C-REACTIVE PROTEIN: CRP: <1 mg/dL (ref 0.5–20.0)

## 2022-04-17 LAB — COMPREHENSIVE METABOLIC PANEL
ALT: 17 U/L (ref 0–35)
AST: 21 U/L (ref 0–37)
Albumin: 4.1 g/dL (ref 3.5–5.2)
Alkaline Phosphatase: 95 U/L (ref 39–117)
BUN: 22 mg/dL (ref 6–23)
CO2: 28 mEq/L (ref 19–32)
Calcium: 9.7 mg/dL (ref 8.4–10.5)
Chloride: 101 mEq/L (ref 96–112)
Creatinine, Ser: 1 mg/dL (ref 0.40–1.20)
GFR: 54.08 mL/min — ABNORMAL LOW (ref >60.00)
Glucose, Bld: 99 mg/dL (ref 70–99)
Potassium: 3.6 mEq/L (ref 3.5–5.1)
Sodium: 138 mEq/L (ref 135–145)
Total Bilirubin: 0.4 mg/dL (ref 0.2–1.2)
Total Protein: 7.5 g/dL (ref 6.0–8.3)

## 2022-04-17 LAB — BASIC METABOLIC PANEL
BUN/Creatinine Ratio: 22 (ref 12–28)
BUN: 21 mg/dL (ref 8–27)
CO2: 27 mmol/L (ref 20–29)
Calcium: 9.9 mg/dL (ref 8.7–10.3)
Chloride: 100 mmol/L (ref 96–106)
Creatinine, Ser: 0.95 mg/dL (ref 0.57–1.00)
Glucose: 109 mg/dL — ABNORMAL HIGH (ref 70–99)
Potassium: 3.8 mmol/L (ref 3.5–5.2)
Sodium: 141 mmol/L (ref 134–144)
eGFR: 61 mL/min/{1.73_m2} (ref >59)

## 2022-04-17 LAB — CBC WITH DIFFERENTIAL/PLATELET
Basophils Absolute: 0 10*3/uL (ref 0.0–0.1)
Basophils Relative: 0.8 % (ref 0.0–3.0)
Eosinophils Absolute: 0.2 10*3/uL (ref 0.0–0.7)
Eosinophils Relative: 2.8 % (ref 0.0–5.0)
HCT: 33.5 % — ABNORMAL LOW (ref 36.0–46.0)
Hemoglobin: 11.4 g/dL — ABNORMAL LOW (ref 12.0–15.0)
Lymphocytes Relative: 34 % (ref 12.0–46.0)
Lymphs Abs: 1.9 10*3/uL (ref 0.7–4.0)
MCHC: 34 g/dL (ref 30.0–36.0)
MCV: 85 fl (ref 78.0–100.0)
Monocytes Absolute: 0.4 10*3/uL (ref 0.1–1.0)
Monocytes Relative: 7.6 % (ref 3.0–12.0)
Neutro Abs: 3.1 10*3/uL (ref 1.4–7.7)
Neutrophils Relative %: 54.8 % (ref 43.0–77.0)
Platelets: 276 10*3/uL (ref 150.0–400.0)
RBC: 3.94 Mil/uL (ref 3.87–5.11)
RDW: 14.3 % (ref 11.5–15.5)
WBC: 5.7 10*3/uL (ref 4.0–10.5)

## 2022-04-17 LAB — HEPATIC FUNCTION PANEL
ALT: 17 IU/L (ref 0–32)
AST: 25 IU/L (ref 0–40)
Albumin: 4.4 g/dL (ref 3.8–4.8)
Alkaline Phosphatase: 108 IU/L (ref 44–121)
Bilirubin Total: 0.3 mg/dL (ref 0.0–1.2)
Bilirubin, Direct: 0.12 mg/dL (ref 0.00–0.40)
Total Protein: 7.2 g/dL (ref 6.0–8.5)

## 2022-04-17 LAB — CK: Total CK: 220 U/L — ABNORMAL HIGH (ref 32–182)

## 2022-04-17 LAB — LIPID PANEL
Chol/HDL Ratio: 2.1 ratio (ref 0.0–4.4)
Cholesterol, Total: 123 mg/dL (ref 100–199)
HDL: 60 mg/dL (ref >39)
LDL Chol Calc (NIH): 46 mg/dL (ref 0–99)
Triglycerides: 91 mg/dL (ref 0–149)
VLDL Cholesterol Cal: 17 mg/dL (ref 5–40)

## 2022-04-17 LAB — SEDIMENTATION RATE: Sed Rate: 34 mm/hr — ABNORMAL HIGH (ref 0–30)

## 2022-04-17 NOTE — Progress Notes (Signed)
After obtaining consent, and per orders of Dr. Quay Burow, injection of high dose flu given by Max Sane. Patient instructed to remain in clinic for 20 minutes afterwards, and to report any adverse reaction to me immediately.

## 2022-04-18 LAB — ANA: Anti Nuclear Antibody (ANA): NEGATIVE

## 2022-04-20 ENCOUNTER — Other Ambulatory Visit: Payer: Self-pay

## 2022-04-20 MED ORDER — AMLODIPINE BESYLATE 5 MG PO TABS: 5.0000 mg | ORAL_TABLET | Freq: Every day | ORAL | 3 refills | Status: DC

## 2022-04-20 NOTE — Telephone Encounter (Signed)
Pt's medication was sent to pt's pharmacy as requested. Confirmation received.  °

## 2022-04-26 ENCOUNTER — Ambulatory Visit (INDEPENDENT_AMBULATORY_CARE_PROVIDER_SITE_OTHER): Payer: Medicare Other | Admitting: Internal Medicine

## 2022-04-26 ENCOUNTER — Encounter: Payer: Self-pay | Admitting: Internal Medicine

## 2022-04-26 VITALS — BP 130/76 | HR 76 | Ht 62.0 in | Wt 154.0 lb

## 2022-04-26 DIAGNOSIS — R110 Nausea: Secondary | ICD-10-CM | POA: Diagnosis not present

## 2022-04-26 DIAGNOSIS — K589 Irritable bowel syndrome without diarrhea: Secondary | ICD-10-CM

## 2022-04-26 DIAGNOSIS — I2510 Atherosclerotic heart disease of native coronary artery without angina pectoris: Secondary | ICD-10-CM

## 2022-04-26 DIAGNOSIS — Z86010 Personal history of colonic polyps: Secondary | ICD-10-CM

## 2022-04-26 DIAGNOSIS — K219 Gastro-esophageal reflux disease without esophagitis: Secondary | ICD-10-CM | POA: Diagnosis not present

## 2022-04-26 DIAGNOSIS — Z800 Family history of malignant neoplasm of digestive organs: Secondary | ICD-10-CM | POA: Diagnosis not present

## 2022-04-26 MED ORDER — ONDANSETRON HCL 4 MG PO TABS: 4.0000 mg | ORAL_TABLET | Freq: Three times a day (TID) | ORAL | 1 refills | Status: DC | PRN

## 2022-04-26 NOTE — Patient Instructions (Signed)
If you are age 78 or older, your body mass index should be between 23-30. Your Body mass index is 28.17 kg/m. If this is out of the aforementioned range listed, please consider follow up with your Primary Care Provider.  If you are age 55 or younger, your body mass index should be between 19-25. Your Body mass index is 28.17 kg/m. If this is out of the aformentioned range listed, please consider follow up with your Primary Care Provider.   Follow up in 6 months.  The [facility] providers would like to encourage you to use [facility] to communicate with providers for non-urgent requests or questions.  Due to long hold times on the telephone, sending your provider a message by [facility] may be a faster and more efficient way to get a response.  Please allow 48 business hours for a response.  Please remember that this is for non-urgent requests.   It was a pleasure to see you today!  Thank you for trusting me with your gastrointestinal care!

## 2022-04-26 NOTE — Progress Notes (Signed)
   Subjective:    Patient ID: [name], female    DOB: 08-11-1945, 76 y.o.   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  She is here alone today and was last seen on 01/24/2022.  She reports she has been doing quite well.  She has had 2 episodes of rectal bleeding.  This was associated with a painful larger bowel movement.  She has used Colace on occasion.  She is only had 1 episode of loose stool and has not needed Creon or Bentyl.  She does intermittently have nausea but none recently.  She does request a refill of her Zofran.  From a reflux perspective she is been off of omeprazole now for some time and has not had frequent indigestion or heartburn.  Occasionally there are some dietary triggers for indigestion but she is able to avoid this with diet modification.   Review of Systems As per HPI, otherwise negative  Current Medications, Allergies, Past Medical History, Past Surgical History, Family History and Social History were reviewed in [employer] record.     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  IBS with abdominal cramping/prior mild EPI --she is doing very well of late and has not needed Creon nor Bentyl.  She can use these on an as-needed basis but fortunately has not needed to do so recently -- Monitor for recurrence and treat symptomatically  2.  History of GERD and globus --she has weaned off of omeprazole and is currently  doing well.  She can remain off therapy for now.  3.  History of low B12 --- she is responded to oral supplementation, levels now normal  4.  History of colon polyps and family history of colon cancer --surveillance colonoscopy around July 2024  5.  Rare nausea --okay for ondansetron 4 mg every 8 hours on an as-needed basis  30 minutes total spent today including patient facing time, coordination of care, reviewing medical history/procedures/pertinent radiology studies, and documentation of the encounter.

## 2022-05-01 ENCOUNTER — Other Ambulatory Visit: Payer: Self-pay | Admitting: Internal Medicine

## 2022-05-01 DIAGNOSIS — Z1231 Encounter for screening mammogram for malignant neoplasm of breast: Secondary | ICD-10-CM

## 2022-05-03 ENCOUNTER — Telehealth: Payer: Self-pay | Admitting: Physician Assistant

## 2022-05-03 NOTE — Telephone Encounter (Signed)
Pt c/o medication issue:  1. Name of Medication: Klor- Con  2. How are you currently taking this medication (dosage and times per day)? 1 time a day  3. Are you having a reaction (difficulty breathing--STAT)?   4. What is your medication issue? Bad constipation, and red rash that comes and goes on her ankles and lower legs, abd pain, and more lightheaded than usuale

## 2022-05-03 NOTE — Telephone Encounter (Signed)
I would recommend she see her PCP for evaluation of these symptoms. I would not expect this reaction with potassium. 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 Often times primary care can biopsy the rash for more conclusive information. Can refer to derm but this can take months so would start with formal PCP OV and let us know from there.

## 2022-05-03 NOTE — Telephone Encounter (Signed)
Patient reports a rash on her lower legs/ankles that comes and goes away.  It is flat, red, not itchy, patchy.  She showed her husbands primary care provider on 04/17/22 and she didn't know what it might be from.  She recently mowed the grass and saw it later that day after bathing.  She thinks it first appeared around mid September. She just saw GI a couple days ago and told him everything was fine.  She has had constipation alternating w loose in the past but this worse. Not taking hydrocodone.  I asked her to increase fiber and water and let Dr. Hilarie Fredrickson know.  She is using colace.    I told her I would let check with Dayna and PharmD as to whether these can be possible side effects of potassium and if there are any new recommendations we will call her back.

## 2022-05-03 NOTE — Telephone Encounter (Signed)
Called patient back and provided recommendations from APP.

## 2022-05-03 NOTE — Telephone Encounter (Signed)
Call placed to patient.  Unable to leave message as mailbox is full.

## 2022-05-08 DIAGNOSIS — Z1231 Encounter for screening mammogram for malignant neoplasm of breast: Secondary | ICD-10-CM | POA: Diagnosis not present

## 2022-05-08 LAB — HM MAMMOGRAPHY

## 2022-05-15 ENCOUNTER — Other Ambulatory Visit: Payer: Self-pay | Admitting: Physician Assistant

## 2022-05-29 ENCOUNTER — Encounter: Payer: Self-pay | Admitting: Internal Medicine

## 2022-06-01 ENCOUNTER — Ambulatory Visit: Payer: Medicare Other | Admitting: Internal Medicine

## 2022-06-26 DIAGNOSIS — M545 Low back pain, unspecified: Secondary | ICD-10-CM | POA: Diagnosis not present

## 2022-07-03 ENCOUNTER — Other Ambulatory Visit (HOSPITAL_BASED_OUTPATIENT_CLINIC_OR_DEPARTMENT_OTHER): Payer: Self-pay

## 2022-07-03 MED ORDER — FLUOROMETHOLONE 0.1 % OP SUSP
OPHTHALMIC | 1 refills | Status: DC
  Filled 2022-07-03: qty 5, 14d supply, fill #0

## 2022-07-04 ENCOUNTER — Other Ambulatory Visit (HOSPITAL_BASED_OUTPATIENT_CLINIC_OR_DEPARTMENT_OTHER): Payer: Self-pay

## 2022-07-04 DIAGNOSIS — M47816 Spondylosis without myelopathy or radiculopathy, lumbar region: Secondary | ICD-10-CM | POA: Diagnosis not present

## 2022-07-06 ENCOUNTER — Other Ambulatory Visit (HOSPITAL_COMMUNITY): Payer: Medicare Other

## 2022-07-10 DIAGNOSIS — M48061 Spinal stenosis, lumbar region without neurogenic claudication: Secondary | ICD-10-CM | POA: Diagnosis not present

## 2022-07-10 DIAGNOSIS — M461 Sacroiliitis, not elsewhere classified: Secondary | ICD-10-CM | POA: Diagnosis not present

## 2022-07-10 DIAGNOSIS — M810 Age-related osteoporosis without current pathological fracture: Secondary | ICD-10-CM | POA: Diagnosis not present

## 2022-07-10 DIAGNOSIS — G894 Chronic pain syndrome: Secondary | ICD-10-CM | POA: Diagnosis not present

## 2022-07-10 DIAGNOSIS — M47896 Other spondylosis, lumbar region: Secondary | ICD-10-CM | POA: Diagnosis not present

## 2022-07-10 DIAGNOSIS — M159 Polyosteoarthritis, unspecified: Secondary | ICD-10-CM | POA: Diagnosis not present

## 2022-07-16 ENCOUNTER — Encounter: Payer: Self-pay | Admitting: Emergency Medicine

## 2022-07-16 ENCOUNTER — Ambulatory Visit
Admission: EM | Admit: 2022-07-16 | Discharge: 2022-07-16 | Disposition: A | Discharge status: Home / Self Care | Payer: Medicare Other | Attending: Family Medicine | Admitting: Family Medicine

## 2022-07-16 ENCOUNTER — Other Ambulatory Visit: Payer: Self-pay

## 2022-07-16 DIAGNOSIS — J0100 Acute maxillary sinusitis, unspecified: Secondary | ICD-10-CM | POA: Diagnosis not present

## 2022-07-16 MED ORDER — AMOXICILLIN 875 MG PO TABS: 875.0000 mg | ORAL_TABLET | Freq: Two times a day (BID) | ORAL | 0 refills | Status: AC

## 2022-07-16 NOTE — ED Triage Notes (Signed)
Patient presents to Urgent Care with complaints of nasal congestion, sore throat, postnasal drainage since 3 weeks ago. Patient reports trying to work on it herself. She has sores in her nostrils. She was prescribed a topical/ointment for it before. Marland Kitchen

## 2022-07-16 NOTE — Discharge Instructions (Addendum)
Advised patient to take medication as directed with food to completion.  Encouraged patient to increase daily water intake while taking this medication.  Advised if symptoms worsen and/or unresolved please follow-up with PCP or here for further evaluation.

## 2022-07-16 NOTE — ED Provider Notes (Signed)
[facility]    CSN: [id number] Arrival date & time: 07/16/22  1514      History   Chief Complaint Chief Complaint  Patient presents with   Abscess    In nostrils    HPI [name] is a 76 y.o. female.   HPI 76 year old female presents with nasal congestion, sore throat, postnasal drainage for 3 weeks.  Reports that she has sores or abscesses in both nostrils.  PMH significant for HTN, interstitial cystitis, and ankylosing spondylitis  Past Medical History:  Diagnosis Date   Allergy    seasonal   Ankylosing spondylitis (HCC)    Anxiety    Arthritis    Bell palsy    CAD (coronary artery disease)    a. Coronary CT 10/2016 showed calcium score in the 59th perecntile, moderate CAD in RCA/PDA and narrowing of the mid to distal LAD, with negative FFR.   CTS (carpal tunnel syndrome)    Cystitis    Diabetes mellitus without complication ([place])    Diverticulitis    Diverticulosis of colon    Dry eye syndrome    Exocrine pancreatic insufficiency    Frozen shoulder    GERD (gastroesophageal reflux disease)    HLA B27 (HLA B27 positive)    Hx of colonic polyps    Hyperlipidemia    NMR 2005   Hypertension    Hypothyroidism    Incontinence    Internal hemorrhoids    Interstitial cystitis    Kidney stone    Menopause    Mild aortic insufficiency    Mild ascending aorta dilatation (HCC)    Osteoarthritis    Osteopenia    [facility] [address]   Pre-diabetes    PSVT (paroxysmal supraventricular tachycardia)    S/P total hysterectomy and bilateral salpingo-oophorectomy 04/12/2014   Sinusitis    Sleep apnea    c-pap   Subjective visual disturbance of both eyes 12/30/2013   Tubular adenoma of colon    Vitamin D deficiency    Xerostomia    and Xeroophthalmia    Patient Active Problem List   Diagnosis Date Noted   Left buttock abscess 01/06/2022   Trapezius muscle spasm 07/08/2021   Tongue coating 07/08/2021   Gastroenteritis 06/03/2021    Left foot pain 02/03/2021   Degenerative arthritis of knee, bilateral 11/24/2020   Right ankle effusion 05/10/2020   Elevated alkaline phosphatase level 04/20/2020   Neuralgia 04/20/2020   Swelling 12/19/2018   Hair loss 11/04/2018   Spinal stenosis, 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/13/2018   Atypical chest pain 05/21/2018   Epigastric pain 05/21/2018   Baker's cyst of knee, right 05/07/2018   Perennial and seasonal allergic rhinitis 04/18/2018   Onychomycosis of toenail 03/04/2018   Hypercalcemia 09/17/2017   Left wrist pain 09/17/2017   Synovitis of left knee 05/22/2017   Diabetes ([place]) 05/02/2017   Trigger point of left shoulder region 03/27/2017   Taste disorder 02/22/2017   Anxiety and depression 02/12/2017   Caregiver with fatigue 01/17/2017   Psychosocial stressors 01/17/2017   Decreased sense of taste 01/15/2017   Family history of osteoporosis 11/16/2016   Atherosclerotic plaque 11/07/2016   Chronic or recurrent subluxation of peroneal tendon of left foot 09/13/2016   Osteoporosis 06/21/2016   Dizziness 05/16/2016   Arthritis of left shoulder region 01/17/2016   Ankylosing spondylitis ([place]) 11/08/2015   Incontinence 10/28/2015   Thoracic back pain 10/19/2015   Leg cramping 09/29/2015   Primary osteoarthritis of  left shoulder 08/03/2015   Impingement syndrome of left shoulder region 09/22/2014   Interstitial cystitis 07/09/2014   Diverticulosis of colon without hemorrhage 07/08/2014   Carpal tunnel syndrome of left wrist 04/12/2014   Food allergy 03/06/2014   Renal calculi 01/30/2014   OSA (obstructive sleep apnea), moderate 01/14/2014   Allergic conjunctivitis 12/06/2013   History of Bell's palsy 02/26/2012   Vitamin D deficiency 06/27/2010   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   DRY MOUTH 06/27/2010   Gastroesophageal reflux disease 04/30/2009   History of colonic polyps 04/30/2009   Hyperlipidemia 08/21/2007   Essential hypertension 08/21/2007    Polyarthralgia 08/21/2007   Chronic lumbar radiculopathy 01/25/2007    Past Surgical History:  Procedure Laterality Date   BLADDER SURGERY     for incontinence   CARPAL TUNNEL RELEASE Left 12/22/2014   Procedure: LEFT CARPAL TUNNEL RELEASE;  Surgeon: Daryll Brod, MD;  Location: [facility];  Service: Orthopedics;  Laterality: Left;   CARPAL TUNNEL RELEASE Right 02/18/2019   Procedure: RIGHT CARPAL TUNNEL RELEASE;  Surgeon: Daryll Brod, MD;  Location: [facility];  Service: Orthopedics;  Laterality: Right;   cataract excision Right 2022   COLONOSCOPY W/ POLYPECTOMY     adenomatous poyp 04-2008   colonscopy     tics 10-2002   CYSTOSCOPY WITH RETROGRADE PYELOGRAM, URETEROSCOPY AND STENT PLACEMENT Left 09/01/2013   Procedure: CYSTOSCOPY WITH RETROGRADE PYELOGRAM, AND LEFT STENT PLACEMENT;  Surgeon: Molli Hazard, MD;  Location: [facility];  Service: Urology;  Laterality: Left;   elevated LFTs     due to [place]   FINGER SURGERY Left 2004   2nd finger   G 1 P 1     interstitial cystitis     with clot in catheter   PARTIAL HYSTERECTOMY     with one ovary left   SHOULDER ARTHROSCOPY Left    TOTAL ABDOMINAL HYSTERECTOMY W/ BILATERAL SALPINGOOPHORECTOMY     dysfunctional menses    OB History   No obstetric history on file.      Home Medications    Prior to Admission medications   Medication Sig Start Date End Date Taking? Authorizing Provider  acetaminophen (TYLENOL) 500 MG tablet Take 1,000 mg by mouth 3 (three) times daily as needed.    Yes [provider]  amLODipine (NORVASC) 5 MG tablet Take 1 tablet (5 mg total) by mouth daily. 04/20/22 04/15/23 Yes Dunn, Dayna N, PA-C  amoxicillin (AMOXIL) 875 MG tablet Take 1 tablet (875 mg total) by mouth 2 (two) times daily for 7 days. 07/16/22 07/23/22 Yes Eliezer Lofts, FNP  Ascorbic Acid (VITAMIN C) 1000 MG tablet Take 1,000 mg by mouth 2 (two) times daily.    Yes [provider]   aspirin 81 MG tablet Take 81 mg by mouth daily.   Yes [provider]  carvedilol (COREG) 3.125 MG tablet TAKE 1 TABLET BY MOUTH TWICE A DAY 04/04/22  Yes Dunn, Dayna N, PA-C  chlorthalidone (HYGROTON) 25 MG tablet TAKE 1 TABLET (25 MG TOTAL) BY MOUTH DAILY. 05/16/22  Yes Dunn, Nedra Hai, PA-C  Cholecalciferol (VITAMIN D-3) 25 MCG (1000 UT) CAPS Take 10,000 Units by mouth daily.    Yes [provider]  diclofenac Sodium (VOLTAREN) 1 % GEL Apply topically 4 (four) times daily as needed.    Yes [provider]  dicyclomine (BENTYL) 10 MG capsule Take 1 to 2 tablets three times daily as needed 01/24/22  Yes Pyrtle, Lajuan Lines, MD  EPINEPHrine [facility]  2-PAK) 0.3 mg/0.3 mL IJ SOAJ injection Use as directed for severe allergic reactions 01/06/22  Yes Burns, Claudina Lick, MD  fluorometholone (FML) 0.1 % ophthalmic suspension INSTILL 1 DROP INTO EACH EYE TWICE DAILY FOR 2 WEEKS 07/03/22  Yes   HYDROcodone-acetaminophen (NORCO/VICODIN) 5-325 MG tablet Take 1 tablet by mouth every 6 (six) hours as needed for moderate pain. 01/10/22  Yes Burns, Claudina Lick, MD  lipase/protease/amylase (CREON) 36000 UNITS CPEP capsule Take 2 capsules by mouth with each meal, 1 capsule by mouth with each snack 07/08/21  Yes Esterwood, Amy S, PA-C  magnesium oxide (MAG-OX) 400 MG tablet Take 200 mg by mouth daily.   Yes [provider]  meclizine (ANTIVERT) 25 MG tablet Take 0.5 tablets (12.5 mg total) by mouth 2 (two) times daily as needed for dizziness. 01/19/22  Yes Dunn, Dayna N, PA-C  Multiple Vitamins-Minerals (CENTRUM SILVER 50+WOMEN PO) Take 1 tablet by mouth daily.   Yes [provider]  naloxone (NARCAN) 4 MG/0.1ML LIQD nasal spray kit Narcan 4 mg/actuation nasal spray  PLACE 1 SPRAY INTO EACH NOSTRIL FOR SUSPECTED OPIOID OVERDOSE. MAY REPEAT 2 - 3 MINUTES WITH NEW BOTTLE IN OTHER NOSTRIL   Yes [provider]  Omega-3 Fatty Acids (FISH OIL BURP-LESS) 1000 MG CAPS Takes 1000 mg BID 01/03/16   Yes Esterwood, Amy S, PA-C  ondansetron (ZOFRAN) 4 MG tablet Take 1 tablet (4 mg total) by mouth every 8 (eight) hours as needed. 04/26/22  Yes Pyrtle, Lajuan Lines, MD  potassium chloride SA (KLOR-CON M20) 20 MEQ tablet Take 1 tablet (20 mEq total) by mouth daily. 04/11/22  Yes Dunn, Dayna N, PA-C  rosuvastatin (CRESTOR) 5 MG tablet Take 1 tablet (5 mg total) by mouth daily. 03/10/22  Yes Freada Bergeron, MD  vitamin B-12 (CYANOCOBALAMIN) 1000 MCG tablet Take 1,000 mcg by mouth 2 (two) times daily.    Yes [provider]  vitamin E 400 UNIT capsule Take 400 Units by mouth daily.   Yes [provider]  zinc gluconate 50 MG tablet Take 50 mg by mouth daily.   Yes [provider]  omeprazole (PRILOSEC) 40 MG capsule Take 1 capsule (40 mg total) by mouth daily. 07/08/21 07/08/22  Esterwood, Amy Chauncey Cruel, PA-C    Family History Family History  Problem Relation Age of Onset   Coronary artery disease Mother    Hypertension Mother    Asthma Mother    Dementia Mother    Stroke Mother        mini cva   Diabetes Father    Cirrhosis Father        non alcoholic   Colon cancer Sister 104   Allergic rhinitis Sister    Allergic rhinitis Sister    Food Allergy Sister    Renal cancer Paternal Aunt        x 2   Coronary artery disease Maternal Grandfather    Breast cancer Maternal Aunt    Diabetes Paternal Aunt    Diabetes Paternal Grandmother    Esophageal cancer Neg Hx    Stomach cancer Neg Hx     Social History Social History   Tobacco Use   Smoking status: Never   Smokeless tobacco: Never  Vaping Use   Vaping Use: Never used  Substance Use Topics   Alcohol use: Yes    Comment: social   Drug use: No     Allergies   Bactrim [sulfamethoxazole-trimethoprim], Nitroglycerin, Pentosan polysulfate, Pentosan polysulfate sodium, Promethazine hcl, Sulfamethoxazole-trimethoprim, Losartan,  Promethazine hcl, and Lisinopril   Review of Systems Review of Systems  HENT:   Positive for congestion and sore throat.   All other systems reviewed and are negative.    Physical Exam Triage Vital Signs ED Triage Vitals  Enc Vitals Group     BP 07/16/22 1536 108/72     Pulse Rate 07/16/22 1536 70     Resp 07/16/22 1536 16     Temp 07/16/22 1536 98 F (36.7 C)     Temp Source 07/16/22 1536 Oral     SpO2 07/16/22 1536 97 %     Weight --      Height --      Head Circumference --      Peak Flow --      Pain Score 07/16/22 1534 3     Pain Loc --      Pain Edu? --      Excl. in [place]? --    No data found.  Updated Vital Signs BP 108/72 (BP Location: Left Arm)   Pulse 70   Temp 98 F (36.7 C) (Oral)   Resp 16   SpO2 97%       Physical Exam Vitals and nursing note reviewed.  Constitutional:      General: She is not in acute distress.    Appearance: Normal appearance. She is normal weight. She is not ill-appearing.  HENT:     Head: Normocephalic and atraumatic.     Right Ear: Tympanic membrane and external ear normal.     Left Ear: Tympanic membrane and external ear normal.     Ears:     Comments: Moderate eustachian tube dysfunction noted bilaterally    Nose:     Comments: Erythematous/edematous, no abscess noted    Mouth/Throat:     Mouth: Mucous membranes are moist.     Pharynx: Oropharynx is clear.  Eyes:     Extraocular Movements: Extraocular movements intact.     Conjunctiva/sclera: Conjunctivae normal.     Pupils: Pupils are equal, round, and reactive to light.  Cardiovascular:     Rate and Rhythm: Normal rate and regular rhythm.     Pulses: Normal pulses.  Pulmonary:     Effort: Pulmonary effort is normal.     Breath sounds: Normal breath sounds. No wheezing, rhonchi or rales.  Musculoskeletal:        General: Normal range of motion.     Cervical back: Normal range of motion and neck supple.  Skin:    General: Skin is warm and dry.  Neurological:     General: No focal deficit present.     Mental Status: She is alert and oriented  to person, place, and time. Mental status is at baseline.      UC Treatments / Results  Labs (all labs ordered are listed, but only abnormal results are displayed) Labs Reviewed - No data to display  EKG   Radiology No results found.  Procedures Procedures (including critical care time)  Medications Ordered in UC Medications - No data to display  Initial Impression / Assessment and Plan / UC Course  I have reviewed the triage vital signs and the nursing notes.  Pertinent labs & imaging results that were available during my care of the patient were reviewed by me and considered in my medical decision making (see chart for details).     MDM: 1.  Subacute maxillary sinusitis-Rx'd Amoxicillin. Advised patient to take medication as directed with food to  completion.  Encouraged patient to increase daily water intake while taking this medication.  Advised if symptoms worsen and/or unresolved please follow-up with PCP or here for further evaluation.  Final Clinical Impressions(s) / UC Diagnoses   Final diagnoses:  Acute maxillary sinusitis, recurrence not specified     Discharge Instructions      Advised patient to take medication as directed with food to completion.  Encouraged patient to increase daily water intake while taking this medication.  Advised if symptoms worsen and/or unresolved please follow-up with PCP or here for further evaluation.     ED Prescriptions     Medication Sig Dispense Auth. Provider   amoxicillin (AMOXIL) 875 MG tablet Take 1 tablet (875 mg total) by mouth 2 (two) times daily for 7 days. 14 tablet Eliezer Lofts, FNP      PDMP not reviewed this encounter.   Eliezer Lofts, [place] 07/16/22 1636

## 2022-07-17 ENCOUNTER — Other Ambulatory Visit: Payer: Self-pay | Admitting: Physician Assistant

## 2022-07-21 ENCOUNTER — Ambulatory Visit (HOSPITAL_BASED_OUTPATIENT_CLINIC_OR_DEPARTMENT_OTHER)
Admission: RE | Admit: 2022-07-21 | Discharge: 2022-07-21 | Disposition: A | Discharge status: Home / Self Care | Payer: Medicare Other | Source: Ambulatory Visit | Attending: Physician Assistant | Admitting: Physician Assistant

## 2022-07-21 ENCOUNTER — Other Ambulatory Visit: Payer: Self-pay | Admitting: Physician Assistant

## 2022-07-21 DIAGNOSIS — I351 Nonrheumatic aortic (valve) insufficiency: Secondary | ICD-10-CM

## 2022-07-21 DIAGNOSIS — I10 Essential (primary) hypertension: Secondary | ICD-10-CM

## 2022-07-21 DIAGNOSIS — E785 Hyperlipidemia, unspecified: Secondary | ICD-10-CM

## 2022-07-21 LAB — ECHOCARDIOGRAM COMPLETE
Area-P 1/2: 3.53 cm2
P 1/2 time: 571 msec
S' Lateral: 2.6 cm

## 2022-07-25 ENCOUNTER — Other Ambulatory Visit: Payer: Self-pay

## 2022-07-25 DIAGNOSIS — I351 Nonrheumatic aortic (valve) insufficiency: Secondary | ICD-10-CM

## 2022-07-26 ENCOUNTER — Other Ambulatory Visit: Payer: Self-pay

## 2022-07-26 MED ORDER — CHLORTHALIDONE 25 MG PO TABS: 25.0000 mg | ORAL_TABLET | Freq: Every day | ORAL | 2 refills | Status: DC

## 2022-07-26 NOTE — Telephone Encounter (Signed)
Pt's medication was sent to pt's pharmacy as requested. Confirmation received.  °

## 2022-07-28 ENCOUNTER — Encounter: Payer: Self-pay | Admitting: Internal Medicine

## 2022-07-28 NOTE — Progress Notes (Signed)
Outside notes received. Information abstracted. Notes sent to scan.  

## 2022-08-11 DIAGNOSIS — M8589 Other specified disorders of bone density and structure, multiple sites: Secondary | ICD-10-CM | POA: Diagnosis not present

## 2022-08-11 DIAGNOSIS — M818 Other osteoporosis without current pathological fracture: Secondary | ICD-10-CM | POA: Diagnosis not present

## 2022-08-11 LAB — HM DEXA SCAN

## 2022-08-22 ENCOUNTER — Ambulatory Visit: Payer: Medicare Other | Admitting: Physician Assistant

## 2022-08-23 NOTE — Telephone Encounter (Signed)
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

## 2022-08-28 ENCOUNTER — Other Ambulatory Visit: Payer: Self-pay | Admitting: *Deleted

## 2022-08-28 MED ORDER — ROSUVASTATIN CALCIUM 5 MG PO TABS: 5.0000 mg | ORAL_TABLET | Freq: Every day | ORAL | 11 refills | Status: DC

## 2022-09-14 ENCOUNTER — Encounter: Payer: Self-pay | Admitting: Internal Medicine

## 2022-09-14 NOTE — Progress Notes (Signed)
Outside notes received. Information abstracted. Notes sent to scan.  

## 2022-09-15 NOTE — Progress Notes (Signed)
Cardiology Office Note    Date:  09/18/2022   ID:  [name], DOB 1945/09/11, MRN [id number]  PCP:  Binnie Rail, MD  Cardiologist:  Previously Dr Meda Coffee but patient has requested f/u with me.  Electrophysiologist:  None   Chief Complaint: f/u CAD, HTN  History of Present Illness:   [name] is a 77 y.o. female with history of moderate CAD by coronary CT, aortic atherosclerosis, mild AI with mildly dilated ascending aorta, HTN, PSVT, PACs, PVCs, ankylosing spondylitis/HLA B27 positive, anxiety, osteoarthritis, Bell's palsy, diverticulosis, pancreatic insufficiency, HLD, HTN, interstitial cystitis, OA, pre-DM, OSA on CPAP (followed at [facility]), mild anemia who presents for f/u. She has requested close cardiology follow-up on a regular basis. [name] (who has a complex medical history and for whom she is primary caregiver) and daughter [name] are also followed by our clinic.   She previously followed with Dr. Meda Coffee for history of atypical sharp chest pain and nonobstructive CAD. Last ischemic assessment was by coronary CTA in 07/2020 with moderate stenosis in the prox and distal RCA, mild stenosis in the prox LAD with negative FFR, no abnormalities in aorta. Event monitor 2022 showed NSR with short runs of SVT, rare PVCs, PACs, average HR 75bpm, range 42-176 (low reading was during sleeping hours). At that time, palpitations resolved without intervention. In a previous year she had reported lower extremity edema and eye puffiness. Amlodipine was changed to HCTZ but eventually switched back at a later time by primary care as she had muscle cramping. In 06/2021 she wished to revisit diuretic for trace edema so was trialed on spironolactone but felt poorly with this (felt like she was not able to void) so went back to amlodipine. At [place] 09/2021, she reported intermittent fleeting dizziness prompting repeat monitor showing predominantly NSR with average HR 75bpm, with 20 nonsustained SVT  events (longest 14 beats), <1% PACs/PVCs, triggered events mostly correlated with NSR, occasionally with PACS/PVCs and 1 run of SVT - no sustained arrhythmias or pauses were noted. I had suggested initiation of carvedilol to address both SVT and elevated BP. At f/u 6/023 she reported continued dizziness/disequilibrium and new thought disturbances so was sent to ER for CVA r/o with reassuring workup including CTA head/neck. Meclizine was rx'd and recommended to f/u neurology. Chlorthalidone added in 01/2022 for BP. She previously followed with the lipid clinic in the past for cholesterol as well. She has a history of mylagias/RLS-type neuropathy although it was not clear this was specifically related to statin itself since this also occurred while off statin in the past. LE arterial testing 2020 was normal. She was also noted to previously have mildly elevated CK which normalized off statin earlier this year. She saw pharmD in follow-up 02/2022 given issues with statin/CK and plan to re-trial low dose rosuvastatin with close 6 week lab follow-up. F/u LDL was 46, CK mildly elevated at 220, pharmD felt OK to continue. She is planning to establish care with a new rheumatologist. Repeat echo 06/2022 EF 60-65%, G1DD, mild AI, mild dilation of ascending aorta.  She returns for follow-up today overall doing OK from cardiac standpoint. Still has various areas of muscle cramping, now predominantly focused on inner left thigh. She also notices more prominence of her lower extremity varicosities and the beginning of what looks like some venous stasis hyperpigmentation. She does not have significant LE edema on exam today. She still has the similar "hot poker" like chest pain periodically but no anginal type  chest pain. Initial BP 138/80, recheck 120/62. Dizziness and palpitations have not really been a significant issue recently. She changed over from mag oxide to mag glycinate.    Labwork independently reviewed: 03/2022  ANA, CRP wnl; ESR 34, Hgb 11.4, plt 276, K 3.6, Cr 1.0, AST ALT OK, CK 220, LDL 46, trig 91 12/2021 TSH wnl  Past History   Past Medical History:  Diagnosis Date   Allergy    seasonal   Ankylosing spondylitis (HCC)    Anxiety    Arthritis    Bell palsy    CAD (coronary artery disease)    a. Coronary CT 10/2016 showed calcium score in the 59th perecntile, moderate CAD in RCA/PDA and narrowing of the mid to distal LAD, with negative FFR.   CTS (carpal tunnel syndrome)    Cystitis    Diabetes mellitus without complication ([place])    Diverticulitis    Diverticulosis of colon    Dry eye syndrome    Exocrine pancreatic insufficiency    Frozen shoulder    GERD (gastroesophageal reflux disease)    HLA B27 (HLA B27 positive)    Hx of colonic polyps    Hyperlipidemia    NMR 2005   Hypertension    Hypothyroidism    Incontinence    Internal hemorrhoids    Interstitial cystitis    Kidney stone    Menopause    Mild aortic insufficiency    Mild ascending aorta dilatation (HCC)    Osteoarthritis    Osteopenia    [facility] [address]   Pre-diabetes    PSVT (paroxysmal supraventricular tachycardia)    S/P total hysterectomy and bilateral salpingo-oophorectomy 04/12/2014   Sinusitis    Sleep apnea    c-pap   Subjective visual disturbance of both eyes 12/30/2013   Tubular adenoma of colon    Vitamin D deficiency    Xerostomia    and Xeroophthalmia    Past Surgical History:  Procedure Laterality Date   BLADDER SURGERY     for incontinence   CARPAL TUNNEL RELEASE Left 12/22/2014   Procedure: LEFT CARPAL TUNNEL RELEASE;  Surgeon: Daryll Brod, MD;  Location: [place];  Service: Orthopedics;  Laterality: Left;   CARPAL TUNNEL RELEASE Right 02/18/2019   Procedure: RIGHT CARPAL TUNNEL RELEASE;  Surgeon: Daryll Brod, MD;  Location: [place];  Service: Orthopedics;  Laterality: Right;   cataract excision Right 2022   COLONOSCOPY W/ POLYPECTOMY      adenomatous poyp 04-2008   colonscopy     tics 10-2002   CYSTOSCOPY WITH RETROGRADE PYELOGRAM, URETEROSCOPY AND STENT PLACEMENT Left 09/01/2013   Procedure: CYSTOSCOPY WITH RETROGRADE PYELOGRAM, AND LEFT STENT PLACEMENT;  Surgeon: Molli Hazard, MD;  Location: [facility];  Service: Urology;  Laterality: Left;   elevated LFTs     due to [place]   FINGER SURGERY Left 2004   2nd finger   G 1 P 1     interstitial cystitis     with clot in catheter   PARTIAL HYSTERECTOMY     with one ovary left   SHOULDER ARTHROSCOPY Left    TOTAL ABDOMINAL HYSTERECTOMY W/ BILATERAL SALPINGOOPHORECTOMY     dysfunctional menses    Current Medications: Current Meds  Medication Sig   acetaminophen (TYLENOL) 500 MG tablet Take 1,000 mg by mouth 3 (three) times daily as needed.    amLODipine (NORVASC) 5 MG tablet Take 1 tablet (5 mg total) by mouth daily.  Ascorbic Acid (VITAMIN C) 1000 MG tablet Take 1,000 mg by mouth 2 (two) times daily.    aspirin 81 MG tablet Take 81 mg by mouth daily.   carvedilol (COREG) 3.125 MG tablet TAKE 1 TABLET BY MOUTH TWICE A DAY   chlorthalidone (HYGROTON) 25 MG tablet Take 1 tablet (25 mg total) by mouth daily. She clarifies she is taking 12.40m daily.   Cholecalciferol (VITAMIN D-3) 25 MCG (1000 UT) CAPS Take 10,000 Units by mouth daily.    diclofenac Sodium (VOLTAREN) 1 % GEL Apply topically 4 (four) times daily as needed.    dicyclomine (BENTYL) 10 MG capsule Take 1 to 2 tablets three times daily as needed   EPINEPHrine (EPIPEN 2-PAK) 0.3 mg/0.3 mL IJ SOAJ injection Use as directed for severe allergic reactions   HYDROcodone-acetaminophen (NORCO/VICODIN) 5-325 MG tablet Take 1 tablet by mouth every 6 (six) hours as needed for moderate pain.   MAGNESIUM GLYCINATE PO Take 240 mg by mouth daily.   meclizine (ANTIVERT) 25 MG tablet Take 0.5 tablets (12.5 mg total) by mouth 2 (two) times daily as needed for dizziness.   Multiple Vitamins-Minerals (CENTRUM SILVER  50+WOMEN PO) Take 1 tablet by mouth daily.   naloxone (NARCAN) 4 MG/0.1ML LIQD nasal spray kit Narcan 4 mg/actuation nasal spray  PLACE 1 SPRAY INTO EACH NOSTRIL FOR SUSPECTED OPIOID OVERDOSE. MAY REPEAT 2 - 3 MINUTES WITH NEW BOTTLE IN OTHER NOSTRIL   Omega-3 Fatty Acids (FISH OIL BURP-LESS) 1000 MG CAPS Takes 1000 mg BID   ondansetron (ZOFRAN) 4 MG tablet Take 1 tablet (4 mg total) by mouth every 8 (eight) hours as needed.   potassium chloride SA (KLOR-CON M20) 20 MEQ tablet Take 1 tablet (20 mEq total) by mouth daily.   rosuvastatin (CRESTOR) 5 MG tablet Take 1 tablet (5 mg total) by mouth daily.   vitamin B-12 (CYANOCOBALAMIN) 1000 MCG tablet Take 1,000 mcg by mouth 2 (two) times daily.    vitamin E 400 UNIT capsule Take 400 Units by mouth daily.   zinc gluconate 50 MG tablet Take 50 mg by mouth daily.   [DISCONTINUED] fluorometholone (FML) 0.1 % ophthalmic suspension INSTILL 1 DROP INTO EACH EYE TWICE DAILY FOR 2 WEEKS   [DISCONTINUED] lipase/protease/amylase (CREON) 36000 UNITS CPEP capsule Take 2 capsules by mouth with each meal, 1 capsule by mouth with each snack   [DISCONTINUED] magnesium oxide (MAG-OX) 400 MG tablet Take 200 mg by mouth daily.      Allergies:   Bactrim [sulfamethoxazole-trimethoprim], Nitroglycerin, Pentosan polysulfate, Pentosan polysulfate sodium, Promethazine hcl, Sulfamethoxazole-trimethoprim, Losartan, Promethazine hcl, and Lisinopril   Social History   Socioeconomic History   Marital status: Married    Spouse name: Not on file   Number of children: 1   Years of education: BS   Highest education level: Not on file  Occupational History   Occupation: retired   Occupation: tPharmacist, hospital   Comment: Kindergarten  Tobacco Use   Smoking status: Never   Smokeless tobacco: Never  Vaping Use   Vaping Use: Never used  Substance and Sexual Activity   Alcohol use: Yes    Comment: social   Drug use: No   Sexual activity: Not Currently  Other Topics Concern   Not  on file  Social History Narrative   Lives at home with husband, is his caregiver   No daily use of caffeine.   Right-handed.         Social Determinants of Health   Financial Resource Strain: Low Risk  (  03/17/2022)   Overall Financial Resource Strain (CARDIA)    Difficulty of Paying Living Expenses: Not hard at all  Food Insecurity: No Food Insecurity (03/17/2022)   Hunger Vital Sign    Worried About Running Out of Food in the Last Year: Never true    Ran Out of Food in the Last Year: Never true  Transportation Needs: No Transportation Needs (03/17/2022)   PRAPARE - Hydrologist (Medical): No    Lack of Transportation (Non-Medical): No  Physical Activity: Sufficiently Active (03/17/2022)   Exercise Vital Sign    Days of Exercise per Week: 7 days    Minutes of Exercise per Session: 30 min  Stress: No Stress Concern Present (03/17/2022)   [place]    Feeling of Stress : Not at all  Social Connections: [place] (03/17/2022)   Social Connection and Isolation Panel [NHANES]    Frequency of Communication with Friends and Family: More than three times a week    Frequency of Social Gatherings with Friends and Family: Once a week    Attends Religious Services: 1 to 4 times per year    Active Member of [employer] or Organizations: Yes    Attends Music therapist: 1 to 4 times per year    Marital Status: Married     Family History:  The patient's family history includes Allergic rhinitis in her sister and sister; Asthma in her mother; Breast cancer in her maternal aunt; Cirrhosis in her father; Colon cancer (age of onset: 61) in her sister; Coronary artery disease in her maternal grandfather and mother; Dementia in her mother; Diabetes in her father, paternal aunt, and paternal grandmother; Food Allergy in her sister; Hypertension in her mother; Renal cancer in her paternal aunt;  Stroke in her mother. There is no history of Esophageal cancer or Stomach cancer.  ROS:   Please see the history of present illness.  All other systems are reviewed and otherwise negative.    EKG(s)/Additional Testing   EKG:  EKG is ordered today, personally reviewed, demonstrating NSR 72bpm, left axis deviation, possible prior anterolateral infarct with poor R wave progression, no significant change from prior.  CV Studies: Cardiac studies reviewed are outlined and summarized above. Otherwise please see EMR for full report.  Recent Labs: 01/18/2022: TSH 4.150 04/17/2022: ALT 17; BUN 22; Creatinine, Ser 1.00; Hemoglobin 11.4; Platelets 276.0; Potassium 3.6; Sodium 138  Recent Lipid Panel    Component Value Date/Time   CHOL 123 04/17/2022 0852   TRIG 91 04/17/2022 0852   TRIG 181 (H) 07/23/2006 1101   HDL 60 04/17/2022 0852   CHOLHDL 2.1 04/17/2022 0852   CHOLHDL 2 06/03/2021 1212   VLDL 24.2 06/03/2021 1212   LDLCALC 46 04/17/2022 0852   LDLDIRECT 114.0 05/02/2016 0919    PHYSICAL EXAM:    VS:  BP 138/80   Pulse 75   Ht 5' 2"$  (1.575 m)   Wt 159 lb (72.1 kg)   SpO2 97%   BMI 29.08 kg/m   BMI: Body mass index is 29.08 kg/m.  GEN: Well nourished, well developed female in no acute distress HEENT: normocephalic, atraumatic Neck: no JVD, carotid bruits, or masses Cardiac: RRR; no murmurs, rubs, or gallops, no edema  Respiratory:  clear to auscultation bilaterally, normal work of breathing GI: soft, nontender, nondistended, + BS MS: no deformity or atrophy Skin: warm and dry, early venous stasis hyperpigmentation changes BLE, varicose veins Neuro:  Alert and Oriented x 3, Strength and sensation are intact, follows commands Psych: euthymic mood, full affect  Wt Readings from Last 3 Encounters:  09/18/22 159 lb (72.1 kg)  04/26/22 154 lb (69.9 kg)  03/21/22 154 lb (69.9 kg)     ASSESSMENT & PLAN:   1. Essential HTN - well controlled on recheck by me at 120/62 on  amlodipine 63m, chlorthalidone 12.569mdaily and carvedilol 3.12533mID. We did not titrate chlorthalidone in the past due to mild bump in Cr, carvedilol due to baseline HR 60s previously, and amlodipine limited by LE edema. Therefore she is on non-maxed doses of 3 medicines but my gestalt is she is doing well overall on this regimen. She was previously intolerant of spironolactone. Update BMET, Mg today with h/o muscle cramping.  2. CAD/Hyperlipidemia - continues with chronic atypical hot poker type chest pain but no anginal progression. Continue medical therapy with ASA, amlodipine, carvedilol, rosuvastatin at low dose.  3. PSVT, PACs, PVCs - relatively quiescent, continue BB.  4. Aortic insufficiency - mild by last echo, anticipate repeat study 07/2023 to f/u aorta.  5. Mild dilation of ascending aorta - anticipate repeat study 07/2023 to f/u aorta size, can be arranged closer to that time.  6. Varicose veins - refer to [facility].     Disposition: F/u with me in 3 months per pt request.   Medication Adjustments/Labs and Tests Ordered: Current medicines are reviewed at length with the patient today.  Concerns regarding medicines are outlined above. Medication changes, Labs and Tests ordered today are summarized above and listed in the Patient Instructions accessible in Encounters.   Signed, DayCharlie PitterA-C  09/18/2022 2:24 PM    [place]: [phone]: [phone]

## 2022-09-18 ENCOUNTER — Encounter: Payer: Self-pay | Admitting: Physician Assistant

## 2022-09-18 ENCOUNTER — Ambulatory Visit: Payer: Medicare Other | Attending: Physician Assistant | Admitting: Physician Assistant

## 2022-09-18 VITALS — BP 120/62 | HR 75 | Ht 62.0 in | Wt 159.0 lb

## 2022-09-18 DIAGNOSIS — I10 Essential (primary) hypertension: Secondary | ICD-10-CM | POA: Insufficient documentation

## 2022-09-18 DIAGNOSIS — I8390 Asymptomatic varicose veins of unspecified lower extremity: Secondary | ICD-10-CM | POA: Insufficient documentation

## 2022-09-18 DIAGNOSIS — I351 Nonrheumatic aortic (valve) insufficiency: Secondary | ICD-10-CM

## 2022-09-18 DIAGNOSIS — I2510 Atherosclerotic heart disease of native coronary artery without angina pectoris: Secondary | ICD-10-CM | POA: Diagnosis not present

## 2022-09-18 DIAGNOSIS — I77810 Thoracic aortic ectasia: Secondary | ICD-10-CM | POA: Diagnosis not present

## 2022-09-18 DIAGNOSIS — E785 Hyperlipidemia, unspecified: Secondary | ICD-10-CM

## 2022-09-18 DIAGNOSIS — I471 Supraventricular tachycardia, unspecified: Secondary | ICD-10-CM | POA: Diagnosis not present

## 2022-09-18 MED ORDER — CHLORTHALIDONE 25 MG PO TABS: 12.5000 mg | ORAL_TABLET | Freq: Every day | ORAL | 3 refills | Status: DC

## 2022-09-18 NOTE — Patient Instructions (Addendum)
Medication Instructions:  Your physician has recommended you make the following change in your medication:   REDUCE the Chlorthalidone to 25 taking 1/2 tablet daily   *If you need a refill on your cardiac medications before your next appointment, please call your pharmacy*   Lab Work: TODAY:  BMET & MAG   If you have labs (blood work) drawn today and your tests are completely normal, you will receive your results only by: [place] (if you have MyChart) OR A paper copy in the mail If you have any lab test that is abnormal or we need to change your treatment, we will call you to review the results.   Testing/Procedures: None ordered   You have been referred to [facility].     Follow-Up: At [facility], you and your health needs are our priority.  As part of our continuing mission to provide you with exceptional heart care, we have created designated Provider Care Teams.  These Care Teams include your primary Cardiologist (physician) and Advanced Practice Providers (APPs -  Physician Assistants and Nurse Practitioners) who all work together to provide you with the care you need, when you need it.  We recommend signing up for the patient portal called "MyChart".  Sign up information is provided on this After Visit Summary.  MyChart is used to connect with patients for Virtual Visits (Telemedicine).  Patients are able to view lab/test results, encounter notes, upcoming appointments, etc.  Non-urgent messages can be sent to your provider as well.   To learn more about what you can do with MyChart, go to [email or web address].    Your next appointment:   3 month(s)  Provider:   Melina Copa, PA-C         Other Instructions

## 2022-09-19 LAB — BASIC METABOLIC PANEL
BUN/Creatinine Ratio: 24 (ref 12–28)
BUN: 24 mg/dL (ref 8–27)
CO2: 22 mmol/L (ref 20–29)
Calcium: 9.9 mg/dL (ref 8.7–10.3)
Chloride: 102 mmol/L (ref 96–106)
Creatinine, Ser: 1.01 mg/dL — ABNORMAL HIGH (ref 0.57–1.00)
Glucose: 96 mg/dL (ref 70–99)
Potassium: 4.3 mmol/L (ref 3.5–5.2)
Sodium: 140 mmol/L (ref 134–144)
eGFR: 57 mL/min/{1.73_m2} — ABNORMAL LOW (ref >59)

## 2022-09-19 LAB — MAGNESIUM: Magnesium: 2.1 mg/dL (ref 1.6–2.3)

## 2022-09-24 NOTE — Progress Notes (Unsigned)
[facility] [place]  ASSESSMENT / PLAN: [name] is a 77 y.o. female with chronic venous insufficiency of bilateral lower extremity causing dyspigmentation, reticular veins, and varicosities (C4 disease).  Recommend compression and elevation for symptomatic relief. Follow up in three months with venous reflux scan to discuss saphenous vein ablation  CHIEF COMPLAINT: swollen legs  HISTORY OF PRESENT ILLNESS: [name] is a 77 y.o. female who presents to clinic for evaluation of bilateral varicosities, swollen legs. The patient is a very functional woman who gives a fairly classic history of chronic venous insufficiency. She reports progressive swelling over the course of the day. The swelling resolves after waking. She has no history of ulceration.   Past Medical History:  Diagnosis Date   Allergy    seasonal   Ankylosing spondylitis (HCC)    Anxiety    Arthritis    Bell palsy    CAD (coronary artery disease)    a. Coronary CT 10/2016 showed calcium score in the 59th perecntile, moderate CAD in RCA/PDA and narrowing of the mid to distal LAD, with negative FFR.   CTS (carpal tunnel syndrome)    Cystitis    Diabetes mellitus without complication ([facility])    Diverticulitis    Diverticulosis of colon    Dry eye syndrome    Exocrine pancreatic insufficiency    Frozen shoulder    GERD (gastroesophageal reflux disease)    HLA B27 (HLA B27 positive)    Hx of colonic polyps    Hyperlipidemia    NMR 2005   Hypertension    Hypothyroidism    Incontinence    Internal hemorrhoids    Interstitial cystitis    Kidney stone    Menopause    Mild aortic insufficiency    Mild ascending aorta dilatation (HCC)    Osteoarthritis    Osteopenia    [facility] [address]   Pre-diabetes    PSVT (paroxysmal supraventricular tachycardia)    S/P total hysterectomy and bilateral salpingo-oophorectomy 04/12/2014   Sinusitis    Sleep apnea    c-pap    Subjective visual disturbance of both eyes 12/30/2013   Tubular adenoma of colon    Vitamin D deficiency    Xerostomia    and Xeroophthalmia    Past Surgical History:  Procedure Laterality Date   BLADDER SURGERY     for incontinence   CARPAL TUNNEL RELEASE Left 12/22/2014   Procedure: LEFT CARPAL TUNNEL RELEASE;  Surgeon: Daryll Brod, MD;  Location: [facility];  Service: Orthopedics;  Laterality: Left;   CARPAL TUNNEL RELEASE Right 02/18/2019   Procedure: RIGHT CARPAL TUNNEL RELEASE;  Surgeon: Daryll Brod, MD;  Location: [facility];  Service: Orthopedics;  Laterality: Right;   cataract excision Right 2022   COLONOSCOPY W/ POLYPECTOMY     adenomatous poyp 04-2008   colonscopy     tics 10-2002   CYSTOSCOPY WITH RETROGRADE PYELOGRAM, URETEROSCOPY AND STENT PLACEMENT Left 09/01/2013   Procedure: CYSTOSCOPY WITH RETROGRADE PYELOGRAM, AND LEFT STENT PLACEMENT;  Surgeon: Molli Hazard, MD;  Location: [facility];  Service: Urology;  Laterality: Left;   elevated LFTs     due to Elmiron   FINGER SURGERY Left 2004   2nd finger   G 1 P 1     interstitial cystitis     with clot in catheter   PARTIAL HYSTERECTOMY     with one ovary left   SHOULDER ARTHROSCOPY Left  TOTAL ABDOMINAL HYSTERECTOMY W/ BILATERAL SALPINGOOPHORECTOMY     dysfunctional menses    Family History  Problem Relation Age of Onset   Coronary artery disease Mother    Hypertension Mother    Asthma Mother    Dementia Mother    Stroke Mother        mini cva   Diabetes Father    Cirrhosis Father        non alcoholic   Colon cancer Sister 42   Allergic rhinitis Sister    Allergic rhinitis Sister    Food Allergy Sister    Renal cancer Paternal Aunt        x 2   Coronary artery disease Maternal Grandfather    Breast cancer Maternal Aunt    Diabetes Paternal Aunt    Diabetes Paternal Grandmother    Esophageal cancer Neg Hx    Stomach cancer Neg Hx     Social History    Socioeconomic History   Marital status: Married    Spouse name: Not on file   Number of children: 1   Years of education: BS   Highest education level: Not on file  Occupational History   Occupation: retired   Occupation: Pharmacist, hospital    Comment: Kindergarten  Tobacco Use   Smoking status: Never   Smokeless tobacco: Never  Scientific laboratory technician Use: Never used  Substance and Sexual Activity   Alcohol use: Yes    Comment: social   Drug use: No   Sexual activity: Not Currently  Other Topics Concern   Not on file  Social History Narrative   Lives at home with husband, is his caregiver   No daily use of caffeine.   Right-handed.         Social Determinants of Health   Financial Resource Strain: Low Risk  (03/17/2022)   Overall Financial Resource Strain (CARDIA)    Difficulty of Paying Living Expenses: Not hard at all  Food Insecurity: No Food Insecurity (03/17/2022)   Hunger Vital Sign    Worried About Running Out of Food in the Last Year: Never true    Ran Out of Food in the Last Year: Never true  Transportation Needs: No Transportation Needs (03/17/2022)   PRAPARE - Hydrologist (Medical): No    Lack of Transportation (Non-Medical): No  Physical Activity: Sufficiently Active (03/17/2022)   Exercise Vital Sign    Days of Exercise per Week: 7 days    Minutes of Exercise per Session: 30 min  Stress: No Stress Concern Present (03/17/2022)   [employer]    Feeling of Stress : Not at all  Social Connections: [place] (03/17/2022)   Social Connection and Isolation Panel [NHANES]    Frequency of Communication with Friends and Family: More than three times a week    Frequency of Social Gatherings with Friends and Family: Once a week    Attends Religious Services: 1 to 4 times per year    Active Member of [employer] or Organizations: Yes    Attends Archivist Meetings: 1 to 4  times per year    Marital Status: Married  Human resources officer Violence: Not At Risk (03/17/2022)   Humiliation, Afraid, Rape, and Kick questionnaire    Fear of Current or Ex-Partner: No    Emotionally Abused: No    Physically Abused: No    Sexually Abused: No    Allergies  Allergen Reactions  Bactrim [Sulfamethoxazole-Trimethoprim] Other (See Comments)   Nitroglycerin Anaphylaxis    Hypotensive after NTG administration in ER during chest pain evaluation Pt states she almost died,  Pt states in a procedure done in 10/2016 she had no reaction to this medication, so not sure if it is an actual allergy.    Pentosan Polysulfate Other (See Comments)    Elevated liver count Elevated liver enzymes Elevated LFTs.......Marland Kitchen elmiron  Other reaction(s): Not available   Pentosan Polysulfate Sodium     Elevated LFTs.......Marland Kitchen elmiron    Promethazine Hcl Other (See Comments)    "jittery on the inside"   Sulfamethoxazole-Trimethoprim Nausea Only    Unknown reaction per pt, Pt thinks she may have felt sick to her stomach.    Losartan Other (See Comments)    Pt states caused insomnia, tingling sensation in legs, lightheaded, stomach pain   Promethazine Hcl    Lisinopril Cough    Current Outpatient Medications  Medication Sig Dispense Refill   acetaminophen (TYLENOL) 500 MG tablet Take 1,000 mg by mouth 3 (three) times daily as needed.      amLODipine (NORVASC) 5 MG tablet Take 1 tablet (5 mg total) by mouth daily. 90 tablet 3   Ascorbic Acid (VITAMIN C) 1000 MG tablet Take 1,000 mg by mouth 2 (two) times daily.      aspirin 81 MG tablet Take 81 mg by mouth daily.     carvedilol (COREG) 3.125 MG tablet TAKE 1 TABLET BY MOUTH TWICE A DAY 60 tablet 11   chlorthalidone (HYGROTON) 25 MG tablet Take 0.5 tablets (12.5 mg total) by mouth daily. 45 tablet 3   Cholecalciferol (VITAMIN D-3) 25 MCG (1000 UT) CAPS Take 10,000 Units by mouth daily.      diclofenac Sodium (VOLTAREN) 1 % GEL Apply topically 4  (four) times daily as needed.      dicyclomine (BENTYL) 10 MG capsule Take 1 to 2 tablets three times daily as needed 90 capsule 1   EPINEPHrine (EPIPEN 2-PAK) 0.3 mg/0.3 mL IJ SOAJ injection Use as directed for severe allergic reactions 2 each 1   HYDROcodone-acetaminophen (NORCO/VICODIN) 5-325 MG tablet Take 1 tablet by mouth every 6 (six) hours as needed for moderate pain. 30 tablet 0   MAGNESIUM GLYCINATE PO Take 240 mg by mouth daily.     meclizine (ANTIVERT) 25 MG tablet Take 0.5 tablets (12.5 mg total) by mouth 2 (two) times daily as needed for dizziness. 30 tablet 3   Multiple Vitamins-Minerals (CENTRUM SILVER 50+WOMEN PO) Take 1 tablet by mouth daily.     naloxone (NARCAN) 4 MG/0.1ML LIQD nasal spray kit Narcan 4 mg/actuation nasal spray  PLACE 1 SPRAY INTO EACH NOSTRIL FOR SUSPECTED OPIOID OVERDOSE. MAY REPEAT 2 - 3 MINUTES WITH NEW BOTTLE IN OTHER NOSTRIL     Omega-3 Fatty Acids (FISH OIL BURP-LESS) 1000 MG CAPS Takes 1000 mg BID 60 capsule 0   ondansetron (ZOFRAN) 4 MG tablet Take 1 tablet (4 mg total) by mouth every 8 (eight) hours as needed. 30 tablet 1   potassium chloride SA (KLOR-CON M20) 20 MEQ tablet Take 1 tablet (20 mEq total) by mouth daily. 90 tablet 3   rosuvastatin (CRESTOR) 5 MG tablet Take 1 tablet (5 mg total) by mouth daily. 30 tablet 11   vitamin B-12 (CYANOCOBALAMIN) 1000 MCG tablet Take 1,000 mcg by mouth 2 (two) times daily.      vitamin E 400 UNIT capsule Take 400 Units by mouth daily.     zinc gluconate  50 MG tablet Take 50 mg by mouth daily.     omeprazole (PRILOSEC) 40 MG capsule Take 1 capsule (40 mg total) by mouth daily. 90 capsule 4   No current facility-administered medications for this visit.    PHYSICAL EXAM Vitals:   09/25/22 1112  BP: 128/76  Pulse: 73  Resp: 14  Temp: 97.8 F (36.6 C)  TempSrc: Temporal  Weight: 158 lb (71.7 kg)  Height: '5\' 2"'$  (1.575 m)    Elderly woman in no distress Regular rate and rhythm Unlabored breathing 2+ DP  pulses Reticular and varicose veins in bilateral lower extremities Dyspigmentation about ankles bilaterally    PERTINENT LABORATORY AND RADIOLOGIC DATA  Most recent CBC    Latest Ref Rng & Units 04/17/2022   12:53 PM 02/10/2022   11:31 AM 01/18/2022    3:54 PM  CBC  WBC 4.0 - 10.5 K/uL 5.7  WILL FOLLOW  P 10.3   Hemoglobin 12.0 - 15.0 g/dL 11.4  WILL FOLLOW  P 12.1   Hematocrit 36.0 - 46.0 % 33.5  WILL FOLLOW  P 36.7   Platelets 150.0 - 400.0 K/uL 276.0  WILL FOLLOW  P 346     P Preliminary result     Most recent CMP    Latest Ref Rng & Units 09/18/2022    3:40 PM 04/17/2022   12:53 PM 04/17/2022    8:52 AM  CMP  Glucose 70 - 99 mg/dL 96  99  109   BUN 8 - 27 mg/dL '24  22  21   '$ Creatinine 0.57 - 1.00 mg/dL 1.01  1.00  0.95   Sodium 134 - 144 mmol/L 140  138  141   Potassium 3.5 - 5.2 mmol/L 4.3  3.6  3.8   Chloride 96 - 106 mmol/L 102  101  100   CO2 20 - 29 mmol/L '22  28  27   '$ Calcium 8.7 - 10.3 mg/dL 9.9  9.7  9.9   Total Protein 6.0 - 8.3 g/dL  7.5  7.2   Total Bilirubin 0.2 - 1.2 mg/dL  0.4  0.3   Alkaline Phos 39 - 117 U/L  95  108   AST 0 - 37 U/L  21  25   ALT 0 - 35 U/L  17  17     Renal function Estimated Creatinine Clearance: 42.5 mL/min (A) (by C-G formula based on SCr of 1.01 mg/dL (H)).  Hgb A1c MFr Bld (%)  Date Value  03/02/2022 6.4 (H)    LDL Chol Calc (NIH)  Date Value Ref Range Status  04/17/2022 46 0 - 99 mg/dL Final   Direct LDL  Date Value Ref Range Status  05/02/2016 114.0 mg/dL Final    Comment:    Optimal:  <100 mg/dLNear or Above Optimal:  100-129 mg/dLBorderline High:  130-159 mg/dLHigh:  160-189 mg/dLVery High:  >190 mg/dL    Yevonne Aline. Stanford Breed, MD [facility] Phone Number: [phone] 09/25/2022 8:13 PM   Total time spent on preparing this encounter including chart review, data review, collecting history, examining the patient, coordinating care for this new patient, 45  minutes.  Portions of this report may have been transcribed using voice recognition software.  Every effort has been made to ensure accuracy; however, inadvertent computerized transcription errors may still be present.

## 2022-09-25 ENCOUNTER — Encounter: Payer: Self-pay | Admitting: Vascular Surgery

## 2022-09-25 ENCOUNTER — Ambulatory Visit (INDEPENDENT_AMBULATORY_CARE_PROVIDER_SITE_OTHER): Payer: Medicare Other | Admitting: Vascular Surgery

## 2022-09-25 VITALS — BP 128/76 | HR 73 | Temp 97.8°F | Resp 14 | Ht 62.0 in | Wt 158.0 lb

## 2022-09-25 DIAGNOSIS — I872 Venous insufficiency (chronic) (peripheral): Secondary | ICD-10-CM

## 2022-09-27 ENCOUNTER — Ambulatory Visit (INDEPENDENT_AMBULATORY_CARE_PROVIDER_SITE_OTHER): Payer: Medicare Other | Admitting: Internal Medicine

## 2022-09-27 ENCOUNTER — Encounter: Payer: Self-pay | Admitting: Internal Medicine

## 2022-09-27 VITALS — BP 116/72 | HR 65 | Temp 98.2°F | Ht 62.0 in | Wt 158.0 lb

## 2022-09-27 DIAGNOSIS — M79606 Pain in leg, unspecified: Secondary | ICD-10-CM | POA: Insufficient documentation

## 2022-09-27 DIAGNOSIS — J3489 Other specified disorders of nose and nasal sinuses: Secondary | ICD-10-CM | POA: Insufficient documentation

## 2022-09-27 DIAGNOSIS — J329 Chronic sinusitis, unspecified: Secondary | ICD-10-CM

## 2022-09-27 DIAGNOSIS — I2510 Atherosclerotic heart disease of native coronary artery without angina pectoris: Secondary | ICD-10-CM

## 2022-09-27 MED ORDER — AMOXICILLIN-POT CLAVULANATE 875-125 MG PO TABS: 1.0000 | ORAL_TABLET | Freq: Two times a day (BID) | ORAL | 0 refills | Status: AC

## 2022-09-27 MED ORDER — MUPIROCIN 2 % EX OINT: 1.0000 | TOPICAL_OINTMENT | Freq: Two times a day (BID) | CUTANEOUS | 0 refills | Status: DC

## 2022-09-27 NOTE — Patient Instructions (Addendum)
     Medications changes include :   Augmentin,ointment for nose        Return for follow up, diabetes.

## 2022-09-27 NOTE — Assessment & Plan Note (Signed)
Subacute Two small nasal sores in left nostril ? Infection Mupirocin ointment bid x 5 days Continue ayr , vaseline

## 2022-09-27 NOTE — Progress Notes (Signed)
Subjective:    Patient ID: [name], female    DOB: 10-May-1946, 77 y.o.   MRN: [id number]      HPI [name] is here for  Chief Complaint  Patient presents with   Sinusitis    Sore in upper left nose (top and bottom) treated at walk-in clinic; Still having post nasal drainage still (patient over the counter spray and gel )    She is here for an acute visit for cold symptoms. Wa seen in December at urgent care.  She was given amoxicillin x 7 days - it did not help much.  She has had persistent symptoms since then.    Her symptoms started in December  She is experiencing nasal congestion, PND, sinus pressure, cough that is occasional, SOB that is occasional.  She has sores in her nostrils that have not healed.  She has intermittent nasal bleeding.    She has tried taking ayr gel and spray.  Has also used vaseline.     Weird feeling from medial groin to knee - there all the time.  If sitting for long time - notices it more.  Medications and allergies reviewed with patient and updated if appropriate.  Current Outpatient Medications on File Prior to Visit  Medication Sig Dispense Refill   acetaminophen (TYLENOL) 500 MG tablet Take 1,000 mg by mouth 3 (three) times daily as needed.      amLODipine (NORVASC) 5 MG tablet Take 1 tablet (5 mg total) by mouth daily. 90 tablet 3   Ascorbic Acid (VITAMIN C) 1000 MG tablet Take 1,000 mg by mouth 2 (two) times daily.      aspirin 81 MG tablet Take 81 mg by mouth daily.     carvedilol (COREG) 3.125 MG tablet TAKE 1 TABLET BY MOUTH TWICE A DAY 60 tablet 11   chlorthalidone (HYGROTON) 25 MG tablet Take 0.5 tablets (12.5 mg total) by mouth daily. 45 tablet 3   Cholecalciferol (VITAMIN D-3) 25 MCG (1000 UT) CAPS Take 10,000 Units by mouth daily.      diclofenac Sodium (VOLTAREN) 1 % GEL Apply topically 4 (four) times daily as needed.      dicyclomine (BENTYL) 10 MG capsule Take 1 to 2 tablets three times daily as needed 90 capsule 1    EPINEPHrine (EPIPEN 2-PAK) 0.3 mg/0.3 mL IJ SOAJ injection Use as directed for severe allergic reactions 2 each 1   MAGNESIUM GLYCINATE PO Take 240 mg by mouth daily.     meclizine (ANTIVERT) 25 MG tablet Take 0.5 tablets (12.5 mg total) by mouth 2 (two) times daily as needed for dizziness. 30 tablet 3   Multiple Vitamins-Minerals (CENTRUM SILVER 50+WOMEN PO) Take 1 tablet by mouth daily.     naloxone (NARCAN) 4 MG/0.1ML LIQD nasal spray kit Narcan 4 mg/actuation nasal spray  PLACE 1 SPRAY INTO EACH NOSTRIL FOR SUSPECTED OPIOID OVERDOSE. MAY REPEAT 2 - 3 MINUTES WITH NEW BOTTLE IN OTHER NOSTRIL     Omega-3 Fatty Acids (FISH OIL BURP-LESS) 1000 MG CAPS Takes 1000 mg BID 60 capsule 0   ondansetron (ZOFRAN) 4 MG tablet Take 1 tablet (4 mg total) by mouth every 8 (eight) hours as needed. 30 tablet 1   potassium chloride SA (KLOR-CON M20) 20 MEQ tablet Take 1 tablet (20 mEq total) by mouth daily. 90 tablet 3   rosuvastatin (CRESTOR) 5 MG tablet Take 1 tablet (5 mg total) by mouth daily. 30 tablet 11   vitamin B-12 (CYANOCOBALAMIN) 1000 MCG  tablet Take 1,000 mcg by mouth 2 (two) times daily.      vitamin E 400 UNIT capsule Take 400 Units by mouth daily.     zinc gluconate 50 MG tablet Take 50 mg by mouth daily.     omeprazole (PRILOSEC) 40 MG capsule Take 1 capsule (40 mg total) by mouth daily. 90 capsule 4   No current facility-administered medications on file prior to visit.    Review of Systems  Constitutional:  Negative for chills and fever.  HENT:  Positive for congestion (clear), postnasal drip and sinus pressure. Negative for ear pain and sore throat.        Nose sores  Respiratory:  Positive for cough (occ) and shortness of breath (occ). Negative for wheezing.   Neurological:  Negative for dizziness, light-headedness and headaches.       Objective:   Vitals:   09/27/22 1441  BP: 116/72  Pulse: 65  Temp: 98.2 F (36.8 C)  SpO2: 97%   BP Readings from Last 3 Encounters:   09/27/22 116/72  09/25/22 128/76  09/18/22 120/62   Wt Readings from Last 3 Encounters:  09/27/22 158 lb (71.7 kg)  09/25/22 158 lb (71.7 kg)  09/18/22 159 lb (72.1 kg)   Body mass index is 28.9 kg/m.    Physical Exam Constitutional:      General: She is not in acute distress.    Appearance: Normal appearance. She is not ill-appearing.  HENT:     Head: Normocephalic and atraumatic.     Right Ear: Tympanic membrane, ear canal and external ear normal.     Left Ear: Tympanic membrane, ear canal and external ear normal.     Nose:     Comments: Two small nasal sores seen in left nostril - lateral edge    Mouth/Throat:     Mouth: Mucous membranes are moist.     Pharynx: No oropharyngeal exudate or posterior oropharyngeal erythema.  Eyes:     Conjunctiva/sclera: Conjunctivae normal.  Cardiovascular:     Rate and Rhythm: Normal rate and regular rhythm.  Pulmonary:     Effort: Pulmonary effort is normal. No respiratory distress.     Breath sounds: Normal breath sounds. No wheezing or rales.  Musculoskeletal:        General: Tenderness (anteior groin and anterior thighs) present.     Cervical back: Neck supple. No tenderness.  Lymphadenopathy:     Cervical: No cervical adenopathy.  Skin:    General: Skin is warm and dry.  Neurological:     Mental Status: She is alert.            Assessment & Plan:    See Problem List for Assessment and Plan of chronic medical problems.

## 2022-09-27 NOTE — Assessment & Plan Note (Signed)
New B/l groin and anterior upper leg tenderness w/ palpation - worse when she first gets up ?tight hip flexers vs OA of hips Will see Dr Tamala Julian

## 2022-09-27 NOTE — Assessment & Plan Note (Signed)
Subacute - chronic Likely bacterial  Start Augmentin 875-125 mg BID x 10 day otc cold medications Rest, fluid Call if no improvement

## 2022-10-22 ENCOUNTER — Encounter: Payer: Self-pay | Admitting: Internal Medicine

## 2022-10-22 NOTE — Patient Instructions (Addendum)
      Blood work was ordered.   The lab is on the first floor.    Medications changes include :   none    A referral was ordered for XXX.     Someone will call you to schedule an appointment.    Return in about 6 months (around 04/27/2023) for follow up.

## 2022-10-22 NOTE — Progress Notes (Unsigned)
Subjective:    Patient ID: [name], female    DOB: Dec 06, 1945, 77 y.o.   MRN: [id number]     HPI [name] is here for follow up of her chronic medical problems.  For the past week has had cramping in lower abdomen.  H/o IC.  Cramping is not related to bowels/gas.  Bowels are normal.  No change in eating.    Fatigued all the time.   Medications and allergies reviewed with patient and updated if appropriate.  Current Outpatient Medications on File Prior to Visit  Medication Sig Dispense Refill   acetaminophen (TYLENOL) 500 MG tablet Take 1,000 mg by mouth 3 (three) times daily as needed.      amLODipine (NORVASC) 5 MG tablet Take 1 tablet (5 mg total) by mouth daily. 90 tablet 3   Ascorbic Acid (VITAMIN C) 1000 MG tablet Take 1,000 mg by mouth 2 (two) times daily.      aspirin 81 MG tablet Take 81 mg by mouth daily.     carvedilol (COREG) 3.125 MG tablet TAKE 1 TABLET BY MOUTH TWICE A DAY 60 tablet 11   chlorthalidone (HYGROTON) 25 MG tablet Take 0.5 tablets (12.5 mg total) by mouth daily. 45 tablet 3   Cholecalciferol (VITAMIN D-3) 25 MCG (1000 UT) CAPS Take 10,000 Units by mouth daily.      diclofenac Sodium (VOLTAREN) 1 % GEL Apply topically 4 (four) times daily as needed.      dicyclomine (BENTYL) 10 MG capsule Take 1 to 2 tablets three times daily as needed 90 capsule 1   EPINEPHrine (EPIPEN 2-PAK) 0.3 mg/0.3 mL IJ SOAJ injection Use as directed for severe allergic reactions 2 each 1   MAGNESIUM GLYCINATE PO Take 240 mg by mouth daily.     meclizine (ANTIVERT) 25 MG tablet Take 0.5 tablets (12.5 mg total) by mouth 2 (two) times daily as needed for dizziness. 30 tablet 3   mupirocin ointment (BACTROBAN) 2 % Apply 1 Application topically 2 (two) times daily. Into both nostrils bid x 5 days 22 g 0   naloxone (NARCAN) 4 MG/0.1ML LIQD nasal spray kit Narcan 4 mg/actuation nasal spray  PLACE 1 SPRAY INTO EACH NOSTRIL FOR SUSPECTED OPIOID OVERDOSE. MAY REPEAT 2 - 3 MINUTES WITH  NEW BOTTLE IN OTHER NOSTRIL     Omega-3 Fatty Acids (FISH OIL BURP-LESS) 1000 MG CAPS Takes 1000 mg BID 60 capsule 0   ondansetron (ZOFRAN) 4 MG tablet Take 1 tablet (4 mg total) by mouth every 8 (eight) hours as needed. 30 tablet 1   potassium chloride SA (KLOR-CON M20) 20 MEQ tablet Take 1 tablet (20 mEq total) by mouth daily. 90 tablet 3   rosuvastatin (CRESTOR) 5 MG tablet Take 1 tablet (5 mg total) by mouth daily. 30 tablet 11   vitamin B-12 (CYANOCOBALAMIN) 1000 MCG tablet Take 1,000 mcg by mouth 2 (two) times daily.      vitamin E 400 UNIT capsule Take 400 Units by mouth daily.     zinc gluconate 50 MG tablet Take 50 mg by mouth daily.     omeprazole (PRILOSEC) 40 MG capsule Take 1 capsule (40 mg total) by mouth daily. 90 capsule 4   No current facility-administered medications on file prior to visit.     Review of Systems  Constitutional:  Positive for fatigue. Negative for fever.  Eyes:  Positive for visual disturbance (left eye since cataract surgery).  Respiratory:  Negative for cough, shortness of breath  and wheezing.   Cardiovascular:  Positive for chest pain (occ - caridac w/u neg) and leg swelling. Negative for palpitations.  Gastrointestinal:  Positive for abdominal pain. Negative for constipation and diarrhea.  Genitourinary:  Positive for frequency. Negative for dysuria, hematuria and urgency.       Urinary incontinence  Musculoskeletal:  Positive for arthralgias and back pain.  Neurological:  Positive for numbness (occ tingling/numbness in legs, fingers). Negative for light-headedness and headaches.        occ sudden feeling of off balance feeling  Psychiatric/Behavioral:  Negative for dysphoric mood. The patient is nervous/anxious.        Objective:   Vitals:   10/25/22 0752  BP: 120/74  Pulse: 80  Temp: 98.6 F (37 C)  SpO2: 95%   BP Readings from Last 3 Encounters:  10/25/22 120/74  09/27/22 116/72  09/25/22 128/76   Wt Readings from Last 3  Encounters:  10/25/22 158 lb (71.7 kg)  09/27/22 158 lb (71.7 kg)  09/25/22 158 lb (71.7 kg)   Body mass index is 28.9 kg/m.    Physical Exam Constitutional:      General: She is not in acute distress.    Appearance: Normal appearance.  HENT:     Head: Normocephalic and atraumatic.  Eyes:     Conjunctiva/sclera: Conjunctivae normal.  Cardiovascular:     Rate and Rhythm: Normal rate and regular rhythm.     Heart sounds: Normal heart sounds.  Pulmonary:     Effort: Pulmonary effort is normal. No respiratory distress.     Breath sounds: Normal breath sounds. No wheezing.  Abdominal:     General: There is no distension.     Palpations: Abdomen is soft.     Tenderness: There is no abdominal tenderness. There is no guarding or rebound.  Musculoskeletal:     Cervical back: Neck supple.     Right lower leg: No edema.     Left lower leg: No edema.  Lymphadenopathy:     Cervical: No cervical adenopathy.  Skin:    General: Skin is warm and dry.     Findings: No rash.  Neurological:     Mental Status: She is alert. Mental status is at baseline.  Psychiatric:        Mood and Affect: Mood normal.        Behavior: Behavior normal.        Lab Results  Component Value Date   WBC 5.7 04/17/2022   HGB 11.4 (L) 04/17/2022   HCT 33.5 (L) 04/17/2022   PLT 276.0 04/17/2022   GLUCOSE 96 09/18/2022   CHOL 123 04/17/2022   TRIG 91 04/17/2022   HDL 60 04/17/2022   LDLDIRECT 114.0 05/02/2016   LDLCALC 46 04/17/2022   ALT 17 04/17/2022   AST 21 04/17/2022   NA 140 09/18/2022   K 4.3 09/18/2022   CL 102 09/18/2022   CREATININE 1.01 (H) 09/18/2022   BUN 24 09/18/2022   CO2 22 09/18/2022   TSH 4.150 01/18/2022   INR 0.9 01/18/2022   HGBA1C 6.4 (H) 03/02/2022   MICROALBUR 1.7 06/03/2021     Assessment & Plan:    See Problem List for Assessment and Plan of chronic medical problems.

## 2022-10-25 ENCOUNTER — Ambulatory Visit (INDEPENDENT_AMBULATORY_CARE_PROVIDER_SITE_OTHER): Payer: Medicare Other | Admitting: Internal Medicine

## 2022-10-25 VITALS — BP 120/74 | HR 80 | Temp 98.6°F | Ht 62.0 in | Wt 158.0 lb

## 2022-10-25 DIAGNOSIS — M459 Ankylosing spondylitis of unspecified sites in spine: Secondary | ICD-10-CM

## 2022-10-25 DIAGNOSIS — I10 Essential (primary) hypertension: Secondary | ICD-10-CM | POA: Diagnosis not present

## 2022-10-25 DIAGNOSIS — M810 Age-related osteoporosis without current pathological fracture: Secondary | ICD-10-CM

## 2022-10-25 DIAGNOSIS — M2550 Pain in unspecified joint: Secondary | ICD-10-CM | POA: Diagnosis not present

## 2022-10-25 DIAGNOSIS — R200 Anesthesia of skin: Secondary | ICD-10-CM

## 2022-10-25 DIAGNOSIS — R5383 Other fatigue: Secondary | ICD-10-CM | POA: Diagnosis not present

## 2022-10-25 DIAGNOSIS — R350 Frequency of micturition: Secondary | ICD-10-CM

## 2022-10-25 DIAGNOSIS — E782 Mixed hyperlipidemia: Secondary | ICD-10-CM

## 2022-10-25 DIAGNOSIS — R202 Paresthesia of skin: Secondary | ICD-10-CM

## 2022-10-25 DIAGNOSIS — K219 Gastro-esophageal reflux disease without esophagitis: Secondary | ICD-10-CM

## 2022-10-25 DIAGNOSIS — E119 Type 2 diabetes mellitus without complications: Secondary | ICD-10-CM | POA: Diagnosis not present

## 2022-10-25 LAB — URINALYSIS, ROUTINE W REFLEX MICROSCOPIC
Bilirubin Urine: NEGATIVE
Hgb urine dipstick: NEGATIVE
Ketones, ur: NEGATIVE
Nitrite: NEGATIVE
Specific Gravity, Urine: 1.02 (ref 1.000–1.030)
Total Protein, Urine: NEGATIVE
Urine Glucose: NEGATIVE
Urobilinogen, UA: 0.2 (ref 0.0–1.0)
pH: 7 (ref 5.0–8.0)

## 2022-10-25 LAB — CBC WITH DIFFERENTIAL/PLATELET
Basophils Absolute: 0.1 10*3/uL (ref 0.0–0.1)
Basophils Relative: 0.7 % (ref 0.0–3.0)
Eosinophils Absolute: 0.2 10*3/uL (ref 0.0–0.7)
Eosinophils Relative: 2.8 % (ref 0.0–5.0)
HCT: 37.8 % (ref 36.0–46.0)
Hemoglobin: 12.9 g/dL (ref 12.0–15.0)
Lymphocytes Relative: 20.4 % (ref 12.0–46.0)
Lymphs Abs: 1.7 10*3/uL (ref 0.7–4.0)
MCHC: 34 g/dL (ref 30.0–36.0)
MCV: 86.4 fl (ref 78.0–100.0)
Monocytes Absolute: 0.6 10*3/uL (ref 0.1–1.0)
Monocytes Relative: 7.5 % (ref 3.0–12.0)
Neutro Abs: 5.7 10*3/uL (ref 1.4–7.7)
Neutrophils Relative %: 68.6 % (ref 43.0–77.0)
Platelets: 275 10*3/uL (ref 150.0–400.0)
RBC: 4.38 Mil/uL (ref 3.87–5.11)
RDW: 13.3 % (ref 11.5–15.5)
WBC: 8.3 10*3/uL (ref 4.0–10.5)

## 2022-10-25 LAB — SEDIMENTATION RATE: Sed Rate: 20 mm/hr (ref 0–30)

## 2022-10-25 LAB — LIPID PANEL
Cholesterol: 131 mg/dL (ref 0–200)
HDL: 61.4 mg/dL (ref >39.00)
LDL Cholesterol: 44 mg/dL (ref 0–99)
NonHDL: 69.52
Total CHOL/HDL Ratio: 2
Triglycerides: 127 mg/dL (ref 0.0–149.0)
VLDL: 25.4 mg/dL (ref 0.0–40.0)

## 2022-10-25 LAB — COMPREHENSIVE METABOLIC PANEL
ALT: 25 U/L (ref 0–35)
AST: 24 U/L (ref 0–37)
Albumin: 4.6 g/dL (ref 3.5–5.2)
Alkaline Phosphatase: 96 U/L (ref 39–117)
BUN: 19 mg/dL (ref 6–23)
CO2: 30 mEq/L (ref 19–32)
Calcium: 10.2 mg/dL (ref 8.4–10.5)
Chloride: 101 mEq/L (ref 96–112)
Creatinine, Ser: 1.16 mg/dL (ref 0.40–1.20)
GFR: 45.09 mL/min — ABNORMAL LOW (ref >60.00)
Glucose, Bld: 120 mg/dL — ABNORMAL HIGH (ref 70–99)
Potassium: 4.3 mEq/L (ref 3.5–5.1)
Sodium: 139 mEq/L (ref 135–145)
Total Bilirubin: 0.5 mg/dL (ref 0.2–1.2)
Total Protein: 8.4 g/dL — ABNORMAL HIGH (ref 6.0–8.3)

## 2022-10-25 LAB — HEMOGLOBIN A1C: Hgb A1c MFr Bld: 6.7 % — ABNORMAL HIGH (ref 4.6–6.5)

## 2022-10-25 LAB — C-REACTIVE PROTEIN: CRP: <1 mg/dL (ref 0.5–20.0)

## 2022-10-25 LAB — MICROALBUMIN / CREATININE URINE RATIO
Creatinine,U: 89.3 mg/dL
Microalb Creat Ratio: 1.4 mg/g (ref 0.0–30.0)
Microalb, Ur: 1.2 mg/dL (ref 0.0–1.9)

## 2022-10-25 LAB — VITAMIN B12: Vitamin B-12: 652 pg/mL (ref 211–911)

## 2022-10-25 NOTE — Assessment & Plan Note (Signed)
Chronic Had bone density recently - stable Was on boniva and forteo Seeing specialist in [facility]

## 2022-10-25 NOTE — Assessment & Plan Note (Signed)
Chronic In fingers, legs at times B12 level

## 2022-10-25 NOTE — Assessment & Plan Note (Signed)
Chronic Lab Results  Component Value Date   HGBA1C 6.4 (H) 03/02/2022   Sugars have been controlled with lifestyle Check A1c, urine microalbumin Diabetic diet, exercise encouraged

## 2022-10-25 NOTE — Assessment & Plan Note (Signed)
GERD controlled Continue omeprazole 40 mg daily

## 2022-10-25 NOTE — Assessment & Plan Note (Signed)
Chronic Check lipid panel  Continue crestor 5 mg daily Regular exercise and healthy diet encouraged  

## 2022-10-25 NOTE — Assessment & Plan Note (Signed)
Chronic OA, ? Autoimmune arthritis Has had pos RF in past but no evidence of RA at that time per rheum Concerned about diffuse joint pain Will refer back to Dr Trudie Reed

## 2022-10-25 NOTE — Assessment & Plan Note (Signed)
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

## 2022-10-25 NOTE — Assessment & Plan Note (Signed)
Chronic Controlled Continue amlodipine 2.5 mg daily, coreg 3.125 mg bid cmp

## 2022-10-25 NOTE — Assessment & Plan Note (Signed)
Chronic Back pain Seeing Dr Tamala Julian

## 2022-10-28 LAB — URINE CULTURE

## 2022-10-28 LAB — CYCLIC CITRUL PEPTIDE ANTIBODY, IGG: Cyclic Citrullin Peptide Ab: <16 UNITS

## 2022-10-28 LAB — ANA: Anti Nuclear Antibody (ANA): NEGATIVE

## 2022-10-28 LAB — RHEUMATOID FACTOR: Rheumatoid fact SerPl-aCnc: <14 IU/mL (ref <14)

## 2022-11-02 ENCOUNTER — Encounter: Payer: Self-pay | Admitting: *Deleted

## 2022-11-06 DIAGNOSIS — M47896 Other spondylosis, lumbar region: Secondary | ICD-10-CM | POA: Diagnosis not present

## 2022-11-06 DIAGNOSIS — M48061 Spinal stenosis, lumbar region without neurogenic claudication: Secondary | ICD-10-CM | POA: Diagnosis not present

## 2022-11-06 DIAGNOSIS — M810 Age-related osteoporosis without current pathological fracture: Secondary | ICD-10-CM | POA: Diagnosis not present

## 2022-11-06 DIAGNOSIS — G894 Chronic pain syndrome: Secondary | ICD-10-CM | POA: Diagnosis not present

## 2022-11-06 DIAGNOSIS — M25512 Pain in left shoulder: Secondary | ICD-10-CM | POA: Diagnosis not present

## 2022-11-06 DIAGNOSIS — M19012 Primary osteoarthritis, left shoulder: Secondary | ICD-10-CM | POA: Diagnosis not present

## 2022-11-06 DIAGNOSIS — M461 Sacroiliitis, not elsewhere classified: Secondary | ICD-10-CM | POA: Diagnosis not present

## 2022-11-14 DIAGNOSIS — M25512 Pain in left shoulder: Secondary | ICD-10-CM | POA: Diagnosis not present

## 2022-11-15 ENCOUNTER — Other Ambulatory Visit: Payer: Self-pay | Admitting: Physician Assistant

## 2022-11-23 DIAGNOSIS — Z6828 Body mass index (BMI) 28.0-28.9, adult: Secondary | ICD-10-CM | POA: Diagnosis not present

## 2022-11-23 DIAGNOSIS — R768 Other specified abnormal immunological findings in serum: Secondary | ICD-10-CM | POA: Diagnosis not present

## 2022-11-23 DIAGNOSIS — M79642 Pain in left hand: Secondary | ICD-10-CM | POA: Diagnosis not present

## 2022-11-23 DIAGNOSIS — E663 Overweight: Secondary | ICD-10-CM | POA: Diagnosis not present

## 2022-11-23 DIAGNOSIS — M1991 Primary osteoarthritis, unspecified site: Secondary | ICD-10-CM | POA: Diagnosis not present

## 2022-11-23 DIAGNOSIS — Z1589 Genetic susceptibility to other disease: Secondary | ICD-10-CM | POA: Diagnosis not present

## 2022-11-23 DIAGNOSIS — M25512 Pain in left shoulder: Secondary | ICD-10-CM | POA: Diagnosis not present

## 2022-11-23 DIAGNOSIS — R5383 Other fatigue: Secondary | ICD-10-CM | POA: Diagnosis not present

## 2022-11-23 DIAGNOSIS — M79641 Pain in right hand: Secondary | ICD-10-CM | POA: Diagnosis not present

## 2022-11-27 DIAGNOSIS — M19012 Primary osteoarthritis, left shoulder: Secondary | ICD-10-CM | POA: Diagnosis not present

## 2022-11-29 ENCOUNTER — Ambulatory Visit (INDEPENDENT_AMBULATORY_CARE_PROVIDER_SITE_OTHER): Payer: Medicare Other | Admitting: Podiatry

## 2022-11-29 ENCOUNTER — Encounter: Payer: Self-pay | Admitting: Podiatry

## 2022-11-29 DIAGNOSIS — L600 Ingrowing nail: Secondary | ICD-10-CM

## 2022-11-29 DIAGNOSIS — B351 Tinea unguium: Secondary | ICD-10-CM

## 2022-11-29 DIAGNOSIS — M2041 Other hammer toe(s) (acquired), right foot: Secondary | ICD-10-CM | POA: Diagnosis not present

## 2022-11-29 DIAGNOSIS — M79674 Pain in right toe(s): Secondary | ICD-10-CM

## 2022-11-29 DIAGNOSIS — M79675 Pain in left toe(s): Secondary | ICD-10-CM

## 2022-11-29 NOTE — Patient Instructions (Signed)

## 2022-11-29 NOTE — Progress Notes (Signed)
Subjective:   Patient ID: [name], female   DOB: 77 y.o.   MRN: [id number]   HPI Patient presents with a damaged left big toenail that is been bothersome and sore been going on for a number of years several other nails that are bothersome and concerned about a digital deformity second toe right foot.  States that she would like to possibly have it removed but would not like it removed permanently.  Patient does not smoke likes to be active   Review of Systems  All other systems reviewed and are negative.       Objective:  Physical Exam Vitals and nursing note reviewed.  Constitutional:      Appearance: She is well-developed.  Pulmonary:     Effort: Pulmonary effort is normal.  Musculoskeletal:        General: Normal range of motion.  Skin:    General: Skin is warm.  Neurological:     Mental Status: She is alert.     Neurovascular status intact muscle strength found to be adequate range of motion adequate.  Patient is noted to have a damaged deformed right hallux toenail with clumping in the middle and discomfort digital deformity second right with slight dorsal medial rotation and several other nails on both feet that are thickened and can be bothersome.  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  I do think long-term removal of the nail is the best option for this patient and I reviewed that but if I do that I would prefer to do it permanently as I do not think it will grow back normally.  She is not comfortable with this and today we debrided nailbeds 1-5 of both feet and smooth and while it still tender she does not want to have this removed currently but may decide in the future to have it removed and again my recommendation is permanent but if she wanted temporary we could attempt

## 2022-11-30 DIAGNOSIS — I8311 Varicose veins of right lower extremity with inflammation: Secondary | ICD-10-CM | POA: Diagnosis not present

## 2022-11-30 DIAGNOSIS — I8312 Varicose veins of left lower extremity with inflammation: Secondary | ICD-10-CM | POA: Diagnosis not present

## 2022-11-30 DIAGNOSIS — D2372 Other benign neoplasm of skin of left lower limb, including hip: Secondary | ICD-10-CM | POA: Diagnosis not present

## 2022-11-30 DIAGNOSIS — I872 Venous insufficiency (chronic) (peripheral): Secondary | ICD-10-CM | POA: Diagnosis not present

## 2022-11-30 DIAGNOSIS — L738 Other specified follicular disorders: Secondary | ICD-10-CM | POA: Diagnosis not present

## 2022-11-30 DIAGNOSIS — L821 Other seborrheic keratosis: Secondary | ICD-10-CM | POA: Diagnosis not present

## 2022-11-30 DIAGNOSIS — D1801 Hemangioma of skin and subcutaneous tissue: Secondary | ICD-10-CM | POA: Diagnosis not present

## 2022-12-03 ENCOUNTER — Other Ambulatory Visit: Payer: Self-pay | Admitting: Physician Assistant

## 2022-12-08 ENCOUNTER — Other Ambulatory Visit: Payer: Self-pay

## 2022-12-08 MED ORDER — AMLODIPINE BESYLATE 5 MG PO TABS: 5.0000 mg | ORAL_TABLET | Freq: Every day | ORAL | 3 refills | Status: DC

## 2022-12-08 NOTE — Telephone Encounter (Signed)
Pt's medication was sent to pt's pharmacy as requested. Confirmation received.  °

## 2022-12-13 ENCOUNTER — Other Ambulatory Visit: Payer: Self-pay | Admitting: Physician Assistant

## 2022-12-19 ENCOUNTER — Other Ambulatory Visit: Payer: Self-pay | Admitting: *Deleted

## 2022-12-19 DIAGNOSIS — I83893 Varicose veins of bilateral lower extremities with other complications: Secondary | ICD-10-CM

## 2022-12-20 ENCOUNTER — Ambulatory Visit (INDEPENDENT_AMBULATORY_CARE_PROVIDER_SITE_OTHER): Payer: Medicare Other | Admitting: Vascular Surgery

## 2022-12-20 ENCOUNTER — Ambulatory Visit (HOSPITAL_COMMUNITY)
Admission: RE | Admit: 2022-12-20 | Discharge: 2022-12-20 | Disposition: A | Discharge status: Home / Self Care | Payer: Medicare Other | Source: Ambulatory Visit | Attending: Cardiovascular Disease | Admitting: Cardiovascular Disease

## 2022-12-20 ENCOUNTER — Encounter: Payer: Self-pay | Admitting: Vascular Surgery

## 2022-12-20 VITALS — BP 105/64 | HR 55 | Temp 98.4°F | Resp 14 | Ht 62.2 in | Wt 157.0 lb

## 2022-12-20 DIAGNOSIS — I83893 Varicose veins of bilateral lower extremities with other complications: Secondary | ICD-10-CM

## 2022-12-20 NOTE — Progress Notes (Signed)
REASON FOR VISIT:   Follow-up of chronic venous disease  MEDICAL ISSUES:   CHRONIC VENOUS DISEASE: This patient has reticular veins and telangiectasias bilaterally but no large truncal varicosities.  She had formal venous reflux testing of the left leg today which showed no significant superficial or deep venous reflux.  We have discussed the importance of intermittent leg elevation and the proper positioning for this.  I have encouraged her to avoid prolonged sitting and standing.  We have discussed the importance of exercise specifically walking and water aerobics.  We also discussed the importance of maintaining a healthy weight.  If she would like to have her spider veins and reticular veins addressed I think she would would be a candidate for sclerotherapy.  She has a lot of veins so this could get expensive given that this is not covered by insurance.  I have given her the number for Cherene Julian, RN if she decides she wants to pursue sclerotherapy.   HPI:   [name] is a pleasant 77 y.o. female who was seen by Dr. Lenell Antu on 09/25/2022 with chronic venous insufficiency.  Her symptoms were fairly consistent with symptoms of venous hypertension.  She did not have formal venous reflux testing.  He discussed with her the importance of exercise, elevation, and compression stockings.  She comes in for 50-month follow-up visit.  Since she was seen last her chief complaint is multiple spider veins of both lower extremities.  She states that her legs do get tired at the end of the day but she does not have any significant aching pain or heaviness to suggest she has significant venous hypertension.  She has had these spider veins for years.  Past Medical History:  Diagnosis Date   Allergy    seasonal   Ankylosing spondylitis (HCC)    Anxiety    Arthritis    Bell palsy    CAD (coronary artery disease)    a. Coronary CT 10/2016 showed calcium score in the 59th perecntile, moderate CAD in  RCA/PDA and narrowing of the mid to distal LAD, with negative FFR.   CTS (carpal tunnel syndrome)    Cystitis    Diabetes mellitus without complication (HCC)    Diverticulitis    Diverticulosis of colon    Dry eye syndrome    Exocrine pancreatic insufficiency    Frozen shoulder    GERD (gastroesophageal reflux disease)    HLA B27 (HLA B27 positive)    Hx of colonic polyps    Hyperlipidemia    NMR 2005   Hypertension    Hypothyroidism    Incontinence    Internal hemorrhoids    Interstitial cystitis    Kidney stone    Menopause    Mild aortic insufficiency    Mild ascending aorta dilatation (HCC)    Osteoarthritis    Osteopenia    [facility] [address]   Pre-diabetes    PSVT (paroxysmal supraventricular tachycardia)    S/P total hysterectomy and bilateral salpingo-oophorectomy 04/12/2014   Sinusitis    Sleep apnea    c-pap   Subjective visual disturbance of both eyes 12/30/2013   Tubular adenoma of colon    Vitamin D deficiency    Xerostomia    and Xeroophthalmia    Family History  Problem Relation Age of Onset   Coronary artery disease Mother    Hypertension Mother    Asthma Mother    Dementia Mother    Stroke Mother  mini cva   Diabetes Father    Cirrhosis Father        non alcoholic   Colon cancer Sister 39   Allergic rhinitis Sister    Allergic rhinitis Sister    Food Allergy Sister    Renal cancer Paternal Aunt        x 2   Coronary artery disease Maternal Grandfather    Breast cancer Maternal Aunt    Diabetes Paternal Aunt    Diabetes Paternal Grandmother    Esophageal cancer Neg Hx    Stomach cancer Neg Hx     SOCIAL HISTORY: Social History   Tobacco Use   Smoking status: Never   Smokeless tobacco: Never  Substance Use Topics   Alcohol use: Yes    Comment: social    Allergies  Allergen Reactions   Bactrim [Sulfamethoxazole-Trimethoprim] Other (See Comments)   Nitroglycerin Anaphylaxis    Hypotensive after NTG  administration in ER during chest pain evaluation Pt states she almost died,  Pt states in a procedure done in 10/2016 she had no reaction to this medication, so not sure if it is an actual allergy.    Pentosan Polysulfate Other (See Comments)    Elevated liver count Elevated liver enzymes Elevated LFTs.......Marland Kitchen elmiron  Other reaction(s): Not available   Pentosan Polysulfate Sodium     Elevated LFTs.......Marland Kitchen elmiron    Promethazine Hcl Other (See Comments)    "jittery on the inside"   Sulfamethoxazole-Trimethoprim Nausea Only    Unknown reaction per pt, Pt thinks she may have felt sick to her stomach.    Losartan Other (See Comments)    Pt states caused insomnia, tingling sensation in legs, lightheaded, stomach pain   Promethazine Hcl    Lisinopril Cough    Current Outpatient Medications  Medication Sig Dispense Refill   acetaminophen (TYLENOL) 500 MG tablet Take 1,000 mg by mouth 3 (three) times daily as needed.      amLODipine (NORVASC) 5 MG tablet Take 1 tablet (5 mg total) by mouth daily. 90 tablet 3   Ascorbic Acid (VITAMIN C) 1000 MG tablet Take 1,000 mg by mouth 2 (two) times daily.      aspirin 81 MG tablet Take 81 mg by mouth daily.     carvedilol (COREG) 3.125 MG tablet TAKE 1 TABLET BY MOUTH TWICE A DAY 60 tablet 11   chlorthalidone (HYGROTON) 25 MG tablet TAKE 1 TABLET BY MOUTH DAILY. PLEASE CALL [phone] TO SCHEDULE AN APPOINTMENT FOR FUTURE REFILLS. THANK YOU. 90 tablet 2   Cholecalciferol (VITAMIN D-3) 25 MCG (1000 UT) CAPS Take 10,000 Units by mouth daily.      diclofenac Sodium (VOLTAREN) 1 % GEL Apply topically 4 (four) times daily as needed.      dicyclomine (BENTYL) 10 MG capsule Take 1 to 2 tablets three times daily as needed 90 capsule 1   EPINEPHrine (EPIPEN 2-PAK) 0.3 mg/0.3 mL IJ SOAJ injection Use as directed for severe allergic reactions 2 each 1   MAGNESIUM GLYCINATE PO Take 240 mg by mouth daily.     meclizine (ANTIVERT) 25 MG tablet Take 0.5 tablets  (12.5 mg total) by mouth 2 (two) times daily as needed for dizziness. 30 tablet 3   mupirocin ointment (BACTROBAN) 2 % Apply 1 Application topically 2 (two) times daily. Into both nostrils bid x 5 days 22 g 0   naloxone (NARCAN) 4 MG/0.1ML LIQD nasal spray kit Narcan 4 mg/actuation nasal spray  PLACE 1 SPRAY INTO EACH NOSTRIL FOR  SUSPECTED OPIOID OVERDOSE. MAY REPEAT 2 - 3 MINUTES WITH NEW BOTTLE IN OTHER NOSTRIL     Omega-3 Fatty Acids (FISH OIL BURP-LESS) 1000 MG CAPS Takes 1000 mg BID 60 capsule 0   ondansetron (ZOFRAN) 4 MG tablet Take 1 tablet (4 mg total) by mouth every 8 (eight) hours as needed. 30 tablet 1   potassium chloride SA (KLOR-CON M20) 20 MEQ tablet Take 1 tablet (20 mEq total) by mouth daily. 90 tablet 3   rosuvastatin (CRESTOR) 5 MG tablet Take 1 tablet (5 mg total) by mouth daily. 30 tablet 11   vitamin B-12 (CYANOCOBALAMIN) 1000 MCG tablet Take 1,000 mcg by mouth 2 (two) times daily.      vitamin E 400 UNIT capsule Take 400 Units by mouth daily.     zinc gluconate 50 MG tablet Take 50 mg by mouth daily.     omeprazole (PRILOSEC) 40 MG capsule Take 1 capsule (40 mg total) by mouth daily. 90 capsule 4   No current facility-administered medications for this visit.    REVIEW OF SYSTEMS:  [X]  denotes positive finding, [ ]  denotes negative finding Cardiac  Comments:  Chest pain or chest pressure:    Shortness of breath upon exertion:    Short of breath when lying flat:    Irregular heart rhythm:        Vascular    Pain in calf, thigh, or hip brought on by ambulation:    Pain in feet at night that wakes you up from your sleep:     Blood clot in your veins:    Leg swelling:         Pulmonary    Oxygen at home:    Productive cough:     Wheezing:         Neurologic    Sudden weakness in arms or legs:     Sudden numbness in arms or legs:     Sudden onset of difficulty speaking or slurred speech:    Temporary loss of vision in one eye:     Problems with dizziness:          Gastrointestinal    Blood in stool:     Vomited blood:         Genitourinary    Burning when urinating:     Blood in urine:        Psychiatric    Major depression:         Hematologic    Bleeding problems:    Problems with blood clotting too easily:        Skin    Rashes or ulcers:        Constitutional    Fever or chills:     PHYSICAL EXAM:   Vitals:   12/20/22 1533  BP: 105/64  Pulse: (!) 55  Resp: 14  Temp: 98.4 F (36.9 C)  TempSrc: Temporal  SpO2: 96%  Weight: 157 lb (71.2 kg)  Height: 5' 2.2" (1.58 m)    GENERAL: The patient is a well-nourished female, in no acute distress. The vital signs are documented above. CARDIAC: There is a regular rate and rhythm.  VASCULAR: I do not detect carotid bruits. She has palpable pedal pulses. She has spider veins and reticular veins bilaterally.         PULMONARY: There is good air exchange bilaterally without wheezing or rales. ABDOMEN: Soft and non-tender with normal pitched bowel sounds.  MUSCULOSKELETAL: There are no major deformities or cyanosis. NEUROLOGIC:  No focal weakness or paresthesias are detected. SKIN: There are no ulcers or rashes noted. PSYCHIATRIC: The patient has a normal affect.  DATA:    VENOUS DUPLEX: I have independently interpreted her venous duplex scan today.  This was of the left lower extremity only.  There was no evidence of DVT.  There was no deep venous reflux.  There was no significant superficial venous reflux.  Waverly [facility] [phone]

## 2022-12-21 NOTE — Progress Notes (Signed)
Subjective:    Patient ID: [name], female    DOB: 1946/05/14, 77 y.o.   MRN: [id number]      HPI [name] is here for No chief complaint on file.    Right wrist pain - hard to move -   Saturday prior to mothers day - mowed the lawn.  She has a battery-operated mower and there was nothing out of the ordinary with mowing the lawn.  No injury.  When she was done and was inside she noticed she had a large bruise in the distal anterior right lower arm/wrist region.  It was mildly tender to touch.  She iced it a couple of times.  Gradually the bruise went away.  This past weekend-5 days ago she noted she is not able to pinch normally with her first and second fingers, was unable to turn the jar or take a cap off or use her arm to push herself up.  The pain is located in the right lateral anterior wrist.  It does radiate into her first 2 fingers and a little up the arm.  She has a little bit of chronic numbness in her first 2-3 fingers but it seems a little bit worse.   Medications and allergies reviewed with patient and updated if appropriate.  Current Outpatient Medications on File Prior to Visit  Medication Sig Dispense Refill   acetaminophen (TYLENOL) 500 MG tablet Take 1,000 mg by mouth 3 (three) times daily as needed.      amLODipine (NORVASC) 5 MG tablet Take 1 tablet (5 mg total) by mouth daily. 90 tablet 3   Ascorbic Acid (VITAMIN C) 1000 MG tablet Take 1,000 mg by mouth 2 (two) times daily.      aspirin 81 MG tablet Take 81 mg by mouth daily.     carvedilol (COREG) 3.125 MG tablet TAKE 1 TABLET BY MOUTH TWICE A DAY 60 tablet 11   chlorthalidone (HYGROTON) 25 MG tablet TAKE 1 TABLET BY MOUTH DAILY. PLEASE CALL [phone] TO SCHEDULE AN APPOINTMENT FOR FUTURE REFILLS. THANK YOU. 90 tablet 2   Cholecalciferol (VITAMIN D-3) 25 MCG (1000 UT) CAPS Take 10,000 Units by mouth daily.      diclofenac Sodium (VOLTAREN) 1 % GEL Apply topically 4 (four) times daily as needed.       dicyclomine (BENTYL) 10 MG capsule Take 1 to 2 tablets three times daily as needed 90 capsule 1   EPINEPHrine (EPIPEN 2-PAK) 0.3 mg/0.3 mL IJ SOAJ injection Use as directed for severe allergic reactions 2 each 1   MAGNESIUM GLYCINATE PO Take 240 mg by mouth daily.     meclizine (ANTIVERT) 25 MG tablet Take 0.5 tablets (12.5 mg total) by mouth 2 (two) times daily as needed for dizziness. 30 tablet 3   mupirocin ointment (BACTROBAN) 2 % Apply 1 Application topically 2 (two) times daily. Into both nostrils bid x 5 days 22 g 0   naloxone (NARCAN) 4 MG/0.1ML LIQD nasal spray kit Narcan 4 mg/actuation nasal spray  PLACE 1 SPRAY INTO EACH NOSTRIL FOR SUSPECTED OPIOID OVERDOSE. MAY REPEAT 2 - 3 MINUTES WITH NEW BOTTLE IN OTHER NOSTRIL     Omega-3 Fatty Acids (FISH OIL BURP-LESS) 1000 MG CAPS Takes 1000 mg BID 60 capsule 0   ondansetron (ZOFRAN) 4 MG tablet Take 1 tablet (4 mg total) by mouth every 8 (eight) hours as needed. 30 tablet 1   potassium chloride SA (KLOR-CON M20) 20 MEQ tablet Take 1 tablet (20 mEq  total) by mouth daily. 90 tablet 3   rosuvastatin (CRESTOR) 5 MG tablet Take 1 tablet (5 mg total) by mouth daily. 30 tablet 11   vitamin B-12 (CYANOCOBALAMIN) 1000 MCG tablet Take 1,000 mcg by mouth 2 (two) times daily.      vitamin E 400 UNIT capsule Take 400 Units by mouth daily.     zinc gluconate 50 MG tablet Take 50 mg by mouth daily.     omeprazole (PRILOSEC) 40 MG capsule Take 1 capsule (40 mg total) by mouth daily. 90 capsule 4   No current facility-administered medications on file prior to visit.    Review of Systems     Objective:   Vitals:   12/22/22 1514  BP: 112/72  Pulse: 62  Resp: 14   BP Readings from Last 3 Encounters:  12/22/22 112/72  12/20/22 105/64  10/25/22 120/74   Wt Readings from Last 3 Encounters:  12/20/22 157 lb (71.2 kg)  10/25/22 158 lb (71.7 kg)  09/27/22 158 lb (71.7 kg)   Body mass index is 28.72 kg/m.    Physical Exam Constitutional:       General: She is not in acute distress.    Appearance: Normal appearance. She is not ill-appearing.  HENT:     Head: Normocephalic and atraumatic.  Musculoskeletal:     Comments: Mild tenderness and swelling in the lateral anterior right wrist.  Slight weakness in right hand.  Skin:    General: Skin is warm and dry.     Findings: No bruising or erythema.  Neurological:     Mental Status: She is alert.        DG Wrist Complete Right CLINICAL DATA:  Right wrist pain and tenderness for 2 weeks. No known injury.  EXAM: RIGHT WRIST - COMPLETE 3+ VIEW  COMPARISON:  Right hand radiographs 09/10/2019  FINDINGS: There is diffuse decreased bone mineralization. Unchanged moderate downsloping of the distal medial radius. 4 mm ulnar positive variance. Mild calcification overlying the triangular fibrocartilage complex. Diffuse high-grade cysts throughout the proximal to distal scaphoid are similar to prior. Moderate triscaphe and severe thumb carpometacarpal joint space narrowing with subchondral sclerosis and peripheral osteophytes.  Severe lateral aspect of the thumb interphalangeal joint space narrowing with moderate peripheral degenerative osteophytes.  No acute fracture is seen.  No dislocation.  IMPRESSION: 1. 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 High-grade chronic cystic changes throughout the scaphoid are similar to prior. 2. 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 3. Unchanged moderate downsloping of the distal medial radius. 4. Mild chondrocalcinosis of the triangular fibrocartilage complex.  Electronically Signed   By: Neita Garnet M.D.   On: 12/22/2022 16:04      Assessment & Plan:    See Problem List for Assessment and Plan of chronic medical problems.

## 2022-12-22 ENCOUNTER — Ambulatory Visit (INDEPENDENT_AMBULATORY_CARE_PROVIDER_SITE_OTHER): Payer: Medicare Other

## 2022-12-22 ENCOUNTER — Encounter: Payer: Self-pay | Admitting: Internal Medicine

## 2022-12-22 ENCOUNTER — Ambulatory Visit (INDEPENDENT_AMBULATORY_CARE_PROVIDER_SITE_OTHER): Payer: Medicare Other | Admitting: Internal Medicine

## 2022-12-22 VITALS — BP 112/72 | HR 62 | Resp 14 | Ht 62.0 in

## 2022-12-22 DIAGNOSIS — M25531 Pain in right wrist: Secondary | ICD-10-CM | POA: Diagnosis not present

## 2022-12-22 DIAGNOSIS — I10 Essential (primary) hypertension: Secondary | ICD-10-CM | POA: Diagnosis not present

## 2022-12-22 MED ORDER — IBUPROFEN 800 MG PO TABS: 800.0000 mg | ORAL_TABLET | Freq: Every evening | ORAL | 0 refills | Status: DC | PRN

## 2022-12-22 NOTE — Assessment & Plan Note (Signed)
Chronic Controlled Continue amlodipine 5 mg daily, coreg 3.125 mg bid

## 2022-12-22 NOTE — Assessment & Plan Note (Signed)
Acute No specific injury Right lateral anterior wrist swelling, pain and some weakness and radiating pain in the hand and up the arm Possible tendinitis, ligament injury Will get x-ray today-showed no fracture, but arthritis Nothing seem to help the pain-she did take one of her daughters ibuprofen and it was Annabelle Harman allowed her to rest She does have CKD so would like to avoid NSAIDs, but because of the pain I will give her 5 pills to get her through to see sports medicine next week She does have a wrist brace and will continue to wear that until she sees sports medicine

## 2022-12-22 NOTE — Patient Instructions (Addendum)
     Have an xray downstairs.  Dr Katrinka Blazing 2:45 ?  Right wrist pain     Medications changes include :   none      Return if symptoms worsen or fail to improve.

## 2022-12-26 ENCOUNTER — Other Ambulatory Visit: Payer: Self-pay

## 2022-12-26 ENCOUNTER — Ambulatory Visit (INDEPENDENT_AMBULATORY_CARE_PROVIDER_SITE_OTHER): Payer: Medicare Other | Admitting: Family Medicine

## 2022-12-26 ENCOUNTER — Encounter: Payer: Self-pay | Admitting: Family Medicine

## 2022-12-26 VITALS — BP 124/82 | HR 69 | Ht 62.0 in

## 2022-12-26 DIAGNOSIS — M25531 Pain in right wrist: Secondary | ICD-10-CM | POA: Diagnosis not present

## 2022-12-26 DIAGNOSIS — M1811 Unilateral primary osteoarthritis of first carpometacarpal joint, right hand: Secondary | ICD-10-CM | POA: Diagnosis not present

## 2022-12-26 NOTE — Patient Instructions (Addendum)
Wear brace day and night for 2 weeks You have 14 days to return or exchange your brace Call [phone], then return the brace to our office  Injection in wrist today Ice, arnica gel, or Voltaren See you again in 6-8 weeks

## 2022-12-26 NOTE — Progress Notes (Signed)
Cardiology Office Note    Date:  12/28/2022   ID:  Charco, Contreraz [birth year], MRN [id number]  PCP:  Pincus Sanes, MD  Cardiologist:  Previously Dr. Delton See, now follows with me per patient request Electrophysiologist:  None   Chief Complaint: f/u CAD  History of Present Illness:   [name] is a 77 y.o. female with history of moderate CAD by coronary CT, aortic atherosclerosis, mild AI with mildly dilated ascending aorta, HTN, PSVT, PACs, PVCs, ankylosing spondylitis/HLA B27 positive, anxiety, osteoarthritis, Bell's palsy, diverticulosis, pancreatic insufficiency, HLD, HTN, interstitial cystitis, OA, DM, OSA on CPAP (followed at [facility]), mild anemia, varicose veins who presents for f/u. She has requested close cardiology follow-up on a regular basis. Her husband Gabriel Rung (who has a complex medical history and for whom she is primary caregiver) and daughter Natalia Leatherwood are also followed by our clinic.   She previously followed with Dr. Delton See for history of atypical sharp chest pain and nonobstructive CAD. Last ischemic assessment was by coronary CTA in 07/2020 with moderate stenosis in the prox and distal RCA, mild stenosis in the prox LAD with negative FFR, no abnormalities in aorta. Event monitor 2022 showed NSR with short runs of SVT, rare PVCs, PACs, average HR 75bpm, range 42-176 (low reading was during sleeping hours). At that time, palpitations resolved without intervention. In a previous year she had reported lower extremity edema and eye puffiness. Amlodipine was changed to HCTZ but eventually switched back at a later time by primary care as she had muscle cramping. In 06/2021 she wished to revisit diuretic for trace edema so was trialed on spironolactone but felt poorly with this (felt like she was not able to void) so went back to amlodipine. At OV 09/2021, she reported intermittent fleeting dizziness prompting repeat monitor showing predominantly NSR with average HR 75bpm, with 20  nonsustained SVT events (longest 14 beats), <1% PACs/PVCs, triggered events mostly correlated with NSR, occasionally with PACS/PVCs and 1 run of SVT - no sustained arrhythmias or pauses were noted. I had suggested initiation of carvedilol to address both SVT and elevated BP. At f/u 6/023 she reported continued dizziness/disequilibrium and new thought disturbances so was sent to ER for CVA r/o with reassuring workup including CTA head/neck. Meclizine was rx'd and recommended to f/u neurology. Chlorthalidone added in 01/2022 for BP. She previously followed with the lipid clinic in the past for cholesterol as well. She has a history of mylagias/RLS-type neuropathy although it was not clear this was specifically related to statin itself since this also occurred while off statin in the past. LE arterial testing 2020 was normal. She was also noted to previously have mildly elevated CK which normalized off statin earlier this year. She saw pharmD in follow-up 02/2022 given issues with statin/CK and plan to re-trial low dose rosuvastatin with close 6 week lab follow-up. F/u LDL was 46, CK mildly elevated at 220, pharmD felt OK to continue. Repeat echo 06/2022 EF 60-65%, G1DD, mild AI, mild dilation of ascending aorta. She saw [facility] for varicose veins 12/20/22 who recommended conservative measures and gave her contact info for sclerotherapy if she desired though would likely be expensive. She is also following with sports med for her wrist. A1c in 09/2022 was 6.7 in setting of eating more sweets; PCP managing.  She is seen for follow-up today doing well without any acute complaints. Blood pressure has been well controlled. She still has periodic leg pains but again we are unsure if related to  statin as they previously occurred off statin as well. Overall from cardiac standpoint reports she is doing well.   Labwork independently reviewed: 09/2022 A1C 6.7, K 4.3, Cr 1.16, AST ALT OK, ESR neg, LDL 44, trig 127, Hgb 12.9, plt  wnl 08/2022 Mg 2.1  Past History   Past Medical History:  Diagnosis Date   Allergy    seasonal   Ankylosing spondylitis (HCC)    Anxiety    Arthritis    Bell palsy    CAD (coronary artery disease)    a. Coronary CT 10/2016 showed calcium score in the 59th perecntile, moderate CAD in RCA/PDA and narrowing of the mid to distal LAD, with negative FFR.   CTS (carpal tunnel syndrome)    Cystitis    Diabetes mellitus without complication (HCC)    Diverticulitis    Diverticulosis of colon    Dry eye syndrome    Exocrine pancreatic insufficiency    Frozen shoulder    GERD (gastroesophageal reflux disease)    HLA B27 (HLA B27 positive)    Hx of colonic polyps    Hyperlipidemia    NMR 2005   Hypertension    Hypothyroidism    Incontinence    Internal hemorrhoids    Interstitial cystitis    Kidney stone    Menopause    Mild aortic insufficiency    Mild ascending aorta dilatation (HCC)    Osteoarthritis    Osteopenia    [facility] [address]   Pre-diabetes    PSVT (paroxysmal supraventricular tachycardia)    S/P total hysterectomy and bilateral salpingo-oophorectomy 04/12/2014   Sinusitis    Sleep apnea    c-pap   Subjective visual disturbance of both eyes 12/30/2013   Tubular adenoma of colon    Vitamin D deficiency    Xerostomia    and Xeroophthalmia    Past Surgical History:  Procedure Laterality Date   BLADDER SURGERY     for incontinence   CARPAL TUNNEL RELEASE Left 12/22/2014   Procedure: LEFT CARPAL TUNNEL RELEASE;  Surgeon: Cindee Salt, MD;  Location: [facility];  Service: Orthopedics;  Laterality: Left;   CARPAL TUNNEL RELEASE Right 02/18/2019   Procedure: RIGHT CARPAL TUNNEL RELEASE;  Surgeon: Cindee Salt, MD;  Location: [facility];  Service: Orthopedics;  Laterality: Right;   cataract excision Right 2022   COLONOSCOPY W/ POLYPECTOMY     adenomatous poyp 04-2008   colonscopy     tics 10-2002   CYSTOSCOPY WITH  RETROGRADE PYELOGRAM, URETEROSCOPY AND STENT PLACEMENT Left 09/01/2013   Procedure: CYSTOSCOPY WITH RETROGRADE PYELOGRAM, AND LEFT STENT PLACEMENT;  Surgeon: Milford Cage, MD;  Location: [facility];  Service: Urology;  Laterality: Left;   FINGER SURGERY Left 2004   2nd finger   G 1 P 1     interstitial cystitis     with clot in catheter   PARTIAL HYSTERECTOMY     with one ovary left   SHOULDER ARTHROSCOPY Left    TOTAL ABDOMINAL HYSTERECTOMY W/ BILATERAL SALPINGOOPHORECTOMY     dysfunctional menses    Current Medications: Current Meds  Medication Sig   acetaminophen (TYLENOL) 500 MG tablet Take 1,000 mg by mouth 3 (three) times daily as needed.    amLODipine (NORVASC) 5 MG tablet Take 1 tablet (5 mg total) by mouth daily.   Ascorbic Acid (VITAMIN C) 1000 MG tablet Take 1,000 mg by mouth 2 (two) times daily.    aspirin 81 MG tablet  Take 81 mg by mouth daily.   carvedilol (COREG) 3.125 MG tablet TAKE 1 TABLET BY MOUTH TWICE A DAY   chlorthalidone (HYGROTON) 25 MG tablet TAKE 1 TABLET BY MOUTH DAILY. PLEASE CALL [phone] TO SCHEDULE AN APPOINTMENT FOR FUTURE REFILLS. THANK YOU.   Cholecalciferol (VITAMIN D-3) 25 MCG (1000 UT) CAPS Take 10,000 Units by mouth daily.    diclofenac Sodium (VOLTAREN) 1 % GEL Apply topically 4 (four) times daily as needed.    dicyclomine (BENTYL) 10 MG capsule Take 1 to 2 tablets three times daily as needed   EPINEPHrine (EPIPEN 2-PAK) 0.3 mg/0.3 mL IJ SOAJ injection Use as directed for severe allergic reactions   ibuprofen (ADVIL) 800 MG tablet Take 1 tablet (800 mg total) by mouth at bedtime as needed.   MAGNESIUM GLYCINATE PO Take 240 mg by mouth daily.   meclizine (ANTIVERT) 25 MG tablet Take 0.5 tablets (12.5 mg total) by mouth 2 (two) times daily as needed for dizziness.   mupirocin ointment (BACTROBAN) 2 % Apply 1 Application topically 2 (two) times daily. Into both nostrils bid x 5 days   naloxone (NARCAN) 4 MG/0.1ML LIQD nasal spray kit Narcan  4 mg/actuation nasal spray  PLACE 1 SPRAY INTO EACH NOSTRIL FOR SUSPECTED OPIOID OVERDOSE. MAY REPEAT 2 - 3 MINUTES WITH NEW BOTTLE IN OTHER NOSTRIL   Omega-3 Fatty Acids (FISH OIL BURP-LESS) 1000 MG CAPS Takes 1000 mg BID   omeprazole (PRILOSEC) 40 MG capsule Take 1 capsule (40 mg total) by mouth daily.   ondansetron (ZOFRAN) 4 MG tablet Take 1 tablet (4 mg total) by mouth every 8 (eight) hours as needed.   potassium chloride SA (KLOR-CON M20) 20 MEQ tablet Take 1 tablet (20 mEq total) by mouth daily.   rosuvastatin (CRESTOR) 5 MG tablet Take 1 tablet (5 mg total) by mouth daily.   vitamin B-12 (CYANOCOBALAMIN) 1000 MCG tablet Take 1,000 mcg by mouth 2 (two) times daily.    vitamin E 400 UNIT capsule Take 400 Units by mouth daily.   zinc gluconate 50 MG tablet Take 50 mg by mouth daily.      Allergies:   Bactrim [sulfamethoxazole-trimethoprim], Nitroglycerin, Pentosan polysulfate, Pentosan polysulfate sodium, Promethazine hcl, Sulfamethoxazole-trimethoprim, Losartan, Promethazine hcl, and Lisinopril   Social History   Socioeconomic History   Marital status: Married    Spouse name: Not on file   Number of children: 1   Years of education: BS   Highest education level: Not on file  Occupational History   Occupation: retired   Occupation: Runner, broadcasting/film/video    Comment: Kindergarten  Tobacco Use   Smoking status: Never   Smokeless tobacco: Never  Vaping Use   Vaping Use: Never used  Substance and Sexual Activity   Alcohol use: Yes    Comment: social   Drug use: No   Sexual activity: Not Currently  Other Topics Concern   Not on file  Social History Narrative   Lives at home with husband, is his caregiver   No daily use of caffeine.   Right-handed.         Social Determinants of Health   Financial Resource Strain: Low Risk  (03/17/2022)   Overall Financial Resource Strain (CARDIA)    Difficulty of Paying Living Expenses: Not hard at all  Food Insecurity: No Food Insecurity  (03/17/2022)   Hunger Vital Sign    Worried About Running Out of Food in the Last Year: Never true    Ran Out of Food in the Last  Year: Never true  Transportation Needs: No Transportation Needs (03/17/2022)   PRAPARE - Administrator, Civil Service (Medical): No    Lack of Transportation (Non-Medical): No  Physical Activity: Sufficiently Active (03/17/2022)   Exercise Vital Sign    Days of Exercise per Week: 7 days    Minutes of Exercise per Session: 30 min  Stress: No Stress Concern Present (03/17/2022)   [employer] of Occupational Health - Occupational Stress Questionnaire    Feeling of Stress : Not at all  Social Connections: Socially Integrated (03/17/2022)   Social Connection and Isolation Panel [NHANES]    Frequency of Communication with Friends and Family: More than three times a week    Frequency of Social Gatherings with Friends and Family: Once a week    Attends Religious Services: 1 to 4 times per year    Active Member of [employer] or Organizations: Yes    Attends Engineer, structural: 1 to 4 times per year    Marital Status: Married     Family History:  The patient's family history includes Allergic rhinitis in her sister and sister; Asthma in her mother; Breast cancer in her maternal aunt; Cirrhosis in her father; Colon cancer (age of onset: 40) in her sister; Coronary artery disease in her maternal grandfather and mother; Dementia in her mother; Diabetes in her father, paternal aunt, and paternal grandmother; Food Allergy in her sister; Hypertension in her mother; Renal cancer in her paternal aunt; Stroke in her mother. There is no history of Esophageal cancer or Stomach cancer.  ROS:   Please see the history of present illness.  All other systems are reviewed and otherwise negative.    EKG(s)/Additional Testing   EKG:  EKG is not ordered today  CV Studies: Cardiac studies reviewed are outlined and summarized above. Otherwise please see EMR for  full report.  Recent Labs: 01/18/2022: TSH 4.150 09/18/2022: Magnesium 2.1 10/25/2022: ALT 25; BUN 19; Creatinine, Ser 1.16; Hemoglobin 12.9; Platelets 275.0; Potassium 4.3; Sodium 139  Recent Lipid Panel    Component Value Date/Time   CHOL 131 10/25/2022 0851   CHOL 123 04/17/2022 0852   TRIG 127.0 10/25/2022 0851   TRIG 181 (H) 07/23/2006 1101   HDL 61.40 10/25/2022 0851   HDL 60 04/17/2022 0852   CHOLHDL 2 10/25/2022 0851   VLDL 25.4 10/25/2022 0851   LDLCALC 44 10/25/2022 0851   LDLCALC 46 04/17/2022 0852   LDLDIRECT 114.0 05/02/2016 0919    PHYSICAL EXAM:    VS:  BP 122/70   Pulse 72   Ht 5' 2.5" (1.588 m)   Wt 157 lb (71.2 kg)   SpO2 97%   BMI 28.26 kg/m   BMI: Body mass index is 28.26 kg/m.  GEN: Well nourished, well developed female in no acute distress HEENT: normocephalic, atraumatic Neck: no JVD, carotid bruits, or masses Cardiac: RRR; no murmurs, rubs, or gallops, no significant pitting edema, + varicosities BLE Respiratory:  clear to auscultation bilaterally, normal work of breathing GI: soft, nontender, nondistended, + BS MS: no deformity or atrophy Skin: warm and dry, no rash Neuro:  Alert and Oriented x 3, Strength and sensation are intact, follows commands Psych: euthymic mood, full affect  Wt Readings from Last 3 Encounters:  12/28/22 157 lb (71.2 kg)  12/20/22 157 lb (71.2 kg)  10/25/22 158 lb (71.7 kg)     ASSESSMENT & PLAN:   1. Essential HTN - well controlled on present regimen. Continue current regimen.  2. Aortic insufficiency with mild dilation of ascending aorta - no accelerating murmur. Plan echocardiogram 07/2023 as previously outlined. Will see back after to discuss.   3. CAD - doing well without progressive angina. Continue ASA, BB, rosuvastatin, amlodipine. We discussed trial of changing rosuvastatin to pravastatin to see if this helps with leg cramps (remotely suggested if needed from pharmD team). As above, in the past, it was not  clear this was at all related to statin since it even persisted off rosuvastatin. ABIs were previously normal as well. May be related to varicose veins. She is satisfied with the rosuvastatin for now so will continue. Recent LDL looked great. She will notify for any worsening symptoms.  This was a focused visit, no refills needed. Remainder of cardiac issues appear stable.    Disposition: F/u with me after 07/2023 echocardiogram.   Medication Adjustments/Labs and Tests Ordered: Current medicines are reviewed at length with the patient today.  Concerns regarding medicines are outlined above. Medication changes, Labs and Tests ordered today are summarized above and listed in the Patient Instructions accessible in Encounters.   Signed, Laurann Montana, PA-C  12/28/2022 1:52 PM    [facility] Phone: [phone]; Fax: [phone]

## 2022-12-26 NOTE — Progress Notes (Signed)
[facility] [address] [place] [address] Phone: [phone] Subjective:   INadine Counts, am serving as a scribe for Dr. Antoine Primas.  I'm seeing this patient by the request  of:  Pincus Sanes, MD  CC: Right wrist pain  FAO:[id number]  [name] is a 77 y.o. female coming in with complaint of right wrist pain.  Did see primary care provider 4 days ago for this problem.  Has been having brace.  Unable to take anti-inflammatories secondary to chronic kidney disease. Still some motions hurt, twisting and unable to pinch. Aches constantly.    Patient did have x-rays taken at last exam that were independently visualized by me showing the patient does have moderate arthritic changes of the wrist itself as well as severe CMC joint arthritis.  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  Past Medical History:  Diagnosis Date   Allergy    seasonal   Ankylosing spondylitis (HCC)    Anxiety    Arthritis    Bell palsy    CAD (coronary artery disease)    a. Coronary CT 10/2016 showed calcium score in the 59th perecntile, moderate CAD in RCA/PDA and narrowing of the mid to distal LAD, with negative FFR.   CTS (carpal tunnel syndrome)    Cystitis    Diabetes mellitus without complication (HCC)    Diverticulitis    Diverticulosis of colon    Dry eye syndrome    Exocrine pancreatic insufficiency    Frozen shoulder    GERD (gastroesophageal reflux disease)    HLA B27 (HLA B27 positive)    Hx of colonic polyps    Hyperlipidemia    NMR 2005   Hypertension    Hypothyroidism    Incontinence    Internal hemorrhoids    Interstitial cystitis    Kidney stone    Menopause    Mild aortic insufficiency    Mild ascending aorta dilatation (HCC)    Osteoarthritis    Osteopenia    [facility] [address]   Pre-diabetes    PSVT (paroxysmal supraventricular tachycardia)    S/P total hysterectomy and bilateral  salpingo-oophorectomy 04/12/2014   Sinusitis    Sleep apnea    c-pap   Subjective visual disturbance of both eyes 12/30/2013   Tubular adenoma of colon    Vitamin D deficiency    Xerostomia    and Xeroophthalmia   Past Surgical History:  Procedure Laterality Date   BLADDER SURGERY     for incontinence   CARPAL TUNNEL RELEASE Left 12/22/2014   Procedure: LEFT CARPAL TUNNEL RELEASE;  Surgeon: Cindee Salt, MD;  Location: [facility];  Service: Orthopedics;  Laterality: Left;   CARPAL TUNNEL RELEASE Right 02/18/2019   Procedure: RIGHT CARPAL TUNNEL RELEASE;  Surgeon: Cindee Salt, MD;  Location: [facility];  Service: Orthopedics;  Laterality: Right;   cataract excision Right 2022   COLONOSCOPY W/ POLYPECTOMY     adenomatous poyp 04-2008   colonscopy     tics 10-2002   CYSTOSCOPY WITH RETROGRADE PYELOGRAM, URETEROSCOPY AND STENT PLACEMENT Left 09/01/2013   Procedure: CYSTOSCOPY WITH RETROGRADE PYELOGRAM, AND LEFT STENT PLACEMENT;  Surgeon: Milford Cage, MD;  Location: [facility];  Service: Urology;  Laterality: Left;   FINGER SURGERY Left 2004   2nd finger   G 1 P 1     interstitial cystitis     with clot in catheter   PARTIAL  HYSTERECTOMY     with one ovary left   SHOULDER ARTHROSCOPY Left    TOTAL ABDOMINAL HYSTERECTOMY W/ BILATERAL SALPINGOOPHORECTOMY     dysfunctional menses   Social History   Socioeconomic History   Marital status: Married    Spouse name: Not on file   Number of children: 1   Years of education: BS   Highest education level: Not on file  Occupational History   Occupation: retired   Occupation: Runner, broadcasting/film/video    Comment: Kindergarten  Tobacco Use   Smoking status: Never   Smokeless tobacco: Never  Vaping Use   Vaping Use: Never used  Substance and Sexual Activity   Alcohol use: Yes    Comment: social   Drug use: No   Sexual activity: Not Currently  Other Topics Concern   Not on file  Social History Narrative   Lives at  home with husband, is his caregiver   No daily use of caffeine.   Right-handed.         Social Determinants of Health   Financial Resource Strain: Low Risk  (03/17/2022)   Overall Financial Resource Strain (CARDIA)    Difficulty of Paying Living Expenses: Not hard at all  Food Insecurity: No Food Insecurity (03/17/2022)   Hunger Vital Sign    Worried About Running Out of Food in the Last Year: Never true    Ran Out of Food in the Last Year: Never true  Transportation Needs: No Transportation Needs (03/17/2022)   PRAPARE - Administrator, Civil Service (Medical): No    Lack of Transportation (Non-Medical): No  Physical Activity: Sufficiently Active (03/17/2022)   Exercise Vital Sign    Days of Exercise per Week: 7 days    Minutes of Exercise per Session: 30 min  Stress: No Stress Concern Present (03/17/2022)   [employer] of Occupational Health - Occupational Stress Questionnaire    Feeling of Stress : Not at all  Social Connections: Socially Integrated (03/17/2022)   Social Connection and Isolation Panel [NHANES]    Frequency of Communication with Friends and Family: More than three times a week    Frequency of Social Gatherings with Friends and Family: Once a week    Attends Religious Services: 1 to 4 times per year    Active Member of [employer] or Organizations: Yes    Attends Banker Meetings: 1 to 4 times per year    Marital Status: Married   Allergies  Allergen Reactions   Bactrim [Sulfamethoxazole-Trimethoprim] Other (See Comments)   Nitroglycerin Anaphylaxis    Hypotensive after NTG administration in ER during chest pain evaluation Pt states she almost died,  Pt states in a procedure done in 10/2016 she had no reaction to this medication, so not sure if it is an actual allergy.    Pentosan Polysulfate Other (See Comments)    Elevated liver count Elevated liver enzymes Elevated LFTs.......Marland Kitchen elmiron  Other reaction(s): Not available   Pentosan  Polysulfate Sodium     Elevated LFTs.......Marland Kitchen elmiron    Promethazine Hcl Other (See Comments)    "jittery on the inside"   Sulfamethoxazole-Trimethoprim Nausea Only    Unknown reaction per pt, Pt thinks she may have felt sick to her stomach.    Losartan Other (See Comments)    Pt states caused insomnia, tingling sensation in legs, lightheaded, stomach pain   Promethazine Hcl    Lisinopril Cough   Family History  Problem Relation Age of Onset   Coronary artery  disease Mother    Hypertension Mother    Asthma Mother    Dementia Mother    Stroke Mother        mini cva   Diabetes Father    Cirrhosis Father        non alcoholic   Colon cancer Sister 33   Allergic rhinitis Sister    Allergic rhinitis Sister    Food Allergy Sister    Renal cancer Paternal Aunt        x 2   Coronary artery disease Maternal Grandfather    Breast cancer Maternal Aunt    Diabetes Paternal Aunt    Diabetes Paternal Grandmother    Esophageal cancer Neg Hx    Stomach cancer Neg Hx      Current Outpatient Medications (Cardiovascular):    amLODipine (NORVASC) 5 MG tablet, Take 1 tablet (5 mg total) by mouth daily.   carvedilol (COREG) 3.125 MG tablet, TAKE 1 TABLET BY MOUTH TWICE A DAY   chlorthalidone (HYGROTON) 25 MG tablet, TAKE 1 TABLET BY MOUTH DAILY. PLEASE CALL [phone] TO SCHEDULE AN APPOINTMENT FOR FUTURE REFILLS. THANK YOU.   EPINEPHrine (EPIPEN 2-PAK) 0.3 mg/0.3 mL IJ SOAJ injection, Use as directed for severe allergic reactions   rosuvastatin (CRESTOR) 5 MG tablet, Take 1 tablet (5 mg total) by mouth daily.   Current Outpatient Medications (Analgesics):    acetaminophen (TYLENOL) 500 MG tablet, Take 1,000 mg by mouth 3 (three) times daily as needed.    aspirin 81 MG tablet, Take 81 mg by mouth daily.   ibuprofen (ADVIL) 800 MG tablet, Take 1 tablet (800 mg total) by mouth at bedtime as needed.  Current Outpatient Medications (Hematological):    vitamin B-12 (CYANOCOBALAMIN) 1000 MCG  tablet, Take 1,000 mcg by mouth 2 (two) times daily.   Current Outpatient Medications (Other):    Ascorbic Acid (VITAMIN C) 1000 MG tablet, Take 1,000 mg by mouth 2 (two) times daily.    Cholecalciferol (VITAMIN D-3) 25 MCG (1000 UT) CAPS, Take 10,000 Units by mouth daily.    diclofenac Sodium (VOLTAREN) 1 % GEL, Apply topically 4 (four) times daily as needed.    dicyclomine (BENTYL) 10 MG capsule, Take 1 to 2 tablets three times daily as needed   MAGNESIUM GLYCINATE PO, Take 240 mg by mouth daily.   meclizine (ANTIVERT) 25 MG tablet, Take 0.5 tablets (12.5 mg total) by mouth 2 (two) times daily as needed for dizziness.   mupirocin ointment (BACTROBAN) 2 %, Apply 1 Application topically 2 (two) times daily. Into both nostrils bid x 5 days   naloxone (NARCAN) 4 MG/0.1ML LIQD nasal spray kit, Narcan 4 mg/actuation nasal spray  PLACE 1 SPRAY INTO EACH NOSTRIL FOR SUSPECTED OPIOID OVERDOSE. MAY REPEAT 2 - 3 MINUTES WITH NEW BOTTLE IN OTHER NOSTRIL   Omega-3 Fatty Acids (FISH OIL BURP-LESS) 1000 MG CAPS, Takes 1000 mg BID   omeprazole (PRILOSEC) 40 MG capsule, Take 1 capsule (40 mg total) by mouth daily.   ondansetron (ZOFRAN) 4 MG tablet, Take 1 tablet (4 mg total) by mouth every 8 (eight) hours as needed.   potassium chloride SA (KLOR-CON M20) 20 MEQ tablet, Take 1 tablet (20 mEq total) by mouth daily.   vitamin E 400 UNIT capsule, Take 400 Units by mouth daily.   zinc gluconate 50 MG tablet, Take 50 mg by mouth daily.   Reviewed prior external information including notes and imaging from  primary care provider As well as notes that were available from  care everywhere and other healthcare systems.  Past medical history, social, surgical and family history all reviewed in electronic medical record.  No pertanent information unless stated regarding to the chief complaint.   Review of Systems:  No headache, visual changes, nausea, vomiting, diarrhea, constipation, dizziness, abdominal pain, skin  rash, fevers, chills, night sweats, weight loss, swollen lymph nodes, body aches, joint swelling, chest pain, shortness of breath, mood changes. POSITIVE muscle aches  Objective  Blood pressure 124/82, pulse 69, height 5\' 2"  (1.575 m), SpO2 94 %.   General: No apparent distress alert and oriented x3 mood and affect normal, dressed appropriately.  HEENT: Pupils equal, extraocular movements intact  Respiratory: Patient's speak in full sentences and does not appear short of breath  Cardiovascular: No lower extremity edema, non tender, no erythema  Worsening, significantly swelling on the right side compared to left.  Mild warmth to palpation.  No erythema noted.  Tender to palpation.  Positive grind test noted   Procedure: Real-time Ultrasound Guided Injection of right CMC joint Device: GE Logiq Q7 Ultrasound guided injection is preferred based studies that show increased duration, increased effect, greater accuracy, decreased procedural pain, increased response rate, and decreased cost with ultrasound guided versus blind injection.  Verbal informed consent obtained.  Time-out conducted.  Noted no overlying erythema, induration, or other signs of local infection.  Skin prepped in a sterile fashion.  Local anesthesia: Topical Ethyl chloride.  With sterile technique and under real time ultrasound guidance: With a 25-gauge half inch needle injected with 0.5 cc of 0.5% Marcaine and 0.5 cc of Kenalog 40 mg per mL Completed without difficulty  Pain immediately resolved suggesting accurate placement of the medication.  Advised to call if fevers/chills, erythema, induration, drainage, or persistent bleeding.  Impression: Technically successful ultrasound guided injection.     Impression and Recommendations:     The above documentation has been reviewed and is accurate and complete Judi Saa, DO

## 2022-12-26 NOTE — Assessment & Plan Note (Signed)
Patient given injection and tolerated the procedure well.  Discussed icing regimen and home exercises  Increase activity slowly.  Follow-up again in 6 to 8 weeks Exos brace given today.

## 2022-12-28 ENCOUNTER — Encounter: Payer: Self-pay | Admitting: Physician Assistant

## 2022-12-28 ENCOUNTER — Ambulatory Visit: Payer: Medicare Other | Attending: Physician Assistant | Admitting: Physician Assistant

## 2022-12-28 VITALS — BP 122/70 | HR 72 | Ht 62.5 in | Wt 157.0 lb

## 2022-12-28 DIAGNOSIS — I493 Ventricular premature depolarization: Secondary | ICD-10-CM

## 2022-12-28 DIAGNOSIS — I491 Atrial premature depolarization: Secondary | ICD-10-CM

## 2022-12-28 DIAGNOSIS — E785 Hyperlipidemia, unspecified: Secondary | ICD-10-CM

## 2022-12-28 DIAGNOSIS — I77810 Thoracic aortic ectasia: Secondary | ICD-10-CM | POA: Diagnosis not present

## 2022-12-28 DIAGNOSIS — I471 Supraventricular tachycardia, unspecified: Secondary | ICD-10-CM

## 2022-12-28 DIAGNOSIS — I10 Essential (primary) hypertension: Secondary | ICD-10-CM | POA: Insufficient documentation

## 2022-12-28 DIAGNOSIS — I351 Nonrheumatic aortic (valve) insufficiency: Secondary | ICD-10-CM | POA: Diagnosis not present

## 2022-12-28 DIAGNOSIS — I2510 Atherosclerotic heart disease of native coronary artery without angina pectoris: Secondary | ICD-10-CM | POA: Insufficient documentation

## 2022-12-28 NOTE — Patient Instructions (Signed)
Medication Instructions:  Your physician recommends that you continue on your current medications as directed. Please refer to the Current Medication list given to you today.  *If you need a refill on your cardiac medications before your next appointment, please call your pharmacy*  Lab Work: None ordered today.  Testing/Procedures: Please schedule echocardiogram for December 2024.  Follow-Up: At [facility], you and your health needs are our priority.  As part of our continuing mission to provide you with exceptional heart care, we have created designated Provider Care Teams.  These Care Teams include your primary Cardiologist (physician) and Advanced Practice Providers (APPs -  Physician Assistants and Nurse Practitioners) who all work together to provide you with the care you need, when you need it.   Your next appointment:   8-9 month(s) (early January 2025 after echocardiogram)  The format for your next appointment:   In Person  Provider:   Ronie Spies, PA-C

## 2023-01-22 DIAGNOSIS — Z961 Presence of intraocular lens: Secondary | ICD-10-CM | POA: Diagnosis not present

## 2023-01-22 DIAGNOSIS — Z9841 Cataract extraction status, right eye: Secondary | ICD-10-CM | POA: Diagnosis not present

## 2023-01-22 DIAGNOSIS — Z9842 Cataract extraction status, left eye: Secondary | ICD-10-CM | POA: Diagnosis not present

## 2023-02-06 NOTE — Progress Notes (Unsigned)
Subjective:    Patient ID: [name], female    DOB: 01/30/1946, 77 y.o.   MRN: [id number]      HPI [name] is here for No chief complaint on file.   Muscle cramps -    Dehy, rls, back, elctro, chlorthal  Medications and allergies reviewed with patient and updated if appropriate.  Current Outpatient Medications on File Prior to Visit  Medication Sig Dispense Refill   acetaminophen (TYLENOL) 500 MG tablet Take 1,000 mg by mouth 3 (three) times daily as needed.      amLODipine (NORVASC) 5 MG tablet Take 1 tablet (5 mg total) by mouth daily. 90 tablet 3   Ascorbic Acid (VITAMIN C) 1000 MG tablet Take 1,000 mg by mouth 2 (two) times daily.      aspirin 81 MG tablet Take 81 mg by mouth daily.     carvedilol (COREG) 3.125 MG tablet TAKE 1 TABLET BY MOUTH TWICE A DAY 60 tablet 11   chlorthalidone (HYGROTON) 25 MG tablet TAKE 1 TABLET BY MOUTH DAILY. PLEASE CALL [phone] TO SCHEDULE AN APPOINTMENT FOR FUTURE REFILLS. THANK YOU. 90 tablet 2   Cholecalciferol (VITAMIN D-3) 25 MCG (1000 UT) CAPS Take 10,000 Units by mouth daily.      diclofenac Sodium (VOLTAREN) 1 % GEL Apply topically 4 (four) times daily as needed.      dicyclomine (BENTYL) 10 MG capsule Take 1 to 2 tablets three times daily as needed 90 capsule 1   EPINEPHrine (EPIPEN 2-PAK) 0.3 mg/0.3 mL IJ SOAJ injection Use as directed for severe allergic reactions 2 each 1   ibuprofen (ADVIL) 800 MG tablet Take 1 tablet (800 mg total) by mouth at bedtime as needed. 5 tablet 0   MAGNESIUM GLYCINATE PO Take 240 mg by mouth daily.     meclizine (ANTIVERT) 25 MG tablet Take 0.5 tablets (12.5 mg total) by mouth 2 (two) times daily as needed for dizziness. 30 tablet 3   mupirocin ointment (BACTROBAN) 2 % Apply 1 Application topically 2 (two) times daily. Into both nostrils bid x 5 days 22 g 0   naloxone (NARCAN) 4 MG/0.1ML LIQD nasal spray kit Narcan 4 mg/actuation nasal spray  PLACE 1 SPRAY INTO EACH NOSTRIL FOR SUSPECTED OPIOID  OVERDOSE. MAY REPEAT 2 - 3 MINUTES WITH NEW BOTTLE IN OTHER NOSTRIL     Omega-3 Fatty Acids (FISH OIL BURP-LESS) 1000 MG CAPS Takes 1000 mg BID 60 capsule 0   omeprazole (PRILOSEC) 40 MG capsule Take 1 capsule (40 mg total) by mouth daily. 90 capsule 4   ondansetron (ZOFRAN) 4 MG tablet Take 1 tablet (4 mg total) by mouth every 8 (eight) hours as needed. 30 tablet 1   potassium chloride SA (KLOR-CON M20) 20 MEQ tablet Take 1 tablet (20 mEq total) by mouth daily. 90 tablet 3   rosuvastatin (CRESTOR) 5 MG tablet Take 1 tablet (5 mg total) by mouth daily. 30 tablet 11   vitamin B-12 (CYANOCOBALAMIN) 1000 MCG tablet Take 1,000 mcg by mouth 2 (two) times daily.      vitamin E 400 UNIT capsule Take 400 Units by mouth daily.     zinc gluconate 50 MG tablet Take 50 mg by mouth daily.     No current facility-administered medications on file prior to visit.    Review of Systems     Objective:  There were no vitals filed for this visit. BP Readings from Last 3 Encounters:  12/28/22 122/70  12/26/22 124/82  12/22/22  112/72   Wt Readings from Last 3 Encounters:  12/28/22 157 lb (71.2 kg)  12/20/22 157 lb (71.2 kg)  10/25/22 158 lb (71.7 kg)   There is no height or weight on file to calculate BMI.    Physical Exam         Assessment & Plan:    See Problem List for Assessment and Plan of chronic medical problems.

## 2023-02-06 NOTE — Patient Instructions (Signed)
      Blood work was ordered.   The lab is on the first floor.    Medications changes include :       A referral was ordered and someone will call you to schedule an appointment.     No follow-ups on file.  

## 2023-02-07 ENCOUNTER — Encounter: Payer: Self-pay | Admitting: Internal Medicine

## 2023-02-07 ENCOUNTER — Ambulatory Visit (INDEPENDENT_AMBULATORY_CARE_PROVIDER_SITE_OTHER): Payer: Medicare Other | Admitting: Internal Medicine

## 2023-02-07 VITALS — BP 120/72 | HR 60 | Temp 98.1°F | Ht 62.5 in

## 2023-02-07 DIAGNOSIS — I10 Essential (primary) hypertension: Secondary | ICD-10-CM

## 2023-02-07 DIAGNOSIS — A499 Bacterial infection, unspecified: Secondary | ICD-10-CM

## 2023-02-07 DIAGNOSIS — R5383 Other fatigue: Secondary | ICD-10-CM | POA: Diagnosis not present

## 2023-02-07 DIAGNOSIS — R252 Cramp and spasm: Secondary | ICD-10-CM

## 2023-02-07 MED ORDER — VALSARTAN 40 MG PO TABS
40.0000 mg | ORAL_TABLET | Freq: Every day | ORAL | 3 refills | Status: DC
Start: 2023-02-07 — End: 2023-06-11

## 2023-02-07 NOTE — Assessment & Plan Note (Signed)
Chronic Controlled Continue amlodipine 5 mg daily, coreg 3.125 mg bid Will discontinue chlorthalidone/potassium-May be making leg cramping worse Start valsartan 40 mg daily-hopefully she will tolerate this Monitor BP Follow-up in 1 month

## 2023-02-07 NOTE — Assessment & Plan Note (Signed)
States feeling lousy-mostly related to fatigue Also states some brain fog Untreated OSA may be contributing-has not been using CPAP consistently for a month or so Stressed using CPAP consistently for the next month to see if that helps Encourage increase water intake Will check full blood work at her next visit although blood work over the past few months has been within normal range

## 2023-02-07 NOTE — Assessment & Plan Note (Signed)
Acute Has had an infection in the left buttock 2-3 times Possible cyst/fistula The area looks like it is recurring-no obvious infection so we will hold off on antibiotics unless it gets worse-she will let me know Refer back to surgery for further treatment

## 2023-02-07 NOTE — Assessment & Plan Note (Signed)
Acute on chronic Has had some leg cramping for a while, but this is worse in the past couple weeks or so Increase water intake which may help Already taking magnesium ?  Related to untreated OSA-not using CPAP regularly-encouraged her to use CPAP consistently for the next month to see if that helps Will discontinue chlorthalidone/potassium to see if that helps

## 2023-02-09 NOTE — Progress Notes (Deleted)
[facility] [address] [place] [address] Phone: [phone] Subjective:    I'm seeing this patient by the request  of:  Pincus Sanes, MD  CC:   [id number]  12/26/2022 Patient given injection and tolerated the procedure well.  Discussed icing regimen and home exercises   Increase activity slowly.  Follow-up again in 6 to 8 weeks Exos brace given today.     Updated 02/14/2023 [name] is a 77 y.o. female coming in with complaint of R wrist pain       Past Medical History:  Diagnosis Date   Allergy    seasonal   Ankylosing spondylitis (HCC)    Anxiety    Arthritis    Bell palsy    CAD (coronary artery disease)    a. Coronary CT 10/2016 showed calcium score in the 59th perecntile, moderate CAD in RCA/PDA and narrowing of the mid to distal LAD, with negative FFR.   CTS (carpal tunnel syndrome)    Cystitis    Diabetes mellitus without complication (HCC)    Diverticulitis    Diverticulosis of colon    Dry eye syndrome    Exocrine pancreatic insufficiency    Frozen shoulder    GERD (gastroesophageal reflux disease)    HLA B27 (HLA B27 positive)    Hx of colonic polyps    Hyperlipidemia    NMR 2005   Hypertension    Hypothyroidism    Incontinence    Internal hemorrhoids    Interstitial cystitis    Kidney stone    Menopause    Mild aortic insufficiency    Mild ascending aorta dilatation (HCC)    Osteoarthritis    Osteopenia    [facility] [address]   Pre-diabetes    PSVT (paroxysmal supraventricular tachycardia)    S/P total hysterectomy and bilateral salpingo-oophorectomy 04/12/2014   Sinusitis    Sleep apnea    c-pap   Subjective visual disturbance of both eyes 12/30/2013   Tubular adenoma of colon    Vitamin D deficiency    Xerostomia    and Xeroophthalmia   Past Surgical History:  Procedure Laterality Date   BLADDER SURGERY     for incontinence   CARPAL TUNNEL RELEASE Left 12/22/2014    Procedure: LEFT CARPAL TUNNEL RELEASE;  Surgeon: Cindee Salt, MD;  Location: [facility];  Service: Orthopedics;  Laterality: Left;   CARPAL TUNNEL RELEASE Right 02/18/2019   Procedure: RIGHT CARPAL TUNNEL RELEASE;  Surgeon: Cindee Salt, MD;  Location: [facility];  Service: Orthopedics;  Laterality: Right;   cataract excision Right 2022   COLONOSCOPY W/ POLYPECTOMY     adenomatous poyp 04-2008   colonscopy     tics 10-2002   CYSTOSCOPY WITH RETROGRADE PYELOGRAM, URETEROSCOPY AND STENT PLACEMENT Left 09/01/2013   Procedure: CYSTOSCOPY WITH RETROGRADE PYELOGRAM, AND LEFT STENT PLACEMENT;  Surgeon: Milford Cage, MD;  Location: [facility];  Service: Urology;  Laterality: Left;   FINGER SURGERY Left 2004   2nd finger   G 1 P 1     interstitial cystitis     with clot in catheter   PARTIAL HYSTERECTOMY     with one ovary left   SHOULDER ARTHROSCOPY Left    TOTAL ABDOMINAL HYSTERECTOMY W/ BILATERAL SALPINGOOPHORECTOMY     dysfunctional menses   Social History   Socioeconomic History   Marital status: Married    Spouse name: Not on file  Number of children: 1   Years of education: BS   Highest education level: Not on file  Occupational History   Occupation: retired   Occupation: Runner, broadcasting/film/video    Comment: Kindergarten  Tobacco Use   Smoking status: Never   Smokeless tobacco: Never  Vaping Use   Vaping status: Never Used  Substance and Sexual Activity   Alcohol use: Yes    Comment: social   Drug use: No   Sexual activity: Not Currently  Other Topics Concern   Not on file  Social History Narrative   Lives at home with husband, is his caregiver   No daily use of caffeine.   Right-handed.         Social Determinants of Health   Financial Resource Strain: Low Risk  (03/17/2022)   Overall Financial Resource Strain (CARDIA)    Difficulty of Paying Living Expenses: Not hard at all  Food Insecurity: No Food Insecurity (03/17/2022)   Hunger Vital Sign     Worried About Running Out of Food in the Last Year: Never true    Ran Out of Food in the Last Year: Never true  Transportation Needs: No Transportation Needs (03/17/2022)   PRAPARE - Administrator, Civil Service (Medical): No    Lack of Transportation (Non-Medical): No  Physical Activity: Sufficiently Active (03/17/2022)   Exercise Vital Sign    Days of Exercise per Week: 7 days    Minutes of Exercise per Session: 30 min  Stress: No Stress Concern Present (03/17/2022)   [employer] of Occupational Health - Occupational Stress Questionnaire    Feeling of Stress : Not at all  Social Connections: Socially Integrated (03/17/2022)   Social Connection and Isolation Panel [NHANES]    Frequency of Communication with Friends and Family: More than three times a week    Frequency of Social Gatherings with Friends and Family: Once a week    Attends Religious Services: 1 to 4 times per year    Active Member of [employer] or Organizations: Yes    Attends Banker Meetings: 1 to 4 times per year    Marital Status: Married   Allergies  Allergen Reactions   Bactrim [Sulfamethoxazole-Trimethoprim] Other (See Comments)   Nitroglycerin Anaphylaxis    Hypotensive after NTG administration in ER during chest pain evaluation Pt states she almost died,  Pt states in a procedure done in 10/2016 she had no reaction to this medication, so not sure if it is an actual allergy.    Pentosan Polysulfate Other (See Comments)    Elevated liver count Elevated liver enzymes Elevated LFTs.......Marland Kitchen elmiron  Other reaction(s): Not available   Pentosan Polysulfate Sodium     Elevated LFTs.......Marland Kitchen elmiron    Promethazine Hcl Other (See Comments)    "jittery on the inside"   Sulfamethoxazole-Trimethoprim Nausea Only    Unknown reaction per pt, Pt thinks she may have felt sick to her stomach.    Losartan Other (See Comments)    Pt states caused insomnia, tingling sensation in legs, lightheaded,  stomach pain   Promethazine Hcl    Lisinopril Cough   Family History  Problem Relation Age of Onset   Coronary artery disease Mother    Hypertension Mother    Asthma Mother    Dementia Mother    Stroke Mother        mini cva   Diabetes Father    Cirrhosis Father        non alcoholic   Colon  cancer Sister 32   Allergic rhinitis Sister    Allergic rhinitis Sister    Food Allergy Sister    Renal cancer Paternal Aunt        x 2   Coronary artery disease Maternal Grandfather    Breast cancer Maternal Aunt    Diabetes Paternal Aunt    Diabetes Paternal Grandmother    Esophageal cancer Neg Hx    Stomach cancer Neg Hx      Current Outpatient Medications (Cardiovascular):    amLODipine (NORVASC) 5 MG tablet, Take 1 tablet (5 mg total) by mouth daily.   carvedilol (COREG) 3.125 MG tablet, TAKE 1 TABLET BY MOUTH TWICE A DAY   EPINEPHrine (EPIPEN 2-PAK) 0.3 mg/0.3 mL IJ SOAJ injection, Use as directed for severe allergic reactions   rosuvastatin (CRESTOR) 5 MG tablet, Take 1 tablet (5 mg total) by mouth daily.   valsartan (DIOVAN) 40 MG tablet, Take 1 tablet (40 mg total) by mouth daily.   Current Outpatient Medications (Analgesics):    acetaminophen (TYLENOL) 500 MG tablet, Take 1,000 mg by mouth 3 (three) times daily as needed.    aspirin 81 MG tablet, Take 81 mg by mouth daily.   ibuprofen (ADVIL) 800 MG tablet, Take 1 tablet (800 mg total) by mouth at bedtime as needed.  Current Outpatient Medications (Hematological):    vitamin B-12 (CYANOCOBALAMIN) 1000 MCG tablet, Take 1,000 mcg by mouth 2 (two) times daily.   Current Outpatient Medications (Other):    Ascorbic Acid (VITAMIN C) 1000 MG tablet, Take 1,000 mg by mouth 2 (two) times daily.    Cholecalciferol (VITAMIN D-3) 25 MCG (1000 UT) CAPS, Take 10,000 Units by mouth daily.    diclofenac Sodium (VOLTAREN) 1 % GEL, Apply topically 4 (four) times daily as needed.    dicyclomine (BENTYL) 10 MG capsule, Take 1 to 2 tablets  three times daily as needed   MAGNESIUM GLYCINATE PO, Take 240 mg by mouth daily.   meclizine (ANTIVERT) 25 MG tablet, Take 0.5 tablets (12.5 mg total) by mouth 2 (two) times daily as needed for dizziness.   mupirocin ointment (BACTROBAN) 2 %, Apply 1 Application topically 2 (two) times daily. Into both nostrils bid x 5 days   naloxone (NARCAN) 4 MG/0.1ML LIQD nasal spray kit, Narcan 4 mg/actuation nasal spray  PLACE 1 SPRAY INTO EACH NOSTRIL FOR SUSPECTED OPIOID OVERDOSE. MAY REPEAT 2 - 3 MINUTES WITH NEW BOTTLE IN OTHER NOSTRIL   Omega-3 Fatty Acids (FISH OIL BURP-LESS) 1000 MG CAPS, Takes 1000 mg BID   omeprazole (PRILOSEC) 40 MG capsule, Take 1 capsule (40 mg total) by mouth daily.   ondansetron (ZOFRAN) 4 MG tablet, Take 1 tablet (4 mg total) by mouth every 8 (eight) hours as needed.   vitamin E 400 UNIT capsule, Take 400 Units by mouth daily.   zinc gluconate 50 MG tablet, Take 50 mg by mouth daily.   Reviewed prior external information including notes and imaging from  primary care provider As well as notes that were available from care everywhere and other healthcare systems.  Past medical history, social, surgical and family history all reviewed in electronic medical record.  No pertanent information unless stated regarding to the chief complaint.   Review of Systems:  No headache, visual changes, nausea, vomiting, diarrhea, constipation, dizziness, abdominal pain, skin rash, fevers, chills, night sweats, weight loss, swollen lymph nodes, body aches, joint swelling, chest pain, shortness of breath, mood changes. POSITIVE muscle aches  Objective  There were no  vitals taken for this visit.   General: No apparent distress alert and oriented x3 mood and affect normal, dressed appropriately.  HEENT: Pupils equal, extraocular movements intact  Respiratory: Patient's speak in full sentences and does not appear short of breath  Cardiovascular: No lower extremity edema, non tender, no  erythema      Impression and Recommendations:

## 2023-02-13 DIAGNOSIS — M25512 Pain in left shoulder: Secondary | ICD-10-CM | POA: Diagnosis not present

## 2023-02-14 ENCOUNTER — Encounter: Payer: Self-pay | Admitting: Internal Medicine

## 2023-02-14 ENCOUNTER — Ambulatory Visit: Payer: Medicare Other | Admitting: Family Medicine

## 2023-02-27 ENCOUNTER — Ambulatory Visit: Payer: Self-pay | Admitting: General Surgery

## 2023-02-27 DIAGNOSIS — L723 Sebaceous cyst: Secondary | ICD-10-CM | POA: Diagnosis not present

## 2023-02-27 NOTE — H&P (View-Only) (Deleted)
 REFERRING PHYSICIAN:  Pincus Sanes, MD   PROVIDER:  Elenora Gamma, MD   MRN: [id number] DOB: 09/06/1945 DATE OF ENCOUNTER: 02/27/2023   Subjective    Chief Complaint: NEW PROBLEM       History of Present Illness: [name] is a 77 y.o. female who is seen today as an office consultation at the request of Dr. Lawerance Bach for evaluation of NEW PROBLEM .  77 year old female who has been having recurrent buttock infections for the past 3 years.  She states approximately every 6 months she develops significant pain and swelling requiring antibiotics and 1 time last year, I&D here in the office.  She is here today for surgical evaluation     Review of Systems: A complete review of systems was obtained from the patient.  I have reviewed this information and discussed as appropriate with the patient.  See HPI as well for other ROS.     Medical History: Past Medical History      Past Medical History:  Diagnosis Date   Arthritis     Hyperlipidemia     Hypertension     Sleep apnea           Problem List  There is no problem list on file for this patient.      Past Surgical History       Past Surgical History:  Procedure Laterality Date   CATARACT EXTRACTION       HYSTERECTOMY       REPAIR BLADDER EXSTROPHY            Allergies       Allergies  Allergen Reactions   Promethazine Other (See Comments)      "extremities feel like moving on inside"   "jittery on the inside"   Jittery in arms and legs   Lisinopril Cough and Other (See Comments)      cough        Medications Ordered Prior to Encounter        Current Outpatient Medications on File Prior to Visit  Medication Sig Dispense Refill   ascorbic acid, vitamin C, (VITAMIN C) 1000 MG tablet Take by oral route.       aspirin 81 MG EC tablet Take 1 tablet every day by oral route.       carvediloL (COREG) 3.125 MG tablet Take 3.125 mg by mouth 2 (two) times daily       CREON 36,000-114,000- 180,000 unit  DR capsule as directed       difluprednate (DUREZOL) 0.05 % ophthalmic emulsion         DOCOSAHEXAENOIC ACID ORAL Take by mouth       HYDROcodone-acetaminophen (NORCO) 5-325 mg tablet TAKE 1 TABLET BY MOUTH THREE TIMES A DAY AS NEEDED FOR 7 DAYS       meclizine (ANTIVERT) 25 mg tablet Take by mouth       rosuvastatin (CRESTOR) 10 MG tablet         triamcinolone 0.1 % cream APPLY ONE APPLICATION ON THE SKIN TWICE DAILY. TAPER USE AS ABLE       amLODIPine (NORVASC) 5 MG tablet Take 5 mg by mouth once daily       cholecalciferol (VITAMIN D3) 1000 unit capsule Take by mouth (Patient not taking: Reported on 02/27/2023)       omeprazole (PRILOSEC) 40 MG DR capsule  (Patient not taking: Reported on 02/27/2023)        No current facility-administered medications on file  prior to visit.        Family History       Family History  Problem Relation Age of Onset   High blood pressure (Hypertension) Mother     Diabetes Father     High blood pressure (Hypertension) Father     Colon cancer Sister          Tobacco Use History  Social History       Tobacco Use  Smoking Status Never  Smokeless Tobacco Never        Social History  Social History        Socioeconomic History   Marital status: Married  Tobacco Use   Smoking status: Never   Smokeless tobacco: Never  Substance and Sexual Activity   Alcohol use: Never   Drug use: Never    Social Determinants of Health        Financial Resource Strain: Low Risk  (03/17/2022)    Received from [facility]    Overall Financial Resource Strain (CARDIA)     Difficulty of Paying Living Expenses: Not hard at all  Food Insecurity: No Food Insecurity (03/17/2022)    Received from [facility]    Hunger Vital Sign     Worried About Running Out of Food in the Last Year: Never true     Ran Out of Food in the Last Year: Never true  Transportation Needs: No Transportation Needs (03/17/2022)    Received from [facility] - Transportation      Lack of Transportation (Medical): No     Lack of Transportation (Non-Medical): No  Physical Activity: Sufficiently Active (03/17/2022)    Received from [facility]    Exercise Vital Sign     Days of Exercise per Week: 7 days     Minutes of Exercise per Session: 30 min  Stress: No Stress Concern Present (03/17/2022)    Received from [facility] of Occupational Health - Occupational Stress Questionnaire     Feeling of Stress : Not at all  Social Connections: Socially Integrated (03/17/2022)    Received from [facility]    Social Connection and Isolation Panel [NHANES]     Frequency of Communication with Friends and Family: More than three times a week     Frequency of Social Gatherings with Friends and Family: Once a week     Attends Religious Services: 1 to 4 times per year     Active Member of [employer] or Organizations: Yes     Attends Banker Meetings: 1 to 4 times per year     Marital Status: Married        Objective:         Vitals:    02/27/23 1054  PainSc: 0-No pain  PainLoc: Buttocks      Exam Gen: NAD Abd: soft buttock: L gluteal lesion with possible mass palpated underneath.     Labs, Imaging and Diagnostic Testing: Previous office notes reviewed.   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  I have recommended excision.  I will also evaluate this deeper lesion in the operating room.  We have discussed this in detail.  Risk include bleeding, infection and recurrence.  All questions were answered.   Vanita Panda, MD Colon and Rectal Surgery [facility]

## 2023-02-27 NOTE — H&P (Signed)
REFERRING PHYSICIAN:  Pincus Sanes, MD   PROVIDER:  Elenora Gamma, MD   MRN: [id number] DOB: 09/06/1945 DATE OF ENCOUNTER: 02/27/2023   Subjective    Chief Complaint: NEW PROBLEM       History of Present Illness: [name] is a 77 y.o. female who is seen today as an office consultation at the request of Dr. Lawerance Bach for evaluation of NEW PROBLEM .  77 year old female who has been having recurrent buttock infections for the past 3 years.  She states approximately every 6 months she develops significant pain and swelling requiring antibiotics and 1 time last year, I&D here in the office.  She is here today for surgical evaluation     Review of Systems: A complete review of systems was obtained from the patient.  I have reviewed this information and discussed as appropriate with the patient.  See HPI as well for other ROS.     Medical History: Past Medical History      Past Medical History:  Diagnosis Date   Arthritis     Hyperlipidemia     Hypertension     Sleep apnea           Problem List  There is no problem list on file for this patient.      Past Surgical History       Past Surgical History:  Procedure Laterality Date   CATARACT EXTRACTION       HYSTERECTOMY       REPAIR BLADDER EXSTROPHY            Allergies       Allergies  Allergen Reactions   Promethazine Other (See Comments)      "extremities feel like moving on inside"   "jittery on the inside"   Jittery in arms and legs   Lisinopril Cough and Other (See Comments)      cough        Medications Ordered Prior to Encounter        Current Outpatient Medications on File Prior to Visit  Medication Sig Dispense Refill   ascorbic acid, vitamin C, (VITAMIN C) 1000 MG tablet Take by oral route.       aspirin 81 MG EC tablet Take 1 tablet every day by oral route.       carvediloL (COREG) 3.125 MG tablet Take 3.125 mg by mouth 2 (two) times daily       CREON 36,000-114,000- 180,000 unit  DR capsule as directed       difluprednate (DUREZOL) 0.05 % ophthalmic emulsion         DOCOSAHEXAENOIC ACID ORAL Take by mouth       HYDROcodone-acetaminophen (NORCO) 5-325 mg tablet TAKE 1 TABLET BY MOUTH THREE TIMES A DAY AS NEEDED FOR 7 DAYS       meclizine (ANTIVERT) 25 mg tablet Take by mouth       rosuvastatin (CRESTOR) 10 MG tablet         triamcinolone 0.1 % cream APPLY ONE APPLICATION ON THE SKIN TWICE DAILY. TAPER USE AS ABLE       amLODIPine (NORVASC) 5 MG tablet Take 5 mg by mouth once daily       cholecalciferol (VITAMIN D3) 1000 unit capsule Take by mouth (Patient not taking: Reported on 02/27/2023)       omeprazole (PRILOSEC) 40 MG DR capsule  (Patient not taking: Reported on 02/27/2023)        No current facility-administered medications on file  prior to visit.        Family History       Family History  Problem Relation Age of Onset   High blood pressure (Hypertension) Mother     Diabetes Father     High blood pressure (Hypertension) Father     Colon cancer Sister          Tobacco Use History  Social History       Tobacco Use  Smoking Status Never  Smokeless Tobacco Never        Social History  Social History        Socioeconomic History   Marital status: Married  Tobacco Use   Smoking status: Never   Smokeless tobacco: Never  Substance and Sexual Activity   Alcohol use: Never   Drug use: Never    Social Determinants of Health        Financial Resource Strain: Low Risk  (03/17/2022)    Received from [facility]    Overall Financial Resource Strain (CARDIA)     Difficulty of Paying Living Expenses: Not hard at all  Food Insecurity: No Food Insecurity (03/17/2022)    Received from [facility]    Hunger Vital Sign     Worried About Running Out of Food in the Last Year: Never true     Ran Out of Food in the Last Year: Never true  Transportation Needs: No Transportation Needs (03/17/2022)    Received from [facility] - Transportation      Lack of Transportation (Medical): No     Lack of Transportation (Non-Medical): No  Physical Activity: Sufficiently Active (03/17/2022)    Received from [facility]    Exercise Vital Sign     Days of Exercise per Week: 7 days     Minutes of Exercise per Session: 30 min  Stress: No Stress Concern Present (03/17/2022)    Received from [facility] of Occupational Health - Occupational Stress Questionnaire     Feeling of Stress : Not at all  Social Connections: Socially Integrated (03/17/2022)    Received from [facility]    Social Connection and Isolation Panel [NHANES]     Frequency of Communication with Friends and Family: More than three times a week     Frequency of Social Gatherings with Friends and Family: Once a week     Attends Religious Services: 1 to 4 times per year     Active Member of [employer] or Organizations: Yes     Attends Banker Meetings: 1 to 4 times per year     Marital Status: Married        Objective:         Vitals:    02/27/23 1054  PainSc: 0-No pain  PainLoc: Buttocks      Exam Gen: NAD Abd: soft buttock: L gluteal lesion with possible mass palpated underneath.     Labs, Imaging and Diagnostic Testing: Previous office notes reviewed.   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  I have recommended excision.  I will also evaluate this deeper lesion in the operating room.  We have discussed this in detail.  Risk include bleeding, infection and recurrence.  All questions were answered.   Vanita Panda, MD Colon and Rectal Surgery [facility]

## 2023-02-28 ENCOUNTER — Encounter (INDEPENDENT_AMBULATORY_CARE_PROVIDER_SITE_OTHER): Payer: Self-pay

## 2023-03-01 ENCOUNTER — Ambulatory Visit (INDEPENDENT_AMBULATORY_CARE_PROVIDER_SITE_OTHER): Payer: Medicare Other | Admitting: Podiatry

## 2023-03-01 ENCOUNTER — Encounter: Payer: Self-pay | Admitting: Podiatry

## 2023-03-01 ENCOUNTER — Encounter (HOSPITAL_BASED_OUTPATIENT_CLINIC_OR_DEPARTMENT_OTHER): Payer: Self-pay | Admitting: General Surgery

## 2023-03-01 DIAGNOSIS — M79674 Pain in right toe(s): Secondary | ICD-10-CM

## 2023-03-01 DIAGNOSIS — M79675 Pain in left toe(s): Secondary | ICD-10-CM

## 2023-03-01 DIAGNOSIS — B351 Tinea unguium: Secondary | ICD-10-CM

## 2023-03-01 DIAGNOSIS — L600 Ingrowing nail: Secondary | ICD-10-CM

## 2023-03-01 NOTE — Patient Instructions (Signed)

## 2023-03-02 ENCOUNTER — Encounter (HOSPITAL_BASED_OUTPATIENT_CLINIC_OR_DEPARTMENT_OTHER): Payer: Self-pay | Admitting: General Surgery

## 2023-03-02 NOTE — Progress Notes (Signed)
Subjective:   Patient ID: [name], female   DOB: 77 y.o.   MRN: [id number]   HPI Patient presents stating that his right big toenail has really been bothering her and it is deformed and hard for her to wear shoe gear with.  Other nails are thickened but not painful currently   ROS      Objective:  Physical Exam  Neurovascular status intact with patient found to have good digital perfusion with an incurvated medial border right hallux pressing into the bed     Assessment:  Ingrown toenail deformity right hallux medial border with pain     Plan:  H&P reviewed and I have recommended that this just go ahead and be corrected due to the chronic nature of pain she wants this done I allowed her to read consent form and signed today I infiltrated the right hallux 60 mg like Marcaine mixture sterile prep done using sterile instrumentation remove the medial border with painful spicule exposed matrix applied phenol 3 applications 30 seconds followed by alcohol lavage sterile dressing gave instructions on soaks wear dressing for 24 hours take off earlier if throbbing were to occur call with questions and courtesy debridement of all adjacent nails

## 2023-03-06 ENCOUNTER — Encounter (HOSPITAL_BASED_OUTPATIENT_CLINIC_OR_DEPARTMENT_OTHER): Payer: Self-pay | Admitting: General Surgery

## 2023-03-06 ENCOUNTER — Other Ambulatory Visit: Payer: Self-pay

## 2023-03-06 NOTE — Progress Notes (Signed)
Spoke w/ via phone for pre-op interview---pt Lab needs dos----  I stat             Lab results------EKG 09-18-2022 epic, echo 07-21-2022 epic, [facility] cardiology dayna Shea Evans 12-28-2022 epic, monitor report 11-11-2021 epic COVID test -----patient states asymptomatic no test needed Arrive at -------815 8-0-2024 NPO after MN NO Solid Food.  Clear liquids from MN until---715 am Med rec completed Medications to take morning of surgery -----amlodipine, carvedilol Diabetic medication -----n/a Patient instructed no nail polish to be worn day of surgery Patient instructed to bring photo id and insurance card day of surgery Patient aware to have Driver (ride ) / caregiver   daughter Purvis Kilts Moan  for 24 hours after surgery  Patient Special Instructions -----bring cpap mask tubing and machine and leave in car Pre-Op special Instructions -----pt received no instructions for 81 mg asa from surgeon office, pt to take last dose 81 mg asa on 03-08-2023 Patient verbalized understanding of instructions that were given at this phone interview. Patient denies shortness of breath, chest pain, fever, cough at this phone interview.

## 2023-03-06 NOTE — Progress Notes (Signed)
Subjective:    Patient ID: [name], female    DOB: [birth year], 77 y.o.   MRN: [id number]     HPI [name] is here for follow up of her chronic medical problems.  Buttock cyst - saw surgery - to have surgery in 2 days  Muscle cramping day and night - improved  Feeling lousy - low energy, brain fog -   Has tried to increase water intake, but states she can still do better.  OSA not treated - Was going to use cpap nightly - has been using more and has tracked her use.  She is still tired and sleepy.  Brain fog depends on what is going on that day, sleep, etc.  She sleeps later and stays up later.  May not always be getting enough sleep.  We stopped chlorthalidone/KCL - ? Improve cramping. Started valsartan -- BP at home fairly controlled.  Has had a couple of higher readings, but most are well-controlled  Has not noticed increased swelling in her legs since stopping the chlorthalidone.  Having some left lateral hip pain.  Has also noticed recently that she is not able to cross her left leg over her right knee.  1 day she did have a cramping in her left upper thigh which is different from her other cramping.  Medications and allergies reviewed with patient and updated if appropriate.  Current Outpatient Medications on File Prior to Visit  Medication Sig Dispense Refill   acetaminophen (TYLENOL) 500 MG tablet Take 1,000 mg by mouth 3 (three) times daily as needed.      amLODipine (NORVASC) 5 MG tablet Take 1 tablet (5 mg total) by mouth daily. 90 tablet 3   Ascorbic Acid (VITAMIN C) 1000 MG tablet Take 1,000 mg by mouth 2 (two) times daily.      aspirin 81 MG tablet Take 81 mg by mouth daily.     carvedilol (COREG) 3.125 MG tablet TAKE 1 TABLET BY MOUTH TWICE A DAY 60 tablet 11   Cholecalciferol (VITAMIN D-3) 25 MCG (1000 UT) CAPS Take 10,000 Units by mouth daily.      diclofenac Sodium (VOLTAREN) 1 % GEL Apply topically 4 (four) times daily as needed.      EPINEPHrine  (EPIPEN 2-PAK) 0.3 mg/0.3 mL IJ SOAJ injection Use as directed for severe allergic reactions 2 each 1   MAGNESIUM GLYCINATE PO Take 240 mg by mouth daily.     naloxone (NARCAN) 4 MG/0.1ML LIQD nasal spray kit Narcan 4 mg/actuation nasal spray  PLACE 1 SPRAY INTO EACH NOSTRIL FOR SUSPECTED OPIOID OVERDOSE. MAY REPEAT 2 - 3 MINUTES WITH NEW BOTTLE IN OTHER NOSTRIL     Omega-3 Fatty Acids (FISH OIL BURP-LESS) 1000 MG CAPS Takes 1280 mg 2 caps daiily 60 capsule 0   ondansetron (ZOFRAN) 4 MG tablet Take 1 tablet (4 mg total) by mouth every 8 (eight) hours as needed. 30 tablet 1   rosuvastatin (CRESTOR) 5 MG tablet Take 1 tablet (5 mg total) by mouth daily. (Patient taking differently: Take 5 mg by mouth every evening.) 30 tablet 11   valsartan (DIOVAN) 40 MG tablet Take 1 tablet (40 mg total) by mouth daily. 30 tablet 3   vitamin B-12 (CYANOCOBALAMIN) 1000 MCG tablet Take 1,000 mcg by mouth 2 (two) times daily.      vitamin E 400 UNIT capsule Take 400 Units by mouth daily.     zinc gluconate 50 MG tablet Take 50 mg by mouth  daily.     No current facility-administered medications on file prior to visit.     Review of Systems     Objective:   Vitals:   03/07/23 1532  BP: 120/76  Pulse: 80  Temp: 97.9 F (36.6 C)  SpO2: 97%   BP Readings from Last 3 Encounters:  03/07/23 120/76  02/07/23 120/72  12/28/22 122/70   Wt Readings from Last 3 Encounters:  03/07/23 159 lb (72.1 kg)  12/28/22 157 lb (71.2 kg)  12/20/22 157 lb (71.2 kg)   Body mass index is 28.62 kg/m.    Physical Exam Constitutional:      General: She is not in acute distress.    Appearance: Normal appearance. She is not ill-appearing.  Musculoskeletal:        General: Tenderness (With palpation of lateral aspect of left trochanter) present.     Right lower leg: Edema (Trace) present.     Left lower leg: Edema (Trace) present.  Skin:    General: Skin is warm and dry.  Neurological:     Mental Status: She is  alert.        Lab Results  Component Value Date   WBC 8.3 10/25/2022   HGB 12.9 10/25/2022   HCT 37.8 10/25/2022   PLT 275.0 10/25/2022   GLUCOSE 120 (H) 10/25/2022   CHOL 131 10/25/2022   TRIG 127.0 10/25/2022   HDL 61.40 10/25/2022   LDLDIRECT 114.0 05/02/2016   LDLCALC 44 10/25/2022   ALT 25 10/25/2022   AST 24 10/25/2022   NA 139 10/25/2022   K 4.3 10/25/2022   CL 101 10/25/2022   CREATININE 1.16 10/25/2022   BUN 19 10/25/2022   CO2 30 10/25/2022   TSH 4.150 01/18/2022   INR 0.9 01/18/2022   HGBA1C 6.7 (H) 10/25/2022   MICROALBUR 1.2 10/25/2022     Assessment & Plan:    See Problem List for Assessment and Plan of chronic medical problems.

## 2023-03-07 ENCOUNTER — Encounter: Payer: Self-pay | Admitting: Internal Medicine

## 2023-03-07 ENCOUNTER — Ambulatory Visit: Payer: Medicare Other | Admitting: Internal Medicine

## 2023-03-07 VITALS — BP 120/76 | HR 80 | Temp 97.9°F | Ht 62.5 in | Wt 159.0 lb

## 2023-03-07 DIAGNOSIS — I10 Essential (primary) hypertension: Secondary | ICD-10-CM

## 2023-03-07 DIAGNOSIS — L0231 Cutaneous abscess of buttock: Secondary | ICD-10-CM

## 2023-03-07 DIAGNOSIS — G4733 Obstructive sleep apnea (adult) (pediatric): Secondary | ICD-10-CM

## 2023-03-07 DIAGNOSIS — R252 Cramp and spasm: Secondary | ICD-10-CM

## 2023-03-07 DIAGNOSIS — R5383 Other fatigue: Secondary | ICD-10-CM | POA: Diagnosis not present

## 2023-03-07 MED ORDER — VITAMIN C 1000 MG PO TABS: 1000.0000 mg | ORAL_TABLET | Freq: Two times a day (BID) | ORAL | Status: DC

## 2023-03-07 MED ORDER — ACETAMINOPHEN 500 MG PO TABS: 1000.0000 mg | ORAL_TABLET | Freq: Three times a day (TID) | ORAL | Status: AC | PRN

## 2023-03-07 NOTE — Assessment & Plan Note (Signed)
Chronic Much improved after stopping chlorthalidone Continue to increase water intake

## 2023-03-07 NOTE — Assessment & Plan Note (Signed)
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

## 2023-03-07 NOTE — Patient Instructions (Addendum)
    Pulmonary - sleep apnea - Dr Craige Cotta  Work on increasing sleep duration.  Continue cpap nightly   Work on increasing water intake.       Return for follow up as scheduled.

## 2023-03-07 NOTE — Assessment & Plan Note (Signed)
Chronic Blood pressure well-controlled here and seems to be reasonably controlled at home Chlorthalidone/potassium stopped because of leg cramping Continue amlodipine 5 mg daily, Coreg 3.125 mg twice daily and valsartan 40 mg daily Continue to monitor BP at home

## 2023-03-07 NOTE — Assessment & Plan Note (Signed)
Has seen surgery To have surgery in 2 days

## 2023-03-07 NOTE — Assessment & Plan Note (Addendum)
Chronic Had not been using CPAP but recently has started using it on a fairly nightly basis Unfortunately this has not improved her fatigue or brain fog Encouraged her to continue to use this nightly Discussed possibly getting retested with Dr. Craige Cotta or Dr. Reginia Naas

## 2023-03-09 ENCOUNTER — Ambulatory Visit (HOSPITAL_BASED_OUTPATIENT_CLINIC_OR_DEPARTMENT_OTHER)
Admission: RE | Admit: 2023-03-09 | Discharge: 2023-03-09 | Disposition: A | Discharge status: Home / Self Care | Payer: Medicare Other | Source: Ambulatory Visit | Attending: General Surgery | Admitting: General Surgery

## 2023-03-09 ENCOUNTER — Ambulatory Visit (HOSPITAL_BASED_OUTPATIENT_CLINIC_OR_DEPARTMENT_OTHER): Payer: Medicare Other | Admitting: Anesthesiology

## 2023-03-09 ENCOUNTER — Encounter (HOSPITAL_BASED_OUTPATIENT_CLINIC_OR_DEPARTMENT_OTHER): Payer: Self-pay | Admitting: General Surgery

## 2023-03-09 ENCOUNTER — Encounter (HOSPITAL_BASED_OUTPATIENT_CLINIC_OR_DEPARTMENT_OTHER)
Admission: RE | Disposition: A | Discharge status: Home / Self Care | Payer: Self-pay | Source: Ambulatory Visit | Attending: General Surgery

## 2023-03-09 DIAGNOSIS — K219 Gastro-esophageal reflux disease without esophagitis: Secondary | ICD-10-CM | POA: Insufficient documentation

## 2023-03-09 DIAGNOSIS — L723 Sebaceous cyst: Secondary | ICD-10-CM | POA: Diagnosis not present

## 2023-03-09 DIAGNOSIS — I129 Hypertensive chronic kidney disease with stage 1 through stage 4 chronic kidney disease, or unspecified chronic kidney disease: Secondary | ICD-10-CM | POA: Diagnosis not present

## 2023-03-09 DIAGNOSIS — I2510 Atherosclerotic heart disease of native coronary artery without angina pectoris: Secondary | ICD-10-CM | POA: Insufficient documentation

## 2023-03-09 DIAGNOSIS — L720 Epidermal cyst: Secondary | ICD-10-CM | POA: Diagnosis not present

## 2023-03-09 DIAGNOSIS — Z01818 Encounter for other preprocedural examination: Secondary | ICD-10-CM

## 2023-03-09 DIAGNOSIS — M1990 Unspecified osteoarthritis, unspecified site: Secondary | ICD-10-CM | POA: Diagnosis not present

## 2023-03-09 DIAGNOSIS — G4730 Sleep apnea, unspecified: Secondary | ICD-10-CM | POA: Insufficient documentation

## 2023-03-09 DIAGNOSIS — N189 Chronic kidney disease, unspecified: Secondary | ICD-10-CM | POA: Insufficient documentation

## 2023-03-09 DIAGNOSIS — Z8249 Family history of ischemic heart disease and other diseases of the circulatory system: Secondary | ICD-10-CM | POA: Diagnosis not present

## 2023-03-09 DIAGNOSIS — E1122 Type 2 diabetes mellitus with diabetic chronic kidney disease: Secondary | ICD-10-CM | POA: Diagnosis not present

## 2023-03-09 HISTORY — DX: Personal history of urinary calculi: Z87.442

## 2023-03-09 HISTORY — DX: Pneumonia, unspecified organism: J18.9

## 2023-03-09 HISTORY — PX: EXCISION OF KELOID: SHX6267

## 2023-03-09 LAB — POCT I-STAT, CHEM 8
BUN: 20 mg/dL (ref 8–23)
Calcium, Ion: 1.24 mmol/L (ref 1.15–1.40)
Chloride: 107 mmol/L (ref 98–111)
Creatinine, Ser: 1 mg/dL (ref 0.44–1.00)
Glucose, Bld: 102 mg/dL — ABNORMAL HIGH (ref 70–99)
HCT: 36 % (ref 36.0–46.0)
Hemoglobin: 12.2 g/dL (ref 12.0–15.0)
Potassium: 4.2 mmol/L (ref 3.5–5.1)
Sodium: 142 mmol/L (ref 135–145)
TCO2: 24 mmol/L (ref 22–32)

## 2023-03-09 SURGERY — EXCISION, KELOID
Anesthesia: Monitor Anesthesia Care | Site: Buttocks

## 2023-03-09 MED ORDER — OXYCODONE HCL 5 MG PO TABS: 5.0000 mg | ORAL_TABLET | Freq: Once | ORAL | Status: DC | PRN

## 2023-03-09 MED ORDER — PROPOFOL 500 MG/50ML IV EMUL
INTRAVENOUS | Status: DC | PRN
  Administered 2023-03-09: 200 ug/kg/min via INTRAVENOUS

## 2023-03-09 MED ORDER — FENTANYL CITRATE (PF) 100 MCG/2ML IJ SOLN
INTRAMUSCULAR | Status: AC
  Filled 2023-03-09: qty 2

## 2023-03-09 MED ORDER — ACETAMINOPHEN 500 MG PO TABS
ORAL_TABLET | ORAL | Status: AC
  Filled 2023-03-09: qty 2

## 2023-03-09 MED ORDER — TRAMADOL HCL 50 MG PO TABS: 50.0000 mg | ORAL_TABLET | Freq: Four times a day (QID) | ORAL | 0 refills | Status: DC | PRN

## 2023-03-09 MED ORDER — CEFAZOLIN SODIUM-DEXTROSE 2-4 GM/100ML-% IV SOLN
INTRAVENOUS | Status: AC
  Filled 2023-03-09: qty 100

## 2023-03-09 MED ORDER — OXYCODONE HCL 5 MG/5ML PO SOLN: 5.0000 mg | Freq: Once | ORAL | Status: DC | PRN

## 2023-03-09 MED ORDER — LACTATED RINGERS IV SOLN: INTRAVENOUS | Status: DC

## 2023-03-09 MED ORDER — CEFAZOLIN SODIUM-DEXTROSE 2-4 GM/100ML-% IV SOLN
2.0000 g | INTRAVENOUS | Status: AC
  Administered 2023-03-09: 2 g via INTRAVENOUS

## 2023-03-09 MED ORDER — FENTANYL CITRATE (PF) 100 MCG/2ML IJ SOLN: 25.0000 ug | INTRAMUSCULAR | Status: DC | PRN

## 2023-03-09 MED ORDER — 0.9 % SODIUM CHLORIDE (POUR BTL) OPTIME
TOPICAL | Status: DC | PRN
  Administered 2023-03-09: 500 mL

## 2023-03-09 MED ORDER — PROPOFOL 500 MG/50ML IV EMUL
INTRAVENOUS | Status: AC
  Filled 2023-03-09: qty 50

## 2023-03-09 MED ORDER — BUPIVACAINE-EPINEPHRINE 0.25% -1:200000 IJ SOLN
INTRAMUSCULAR | Status: DC | PRN
Start: 2023-03-09 — End: 2023-03-09
  Administered 2023-03-09: 13 mL

## 2023-03-09 MED ORDER — ONDANSETRON HCL 4 MG/2ML IJ SOLN: 4.0000 mg | Freq: Once | INTRAMUSCULAR | Status: DC | PRN

## 2023-03-09 MED ORDER — SODIUM CHLORIDE 0.9% FLUSH: 3.0000 mL | Freq: Two times a day (BID) | INTRAVENOUS | Status: DC

## 2023-03-09 MED ORDER — ACETAMINOPHEN 500 MG PO TABS
1000.0000 mg | ORAL_TABLET | ORAL | Status: AC
  Administered 2023-03-09: 1000 mg via ORAL

## 2023-03-09 MED ORDER — FENTANYL CITRATE (PF) 100 MCG/2ML IJ SOLN
INTRAMUSCULAR | Status: DC | PRN
  Administered 2023-03-09: 25 ug via INTRAVENOUS

## 2023-03-09 MED ORDER — ACETAMINOPHEN 10 MG/ML IV SOLN: 1000.0000 mg | Freq: Once | INTRAVENOUS | Status: DC | PRN

## 2023-03-09 SURGICAL SUPPLY — 48 items
ADH SKN CLS APL DERMABOND .7 (GAUZE/BANDAGES/DRESSINGS)
APL PRP STRL LF DISP 70% ISPRP (MISCELLANEOUS) ×1
APL SKNCLS STERI-STRIP NONHPOA (GAUZE/BANDAGES/DRESSINGS)
BENZOIN TINCTURE PRP APPL 2/3 (GAUZE/BANDAGES/DRESSINGS) IMPLANT
BLADE CLIPPER SENSICLIP SURGIC (BLADE) IMPLANT
BLADE EXTENDED COATED 6.5IN (ELECTRODE) IMPLANT
BLADE SURG 10 STRL SS (BLADE) ×1 IMPLANT
CHLORAPREP W/TINT 26 (MISCELLANEOUS) ×1 IMPLANT
COVER BACK TABLE 60X90IN (DRAPES) ×1 IMPLANT
COVER MAYO STAND STRL (DRAPES) ×1 IMPLANT
DERMABOND ADVANCED .7 DNX12 (GAUZE/BANDAGES/DRESSINGS) IMPLANT
DRAPE LAPAROTOMY 100X72 PEDS (DRAPES) ×1 IMPLANT
DRAPE UTILITY XL STRL (DRAPES) ×1 IMPLANT
DRSG TEGADERM 4X4.75 (GAUZE/BANDAGES/DRESSINGS) IMPLANT
ELECT REM PT RETURN 9FT ADLT (ELECTROSURGICAL) ×1
ELECTRODE REM PT RTRN 9FT ADLT (ELECTROSURGICAL) ×1 IMPLANT
GAUZE 4X4 16PLY ~~LOC~~+RFID DBL (SPONGE) ×1 IMPLANT
GAUZE SPONGE 4X4 12PLY STRL (GAUZE/BANDAGES/DRESSINGS) ×1 IMPLANT
GLOVE BIO SURGEON STRL SZ 6.5 (GLOVE) ×2 IMPLANT
GLOVE BIOGEL PI IND STRL 7.0 (GLOVE) ×1 IMPLANT
GLOVE INDICATOR 6.5 STRL GRN (GLOVE) ×1 IMPLANT
KIT TURNOVER CYSTO (KITS) ×1 IMPLANT
NDL HYPO 22X1.5 SAFETY MO (MISCELLANEOUS) ×1 IMPLANT
NEEDLE HYPO 22X1.5 SAFETY MO (MISCELLANEOUS) ×1
NS IRRIG 500ML POUR BTL (IV SOLUTION) IMPLANT
PACK BASIN DAY SURGERY FS (CUSTOM PROCEDURE TRAY) ×1 IMPLANT
PAD ARMBOARD 7.5X6 YLW CONV (MISCELLANEOUS) IMPLANT
PENCIL SMOKE EVACUATOR (MISCELLANEOUS) ×1 IMPLANT
SLEEVE SCD COMPRESS KNEE MED (STOCKING) ×1 IMPLANT
SPIKE FLUID TRANSFER (MISCELLANEOUS) IMPLANT
STRIP CLOSURE SKIN 1/2X4 (GAUZE/BANDAGES/DRESSINGS) IMPLANT
SUT ETHILON 2 0 FS 18 (SUTURE) IMPLANT
SUT ETHILON 4 0 PS 2 18 (SUTURE) IMPLANT
SUT SILK 2 0 SH (SUTURE) IMPLANT
SUT VIC AB 2-0 SH 27 (SUTURE)
SUT VIC AB 2-0 SH 27XBRD (SUTURE) IMPLANT
SUT VIC AB 3-0 SH 18 (SUTURE) IMPLANT
SUT VIC AB 4-0 PS2 18 (SUTURE) IMPLANT
SUT VIC AB 4-0 SH 18 (SUTURE) IMPLANT
SWAB CULTURE ESWAB REG 1ML (MISCELLANEOUS) IMPLANT
SYR BULB IRRIG 60ML STRL (SYRINGE) ×1 IMPLANT
SYR CONTROL 10ML LL (SYRINGE) ×1 IMPLANT
TOWEL OR 17X24 6PK STRL BLUE (TOWEL DISPOSABLE) ×1 IMPLANT
TRAY DSU PREP LF (CUSTOM PROCEDURE TRAY) IMPLANT
TUBE CONNECTING 12X1/4 (SUCTIONS) ×1 IMPLANT
UNDERPAD 30X36 HEAVY ABSORB (UNDERPADS AND DIAPERS) IMPLANT
WATER STERILE IRR 500ML POUR (IV SOLUTION) ×1 IMPLANT
YANKAUER SUCT BULB TIP NO VENT (SUCTIONS) ×1 IMPLANT

## 2023-03-09 NOTE — Op Note (Signed)
03/09/2023  9:56 AM  PATIENT:  [name]  77 y.o. female  Patient Care Team: Pincus Sanes, MD as PCP - General (Internal Medicine) Meriam Sprague, MD as PCP - Cardiology (Cardiology) Pyrtle, Carie Caddy, MD as Consulting Physician (Gastroenterology) Judi Saa, DO as Consulting Physician (Family Medicine) Antony Contras, MD as Consulting Physician (Ophthalmology)  PRE-OPERATIVE DIAGNOSIS:  SEBACEOUS CYST  POST-OPERATIVE DIAGNOSIS:  INFECTED SEBACEOUS CYST  PROCEDURE:  EXCISIONAL BIOPSY BUTTOCK H GLUTEAL   Surgeon(s): Romie Levee, MD  ASSISTANT: none   ANESTHESIA:   local and MAC  EBL:  Total I/O In: 200 [I.V.:100; IV Piggyback:100] Out: -   DRAINS: none   SPECIMEN:  Source of Specimen:  infected sebaceous cyst  DISPOSITION OF SPECIMEN:  PATHOLOGY  COUNTS:  YES  PLAN OF CARE: Discharge to home after PACU  PATIENT DISPOSITION:  PACU - hemodynamically stable.  INDICATION: 77 y.o. F with recurrent skin infections   OR FINDINGS: infected sebaceous cyst  DESCRIPTION: the patient was identified in the preoperative holding area and taken to the OR where they were laid lateral on the operating room table.  MAC anesthesia was induced without difficulty. SCDs were also noted to be in place prior to the initiation of anesthesia.  The patient was then prepped and draped in the usual sterile fashion.   A surgical timeout was performed indicating the correct patient, procedure, positioning and need for preoperative antibiotics.   There was a necrotic area centralized inside an area of erythema.  I incised around the cystic lesion using electrocautery.  I remove the entire cyst. This measured 1.5 x1cm. The wound was left open and packed with gauze.  Hemostasis was good.  Patient was awakened from anesthesia and sent to the postanesthesia care unit in stable condition.  All counts were correct per operating room staff.  Vanita Panda, MD  Colorectal and General  Surgery [facility]

## 2023-03-09 NOTE — Discharge Instructions (Addendum)
GENERAL SURGERY: POST OP INSTRUCTIONS  DIET: Follow a light bland diet the first 24 hours after arrival home, such as soup, liquids, crackers, etc.  Be sure to include lots of fluids daily.  Avoid fast food or heavy meals as your are more likely to get nauseated.   Take your usually prescribed home medications unless otherwise directed. PAIN CONTROL: Pain is best controlled by a usual combination of three different methods TOGETHER: Ice/Heat Over the counter pain medication Prescription pain medication Most patients will experience some swelling and bruising around the incisions.  Ice packs or heating pads (30-60 minutes up to 6 times a day) will help. Use ice for the first few days to help decrease swelling and bruising, then switch to heat to help relax tight/sore spots and speed recovery.  Some people prefer to use ice alone, heat alone, alternating between ice & heat.  Experiment to what works for you.  Swelling and bruising can take several weeks to resolve.   It is helpful to take an over-the-counter pain medication regularly for the first few weeks.  Choose one of the following that works best for you: Naproxen (Aleve, etc)  Two 220mg  tabs twice a day Ibuprofen (Advil, etc) Three 200mg  tabs four times a day (every meal & bedtime) A  prescription for pain medication (such as Percocet, oxycodone, hydrocodone, etc) should be given to you upon discharge.  Take your pain medication as prescribed.  If you are having problems/concerns with the prescription medicine (does not control pain, nausea, vomiting, rash, itching, etc), please call us [phone] to see if we need to switch you to a different pain medicine that will work better for you and/or control your side effect better. If you need a refill on your pain medication, please contact your pharmacy.  They will contact our office to request authorization. Prescriptions will not be filled after 5 pm or on week-ends. Avoid getting constipated.   Between the surgery and the pain medications, it is common to experience some constipation.  Increasing fluid intake and taking a fiber supplement (such as Metamucil, Citrucel, FiberCon, MiraLax, etc) 1-2 times a day regularly will usually help prevent this problem from occurring.  A mild laxative (prune juice, Milk of Magnesia, MiraLax, etc) should be taken according to package directions if there are no bowel movements after 48 hours.   Wash / shower every day.  Pat dry and recover with dressing  ACTIVITIES as tolerated:   You may resume regular (light) daily activities beginning the next day--such as daily self-care, walking, climbing stairs--gradually increasing activities as tolerated.  If you can walk 30 minutes without difficulty, it is safe to try more intense activity such as jogging, treadmill, bicycling, low-impact aerobics, swimming, etc. Save the most intensive and strenuous activity for last such as sit-ups, heavy lifting, contact sports, etc  Refrain from any heavy lifting or straining until you are off narcotics for pain control.   DO NOT PUSH THROUGH PAIN.  Let pain be your guide: If it hurts to do something, don't do it.  Pain is your body warning you to avoid that activity for another week until the pain goes down. You may drive when you are no longer taking prescription pain medication, you can comfortably wear a seatbelt, and you can safely maneuver your car and apply brakes. You may have sexual intercourse when it is comfortable.  FOLLOW UP in our office Please call [facility] at [phone] to set up an appointment to see  your surgeon in the office for a follow-up appointment approximately 2-3 weeks after your surgery. Make sure that you call for this appointment the day you arrive home to insure a convenient appointment time. 9. IF YOU HAVE DISABILITY OR FAMILY LEAVE FORMS, BRING THEM TO THE OFFICE FOR PROCESSING.  DO NOT GIVE THEM TO YOUR DOCTOR.   WHEN TO CALL us [phone]  [phone]: Poor pain control Reactions / problems with new medications (rash/itching, nausea, etc)  Fever over 101.5 F (38.5 C) Worsening swelling or bruising Continued bleeding from incision. Increased pain, redness, or drainage from the incision   The clinic staff is available to answer your questions during regular business hours (8:30am-5pm).  Please don't hesitate to call and ask to speak to one of our nurses for clinical concerns.   If you have a medical emergency, go to the nearest emergency room or call 911.  A surgeon from [facility] is always on call at the [facility], [place] [address], [place], [place]  [address] ? MAIN: [phone] ? TOLL FREE: [phone] ?  FAX [phone] [email or web address]            No acetaminophen/Tylenol until after 2:15 pm today if needed.   Post Anesthesia Home Care Instructions  Activity: Get plenty of rest for the remainder of the day. A responsible individual must stay with you for 24 hours following the procedure.  For the next 24 hours, DO NOT: -Drive a car -Advertising copywriter -Drink alcoholic beverages -Take any medication unless instructed by your physician -Make any legal decisions or sign important papers.  Meals: Start with liquid foods such as gelatin or soup. Progress to regular foods as tolerated. Avoid greasy, spicy, heavy foods. If nausea and/or vomiting occur, drink only clear liquids until the nausea and/or vomiting subsides. Call your physician if vomiting continues.  Special Instructions/Symptoms: Your throat may feel dry or sore from the anesthesia or the breathing tube placed in your throat during surgery. If this causes discomfort, gargle with warm salt water. The discomfort should disappear within 24 hours.

## 2023-03-09 NOTE — Interval H&P Note (Deleted)
History and Physical Interval Note:  03/09/2023 8:18 AM  [name]  has presented today for surgery, with the diagnosis of SEBACEOUS CYST.  The various methods of treatment have been discussed with the patient and family. After consideration of risks, benefits and other options for treatment, the patient has consented to  Procedure(s): EXCISIONIONAL BIOPSY BUTTOCK H GLUTEAL (N/A) as a surgical intervention.  The patient's history has been reviewed, patient examined, no change in status, stable for surgery.  I have reviewed the patient's chart and labs.  Questions were answered to the patient's satisfaction.     Vanita Panda, MD  Colorectal and General Surgery [facility]

## 2023-03-09 NOTE — Transfer of Care (Signed)
Immediate Anesthesia Transfer of Care Note  Patient: [name]  Procedure(s) Performed: Procedure(s) (LRB): EXCISIONIONAL BIOPSY SEBACEOUS CYST LEFT BUTTOCK (N/A)  Patient Location: PACU  Anesthesia Type: MAC  Level of Consciousness: awake, alert , oriented and patient cooperative  Airway & Oxygen Therapy: Patient Spontanous Breathing and Patient connected to face mask oxygen  Post-op Assessment: Report given to PACU RN and Post -op Vital signs reviewed and stable  Post vital signs: Reviewed and stable  Complications: No apparent anesthesia complications  Last Vitals:  Vitals Value Taken Time  BP 105/60 03/09/23 1004  Temp    Pulse 61 03/09/23 1006  Resp 16 03/09/23 1006  SpO2 100 % 03/09/23 1006  Vitals shown include unfiled device data.  Last Pain:  Vitals:   03/09/23 0810  TempSrc: Oral  PainSc: 4       Patients Stated Pain Goal: 4 (03/09/23 0810)  Complications: No notable events documented.

## 2023-03-09 NOTE — Anesthesia Preprocedure Evaluation (Addendum)
Anesthesia Evaluation  Patient identified by MRN, date of birth, ID band Patient awake    Reviewed: Allergy & Precautions, NPO status , Patient's Chart, lab work & pertinent test results, reviewed documented beta blocker date and time   History of Anesthesia Complications Negative for: history of anesthetic complications  Airway Mallampati: II  TM Distance: >3 FB     Dental no notable dental hx.    Pulmonary sleep apnea and Continuous Positive Airway Pressure Ventilation , pneumonia, Not current smoker   breath sounds clear to auscultation       Cardiovascular hypertension, + CAD  (-) Past MI, (-) Cardiac Stents and (-) CABG + Valvular Problems/Murmurs AI  Rhythm:Regular Rate:Normal     Neuro/Psych neg Headaches, neg Seizures PSYCHIATRIC DISORDERS Anxiety Depression       GI/Hepatic ,GERD  ,,  Endo/Other  diabetesHypothyroidism    Renal/GU CRFRenal disease     Musculoskeletal  (+) Arthritis ,    Abdominal   Peds  Hematology   Anesthesia Other Findings   Reproductive/Obstetrics                             Anesthesia Physical Anesthesia Plan  ASA: 2  Anesthesia Plan: MAC   Post-op Pain Management:    Induction: Intravenous  PONV Risk Score and Plan: 1 and Ondansetron and Propofol infusion  Airway Management Planned:   Additional Equipment:   Intra-op Plan:   Post-operative Plan:   Informed Consent: I have reviewed the patients History and Physical, chart, labs and discussed the procedure including the risks, benefits and alternatives for the proposed anesthesia with the patient or authorized representative who has indicated his/her understanding and acceptance.     Dental advisory given  Plan Discussed with: CRNA  Anesthesia Plan Comments:         Anesthesia Quick Evaluation

## 2023-03-09 NOTE — H&P (Signed)
REFERRING PHYSICIAN:  Pincus Sanes, MD   PROVIDER:  Elenora Gamma, MD   MRN: [id number] DOB: [birth year]    Subjective    Chief Complaint: NEW PROBLEM       History of Present Illness: [name] is a 77 y.o. female who is seen today as an office consultation at the request of Dr. Lawerance Bach for evaluation of NEW PROBLEM .  77 year old female who has been having recurrent buttock infections for the past 3 years.  She states approximately every 6 months she develops significant pain and swelling requiring antibiotics and 1 time last year, I&D in the office.  She is here today for surgery     Review of Systems: A complete review of systems was obtained from the patient.  I have reviewed this information and discussed as appropriate with the patient.  See HPI as well for other ROS.     Medical History: Past Medical History         Past Medical History:  Diagnosis Date   Arthritis     Hyperlipidemia     Hypertension     Sleep apnea            Problem List  There is no problem list on file for this patient.      Past Surgical History           Past Surgical History:  Procedure Laterality Date   CATARACT EXTRACTION       HYSTERECTOMY       REPAIR BLADDER EXSTROPHY            Allergies           Allergies  Allergen Reactions   Promethazine Other (See Comments)      "extremities feel like moving on inside"   "jittery on the inside"   Jittery in arms and legs   Lisinopril Cough and Other (See Comments)      cough        Medications Ordered Prior to Encounter             Current Outpatient Medications on File Prior to Visit  Medication Sig Dispense Refill   ascorbic acid, vitamin C, (VITAMIN C) 1000 MG tablet Take by oral route.       aspirin 81 MG EC tablet Take 1 tablet every day by oral route.       carvediloL (COREG) 3.125 MG tablet Take 3.125 mg by mouth 2 (two) times daily       CREON 36,000-114,000- 180,000 unit DR capsule as directed        difluprednate (DUREZOL) 0.05 % ophthalmic emulsion         DOCOSAHEXAENOIC ACID ORAL Take by mouth       HYDROcodone-acetaminophen (NORCO) 5-325 mg tablet TAKE 1 TABLET BY MOUTH THREE TIMES A DAY AS NEEDED FOR 7 DAYS       meclizine (ANTIVERT) 25 mg tablet Take by mouth       rosuvastatin (CRESTOR) 10 MG tablet         triamcinolone 0.1 % cream APPLY ONE APPLICATION ON THE SKIN TWICE DAILY. TAPER USE AS ABLE       amLODIPine (NORVASC) 5 MG tablet Take 5 mg by mouth once daily       cholecalciferol (VITAMIN D3) 1000 unit capsule Take by mouth (Patient not taking: Reported on 02/27/2023)       omeprazole (PRILOSEC) 40 MG DR capsule  (Patient not taking: Reported on 02/27/2023)  No current facility-administered medications on file prior to visit.        Family History           Family History  Problem Relation Age of Onset   High blood pressure (Hypertension) Mother     Diabetes Father     High blood pressure (Hypertension) Father     Colon cancer Sister          Tobacco Use History  Social History         Tobacco Use  Smoking Status Never  Smokeless Tobacco Never        Social History  Social History           Socioeconomic History   Marital status: Married  Tobacco Use   Smoking status: Never   Smokeless tobacco: Never  Substance and Sexual Activity   Alcohol use: Never   Drug use: Never    Social Determinants of Health           Financial Resource Strain: Low Risk  (03/17/2022)    Received from [facility]    Overall Financial Resource Strain (CARDIA)     Difficulty of Paying Living Expenses: Not hard at all  Food Insecurity: No Food Insecurity (03/17/2022)    Received from [facility]    Hunger Vital Sign     Worried About Running Out of Food in the Last Year: Never true     Ran Out of Food in the Last Year: Never true  Transportation Needs: No Transportation Needs (03/17/2022)    Received from [facility] - Transportation     Lack of  Transportation (Medical): No     Lack of Transportation (Non-Medical): No  Physical Activity: Sufficiently Active (03/17/2022)    Received from [facility]    Exercise Vital Sign     Days of Exercise per Week: 7 days     Minutes of Exercise per Session: 30 min  Stress: No Stress Concern Present (03/17/2022)    Received from [facility] - Occupational Stress Questionnaire     Feeling of Stress : Not at all  Social Connections: Socially Integrated (03/17/2022)    Received from [facility]    Social Connection and Isolation Panel [NHANES]     Frequency of Communication with Friends and Family: More than three times a week     Frequency of Social Gatherings with Friends and Family: Once a week     Attends Religious Services: 1 to 4 times per year     Active Member of [employer] or Organizations: Yes     Attends Banker Meetings: 1 to 4 times per year     Marital Status: Married        Objective:      Vitals:   03/09/23 0810  BP: (!) 142/88  Pulse: 67  Resp: 16  Temp: 97.7 F (36.5 C)  SpO2: 97%    Exam Gen: NAD Abd: soft buttock: L gluteal lesion with possible mass palpated underneath.     Labs, Imaging and Diagnostic Testing: Previous office notes reviewed.   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  I have recommended excision. Given that it is inflamed today, I have recommended leaving the wound open as the risk of infection would be high if  we closed this.  I also gave her the option to reschedule when the cyst was not inflamed.  She elected to proceed with the surgery. We have discussed this in detail.  Risk include bleeding, infection and recurrence.  All questions were answered.   Vanita Panda, MD Colon and Rectal Surgery [facility]

## 2023-03-09 NOTE — Anesthesia Procedure Notes (Signed)
Procedure Name: MAC Date/Time: 03/09/2023 9:45 AM  Performed by: Francie Massing, CRNAPre-anesthesia Checklist: Patient identified, Emergency Drugs available, Suction available, Patient being monitored and Timeout performed Oxygen Delivery Method: Simple face mask

## 2023-03-09 NOTE — Anesthesia Postprocedure Evaluation (Signed)
Anesthesia Post Note  Patient: [name]  Procedure(s) Performed: Janace Hoard BIOPSY SEBACEOUS CYST LEFT BUTTOCK (Buttocks)     Patient location during evaluation: PACU Anesthesia Type: MAC Level of consciousness: awake and alert Pain management: pain level controlled Vital Signs Assessment: post-procedure vital signs reviewed and stable Respiratory status: spontaneous breathing, nonlabored ventilation, respiratory function stable and patient connected to nasal cannula oxygen Cardiovascular status: stable and blood pressure returned to baseline Postop Assessment: no apparent nausea or vomiting Anesthetic complications: no   No notable events documented.  Last Vitals:  Vitals:   03/09/23 1015 03/09/23 1030  BP: (!) 111/59 110/60  Pulse: (!) 59 (!) 58  Resp: 13 12  Temp:    SpO2: 96% 93%    Last Pain:  Vitals:   03/09/23 1030  TempSrc:   PainSc: 0-No pain                 Mariann Barter

## 2023-03-12 ENCOUNTER — Encounter (HOSPITAL_BASED_OUTPATIENT_CLINIC_OR_DEPARTMENT_OTHER): Payer: Self-pay | Admitting: General Surgery

## 2023-03-12 ENCOUNTER — Other Ambulatory Visit: Payer: Self-pay

## 2023-03-14 DIAGNOSIS — H26492 Other secondary cataract, left eye: Secondary | ICD-10-CM | POA: Diagnosis not present

## 2023-03-14 DIAGNOSIS — Z961 Presence of intraocular lens: Secondary | ICD-10-CM | POA: Diagnosis not present

## 2023-03-14 DIAGNOSIS — H524 Presbyopia: Secondary | ICD-10-CM | POA: Diagnosis not present

## 2023-03-14 DIAGNOSIS — H04123 Dry eye syndrome of bilateral lacrimal glands: Secondary | ICD-10-CM | POA: Diagnosis not present

## 2023-03-14 LAB — HM DIABETES EYE EXAM

## 2023-03-15 ENCOUNTER — Encounter (INDEPENDENT_AMBULATORY_CARE_PROVIDER_SITE_OTHER): Payer: Self-pay

## 2023-03-22 ENCOUNTER — Other Ambulatory Visit: Payer: Self-pay | Admitting: Physician Assistant

## 2023-03-26 ENCOUNTER — Encounter: Payer: Self-pay | Admitting: Family Medicine

## 2023-03-26 ENCOUNTER — Ambulatory Visit (INDEPENDENT_AMBULATORY_CARE_PROVIDER_SITE_OTHER): Payer: Medicare Other | Admitting: Family Medicine

## 2023-03-26 VITALS — BP 106/60 | HR 62 | Temp 98.0°F | Resp 20 | Ht 62.5 in | Wt 162.0 lb

## 2023-03-26 DIAGNOSIS — M47896 Other spondylosis, lumbar region: Secondary | ICD-10-CM | POA: Diagnosis not present

## 2023-03-26 DIAGNOSIS — Z91018 Allergy to other foods: Secondary | ICD-10-CM

## 2023-03-26 DIAGNOSIS — J069 Acute upper respiratory infection, unspecified: Secondary | ICD-10-CM

## 2023-03-26 DIAGNOSIS — M791 Myalgia, unspecified site: Secondary | ICD-10-CM | POA: Diagnosis not present

## 2023-03-26 DIAGNOSIS — M25551 Pain in right hip: Secondary | ICD-10-CM | POA: Diagnosis not present

## 2023-03-26 DIAGNOSIS — M25552 Pain in left hip: Secondary | ICD-10-CM | POA: Diagnosis not present

## 2023-03-26 DIAGNOSIS — M461 Sacroiliitis, not elsewhere classified: Secondary | ICD-10-CM | POA: Diagnosis not present

## 2023-03-26 DIAGNOSIS — M47816 Spondylosis without myelopathy or radiculopathy, lumbar region: Secondary | ICD-10-CM | POA: Diagnosis not present

## 2023-03-26 DIAGNOSIS — G894 Chronic pain syndrome: Secondary | ICD-10-CM | POA: Diagnosis not present

## 2023-03-26 DIAGNOSIS — M810 Age-related osteoporosis without current pathological fracture: Secondary | ICD-10-CM | POA: Diagnosis not present

## 2023-03-26 DIAGNOSIS — M160 Bilateral primary osteoarthritis of hip: Secondary | ICD-10-CM | POA: Diagnosis not present

## 2023-03-26 DIAGNOSIS — M48061 Spinal stenosis, lumbar region without neurogenic claudication: Secondary | ICD-10-CM | POA: Diagnosis not present

## 2023-03-26 DIAGNOSIS — M545 Low back pain, unspecified: Secondary | ICD-10-CM | POA: Diagnosis not present

## 2023-03-26 DIAGNOSIS — M4316 Spondylolisthesis, lumbar region: Secondary | ICD-10-CM | POA: Diagnosis not present

## 2023-03-26 MED ORDER — EPINEPHRINE 0.3 MG/0.3ML IJ SOAJ
INTRAMUSCULAR | 1 refills | Status: DC
Start: 2023-03-26 — End: 2024-05-12

## 2023-03-26 NOTE — Progress Notes (Signed)
Assessment & Plan:  1. Viral URI Education provided on viral URIs.  Continue symptom management.  Discussed she should continue to feel better, but that if she worsens she needs to let us know.  2. Food allergy Patient in need of a refill on her EpiPen as hers has expired.  She has not needed to use it. - EPINEPHrine (EPIPEN 2-PAK) 0.3 mg/0.3 mL IJ SOAJ injection; Use as directed for severe allergic reactions  Dispense: 2 each; Refill: 1   No results found for any visits on 03/26/23.  Follow up plan: Return if symptoms worsen or fail to improve.  Deliah Boston, MSN, APRN, FNP-C  Subjective:  HPI: [name] is a 77 y.o. female presenting on 03/26/2023 for Sinusitis (Better now: s/s Started last week - chills, ?fever, clear nasal drainage, fatigue, body aches, ST, cough. )  Patient complains of cough, sore throat, fever, body aches, clear nasal drainage, and fatigue. She denies shortness of breath and wheezing. Onset of symptoms was 1 week ago, gradually improving since that time.  Her only now remaining is the cough.  She is drinking plenty of fluids. Evaluation to date: none. Treatment to date:  Tylenol . She does not smoke.    ROS: Negative unless specifically indicated above in HPI.   Relevant past medical history reviewed and updated as indicated.   Allergies and medications reviewed and updated.   Current Outpatient Medications:    acetaminophen (TYLENOL) 500 MG tablet, Take 2 tablets (1,000 mg total) by mouth 3 (three) times daily as needed., Disp: , Rfl:    amLODipine (NORVASC) 5 MG tablet, Take 1 tablet (5 mg total) by mouth daily., Disp: 90 tablet, Rfl: 3   Ascorbic Acid (VITAMIN C) 1000 MG tablet, Take 1 tablet (1,000 mg total) by mouth 2 (two) times daily., Disp: , Rfl:    aspirin 81 MG tablet, Take 81 mg by mouth daily., Disp: , Rfl:    carvedilol (COREG) 3.125 MG tablet, TAKE 1 TABLET BY MOUTH TWICE A DAY, Disp: 180 tablet, Rfl: 3   Cholecalciferol (VITAMIN D-3)  25 MCG (1000 UT) CAPS, Take 10,000 Units by mouth daily. , Disp: , Rfl:    diclofenac Sodium (VOLTAREN) 1 % GEL, Apply topically 4 (four) times daily as needed. , Disp: , Rfl:    MAGNESIUM GLYCINATE PO, Take 240 mg by mouth daily., Disp: , Rfl:    Omega-3 Fatty Acids (FISH OIL BURP-LESS) 1000 MG CAPS, Takes 1280 mg 2 caps daiily, Disp: 60 capsule, Rfl: 0   rosuvastatin (CRESTOR) 5 MG tablet, Take 1 tablet (5 mg total) by mouth daily. (Patient taking differently: Take 5 mg by mouth every evening.), Disp: 30 tablet, Rfl: 11   traMADol (ULTRAM) 50 MG tablet, Take 1 tablet (50 mg total) by mouth every 6 (six) hours as needed., Disp: 15 tablet, Rfl: 0   valsartan (DIOVAN) 40 MG tablet, Take 1 tablet (40 mg total) by mouth daily., Disp: 30 tablet, Rfl: 3   vitamin B-12 (CYANOCOBALAMIN) 1000 MCG tablet, Take 1,000 mcg by mouth 2 (two) times daily. , Disp: , Rfl:    vitamin E 400 UNIT capsule, Take 400 Units by mouth daily., Disp: , Rfl:    zinc gluconate 50 MG tablet, Take 50 mg by mouth daily., Disp: , Rfl:    EPINEPHrine (EPIPEN 2-PAK) 0.3 mg/0.3 mL IJ SOAJ injection, Use as directed for severe allergic reactions (Patient not taking: Reported on 03/26/2023), Disp: 2 each, Rfl: 1   naloxone (NARCAN)  4 MG/0.1ML LIQD nasal spray kit, Narcan 4 mg/actuation nasal spray  PLACE 1 SPRAY INTO EACH NOSTRIL FOR SUSPECTED OPIOID OVERDOSE. MAY REPEAT 2 - 3 MINUTES WITH NEW BOTTLE IN OTHER NOSTRIL (Patient not taking: Reported on 03/26/2023), Disp: , Rfl:    ondansetron (ZOFRAN) 4 MG tablet, Take 1 tablet (4 mg total) by mouth every 8 (eight) hours as needed. (Patient not taking: Reported on 03/26/2023), Disp: 30 tablet, Rfl: 1  Allergies  Allergen Reactions   Bactrim [Sulfamethoxazole-Trimethoprim] Other (See Comments)   Nitroglycerin Anaphylaxis    Hypotensive after NTG administration in ER during chest pain evaluation Pt states she almost died,  Pt states in a procedure done in 10/2016 she had no reaction to this  medication, so not sure if it is an actual allergy.    Pentosan Polysulfate Other (See Comments)    Elevated liver count Elevated liver enzymes Elevated LFTs.......Marland Kitchen elmiron  Other reaction(s): Not available elmiron   Pentosan Polysulfate Sodium     Elevated LFTs.......Marland Kitchen elmiron    Promethazine Hcl Other (See Comments)    "jittery on the inside"   Sulfamethoxazole-Trimethoprim Nausea Only    Unknown reaction per pt, Pt thinks she may have felt sick to her stomach.    Losartan Other (See Comments)    Pt states caused insomnia, tingling sensation in legs, lightheaded, stomach pain   Promethazine Hcl    Lisinopril Cough    Objective:   BP 106/60   Pulse 62   Temp 98 F (36.7 C)   Resp 20   Ht 5' 2.5" (1.588 m)   Wt 162 lb (73.5 kg)   BMI 29.16 kg/m    Physical Exam Vitals reviewed.  Constitutional:      General: She is not in acute distress.    Appearance: Normal appearance. She is not ill-appearing, toxic-appearing or diaphoretic.  HENT:     Head: Normocephalic and atraumatic.     Right Ear: Tympanic membrane, ear canal and external ear normal. There is no impacted cerumen.     Left Ear: Tympanic membrane, ear canal and external ear normal. There is no impacted cerumen.     Nose: Congestion present. No rhinorrhea.     Right Sinus: No maxillary sinus tenderness or frontal sinus tenderness.     Left Sinus: No maxillary sinus tenderness or frontal sinus tenderness.     Mouth/Throat:     Mouth: Mucous membranes are moist.     Pharynx: Oropharynx is clear. No oropharyngeal exudate or posterior oropharyngeal erythema.  Eyes:     General: No scleral icterus.       Right eye: No discharge.        Left eye: No discharge.     Conjunctiva/sclera: Conjunctivae normal.  Cardiovascular:     Rate and Rhythm: Normal rate and regular rhythm.     Heart sounds: Normal heart sounds. No murmur heard.    No friction rub. No gallop.  Pulmonary:     Effort: Pulmonary effort is normal. No  respiratory distress.     Breath sounds: Normal breath sounds. No stridor. No wheezing, rhonchi or rales.  Musculoskeletal:        General: Normal range of motion.     Cervical back: Normal range of motion.  Lymphadenopathy:     Cervical: No cervical adenopathy.  Skin:    General: Skin is warm and dry.     Capillary Refill: Capillary refill takes less than 2 seconds.  Neurological:     General: No  focal deficit present.     Mental Status: She is alert and oriented to person, place, and time. Mental status is at baseline.  Psychiatric:        Mood and Affect: Mood normal.        Behavior: Behavior normal.        Thought Content: Thought content normal.        Judgment: Judgment normal.

## 2023-03-28 ENCOUNTER — Ambulatory Visit: Payer: Medicare Other

## 2023-04-18 DIAGNOSIS — Z91199 Patient's noncompliance with other medical treatment and regimen due to unspecified reason: Secondary | ICD-10-CM | POA: Diagnosis not present

## 2023-04-18 DIAGNOSIS — Z1330 Encounter for screening examination for mental health and behavioral disorders, unspecified: Secondary | ICD-10-CM | POA: Diagnosis not present

## 2023-04-18 DIAGNOSIS — G4733 Obstructive sleep apnea (adult) (pediatric): Secondary | ICD-10-CM | POA: Diagnosis not present

## 2023-04-26 ENCOUNTER — Encounter: Payer: Self-pay | Admitting: Internal Medicine

## 2023-04-26 NOTE — Patient Instructions (Addendum)
      Blood work was ordered.   The lab is on the first floor.    Medications changes include :  none      Return in about 6 months (around 10/25/2023) for follow up.

## 2023-04-26 NOTE — Progress Notes (Signed)
Subjective:    Patient ID: [name], female    DOB: [birth year], 77 y.o.   MRN: [id number]     HPI Leiani is here for follow up of her chronic medical problems.  Leg cramps - back on magnesium.    Pain medial proximal left leg - can not cross left leg  L shoulder pain - needs replacement  Tingling in legs - lower legs - redness, swelling.  She does wear compression socks but not every day.    Medications and allergies reviewed with patient and updated if appropriate.  Current Outpatient Medications on File Prior to Visit  Medication Sig Dispense Refill   acetaminophen (TYLENOL) 500 MG tablet Take 2 tablets (1,000 mg total) by mouth 3 (three) times daily as needed.     amLODipine (NORVASC) 5 MG tablet Take 1 tablet (5 mg total) by mouth daily. 90 tablet 3   Ascorbic Acid (VITAMIN C) 1000 MG tablet Take 1 tablet (1,000 mg total) by mouth 2 (two) times daily.     aspirin 81 MG tablet Take 81 mg by mouth daily.     carvedilol (COREG) 3.125 MG tablet TAKE 1 TABLET BY MOUTH TWICE A DAY 180 tablet 3   Cholecalciferol (VITAMIN D-3) 25 MCG (1000 UT) CAPS Take 10,000 Units by mouth daily.      diclofenac Sodium (VOLTAREN) 1 % GEL Apply topically 4 (four) times daily as needed.      EPINEPHrine (EPIPEN 2-PAK) 0.3 mg/0.3 mL IJ SOAJ injection Use as directed for severe allergic reactions 2 each 1   MAGNESIUM GLYCINATE PO Take 240 mg by mouth daily.     naloxone (NARCAN) 4 MG/0.1ML LIQD nasal spray kit      Omega-3 Fatty Acids (FISH OIL BURP-LESS) 1000 MG CAPS Takes 1280 mg 2 caps daiily 60 capsule 0   rosuvastatin (CRESTOR) 5 MG tablet Take 1 tablet (5 mg total) by mouth daily. (Patient taking differently: Take 5 mg by mouth every evening.) 30 tablet 11   traMADol (ULTRAM) 50 MG tablet Take 1 tablet (50 mg total) by mouth every 6 (six) hours as needed. 15 tablet 0   valsartan (DIOVAN) 40 MG tablet Take 1 tablet (40 mg total) by mouth daily. 30 tablet 3   vitamin B-12  (CYANOCOBALAMIN) 1000 MCG tablet Take 1,000 mcg by mouth 2 (two) times daily.      vitamin E 400 UNIT capsule Take 400 Units by mouth daily.     zinc gluconate 50 MG tablet Take 50 mg by mouth daily.     No current facility-administered medications on file prior to visit.     Review of Systems  Constitutional:  Negative for fever.  Respiratory:  Positive for cough (dry cough - improving - from covid). Negative for shortness of breath and wheezing.   Cardiovascular:  Positive for chest pain (left sided - chronic - not cardiac), palpitations and leg swelling (mild at times).  Musculoskeletal:  Positive for arthralgias.  Neurological:  Positive for light-headedness (occ). Negative for headaches.       Objective:   Vitals:   04/27/23 0755  BP: 120/76  Pulse: 68  Temp: 98.1 F (36.7 C)  SpO2: 98%   BP Readings from Last 3 Encounters:  04/27/23 120/76  03/26/23 106/60  03/09/23 123/63   Wt Readings from Last 3 Encounters:  04/27/23 160 lb (72.6 kg)  03/26/23 162 lb (73.5 kg)  03/09/23 163 lb 1.6 oz (74 kg)   Body  mass index is 28.8 kg/m.    Physical Exam Constitutional:      General: She is not in acute distress.    Appearance: Normal appearance.  HENT:     Head: Normocephalic and atraumatic.  Eyes:     Conjunctiva/sclera: Conjunctivae normal.  Cardiovascular:     Rate and Rhythm: Normal rate and regular rhythm.     Heart sounds: Normal heart sounds.  Pulmonary:     Effort: Pulmonary effort is normal. No respiratory distress.     Breath sounds: Normal breath sounds. No wheezing.  Musculoskeletal:     Cervical back: Neck supple.     Right lower leg: No edema.     Left lower leg: No edema.  Lymphadenopathy:     Cervical: No cervical adenopathy.  Skin:    General: Skin is warm and dry.     Findings: No rash.  Neurological:     Mental Status: She is alert. Mental status is at baseline.  Psychiatric:        Mood and Affect: Mood normal.        Behavior:  Behavior normal.        Lab Results  Component Value Date   WBC 8.3 10/25/2022   HGB 12.2 03/09/2023   HCT 36.0 03/09/2023   PLT 275.0 10/25/2022   GLUCOSE 102 (H) 03/09/2023   CHOL 131 10/25/2022   TRIG 127.0 10/25/2022   HDL 61.40 10/25/2022   LDLDIRECT 114.0 05/02/2016   LDLCALC 44 10/25/2022   ALT 25 10/25/2022   AST 24 10/25/2022   NA 142 03/09/2023   K 4.2 03/09/2023   CL 107 03/09/2023   CREATININE 1.00 03/09/2023   BUN 20 03/09/2023   CO2 30 10/25/2022   TSH 4.150 01/18/2022   INR 0.9 01/18/2022   HGBA1C 6.7 (H) 10/25/2022   MICROALBUR 1.2 10/25/2022     Assessment & Plan:    See Problem List for Assessment and Plan of chronic medical problems.

## 2023-04-27 ENCOUNTER — Ambulatory Visit (INDEPENDENT_AMBULATORY_CARE_PROVIDER_SITE_OTHER): Payer: Medicare Other | Admitting: Internal Medicine

## 2023-04-27 VITALS — BP 120/76 | HR 68 | Temp 98.1°F | Ht 62.5 in | Wt 160.0 lb

## 2023-04-27 DIAGNOSIS — R600 Localized edema: Secondary | ICD-10-CM | POA: Insufficient documentation

## 2023-04-27 DIAGNOSIS — E782 Mixed hyperlipidemia: Secondary | ICD-10-CM

## 2023-04-27 DIAGNOSIS — I10 Essential (primary) hypertension: Secondary | ICD-10-CM | POA: Diagnosis not present

## 2023-04-27 DIAGNOSIS — E1169 Type 2 diabetes mellitus with other specified complication: Secondary | ICD-10-CM | POA: Diagnosis not present

## 2023-04-27 DIAGNOSIS — Z860100 Personal history of colon polyps, unspecified: Secondary | ICD-10-CM

## 2023-04-27 DIAGNOSIS — R252 Cramp and spasm: Secondary | ICD-10-CM

## 2023-04-27 LAB — COMPREHENSIVE METABOLIC PANEL
ALT: 21 U/L (ref 0–35)
AST: 22 U/L (ref 0–37)
Albumin: 4.4 g/dL (ref 3.5–5.2)
Alkaline Phosphatase: 94 U/L (ref 39–117)
BUN: 22 mg/dL (ref 6–23)
CO2: 27 meq/L (ref 19–32)
Calcium: 9.9 mg/dL (ref 8.4–10.5)
Chloride: 105 meq/L (ref 96–112)
Creatinine, Ser: 1 mg/dL (ref 0.40–1.20)
GFR: 53.69 mL/min — ABNORMAL LOW (ref >60.00)
Glucose, Bld: 113 mg/dL — ABNORMAL HIGH (ref 70–99)
Potassium: 4.2 meq/L (ref 3.5–5.1)
Sodium: 141 meq/L (ref 135–145)
Total Bilirubin: 0.5 mg/dL (ref 0.2–1.2)
Total Protein: 7.9 g/dL (ref 6.0–8.3)

## 2023-04-27 LAB — LIPID PANEL
Cholesterol: 116 mg/dL (ref 0–200)
HDL: 59 mg/dL (ref >39.00)
LDL Cholesterol: 38 mg/dL (ref 0–99)
NonHDL: 56.99
Total CHOL/HDL Ratio: 2
Triglycerides: 94 mg/dL (ref 0.0–149.0)
VLDL: 18.8 mg/dL (ref 0.0–40.0)

## 2023-04-27 LAB — HEMOGLOBIN A1C: Hgb A1c MFr Bld: 6.4 % (ref 4.6–6.5)

## 2023-04-27 NOTE — Assessment & Plan Note (Signed)
Chronic Check lipid panel  Continue crestor 5 mg daily Regular exercise and healthy diet encouraged  

## 2023-04-27 NOTE — Assessment & Plan Note (Signed)
Chronic Lab Results  Component Value Date   HGBA1C 6.7 (H) 10/25/2022   Sugars have been controlled with lifestyle Check A1c Diabetic diet, exercise encouraged

## 2023-04-27 NOTE — Assessment & Plan Note (Signed)
Subacute Getting cramping at night in her legs Improved after chlorthalidone was discontinued Still having some cramping She started back taking magnesium daily which may help-she will continue this Likely not drinking enough fluids-encouraged her to increase fluid intake CMP

## 2023-04-27 NOTE — Assessment & Plan Note (Signed)
Chronic Blood pressure well-controlled Continue amlodipine 5 mg daily, Coreg 3.125 mg twice daily and valsartan 40 mg daily Continue to monitor BP at home

## 2023-04-27 NOTE — Assessment & Plan Note (Signed)
Chronic Mild Likely related to venous insufficiency Has some mild venous stasis dermatitis with minimal symptoms Stressed wearing the compression socks daily-but the mom first thing morning and taken off before bed Elevate legs when sitting Remain active, walking Low-sodium diet Would like to avoid more medication, but if swelling is not controlled could consider low-dose Lasix Dermatitis not causing symptoms-can prescribe steroid cream if needed

## 2023-04-27 NOTE — Assessment & Plan Note (Signed)
History of colon polyps and family history of colon cancer Due for colonoscopy-referral order

## 2023-05-03 ENCOUNTER — Encounter: Payer: Self-pay | Admitting: Internal Medicine

## 2023-05-16 DIAGNOSIS — M48061 Spinal stenosis, lumbar region without neurogenic claudication: Secondary | ICD-10-CM | POA: Diagnosis not present

## 2023-05-16 DIAGNOSIS — M5126 Other intervertebral disc displacement, lumbar region: Secondary | ICD-10-CM | POA: Diagnosis not present

## 2023-05-16 DIAGNOSIS — M51379 Other intervertebral disc degeneration, lumbosacral region without mention of lumbar back pain or lower extremity pain: Secondary | ICD-10-CM | POA: Diagnosis not present

## 2023-05-22 ENCOUNTER — Encounter: Payer: Self-pay | Admitting: Internal Medicine

## 2023-05-23 ENCOUNTER — Ambulatory Visit (INDEPENDENT_AMBULATORY_CARE_PROVIDER_SITE_OTHER): Payer: Medicare Other | Admitting: Internal Medicine

## 2023-05-23 ENCOUNTER — Encounter: Payer: Self-pay | Admitting: Internal Medicine

## 2023-05-23 VITALS — BP 126/76 | HR 67 | Temp 98.3°F | Ht 62.5 in | Wt 161.0 lb

## 2023-05-23 DIAGNOSIS — I10 Essential (primary) hypertension: Secondary | ICD-10-CM

## 2023-05-23 DIAGNOSIS — N644 Mastodynia: Secondary | ICD-10-CM | POA: Insufficient documentation

## 2023-05-23 NOTE — Assessment & Plan Note (Signed)
Chronic Blood pressure well-controlled Continue amlodipine 5 mg daily, Coreg 3.125 mg twice daily and valsartan 40 mg daily Continue to monitor BP at home

## 2023-05-23 NOTE — Patient Instructions (Addendum)
       A diagnostic mammogram and breast ultrasound which they will do if needed was ordered for [facility] in [place].

## 2023-05-23 NOTE — Assessment & Plan Note (Addendum)
Acute Started one week ago Left lower breast is very tender - nipple area and from 5-7 o'clock, right breast is minimally tender in lower breast - 6 o'clock Diagnostic mammogram Bilateral breast ultrasound including axilla ordered No new medications that would be consistent with gynecomastia

## 2023-05-23 NOTE — Progress Notes (Signed)
Subjective:    Patient ID: [name], female    DOB: [birth year], 77 y.o.   MRN: [id number]      HPI [name] is here for  Chief Complaint  Patient presents with   Breast Pain    Bilateral breast pain, Left hurts more than worse    B/l breast discomfort x about one week.  She woke up with feeling of the left breast being uncomfortable.  When she felt both of them the left is much more tender than the right.  She has not felt any discrete lumps.  No skin changes or other symptoms.    It has not gotten better.  No new meds.  Last mammogram one year ago - due for mammo now.     Medications and allergies reviewed with patient and updated if appropriate.  Current Outpatient Medications on File Prior to Visit  Medication Sig Dispense Refill   acetaminophen (TYLENOL) 500 MG tablet Take 2 tablets (1,000 mg total) by mouth 3 (three) times daily as needed.     amLODipine (NORVASC) 5 MG tablet Take 1 tablet (5 mg total) by mouth daily. 90 tablet 3   Ascorbic Acid (VITAMIN C) 1000 MG tablet Take 1 tablet (1,000 mg total) by mouth 2 (two) times daily.     aspirin 81 MG tablet Take 81 mg by mouth daily.     carvedilol (COREG) 3.125 MG tablet TAKE 1 TABLET BY MOUTH TWICE A DAY 180 tablet 3   Cholecalciferol (VITAMIN D-3) 25 MCG (1000 UT) CAPS Take 10,000 Units by mouth daily.      diclofenac Sodium (VOLTAREN) 1 % GEL Apply topically 4 (four) times daily as needed.      EPINEPHrine (EPIPEN 2-PAK) 0.3 mg/0.3 mL IJ SOAJ injection Use as directed for severe allergic reactions 2 each 1   MAGNESIUM GLYCINATE PO Take 240 mg by mouth daily.     naloxone (NARCAN) 4 MG/0.1ML LIQD nasal spray kit      Omega-3 Fatty Acids (FISH OIL BURP-LESS) 1000 MG CAPS Takes 1280 mg 2 caps daiily 60 capsule 0   rosuvastatin (CRESTOR) 5 MG tablet Take 1 tablet (5 mg total) by mouth daily. (Patient taking differently: Take 5 mg by mouth every evening.) 30 tablet 11   traMADol (ULTRAM) 50 MG tablet Take 1 tablet (50  mg total) by mouth every 6 (six) hours as needed. 15 tablet 0   valsartan (DIOVAN) 40 MG tablet Take 1 tablet (40 mg total) by mouth daily. 30 tablet 3   vitamin B-12 (CYANOCOBALAMIN) 1000 MCG tablet Take 1,000 mcg by mouth 2 (two) times daily.      vitamin E 400 UNIT capsule Take 400 Units by mouth daily.     zinc gluconate 50 MG tablet Take 50 mg by mouth daily.     No current facility-administered medications on file prior to visit.    Review of Systems     Objective:   Vitals:   05/23/23 1257  BP: 126/76  Pulse: 67  Temp: 98.3 F (36.8 C)  SpO2: 95%   BP Readings from Last 3 Encounters:  05/23/23 126/76  04/27/23 120/76  03/26/23 106/60   Wt Readings from Last 3 Encounters:  05/23/23 161 lb (73 kg)  04/27/23 160 lb (72.6 kg)  03/26/23 162 lb (73.5 kg)   Body mass index is 28.98 kg/m.    Physical Exam Constitutional:      General: She is not in acute distress.  Appearance: Normal appearance. She is not ill-appearing.  HENT:     Head: Normocephalic and atraumatic.  Chest:     Chest wall: No deformity, swelling or tenderness.  Breasts:    Right: Tenderness (Very minimal around 6:00) present. No swelling, mass, nipple discharge or skin change.     Left: Tenderness (Tenderness the area of the areola and in the lower breast approximately from 5:00-7:00) present. No swelling, mass, nipple discharge or skin change.  Lymphadenopathy:     Upper Body:     Right upper body: No supraclavicular, axillary or pectoral adenopathy.     Left upper body: No supraclavicular, axillary or pectoral adenopathy.  Skin:    General: Skin is warm and dry.  Neurological:     Mental Status: She is alert.            Assessment & Plan:    See Problem List for Assessment and Plan of chronic medical problems.

## 2023-05-25 DIAGNOSIS — M791 Myalgia, unspecified site: Secondary | ICD-10-CM | POA: Diagnosis not present

## 2023-05-25 DIAGNOSIS — M810 Age-related osteoporosis without current pathological fracture: Secondary | ICD-10-CM | POA: Diagnosis not present

## 2023-05-25 DIAGNOSIS — M48061 Spinal stenosis, lumbar region without neurogenic claudication: Secondary | ICD-10-CM | POA: Diagnosis not present

## 2023-05-25 DIAGNOSIS — G894 Chronic pain syndrome: Secondary | ICD-10-CM | POA: Diagnosis not present

## 2023-05-25 DIAGNOSIS — M461 Sacroiliitis, not elsewhere classified: Secondary | ICD-10-CM | POA: Diagnosis not present

## 2023-05-25 DIAGNOSIS — M47896 Other spondylosis, lumbar region: Secondary | ICD-10-CM | POA: Diagnosis not present

## 2023-05-30 DIAGNOSIS — M25512 Pain in left shoulder: Secondary | ICD-10-CM | POA: Diagnosis not present

## 2023-05-31 ENCOUNTER — Encounter: Payer: Self-pay | Admitting: Internal Medicine

## 2023-05-31 DIAGNOSIS — N644 Mastodynia: Secondary | ICD-10-CM | POA: Diagnosis not present

## 2023-05-31 DIAGNOSIS — R92323 Mammographic fibroglandular density, bilateral breasts: Secondary | ICD-10-CM | POA: Diagnosis not present

## 2023-05-31 LAB — HM MAMMOGRAPHY

## 2023-06-03 ENCOUNTER — Other Ambulatory Visit: Payer: Self-pay | Admitting: Physician Assistant

## 2023-06-10 ENCOUNTER — Other Ambulatory Visit: Payer: Self-pay | Admitting: Internal Medicine

## 2023-06-20 DIAGNOSIS — H10413 Chronic giant papillary conjunctivitis, bilateral: Secondary | ICD-10-CM | POA: Diagnosis not present

## 2023-06-26 DIAGNOSIS — M159 Polyosteoarthritis, unspecified: Secondary | ICD-10-CM | POA: Diagnosis not present

## 2023-06-26 DIAGNOSIS — M47896 Other spondylosis, lumbar region: Secondary | ICD-10-CM | POA: Diagnosis not present

## 2023-06-26 DIAGNOSIS — M810 Age-related osteoporosis without current pathological fracture: Secondary | ICD-10-CM | POA: Diagnosis not present

## 2023-06-26 DIAGNOSIS — M48061 Spinal stenosis, lumbar region without neurogenic claudication: Secondary | ICD-10-CM | POA: Diagnosis not present

## 2023-06-26 DIAGNOSIS — M461 Sacroiliitis, not elsewhere classified: Secondary | ICD-10-CM | POA: Diagnosis not present

## 2023-06-26 DIAGNOSIS — M791 Myalgia, unspecified site: Secondary | ICD-10-CM | POA: Diagnosis not present

## 2023-06-26 DIAGNOSIS — G894 Chronic pain syndrome: Secondary | ICD-10-CM | POA: Diagnosis not present

## 2023-07-02 ENCOUNTER — Ambulatory Visit (HOSPITAL_COMMUNITY): Payer: Medicare Other | Attending: Cardiology

## 2023-07-02 DIAGNOSIS — I351 Nonrheumatic aortic (valve) insufficiency: Secondary | ICD-10-CM | POA: Insufficient documentation

## 2023-07-02 LAB — ECHOCARDIOGRAM COMPLETE
Area-P 1/2: 3.16 cm2
P 1/2 time: 578 ms
S' Lateral: 2.6 cm

## 2023-07-10 ENCOUNTER — Ambulatory Visit: Payer: Medicare Other | Admitting: Physician Assistant

## 2023-07-12 ENCOUNTER — Ambulatory Visit: Payer: Medicare Other | Admitting: Physician Assistant

## 2023-08-13 ENCOUNTER — Telehealth: Payer: Self-pay | Admitting: Internal Medicine

## 2023-08-13 NOTE — Telephone Encounter (Signed)
 Returned call to patient. Pt states that she has had an EGD in the past and was not sure if you wanted to do one at the tine of her colonoscopy/ Pt is not having any major upper GI symptoms. She reports intermittent nausea, only lasts momentarily. She states that when she takes her pills she will feel like the pill is stuck, but drinking more water helps it pass. Please advise if OK to add EGD. Thanks

## 2023-08-13 NOTE — Telephone Encounter (Signed)
 Spoke with patient this morning confirming her previsit appointment scheduled for this Friday, 1/17.  Patient is coming in person for this visit.  One thing she asked was if she could possible have an EGD done in addition to her colonoscopy.  She was told that would have to be approved by her doctor and a time available to do both procedures.  Please call patient and advise.  Thank you.

## 2023-08-13 NOTE — Telephone Encounter (Signed)
 Called and spoke with patient. We have rescheduled PV to 09/24/23 at 10:30 am (in person). EGD and colonoscopy now scheduled for Monday, 10/08/23 at 7:30 am. Pt verbalized understanding and had no concerns at the end of the call.

## 2023-08-13 NOTE — Progress Notes (Signed)
 Cardiology Office Note    Date:  08/15/2023  ID:  [name], DOB [birth year], MRN [id number] PCP:  Colene Dauphin, MD  Cardiologist:  Sonny Dust, MD (Inactive)  Electrophysiologist:  None   Chief Complaint: f/u CAD  History of Present Illness: .    [name] is a 78 y.o. 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 She has requested close cardiology follow-up on a regular basis. [name] (who has a complex medical history and for whom she is primary caregiver) and daughter [name] are also followed by our clinic.   She previously followed with Dr. Nicholette Barley for history of atypical sharp chest pain and nonobstructive CAD. Last ischemic assessment was by coronary CTA in 07/2020 with moderate stenosis in the prox and distal RCA, mild stenosis in the prox LAD with negative FFR. Event monitor for palpitations 2022 showed NSR with short runs of SVT, rare PVCs, PACs, average HR 75bpm, range 42-176 (low reading was during sleeping hours). She previously had LE edema and eye puffiness which she thought might be due to amlodipine  but did not tolerate hydrochlorothiazide  (muscle cramping) or spironolactone  (felt like unable to void) so went back to amlodipine  and has done well. At OV 09/2021, she reported intermittent fleeting dizziness with repeat monitor NSR average HR 75bpm, 20 nonsustained SVT events (longest 14 beats), <1% PACs/PVCs, triggered events mostly correlated with NSR, occasionally with PACS/PVCs and 1 run of SVT. She was started on carvedilol  for SVT + BP. She went to ER in 12/2021 for CVA r/o with reassuring workup  including CTA head/neck. Meclizine  was rx'd and recommended to f/u neurology. Chlorthalidone  was later added back in 2023 for BP. F/u echo 07/2023 to assess AI/aorta showed EF 60-65%, mild RV enlargement, moderate AI, borderline dilation of ascending aorta, stable.  She also has followed with lipid clinic. She has a history of mylagias/RLS-type neuropathy although it was not clear this was specifically related to statin itself since this also occurred while off statin in the past. LE arterial testing 2020 was normal. She was also noted to have mildly elevated CK which normalized off statin. She saw pharmD in follow-up 02/2022 given issues with statin/CK and was re-trialed on low dose rosuvastatin  with lab follow-up. F/u CK mildly elevated at 220, pharmD felt OK to continue. She saw [facility] for varicose veins 11/2022 who recommended conservative measures.    She returns for follow-up today with her daughter. Since her last visit she began having leg cramping again per PCP note so chlorthalidone  was switched to valsartan . However, she self discontinued this for the last 2 days due to an unusual episode over the weekend where she was walking in a store and felt an unusual, difficult-to-describe feeling radiate up from her stomach into her chest associated with sensation of dizziness lasting a few minutes. The feeling resolved then recurred once more at home, then resolved again. She recalls having a similar episode in the 1990s when they were stationed in [place], visiting [place]. She has a longstanding history of focal left sided chest pain but also reports one day in the last few weeks she had an episode that lasted longer than usual, not provoked by anything, less  than an hour. She is not having any clear exertional angina or dyspnea. No palpitations. BP is well controlled today. She is pending EGD and colonoscopy in March.   Labwork independently reviewed: 04/2023 A1C 6.4, K 4.2, Cr 1.00, LFTs OK, LDL 38, trig  94 09/2022 A1C 6.7, K 4.3, Cr 1.16, AST ALT OK, ESR neg, LDL 44, trig 127, Hgb 12.9, plt wnl 08/2022 Mg 2.1  ROS: .    Please see the history of present illness.  All other systems are reviewed and otherwise negative.  Studies Reviewed: [name]    EKG:  EKG is ordered today, personally reviewed, demonstrating   EKG Interpretation Date/Time:  Wednesday August 15 2023 14:00:50 EST Ventricular Rate:  52 PR Interval:  146 QRS Duration:  92 QT Interval:  428 QTC Calculation: 398 R Axis:   -51  Text Interpretation: Sinus bradycardia Low voltage QRS Left anterior fascicular block No acute changes Confirmed by Alexiss Iturralde [phone]) on 08/15/2023 2:11:59 PM       CV Studies: Cardiac studies reviewed are outlined and summarized above. Otherwise please see EMR for full report.   Current Reported Medications:.    Current Meds  Medication Sig   acetaminophen  (TYLENOL ) 500 MG tablet Take 2 tablets (1,000 mg total) by mouth 3 (three) times daily as needed.   amLODipine  (NORVASC ) 5 MG tablet Take 1 tablet (5 mg total) by mouth daily.   Ascorbic Acid (VITAMIN C ) 1000 MG tablet Take 1 tablet (1,000 mg total) by mouth 2 (two) times daily.   aspirin  81 MG tablet Take 81 mg by mouth daily.   carvedilol  (COREG ) 3.125 MG tablet TAKE 1 TABLET BY MOUTH TWICE A DAY   Cholecalciferol (VITAMIN D -3) 25 MCG (1000 UT) CAPS Take 10,000 Units by mouth daily.    diclofenac  Sodium (VOLTAREN ) 1 % GEL Apply topically 4 (four) times daily as needed.    EPINEPHrine  (EPIPEN  2-PAK) 0.3 mg/0.3 mL IJ SOAJ injection Use as directed for severe allergic reactions   MAGNESIUM  GLYCINATE PO Take 240 mg by mouth daily.   naloxone (NARCAN) 4 MG/0.1ML LIQD nasal spray kit    Omega-3 Fatty Acids (FISH OIL BURP-LESS) 1000 MG CAPS Takes 1280 mg 2 caps daiily   rosuvastatin  (CRESTOR ) 5 MG tablet Take 1 tablet (5 mg total) by mouth daily. (Patient taking differently: Take 5 mg by mouth every evening.)   Ubiquinol 100 MG CAPS Take 1  tablet by mouth daily at 6 (six) AM.   valsartan  (DIOVAN ) 40 MG tablet TAKE 1 TABLET BY MOUTH EVERY DAY   vitamin B-12 (CYANOCOBALAMIN ) 1000 MCG tablet Take 1,000 mcg by mouth 2 (two) times daily.    vitamin E 400 UNIT capsule Take 400 Units by mouth daily.   zinc  gluconate 50 MG tablet Take 50 mg by mouth daily.    Physical Exam:    VS:  BP 118/68   Pulse (!) 52   Ht 5' 2.5" (1.588 m)   Wt 160 lb (72.6 kg)   SpO2 98%   BMI 28.80 kg/m    Wt Readings from Last 3 Encounters:  08/15/23 160 lb (72.6 kg)  05/23/23 161 lb (73 kg)  04/27/23 160 lb (72.6 kg)    GEN: Well nourished, well developed in no acute distress NECK: No JVD; No carotid bruits CARDIAC: RRR, no murmurs, rubs, gallops RESPIRATORY:  Clear to auscultation without rales, wheezing or rhonchi  ABDOMEN: Soft, non-tender, non-distended EXTREMITIES:  No edema; No acute deformity   Asessement and Plan:.  1. CAD, HLD, precordial pain - suspect CP is noncardiac but given that quality/description is different than prior years, will update coronary CTA scan. I will let GI Dr. Bridgett Camps know we are pursuing this; would be helpful to have prior to EGD/colonoscopy. Continue baby aspirin , carvedilol  3.125mg  BID, rosuvastatin  5mg  daily (PCP following lipids most recently). Her HR is in the 50s today so will have her take her usual carvedilol  day of the scan. Of note her chart carries remote reaction to NTG but she reports she took this each time with the two prior coronary CTAs and had no issue. Obtain f/u CBC, BMET, TSH.  2. Aortic insufficiency with mild dilation of ascending aorta - moderate AI remains with mild dilation of aorta, also some mild RV enlargement noted. We'll keep an eye on this. Anticipate echo 06/2024, can revisit closer to that time in follow-up.  3. Essential HTN - will have her stay off chlorthalidone  due to muscle cramping; she is also holding valsartan  due to the unusual episode she had a few days ago. Continue  amlodipine  and carvedilol .   4. PSVT, PACs, PVCs - generally quiescent. Continue BB with commentary above. Will watch HR going forward given the unusual constellation of symptoms she had recently. Can consider repeat event monitor if transient symptoms recur and coronary workup reassuring.    Disposition: F/u with me in 4 months, sooner if needed.  Signed, Alicja Everitt N Seibert Keeter, PA-C

## 2023-08-13 NOTE — Telephone Encounter (Signed)
 Yes, okay to add EGD Indication: nausea and pill dysphagia JMP

## 2023-08-15 ENCOUNTER — Ambulatory Visit: Payer: Medicare Other | Attending: Physician Assistant | Admitting: Physician Assistant

## 2023-08-15 ENCOUNTER — Encounter: Payer: Self-pay | Admitting: Physician Assistant

## 2023-08-15 VITALS — BP 118/68 | HR 52 | Ht 62.5 in | Wt 160.0 lb

## 2023-08-15 DIAGNOSIS — I471 Supraventricular tachycardia, unspecified: Secondary | ICD-10-CM | POA: Diagnosis not present

## 2023-08-15 DIAGNOSIS — I351 Nonrheumatic aortic (valve) insufficiency: Secondary | ICD-10-CM | POA: Insufficient documentation

## 2023-08-15 DIAGNOSIS — I77810 Thoracic aortic ectasia: Secondary | ICD-10-CM | POA: Insufficient documentation

## 2023-08-15 DIAGNOSIS — I2510 Atherosclerotic heart disease of native coronary artery without angina pectoris: Secondary | ICD-10-CM | POA: Diagnosis not present

## 2023-08-15 DIAGNOSIS — I493 Ventricular premature depolarization: Secondary | ICD-10-CM | POA: Diagnosis not present

## 2023-08-15 DIAGNOSIS — I10 Essential (primary) hypertension: Secondary | ICD-10-CM | POA: Insufficient documentation

## 2023-08-15 DIAGNOSIS — I491 Atrial premature depolarization: Secondary | ICD-10-CM | POA: Insufficient documentation

## 2023-08-15 DIAGNOSIS — E785 Hyperlipidemia, unspecified: Secondary | ICD-10-CM | POA: Diagnosis not present

## 2023-08-15 DIAGNOSIS — R072 Precordial pain: Secondary | ICD-10-CM | POA: Diagnosis not present

## 2023-08-15 NOTE — Patient Instructions (Signed)
 Medication Instructions:  Do not take chlorthalidone  or Valsartan .   *If you need a refill on your cardiac medications before your next appointment, please call your pharmacy*  Lab Work: CBC BMET TSH  If you have labs (blood work) drawn today and your tests are completely normal, you will receive your results only by: MyChart Message (if you have MyChart) OR A paper copy in the mail If you have any lab test that is abnormal or we need to change your treatment, we will call you to review the results.  Testing/Procedures: Your physician has requested that you have cardiac CT. Cardiac computed tomography (CT) is a painless test that uses an x-ray machine to take clear, detailed pictures of your heart. For further information please visit [email or web address] Please follow instruction sheet as given.    Follow-Up: At [facility], you and your health needs are our priority.  As part of our continuing mission to provide you with exceptional heart care, we have created designated Provider Care Teams.  These Care Teams include your primary Cardiologist (physician) and Advanced Practice Providers (APPs -  Physician Assistants and Nurse Practitioners) who all work together to provide you with the care you need, when you need it.  We recommend signing up for the patient portal called "MyChart".  Sign up information is provided on this After Visit Summary.  MyChart is used to connect with patients for Virtual Visits (Telemedicine).  Patients are able to view lab/test results, encounter notes, upcoming appointments, etc.  Non-urgent messages can be sent to your provider as well.   To learn more about what you can do with MyChart, go to [email or web address].    Your next appointment:   4 month(s)  The format for your next appointment:   In Person  Provider:   Graciela Lava, PA-C     Other Instructions   Your cardiac CT will be scheduled at one of the below locations:   [facility] [address] [place], [place] [address] [phone]   If scheduled at [facility], please arrive at the [facility] and Children's Entrance (Entrance C2) of [facility] 30 minutes prior to test start time. You can use the FREE valet parking offered at entrance C (encouraged to control the heart rate for the test)  Proceed to the [facility] Radiology Department (first floor) to check-in and test prep.  All radiology patients and guests should use entrance C2 at [facility], accessed from [facility], even though the hospital's physical address listed is [address]    If scheduled at [facility] or [facility], please arrive 15 mins early for check-in and test prep.  There is spacious parking and easy access to the radiology department from the [facility] entrance. Please enter here and check-in with the desk attendant.   Please follow these instructions carefully (unless otherwise directed):  An IV will be required for this test and Nitroglycerin  will be given.  Hold all erectile dysfunction medications at least 3 days (72 hrs) prior to test. (Ie viagra, cialis, sildenafil, tadalafil, etc)   On the Night Before the Test: Be sure to Drink plenty of water. Do not consume any caffeinated/decaffeinated beverages or chocolate 12 hours prior to your test. Do not take any antihistamines 12 hours prior to your test.  On the Day of the Test: Drink plenty of water until 1 hour prior to the test. Do not eat  any food 1 hour prior to test. You may take your regular medications prior to the test.  Take Coreg  two hours prior to test. If you take Furosemide /Hydrochlorothiazide /Spironolactone , please HOLD on the morning of the test. FEMALES- please wear underwire-free bra if available, avoid dresses & tight clothing After the Test: Drink plenty of water. After receiving IV  contrast, you may experience a mild flushed feeling. This is normal. On occasion, you may experience a mild rash up to 24 hours after the test. This is not dangerous. If this occurs, you can take Benadryl 25 mg and increase your fluid intake. If you experience trouble breathing, this can be serious. If it is severe call 911 IMMEDIATELY. If it is mild, please call our office. If you take any of these medications: Glipizide/Metformin, Avandament, Glucavance, please do not take 48 hours after completing test unless otherwise instructed.  We will call to schedule your test 2-4 weeks out understanding that some insurance companies will need an authorization prior to the service being performed.   For more information and frequently asked questions, please visit our website : [email or web address]  For non-scheduling related questions, please contact the cardiac imaging nurse navigator should you have any questions/concerns: Cardiac Imaging Nurse Navigators Direct Office Dial: [phone]   For scheduling needs, including cancellations and rescheduling, please call [place], [phone].

## 2023-08-16 LAB — BASIC METABOLIC PANEL
BUN/Creatinine Ratio: 21 (ref 12–28)
BUN: 22 mg/dL (ref 8–27)
CO2: 24 mmol/L (ref 20–29)
Calcium: 9.9 mg/dL (ref 8.7–10.3)
Chloride: 103 mmol/L (ref 96–106)
Creatinine, Ser: 1.07 mg/dL — ABNORMAL HIGH (ref 0.57–1.00)
Glucose: 100 mg/dL — ABNORMAL HIGH (ref 70–99)
Potassium: 4.5 mmol/L (ref 3.5–5.2)
Sodium: 141 mmol/L (ref 134–144)
eGFR: 53 mL/min/{1.73_m2} — ABNORMAL LOW (ref >59)

## 2023-08-16 LAB — CBC
Hematocrit: 37.3 % (ref 34.0–46.6)
Hemoglobin: 12.1 g/dL (ref 11.1–15.9)
MCH: 29.4 pg (ref 26.6–33.0)
MCHC: 32.4 g/dL (ref 31.5–35.7)
MCV: 91 fL (ref 79–97)
Platelets: 252 10*3/uL (ref 150–450)
RBC: 4.12 x10E6/uL (ref 3.77–5.28)
RDW: 12.4 % (ref 11.7–15.4)
WBC: 6.9 10*3/uL (ref 3.4–10.8)

## 2023-08-16 LAB — TSH: TSH: 2.41 u[IU]/mL (ref 0.450–4.500)

## 2023-08-22 ENCOUNTER — Ambulatory Visit (INDEPENDENT_AMBULATORY_CARE_PROVIDER_SITE_OTHER): Payer: Medicare Other

## 2023-08-22 DIAGNOSIS — Z23 Encounter for immunization: Secondary | ICD-10-CM

## 2023-08-24 NOTE — Progress Notes (Signed)
Patient visits today to receive her flu vaccine. Patient was informed and tolerated well. Patient was notified to reach out to [place] if needed.

## 2023-08-31 ENCOUNTER — Ambulatory Visit (INDEPENDENT_AMBULATORY_CARE_PROVIDER_SITE_OTHER): Payer: Medicare Other

## 2023-08-31 VITALS — BP 140/86 | HR 68 | Ht 62.5 in | Wt 159.0 lb

## 2023-08-31 DIAGNOSIS — Z0000 Encounter for general adult medical examination without abnormal findings: Secondary | ICD-10-CM

## 2023-08-31 NOTE — Progress Notes (Signed)
Subjective:   [name] is a 78 y.o. female who presents for Medicare Annual (Subsequent) preventive examination.  Visit Complete: In person   Cardiac Risk Factors include: advanced age (>56men, >48 women);hypertension;diabetes mellitus;dyslipidemia;Other (see comment), Risk factor comments: OSA     Objective:    Today's Vitals   08/31/23 0949  BP: (!) 140/86  Pulse: 68  SpO2: 99%  Weight: 159 lb (72.1 kg)  Height: 5' 2.5" (1.588 m)   Body mass index is 28.62 kg/m.     08/31/2023    9:53 AM 03/09/2023    8:05 AM 03/18/2022    1:10 PM 03/17/2022    3:58 PM 01/18/2022    3:30 PM 07/13/2021    2:25 PM 03/16/2021    9:31 AM  Advanced Directives  Does Patient Have a Medical Advance Directive? Yes Yes Yes Yes No Yes Yes  Type of Estate agent of [place];Living will Healthcare Power of [place];Living will Healthcare Power of [place];Living will Living will;Healthcare Power of Attorney   Living will;Healthcare Power of Attorney  Does patient want to make changes to medical advance directive?    No - Patient declined   No - Patient declined  Copy of Healthcare Power of Attorney in Chart? No - copy requested No - copy requested  No - copy requested   No - copy requested    Current Medications (verified) Outpatient Encounter Medications as of 08/31/2023  Medication Sig   acetaminophen (TYLENOL) 500 MG tablet Take 2 tablets (1,000 mg total) by mouth 3 (three) times daily as needed.   amLODipine (NORVASC) 5 MG tablet Take 1 tablet (5 mg total) by mouth daily.   Ascorbic Acid (VITAMIN C) 1000 MG tablet Take 1 tablet (1,000 mg total) by mouth 2 (two) times daily.   aspirin 81 MG tablet Take 81 mg by mouth daily.   carvedilol (COREG) 3.125 MG tablet TAKE 1 TABLET BY MOUTH TWICE A DAY   Cholecalciferol (VITAMIN D-3) 25 MCG (1000 UT) CAPS Take 10,000 Units by mouth daily.    diclofenac Sodium (VOLTAREN) 1 % GEL Apply topically 4 (four) times daily as needed.     EPINEPHrine (EPIPEN 2-PAK) 0.3 mg/0.3 mL IJ SOAJ injection Use as directed for severe allergic reactions   MAGNESIUM GLYCINATE PO Take 240 mg by mouth daily.   naloxone (NARCAN) 4 MG/0.1ML LIQD nasal spray kit    Omega-3 Fatty Acids (FISH OIL BURP-LESS) 1000 MG CAPS Takes 1280 mg 2 caps daiily   rosuvastatin (CRESTOR) 5 MG tablet Take 1 tablet (5 mg total) by mouth daily. (Patient taking differently: Take 5 mg by mouth every evening.)   Ubiquinol 100 MG CAPS Take 1 tablet by mouth daily at 6 (six) AM.   vitamin B-12 (CYANOCOBALAMIN) 1000 MCG tablet Take 1,000 mcg by mouth 2 (two) times daily.    vitamin E 400 UNIT capsule Take 400 Units by mouth daily.   zinc gluconate 50 MG tablet Take 50 mg by mouth daily.   No facility-administered encounter medications on file as of 08/31/2023.    Allergies (verified) Bactrim [sulfamethoxazole-trimethoprim], Nitroglycerin, Pentosan polysulfate, Pentosan polysulfate sodium, Promethazine hcl, Sulfamethoxazole-trimethoprim, Losartan, Promethazine hcl, and Lisinopril   History: Past Medical History:  Diagnosis Date   Allergy    seasonal   Ankylosing spondylitis (HCC)    Anxiety    Arthritis    Bell palsy    yrs ago   CAD (coronary artery disease)    a. Coronary CT 10/2016 showed calcium score  in the 59th perecntile, moderate CAD in RCA/PDA and narrowing of the mid to distal LAD, with negative FFR.   Cystitis    Diverticulosis of colon    Dry eye syndrome    Exocrine pancreatic insufficiency    Frozen shoulder on left    GERD (gastroesophageal reflux disease)    history of CTS (carpal tunnel syndrome)    History of kidney stones    HLA B27 (HLA B27 positive)    Hx of colonic polyps    Hyperlipidemia    NMR 2005   Hypertension    Hypothyroidism    Incontinence    Internal hemorrhoids    Interstitial cystitis    Menopause    Mild aortic insufficiency    Mild ascending aorta dilatation (HCC)    [facility]    Osteoarthritis    shoulder, ankles, hands   Osteopenia    [facility] [address]   Pneumonia    2 x as child   Pre-diabetes    PSVT (paroxysmal supraventricular tachycardia) (HCC)    S/P total hysterectomy and bilateral salpingo-oophorectomy 04/12/2014   Sinusitis    Sleep apnea    c-pap   Subjective visual disturbance of both eyes 12/30/2013   Tubular adenoma of colon    Vitamin D deficiency    Xerostomia    and Xeroophthalmia(oral dryness)   Past Surgical History:  Procedure Laterality Date   BLADDER SURGERY     for incontinence   CARPAL TUNNEL RELEASE Left 12/22/2014   Procedure: LEFT CARPAL TUNNEL RELEASE;  Surgeon: Cindee Salt, MD;  Location: [facility];  Service: Orthopedics;  Laterality: Left;   CARPAL TUNNEL RELEASE Right 02/18/2019   Procedure: RIGHT CARPAL TUNNEL RELEASE;  Surgeon: Cindee Salt, MD;  Location: [facility];  Service: Orthopedics;  Laterality: Right;   cataract excision Bilateral 2022   COLONOSCOPY W/ POLYPECTOMY     adenomatous poyp 04-2008   colonscopy     tics 10-2002   CYSTOSCOPY WITH RETROGRADE PYELOGRAM, URETEROSCOPY AND STENT PLACEMENT Left 09/01/2013   Procedure: CYSTOSCOPY WITH RETROGRADE PYELOGRAM, AND LEFT STENT PLACEMENT;  Surgeon: Milford Cage, MD;  Location: [facility];  Service: Urology;  Laterality: Left;   EXCISION OF KELOID N/A 03/09/2023   Procedure: EXCISIONIONAL BIOPSY SEBACEOUS CYST LEFT BUTTOCK;  Surgeon: Romie Levee, MD;  Location: [facility];  Service: General;  Laterality: N/A;   FINGER SURGERY Left 2004   2nd finger   G 1 P 1     interstitial cystitis     with clot in catheter   SHOULDER ARTHROSCOPY Left    TOTAL ABDOMINAL HYSTERECTOMY W/ BILATERAL SALPINGOOPHORECTOMY     dysfunctional menses   Family History  Problem Relation Age of Onset   Coronary artery disease Mother    Hypertension Mother    Asthma Mother    Dementia Mother    Stroke Mother         mini cva   Diabetes Father    Cirrhosis Father        non alcoholic   Colon cancer Sister 104   Allergic rhinitis Sister    Allergic rhinitis Sister    Food Allergy Sister    Renal cancer Paternal Aunt        x 2   Coronary artery disease Maternal Grandfather    Breast cancer Maternal Aunt    Diabetes Paternal Aunt    Diabetes Paternal Grandmother  Esophageal cancer Neg Hx    Stomach cancer Neg Hx    Social History   Socioeconomic History   Marital status: Married    Spouse name: Joe   Number of children: 1   Years of education: BS   Highest education level: Not on file  Occupational History   Occupation: retired   Occupation: Runner, broadcasting/film/video    Comment: Kindergarten  Tobacco Use   Smoking status: Never   Smokeless tobacco: Never  Vaping Use   Vaping status: Never Used  Substance and Sexual Activity   Alcohol use: Yes    Comment: rare   Drug use: No   Sexual activity: Not Currently  Other Topics Concern   Not on file  Social History Narrative   Lives at home with husband, is his caregiver   No daily use of caffeine.   Right-handed.         Social Drivers of Corporate investment banker Strain: Low Risk  (08/31/2023)   Overall Financial Resource Strain (CARDIA)    Difficulty of Paying Living Expenses: Not hard at all  Food Insecurity: No Food Insecurity (08/31/2023)   Hunger Vital Sign    Worried About Running Out of Food in the Last Year: Never true    Ran Out of Food in the Last Year: Never true  Transportation Needs: No Transportation Needs (08/31/2023)   PRAPARE - Administrator, Civil Service (Medical): No    Lack of Transportation (Non-Medical): No  Physical Activity: Insufficiently Active (08/31/2023)   Exercise Vital Sign    Days of Exercise per Week: 2 days    Minutes of Exercise per Session: 20 min  Stress: Stress Concern Present (08/31/2023)   [employer] of Occupational Health - Occupational Stress Questionnaire    Feeling of Stress :  To some extent  Social Connections: Moderately Isolated (08/31/2023)   Social Connection and Isolation Panel [NHANES]    Frequency of Communication with Friends and Family: More than three times a week    Frequency of Social Gatherings with Friends and Family: Never    Attends Religious Services: Never    Database administrator or Organizations: No    Attends Engineer, structural: Never    Marital Status: Married    Tobacco Counseling Counseling given: Not Answered   Clinical Intake:  Pre-visit preparation completed: Yes  Pain : No/denies pain     BMI - recorded: 28.62 Nutritional Status: BMI 25 -29 Overweight Nutritional Risks: None Diabetes: Yes CBG done?: No Did pt. bring in CBG monitor from home?: No  How often do you need to have someone help you when you read instructions, pamphlets, or other written materials from your doctor or pharmacy?: 1 - Never  Interpreter Needed?: No  Information entered by :: Kavontae Pritchard, RMA   Activities of Daily Living    08/31/2023    8:46 AM 03/09/2023    8:13 AM  In your present state of health, do you have any difficulty performing the following activities:  Hearing? 0 0  Vision? 0 0  Difficulty concentrating or making decisions? 0 0  Walking or climbing stairs? 0 0  Dressing or bathing? 0 0  Doing errands, shopping? 0   Preparing Food and eating ? N   Using the Toilet? N   In the past six months, have you accidently leaked urine? N   Do you have problems with loss of bowel control? N   Managing your Medications? N  Managing your Finances? N   Housekeeping or managing your Housekeeping? N     Patient Care Team: Pincus Sanes, MD as PCP - General (Internal Medicine) Meriam Sprague, MD (Inactive) as PCP - Cardiology (Cardiology) Pyrtle, Carie Caddy, MD as Consulting Physician (Gastroenterology) Judi Saa, DO as Consulting Physician (Family Medicine) Antony Contras, MD as Consulting Physician  (Ophthalmology)  Indicate any recent Medical Services you may have received from other than [facility] providers in the past year (date may be approximate).     Assessment:   This is a routine wellness examination for [place].  Hearing/Vision screen Hearing Screening - Comments:: Denies hearing difficulties   Vision Screening - Comments:: Wears eyeglasses for reading   Goals Addressed             This Visit's Progress    DIET - INCREASE WATER INTAKE   On track    Stay healthy.      Depression Screen    08/31/2023    9:54 AM 03/26/2023    2:06 PM 03/07/2023    4:05 PM 10/25/2022    8:05 AM 09/27/2022    2:55 PM 03/20/2022    3:38 PM 03/17/2022    3:52 PM  PHQ 2/9 Scores  PHQ - 2 Score 0 0 0 0 0 0 0  PHQ- 9 Score 0  0 0 0      Fall Risk    08/31/2023    9:53 AM 03/07/2023    4:04 PM 10/25/2022    8:05 AM 09/27/2022    2:55 PM 03/20/2022    3:38 PM  Fall Risk   Falls in the past year? 0 0 0 0 0  Number falls in past yr:  0 0 0 0  Injury with Fall?  0 0 0 0  Risk for fall due to :  No Fall Risks No Fall Risks No Fall Risks No Fall Risks  Follow up Falls evaluation completed;Falls prevention discussed Falls evaluation completed Falls evaluation completed Falls evaluation completed Falls evaluation completed    MEDICARE RISK AT HOME: Medicare Risk at Home Any stairs in or around the home?: Yes If so, are there any without handrails?: Yes Home free of loose throw rugs in walkways, pet beds, electrical cords, etc?: Yes Adequate lighting in your home to reduce risk of falls?: Yes Life alert?: No Use of a cane, walker or w/c?: No Grab bars in the bathroom?: Yes Shower chair or bench in shower?: No Elevated toilet seat or a handicapped toilet?: Yes  TIMED UP AND GO:  Was the test performed?  Yes  Length of time to ambulate 10 feet: 15 sec Gait steady and fast without use of assistive device    Cognitive Function:        08/31/2023    8:46 AM 03/17/2022    4:01 PM  6CIT  Screen  What Year? 0 points 0 points  What month? 0 points 0 points  What time? 0 points 0 points  Count back from 20 0 points 0 points  Months in reverse 0 points 0 points  Repeat phrase 0 points 0 points  Total Score 0 points 0 points    Immunizations Immunization History  Administered Date(s) Administered   Fluad Quad(high Dose 65+) 04/02/2019, 04/20/2020, 04/17/2022   Fluad Trivalent(High Dose 65+) 08/22/2023   Fluzone Influenza virus vaccine,trivalent (IIV3), split virus 08/01/2003, 05/29/2007, 04/15/2008, 04/30/2009, 05/03/2010   Influenza Split 05/09/2011, 05/07/2012   Influenza Whole 08/01/2003, 05/29/2007, 04/15/2008, 04/30/2009, 05/03/2010  Influenza, High Dose Seasonal PF 05/01/2013, 04/18/2014, 04/13/2016, 05/02/2017, 04/15/2018   Influenza,inj,Quad PF,6+ Mos 05/21/2015   Influenza-Unspecified 05/09/2011, 05/07/2012, 05/21/2015, 05/02/2017   PFIZER(Purple Top)SARS-COV-2 Vaccination 09/13/2019, 10/08/2019, 06/04/2020   Pneumococcal Conjugate-13 02/25/2015   Pneumococcal Polysaccharide-23 07/31/2005, 05/27/2012   Td 06/27/2010   Tdap 08/17/2020   Zoster Recombinant(Shingrix) 06/15/2017, 08/20/2017   Zoster, Live 07/19/2012   Zoster, Unspecified 06/15/2017, 08/20/2017    TDAP status: Up to date  Flu Vaccine status: Up to date  Pneumococcal vaccine status: Up to date  Covid-19 vaccine status: Declined, Education has been provided regarding the importance of this vaccine but patient still declined. Advised may receive this vaccine at local pharmacy or Health Dept.or vaccine clinic. Aware to provide a copy of the vaccination record if obtained from local pharmacy or Health Dept. Verbalized acceptance and understanding.  Qualifies for Shingles Vaccine? Yes   Zostavax completed Yes   Shingrix Completed?: Yes  Screening Tests Health Maintenance  Topic Date Due   OPHTHALMOLOGY EXAM  05/14/2019   Colonoscopy  01/24/2023   COVID-19 Vaccine (4 - 2024-25 season)  04/01/2023   Diabetic kidney evaluation - Urine ACR  10/25/2023   HEMOGLOBIN A1C  10/25/2023   FOOT EXAM  11/29/2023   DEXA SCAN  08/11/2024   Diabetic kidney evaluation - eGFR measurement  08/14/2024   Medicare Annual Wellness (AWV)  08/30/2024   DTaP/Tdap/Td (3 - Td or Tdap) 08/17/2030   Pneumonia Vaccine 34+ Years old  Completed   INFLUENZA VACCINE  Completed   Hepatitis C Screening  Completed   Zoster Vaccines- Shingrix  Completed   HPV VACCINES  Aged Out    Health Maintenance  Health Maintenance Due  Topic Date Due   OPHTHALMOLOGY EXAM  05/14/2019   Colonoscopy  01/24/2023   COVID-19 Vaccine (4 - 2024-25 season) 04/01/2023    Colorectal cancer screening: Referral to GI placed 04/27/2023. Pt aware the office will call re: appt.  Mammogram status: Completed 05/31/2023. Repeat every year  Bone Density status: Completed 08/11/2022. Results reflect: Bone density results: OSTEOPENIA. Repeat every 2 years.  Lung Cancer Screening: (Low Dose CT Chest recommended if Age 15-80 years, 20 pack-year currently smoking OR have quit w/in 15years.) does not qualify.   Lung Cancer Screening Referral: N/A  Additional Screening:  Hepatitis C Screening: does qualify; Completed 05/21/2015  Vision Screening: Recommended annual ophthalmology exams for early detection of glaucoma and other disorders of the eye. Is the patient up to date with their annual eye exam?  Yes  Who is the provider or what is the name of the office in which the patient attends annual eye exams? Dr. Emily Filbert If pt is not established with a provider, would they like to be referred to a provider to establish care? No .   Dental Screening: Recommended annual dental exams for proper oral hygiene  Diabetic Foot Exam: Diabetic Foot Exam: Completed 11/29/2022  Community Resource Referral / Chronic Care Management: CRR required this visit?  No   CCM required this visit?  No     Plan:     I have personally reviewed and  noted the following in the patient's chart:   Medical and social history Use of alcohol, tobacco or illicit drugs  Current medications and supplements including opioid prescriptions. Patient is not currently taking opioid prescriptions. Functional ability and status Nutritional status Physical activity Advanced directives List of other physicians Hospitalizations, surgeries, and ER visits in previous 12 months Vitals Screenings to include cognitive, depression, and falls Referrals and  appointments  In addition, I have reviewed and discussed with patient certain preventive protocols, quality metrics, and best practice recommendations. A written personalized care plan for preventive services as well as general preventive health recommendations were provided to patient.     Hillel Card L Sahir Tolson, CMA   08/31/2023   After Visit Summary: (MyChart) Due to this being a telephonic visit, the after visit summary with patients personalized plan was offered to patient via MyChart   Nurse Notes: Patient stated that she is scheduled for her colonoscopy in March 2025.  She also stated that she has had her eye exam for this past year.  I will send a request for records to [facility]  Ophthalmology.  Patient had no other concerns to address today.

## 2023-08-31 NOTE — Patient Instructions (Signed)
[name] , Thank you for taking time to come for your Medicare Wellness Visit. I appreciate your ongoing commitment to your health goals. Please review the following plan we discussed and let me know if I can assist you in the future.   Referrals/Orders/Follow-Ups/Clinician Recommendations: It was nice to meet you today.  Keep up the good work.  This is a list of the screening recommended for you and due dates:  Health Maintenance  Topic Date Due   Eye exam for diabetics  05/14/2019   Colon Cancer Screening  01/24/2023   COVID-19 Vaccine (4 - 2024-25 season) 04/01/2023   Yearly kidney health urinalysis for diabetes  10/25/2023   Hemoglobin A1C  10/25/2023   Complete foot exam   11/29/2023   DEXA scan (bone density measurement)  08/11/2024   Yearly kidney function blood test for diabetes  08/14/2024   Medicare Annual Wellness Visit  08/30/2024   DTaP/Tdap/Td vaccine (3 - Td or Tdap) 08/17/2030   Pneumonia Vaccine  Completed   Flu Shot  Completed   Hepatitis C Screening  Completed   Zoster (Shingles) Vaccine  Completed   HPV Vaccine  Aged Out    Advanced directives: (Copy Requested) Please bring a copy of your health care power of attorney and living will to the office to be added to your chart at your convenience.  Next Medicare Annual Wellness Visit scheduled for next year: Yes

## 2023-09-04 ENCOUNTER — Telehealth (HOSPITAL_COMMUNITY): Payer: Self-pay | Admitting: Emergency Medicine

## 2023-09-04 NOTE — Telephone Encounter (Signed)
 Attempted to call patient regarding upcoming cardiac CT appointment. Left message on voicemail with name and callback number Rockwell Alexandria RN Navigator Cardiac Imaging [facility] [phone] Office [phone] Cell

## 2023-09-05 ENCOUNTER — Ambulatory Visit (HOSPITAL_COMMUNITY)
Admission: RE | Admit: 2023-09-05 | Discharge: 2023-09-05 | Disposition: A | Discharge status: Home / Self Care | Payer: Medicare Other | Source: Ambulatory Visit | Attending: Physician Assistant | Admitting: Physician Assistant

## 2023-09-05 ENCOUNTER — Other Ambulatory Visit: Payer: Self-pay | Admitting: Cardiology

## 2023-09-05 ENCOUNTER — Ambulatory Visit (HOSPITAL_BASED_OUTPATIENT_CLINIC_OR_DEPARTMENT_OTHER)
Admission: RE | Admit: 2023-09-05 | Discharge: 2023-09-05 | Disposition: A | Discharge status: Home / Self Care | Payer: Medicare Other | Source: Ambulatory Visit | Attending: Cardiology | Admitting: Cardiology

## 2023-09-05 DIAGNOSIS — R072 Precordial pain: Secondary | ICD-10-CM | POA: Diagnosis not present

## 2023-09-05 DIAGNOSIS — I2510 Atherosclerotic heart disease of native coronary artery without angina pectoris: Secondary | ICD-10-CM | POA: Diagnosis not present

## 2023-09-05 DIAGNOSIS — R931 Abnormal findings on diagnostic imaging of heart and coronary circulation: Secondary | ICD-10-CM | POA: Diagnosis not present

## 2023-09-05 MED ORDER — NITROGLYCERIN 0.4 MG SL SUBL
SUBLINGUAL_TABLET | SUBLINGUAL | Status: AC
  Filled 2023-09-05: qty 2

## 2023-09-05 MED ORDER — NITROGLYCERIN 0.4 MG SL SUBL
0.8000 mg | SUBLINGUAL_TABLET | Freq: Once | SUBLINGUAL | Status: AC
  Administered 2023-09-05: 0.8 mg via SUBLINGUAL

## 2023-09-05 MED ORDER — IOHEXOL 350 MG/ML SOLN
95.0000 mL | Freq: Once | INTRAVENOUS | Status: AC | PRN
  Administered 2023-09-05: 95 mL via INTRAVENOUS

## 2023-09-07 ENCOUNTER — Encounter: Payer: Medicare Other | Admitting: Internal Medicine

## 2023-09-07 ENCOUNTER — Telehealth: Payer: Self-pay

## 2023-09-07 NOTE — Telephone Encounter (Signed)
 Called and left detailed message for patient informing her she is cleared to have her procedures in our endoscopy center. To call back if she has any questions.

## 2023-09-07 NOTE — Telephone Encounter (Signed)
 Pyrtle, Gordy HERO, MD  Marcello Alan CROME, CMA Cc: Dyan Rush, CRNA Please let pt know she is cleared for EGD/colon with me per cardiology Rush Dyan - FYI JMP       Previous Messages    ----- Message ----- From: Abigail Raphael LOISE DEVONNA Sent: 09/05/2023   3:05 PM EST To: Gordy HERO Starch, MD  Dr. Starch - Ms. Heskett had coronary CTA showing stable mild-moderate CAD. FFR is reassuring against any hemodynamically significant stenosis. She has an incidental PFO but no hx of TIA or stroke so no intervention needed for this at this time. OK for EGD/colonoscopy as previously planned from my standpoint. Thanks!

## 2023-09-24 ENCOUNTER — Ambulatory Visit (AMBULATORY_SURGERY_CENTER): Payer: Medicare Other | Admitting: *Deleted

## 2023-09-24 ENCOUNTER — Encounter: Payer: Self-pay | Admitting: Internal Medicine

## 2023-09-24 ENCOUNTER — Ambulatory Visit (INDEPENDENT_AMBULATORY_CARE_PROVIDER_SITE_OTHER): Payer: Medicare Other | Admitting: Internal Medicine

## 2023-09-24 ENCOUNTER — Telehealth: Payer: Self-pay

## 2023-09-24 VITALS — BP 124/72 | HR 75 | Temp 97.9°F | Ht 62.5 in | Wt 162.0 lb

## 2023-09-24 VITALS — Ht 62.5 in | Wt 162.0 lb

## 2023-09-24 DIAGNOSIS — R918 Other nonspecific abnormal finding of lung field: Secondary | ICD-10-CM

## 2023-09-24 DIAGNOSIS — Z800 Family history of malignant neoplasm of digestive organs: Secondary | ICD-10-CM

## 2023-09-24 DIAGNOSIS — L659 Nonscarring hair loss, unspecified: Secondary | ICD-10-CM | POA: Diagnosis not present

## 2023-09-24 DIAGNOSIS — K219 Gastro-esophageal reflux disease without esophagitis: Secondary | ICD-10-CM

## 2023-09-24 DIAGNOSIS — R1310 Dysphagia, unspecified: Secondary | ICD-10-CM

## 2023-09-24 DIAGNOSIS — R112 Nausea with vomiting, unspecified: Secondary | ICD-10-CM

## 2023-09-24 DIAGNOSIS — R9389 Abnormal findings on diagnostic imaging of other specified body structures: Secondary | ICD-10-CM

## 2023-09-24 DIAGNOSIS — Z860100 Personal history of colon polyps, unspecified: Secondary | ICD-10-CM

## 2023-09-24 MED ORDER — SUFLAVE 178.7 G PO SOLR: 1.0000 | Freq: Once | ORAL | 0 refills | Status: AC

## 2023-09-24 MED ORDER — SPIRONOLACTONE 25 MG PO TABS: 12.5000 mg | ORAL_TABLET | Freq: Every day | ORAL | 1 refills | Status: DC

## 2023-09-24 MED ORDER — ONDANSETRON 4 MG PO TBDP: 4.0000 mg | ORAL_TABLET | Freq: Three times a day (TID) | ORAL | 0 refills | Status: DC | PRN

## 2023-09-24 NOTE — Progress Notes (Signed)
 Subjective:    Patient ID: [name], female    DOB: September 29, 1945, 78 y.o.   MRN: [id number]      HPI [name] is here for  Chief Complaint  Patient presents with   Medical Management of Chronic Issues    Pt had scan of her heart done and she is here to discuss scan     CT CAC done recently and her CAC score was 256.  Had some abnormal findings on CT chest-hepatic steatosis, thickening in the lower end of the esophagus, and some interstitial lung changes.  He is here to discuss them further.  Continues to have hair loss which is disturbing  Continues to have significant fatigue which is disturbing.   She is experiencing nausea and would like a refill of Zofran.   Medications and allergies reviewed with patient and updated if appropriate.  Current Outpatient Medications on File Prior to Visit  Medication Sig Dispense Refill   acetaminophen (TYLENOL) 500 MG tablet Take 2 tablets (1,000 mg total) by mouth 3 (three) times daily as needed.     amLODipine (NORVASC) 5 MG tablet Take 1 tablet (5 mg total) by mouth daily. 90 tablet 3   Ascorbic Acid (VITAMIN C) 1000 MG tablet Take 1 tablet (1,000 mg total) by mouth 2 (two) times daily.     aspirin 81 MG tablet Take 81 mg by mouth daily.     carvedilol (COREG) 3.125 MG tablet TAKE 1 TABLET BY MOUTH TWICE A DAY 180 tablet 3   Cholecalciferol (VITAMIN D-3) 25 MCG (1000 UT) CAPS Take 10,000 Units by mouth daily.      diclofenac Sodium (VOLTAREN) 1 % GEL Apply topically 4 (four) times daily as needed.      EPINEPHrine (EPIPEN 2-PAK) 0.3 mg/0.3 mL IJ SOAJ injection Use as directed for severe allergic reactions 2 each 1   MAGNESIUM GLYCINATE PO Take 240 mg by mouth daily.     naloxone (NARCAN) 4 MG/0.1ML LIQD nasal spray kit      Omega-3 Fatty Acids (FISH OIL BURP-LESS) 1000 MG CAPS Takes 1280 mg 2 caps daiily 60 capsule 0   rosuvastatin (CRESTOR) 5 MG tablet Take 1 tablet (5 mg total) by mouth daily. (Patient taking differently: Take 5  mg by mouth every evening.) 30 tablet 11   Ubiquinol 100 MG CAPS Take 1 tablet by mouth daily at 6 (six) AM.     vitamin B-12 (CYANOCOBALAMIN) 1000 MCG tablet Take 1,000 mcg by mouth 2 (two) times daily.      vitamin E 400 UNIT capsule Take 400 Units by mouth daily.     zinc gluconate 50 MG tablet Take 50 mg by mouth daily.     No current facility-administered medications on file prior to visit.    Review of Systems     Objective:   Vitals:   09/24/23 0809  BP: (!) 140/70  Pulse: 75  Temp: 97.9 F (36.6 C)  SpO2: 97%   BP Readings from Last 3 Encounters:  09/24/23 (!) 140/70  09/05/23 (!) 104/56  08/31/23 (!) 140/86   Wt Readings from Last 3 Encounters:  09/24/23 162 lb (73.5 kg)  08/31/23 159 lb (72.1 kg)  08/15/23 160 lb (72.6 kg)   Body mass index is 29.16 kg/m.    Physical Exam Constitutional:      General: She is not in acute distress.    Appearance: Normal appearance. She is not ill-appearing.  Skin:    General: Skin is  warm and dry.     Comments: Large part on top of head where there has been some obvious hair loss. Evidence of new hair growth. No scalp redness  Neurological:     Mental Status: She is alert.            Assessment & Plan:    See Problem List for Assessment and Plan of chronic medical problems.

## 2023-09-24 NOTE — Assessment & Plan Note (Addendum)
 Stopped omeprazole a while ago Has GERD symptoms < 1 / week Occ - rare dysphagia with fluid Thickening of esophagus seen on Ct CAC test I think she likely has silent GERD to some degree No change in medication today since she is to see Dr Rhea Belton later today EGD scheduled Will renew Zofran for nausea which is likely related to reflux

## 2023-09-24 NOTE — Assessment & Plan Note (Signed)
 Subacute Continues to lose hair There is increased stress which could be contributing No anemia, vitamin B12 level is normal, taking multiple supplements Will refer to dermatology but specializes in hair loss Start spironolactone 12.5 mg daily BMP, TFTs in 2 weeks Monitor BP at home

## 2023-09-24 NOTE — Progress Notes (Addendum)
 Pt's name and DOB verified at the beginning of the pre-visit wit 2 identifiers  Permission given to speak with daughter if needed  Pt denies any difficulty with ambulating,sitting, laying down or rolling side to side  Pt has no issues with ambulation   Pt has no issues moving head neck or swallowing  No egg or soy allergy known to patient   No issues known to pt with past sedation with any surgeries or procedures  Pt denies having issues being intubated  No FH of Malignant Hyperthermia  Pt is not on diet pills or shots  Pt is not on home 02   Pt is not on blood thinners   Pt denies issues with constipation   Pt is not on dialysis  Pt denise any abnormal heart rhythms   Pt denies any upcoming cardiac testing  Patient's chart reviewed by Cathlyn Parsons CNRA prior to pre-visit and patient appropriate for the [facility].  Pre-visit completed and red dot placed by patient's name on their procedure day (on provider's schedule).    Visit by phone  Visit in person   Pt Scale weight is 162 lb  IInstructions reviewed. Pt given [facility], [facility] main # and MD on call # prior to instructions.  Pt states understanding of instructions. Instructed to review again prior to procedure. Pt states they will.   Informed pt that they will receive a text or  call from [facility] regarding there prep med.  Instructions given to pt with [facility]

## 2023-09-24 NOTE — Telephone Encounter (Signed)
-----   Message from Laurann Montana sent at 09/20/2023  7:45 AM EST ----- Please place referral to pulmonology for abnormal chest CT, reticulation of the lungs. I sent mychart msg with results to patient already and let her know we were placing this.

## 2023-09-24 NOTE — Assessment & Plan Note (Signed)
 CT scan of the lungs showed some interstitial changes ?  Scarring versus interstitial lung disease Will refer to pulmonary for further evaluation

## 2023-09-24 NOTE — Telephone Encounter (Signed)
 Referral has already been placed by patient's PCP.

## 2023-09-24 NOTE — Patient Instructions (Addendum)
     Have blood work in two weeks   Medications changes include :   zofran as needed for nausea, spironolactone 12.5 mg daily

## 2023-10-08 ENCOUNTER — Ambulatory Visit: Payer: Medicare Other | Admitting: Internal Medicine

## 2023-10-08 ENCOUNTER — Encounter: Payer: Self-pay | Admitting: Internal Medicine

## 2023-10-08 VITALS — BP 123/66 | HR 56 | Temp 98.4°F | Resp 17 | Ht 62.5 in | Wt 162.0 lb

## 2023-10-08 DIAGNOSIS — Z8719 Personal history of other diseases of the digestive system: Secondary | ICD-10-CM

## 2023-10-08 DIAGNOSIS — Z1211 Encounter for screening for malignant neoplasm of colon: Secondary | ICD-10-CM | POA: Diagnosis not present

## 2023-10-08 DIAGNOSIS — D123 Benign neoplasm of transverse colon: Secondary | ICD-10-CM | POA: Diagnosis not present

## 2023-10-08 DIAGNOSIS — Z860101 Personal history of adenomatous and serrated colon polyps: Secondary | ICD-10-CM

## 2023-10-08 DIAGNOSIS — Z860100 Personal history of colon polyps, unspecified: Secondary | ICD-10-CM | POA: Diagnosis not present

## 2023-10-08 DIAGNOSIS — K2950 Unspecified chronic gastritis without bleeding: Secondary | ICD-10-CM

## 2023-10-08 DIAGNOSIS — R1310 Dysphagia, unspecified: Secondary | ICD-10-CM | POA: Diagnosis not present

## 2023-10-08 DIAGNOSIS — Z800 Family history of malignant neoplasm of digestive organs: Secondary | ICD-10-CM

## 2023-10-08 DIAGNOSIS — E039 Hypothyroidism, unspecified: Secondary | ICD-10-CM | POA: Diagnosis not present

## 2023-10-08 DIAGNOSIS — K209 Esophagitis, unspecified without bleeding: Secondary | ICD-10-CM | POA: Diagnosis not present

## 2023-10-08 DIAGNOSIS — K31A11 Gastric intestinal metaplasia without dysplasia, involving the antrum: Secondary | ICD-10-CM

## 2023-10-08 DIAGNOSIS — D125 Benign neoplasm of sigmoid colon: Secondary | ICD-10-CM

## 2023-10-08 DIAGNOSIS — K5730 Diverticulosis of large intestine without perforation or abscess without bleeding: Secondary | ICD-10-CM | POA: Diagnosis not present

## 2023-10-08 DIAGNOSIS — K2970 Gastritis, unspecified, without bleeding: Secondary | ICD-10-CM | POA: Diagnosis not present

## 2023-10-08 DIAGNOSIS — F419 Anxiety disorder, unspecified: Secondary | ICD-10-CM | POA: Diagnosis not present

## 2023-10-08 DIAGNOSIS — I2510 Atherosclerotic heart disease of native coronary artery without angina pectoris: Secondary | ICD-10-CM | POA: Diagnosis not present

## 2023-10-08 DIAGNOSIS — K2980 Duodenitis without bleeding: Secondary | ICD-10-CM

## 2023-10-08 DIAGNOSIS — E119 Type 2 diabetes mellitus without complications: Secondary | ICD-10-CM | POA: Diagnosis not present

## 2023-10-08 HISTORY — PX: COLONOSCOPY WITH ESOPHAGOGASTRODUODENOSCOPY (EGD): SHX5779

## 2023-10-08 MED ORDER — SODIUM CHLORIDE 0.9 % IV SOLN: 500.0000 mL | Freq: Once | INTRAVENOUS | Status: DC

## 2023-10-08 NOTE — Progress Notes (Signed)
 To pacu, VSS. Report to [email or web address]

## 2023-10-08 NOTE — Op Note (Signed)
 [facility] Patient Name: [name] Procedure Date: 10/08/2023 8:41 AM MRN: [id number] Endoscopist: Beverley Fiedler , MD, [id number] Age: 78 Referring MD:  Date of Birth: August 05, 1945 Gender: Female Account #: [id number] Procedure:                Upper GI endoscopy Indications:              Dysphagia, history of gastro-esophageal reflux                            disease, Nausea, distal esophageal thickening seen                            on CT chest in Feb 2025 Medicines:                Monitored Anesthesia Care Procedure:                Pre-Anesthesia Assessment:                           - Prior to the procedure, a History and Physical                            was performed, and patient medications and                            allergies were reviewed. The patient's tolerance of                            previous anesthesia was also reviewed. The risks                            and benefits of the procedure and the sedation                            options and risks were discussed with the patient.                            All questions were answered, and informed consent                            was obtained. Prior Anticoagulants: The patient has                            taken no anticoagulant or antiplatelet agents. ASA                            Grade Assessment: II - A patient with mild systemic                            disease. After reviewing the risks and benefits,                            the patient was deemed in satisfactory condition to  undergo the procedure.                           After obtaining informed consent, the endoscope was                            passed under direct vision. Throughout the                            procedure, the patient's blood pressure, pulse, and                            oxygen saturations were monitored continuously. The                            Olympus scope [id number] was  introduced through the                            mouth, and advanced to the second part of duodenum.                            The upper GI endoscopy was accomplished without                            difficulty. The patient tolerated the procedure                            well. Scope In: Scope Out: Findings:                 LA Grade A (one or more mucosal breaks less than 5                            mm, not extending between tops of 2 mucosal folds)                            esophagitis with no bleeding was found at the                            gastroesophageal junction.                           No endoscopic abnormality was evident in the                            esophagus to explain the patient's complaint of                            dysphagia. It was decided, however, to proceed with                            dilation of the entire esophagus. The scope was                            withdrawn. Dilation  was performed with a Maloney                            dilator with mild resistance at 52 Fr.                           Scattered mild inflammation characterized by                            erythema and granularity was found in the gastric                            antrum. Biopsies were taken with a cold forceps for                            Helicobacter pylori testing.                           Mild inflammation characterized by erosions and                            erythema was found in the duodenal bulb.                           The second portion of the duodenum was normal. Complications:            No immediate complications. Estimated Blood Loss:     Estimated blood loss was minimal. Impression:               - LA Grade A reflux esophagitis with no bleeding.                           - No endoscopic esophageal abnormality to explain                            patient's dysphagia. Esophagus dilated with 52 Fr                            Maloney.                            - Gastritis. Biopsied.                           - Duodenitis.                           - Normal second portion of the duodenum. Recommendation:           - Patient has a contact number available for                            emergencies. The signs and symptoms of potential                            delayed complications were discussed with the  patient. Return to normal activities tomorrow.                            Written discharge instructions were provided to the                            patient.                           - Post-dilation diet and then advance diet as                            tolerated.                           - Continue present medications. Consider resuming                            Nexium, but await biopsy results.                           - Await pathology results. Beverley Fiedler, MD 10/08/2023 9:27:45 AM This report has been signed electronically.

## 2023-10-08 NOTE — Patient Instructions (Signed)
 Educational handout provided to patient related to Hemorrhoids, Polyps, and Diverticulosis, Gastritis, post dilation diet  POST DILATION DIET  Continue present medications  Awaiting pathology results   YOU HAD AN ENDOSCOPIC PROCEDURE TODAY AT THE [facility]:   Refer to the procedure report that was given to you for any specific questions about what was found during the examination.  If the procedure report does not answer your questions, please call your gastroenterologist to clarify.  If you requested that your care partner not be given the details of your procedure findings, then the procedure report has been included in a sealed envelope for you to review at your convenience later.  YOU SHOULD EXPECT: Some feelings of bloating in the abdomen. Passage of more gas than usual.  Walking can help get rid of the air that was put into your GI tract during the procedure and reduce the bloating. If you had a lower endoscopy (such as a colonoscopy or flexible sigmoidoscopy) you may notice spotting of blood in your stool or on the toilet paper. If you underwent a bowel prep for your procedure, you may not have a normal bowel movement for a few days.  Please Note:  You might notice some irritation and congestion in your nose or some drainage.  This is from the oxygen used during your procedure.  There is no need for concern and it should clear up in a day or so.  SYMPTOMS TO REPORT IMMEDIATELY:  Following lower endoscopy (colonoscopy or flexible sigmoidoscopy):  Excessive amounts of blood in the stool  Significant tenderness or worsening of abdominal pains  Swelling of the abdomen that is new, acute  Fever of 100F or higher  Following upper endoscopy (EGD)  Vomiting of blood or coffee ground material  New chest pain or pain under the shoulder blades  Painful or persistently difficult swallowing  New shortness of breath  Fever of 100F or higher  Black, tarry-looking stools  For  urgent or emergent issues, a gastroenterologist can be reached at any hour by calling [phone]. Do not use MyChart messaging for urgent concerns.    DIET:  We do recommend a small meal at first, but then you may proceed to your regular diet.  Drink plenty of fluids but you should avoid alcoholic beverages for 24 hours.  ACTIVITY:  You should plan to take it easy for the rest of today and you should NOT DRIVE or use heavy machinery until tomorrow (because of the sedation medicines used during the test).    FOLLOW UP: Our staff will call the number listed on your records the next business day following your procedure.  We will call around 7:15- 8:00 am to check on you and address any questions or concerns that you may have regarding the information given to you following your procedure. If we do not reach you, we will leave a message.     If any biopsies were taken you will be contacted by phone or by letter within the next 1-3 weeks.  Please call us at [phone] if you have not heard about the biopsies in 3 weeks.    SIGNATURES/CONFIDENTIALITY: You and/or your care partner have signed paperwork which will be entered into your electronic medical record.  These signatures attest to the fact that that the information above on your After Visit Summary has been reviewed and is understood.  Full responsibility of the confidentiality of this discharge information lies with you and/or your care-partner.

## 2023-10-08 NOTE — Progress Notes (Signed)
 Called to room to assist during endoscopic procedure.  Patient ID and intended procedure confirmed with present staff. Received instructions for my participation in the procedure from the performing physician.

## 2023-10-08 NOTE — Progress Notes (Signed)
 Pt's states no medical or surgical changes since previsit or office visit.

## 2023-10-08 NOTE — Op Note (Signed)
 [facility] Patient Name: [name] Procedure Date: 10/08/2023 8:36 AM MRN: [id number] Endoscopist: Beverley Fiedler , MD, [id number] Age: 78 Referring MD:  Date of Birth: February 21, 1946 Gender: Female Account #: [id number] Procedure:                Colonoscopy Indications:              High risk colon cancer surveillance: Personal                            history of multiple adenomas, Family history of                            colon cancer in a first-degree relative (sister),                            Last colonoscopy: June 2019 (TA x 2), June 2017 (TA                            x 7), Dec 2014 (TA x 6) Medicines:                Monitored Anesthesia Care Procedure:                Pre-Anesthesia Assessment:                           - Prior to the procedure, a History and Physical                            was performed, and patient medications and                            allergies were reviewed. The patient's tolerance of                            previous anesthesia was also reviewed. The risks                            and benefits of the procedure and the sedation                            options and risks were discussed with the patient.                            All questions were answered, and informed consent                            was obtained. Prior Anticoagulants: The patient has                            taken no anticoagulant or antiplatelet agents. ASA                            Grade Assessment: II - A patient with mild systemic  disease. After reviewing the risks and benefits,                            the patient was deemed in satisfactory condition to                            undergo the procedure.                           After obtaining informed consent, the colonoscope                            was passed under direct vision. Throughout the                            procedure, the patient's blood pressure,  pulse, and                            oxygen saturations were monitored continuously. The                            [id number]-HQ190L Colonoscope [id number] was introduced                            through the anus and advanced to the cecum,                            identified by appendiceal orifice and ileocecal                            valve. The colonoscopy was performed without                            difficulty. The patient tolerated the procedure                            well. The quality of the bowel preparation was                            good. The ileocecal valve, appendiceal orifice, and                            rectum were photographed. Scope In: 8:58:14 AM Scope Out: 9:20:58 AM Scope Withdrawal Time: 0 hours 15 minutes 59 seconds  Total Procedure Duration: 0 hours 22 minutes 44 seconds  Findings:                 The digital rectal exam was normal.                           Three sessile polyps were found in the transverse                            colon. The polyps were 3 to 5 mm in size. These  polyps were removed with a cold snare. Resection                            and retrieval were complete.                           A 4 mm polyp was found in the sigmoid colon. The                            polyp was sessile. The polyp was removed with a                            cold snare. Resection was complete, but the polyp                            tissue was not retrieved.                           Multiple medium-mouthed and small-mouthed                            diverticula were found in the sigmoid colon and                            distal descending colon.                           Retroflexion in the rectum was not performed due to                            narrow rectal vault. No distal rectal or anorectal                            pathology. Complications:            No immediate complications. Estimated Blood Loss:     Estimated  blood loss was minimal. Impression:               - Three 3 to 5 mm polyps in the transverse colon,                            removed with a cold snare. Resected and retrieved.                           - One 4 mm polyp in the sigmoid colon, removed with                            a cold snare. Complete resection. Polyp tissue not                            retrieved.                           - Moderate diverticulosis in the sigmoid colon and  in the distal descending colon. Recommendation:           - Patient has a contact number available for                            emergencies. The signs and symptoms of potential                            delayed complications were discussed with the                            patient. Return to normal activities tomorrow.                            Written discharge instructions were provided to the                            patient.                           - Resume previous diet.                           - Continue present medications.                           - Await pathology results.                           - No recommendation at this time regarding repeat                            colonoscopy due to age. Beverley Fiedler, MD 10/08/2023 9:31:19 AM This report has been signed electronically.

## 2023-10-08 NOTE — Progress Notes (Signed)
 GASTROENTEROLOGY PROCEDURE H&P NOTE   Primary Care Physician: Pincus Sanes, MD    Reason for Procedure:  Dysphagia symptom, history of GERD, nausea and colonic polyps  Plan:    Upper and lower endoscopy  Patient is appropriate for endoscopic procedure(s) in the ambulatory ([facility]) setting.  The nature of the procedure, as well as the risks, benefits, and alternatives were carefully and thoroughly reviewed with the patient. Ample time for discussion and questions allowed. The patient understood, was satisfied, and agreed to proceed.     HPI: [name] is a 78 y.o. female who presents for EGD and colonoscopy.  Medical history as below.  Tolerated the prep.  No recent chest pain or shortness of breath.  No abdominal pain today.  Past Medical History:  Diagnosis Date   Allergy    seasonal   Ankylosing spondylitis (HCC)    Anxiety    Arthritis    Bell palsy    yrs ago   Blood transfusion without reported diagnosis    CAD (coronary artery disease)    a. Coronary CT 10/2016 showed calcium score in the 59th perecntile, moderate CAD in RCA/PDA and narrowing of the mid to distal LAD, with negative FFR.   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    GERD (gastroesophageal reflux disease)    history of CTS (carpal tunnel syndrome)    History of kidney stones    HLA B27 (HLA B27 positive)    Hx of colonic polyps    Hyperlipidemia    NMR 2005   Hypertension    Hypothyroidism    Incontinence    Internal hemorrhoids    Interstitial cystitis    Kidney stone    Menopause    Mild aortic insufficiency    Mild ascending aorta dilatation (HCC)    followed by cardiology  dayna dunn pa   Osteoarthritis    shoulder, ankles, hands   Osteopenia    [facility] [address]   Osteopenia    Pneumonia    2 x as  child   Pre-diabetes    PSVT (paroxysmal supraventricular tachycardia) (HCC)    S/P total hysterectomy and bilateral salpingo-oophorectomy 04/12/2014   Sinusitis    Sleep apnea    c-pap   Subjective visual disturbance of both eyes 12/30/2013   Tubular adenoma of colon    Vitamin D deficiency    Xerostomia    and Xeroophthalmia(oral dryness)    Past Surgical History:  Procedure Laterality Date   BLADDER SURGERY     for incontinence   CARPAL TUNNEL RELEASE Left 12/22/2014   Procedure: LEFT CARPAL TUNNEL RELEASE;  Surgeon: Cindee Salt, MD;  Location: [facility];  Service: Orthopedics;  Laterality: Left;   CARPAL TUNNEL RELEASE Right 02/18/2019   Procedure: RIGHT CARPAL TUNNEL RELEASE;  Surgeon: Cindee Salt, MD;  Location: [facility];  Service: Orthopedics;  Laterality: Right;   cataract excision Bilateral 2022   COLONOSCOPY W/ POLYPECTOMY     adenomatous poyp 04-2008   colonscopy     tics 10-2002   CYSTOSCOPY WITH RETROGRADE PYELOGRAM, URETEROSCOPY AND STENT PLACEMENT Left 09/01/2013   Procedure: CYSTOSCOPY WITH RETROGRADE PYELOGRAM, AND LEFT STENT PLACEMENT;  Surgeon: Milford Cage, MD;  Location: [facility];  Service: Urology;  Laterality: Left;   EXCISION OF KELOID N/A 03/09/2023   Procedure: EXCISIONIONAL BIOPSY SEBACEOUS CYST LEFT BUTTOCK;  Surgeon: Romie Levee, MD;  Location: [facility] [place];  Service: General;  Laterality: N/A;   FINGER SURGERY Left 2004   2nd finger   G 1 P 1     interstitial cystitis     with clot in catheter   SHOULDER ARTHROSCOPY Left    TOTAL ABDOMINAL HYSTERECTOMY W/ BILATERAL SALPINGOOPHORECTOMY     dysfunctional menses    Prior to Admission medications   Medication Sig Start Date End Date Taking? Authorizing Provider  amLODipine (NORVASC) 5 MG tablet Take 1 tablet (5 mg total) by mouth daily. 12/08/22 12/03/23 Yes Dunn, Dayna N, PA-C  aspirin 81 MG tablet Take 81 mg by mouth daily.   Yes [provider]  Cholecalciferol (VITAMIN D-3) 25 MCG (1000 UT) CAPS Take 10,000 Units by mouth daily.    Yes [provider]  diclofenac Sodium (VOLTAREN) 1 % GEL Apply topically 4 (four) times daily as needed.    Yes [provider]  MAGNESIUM GLYCINATE PO Take 240 mg by mouth daily.   Yes [provider]  Omega-3 Fatty Acids (FISH OIL BURP-LESS) 1000 MG CAPS Takes 1280 mg 2 caps daiily 01/03/16  Yes Esterwood, Amy S, PA-C  ondansetron (ZOFRAN-ODT) 4 MG disintegrating tablet Take 1 tablet (4 mg total) by mouth every 8 (eight) hours as needed for nausea or vomiting. 09/24/23  Yes Burns, Bobette Mo, MD  rosuvastatin (CRESTOR) 5 MG tablet Take 1 tablet (5 mg total) by mouth daily. Patient taking differently: Take 5 mg by mouth every evening. 20.5 mg 08/28/22  Yes Meriam Sprague, MD  vitamin B-12 (CYANOCOBALAMIN) 1000 MCG tablet Take 1,000 mcg by mouth 2 (two) times daily.    Yes [provider]  vitamin E 400 UNIT capsule Take 400 Units by mouth daily.   Yes [provider]  zinc gluconate 50 MG tablet Take 50 mg by mouth daily.   Yes [provider]  acetaminophen (TYLENOL) 500 MG tablet Take 2 tablets (1,000 mg total) by mouth 3 (three) times daily as needed. 03/07/23   Pincus Sanes, MD  EPINEPHrine (EPIPEN 2-PAK) 0.3 mg/0.3 mL IJ SOAJ injection Use as directed for severe allergic reactions Patient not taking: Reported on 10/08/2023 03/26/23   Gwenlyn Fudge, FNP  spironolactone (ALDACTONE) 25 MG tablet Take 0.5 tablets (12.5 mg total) by mouth daily. 09/24/23   Pincus Sanes, MD  Ubiquinol 100 MG CAPS Take 1 tablet by mouth daily at 6 (six) AM.    [provider]    Current Outpatient Medications  Medication Sig Dispense Refill   amLODipine (NORVASC) 5 MG tablet Take 1 tablet (5 mg total) by mouth daily. 90 tablet 3   aspirin 81 MG tablet Take 81 mg by mouth daily.     Cholecalciferol (VITAMIN D-3) 25 MCG (1000 UT) CAPS Take 10,000  Units by mouth daily.      diclofenac Sodium (VOLTAREN) 1 % GEL Apply topically 4 (four) times daily as needed.      MAGNESIUM GLYCINATE PO Take 240 mg by mouth daily.     Omega-3 Fatty Acids (FISH OIL BURP-LESS) 1000 MG CAPS Takes 1280 mg 2 caps daiily 60 capsule 0   ondansetron (ZOFRAN-ODT) 4 MG disintegrating tablet Take 1 tablet (4 mg total) by mouth every 8 (eight) hours as needed for nausea or vomiting. 20 tablet 0  rosuvastatin (CRESTOR) 5 MG tablet Take 1 tablet (5 mg total) by mouth daily. (Patient taking differently: Take 5 mg by mouth every evening. 20.5 mg) 30 tablet 11   vitamin B-12 (CYANOCOBALAMIN) 1000 MCG tablet Take 1,000 mcg by mouth 2 (two) times daily.      vitamin E 400 UNIT capsule Take 400 Units by mouth daily.     zinc gluconate 50 MG tablet Take 50 mg by mouth daily.     acetaminophen (TYLENOL) 500 MG tablet Take 2 tablets (1,000 mg total) by mouth 3 (three) times daily as needed.     EPINEPHrine (EPIPEN 2-PAK) 0.3 mg/0.3 mL IJ SOAJ injection Use as directed for severe allergic reactions (Patient not taking: Reported on 10/08/2023) 2 each 1   spironolactone (ALDACTONE) 25 MG tablet Take 0.5 tablets (12.5 mg total) by mouth daily. 45 tablet 1   Ubiquinol 100 MG CAPS Take 1 tablet by mouth daily at 6 (six) AM.     Current Facility-Administered Medications  Medication Dose Route Frequency Provider Last Rate Last Admin   0.9 %  sodium chloride infusion  500 mL Intravenous Once Moksha Dorgan, Carie Caddy, MD        Allergies as of 10/08/2023 - Review Complete 10/08/2023  Allergen Reaction Noted   Bactrim [sulfamethoxazole-trimethoprim] Other (See Comments) 04/12/2014   Pentosan polysulfate Other (See Comments) 04/12/2011   Pentosan polysulfate sodium  04/12/2011   Promethazine hcl Other (See Comments)    Sulfamethoxazole-trimethoprim Nausea Only 09/12/2016   Lisinopril Cough    Losartan Other (See Comments) 10/02/2019   Pork (diagnostic) Nausea Only 10/08/2023   Promethazine hcl  Anxiety 09/24/2019    Family History  Problem Relation Age of Onset   Coronary artery disease Mother    Hypertension Mother    Asthma Mother    Dementia Mother    Stroke Mother        mini cva   Diabetes Father    Cirrhosis Father        non alcoholic   Colon cancer Sister 38   Allergic rhinitis Sister    Allergic rhinitis Sister    Food Allergy Sister    Breast cancer Maternal Aunt    Renal cancer Paternal Aunt        x 2   Diabetes Paternal Aunt    Coronary artery disease Maternal Grandfather    Diabetes Paternal Grandmother    Esophageal cancer Neg Hx    Stomach cancer Neg Hx    Colon polyps Neg Hx     Social History   Socioeconomic History   Marital status: Married    Spouse name: Joe   Number of children: 1   Years of education: BS   Highest education level: Not on file  Occupational History   Occupation: retired   Occupation: Runner, broadcasting/film/video    Comment: Kindergarten  Tobacco Use   Smoking status: Never   Smokeless tobacco: Never  Vaping Use   Vaping status: Never Used  Substance and Sexual Activity   Alcohol use: Yes    Comment: rare   Drug use: No   Sexual activity: Not Currently  Other Topics Concern   Not on file  Social History Narrative   Lives at home with husband, is his caregiver   No daily use of caffeine.   Right-handed.         Social Drivers of Health   Financial Resource Strain: Low Risk  (08/31/2023)   Overall Financial Resource Strain (CARDIA)    Difficulty  of Paying Living Expenses: Not hard at all  Food Insecurity: No Food Insecurity (08/31/2023)   Hunger Vital Sign    Worried About Running Out of Food in the Last Year: Never true    Ran Out of Food in the Last Year: Never true  Transportation Needs: No Transportation Needs (08/31/2023)   PRAPARE - Administrator, Civil Service (Medical): No    Lack of Transportation (Non-Medical): No  Physical Activity: Insufficiently Active (08/31/2023)   Exercise Vital Sign    Days of  Exercise per Week: 2 days    Minutes of Exercise per Session: 20 min  Stress: Stress Concern Present (08/31/2023)   [employer] of Occupational Health - Occupational Stress Questionnaire    Feeling of Stress : To some extent  Social Connections: Moderately Isolated (08/31/2023)   Social Connection and Isolation Panel [NHANES]    Frequency of Communication with Friends and Family: More than three times a week    Frequency of Social Gatherings with Friends and Family: Never    Attends Religious Services: Never    Database administrator or Organizations: No    Attends Banker Meetings: Never    Marital Status: Married  Catering manager Violence: Not At Risk (08/31/2023)   Humiliation, Afraid, Rape, and Kick questionnaire    Fear of Current or Ex-Partner: No    Emotionally Abused: No    Physically Abused: No    Sexually Abused: No    Physical Exam: Vital signs in last 24 hours: @BP  126/76   Pulse 62   Temp 98.4 F (36.9 C) (Skin)   Ht 5' 2.5" (1.588 m)   Wt 162 lb (73.5 kg)   SpO2 95%   BMI 29.16 kg/m  GEN: NAD EYE: Sclerae anicteric ENT: MMM CV: Non-tachycardic Pulm: CTA b/l GI: Soft, NT/ND NEURO:  Alert & Oriented x 3   Erick Blinks, MD [facility]  10/08/2023 8:38 AM

## 2023-10-09 ENCOUNTER — Telehealth: Payer: Self-pay

## 2023-10-09 NOTE — Telephone Encounter (Signed)
  Follow up Call-     10/08/2023    7:31 AM  Call back number  Post procedure Call Back phone  # [phone]  Permission to leave phone message Yes     Patient questions:  Do you have a fever, pain , or abdominal swelling? No. Pain Score  0 *  Have you tolerated food without any problems? Yes.    Have you been able to return to your normal activities? Yes.    Do you have any questions about your discharge instructions: Diet   No. Medications  No. Follow up visit  No.  Do you have questions or concerns about your Care? No.  Actions: * If pain score is 4 or above: No action needed, pain <4.

## 2023-10-10 LAB — SURGICAL PATHOLOGY

## 2023-10-13 ENCOUNTER — Other Ambulatory Visit: Payer: Self-pay | Admitting: Physician Assistant

## 2023-10-15 ENCOUNTER — Encounter: Payer: Self-pay | Admitting: Internal Medicine

## 2023-10-20 ENCOUNTER — Other Ambulatory Visit: Payer: Self-pay | Admitting: Internal Medicine

## 2023-10-30 ENCOUNTER — Telehealth: Payer: Self-pay | Admitting: Internal Medicine

## 2023-10-30 NOTE — Telephone Encounter (Signed)
 Pt called and states she had colon done last week. States Sunday she started having pain in her LLQ. This am when she got up it was hard to walk standing up straight. Had a BM and it was loose, reports she could not push due to pain in the LLQ. Pt scheduled to see Quentin Mulling PA 10/31/23 @10 :40am. Pt aware of appt.

## 2023-10-30 NOTE — Telephone Encounter (Signed)
 Home or cell would be fine.

## 2023-10-30 NOTE — Telephone Encounter (Signed)
 Do you have a preferred contact number for the pt?

## 2023-10-30 NOTE — Telephone Encounter (Signed)
 Pt had her procedure done about 2 weeks ago. Pt was told to come back if she is experiencing any  pain.... pt is currently experiencing extreme lower abdominal pain please advise.

## 2023-10-31 ENCOUNTER — Ambulatory Visit (INDEPENDENT_AMBULATORY_CARE_PROVIDER_SITE_OTHER): Admitting: Physician Assistant

## 2023-10-31 ENCOUNTER — Encounter: Payer: Self-pay | Admitting: Physician Assistant

## 2023-10-31 ENCOUNTER — Ambulatory Visit (INDEPENDENT_AMBULATORY_CARE_PROVIDER_SITE_OTHER)

## 2023-10-31 ENCOUNTER — Other Ambulatory Visit (INDEPENDENT_AMBULATORY_CARE_PROVIDER_SITE_OTHER)

## 2023-10-31 VITALS — BP 120/70 | HR 65 | Ht 62.5 in | Wt 165.8 lb

## 2023-10-31 DIAGNOSIS — K760 Fatty (change of) liver, not elsewhere classified: Secondary | ICD-10-CM

## 2023-10-31 DIAGNOSIS — R079 Chest pain, unspecified: Secondary | ICD-10-CM

## 2023-10-31 DIAGNOSIS — R197 Diarrhea, unspecified: Secondary | ICD-10-CM | POA: Diagnosis not present

## 2023-10-31 DIAGNOSIS — R6883 Chills (without fever): Secondary | ICD-10-CM

## 2023-10-31 DIAGNOSIS — K219 Gastro-esophageal reflux disease without esophagitis: Secondary | ICD-10-CM | POA: Diagnosis not present

## 2023-10-31 DIAGNOSIS — R1032 Left lower quadrant pain: Secondary | ICD-10-CM

## 2023-10-31 DIAGNOSIS — D649 Anemia, unspecified: Secondary | ICD-10-CM

## 2023-10-31 LAB — CBC WITH DIFFERENTIAL/PLATELET
Basophils Absolute: 0.1 10*3/uL (ref 0.0–0.1)
Basophils Relative: 0.9 % (ref 0.0–3.0)
Eosinophils Absolute: 0.2 10*3/uL (ref 0.0–0.7)
Eosinophils Relative: 4.2 % (ref 0.0–5.0)
HCT: 34.8 % — ABNORMAL LOW (ref 36.0–46.0)
Hemoglobin: 11.6 g/dL — ABNORMAL LOW (ref 12.0–15.0)
Lymphocytes Relative: 28.5 % (ref 12.0–46.0)
Lymphs Abs: 1.7 10*3/uL (ref 0.7–4.0)
MCHC: 33.2 g/dL (ref 30.0–36.0)
MCV: 88.3 fl (ref 78.0–100.0)
Monocytes Absolute: 0.6 10*3/uL (ref 0.1–1.0)
Monocytes Relative: 10.6 % (ref 3.0–12.0)
Neutro Abs: 3.3 10*3/uL (ref 1.4–7.7)
Neutrophils Relative %: 55.8 % (ref 43.0–77.0)
Platelets: 244 10*3/uL (ref 150.0–400.0)
RBC: 3.94 Mil/uL (ref 3.87–5.11)
RDW: 13.3 % (ref 11.5–15.5)
WBC: 5.9 10*3/uL (ref 4.0–10.5)

## 2023-10-31 LAB — COMPREHENSIVE METABOLIC PANEL WITH GFR
ALT: 29 U/L (ref 0–35)
AST: 24 U/L (ref 0–37)
Albumin: 4.3 g/dL (ref 3.5–5.2)
Alkaline Phosphatase: 94 U/L (ref 39–117)
BUN: 22 mg/dL (ref 6–23)
CO2: 27 meq/L (ref 19–32)
Calcium: 9.7 mg/dL (ref 8.4–10.5)
Chloride: 106 meq/L (ref 96–112)
Creatinine, Ser: 0.99 mg/dL (ref 0.40–1.20)
GFR: 54.15 mL/min — ABNORMAL LOW (ref >60.00)
Glucose, Bld: 109 mg/dL — ABNORMAL HIGH (ref 70–99)
Potassium: 4.5 meq/L (ref 3.5–5.1)
Sodium: 141 meq/L (ref 135–145)
Total Bilirubin: 0.4 mg/dL (ref 0.2–1.2)
Total Protein: 7.6 g/dL (ref 6.0–8.3)

## 2023-10-31 LAB — IBC + FERRITIN
Ferritin: 69.5 ng/mL (ref 10.0–291.0)
Iron: 42 ug/dL (ref 42–145)
Saturation Ratios: 12.9 % — ABNORMAL LOW (ref 20.0–50.0)
TIBC: 324.8 ug/dL (ref 250.0–450.0)
Transferrin: 232 mg/dL (ref 212.0–360.0)

## 2023-10-31 LAB — VITAMIN B12: Vitamin B-12: 1388 pg/mL — ABNORMAL HIGH (ref 211–911)

## 2023-10-31 LAB — SEDIMENTATION RATE: Sed Rate: 19 mm/h (ref 0–30)

## 2023-10-31 MED ORDER — AMOXICILLIN-POT CLAVULANATE 875-125 MG PO TABS: 1.0000 | ORAL_TABLET | Freq: Two times a day (BID) | ORAL | 0 refills | Status: DC

## 2023-10-31 MED ORDER — DICYCLOMINE HCL 20 MG PO TABS: 20.0000 mg | ORAL_TABLET | Freq: Three times a day (TID) | ORAL | 0 refills | Status: DC | PRN

## 2023-10-31 NOTE — Progress Notes (Addendum)
 10/31/2023 [name] [id number] July 15, 1946  Referring provider: Pincus Sanes, MD Primary GI doctor: Dr. Rhea Belton  ASSESSMENT AND PLAN:  Lower AB pain x Sunday went to stand and had left lower AB pain into her suprapubic discomfort, with associated loose stools, chills Worse with change in position, bowel movement Has had chills, no fever, no nausea, vomiting 10/08/2023 Colon with polyps, tics recall 09/2026 Suspicious for diverticulitis/colitis with history and physical exam, patient hemodynamically stable - get CBC, CMET, and sed rate. -Will schedule for CT AB and pelvis with contrast to evaluate further -IBGARD daily, will give Bentyl as needed, heating pad and liquid diet. - Prescribed Augmentin - ER precautions discussed with the patient -close follow up - if negative possible post colonoscopy flora disruption versus pelvic floor with urinary incontinence versus MSK but negative carrnett on exam - can try salon pas patches  GERD CT cardiac with distal esophageal thickening and EG juctions EGD 10/08/2023 pending H pylori addendum otherwise unremarkable  Hepatic steatosis Seen on cardiac CT Normal CBC, LFTs Recheck CBC, LFTs every 6 months, weight loss discussed  Chest pain Cardiac CT 09/2023 stable mild moderate CAD  Patient Care Team: Pincus Sanes, MD as PCP - General (Internal Medicine) Meriam Sprague, MD (Inactive) as PCP - Cardiology (Cardiology) Pyrtle, Carie Caddy, MD as Consulting Physician (Gastroenterology) Judi Saa, DO as Consulting Physician (Family Medicine) Antony Contras, MD as Consulting Physician (Ophthalmology)  HISTORY OF PRESENT ILLNESS:  Discussed the use of AI scribe software for clinical note transcription with the patient, who gave verbal consent to proceed.  History of Present Illness   [name] is a 78 year old female with diverticulosis who presents with abdominal pain and changes in bowel habits.  She experiences  severe abdominal pain that began on Sunday, October 28, 2023. The pain is described as 'ripping, cramping' and is exacerbated by changes in position, such as moving from sitting to standing. It is primarily located in the right lower quadrant and radiates to the left side. The pain is intermittent, particularly occurring when attempting to have a bowel movement.  She has difficulty with bowel movements, noting that she has to 'sit and wait' due to the pain. Her last bowel movement was on October 30, 2023, and she describes it as loose. She also reports the presence of blood in her stool, which she attributes to hemorrhoids. No fever is present, but she reports chills. No nausea or vomiting, although she did feel nauseated prior to her recent colonoscopy. She uses Zofran for nausea, which she finds helpful.  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 She is awaiting results for H. pylori testing.  She experiences occasional chest pain, described as a 'bee sting' sensation, which lasts for a few seconds. She also reports urinary incontinence but no new urinary symptoms.  She mentions feeling tired for several months, although she does not believe it is related to her current abdominal issues.        She  reports that she has never smoked. She has never used smokeless tobacco. She reports current alcohol use. She reports that she does not use drugs.  RELEVANT GI HISTORY, IMAGING AND LABS: Results   DIAGNOSTIC EGD: Inflammation observed (10/08/2023) Colonoscopy: Diverticulosis observed (10/08/2023)  PROCEDURE EGD: Dilation performed (10/08/2023) Colonoscopy: Three polyps removed (10/08/2023)  PATHOLOGY Polyp pathology: Tubular adenomatous polyps (10/08/2023) Gastric biopsy: Chronic inactive gastritis, focal intestinal metaplasia  (10/08/2023)     EGD 10/08/2023- LA Grade A reflux esophagitis with no bleeding. - No endoscopic esophageal abnormality to explain patient' s dysphagia. Esophagus dilated with 52 Fr Maloney. - Gastritis. Biopsied. - Duodenitis. - Normal second portion of the duodenum.  Colonoscopy - Three 3 to 5 mm polyps in the transverse colon, removed with a cold snare. Resected and retrieved. - One 4 mm polyp in the sigmoid colon, removed with a cold snare. Complete resection. Polyp tissue not retrieved. - Moderate diverticulosis in the sigmoid colon and in the distal descending colon.  Echo EF 60-65%moderate AR, no AS  CBC    Component Value Date/Time   WBC 6.9 08/15/2023 1553   WBC 8.3 10/25/2022 0851   RBC 4.12 08/15/2023 1553   RBC 4.38 10/25/2022 0851   HGB 12.1 08/15/2023 1553   HCT 37.3 08/15/2023 1553   PLT 252 08/15/2023 1553   MCV 91 08/15/2023 1553   MCH 29.4 08/15/2023 1553   MCH 29.2 01/18/2022 1554   MCHC 32.4 08/15/2023 1553   MCHC 34.0 10/25/2022 0851   RDW 12.4 08/15/2023 1553   LYMPHSABS 1.7 10/25/2022 0851   LYMPHSABS WILL FOLLOW 02/10/2022 1131   MONOABS 0.6 10/25/2022 0851   EOSABS 0.2 10/25/2022 0851   EOSABS WILL FOLLOW 02/10/2022 1131   BASOSABS 0.1 10/25/2022 0851   BASOSABS WILL FOLLOW 02/10/2022 1131   Recent Labs    03/09/23 0826 08/15/23 1553  HGB 12.2 12.1    CMP     Component Value Date/Time   NA 141 08/15/2023 1553   K 4.5 08/15/2023 1553   CL 103 08/15/2023 1553   CO2 24 08/15/2023 1553   GLUCOSE 100 (H) 08/15/2023 1553   GLUCOSE 113 (H) 04/27/2023 0853   GLUCOSE 99 07/23/2006 1101   BUN 22 08/15/2023 1553   CREATININE 1.07 (H) 08/15/2023 1553   CREATININE 1.09 (H) 03/20/2018 0900   CALCIUM 9.9 08/15/2023 1553   PROT 7.9 04/27/2023 0853   PROT 7.2 04/17/2022 0852   ALBUMIN 4.4 04/27/2023 0853   ALBUMIN 4.4 04/17/2022 0852   AST 22 04/27/2023 0853   ALT 21 04/27/2023 0853   ALKPHOS 94 04/27/2023 0853   BILITOT 0.5 04/27/2023 0853    BILITOT 0.3 04/17/2022 0852   GFRNONAA >60 01/18/2022 1554   GFRNONAA 50 (L) 03/20/2018 0900   GFRAA 66 08/02/2020 0947   GFRAA 58 (L) 03/20/2018 0900      Latest Ref Rng & Units 04/27/2023    8:53 AM 10/25/2022    8:51 AM 04/17/2022   12:53 PM  Hepatic Function  Total Protein 6.0 - 8.3 g/dL 7.9  8.4  7.5   Albumin 3.5 - 5.2 g/dL 4.4  4.6  4.1   AST 0 - 37 U/L 22  24  21    ALT 0 - 35 U/L 21  25  17    Alk Phosphatase 39 - 117 U/L 94  96  95   Total Bilirubin 0.2 - 1.2 mg/dL 0.5  0.5  0.4       Current Medications:    Current Outpatient Medications (Cardiovascular):    amLODipine (NORVASC) 5 MG tablet, Take 1 tablet (5 mg total) by mouth daily.   EPINEPHrine (EPIPEN 2-PAK) 0.3 mg/0.3 mL IJ SOAJ injection, Use as directed for severe allergic reactions   rosuvastatin (CRESTOR) 5 MG tablet, Take 1 tablet (5 mg total) by mouth daily. (Patient taking  differently: Take 5 mg by mouth every evening. 20.5 mg)   spironolactone (ALDACTONE) 25 MG tablet, Take 0.5 tablets (12.5 mg total) by mouth daily.   Current Outpatient Medications (Analgesics):    acetaminophen (TYLENOL) 500 MG tablet, Take 2 tablets (1,000 mg total) by mouth 3 (three) times daily as needed.   aspirin 81 MG tablet, Take 81 mg by mouth daily.  Current Outpatient Medications (Hematological):    vitamin B-12 (CYANOCOBALAMIN) 1000 MCG tablet, Take 1,000 mcg by mouth 2 (two) times daily.   Current Outpatient Medications (Other):    amoxicillin-clavulanate (AUGMENTIN) 875-125 MG tablet, Take 1 tablet by mouth 2 (two) times daily.   Cholecalciferol (VITAMIN D-3) 25 MCG (1000 UT) CAPS, Take 10,000 Units by mouth daily.    diclofenac Sodium (VOLTAREN) 1 % GEL, Apply topically 4 (four) times daily as needed.    dicyclomine (BENTYL) 20 MG tablet, Take 1 tablet (20 mg total) by mouth every 8 (eight) hours as needed for spasms (AB pain).   MAGNESIUM GLYCINATE PO, Take 240 mg by mouth daily.   Omega-3 Fatty Acids (FISH OIL  BURP-LESS) 1000 MG CAPS, Takes 1280 mg 2 caps daiily   ondansetron (ZOFRAN-ODT) 4 MG disintegrating tablet, Take 1 tablet (4 mg total) by mouth every 8 (eight) hours as needed for nausea or vomiting.   Ubiquinol 100 MG CAPS, Take 1 tablet by mouth daily at 6 (six) AM.   vitamin E 400 UNIT capsule, Take 400 Units by mouth daily.   zinc gluconate 50 MG tablet, Take 50 mg by mouth daily.  Medical History:  Past Medical History:  Diagnosis Date   Allergy    seasonal   Ankylosing spondylitis (HCC)    Anxiety    Arthritis    Bell palsy    yrs ago   Blood transfusion without reported diagnosis    CAD (coronary artery disease)    a. Coronary CT 10/2016 showed calcium score in the 59th perecntile, moderate CAD in RCA/PDA and narrowing of the mid to distal LAD, with negative FFR.   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    GERD (gastroesophageal reflux disease)    history of CTS (carpal tunnel syndrome)    History of kidney stones    HLA B27 (HLA B27 positive)    Hx of colonic polyps    Hyperlipidemia    NMR 2005   Hypertension    Hypothyroidism    Incontinence    Internal hemorrhoids    Interstitial cystitis    Kidney stone    Menopause    Mild aortic insufficiency    Mild ascending aorta dilatation (HCC)    [facility]   Osteoarthritis    shoulder, ankles, hands   Osteopenia    [facility] [address]   Osteopenia    Pneumonia    2 x as child   Pre-diabetes    PSVT (paroxysmal supraventricular tachycardia) (HCC)    S/P total hysterectomy and bilateral salpingo-oophorectomy 04/12/2014   Sinusitis    Sleep apnea    c-pap   Subjective visual disturbance of both eyes 12/30/2013   Tubular adenoma of colon    Vitamin D deficiency    Xerostomia    and Xeroophthalmia(oral dryness)    Allergies:  Allergies  Allergen Reactions   Bactrim [Sulfamethoxazole-Trimethoprim] Other (See Comments)   Pentosan Polysulfate Other (See Comments)    Elevated liver count Elevated liver enzymes Elevated LFTs.......Marland Kitchen elmiron  Other reaction(s): Not available elmiron   Pentosan Polysulfate Sodium Other (See Comments)    Elevated LFTs.......Marland Kitchen elmiron    Promethazine Hcl Other (See Comments)    "jittery on the inside"   Sulfamethoxazole-Trimethoprim Nausea Only    Unknown reaction per pt, Pt thinks she may have felt sick to her stomach.    Lisinopril Cough   Losartan Other (See Comments)    Pt states caused insomnia, tingling sensation in legs, lightheaded, stomach pain   Pork (Diagnostic) Nausea Only   Promethazine Hcl Anxiety     Surgical History:  She  has a past surgical history that includes Total abdominal hysterectomy w/ bilateral salpingoophorectomy; Bladder surgery; G 1 P 1; colonscopy; interstitial cystitis; Colonoscopy w/ polypectomy; Finger surgery (Left, 2004); Cystoscopy with retrograde pyelogram, ureteroscopy and stent placement (Left, 09/01/2013); Carpal tunnel release (Left, 12/22/2014); Shoulder arthroscopy (Left); Carpal tunnel release (Right, 02/18/2019); cataract excision (Bilateral, 2022); Excision of keloid (N/A, 03/09/2023); Colonoscopy (2019); and Colonoscopy with esophagogastroduodenoscopy (egd) (10/08/2023). Family History:  Her family history includes Allergic rhinitis in her sister and sister; Asthma in her mother; Breast cancer in her maternal aunt; Cirrhosis in her father; Colon cancer (age of onset: 31) in her sister; Coronary artery disease in her maternal grandfather and mother; Dementia in her mother; Diabetes in her father, paternal aunt, and paternal grandmother; Food Allergy in her sister; Hypertension in her mother; Renal cancer in her paternal aunt; Stroke in her mother.  REVIEW OF SYSTEMS  : All other systems reviewed and negative except where  noted in the History of Present Illness.  PHYSICAL EXAM: BP 120/70   Pulse 65   Ht 5' 2.5" (1.588 m)   Wt 165 lb 12.8 oz (75.2 kg)   SpO2 94%   BMI 29.84 kg/m  Physical Exam   GENERAL APPEARANCE: Well nourished, in no apparent distress. HEENT: No cervical lymphadenopathy, unremarkable thyroid, sclerae anicteric, conjunctiva pink. RESPIRATORY: Respiratory effort normal, breath sounds equal bilaterally without rales, rhonchi, or wheezing. CARDIO: Regular rate and rhythm with no murmurs, rubs, or gallops, peripheral pulses intact. ABDOMEN: Soft, non-distended, active bowel sounds in all four quadrants. Epigastric pain and right lower quadrant pain with tenderness. Pain radiates to the left on right lower quadrant palpation. No rebound tenderness on release. Negative Carnett's sign. RECTAL: Declines. MUSCULOSKELETAL: Full range of motion, normal gait, without edema. SKIN: Dry, intact without rashes or lesions. No jaundice. NEURO: Alert, oriented, no focal deficits. PSYCH: Cooperative, normal mood and affect.      Doree Albee, PA-C 11:44 AM

## 2023-10-31 NOTE — Addendum Note (Signed)
 Addended by: Rise Paganini on: 10/31/2023 02:50 PM   Modules accepted: Orders

## 2023-10-31 NOTE — Patient Instructions (Signed)
 You have been scheduled for a CT scan of the abdomen and pelvis at [facility], 1st floor Radiology. You are scheduled on 11/01/23 at 9:00 am. You should arrive 15-20 minutes prior to your appointment time for registration.  If you have any questions regarding your exam or if you need to reschedule, you may call [facility] at [phone] between the hours of 8:00 am and 5:00 pm, Monday-Friday.    Your provider has requested that you go to the basement level for lab work before leaving today. Press "B" on the elevator. The lab is located at the first door on the left as you exit the elevator.  Will give Augmentin  Set up CT AB and pelvis with contrast Can take dicyclomine at least 1-2 x a day for pain if needed.   Can do heating pad and can take tylenol max of 3000mg  a day.  Can add on lidocaine patches called salon pas Go to the ER if unable to pass gas, severe AB pain, unable to hold down food, any shortness of breath of chest pain.   Diverticulitis Diverticulitis is inflammation or infection of small pouches in your colon that form when you have a condition called diverticulosis. The pouches in your colon are called diverticula. Your colon, or large intestine, is where water is absorbed and stool is formed. Complications of diverticulitis can include: Bleeding. Severe infection. Severe pain. Perforation of your colon. Obstruction of your colon.  What are the causes? Diverticulitis is caused by bacteria. Diverticulitis happens when stool becomes trapped in diverticula. This allows bacteria to grow in the diverticula, which can lead to inflammation and infection. What increases the risk? People with diverticulosis are at risk for diverticulitis. Eating a diet that does not include enough fiber from fruits and vegetables may make diverticulitis more likely to develop. What are the signs or symptoms? Symptoms of diverticulitis may include: Abdominal pain and  tenderness. The pain is normally located on the left side of the abdomen, but may occur in other areas. Fever and chills. Bloating. Cramping. Nausea. Vomiting. Constipation. Diarrhea. Blood in your stool.  How is this diagnosed? Your health care provider will ask you about your medical history and do a physical exam. You may need to have tests done because many medical conditions can cause the same symptoms as diverticulitis. Tests may include: Blood tests. Urine tests. Imaging tests of the abdomen, including X-rays and CT scans.  When your condition is under control, your health care provider may recommend that you have a colonoscopy. A colonoscopy can show how severe your diverticula are and whether something else is causing your symptoms. How is this treated? Most cases of diverticulitis are mild and can be treated at home. Treatment may include: Taking over-the-counter pain medicines. Following a clear liquid diet. Taking antibiotic medicines by mouth for 7-10 days.  More severe cases may be treated at a hospital. Treatment may include: Not eating or drinking. Taking prescription pain medicine. Receiving antibiotic medicines through an IV tube. Receiving fluids and nutrition through an IV tube. Surgery.  Follow these instructions at home: Follow your health care provider's instructions carefully. Follow a full liquid diet or other diet as directed by your health care provider. After your symptoms improve, your health care provider may tell you to change your diet. He or she may recommend you eat a high-fiber diet. Fruits and vegetables are good sources of fiber. Fiber makes it easier to pass stool. Take fiber supplements or probiotics  as directed by your health care provider. Only take medicines as directed by your health care provider. Keep all your follow-up appointments. Contact a health care provider if: Your pain does not improve. You have a hard time eating food. Your  bowel movements do not return to normal. Get help right away if: Your pain becomes worse. Your symptoms do not get better. Your symptoms suddenly get worse. You have a fever. You have repeated vomiting. You have bloody or black, tarry stools. This information is not intended to replace advice given to you by your health care provider. Make sure you discuss any questions you have with your health care provider. Document Released: 04/26/2005 Document Revised: 12/23/2015 Document Reviewed: 06/11/2013 Elsevier Interactive Patient Education  2017 [employer].

## 2023-11-01 ENCOUNTER — Ambulatory Visit (HOSPITAL_COMMUNITY)
Admission: RE | Admit: 2023-11-01 | Discharge: 2023-11-01 | Disposition: A | Discharge status: Home / Self Care | Source: Ambulatory Visit | Attending: Physician Assistant | Admitting: Physician Assistant

## 2023-11-01 DIAGNOSIS — R1032 Left lower quadrant pain: Secondary | ICD-10-CM | POA: Insufficient documentation

## 2023-11-01 MED ORDER — IOHEXOL 350 MG/ML SOLN
75.0000 mL | Freq: Once | INTRAVENOUS | Status: AC | PRN
  Administered 2023-11-01: 75 mL via INTRAVENOUS

## 2023-11-25 NOTE — Progress Notes (Unsigned)
 Cardiology Office Note    Date:  11/26/2023  ID:  [name], [name] 12/15/1945, MRN [id number] PCP:  Colene Dauphin, MD  Cardiologist:  Sonny Dust, MD (Inactive)  Electrophysiologist:  None   Chief Complaint:   History of Present Illness: .    [name] is a 78 y.o. 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 She has requested close cardiology follow-up on a regular basis. Her husband Asa Lauth (who has a complex medical history and for whom she is primary caregiver) and daughter Dana Duncan are also followed by our clinic.   She previously followed with Dr. Nicholette Barley for history of atypical sharp chest pain and nonobstructive CAD. Event monitor for palpitations 2022 showed NSR with short runs of SVT, rare PVCs, PACs, average HR 75bpm, range 42-176 (low reading was during sleeping hours). At OV 09/2021, she reported intermittent fleeting dizziness with repeat monitor NSR average HR 75bpm, 20 nonsustained SVT events (longest 14 beats), <1% PACs/PVCs, triggered events mostly correlated with NSR, occasionally with PACS/PVCs and 1 run of SVT. She was started on carvedilol  for SVT + BP. She went to ER in 12/2021 for CVA r/o with reassuring workup including CTA head/neck. Meclizine  was rx'd and recommended to f/u neurology. She previously had LE edema and eye puffiness which she thought might be due to amlodipine  but did not tolerate hydrochlorothiazide  (muscle cramping), chlorthalidone  (leg cramping), or spironolactone  (felt like unable to void). More recently she has been on amlodipine  and low dose spironolactone  (restarted  09/2023 by PCP for hair loss) and doing well. She was previously on carvedilol  but discontinued off med list at PCP visit 09/2023. She saw [facility] for varicose veins 11/2022 who recommended conservative measures. F/u echo 07/2023 to assess AI/aorta showed EF 60-65%, mild RV enlargement, moderate AI, borderline dilation of ascending aorta, stable. Updated coronary CTA 09/2023 showed CAC 256 (69%ile), minimal plaque in prox LAD/D1, moderate stenosis in the prox RCA and distal RCA, 40mm ascending aortic dilation, PFO, aortic atherosclerosis, FFR negative. Incidental findings showed thickening of the distal esophagus/EG junction, hepatic steatosis, and subpleural reticulation without interval change. She underwent scope by GI in 09/2023 showing esophagitis with mild inflammation.   She also has previously followed with lipid clinic. She has a history of mylagias/RLS-type neuropathy although it was not clear this was specifically related to statin itself since this also occurred while off statin in the past. LE arterial testing 2020 was normal. She was also noted to have mildly elevated CK which normalized off statin. She saw pharmD in follow-up 02/2022 given issues with statin/CK and was re-trialed on low dose rosuvastatin  with lab follow-up. F/u CK mildly elevated at 220, pharmD felt OK to continue.  She is seen back for follow-up without any new or accelerating cardiac symptoms. She can still feel occasional ping of focal chest wall discomfort. Breathing and palpitations are stable. She recently completed a course of abx for diverticulitis. She sees pulmonology in a few weeks for the findings seen on CT above. Her mild LE edema waxes and wanes.  Labwork independently reviewed: 10/2023 K 4.5, Cr 0.99, LFTs ok, Hgb 11.6, plt ok, ESR OK 08/2023 TSH OK 04/2023 LDL 38, trig 94  ROS: .  Please see the history of present illness.  All other systems are reviewed and otherwise negative.  Studies Reviewed: Aaron Aas    EKG:  EKG  is not ordered today  CV Studies: Cardiac studies reviewed are outlined and summarized above. Otherwise please see EMR for full report.   Current Reported Medications:.    Current Meds  Medication Sig   acetaminophen  (TYLENOL ) 500 MG tablet Take 2 tablets (1,000 mg total) by mouth 3 (three) times daily as needed.   amLODipine  (NORVASC ) 5 MG tablet Take 1 tablet (5 mg total) by mouth daily.   aspirin  81 MG tablet Take 81 mg by mouth daily.   Cholecalciferol (VITAMIN D -3) 25 MCG (1000 UT) CAPS Take 10,000 Units by mouth daily.    diclofenac  Sodium (VOLTAREN ) 1 % GEL Apply topically 4 (four) times daily as needed.    dicyclomine  (BENTYL ) 20 MG tablet Take 1 tablet (20 mg total) by mouth every 8 (eight) hours as needed for spasms (AB pain).   EPINEPHrine  (EPIPEN  2-PAK) 0.3 mg/0.3 mL IJ SOAJ injection Use as directed for severe allergic reactions   MAGNESIUM  GLYCINATE PO Take 240 mg by mouth daily.   Omega-3 Fatty Acids (FISH OIL BURP-LESS) 1000 MG CAPS Takes 1280 mg 2 caps daiily   ondansetron  (ZOFRAN -ODT) 4 MG disintegrating tablet Take 1 tablet (4 mg total) by mouth every 8 (eight) hours as needed for nausea or vomiting.   rosuvastatin  (CRESTOR ) 5 MG tablet Take 5 mg by mouth daily.   spironolactone  (ALDACTONE ) 25 MG tablet Take 0.5 tablets (12.5 mg total) by mouth daily.   Ubiquinol 100 MG CAPS Take 1 tablet by mouth daily at 6 (six) AM.   vitamin B-12 (CYANOCOBALAMIN ) 1000 MCG tablet Take 1,000 mcg by mouth 2 (two) times daily.    vitamin E 400 UNIT capsule Take 400 Units by mouth daily.   zinc  gluconate 50 MG tablet Take 50 mg by mouth daily.   [DISCONTINUED] amoxicillin -clavulanate (AUGMENTIN ) 875-125 MG tablet Take 1 tablet by mouth 2 (two) times daily.   [DISCONTINUED] rosuvastatin  (CRESTOR ) 5 MG tablet Take 1 tablet (5 mg total) by mouth daily. (Patient taking differently: Take 5 mg by mouth every evening.)    Physical Exam:    VS:  BP 110/78 (BP Location: Left Arm, Patient  Position: Sitting, Cuff Size: Normal)   Pulse 61   Wt 162 lb (73.5 kg)   SpO2 98%   BMI 29.16 kg/m    Wt Readings from Last 3 Encounters:  11/26/23 162 lb (73.5 kg)  10/31/23 165 lb 12.8 oz (75.2 kg)  10/08/23 162 lb (73.5 kg)    GEN: Well nourished, well developed in no acute distress NECK: No JVD; No carotid bruits CARDIAC: RRR, no murmurs, rubs, gallops RESPIRATORY:  Clear to auscultation without rales, wheezing or rhonchi  ABDOMEN: Soft, non-tender, non-distended EXTREMITIES:  Mild ankle edema bilaterally; No acute deformity   Asessement and Plan:.    1. CAD, HLD - recent coronary CTA reassuring against progression to obstructive disease. No accelerating symptoms - continue ASA 81mg  daily - continue rosuvastatin  5mg  daily (lipids at goal 04/2023, anticipate arranging recheck at next OV if not obtained by PCP in interim) - she is also pending referral to pulmonology given subpleural reticulation on CT of uncertain significance, does have h/o autoimmune disease so appreciate their input  2. Aortic insufficiency - remained moderate by 07/2023 echo. - anticipate surveillance echo 06/2024, can order in follow-up  3. Mild dilation of ascending aorta - 39mm by  echo 07/2023, 40mm by CT 09/2023. - will review timing of surveillance at next OV, likely CTA 08/2024 - although she is only borderline enlarged, outlined general aneurysm precautions on AVS today - daughter Dana Duncan had CT/echo in 2022 that showed normal caliber aorta  4. Essential HTN, mild LE edema - controlled on present regimen. We discussed potentially decreasing amlodipine  for her ankle edema, but per shared decision making, given difficulty with tolerance of med changes in the past, we will leave at current regimen. - continue amlodipine  5mg  daily - continue spironolactone  12.5mg  daily (potassium stable by recent labs)  5. PSVT, PACs, PVCs - quiescent, not requiring intervention at this time    Disposition: F/u with  me in 6 months.  Signed, Athanasia Stanwood N Clinton Dragone, PA-C

## 2023-11-26 ENCOUNTER — Encounter: Payer: Self-pay | Admitting: Physician Assistant

## 2023-11-26 ENCOUNTER — Ambulatory Visit: Payer: Medicare Other | Attending: Physician Assistant | Admitting: Physician Assistant

## 2023-11-26 VITALS — BP 110/78 | HR 61 | Wt 162.0 lb

## 2023-11-26 DIAGNOSIS — I351 Nonrheumatic aortic (valve) insufficiency: Secondary | ICD-10-CM | POA: Diagnosis not present

## 2023-11-26 DIAGNOSIS — E785 Hyperlipidemia, unspecified: Secondary | ICD-10-CM | POA: Insufficient documentation

## 2023-11-26 DIAGNOSIS — I493 Ventricular premature depolarization: Secondary | ICD-10-CM | POA: Insufficient documentation

## 2023-11-26 DIAGNOSIS — I491 Atrial premature depolarization: Secondary | ICD-10-CM | POA: Insufficient documentation

## 2023-11-26 DIAGNOSIS — I2510 Atherosclerotic heart disease of native coronary artery without angina pectoris: Secondary | ICD-10-CM | POA: Diagnosis not present

## 2023-11-26 DIAGNOSIS — I471 Supraventricular tachycardia, unspecified: Secondary | ICD-10-CM | POA: Diagnosis not present

## 2023-11-26 DIAGNOSIS — I10 Essential (primary) hypertension: Secondary | ICD-10-CM | POA: Insufficient documentation

## 2023-11-26 NOTE — Patient Instructions (Signed)
 Medication Instructions:  Your physician recommends that you continue on your current medications as directed. Please refer to the Current Medication list given to you today.  *If you need a refill on your cardiac medications before your next appointment, please call your pharmacy*  Lab Work: None ordered  If you have labs (blood work) drawn today and your tests are completely normal, you will receive your results only by: MyChart Message (if you have MyChart) OR A paper copy in the mail If you have any lab test that is abnormal or we need to change your treatment, we will call you to review the results.  Testing/Procedures: None ordered  Follow-Up: At [facility], you and your health needs are our priority.  As part of our continuing mission to provide you with exceptional heart care, our providers are all part of one team.  This team includes your primary Cardiologist (physician) and Advanced Practice Providers or APPs (Physician Assistants and Nurse Practitioners) who all work together to provide you with the care you need, when you need it.  Your next appointment:   6 month(s)  Provider:   Dayna Dunn, PA-C          We recommend signing up for the patient portal called "MyChart".  Sign up information is provided on this After Visit Summary.  MyChart is used to connect with patients for Virtual Visits (Telemedicine).  Patients are able to view lab/test results, encounter notes, upcoming appointments, etc.  Non-urgent messages can be sent to your provider as well.   To learn more about what you can do with MyChart, go to [email or web address].   Other Instructions  Information About Your Aneurysm  One of your tests has shown enlargement of your aorta. If this progresses in size, we may refer to this as an aneurysm. The word "aneurysm" refers to a bulge in an artery (blood vessel). Most people think of them in the context of an emergency, but yours was found incidentally.  At this point there is nothing you need to do from a procedure standpoint, but there are some important things to keep in mind for day-to-day life.  Mainstays of therapy for aneurysms include very good blood pressure control, healthy lifestyle, and avoiding tobacco products and street drugs. Research has raised concern that antibiotics in the fluoroquinolone class could be associated with increased risk of having an aneurysm develop or tear. This includes medicines that end in "floxacin," like Cipro  or Levaquin. Make sure to discuss this information with other healthcare providers if you require antibiotics.  Since aneurysms can run in families, you should discuss your diagnosis with first degree relatives as they may need to be screened for this. Regular mild-moderate physical exercise is important, but avoid heavy lifting/weight lifting over 30lbs, chopping wood, shoveling snow or digging heavy earth with a shovel. It is best to avoid activities that cause grunting or straining (medically referred to as a "Valsalva maneuver"). This happens when a person bears down against a closed throat to increase the strength of arm or abdominal muscles. There's often a tendency to do this when lifting heavy weights, doing sit-ups, push-ups or chin-ups, etc., but it may be harmful.  This is a finding I would expect to be monitored periodically by your cardiology team. Most unruptured thoracic aortic aneurysms cause no symptoms, so they are often found during exams for other conditions. Contact a health care provider if you develop any discomfort in your upper back, neck, abdomen, trouble swallowing, cough or  hoarseness, or unexplained weight loss. Get help right away if you develop severe pain in your upper back or abdomen that may move into your chest and arms, or any other concerning symptoms such as shortness of breath or fever.

## 2023-11-30 NOTE — Progress Notes (Signed)
 Addendum: Reviewed and agree with assessment and management plan. CT confirmed diverticulitis which was subsequently treated Nohelia Valenza, Amber Bail, MD

## 2023-12-12 ENCOUNTER — Encounter: Payer: Self-pay | Admitting: Internal Medicine

## 2023-12-12 ENCOUNTER — Ambulatory Visit: Admitting: Internal Medicine

## 2023-12-12 VITALS — BP 108/62 | HR 62 | Temp 98.0°F | Ht 62.5 in | Wt 167.2 lb

## 2023-12-12 DIAGNOSIS — K219 Gastro-esophageal reflux disease without esophagitis: Secondary | ICD-10-CM | POA: Diagnosis not present

## 2023-12-12 DIAGNOSIS — K2100 Gastro-esophageal reflux disease with esophagitis, without bleeding: Secondary | ICD-10-CM

## 2023-12-12 DIAGNOSIS — R9389 Abnormal findings on diagnostic imaging of other specified body structures: Secondary | ICD-10-CM

## 2023-12-12 DIAGNOSIS — Z7722 Contact with and (suspected) exposure to environmental tobacco smoke (acute) (chronic): Secondary | ICD-10-CM

## 2023-12-12 DIAGNOSIS — R0602 Shortness of breath: Secondary | ICD-10-CM

## 2023-12-12 DIAGNOSIS — G4733 Obstructive sleep apnea (adult) (pediatric): Secondary | ICD-10-CM | POA: Diagnosis not present

## 2023-12-12 NOTE — Patient Instructions (Addendum)
 It was a pleasure to see you today!  Please schedule follow up with myself in 6 weeks.  If my schedule is not open yet, we will contact you with a reminder closer to that time. Please call [phone] if you haven't heard from us  a month before, and always call us  sooner if issues or concerns arise. You can also send us  a message through MyChart, but but aware that this is not to be used for urgent issues and it may take up to 5-7 days to receive a reply. Please be aware that you will likely be able to view your results before I have a chance to respond to them. Please give us  5 business days to respond to any non-urgent results.    Before your next visit I would like you to have: PFTs CT Chest  VISIT SUMMARY:  Today, you visited to discuss concerns about interstitial lung disease following a CT scan. You mentioned experiencing shortness of breath with exertion and a chronic cough, which you attributed to allergies. We discussed your history of gastroesophageal reflux disease (GERD) and its potential link to your symptoms. You were accompanied by your daughter, Bynum Cassis.  YOUR PLAN:  -GASTROESOPHAGEAL REFLUX DISEASE (GERD): GERD is a condition where stomach acid frequently flows back into the tube connecting your mouth and stomach. This can cause symptoms like heartburn and can lead to complications such as esophageal cancer if not managed properly. We discussed lifestyle changes such as dietary adjustments, weight loss, and sleeping on a wedge pillow. You will also consult with a gastroenterologist for further management and may consider short-term or as-needed use of proton pump inhibitors.  -SHORTNESS OF BREATH ON EXERTION, ABNORMAL CT CHEST: Your CT Chest changescould be due to GERD-related microaspiration or possibly interstitial lung disease (ILD), though ILD is less likely. We will conduct further tests, including a high-resolution CT scan of your chest and pulmonary function tests, to  better understand the cause. We will follow up after these tests to discuss the results and next steps.  What is GERD? Gastroesophageal reflux disease (GERD) is gastroesophageal reflux diseasewhich occurs when the lower esophageal sphincter (LES) opens spontaneously, for varying periods of time, or does not close properly and stomach contents rise up into the esophagus. GER is also called acid reflux or acid regurgitation, because digestive juices--called acids--rise up with the food. The esophagus is the tube that carries food from the mouth to the stomach. The LES is a ring of muscle at the bottom of the esophagus that acts like a valve between the esophagus and stomach.  When acid reflux occurs, food or fluid can be tasted in the back of the mouth. When refluxed stomach acid touches the lining of the esophagus it may cause a burning sensation in the chest or throat called heartburn or acid indigestion. Occasional reflux is common. Persistent reflux that occurs more than twice a week is considered GERD, and it can eventually lead to more serious health problems. People of all ages can have GERD. Studies have shown that GERD may worsen or contribute to asthma, chronic cough, and pulmonary fibrosis.   What are the symptoms of GERD? The main symptom of GERD in adults is frequent heartburn, also called acid indigestion--burning-type pain in the lower part of the mid-chest, behind the breast bone, and in the mid-abdomen.  Not all reflux is acidic in nature, and many patients don't have heart burn at all. Sometimes it feels like a cough (either dry or with  mucus), choking sensation, asthma, shortness of breath, waking up at night, frequent throat clearing, or trouble swallowing.    What causes GERD? The reason some people develop GERD is still unclear. However, research shows that in people with GERD, the LES relaxes while the rest of the esophagus is working. Anatomical abnormalities such as a hiatal  hernia may also contribute to GERD. A hiatal hernia occurs when the upper part of the stomach and the LES move above the diaphragm, the muscle wall that separates the stomach from the chest. Normally, the diaphragm helps the LES keep acid from rising up into the esophagus. When a hiatal hernia is present, acid reflux can occur more easily. A hiatal hernia can occur in people of any age and is most often a normal finding in otherwise healthy people over age 67. Most of the time, a hiatal hernia produces no symptoms.   Other factors that may contribute to GERD include - Obesity or recent weight gain - Pregnancy  - Smoking  - Diet - Certain medications  Common foods that can worsen reflux symptoms include: - carbonated beverages - artificial sweeteners - citrus fruits  - chocolate  - drinks with caffeine or alcohol  - fatty and fried foods  - garlic and onions  - mint flavorings  - spicy foods  - tomato-based foods, like spaghetti sauce, salsa, chili, and pizza   Lifestyle Changes If you smoke, stop.  Avoid foods and beverages that worsen symptoms (see above.) Lose weight if needed.  Eat small, frequent meals.  Wear loose-fitting clothes.  Avoid lying down for 3 hours after a meal.  Raise the head of your bed 6 to 8 inches by securing wood blocks under the bedposts. Just using extra pillows will not help, but using a wedge-shaped pillow may be helpful.  Medications  H2 blockers, such as cimetidine (Tagamet HB), famotidine (Pepcid AC), nizatidine (Axid AR), and ranitidine  (Zantac  75), decrease acid production. They are available in prescription strength and over-the-counter strength. These drugs provide short-term relief and are effective for about half of those who have GERD symptoms.  Proton pump inhibitors include omeprazole  (Prilosec, Zegerid), lansoprazole (Prevacid), pantoprazole (Protonix), rabeprazole (Aciphex), and esomeprazole (Nexium), which are available by prescription.  Prilosec is also available in over-the-counter strength. Proton pump inhibitors are more effective than H2 blockers and can relieve symptoms and heal the esophageal lining in almost everyone who has GERD.  Because drugs work in different ways, combinations of medications may help control symptoms. People who get heartburn after eating may take both antacids and H2 blockers. The antacids work first to neutralize the acid in the stomach, and then the H2 blockers act on acid production. By the time the antacid stops working, the H2 blocker will have stopped acid production. Your health care provider is the best source of information about how to use medications for GERD.   Points to Remember 1. You can have GERD without having heartburn. Your symptoms could include a dry cough, asthma symptoms, or trouble swallowing.  2. Taking medications daily as prescribed is important in controlling you symptoms.  Sometimes it can take up to 8 weeks to fully achieve the effects of the medications prescribed.  3. Coughing related to GERD can be difficult to treat and is very frustrating!  However, it is important to stick with these medications and lifestyle modifications before pursuing more aggressive or invasive test and treatments.

## 2023-12-12 NOTE — Progress Notes (Signed)
 [name]    [id number]    Nov 02, 1945  Primary Care Physician:Burns, Beckey Bourgeois, MD  Referring Physician: Colene Dauphin, MD [address] [place],  [place] [address] Reason for Consultation: abnormal CT Chest Date of Consultation: 12/12/2023  Chief complaint:   Chief Complaint  Patient presents with   Consult    Abnormal CT 11/01/2023. Occass. Sob with stairs, cough-dry     HPI: Discussed the use of AI scribe software for clinical note transcription with the patient, who gave verbal consent to proceed.  History of Present Illness AVE SCHMIER is a 78 year old female who presents with concerns about interstitial lung disease following a CT scan. She is accompanied by her daughter, [name]. She was referred for evaluation of findings on a CT scan - CT Coronary calcium  score which showed possible ILD.   She experiences shortness of breath with exertion, particularly when climbing stairs or after outdoor activities, which began a few months ago. No wheezing is present, but she describes 'breathing heavily' after exertion. She is able to perform daily activities, attributing any limitations to osteoarthritis rather than respiratory issues.  She has a chronic cough described as 'croupish,' present for at least a year, which she attributes to allergies. She experiences seasonal allergies with symptoms like postnasal drip and itchy, watery eyes. The cough is more noticeable at night, especially when lying down, but does not produce sputum. No treatments have been tried for the cough.  Her past medical history includes gastroesophageal reflux disease, confirmed by endoscopy, but she is not currently on medication for it. She has a history of recurrent urinary tract infections and was diagnosed with interstitial cystitis. She also has osteoarthritis and has been tested for HLA-B27 due to a rheumatology workup, though she does not recall a specific reason for the test. She has not  formally been diagnosed with an autoimmune disease or arthropathy.  Her family history includes asthma in her mother and allergies in her siblings. She has never smoked but was exposed to secondhand smoke from her husband's pipe smoking.  She uses a CPAP machine for sleep apnea but admits to inconsistent use due to exhaustion from caregiving responsibilities for her husband, a disabled veteran.  Never been on amiodarone. Has been on nitrofurantoin  x 1 many years ago. No pets at home. She denies recurrent bronchitis/pneumonia. 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     Social history:  Occupation: retired Midwife Exposures: lives with husband.  Smoking history: never smoker, no passive smoke exposure   Social History   Occupational History   Occupation: retired   Occupation: Runner, broadcasting/film/video    Comment: Kindergarten  Tobacco Use   Smoking status: Never    Passive exposure: Past   Smokeless tobacco: Never  Vaping Use   Vaping status: Never Used  Substance and Sexual Activity   Alcohol use: Yes    Comment: rare   Drug use: No   Sexual activity: Not Currently    Relevant family history:  Family History  Problem Relation Age of Onset   Coronary artery disease Mother    Hypertension Mother    Asthma Mother    Dementia Mother    Stroke Mother        mini cva   Diabetes Father    Cirrhosis Father        non alcoholic   Colon cancer Sister 48  Allergic rhinitis Sister    Environmental Allergies Sister    Allergic rhinitis Sister    Food Allergy  Sister    Environmental Allergies Sister    Coronary artery disease Maternal Grandfather    Diabetes Paternal Grandmother    Breast cancer Maternal Aunt    Renal cancer Paternal Aunt        x 2   Diabetes Paternal Aunt    Esophageal cancer Neg Hx    Stomach cancer Neg Hx    Colon polyps Neg Hx     Past Medical History:  Diagnosis Date   Allergy     seasonal   Ankylosing spondylitis (HCC)     Anxiety    Arthritis    Bell palsy    yrs ago   Blood transfusion without reported diagnosis    CAD (coronary artery disease)    a. Coronary CT 10/2016 showed calcium  score in the 59th perecntile, moderate CAD in RCA/PDA and narrowing of the mid to distal LAD, with negative FFR.   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    GERD (gastroesophageal reflux disease)    history of CTS (carpal tunnel syndrome)    History of kidney stones    HLA B27 (HLA B27 positive)    Hx of colonic polyps    Hyperlipidemia    NMR 2005   Hypertension    Hypothyroidism    Incontinence    Internal hemorrhoids    Interstitial cystitis    Kidney stone    Menopause    Mild aortic insufficiency    Mild ascending aorta dilatation (HCC)    followed by cardiology  [name]   Osteoarthritis    shoulder, ankles, hands   Osteopenia    [facility] [address]   Osteopenia    Pneumonia    2 x as child   Pre-diabetes    PSVT (paroxysmal supraventricular tachycardia) (HCC)    S/P total hysterectomy and bilateral salpingo-oophorectomy 04/12/2014   Sinusitis    Sleep apnea    c-pap   Subjective visual disturbance of both eyes 12/30/2013   Tubular adenoma of colon    Vitamin D  deficiency    Xerostomia    and Xeroophthalmia(oral dryness)    Past Surgical History:  Procedure Laterality Date   BLADDER SURGERY     for incontinence   CARPAL TUNNEL RELEASE Left 12/22/2014   Procedure: LEFT CARPAL TUNNEL RELEASE;  Surgeon: Lyanne Sample, MD;  Location: [facility];  Service: Orthopedics;  Laterality: Left;   CARPAL TUNNEL RELEASE Right 02/18/2019   Procedure: RIGHT CARPAL TUNNEL RELEASE;  Surgeon: Lyanne Sample, MD;  Location: [facility];  Service: Orthopedics;  Laterality: Right;   cataract excision Bilateral  2022   COLONOSCOPY  2019   COLONOSCOPY W/ POLYPECTOMY     adenomatous poyp 04-2008   COLONOSCOPY WITH ESOPHAGOGASTRODUODENOSCOPY (EGD)  10/08/2023   colonscopy     tics 10-2002   CYSTOSCOPY WITH RETROGRADE PYELOGRAM, URETEROSCOPY AND STENT PLACEMENT Left 09/01/2013   Procedure: CYSTOSCOPY WITH RETROGRADE PYELOGRAM, AND LEFT STENT PLACEMENT;  Surgeon: Soledad Dupes, MD;  Location: [facility];  Service: Urology;  Laterality: Left;   EXCISION OF KELOID N/A 03/09/2023   Procedure: EXCISIONIONAL BIOPSY SEBACEOUS CYST LEFT BUTTOCK;  Surgeon: Andy Bannister,  Evalyn Hillier, MD;  Location: [facility];  Service: General;  Laterality: N/A;   FINGER SURGERY Left 2004   2nd finger   G 1 P 1     interstitial cystitis     with clot in catheter   SHOULDER ARTHROSCOPY Left    TOTAL ABDOMINAL HYSTERECTOMY W/ BILATERAL SALPINGOOPHORECTOMY     dysfunctional menses     Physical Exam: Blood pressure 108/62, pulse 62, temperature 98 F (36.7 C), temperature source Oral, height 5' 2.5" (1.588 m), weight 167 lb 3.2 oz (75.8 kg), SpO2 97%. Gen:      No acute distress ENT:  +mild cobblestoning, no nasal polyps, mucus membranes moist Lungs:    No increased respiratory effort, symmetric chest wall excursion, clear to auscultation bilaterally, no wheezes or crackles CV:         Regular rate and rhythm; no murmurs, rubs, or gallops.  No pedal edema Abd:      + bowel sounds; soft, non-tender; no distension MSK: no acute synovitis of DIP or PIP joints, no mechanics hands.  Skin:      Warm and dry; no rashes Neuro: normal speech, no focal facial asymmetry Psych: alert and oriented x3, normal mood and affect   Data Reviewed/Medical Decision Making:  Independent interpretation of tests: Imaging:  Review of patient's CT coronary calcium  score April 2025 images revealed mild subpleural reticulation, apical-->basal gradient. The patient's images have been independently reviewed by me.    PFTs:  Labs: HLA B  27 positive Lab Results  Component Value Date   WBC 5.9 10/31/2023   HGB 11.6 (L) 10/31/2023   HCT 34.8 (L) 10/31/2023   MCV 88.3 10/31/2023   PLT 244.0 10/31/2023   Lab Results  Component Value Date   NA 141 10/31/2023   K 4.5 10/31/2023   CO2 27 10/31/2023   GLUCOSE 109 (H) 10/31/2023   BUN 22 10/31/2023   CREATININE 0.99 10/31/2023   CALCIUM  9.7 10/31/2023   GFR 54.15 (L) 10/31/2023   EGFR 53 (L) 08/15/2023   GFRNONAA >60 01/18/2022       Immunization status:  Immunization History  Administered Date(s) Administered   Fluad Quad(high Dose 65+) 04/02/2019, 04/20/2020, 04/17/2022   Fluad Trivalent(High Dose 65+) 08/22/2023   Fluzone Influenza virus vaccine,trivalent (IIV3), split virus 08/01/2003, 05/29/2007, 04/15/2008, 04/30/2009, 05/03/2010   Influenza Split 05/09/2011, 05/07/2012   Influenza Whole 08/01/2003, 05/29/2007, 04/15/2008, 04/30/2009, 05/03/2010   Influenza, High Dose Seasonal PF 05/01/2013, 04/18/2014, 04/13/2016, 05/02/2017, 04/15/2018   Influenza,inj,Quad PF,6+ Mos 05/21/2015   Influenza-Unspecified 05/09/2011, 05/07/2012, 05/21/2015, 05/02/2017   PFIZER(Purple Top)SARS-COV-2 Vaccination 09/13/2019, 10/08/2019, 06/04/2020   Pneumococcal Conjugate-13 02/25/2015   Pneumococcal Polysaccharide-23 07/31/2005, 05/27/2012   Td 06/27/2010   Tdap 08/17/2020   Zoster Recombinant(Shingrix) 06/15/2017, 08/20/2017   Zoster, Live 07/19/2012   Zoster, Unspecified 06/15/2017, 08/20/2017     I reviewed prior external note(s) from primary care, cardiology, GI  I reviewed the result(s) of the labs and imaging as noted above.   I have ordered pft, ct chest   Assessment and Plan Assessment & Plan   Shortness of breath on exertion, Abnormal CT Chest Shortness of breath on exertion, differential includes GERD-related microaspiration and possible ILD, though ILD is less likely. Further testing needed. - Order high-resolution CT scan of the chest. - Order pulmonary  function tests. - Follow up post-testing to discuss results and next steps.  Cough due to Gastroesophageal reflux disease (GERD) GERD with cough likely due to microaspiration causing CT Chest changes. Discussed  risks of long-term proton pump inhibitor use and emphasized GERD management to prevent esophageal cancer. Discussed lifestyle modifications. - Encourage lifestyle modifications: dietary changes, weight loss, sleeping on wedge pillow. - Follow up with gastroenterology for GERD management. - Consider short-term or as-needed proton pump inhibitors per gastroenterology.    Return to Care: Return in about 6 weeks (around 01/23/2024).  Louie Rover, MD Pulmonary and Critical Care Medicine [facility] Office:[phone]  CC: Colene Dauphin, MD

## 2023-12-13 ENCOUNTER — Ambulatory Visit (INDEPENDENT_AMBULATORY_CARE_PROVIDER_SITE_OTHER): Admitting: Physician Assistant

## 2023-12-13 ENCOUNTER — Encounter: Payer: Self-pay | Admitting: Physician Assistant

## 2023-12-13 VITALS — BP 100/62 | HR 64 | Ht 60.75 in | Wt 166.0 lb

## 2023-12-13 DIAGNOSIS — R079 Chest pain, unspecified: Secondary | ICD-10-CM | POA: Diagnosis not present

## 2023-12-13 DIAGNOSIS — K760 Fatty (change of) liver, not elsewhere classified: Secondary | ICD-10-CM

## 2023-12-13 DIAGNOSIS — K5730 Diverticulosis of large intestine without perforation or abscess without bleeding: Secondary | ICD-10-CM

## 2023-12-13 DIAGNOSIS — Z800 Family history of malignant neoplasm of digestive organs: Secondary | ICD-10-CM

## 2023-12-13 DIAGNOSIS — Z860100 Personal history of colon polyps, unspecified: Secondary | ICD-10-CM

## 2023-12-13 DIAGNOSIS — K219 Gastro-esophageal reflux disease without esophagitis: Secondary | ICD-10-CM

## 2023-12-13 DIAGNOSIS — Z860101 Personal history of adenomatous and serrated colon polyps: Secondary | ICD-10-CM

## 2023-12-13 DIAGNOSIS — Z8719 Personal history of other diseases of the digestive system: Secondary | ICD-10-CM

## 2023-12-13 NOTE — Progress Notes (Signed)
 12/13/2023 [name] [id number] 30-Sep-1945  Referring provider: Colene Dauphin, MD Primary GI doctor: Dr. Bridgett Camps  ASSESSMENT AND PLAN:  Diverticulitis seen on CT 11/01/2023, treated with Augmentin  Up to date on Colonoscopy 10/08/2023 with polyps, tics recall 09/2026 Responded well to augmentin , this has resolved other than rare AB pain, fleeting, alternating diarrhea/constipation No further diverticulitis symptoms Add on fiber for diverticulosis Call if any worsening symptoms  GERD CT cardiac with distal esophageal thickening and EG juctions EGD 10/08/2023 mild chronic inactive gastritis and focal intestinal metaplasia, pending H pylori addendum otherwise unremarkable, will call pathology Following with pulmonary for abnormal CT chest, pending high-resolution CT and PFTs - has some esophageal burning, intermittent dysphagia or slow transient - Will do treatment for possible LPR with alginate therapy - Diet information given - called pathology, they do not have report, will call me back - Treat H pylori if positive, consider trial of PPI  Hepatic steatosis seen on cardiac CT    Latest Ref Rng & Units 10/31/2023   11:32 AM 04/27/2023    8:53 AM 10/25/2022    8:51 AM  Hepatic Function  Total Protein 6.0 - 8.3 g/dL 7.6  7.9  8.4   Albumin 3.5 - 5.2 g/dL 4.3  4.4  4.6   AST 0 - 37 U/L 24  22  24    ALT 0 - 35 U/L 29  21  25    Alk Phosphatase 39 - 117 U/L 94  94  96   Total Bilirubin 0.2 - 1.2 mg/dL 0.4  0.5  0.5    Platelets 244.0  INR 01/18/2022 0.9  Normal CBC, LFTs Recheck CBC, LFTs every 6 months, weight loss discussed Consider elastography in 2-3 year  Chest pain Cardiac CT 09/2023 stable mild moderate CAD  Abnormal chest CT Has followed with pulmonary Dr. Dione Franks 12/12/2023 pending high-resolution CT scan and PFTs differential includes microaspiration from GERD with cough - pending H pylori , treat if positive - consider trial of PPI if negative  Personal history  of TA polyps Recall 09/2026, will discuss when we get close  Patient Care Team: Colene Dauphin, MD as PCP - General (Internal Medicine) Sonny Dust, MD (Inactive) as PCP - Cardiology (Cardiology) Pyrtle, Amber Bail, MD as Consulting Physician (Gastroenterology) Isidro Margo, DO as Consulting Physician (Family Medicine) Alvina Axon, MD as Consulting Physician (Ophthalmology)  HISTORY OF PRESENT ILLNESS:  Discussed the use of AI scribe software for clinical note transcription with the patient, who gave verbal consent to proceed.  History of Present Illness   [name] is a 78 year old female who presents for follow-up regarding H. pylori infection and reflux symptoms.  She is awaiting the results of an H. pylori test following an endoscopy and has not yet received the pathology report. She has a history of reflux symptoms, including a 'sensitive feeling' in her throat and awareness when swallowing certain foods. No current medication use for reflux. No hoarseness, cough, or sensation of mucus in her throat. Occasionally feels that large pills take longer to pass down her esophagus.  She has a history of diverticulitis and reports fluctuating bowel habits, ranging from normal bowel movements to constipation and loose stools. Occasionally experiences abdominal pain, but not to the extent that she requires medication. Notes that her stomach sometimes feels sore when pressed.  Experiences episodes of sweating, describing them as feeling clammy without a fever. These episodes occur randomly and are not associated with bowel  movements. She is on amlodipine  for blood pressure management and has noticed swelling in her legs. She has not been taking spironolactone  or dicyclomine  recently.  Her family history is significant for a sister who had stage four colon cancer, which was successfully treated.        She  reports that she has never smoked. She has been exposed to tobacco smoke.  She has never used smokeless tobacco. She reports current alcohol use. She reports that she does not use drugs.  RELEVANT GI HISTORY, IMAGING AND LABS: Results   LABS Hemoglobin: low (10/2023) Vitamin B12: normal (10/2023) Ferritin: normal (10/2023) Iron: 42 g/dL (16/1096)  DIAGNOSTIC Colonoscopy: normal     EGD 10/08/2023- LA Grade A reflux esophagitis with no bleeding. - No endoscopic esophageal abnormality to explain patient' s dysphagia. Esophagus dilated with 52 Fr Maloney. - Gastritis. Biopsied. - Duodenitis. - Normal second portion of the duodenum.  Colonoscopy - Three 3 to 5 mm polyps in the transverse colon, removed with a cold snare. Resected and retrieved. - One 4 mm polyp in the sigmoid colon, removed with a cold snare. Complete resection. Polyp tissue not retrieved. - Moderate diverticulosis in the sigmoid colon and in the distal descending colon.  Echo EF 60-65%moderate AR, no AS  CBC    Component Value Date/Time   WBC 5.9 10/31/2023 1132   RBC 3.94 10/31/2023 1132   HGB 11.6 (L) 10/31/2023 1132   HGB 12.1 08/15/2023 1553   HCT 34.8 (L) 10/31/2023 1132   HCT 37.3 08/15/2023 1553   PLT 244.0 10/31/2023 1132   PLT 252 08/15/2023 1553   MCV 88.3 10/31/2023 1132   MCV 91 08/15/2023 1553   MCH 29.4 08/15/2023 1553   MCH 29.2 01/18/2022 1554   MCHC 33.2 10/31/2023 1132   RDW 13.3 10/31/2023 1132   RDW 12.4 08/15/2023 1553   LYMPHSABS 1.7 10/31/2023 1132   LYMPHSABS WILL FOLLOW 02/10/2022 1131   MONOABS 0.6 10/31/2023 1132   EOSABS 0.2 10/31/2023 1132   EOSABS WILL FOLLOW 02/10/2022 1131   BASOSABS 0.1 10/31/2023 1132   BASOSABS WILL FOLLOW 02/10/2022 1131   Recent Labs    03/09/23 0826 08/15/23 1553 10/31/23 1132  HGB 12.2 12.1 11.6*    CMP     Component Value Date/Time   NA 141 10/31/2023 1132   NA 141 08/15/2023 1553   K 4.5 10/31/2023 1132   CL 106 10/31/2023 1132   CO2 27 10/31/2023 1132   GLUCOSE 109 (H) 10/31/2023 1132   GLUCOSE 99  07/23/2006 1101   BUN 22 10/31/2023 1132   BUN 22 08/15/2023 1553   CREATININE 0.99 10/31/2023 1132   CREATININE 1.09 (H) 03/20/2018 0900   CALCIUM  9.7 10/31/2023 1132   PROT 7.6 10/31/2023 1132   PROT 7.2 04/17/2022 0852   ALBUMIN 4.3 10/31/2023 1132   ALBUMIN 4.4 04/17/2022 0852   AST 24 10/31/2023 1132   ALT 29 10/31/2023 1132   ALKPHOS 94 10/31/2023 1132   BILITOT 0.4 10/31/2023 1132   BILITOT 0.3 04/17/2022 0852   GFRNONAA >60 01/18/2022 1554   GFRNONAA 50 (L) 03/20/2018 0900   GFRAA 66 08/02/2020 0947   GFRAA 58 (L) 03/20/2018 0900      Latest Ref Rng & Units 10/31/2023   11:32 AM 04/27/2023    8:53 AM 10/25/2022    8:51 AM  Hepatic Function  Total Protein 6.0 - 8.3 g/dL 7.6  7.9  8.4   Albumin 3.5 - 5.2 g/dL 4.3  4.4  4.6   AST 0 - 37 U/L 24  22  24    ALT 0 - 35 U/L 29  21  25    Alk Phosphatase 39 - 117 U/L 94  94  96   Total Bilirubin 0.2 - 1.2 mg/dL 0.4  0.5  0.5       Current Medications:    Current Outpatient Medications (Cardiovascular):    amLODipine  (NORVASC ) 5 MG tablet, Take 1 tablet (5 mg total) by mouth daily.   rosuvastatin  (CRESTOR ) 5 MG tablet, Take 5 mg by mouth daily.   spironolactone  (ALDACTONE ) 25 MG tablet, Take 0.5 tablets (12.5 mg total) by mouth daily.   EPINEPHrine  (EPIPEN  2-PAK) 0.3 mg/0.3 mL IJ SOAJ injection, Use as directed for severe allergic reactions (Patient not taking: Reported on 12/13/2023)   Current Outpatient Medications (Analgesics):    acetaminophen  (TYLENOL ) 500 MG tablet, Take 2 tablets (1,000 mg total) by mouth 3 (three) times daily as needed.   aspirin  81 MG tablet, Take 81 mg by mouth daily.  Current Outpatient Medications (Hematological):    vitamin B-12 (CYANOCOBALAMIN ) 1000 MCG tablet, Take 1,000 mcg by mouth 2 (two) times daily.   Current Outpatient Medications (Other):    Cholecalciferol (VITAMIN D -3) 25 MCG (1000 UT) CAPS, Take 10,000 Units by mouth daily.    diclofenac  Sodium (VOLTAREN ) 1 % GEL, Apply topically  4 (four) times daily as needed.    dicyclomine  (BENTYL ) 20 MG tablet, Take 1 tablet (20 mg total) by mouth every 8 (eight) hours as needed for spasms (AB pain).   MAGNESIUM  GLYCINATE PO, Take 240 mg by mouth daily.   Omega-3 Fatty Acids (FISH OIL BURP-LESS) 1000 MG CAPS, Takes 1280 mg 2 caps daiily   ondansetron  (ZOFRAN -ODT) 4 MG disintegrating tablet, Take 1 tablet (4 mg total) by mouth every 8 (eight) hours as needed for nausea or vomiting.   Ubiquinol 100 MG CAPS, Take 1 tablet by mouth daily.   vitamin E 400 UNIT capsule, Take 400 Units by mouth daily.   zinc  gluconate 50 MG tablet, Take 50 mg by mouth daily.  Medical History:  Past Medical History:  Diagnosis Date   Allergy     seasonal   Ankylosing spondylitis (HCC)    Anxiety    Arthritis    Bell palsy    yrs ago   Blood transfusion without reported diagnosis    CAD (coronary artery disease)    a. Coronary CT 10/2016 showed calcium  score in the 59th perecntile, moderate CAD in RCA/PDA and narrowing of the mid to distal LAD, with negative FFR.   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    GERD (gastroesophageal reflux disease)    history of CTS (carpal tunnel syndrome)    History of kidney stones    HLA B27 (HLA B27 positive)    Hx of colonic polyps    Hyperlipidemia    NMR 2005   Hypertension    Hypothyroidism    Incontinence    Internal hemorrhoids    Interstitial cystitis    Kidney stone    Menopause    Mild aortic insufficiency    Mild ascending aorta dilatation (HCC)    [facility]   Osteoarthritis    shoulder, ankles, hands   Osteopenia  [facility] [address]   Osteopenia    Pneumonia    2 x as child   Pre-diabetes    PSVT (paroxysmal supraventricular tachycardia) (HCC)    S/P total hysterectomy and  bilateral salpingo-oophorectomy 04/12/2014   Sinusitis    Sleep apnea    c-pap   Subjective visual disturbance of both eyes 12/30/2013   Tubular adenoma of colon    Vitamin D  deficiency    Xerostomia    and Xeroophthalmia(oral dryness)   Allergies:  Allergies  Allergen Reactions   Bactrim [Sulfamethoxazole-Trimethoprim] Other (See Comments)   Pentosan Polysulfate Other (See Comments)    Elevated liver count Elevated liver enzymes Elevated LFTs.......Aaron Aas elmiron  Other reaction(s): Not available elmiron   Pentosan Polysulfate Sodium Other (See Comments)    Elevated LFTs.......Aaron Aas elmiron    Promethazine Hcl Other (See Comments)    "jittery on the inside"   Sulfamethoxazole-Trimethoprim Nausea Only    Unknown reaction per pt, Pt thinks she may have felt sick to her stomach.    Lisinopril Cough   Losartan  Other (See Comments)    Pt states caused insomnia, tingling sensation in legs, lightheaded, stomach pain   Pork (Diagnostic) Nausea Only   Promethazine Hcl Anxiety     Surgical History:  She  has a past surgical history that includes Total abdominal hysterectomy w/ bilateral salpingoophorectomy; Bladder surgery; G 1 P 1; colonscopy; interstitial cystitis; Colonoscopy w/ polypectomy; Finger surgery (Left, 2004); Cystoscopy with retrograde pyelogram, ureteroscopy and stent placement (Left, 09/01/2013); Carpal tunnel release (Left, 12/22/2014); Shoulder arthroscopy (Left); Carpal tunnel release (Right, 02/18/2019); cataract excision (Bilateral, 2022); Excision of keloid (N/A, 03/09/2023); Colonoscopy (2019); and Colonoscopy with esophagogastroduodenoscopy (egd) (10/08/2023). Family History:  Her family history includes Allergic rhinitis in her sister and sister; Asthma in her mother; Breast cancer in her maternal aunt; Cirrhosis in her father; Colon cancer (age of onset: 63) in her sister; Coronary artery disease in her maternal grandfather and mother; Dementia in her mother; Diabetes in  her father, paternal aunt, and paternal grandmother; Environmental Allergies in her sister and sister; Food Allergy  in her sister; Hypertension in her mother; Renal cancer in her paternal aunt; Stroke in her mother.  REVIEW OF SYSTEMS  : All other systems reviewed and negative except where noted in the History of Present Illness.  PHYSICAL EXAM: BP 100/62 (BP Location: Left Arm, Patient Position: Sitting, Cuff Size: Large)   Pulse 64   Ht 5' 0.75" (1.543 m) Comment: height measured without shoes  Wt 166 lb (75.3 kg)   BMI 31.62 kg/m  Physical Exam   VITALS: BP- 100/62 GENERAL APPEARANCE: Well nourished, in no apparent distress. HEENT: No cervical lymphadenopathy, unremarkable thyroid , sclerae anicteric, conjunctiva pink. RESPIRATORY: Respiratory effort normal, breath sounds equal bilaterally without rales, rhonchi, or wheezing. CARDIO: Regular rate and rhythm with no murmurs, rubs, or gallops, peripheral pulses intact. ABDOMEN: Soft, non-distended, active bowel sounds in all four quadrants, mild epigastric discomfort, no tenderness to palpation, no rebound, no mass appreciated. RECTAL: Declines examination. MUSCULOSKELETAL: Full range of motion, normal gait, without edema. SKIN: Dry, intact without rashes or lesions. No jaundice. NEURO: Alert, oriented, no focal deficits. PSYCH: Cooperative, normal mood and affect.      Edmonia Gottron, PA-C 11:07 AM

## 2023-12-13 NOTE — Patient Instructions (Addendum)
 Silent reflux: Not all heartburn burns...Aaron AasAaron AasAaron Aas  What is LPR? Laryngopharyngeal reflux (LPR) or silent reflux is a condition in which acid that is made in the stomach travels up the esophagus (swallowing tube) and gets to the throat. Not everyone with reflux has a lot of heartburn or indigestion. In fact, many people with LPR never have heartburn. This is why LPR is called SILENT REFLUX, and the terms "Silent reflux" and "LPR" are often used interchangeably. Because LPR is silent, it is sometimes difficult to diagnose.  How can you tell if you have LPR?  Chronic hoarseness- Some people have hoarseness that comes and goes throat clearing  Cough It can cause shortness of breath and cause asthma like symptoms. a feeling of a lump in the throat  difficulty swallowing a problem with too much nose and throat drainage.  Some people will feel their esophagus spasm which feels like their heart beating hard and fast, this will usually be after a meal, at rest, or lying down at night.    How do I treat this? Treatment for LPR should be individualized, and your doctor will suggest the best treatment for you. Generally there are several treatments for LPR: changing habits and diet to reduce reflux,  medications to reduce stomach acid, and  surgery to prevent reflux. Most people with LPR need to modify how and when they eat, as well as take some medication, to get well. Sometimes, nonprescription liquid antacids, such as Maalox, Gelucil and Mylanta are recommended. When used, these antacids should be taken four times each day - one tablespoon one hour after each meal and before bedtime. Dietary and lifestyle changes alone are not often enough to control LPR - medications that reduce stomach acid are also usually needed. These must be prescribed by our doctor.   TIPS FOR REDUCING REFLUX AND LPR Control your LIFE-STYLE and your DIET! If you use tobacco, QUIT.  Smoking makes you reflux. After every  cigarette you have some LPR.  Don't wear clothing that is too tight, especially around the waist (trousers, corsets, belts).  Do not lie down just after eating...in fact, do not eat within three hours of bedtime.  You should be on a low-fat diet.  Limit your intake of red meat.  Limit your intake of butter.  Avoid fried foods.  Avoid chocolate  Avoid cheese.  Avoid eggs. Specifically avoid caffeine (especially coffee and tea), soda pop (especially cola) and mints.  Avoid alcoholic beverages, particularly in the evening.  Reflux Gourmet Rescue  It is an ALGINATE THERAPY which is the only intervention that works to safeguard the esophagus by creating a protective barrier that actually stops reflux from happening. -The general directions for use are as stated on the packaging: Take 1 teaspoon (5 ml), or more as needed or as directed by your physician, after meals and before bed. TRY IT AT LEAST BEFORE BED FOR 2-4 WEEKS -These general directions address the most common times for reflux to occur, but our Rescue products may be taken anytime. Some individuals may take our product preemptively, when they know they will suffer from reflux, or as needed - when discomfort arises. (If taken around food, it should be consumed last.) -You do not have to take 1 teaspoon (5 ml) of the product. While one teaspoon (5ml) may be the perfect average amount to relieve reflux suffering in some, others may require more or less. You may adjust the amount of Mint Chocolate Rescue and Vanilla Caramel Rescue to the  lowest amount necessary to meet your individual needs to improve your quality of life. -You may dilute the product if it is too viscous for you to consume. Keep in mind, however, that the thickness of the product was formulated to provide optimal coating and protection of your throat and esophagus. Though diluting the product is possible, it may reduce the protective function and/or length of action. -This can  be used in conjunction with reflux medications and lifestyle changes.  100% ALL-NATURAL  Paraben FREE, glycerin FREE, & potassium FREE  Made entirely from all-natural ingredients considered safe for children and during pregnancy  No known side effects  All-natural flavor Gluten FREE  Allergen FREE  Vegan  Can find more information here: [email or web address]   Diverticulosis Diverticulosis is a condition that develops when small pouches (diverticula) form in the wall of the large intestine (colon). The colon is where water is absorbed and stool (feces) is formed. The pouches form when the inside layer of the colon pushes through weak spots in the outer layers of the colon. You may have a few pouches or many of them. The pouches usually do not cause problems unless they become inflamed or infected. When this happens, the condition is called diverticulitis- this is left lower quadrant pain, diarrhea, fever, chills, nausea or vomiting.  If this occurs please call the office or go to the hospital. Sometimes these patches without inflammation can also have painless bleeding associated with them, if this happens please call the office or go to the hospital. Preventing constipation and increasing fiber can help reduce diverticula and prevent complications. Even if you feel you have a high-fiber diet, suggest getting on Benefiber or Cirtracel 2 times daily.   During an Acute Flare-Up (Clear Liquid to Low-Fiber Diet) The goal is to reduce irritation and let your colon rest.  Day 1-3: Clear Liquid Diet Water  Broth (chicken, beef, or vegetable)  Clear juices (apple, white grape - avoid citrus)  Ice pops without pulp or seeds  Gelatin (no fruit or seeds)  Tea or coffee (no cream or dairy)  After Symptoms Improve: Low-Fiber Diet Gradually transition to low-fiber foods for easier digestion.  Sample Foods White rice, pasta, or plain white bread  Cooked or  canned vegetables without skins or seeds (e.g., carrots, green beans, potatoes)  Eggs, fish, or poultry  Low-fiber cereals (like cornflakes)  Dairy (if tolerated)  Ripe bananas, melon, or canned fruit without seeds  Long-Term Maintenance: High-Fiber Diet (Once Fully Recovered) This helps prevent future flare-ups by keeping the bowel movements soft and regular.  High-Fiber Foods Fruits: Apples (peeled), pears, berries, prunes  Vegetables: Broccoli, spinach, zucchini, peas  Whole grains: Oatmeal, brown rice, quinoa, whole wheat bread  Legumes: Lentils, chickpeas, black beans (start slowly to avoid gas)  Nuts & seeds: Only if tolerated (research no longer restricts them, but if you feel they cause a flare do not eat them)  You will need to call the office back for your 4-6 month follow up. No Availabilities at this time  I appreciate the  opportunity to care for you  Thank You   [facility]

## 2023-12-17 ENCOUNTER — Ambulatory Visit: Payer: Self-pay | Admitting: Internal Medicine

## 2023-12-17 ENCOUNTER — Ambulatory Visit (HOSPITAL_BASED_OUTPATIENT_CLINIC_OR_DEPARTMENT_OTHER)
Admission: RE | Admit: 2023-12-17 | Discharge: 2023-12-17 | Disposition: A | Discharge status: Home / Self Care | Source: Ambulatory Visit | Attending: Internal Medicine | Admitting: Internal Medicine

## 2023-12-17 DIAGNOSIS — J984 Other disorders of lung: Secondary | ICD-10-CM | POA: Diagnosis not present

## 2023-12-17 DIAGNOSIS — R0602 Shortness of breath: Secondary | ICD-10-CM | POA: Diagnosis not present

## 2023-12-17 DIAGNOSIS — R9389 Abnormal findings on diagnostic imaging of other specified body structures: Secondary | ICD-10-CM | POA: Diagnosis not present

## 2023-12-17 DIAGNOSIS — R918 Other nonspecific abnormal finding of lung field: Secondary | ICD-10-CM | POA: Diagnosis not present

## 2023-12-17 DIAGNOSIS — J849 Interstitial pulmonary disease, unspecified: Secondary | ICD-10-CM | POA: Diagnosis not present

## 2023-12-19 ENCOUNTER — Other Ambulatory Visit: Payer: Self-pay | Admitting: Physician Assistant

## 2023-12-20 NOTE — Progress Notes (Signed)
 Addendum: Reviewed and agree with assessment and management plan. Asha Grumbine, Carie Caddy, MD

## 2024-01-02 ENCOUNTER — Ambulatory Visit: Payer: Self-pay | Admitting: Internal Medicine

## 2024-01-03 NOTE — Telephone Encounter (Signed)
 Please advise if appointment needs be moved up

## 2024-01-06 ENCOUNTER — Encounter: Payer: Self-pay | Admitting: Internal Medicine

## 2024-01-06 NOTE — Progress Notes (Unsigned)
 Subjective:    Patient ID: [name], female    DOB: Jan 27, 1946, 78 y.o.   MRN: [id number]     HPI [name] is here for follow up of her chronic medical problems.  3 and half months ago we started spironolactone  12.5 mg daily for hair loss.  Medications and allergies reviewed with patient and updated if appropriate.  Current Outpatient Medications on File Prior to Visit  Medication Sig Dispense Refill   acetaminophen  (TYLENOL ) 500 MG tablet Take 2 tablets (1,000 mg total) by mouth 3 (three) times daily as needed.     amLODipine  (NORVASC ) 5 MG tablet TAKE 1 TABLET (5 MG TOTAL) BY MOUTH DAILY. 90 tablet 3   aspirin  81 MG tablet Take 81 mg by mouth daily.     Cholecalciferol (VITAMIN D -3) 25 MCG (1000 UT) CAPS Take 10,000 Units by mouth daily.      diclofenac  Sodium (VOLTAREN ) 1 % GEL Apply topically 4 (four) times daily as needed.      dicyclomine  (BENTYL ) 20 MG tablet Take 1 tablet (20 mg total) by mouth every 8 (eight) hours as needed for spasms (AB pain). 30 tablet 0   EPINEPHrine  (EPIPEN  2-PAK) 0.3 mg/0.3 mL IJ SOAJ injection Use as directed for severe allergic reactions (Patient not taking: Reported on 12/13/2023) 2 each 1   MAGNESIUM  GLYCINATE PO Take 240 mg by mouth daily.     Omega-3 Fatty Acids (FISH OIL BURP-LESS) 1000 MG CAPS Takes 1280 mg 2 caps daiily 60 capsule 0   ondansetron  (ZOFRAN -ODT) 4 MG disintegrating tablet Take 1 tablet (4 mg total) by mouth every 8 (eight) hours as needed for nausea or vomiting. 20 tablet 0   rosuvastatin  (CRESTOR ) 5 MG tablet Take 5 mg by mouth daily.     spironolactone  (ALDACTONE ) 25 MG tablet Take 0.5 tablets (12.5 mg total) by mouth daily. 45 tablet 1   Ubiquinol 100 MG CAPS Take 1 tablet by mouth daily.     vitamin B-12 (CYANOCOBALAMIN ) 1000 MCG tablet Take 1,000 mcg by mouth 2 (two) times daily.      vitamin E 400 UNIT capsule Take 400 Units by mouth daily.     zinc  gluconate 50 MG tablet Take 50 mg by mouth daily.     No current  facility-administered medications on file prior to visit.     Review of Systems     Objective:  There were no vitals filed for this visit. BP Readings from Last 3 Encounters:  12/13/23 100/62  12/12/23 108/62  11/26/23 110/78   Wt Readings from Last 3 Encounters:  12/13/23 166 lb (75.3 kg)  12/12/23 167 lb 3.2 oz (75.8 kg)  11/26/23 162 lb (73.5 kg)   There is no height or weight on file to calculate BMI.    Physical Exam     Lab Results  Component Value Date   WBC 5.9 10/31/2023   HGB 11.6 (L) 10/31/2023   HCT 34.8 (L) 10/31/2023   PLT 244.0 10/31/2023   GLUCOSE 109 (H) 10/31/2023   CHOL 116 04/27/2023   TRIG 94.0 04/27/2023   HDL 59.00 04/27/2023   LDLDIRECT 114.0 05/02/2016   LDLCALC 38 04/27/2023   ALT 29 10/31/2023   AST 24 10/31/2023   NA 141 10/31/2023   K 4.5 10/31/2023   CL 106 10/31/2023   CREATININE 0.99 10/31/2023   BUN 22 10/31/2023   CO2 27 10/31/2023   TSH 2.410 08/15/2023   INR 0.9 01/18/2022   HGBA1C  6.4 04/27/2023   MICROALBUR 1.2 10/25/2022     Assessment & Plan:    See Problem List for Assessment and Plan of chronic medical problems.

## 2024-01-07 ENCOUNTER — Ambulatory Visit (INDEPENDENT_AMBULATORY_CARE_PROVIDER_SITE_OTHER): Admitting: Internal Medicine

## 2024-01-07 ENCOUNTER — Other Ambulatory Visit (INDEPENDENT_AMBULATORY_CARE_PROVIDER_SITE_OTHER)

## 2024-01-07 VITALS — BP 104/78 | HR 70 | Temp 98.0°F | Ht 60.75 in | Wt 164.0 lb

## 2024-01-07 DIAGNOSIS — L659 Nonscarring hair loss, unspecified: Secondary | ICD-10-CM

## 2024-01-07 DIAGNOSIS — R61 Generalized hyperhidrosis: Secondary | ICD-10-CM | POA: Insufficient documentation

## 2024-01-07 DIAGNOSIS — K2970 Gastritis, unspecified, without bleeding: Secondary | ICD-10-CM | POA: Insufficient documentation

## 2024-01-07 DIAGNOSIS — I10 Essential (primary) hypertension: Secondary | ICD-10-CM | POA: Diagnosis not present

## 2024-01-07 DIAGNOSIS — E1169 Type 2 diabetes mellitus with other specified complication: Secondary | ICD-10-CM

## 2024-01-07 MED ORDER — ESOMEPRAZOLE MAGNESIUM 40 MG PO CPDR: 40.0000 mg | DELAYED_RELEASE_CAPSULE | Freq: Every day | ORAL | 1 refills | Status: DC

## 2024-01-07 NOTE — Patient Instructions (Addendum)
      Blood work was ordered.       Medications changes include :   nexium 40 mg daily - take 30 minute prior to a meal     Return in about 6 months (around 07/08/2024) for follow up.

## 2024-01-07 NOTE — Assessment & Plan Note (Signed)
 Subacute Continues to lose hair-stable Referral already ordered for dermatology Did not start spironolactone  12.5 mg daily BMP, TFTs

## 2024-01-07 NOTE — Assessment & Plan Note (Signed)
 Chronic Blood pressure well-controlled-on the low side at times. Continue amlodipine  5 mg daily, Coreg  3.125 mg twice daily and valsartan  40 mg daily Continue to monitor BP at home

## 2024-01-07 NOTE — Assessment & Plan Note (Addendum)
 Subacute Has been going on for a while Occurs at rest or with activity Feels clammy all over and sweating and head, back of neck, lower back, groin area TSH has been normal, but will recheck TFTs Check A1c No associated symptoms Can discuss with cardiology to make sure it is not cardiac Will check metanephrines-plasma and urine Monitor BP at home-May need to decrease medication

## 2024-01-07 NOTE — Assessment & Plan Note (Signed)
 Recent EGD showed esophagitis and gastritis H. pylori test negative Not currently taking any medication for it Start Nexium 40 mg daily-take 30 minutes prior to meal She does have follow-up with GI to discuss further

## 2024-01-07 NOTE — Assessment & Plan Note (Signed)
 Chronic Lab Results  Component Value Date   HGBA1C 6.4 04/27/2023   Sugars have been controlled with lifestyle Check A1c

## 2024-01-08 LAB — HEMOGLOBIN A1C: Hgb A1c MFr Bld: 6.6 % — ABNORMAL HIGH (ref 4.6–6.5)

## 2024-01-10 ENCOUNTER — Ambulatory Visit: Payer: Self-pay | Admitting: Internal Medicine

## 2024-01-10 LAB — T4, FREE: Free T4: 0.68 ng/dL (ref 0.60–1.60)

## 2024-01-10 LAB — TSH: TSH: 2.85 u[IU]/mL (ref 0.35–5.50)

## 2024-01-10 LAB — T3, FREE: T3, Free: 3.1 pg/mL (ref 2.3–4.2)

## 2024-01-14 ENCOUNTER — Ambulatory Visit (INDEPENDENT_AMBULATORY_CARE_PROVIDER_SITE_OTHER): Admitting: Internal Medicine

## 2024-01-14 ENCOUNTER — Encounter: Payer: Self-pay | Admitting: Internal Medicine

## 2024-01-14 VITALS — BP 108/78 | HR 78 | Temp 98.7°F | Ht 60.75 in | Wt 162.0 lb

## 2024-01-14 DIAGNOSIS — R051 Acute cough: Secondary | ICD-10-CM

## 2024-01-14 DIAGNOSIS — J069 Acute upper respiratory infection, unspecified: Secondary | ICD-10-CM | POA: Insufficient documentation

## 2024-01-14 LAB — POC COVID19 BINAXNOW: SARS Coronavirus 2 Ag: NEGATIVE

## 2024-01-14 MED ORDER — HYDROCODONE BIT-HOMATROP MBR 5-1.5 MG/5ML PO SOLN: 5.0000 mL | Freq: Three times a day (TID) | ORAL | 0 refills | Status: DC | PRN

## 2024-01-14 MED ORDER — AMOXICILLIN-POT CLAVULANATE 875-125 MG PO TABS: 1.0000 | ORAL_TABLET | Freq: Two times a day (BID) | ORAL | 0 refills | Status: AC

## 2024-01-14 NOTE — Progress Notes (Signed)
 Subjective:    Patient ID: [name], female    DOB: 1945/11/10, 78 y.o.   MRN: [id number]      HPI [name] is here for  Chief Complaint  Patient presents with   Cough    Cough that hurts; ears are hurting; Not sleeping at all; Discomfort in chest with coughing; Deep cough started around Saturday    She is here for an acute visit for cold symptoms.   Her symptoms started about one week ago.  Her daughter lives with her and was recently sick and is still getting over her infection.  She was on antibiotics.  She is experiencing nasal congestion, ear pain, PND, sinus pressure, sore throat, watery eye, cough, shortness of breath, mild wheezing at times, lightheadedness.  She did have a low-grade fever.  She has not had any headaches.  She has tried taking over-the-counter medication     Medications and allergies reviewed with patient and updated if appropriate.  Current Outpatient Medications on File Prior to Visit  Medication Sig Dispense Refill   acetaminophen  (TYLENOL ) 500 MG tablet Take 2 tablets (1,000 mg total) by mouth 3 (three) times daily as needed.     amLODipine  (NORVASC ) 5 MG tablet TAKE 1 TABLET (5 MG TOTAL) BY MOUTH DAILY. 90 tablet 3   aspirin  81 MG tablet Take 81 mg by mouth daily.     carvedilol  (COREG ) 3.125 MG tablet Take 3.125 mg by mouth 2 (two) times daily.     Cholecalciferol (VITAMIN D -3) 25 MCG (1000 UT) CAPS Take 10,000 Units by mouth daily.      diclofenac  Sodium (VOLTAREN ) 1 % GEL Apply topically 4 (four) times daily as needed.      dicyclomine  (BENTYL ) 20 MG tablet Take 1 tablet (20 mg total) by mouth every 8 (eight) hours as needed for spasms (AB pain). 30 tablet 0   EPINEPHrine  (EPIPEN  2-PAK) 0.3 mg/0.3 mL IJ SOAJ injection Use as directed for severe allergic reactions 2 each 1   esomeprazole  (NEXIUM ) 40 MG capsule Take 1 capsule (40 mg total) by mouth daily. Take 30 minutes prior to a meal 90 capsule 1   MAGNESIUM  GLYCINATE PO Take 240 mg by  mouth daily.     Omega-3 Fatty Acids (FISH OIL BURP-LESS) 1000 MG CAPS Takes 1280 mg 2 caps daiily 60 capsule 0   ondansetron  (ZOFRAN -ODT) 4 MG disintegrating tablet Take 1 tablet (4 mg total) by mouth every 8 (eight) hours as needed for nausea or vomiting. 20 tablet 0   rosuvastatin  (CRESTOR ) 5 MG tablet Take 5 mg by mouth daily.     spironolactone  (ALDACTONE ) 25 MG tablet Take 0.5 tablets (12.5 mg total) by mouth daily. 45 tablet 1   Ubiquinol 100 MG CAPS Take 1 tablet by mouth daily.     vitamin B-12 (CYANOCOBALAMIN ) 1000 MCG tablet Take 1,000 mcg by mouth 2 (two) times daily.      vitamin E 400 UNIT capsule Take 400 Units by mouth daily.     zinc  gluconate 50 MG tablet Take 50 mg by mouth daily.     No current facility-administered medications on file prior to visit.    Review of Systems  Constitutional:  Negative for fever (low grade).  HENT:  Positive for congestion, ear pain, postnasal drip, sinus pressure (mild) and sore throat (uncomfortable).   Eyes:  Positive for discharge (left ear- watery).  Respiratory:  Positive for cough, shortness of breath and wheezing (very mild at times).  Gastrointestinal:  Positive for nausea (mild).  Neurological:  Positive for light-headedness. Negative for headaches.       Objective:   Vitals:   01/14/24 1540  BP: 108/78  Pulse: 78  Temp: 98.7 F (37.1 C)  SpO2: 97%   BP Readings from Last 3 Encounters:  01/14/24 108/78  01/07/24 104/78  12/13/23 100/62   Wt Readings from Last 3 Encounters:  01/14/24 162 lb (73.5 kg)  01/07/24 164 lb (74.4 kg)  12/13/23 166 lb (75.3 kg)   Body mass index is 30.86 kg/m.    Physical Exam Constitutional:      General: She is not in acute distress.    Appearance: Normal appearance. She is not ill-appearing.  HENT:     Head: Normocephalic and atraumatic.     Right Ear: Tympanic membrane, ear canal and external ear normal.     Left Ear: Tympanic membrane, ear canal and external ear normal.      Mouth/Throat:     Mouth: Mucous membranes are moist.     Pharynx: No oropharyngeal exudate or posterior oropharyngeal erythema.   Eyes:     Conjunctiva/sclera: Conjunctivae normal.    Cardiovascular:     Rate and Rhythm: Normal rate and regular rhythm.  Pulmonary:     Effort: Pulmonary effort is normal. No respiratory distress.     Breath sounds: Normal breath sounds. No wheezing or rales.   Musculoskeletal:     Cervical back: Neck supple. No tenderness.  Lymphadenopathy:     Cervical: No cervical adenopathy.   Skin:    General: Skin is warm and dry.   Neurological:     Mental Status: She is alert.            Assessment & Plan:    See Problem List for Assessment and Plan of chronic medical problems.

## 2024-01-14 NOTE — Patient Instructions (Addendum)
         Medications changes include :   Augmentin  for 10 days, cough syrup      Return if symptoms worsen or fail to improve.

## 2024-01-14 NOTE — Assessment & Plan Note (Signed)
 Acute Symptoms concerning for possible bacterial cause Start Augmentin  875-125 mg twice daily x 10 days Hycodan cough syrup at bedtime as needed Advised Coricidin cold medications Increase fluids Call if no improvement

## 2024-01-16 NOTE — Addendum Note (Signed)
 Addended by: Katherene Pals on: 01/16/2024 04:10 PM   Modules accepted: Orders

## 2024-01-17 ENCOUNTER — Encounter: Payer: Self-pay | Admitting: Internal Medicine

## 2024-01-17 MED ORDER — PREDNISONE 20 MG PO TABS: 40.0000 mg | ORAL_TABLET | Freq: Every day | ORAL | 0 refills | Status: AC

## 2024-01-17 NOTE — Addendum Note (Signed)
 Addended by: Colene Dauphin on: 01/17/2024 07:04 PM   Modules accepted: Orders

## 2024-01-21 ENCOUNTER — Other Ambulatory Visit: Payer: Self-pay | Admitting: Internal Medicine

## 2024-01-21 MED ORDER — ROSUVASTATIN CALCIUM 5 MG PO TABS: 5.0000 mg | ORAL_TABLET | Freq: Every day | ORAL | 3 refills | Status: AC

## 2024-01-28 ENCOUNTER — Encounter: Payer: Self-pay | Admitting: Internal Medicine

## 2024-01-28 ENCOUNTER — Ambulatory Visit (INDEPENDENT_AMBULATORY_CARE_PROVIDER_SITE_OTHER): Admitting: Internal Medicine

## 2024-01-28 ENCOUNTER — Ambulatory Visit (HOSPITAL_BASED_OUTPATIENT_CLINIC_OR_DEPARTMENT_OTHER): Admitting: Internal Medicine

## 2024-01-28 VITALS — BP 128/82 | HR 60 | Ht 62.0 in | Wt 163.6 lb

## 2024-01-28 DIAGNOSIS — J84112 Idiopathic pulmonary fibrosis: Secondary | ICD-10-CM | POA: Diagnosis not present

## 2024-01-28 DIAGNOSIS — R9389 Abnormal findings on diagnostic imaging of other specified body structures: Secondary | ICD-10-CM

## 2024-01-28 DIAGNOSIS — R0602 Shortness of breath: Secondary | ICD-10-CM

## 2024-01-28 LAB — PULMONARY FUNCTION TEST
DL/VA % pred: 82 %
DL/VA: 3.45 ml/min/mmHg/L
DLCO cor % pred: 71 %
DLCO cor: 12.01 ml/min/mmHg
DLCO unc % pred: 71 %
DLCO unc: 12.01 ml/min/mmHg
FEF 25-75 Post: 3.57 L/s
FEF 25-75 Pre: 2.34 L/s
FEF2575-%Change-Post: 52 %
FEF2575-%Pred-Post: 280 %
FEF2575-%Pred-Pre: 183 %
FEV1-%Change-Post: 10 %
FEV1-%Pred-Post: 114 %
FEV1-%Pred-Pre: 103 %
FEV1-Post: 1.91 L
FEV1-Pre: 1.73 L
FEV1FVC-%Change-Post: 0 %
FEV1FVC-%Pred-Pre: 117 %
FEV6-%Change-Post: 10 %
FEV6-%Pred-Post: 103 %
FEV6-%Pred-Pre: 93 %
FEV6-Post: 2.2 L
FEV6-Pre: 1.98 L
FEV6FVC-%Pred-Post: 106 %
FEV6FVC-%Pred-Pre: 106 %
FVC-%Change-Post: 10 %
FVC-%Pred-Post: 97 %
FVC-%Pred-Pre: 87 %
FVC-Post: 2.2 L
FVC-Pre: 1.98 L
Post FEV1/FVC ratio: 87 %
Post FEV6/FVC ratio: 100 %
Pre FEV1/FVC ratio: 87 %
Pre FEV6/FVC Ratio: 100 %
RV % pred: 85 %
RV: 1.88 L
TLC % pred: 89 %
TLC: 4.1 L

## 2024-01-28 NOTE — Patient Instructions (Addendum)
 It was a pleasure to see you today!  Please schedule follow up with myself in 3 months.  If my schedule is not open yet, we will contact you with a reminder closer to that time. Please call [phone] if you haven't heard from us  a month before, and always call us  sooner if issues or concerns arise. You can also send us  a message through MyChart, but but aware that this is not to be used for urgent issues and it may take up to 5-7 days to receive a reply. Please be aware that you will likely be able to view your results before I have a chance to respond to them. Please give us  5 business days to respond to any non-urgent results.   Your breathing testing shows normal pulmonary function.  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  IPF stands for idiopathic pulmonary fibrosis.  Currently I am not sure if you have this condition or not.  I am going to present your case in our multidisciplinary monthly conference for patients with possible ILD(interstitial lung disease.)  I will be in touch with you once I have the consensus of the committee.  The good news is that the changes are mild, not currently affecting your lung function, and if you do have this condition it is very early.  We talked briefly about antifibrotic therapy which are medications we can use to slow the progression of disease but not reverse or cure it.  We may consider starting these medications versus watchful waiting with repeat breathing testing to monitor the progression or change.  Reliable websites for more information are the [employer], television[employer]film set, American [employer] Association, [employer], [facility].

## 2024-01-28 NOTE — Patient Instructions (Signed)
 Full PFT performed today.

## 2024-01-28 NOTE — Progress Notes (Signed)
 Full PFT performed today.

## 2024-01-28 NOTE — Progress Notes (Signed)
 [name]    [id number]    09/10/1945  Primary Care Physician:Burns, Glade [name], MD Date of Appointment: 01/28/2024 Established Patient Visit  Chief complaint:   Chief Complaint  Patient presents with   Follow-up    Pft results     HPI: [name] is a 78 y.o. woman with abnormal CT Chest concerning for ILD  Interval Updates: Here for follow up after CT Chest High Resolution as well as PFT.   Does have some baseline dyspnea with exertion.  No fevers, chills night sweats weight loss.   Had recent URI  Social history    I have reviewed the patient's family social and past medical history and updated as appropriate.   Past Medical History:  Diagnosis Date   Allergy     seasonal   Ankylosing spondylitis (HCC)    Anxiety    Arthritis    Bell palsy    yrs ago   Blood transfusion without reported diagnosis    CAD (coronary artery disease)    a. Coronary CT 10/2016 showed calcium  score in the 59th perecntile, moderate CAD in RCA/PDA and narrowing of the mid to distal LAD, with negative FFR.   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    GERD (gastroesophageal reflux disease)    history of CTS (carpal tunnel syndrome)    History of kidney stones    HLA B27 (HLA B27 positive)    Hx of colonic polyps    Hyperlipidemia    NMR 2005   Hypertension    Hypothyroidism    Incontinence    Internal hemorrhoids    Interstitial cystitis    Kidney stone    Menopause    Mild aortic insufficiency    Mild ascending aorta dilatation (HCC)    followed by cardiology  dayna dunn pa   Osteoarthritis    shoulder, ankles, hands   Osteopenia    [facility] [address]   Osteopenia    Pneumonia    2 x as child   Pre-diabetes    PSVT (paroxysmal supraventricular tachycardia) (HCC)    S/P total  hysterectomy and bilateral salpingo-oophorectomy 04/12/2014   Sinusitis    Sleep apnea    c-pap   Subjective visual disturbance of both eyes 12/30/2013   Tubular adenoma of colon    Vitamin D  deficiency    Xerostomia    and Xeroophthalmia(oral dryness)    Past Surgical History:  Procedure Laterality Date   BLADDER SURGERY     for incontinence   CARPAL TUNNEL RELEASE Left 12/22/2014   Procedure: LEFT CARPAL TUNNEL RELEASE;  Surgeon: Arley Curia, MD;  Location: [facility];  Service: Orthopedics;  Laterality: Left;   CARPAL TUNNEL RELEASE Right 02/18/2019   Procedure: RIGHT CARPAL TUNNEL RELEASE;  Surgeon: Curia Arley, MD;  Location: [facility];  Service: Orthopedics;  Laterality: Right;   cataract excision Bilateral 2022   COLONOSCOPY  2019   COLONOSCOPY W/ POLYPECTOMY     adenomatous poyp 04-2008   COLONOSCOPY WITH ESOPHAGOGASTRODUODENOSCOPY (EGD)  10/08/2023   colonscopy     tics 10-2002   CYSTOSCOPY WITH RETROGRADE PYELOGRAM, URETEROSCOPY AND STENT PLACEMENT Left 09/01/2013   Procedure: CYSTOSCOPY WITH RETROGRADE PYELOGRAM, AND LEFT  STENT PLACEMENT;  Surgeon: Toribio Neysa Repine, MD;  Location: [facility];  Service: Urology;  Laterality: Left;   EXCISION OF KELOID N/A 03/09/2023   Procedure: EXCISIONIONAL BIOPSY SEBACEOUS CYST LEFT BUTTOCK;  Surgeon: Debby Hila, MD;  Location: [facility];  Service: General;  Laterality: N/A;   FINGER SURGERY Left 2004   2nd finger   G 1 P 1     interstitial cystitis     with clot in catheter   SHOULDER ARTHROSCOPY Left    TOTAL ABDOMINAL HYSTERECTOMY W/ BILATERAL SALPINGOOPHORECTOMY     dysfunctional menses    Family History  Problem Relation Age of Onset   Coronary artery disease Mother    Hypertension Mother    Asthma Mother    Dementia Mother    Stroke Mother        mini cva   Diabetes Father    Cirrhosis Father        non alcoholic   Colon cancer Sister 17   Allergic rhinitis  Sister    Environmental Allergies Sister    Allergic rhinitis Sister    Food Allergy  Sister    Environmental Allergies Sister    Coronary artery disease Maternal Grandfather    Diabetes Paternal Grandmother    Breast cancer Maternal Aunt    Renal cancer Paternal Aunt        x 2   Diabetes Paternal Aunt    Esophageal cancer Neg Hx    Stomach cancer Neg Hx    Colon polyps Neg Hx     Social History   Occupational History   Occupation: retired   Occupation: Runner, broadcasting/film/video    Comment: Kindergarten  Tobacco Use   Smoking status: Never    Passive exposure: Past   Smokeless tobacco: Never  Vaping Use   Vaping status: Never Used  Substance and Sexual Activity   Alcohol use: Yes    Comment: rare   Drug use: No   Sexual activity: Not Currently     Physical Exam: Blood pressure 128/82, height 5' 2 (1.575 m), weight 163 lb 9.6 oz (74.2 kg), SpO2 97%.  Gen:      No acute distress ENT:  \no nasal polyps, mucus membranes moist Lungs:    No increased respiratory effort, symmetric chest wall excursion, clear to auscultation bilaterally, \no wheezes or crackles CV:         Regular rate and rhythm; no murmurs, rubs, or gallops.  No pedal edema   Data Reviewed: Imaging: I have personally reviewed the CT Chest May 2025 - mild lower lobe sub-pleural reticulation, no honeycombing, fibrosis, or ground glass.   PFTs:     Latest Ref Rng & Units 01/28/2024    9:14 AM  PFT Results  FVC-Pre L 1.98  P  FVC-Predicted Pre % 87  P  FVC-Post L 2.20  P  FVC-Predicted Post % 97  P  Pre FEV1/FVC % % 87  P  Post FEV1/FCV % % 87  P  FEV1-Pre L 1.73  P  FEV1-Predicted Pre % 103  P  FEV1-Post L 1.91  P  DLCO uncorrected ml/min/mmHg 12.01  P  DLCO UNC% % 71  P  DLCO corrected ml/min/mmHg 12.01  P  DLCO COR %Predicted % 71  P  DLVA Predicted % 82  P  TLC L 4.10  P  TLC % Predicted % 89  P  RV % Predicted % 85  P    P Preliminary result   I have  personally reviewed the patient's PFTs and  normal pulmonary function  Labs: Lab Results  Component Value Date   NA 141 10/31/2023   K 4.5 10/31/2023   CO2 27 10/31/2023   GLUCOSE 109 (H) 10/31/2023   BUN 22 10/31/2023   CREATININE 0.99 10/31/2023   CALCIUM  9.7 10/31/2023   GFR 54.15 (L) 10/31/2023   EGFR 53 (L) 08/15/2023   GFRNONAA >60 01/18/2022   Lab Results  Component Value Date   WBC 5.9 10/31/2023   HGB 11.6 (L) 10/31/2023   HCT 34.8 (L) 10/31/2023   MCV 88.3 10/31/2023   PLT 244.0 10/31/2023    Immunization status: Immunization History  Administered Date(s) Administered   Fluad Quad(high Dose 65+) 04/02/2019, 04/20/2020, 04/17/2022   Fluad Trivalent(High Dose 65+) 08/22/2023   Fluzone Influenza virus vaccine,trivalent (IIV3), split virus 08/01/2003, 05/29/2007, 04/15/2008, 04/30/2009, 05/03/2010   Influenza Split 05/09/2011, 05/07/2012   Influenza Whole 08/01/2003, 05/29/2007, 04/15/2008, 04/30/2009, 05/03/2010   Influenza, High Dose Seasonal PF 05/01/2013, 04/18/2014, 04/13/2016, 05/02/2017, 04/15/2018   Influenza,inj,Quad PF,6+ Mos 05/21/2015   Influenza-Unspecified 05/09/2011, 05/07/2012, 05/21/2015, 05/02/2017   PFIZER(Purple Top)SARS-COV-2 Vaccination 09/13/2019, 10/08/2019, 06/04/2020   Pneumococcal Conjugate-13 02/25/2015   Pneumococcal Polysaccharide-23 07/31/2005, 05/27/2012   Td 06/27/2010   Tdap 08/17/2020   Zoster Recombinant(Shingrix) 06/15/2017, 08/20/2017   Zoster, Live 07/19/2012   Zoster, Unspecified 06/15/2017, 08/20/2017    External Records Personally Reviewed:   Assessment:  Abnormal CT Chest - 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  IPF stands for idiopathic pulmonary fibrosis.  Currently I am not sure if you have this condition or not.  I am going to present your case in our multidisciplinary monthly  conference for patients with possible ILD(interstitial lung disease.)  I will be in touch with you once I have the consensus of the committee.  The good news is that the changes are mild, not currently affecting your lung function, and if you do have this condition it is very early.  We talked briefly about antifibrotic therapy which are medications we can use to slow the progression of disease but not reverse or cure it.  We may consider starting these medications versus watchful waiting with repeat breathing testing to monitor the progression or change.  Reliable websites for more information are the Designer, television/film set, American lung Association, [employer], [facility].  I spent 30 minutes in the care of this patient today including pre-charting, chart review, review of results, face-to-face care, coordination of care and communication with consultants etc.).   Return to Care: Return in about 3 months (around 04/29/2024).   Verdon Gore, MD Pulmonary and Critical Care Medicine [facility] [place] [phone]

## 2024-02-01 ENCOUNTER — Emergency Department (HOSPITAL_BASED_OUTPATIENT_CLINIC_OR_DEPARTMENT_OTHER)

## 2024-02-01 ENCOUNTER — Other Ambulatory Visit: Payer: Self-pay

## 2024-02-01 ENCOUNTER — Encounter (HOSPITAL_BASED_OUTPATIENT_CLINIC_OR_DEPARTMENT_OTHER): Payer: Self-pay | Admitting: Emergency Medicine

## 2024-02-01 ENCOUNTER — Emergency Department (HOSPITAL_BASED_OUTPATIENT_CLINIC_OR_DEPARTMENT_OTHER)
Admission: EM | Admit: 2024-02-01 | Discharge: 2024-02-01 | Disposition: A | Discharge status: Home / Self Care | Attending: Emergency Medicine | Admitting: Emergency Medicine

## 2024-02-01 DIAGNOSIS — I10 Essential (primary) hypertension: Secondary | ICD-10-CM | POA: Diagnosis not present

## 2024-02-01 DIAGNOSIS — R110 Nausea: Secondary | ICD-10-CM | POA: Insufficient documentation

## 2024-02-01 DIAGNOSIS — K5730 Diverticulosis of large intestine without perforation or abscess without bleeding: Secondary | ICD-10-CM | POA: Diagnosis not present

## 2024-02-01 DIAGNOSIS — Z79899 Other long term (current) drug therapy: Secondary | ICD-10-CM | POA: Insufficient documentation

## 2024-02-01 DIAGNOSIS — I2510 Atherosclerotic heart disease of native coronary artery without angina pectoris: Secondary | ICD-10-CM | POA: Diagnosis not present

## 2024-02-01 DIAGNOSIS — Z7982 Long term (current) use of aspirin: Secondary | ICD-10-CM | POA: Insufficient documentation

## 2024-02-01 DIAGNOSIS — R109 Unspecified abdominal pain: Secondary | ICD-10-CM | POA: Insufficient documentation

## 2024-02-01 LAB — BASIC METABOLIC PANEL WITH GFR
Anion gap: 13 (ref 5–15)
BUN: 17 mg/dL (ref 8–23)
CO2: 24 mmol/L (ref 22–32)
Calcium: 9.8 mg/dL (ref 8.9–10.3)
Chloride: 106 mmol/L (ref 98–111)
Creatinine, Ser: 1.13 mg/dL — ABNORMAL HIGH (ref 0.44–1.00)
GFR, Estimated: 49 mL/min — ABNORMAL LOW (ref >60)
Glucose, Bld: 131 mg/dL — ABNORMAL HIGH (ref 70–99)
Potassium: 4.1 mmol/L (ref 3.5–5.1)
Sodium: 143 mmol/L (ref 135–145)

## 2024-02-01 LAB — HEPATIC FUNCTION PANEL
ALT: 42 U/L (ref 0–44)
AST: 37 U/L (ref 15–41)
Albumin: 4.5 g/dL (ref 3.5–5.0)
Alkaline Phosphatase: 113 U/L (ref 38–126)
Bilirubin, Direct: 0.1 mg/dL (ref 0.0–0.2)
Indirect Bilirubin: 0.2 mg/dL — ABNORMAL LOW (ref 0.3–0.9)
Total Bilirubin: 0.3 mg/dL (ref 0.0–1.2)
Total Protein: 7.5 g/dL (ref 6.5–8.1)

## 2024-02-01 LAB — CBC WITH DIFFERENTIAL/PLATELET
Abs Immature Granulocytes: 0.01 K/uL (ref 0.00–0.07)
Basophils Absolute: 0.1 K/uL (ref 0.0–0.1)
Basophils Relative: 1 %
Eosinophils Absolute: 0.3 K/uL (ref 0.0–0.5)
Eosinophils Relative: 4 %
HCT: 36 % (ref 36.0–46.0)
Hemoglobin: 11.7 g/dL — ABNORMAL LOW (ref 12.0–15.0)
Immature Granulocytes: 0 %
Lymphocytes Relative: 23 %
Lymphs Abs: 1.6 K/uL (ref 0.7–4.0)
MCH: 28.8 pg (ref 26.0–34.0)
MCHC: 32.5 g/dL (ref 30.0–36.0)
MCV: 88.7 fL (ref 80.0–100.0)
Monocytes Absolute: 0.5 K/uL (ref 0.1–1.0)
Monocytes Relative: 8 %
Neutro Abs: 4.3 K/uL (ref 1.7–7.7)
Neutrophils Relative %: 64 %
Platelets: 236 K/uL (ref 150–400)
RBC: 4.06 MIL/uL (ref 3.87–5.11)
RDW: 13.2 % (ref 11.5–15.5)
WBC: 6.8 K/uL (ref 4.0–10.5)
nRBC: 0 % (ref 0.0–0.2)

## 2024-02-01 LAB — URINALYSIS, ROUTINE W REFLEX MICROSCOPIC
Bilirubin Urine: NEGATIVE
Glucose, UA: NEGATIVE mg/dL
Ketones, ur: NEGATIVE mg/dL
Nitrite: NEGATIVE
Protein, ur: NEGATIVE mg/dL
Specific Gravity, Urine: 1.02 (ref 1.005–1.030)
pH: 8 (ref 5.0–8.0)

## 2024-02-01 LAB — URINALYSIS, MICROSCOPIC (REFLEX)

## 2024-02-01 MED ORDER — ONDANSETRON 4 MG PO TBDP: 4.0000 mg | ORAL_TABLET | Freq: Three times a day (TID) | ORAL | 0 refills | Status: AC | PRN

## 2024-02-01 MED ORDER — FENTANYL CITRATE PF 50 MCG/ML IJ SOSY
50.0000 ug | PREFILLED_SYRINGE | Freq: Once | INTRAMUSCULAR | Status: AC
  Administered 2024-02-01: 50 ug via INTRAVENOUS
  Filled 2024-02-01: qty 1

## 2024-02-01 MED ORDER — ONDANSETRON HCL 4 MG/2ML IJ SOLN
4.0000 mg | Freq: Once | INTRAMUSCULAR | Status: AC
  Administered 2024-02-01: 4 mg via INTRAVENOUS
  Filled 2024-02-01: qty 2

## 2024-02-01 MED ORDER — OXYCODONE-ACETAMINOPHEN 5-325 MG PO TABS: 1.0000 | ORAL_TABLET | Freq: Four times a day (QID) | ORAL | 0 refills | Status: DC | PRN

## 2024-02-01 MED ORDER — CYCLOBENZAPRINE HCL 10 MG PO TABS: 10.0000 mg | ORAL_TABLET | Freq: Two times a day (BID) | ORAL | 0 refills | Status: AC | PRN

## 2024-02-01 NOTE — ED Provider Notes (Signed)
 [facility] Provider Note   CSN: [id number] Arrival date & time: 02/01/24  1849     Patient presents with: Flank Pain   [name] is a 78 y.o. female.   Patient to ED with sudden onset right flank pain earlier today associated with nausea. No vomiting or fever. No urinary symptoms including no hematuria. History of kidney stones with similar presentation. The pain extends from the flank to the right lateral abdomen.   The history is provided by the patient. No language interpreter was used.  Flank Pain       Prior to Admission medications   Medication Sig Start Date End Date Taking? Authorizing Provider  cyclobenzaprine  (FLEXERIL ) 10 MG tablet Take 1 tablet (10 mg total) by mouth 2 (two) times daily as needed for muscle spasms. 02/01/24  Yes Odell Balls, PA-C  oxyCODONE -acetaminophen  (PERCOCET/ROXICET) 5-325 MG tablet Take 1 tablet by mouth every 6 (six) hours as needed for severe pain (pain score 7-10). 02/01/24  Yes Janace Decker, PA-C  acetaminophen  (TYLENOL ) 500 MG tablet Take 2 tablets (1,000 mg total) by mouth 3 (three) times daily as needed. 03/07/23   Burns, Glade [name], MD  amLODipine  (NORVASC ) 5 MG tablet TAKE 1 TABLET (5 MG TOTAL) BY MOUTH DAILY. 12/19/23 12/13/24  Dunn, Dayna N, PA-C  aspirin  81 MG tablet Take 81 mg by mouth daily.    [provider]  carvedilol  (COREG ) 3.125 MG tablet Take 3.125 mg by mouth 2 (two) times daily. 12/20/23   [provider]  Cholecalciferol (VITAMIN D -3) 25 MCG (1000 UT) CAPS Take 10,000 Units by mouth daily.     [provider]  diclofenac  Sodium (VOLTAREN ) 1 % GEL Apply topically 4 (four) times daily as needed.     [provider]  dicyclomine  (BENTYL ) 20 MG tablet Take 1 tablet (20 mg total) by mouth every 8 (eight) hours as needed for spasms (AB pain). 10/31/23   Craig Alan SAUNDERS, PA-C  EPINEPHrine  (EPIPEN  2-PAK) 0.3 mg/0.3 mL IJ SOAJ injection Use as directed  for severe allergic reactions 03/26/23   Merlynn Niki FALCON, FNP  esomeprazole  (NEXIUM ) 40 MG capsule Take 1 capsule (40 mg total) by mouth daily. Take 30 minutes prior to a meal 01/07/24   Burns, Glade [name], MD  HYDROcodone  bit-homatropine (HYCODAN) 5-1.5 MG/5ML syrup Take 5 mLs by mouth every 8 (eight) hours as needed for cough. 01/14/24   Geofm Glade [name], MD  MAGNESIUM  GLYCINATE PO Take 240 mg by mouth daily.    [provider]  Omega-3 Fatty Acids (FISH OIL BURP-LESS) 1000 MG CAPS Takes 1280 mg 2 caps daiily 01/03/16   Esterwood, Amy S, PA-C  ondansetron  (ZOFRAN -ODT) 4 MG disintegrating tablet Take 1 tablet (4 mg total) by mouth every 8 (eight) hours as needed for nausea or vomiting. 02/01/24   Odell Balls, PA-C  rosuvastatin  (CRESTOR ) 5 MG tablet Take 1 tablet (5 mg total) by mouth daily. 01/21/24   Geofm Glade [name], MD  spironolactone  (ALDACTONE ) 25 MG tablet Take 0.5 tablets (12.5 mg total) by mouth daily. Patient not taking: Reported on 01/28/2024 09/24/23   Geofm Glade [name], MD  Ubiquinol 100 MG CAPS Take 1 tablet by mouth daily.    [provider]  vitamin B-12 (CYANOCOBALAMIN ) 1000 MCG tablet Take 1,000 mcg by mouth 2 (two) times daily.     [provider]  vitamin E 400 UNIT capsule Take 400 Units by mouth daily.    [provider]  zinc  gluconate 50 MG tablet Take 50 mg by mouth daily.    [provider]    Allergies: Bactrim [sulfamethoxazole-trimethoprim], Pentosan polysulfate, Pentosan polysulfate sodium, Promethazine hcl, Sulfamethoxazole-trimethoprim, Lisinopril, Losartan , Pork (diagnostic), and Promethazine hcl    Review of Systems  Genitourinary:  Positive for flank pain.    Updated Vital Signs BP 121/62 (BP Location: Left Arm)   Pulse (!) 55   Temp 98.1 F (36.7 C)   Resp 16   Ht 5' 2 (1.575 m)   Wt 74.2 kg   SpO2 95%   BMI 29.92 kg/m   Physical Exam Vitals and nursing note reviewed.  Constitutional:      General: She is not in  acute distress.    Appearance: She is well-developed. She is not ill-appearing.     Comments: Very well appearing.  Cardiovascular:     Rate and Rhythm: Normal rate.  Pulmonary:     Effort: Pulmonary effort is normal.  Abdominal:     General: There is no distension.     Palpations: Abdomen is soft.     Tenderness: There is no abdominal tenderness. There is right CVA tenderness.  Musculoskeletal:        General: Normal range of motion.     Cervical back: Normal range of motion.  Skin:    General: Skin is warm and dry.  Neurological:     Mental Status: She is alert and oriented to person, place, and time.     (all labs ordered are listed, but only abnormal results are displayed) Labs Reviewed  CBC WITH DIFFERENTIAL/PLATELET - Abnormal; Notable for the following components:      Result Value   Hemoglobin 11.7 (*)    All other components within normal limits  BASIC METABOLIC PANEL WITH GFR - Abnormal; Notable for the following components:   Glucose, Bld 131 (*)    Creatinine, Ser 1.13 (*)    GFR, Estimated 49 (*)    All other components within normal limits  URINALYSIS, ROUTINE W REFLEX MICROSCOPIC - Abnormal; Notable for the following components:   APPearance HAZY (*)    Hgb urine dipstick TRACE (*)    Leukocytes,Ua TRACE (*)    All other components within normal limits  HEPATIC FUNCTION PANEL - Abnormal; Notable for the following components:   Indirect Bilirubin 0.2 (*)    All other components within normal limits  URINALYSIS, MICROSCOPIC (REFLEX) - Abnormal; Notable for the following components:   Bacteria, UA FEW (*)    All other components within normal limits  URINE CULTURE   Results for orders placed or performed during the hospital encounter of 02/01/24  CBC with Differential   Collection Time: 02/01/24  7:27 PM  Result Value Ref Range   WBC 6.8 4.0 - 10.5 K/uL   RBC 4.06 3.87 - 5.11 MIL/uL   Hemoglobin 11.7 (L) 12.0 - 15.0 g/dL   HCT 63.9 63.9 - 53.9 %   MCV  88.7 80.0 - 100.0 fL   MCH 28.8 26.0 - 34.0 pg   MCHC 32.5 30.0 - 36.0 g/dL   RDW 86.7 88.4 - 84.4 %   Platelets 236 150 - 400 K/uL   nRBC 0.0 0.0 - 0.2 %   Neutrophils Relative % 64 %   Neutro Abs 4.3 1.7 - 7.7 K/uL   Lymphocytes Relative 23 %   Lymphs Abs 1.6 0.7 - 4.0 K/uL   Monocytes Relative 8 %   Monocytes Absolute 0.5 0.1 - 1.0 K/uL  Eosinophils Relative 4 %   Eosinophils Absolute 0.3 0.0 - 0.5 K/uL   Basophils Relative 1 %   Basophils Absolute 0.1 0.0 - 0.1 K/uL   Immature Granulocytes 0 %   Abs Immature Granulocytes 0.01 0.00 - 0.07 K/uL  Basic metabolic panel   Collection Time: 02/01/24  7:27 PM  Result Value Ref Range   Sodium 143 135 - 145 mmol/L   Potassium 4.1 3.5 - 5.1 mmol/L   Chloride 106 98 - 111 mmol/L   CO2 24 22 - 32 mmol/L   Glucose, Bld 131 (H) 70 - 99 mg/dL   BUN 17 8 - 23 mg/dL   Creatinine, Ser 8.86 (H) 0.44 - 1.00 mg/dL   Calcium  9.8 8.9 - 10.3 mg/dL   GFR, Estimated 49 (L) >60 mL/min   Anion gap 13 5 - 15  Hepatic function panel   Collection Time: 02/01/24  7:27 PM  Result Value Ref Range   Total Protein 7.5 6.5 - 8.1 g/dL   Albumin 4.5 3.5 - 5.0 g/dL   AST 37 15 - 41 U/L   ALT 42 0 - 44 U/L   Alkaline Phosphatase 113 38 - 126 U/L   Total Bilirubin 0.3 0.0 - 1.2 mg/dL   Bilirubin, Direct 0.1 0.0 - 0.2 mg/dL   Indirect Bilirubin 0.2 (L) 0.3 - 0.9 mg/dL  Urinalysis, Routine w reflex microscopic -Urine, Clean Catch   Collection Time: 02/01/24  7:36 PM  Result Value Ref Range   Color, Urine YELLOW YELLOW   APPearance HAZY (A) CLEAR   Specific Gravity, Urine 1.020 1.005 - 1.030   pH 8.0 5.0 - 8.0   Glucose, UA NEGATIVE NEGATIVE mg/dL   Hgb urine dipstick TRACE (A) NEGATIVE   Bilirubin Urine NEGATIVE NEGATIVE   Ketones, ur NEGATIVE NEGATIVE mg/dL   Protein, ur NEGATIVE NEGATIVE mg/dL   Nitrite NEGATIVE NEGATIVE   Leukocytes,Ua TRACE (A) NEGATIVE  Urinalysis, Microscopic (reflex)   Collection Time: 02/01/24  7:36 PM  Result Value Ref  Range   RBC / HPF 0-5 0 - 5 RBC/hpf   WBC, UA 6-10 0 - 5 WBC/hpf   Bacteria, UA FEW (A) NONE SEEN   Squamous Epithelial / HPF 0-5 0 - 5 /HPF   *Note: Due to a large number of results and/or encounters for the requested time period, some results have not been displayed. A complete set of results can be found in Results Review.     EKG: None  Radiology: CT Renal Stone Study Result Date: 02/01/2024 CLINICAL DATA:  Abdominal/flank pain, stone suspected EXAM: CT ABDOMEN AND PELVIS WITHOUT CONTRAST TECHNIQUE: Multidetector CT imaging of the abdomen and pelvis was performed following the standard protocol without IV contrast. RADIATION DOSE REDUCTION: This exam was performed according to the departmental dose-optimization program which includes automated exposure control, adjustment of the mA and/or kV according to patient size and/or use of iterative reconstruction technique. COMPARISON:  CT abdomen pelvis 11/01/2023, CT abdomen pelvis 08/14/2013 FINDINGS: Lower chest: Coronary artery calcifications. Hepatobiliary: No focal liver abnormality. No gallstones, gallbladder wall thickening, or pericholecystic fluid. No biliary dilatation. Pancreas: No focal lesion. Normal pancreatic contour. No surrounding inflammatory changes. No main pancreatic ductal dilatation. Spleen: Normal in size without focal abnormality. Adrenals/Urinary Tract: No adrenal nodule bilaterally. Similar slight fullness of the right renal pelvis. No nephrolithiasis and no hydronephrosis. No definite contour-deforming renal mass. No ureterolithiasis or hydroureter. The urinary bladder is unremarkable. Stomach/Bowel: Stomach is within normal limits. No evidence of bowel wall thickening or  dilatation. Colonic diverticulosis. Appendix appears normal. Vascular/Lymphatic: No abdominal aorta or iliac aneurysm. Mild atherosclerotic plaque of the aorta and its branches. No abdominal, pelvic, or inguinal lymphadenopathy. Reproductive: Status post  hysterectomy. No adnexal masses. Other: No intraperitoneal free fluid. No intraperitoneal free gas. No organized fluid collection. Musculoskeletal: No abdominal wall hernia or abnormality. No suspicious lytic or blastic osseous lesions. No acute displaced fracture. Multilevel degenerative changes of the spine. IMPRESSION: 1. Similar slight fullness of the right renal pelvis. Limited evaluation on this noncontrast study. 2. Colonic diverticulosis with no acute diverticulitis. 3.  Aortic Atherosclerosis (ICD10-I70.0). Electronically Signed   By: Morgane  Naveau M.D.   On: 02/01/2024 20:30     Procedures   Medications Ordered in the ED  fentaNYL  (SUBLIMAZE ) injection 50 mcg (has no administration in time range)  fentaNYL  (SUBLIMAZE ) injection 50 mcg (50 mcg Intravenous Given 02/01/24 1939)  ondansetron  (ZOFRAN ) injection 4 mg (4 mg Intravenous Given 02/01/24 1937)  fentaNYL  (SUBLIMAZE ) injection 50 mcg (50 mcg Intravenous Given 02/01/24 2028)                                    Medical Decision Making This patient presents to the ED for concern of flank pain, this involves an extensive number of treatment options, and is a complaint that carries with it a high risk of complications and morbidity.  The differential diagnosis includes pyelonephritis, nephro-/ureterolithiasis, colitis, bowel obstruction, appendicitis, ovarian pathology   Co morbidities that complicate the patient evaluation  Kidney stones, HTN, HLD, GERD, interstitial cystitis, arthritis, CAD   Additional history obtained:  Additional history and/or information obtained from chart review, notable for    Lab Tests:  I Ordered, and personally interpreted labs.  The pertinent results include:   CBC: no leukocytosis, hgb 11.7, normal plts Cmet: Glucose 131, Cr 1.13, otherwise normal values UA - trace leukocytes, WBC 6-10, few bacteria   Imaging Studies ordered:  I ordered imaging studies including CT renal study Per  radiologist interpretation:  IMPRESSION: 1. Similar slight fullness of the right renal pelvis. Limited evaluation on this noncontrast study. 2. Colonic diverticulosis with no acute diverticulitis. 3.  Aortic Atherosclerosis (ICD10-I70.0).    Cardiac Monitoring:  The patient was maintained on a cardiac monitor.  I personally viewed and interpreted the cardiac monitored which showed an underlying rhythm of: n/a   Medicines ordered and prescription drug management:  I ordered medication including fentanyl   for pain Reevaluation of the patient after these medicines showed that the patient improved I have reviewed the patients home medicines and have made adjustments as needed   Test Considered:  N/a   Critical Interventions:  N/a   Consultations Obtained:  I requested consultation with the n/a,  and discussed lab and imaging findings as well as pertinent plan - they recommend: n/a   Problem List / ED Course:  Here with right flank pain that feels like kidney stone No fever, no urinary symptoms Very well appearing  Labs reassuring, CT renal study negative for kidney stones Urine cultured Discussed/reviewed with dr. Elnor: will provide pain relief for potential MSK pain, Zofran . Encourage f/u with PCP in 2-3 days for recheck and review of urine culture.    Reevaluation:  After the interventions noted above, I reevaluated the patient and found that they have :improved   Social Determinants of Health:  Never a smoker   Disposition:  After consideration of the diagnostic results  and the patients response to treatment, I feel that the patient would benefit from discharge home.   Amount and/or Complexity of Data Reviewed Labs: ordered. Radiology: ordered.  Risk Prescription drug management.        Final diagnoses:  Flank pain    ED Discharge Orders          Ordered    ondansetron  (ZOFRAN -ODT) 4 MG disintegrating tablet  Every 8 hours PRN         02/01/24 2133    oxyCODONE -acetaminophen  (PERCOCET/ROXICET) 5-325 MG tablet  Every 6 hours PRN        02/01/24 2133    cyclobenzaprine  (FLEXERIL ) 10 MG tablet  2 times daily PRN        02/01/24 2133               Odell Balls, PA-C 02/01/24 2134    Elnor Jayson LABOR, DO 02/04/24 1516

## 2024-02-01 NOTE — ED Triage Notes (Signed)
 Pt c/o RT flank pain that started 1 hr ago; hx of kidney stone, feels similar

## 2024-02-01 NOTE — Discharge Instructions (Addendum)
 As we discussed, please follow up with your doctor in 3 days for recheck. Take the medications as prescribed. You can take Tylenol  in addition to the Percocet for do not go over 4000 mg of Tylenol  in a 24 hour period.   If you develop a fever, have severe or uncontrolled pain, please return to the ED for further evaluation.

## 2024-02-04 LAB — URINE CULTURE: Culture: 20000 — AB

## 2024-02-05 ENCOUNTER — Telehealth (HOSPITAL_BASED_OUTPATIENT_CLINIC_OR_DEPARTMENT_OTHER): Payer: Self-pay | Admitting: *Deleted

## 2024-02-05 NOTE — Telephone Encounter (Signed)
 Post ED Visit - Positive Culture Follow-up  Culture report reviewed by antimicrobial stewardship pharmacist: [facility] [x]  [place], [place].D. []  Venetia Gully, Pharm.D., BCPS AQ-ID []  Garrel Crews, Pharm.D., BCPS []  Almarie Lunger, [address].D., BCPS []  [place], [address].D., BCPS, AAHIVP []  Rosaline Bihari, Pharm.D., BCPS, AAHIVP []  Vernell Meier, PharmD, BCPS []  Latanya Hint, PharmD, BCPS []  Donald Medley, PharmD, BCPS []  Rocky Bold, PharmD []  Dorothyann Alert, PharmD, BCPS []  Morene Babe, PharmD  [facility] []  Rosaline Edison, PharmD []  Romona Bliss, PharmD []  Dolphus Roller, PharmD []  Veva Seip, Rph []  Vernell Daunt) [name], PharmD []  Eva Allis, PharmD []  Rosaline Millet, PharmD []  Iantha Batch, PharmD []  Arvin Gauss, PharmD []  Wanda Hasting, PharmD []  Ronal Rav, PharmD []  Rocky Slade, PharmD []  Bard Jeans, PharmD   Positive urine culture No further patient follow-up is required at this time.  Jama Wyman Kipper 02/05/2024, 9:34 AM

## 2024-02-19 NOTE — Progress Notes (Unsigned)
 Subjective:    Patient ID: [name], female    DOB: Jun 09, 1946, 78 y.o.   MRN: [id number]     HPI [name] is here for follow up of her chronic medical problems.  ED 7/4 for R flank pain - had sudden onset of flank pain earlier that day. Pain was severe. She could not get comfortable. Positive nausea.  No f/c, V or urine symptoms.  Has hx of renal stones.  Pain extended from flank to right lateral abdomen toward the end.    No leukocytosis, hgb 11.7, cr 1/13, ua with trace leukocytes, WBC 6-10 few bacteria  Ct renal - fullness in right renal pelvis - similar to prior.  Diverticulosis,  aortic atherosclerosis.   Prescribed flexeril  and vicodin for possible msk cause.   Has not had that particular pain again.  No back pain, flank pain or new lower abdomen.    Her joints are hurting more recently- fingers, ankles  Left lateral leg from hip down towards ankle has been hurting- difficulty going to sleep.  She can not get comfortable.  It hurts most of the time-no one physician is more comfortable than another.  She does have some chronic lower back pain, but it is not worse than usual.     Medications and allergies reviewed with patient and updated if appropriate.  Current Outpatient Medications on File Prior to Visit  Medication Sig Dispense Refill   acetaminophen  (TYLENOL ) 500 MG tablet Take 2 tablets (1,000 mg total) by mouth 3 (three) times daily as needed.     amLODipine  (NORVASC ) 5 MG tablet TAKE 1 TABLET (5 MG TOTAL) BY MOUTH DAILY. 90 tablet 3   aspirin  81 MG tablet Take 81 mg by mouth daily.     carvedilol  (COREG ) 3.125 MG tablet Take 3.125 mg by mouth 2 (two) times daily.     Cholecalciferol (VITAMIN D -3) 25 MCG (1000 UT) CAPS Take 10,000 Units by mouth daily.      cyclobenzaprine  (FLEXERIL ) 10 MG tablet Take 1 tablet (10 mg total) by mouth 2 (two) times daily as needed for muscle spasms. 14 tablet 0   diclofenac  Sodium (VOLTAREN ) 1 % GEL Apply topically 4 (four)  times daily as needed.      dicyclomine  (BENTYL ) 20 MG tablet Take 1 tablet (20 mg total) by mouth every 8 (eight) hours as needed for spasms (AB pain). 30 tablet 0   EPINEPHrine  (EPIPEN  2-PAK) 0.3 mg/0.3 mL IJ SOAJ injection Use as directed for severe allergic reactions 2 each 1   esomeprazole  (NEXIUM ) 40 MG capsule Take 1 capsule (40 mg total) by mouth daily. Take 30 minutes prior to a meal 90 capsule 1   HYDROcodone  bit-homatropine (HYCODAN) 5-1.5 MG/5ML syrup Take 5 mLs by mouth every 8 (eight) hours as needed for cough. 120 mL 0   MAGNESIUM  GLYCINATE PO Take 240 mg by mouth daily.     Omega-3 Fatty Acids (FISH OIL BURP-LESS) 1000 MG CAPS Takes 1280 mg 2 caps daiily 60 capsule 0   ondansetron  (ZOFRAN -ODT) 4 MG disintegrating tablet Take 1 tablet (4 mg total) by mouth every 8 (eight) hours as needed for nausea or vomiting. 20 tablet 0   oxyCODONE -acetaminophen  (PERCOCET/ROXICET) 5-325 MG tablet Take 1 tablet by mouth every 6 (six) hours as needed for severe pain (pain score 7-10). 10 tablet 0   rosuvastatin  (CRESTOR ) 5 MG tablet Take 1 tablet (5 mg total) by mouth daily. 90 tablet 3   Ubiquinol 100 MG  CAPS Take 1 tablet by mouth daily.     vitamin B-12 (CYANOCOBALAMIN ) 1000 MCG tablet Take 1,000 mcg by mouth 2 (two) times daily.      vitamin E 400 UNIT capsule Take 400 Units by mouth daily.     zinc  gluconate 50 MG tablet Take 50 mg by mouth daily.     spironolactone  (ALDACTONE ) 25 MG tablet Take 0.5 tablets (12.5 mg total) by mouth daily. (Patient not taking: Reported on 02/20/2024) 45 tablet 1   No current facility-administered medications on file prior to visit.     Review of Systems  Constitutional:  Negative for fever.  Genitourinary:  Negative for dysuria, flank pain and hematuria.  Musculoskeletal:  Positive for arthralgias and back pain.       Left lateral leg pain       Objective:   Vitals:   02/20/24 1449  BP: 116/74  Pulse: 72  Temp: 98.2 F (36.8 C)  SpO2: 97%   BP  Readings from Last 3 Encounters:  02/20/24 116/74  02/01/24 114/64  01/28/24 128/82   Wt Readings from Last 3 Encounters:  02/20/24 162 lb (73.5 kg)  02/01/24 163 lb 9.6 oz (74.2 kg)  01/28/24 163 lb 9.6 oz (74.2 kg)   Body mass index is 29.63 kg/m.    Physical Exam     Lab Results  Component Value Date   WBC 6.8 02/01/2024   HGB 11.7 (L) 02/01/2024   HCT 36.0 02/01/2024   PLT 236 02/01/2024   GLUCOSE 131 (H) 02/01/2024   CHOL 116 04/27/2023   TRIG 94.0 04/27/2023   HDL 59.00 04/27/2023   LDLDIRECT 114.0 05/02/2016   LDLCALC 38 04/27/2023   ALT 42 02/01/2024   AST 37 02/01/2024   NA 143 02/01/2024   K 4.1 02/01/2024   CL 106 02/01/2024   CREATININE 1.13 (H) 02/01/2024   BUN 17 02/01/2024   CO2 24 02/01/2024   TSH 2.85 01/07/2024   INR 0.9 01/18/2022   HGBA1C 6.6 (H) 01/07/2024   MICROALBUR 0.8 07/23/2006     Assessment & Plan:    See Problem List for Assessment and Plan of chronic medical problems.

## 2024-02-20 ENCOUNTER — Ambulatory Visit: Admitting: Internal Medicine

## 2024-02-20 ENCOUNTER — Encounter: Payer: Self-pay | Admitting: Internal Medicine

## 2024-02-20 VITALS — BP 116/74 | HR 72 | Temp 98.2°F | Ht 62.0 in | Wt 162.0 lb

## 2024-02-20 DIAGNOSIS — M150 Primary generalized (osteo)arthritis: Secondary | ICD-10-CM | POA: Diagnosis not present

## 2024-02-20 DIAGNOSIS — M79605 Pain in left leg: Secondary | ICD-10-CM | POA: Diagnosis not present

## 2024-02-20 DIAGNOSIS — N1831 Chronic kidney disease, stage 3a: Secondary | ICD-10-CM | POA: Diagnosis not present

## 2024-02-20 DIAGNOSIS — M1990 Unspecified osteoarthritis, unspecified site: Secondary | ICD-10-CM | POA: Insufficient documentation

## 2024-02-20 DIAGNOSIS — I10 Essential (primary) hypertension: Secondary | ICD-10-CM

## 2024-02-20 NOTE — Assessment & Plan Note (Signed)
 Chronic Primary osteoarthritis Multiple joints-specifically hands and ankles-increased pain recently Autoimmune blood work in the past has been negative She does have osteoarthritis Discussed the only treatment is symptomatic treatment

## 2024-02-20 NOTE — Patient Instructions (Addendum)
    See orthopedics for your leg pain    Medications changes include :   None    A kidney ultrasound was ordered and someone will call you to schedule an appointment.

## 2024-02-20 NOTE — Assessment & Plan Note (Signed)
 Chronic Blood pressure well-controlled-on the low side at times. Continue amlodipine  5 mg daily, Coreg  3.125 mg twice daily and valsartan  40 mg daily Continue to monitor BP at home

## 2024-02-20 NOTE — Assessment & Plan Note (Signed)
 Chronic Very mild Recent CT scan of kidney showed fullness in the right renal pelvis which was similar to previous results-I do not think this is concerning, but will go ahead and get an ultrasound of the kidneys

## 2024-02-20 NOTE — Assessment & Plan Note (Signed)
 Acute Left lateral leg pain from hip down towards ankle ?  Lumbar radiculopathy versus related to an hip issue Has chronic lower back pain-not worse than usual Most likely this is lumbar radiculopathy Advised seeing orthopedics for further evaluation

## 2024-02-26 ENCOUNTER — Ambulatory Visit
Admission: RE | Admit: 2024-02-26 | Discharge: 2024-02-26 | Disposition: A | Discharge status: Home / Self Care | Source: Ambulatory Visit | Attending: Internal Medicine

## 2024-02-26 DIAGNOSIS — N1831 Chronic kidney disease, stage 3a: Secondary | ICD-10-CM

## 2024-02-26 DIAGNOSIS — N2889 Other specified disorders of kidney and ureter: Secondary | ICD-10-CM | POA: Diagnosis not present

## 2024-03-03 ENCOUNTER — Ambulatory Visit: Payer: Self-pay | Admitting: Internal Medicine

## 2024-03-16 ENCOUNTER — Other Ambulatory Visit: Payer: Self-pay | Admitting: Physician Assistant

## 2024-03-18 DIAGNOSIS — H10413 Chronic giant papillary conjunctivitis, bilateral: Secondary | ICD-10-CM | POA: Diagnosis not present

## 2024-03-18 DIAGNOSIS — H524 Presbyopia: Secondary | ICD-10-CM | POA: Diagnosis not present

## 2024-03-18 DIAGNOSIS — H18832 Recurrent erosion of cornea, left eye: Secondary | ICD-10-CM | POA: Diagnosis not present

## 2024-03-18 DIAGNOSIS — Z961 Presence of intraocular lens: Secondary | ICD-10-CM | POA: Diagnosis not present

## 2024-03-24 ENCOUNTER — Encounter: Payer: Self-pay | Admitting: Internal Medicine

## 2024-03-24 ENCOUNTER — Ambulatory Visit (INDEPENDENT_AMBULATORY_CARE_PROVIDER_SITE_OTHER): Admitting: Internal Medicine

## 2024-03-24 VITALS — BP 126/82 | HR 71 | Temp 98.1°F | Ht 62.0 in | Wt 160.0 lb

## 2024-03-24 DIAGNOSIS — R42 Dizziness and giddiness: Secondary | ICD-10-CM

## 2024-03-24 DIAGNOSIS — I10 Essential (primary) hypertension: Secondary | ICD-10-CM

## 2024-03-24 MED ORDER — DIAZEPAM 5 MG PO TABS: ORAL_TABLET | ORAL | 0 refills | Status: DC

## 2024-03-24 MED ORDER — MECLIZINE HCL 12.5 MG PO TABS: 12.5000 mg | ORAL_TABLET | Freq: Three times a day (TID) | ORAL | 0 refills | Status: AC | PRN

## 2024-03-24 NOTE — Assessment & Plan Note (Signed)
 Chronic Blood pressure well-controlled Continue amlodipine  5 mg daily, Coreg  3.125 mg twice daily

## 2024-03-24 NOTE — Progress Notes (Signed)
 Subjective:    Patient ID: [name], female    DOB: 1945-10-17, 78 y.o.   MRN: [id number]      HPI [name] is here for  Chief Complaint  Patient presents with   Dizziness  Here with her daughter.    Discussed the use of AI scribe software for clinical note transcription with the patient, who gave verbal consent to proceed.  History of Present Illness [name] is an 78 year old female who presents with dizziness and lightheadedness.  [name] experiences episodes of dizziness and lightheadedness, particularly when changing positions such as standing up quickly or bending over. The dizziness is described as a sensation of spinning, and the lightheadedness [name] sometimes gets is a feeling of imbalance, sometimes causing her to veer off while walking. These symptoms have been present intermittently but became severe on Saturday, leading her to stay in bed all day Sunday. The dizziness occurs when [name] stands and often related to changes in position/head movement, but not always.   It almost feels constant.  The dizziness is sometimes accompanied by a 'fluttery' sensation in her left ear.  [name] experiences episodes of hot flashes multiple times a day, characterized by sweating behind her ears, down her scalp, under her bra, and between her legs. These episodes last about four to five minutes. [name] has a history of early menopause and has undergone a hysterectomy with removal of both ovaries. These hot flashes are not new.    [name] occasionally experiences sharp, brief pains in her head, which [name] describes as nerve-like pain rather than a headache. These pains last about a minute and a half and occur infrequently.  [name] has a history of sinus congestion and takes Zyrtec at night to manage her symptoms. [name] did not take it the previous night and anticipates increased nasal symptoms when exposed to outdoor allergens. [name] also notes puffiness under her eyes, which [name] finds concerning.  [name]  mentions experiencing tingling in her legs, particularly from the knees down, and sometimes applies Voltaren  for relief. [name] wears compression socks, which [name] feels may help.  [name] occasionally feels nauseated but does not vomit. This is not new and occurs randomly.       Medications and allergies reviewed with patient and updated if appropriate.  Current Outpatient Medications on File Prior to Visit  Medication Sig Dispense Refill   acetaminophen  (TYLENOL ) 500 MG tablet Take 2 tablets (1,000 mg total) by mouth 3 (three) times daily as needed.     amLODipine  (NORVASC ) 5 MG tablet TAKE 1 TABLET (5 MG TOTAL) BY MOUTH DAILY. 90 tablet 3   aspirin  81 MG tablet Take 81 mg by mouth daily.     carvedilol  (COREG ) 3.125 MG tablet TAKE 1 TABLET BY MOUTH TWICE A DAY 180 tablet 1   Cholecalciferol (VITAMIN D -3) 25 MCG (1000 UT) CAPS Take 10,000 Units by mouth daily.      cyclobenzaprine  (FLEXERIL ) 10 MG tablet Take 1 tablet (10 mg total) by mouth 2 (two) times daily as needed for muscle spasms. 14 tablet 0   diclofenac  Sodium (VOLTAREN ) 1 % GEL Apply topically 4 (four) times daily as needed.      dicyclomine  (BENTYL ) 20 MG tablet Take 1 tablet (20 mg total) by mouth every 8 (eight) hours as needed for spasms (AB pain). 30 tablet 0   EPINEPHrine  (EPIPEN  2-PAK) 0.3 mg/0.3 mL IJ SOAJ injection Use as directed for severe allergic reactions 2 each 1   esomeprazole  (  NEXIUM ) 40 MG capsule Take 1 capsule (40 mg total) by mouth daily. Take 30 minutes prior to a meal 90 capsule 1   HYDROcodone  bit-homatropine (HYCODAN) 5-1.5 MG/5ML syrup Take 5 mLs by mouth every 8 (eight) hours as needed for cough. 120 mL 0   MAGNESIUM  GLYCINATE PO Take 240 mg by mouth daily.     Omega-3 Fatty Acids (FISH OIL BURP-LESS) 1000 MG CAPS Takes 1280 mg 2 caps daiily 60 capsule 0   ondansetron  (ZOFRAN -ODT) 4 MG disintegrating tablet Take 1 tablet (4 mg total) by mouth every 8 (eight) hours as needed for nausea or vomiting. 20 tablet 0    oxyCODONE -acetaminophen  (PERCOCET/ROXICET) 5-325 MG tablet Take 1 tablet by mouth every 6 (six) hours as needed for severe pain (pain score 7-10). 10 tablet 0   rosuvastatin  (CRESTOR ) 5 MG tablet Take 1 tablet (5 mg total) by mouth daily. 90 tablet 3   Ubiquinol 100 MG CAPS Take 1 tablet by mouth daily.     vitamin B-12 (CYANOCOBALAMIN ) 1000 MCG tablet Take 1,000 mcg by mouth 2 (two) times daily.      vitamin E 400 UNIT capsule Take 400 Units by mouth daily.     zinc  gluconate 50 MG tablet Take 50 mg by mouth daily.     spironolactone  (ALDACTONE ) 25 MG tablet Take 0.5 tablets (12.5 mg total) by mouth daily. (Patient not taking: Reported on 03/24/2024) 45 tablet 1   No current facility-administered medications on file prior to visit.    Review of Systems  Constitutional:  Negative for fever.  HENT:  Positive for postnasal drip and sinus pressure (chronic sinus pressure). Negative for congestion, ear pain and sore throat.        Fluttering sensation in left ear  Cardiovascular:  Negative for chest pain and palpitations.  Gastrointestinal:  Positive for nausea (occ).  Neurological:  Positive for dizziness. Negative for weakness, numbness (tingling legs intermittently) and headaches (occ).       Objective:   Vitals:   03/24/24 1520  BP: 126/82  Pulse: 71  Temp: 98.1 F (36.7 C)  SpO2: 97%   BP Readings from Last 3 Encounters:  03/24/24 126/82  02/20/24 116/74  02/01/24 114/64   Wt Readings from Last 3 Encounters:  03/24/24 160 lb (72.6 kg)  02/20/24 162 lb (73.5 kg)  02/01/24 163 lb 9.6 oz (74.2 kg)   Body mass index is 29.26 kg/m.    Physical Exam Constitutional:      General: [name] is not in acute distress.    Appearance: Normal appearance. [name] is not ill-appearing.  HENT:     Head: Normocephalic and atraumatic.     Right Ear: Tympanic membrane, ear canal and external ear normal.     Left Ear: Tympanic membrane, ear canal and external ear normal.     Mouth/Throat:      Mouth: Mucous membranes are moist.     Pharynx: No oropharyngeal exudate or posterior oropharyngeal erythema.  Eyes:     Conjunctiva/sclera: Conjunctivae normal.  Cardiovascular:     Rate and Rhythm: Normal rate and regular rhythm.  Pulmonary:     Effort: Pulmonary effort is normal. No respiratory distress.     Breath sounds: Normal breath sounds. No wheezing or rales.  Musculoskeletal:     Cervical back: Neck supple. No tenderness.  Lymphadenopathy:     Cervical: No cervical adenopathy.  Skin:    General: Skin is warm and dry.  Neurological:     General: No focal  deficit present.     Mental Status: [name] is alert and oriented to person, place, and time. Mental status is at baseline.     Cranial Nerves: No cranial nerve deficit.     Sensory: No sensory deficit.     Motor: No weakness.     Comments: No obvious nystagmus            Assessment & Plan:    See Problem List for Assessment and Plan of chronic medical problems.

## 2024-03-24 NOTE — Patient Instructions (Addendum)
        Medications changes include :   meclizine  12.5 mg tid as needed    A referral was ordered physical therapy and someone will call you to schedule an appointment.   An MRI was ordered    Return if symptoms worsen or fail to improve.

## 2024-03-24 NOTE — Assessment & Plan Note (Signed)
 Acute Has had some lightheadedness/dizziness in the past This sounds like true dizziness Often it is related to head movements and changes in position which makes BPPV very possible Start meclizine  12.5 mg 3 times daily as needed-discussed risk of drowsiness, advised taking Zyrtec nightly, referral to vestibular PT She has some components of simple vertigo and given age and risk factors we will get an MRI of her brain-no neurological deficits on exam or by history.

## 2024-03-29 ENCOUNTER — Ambulatory Visit
Admission: RE | Admit: 2024-03-29 | Discharge: 2024-03-29 | Disposition: A | Discharge status: Home / Self Care | Source: Ambulatory Visit | Attending: Internal Medicine | Admitting: Internal Medicine

## 2024-03-29 DIAGNOSIS — R42 Dizziness and giddiness: Secondary | ICD-10-CM

## 2024-04-02 ENCOUNTER — Ambulatory Visit: Payer: Self-pay | Admitting: Internal Medicine

## 2024-04-10 ENCOUNTER — Telehealth: Payer: Self-pay | Admitting: Physician Assistant

## 2024-04-10 NOTE — Telephone Encounter (Signed)
 Inbound call from pt stating that for the past three day (04/09/10) of September she has noticed that she has had loose BM like diarrhea. Patient is requesting to have a nurse call her back to advise her what to do. Please advise.

## 2024-04-10 NOTE — Telephone Encounter (Signed)
 Returned patient call & she's been having loose stools w/fecal incontinence (2-3/day) for the last 3 days. Prior to this she was having constipation and starting taking miralax once daily. Denies any pain, n/v. Her husband is currently undergoing leukemia work up & she needs to be able to drive him to his appointments without concern for the stools/incontinence. Last seen with Amanda 12/13/23 for OV.

## 2024-04-11 ENCOUNTER — Other Ambulatory Visit: Payer: Self-pay

## 2024-04-11 DIAGNOSIS — R197 Diarrhea, unspecified: Secondary | ICD-10-CM

## 2024-04-11 NOTE — Telephone Encounter (Signed)
 Left message for patient to call back

## 2024-04-17 ENCOUNTER — Ambulatory Visit: Payer: Self-pay | Admitting: Physician Assistant

## 2024-04-17 ENCOUNTER — Other Ambulatory Visit: Payer: Self-pay

## 2024-04-17 ENCOUNTER — Other Ambulatory Visit (INDEPENDENT_AMBULATORY_CARE_PROVIDER_SITE_OTHER)

## 2024-04-17 ENCOUNTER — Ambulatory Visit (INDEPENDENT_AMBULATORY_CARE_PROVIDER_SITE_OTHER)
Admission: RE | Admit: 2024-04-17 | Discharge: 2024-04-17 | Disposition: A | Discharge status: Home / Self Care | Source: Ambulatory Visit | Attending: Physician Assistant | Admitting: Physician Assistant

## 2024-04-17 DIAGNOSIS — R197 Diarrhea, unspecified: Secondary | ICD-10-CM

## 2024-04-17 LAB — COMPREHENSIVE METABOLIC PANEL WITH GFR
ALT: 31 U/L (ref 0–35)
AST: 32 U/L (ref 0–37)
Albumin: 4.3 g/dL (ref 3.5–5.2)
Alkaline Phosphatase: 88 U/L (ref 39–117)
BUN: 14 mg/dL (ref 6–23)
CO2: 27 meq/L (ref 19–32)
Calcium: 9.7 mg/dL (ref 8.4–10.5)
Chloride: 108 meq/L (ref 96–112)
Creatinine, Ser: 0.96 mg/dL (ref 0.40–1.20)
GFR: 56 mL/min — ABNORMAL LOW (ref >60.00)
Glucose, Bld: 91 mg/dL (ref 70–99)
Potassium: 4 meq/L (ref 3.5–5.1)
Sodium: 141 meq/L (ref 135–145)
Total Bilirubin: 0.3 mg/dL (ref 0.2–1.2)
Total Protein: 7.3 g/dL (ref 6.0–8.3)

## 2024-04-17 LAB — CBC
HCT: 35.1 % — ABNORMAL LOW (ref 36.0–46.0)
Hemoglobin: 11.7 g/dL — ABNORMAL LOW (ref 12.0–15.0)
MCHC: 33.3 g/dL (ref 30.0–36.0)
MCV: 86.8 fl (ref 78.0–100.0)
Platelets: 279 K/uL (ref 150.0–400.0)
RBC: 4.05 Mil/uL (ref 3.87–5.11)
RDW: 13.4 % (ref 11.5–15.5)
WBC: 6.2 K/uL (ref 4.0–10.5)

## 2024-04-17 MED ORDER — DICYCLOMINE HCL 20 MG PO TABS: 10.0000 mg | ORAL_TABLET | Freq: Three times a day (TID) | ORAL | 0 refills | Status: AC | PRN

## 2024-04-17 NOTE — Telephone Encounter (Signed)
 Discussed PA recommendations with patient. Advised her on when/where to go for labs/KUB/stool kit. She will come in at earliest convenience. Prescription sent to pharmacy. Pt verbalized all understanding.

## 2024-04-17 NOTE — Addendum Note (Signed)
 Addended by: Parys Elenbaas H on: 04/17/2024 09:35 AM   Modules accepted: Orders

## 2024-04-18 ENCOUNTER — Encounter: Payer: Self-pay | Admitting: Internal Medicine

## 2024-04-18 DIAGNOSIS — D649 Anemia, unspecified: Secondary | ICD-10-CM

## 2024-04-23 NOTE — Therapy (Addendum)
 OUTPATIENT PHYSICAL THERAPY VESTIBULAR EVALUATION / DC SUMMARY     Patient Name: [name] MRN: [id number] DOB:Dec 27, 1945, 78 y.o., female Today's Date: 04/24/2024  END OF SESSION:  PT End of Session - 04/24/24 1452     Visit Number 1    Date for Recertification  06/19/24    PT Start Time 1447    PT Stop Time 1530    PT Time Calculation (min) 43 min    Activity Tolerance Patient tolerated treatment well;No increased pain    Behavior During Therapy WFL for tasks assessed/performed          Past Medical History:  Diagnosis Date   Allergy     seasonal   Ankylosing spondylitis (HCC)    Anxiety    Arthritis    Bell palsy    yrs ago   Blood transfusion without reported diagnosis    CAD (coronary artery disease)    a. Coronary CT 10/2016 showed calcium  score in the 59th perecntile, moderate CAD in RCA/PDA and narrowing of the mid to distal LAD, with negative FFR.   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    GERD (gastroesophageal reflux disease)    history of CTS (carpal tunnel syndrome)    History of kidney stones    HLA B27 (HLA B27 positive)    Hx of colonic polyps    Hyperlipidemia    NMR 2005   Hypertension    Hypothyroidism    Incontinence    Internal hemorrhoids    Interstitial cystitis    Kidney stone    Menopause    Mild aortic insufficiency    Mild ascending aorta dilatation    [facility]ed [facility]  [facility]   Osteoarthritis    shoulder, ankles, hands   Osteopenia    [facility] [address]   Osteopenia    Pneumonia    2 x as child   Pre-diabetes    PSVT (paroxysmal supraventricular tachycardia)    S/P total hysterectomy and bilateral salpingo-oophorectomy 04/12/2014   Sinusitis    Sleep apnea    c-pap   Subjective visual disturbance of both eyes 12/30/2013    Tubular adenoma of colon    Vitamin D  deficiency    Xerostomia    and Xeroophthalmia(oral dryness)   Past Surgical History:  Procedure Laterality Date   BLADDER SURGERY     for incontinence   CARPAL TUNNEL RELEASE Left 12/22/2014   Procedure: LEFT CARPAL TUNNEL RELEASE;  Surgeon: Arley Curia, MD;  Location: [facility];  Service: Orthopedics;  Laterality: Left;   CARPAL TUNNEL RELEASE Right 02/18/2019   Procedure: RIGHT CARPAL TUNNEL RELEASE;  Surgeon: Curia Arley, MD;  Location: [facility];  Service: Orthopedics;  Laterality: Right;   cataract excision Bilateral 2022   COLONOSCOPY  2019   COLONOSCOPY W/ POLYPECTOMY     adenomatous poyp 04-2008   COLONOSCOPY WITH ESOPHAGOGASTRODUODENOSCOPY (EGD)  10/08/2023   colonscopy     tics 10-2002   CYSTOSCOPY WITH RETROGRADE PYELOGRAM, URETEROSCOPY AND STENT PLACEMENT Left 09/01/2013   Procedure: CYSTOSCOPY WITH RETROGRADE PYELOGRAM, AND LEFT STENT PLACEMENT;  Surgeon: Toribio Neysa Repine, MD;  Location: [facility];  Service: Urology;  Laterality: Left;   EXCISION OF KELOID N/A 03/09/2023   Procedure: TILLMAN  BIOPSY SEBACEOUS CYST LEFT BUTTOCK;  Surgeon: Debby Hila, MD;  Location: [facility];  Service: General;  Laterality: N/A;   FINGER SURGERY Left 2004   2nd finger   G 1 P 1     interstitial cystitis     with clot in catheter   SHOULDER ARTHROSCOPY Left    TOTAL ABDOMINAL HYSTERECTOMY W/ BILATERAL SALPINGOOPHORECTOMY     dysfunctional menses   Patient Active Problem List   Diagnosis Date Noted   Lightheadedness 03/24/2024   Stage 3a chronic kidney disease (HCC) 02/20/2024   Osteoarthritis 02/20/2024   Left leg pain 02/20/2024   Gastritis 01/07/2024   Sweating profusely 01/07/2024   Abnormal CT scan, lung 09/24/2023   Breast tenderness in female 05/23/2023   Bilateral leg edema 04/27/2023   Recurrent bacterial infection 02/07/2023   Arthritis of carpometacarpal (CMC) joint of  right thumb 12/26/2022   Right wrist pain 12/22/2022   Numbness and tingling 10/25/2022   Chronic sinus infection 09/27/2022   Pain of anterior lower extremity 09/27/2022   Left buttock abscess 01/06/2022   Left foot pain 02/03/2021   Degenerative arthritis of knee, bilateral 11/24/2020   Right ankle effusion 05/10/2020   Elevated alkaline phosphatase level 04/20/2020   Neuralgia 04/20/2020   Hair loss 11/04/2018   Spinal stenosis, 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/13/2018   Atypical chest pain 05/21/2018   Epigastric pain 05/21/2018   Baker's cyst of knee, right 05/07/2018   Perennial and seasonal allergic rhinitis 04/18/2018   Onychomycosis of toenail 03/04/2018   Hypercalcemia 09/17/2017   Left wrist pain 09/17/2017   Synovitis of left knee 05/22/2017   Diabetes (HCC) 05/02/2017   Trigger point of left shoulder region 03/27/2017   Taste disorder 02/22/2017   Anxiety and depression 02/12/2017   Caregiver with fatigue 01/17/2017   Psychosocial stressors 01/17/2017   Fatigue 01/15/2017   Decreased sense of taste 01/15/2017   Family history of osteoporosis 11/16/2016   Atherosclerotic plaque 11/07/2016   Chronic or recurrent subluxation of peroneal tendon of left foot 09/13/2016   Osteoporosis 06/21/2016   Dizziness 05/16/2016   Arthritis of left shoulder region 01/17/2016   Ankylosing spondylitis (HCC) 11/08/2015   Incontinence 10/28/2015   Thoracic back pain 10/19/2015   Leg cramping 09/29/2015   Primary osteoarthritis of left shoulder 08/03/2015   Impingement syndrome of left shoulder region 09/22/2014   Interstitial cystitis 07/09/2014   Diverticulosis of colon without hemorrhage 07/08/2014   Carpal tunnel syndrome of left wrist 04/12/2014   Food allergy  03/06/2014   Renal calculi 01/30/2014   OSA (obstructive sleep apnea), moderate 01/14/2014   Allergic conjunctivitis 12/06/2013   History of Bell's palsy 02/26/2012   Vitamin D  deficiency 06/27/2010    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   DRY MOUTH 06/27/2010   Gastroesophageal reflux disease 04/30/2009   History of colon polyps 04/30/2009   Hyperlipidemia 08/21/2007   Essential hypertension 08/21/2007   Polyarthralgia 08/21/2007   Chronic lumbar radiculopathy 01/25/2007    PCP: Geofm Hastings MD REFERRING PROVIDER: Geofm Hastings MD  REFERRING DIAG: dizziness  THERAPY DIAG:  Dizziness  Difficulty in walking, not elsewhere classified  ONSET DATE: 2 months ago  Rationale for Evaluation and Treatment: Rehabilitation  SUBJECTIVE:   SUBJECTIVE STATEMENT: 78 y/o referred to PT by PCP for dizziness.  States symptoms started 2 months ago for no apparent reason. Denies any falls or head trauma.  Doesn't recall hitting anything hitting her head.   States she can be walking along when she starts  to feel she is falling to the R.  Symptoms are intermittent and occur more with movement usually but not necessarily with getting up/down from bed.   She states they can occur randomly when she is walking.   Howver, she can be sitting still and nothing is moving, but she still feels a sense of movement.  She denies headaches, ringing in ears, or any change in her chronic neck/back pain.  She denies any neck surgeries.  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  Brain MRI showed no acute abnormalities or significant changes from an MRI 2 years ago.  Pt accompanied by: self  PERTINENT HISTORY: Diabetes, ankylosing spondylitis, polyarthralgia, multijoint OA, chronic lumbar radiculopathy, osteopenia, PSVT, aortic insufficiency  PAIN:  Are you having pain? Yes: NPRS scale: 5/10 now; 8/10 worst Pain location: back, neck, L shoulder, multi joint OA Pain description: aching of varying intensity Aggravating factors: Lying flat on back, bending, lifting, prolonged walking Relieving factors: sitting, rest, heat, meds  PRECAUTIONS: Other: osteopenia  RED  FLAGS: None   WEIGHT BEARING RESTRICTIONS: No  FALLS: Has patient fallen in last 6 months? No  LIVING ENVIRONMENT: Lives with: lives with their family and lives with their spouse Lives in: House/apartment Stairs: Yes: Internal: 14 steps; on right going up and External: 1 steps; none Has following equipment at home: None  PLOF: Independent with gait  PATIENT GOALS: get rid of dizziness  OBJECTIVE:  Note: Objective measures were completed at Evaluation unless otherwise noted.  DIAGNOSTIC FINDINGS:  Narrative & Impression  CLINICAL DATA:  Provided history: Dizziness. Additional history provided: Lightheadedness, prior injury in the 1960s.   EXAM: MRI HEAD WITHOUT CONTRAST   TECHNIQUE: Multiplanar, multiecho pulse sequences of the brain and surrounding structures were obtained without intravenous contrast.   COMPARISON:  Non-contrast head CT and CT angiogram head/neck 01/18/2022. Brain MRI 01/18/2022.   FINDINGS: Brain:   Mild generalized cerebral atrophy.   Multifocal T2 FLAIR hyperintense signal abnormality within the cerebral white matter and pons, nonspecific but compatible with mild-to-moderate chronic small vessel ischemic disease.   No cortical encephalomalacia is identified.   There is no acute infarct.   No evidence of an intracranial mass.   No chronic intracranial blood products.   No extra-axial fluid collection.   No midline shift.   Vascular: Maintained flow voids within the proximal large arterial vessels.   Skull and upper cervical spine: No focal worrisome marrow lesion. Multilevel facet arthropathy within the cervical spine at the visible levels.   Sinuses/Orbits: No mass or acute finding within the imaged orbits. Prior bilateral ocular lens replacement. No significant paranasal sinus disease.   IMPRESSION: 1.  No evidence of an acute intracranial abnormality. 2. Mild-to-moderate chronic small vessel ischemic changes within  the cerebral white matter, similar to the prior MRI of 01/18/2022. 3. Mild generalized cerebral atrophy.     Electronically Signed   By: Rockey Childs D.O.   On: 04/01/2024 08:22    COGNITION: Overall cognitive status: Within functional limits for tasks assessed   SENSATION: WFL   MUSCLE TONE: WNL    POSTURE: forward head, elevated shoulders   Cervical ROM:    Active A/PROM (deg) eval  Flexion 100  Extension 40  Right lateral flexion 70  Left lateral flexion 70  Right rotation 80  Left rotation 60  (Blank rows = not tested)    LOWER EXTREMITY MMT:   MMT Right eval Left eval  Hip flexion    Hip abduction    Hip adduction    Hip internal rotation    Hip external rotation    Knee flexion    Knee extension    Ankle dorsiflexion    Ankle plantarflexion    Ankle inversion    Ankle eversion    (Blank rows = not tested)  BED MOBILITY:  Sit to supine Complete Independence Supine to sit Complete Independence  TRANSFERS: Assistive device utilized: None  Sit to stand: Complete Independence Stand to sit: Complete Independence Chair to chair: Complete Independence Floor: NT  GAIT: Gait pattern: rigid trunk with minimal trunk or neck movement Distance walked: into clinic Assistive device utilized: None Level of assistance: Complete Independence Comments: no overt symptoms of imbalance or dizziness with gait in clinic today, but patient states it's not always present  FUNCTIONAL TESTS:  TBD  PATIENT SURVEYS:  DHI: THE DIZZINESS HANDICAP INVENTORY (DHI)  P1. Does looking up increase your problem? 0 = No  E2. Because of your problem, do you feel frustrated? 2 = Sometimes  F3. Because of your problem, do you restrict your travel for business or recreation?  0 = No  P4. Does walking down the aisle of a supermarket increase your problems?  2 = Sometimes  F5. Because of your problem, do you have difficulty getting into or out of bed?  2 = Sometimes  F6.  Does your problem significantly restrict your participation in social activities, such as going out to dinner, going to the movies, dancing, or going to parties? 0 = No  F7. Because of your problem, do you have difficulty reading?  0 = No  P8. Does performing more ambitious activities such as sports, dancing, household chores (sweeping or putting dishes away) increase your problems?  2 = Sometimes  E9. Because of your problem, are you afraid to leave your home without having without having someone accompany you?  0 = No  E10. Because of your problem have you been embarrassed in front of others?  2 = Sometimes  P11. Do quick movements of your head increase your problem?  2 = Sometimes  F12. Because of your problem, do you avoid heights?  0 = No  P13. Does turning over in bed increase your problem?  0 = No  F14. Because of your problem, is it difficult for you to do strenuous homework or yard work? 0 = No  E15. Because of your problem, are you afraid people may think you are intoxicated? 0 = No  F16. Because of your problem, is it difficult for you to go for a walk by yourself?  0 = No  P17. Does walking down a sidewalk increase your problem?  2 = Sometimes  E18.Because of your problem, is it difficult for you to concentrate 0 = No  F19. Because of your problem, is it difficult for you to walk around your house in the dark? 0 = No  E20. Because of your problem, are you afraid to stay home alone?  0 = No  E21. Because of your problem, do you feel handicapped? 0 = No  E22. Has the problem placed stress on your relationships with members of your family or friends? 0 = No  E23. Because of your problem, are you depressed?  0 = No  F24. Does your problem interfere with your job or household responsibilities?  0 = No  P25. Does bending over increase your problem?  2 = Sometimes  TOTAL 16    DHI Scoring Instructions  The patient is asked to answer each question as it pertains to dizziness or  unsteadiness problems, specifically  considering their condition during the last month. Questions are designed to incorporate functional (F), physical  (P), and emotional (E) impacts on disability.   Scores greater than 10 points should be referred to balance specialists for further evaluation.   16-34 Points (mild handicap)  36-52 Points (moderate handicap)  54+ Points (severe handicap)  Minimally Detectable Change: 17 points ([address] [place], 1990)  [place], G. SHAUNNA. and [place], C. W. (1990). The development of the Dizziness Handicap Inventory. Archives of Otolaryngology - Head and Neck Surgery 116(4): [id number].   VESTIBULAR ASSESSMENT:  GENERAL OBSERVATION: normal appearing 78 y/o;  has forward head severe   SYMPTOM BEHAVIOR:  Subjective history: (see subjective note above)  Non-Vestibular symptoms: neck pain and nausea/vomiting  Type of dizziness: Funny feeling in the head and World moves  Frequency: various times throughout the day, often without warning  Duration: a few seconds to a few minutes  Aggravating factors: Spontaneous, Induced by position change: sit to stand, and Occurs when standing still   Relieving factors: medication  Progression of symptoms: unchanged  OCULOMOTOR EXAM:  Ocular Alignment: normal  Ocular ROM: No Limitations  Spontaneous Nystagmus: absent  Gaze-Induced Nystagmus: absent  Smooth Pursuits: intact  Saccades: intact  Convergence/Divergence:    FRENZEL - FIXATION SUPRESSED:  Ocular Alignment: normal  Spontaneous Nystagmus: absent  Gaze-Induced Nystagmus: right beating with left gaze  Horizontal head shaking - induced nystagmus: needs retest  Vertical head shaking - induced nystagmus: needs retest  Positional tests: Right Dix-Hallpike: no nystagmus Left Dix-Hallpike: no nystagmus Right Roll Test: no nystagmus Left Roll Test: no nystagmus  Pressure tests: n/a  VESTIBULAR - OCULAR REFLEX:   Slow VOR: Positive Right  VOR Cancellation:  Normal  Head-Impulse Test:   Dynamic Visual Acuity: Dynamic: WNL   MOTION SENSITIVITY:  Motion Sensitivity Quotient Intensity: 0 = none, 1 = Lightheaded, 2 = Mild, 3 = Moderate, 4 = Severe, 5 = Vomiting  Intensity  1. Sitting to supine 0  2. Supine to L side 1  3. Supine to R side 1  4. Supine to sitting 0  5. L Hallpike-Dix 1  6. Up from L  0  7. R Hallpike-Dix 1  8. Up from R  0  9. Sitting, head tipped to L knee 0  10. Head up from L knee 0  11. Sitting, head tipped to R knee 0  12. Head up from R knee 0  13. Sitting head turns x5 2  14.Sitting head nods x5 2  15. In stance, 180 turn to L  2  16. In stance, 180 turn to R 2    OTHOSTATICS: standing BP is 120/60 and no dizziness after transfer sit to stand  FUNCTIONAL GAIT: TBD  Balance:    unable to tandem stand, Sharpened Romberg is positive with LOB to the R  TREATMENT DATE:  04/24/24 SELF CARE: Provided education on PT POC progression.  Gaze Adaptation:  x1 Viewing Horizontal: Position: seated and x1 Viewing Vertical:  Position: seated x 20 reps each  PATIENT EDUCATION: Education details: initial HEP, safety at home, slow position changes Person educated: Patient Education method: Explanation, Demonstration, Tactile cues, Verbal cues, and Handouts Education comprehension: verbalized understanding, verbal cues required, tactile cues required, and needs further education  HOME EXERCISE PROGRAM:  Access Code: [email or web address] URL: [email or web address] Date: 04/24/2024 Prepared by: Garnette Montclair  Exercises - Seated Diagonal Saccades  - 1 x daily - 7 x weekly - 3 sets - 10 reps - Seated Horizontal Smooth Pursuit  - 1 x daily - 7 x weekly - 3 sets - 10 reps - Seated Vertical Smooth Pursuit  - 1 x daily - 7 x weekly - 3 sets - 10 reps - Seated Gaze Stabilization with Head Rotation   - 1 x daily - 7 x weekly - 3 sets - 10 reps - Seated Gaze Stabilization with Head Nod  - 1 x daily - 7 x weekly - 3 sets - 10 reps  GOALS: Goals reviewed with patient? Yes  SHORT TERM GOALS: Target date: 05/22/2024   Patient will report 25% subjective improvement in dizziness symptoms with housework and ADLs Baseline:  completes all required work, but with dizziness and unsteadiness Goal status: INITIAL  2.  Patient will be independent in initial HEP Baseline: 100% PT assist required for correct completion Goal status: INITIAL   LONG TERM GOALS: Target date: 06/19/2024    1.  Patient will report 75-100% subjective improvement in dizziness symptoms Baseline:  Goal status: INITIAL  2.  Patient will be independent with advanced HEP for dizziness, unsteadiness Baseline: no advanced HEP yet Goal status: INITIAL  3.  Patient Dizziness Handicap Inventory score will improve to </= 0 (MCID = 17 pts) Baseline: 16 Goal status: INITIAL  4.  Patient will improve FGA score to 24/30 to decrease fall risk Baseline: TBD Goal status: INITIAL  5.  Patient will be able to perform VOR exercises with minimal to no nystagmus for improved gaze stability with head movements Baseline: R beating nystagmus present Goal status: INITIAL  ASSESSMENT:  CLINICAL IMPRESSION: Patient is an 78 y.o. female who was seen today for physical therapy evaluation and treatment for dizziness.   Symptoms started insidiously 2 months ago without any falls or head trauma.   Patient reports dizziness with getting up/down from bed, but her positional testing today is negative.   She is taking meclizine  so this may be decreasing her symptoms.   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   Her balance is also impaired.   She denies any falls to date, but admits she has lost her balance to the R  numerous times recently but has been able to take a quick step to the R and catch herself each time.  Fortunately she has had no falls yet, but she is a fall risk.   Treniece would benefit from PT to address VOR, gaze stability, balance, safety, HEP deficits as well as sensation/symptom of moving when she is not moving.   She is agreeable to the POC.    OBJECTIVE IMPAIRMENTS: decreased balance, difficulty walking, and dizziness.   ACTIVITY LIMITATIONS: carrying, bending, transfers, bed mobility, and locomotion level  PARTICIPATION LIMITATIONS: cleaning, laundry, driving, shopping,  and community activity  PERSONAL FACTORS: Age, Fitness, Past/current experiences, Time since onset of injury/illness/exacerbation, and 3+ comorbidities: Diabetes, ankylosing spondylitis, polyarthralgia, multijoint OA, chronic lumbar radiculopathy, PSVT, aortic insufficiency are also affecting patient's functional outcome.   REHAB POTENTIAL: Good  CLINICAL DECISION MAKING: Evolving/moderate complexity  EVALUATION COMPLEXITY: High   PLAN:  PT FREQUENCY: 1-2x/week  PT DURATION: 8 weeks  PLANNED INTERVENTIONS: 97164- PT Re-evaluation, 97750- Physical Performance Testing, 97110-Therapeutic exercises, 97530- Therapeutic activity, [id number]- Neuromuscular re-education, 97535- Self Care, 02859- Manual therapy, [id number]- Gait training, [phone]- Canalith repositioning, H9716- Electrical stimulation (unattended), [id number]- Ultrasound, 02987- Traction (mechanical), [id number]- Ionotophoresis 4mg /ml Dexamethasone , [id number] (1-2 muscles), 20561 (3+ muscles)- Dry Needling, Patient/Family education, Balance training, Stair training, Taping, Joint mobilization, Spinal mobilization, Cryotherapy, and Moist heat  PLAN FOR NEXT SESSION: review HEP and see how the exercises are going;  advance with hypofunction treatment  PHYSICAL THERAPY DISCHARGE SUMMARY  Visits from Start of Care: 1  Current functional level related to goals / functional  outcomes: NO IMPROVEMENT.   SEEN FOR INITIAL VISIT ONLY, BUT CANCELLED ALL FUTURE VISITS DUE TO SPOUSE RECEIVING CHEMO   Remaining deficits: UNCHANGED   Education / Equipment: INITIAL HEP PROVIDED   Patient agrees to discharge. Patient goals were not met. Patient is being discharged due to not returning since the last visit.   Ladina Shutters, PT 04/24/2024, 8:28 PM

## 2024-04-24 ENCOUNTER — Other Ambulatory Visit: Payer: Self-pay

## 2024-04-24 ENCOUNTER — Ambulatory Visit: Attending: Internal Medicine | Admitting: Rehabilitation

## 2024-04-24 ENCOUNTER — Encounter: Payer: Self-pay | Admitting: Rehabilitation

## 2024-04-24 DIAGNOSIS — R262 Difficulty in walking, not elsewhere classified: Secondary | ICD-10-CM | POA: Diagnosis not present

## 2024-04-24 DIAGNOSIS — R42 Dizziness and giddiness: Secondary | ICD-10-CM | POA: Diagnosis not present

## 2024-04-29 ENCOUNTER — Ambulatory Visit

## 2024-04-30 ENCOUNTER — Other Ambulatory Visit

## 2024-04-30 ENCOUNTER — Ambulatory Visit: Payer: Self-pay | Admitting: Internal Medicine

## 2024-04-30 ENCOUNTER — Ambulatory Visit (INDEPENDENT_AMBULATORY_CARE_PROVIDER_SITE_OTHER): Admitting: Internal Medicine

## 2024-04-30 ENCOUNTER — Encounter: Payer: Self-pay | Admitting: Internal Medicine

## 2024-04-30 VITALS — BP 120/72 | HR 64 | Ht 62.5 in | Wt 160.0 lb

## 2024-04-30 DIAGNOSIS — K2100 Gastro-esophageal reflux disease with esophagitis, without bleeding: Secondary | ICD-10-CM

## 2024-04-30 DIAGNOSIS — R1013 Epigastric pain: Secondary | ICD-10-CM

## 2024-04-30 DIAGNOSIS — D649 Anemia, unspecified: Secondary | ICD-10-CM | POA: Diagnosis not present

## 2024-04-30 DIAGNOSIS — K582 Mixed irritable bowel syndrome: Secondary | ICD-10-CM | POA: Diagnosis not present

## 2024-04-30 DIAGNOSIS — Z860101 Personal history of adenomatous and serrated colon polyps: Secondary | ICD-10-CM

## 2024-04-30 DIAGNOSIS — Z8719 Personal history of other diseases of the digestive system: Secondary | ICD-10-CM

## 2024-04-30 NOTE — Progress Notes (Signed)
 Subjective:    Patient ID: [name], female    DOB: [birth year], 78 y.o.   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  She experiences a recurrence of loose bowel movements after a period of normal bowel habits, with incontinence particularly at night, leading to soiling upon waking. When she gets the sensation to defecate, it often passes involuntarily in small amounts. She describes difficulty with bowel movements, noting that they are 'round balls of different sizes' that collect in a pouch, requiring manual evacuation. This has become increasingly difficult over the past four to five days.  She has a history of diverticulitis diagnosed by CT scan and treated with Augmentin  in April 2025. Her last colonoscopy on October 08, 2023, revealed four polyps, all less than five millimeters, and diverticulosis in the sigmoid and distal descending colon. The polyps were tubular adenomas.  She experiences epigastric pain, which she describes as different from gas pain, and sometimes feels nauseated. She associates these symptoms with her eating habits, noting that pain occurs both before and after meals. She reports taking Nexium  'when I remember' and asked about the appropriate timing and frequency of her Nexium  dosage.  Her current medications include Nexium  40 mg daily, dicyclomine  5 mg every eight hours as needed, B12 daily, and Zofran  as needed.  She has been under a significant amount of stress lately as [name] has been diagnosed with AML.  He is getting treatment at [facility] and is requiring chemotherapy but also frequent blood and platelet transfusions.  Just speaking of this makes her quite emotional today.  Review of Systems As per HPI, otherwise negative  Current Medications, Allergies, Past Medical History, Past Surgical History, Family History and Social  History were reviewed in [employer] record.     Objective:   Physical Exam BP 120/72   Pulse 64   Ht 5' 2.5 (1.588 m)   Wt 160 lb (72.6 kg)   BMI 28.80 kg/m  Gen: awake, alert, NAD HEENT: anicteric  Ext: no c/c/e Neuro: nonfocal     Latest Ref Rng & Units 04/17/2024    2:40 PM 02/01/2024    7:27 PM 10/31/2023   11:32 AM  CBC  WBC 4.0 - 10.5 K/uL 6.2  6.8  5.9   Hemoglobin 12.0 - 15.0 g/dL 88.2  88.2  88.3   Hematocrit 36.0 - 46.0 % 35.1  36.0  34.8   Platelets 150.0 - 400.0 K/uL 279.0  236  244.0    Iron/TIBC/Ferritin/ %Sat    Component Value Date/Time   IRON 42 10/31/2023 1634   IRON 79 02/03/2020 1111   TIBC 324.8 10/31/2023 1634   TIBC 299 02/03/2020 1111   FERRITIN 69.5 10/31/2023 1634   FERRITIN 95 02/03/2020 1111   IRONPCTSAT 12.9 (L) 10/31/2023 1634   IRONPCTSAT 26 02/03/2020 1111   CMP     Component Value Date/Time   NA 141 04/17/2024 1440   NA 141 08/15/2023 1553   K 4.0 04/17/2024 1440   CL 108 04/17/2024 1440   CO2 27 04/17/2024 1440   GLUCOSE 91 04/17/2024 1440   GLUCOSE 99 07/23/2006 1101   BUN 14 04/17/2024 1440   BUN 22 08/15/2023 1553   CREATININE 0.96 04/17/2024 1440   CREATININE 1.09 (H) 03/20/2018 0900   CALCIUM  9.7 04/17/2024 1440   PROT 7.3 04/17/2024 1440  PROT 7.2 04/17/2022 0852   ALBUMIN 4.3 04/17/2024 1440   ALBUMIN 4.4 04/17/2022 0852   AST 32 04/17/2024 1440   ALT 31 04/17/2024 1440   ALKPHOS 88 04/17/2024 1440   BILITOT 0.3 04/17/2024 1440   BILITOT 0.3 04/17/2022 0852   GFR 56.00 (L) 04/17/2024 1440   EGFR 53 (L) 08/15/2023 1553   GFRNONAA 49 (L) 02/01/2024 1927   GFRNONAA 50 (L) 03/20/2018 0900    ABDOMEN - 1 VIEW   COMPARISON:  02/01/2024   FINDINGS: Nonobstructive bowel gas pattern.Small volume fecal loading predominantly in the right colon.No pneumoperitoneum. No organomegaly or radiopaque calculi. Multilevel degenerative disc disease of the spine. 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   IMPRESSION: Nonobstructive bowel gas pattern. Small volume fecal loading predominantly in the right colon.     Electronically Signed   By: Rogelia Myers M.D.   On: 04/17/2024 15:11    CT ABDOMEN AND PELVIS WITH CONTRAST   TECHNIQUE: Multidetector CT imaging of the abdomen and pelvis was performed using the standard protocol following bolus administration of intravenous contrast.   RADIATION DOSE REDUCTION: This exam was performed according to the departmental dose-optimization program which includes automated exposure control, adjustment of the mA and/or kV according to patient size and/or use of iterative reconstruction technique.   CONTRAST:  75mL OMNIPAQUE  IOHEXOL  350 MG/ML SOLN   COMPARISON:  CT pelvis dated 01/19/2022.   FINDINGS: Lower chest: The visualized lung bases are clear.   No intra-abdominal free air or free fluid.   Hepatobiliary: Fatty liver. No biliary ductal dilatation. The gallbladder is unremarkable.   Pancreas: Unremarkable. No pancreatic ductal dilatation or surrounding inflammatory changes.   Spleen: Normal in size without focal abnormality.   Adrenals/Urinary Tract: The adrenal glands are unremarkable. There is no hydronephrosis on either side. There is symmetric enhancement and excretion of contrast by both kidneys. The visualized ureters and urinary bladder appear unremarkable.   Stomach/Bowel: There is sigmoid diverticulosis. There is inflammatory changes adjacent to the sigmoid diverticula consistent with acute diverticulitis. No abscess or perforation. Moderate stool throughout the colon. There is no bowel obstruction. The appendix is normal.   Vascular/Lymphatic: Mild aortoiliac atherosclerotic disease. The IVC is unremarkable. No portal venous gas. There is no adenopathy.   Reproductive: Hysterectomy.  No suspicious adnexal masses.   Other: None   Musculoskeletal: Osteopenia with degenerative changes.  No acute osseous pathology.   IMPRESSION: 1. Acute sigmoid diverticulitis. No abscess or perforation. 2. Fatty liver.     Electronically Signed   By: Vanetta Chou M.D.   On: 11/01/2023 11:16   DIAGNOSTIC Colonoscopy: Four polyps less than 5 mm, tubular adenoma, diverticulosis in sigmoid and distal descending colon (10/08/2023) Upper Endoscopy: LA grade A esophagitis, dilation with Maloney dilator 52 French, scattered erythema and granularity in gastric antrum, mild duodenal bulb inflammation, normal second portion of duodenum (10/08/2023)  PATHOLOGY Gastric Biopsy: Antral mucosa with mild chronic and active gastritis, focal intestinal metaplasia, negative H. pylori special stain (10/08/2023)    Assessment & Plan:  Irritable bowel syndrome with fecal incontinence and alternating bowel habits Alternating bowel habits with fecal incontinence, likely exacerbated by recent antibiotic use and pelvic floor dysfunction. - Initiate Metamucil, 2 tablespoons daily. - Advised raising feet on a stool during defecation. - Consider referral to pelvic floor physical therapy if symptoms persist. - Instructed to use dicyclomine  as needed for cramping, up to three times daily.  Gastroesophageal reflux disease with esophagitis and epigastric pain GERD with esophagitis  and epigastric pain, inconsistent Nexium  use noted. - Instructed to take Nexium  40 mg daily in the morning before meals.  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 pylori negative. - No specific follow-up EGD needed for this  Colonic diverticulosis/diverticulitis April 2025 Colonic diverticulosis in sigmoid and distal descending colon, history of acute diverticulitis in April 2025. - Resolved with antibiotics  Colonic polyps, tubular adenoma type, post-polypectomy surveillance Colonoscopy revealed four polyps, all less than five millimeters in size. -- Consider  repeat colonoscopy based on overall health March 2028  Normocytic anemia, unspecified Slightly low hemoglobin at 11.7 g/dL, normocytic anemia, cause unspecified.  Stable hemoglobin x 6 months. - Recheck iron levels, B12, and folate.  6-week follow-up with Alan Coombs, PA-C 12 to 14-week follow-up with me  30 minutes total spent today including patient facing time, coordination of care, reviewing medical history/procedures/pertinent radiology studies, and documentation of the encounter.

## 2024-04-30 NOTE — Patient Instructions (Addendum)
 Your provider has requested that you go to the basement level for lab work before leaving today. Press B on the elevator. The lab is located at the first door on the left as you exit the elevator.  Take your Nexium  every day.   Continue dicyclomine  as needed.   Please purchase the following medications over the counter and take as directed: Metamucil 2 tablespoons daily.   Please follow up with Alan Leek, PA on 06/12/24 at 9:20 am. We have also scheduled you to see Dr. Albertus on 07/22/24 at 3:00 pm.  _______________________________________________________  If your blood pressure at your visit was 140/90 or greater, please contact your primary care physician to follow up on this.  _______________________________________________________  If you are age 20 or older, your body mass index should be between 23-30. Your Body mass index is 28.8 kg/m. If this is out of the aforementioned range listed, please consider follow up with your Primary Care Provider.  If you are age 57 or younger, your body mass index should be between 19-25. Your Body mass index is 28.8 kg/m. If this is out of the aformentioned range listed, please consider follow up with your Primary Care Provider.   ________________________________________________________  The [facility] providers would like to encourage you to use MYCHART to communicate with providers for non-urgent requests or questions.  Due to long hold times on the telephone, sending your provider a message by [facility] may be a faster and more efficient way to get a response.  Please allow 48 business hours for a response.  Please remember that this is for non-urgent requests.  _______________________________________________________  [facility] is using a team-based approach to care.  Your team is made up of your doctor and two to three APPS. Our APPS (Nurse Practitioners and Physician Assistants) work with your physician to ensure care  continuity for you. They are fully qualified to address your health concerns and develop a treatment plan. They communicate directly with your gastroenterologist to care for you. Seeing the Advanced Practice Practitioners on your physician's team can help you by facilitating care more promptly, often allowing for earlier appointments, access to diagnostic testing, procedures, and other specialty referrals.

## 2024-05-01 ENCOUNTER — Ambulatory Visit: Payer: Self-pay | Admitting: Internal Medicine

## 2024-05-01 ENCOUNTER — Other Ambulatory Visit: Payer: Self-pay

## 2024-05-01 DIAGNOSIS — D649 Anemia, unspecified: Secondary | ICD-10-CM

## 2024-05-01 LAB — IBC + FERRITIN
Ferritin: 61.9 ng/mL (ref 10.0–291.0)
Iron: 69 ug/dL (ref 42–145)
Saturation Ratios: 17.7 % — ABNORMAL LOW (ref 20.0–50.0)
TIBC: 390.6 ug/dL (ref 250.0–450.0)
Transferrin: 279 mg/dL (ref 212.0–360.0)

## 2024-05-01 LAB — FOLATE: Folate: 20.5 ng/mL (ref >5.9)

## 2024-05-02 NOTE — Progress Notes (Signed)
 Interstitial Lung Disease Multidisciplinary Conference   [name]    MRN [id number]    DOB 09-16-1945  Primary Care Physician:Burns, Glade [name], MD  Referring Physician: Dr. Meade  Time of Conference: 7.30am- 8.30am Date of conference: 04/30/2024 Location of Conference: -  Virtual  Participating Pulmonary: Dr. Dorethia Cave, Dr Lonna Coder Pathology: Dr Katrine Muskrat Radiology: Dr Selinda Blue  Others: -  Brief History: Incidental abnormal CT Chest findings from CT Cardiac scan. 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 Pfts normal. Patient also has some gerd. Question for the group - is this IPF?   PFT    Latest Ref Rng & Units 01/28/2024    9:14 AM  PFT Results  FVC-Pre L 1.98   FVC-Predicted Pre % 87   FVC-Post L 2.20   FVC-Predicted Post % 97   Pre FEV1/FVC % % 87   Post FEV1/FCV % % 87   FEV1-Pre L 1.73   FEV1-Predicted Pre % 103   FEV1-Post L 1.91   DLCO uncorrected ml/min/mmHg 12.01   DLCO UNC% % 71   DLCO corrected ml/min/mmHg 12.01   DLCO COR %Predicted % 71   DLVA Predicted % 82   TLC L 4.10   TLC % Predicted % 89   RV % Predicted % 85       MDD discussion of CT scan    - Date or time period of scan: HRCT: 12/17/2023   - Discussion synopsis:  May 2025 HRCT - mild patchy periopehral reticulation. No HC. Has TB. Mild progression from 2018 cardiac CT to now. 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 REcommend a) anti-fibrotics b) IPF-PRO expansion registry participation   Time Spent in preparation and discussion:  > 30 min    SIGNATURE   Dr. Dorethia Cave, M.D., F.C.C.P,  Pulmonary and Critical Care Medicine Staff Physician, [facility] Director - Interstitial Lung Disease  Program   Pulmonary Fibrosis [facility] at [facility] [place], [place], [address]  Pager: [phone], If no answer or between  15:00h - 7:00h: call [phone] Telephone: [phone]  1:00 PM 05/02/2024 ...................................................................................................................SABRA References: Diagnosis of Hypersensitivity Pneumonitis in Adults. An Official ATS/JRS/ALAT Clinical Practice Guideline. Ragu G et al, Am J Respir Crit Care Med. 2020 Aug 1;202(3):e36-e69.       Diagnosis of Idiopathic Pulmonary Fibrosis. An Official ATS/ERS/JRS/ALAT Clinical Practice Guideline. Raghu G et al, Am J Respir Crit Care Med. 2018 Sep 1;198(5):e44-e68.   IPF Suspected   Histopath ology Pattern      UIP  Probable UIP  Indeterminate for  UIP  Alternative  diagnosis    UIP  IPF  IPF  IPF  Non-IPF dx   HRCT   Probabe UIP  IPF  IPF  IPF (Likely)**  Non-IPF dx  Pattern  Indeterminate for UIP  IPF  IPF (Likely)**  Indeterminate  for IPF**  Non-IPF dx    Alternative diagnosis  IPF (Likely)**/ non-IPF dx  Non-IPF dx  Non-IPF dx  Non-IPF dx     Idiopathic pulmonary fibrosis diagnosis based upon HRCT and Biopsy paterns.  ** IPF is the likely diagnosis when any of following features are present:  Moderate-to-severe traction bronchiectasis/bronchiolectasis (defined as mild traction  bronchiectasis/bronchiolectasis in four or more lobes including the lingual as a lobe, or moderate to severe traction bronchiectasis in two or more lobes) in a man over age 80 years or in a woman over age 80 years Extensive (>30%) reticulation on HRCT and an age >80 years  Increased neutrophils and/or absence of lymphocytosis in BAL fluid  Multidisciplinary discussion reaches a confident diagnosis of IPF.   **Indeterminate for IPF  Without an adequate biopsy is unlikely to be IPF  With an adequate biopsy may be reclassified to a more  specific diagnosis after multidisciplinary discussion and/or additional consultation.   dx = diagnosis; HRCT = high-resolution computed tomography; IPF = idiopathic pulmonary fibrosis; UIP = usual interstitial pneumonia.

## 2024-05-05 ENCOUNTER — Ambulatory Visit: Admitting: Internal Medicine

## 2024-05-07 ENCOUNTER — Encounter: Admitting: Rehabilitation

## 2024-05-12 ENCOUNTER — Encounter: Payer: Self-pay | Admitting: Internal Medicine

## 2024-05-12 ENCOUNTER — Ambulatory Visit: Admitting: Internal Medicine

## 2024-05-12 VITALS — BP 106/76 | HR 55 | Temp 98.4°F | Ht 62.0 in | Wt 160.0 lb

## 2024-05-12 DIAGNOSIS — N3946 Mixed incontinence: Secondary | ICD-10-CM | POA: Diagnosis not present

## 2024-05-12 DIAGNOSIS — R159 Full incontinence of feces: Secondary | ICD-10-CM | POA: Diagnosis not present

## 2024-05-12 DIAGNOSIS — N3281 Overactive bladder: Secondary | ICD-10-CM | POA: Diagnosis not present

## 2024-05-12 DIAGNOSIS — Z91018 Allergy to other foods: Secondary | ICD-10-CM | POA: Diagnosis not present

## 2024-05-12 DIAGNOSIS — R9389 Abnormal findings on diagnostic imaging of other specified body structures: Secondary | ICD-10-CM | POA: Diagnosis not present

## 2024-05-12 DIAGNOSIS — J84112 Idiopathic pulmonary fibrosis: Secondary | ICD-10-CM | POA: Diagnosis not present

## 2024-05-12 DIAGNOSIS — N952 Postmenopausal atrophic vaginitis: Secondary | ICD-10-CM | POA: Diagnosis not present

## 2024-05-12 MED ORDER — EPINEPHRINE 0.3 MG/0.3ML IJ SOAJ: INTRAMUSCULAR | 1 refills | Status: AC

## 2024-05-12 NOTE — Patient Instructions (Addendum)
 It was a pleasure to see you today!  Please schedule follow up with Dr Adrien in 4 months.  If my schedule is not open yet, we will contact you with a reminder closer to that time. Please call [phone] if you haven't heard from us  a month before, and always call us  sooner if issues or concerns arise. You can also send us  a message through MyChart, but but aware that this is not to be used for urgent issues and it may take up to 5-7 days to receive a reply. Please be aware that you will likely be able to view your results before I have a chance to respond to them. Please give us  5 business days to respond to any non-urgent results.    Before your next visit I would like you to have:  Spirometry/DLCO in 4 months before next appointment   I am glad to hear your breathing is doing well. Your breathing testing shows normal pulmonary function. Your CT scan shows some very early changes consistent with IPF. Because you are not having any symptoms and your pulmonary function today is normal.  I think we can likely monitor this with serial pulmonary function testing. There are antifibrotic medications available which do not reverse the disease but to help slow down progression.  These medications are Ofev, Esbriet, Jascayd.  For now I would recommend monitoring with breathing testing and if there is a change in your symptoms or change in your breathing testing we would get repeat imaging and consider antifibrotic therapy at that time.

## 2024-05-12 NOTE — Progress Notes (Signed)
 [name]    [id number]    [birth year]  Primary Care Physician:Burns, Glade [name], MD Date of Appointment: 05/12/2024 Established Patient Visit  Chief complaint:   Chief Complaint  Patient presents with   Shortness of Breath    Patient is doing well     HPI: [name] is a 78 y.o. woman with abnormal CT Chest concerning for IPF  Interval Updates: Here for follow up after CT Chest High Resolution as well as PFT.   Has shortness of breath after two flights of stairs. Minimal daily respiratory symptom.   No limitation from daily activities  Autoimmune serologies reviewed form 2024 - unremarkable.    I have reviewed the patient's family social and past medical history and updated as appropriate.   Past Medical History:  Diagnosis Date   Allergy     seasonal   Ankylosing spondylitis (HCC)    Anxiety    Arthritis    Bell palsy    yrs ago   Blood transfusion without reported diagnosis    CAD (coronary artery disease)    a. Coronary CT 10/2016 showed calcium  score in the 59th perecntile, moderate CAD in RCA/PDA and narrowing of the mid to distal LAD, with negative FFR.   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    GERD (gastroesophageal reflux disease)    history of CTS (carpal tunnel syndrome)    History of kidney stones    HLA B27 (HLA B27 positive)    Hx of colonic polyps    Hyperlipidemia    NMR 2005   Hypertension    Hypothyroidism    Incontinence    Internal hemorrhoids    Interstitial cystitis    Kidney stone    Menopause    Mild aortic insufficiency    Mild ascending aorta dilatation    followed by [facility]ology  [facility]   Osteoarthritis    shoulder, ankles, hands   Osteopenia    [facility] [address]   Osteopenia    Pneumonia    2 x as child   Pre-diabetes     PSVT (paroxysmal supraventricular tachycardia)    S/P total hysterectomy and bilateral salpingo-oophorectomy 04/12/2014   Sinusitis    Sleep apnea    c-pap   Subjective visual disturbance of both eyes 12/30/2013   Tubular adenoma of colon    Vitamin D  deficiency    Xerostomia    and Xeroophthalmia(oral dryness)    Past Surgical History:  Procedure Laterality Date   BLADDER SURGERY     for incontinence   CARPAL TUNNEL RELEASE Left 12/22/2014   Procedure: LEFT CARPAL TUNNEL RELEASE;  Surgeon: Arley Curia, MD;  Location: [facility];  Service: Orthopedics;  Laterality: Left;   CARPAL TUNNEL RELEASE Right 02/18/2019   Procedure: RIGHT CARPAL TUNNEL RELEASE;  Surgeon: Curia Arley, MD;  Location:  SURGERY CENTER;  Service: Orthopedics;  Laterality: Right;   cataract excision Bilateral 2022   COLONOSCOPY  2019   COLONOSCOPY W/ POLYPECTOMY     adenomatous poyp 04-2008   COLONOSCOPY WITH ESOPHAGOGASTRODUODENOSCOPY (EGD)  10/08/2023   colonscopy     tics 10-2002   CYSTOSCOPY WITH RETROGRADE PYELOGRAM, URETEROSCOPY AND STENT PLACEMENT Left 09/01/2013  Procedure: CYSTOSCOPY WITH RETROGRADE PYELOGRAM, AND LEFT STENT PLACEMENT;  Surgeon: Toribio Neysa Repine, MD;  Location: [facility];  Service: Urology;  Laterality: Left;   EXCISION OF KELOID N/A 03/09/2023   Procedure: EXCISIONIONAL BIOPSY SEBACEOUS CYST LEFT BUTTOCK;  Surgeon: Debby Hila, MD;  Location: [facility];  Service: General;  Laterality: N/A;   FINGER SURGERY Left 2004   2nd finger   G 1 P 1     interstitial cystitis     with clot in catheter   SHOULDER ARTHROSCOPY Left    TOTAL ABDOMINAL HYSTERECTOMY W/ BILATERAL SALPINGOOPHORECTOMY     dysfunctional menses    Family History  Problem Relation Age of Onset   Coronary artery disease Mother    Hypertension Mother    Asthma Mother    Dementia Mother    Stroke Mother        mini cva   Diabetes Father    Cirrhosis Father         non alcoholic   Colon cancer Sister 73   Allergic rhinitis Sister    Environmental Allergies Sister    Allergic rhinitis Sister    Food Allergy  Sister    Environmental Allergies Sister    Coronary artery disease Maternal Grandfather    Diabetes Paternal Grandmother    Breast cancer Maternal Aunt    Renal cancer Paternal Aunt        x 2   Diabetes Paternal Aunt    Esophageal cancer Neg Hx    Stomach cancer Neg Hx    Colon polyps Neg Hx     Social History   Occupational History   Occupation: retired   Occupation: Runner, broadcasting/film/video    Comment: Kindergarten  Tobacco Use   Smoking status: Never    Passive exposure: Past   Smokeless tobacco: Never  Vaping Use   Vaping status: Never Used  Substance and Sexual Activity   Alcohol use: Yes    Comment: rare   Drug use: No   Sexual activity: Not Currently     Physical Exam: Blood pressure 106/76, pulse (!) 55, temperature 98.4 F (36.9 C), temperature source Oral, height 5' 2 (1.575 m), weight 160 lb (72.6 kg), SpO2 97%.  Gen:    No distress, well preserved 78 yo woman ENT:  mmm Lungs:    ctab no wheezes or crackles CV:         RRR no mrg   Data Reviewed: Imaging: I have personally reviewed the CT Chest May 2025 - mild lower lobe sub-pleural reticulation, no honeycombing, fibrosis, or ground glass.   PFTs:     Latest Ref Rng & Units 01/28/2024    9:14 AM  PFT Results  FVC-Pre L 1.98   FVC-Predicted Pre % 87   FVC-Post L 2.20   FVC-Predicted Post % 97   Pre FEV1/FVC % % 87   Post FEV1/FCV % % 87   FEV1-Pre L 1.73   FEV1-Predicted Pre % 103   FEV1-Post L 1.91   DLCO uncorrected ml/min/mmHg 12.01   DLCO UNC% % 71   DLCO corrected ml/min/mmHg 12.01   DLCO COR %Predicted % 71   DLVA Predicted % 82   TLC L 4.10   TLC % Predicted % 89   RV % Predicted % 85    I have personally reviewed the patient's PFTs and normal pulmonary function  Labs: Lab Results  Component Value Date   NA 141 04/17/2024   K 4.0 04/17/2024    CO2  27 04/17/2024   GLUCOSE 91 04/17/2024   BUN 14 04/17/2024   CREATININE 0.96 04/17/2024   CALCIUM  9.7 04/17/2024   GFR 56.00 (L) 04/17/2024   EGFR 53 (L) 08/15/2023   GFRNONAA 49 (L) 02/01/2024   Lab Results  Component Value Date   WBC 6.2 04/17/2024   HGB 11.7 (L) 04/17/2024   HCT 35.1 (L) 04/17/2024   MCV 86.8 04/17/2024   PLT 279.0 04/17/2024    Immunization status: Immunization History  Administered Date(s) Administered   Fluad Quad(high Dose 65+) 04/02/2019, 04/20/2020, 04/17/2022   Fluad Trivalent(High Dose 65+) 08/22/2023   Fluzone Influenza virus vaccine,trivalent (IIV3), split virus 08/01/2003, 05/29/2007, 04/15/2008, 04/30/2009, 05/03/2010   INFLUENZA, HIGH DOSE SEASONAL PF 05/01/2013, 04/18/2014, 04/13/2016, 05/02/2017, 04/15/2018   Influenza Split 05/09/2011, 05/07/2012   Influenza Whole 08/01/2003, 05/29/2007, 04/15/2008, 04/30/2009, 05/03/2010   Influenza,inj,Quad PF,6+ Mos 05/21/2015   Influenza-Unspecified 05/09/2011, 05/07/2012, 05/21/2015, 05/02/2017   PFIZER(Purple Top)SARS-COV-2 Vaccination 09/13/2019, 10/08/2019, 06/04/2020   Pneumococcal Conjugate-13 02/25/2015   Pneumococcal Polysaccharide-23 07/31/2005, 05/27/2012   Td 06/27/2010   Tdap 08/17/2020   Zoster Recombinant(Shingrix) 06/15/2017, 08/20/2017   Zoster, Live 07/19/2012   Zoster, Unspecified 06/15/2017, 08/20/2017    External Records Personally Reviewed:   Assessment:  Abnormal CT Chest - possible UIP Possible IPF  Plan/Recommendations:  I am glad to hear your breathing is doing well. Your breathing testing shows normal pulmonary function. Your CT scan shows some very early changes consistent with IPF. Because you are not having any symptoms and your pulmonary function today is normal.  I think we can likely monitor this with serial pulmonary function testing. There are antifibrotic medications available which do not reverse the disease but to help slow down progression.  These  medications are Ofev, Esbriet, Jascayd.  For now I would recommend monitoring with breathing testing and if there is a change in your symptoms or change in your breathing testing we would get repeat imaging and consider antifibrotic therapy at that time.  I spent 30 minutes in the care of this patient today including pre-charting, chart review, review of results, face-to-face care, coordination of care and communication with consultants etc.).    Return to Care: Return in about 4 months (around 09/12/2024) for Dr. Adrien.   Verdon Gore, MD Pulmonary and Critical Care Medicine [facility] [phone]

## 2024-05-14 ENCOUNTER — Encounter: Admitting: Rehabilitation

## 2024-05-14 DIAGNOSIS — M25572 Pain in left ankle and joints of left foot: Secondary | ICD-10-CM | POA: Diagnosis not present

## 2024-05-14 DIAGNOSIS — M79672 Pain in left foot: Secondary | ICD-10-CM | POA: Diagnosis not present

## 2024-05-22 ENCOUNTER — Ambulatory Visit: Admitting: Rehabilitation

## 2024-05-29 ENCOUNTER — Encounter: Payer: Self-pay | Admitting: Internal Medicine

## 2024-05-29 NOTE — Progress Notes (Signed)
 Subjective:    Patient ID: [name], female    DOB: 03-30-1946, 77 y.o.   MRN: [id number]      HPI [name] is here for  Chief Complaint  Patient presents with   Hip Pain    Left hip pain and leg pain (asking about Prednisone )     Discussed the use of AI scribe software for clinical note transcription with the patient, who gave verbal consent to proceed.  History of Present Illness [name] is an 78 year old female who presents with worsening mobility issues and pain in the left groin and leg.  She experiences significant pain and mobility issues, primarily in the left groin, extending to the buttock and down the back of the leg to the ankle. She describes pain in the groin, buttock, and down the back of the leg to the ankle, which significantly impacts her ability to walk and move. The pain worsened after completing a medication regimen that initially improved her mobility.  She recently completed a medication regimen that included a Medrol  dose pack. While on the medication, she experienced significant improvement in her ability to move and walk. However, after finishing the medication, her symptoms returned, and she is now experiencing difficulty moving again.  She has a history of ankle pain, for which she was previously prescribed ibuprofen , but was advised to use it sparingly due to past issues with high doses affecting her blood work. She has also been advised to use Tylenol  as an alternative.  She recalls a past incident where she received an injection for pain management, which provided long-term relief, but she cannot recall the specifics of the treatment. She has a history of being double-jointed, which might contribute to her joint issues.  There was a past discussion about gout, but she has not been formally diagnosed with it. She recalls a previous surgery for carpal tunnel syndrome where gout was mentioned, but no definitive diagnosis was made.  No current  ankle pain without pressure, but quick pain upon standing. Reports chronic lower back pain and new disability in walking.  She is also concerned about her recent blood work that showed mild anemia and wanted to have that repeated.       Medications and allergies reviewed with patient and updated if appropriate.  Current Outpatient Medications on File Prior to Visit  Medication Sig Dispense Refill   acetaminophen  (TYLENOL ) 500 MG tablet Take 2 tablets (1,000 mg total) by mouth 3 (three) times daily as needed.     amLODipine  (NORVASC ) 5 MG tablet TAKE 1 TABLET (5 MG TOTAL) BY MOUTH DAILY. 90 tablet 3   ascorbic acid (VITAMIN C ) 1000 MG tablet Take by oral route.     aspirin  81 MG tablet Take 81 mg by mouth daily.     carvedilol  (COREG ) 3.125 MG tablet TAKE 1 TABLET BY MOUTH TWICE A DAY 180 tablet 1   Cholecalciferol (VITAMIN D -3) 25 MCG (1000 UT) CAPS Take 10,000 Units by mouth daily.      cyclobenzaprine  (FLEXERIL ) 10 MG tablet Take 1 tablet (10 mg total) by mouth 2 (two) times daily as needed for muscle spasms. 14 tablet 0   diclofenac  Sodium (VOLTAREN ) 1 % GEL Apply topically 4 (four) times daily as needed.      dicyclomine  (BENTYL ) 10 MG capsule TAKE 1 TO 2 TABLETS THREE TIMES DAILY AS NEEDED     dicyclomine  (BENTYL ) 20 MG tablet Take 0.5 tablets (10 mg total) by mouth  every 8 (eight) hours as needed for spasms (AB pain). 30 tablet 0   EPINEPHrine  (EPIPEN  2-PAK) 0.3 mg/0.3 mL IJ SOAJ injection Use as directed for severe allergic reactions 2 each 1   esomeprazole  (NEXIUM ) 40 MG capsule Take 1 capsule (40 mg total) by mouth daily. Take 30 minutes prior to a meal 90 capsule 1   MAGNESIUM  GLYCINATE PO Take 240 mg by mouth daily.     meclizine  (ANTIVERT ) 12.5 MG tablet Take 1 tablet (12.5 mg total) by mouth 3 (three) times daily as needed for dizziness. 30 tablet 0   Omega-3 Fatty Acids (FISH OIL BURP-LESS) 1000 MG CAPS Takes 1280 mg 2 caps daiily 60 capsule 0   ondansetron  (ZOFRAN -ODT) 4 MG  disintegrating tablet Take 1 tablet (4 mg total) by mouth every 8 (eight) hours as needed for nausea or vomiting. 20 tablet 0   rosuvastatin  (CRESTOR ) 5 MG tablet Take 1 tablet (5 mg total) by mouth daily. 90 tablet 3   Ubiquinol 100 MG CAPS Take 1 tablet by mouth daily.     vitamin B-12 (CYANOCOBALAMIN ) 1000 MCG tablet Take 1,000 mcg by mouth 2 (two) times daily.      vitamin E 400 UNIT capsule Take 400 Units by mouth daily.     zinc  gluconate 50 MG tablet Take 50 mg by mouth daily.     No current facility-administered medications on file prior to visit.    Review of Systems     Objective:   Vitals:   05/30/24 1529  BP: 110/80  Pulse: 80  Temp: 98.3 F (36.8 C)  SpO2: 97%   BP Readings from Last 3 Encounters:  05/30/24 110/80  05/12/24 106/76  04/30/24 120/72   Wt Readings from Last 3 Encounters:  05/30/24 162 lb (73.5 kg)  05/12/24 160 lb (72.6 kg)  04/30/24 160 lb (72.6 kg)   Body mass index is 29.63 kg/m.    Physical Exam Constitutional:      General: She is not in acute distress.    Appearance: Normal appearance. She is not ill-appearing.  HENT:     Head: Normocephalic and atraumatic.  Musculoskeletal:     Right lower leg: No edema.     Left lower leg: No edema.     Comments: No tenderness with palpation.  Pain is focused around left groin around forwards left buttock.  Pain radiating down posterior leg towards foot-leg is nontender to palpation  Skin:    General: Skin is warm and dry.     Findings: No rash.  Neurological:     Mental Status: She is alert.     Sensory: No sensory deficit.     Motor: No weakness.     Gait: Gait abnormal (Slight limp).            Assessment & Plan:    See Problem List for Assessment and Plan of chronic medical problems.   Assessment and Plan Assessment & Plan Chronic pain: left leg pain -- left groin, hip, buttock, and leg with nerve involvement and hip osteoarthritis Pain is disabling, possibly due to nerve  impingement or hip joint issue. Steroid responsive, indicating inflammation. - Prescribe prednisone  taper, once daily with food. - Consider imaging if pain persists. - Refer to orthopedic / sports medicine specialist if needed.   Anemia, unspecified Possible causes include chronic disease or mild chronic kidney disease. - Recheck blood counts, B12 level, and iron levels.  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 - continue lifestyle control - Check urine albumin and A1c.  Hypertension associated with diabetes Chronic and well controlled.  No change in medications - continue amlodipine  5 mg daily, coreg  3.125 mg bid  Hyperlipidemia associated with diabetes Chronic  - continue crestor  5 mg daily    General Health Maintenance Discussed flu vaccination. - Administer flu shot.

## 2024-05-30 ENCOUNTER — Ambulatory Visit: Admitting: Internal Medicine

## 2024-05-30 VITALS — BP 110/80 | HR 80 | Temp 98.3°F | Ht 62.0 in | Wt 162.0 lb

## 2024-05-30 DIAGNOSIS — E785 Hyperlipidemia, unspecified: Secondary | ICD-10-CM

## 2024-05-30 DIAGNOSIS — E1122 Type 2 diabetes mellitus with diabetic chronic kidney disease: Secondary | ICD-10-CM

## 2024-05-30 DIAGNOSIS — Z23 Encounter for immunization: Secondary | ICD-10-CM | POA: Diagnosis not present

## 2024-05-30 DIAGNOSIS — M79605 Pain in left leg: Secondary | ICD-10-CM

## 2024-05-30 DIAGNOSIS — I152 Hypertension secondary to endocrine disorders: Secondary | ICD-10-CM | POA: Diagnosis not present

## 2024-05-30 DIAGNOSIS — E1159 Type 2 diabetes mellitus with other circulatory complications: Secondary | ICD-10-CM | POA: Diagnosis not present

## 2024-05-30 DIAGNOSIS — D649 Anemia, unspecified: Secondary | ICD-10-CM | POA: Diagnosis not present

## 2024-05-30 DIAGNOSIS — E1169 Type 2 diabetes mellitus with other specified complication: Secondary | ICD-10-CM | POA: Diagnosis not present

## 2024-05-30 DIAGNOSIS — N1831 Chronic kidney disease, stage 3a: Secondary | ICD-10-CM

## 2024-05-30 MED ORDER — PREDNISONE 10 MG PO TABS: ORAL_TABLET | ORAL | 0 refills | Status: DC

## 2024-05-30 NOTE — Patient Instructions (Addendum)
      Blood work was ordered.       Medications changes include :   prednisone  taper       Return for follow up.

## 2024-05-31 LAB — CBC WITH DIFFERENTIAL/PLATELET
Absolute Lymphocytes: 927 {cells}/uL (ref 850–3900)
Absolute Monocytes: 497 {cells}/uL (ref 200–950)
Basophils Absolute: 11 {cells}/uL (ref 0–200)
Basophils Relative: 0.1 %
Eosinophils Absolute: 0 {cells}/uL — ABNORMAL LOW (ref 15–500)
Eosinophils Relative: 0 %
HCT: 34.3 % — ABNORMAL LOW (ref 35.0–45.0)
Hemoglobin: 11.3 g/dL — ABNORMAL LOW (ref 11.7–15.5)
MCH: 29.7 pg (ref 27.0–33.0)
MCHC: 32.9 g/dL (ref 32.0–36.0)
MCV: 90.3 fL (ref 80.0–100.0)
MPV: 10.1 fL (ref 7.5–12.5)
Monocytes Relative: 4.4 %
Neutro Abs: 9865 {cells}/uL — ABNORMAL HIGH (ref 1500–7800)
Neutrophils Relative %: 87.3 %
Platelets: 287 Thousand/uL (ref 140–400)
RBC: 3.8 Million/uL (ref 3.80–5.10)
RDW: 12.1 % (ref 11.0–15.0)
Total Lymphocyte: 8.2 %
WBC: 11.3 Thousand/uL — ABNORMAL HIGH (ref 3.8–10.8)

## 2024-05-31 LAB — IRON,TIBC AND FERRITIN PANEL
%SAT: 33 % (ref 16–45)
Ferritin: 85 ng/mL (ref 16–288)
Iron: 96 ug/dL (ref 45–160)
TIBC: 293 ug/dL (ref 250–450)

## 2024-05-31 LAB — VITAMIN B12: Vitamin B-12: 1784 pg/mL — ABNORMAL HIGH (ref 200–1100)

## 2024-05-31 LAB — MICROALBUMIN / CREATININE URINE RATIO
Creatinine, Urine: 84 mg/dL (ref 20–275)
Microalb Creat Ratio: 27 mg/g{creat} (ref <30)
Microalb, Ur: 2.3 mg/dL

## 2024-05-31 LAB — BASIC METABOLIC PANEL WITH GFR
BUN/Creatinine Ratio: 21 (calc) (ref 6–22)
BUN: 23 mg/dL (ref 7–25)
CO2: 25 mmol/L (ref 20–32)
Calcium: 9.7 mg/dL (ref 8.6–10.4)
Chloride: 104 mmol/L (ref 98–110)
Creat: 1.1 mg/dL — ABNORMAL HIGH (ref 0.60–0.95)
Glucose, Bld: 264 mg/dL — ABNORMAL HIGH (ref 65–99)
Potassium: 4.5 mmol/L (ref 3.5–5.3)
Sodium: 140 mmol/L (ref 135–146)
eGFR: 51 mL/min/1.73m2 — ABNORMAL LOW (ref >=60)

## 2024-05-31 LAB — HEMOGLOBIN A1C
Hgb A1c MFr Bld: 6.9 % — ABNORMAL HIGH (ref <5.7)
Mean Plasma Glucose: 151 mg/dL
eAG (mmol/L): 8.4 mmol/L

## 2024-06-01 ENCOUNTER — Ambulatory Visit: Payer: Self-pay | Admitting: Internal Medicine

## 2024-06-01 DIAGNOSIS — D649 Anemia, unspecified: Secondary | ICD-10-CM | POA: Insufficient documentation

## 2024-06-01 NOTE — Assessment & Plan Note (Signed)
 Chronic Lab Results  Component Value Date   HGBA1C 6.9 (H) 05/30/2024   Sugars have been controlled with lifestyle Check A1c

## 2024-06-11 NOTE — Progress Notes (Signed)
 06/12/2024 [name] [id number] March 08, 1946  Referring provider: Geofm Glade [name], MD Primary GI doctor: Dr. 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 Likely secondary to ABX and pelvic floor dysfunction Increase stress with husband recently diagnosed with hematology cancer, getting treatment 10/08/2023 colonoscopy with polyps and diverticulosis Given Metamucil, squatty potty, dicyclomine  as needed, has not started fiber - start on fiber, continue dicyclomine  as needed - has appointment with gynecology at [facility] 08/11/2024, keep this office visit, consider pelvic floor PT  Night sweats and occ during the day No weight loss 11/2023 CT chest with ILP 10/2023 CT AB pelvis with diverticulitis, no symptoms at this time Due for [facility] Has had elevated sugars with prednisone  use ? From sugars, IPF, monitor for any further symptoms Follow up Dr. Geofm  Diverticulitis seen on CT 11/01/2023, treated with Augmentin  Up to date on Colonoscopy 10/08/2023 with polyps, tics recall 09/2026 Responded well to augmentin  Add on fiber for diverticulosis Call if any worsening symptoms  GERD with esophagitis, epigastric pain and focal intestinal metaplasia CT cardiac with distal esophageal thickening and EG juctions EGD 10/08/2023 mild chronic inactive gastritis and focal intestinal metaplasia, negative H. Pylori Some concern from pulmonary for microaspiration, abnormal CT chest. -alginate therapy given but never tried, will take - Take Nexium  40 mg in the morning before meals - No specific follow-up EGD unless worsening symptoms  Anemia 05/30/2024  HGB 11.3 MCV 90.3 Platelets 287 05/30/2024 Iron 96 Ferritin 85 B12 1,784 Recent Labs    08/15/23 1553 10/31/23 1132 02/01/24 1927 04/17/24 1440 05/30/24 1631  HGB 12.1 11.6* 11.7* 11.7* 11.3*  Likely anemia of chronic disease potentially related to IPF  Hepatic steatosis seen on cardiac CT     Latest Ref Rng & Units 04/17/2024    2:40 PM 02/01/2024    7:27 PM 10/31/2023   11:32 AM  Hepatic Function  Total Protein 6.0 - 8.3 g/dL 7.3  7.5  7.6   Albumin 3.5 - 5.2 g/dL 4.3  4.5  4.3   AST 0 - 37 U/L 32  37  24   ALT 0 - 35 U/L 31  42  29   Alk Phosphatase 39 - 117 U/L 88  113  94   Total Bilirubin 0.2 - 1.2 mg/dL 0.3  0.3  0.4   Bilirubin, Direct 0.0 - 0.2 mg/dL  0.1     Platelets 755.9  INR 01/18/2022 0.9  Normal CBC, LFTs Recheck CBC, LFTs every 6 months, weight loss discussed Consider elastography in 2-3 year  Chest pain Cardiac CT 09/2023 stable mild moderate CAD ER precautions discussed  Abnormal chest CT early changes consistent with IPF Last seen 05/12/2024 by Dr. Meade  Normal pulmonary function test  Personal history of TA polyps Recall 09/2026, will discuss when we get close  Patient Care Team: Geofm Glade [name], MD as PCP - General (Internal Medicine) Hobart Powell BRAVO, MD (Inactive) as PCP - Cardiology (Cardiology) Pyrtle, Gordy HERO, MD as Consulting Physician (Gastroenterology) Claudene Arthea HERO, DO as Consulting Physician (Family Medicine) Charmayne Molly, MD as Consulting Physician (Ophthalmology)  HISTORY OF PRESENT ILLNESS:  Discussed the use of AI scribe software for clinical note transcription with the patient, who gave verbal consent to proceed.  I last saw the patient in the office 12/13/2023 for diverticulitis and GERD. She was a seen 04/30/2024 by Dr. Albertus for follow-up complaining of recurrent loose stools.  History of Present Illness   [name]  [name] is an 78 year old female who presents with persistent throat discomfort and dizziness.  She experiences persistent throat discomfort, particularly when swallowing pills, despite being on Nexium  before meals. She has not tried alginate therapy previously. No food getting caught in her throat. Occasional nausea is present, but she denies frequent headaches, urinary symptoms, or significant weight  loss.  She experiences dizziness, especially when transitioning from lying down to standing, and sometimes feels like she is moving or about to fall. This has been ongoing since at least August, when she had a brain MRI that did not reveal any significant issues. She was evaluated for inner ear problems, but the initial assessment did not indicate an inner ear cause. She has been prescribed meclizine  but cannot recall its effectiveness. She has swelling in her legs and wears compression socks, but has not noticed a difference in her dizziness with standing when wearing them.  She reports night sweats that occasionally occur during the day, lasting about 10 to 15 minutes. She experiences flushing and sweating on her scalp, neck, and chest. She has a history of higher blood sugar levels, possibly related to methylprednisolone  use for osteoarthritis flare-ups.  She has a history of diverticulitis but has not had recent episodes. She experiences alternating bowel habits, fluctuating between constipation and loose stools. She has not started fiber supplements yet.  She has a history of osteoarthritis, particularly affecting her left ankle, which has been treated with prednisone  during flare-ups. She is concerned about the impact of her symptoms on her ability to care for her husband, who is undergoing treatment for breast cancer.      She  reports that she has never smoked. She has been exposed to tobacco smoke. She has never used smokeless tobacco. She reports current alcohol use. She reports that she does not use drugs.  RELEVANT GI HISTORY, IMAGING AND LABS: Results   RADIOLOGY Brain MRI: Chronic small vessel disease, no acute findings (02/2024)  DIAGNOSTIC REPORTS Pulmonary function tests: Normal     EGD 10/08/2023- LA Grade A reflux esophagitis with no bleeding. - No endoscopic esophageal abnormality to explain patient' s dysphagia. Esophagus dilated with 52 Fr Maloney. - Gastritis. Biopsied. -  Duodenitis. - Normal second portion of the duodenum.  Colonoscopy - Three 3 to 5 mm polyps in the transverse colon, removed with a cold snare. Resected and retrieved. - One 4 mm polyp in the sigmoid colon, removed with a cold snare. Complete resection. Polyp tissue not retrieved. - Moderate diverticulosis in the sigmoid colon and in the distal descending colon.  Echo EF 60-65%moderate AR, no AS  CBC    Component Value Date/Time   WBC 11.3 (H) 05/30/2024 1631   RBC 3.80 05/30/2024 1631   HGB 11.3 (L) 05/30/2024 1631   HGB 12.1 08/15/2023 1553   HCT 34.3 (L) 05/30/2024 1631   HCT 37.3 08/15/2023 1553   PLT 287 05/30/2024 1631   PLT 252 08/15/2023 1553   MCV 90.3 05/30/2024 1631   MCV 91 08/15/2023 1553   MCH 29.7 05/30/2024 1631   MCHC 32.9 05/30/2024 1631   RDW 12.1 05/30/2024 1631   RDW 12.4 08/15/2023 1553   LYMPHSABS 1.6 02/01/2024 1927   LYMPHSABS WILL FOLLOW 02/10/2022 1131   MONOABS 0.5 02/01/2024 1927   EOSABS 0 (L) 05/30/2024 1631   EOSABS WILL FOLLOW 02/10/2022 1131   BASOSABS 11 05/30/2024 1631   BASOSABS WILL FOLLOW 02/10/2022 1131   Recent Labs    08/15/23 1553 10/31/23  1132 02/01/24 1927 04/17/24 1440 05/30/24 1631  HGB 12.1 11.6* 11.7* 11.7* 11.3*    CMP     Component Value Date/Time   NA 140 05/30/2024 1631   NA 141 08/15/2023 1553   K 4.5 05/30/2024 1631   CL 104 05/30/2024 1631   CO2 25 05/30/2024 1631   GLUCOSE 264 (H) 05/30/2024 1631   GLUCOSE 99 07/23/2006 1101   BUN 23 05/30/2024 1631   BUN 22 08/15/2023 1553   CREATININE 1.10 (H) 05/30/2024 1631   CALCIUM  9.7 05/30/2024 1631   PROT 7.3 04/17/2024 1440   PROT 7.2 04/17/2022 0852   ALBUMIN 4.3 04/17/2024 1440   ALBUMIN 4.4 04/17/2022 0852   AST 32 04/17/2024 1440   ALT 31 04/17/2024 1440   ALKPHOS 88 04/17/2024 1440   BILITOT 0.3 04/17/2024 1440   BILITOT 0.3 04/17/2022 0852   GFRNONAA 49 (L) 02/01/2024 1927   GFRNONAA 50 (L) 03/20/2018 0900   GFRAA 66 08/02/2020 0947   GFRAA 58  (L) 03/20/2018 0900      Latest Ref Rng & Units 04/17/2024    2:40 PM 02/01/2024    7:27 PM 10/31/2023   11:32 AM  Hepatic Function  Total Protein 6.0 - 8.3 g/dL 7.3  7.5  7.6   Albumin 3.5 - 5.2 g/dL 4.3  4.5  4.3   AST 0 - 37 U/L 32  37  24   ALT 0 - 35 U/L 31  42  29   Alk Phosphatase 39 - 117 U/L 88  113  94   Total Bilirubin 0.2 - 1.2 mg/dL 0.3  0.3  0.4   Bilirubin, Direct 0.0 - 0.2 mg/dL  0.1        Current Medications:   Current Outpatient Medications (Endocrine & Metabolic):    predniSONE  (DELTASONE ) 10 MG tablet, Take 4 tabs po qd x 3 days, then 3 tabs po qd x 3 days, then 2 tabs po qd x 3 days, then 1 tab po qd x 3 days  Current Outpatient Medications (Cardiovascular):    amLODipine  (NORVASC ) 5 MG tablet, TAKE 1 TABLET (5 MG TOTAL) BY MOUTH DAILY.   carvedilol  (COREG ) 3.125 MG tablet, TAKE 1 TABLET BY MOUTH TWICE A DAY   EPINEPHrine  (EPIPEN  2-PAK) 0.3 mg/0.3 mL IJ SOAJ injection, Use as directed for severe allergic reactions   rosuvastatin  (CRESTOR ) 5 MG tablet, Take 1 tablet (5 mg total) by mouth daily.   Current Outpatient Medications (Analgesics):    acetaminophen  (TYLENOL ) 500 MG tablet, Take 2 tablets (1,000 mg total) by mouth 3 (three) times daily as needed.   aspirin  81 MG tablet, Take 81 mg by mouth daily.  Current Outpatient Medications (Hematological):    vitamin B-12 (CYANOCOBALAMIN ) 1000 MCG tablet, Take 1,000 mcg by mouth 2 (two) times daily.   Current Outpatient Medications (Other):    ascorbic acid (VITAMIN C ) 1000 MG tablet, Take by oral route.   Cholecalciferol (VITAMIN D -3) 25 MCG (1000 UT) CAPS, Take 10,000 Units by mouth daily.    cyclobenzaprine  (FLEXERIL ) 10 MG tablet, Take 1 tablet (10 mg total) by mouth 2 (two) times daily as needed for muscle spasms.   diclofenac  Sodium (VOLTAREN ) 1 % GEL, Apply topically 4 (four) times daily as needed.    dicyclomine  (BENTYL ) 10 MG capsule, TAKE 1 TO 2 TABLETS THREE TIMES DAILY AS NEEDED   dicyclomine   (BENTYL ) 20 MG tablet, Take 0.5 tablets (10 mg total) by mouth every 8 (eight) hours as needed for spasms (AB  pain).   esomeprazole  (NEXIUM ) 40 MG capsule, Take 1 capsule (40 mg total) by mouth daily. Take 30 minutes prior to a meal   MAGNESIUM  GLYCINATE PO, Take 240 mg by mouth daily.   meclizine  (ANTIVERT ) 12.5 MG tablet, Take 1 tablet (12.5 mg total) by mouth 3 (three) times daily as needed for dizziness.   Omega-3 Fatty Acids (FISH OIL BURP-LESS) 1000 MG CAPS, Takes 1280 mg 2 caps daiily   ondansetron  (ZOFRAN -ODT) 4 MG disintegrating tablet, Take 1 tablet (4 mg total) by mouth every 8 (eight) hours as needed for nausea or vomiting.   Ubiquinol 100 MG CAPS, Take 1 tablet by mouth daily.   vitamin E 400 UNIT capsule, Take 400 Units by mouth daily.   zinc  gluconate 50 MG tablet, Take 50 mg by mouth daily.  Medical History:  Past Medical History:  Diagnosis Date   Allergy     seasonal   Ankylosing spondylitis (HCC)    Anxiety    Arthritis    Bell palsy    yrs ago   Blood transfusion without reported diagnosis    CAD (coronary artery disease)    a. Coronary CT 10/2016 showed calcium  score in the 59th perecntile, moderate CAD in RCA/PDA and narrowing of the mid to distal LAD, with negative FFR.   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    GERD (gastroesophageal reflux disease)    history of CTS (carpal tunnel syndrome)    History of kidney stones    HLA B27 (HLA B27 positive)    Hx of colonic polyps    Hyperlipidemia    NMR 2005   Hypertension    Hypothyroidism    Incontinence    Internal hemorrhoids    Interstitial cystitis    Kidney stone    Menopause    Mild aortic insufficiency    Mild ascending aorta dilatation    followed [facility]  [facility]   Osteoarthritis    shoulder, ankles, hands   Osteopenia     [facility] [address]   Osteopenia    Pneumonia    2 x as child   Pre-diabetes    PSVT (paroxysmal supraventricular tachycardia)    S/P total hysterectomy and bilateral salpingo-oophorectomy 04/12/2014   Sinusitis    Sleep apnea    c-pap   Subjective visual disturbance of both eyes 12/30/2013   Tubular adenoma of colon    Vitamin D  deficiency    Xerostomia    and Xeroophthalmia(oral dryness)   Allergies:  Allergies  Allergen Reactions   Bactrim [Sulfamethoxazole-Trimethoprim] Other (See Comments)   Pentosan Polysulfate Other (See Comments)    Elevated liver count Elevated liver enzymes Elevated LFTs.......SABRA elmiron  Other reaction(s): Not available elmiron   Pentosan Polysulfate Sodium Other (See Comments)    Elevated LFTs.......SABRA elmiron    Promethazine Hcl Other (See Comments)    jittery on the inside   Sulfamethoxazole-Trimethoprim Nausea Only    Unknown reaction per pt, Pt thinks she may have felt sick to her stomach.    Lisinopril Cough   Losartan  Other (See Comments)    Pt states caused insomnia, tingling sensation in legs, lightheaded, stomach pain   Pork (Diagnostic) Nausea Only   Promethazine Hcl Anxiety     Surgical History:  She  has a past surgical history that includes Total abdominal hysterectomy w/ bilateral salpingoophorectomy; Bladder surgery; G 1 P 1; colonscopy; interstitial cystitis; Colonoscopy w/ polypectomy; Finger surgery (Left, 2004); Cystoscopy with retrograde pyelogram, ureteroscopy and stent placement (Left, 09/01/2013); Carpal tunnel release (Left, 12/22/2014); Shoulder arthroscopy (Left); Carpal tunnel release (Right, 02/18/2019); cataract excision (Bilateral, 2022); Excision of keloid (N/A, 03/09/2023); Colonoscopy (2019); and Colonoscopy with esophagogastroduodenoscopy (egd) (10/08/2023). Family History:  Her family history includes Allergic rhinitis in her sister and sister; Asthma in her mother; Breast cancer in her maternal  aunt; Cirrhosis in her father; Colon cancer (age of onset: 65) in her sister; Coronary artery disease in her maternal grandfather and mother; Dementia in her mother; Diabetes in her father, paternal aunt, and paternal grandmother; Environmental Allergies in her sister and sister; Food Allergy  in her sister; Hypertension in her mother; Renal cancer in her paternal aunt; Stroke in her mother.  REVIEW OF SYSTEMS  : All other systems reviewed and negative except where noted in the History of Present Illness.  PHYSICAL EXAM: BP 110/64   Pulse (!) 52   Ht 5' 2.5 (1.588 m)   Wt 162 lb (73.5 kg)   BMI 29.16 kg/m  Physical Exam   GENERAL APPEARANCE: Well nourished, in no apparent distress. HEENT: No cervical lymphadenopathy, unremarkable thyroid , sclerae anicteric, conjunctiva pink. RESPIRATORY: Respiratory effort normal, breath sounds equal bilaterally without rales, rhonchi, or wheezing. CARDIO: Regular rate and rhythm with no murmurs, rubs, or gallops, peripheral pulses intact. ABDOMEN: Soft, non-distended, active bowel sounds in all four quadrants, non-tender, no rebound, no mass appreciated. RECTAL: Declines. MUSCULOSKELETAL: Full range of motion, normal gait, without edema. SKIN: Dry, intact without rashes or lesions. No jaundice. NEURO: Alert, oriented, no focal deficits. PSYCH: Cooperative, normal mood and affect.      Alan JONELLE Coombs, PA-C 10:00 AM

## 2024-06-12 ENCOUNTER — Ambulatory Visit: Admitting: Physician Assistant

## 2024-06-12 ENCOUNTER — Encounter: Payer: Self-pay | Admitting: Physician Assistant

## 2024-06-12 VITALS — BP 110/64 | HR 52 | Ht 62.5 in | Wt 162.0 lb

## 2024-06-12 DIAGNOSIS — D649 Anemia, unspecified: Secondary | ICD-10-CM

## 2024-06-12 DIAGNOSIS — R197 Diarrhea, unspecified: Secondary | ICD-10-CM | POA: Diagnosis not present

## 2024-06-12 DIAGNOSIS — K582 Mixed irritable bowel syndrome: Secondary | ICD-10-CM

## 2024-06-12 DIAGNOSIS — K5730 Diverticulosis of large intestine without perforation or abscess without bleeding: Secondary | ICD-10-CM

## 2024-06-12 DIAGNOSIS — Z860100 Personal history of colon polyps, unspecified: Secondary | ICD-10-CM | POA: Diagnosis not present

## 2024-06-12 DIAGNOSIS — R1013 Epigastric pain: Secondary | ICD-10-CM

## 2024-06-12 NOTE — Patient Instructions (Addendum)
 Reflux Gourmet Rescue  It is an ALGINATE THERAPY which is the only intervention that works to safeguard the esophagus by creating a protective barrier that actually stops reflux from happening. -The general directions for use are as stated on the packaging: Take 1 teaspoon (5 ml), or more as needed or as directed by your physician, after meals and before bed. -These general directions address the most common times for reflux to occur, but our Rescue products may be taken anytime. Some individuals may take our product preemptively, when they know they will suffer from reflux, or as needed - when discomfort arises. (If taken around food, it should be consumed last.) -You do not have to take 1 teaspoon (5 ml) of the product. While one teaspoon (5ml) may be the perfect average amount to relieve reflux suffering in some, others may require more or less. You may adjust the amount of Mint Chocolate Rescue and Vanilla Caramel Rescue to the lowest amount necessary to meet your individual needs to improve your quality of life. -You may dilute the product if it is too viscous for you to consume. Keep in mind, however, that the thickness of the product was formulated to provide optimal coating and protection of your throat and esophagus. Though diluting the product is possible, it may reduce the protective function and/or length of action. -This can be used in conjunction with reflux medications and lifestyle changes.  100% ALL-NATURAL  Paraben FREE, glycerin FREE, & potassium FREE  Made entirely from all-natural ingredients considered safe for children and during pregnancy  No known side effects  All-natural flavor Gluten FREE  Allergen FREE  Vegan  Can find more information here: nameseizer.co.nz  Toileting tips to help with your constipation - Drink at least 64-80 ounces of water/liquid per day. - Establish a time to try to move your bowels every day.  For many  people, this is after a cup of coffee or after a meal such as breakfast. - Sit all of the way back on the toilet keeping your back fairly straight and while sitting up, try to rest the tops of your forearms on your upper thighs.   - Raising your feet with a step stool/squatty potty can be helpful to improve the angle that allows your stool to pass through the rectum. - Relax the rectum feeling it bulge toward the toilet water.  If you feel your rectum raising toward your body, you are contracting rather than relaxing. - Breathe in and slowly exhale. Belly breath by expanding your belly towards your belly button. Keep belly expanded as you gently direct pressure down and back to the anus.  A low pitched GRRR sound can assist with increasing intra-abdominal pressure.  (Can also trying to blow on a pinwheel and make it move, this helps with the same belly breathing) - Repeat 3-4 times. If unsuccessful, contract the pelvic floor to restore normal tone and get off the toilet.  Avoid excessive straining. - To reduce excessive wiping by teaching your anus to normally contract, place hands on outer aspect of knees and resist knee movement outward.  Hold 5-10 second then place hands just inside of knees and resist inward movement of knees.  Hold 5 seconds.  Repeat a few times each way.  Go to the ER if unable to pass gas, severe AB pain, unable to hold down food, any shortness of breath of chest pain.  FIBER SUPPLEMENT You can do metamucil or fibercon once or twice a day but if  this causes gas/bloating please switch to Benefiber or Citracel.  Fiber is good for constipation/diarrhea/irritable bowel syndrome.  It can also help with weight loss and can help lower your bad cholesterol (LDL).  Please do 1 TBSP in the morning in water, coffee, or tea.  It can take up to a month before you can see a difference with your bowel movements.  It is cheapest from [employer], [employer], [employer].   Here some information about  pelvic floor dysfunction. This may be contributing to some of your symptoms. We will continue with our evaluation but I do want you to consider adding on fiber supplement with low-dose MiraLAX daily. We could also refer to pelvic floor physical therapy.   Pelvic Floor Dysfunction, Female Pelvic floor dysfunction (PFD) is a condition that results when the group of muscles and connective tissues that support the organs in the pelvis (pelvic floor muscles) do not work well. These muscles and their connections form a sling that supports the colon and bladder. In women, they also support the uterus. PFD causes pelvic floor muscles to be too weak, too tight, or both. In PFD, muscle movements are not coordinated. This may cause bowel or bladder problems. It may also cause pain. What are the causes? This condition may be caused by an injury to the pelvic area or by a weakening of pelvic muscles. This often results from pregnancy and childbirth or other types of strain. In many cases, the exact cause is not known. What increases the risk? The following factors may make you more likely to develop this condition: Having chronic bladder tissue inflammation (interstitial cystitis). Being an older person. Being overweight. History of radiation treatment for cancer in the pelvic region. Previous pelvic surgery, such as removal of the uterus (hysterectomy). What are the signs or symptoms? Symptoms of this condition vary and may include: Bladder symptoms, such as: Trouble starting urination and emptying the bladder. Frequent urinary tract infections. Leaking urine when coughing, laughing, or exercising (stress incontinence). Having to pass urine urgently or frequently. Pain when passing urine. Bowel symptoms, such as: Constipation. Urgent or frequent bowel movements. Incomplete bowel movements. Painful bowel movements. Leaking stool or gas. Unexplained genital or rectal pain. Genital or rectal muscle  spasms. Low back pain. Other symptoms may include: A heavy, full, or aching feeling in the vagina. A bulge that protrudes into the vagina. Pain during or after sex. How is this diagnosed? This condition may be diagnosed based on: Your symptoms and medical history. A physical exam. During the exam, your health care provider may check your pelvic muscles for tightness, spasm, pain, or weakness. This may include a rectal exam and a pelvic exam. In some cases, you may have diagnostic tests, such as: Electrical muscle function tests. Urine flow testing. X-ray tests of bowel function. Ultrasound of the pelvic organs. How is this treated? Treatment for this condition depends on the symptoms. Treatment options include: Physical therapy. This may include Kegel exercises to help relax or strengthen the pelvic floor muscles. Biofeedback. This type of therapy provides feedback on how tight your pelvic floor muscles are so that you can learn to control them. Internal or external massage therapy. A treatment that involves electrical stimulation of the pelvic floor muscles to help control pain (transcutaneous electrical nerve stimulation, or TENS). Sound wave therapy (ultrasound) to reduce muscle spasms. Medicines, such as: Muscle relaxants. Bladder control medicines. Surgery to reconstruct or support pelvic floor muscles may be an option if other treatments do not help.  Follow these instructions at home: Activity Do your usual activities as told by your health care provider. Ask your health care provider if you should modify any activities. Do pelvic floor strengthening or relaxing exercises at home as told by your physical therapist. Lifestyle Maintain a healthy weight. Eat foods that are high in fiber, such as beans, whole grains, and fresh fruits and vegetables. Limit foods that are high in fat and processed sugars, such as fried or sweet foods. Manage stress with relaxation techniques such as  yoga or meditation. General instructions If you have problems with leakage: Use absorbable pads or wear padded underwear. Wash frequently with mild soap. Keep your genital and anal area as clean and dry as possible. Ask your health care provider if you should try a barrier cream to prevent skin irritation. Take warm baths to relieve pelvic muscle tension or spasms. Take over-the-counter and prescription medicines only as told by your health care provider. Keep all follow-up visits. How is this prevented? The cause of PFD is not always known, but there are a few things you can do to reduce the risk of developing this condition, including: Staying at a healthy weight. Getting regular exercise. Managing stress. Contact a health care provider if: Your symptoms are not improving with home care. You have signs or symptoms of PFD that get worse at home. You develop new signs or symptoms. You have signs of a urinary tract infection, such as: Fever. Chills. Increased urinary frequency. A burning feeling when urinating. You have not had a bowel movement in 3 days (constipation). Summary Pelvic floor dysfunction results when the muscles and connective tissues in your pelvic floor do not work well. These muscles and their connections form a sling that supports your colon and bladder. In women, they also support the uterus. PFD may be caused by an injury to the pelvic area or by a weakening of pelvic muscles. PFD causes pelvic floor muscles to be too weak, too tight, or a combination of both. Symptoms may vary from person to person. In most cases, PFD can be treated with physical therapies and medicines. Surgery may be an option if other treatments do not help. This information is not intended to replace advice given to you by your health care provider. Make sure you discuss any questions you have with your health care provider. Document Revised: 11/24/2020 Document Reviewed: 11/24/2020 Elsevier  Patient Education  2022 [employer]  Diverticulosis Diverticulosis is a condition that develops when small pouches (diverticula) form in the wall of the large intestine (colon). The colon is where water is absorbed and stool (feces) is formed. The pouches form when the inside layer of the colon pushes through weak spots in the outer layers of the colon. You may have a few pouches or many of them. The pouches usually do not cause problems unless they become inflamed or infected. When this happens, the condition is called diverticulitis- this is left lower quadrant pain, diarrhea, fever, chills, nausea or vomiting.  If this occurs please call the office or go to the hospital. Sometimes these patches without inflammation can also have painless bleeding associated with them, if this happens please call the office or go to the hospital. Preventing constipation and increasing fiber can help reduce diverticula and prevent complications. Even if you feel you have a high-fiber diet, suggest getting on Benefiber or Cirtracel 2 times daily.

## 2024-06-13 ENCOUNTER — Other Ambulatory Visit: Payer: Self-pay | Admitting: Internal Medicine

## 2024-06-16 ENCOUNTER — Ambulatory Visit: Admitting: Physician Assistant

## 2024-06-18 DIAGNOSIS — M19072 Primary osteoarthritis, left ankle and foot: Secondary | ICD-10-CM | POA: Diagnosis not present

## 2024-06-18 DIAGNOSIS — M1612 Unilateral primary osteoarthritis, left hip: Secondary | ICD-10-CM | POA: Diagnosis not present

## 2024-06-22 ENCOUNTER — Other Ambulatory Visit: Payer: Self-pay | Admitting: Internal Medicine

## 2024-06-28 DIAGNOSIS — M545 Low back pain, unspecified: Secondary | ICD-10-CM | POA: Diagnosis not present

## 2024-07-16 ENCOUNTER — Encounter: Payer: Self-pay | Admitting: Internal Medicine

## 2024-07-16 ENCOUNTER — Ambulatory Visit: Admitting: Internal Medicine

## 2024-07-16 DIAGNOSIS — D649 Anemia, unspecified: Secondary | ICD-10-CM

## 2024-07-16 LAB — CBC WITH DIFFERENTIAL/PLATELET
Basophils Absolute: 0 K/uL (ref 0.0–0.1)
Basophils Relative: 0.8 % (ref 0.0–3.0)
Eosinophils Absolute: 0.2 K/uL (ref 0.0–0.7)
Eosinophils Relative: 2.6 % (ref 0.0–5.0)
HCT: 32.8 % — ABNORMAL LOW (ref 36.0–46.0)
Hemoglobin: 11.4 g/dL — ABNORMAL LOW (ref 12.0–15.0)
Lymphocytes Relative: 18.1 % (ref 12.0–46.0)
Lymphs Abs: 1.1 K/uL (ref 0.7–4.0)
MCHC: 34.7 g/dL (ref 30.0–36.0)
MCV: 86.5 fl (ref 78.0–100.0)
Monocytes Absolute: 0.7 K/uL (ref 0.1–1.0)
Monocytes Relative: 10.8 % (ref 3.0–12.0)
Neutro Abs: 4.2 K/uL (ref 1.4–7.7)
Neutrophils Relative %: 67.7 % (ref 43.0–77.0)
Platelets: 374 K/uL (ref 150.0–400.0)
RBC: 3.79 Mil/uL — ABNORMAL LOW (ref 3.87–5.11)
RDW: 13.7 % (ref 11.5–15.5)
WBC: 6.3 K/uL (ref 4.0–10.5)

## 2024-07-16 LAB — IBC PANEL
Iron: 46 ug/dL (ref 42–145)
Saturation Ratios: 12.6 % — ABNORMAL LOW (ref 20.0–50.0)
TIBC: 364 ug/dL (ref 250.0–450.0)
Transferrin: 260 mg/dL (ref 212.0–360.0)

## 2024-07-16 LAB — FERRITIN: Ferritin: 110.3 ng/mL (ref 10.0–291.0)

## 2024-07-16 MED ORDER — HYDROCODONE BIT-HOMATROP MBR 5-1.5 MG/5ML PO SOLN: 5.0000 mL | Freq: Three times a day (TID) | ORAL | 0 refills | Status: DC | PRN

## 2024-07-16 NOTE — Progress Notes (Signed)
 Subjective:    Patient ID: [name], female    DOB: 12-01-1945, 78 y.o.   MRN: [id number]      HPI Baljit is here for  Chief Complaint  Patient presents with   Acute Visit    Started two days ago.    Discussed the use of AI scribe software for clinical note transcription with the patient, who gave verbal consent to proceed.  History of Present Illness [name] is an 78 year old female who presents with symptoms of a cold.  Symptoms began two days ago with clear nasal drainage that occurs after moving around for a few minutes. The drainage is mostly clear but occasionally has a slight tinge. She experiences nasal and head congestion, particularly behind her eyes, and sneezes two to three times in a row. She feels generally unwell and has chills but no fever. She describes a headache that feels like pressure from the temples upwards. She also experiences lightheadedness and occasional nausea without diarrhea. No fever, wheezing, shortness of breath, or diarrhea.  She has a history of osteoarthritis affecting her shoulder, hip, and back, which causes significant pain. She reports that a doctor recommended she take meloxicam  daily instead of ibuprofen , and discussed concerns about her kidney function. She notes that extra strength Tylenol  is less effective for her pain compared to ibuprofen .  She is concerned about her hemoglobin and iron levels, which have been low in previous tests.        Medications and allergies reviewed with patient and updated if appropriate.  Medications Ordered Prior to Encounter[1]  Review of Systems  Constitutional:  Positive for chills and fatigue. Negative for fever.  HENT:  Positive for congestion, rhinorrhea (clear - occ tinge of yellow), sinus pressure (nasal and behind eyes) and sneezing. Negative for sore throat.   Respiratory:  Positive for cough (occ). Negative for shortness of breath and wheezing.   Gastrointestinal:  Positive for  nausea. Negative for diarrhea.  Musculoskeletal:  Positive for myalgias (increased more than usual).  Neurological:  Positive for light-headedness and headaches (congested headache).       Objective:   Vitals:   07/16/24 0922  BP: 118/76  Pulse: 75  Temp: 98.4 F (36.9 C)  SpO2: 98%   BP Readings from Last 3 Encounters:  07/16/24 118/76  06/12/24 110/64  05/30/24 110/80   Wt Readings from Last 3 Encounters:  07/16/24 154 lb (69.9 kg)  06/12/24 162 lb (73.5 kg)  05/30/24 162 lb (73.5 kg)   Body mass index is 28.17 kg/m.    Physical Exam Constitutional:      General: She is not in acute distress.    Appearance: Normal appearance. She is not ill-appearing.  HENT:     Head: Normocephalic and atraumatic.     Right Ear: Tympanic membrane, ear canal and external ear normal.     Left Ear: Tympanic membrane, ear canal and external ear normal.     Mouth/Throat:     Mouth: Mucous membranes are moist.     Pharynx: No oropharyngeal exudate or posterior oropharyngeal erythema.  Eyes:     Conjunctiva/sclera: Conjunctivae normal.  Cardiovascular:     Rate and Rhythm: Normal rate and regular rhythm.  Pulmonary:     Effort: Pulmonary effort is normal. No respiratory distress.     Breath sounds: Normal breath sounds. No wheezing or rales.  Musculoskeletal:     Cervical back: Neck supple. No tenderness.  Lymphadenopathy:  Cervical: No cervical adenopathy.  Skin:    General: Skin is warm and dry.  Neurological:     Mental Status: She is alert.            Assessment & Plan:    See Problem List for Assessment and Plan of chronic medical problems.    Assessment and Plan Assessment & Plan Acute upper respiratory infection Likely viral etiology. Contagious, requiring precautions around immunocompromised individuals. - covid and flu tests negative - Use precautions to prevent transmission, especially around immunocompromised individuals. - hycodan cough syrup  prescribed - Consider using cough medicine, otc cold medication for symptomatic relief.  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 Pain management is challenging due to chronic kidney disease, limiting NSAID use. Narcotics are not ideal due to side effects and driving restrictions. - sees pain management specialist this week - should see them for comprehensive pain management. - Monitor kidney function regularly if NSAIDs are used. - Consider alternative pain management strategies, including topical treatments and low-dose NSAIDs with monitoring.  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 Monitoring is essential to prevent further deterioration. - Monitor kidney function regularly. - Avoid regular use of NSAIDs to prevent further kidney damage.  Anemia Previous low hemoglobin and iron levels. Follow-up CBC is pending to assess current status. - Complete CBC to assess current hemoglobin and iron levels.  Hypertension Associated with diabetes. Blood pressure management is crucial to prevent further kidney damage. - Continue current antihypertensive regimen. - amlodipine  5mg  daily, coreg  3.125 mg bid        [1]  Current Outpatient Medications on File Prior to Visit  Medication Sig Dispense Refill   acetaminophen  (TYLENOL ) 500 MG tablet Take 2 tablets (1,000 mg total) by mouth 3 (three) times daily as needed.     amLODipine  (NORVASC ) 5 MG tablet TAKE 1 TABLET (5 MG TOTAL) BY MOUTH DAILY. 90 tablet 3   ascorbic acid (VITAMIN C ) 1000 MG tablet Take by oral route.     aspirin  81 MG tablet Take 81 mg by mouth daily.     carvedilol  (COREG ) 3.125 MG tablet TAKE 1 TABLET BY MOUTH TWICE A DAY 180 tablet 1   Cholecalciferol (VITAMIN D -3) 25 MCG (1000 UT) CAPS Take 10,000 Units by mouth daily.      cyclobenzaprine  (FLEXERIL ) 10 MG  tablet Take 1 tablet (10 mg total) by mouth 2 (two) times daily as needed for muscle spasms. 14 tablet 0   diclofenac  Sodium (VOLTAREN ) 1 % GEL Apply topically 4 (four) times daily as needed.      dicyclomine  (BENTYL ) 10 MG capsule TAKE 1 TO 2 TABLETS THREE TIMES DAILY AS NEEDED     dicyclomine  (BENTYL ) 20 MG tablet Take 0.5 tablets (10 mg total) by mouth every 8 (eight) hours as needed for spasms (AB pain). 30 tablet 0   EPINEPHrine  (EPIPEN  2-PAK) 0.3 mg/0.3 mL IJ SOAJ injection Use as directed for severe allergic reactions 2 each 1   esomeprazole  (NEXIUM ) 40 MG capsule TAKE 1 CAPSULE (40 MG TOTAL) BY MOUTH DAILY. TAKE 30 MINUTES PRIOR TO A MEAL 90 capsule 1   MAGNESIUM  GLYCINATE PO Take 240 mg by mouth daily.     meclizine  (ANTIVERT ) 12.5 MG tablet Take 1 tablet (12.5 mg total) by mouth 3 (three) times daily as needed for dizziness. 30 tablet 0   Omega-3 Fatty Acids (  FISH OIL BURP-LESS) 1000 MG CAPS Takes 1280 mg 2 caps daiily 60 capsule 0   ondansetron  (ZOFRAN -ODT) 4 MG disintegrating tablet Take 1 tablet (4 mg total) by mouth every 8 (eight) hours as needed for nausea or vomiting. 20 tablet 0   predniSONE  (DELTASONE ) 10 MG tablet Take 4 tabs po qd x 3 days, then 3 tabs po qd x 3 days, then 2 tabs po qd x 3 days, then 1 tab po qd x 3 days 30 tablet 0   rosuvastatin  (CRESTOR ) 5 MG tablet Take 1 tablet (5 mg total) by mouth daily. 90 tablet 3   Ubiquinol 100 MG CAPS Take 1 tablet by mouth daily.     vitamin B-12 (CYANOCOBALAMIN ) 1000 MCG tablet Take 1,000 mcg by mouth 2 (two) times daily.      vitamin E 400 UNIT capsule Take 400 Units by mouth daily.     zinc  gluconate 50 MG tablet Take 50 mg by mouth daily.     No current facility-administered medications on file prior to visit.

## 2024-07-16 NOTE — Patient Instructions (Addendum)
° ° °  Your covid and flu are negative     Medications changes include :   hycodan cough syrup sent to [facility]  Take mucinex.  You can take any coricidan cold products that match up with your symptoms.        Return if symptoms worsen or fail to improve.

## 2024-07-18 ENCOUNTER — Emergency Department (HOSPITAL_BASED_OUTPATIENT_CLINIC_OR_DEPARTMENT_OTHER)

## 2024-07-18 ENCOUNTER — Emergency Department (HOSPITAL_BASED_OUTPATIENT_CLINIC_OR_DEPARTMENT_OTHER)
Admission: EM | Admit: 2024-07-18 | Discharge: 2024-07-18 | Disposition: A | Discharge status: Home / Self Care | Attending: Emergency Medicine | Admitting: Emergency Medicine

## 2024-07-18 ENCOUNTER — Encounter (HOSPITAL_BASED_OUTPATIENT_CLINIC_OR_DEPARTMENT_OTHER): Payer: Self-pay | Admitting: Emergency Medicine

## 2024-07-18 ENCOUNTER — Other Ambulatory Visit: Payer: Self-pay

## 2024-07-18 DIAGNOSIS — M545 Low back pain, unspecified: Secondary | ICD-10-CM

## 2024-07-18 DIAGNOSIS — R109 Unspecified abdominal pain: Secondary | ICD-10-CM | POA: Diagnosis present

## 2024-07-18 DIAGNOSIS — N3090 Cystitis, unspecified without hematuria: Secondary | ICD-10-CM | POA: Diagnosis not present

## 2024-07-18 LAB — COMPREHENSIVE METABOLIC PANEL WITH GFR
ALT: 26 U/L (ref 0–44)
AST: 25 U/L (ref 15–41)
Albumin: 4.4 g/dL (ref 3.5–5.0)
Alkaline Phosphatase: 111 U/L (ref 38–126)
Anion gap: 13 (ref 5–15)
BUN: 14 mg/dL (ref 8–23)
CO2: 23 mmol/L (ref 22–32)
Calcium: 9.5 mg/dL (ref 8.9–10.3)
Chloride: 101 mmol/L (ref 98–111)
Creatinine, Ser: 1.14 mg/dL — ABNORMAL HIGH (ref 0.44–1.00)
GFR, Estimated: 48 mL/min — ABNORMAL LOW
Glucose, Bld: 143 mg/dL — ABNORMAL HIGH (ref 70–99)
Potassium: 4 mmol/L (ref 3.5–5.1)
Sodium: 138 mmol/L (ref 135–145)
Total Bilirubin: 0.4 mg/dL (ref 0.0–1.2)
Total Protein: 7.5 g/dL (ref 6.5–8.1)

## 2024-07-18 LAB — URINALYSIS, ROUTINE W REFLEX MICROSCOPIC
Bilirubin Urine: NEGATIVE
Glucose, UA: NEGATIVE mg/dL
Hgb urine dipstick: NEGATIVE
Ketones, ur: NEGATIVE mg/dL
Nitrite: NEGATIVE
Protein, ur: NEGATIVE mg/dL
Specific Gravity, Urine: 1.02 (ref 1.005–1.030)
pH: 7 (ref 5.0–8.0)

## 2024-07-18 LAB — CBC
HCT: 33.7 % — ABNORMAL LOW (ref 36.0–46.0)
Hemoglobin: 10.9 g/dL — ABNORMAL LOW (ref 12.0–15.0)
MCH: 28.8 pg (ref 26.0–34.0)
MCHC: 32.3 g/dL (ref 30.0–36.0)
MCV: 88.9 fL (ref 80.0–100.0)
Platelets: 322 K/uL (ref 150–400)
RBC: 3.79 MIL/uL — ABNORMAL LOW (ref 3.87–5.11)
RDW: 12.8 % (ref 11.5–15.5)
WBC: 8.2 K/uL (ref 4.0–10.5)
nRBC: 0 % (ref 0.0–0.2)

## 2024-07-18 LAB — RESP PANEL BY RT-PCR (RSV, FLU A&B, COVID)  RVPGX2
Influenza A by PCR: NEGATIVE
Influenza B by PCR: NEGATIVE
Resp Syncytial Virus by PCR: NEGATIVE
SARS Coronavirus 2 by RT PCR: NEGATIVE

## 2024-07-18 LAB — LIPASE, BLOOD: Lipase: 62 U/L — ABNORMAL HIGH (ref 11–51)

## 2024-07-18 LAB — URINALYSIS, MICROSCOPIC (REFLEX): RBC / HPF: NONE SEEN RBC/hpf (ref 0–5)

## 2024-07-18 MED ORDER — HYDROCODONE-ACETAMINOPHEN 5-325 MG PO TABS
1.0000 | ORAL_TABLET | Freq: Once | ORAL | Status: AC
  Administered 2024-07-18: 1 via ORAL
  Filled 2024-07-18: qty 1

## 2024-07-18 MED ORDER — CEPHALEXIN 500 MG PO CAPS: 500.0000 mg | ORAL_CAPSULE | Freq: Two times a day (BID) | ORAL | 0 refills | Status: AC

## 2024-07-18 MED ORDER — IOHEXOL 300 MG/ML  SOLN
75.0000 mL | Freq: Once | INTRAMUSCULAR | Status: AC | PRN
  Administered 2024-07-18: 75 mL via INTRAVENOUS

## 2024-07-18 MED ORDER — MORPHINE SULFATE (PF) 2 MG/ML IV SOLN
2.0000 mg | Freq: Once | INTRAVENOUS | Status: AC
  Administered 2024-07-18: 2 mg via INTRAVENOUS
  Filled 2024-07-18: qty 1

## 2024-07-18 MED ORDER — HYDROCODONE-ACETAMINOPHEN 5-325 MG PO TABS: 1.0000 | ORAL_TABLET | ORAL | 0 refills | Status: DC | PRN

## 2024-07-18 MED ORDER — ONDANSETRON HCL 4 MG/2ML IJ SOLN
4.0000 mg | Freq: Once | INTRAMUSCULAR | Status: AC
  Administered 2024-07-18: 4 mg via INTRAVENOUS
  Filled 2024-07-18: qty 2

## 2024-07-18 MED ORDER — CEPHALEXIN 250 MG PO CAPS
500.0000 mg | ORAL_CAPSULE | Freq: Once | ORAL | Status: AC
  Administered 2024-07-18: 500 mg via ORAL
  Filled 2024-07-18: qty 2

## 2024-07-18 NOTE — ED Provider Notes (Signed)
 " [facility] Provider Note   CSN: [id number] Arrival date & time: 07/18/24  1713     Patient presents with: Abdominal Pain and Back Pain   [name] is a 78 y.o. female.   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  Patient reports that this would her pain.  It is in her lower abdomen.  Worse with movement.  Radiates to her back.  No radiation down her legs.  Has chronic bladder incontinence.  No bowel incontinence.  No numbness between her legs.  No history of cancer.  No history of back surgery.  Not on blood thinners.  Did have an MRI of the lumbar spine on 05/2023 that shows L4-L5 disc protrusion with moderate to severe canal stenosis with multiple areas of foraminal stenosis.  Nausea but no vomiting.  No fevers or chills.  No dysuria or frequency.  No changes to her bowel habits.       Prior to Admission medications  Medication Sig Start Date End Date Taking? Authorizing Provider  cephALEXin  (KEFLEX ) 500 MG capsule Take 1 capsule (500 mg total) by mouth 2 (two) times daily for 7 days. 07/18/24 07/25/24 Yes Yolande Lamar BROCKS, MD  HYDROcodone -acetaminophen  (NORCO/VICODIN) 5-325 MG tablet Take 1 tablet by mouth every 4 (four) hours as needed. 07/18/24  Yes Yolande Lamar BROCKS, MD  acetaminophen  (TYLENOL ) 500 MG tablet Take 2 tablets (1,000 mg total) by mouth 3 (three) times daily as needed. 03/07/23   Geofm Glade [name], MD  amLODipine  (NORVASC ) 5 MG tablet TAKE 1 TABLET (5 MG TOTAL) BY MOUTH DAILY. 12/19/23 12/13/24  Dunn, Dayna N, PA-C  ascorbic acid (VITAMIN C ) 1000 MG tablet Take by oral route.    [provider]  aspirin  81 MG tablet Take 81 mg by mouth daily.    [provider]  carvedilol  (COREG ) 3.125 MG tablet TAKE 1 TABLET BY MOUTH TWICE A DAY 03/18/24   Dunn, Dayna N, PA-C  Cholecalciferol  (VITAMIN D -3) 25 MCG (1000 UT) CAPS Take 10,000 Units by mouth daily.     [provider]  cyclobenzaprine  (FLEXERIL ) 10 MG tablet Take 1 tablet (10 mg total) by mouth 2 (two) times daily as needed for muscle spasms. 02/01/24   Odell Balls, PA-C  diclofenac  Sodium (VOLTAREN ) 1 % GEL Apply topically 4 (four) times daily as needed.     [provider]  dicyclomine  (BENTYL ) 10 MG capsule TAKE 1 TO 2 TABLETS THREE TIMES DAILY AS NEEDED    [provider]  dicyclomine  (BENTYL ) 20 MG tablet Take 0.5 tablets (10 mg total) by mouth every 8 (eight) hours as needed for spasms (AB pain). 04/17/24   Craig Alan SAUNDERS, PA-C  EPINEPHrine  (EPIPEN  2-PAK) 0.3 mg/0.3 mL IJ SOAJ injection Use as directed for severe allergic reactions 05/12/24   Meade Verdon RAMAN, MD  esomeprazole  (NEXIUM ) 40 MG capsule TAKE 1 CAPSULE (40 MG TOTAL) BY MOUTH DAILY. TAKE 30 MINUTES PRIOR TO A MEAL 06/23/24   Burns, Glade [name], MD  HYDROcodone  bit-homatropine [facility]) 5-1.5 MG/5ML syrup Take 5 mLs by mouth every 8 (eight) hours as needed for cough. 07/16/24   Geofm Glade [name], MD  MAGNESIUM  GLYCINATE PO Take 240 mg by mouth daily.    [provider]  meclizine  (ANTIVERT ) 12.5 MG tablet Take 1 tablet (12.5 mg total) by mouth 3 (three)  times daily as needed for dizziness. 03/24/24   Geofm Glade PARAS, MD  Omega-3 Fatty Acids (FISH OIL BURP-LESS) 1000 MG CAPS Takes 1280 mg 2 caps daiily 01/03/16   Esterwood, Amy S, PA-C  ondansetron  (ZOFRAN -ODT) 4 MG disintegrating tablet Take 1 tablet (4 mg total) by mouth every 8 (eight) hours as needed for nausea or vomiting. 02/01/24   Odell Balls, PA-C  predniSONE  (DELTASONE ) 10 MG tablet Take 4 tabs po qd x 3 days, then 3 tabs po qd x 3 days, then 2 tabs po qd x 3 days, then 1 tab po qd x 3 days 05/30/24   Geofm Glade PARAS, MD  rosuvastatin  (CRESTOR ) 5 MG tablet Take 1 tablet (5 mg total) by mouth daily. 01/21/24   Geofm Glade PARAS, MD  Ubiquinol 100 MG CAPS Take 1 tablet by mouth  daily.    [provider]  vitamin B-12 (CYANOCOBALAMIN ) 1000 MCG tablet Take 1,000 mcg by mouth 2 (two) times daily.     [provider]  vitamin E 400 UNIT capsule Take 400 Units by mouth daily.    [provider]  zinc  gluconate 50 MG tablet Take 50 mg by mouth daily.    [provider]    Allergies: Bactrim [sulfamethoxazole-trimethoprim], Pentosan polysulfate, Pentosan polysulfate sodium, Promethazine hcl, Sulfamethoxazole-trimethoprim, Lisinopril, Losartan , Pork (diagnostic), and Promethazine hcl    Review of Systems  Updated Vital Signs BP 117/61   Pulse 73   Temp 99.7 F (37.6 C) (Oral)   Resp 20   Wt 69.9 kg   SpO2 91%   BMI 28.17 kg/m   Physical Exam Vitals and nursing note reviewed.  Constitutional:      General: She is not in acute distress.    Appearance: She is well-developed.  HENT:     Head: Normocephalic and atraumatic.     Right Ear: External ear normal.     Left Ear: External ear normal.     Nose: Nose normal.  Eyes:     Extraocular Movements: Extraocular movements intact.     Conjunctiva/sclera: Conjunctivae normal.     Pupils: Pupils are equal, round, and reactive to light.  Abdominal:     General: Abdomen is flat. There is no distension.     Palpations: Abdomen is soft. There is no mass.     Tenderness: There is abdominal tenderness (suprapubic). There is no guarding.  Musculoskeletal:     Cervical back: Normal range of motion and neck supple.     Right lower leg: No edema.     Left lower leg: No edema.     Comments: No spinal midline TTP in thoracic spine.  Midline tenderness to palpation at approximately L5.  No stepoffs noted.   Motor: Muscle bulk and tone are normal. Strength is 5/5 in hip flexion, knee flexion and extension, ankle dorsiflexion and plantar flexion bilaterally. Full strength of great toe dorsiflexion bilaterally.  Sensory: Intact sensation to light touch in L2 though S1 dermatomes bilaterally.    Reflexes: Patellar 2+ bilaterally, Achilles 2+ bilaterally, no ankle clonus bilaterally.  Skin:    General: Skin is warm and dry.  Neurological:     Mental Status: She is alert and oriented to person, place, and time. Mental status is at baseline.  Psychiatric:        Mood and Affect: Mood normal.     (all labs ordered are listed, but only abnormal results are displayed) Labs Reviewed  LIPASE, BLOOD - Abnormal; Notable for the following  components:      Result Value   Lipase 62 (*)    All other components within normal limits  COMPREHENSIVE METABOLIC PANEL WITH GFR - Abnormal; Notable for the following components:   Glucose, Bld 143 (*)    Creatinine, Ser 1.14 (*)    GFR, Estimated 48 (*)    All other components within normal limits  CBC - Abnormal; Notable for the following components:   RBC 3.79 (*)    Hemoglobin 10.9 (*)    HCT 33.7 (*)    All other components within normal limits  URINALYSIS, ROUTINE W REFLEX MICROSCOPIC - Abnormal; Notable for the following components:   Leukocytes,Ua TRACE (*)    All other components within normal limits  URINALYSIS, MICROSCOPIC (REFLEX) - Abnormal; Notable for the following components:   Bacteria, UA RARE (*)    All other components within normal limits  RESP PANEL BY RT-PCR (RSV, FLU A&B, COVID)  [id number]  URINE CULTURE    EKG: EKG Interpretation Date/Time:  Friday July 18 2024 17:41:49 EST Ventricular Rate:  80 PR Interval:  144 QRS Duration:  86 QT Interval:  376 QTC Calculation: 434 R Axis:   -64  Text Interpretation: Sinus rhythm Left anterior fascicular block Low voltage, precordial leads Consider anterior infarct Nonspecific T abnormalities, lateral leads Confirmed by Yolande Charleston [phone]) on 07/18/2024 6:07:01 PM  Radiology: CT L-SPINE NO CHARGE Result Date: 07/18/2024 CLINICAL DATA:  Low abdominal cramping back pain EXAM: CT Lumbar Spine without contrast TECHNIQUE: Technique: Multiplanar CT images of the  lumbar spine were reconstructed from contemporary CT of the Abdomen and Pelvis. RADIATION DOSE REDUCTION: This exam was performed according to the departmental dose-optimization program which includes automated exposure control, adjustment of the mA and/or kV according to patient size and/or use of iterative reconstruction technique. CONTRAST:  None or No additional COMPARISON:  None FINDINGS: Segmentation: 5 lumbar type vertebrae. Alignment: Levoscoliosis.  Trace grade 1 anterolisthesis L4 on L5. Vertebrae: No acute fracture or focal pathologic process. Paraspinal and other soft tissues: No acute finding Disc levels: At T12-L1, disc space narrowing and vacuum disc. No high-grade canal stenosis. Patent foramen. At L1-L2, disc space narrowing. No high-grade canal stenosis. Mild facet degenerative changes. Patent foramen. At L2-L3, disc space narrowing and vacuum disc. Mild facet degenerative changes. Mild diffuse disc bulge. No high-grade canal stenosis. No high-grade foraminal narrowing. At L3-L4, diffuse disc bulge. Moderate hypertrophic facet degenerative changes and ligamentum flavum thickening. Suspected mild canal stenosis. At L4-L5, mild disc space narrowing. Diffuse disc bulge. Moderate severe facet degenerative changes. Suspicion of moderate canal stenosis. Mild left foraminal narrowing. At L5-S1, possible central disc protrusion. Moderate severe hypertrophic facet degenerative changes. No high-grade canal stenosis. IMPRESSION: 1. No acute osseous abnormality 2. Levo scoliosis with multilevel degenerative changes as described above. Electronically Signed   By: Luke Bun M.D.   On: 07/18/2024 19:42   CT ABDOMEN PELVIS W CONTRAST Result Date: 07/18/2024 CLINICAL DATA:  Lower abdominal pain with cramping EXAM: CT ABDOMEN AND PELVIS WITH CONTRAST TECHNIQUE: Multidetector CT imaging of the abdomen and pelvis was performed using the standard protocol following bolus administration of intravenous contrast.  RADIATION DOSE REDUCTION: This exam was performed according to the departmental dose-optimization program which includes automated exposure control, adjustment of the mA and/or kV according to patient size and/or use of iterative reconstruction technique. CONTRAST:  75mL OMNIPAQUE  IOHEXOL  300 MG/ML  SOLN COMPARISON:  CT 02/01/2024, 11/01/2023 FINDINGS: Lower chest: Lung bases demonstrate no acute airspace disease. Hepatobiliary:  No focal liver abnormality is seen. No gallstones, gallbladder wall thickening, or biliary dilatation. Pancreas: Unremarkable. No pancreatic ductal dilatation or surrounding inflammatory changes. Spleen: Normal in size without focal abnormality. Adrenals/Urinary Tract: Adrenal glands are normal. Subcentimeter hypodensities in the kidneys too small to further characterize, no imaging follow-up is recommended. Similar fullness of right renal pelvis without frank hydronephrosis. No obstructing stone. Small cystocele. Minimal thick-walled appearance of bladder with slight mucosal enhancement. Stomach/Bowel: The stomach is within normal limits. There is no dilated small bowel. Negative appendix. 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 Vascular/Lymphatic: Aortic atherosclerosis. No enlarged abdominal or pelvic lymph nodes. Reproductive: Status post hysterectomy. No adnexal masses. Other: Negative for ascites or free air Musculoskeletal: No acute osseous abnormality. 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 IMPRESSION: 1. Minimal thick-walled appearance of bladder with slight mucosal enhancement, correlate for cystitis. Fullness of the right renal pelvis without hydroureter or obstructing stone. 2. Diverticular disease of the left colon without acute inflammatory process. 3. Aortic atherosclerosis. Aortic Atherosclerosis (ICD10-I70.0). Electronically Signed   By: Luke Bun M.D.   On: 07/18/2024 19:34     Procedures    Medications Ordered in the ED  morphine  (PF) 2 MG/ML injection 2 mg (2 mg Intravenous Given 07/18/24 1917)  ondansetron  (ZOFRAN ) injection 4 mg (4 mg Intravenous Given 07/18/24 1912)  iohexol  (OMNIPAQUE ) 300 MG/ML solution 75 mL (75 mLs Intravenous Contrast Given 07/18/24 1848)  HYDROcodone -acetaminophen  (NORCO/VICODIN) 5-325 MG per tablet 1 tablet (1 tablet Oral Given 07/18/24 2036)  cephALEXin  (KEFLEX ) capsule 500 mg (500 mg Oral Given 07/18/24 2037)                                    Medical Decision Making Amount and/or Complexity of Data Reviewed Labs: ordered. Radiology: ordered.  Risk Prescription drug management.   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   Initial Ddx:  Cystitis, diverticulitis, appendicitis, lumbar radiculopathy, spinal cord compression, cauda equina  MDM/Course:  Patient resents emergency department with suprapubic pain.  Also is having some back pain as well.  Back pain appears to be more of a chronic symptom.  She does not have any red flags on her history.  Does not have any neurologic deficits distally or signs of cauda equina.  Does have some suprapubic tenderness to palpation on exam.  Did have a CT scan that showed possible cystitis.  Urinalysis with some bacteria so we will go ahead and treat her for this since it is likely causing her suprapubic pain.  Upon re-evaluation pain had improved.  Will give her first dose of Norco and Keflex  here.  Will have her follow-up with spine/[facility] for her back pain  This patient presents to the ED for concern of complaints listed in HPI, this involves an extensive number of treatment options, and is a complaint that carries with it a high risk of complications and morbidity. Disposition including potential need for admission considered.   Dispo: DC Home.  Return precautions discussed including, but not limited to, those listed in the AVS. Allowed pt time to ask questions which were answered fully prior to dc.  I have reviewed the patients home medications and made adjustments as needed Additional history obtained from daughter Records  reviewed Outpatient Clinic Notes The following labs were independently interpreted: Chemistry and show CKD I independently reviewed the following imaging with scope of interpretation limited to determining acute life threatening conditions related to emergency care: CT Abdomen/Pelvis and agree with the radiologist interpretation with the following exceptions: none I personally reviewed and interpreted cardiac monitoring: normal sinus rhythm  I personally reviewed and interpreted the pt's EKG: see above for interpretation  Social Determinants of health:  Geriatric  Portions of this note were generated with Scientist, clinical (histocompatibility and immunogenetics). Dictation errors may occur despite best attempts at proofreading.     Final diagnoses:  Acute midline low back pain, unspecified whether sciatica present  Cystitis    ED Discharge Orders          Ordered    cephALEXin  (KEFLEX ) 500 MG capsule  2 times daily        07/18/24 2022    HYDROcodone -acetaminophen  (NORCO/VICODIN) 5-325 MG tablet  Every 4 hours PRN        07/18/24 2022               Yolande Lamar BROCKS, MD 07/18/24 2156  "

## 2024-07-18 NOTE — ED Notes (Signed)
 Patient transported to CT

## 2024-07-18 NOTE — Discharge Instructions (Signed)
 You were seen for your lower back pain and urinary tract infection in the emergency department.   At home, please take the antibiotics we prescribed you.  Continue using lidocaine  patches for your back pain and you may also use the Norco we have prescribed you.  Please be aware this contains acetaminophen  so do not exceed the daily recommended dosage if you take Tylenol  in addition to this medication.    Check your MyChart online for the results of any tests that had not resulted by the time you left the emergency department.   Follow-up with your primary doctor in 2-3 days regarding your visit.  Follow-up with [facility] about your back and hip pain  Return immediately to the emergency department if you experience any of the following: Fevers, vomiting, leg weakness or numbness, or any other concerning symptoms.    Thank you for visiting our Emergency Department. It was a pleasure taking care of you today.

## 2024-07-18 NOTE — ED Triage Notes (Signed)
 Pt in with low abdominal cramping that radiates to back, onset this morning. Pt saw multiple specialists this week for various things - states one of the docs told her her chronic back pain was r/t hip problems and would not resolve without hip surgery. Pain is worse with movement, reports dizziness as well. Low grade temp 99.7

## 2024-07-20 ENCOUNTER — Encounter (HOSPITAL_COMMUNITY): Payer: Self-pay

## 2024-07-20 ENCOUNTER — Emergency Department (HOSPITAL_COMMUNITY)
Admission: EM | Admit: 2024-07-20 | Discharge: 2024-07-20 | Disposition: A | Discharge status: Home / Self Care | Attending: Emergency Medicine | Admitting: Emergency Medicine

## 2024-07-20 ENCOUNTER — Other Ambulatory Visit: Payer: Self-pay

## 2024-07-20 DIAGNOSIS — Z79899 Other long term (current) drug therapy: Secondary | ICD-10-CM | POA: Insufficient documentation

## 2024-07-20 DIAGNOSIS — M545 Low back pain, unspecified: Secondary | ICD-10-CM | POA: Insufficient documentation

## 2024-07-20 DIAGNOSIS — E119 Type 2 diabetes mellitus without complications: Secondary | ICD-10-CM | POA: Insufficient documentation

## 2024-07-20 DIAGNOSIS — M549 Dorsalgia, unspecified: Secondary | ICD-10-CM

## 2024-07-20 LAB — CBC WITH DIFFERENTIAL/PLATELET
Abs Immature Granulocytes: 0.05 K/uL (ref 0.00–0.07)
Basophils Absolute: 0 K/uL (ref 0.0–0.1)
Basophils Relative: 0 %
Eosinophils Absolute: 0.1 K/uL (ref 0.0–0.5)
Eosinophils Relative: 1 %
HCT: 32.4 % — ABNORMAL LOW (ref 36.0–46.0)
Hemoglobin: 10.4 g/dL — ABNORMAL LOW (ref 12.0–15.0)
Immature Granulocytes: 0 %
Lymphocytes Relative: 10 %
Lymphs Abs: 1.1 K/uL (ref 0.7–4.0)
MCH: 28.8 pg (ref 26.0–34.0)
MCHC: 32.1 g/dL (ref 30.0–36.0)
MCV: 89.8 fL (ref 80.0–100.0)
Monocytes Absolute: 1.1 K/uL — ABNORMAL HIGH (ref 0.1–1.0)
Monocytes Relative: 9 %
Neutro Abs: 9.1 K/uL — ABNORMAL HIGH (ref 1.7–7.7)
Neutrophils Relative %: 80 %
Platelets: 288 K/uL (ref 150–400)
RBC: 3.61 MIL/uL — ABNORMAL LOW (ref 3.87–5.11)
RDW: 12.6 % (ref 11.5–15.5)
WBC: 11.4 K/uL — ABNORMAL HIGH (ref 4.0–10.5)
nRBC: 0 % (ref 0.0–0.2)

## 2024-07-20 LAB — URINALYSIS, ROUTINE W REFLEX MICROSCOPIC
Bilirubin Urine: NEGATIVE
Glucose, UA: NEGATIVE mg/dL
Hgb urine dipstick: NEGATIVE
Ketones, ur: NEGATIVE mg/dL
Leukocytes,Ua: NEGATIVE
Nitrite: NEGATIVE
Protein, ur: 30 mg/dL — AB
Specific Gravity, Urine: 1.025 (ref 1.005–1.030)
pH: 6 (ref 5.0–8.0)

## 2024-07-20 LAB — URINALYSIS, MICROSCOPIC (REFLEX): Bacteria, UA: NONE SEEN

## 2024-07-20 LAB — COMPREHENSIVE METABOLIC PANEL WITH GFR
ALT: 18 U/L (ref 0–44)
AST: 20 U/L (ref 15–41)
Albumin: 4 g/dL (ref 3.5–5.0)
Alkaline Phosphatase: 97 U/L (ref 38–126)
Anion gap: 10 (ref 5–15)
BUN: 14 mg/dL (ref 8–23)
CO2: 25 mmol/L (ref 22–32)
Calcium: 9.4 mg/dL (ref 8.9–10.3)
Chloride: 100 mmol/L (ref 98–111)
Creatinine, Ser: 1.04 mg/dL — ABNORMAL HIGH (ref 0.44–1.00)
GFR, Estimated: 54 mL/min — ABNORMAL LOW
Glucose, Bld: 116 mg/dL — ABNORMAL HIGH (ref 70–99)
Potassium: 3.8 mmol/L (ref 3.5–5.1)
Sodium: 135 mmol/L (ref 135–145)
Total Bilirubin: 0.5 mg/dL (ref 0.0–1.2)
Total Protein: 7.1 g/dL (ref 6.5–8.1)

## 2024-07-20 LAB — URINE CULTURE: Culture: >=100000 — AB

## 2024-07-20 MED ORDER — ONDANSETRON HCL 4 MG PO TABS: 4.0000 mg | ORAL_TABLET | Freq: Four times a day (QID) | ORAL | 0 refills | Status: DC

## 2024-07-20 MED ORDER — HYDROCODONE-ACETAMINOPHEN 5-325 MG PO TABS
1.0000 | ORAL_TABLET | Freq: Once | ORAL | Status: AC
  Administered 2024-07-20: 1 via ORAL
  Filled 2024-07-20: qty 1

## 2024-07-20 MED ORDER — PREDNISONE 10 MG PO TABS: 40.0000 mg | ORAL_TABLET | Freq: Every day | ORAL | 0 refills | Status: AC

## 2024-07-20 MED ORDER — PREDNISONE 20 MG PO TABS
60.0000 mg | ORAL_TABLET | Freq: Once | ORAL | Status: AC
  Administered 2024-07-20: 60 mg via ORAL
  Filled 2024-07-20: qty 3

## 2024-07-20 MED ORDER — HYDROCODONE-ACETAMINOPHEN 5-325 MG PO TABS: 1.0000 | ORAL_TABLET | Freq: Four times a day (QID) | ORAL | 0 refills | Status: AC | PRN

## 2024-07-20 NOTE — ED Triage Notes (Signed)
 PT c/o lower back pain and reports she was diagnosed with a UTI. Was given medication but pain has been unrelieved. States she can't sit or or lay down.

## 2024-07-20 NOTE — ED Provider Notes (Signed)
 " [place] EMERGENCY DEPARTMENT AT [facility] Provider Note   CSN: [id number] Arrival date & time: 07/20/24  1112     Patient presents with: Urinary Tract Infection and Back Pain  HPI [name] is a 78 y.o. female with type II diabetes, chronic lumbar radiculopathy and hip pain presenting for back pain.  Has been going on for a couple weeks but worse in the last few days.  Was here a few days ago and diagnosed with a UTI and there was concern for sciatica as well.  Sent home with Keflex  and a few tablets of Norco.  States the pain is generally about her lower back.  Reports that her urinary symptoms have improved.  Denies abdominal pain.  Denies fever.  Denies saddle anesthesia.  Denies GI symptoms.    Urinary Tract Infection Back Pain      Prior to Admission medications  Medication Sig Start Date End Date Taking? Authorizing Provider  acetaminophen  (TYLENOL ) 500 MG tablet Take 2 tablets (1,000 mg total) by mouth 3 (three) times daily as needed. 03/07/23   Geofm Glade [name], MD  amLODipine  (NORVASC ) 5 MG tablet TAKE 1 TABLET (5 MG TOTAL) BY MOUTH DAILY. 12/19/23 12/13/24  Dunn, Dayna N, PA-C  ascorbic acid (VITAMIN C ) 1000 MG tablet Take by oral route.    [provider]  aspirin  81 MG tablet Take 81 mg by mouth daily.    [provider]  carvedilol  (COREG ) 3.125 MG tablet TAKE 1 TABLET BY MOUTH TWICE A DAY 03/18/24   Dunn, Dayna N, PA-C  cephALEXin  (KEFLEX ) 500 MG capsule Take 1 capsule (500 mg total) by mouth 2 (two) times daily for 7 days. 07/18/24 07/25/24  Yolande Lamar BROCKS, MD  Cholecalciferol (VITAMIN D -3) 25 MCG (1000 UT) CAPS Take 10,000 Units by mouth daily.     [provider]  cyclobenzaprine  (FLEXERIL ) 10 MG tablet Take 1 tablet (10 mg total) by mouth 2 (two) times daily as needed for muscle spasms. 02/01/24   Odell Balls, PA-C  diclofenac  Sodium (VOLTAREN ) 1 % GEL Apply topically 4 (four) times daily as needed.     [provider]  dicyclomine  (BENTYL ) 10 MG capsule TAKE 1 TO 2 TABLETS THREE TIMES DAILY AS NEEDED    [provider]  dicyclomine  (BENTYL ) 20 MG tablet Take 0.5 tablets (10 mg total) by mouth every 8 (eight) hours as needed for spasms (AB pain). 04/17/24   Craig Alan SAUNDERS, PA-C  EPINEPHrine  (EPIPEN  2-PAK) 0.3 mg/0.3 mL IJ SOAJ injection Use as directed for severe allergic reactions 05/12/24   Desai, Nikita S, MD  esomeprazole  (NEXIUM ) 40 MG capsule TAKE 1 CAPSULE (40 MG TOTAL) BY MOUTH DAILY. TAKE 30 MINUTES PRIOR TO A MEAL 06/23/24   Burns, Glade [name], MD  HYDROcodone  bit-homatropine [facility]) 5-1.5 MG/5ML syrup Take 5 mLs by mouth every 8 (eight) hours as needed for cough. 07/16/24   Geofm Glade [name], MD  HYDROcodone -acetaminophen  (NORCO/VICODIN) 5-325 MG tablet Take 1 tablet by mouth every 4 (four) hours as needed. 07/18/24   Yolande Lamar BROCKS, MD  MAGNESIUM  GLYCINATE PO Take 240 mg by mouth daily.    [provider]  meclizine  (ANTIVERT ) 12.5 MG tablet Take 1 tablet (12.5 mg total) by mouth 3 (three) times daily as needed for dizziness. 03/24/24   Geofm Glade [name], MD  Omega-3 Fatty Acids (FISH OIL BURP-LESS) 1000 MG CAPS Takes 1280 mg 2 caps daiily 01/03/16   Esterwood, Amy S, PA-C  ondansetron  (  ZOFRAN -ODT) 4 MG disintegrating tablet Take 1 tablet (4 mg total) by mouth every 8 (eight) hours as needed for nausea or vomiting. 02/01/24   Odell Balls, PA-C  predniSONE  (DELTASONE ) 10 MG tablet Take 4 tabs po qd x 3 days, then 3 tabs po qd x 3 days, then 2 tabs po qd x 3 days, then 1 tab po qd x 3 days 05/30/24   Geofm Glade PARAS, MD  rosuvastatin  (CRESTOR ) 5 MG tablet Take 1 tablet (5 mg total) by mouth daily. 01/21/24   Geofm Glade PARAS, MD  Ubiquinol 100 MG CAPS Take 1 tablet by mouth daily.    [provider]  vitamin B-12 (CYANOCOBALAMIN ) 1000 MCG tablet Take 1,000 mcg by mouth 2 (two) times daily.     [provider]  vitamin E 400 UNIT capsule Take 400 Units by mouth  daily.    [provider]  zinc  gluconate 50 MG tablet Take 50 mg by mouth daily.    [provider]    Allergies: Bactrim [sulfamethoxazole-trimethoprim], Pentosan polysulfate, Pentosan polysulfate sodium, Promethazine hcl, Sulfamethoxazole-trimethoprim, Lisinopril, Losartan , Pork (diagnostic), and Promethazine hcl    Review of Systems  Musculoskeletal:  Positive for back pain.    Physical Exam   Vitals:   07/20/24 1118  BP: (!) 144/69  Pulse: 67  Resp: 18  Temp: 98.4 F (36.9 C)  SpO2: 98%    CONSTITUTIONAL:  well-appearing, NAD NEURO:  Alert and oriented x 3, CN 3-12 grossly intact EYES:  eyes equal and reactive ENT/NECK:  Supple, no stridor  CARDIO:  regular rate and rhythm, appears well-perfused, pedal pulses 2+ bilaterally PULM:  No respiratory distress, CTAB GI/GU:  non-distended, soft, non tender MSK/SPINE:  No gross deformities, no edema, moves all extremities.  Active range of motion of the back and both hips are grossly normal.  Ambulatory and with steady gait.  General tenderness with palpation of the lower back.  Extremity sensation and strength are symmetric. SKIN:  no rash, atraumatic  *Additional and/or pertinent findings included in MDM below  (all labs ordered are listed, but only abnormal results are displayed) Labs Reviewed  COMPREHENSIVE METABOLIC PANEL WITH GFR - Abnormal; Notable for the following components:      Result Value   Glucose, Bld 116 (*)    Creatinine, Ser 1.04 (*)    GFR, Estimated 54 (*)    All other components within normal limits  CBC WITH DIFFERENTIAL/PLATELET - Abnormal; Notable for the following components:   WBC 11.4 (*)    RBC 3.61 (*)    Hemoglobin 10.4 (*)    HCT 32.4 (*)    Neutro Abs 9.1 (*)    Monocytes Absolute 1.1 (*)    All other components within normal limits  URINALYSIS, ROUTINE W REFLEX MICROSCOPIC - Abnormal; Notable for the following components:   Protein, ur 30 (*)    All other components  within normal limits  URINALYSIS, MICROSCOPIC (REFLEX)    EKG: None  Radiology: CT L-SPINE NO CHARGE Result Date: 07/18/2024 CLINICAL DATA:  Low abdominal cramping back pain EXAM: CT Lumbar Spine without contrast TECHNIQUE: Technique: Multiplanar CT images of the lumbar spine were reconstructed from contemporary CT of the Abdomen and Pelvis. RADIATION DOSE REDUCTION: This exam was performed according to the departmental dose-optimization program which includes automated exposure control, adjustment of the mA and/or kV according to patient size and/or use of iterative reconstruction technique. CONTRAST:  None or No additional COMPARISON:  None FINDINGS: Segmentation: 5 lumbar type  vertebrae. Alignment: Levoscoliosis.  Trace grade 1 anterolisthesis L4 on L5. Vertebrae: No acute fracture or focal pathologic process. Paraspinal and other soft tissues: No acute finding Disc levels: At T12-L1, disc space narrowing and vacuum disc. No high-grade canal stenosis. Patent foramen. At L1-L2, disc space narrowing. No high-grade canal stenosis. Mild facet degenerative changes. Patent foramen. At L2-L3, disc space narrowing and vacuum disc. Mild facet degenerative changes. Mild diffuse disc bulge. No high-grade canal stenosis. No high-grade foraminal narrowing. At L3-L4, diffuse disc bulge. Moderate hypertrophic facet degenerative changes and ligamentum flavum thickening. Suspected mild canal stenosis. At L4-L5, mild disc space narrowing. Diffuse disc bulge. Moderate severe facet degenerative changes. Suspicion of moderate canal stenosis. Mild left foraminal narrowing. At L5-S1, possible central disc protrusion. Moderate severe hypertrophic facet degenerative changes. No high-grade canal stenosis. IMPRESSION: 1. No acute osseous abnormality 2. Levo scoliosis with multilevel degenerative changes as described above. Electronically Signed   By: Luke Bun M.D.   On: 07/18/2024 19:42   CT ABDOMEN PELVIS W  CONTRAST Result Date: 07/18/2024 CLINICAL DATA:  Lower abdominal pain with cramping EXAM: CT ABDOMEN AND PELVIS WITH CONTRAST TECHNIQUE: Multidetector CT imaging of the abdomen and pelvis was performed using the standard protocol following bolus administration of intravenous contrast. RADIATION DOSE REDUCTION: This exam was performed according to the departmental dose-optimization program which includes automated exposure control, adjustment of the mA and/or kV according to patient size and/or use of iterative reconstruction technique. CONTRAST:  75mL OMNIPAQUE  IOHEXOL  300 MG/ML  SOLN COMPARISON:  CT 02/01/2024, 11/01/2023 FINDINGS: Lower chest: Lung bases demonstrate no acute airspace disease. Hepatobiliary: No focal liver abnormality is seen. No gallstones, gallbladder wall thickening, or biliary dilatation. Pancreas: Unremarkable. No pancreatic ductal dilatation or surrounding inflammatory changes. Spleen: Normal in size without focal abnormality. Adrenals/Urinary Tract: Adrenal glands are normal. Subcentimeter hypodensities in the kidneys too small to further characterize, no imaging follow-up is recommended. Similar fullness of right renal pelvis without frank hydronephrosis. No obstructing stone. Small cystocele. Minimal thick-walled appearance of bladder with slight mucosal enhancement. Stomach/Bowel: The stomach is within normal limits. There is no dilated small bowel. Negative appendix. 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 Vascular/Lymphatic: Aortic atherosclerosis. No enlarged abdominal or pelvic lymph nodes. Reproductive: Status post hysterectomy. No adnexal masses. Other: Negative for ascites or free air Musculoskeletal: No acute osseous abnormality. 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 IMPRESSION: 1. Minimal thick-walled appearance of bladder with slight mucosal enhancement, correlate for cystitis. Fullness of the right renal  pelvis without hydroureter or obstructing stone. 2. Diverticular disease of the left colon without acute inflammatory process. 3. Aortic atherosclerosis. Aortic Atherosclerosis (ICD10-I70.0). Electronically Signed   By: Luke Bun M.D.   On: 07/18/2024 19:34     Procedures   Medications Ordered in the ED  predniSONE  (DELTASONE ) tablet 60 mg (has no administration in time range)  HYDROcodone -acetaminophen  (NORCO/VICODIN) 5-325 MG per tablet 1 tablet (has no administration in time range)                                    Medical Decision Making  78 year old well-appearing female presenting for back pain.  Exam notable for generalized lower back tenderness but otherwise reassuring.  Noted to be ambulatory with steady gait here.  Urinalysis appears improved in comparison to last 2 days ago along with her symptoms.  She does not have CVA tenderness  or abdominal pain does feel the pain is likely isolated in her back.  CT of the lumbar spine did show degenerative changes.  Suspect this is chronic pain.  She states she has taken steroids in the past which have helped her pain.  Started her on a 5-day course here.  Advised her to continue taking the Norco as prescribed to her.  Advised neurosurgery follow-up outpatient.  Discussed return precautions.  Discharged in good condition.     Final diagnoses:  Back pain, unspecified back location, unspecified back pain laterality, unspecified chronicity    ED Discharge Orders     None          Lang Norleen POUR, PA-C 07/20/24 1400  "

## 2024-07-20 NOTE — ED Provider Triage Note (Signed)
 Emergency Medicine Provider Triage Evaluation Note  [name] , a 78 y.o. female  was evaluated in triage.  Pt complains of back pain. Pain across lower back ongoing for the past few days, unrelieved with lidoderm  patch and abx recent prescribed several days prior.  No fever, dysuria, leg numbness/weakness.  No bowel/bladder incontinence or saddle anesthesia  Review of Systems  Positive: As above Negative: As above  Physical Exam  BP (!) 144/69 (BP Location: Right Arm)   Pulse 67   Temp 98.4 F (36.9 C)   Resp 18   SpO2 98%  Gen:   Awake, no distress   Resp:  Normal effort  MSK:   Moves extremities without difficulty  Other:    Medical Decision Making  Medically screening exam initiated at 11:56 AM.  Appropriate orders placed.  BERNADEAN SALING was informed that the remainder of the evaluation will be completed by another provider, this initial triage assessment does not replace that evaluation, and the importance of remaining in the ED until their evaluation is complete.     Nivia Colon, PA-C 07/20/24 1157

## 2024-07-20 NOTE — Discharge Instructions (Addendum)
 Evaluation today was overall reassuring but feel that you need follow-up with the spine doctors.  If you have any worsening symptoms, saddle numbness, urinary or bowel incontinence or retention or develop a fever or any other concerning symptom please return to ED for further evaluation.  Also recommend follow-up your PCP discussed pain management as well.

## 2024-07-21 ENCOUNTER — Telehealth (HOSPITAL_BASED_OUTPATIENT_CLINIC_OR_DEPARTMENT_OTHER): Payer: Self-pay | Admitting: *Deleted

## 2024-07-21 NOTE — Telephone Encounter (Signed)
 Post ED Visit - Positive Culture Follow-up  Culture report reviewed by antimicrobial stewardship pharmacist: [facility] [x]  Powell Blush, Pharm.D. []  Venetia Gully, Pharm.D., BCPS AQ-ID []  Garrel Crews, Pharm.D., BCPS []  Almarie Lunger, [address].D., BCPS []  [place], [address].D., BCPS, AAHIVP []  Rosaline Bihari, Pharm.D., BCPS, AAHIVP []  Vernell Meier, PharmD, BCPS []  Latanya Hint, PharmD, BCPS []  Donald Medley, PharmD, BCPS []  Rocky Bold, PharmD []  Dorothyann Alert, PharmD, BCPS []  Morene Babe, PharmD  [facility] []  Rosaline Edison, PharmD []  Romona Bliss, PharmD []  Dolphus Roller, PharmD []  Veva Seip, Rph []  Vernell Daunt) [name], PharmD []  Eva Allis, PharmD []  Rosaline Millet, PharmD []  Iantha Batch, PharmD []  Arvin Gauss, PharmD []  Wanda Hasting, PharmD []  Ronal Rav, PharmD []  Rocky Slade, PharmD []  Bard Jeans, PharmD   Positive urine culture Treated with Cephalexin , organism sensitive to the same and no further patient follow-up is required at this time.  [name] 07/21/2024, 10:43 AM

## 2024-07-22 ENCOUNTER — Ambulatory Visit: Admitting: Internal Medicine

## 2024-07-22 ENCOUNTER — Encounter: Payer: Self-pay | Admitting: Internal Medicine

## 2024-07-22 VITALS — BP 110/78 | HR 60 | Ht 62.0 in | Wt 157.0 lb

## 2024-07-22 DIAGNOSIS — Z8719 Personal history of other diseases of the digestive system: Secondary | ICD-10-CM

## 2024-07-22 DIAGNOSIS — T402X5A Adverse effect of other opioids, initial encounter: Secondary | ICD-10-CM

## 2024-07-22 DIAGNOSIS — K219 Gastro-esophageal reflux disease without esophagitis: Secondary | ICD-10-CM | POA: Diagnosis not present

## 2024-07-22 DIAGNOSIS — Z860101 Personal history of adenomatous and serrated colon polyps: Secondary | ICD-10-CM | POA: Diagnosis not present

## 2024-07-22 DIAGNOSIS — K582 Mixed irritable bowel syndrome: Secondary | ICD-10-CM

## 2024-07-22 DIAGNOSIS — K589 Irritable bowel syndrome without diarrhea: Secondary | ICD-10-CM

## 2024-07-22 DIAGNOSIS — K5903 Drug induced constipation: Secondary | ICD-10-CM

## 2024-07-22 NOTE — Progress Notes (Signed)
" ° °  Subjective:    Patient ID: [name], female    DOB: Apr 09, 1946, 78 y.o.   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  Constipation has persisted for five days, which she attributes to hydrocodone  and prednisone  use.  No bowel movement in 5 days.  She denies abdominal pain. Previously, bowel movements were regular and she had not started Metamucil, as prior constipation and diarrhea had resolved. Recent decreased food intake may also be contributing.  Current medications include prednisone  (recent course), hydrocodone , and daily Nexium .  She has been prescribed meloxicam  to use after completing her course of prednisone  and uses lidocaine  patches for pain. She has samples of Reflux Gourmet for GERD/indigestion and is considering starting Miralax for constipation, though she is unsure of dosing for some medications.  GERD and indigestion symptoms persist, managed with daily Nexium  and intermittent Reflux Gourmet. She is unsure of dosing instructions for Reflux Gourmet but has samples available.  Arthritis-related pain continues, with recent sharp pain in the hip and back causing difficulty walking. Pain is managed with hydrocodone  and lidocaine  patches.  Significant stress due to caregiving responsibilities for a family member undergoing chemotherapy for AML and transfusions has impacted her overall well-being.   Review of Systems As per HPI, otherwise negative  Current Medications, Allergies, Past Medical History, Past Surgical History, Family History and Social History were reviewed in [employer] record.    Objective:   Physical Exam BP 110/78   Pulse 60   Ht 5' 2 (1.575 m)   Wt 157 lb (71.2 kg)   BMI 28.72 kg/m  Gen: awake, alert, NAD HEENT:  anicteric  Neuro: nonfocal     Assessment & Plan:   Opioid-induced constipation Opioid-induced constipation due to hydrocodone  use, asymptomatic but at risk for complications if untreated. - Initiated Miralax, 2 doses daily until bowel movements resume. - Advised reduction to 1 dose daily once regular bowel movements are established and hydrocodone  continues. - Provided anticipatory guidance regarding initial hard stools and difficulty with passage. - Instructed her to contact the clinic if Miralax is ineffective for consideration of alternative therapies. - Emphasized prevention of complications including diverticulitis and fecal impaction.  IBS with hx of fecal incontinence - She has not yet tried Metamucil but see 1 - Consider pelvic floor physical therapy in the future  Gastroesophageal reflux disease GERD symptoms managed with Nexium  40 mg and Reflux Gourmet, with gastric protection needed due to meloxicam  use. - Continued Nexium  40 mg for gastric protection, particularly with concurrent meloxicam  use. - Discussed Reflux Gourmet as a safe, over-the-counter option for management of indigestion and GERD symptoms.  Colonic diverticulosis/diverticulitis April 2025 -Resolved with antibiotics  History of colonic polyps, multiple adenomas -Consider repeat colonoscopy based on overall health March 2028  March follow-up with me  30 minutes total spent today including patient facing time, coordination of care, reviewing medical history/procedures/pertinent radiology studies, and documentation of the encounter.  "

## 2024-07-22 NOTE — Patient Instructions (Signed)
 Take Miralax twice daily and once your bowels start moving, then you can reduce to once daily.   Continue Nexium  daily.   We will contact you once our March schedule is available.   _______________________________________________________  If your blood pressure at your visit was 140/90 or greater, please contact your primary care physician to follow up on this.  _______________________________________________________  If you are age 80 or older, your body mass index should be between 23-30. Your Body mass index is 28.72 kg/m. If this is out of the aforementioned range listed, please consider follow up with your Primary Care Provider.  If you are age 31 or younger, your body mass index should be between 19-25. Your Body mass index is 28.72 kg/m. If this is out of the aformentioned range listed, please consider follow up with your Primary Care Provider.   ________________________________________________________  The [facility] providers would like to encourage you to use MYCHART to communicate with providers for non-urgent requests or questions.  Due to long hold times on the telephone, sending your provider a message by [facility] may be a faster and more efficient way to get a response.  Please allow 48 business hours for a response.  Please remember that this is for non-urgent requests.  _______________________________________________________  [facility] is using a team-based approach to care.  Your team is made up of your doctor and two to three APPS. Our APPS (Nurse Practitioners and Physician Assistants) work with your physician to ensure care continuity for you. They are fully qualified to address your health concerns and develop a treatment plan. They communicate directly with your gastroenterologist to care for you. Seeing the Advanced Practice Practitioners on your physician's team can help you by facilitating care more promptly, often allowing for earlier appointments,  access to diagnostic testing, procedures, and other specialty referrals.

## 2024-07-25 ENCOUNTER — Telehealth: Payer: Self-pay | Admitting: Internal Medicine

## 2024-07-25 DIAGNOSIS — J849 Interstitial pulmonary disease, unspecified: Secondary | ICD-10-CM

## 2024-07-25 NOTE — Telephone Encounter (Signed)
 ILD patiet. Last seen by Dr Alroy. Was discussed in conference. No followup present. Please schedule with me - 30 min . First avail fine. Please get spiro/dlco done

## 2024-07-28 NOTE — Telephone Encounter (Signed)
 Attempted to call patient. Mailbox was full. Sent MyChart message for patient to call our office to get scheduled for PFT and appointment.

## 2024-07-28 NOTE — Telephone Encounter (Signed)
 ATC x1. LMTCB to schedule PFT & office visit.   MR has soonest opening 1/20 for 30 min slot.  PFT has 60 min opening at [facility] 1/15.  Front staff please schedule full PFT and 30 min OV with Dr. Geronimo

## 2024-08-06 LAB — HM MAMMOGRAPHY

## 2024-08-11 ENCOUNTER — Encounter: Payer: Self-pay | Admitting: Internal Medicine

## 2024-08-19 NOTE — Progress Notes (Signed)
 COVID and flu ordered to test for infection

## 2024-08-20 ENCOUNTER — Encounter: Payer: Self-pay | Admitting: Internal Medicine

## 2024-08-24 ENCOUNTER — Encounter: Payer: Self-pay | Admitting: Internal Medicine

## 2024-08-24 NOTE — Progress Notes (Unsigned)
 "   Subjective:    Patient ID: [name], female    DOB: 19-Oct-1945, 79 y.o.   MRN: [id number]      HPI [name] is here for No chief complaint on file.        Medications and allergies reviewed with patient and updated if appropriate.  Medications Ordered Prior to Encounter[1]  Review of Systems     Objective:  There were no vitals filed for this visit. BP Readings from Last 3 Encounters:  07/22/24 110/78  07/20/24 (!) 144/69  07/18/24 117/61   Wt Readings from Last 3 Encounters:  07/22/24 157 lb (71.2 kg)  07/18/24 154 lb (69.9 kg)  07/16/24 154 lb (69.9 kg)   There is no height or weight on file to calculate BMI.    Physical Exam         Assessment & Plan:    See Problem List for Assessment and Plan of chronic medical problems.           [1] Current Outpatient Medications on File Prior to Visit  Medication Sig Dispense Refill   acetaminophen  (TYLENOL ) 500 MG tablet Take 2 tablets (1,000 mg total) by mouth 3 (three) times daily as needed.     amLODipine  (NORVASC ) 5 MG tablet TAKE 1 TABLET (5 MG TOTAL) BY MOUTH DAILY. 90 tablet 3   ascorbic acid (VITAMIN C ) 1000 MG tablet Take by oral route.     aspirin  81 MG tablet Take 81 mg by mouth daily.     carvedilol  (COREG ) 3.125 MG tablet TAKE 1 TABLET BY MOUTH TWICE A DAY 180 tablet 1   Cholecalciferol (VITAMIN D -3) 25 MCG (1000 UT) CAPS Take 10,000 Units by mouth daily.      cyclobenzaprine  (FLEXERIL ) 10 MG tablet Take 1 tablet (10 mg total) by mouth 2 (two) times daily as needed for muscle spasms. 14 tablet 0   diclofenac  Sodium (VOLTAREN ) 1 % GEL Apply topically 4 (four) times daily as needed.      dicyclomine  (BENTYL ) 20 MG tablet Take 0.5 tablets (10 mg total) by mouth every 8 (eight) hours as needed for spasms (AB pain). 30 tablet 0   EPINEPHrine  (EPIPEN  2-PAK) 0.3 mg/0.3 mL IJ SOAJ injection Use as directed for severe allergic reactions 2 each 1   esomeprazole  (NEXIUM ) 40 MG capsule  TAKE 1 CAPSULE (40 MG TOTAL) BY MOUTH DAILY. TAKE 30 MINUTES PRIOR TO A MEAL 90 capsule 1   HYDROcodone  bit-homatropine (HYCODAN) 5-1.5 MG/5ML syrup Take 5 mLs by mouth every 8 (eight) hours as needed for cough. 120 mL 0   HYDROcodone -acetaminophen  (NORCO/VICODIN) 5-325 MG tablet Take 1 tablet by mouth every 6 (six) hours as needed. 10 tablet 0   MAGNESIUM  GLYCINATE PO Take 240 mg by mouth daily.     meclizine  (ANTIVERT ) 12.5 MG tablet Take 1 tablet (12.5 mg total) by mouth 3 (three) times daily as needed for dizziness. 30 tablet 0   Omega-3 Fatty Acids (FISH OIL BURP-LESS) 1000 MG CAPS Takes 1280 mg 2 caps daiily 60 capsule 0   ondansetron  (ZOFRAN -ODT) 4 MG disintegrating tablet Take 1 tablet (4 mg total) by mouth every 8 (eight) hours as needed for nausea or vomiting. 20 tablet 0   rosuvastatin  (CRESTOR ) 5 MG tablet Take 1 tablet (5 mg total) by mouth daily. 90 tablet 3   Ubiquinol 100 MG CAPS Take 1 tablet by mouth daily.     vitamin B-12 (CYANOCOBALAMIN ) 1000 MCG tablet Take 1,000 mcg by mouth 2 (two)  times daily.      vitamin E 400 UNIT capsule Take 400 Units by mouth daily.     zinc  gluconate 50 MG tablet Take 50 mg by mouth daily.     No current facility-administered medications on file prior to visit.  "

## 2024-08-27 ENCOUNTER — Ambulatory Visit: Admitting: Internal Medicine

## 2024-08-27 ENCOUNTER — Telehealth: Admitting: Internal Medicine

## 2024-08-27 ENCOUNTER — Encounter: Payer: Self-pay | Admitting: Internal Medicine

## 2024-08-27 DIAGNOSIS — M2550 Pain in unspecified joint: Secondary | ICD-10-CM | POA: Diagnosis not present

## 2024-08-27 DIAGNOSIS — D649 Anemia, unspecified: Secondary | ICD-10-CM | POA: Diagnosis not present

## 2024-08-27 DIAGNOSIS — R61 Generalized hyperhidrosis: Secondary | ICD-10-CM

## 2024-08-27 DIAGNOSIS — E1159 Type 2 diabetes mellitus with other circulatory complications: Secondary | ICD-10-CM | POA: Diagnosis not present

## 2024-08-27 DIAGNOSIS — I152 Hypertension secondary to endocrine disorders: Secondary | ICD-10-CM

## 2024-08-27 NOTE — Assessment & Plan Note (Signed)
 Chronic She definitely has osteoarthritis and ankylosing spondylitis ?  Rheumatological disease or not-has seen rheumatology in the past and there were some differing opinions Discussed that if she only has osteoarthritis he would be following with pain management through orthopedics and seeing individual orthopedics or specific joints I think she needs clarification if this is purely osteoarthritis versus a combination Referral to rheumatology

## 2024-08-27 NOTE — Progress Notes (Signed)
 Virtual Visit via Video Note  I connected with [name] on 08/27/24 at  2:00 PM EST by a video enabled telemedicine application and verified that I am speaking with the correct person using two identifiers.   I discussed the limitations of evaluation and management by telemedicine and the availability of in person appointments. The patient expressed understanding and agreed to proceed.  Present for the visit:  Myself, Dr Glade Hope, [place] Riding.  The patient is currently at home and I am in the office.    No referring provider.    History of Present Illness: This is an acute visit for painful joints - pain is worse and diffuse, she has occ nausea and take zofran  prn, always tired, cold all the time, night sweats.  May occasionally have a hot feeling during the day, but not as severe as her night sweats.    Left hip severe osteoarthritis-needs replacement -she is seeing pain management and was prescribed percocet 5-325 mg - it helps.  She needs to take it in order to be able to function.  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     She would like to see someone about her joint pain and night sweats.     Social History   Socioeconomic History   Marital status: Married    Spouse name: Joe   Number of children: 1   Years of education: BS   Highest education level: Not on file  Occupational History   Occupation: retired   Occupation: runner, broadcasting/film/video    Comment: Kindergarten  Tobacco Use   Smoking status: Never    Passive exposure: Past   Smokeless tobacco: Never  Vaping Use   Vaping status: Never Used  Substance and Sexual Activity   Alcohol use: Yes    Comment: rare   Drug use: No   Sexual activity: Not Currently  Other Topics Concern   Not on file  Social History Narrative   Lives at home with husband, is his caregiver   No daily use of caffeine.   Right-handed.         Social Drivers of Health   Tobacco Use: Low  Risk (08/24/2024)   Patient History    Smoking Tobacco Use: Never    Smokeless Tobacco Use: Never    Passive Exposure: Past  Financial Resource Strain: Low Risk (08/31/2023)   Overall Financial Resource Strain (CARDIA)    Difficulty of Paying Living Expenses: Not hard at all  Food Insecurity: No Food Insecurity (08/31/2023)   Hunger Vital Sign    Worried About Running Out of Food in the Last Year: Never true    Ran Out of Food in the Last Year: Never true  Transportation Needs: No Transportation Needs (08/31/2023)   PRAPARE - Administrator, Civil Service (Medical): No    Lack of Transportation (Non-Medical): No  Physical Activity: Insufficiently Active (08/31/2023)   Exercise Vital Sign    Days of Exercise per Week: 2 days    Minutes of Exercise per Session: 20 min  Stress: Stress Concern Present (08/31/2023)   [employer] of Occupational Health - Occupational Stress Questionnaire    Feeling of Stress : To some extent  Social Connections: Moderately Isolated (08/31/2023)   Social Connection and Isolation Panel    Frequency of Communication with Friends and Family: More than three times a week    Frequency of Social Gatherings with Friends and  Family: Never    Attends Religious Services: Never    Active Member of Clubs or Organizations: No    Attends Banker Meetings: Never    Marital Status: Married  Depression (PHQ2-9): Low Risk (05/30/2024)   Depression (PHQ2-9)    PHQ-2 Score: 4  Alcohol Screen: Low Risk (08/31/2023)   Alcohol Screen    Last Alcohol Screening Score (AUDIT): 0  Housing: Unknown (08/31/2023)   Housing Stability Vital Sign    Unable to Pay for Housing in the Last Year: No    Number of Times Moved in the Last Year: Not on file    Homeless in the Last Year: No  Utilities: Not At Risk (08/31/2023)   [employer] Utilities    Threatened with loss of utilities: No  Health Literacy: Adequate Health Literacy (08/31/2023)   B1300 Health Literacy     Frequency of need for help with medical instructions: Never     Observations/Objective: Appears well in NAD Breathing normally Skin appears warm and dry  Assessment and Plan:  See Problem List for Assessment and Plan of chronic medical problems.   Follow Up Instructions:    I discussed the assessment and treatment plan with the patient. The patient was provided an opportunity to ask questions and all were answered. The patient agreed with the plan and demonstrated an understanding of the instructions.   The patient was advised to call back or seek an in-person evaluation if the symptoms worsen or if the condition fails to improve as anticipated.    Glade [name] Hope, MD

## 2024-08-27 NOTE — Assessment & Plan Note (Signed)
 Subacute Has gotten slightly worse Iron levels low normal, ferritin normal, folate level normal, B12 level normal ?  Anemia of chronic disease versus other Referral to heme-onc for further evaluation

## 2024-08-27 NOTE — Assessment & Plan Note (Signed)
 Chronic Blood pressure has been well-controlled She is not monitoring her BP at home-encouraged her to monitor her BP at home Could try decreasing amlodipine  to 2.5 mg to see if that helps improve night sweats at all-not a common side effect but it is possible

## 2024-08-27 NOTE — Assessment & Plan Note (Signed)
 Chronic Has been experiencing night sweats for a while On occasion during the day she will feel slightly warm, but nothing like she is having at night Night sweats are fairly severe requiring changing She is requesting to see endocrine to make sure there is not something hormonal Have not been able to identify a cause Discussed possibly related to amlodipine -advised monitoring her BP and trying to decrease dose to 2.5 mg daily to see if that helps the night sweats Referral to endocrine ordered

## 2024-08-27 NOTE — Patient Instructions (Addendum)
 SABRA

## 2024-09-03 ENCOUNTER — Encounter

## 2024-09-04 ENCOUNTER — Ambulatory Visit: Payer: Medicare Other

## 2024-09-05 ENCOUNTER — Ambulatory Visit

## 2024-09-11 ENCOUNTER — Ambulatory Visit: Admitting: Internal Medicine

## 2024-09-30 ENCOUNTER — Inpatient Hospital Stay: Admitting: Hematology

## 2024-09-30 ENCOUNTER — Inpatient Hospital Stay

## 2024-10-13 ENCOUNTER — Ambulatory Visit: Admitting: Internal Medicine

## 2024-10-27 ENCOUNTER — Ambulatory Visit: Admitting: Physician Assistant

## 2024-11-17 ENCOUNTER — Ambulatory Visit: Admitting: Physician Assistant
